# Patient Record
Sex: Female | Born: 1975 | Race: Black or African American | Hispanic: No | Smoking: Former smoker
Health system: Southern US, Community
[De-identification: ages and names within clinical notes are randomized; demographics above are authoritative.]

## PROBLEM LIST (undated history)

## (undated) DIAGNOSIS — H269 Unspecified cataract: Secondary | ICD-10-CM

## (undated) DIAGNOSIS — M25571 Pain in right ankle and joints of right foot: Secondary | ICD-10-CM

## (undated) DIAGNOSIS — E282 Polycystic ovarian syndrome: Secondary | ICD-10-CM

## (undated) DIAGNOSIS — K5900 Constipation, unspecified: Secondary | ICD-10-CM

## (undated) DIAGNOSIS — J45901 Unspecified asthma with (acute) exacerbation: Secondary | ICD-10-CM

## (undated) DIAGNOSIS — T4145XA Adverse effect of unspecified anesthetic, initial encounter: Secondary | ICD-10-CM

## (undated) DIAGNOSIS — Z9989 Dependence on other enabling machines and devices: Principal | ICD-10-CM

## (undated) DIAGNOSIS — T8859XA Other complications of anesthesia, initial encounter: Secondary | ICD-10-CM

## (undated) DIAGNOSIS — R600 Localized edema: Secondary | ICD-10-CM

## (undated) DIAGNOSIS — K219 Gastro-esophageal reflux disease without esophagitis: Secondary | ICD-10-CM

## (undated) DIAGNOSIS — M25511 Pain in right shoulder: Secondary | ICD-10-CM

## (undated) DIAGNOSIS — R739 Hyperglycemia, unspecified: Secondary | ICD-10-CM

## (undated) DIAGNOSIS — R51 Headache: Secondary | ICD-10-CM

## (undated) DIAGNOSIS — H35039 Hypertensive retinopathy, unspecified eye: Secondary | ICD-10-CM

## (undated) DIAGNOSIS — M25551 Pain in right hip: Secondary | ICD-10-CM

## (undated) DIAGNOSIS — J4551 Severe persistent asthma with (acute) exacerbation: Secondary | ICD-10-CM

## (undated) DIAGNOSIS — M1990 Unspecified osteoarthritis, unspecified site: Secondary | ICD-10-CM

## (undated) DIAGNOSIS — N920 Excessive and frequent menstruation with regular cycle: Secondary | ICD-10-CM

## (undated) DIAGNOSIS — Z6841 Body Mass Index (BMI) 40.0 and over, adult: Secondary | ICD-10-CM

## (undated) DIAGNOSIS — G4733 Obstructive sleep apnea (adult) (pediatric): Secondary | ICD-10-CM

## (undated) DIAGNOSIS — Z8489 Family history of other specified conditions: Secondary | ICD-10-CM

## (undated) DIAGNOSIS — M069 Rheumatoid arthritis, unspecified: Secondary | ICD-10-CM

## (undated) DIAGNOSIS — A6000 Herpesviral infection of urogenital system, unspecified: Secondary | ICD-10-CM

## (undated) DIAGNOSIS — E662 Morbid (severe) obesity with alveolar hypoventilation: Secondary | ICD-10-CM

## (undated) DIAGNOSIS — E11319 Type 2 diabetes mellitus with unspecified diabetic retinopathy without macular edema: Secondary | ICD-10-CM

## (undated) DIAGNOSIS — M545 Low back pain: Secondary | ICD-10-CM

## (undated) DIAGNOSIS — L853 Xerosis cutis: Secondary | ICD-10-CM

## (undated) DIAGNOSIS — J302 Other seasonal allergic rhinitis: Secondary | ICD-10-CM

## (undated) DIAGNOSIS — G5601 Carpal tunnel syndrome, right upper limb: Secondary | ICD-10-CM

## (undated) HISTORY — DX: Hypertensive retinopathy, unspecified eye: H35.039

## (undated) HISTORY — DX: Polycystic ovarian syndrome: E28.2

## (undated) HISTORY — DX: Type 2 diabetes mellitus with unspecified diabetic retinopathy without macular edema: E11.319

## (undated) HISTORY — DX: Xerosis cutis: L85.3

## (undated) HISTORY — DX: Pain in right shoulder: M25.511

## (undated) HISTORY — DX: Morbid (severe) obesity due to excess calories: E66.01

## (undated) HISTORY — DX: Excessive and frequent menstruation with regular cycle: N92.0

## (undated) HISTORY — DX: Obstructive sleep apnea (adult) (pediatric): G47.33

## (undated) HISTORY — DX: Headache: R51

## (undated) HISTORY — DX: Dependence on other enabling machines and devices: Z99.89

## (undated) HISTORY — DX: Herpesviral infection of urogenital system, unspecified: A60.00

## (undated) HISTORY — DX: Rheumatoid arthritis, unspecified: M06.9

## (undated) HISTORY — DX: Low back pain: M54.5

## (undated) HISTORY — DX: Unspecified asthma with (acute) exacerbation: J45.901

## (undated) HISTORY — DX: Gastro-esophageal reflux disease without esophagitis: K21.9

## (undated) HISTORY — DX: Body Mass Index (BMI) 40.0 and over, adult: Z684

## (undated) HISTORY — DX: Severe persistent asthma with (acute) exacerbation: J45.51

## (undated) HISTORY — DX: Pain in right hip: M25.551

## (undated) HISTORY — DX: Unspecified cataract: H26.9

## (undated) HISTORY — DX: Morbid (severe) obesity with alveolar hypoventilation: E66.2

## (undated) HISTORY — DX: Constipation, unspecified: K59.00

## (undated) HISTORY — DX: Hyperglycemia, unspecified: R73.9

## (undated) HISTORY — DX: Localized edema: R60.0

## (undated) HISTORY — DX: Pain in right ankle and joints of right foot: M25.571

## (undated) NOTE — *Deleted (*Deleted)
[facility] - Clinic Note  01/11/2020     CHIEF COMPLAINT Patient presents for No chief complaint on file.   HISTORY OF PRESENT ILLNESS: Ana Long is a 1 y.o. female who presents to the clinic today for:   pt is delayed to follow up from 4 weeks to 3.5 months, pt state she just forgot about her last appt, pt states she think she needs new glasses  Referring physician: Olivia Canter MD [address] [place], [place] [address]  HISTORICAL INFORMATION:   Selected notes from the MEDICAL RECORD NUMBER Re-referred by Dr. Fabian Sharp for DM exam    CURRENT MEDICATIONS: No current outpatient medications on file. (Ophthalmic Drugs)   No current facility-administered medications for this visit. (Ophthalmic Drugs)   Current Outpatient Medications (Other)  Medication Sig  . acyclovir (ZOVIRAX) 400 MG tablet TAKE 1 TABLET BY MOUTH TWICE A DAY  . albuterol (PROAIR HFA) 108 (90 Base) MCG/ACT inhaler Inhale 2 puffs into the lungs every 4 (four) hours as needed for wheezing or shortness of breath (cough). (Patient taking differently: Inhale 2 puffs into the lungs every 4 (four) hours as needed for wheezing or shortness of breath (or coughing). )  . atorvastatin (LIPITOR) 40 MG tablet Take 1 tablet (40 mg total) by mouth at bedtime.  . benzonatate (TESSALON) 200 MG capsule Take 1 capsule (200 mg total) by mouth 3 (three) times daily as needed for cough.  . Blood Glucose Monitoring Suppl (ACCU-CHEK AVIVA PLUS) w/Device KIT Please check you blood sugars 4 times a day  . dapagliflozin propanediol (FARXIGA) 5 MG TABS tablet Take 5 mg by mouth daily. (Patient taking differently: Take 5 mg by mouth 2 (two) times a day. )  . fluconazole (DIFLUCAN) 150 MG tablet TAKE 1 TABLET BY MOUTH NOW. THEN REPEAT IN 1 WEEK  . FLUoxetine (PROZAC) 20 MG capsule Take by mouth.  . furosemide (LASIX) 20 MG tablet Take 20 mg by mouth daily as needed for fluid or edema.   Marland Kitchen glucose blood  (ACCU-CHEK SMARTVIEW) test strip TEST FOUR TIMES DAILY  . hydrochlorothiazide (HYDRODIURIL) 12.5 MG tablet Take by mouth.  . insulin aspart (NOVOLOG) 100 UNIT/ML injection Inject 0-20 Units into the skin every 4 (four) hours. CBG 70 - 120: 0 units  CBG 121 - 150: 3 units  CBG 151 - 200: 4 units  CBG 201 - 250: 7 units  CBG 251 - 300: 11 units  CBG 301 - 350: 15 units  CBG 351 - 400: 20 units  CBG > 400 call MD (Patient taking differently: Inject 20 Units into the skin every 4 (four) hours. CBG 70 - 120: 0 units  CBG 121 - 150: 3 units  CBG 151 - 200: 4 units  CBG 201 - 250: 7 units  CBG 251 - 300: 11 units  CBG 301 - 350: 15 units  CBG 351 - 400: 20 units  CBG > 400 call MD)  . insulin aspart protamine - aspart (NOVOLOG MIX 70/30 FLEXPEN) (70-30) 100 UNIT/ML FlexPen Inject into the skin.  . Insulin Glargine (LANTUS) 100 UNIT/ML Solostar Pen Inject 80 Units into the skin 2 (two) times daily.  . Insulin Pen Needle (B-D UF III MINI PEN NEEDLES) 31G X 5 MM MISC USE AS DIRECTED TO INJECT INSULIN FIVE A DAY E11.65  . JANUVIA 100 MG tablet TAKE 1 TABLET BY MOUTH EVERY DAY  . lactulose (CEPHULAC) 10 g packet Take 1 packet (10 g  total) by mouth daily as needed. Reported on 07/29/2015 (Patient taking differently: Take 10 g by mouth daily as needed (for constipation). )  . Lancets (ACCU-CHEK SOFT TOUCH) lancets Use as instructed  . liraglutide (VICTOZA) 18 MG/3ML SOPN Take 0.6mg  daily for 1 week, then 1.2mg  daily for 1 week, then 1.8mg  daily.  Marland Kitchen loratadine (CLARITIN) 10 MG tablet Take 1 tablet (10 mg total) by mouth 2 (two) times daily.  . methocarbamol (ROBAXIN) 500 MG tablet Take 1 tablet (500 mg total) by mouth every 8 (eight) hours as needed. (Patient taking differently: Take 500 mg by mouth every 8 (eight) hours as needed for muscle spasms. )  . mometasone-formoterol (DULERA) 200-5 MCG/ACT AERO Inhale 2 puffs into the lungs 2 (two) times daily.  . montelukast (SINGULAIR) 10 MG tablet Take 1 tablet  (10 mg total) by mouth at bedtime.  . nitroGLYCERIN (NITRODUR - DOSED IN MG/24 HR) 0.2 mg/hr patch Apply 1/4th patch to affected achilles, change daily  . omeprazole (PRILOSEC) 20 MG capsule Take 1 capsule (20 mg total) by mouth daily before breakfast.  . topiramate (TOPAMAX) 25 MG tablet Take by mouth.   Current Facility-Administered Medications (Other)  Medication Route  . diphenhydrAMINE (BENADRYL) injection 12.5 mg Intramuscular      REVIEW OF SYSTEMS:    ALLERGIES Allergies  Allergen Reactions  . Bee Venom Anaphylaxis and Swelling  . Wasp Venom Anaphylaxis and Swelling  . Citrus Hives and Itching  . Latex Hives and Swelling  . Penicillins Hives and Itching    Has patient had a PCN reaction causing immediate rash, facial/tongue/throat swelling, SOB or lightheadedness with hypotension: Yes Has patient had a PCN reaction causing severe rash involving mucus membranes or skin necrosis: Unk Has patient had a PCN reaction that required hospitalization: Was already in hosp Has patient had a PCN reaction occurring within the last 10 years: No If all of the above answers are "NO", then may proceed with Cephalosporin use.   . Shellfish Allergy Other (See Comments) and Swelling    Patient is uncertain; stated that she tolerates this (??)  . Soap Hives, Itching and Swelling    PUREX LAUNDRY DETERGENT  . Tomato Hives and Itching  . Banana Itching and Nausea Only  . Chocolate Itching  . Fluticasone Other (See Comments)    Per patient, makes sneeze and gag  . Hydrocodone Itching and Nausea Only  . Other Itching    Acidic foods- Itching [place]- Itching in the roof of the mouth  . Adhesive [Tape] Itching and Rash    Allergic to  "leads"  . Doxycycline Other (See Comments)    AFFECTS JOINTS  . Metformin And Related Nausea Only, Rash, Other (See Comments) and Diarrhea    GI UPSET, also  . Red Dye Itching, Rash and Other (See Comments)    At eye doctor's office, the dilating drops-  Skin breaks out, chest turned red, itching, face became swollen    PAST MEDICAL HISTORY Past Medical History:  Diagnosis Date  . Acute nonintractable headache 12/26/2017  . Arthritis    major joints  . Asthma    prn inhaler  . Asthma exacerbation 07/25/2013  . Asthma in adult, severe persistent, with acute exacerbation 01/11/2018  . Carpal tunnel syndrome of right wrist 09/2012  . Cataract    NS OU  . Complication of anesthesia    states is hard to wake up post-op  . Constipation 12/30/2014  . Diabetes mellitus    intolerant to Metformin  .  Diabetic retinopathy (HCC)    NPDR OU  . Dry skin    hands   . Family history of anesthesia complication    mother went into coma during c-section  . Genital herpes 01/01/2006   on acyclovir BID   . GERD (gastroesophageal reflux disease)   . Herpes genital   . Hyperglycemia 12/26/2017  . Hypertensive retinopathy    OU  . Low back pain 11/22/2012  . Lower extremity edema 10/18/2012  . Menorrhagia 11/06/2015  . Morbid obesity with BMI of 50 to 60 07/23/2009  . Obesity hypoventilation syndrome (HCC) 10/30/2012  . Obesity, morbid (HCC) 01/10/2014  . OSA on CPAP     AHI was on  11-17-12 to 120/hr, titrated to 15 cm with 3 cm EPR/   . PCOS (polycystic ovarian syndrome)   . Rheumatoid arthritis (HCC)   . Right ankle pain 06/19/2015  . Right hip pain 09/04/2017  . Right shoulder pain 06/01/2017  . Seasonal allergies   . Sleep apnea, obstructive    uses CPAP nightly - had sleep study 08/22/2007   Past Surgical History:  Procedure Laterality Date  . CARPAL TUNNEL RELEASE Right 10/02/2012   Procedure: RIGHT CARPAL TUNNEL RELEASE ENDOSCOPIC;  Surgeon: Jodi Marble, MD;  Location: [facility];  Service: Orthopedics;  Laterality: Right;  . CHOLECYSTECTOMY N/A 12/02/2014   Procedure: LAPAROSCOPIC CHOLECYSTECTOMY;  Surgeon: Darnell Level, MD;  Location: [facility];  Service: General;  Laterality: N/A;  . HYSTEROSCOPY WITH D & C  01-08-2002     FAMILY HISTORY Family History  Problem Relation Age of Onset  . Other Mother   . Leukemia Cousin   . Other Other   . Obesity Other   . Other Other   . Diabetes Maternal Aunt   . Heart attack Maternal Aunt   . Alzheimer's disease Maternal Uncle   . Cancer Maternal Uncle        prostate  . Diabetes Paternal Aunt   . Cancer Paternal Aunt        brain,mouth  . Alzheimer's disease Paternal Aunt   . Diabetes Paternal Uncle   . Diabetes Maternal Grandmother   . Diabetes Maternal Grandfather     SOCIAL HISTORY Social History   Tobacco Use  . Smoking status: Former Smoker    Years: 10.00    Quit date: 05/24/2010    Years since quitting: 9.6  . Smokeless tobacco: Never Used  Vaping Use  . Vaping Use: Never used  Substance Use Topics  . Alcohol use: Yes    Alcohol/week: 0.0 standard drinks    Comment: socially  . Drug use: No         OPHTHALMIC EXAM:  Not recorded     IMAGING AND PROCEDURES  Imaging and Procedures for @TODAY @           ASSESSMENT/PLAN:    ICD-10-CM   1. Moderate nonproliferative diabetic retinopathy of both eyes with macular edema associated with type 2 diabetes mellitus (HCC)  Z61.0960   2. Retinal edema  H35.81 OCT, Retina - OU - Both Eyes  3. Essential hypertension  I10   4. Hypertensive retinopathy of both eyes  H35.033   5. Nuclear sclerosis of both eyes  H25.13     1,2. Moderate non-proliferative diabetic retinopathy w/ DME, OU  - delayed f/u from 4 wks to 3.5 mos (07.02.21 to 10.15.21) -- discussed importance of regular follow up and treatments  - pt lost to f/u from 10.23.2019 to 11.18.20  -  FA in 2019 showed leaking MA OU; no NV OU  **of note, pt had pruritic rxn to fluorescein dye -- resolved with 12.5 mg IV benadryl**  - FA 12.16.2020 shows late leaking MA's, non-perfusion defects; no NV OU  - s/p IVA OD #1 (12.16.20), #2 (01.25.21), #3 (05.26.21), #4 (07.02.21), #5 *10.15.21)  - s/p focal laser OD (03.19.21)  - s/p IVA OS #1  OS (12.23.20), #2 (01.27.21), #3 (03.02.21), #4 (05.26.21),#5 (07.02.21), #6 (10.15.21)  - exam shows scattered MA, IRH, exudates and mild macular edema OU  - OCT shows interval improvement in IRF/IRHM OD; OS: interval increase in IRF nasal fovea, but interval improvement in IRF IT macula   - BCVA OD stable at 20/25; OS decreased to 20/30 from 20/25             - Recommend IVA #6 OD and #7 OS today, 11.19.21             - Risks and benefits of tx discussed w/pt             - Pt wishes to proceed w/tx today             - informed consent obtained  - [place] informed consent form signed and scanned on 01.27.2021             - see procedure note  - f/u 4-6 weeks, DFE, OCT  3,4. Hypertensive retinopathy OU  - discussed importance of tight BP control  - monitor  5. Nuclear Sclerosis   - The symptoms of cataract, surgical options, and treatments and risks were discussed with patient.  - discussed diagnosis and progression  - under the expert management of [facility]   Ophthalmic Meds Ordered this visit:  No orders of the defined types were placed in this encounter.      No follow-ups on file.  There are no Patient Instructions on file for this visit.   Explained the diagnoses, plan, and follow up with the patient and they expressed understanding.  Patient expressed understanding of the importance of proper follow up care.  This document serves as a record of services personally performed by Karie Chimera, MD, PhD. It was created on their behalf by Glee Arvin. Manson Passey, OA an ophthalmic technician. The creation of this record is the provider's dictation and/or activities during the visit.    Electronically signed by: Glee Arvin. Manson Passey, [place] 11.17.2021 12:51 PM   Karie Chimera, M.D., Ph.D. Diseases & Surgery of the Retina and Vitreous [facility]    Abbreviations: M myopia (nearsighted); A astigmatism; H hyperopia (farsighted); P presbyopia; Mrx spectacle  prescription;  CTL contact lenses; OD right eye; OS left eye; OU both eyes  XT exotropia; ET esotropia; PEK punctate epithelial keratitis; PEE punctate epithelial erosions; DES dry eye syndrome; MGD meibomian gland dysfunction; ATs artificial tears; PFAT's preservative free artificial tears; NSC nuclear sclerotic cataract; PSC posterior subcapsular cataract; ERM epi-retinal membrane; PVD posterior vitreous detachment; RD retinal detachment; DM diabetes mellitus; DR diabetic retinopathy; NPDR non-proliferative diabetic retinopathy; PDR proliferative diabetic retinopathy; CSME clinically significant macular edema; DME diabetic macular edema; dbh dot blot hemorrhages; CWS cotton wool spot; POAG primary open angle glaucoma; C/D cup-to-disc ratio; HVF humphrey visual field; GVF goldmann visual field; OCT optical coherence tomography; IOP intraocular pressure; BRVO Branch retinal vein occlusion; CRVO central retinal vein occlusion; CRAO central retinal artery occlusion; BRAO branch retinal artery occlusion; RT retinal tear; SB scleral buckle; PPV pars  plana vitrectomy; VH Vitreous hemorrhage; PRP panretinal laser photocoagulation; IVK intravitreal kenalog; VMT vitreomacular traction; MH Macular hole;  NVD neovascularization of the disc; NVE neovascularization elsewhere; AREDS age related eye disease study; ARMD age related macular degeneration; POAG primary open angle glaucoma; EBMD epithelial/anterior basement membrane dystrophy; ACIOL anterior chamber intraocular lens; IOL intraocular lens; PCIOL posterior chamber intraocular lens; Phaco/IOL phacoemulsification with intraocular lens placement; [place] photorefractive keratectomy; LASIK laser assisted in situ keratomileusis; HTN hypertension; DM diabetes mellitus; COPD chronic obstructive pulmonary disease

---

## 1997-05-28 ENCOUNTER — Encounter: Admission: RE | Admit: 1997-05-28 | Discharge: 1997-05-28 | Payer: Self-pay | Admitting: Obstetrics & Gynecology

## 1997-07-02 ENCOUNTER — Encounter: Admission: RE | Admit: 1997-07-02 | Discharge: 1997-07-02 | Payer: Self-pay | Admitting: Obstetrics & Gynecology

## 1997-11-04 ENCOUNTER — Emergency Department (HOSPITAL_COMMUNITY): Admission: EM | Admit: 1997-11-04 | Discharge: 1997-11-05 | Payer: Self-pay | Admitting: Emergency Medicine

## 1998-03-04 ENCOUNTER — Encounter: Admission: RE | Admit: 1998-03-04 | Discharge: 1998-03-04 | Payer: Self-pay | Admitting: Obstetrics & Gynecology

## 1998-04-29 ENCOUNTER — Encounter: Admission: RE | Admit: 1998-04-29 | Discharge: 1998-04-29 | Payer: Self-pay | Admitting: Obstetrics & Gynecology

## 1998-05-27 ENCOUNTER — Encounter: Admission: RE | Admit: 1998-05-27 | Discharge: 1998-08-25 | Payer: Self-pay | Admitting: Obstetrics & Gynecology

## 1998-09-02 ENCOUNTER — Encounter: Admission: RE | Admit: 1998-09-02 | Discharge: 1998-09-02 | Payer: Self-pay | Admitting: Obstetrics & Gynecology

## 1998-11-04 ENCOUNTER — Encounter: Admission: RE | Admit: 1998-11-04 | Discharge: 1998-11-04 | Payer: Self-pay | Admitting: Obstetrics & Gynecology

## 1998-11-13 ENCOUNTER — Encounter: Admission: RE | Admit: 1998-11-13 | Discharge: 1999-02-11 | Payer: Self-pay | Admitting: Obstetrics & Gynecology

## 1998-11-18 ENCOUNTER — Encounter: Admission: RE | Admit: 1998-11-18 | Discharge: 1998-11-18 | Payer: Self-pay | Admitting: Obstetrics & Gynecology

## 1998-12-16 ENCOUNTER — Encounter: Admission: RE | Admit: 1998-12-16 | Discharge: 1998-12-16 | Payer: Self-pay | Admitting: Obstetrics & Gynecology

## 1998-12-26 ENCOUNTER — Encounter: Admission: RE | Admit: 1998-12-26 | Discharge: 1998-12-26 | Payer: Self-pay | Admitting: Obstetrics & Gynecology

## 1999-04-08 ENCOUNTER — Inpatient Hospital Stay (HOSPITAL_COMMUNITY): Admission: AD | Admit: 1999-04-08 | Discharge: 1999-04-08 | Payer: Self-pay | Admitting: Obstetrics

## 1999-11-04 ENCOUNTER — Emergency Department (HOSPITAL_COMMUNITY): Admission: EM | Admit: 1999-11-04 | Discharge: 1999-11-04 | Payer: Self-pay | Admitting: *Deleted

## 1999-11-06 ENCOUNTER — Emergency Department (HOSPITAL_COMMUNITY): Admission: EM | Admit: 1999-11-06 | Discharge: 1999-11-06 | Payer: Self-pay | Admitting: Emergency Medicine

## 1999-11-06 ENCOUNTER — Encounter: Payer: Self-pay | Admitting: Emergency Medicine

## 1999-12-09 ENCOUNTER — Encounter: Admission: RE | Admit: 1999-12-09 | Discharge: 2000-01-20 | Payer: Self-pay | Admitting: Family Medicine

## 2000-05-24 ENCOUNTER — Encounter: Admission: RE | Admit: 2000-05-24 | Discharge: 2000-05-24 | Payer: Self-pay | Admitting: Obstetrics & Gynecology

## 2000-05-30 ENCOUNTER — Encounter: Admission: RE | Admit: 2000-05-30 | Discharge: 2000-05-30 | Payer: Self-pay | Admitting: Internal Medicine

## 2000-06-30 ENCOUNTER — Ambulatory Visit (HOSPITAL_BASED_OUTPATIENT_CLINIC_OR_DEPARTMENT_OTHER): Admission: RE | Admit: 2000-06-30 | Discharge: 2000-06-30 | Payer: Self-pay

## 2000-07-18 ENCOUNTER — Encounter: Admission: RE | Admit: 2000-07-18 | Discharge: 2000-07-18 | Payer: Self-pay | Admitting: Internal Medicine

## 2000-08-12 ENCOUNTER — Encounter: Admission: RE | Admit: 2000-08-12 | Discharge: 2000-08-12 | Payer: Self-pay | Admitting: Internal Medicine

## 2000-08-31 ENCOUNTER — Ambulatory Visit (HOSPITAL_BASED_OUTPATIENT_CLINIC_OR_DEPARTMENT_OTHER): Admission: RE | Admit: 2000-08-31 | Discharge: 2000-08-31 | Payer: Self-pay | Admitting: *Deleted

## 2000-09-26 ENCOUNTER — Encounter: Admission: RE | Admit: 2000-09-26 | Discharge: 2000-09-26 | Payer: Self-pay | Admitting: Internal Medicine

## 2000-10-26 ENCOUNTER — Encounter: Admission: RE | Admit: 2000-10-26 | Discharge: 2000-10-26 | Payer: Self-pay | Admitting: Internal Medicine

## 2000-11-10 ENCOUNTER — Ambulatory Visit (HOSPITAL_COMMUNITY): Admission: RE | Admit: 2000-11-10 | Discharge: 2000-11-10 | Payer: Self-pay

## 2000-12-31 ENCOUNTER — Encounter: Payer: Self-pay | Admitting: Emergency Medicine

## 2000-12-31 ENCOUNTER — Emergency Department (HOSPITAL_COMMUNITY): Admission: EM | Admit: 2000-12-31 | Discharge: 2000-12-31 | Payer: Self-pay | Admitting: Emergency Medicine

## 2001-01-11 ENCOUNTER — Encounter: Admission: RE | Admit: 2001-01-11 | Discharge: 2001-01-11 | Payer: Self-pay

## 2001-02-28 ENCOUNTER — Encounter: Admission: RE | Admit: 2001-02-28 | Discharge: 2001-02-28 | Payer: Self-pay | Admitting: Obstetrics & Gynecology

## 2001-02-28 ENCOUNTER — Inpatient Hospital Stay (HOSPITAL_COMMUNITY): Admission: AD | Admit: 2001-02-28 | Discharge: 2001-02-28 | Payer: Self-pay | Admitting: *Deleted

## 2001-04-03 ENCOUNTER — Encounter: Admission: RE | Admit: 2001-04-03 | Discharge: 2001-04-03 | Payer: Self-pay | Admitting: Internal Medicine

## 2001-05-02 ENCOUNTER — Ambulatory Visit (HOSPITAL_COMMUNITY): Admission: RE | Admit: 2001-05-02 | Discharge: 2001-05-02 | Payer: Self-pay | Admitting: *Deleted

## 2001-05-02 ENCOUNTER — Encounter: Admission: RE | Admit: 2001-05-02 | Discharge: 2001-05-02 | Payer: Self-pay | Admitting: Internal Medicine

## 2001-05-02 ENCOUNTER — Encounter: Payer: Self-pay | Admitting: *Deleted

## 2001-08-14 ENCOUNTER — Encounter: Admission: RE | Admit: 2001-08-14 | Discharge: 2001-08-14 | Payer: Self-pay | Admitting: Internal Medicine

## 2001-08-22 ENCOUNTER — Encounter: Admission: RE | Admit: 2001-08-22 | Discharge: 2001-08-22 | Payer: Self-pay | Admitting: *Deleted

## 2001-09-13 ENCOUNTER — Encounter: Admission: RE | Admit: 2001-09-13 | Discharge: 2001-09-13 | Payer: Self-pay | Admitting: Internal Medicine

## 2001-10-19 ENCOUNTER — Encounter: Admission: RE | Admit: 2001-10-19 | Discharge: 2001-10-19 | Payer: Self-pay | Admitting: Internal Medicine

## 2001-11-16 ENCOUNTER — Encounter: Admission: RE | Admit: 2001-11-16 | Discharge: 2001-11-16 | Payer: Self-pay | Admitting: Obstetrics and Gynecology

## 2002-01-08 ENCOUNTER — Ambulatory Visit (HOSPITAL_COMMUNITY): Admission: RE | Admit: 2002-01-08 | Discharge: 2002-01-08 | Payer: Self-pay | Admitting: Obstetrics and Gynecology

## 2002-01-08 ENCOUNTER — Encounter (INDEPENDENT_AMBULATORY_CARE_PROVIDER_SITE_OTHER): Payer: Self-pay | Admitting: Specialist

## 2002-01-08 HISTORY — PX: HYSTEROSCOPY WITH D & C: SHX1775

## 2002-01-25 ENCOUNTER — Encounter: Admission: RE | Admit: 2002-01-25 | Discharge: 2002-01-25 | Payer: Self-pay | Admitting: Obstetrics and Gynecology

## 2002-02-27 ENCOUNTER — Encounter: Admission: RE | Admit: 2002-02-27 | Discharge: 2002-02-27 | Payer: Self-pay | Admitting: Internal Medicine

## 2002-03-07 ENCOUNTER — Ambulatory Visit (HOSPITAL_COMMUNITY): Admission: RE | Admit: 2002-03-07 | Discharge: 2002-03-07 | Payer: Self-pay | Admitting: Internal Medicine

## 2002-04-26 ENCOUNTER — Encounter: Admission: RE | Admit: 2002-04-26 | Discharge: 2002-04-26 | Payer: Self-pay | Admitting: Internal Medicine

## 2002-05-08 ENCOUNTER — Encounter: Admission: RE | Admit: 2002-05-08 | Discharge: 2002-05-08 | Payer: Self-pay | Admitting: Obstetrics and Gynecology

## 2002-07-10 ENCOUNTER — Inpatient Hospital Stay (HOSPITAL_COMMUNITY): Admission: AD | Admit: 2002-07-10 | Discharge: 2002-07-10 | Payer: Self-pay | Admitting: *Deleted

## 2002-07-11 ENCOUNTER — Encounter: Admission: RE | Admit: 2002-07-11 | Discharge: 2002-07-11 | Payer: Self-pay | Admitting: Internal Medicine

## 2002-08-23 ENCOUNTER — Ambulatory Visit (HOSPITAL_COMMUNITY): Admission: RE | Admit: 2002-08-23 | Discharge: 2002-08-23 | Payer: Self-pay | Admitting: Gastroenterology

## 2002-09-13 ENCOUNTER — Encounter: Admission: RE | Admit: 2002-09-13 | Discharge: 2002-09-13 | Payer: Self-pay | Admitting: Obstetrics and Gynecology

## 2002-10-18 ENCOUNTER — Encounter: Admission: RE | Admit: 2002-10-18 | Discharge: 2002-10-18 | Payer: Self-pay | Admitting: Family Medicine

## 2002-10-19 ENCOUNTER — Encounter: Admission: RE | Admit: 2002-10-19 | Discharge: 2002-10-19 | Payer: Self-pay | Admitting: Internal Medicine

## 2002-11-15 ENCOUNTER — Encounter: Admission: RE | Admit: 2002-11-15 | Discharge: 2002-11-15 | Payer: Self-pay | Admitting: Obstetrics and Gynecology

## 2002-11-18 ENCOUNTER — Ambulatory Visit (HOSPITAL_BASED_OUTPATIENT_CLINIC_OR_DEPARTMENT_OTHER): Admission: RE | Admit: 2002-11-18 | Discharge: 2002-11-18 | Payer: Self-pay | Admitting: Internal Medicine

## 2002-11-26 ENCOUNTER — Ambulatory Visit (HOSPITAL_COMMUNITY): Admission: RE | Admit: 2002-11-26 | Discharge: 2002-11-26 | Payer: Self-pay | Admitting: Internal Medicine

## 2002-11-26 ENCOUNTER — Encounter: Payer: Self-pay | Admitting: Internal Medicine

## 2002-12-18 ENCOUNTER — Encounter: Admission: RE | Admit: 2002-12-18 | Discharge: 2002-12-18 | Payer: Self-pay | Admitting: Internal Medicine

## 2003-03-08 ENCOUNTER — Encounter: Admission: RE | Admit: 2003-03-08 | Discharge: 2003-03-08 | Payer: Self-pay | Admitting: Internal Medicine

## 2003-03-08 ENCOUNTER — Ambulatory Visit (HOSPITAL_COMMUNITY): Admission: RE | Admit: 2003-03-08 | Discharge: 2003-03-08 | Payer: Self-pay | Admitting: Internal Medicine

## 2003-04-11 ENCOUNTER — Encounter: Admission: RE | Admit: 2003-04-11 | Discharge: 2003-04-11 | Payer: Self-pay | Admitting: Internal Medicine

## 2003-05-30 ENCOUNTER — Encounter: Admission: RE | Admit: 2003-05-30 | Discharge: 2003-05-30 | Payer: Self-pay | Admitting: Obstetrics and Gynecology

## 2003-06-03 ENCOUNTER — Ambulatory Visit (HOSPITAL_COMMUNITY): Admission: RE | Admit: 2003-06-03 | Discharge: 2003-06-03 | Payer: Self-pay | Admitting: Obstetrics and Gynecology

## 2003-06-07 ENCOUNTER — Encounter: Admission: RE | Admit: 2003-06-07 | Discharge: 2003-06-07 | Payer: Self-pay | Admitting: Internal Medicine

## 2003-06-19 ENCOUNTER — Encounter: Admission: RE | Admit: 2003-06-19 | Discharge: 2003-06-19 | Payer: Self-pay | Admitting: Internal Medicine

## 2003-08-09 ENCOUNTER — Encounter: Admission: RE | Admit: 2003-08-09 | Discharge: 2003-08-09 | Payer: Self-pay | Admitting: Internal Medicine

## 2004-02-12 ENCOUNTER — Ambulatory Visit: Payer: Self-pay | Admitting: Internal Medicine

## 2004-05-12 ENCOUNTER — Ambulatory Visit: Payer: Self-pay | Admitting: Internal Medicine

## 2004-05-14 ENCOUNTER — Ambulatory Visit: Payer: Self-pay | Admitting: Internal Medicine

## 2004-05-14 ENCOUNTER — Ambulatory Visit: Payer: Self-pay | Admitting: Obstetrics and Gynecology

## 2004-06-19 ENCOUNTER — Ambulatory Visit: Payer: Self-pay | Admitting: Internal Medicine

## 2004-06-19 ENCOUNTER — Ambulatory Visit (HOSPITAL_COMMUNITY): Admission: RE | Admit: 2004-06-19 | Discharge: 2004-06-19 | Payer: Self-pay | Admitting: Internal Medicine

## 2005-01-13 ENCOUNTER — Ambulatory Visit: Payer: Self-pay | Admitting: Internal Medicine

## 2005-01-17 ENCOUNTER — Emergency Department (HOSPITAL_COMMUNITY): Admission: EM | Admit: 2005-01-17 | Discharge: 2005-01-17 | Payer: Self-pay | Admitting: Emergency Medicine

## 2005-05-27 ENCOUNTER — Ambulatory Visit: Payer: Self-pay | Admitting: Obstetrics and Gynecology

## 2005-05-27 ENCOUNTER — Encounter: Payer: Self-pay | Admitting: Obstetrics and Gynecology

## 2005-05-31 ENCOUNTER — Ambulatory Visit: Payer: Self-pay | Admitting: Hospitalist

## 2005-06-09 ENCOUNTER — Ambulatory Visit: Payer: Self-pay | Admitting: Internal Medicine

## 2005-08-12 ENCOUNTER — Emergency Department (HOSPITAL_COMMUNITY): Admission: EM | Admit: 2005-08-12 | Discharge: 2005-08-12 | Payer: Self-pay | Admitting: Family Medicine

## 2005-09-10 ENCOUNTER — Ambulatory Visit: Payer: Self-pay | Admitting: Internal Medicine

## 2005-10-08 ENCOUNTER — Ambulatory Visit: Payer: Self-pay | Admitting: Internal Medicine

## 2005-12-03 ENCOUNTER — Ambulatory Visit: Payer: Self-pay | Admitting: Gynecology

## 2005-12-06 ENCOUNTER — Ambulatory Visit: Payer: Self-pay | Admitting: Internal Medicine

## 2006-01-01 DIAGNOSIS — K219 Gastro-esophageal reflux disease without esophagitis: Secondary | ICD-10-CM | POA: Insufficient documentation

## 2006-01-01 DIAGNOSIS — E785 Hyperlipidemia, unspecified: Secondary | ICD-10-CM

## 2006-01-01 DIAGNOSIS — G5600 Carpal tunnel syndrome, unspecified upper limb: Secondary | ICD-10-CM

## 2006-01-01 DIAGNOSIS — E1165 Type 2 diabetes mellitus with hyperglycemia: Secondary | ICD-10-CM

## 2006-01-01 DIAGNOSIS — A6000 Herpesviral infection of urogenital system, unspecified: Secondary | ICD-10-CM

## 2006-01-01 HISTORY — DX: Herpesviral infection of urogenital system, unspecified: A60.00

## 2006-01-27 ENCOUNTER — Ambulatory Visit (HOSPITAL_COMMUNITY): Admission: RE | Admit: 2006-01-27 | Discharge: 2006-01-27 | Payer: Self-pay | Admitting: Internal Medicine

## 2006-01-27 ENCOUNTER — Ambulatory Visit: Payer: Self-pay | Admitting: Internal Medicine

## 2006-01-27 ENCOUNTER — Encounter (INDEPENDENT_AMBULATORY_CARE_PROVIDER_SITE_OTHER): Payer: Self-pay | Admitting: *Deleted

## 2006-01-27 LAB — CONVERTED CEMR LAB
Basophils Absolute: 0 10*3/uL (ref 0.0–0.1)
Basophils Relative: 0 % (ref 0–1)
MCHC: 32.4 g/dL — ABNORMAL LOW (ref 33.1–35.4)
MCV: 80.2 fL (ref 78.8–100.0)
Neutrophils Relative %: 58 % (ref 47–77)
Platelets: 322 10*3/uL (ref 152–374)
Streptococcus, Group A Screen (Direct): NEGATIVE
WBC: 7.7 10*3/uL (ref 3.7–10.0)

## 2006-04-05 ENCOUNTER — Encounter (INDEPENDENT_AMBULATORY_CARE_PROVIDER_SITE_OTHER): Payer: Self-pay | Admitting: *Deleted

## 2006-04-07 ENCOUNTER — Inpatient Hospital Stay (HOSPITAL_COMMUNITY): Admission: AD | Admit: 2006-04-07 | Discharge: 2006-04-07 | Payer: Self-pay | Admitting: Obstetrics and Gynecology

## 2006-04-14 ENCOUNTER — Ambulatory Visit: Payer: Self-pay | Admitting: Family Medicine

## 2006-04-27 ENCOUNTER — Encounter: Admission: RE | Admit: 2006-04-27 | Discharge: 2006-04-27 | Payer: Self-pay | Admitting: Cardiology

## 2006-05-03 ENCOUNTER — Ambulatory Visit: Payer: Self-pay | Admitting: Internal Medicine

## 2006-05-03 LAB — CONVERTED CEMR LAB
Blood Glucose, Fingerstick: 320
Hgb A1c MFr Bld: 10.1 %

## 2006-07-07 ENCOUNTER — Ambulatory Visit: Payer: Self-pay | Admitting: Obstetrics and Gynecology

## 2006-07-07 ENCOUNTER — Encounter: Payer: Self-pay | Admitting: Obstetrics and Gynecology

## 2006-09-01 ENCOUNTER — Encounter: Admission: RE | Admit: 2006-09-01 | Discharge: 2006-09-01 | Payer: Self-pay | Admitting: Cardiology

## 2006-09-19 ENCOUNTER — Emergency Department (HOSPITAL_COMMUNITY): Admission: EM | Admit: 2006-09-19 | Discharge: 2006-09-19 | Payer: Self-pay | Admitting: Emergency Medicine

## 2006-09-19 ENCOUNTER — Encounter: Payer: Self-pay | Admitting: Obstetrics & Gynecology

## 2006-11-24 ENCOUNTER — Telehealth (INDEPENDENT_AMBULATORY_CARE_PROVIDER_SITE_OTHER): Payer: Self-pay | Admitting: Internal Medicine

## 2006-12-16 ENCOUNTER — Telehealth (INDEPENDENT_AMBULATORY_CARE_PROVIDER_SITE_OTHER): Payer: Self-pay | Admitting: Internal Medicine

## 2007-01-09 ENCOUNTER — Ambulatory Visit: Payer: Self-pay | Admitting: Internal Medicine

## 2007-01-29 ENCOUNTER — Emergency Department (HOSPITAL_COMMUNITY): Admission: EM | Admit: 2007-01-29 | Discharge: 2007-01-29 | Payer: Self-pay | Admitting: Emergency Medicine

## 2007-03-03 ENCOUNTER — Ambulatory Visit: Payer: Self-pay | Admitting: Internal Medicine

## 2007-03-09 ENCOUNTER — Telehealth (INDEPENDENT_AMBULATORY_CARE_PROVIDER_SITE_OTHER): Payer: Self-pay | Admitting: *Deleted

## 2007-03-21 ENCOUNTER — Telehealth: Payer: Self-pay | Admitting: *Deleted

## 2007-03-29 ENCOUNTER — Ambulatory Visit (HOSPITAL_COMMUNITY): Admission: RE | Admit: 2007-03-29 | Discharge: 2007-03-29 | Payer: Self-pay | Admitting: Internal Medicine

## 2007-03-29 ENCOUNTER — Encounter (INDEPENDENT_AMBULATORY_CARE_PROVIDER_SITE_OTHER): Payer: Self-pay | Admitting: Infectious Diseases

## 2007-03-29 ENCOUNTER — Ambulatory Visit: Payer: Self-pay | Admitting: Hospitalist

## 2007-03-29 ENCOUNTER — Encounter: Admission: RE | Admit: 2007-03-29 | Discharge: 2007-03-29 | Payer: Self-pay | Admitting: Internal Medicine

## 2007-03-29 LAB — CONVERTED CEMR LAB
CO2: 29 meq/L (ref 19–32)
Calcium: 8.7 mg/dL (ref 8.4–10.5)
Creatinine, Ser: 0.63 mg/dL (ref 0.40–1.20)
Glucose, Bld: 217 mg/dL — ABNORMAL HIGH (ref 70–99)

## 2007-04-25 ENCOUNTER — Ambulatory Visit: Payer: Self-pay | Admitting: *Deleted

## 2007-05-10 ENCOUNTER — Ambulatory Visit: Payer: Self-pay | Admitting: Obstetrics & Gynecology

## 2007-05-18 ENCOUNTER — Encounter (INDEPENDENT_AMBULATORY_CARE_PROVIDER_SITE_OTHER): Payer: Self-pay | Admitting: Internal Medicine

## 2007-05-18 ENCOUNTER — Telehealth: Payer: Self-pay | Admitting: *Deleted

## 2007-05-18 ENCOUNTER — Ambulatory Visit: Payer: Self-pay | Admitting: Internal Medicine

## 2007-05-22 LAB — CONVERTED CEMR LAB
AST: 12 units/L (ref 0–37)
Albumin: 3.9 g/dL (ref 3.5–5.2)
Alkaline Phosphatase: 39 units/L (ref 39–117)
BUN: 10 mg/dL (ref 6–23)
Basophils Relative: 0 % (ref 0–1)
Eosinophils Absolute: 0.1 10*3/uL (ref 0.0–0.7)
Eosinophils Relative: 1 % (ref 0–5)
HDL: 51 mg/dL (ref >39)
LDL Cholesterol: 100 mg/dL — ABNORMAL HIGH (ref 0–99)
MCHC: 31.4 g/dL (ref 30.0–36.0)
MCV: 80.8 fL (ref 78.0–100.0)
Monocytes Relative: 7 % (ref 3–12)
Neutrophils Relative %: 58 % (ref 43–77)
Platelets: 328 10*3/uL (ref 150–400)
Potassium: 4.2 meq/L (ref 3.5–5.3)
TSH: 1.204 microintl units/mL (ref 0.350–5.50)
Total Bilirubin: 0.3 mg/dL (ref 0.3–1.2)
Total CHOL/HDL Ratio: 3.5
VLDL: 27 mg/dL (ref 0–40)

## 2007-05-24 ENCOUNTER — Encounter: Admission: RE | Admit: 2007-05-24 | Discharge: 2007-08-17 | Payer: Self-pay | Admitting: Sports Medicine

## 2007-07-11 ENCOUNTER — Ambulatory Visit: Payer: Self-pay | Admitting: Internal Medicine

## 2007-08-01 ENCOUNTER — Ambulatory Visit: Payer: Self-pay | Admitting: *Deleted

## 2007-08-01 LAB — CONVERTED CEMR LAB: Blood Glucose, Fingerstick: 148

## 2007-08-02 ENCOUNTER — Ambulatory Visit (HOSPITAL_COMMUNITY): Admission: RE | Admit: 2007-08-02 | Discharge: 2007-08-02 | Payer: Self-pay | Admitting: *Deleted

## 2007-08-02 ENCOUNTER — Ambulatory Visit: Payer: Self-pay | Admitting: *Deleted

## 2007-08-02 ENCOUNTER — Encounter (INDEPENDENT_AMBULATORY_CARE_PROVIDER_SITE_OTHER): Payer: Self-pay | Admitting: Internal Medicine

## 2007-08-02 LAB — CONVERTED CEMR LAB: Microalb Creat Ratio: 7.9 mg/g (ref 0.0–30.0)

## 2007-08-10 ENCOUNTER — Observation Stay (HOSPITAL_COMMUNITY): Admission: AD | Admit: 2007-08-10 | Discharge: 2007-08-14 | Payer: Self-pay | Admitting: Hospitalist

## 2007-08-10 ENCOUNTER — Ambulatory Visit: Payer: Self-pay | Admitting: Internal Medicine

## 2007-08-14 ENCOUNTER — Ambulatory Visit: Payer: Self-pay | Admitting: Vascular Surgery

## 2007-08-14 ENCOUNTER — Encounter (INDEPENDENT_AMBULATORY_CARE_PROVIDER_SITE_OTHER): Payer: Self-pay | Admitting: Hospitalist

## 2007-08-14 ENCOUNTER — Encounter (INDEPENDENT_AMBULATORY_CARE_PROVIDER_SITE_OTHER): Payer: Self-pay | Admitting: Internal Medicine

## 2007-08-22 ENCOUNTER — Ambulatory Visit (HOSPITAL_BASED_OUTPATIENT_CLINIC_OR_DEPARTMENT_OTHER): Admission: RE | Admit: 2007-08-22 | Discharge: 2007-08-22 | Payer: Self-pay | Admitting: Internal Medicine

## 2007-08-22 ENCOUNTER — Encounter (INDEPENDENT_AMBULATORY_CARE_PROVIDER_SITE_OTHER): Payer: Self-pay | Admitting: Internal Medicine

## 2007-08-23 ENCOUNTER — Ambulatory Visit: Payer: Self-pay | Admitting: Internal Medicine

## 2007-08-23 LAB — CONVERTED CEMR LAB: Blood Glucose, Fingerstick: 154

## 2007-08-26 ENCOUNTER — Ambulatory Visit: Payer: Self-pay | Admitting: Internal Medicine

## 2007-09-08 ENCOUNTER — Ambulatory Visit: Payer: Self-pay | Admitting: Infectious Diseases

## 2007-09-08 LAB — CONVERTED CEMR LAB: Blood Glucose, Fingerstick: 179

## 2007-09-11 ENCOUNTER — Encounter: Admission: RE | Admit: 2007-09-11 | Discharge: 2007-09-28 | Payer: Self-pay | Admitting: Sports Medicine

## 2007-11-21 ENCOUNTER — Telehealth (INDEPENDENT_AMBULATORY_CARE_PROVIDER_SITE_OTHER): Payer: Self-pay | Admitting: Internal Medicine

## 2007-12-18 ENCOUNTER — Ambulatory Visit: Payer: Self-pay | Admitting: Internal Medicine

## 2008-02-07 ENCOUNTER — Emergency Department (HOSPITAL_COMMUNITY): Admission: EM | Admit: 2008-02-07 | Discharge: 2008-02-07 | Payer: Self-pay | Admitting: Emergency Medicine

## 2008-02-08 ENCOUNTER — Telehealth (INDEPENDENT_AMBULATORY_CARE_PROVIDER_SITE_OTHER): Payer: Self-pay | Admitting: Internal Medicine

## 2008-02-19 ENCOUNTER — Ambulatory Visit: Payer: Self-pay | Admitting: Infectious Diseases

## 2008-02-19 ENCOUNTER — Encounter (INDEPENDENT_AMBULATORY_CARE_PROVIDER_SITE_OTHER): Payer: Self-pay | Admitting: *Deleted

## 2008-02-19 LAB — CONVERTED CEMR LAB
Blood Glucose, Fingerstick: 166
Hemoglobin, Urine: NEGATIVE
Nitrite: NEGATIVE
Protein, ur: NEGATIVE mg/dL
Urobilinogen, UA: 1 (ref 0.0–1.0)

## 2008-02-20 ENCOUNTER — Encounter (INDEPENDENT_AMBULATORY_CARE_PROVIDER_SITE_OTHER): Payer: Self-pay | Admitting: *Deleted

## 2008-02-20 LAB — CONVERTED CEMR LAB
ALT: 13 units/L (ref 0–35)
CO2: 25 meq/L (ref 19–32)
Calcium: 8.9 mg/dL (ref 8.4–10.5)
Chloride: 102 meq/L (ref 96–112)
Creatinine, Ser: 0.49 mg/dL (ref 0.40–1.20)
Glucose, Bld: 159 mg/dL — ABNORMAL HIGH (ref 70–99)
TSH: 1.963 microintl units/mL (ref 0.350–4.50)
Total Bilirubin: 0.3 mg/dL (ref 0.3–1.2)
Vitamin B-12: 352 pg/mL (ref 211–911)

## 2008-03-07 ENCOUNTER — Encounter (INDEPENDENT_AMBULATORY_CARE_PROVIDER_SITE_OTHER): Payer: Self-pay | Admitting: Internal Medicine

## 2008-03-07 ENCOUNTER — Ambulatory Visit: Payer: Self-pay | Admitting: Internal Medicine

## 2008-03-07 LAB — CONVERTED CEMR LAB
Blood Glucose, Fingerstick: 190
Hgb A1c MFr Bld: 8.4 %

## 2008-03-13 ENCOUNTER — Encounter: Admission: RE | Admit: 2008-03-13 | Discharge: 2008-03-13 | Payer: Self-pay | Admitting: Sports Medicine

## 2008-03-28 ENCOUNTER — Ambulatory Visit: Payer: Self-pay | Admitting: Internal Medicine

## 2008-03-28 LAB — CONVERTED CEMR LAB
HCT: 37.5 % (ref 36.0–46.0)
HDL: 52 mg/dL (ref >39)
Hemoglobin: 11.3 g/dL — ABNORMAL LOW (ref 12.0–15.0)
LDL Cholesterol: 69 mg/dL (ref 0–99)
MCHC: 30.1 g/dL (ref 30.0–36.0)
Platelets: 333 10*3/uL (ref 150–400)
RDW: 16.8 % — ABNORMAL HIGH (ref 11.5–15.5)
Triglycerides: 103 mg/dL (ref <150)

## 2008-04-11 ENCOUNTER — Ambulatory Visit: Payer: Self-pay | Admitting: Obstetrics and Gynecology

## 2008-04-11 ENCOUNTER — Encounter: Payer: Self-pay | Admitting: Obstetrics and Gynecology

## 2008-04-11 LAB — HM PAP SMEAR

## 2008-04-12 ENCOUNTER — Encounter: Payer: Self-pay | Admitting: Obstetrics and Gynecology

## 2008-04-12 ENCOUNTER — Encounter: Payer: Self-pay | Admitting: Obstetrics & Gynecology

## 2008-04-12 LAB — CONVERTED CEMR LAB
Clue Cells Wet Prep HPF POC: NONE SEEN
HCV Ab: NEGATIVE
WBC, Wet Prep HPF POC: NONE SEEN
Yeast Wet Prep HPF POC: NONE SEEN

## 2008-04-17 ENCOUNTER — Telehealth: Payer: Self-pay | Admitting: *Deleted

## 2008-04-17 ENCOUNTER — Encounter: Payer: Self-pay | Admitting: Internal Medicine

## 2008-04-17 ENCOUNTER — Ambulatory Visit: Payer: Self-pay | Admitting: Internal Medicine

## 2008-04-17 DIAGNOSIS — J029 Acute pharyngitis, unspecified: Secondary | ICD-10-CM

## 2008-04-18 DIAGNOSIS — J020 Streptococcal pharyngitis: Secondary | ICD-10-CM | POA: Insufficient documentation

## 2008-04-25 ENCOUNTER — Encounter (INDEPENDENT_AMBULATORY_CARE_PROVIDER_SITE_OTHER): Payer: Self-pay | Admitting: Internal Medicine

## 2008-06-05 ENCOUNTER — Encounter (INDEPENDENT_AMBULATORY_CARE_PROVIDER_SITE_OTHER): Payer: Self-pay | Admitting: Internal Medicine

## 2008-06-24 ENCOUNTER — Telehealth (INDEPENDENT_AMBULATORY_CARE_PROVIDER_SITE_OTHER): Payer: Self-pay | Admitting: Internal Medicine

## 2008-07-08 ENCOUNTER — Encounter (INDEPENDENT_AMBULATORY_CARE_PROVIDER_SITE_OTHER): Payer: Self-pay | Admitting: Internal Medicine

## 2008-07-19 ENCOUNTER — Ambulatory Visit (HOSPITAL_COMMUNITY): Admission: RE | Admit: 2008-07-19 | Discharge: 2008-07-19 | Payer: Self-pay | Admitting: *Deleted

## 2008-07-19 ENCOUNTER — Encounter (INDEPENDENT_AMBULATORY_CARE_PROVIDER_SITE_OTHER): Payer: Self-pay | Admitting: Internal Medicine

## 2008-07-19 ENCOUNTER — Ambulatory Visit: Payer: Self-pay | Admitting: *Deleted

## 2008-07-19 LAB — CONVERTED CEMR LAB
Basophils Relative: 0 % (ref 0–1)
Eosinophils Absolute: 0.1 10*3/uL (ref 0.0–0.7)
Eosinophils Relative: 1 % (ref 0–5)
HCT: 35.6 % — ABNORMAL LOW (ref 36.0–46.0)
Hemoglobin: 11.4 g/dL — ABNORMAL LOW (ref 12.0–15.0)
Lymphs Abs: 2 10*3/uL (ref 0.7–4.0)
MCHC: 31.9 g/dL (ref 30.0–36.0)
MCV: 78.6 fL (ref 78.0–100.0)
Monocytes Absolute: 0.3 10*3/uL (ref 0.1–1.0)
Monocytes Relative: 6 % (ref 3–12)
RBC: 4.53 M/uL (ref 3.87–5.11)
WBC: 4.7 10*3/uL (ref 4.0–10.5)

## 2008-07-23 ENCOUNTER — Encounter (INDEPENDENT_AMBULATORY_CARE_PROVIDER_SITE_OTHER): Payer: Self-pay | Admitting: Internal Medicine

## 2008-09-19 ENCOUNTER — Encounter: Admission: RE | Admit: 2008-09-19 | Discharge: 2008-09-19 | Payer: Self-pay | Admitting: Obstetrics and Gynecology

## 2008-11-20 ENCOUNTER — Ambulatory Visit: Payer: Self-pay | Admitting: Obstetrics and Gynecology

## 2008-11-20 LAB — CONVERTED CEMR LAB

## 2008-11-22 ENCOUNTER — Telehealth: Payer: Self-pay | Admitting: Internal Medicine

## 2008-11-25 ENCOUNTER — Ambulatory Visit: Payer: Self-pay | Admitting: Internal Medicine

## 2008-11-25 LAB — CONVERTED CEMR LAB
Blood Glucose, Fingerstick: 214
Hgb A1c MFr Bld: 11.6 %

## 2008-12-16 ENCOUNTER — Ambulatory Visit: Payer: Self-pay | Admitting: Internal Medicine

## 2008-12-18 ENCOUNTER — Telehealth: Payer: Self-pay | Admitting: *Deleted

## 2009-02-05 ENCOUNTER — Telehealth: Payer: Self-pay | Admitting: Internal Medicine

## 2009-02-18 ENCOUNTER — Telehealth (INDEPENDENT_AMBULATORY_CARE_PROVIDER_SITE_OTHER): Payer: Self-pay | Admitting: *Deleted

## 2009-02-24 ENCOUNTER — Telehealth: Payer: Self-pay | Admitting: *Deleted

## 2009-03-22 ENCOUNTER — Inpatient Hospital Stay (HOSPITAL_COMMUNITY): Admission: AD | Admit: 2009-03-22 | Discharge: 2009-03-22 | Payer: Self-pay | Admitting: Obstetrics & Gynecology

## 2009-03-25 ENCOUNTER — Telehealth: Payer: Self-pay | Admitting: Internal Medicine

## 2009-04-01 ENCOUNTER — Ambulatory Visit: Payer: Self-pay | Admitting: Internal Medicine

## 2009-04-01 LAB — CONVERTED CEMR LAB
AST: 16 units/L (ref 0–37)
BUN: 11 mg/dL (ref 6–23)
Blood Glucose, Fingerstick: 251
Calcium: 8.9 mg/dL (ref 8.4–10.5)
Chloride: 98 meq/L (ref 96–112)
Creatinine, Ser: 0.62 mg/dL (ref 0.40–1.20)
HDL: 48 mg/dL (ref >39)
Hgb A1c MFr Bld: 10.6 %
Total Bilirubin: 0.4 mg/dL (ref 0.3–1.2)
Total CHOL/HDL Ratio: 3.3
VLDL: 32 mg/dL (ref 0–40)

## 2009-04-16 ENCOUNTER — Encounter: Payer: Self-pay | Admitting: Internal Medicine

## 2009-04-16 ENCOUNTER — Encounter: Admission: RE | Admit: 2009-04-16 | Discharge: 2009-06-17 | Payer: Self-pay | Admitting: Internal Medicine

## 2009-04-28 ENCOUNTER — Inpatient Hospital Stay (HOSPITAL_COMMUNITY): Admission: AD | Admit: 2009-04-28 | Discharge: 2009-04-28 | Payer: Self-pay | Admitting: Obstetrics & Gynecology

## 2009-05-12 ENCOUNTER — Telehealth: Payer: Self-pay | Admitting: Internal Medicine

## 2009-05-20 ENCOUNTER — Ambulatory Visit: Payer: Self-pay | Admitting: Infectious Diseases

## 2009-05-21 ENCOUNTER — Ambulatory Visit: Payer: Self-pay | Admitting: Obstetrics and Gynecology

## 2009-06-10 ENCOUNTER — Telehealth: Payer: Self-pay | Admitting: *Deleted

## 2009-06-16 ENCOUNTER — Telehealth: Payer: Self-pay | Admitting: Internal Medicine

## 2009-06-17 ENCOUNTER — Encounter: Payer: Self-pay | Admitting: Internal Medicine

## 2009-06-19 ENCOUNTER — Telehealth: Payer: Self-pay | Admitting: Internal Medicine

## 2009-07-01 ENCOUNTER — Encounter (INDEPENDENT_AMBULATORY_CARE_PROVIDER_SITE_OTHER): Payer: Self-pay | Admitting: Internal Medicine

## 2009-07-01 ENCOUNTER — Ambulatory Visit: Payer: Self-pay | Admitting: Internal Medicine

## 2009-07-01 ENCOUNTER — Observation Stay (HOSPITAL_COMMUNITY): Admission: EM | Admit: 2009-07-01 | Discharge: 2009-07-09 | Payer: Self-pay | Admitting: Emergency Medicine

## 2009-07-09 ENCOUNTER — Encounter: Payer: Self-pay | Admitting: Internal Medicine

## 2009-07-14 ENCOUNTER — Encounter: Payer: Self-pay | Admitting: Internal Medicine

## 2009-07-15 ENCOUNTER — Encounter: Payer: Self-pay | Admitting: Internal Medicine

## 2009-07-21 ENCOUNTER — Encounter: Payer: Self-pay | Admitting: Internal Medicine

## 2009-07-23 ENCOUNTER — Ambulatory Visit: Payer: Self-pay | Admitting: Internal Medicine

## 2009-07-23 DIAGNOSIS — Z6841 Body Mass Index (BMI) 40.0 and over, adult: Secondary | ICD-10-CM

## 2009-07-23 HISTORY — DX: Morbid (severe) obesity due to excess calories: E66.01

## 2009-07-23 LAB — CONVERTED CEMR LAB: Hgb A1c MFr Bld: 11.6 %

## 2009-07-28 ENCOUNTER — Telehealth: Payer: Self-pay | Admitting: Internal Medicine

## 2009-08-07 ENCOUNTER — Encounter: Payer: Self-pay | Admitting: Internal Medicine

## 2009-09-03 ENCOUNTER — Ambulatory Visit: Payer: Self-pay | Admitting: Internal Medicine

## 2009-09-03 ENCOUNTER — Encounter: Payer: Self-pay | Admitting: Internal Medicine

## 2009-09-19 ENCOUNTER — Telehealth: Payer: Self-pay | Admitting: *Deleted

## 2009-09-20 ENCOUNTER — Emergency Department (HOSPITAL_COMMUNITY): Admission: EM | Admit: 2009-09-20 | Discharge: 2009-09-20 | Payer: Self-pay | Admitting: Family Medicine

## 2009-09-20 ENCOUNTER — Emergency Department (HOSPITAL_COMMUNITY): Admission: EM | Admit: 2009-09-20 | Discharge: 2009-09-21 | Payer: Self-pay | Admitting: Emergency Medicine

## 2009-09-26 ENCOUNTER — Telehealth (INDEPENDENT_AMBULATORY_CARE_PROVIDER_SITE_OTHER): Payer: Self-pay | Admitting: *Deleted

## 2009-10-03 ENCOUNTER — Telehealth: Payer: Self-pay | Admitting: *Deleted

## 2009-10-07 ENCOUNTER — Telehealth: Payer: Self-pay | Admitting: Internal Medicine

## 2009-10-14 ENCOUNTER — Telehealth: Payer: Self-pay | Admitting: Licensed Clinical Social Worker

## 2009-10-24 ENCOUNTER — Encounter: Payer: Self-pay | Admitting: Internal Medicine

## 2009-10-29 ENCOUNTER — Telehealth: Payer: Self-pay | Admitting: Internal Medicine

## 2009-10-31 ENCOUNTER — Telehealth: Payer: Self-pay | Admitting: *Deleted

## 2009-10-31 ENCOUNTER — Emergency Department (HOSPITAL_COMMUNITY): Admission: EM | Admit: 2009-10-31 | Discharge: 2009-10-31 | Payer: Self-pay | Admitting: Emergency Medicine

## 2009-10-31 ENCOUNTER — Encounter: Payer: Self-pay | Admitting: Licensed Clinical Social Worker

## 2009-11-05 ENCOUNTER — Encounter: Payer: Self-pay | Admitting: Internal Medicine

## 2009-11-05 ENCOUNTER — Ambulatory Visit: Payer: Self-pay | Admitting: Internal Medicine

## 2009-11-05 DIAGNOSIS — R300 Dysuria: Secondary | ICD-10-CM

## 2009-11-05 LAB — CONVERTED CEMR LAB: Blood Glucose, Fingerstick: 266

## 2009-11-06 ENCOUNTER — Encounter: Payer: Self-pay | Admitting: Family Medicine

## 2009-11-10 LAB — CONVERTED CEMR LAB
Casts: NONE SEEN /lpf
Crystals: NONE SEEN
Ketones, ur: NEGATIVE mg/dL
Nitrite: NEGATIVE
Specific Gravity, Urine: 1.018 (ref 1.005–1.0)
Urine Glucose: 500 mg/dL — AB
pH: 6 (ref 5.0–8.0)

## 2009-11-11 ENCOUNTER — Ambulatory Visit: Payer: Self-pay | Admitting: Family Medicine

## 2009-11-19 ENCOUNTER — Ambulatory Visit: Payer: Self-pay | Admitting: Obstetrics and Gynecology

## 2010-01-06 ENCOUNTER — Ambulatory Visit: Payer: Self-pay | Admitting: Family Medicine

## 2010-01-07 ENCOUNTER — Telehealth: Payer: Self-pay | Admitting: Internal Medicine

## 2010-01-25 ENCOUNTER — Encounter: Payer: Self-pay | Admitting: Internal Medicine

## 2010-02-03 ENCOUNTER — Ambulatory Visit: Payer: Self-pay | Admitting: Family Medicine

## 2010-02-03 DIAGNOSIS — M1710 Unilateral primary osteoarthritis, unspecified knee: Secondary | ICD-10-CM

## 2010-02-10 ENCOUNTER — Telehealth: Payer: Self-pay | Admitting: Internal Medicine

## 2010-02-11 ENCOUNTER — Ambulatory Visit: Payer: Self-pay

## 2010-02-18 ENCOUNTER — Ambulatory Visit: Payer: Self-pay

## 2010-02-23 ENCOUNTER — Ambulatory Visit (HOSPITAL_COMMUNITY)
Admission: RE | Admit: 2010-02-23 | Discharge: 2010-02-23 | Payer: Self-pay | Source: Home / Self Care | Attending: Internal Medicine | Admitting: Internal Medicine

## 2010-02-23 ENCOUNTER — Ambulatory Visit: Admission: RE | Admit: 2010-02-23 | Discharge: 2010-02-23 | Payer: Self-pay | Source: Home / Self Care

## 2010-02-23 LAB — HM DIABETES FOOT EXAM

## 2010-02-23 LAB — CONVERTED CEMR LAB: Hgb A1c MFr Bld: 12.1 %

## 2010-02-24 ENCOUNTER — Ambulatory Visit: Admission: RE | Admit: 2010-02-24 | Discharge: 2010-02-24 | Payer: Self-pay | Source: Home / Self Care

## 2010-02-24 ENCOUNTER — Telehealth: Payer: Self-pay | Admitting: Internal Medicine

## 2010-02-24 LAB — CONVERTED CEMR LAB
Bilirubin Urine: NEGATIVE
Blood Glucose, Home Monitor: 2 mg/dL
Blood, UA: NEGATIVE
Casts: NONE SEEN /lpf
Ketones, ur: NEGATIVE mg/dL
Leukocytes, UA: NEGATIVE
Microalb, Ur: 1.63 mg/dL (ref 0.00–1.89)
Protein, ur: NEGATIVE mg/dL
Urine Glucose: >1000 mg/dL — AB
pH: 5.5 (ref 5.0–8.0)

## 2010-03-12 ENCOUNTER — Telehealth (INDEPENDENT_AMBULATORY_CARE_PROVIDER_SITE_OTHER): Payer: Self-pay | Admitting: *Deleted

## 2010-03-16 ENCOUNTER — Encounter: Payer: Self-pay | Admitting: Obstetrics and Gynecology

## 2010-03-17 ENCOUNTER — Emergency Department (HOSPITAL_COMMUNITY)
Admission: EM | Admit: 2010-03-17 | Discharge: 2010-03-17 | Payer: Self-pay | Source: Home / Self Care | Admitting: Emergency Medicine

## 2010-03-24 NOTE — Assessment & Plan Note (Signed)
Summary: NP,KNEE PAIN,MC   Vital Signs:  Patient profile:   35 year old female BP sitting:   130 / 83  Vitals Entered By: Lillia Pauls CMA (November 11, 2009 1:57 PM)  History of Present Illness: 35 yo F with morbid obestity, BMI = 60,  here for bilateral Knee pain.  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   Had a fall  5.10.11 where she was sleep walking, woke up in a split position, hurting her R hip and dropping to the ground on bilateral  knees.   Today, she is having bilateral knee pain. R knee is the worst pain today,  Left knee is painful but not as bad as R.   - R knee worsening ever since the injury   - taking Tramadol with no relief.  has tried ibuprofen in past as well   Underwent home PT after her hospital stay, 3 X a week.  Occupational therapy also came help use her cain, help her wash clothes etc.   Had Injection to her R knee approx 1.5 years ago with some relief.      Allergies: 1)  ! Pcn 2)  ! Doxycycline 3)  ! * Tomato  Past History:  Past medical, surgical, family and social histories (including risk factors) reviewed, and no changes noted (except as noted below).  Past Medical History: Reviewed history from 03/28/2008 and no changes required. Low back pain-Neg LS Xray 03/08/2003 GERD Hyperlipidemia Obstructive sleep apnea-CPAP at bedtime Carpal tunnel syndrome-B/L hand numbness; + nerve conduction study Type II diabetes mellitus-dx 12/2000, intolerant to metformin Genital herpes Shoulder pain-Right, neg xray 11/2002 Depression Asthma Medical Non-compliance Obesity  Family History: Reviewed history and no changes required.  Social History: Reviewed history from 08/01/2007 and no changes required. Single, but lives with a partner Has 2 children and cares for an infant during the day Current smoker  Review of Systems       REVIEW OF SYSTEMS  GEN: No systemic complaints, no fevers, chills, sweats, or other acute  illnesses MSK: Detailed in the HPI GI: tolerating PO intake without difficulty Neuro: No numbness, parasthesias, or tingling associated. Otherwise the pertinent positives of the ROS are noted above.    Physical Exam  General:  Well-developed,well-nourished,in no acute distress; alert,appropriate and cooperative throughout examination, morbidly obese Head:  Normocephalic and atraumatic without obvious abnormalities. No apparent alopecia or balding. Ears:  no external deformities.   Nose:  no external deformity.   Lungs:  normal respiratory effort.   Msk:  R knee  Mild effusion present.  ROM is limited due to pain.  Intact verus, valgus stress testing, ACL, PCL on ant / post drawer. firm end points.     Moderatly tender to palpation diffuesly. Lateral superior patellar very tender, medial joint line is most tender.  Mild popliteal fossa tenderness.  No masses palpated.   L knee - full rom in flexion and extenion.  no effusion  / echymosis.  crepitus is felt with flexion.  Non-tender to palpation of jointline, patella, tibial tuberosity, popliteal fossa.   Neurologic:  alert & oriented X3 and abnormal gait.   Cervical Nodes:  No lymphadenopathy noted Psych:  Cognition and judgment appear intact. Alert and cooperative with normal attention span and concentration. No apparent delusions, illusions, hallucinations   Impression & Recommendations:  Problem # 1:  KNEE PAIN (ZOX-096.04) Assessment New  R Knee pain > Left.  bilateral OA has been visualized on plain film.  Seemingly related to her fall back in May with acute worsening over the last few months with no new trauma.    Reviewed MRI and X-ray from may 2011, independently with no evidence of Fracture.  Notable tricompartmental OA B. Grade 1 MCL sprain on MRI. Effusion notable on L MRI. Minimal 2 signal in medial meniscus but not consistent with discrete tear.   R knee OA exacerbation, cannot rule out meniscal tear. Ultimate outcome  guarded due to continual stress on knee due to weight.  f/u 1 mo  Knee Injection Patient verbally consented to procedure. Risks, benefits, and alternatives explained. Sterilely prepped with betadine. Ethyl cholride used for anesthesia. 9 cc Lidocaine 1% mixed with 1 cc of Kenalog 40 mg injected using the anterolateral approach, then changed to anteromedial with good success. No complications with procedure and tolerated well. Patient had decreased pain post-injection.   Her updated medication list for this problem includes:    Skelaxin 800 Mg Tabs (Metaxalone) .Marland Kitchen... Take 1 tablet by mouth three times a day as needed    Tramadol Hcl 50 Mg Tabs (Tramadol hcl) .Marland Kitchen... Take 1-2 tablets every 8 hours as needed for pain    Ibuprofen 200 Mg Tabs (Ibuprofen) .Marland Kitchen... Takes 800mg  three times a day as needed.  Orders: Joint Aspirate / Injection, Large (20610) Kenalog 10mg  (4units) ([id number])  Problem # 2:  INTERNAL DERANGEMENT, RIGHT KNEE (ICD-717.9)  Orders: Joint Aspirate / Injection, Large (20610) Kenalog 10mg  (4units) ([id number])  Problem # 3:  MORBID OBESITY (ICD-278.01) unquestionably contributing. Advanced OA for age. discussed losing weight at all costs.  discussed joining aquatic center, Y, or any form of exercise in the pool.  Complete Medication List: 1)  Albuterol 90 Mcg/act Aers (Albuterol) .... Take 1-2 puffs every 4 hours as needed for shortness of breath, wheezing or cough 2)  Zovirax 200 Mg Caps (Acyclovir) .... Take 1 capsule by mouth once a day 3)  Pravachol 40 Mg Tabs (Pravastatin sodium) .... Take 1 tablet by mouth at bedtime 4)  Famotidine 40 Mg Tabs (Famotidine) .... Take 1 tablet by mouth once a day 5)  Skelaxin 800 Mg Tabs (Metaxalone) .... Take 1 tablet by mouth three times a day as needed 6)  Prodigy Lancets 28g Misc (Lancets) .... Use as directed to check sugar 7)  Advair Diskus 500-50 Mcg/dose Misc (Fluticasone-salmeterol) .Marland Kitchen.. 1 puff two times a day 8)  Lantus 100 Unit/ml  Soln (Insulin glargine) .... Inject 92 units into the skin of the abdomen at bedtime 9)  Bd Insulin Syringe Ultrafine 31g X 5/16" 1 Ml Misc (Insulin syringe-needle u-100) .... Use as directed for insulin injections 10)  Glipizide 5 Mg Tabs (Glipizide) .... Take 1 tablet by mouth two times a day 11)  Prodigy Blood Glucose Monitor Devi (Blood glucose monitoring suppl) .... Use to check blood sugar two times a day 12)  Benefiber Powd (Wheat dextrin) .... Add 1 scoop to beverage of choice per day 13)  Lactulose 10 Gm/29ml Soln (Lactulose) .... Take 15ml by mouth once or twice daily as needed for constipation 14)  Prodigy Blood Glucose Test Strp (Glucose blood) .... Use to check blood sugar two times a day 15)  Tramadol Hcl 50 Mg Tabs (Tramadol hcl) .... Take 1-2 tablets every 8 hours as needed for pain 16)  Flector 1.3 % Ptch (Diclofenac epolamine) .... Apply 1 patch twice a day in the affected joint. 17)  Ibuprofen 200 Mg Tabs (Ibuprofen) .... Takes 800mg  three times a day as needed.  18)  Fluconazole 150 Mg Tabs (Fluconazole) .... Take 1 tablet by mouth once a day 19)  Bactrim Ds 800-160 Mg Tabs (Sulfamethoxazole-trimethoprim) .... Take one (1) by mouth twice a day for 3 days

## 2010-03-24 NOTE — Assessment & Plan Note (Signed)
Summary: recheck DM/gg   Vital Signs:  Patient profile:   35 year old female Height:      62 inches (157.48 cm) Weight:      315.8 pounds (143.55 kg) BMI:     57.97 Temp:     97.1 degrees F (36.17 degrees C) oral Pulse rate:   75 / minute BP sitting:   140 / 93  (right arm)  Vitals Entered By: Stanton Kidney Ditzler RN (April 01, 2009 3:14 PM) Is Patient Diabetic? Yes Did you bring your meter with you today? Yes Pain Assessment Patient in pain? yes     Location: back Intensity: 7 Type: throbbing Onset of pain  past 2 days Nutritional Status BMI of > 30 = obese Nutritional Status Detail appetite fair CBG Result 251  Have you ever been in a relationship where you felt threatened, hurt or afraid?denies   Does patient need assistance? Functional Status Self care Ambulation Normal Comments FU - back pain past 2 days. Ankles and knees hurting - right side worse.   History of Present Illness: 35 y/o female with pmh as described on the EMR, who comes to the clinic for followup of her chronic conditions and also due to pain in her knees, ankles and back. Patient back pain started 2 days ago after falling sleep in the couch and her ankles and knees have been chronically tender for the last couple of months, she has OA.  Patient was constantly falling sleep during interview, patient has not use her CPAP every night as directed because if she does it then nobody will woke her kids on time for school.  Patient right ankle is mildly swollen, no warm to touch, tender to palpation and w/o redness. BP is essentially at [facility] (today mildly elevated, most likely due to pain) and her CBG was 251 at the office.   Patient glucometer demonstrated CBG's in the 260-300 range most of the times, and her A1C was 10.6 today.     Depression History:      The patient denies a depressed mood most of the day and a diminished interest in her usual daily activities.        The patient denies that she feels  like life is not worth living, denies that she wishes that she were dead, and denies that she has thought about ending her life.         Preventive Screening-Counseling & Management  Alcohol-Tobacco     Smoking Status: quit     Packs/Day: 2 cigs/month     Year Started: AT TIMES     Year Quit: 5 months     Passive Smoke Exposure: no  Caffeine-Diet-Exercise     Does Patient Exercise: yes     Type of exercise: STAIRS / WALK UP A HILL     Times/week: 3  Problems Prior to Update: 1)  Constipation  (ICD-564.00) 2)  Candidiasis, Vaginal  (ICD-112.1) 3)  Cough  (ICD-786.2) 4)  Pharyngitis, Streptococcal  (ICD-034.0) 5)  Tonsillitis, Recurrent  (ICD-463) 6)  Acute Pharyngitis  (ICD-462) 7)  Anemia  (ICD-285.9) 8)  Pruritus  (ICD-698.9) 9)  Numbness, Hand  (ICD-782.0) 10)  Hypersomnia, Associated With Sleep Apnea  (ICD-780.53) 11)  Dry Skin  (ICD-757.39) 12)  ? of Asthma  (ICD-493.90) 13)  Leg Edema, Bilateral  (ICD-782.3) 14)  Depression  (ICD-311) 15)  Genital Herpes  (ICD-054.10) 16)  Diabetes Mellitus, Type II  (ICD-250.00) 17)  Carpal Tunnel Syndrome  (ICD-354.0) 18)  Sleep Apnea  (ICD-780.57) 19)  Hyperlipidemia  (ICD-272.4) 20)  Gerd  (ICD-530.81) 21)  Low Back Pain, Chronic  (ICD-724.2)  Medications Prior to Update: 1)  Albuterol 90 Mcg/act  Aers (Albuterol) .... Take 1-2 Puffs Every 4 Hours As Needed For Shortness of Breath, Wheezing or Cough 2)  Zovirax 200 Mg Caps (Acyclovir) .... Take 1 Capsule By Mouth Once A Day 3)  Pravachol 40 Mg  Tabs (Pravastatin Sodium) .... Take 1 Tablet By Mouth At Bedtime 4)  Fluoxetine Hcl 20 Mg Tabs (Fluoxetine Hcl) .... Take 3 Tablets By Mouth Once A Day 5)  Famotidine 40 Mg Tabs (Famotidine) .... Take 1 Tablet By Mouth Once A Day 6)  Skelaxin 800 Mg  Tabs (Metaxalone) .... Take 1 Tablet By Mouth Three Times A Day As Needed 7)  Bl Lancets Super Thin   Misc (Lancets) .... Use As Directed. 8)  Ultram Er 100 Mg  Tb24 (Tramadol Hcl) ....  Take 1 Tablet By Mouth Once A Day As Needed For Pain 9)  Advair Diskus 500-50 Mcg/dose Misc (Fluticasone-Salmeterol) .Marland Kitchen.. 1 Puff Two Times A Day 10)  Lantus 100 Unit/ml  Soln (Insulin Glargine) .... Inject 50 Units Into The Skin of The Abdomen At Bedtime 11)  Bd Insulin Syringe Ultrafine 31g X 5/16" 1 Ml  Misc (Insulin Syringe-Needle U-100) .... Use As Directed For Insulin Injections 12)  Glipizide 5 Mg  Tabs (Glipizide) .... Take 1 Tablet By Mouth Two Times A Day 13)  Prodigy Blood Glucose Monitor  Devi (Blood Glucose Monitoring Suppl) .... Use To Check Blood Sugar Two Times A Day 14)  Benefiber  Powd (Wheat Dextrin) .... Add 1 Scoop To Beverage of Choice Per Day 15)  Fluconazole 150 Mg Tabs (Fluconazole) .... Take 1 Tablet By Mouth Once A Day 16)  Lactulose 10 Gm/8ml Soln (Lactulose) .... Take 15ml By Mouth Once or Twice Daily As Needed For Constipation 17)  Prodigy Blood Glucose Test  Strp (Glucose Blood) .... Use To Check Blood Sugar Two Times A Day  Current Medications (verified): 1)  Albuterol 90 Mcg/act  Aers (Albuterol) .... Take 1-2 Puffs Every 4 Hours As Needed For Shortness of Breath, Wheezing or Cough 2)  Zovirax 200 Mg Caps (Acyclovir) .... Take 1 Capsule By Mouth Once A Day 3)  Pravachol 40 Mg  Tabs (Pravastatin Sodium) .... Take 1 Tablet By Mouth At Bedtime 4)  Fluoxetine Hcl 20 Mg Tabs (Fluoxetine Hcl) .... Take 3 Tablets By Mouth Once A Day 5)  Famotidine 40 Mg Tabs (Famotidine) .... Take 1 Tablet By Mouth Once A Day 6)  Skelaxin 800 Mg  Tabs (Metaxalone) .... Take 1 Tablet By Mouth Three Times A Day As Needed 7)  Bl Lancets Super Thin   Misc (Lancets) .... Use As Directed. 8)  Ultram Er 100 Mg  Tb24 (Tramadol Hcl) .... Take 1 Tablet By Mouth Once A Day As Needed For Pain 9)  Advair Diskus 500-50 Mcg/dose Misc (Fluticasone-Salmeterol) .Marland Kitchen.. 1 Puff Two Times A Day 10)  Lantus 100 Unit/ml  Soln (Insulin Glargine) .... Inject 50 Units Into The Skin of The Abdomen At Bedtime 11)   Bd Insulin Syringe Ultrafine 31g X 5/16" 1 Ml  Misc (Insulin Syringe-Needle U-100) .... Use As Directed For Insulin Injections 12)  Glipizide 5 Mg  Tabs (Glipizide) .... Take 1 Tablet By Mouth Two Times A Day 13)  Prodigy Blood Glucose Monitor  Devi (Blood Glucose Monitoring Suppl) .... Use To Check Blood Sugar Two Times A Day  14)  Benefiber  Powd (Wheat Dextrin) .... Add 1 Scoop To Beverage of Choice Per Day 15)  Fluconazole 150 Mg Tabs (Fluconazole) .... Take 1 Tablet By Mouth Once A Day 16)  Lactulose 10 Gm/31ml Soln (Lactulose) .... Take 15ml By Mouth Once or Twice Daily As Needed For Constipation 17)  Prodigy Blood Glucose Test  Strp (Glucose Blood) .... Use To Check Blood Sugar Two Times A Day  Allergies: 1)  ! Pcn 2)  ! Doxycycline 3)  ! * Tomato  Past History:  Past Medical History: Last updated: 03/28/2008 Low back pain-Neg LS Xray 03/08/2003 GERD Hyperlipidemia Obstructive sleep apnea-CPAP at bedtime Carpal tunnel syndrome-B/L hand numbness; + nerve conduction study Type II diabetes mellitus-dx 12/2000, intolerant to metformin Genital herpes Shoulder pain-Right, neg xray 11/2002 Depression Asthma Medical Non-compliance Obesity  Social History: Last updated: 08/01/2007 Single, but lives with a partner Has 2 children and cares for an infant during the day Current smoker  Risk Factors: Exercise: yes (04/01/2009)  Risk Factors: Smoking Status: quit (04/01/2009) Packs/Day: 2 cigs/month (04/01/2009) Passive Smoke Exposure: no (04/01/2009)  Review of Systems  The patient denies anorexia, fever, chest pain, syncope, prolonged cough, headaches, abdominal pain, melena, and hematochezia.    Physical Exam  General:  obese, NAD, well-developed, well-nourished, and well-hydrated.   Lungs:  normal respiratory effort, no accessory muscle use, normal breath sounds, no crackles, and no wheezes.   Heart:  normal rate, regular rhythm, no gallop, no rub, and no JVD.  grade 1/6  systolic murmur Abdomen:  obese, soft, non-tender, and normal bowel sounds.  no distention and no rebound tenderness.   Msk:  right ankle joint was swollen, no redness over joint, no crepitation, and just mild decreased in range of motion.   Neurologic:  falling asleep during exam, easily arousable. oriented X3.   cranial nerves II-XII intact, strength normal in all extremities, sensation intact to light touch, and gait normal.     Impression & Recommendations:  Problem # 1:  HYPERSOMNIA, ASSOCIATED WITH SLEEP APNEA (ICD-780.53) Patient with severe sleep apnea and associated hypersomnia. Per patient she has been unable to use her CPAP every night as prescribed because her children would be unable to go to school, since she would not hear the alrm and would not woke up. She is wearing the CPAP only during weekends. Patient advised of importance with CPAP compliance and discussion regarding alternative, like going to bed earlier, using the machine in the afternoon, or even asking for partner to help with situation occurred during this visit. Patient is planing to wear her CPAP as much as possible. Patient was advised to lose weight, which can also help with the problem.  Problem # 2:  DIABETES MELLITUS, TYPE II (ICD-250.00) Patient A1C has improved from 10.3 to 8.8; Will continue current regimen and will advised her to follow a low carb diet (less than 65grams daily). Patient encourage to exerciseand, tolose weight and to follow a low calorie diet.  Her updated medication list for this problem includes:    Lantus 100 Unit/ml Soln (Insulin glargine) ..... Inject 50 units into the skin of the abdomen at bedtime    Glipizide 5 Mg Tabs (Glipizide) .Marland Kitchen... Take 1 tablet by mouth two times a day  Orders: T- Capillary Blood Glucose (82948) T-Hgb A1C (in-house) ([id number])  Problem # 3:  HYPERLIPIDEMIA (ICD-272.4) Lipid profile was checked today and her LDL is at goal (69) and she has normal LFT's. Will  continue current regimen of pravachol  40mg  daily and will advised her tofollow a low fat diet.  Her updated medication list for this problem includes:    Pravachol 40 Mg Tabs (Pravastatin sodium) .Marland Kitchen... Take 1 tablet by mouth at bedtime  Orders: T-Lipid Profile [phone])  Labs Reviewed: SGOT: 11 (02/19/2008)   SGPT: 13 (02/19/2008)   HDL:52 (03/28/2008), 51 (05/18/2007)  LDL:69 (03/28/2008), 100 (20/25/4270)  Chol:142 (03/28/2008), 178 (05/18/2007)  Trig:103 (03/28/2008), 137 (05/18/2007)  Problem # 4:  LOW BACK PAIN, CHRONIC (ICD-724.2) Patient with hx of chronic back pain secondary to OA. Will refer to physical therapy and will restart tramadol for pain control. Will also give prescription for flector pacth and will provide instructions for strecthing exercises.  The following medications were removed from the medication list:    Ultram Er 100 Mg Tb24 (Tramadol hcl) .Marland Kitchen... Take 1 tablet by mouth once a day as needed for pain Her updated medication list for this problem includes:    Skelaxin 800 Mg Tabs (Metaxalone) .Marland Kitchen... Take 1 tablet by mouth three times a day as needed    Tramadol Hcl 50 Mg Tabs (Tramadol hcl) .Marland Kitchen... Take 1 tablet by mouth every 8 hours as needed for pain.  Orders: Physical Therapy Referral (PT)  Problem # 5:  GERD (ICD-530.81) Well controlledwith the use of famotidine.Willencourage patient to continue good medication compliance and will refresh lifestyle modification changes for even better control.  Her updated medication list for this problem includes:    Famotidine 40 Mg Tabs (Famotidine) .Marland Kitchen... Take 1 tablet by mouth once a day  Problem # 6:  LEG EDEMA, BILATERAL (ICD-782.3) Patient with chronic edema of LE bilaterally, presenting today with just swelling of her ankle; there was no erythema, no warmth of the joint and just mild decreased range of motion and pain. Will recommend to keep legs elevated, to use tramadol and flect patch for pain control if needed.  This swelling is most likely due to venous inssuficiency.  Complete Medication List: 1)  Albuterol 90 Mcg/act Aers (Albuterol) .... Take 1-2 puffs every 4 hours as needed for shortness of breath, wheezing or cough 2)  Zovirax 200 Mg Caps (Acyclovir) .... Take 1 capsule by mouth once a day 3)  Pravachol 40 Mg Tabs (Pravastatin sodium) .... Take 1 tablet by mouth at bedtime 4)  Fluoxetine Hcl 20 Mg Tabs (Fluoxetine hcl) .... Take 3 tablets by mouth once a day 5)  Famotidine 40 Mg Tabs (Famotidine) .... Take 1 tablet by mouth once a day 6)  Skelaxin 800 Mg Tabs (Metaxalone) .... Take 1 tablet by mouth three times a day as needed 7)  Bl Lancets Super Thin Misc (Lancets) .... Use as directed. 8)  Advair Diskus 500-50 Mcg/dose Misc (Fluticasone-salmeterol) .Marland Kitchen.. 1 puff two times a day 9)  Lantus 100 Unit/ml Soln (Insulin glargine) .... Inject 50 units into the skin of the abdomen at bedtime 10)  Bd Insulin Syringe Ultrafine 31g X 5/16" 1 Ml Misc (Insulin syringe-needle u-100) .... Use as directed for insulin injections 11)  Glipizide 5 Mg Tabs (Glipizide) .... Take 1 tablet by mouth two times a day 12)  Prodigy Blood Glucose Monitor Devi (Blood glucose monitoring suppl) .... Use to check blood sugar two times a day 13)  Benefiber Powd (Wheat dextrin) .... Add 1 scoop to beverage of choice per day 14)  Fluconazole 150 Mg Tabs (Fluconazole) .... Take 1 tablet by mouth once a day 15)  Lactulose 10 Gm/36ml Soln (Lactulose) .... Take 15ml by mouth once or twice  daily as needed for constipation 16)  Prodigy Blood Glucose Test Strp (Glucose blood) .... Use to check blood sugar two times a day 17)  Tramadol Hcl 50 Mg Tabs (Tramadol hcl) .... Take 1 tablet by mouth every 8 hours as needed for pain. 18)  Flector 1.3 % Ptch (Diclofenac epolamine) .... Apply 1 patch twice a day in the affected joint.  Other Orders: T-Comprehensive Metabolic Panel ([id number])  Patient Instructions: 1)  Please schedule a  follow-up appointment in 1 month follow up. 2)  Make sure to use your insulin as prescribed and follow a low carb diet. 3)  Most patients (90%) with low back pain will improve with time (2-6 weeks). Keep active but avoid activities that are painful. Apply moist heat and/or ice to lower back several times a day. 4)  You will be called with any abnormalities in the tests scheduled or performed today.  If you don't hear from [place] within a week from when the test was performed, you can assume that your test was normal. Prescriptions: FLECTOR 1.3 % PTCH (DICLOFENAC EPOLAMINE) Apply 1 patch twice a day in the affected joint.  #60 x 2   Entered and Authorized by:   Vassie Loll MD   Signed by:   Vassie Loll MD on 04/01/2009   Method used:   Electronically to        [facility] (retail)       [address]       [place], [place]  [address]       Ph: [phone]       Fax: [phone]   RxID:   [id number] TRAMADOL HCL 50 MG TABS (TRAMADOL HCL) Take 1 tablet by mouth every 8 hours as needed for pain.  #45 x 1   Entered and Authorized by:   Vassie Loll MD   Signed by:   Vassie Loll MD on 04/01/2009   Method used:   Electronically to        [facility] (retail)       [address]       [place], [place]  [address]       Ph: [phone]       Fax: [phone]   RxID:   [id number]  Process Orders Check Orders Results:     [facility]: ABN not required for this insurance Tests Sent for requisitioning (April 11, 2009 9:09 AM):     04/01/2009: [facility] -- T-Lipid Profile [phone] (signed)     04/01/2009: [facility] -- T-Comprehensive Metabolic Panel [id number] (signed)    Prevention & Chronic Care Immunizations   Influenza vaccine: Fluvax Non-MCR  (12/18/2007)   Influenza vaccine deferral: Deferred  (04/01/2009)    Tetanus booster: Not  documented   Td booster deferral: Deferred  (04/01/2009)    Pneumococcal vaccine: Pneumovax  (04/17/2008)   Pneumococcal vaccine deferral: Deferred  (04/01/2009)  Other Screening   Pap smear: NEGATIVE FOR INTRAEPITHELIAL LESIONS OR MALIGNANCY.  (04/11/2008)   Smoking status: quit  (04/01/2009)  Diabetes Mellitus   HgbA1C: 10.6  (04/01/2009)    Eye exam: Not documented   Diabetic eye exam action/deferral: Ophthalmology referral  (11/25/2008)    Foot exam: yes  (11/25/2008)   Foot exam action/deferral: Do today   High risk foot: Not documented   Foot care education: Not documented    Urine microalbumin/creatinine ratio: 7.9  (  08/02/2007)    Diabetes flowsheet reviewed?: Yes   Progress toward A1C goal: Improved  Lipids   Total Cholesterol: 142  (03/28/2008)   Lipid panel action/deferral: Lipid Panel ordered   LDL: 69  (03/28/2008)   LDL Direct: Not documented   HDL: 52  (03/28/2008)   Triglycerides: 103  (03/28/2008)    SGOT (AST): 11  (02/19/2008)   BMP action: Ordered   SGPT (ALT): 13  (02/19/2008) CMP ordered    Alkaline phosphatase: 42  (02/19/2008)   Total bilirubin: 0.3  (02/19/2008)    Lipid flowsheet reviewed?: Yes   Progress toward LDL goal: At goal  Self-Management Support :   Personal Goals (by the next clinic visit) :     Personal A1C goal: 8  (04/01/2009)     Personal blood pressure goal: 130/80  (04/01/2009)     Personal LDL goal: 70  (04/01/2009)    Patient will work on the following items until the next clinic visit to reach self-care goals:     Medications and monitoring: check my blood sugar, weigh myself weekly, examine my feet every day  (04/01/2009)     Eating: drink diet soda or water instead of juice or soda, eat more vegetables, use fresh or frozen vegetables, eat foods that are low in salt, eat baked foods instead of fried foods, eat fruit for snacks and desserts  (04/01/2009)     Activity: take a 30 minute walk every day  (11/25/2008)     Diabetes self-management support: Written self-care plan  (04/01/2009)   Diabetes care plan printed    Lipid self-management support: Written self-care plan  (04/01/2009)   Lipid self-care plan printed.  Laboratory Results   Blood Tests   Date/Time Received: April 01, 2009 3:34 PM  Date/Time Reported: Burke Keels  April 01, 2009 3:34 PM   HGBA1C: 10.6%   (Normal Range: Non-Diabetic - 3-6%   Control Diabetic - 6-8%) CBG Random:: 251mg /dL

## 2010-03-24 NOTE — Progress Notes (Signed)
Summary: message/ hla  Phone Note Call from Patient   Summary of Call: pt called and left message about falling, attempted to call and speak w/ pt but a gentleman who answered said she was resting, i ask him to have her call at her convenience, he stated he would do so, left him the ph# [phone], follow prompts to reach triage Initial call taken by: Marin Roberts RN,  October 03, 2009 2:21 PM

## 2010-03-24 NOTE — Progress Notes (Signed)
Summary: refill/gg  Phone Note Refill Request  on October 29, 2009 11:53 AM  Refills Requested: Medication #1:  BD INSULIN SYRINGE ULTRAFINE 31G X 5/16" 1 ML  MISC Use as directed for insulin injections   Last Refilled: 10/24/2009  Method Requested: Electronic Initial call taken by: Merrie Roof RN,  October 29, 2009 11:54 AM  Follow-up for Phone Call       Follow-up by: Blanch Media MD,  October 29, 2009 11:58 AM    Prescriptions: BD INSULIN SYRINGE ULTRAFINE 31G X 5/16" 1 ML  MISC (INSULIN SYRINGE-NEEDLE U-100) Use as directed for insulin injections  #100 x 11   Entered and Authorized by:   Blanch Media MD   Signed by:   Blanch Media MD on 10/29/2009   Method used:   Electronically to        [facility] (retail)       [address]       [place], [place]  [address]       Ph: [phone]       Fax: [phone]   RxID:   [id number]

## 2010-03-24 NOTE — Miscellaneous (Signed)
[facility]   Imported By: Margie Billet 07/16/2009 15:26:50  _____________________________________________________________________  External Attachment:    Type:   Image     Comment:   External Document

## 2010-03-24 NOTE — Progress Notes (Signed)
Summary: refill/ hla  Phone Note Refill Request Message from:  Patient on October 07, 2009 3:03 PM  Refills Requested: Medication #1:  diflucan 150mg  she states you said she could have this whenever she needs it  Initial call taken by: Marin Roberts RN,  October 07, 2009 3:03 PM  Follow-up for Phone Call        I have sent this in earlier today. Follow-up by: Mariea Stable MD,  October 09, 2009 2:33 PM

## 2010-03-24 NOTE — Assessment & Plan Note (Signed)
Summary: F/U/EST/VS   Vital Signs:  Patient profile:   35 year old female Height:      62 inches (157.48 cm) Weight:      313.9 pounds (142.68 kg) BMI:     57.62 Temp:     98.1 degrees F (36.72 degrees C) oral Pulse rate:   95 / minute BP sitting:   144 / 80  (right arm)  Vitals Entered By: Cynda Familia Duncan Dull) (May 20, 2009 4:41 PM) CC: f/u Is Patient Diabetic? Yes Did you bring your meter with you today? No Pain Assessment Patient in pain? yes     Location: back Intensity: 10 Type: sharp Onset of pain  constant x3wks Nutritional Status BMI of > 30 = obese  Have you ever been in a relationship where you felt threatened, hurt or afraid?No   Does patient need assistance? Functional Status Self care Ambulation Normal   CC:  f/u.  History of Present Illness: Ana Long is a 35 yo woman with pMH as outlined below.  She is here because of lower back pain. This is the same pain as before but worse in intensity.  She was referred to PT and she stopped going b/c of pain.  She reports using ibuprofen 800mg  three times a day, tramadol 50mg  two times a day, and flector patch without relief.    Preventive Screening-Counseling & Management  Alcohol-Tobacco     Smoking Status: quit     Packs/Day: 2 cigs/month     Year Started: AT TIMES     Year Quit: 5 months     Passive Smoke Exposure: no  Allergies: 1)  ! Pcn 2)  ! Doxycycline 3)  ! * Tomato  Past History:  Past Medical History: Last updated: 03/28/2008 Low back pain-Neg LS Xray 03/08/2003 GERD Hyperlipidemia Obstructive sleep apnea-CPAP at bedtime Carpal tunnel syndrome-B/L hand numbness; + nerve conduction study Type II diabetes mellitus-dx 12/2000, intolerant to metformin Genital herpes Shoulder pain-Right, neg xray 11/2002 Depression Asthma Medical Non-compliance Obesity  Review of Systems      See HPI  Physical Exam  General:  obese, appears to be in mod distress 2/2  pain. Eyes:  anicteric Lungs:  normal respiratory effort and no accessory muscle use.   Msk:  tenderness over lumbar spine and paraspinals.    Extremities:  no edema Neurologic:  alert & oriented X3, cranial nerves II-XII intact, strength normal in all extremities, sensation intact to pinprick, and gait normal.   Psych:  Oriented X3.     Impression & Recommendations:  Problem # 1:  LOW BACK PAIN, CHRONIC (ICD-724.2) Pt with chronic back pain but reports recent worsening. No red flags. Recent MRI L spine without etiology for pain Reports prior injections with great relief by Dr. Farris Has, will refer back Increase tramadol until seen  Her updated medication list for this problem includes:    Skelaxin 800 Mg Tabs (Metaxalone) .Marland Kitchen... Take 1 tablet by mouth three times a day as needed    Tramadol Hcl 50 Mg Tabs (Tramadol hcl) .Marland Kitchen... Take 1-2 tablets every 8 hours as needed for pain  Orders: Sports Medicine (Sports Med)  Complete Medication List: 1)  Albuterol 90 Mcg/act Aers (Albuterol) .... Take 1-2 puffs every 4 hours as needed for shortness of breath, wheezing or cough 2)  Zovirax 200 Mg Caps (Acyclovir) .... Take 1 capsule by mouth once a day 3)  Pravachol 40 Mg Tabs (Pravastatin sodium) .... Take 1 tablet by mouth at bedtime  4)  Fluoxetine Hcl 20 Mg Tabs (Fluoxetine hcl) .... Take 3 tablets by mouth once a day 5)  Famotidine 40 Mg Tabs (Famotidine) .... Take 1 tablet by mouth once a day 6)  Skelaxin 800 Mg Tabs (Metaxalone) .... Take 1 tablet by mouth three times a day as needed 7)  Bl Lancets Super Thin Misc (Lancets) .... Use as directed. 8)  Advair Diskus 500-50 Mcg/dose Misc (Fluticasone-salmeterol) .Marland Kitchen.. 1 puff two times a day 9)  Lantus 100 Unit/ml Soln (Insulin glargine) .... Inject 50 units into the skin of the abdomen at bedtime 10)  Bd Insulin Syringe Ultrafine 31g X 5/16" 1 Ml Misc (Insulin syringe-needle u-100) .... Use as directed for insulin injections 11)  Glipizide 5 Mg  Tabs (Glipizide) .... Take 1 tablet by mouth two times a day 12)  Prodigy Blood Glucose Monitor Devi (Blood glucose monitoring suppl) .... Use to check blood sugar two times a day 13)  Benefiber Powd (Wheat dextrin) .... Add 1 scoop to beverage of choice per day 14)  Fluconazole 150 Mg Tabs (Fluconazole) .... Take 1 tablet by mouth once a day 15)  Lactulose 10 Gm/55ml Soln (Lactulose) .... Take 15ml by mouth once or twice daily as needed for constipation 16)  Prodigy Blood Glucose Test Strp (Glucose blood) .... Use to check blood sugar two times a day 17)  Tramadol Hcl 50 Mg Tabs (Tramadol hcl) .... Take 1-2 tablets every 8 hours as needed for pain 18)  Flector 1.3 % Ptch (Diclofenac epolamine) .... Apply 1 patch twice a day in the affected joint.  Patient Instructions: 1)  Please schedule a follow-up appointment in 1 month. 2)  put in referral for sports medicine.  3)  can increase tramadol to 2 tablets every 8 hours until seen by Dr. Farris Has (only short term). 4)  if you have any problems before you rnext appointmnet, call clinic.    Prevention & Chronic Care Immunizations   Influenza vaccine: Fluvax Non-MCR  (12/18/2007)   Influenza vaccine deferral: Deferred  (04/01/2009)    Tetanus booster: Not documented   Td booster deferral: Deferred  (04/01/2009)    Pneumococcal vaccine: Pneumovax  (04/17/2008)   Pneumococcal vaccine deferral: Deferred  (04/01/2009)  Other Screening   Pap smear: NEGATIVE FOR INTRAEPITHELIAL LESIONS OR MALIGNANCY.  (04/11/2008)   Smoking status: quit  (05/20/2009)  Diabetes Mellitus   HgbA1C: 10.6  (04/01/2009)    Eye exam: Not documented   Diabetic eye exam action/deferral: Ophthalmology referral  (11/25/2008)    Foot exam: yes  (11/25/2008)   Foot exam action/deferral: Do today   High risk foot: Not documented   Foot care education: Not documented    Urine microalbumin/creatinine ratio: 7.9  (08/02/2007)  Lipids   Total Cholesterol: 157   (04/01/2009)   Lipid panel action/deferral: Lipid Panel ordered   LDL: 77  (04/01/2009)   LDL Direct: Not documented   HDL: 48  (04/01/2009)   Triglycerides: 161  (04/01/2009)    SGOT (AST): 16  (04/01/2009)   BMP action: Ordered   SGPT (ALT): 23  (04/01/2009)   Alkaline phosphatase: 40  (04/01/2009)   Total bilirubin: 0.4  (04/01/2009)  Self-Management Support :   Personal Goals (by the next clinic visit) :     Personal A1C goal: 8  (04/01/2009)     Personal blood pressure goal: 130/80  (04/01/2009)     Personal LDL goal: 70  (04/01/2009)    Patient will work on the following items until the next  clinic visit to reach self-care goals:     Medications and monitoring: take my medicines every day  (05/20/2009)     Eating: drink diet soda or water instead of juice or soda, eat foods that are low in salt, eat baked foods instead of fried foods  (05/20/2009)     Activity: take a 30 minute walk every day  (11/25/2008)    Diabetes self-management support: Pre-printed educational material, Resources for patients handout, Written self-care plan  (05/20/2009)   Diabetes care plan printed    Lipid self-management support: Pre-printed educational material, Resources for patients handout, Written self-care plan, Education handout  (05/20/2009)   Lipid self-care plan printed.   Lipid education handout printed      Resource handout printed.

## 2010-03-24 NOTE — Progress Notes (Signed)
Summary: prior authorization/gp  Phone Note Outgoing Call   Summary of Call: Prior Authorization for Advair Diskus 500/50  has been approved from 02/24/2009 to 02/24/2011. [facility] made awared. Initial call taken by: Chinita Pester RN,  February 24, 2009 4:28 PM

## 2010-03-24 NOTE — Miscellaneous (Signed)
[facility]   Imported By: Gentry Fitz 08/19/2009 14:42:16  _____________________________________________________________________  External Attachment:    Type:   Image     Comment:   External Document

## 2010-03-24 NOTE — Progress Notes (Signed)
Summary: med refill/gp  Phone Note Refill Request Message from:  Fax from Pharmacy on July 28, 2009 10:16 AM  Refills Requested: Medication #1:  BL LANCETS SUPER THIN   MISC Use as directed. Pt. uses Prodigy Lancets.   Method Requested: Electronic Initial call taken by: Chinita Pester RN,  July 28, 2009 10:16 AM  Follow-up for Phone Call        Rx faxed to pharmacy Follow-up by: Mariea Stable MD,  July 28, 2009 10:22 AM    New/Updated Medications: PRODIGY LANCETS 28G  MISC (LANCETS) use as directed to check sugar Prescriptions: PRODIGY LANCETS 28G  MISC (LANCETS) use as directed to check sugar  #100 x prn   Entered and Authorized by:   Mariea Stable MD   Signed by:   Mariea Stable MD on 07/28/2009   Method used:   Electronically to        [facility] (retail)       [address]       [place], [place]  [address]       Ph: [phone]       Fax: [phone]   RxID:   [id number]

## 2010-03-24 NOTE — Progress Notes (Signed)
Summary: check on pt/ hla  Phone Note Call from Patient   Summary of Call: called pt to check on her and see if she had gone to ED or urg care, no one answered and got no voicemail,  Initial call taken by: Marin Roberts RN,  November 12, 2009 5:49 PM

## 2010-03-24 NOTE — Discharge Summary (Signed)
Summary: Hospital Discharge Update    Hospital Discharge Update:   Brief Summary:  s/p fall, no fractures, sent home with PT, and HHRN DM uncontrolled A1c 13. we increased lantus from 50 to 70 units.  Other follow-up issues:  assess for ability to ambulate. adjust dm regiment if cbg is high.  Medication list changes:  Changed medication from LANTUS 100 UNIT/ML  SOLN (INSULIN GLARGINE) Inject 50 units into the skin of the abdomen at bedtime to LANTUS 100 UNIT/ML  SOLN (INSULIN GLARGINE) Inject 70 units into the skin of the abdomen at bedtime  Discharge medications:  ALBUTEROL 90 MCG/ACT  AERS (ALBUTEROL) Take 1-2 puffs every 4 hours as needed for shortness of breath, wheezing or cough ZOVIRAX 200 MG CAPS (ACYCLOVIR) Take 1 capsule by mouth once a day PRAVACHOL 40 MG  TABS (PRAVASTATIN SODIUM) Take 1 tablet by mouth at bedtime FLUOXETINE HCL 20 MG TABS (FLUOXETINE HCL) Take 3 tablets by mouth once a day FAMOTIDINE 40 MG TABS (FAMOTIDINE) Take 1 tablet by mouth once a day SKELAXIN 800 MG  TABS (METAXALONE) Take 1 tablet by mouth three times a day as needed BL LANCETS SUPER THIN   MISC (LANCETS) Use as directed. ADVAIR DISKUS 500-50 MCG/DOSE MISC (FLUTICASONE-SALMETEROL) 1 puff two times a day LANTUS 100 UNIT/ML  SOLN (INSULIN GLARGINE) Inject 70 units into the skin of the abdomen at bedtime BD INSULIN SYRINGE ULTRAFINE 31G X 5/16" 1 ML  MISC (INSULIN SYRINGE-NEEDLE U-100) Use as directed for insulin injections GLIPIZIDE 5 MG  TABS (GLIPIZIDE) Take 1 tablet by mouth two times a day PRODIGY BLOOD GLUCOSE MONITOR  DEVI (BLOOD GLUCOSE MONITORING SUPPL) Use to check blood sugar two times a day BENEFIBER  POWD (WHEAT DEXTRIN) Add 1 scoop to beverage of choice per day FLUCONAZOLE 150 MG TABS (FLUCONAZOLE) Take 1 tablet by mouth once a day LACTULOSE 10 GM/15ML SOLN (LACTULOSE) take 15ml by mouth once or twice daily as needed for constipation PRODIGY BLOOD GLUCOSE TEST  STRP (GLUCOSE BLOOD) use  to check blood sugar two times a day TRAMADOL HCL 50 MG TABS (TRAMADOL HCL) take 1-2 tablets every 8 hours as needed for pain FLECTOR 1.3 % PTCH (DICLOFENAC EPOLAMINE) Apply 1 patch twice a day in the affected joint.  Other patient instructions:  Please come for an appointment at the outpatient clinic at [facility] on 6/1 at 3:30 pm with Dr. Onalee Hua for a followup visit.  Please take your medication as prescribed below.  If you have any problem, Please call the clinic.   In case of an emergency  dial 911 or go to the emergency department.    Note: Hospital Discharge Medications & Other Instructions handout was printed, one copy for patient and a second copy to be placed in hospital chart.

## 2010-03-24 NOTE — Assessment & Plan Note (Signed)
Summary: recheck, wants referral/ pcp-alvarez/hla   Vital Signs:  Patient profile:   35 year old female Height:      62 inches Weight:      328.1 pounds BMI:     60.23 Temp:     97.0 degrees F oral Pulse rate:   92 / minute BP sitting:   147 / 98  (right arm)  Vitals Entered By: Filomena Jungling NT II (November 05, 2009 3:58 PM) CC: BILATERAL KNEES AND LEG PAIN/ NEED REFILLS  Is Patient Diabetic? Yes Did you bring your meter with you today? No Pain Assessment Patient in pain? yes     Location: KNEES, AND LEGS Intensity: 10 Type: aching Onset of pain  MONTHS Nutritional Status BMI of > 30 = obese CBG Result 266  Does patient need assistance? Functional Status Self care Ambulation Normal   CC:  BILATERAL KNEES AND LEG PAIN/ NEED REFILLS .  History of Present Illness: [name] is a 35 year old woman with pmh significant for DM poorly controlled, morbid obesity, sleep apnea supposed to be on CPAP, HLD, and chronic low back pain who is here for DM follow up.  1) DM II - States she is taking 100 units of lantus daily. She states she accidentally forgets to take her dose from time to time. CBGs at home are running between 160-180 before her vacation. After her vacation which was aug 25-sept 1st her sugars have been running 185-220. She mentions that her meter is not working properly and states her sugars are often normal. Seeing that her sugars are normal, pt states she increases her meal intake and drinks energy drinks.   2) Knee pain - from arthritis. Pt has seen Dr. Farris Has in the past where she got steroid injections.   3) Dysuria, frequency, feels like she has a UTI.    Preventive Screening-Counseling & Management  Alcohol-Tobacco     Smoking Status: quit     Packs/Day: 2 cigs/month     Year Started: AT TIMES     Year Quit: 5 months     Passive Smoke Exposure: no  Caffeine-Diet-Exercise     Does Patient Exercise: yes     Type of exercise: physical therapy/OT  Times/week: 3  Current Medications (verified): 1)  Albuterol 90 Mcg/act  Aers (Albuterol) .... Take 1-2 Puffs Every 4 Hours As Needed For Shortness of Breath, Wheezing or Cough 2)  Zovirax 200 Mg Caps (Acyclovir) .... Take 1 Capsule By Mouth Once A Day 3)  Pravachol 40 Mg  Tabs (Pravastatin Sodium) .... Take 1 Tablet By Mouth At Bedtime 4)  Famotidine 40 Mg Tabs (Famotidine) .... Take 1 Tablet By Mouth Once A Day 5)  Skelaxin 800 Mg  Tabs (Metaxalone) .... Take 1 Tablet By Mouth Three Times A Day As Needed 6)  Prodigy Lancets 28g  Misc (Lancets) .... Use As Directed To Check Sugar 7)  Advair Diskus 500-50 Mcg/dose Misc (Fluticasone-Salmeterol) .Marland Kitchen.. 1 Puff Two Times A Day 8)  Lantus 100 Unit/ml  Soln (Insulin Glargine) .... Inject 92 Units Into The Skin of The Abdomen At Bedtime 9)  Bd Insulin Syringe Ultrafine 31g X 5/16" 1 Ml  Misc (Insulin Syringe-Needle U-100) .... Use As Directed For Insulin Injections 10)  Glipizide 5 Mg  Tabs (Glipizide) .... Take 1 Tablet By Mouth Two Times A Day 11)  Prodigy Blood Glucose Monitor  Devi (Blood Glucose Monitoring Suppl) .... Use To Check Blood Sugar Two Times A Day 12)  Benefiber  Powd (Wheat Dextrin) .... Add 1 Scoop To Beverage of Choice Per Day 13)  Lactulose 10 Gm/88ml Soln (Lactulose) .... Take 15ml By Mouth Once or Twice Daily As Needed For Constipation 14)  Prodigy Blood Glucose Test  Strp (Glucose Blood) .... Use To Check Blood Sugar Two Times A Day 15)  Tramadol Hcl 50 Mg Tabs (Tramadol Hcl) .... Take 1-2 Tablets Every 8 Hours As Needed For Pain 16)  Flector 1.3 % Ptch (Diclofenac Epolamine) .... Apply 1 Patch Twice A Day in The Affected Joint. 17)  Ibuprofen 200 Mg Tabs (Ibuprofen) .... Takes 800mg  Three Times A Day As Needed. 18)  Fluconazole 150 Mg Tabs (Fluconazole) .... Take 1 Tablet By Mouth Once A Day  Allergies (verified): 1)  ! Pcn 2)  ! Doxycycline 3)  ! * Tomato  Past History:  Past Medical History: Last updated:  03/28/2008 Low back pain-Neg LS Xray 03/08/2003 GERD Hyperlipidemia Obstructive sleep apnea-CPAP at bedtime Carpal tunnel syndrome-B/L hand numbness; + nerve conduction study Type II diabetes mellitus-dx 12/2000, intolerant to metformin Genital herpes Shoulder pain-Right, neg xray 11/2002 Depression Asthma Medical Non-compliance Obesity  Social History: Last updated: 08/01/2007 Single, but lives with a partner Has 2 children and cares for an infant during the day Current smoker  Risk Factors: Exercise: yes (11/05/2009)  Risk Factors: Smoking Status: quit (11/05/2009) Packs/Day: 2 cigs/month (11/05/2009) Passive Smoke Exposure: no (11/05/2009)  Review of Systems      See HPI  Physical Exam  General:  alert and well-developed.   Head:  normocephalic and atraumatic.   Neck:  supple.   Lungs:  normal respiratory effort and normal breath sounds.   Heart:  normal rate and regular rhythm.   Abdomen:  obese, NT Msk:  crepitation bilateral knee joints no swelling or warmth appreciated  Extremities:  no LE edema present    Impression & Recommendations:  Problem # 1:  DIABETES MELLITUS, TYPE II (ICD-250.00) Assessment Unchanged Pt is on a high dose of lantus and glipizide and despite being on a high dose, patient's A1C and CBGs are still running high. Upon further questioning, patient states she is not compliant with a diabetic diet, and often drinks energy drinks, and consumes foods high in carbs. She is NOT willing to start sliding scale coverage. She states there was about 1 week where she ran out of her insulin, and few days where she forgets to take her insulin. She believes this is contributing to her poor control of her diabetes. She states she is going to change her diet, and get a new meter to download her sugars more accurately. Main goal in this patient is to increase compliance.   Her updated medication list for this problem includes:    Lantus 100 Unit/ml Soln  (Insulin glargine) ..... Inject 92 units into the skin of the abdomen at bedtime    Glipizide 5 Mg Tabs (Glipizide) .Marland Kitchen... Take 1 tablet by mouth two times a day  Orders: T- Capillary Blood Glucose (24401) T-Hgb A1C (in-house) (02725DG)  Labs Reviewed: Creat: 0.62 (04/01/2009)    Reviewed HgBA1c results: 11.3 (11/05/2009)  11.6 (07/23/2009)  Problem # 2:  KNEE PAIN (ICD-719.46) Secondary to arthritis. Once again weight loss was stressed to patient, but she can barely move, therefore exercise is not much of an option. Will send pt to sports medicine again for strengthening, physical therapy and steroid injection. Toradol shot today for temp pain relief.   Her updated medication list for this problem includes:  Skelaxin 800 Mg Tabs (Metaxalone) .Marland Kitchen... Take 1 tablet by mouth three times a day as needed    Tramadol Hcl 50 Mg Tabs (Tramadol hcl) .Marland Kitchen... Take 1-2 tablets every 8 hours as needed for pain    Ibuprofen 200 Mg Tabs (Ibuprofen) .Marland Kitchen... Takes 800mg  three times a day as needed.  Orders: Sports Medicine (Sports Med)  Problem # 3:  DYSURIA (ICD-788.1) Sent urine specimen for UA, will get culture if UA is positive and treat accordingly.   Orders: T-Urinalysis ([id number])  Problem # 4:  CANDIDIASIS, VAGINAL (ICD-112.1)  Secondary to poorly controlled DM.  Will give diflucan.  Her updated medication list for this problem includes:    Fluconazole 150 Mg Tabs (Fluconazole) .Marland Kitchen... Take 1 tablet by mouth once a day  Her updated medication list for this problem includes:    Fluconazole 150 Mg Tabs (Fluconazole) .Marland Kitchen... Take 1 tablet by mouth once a day  Complete Medication List: 1)  Albuterol 90 Mcg/act Aers (Albuterol) .... Take 1-2 puffs every 4 hours as needed for shortness of breath, wheezing or cough 2)  Zovirax 200 Mg Caps (Acyclovir) .... Take 1 capsule by mouth once a day 3)  Pravachol 40 Mg Tabs (Pravastatin sodium) .... Take 1 tablet by mouth at bedtime 4)  Famotidine 40 Mg  Tabs (Famotidine) .... Take 1 tablet by mouth once a day 5)  Skelaxin 800 Mg Tabs (Metaxalone) .... Take 1 tablet by mouth three times a day as needed 6)  Prodigy Lancets 28g Misc (Lancets) .... Use as directed to check sugar 7)  Advair Diskus 500-50 Mcg/dose Misc (Fluticasone-salmeterol) .Marland Kitchen.. 1 puff two times a day 8)  Lantus 100 Unit/ml Soln (Insulin glargine) .... Inject 92 units into the skin of the abdomen at bedtime 9)  Bd Insulin Syringe Ultrafine 31g X 5/16" 1 Ml Misc (Insulin syringe-needle u-100) .... Use as directed for insulin injections 10)  Glipizide 5 Mg Tabs (Glipizide) .... Take 1 tablet by mouth two times a day 11)  Prodigy Blood Glucose Monitor Devi (Blood glucose monitoring suppl) .... Use to check blood sugar two times a day 12)  Benefiber Powd (Wheat dextrin) .... Add 1 scoop to beverage of choice per day 13)  Lactulose 10 Gm/43ml Soln (Lactulose) .... Take 15ml by mouth once or twice daily as needed for constipation 14)  Prodigy Blood Glucose Test Strp (Glucose blood) .... Use to check blood sugar two times a day 15)  Tramadol Hcl 50 Mg Tabs (Tramadol hcl) .... Take 1-2 tablets every 8 hours as needed for pain 16)  Flector 1.3 % Ptch (Diclofenac epolamine) .... Apply 1 patch twice a day in the affected joint. 17)  Ibuprofen 200 Mg Tabs (Ibuprofen) .... Takes 800mg  three times a day as needed. 18)  Fluconazole 150 Mg Tabs (Fluconazole) .... Take 1 tablet by mouth once a day  Patient Instructions: 1)  Please schedule a follow-up appointment in 3 months. 2)  Check your blood sugars regularly. If your readings are usually above 200 : or below 70 you should contact our office. Prescriptions: FLUCONAZOLE 150 MG TABS (FLUCONAZOLE) Take 1 tablet by mouth once a day  #1 x 1   Entered and Authorized by:   Melida Quitter MD   Signed by:   Melida Quitter MD on 11/05/2009   Method used:   Electronically to        [facility] (retail)       [address]  [place], [place]  [address]       Ph: [phone]       Fax: [phone]   RxID:   [id number] LACTULOSE 10 GM/15ML SOLN (LACTULOSE) take 15ml by mouth once or twice daily as needed for constipation  #150 ml x 0   Entered and Authorized by:   Melida Quitter MD   Signed by:   Melida Quitter MD on 11/05/2009   Method used:   Electronically to        [facility] (retail)       [address]       [place], [place]  [address]       Ph: [phone]       Fax: [phone]   RxID:   [id number] GLIPIZIDE 5 MG  TABS (GLIPIZIDE) Take 1 tablet by mouth two times a day  #60 Tablet x 3   Entered and Authorized by:   Melida Quitter MD   Signed by:   Melida Quitter MD on 11/05/2009   Method used:   Electronically to        [facility] (retail)       [address]       [place], [place]  [address]       Ph: [phone]       Fax: [phone]   RxID:   [id number] ADVAIR DISKUS 500-50 MCG/DOSE MISC (FLUTICASONE-SALMETEROL) 1 puff two times a day  #1 x 6   Entered and Authorized by:   Melida Quitter MD   Signed by:   Melida Quitter MD on 11/05/2009   Method used:   Electronically to        [facility] (retail)       [address]       [place], [place]  [address]       Ph: [phone]       Fax: [phone]   RxID:   [id number] SKELAXIN 800 MG  TABS (METAXALONE) Take 1 tablet by mouth three times a day as needed  #31 x 0   Entered and Authorized by:   Melida Quitter MD   Signed by:   Melida Quitter MD on 11/05/2009   Method used:   Electronically to        [facility] (retail)       [address]       [place], [place]  [address]       Ph: [phone]       Fax: [phone]   RxID:   [id number]   Prevention & Chronic Care Immunizations   Influenza vaccine: Fluvax Non-MCR  (12/18/2007)   Influenza  vaccine deferral: Deferred  (04/01/2009)    Tetanus booster: Not documented   Td booster deferral: Deferred  (04/01/2009)    Pneumococcal vaccine: Pneumovax  (04/17/2008)   Pneumococcal vaccine deferral: Deferred  (04/01/2009)  Other Screening   Pap smear: NEGATIVE FOR INTRAEPITHELIAL LESIONS OR MALIGNANCY.  (04/11/2008)   Smoking status: quit  (11/05/2009)  Diabetes Mellitus   HgbA1C: 11.3  (11/05/2009)    Eye exam: Not documented   Diabetic eye exam action/deferral: Ophthalmology referral  (11/25/2008)    Foot exam: yes  (11/25/2008)   Foot exam action/deferral: Do today   High risk foot:  Not documented   Foot care education: Not documented    Urine microalbumin/creatinine ratio: 7.9  (08/02/2007)    Diabetes flowsheet reviewed?: Yes   Progress toward A1C goal: Unchanged  Lipids   Total Cholesterol: 157  (04/01/2009)   Lipid panel action/deferral: Lipid Panel ordered   LDL: 77  (04/01/2009)   LDL Direct: Not documented   HDL: 48  (04/01/2009)   Triglycerides: 161  (04/01/2009)    SGOT (AST): 16  (04/01/2009)   BMP action: Ordered   SGPT (ALT): 23  (04/01/2009)   Alkaline phosphatase: 40  (04/01/2009)   Total bilirubin: 0.4  (04/01/2009)    Lipid flowsheet reviewed?: Yes   Progress toward LDL goal: At goal  Self-Management Support :   Personal Goals (by the next clinic visit) :     Personal A1C goal: 8  (04/01/2009)     Personal blood pressure goal: 130/80  (04/01/2009)     Personal LDL goal: 70  (04/01/2009)    Patient will work on the following items until the next clinic visit to reach self-care goals:     Medications and monitoring: take my medicines every day, examine my feet every day  (11/05/2009)     Eating: drink diet soda or water instead of juice or soda, eat more vegetables, use fresh or frozen vegetables, eat foods that are low in salt, eat baked foods instead of fried foods, eat fruit for snacks and desserts  (11/05/2009)     Activity: take a 30  minute walk every day  (09/03/2009)    Diabetes self-management support: Education handout, Resources for patients handout  (11/05/2009)   Diabetes education handout printed    Lipid self-management support: Education handout, Resources for patients handout  (11/05/2009)     Lipid education handout printed      Resource handout printed.   Nursing Instructions: Give Flu vaccine today   Laboratory Results   Blood Tests   Date/Time Received: November 05, 2009 4:33 PM Date/Time Reported: Alric Quan  November 05, 2009 4:33 PM   HGBA1C: 11.3%   (Normal Range: Non-Diabetic - 3-6%   Control Diabetic - 6-8%) CBG Random:: 266mg /dL     Appended Document: recheck, wants referral/ pcp-alvarez/hla   Immunizations Administered:  Influenza Vaccine # 1:    Vaccine Type: Fluvax Non-MCR    Site: left deltoid    Mfr: GlaxoSmithKline    Dose: 0.5 ml    Route: IM    Given by: Angelina Ok RN    Exp. Date: 08/22/2010    Lot #: [id number]    VIS given: 09/16/09 version given November 05, 2009.  Flu Vaccine Consent Questions:    Do you have a history of severe allergic reactions to this vaccine? no    Any prior history of allergic reactions to egg and/or gelatin? no    Do you have a sensitivity to the preservative Thimersol? no    Do you have a past history of Guillan-Barre Syndrome? no    Do you currently have an acute febrile illness? no    Have you ever had a severe reaction to latex? no    Vaccine information given and explained to patient? yes    Are you currently pregnant? no     Medication Administration  Injection # 1:    Medication: Ketorolac-Toradol 15mg     Diagnosis: KNEE PAIN (JYN-829.56)    Route: IM    Site: R deltoid    Exp Date: 05/2011    Lot #: [id number]  Mfr: [email or web address]    Comments: Given 30 mg IM    Patient tolerated injection without complications    Given by: Angelina Ok RN (November 05, 2009 5:41 PM)  Orders Added: 1)  Influenza Vaccine  NON MCR [00028] 2)  Ketorolac-Toradol 15mg  [J1885] 3)  Admin of Therapeutic Inj  intramuscular or subcutaneous [69629]

## 2010-03-24 NOTE — Progress Notes (Signed)
Summary: EA:VWUJ appt//kg  Phone Note Outgoing Call   Summary of Call: Had received call from pt that she was never received message on her recorder regarding orthopedic appt with Dr Farris Has.  Dr Farris Has has appt avail for tomorrow 4/20 at 10:30am.  Attempted to contact pt with appt and her mailbox is full, unable to lve message.

## 2010-03-24 NOTE — Consult Note (Signed)
[facility]   Imported By: Margie Billet 08/20/2009 15:21:22  _____________________________________________________________________  External Attachment:    Type:   Image     Comment:   External Document

## 2010-03-24 NOTE — Progress Notes (Signed)
Summary: broken insulin/ hla  Phone Note Call from Patient   Summary of Call: pt called and left message re: insulin, could not understand message, called pt back 3x before i was finally able to speak w/ her. she had broken the vial of ins in the fridge,. [facility] spoke w/ pharm, he states medicaid usually will pay for extra ins a few times a yr if there is an accident. i informed pt to go now to pharm and get new vial, she states she is on her way Initial call taken by: Marin Roberts RN,  September 19, 2009 5:28 PM

## 2010-03-24 NOTE — Progress Notes (Signed)
Summary: med refill/gp  Phone Note Refill Request Message from:  Fax from Pharmacy on January 07, 2010 9:29 AM  Refills Requested: Medication #1:  LANTUS 100 UNIT/ML  SOLN Inject 92 units into the skin of the abdomen at bedtime Pharmacy states Rx was transfer to [place] and they cannot transfer it back to [place] d/t their law. Last appt. 11/05/09.   Method Requested: Electronic Initial call taken by: Chinita Pester RN,  January 07, 2010 9:30 AM  Follow-up for Phone Call        Rx faxed to pharmacy Follow-up by: Mariea Stable MD,  January 07, 2010 3:01 PM    Prescriptions: LANTUS 100 UNIT/ML  SOLN (INSULIN GLARGINE) Inject 92 units into the skin of the abdomen at bedtime  #3 vials x 6   Entered and Authorized by:   Mariea Stable MD   Signed by:   Mariea Stable MD on 01/07/2010   Method used:   Electronically to        [facility] (retail)       [address]       [place], [place]  [address]       Ph: [phone]       Fax: [phone]   RxID:   [id number]

## 2010-03-24 NOTE — Miscellaneous (Signed)
[facility]: [facility]   Imported By: Margie Billet 08/05/2009 11:33:23  _____________________________________________________________________  External Attachment:    Type:   Image     Comment:   External Document

## 2010-03-24 NOTE — Assessment & Plan Note (Signed)
Summary: F/U [facility]   Vital Signs:  Patient profile:   35 year old female Weight:      316.4 pounds BMI:     58.08 Pulse rate:   99 / minute BP sitting:   108 / 71  (right arm)  Vitals Entered By: Rochele Pages RN (January 06, 2010 10:32 AM) CC: f/u rt knee pain, lt knee pain worse than rt and low back pain   History of Present Illness: Mostly better, about 85-90% better with the R knee. Still with some aching, occ joint line pain and minimal swelling. L side knee mildly bothering her as well after recent trip with a fair amount of walking in [place]  Lost 12 pounds  PRIOR OV: 35 yo F with morbid obestity, BMI = 60,  here for bilateral Knee pain.  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   Had a fall  5.10.11 where she was sleep walking, woke up in a split position, hurting her R hip and dropping to the ground on bilateral  knees.   Today, she is having bilateral knee pain. R knee is the worst pain today,  Left knee is painful but not as bad as R.   - R knee worsening ever since the injury   - taking Tramadol with no relief.  has tried ibuprofen in past as well   Underwent home PT after her hospital stay, 3 X a week.  Occupational therapy also came help use her cain, help her wash clothes etc.   Had Injection to her R knee approx 1.5 years ago with some relief.      Preventive Screening-Counseling & Management  Alcohol-Tobacco     Smoking Status: quit > 6 months  Allergies: 1)  ! Pcn 2)  ! Doxycycline 3)  ! * Tomato  Past History:  Past medical, surgical, family and social histories (including risk factors) reviewed, and no changes noted (except as noted below).  Past Medical History: Reviewed history from 03/28/2008 and no changes required. Low back pain-Neg LS Xray 03/08/2003 GERD Hyperlipidemia Obstructive sleep apnea-CPAP at bedtime Carpal tunnel syndrome-B/L hand numbness; + nerve conduction study Type II diabetes mellitus-dx 12/2000, intolerant  to metformin Genital herpes Shoulder pain-Right, neg xray 11/2002 Depression Asthma Medical Non-compliance Obesity  Family History: Reviewed history and no changes required.  Social History: Reviewed history from 08/01/2007 and no changes required. Single, but lives with a partner Has 2 children and cares for an infant during the day Current smoker  Smoking Status:  quit > 6 months  Review of Systems       REVIEW OF SYSTEMS  GEN: No systemic complaints, no fevers, chills, sweats, or other acute illnesses MSK: Detailed in the HPI GI: tolerating PO intake without difficulty Otherwise the pertinent positives of the ROS are noted above.    Physical Exam  General:  Well-developed,well-nourished,in no acute distress; alert,appropriate and cooperative throughout examination, morbidly obese Head:  Normocephalic and atraumatic without obvious abnormalities. No apparent alopecia or balding. Msk:  R knee  Mild effusion present. full ext, flexion to 115.  Intact verus, valgus stress testing, ACL, PCL on ant / post drawer. firm end points.     Much improved exam, nt mcmurrays  L knee - full rom in flexion and extenion.  no effusion  / echymosis.  crepitus is felt with flexion.  Non-tender to palpation of jointline, patella, tibial tuberosity, popliteal fossa.     Impression & Recommendations:  Problem #  1:  KNEE PAIN (ICD-719.46) much improved with significant baseline OA, R could have had meniscal pathology - regardless much better c/w NSAIDS, tramadol, ice, tylenol wt loss key   may follow up in a couple of months prior to cruise  Her updated medication list for this problem includes:    Skelaxin 800 Mg Tabs (Metaxalone) .Marland Kitchen... Take 1 tablet by mouth three times a day as needed    Tramadol Hcl 50 Mg Tabs (Tramadol hcl) .Marland Kitchen... Take 1-2 tablets every 8 hours as needed for pain    Ibuprofen 200 Mg Tabs (Ibuprofen) .Marland Kitchen... Takes 800mg  three times a day as needed.  Complete  Medication List: 1)  Albuterol 90 Mcg/act Aers (Albuterol) .... Take 1-2 puffs every 4 hours as needed for shortness of breath, wheezing or cough 2)  Zovirax 200 Mg Caps (Acyclovir) .... Take 1 capsule by mouth once a day 3)  Pravachol 40 Mg Tabs (Pravastatin sodium) .... Take 1 tablet by mouth at bedtime 4)  Famotidine 40 Mg Tabs (Famotidine) .... Take 1 tablet by mouth once a day 5)  Skelaxin 800 Mg Tabs (Metaxalone) .... Take 1 tablet by mouth three times a day as needed 6)  Prodigy Lancets 28g Misc (Lancets) .... Use as directed to check sugar 7)  Advair Diskus 500-50 Mcg/dose Misc (Fluticasone-salmeterol) .Marland Kitchen.. 1 puff two times a day 8)  Lantus 100 Unit/ml Soln (Insulin glargine) .... Inject 92 units into the skin of the abdomen at bedtime 9)  Bd Insulin Syringe Ultrafine 31g X 5/16" 1 Ml Misc (Insulin syringe-needle u-100) .... Use as directed for insulin injections 10)  Glipizide 5 Mg Tabs (Glipizide) .... Take 1 tablet by mouth two times a day 11)  Prodigy Blood Glucose Monitor Devi (Blood glucose monitoring suppl) .... Use to check blood sugar two times a day 12)  Benefiber Powd (Wheat dextrin) .... Add 1 scoop to beverage of choice per day 13)  Lactulose 10 Gm/59ml Soln (Lactulose) .... Take 15ml by mouth once or twice daily as needed for constipation 14)  Prodigy Blood Glucose Test Strp (Glucose blood) .... Use to check blood sugar two times a day 15)  Tramadol Hcl 50 Mg Tabs (Tramadol hcl) .... Take 1-2 tablets every 8 hours as needed for pain 16)  Flector 1.3 % Ptch (Diclofenac epolamine) .... Apply 1 patch twice a day in the affected joint. 17)  Ibuprofen 200 Mg Tabs (Ibuprofen) .... Takes 800mg  three times a day as needed. 18)  Fluconazole 150 Mg Tabs (Fluconazole) .... Take 1 tablet by mouth once a day 19)  Bactrim Ds 800-160 Mg Tabs (Sulfamethoxazole-trimethoprim) .... Take one (1) by mouth twice a day for 3 days   Orders Added: 1)  Est. Patient Level III [[address]]

## 2010-03-24 NOTE — Miscellaneous (Signed)
Summary: Assessment & Plan For Eval  Assessment & Plan For Eval   Imported By: Florinda Marker 04/18/2009 16:46:13  _____________________________________________________________________  External Attachment:    Type:   Image     Comment:   External Document

## 2010-03-24 NOTE — Assessment & Plan Note (Signed)
Summary: HFU-TOBBIA/CFB [phone]/CFB   Vital Signs:  Patient profile:   35 year old female Height:      62 inches (157.48 cm) Weight:      322.5 pounds (146.59 kg) BMI:     59.20 Temp:     97.4 degrees F (36.33 degrees C) oral Pulse rate:   89 / minute BP sitting:   130 / 82  (right arm) Cuff size:   small  Vitals Entered By: Theotis Barrio NT II (July 23, 2009 3:43 PM) CC: follow-up visit from hospital stay./ medication refills/ bilateral leg examination Is Patient Diabetic? Yes Pain Assessment Patient in pain? yes     Location: knees and lower back Intensity: 8/9 Type: numbness/burning Onset of pain  May 10, 201/patientstates she fell Nutritional Status BMI of > 30 = obese CBG Result 208  Have you ever been in a relationship where you felt threatened, hurt or afraid?No   Does patient need assistance? Functional Status Self care Ambulation Wheelchair Comments follow-up visit from hospital stay.   CC:  follow-up visit from hospital stay./ medication refills/ bilateral leg examination.  History of Present Illness: Ana Long is a 35 yo woman with PMH as outlined below.  She is here for HFU after fall with multiple strains apparently during sleep walking.  She is working with PT/OT and is doing pretty good.  Using walker, took 68 steps and able to transfer herself.  Still requires help around house.    Currently using lantus 70, glipizide 5mg  two times a day.  Sugars running 155-200s.  No hypoglycemia nor symptoms.      Depression History:      The patient denies a depressed mood most of the day and a diminished interest in her usual daily activities.         Preventive Screening-Counseling & Management  Alcohol-Tobacco     Smoking Status: quit     Packs/Day: 2 cigs/month     Year Started: AT TIMES     Year Quit: 5 months     Passive Smoke Exposure: no  Caffeine-Diet-Exercise     Does Patient Exercise: yes     Type of exercise: physical therapy/OT  Times/week: 3  Current Medications (verified): 1)  Albuterol 90 Mcg/act  Aers (Albuterol) .... Take 1-2 Puffs Every 4 Hours As Needed For Shortness of Breath, Wheezing or Cough 2)  Zovirax 200 Mg Caps (Acyclovir) .... Take 1 Capsule By Mouth Once A Day 3)  Pravachol 40 Mg  Tabs (Pravastatin Sodium) .... Take 1 Tablet By Mouth At Bedtime 4)  Famotidine 40 Mg Tabs (Famotidine) .... Take 1 Tablet By Mouth Once A Day 5)  Skelaxin 800 Mg  Tabs (Metaxalone) .... Take 1 Tablet By Mouth Three Times A Day As Needed 6)  Bl Lancets Super Thin   Misc (Lancets) .... Use As Directed. 7)  Advair Diskus 500-50 Mcg/dose Misc (Fluticasone-Salmeterol) .Marland Kitchen.. 1 Puff Two Times A Day 8)  Lantus 100 Unit/ml  Soln (Insulin Glargine) .... Inject 70 Units Into The Skin of The Abdomen At Bedtime 9)  Bd Insulin Syringe Ultrafine 31g X 5/16" 1 Ml  Misc (Insulin Syringe-Needle U-100) .... Use As Directed For Insulin Injections 10)  Glipizide 5 Mg  Tabs (Glipizide) .... Take 1 Tablet By Mouth Two Times A Day 11)  Prodigy Blood Glucose Monitor  Devi (Blood Glucose Monitoring Suppl) .... Use To Check Blood Sugar Two Times A Day 12)  Benefiber  Powd (Wheat Dextrin) .... Add 1 Scoop To  Beverage of Choice Per Day 13)  Lactulose 10 Gm/8ml Soln (Lactulose) .... Take 15ml By Mouth Once or Twice Daily As Needed For Constipation 14)  Prodigy Blood Glucose Test  Strp (Glucose Blood) .... Use To Check Blood Sugar Two Times A Day 15)  Tramadol Hcl 50 Mg Tabs (Tramadol Hcl) .... Take 1-2 Tablets Every 8 Hours As Needed For Pain 16)  Flector 1.3 % Ptch (Diclofenac Epolamine) .... Apply 1 Patch Twice A Day in The Affected Joint. 17)  Cleocin 300 Mg Caps (Clindamycin Hcl) .... Take 1 Tablet By Mouth Four Times A Day 18)  Ibuprofen 200 Mg Tabs (Ibuprofen) .... Takes 800mg  Three Times A Day As Needed.  Allergies (verified): 1)  ! Pcn 2)  ! Doxycycline 3)  ! * Tomato  Past History:  Past Medical History: Last updated: 03/28/2008 Low  back pain-Neg LS Xray 03/08/2003 GERD Hyperlipidemia Obstructive sleep apnea-CPAP at bedtime Carpal tunnel syndrome-B/L hand numbness; + nerve conduction study Type II diabetes mellitus-dx 12/2000, intolerant to metformin Genital herpes Shoulder pain-Right, neg xray 11/2002 Depression Asthma Medical Non-compliance Obesity  Social History: Last updated: 08/01/2007 Single, but lives with a partner Has 2 children and cares for an infant during the day Current smoker  Risk Factors: Smoking Status: quit (07/23/2009) Packs/Day: 2 cigs/month (07/23/2009) Passive Smoke Exposure: no (07/23/2009)  Review of Systems      See HPI  Physical Exam  General:  obese, appears to be in mod distress 2/2 pain. Eyes:  anicteric Neck:  supple Lungs:  normal respiratory effort, no accessory muscle use, no crackles, and no wheezes.   Heart:  normal rate, regular rhythm, no gallop, no rub, and no JVD.  grade 1/6 systolic murmur Abdomen:  obese, soft, non-tender, and normal bowel sounds.  no distention and no rebound tenderness.   Extremities:  no edema Neurologic:  alert & oriented X3, cranial nerves II-XII intact, strength normal in all extremities, sensation intact to pinprick, and gait normal.   Psych:  Oriented X3.     Impression & Recommendations:  Problem # 1:  LEG EDEMA, BILATERAL (ICD-782.3) ted hose  Problem # 2:  DIABETES MELLITUS, TYPE II (ICD-250.00)  self titrate as per pt instructions bring meter in next visit.  Her updated medication list for this problem includes:    Lantus 100 Unit/ml Soln (Insulin glargine) ..... Inject 70 units into the skin of the abdomen at bedtime    Glipizide 5 Mg Tabs (Glipizide) .Marland Kitchen... Take 1 tablet by mouth two times a day  Labs Reviewed: Creat: 0.62 (04/01/2009)    Reviewed HgBA1c results: 11.6 (07/23/2009)  10.6 (04/01/2009)  Problem # 3:  HYPERLIPIDEMIA (ICD-272.4) at goal  Her updated medication list for this problem includes:     Pravachol 40 Mg Tabs (Pravastatin sodium) .Marland Kitchen... Take 1 tablet by mouth at bedtime  Labs Reviewed: SGOT: 16 (04/01/2009)   SGPT: 23 (04/01/2009)   HDL:48 (04/01/2009), 52 (03/28/2008)  LDL:77 (04/01/2009), 69 (03/28/2008)  Chol:157 (04/01/2009), 142 (03/28/2008)  Trig:161 (04/01/2009), 103 (03/28/2008)  Problem # 4:  SLEEP APNEA (ICD-780.57) reports using CPAP  Problem # 5:  LOW BACK PAIN, CHRONIC (ICD-724.2) continue pain regimen below. PT/OT per sprain and hospital discharge instructions  Her updated medication list for this problem includes:    Skelaxin 800 Mg Tabs (Metaxalone) .Marland Kitchen... Take 1 tablet by mouth three times a day as needed    Tramadol Hcl 50 Mg Tabs (Tramadol hcl) .Marland Kitchen... Take 1-2 tablets every 8 hours as needed for pain  Ibuprofen 200 Mg Tabs (Ibuprofen) .Marland Kitchen... Takes 800mg  three times a day as needed.  Problem # 6:  MORBID OBESITY (ICD-278.01) lengthy discussion regarding weight loss. calorie log to be recorded. decrease caloric intake by 500 calories/day to bring log in to clinic.  greater than face to face  Complete Medication List: 1)  Albuterol 90 Mcg/act Aers (Albuterol) .... Take 1-2 puffs every 4 hours as needed for shortness of breath, wheezing or cough 2)  Zovirax 200 Mg Caps (Acyclovir) .... Take 1 capsule by mouth once a day 3)  Pravachol 40 Mg Tabs (Pravastatin sodium) .... Take 1 tablet by mouth at bedtime 4)  Famotidine 40 Mg Tabs (Famotidine) .... Take 1 tablet by mouth once a day 5)  Skelaxin 800 Mg Tabs (Metaxalone) .... Take 1 tablet by mouth three times a day as needed 6)  Bl Lancets Super Thin Misc (Lancets) .... Use as directed. 7)  Advair Diskus 500-50 Mcg/dose Misc (Fluticasone-salmeterol) .Marland Kitchen.. 1 puff two times a day 8)  Lantus 100 Unit/ml Soln (Insulin glargine) .... Inject 70 units into the skin of the abdomen at bedtime 9)  Bd Insulin Syringe Ultrafine 31g X 5/16" 1 Ml Misc (Insulin syringe-needle u-100) .... Use as directed for insulin  injections 10)  Glipizide 5 Mg Tabs (Glipizide) .... Take 1 tablet by mouth two times a day 11)  Prodigy Blood Glucose Monitor Devi (Blood glucose monitoring suppl) .... Use to check blood sugar two times a day 12)  Benefiber Powd (Wheat dextrin) .... Add 1 scoop to beverage of choice per day 13)  Lactulose 10 Gm/96ml Soln (Lactulose) .... Take 15ml by mouth once or twice daily as needed for constipation 14)  Prodigy Blood Glucose Test Strp (Glucose blood) .... Use to check blood sugar two times a day 15)  Tramadol Hcl 50 Mg Tabs (Tramadol hcl) .... Take 1-2 tablets every 8 hours as needed for pain 16)  Flector 1.3 % Ptch (Diclofenac epolamine) .... Apply 1 patch twice a day in the affected joint. 17)  Cleocin 300 Mg Caps (Clindamycin hcl) .... Take 1 tablet by mouth four times a day 18)  Ibuprofen 200 Mg Tabs (Ibuprofen) .... Takes 800mg  three times a day as needed.  Other Orders: T- Capillary Blood Glucose (82948) T-Hgb A1C (in-house) ([address]UE)  Patient Instructions: 1)  Please schedule a follow-up appointment in 1 month. 2)  Check fasting glucose daily. 3)  If fasting CBG > 180 for 3 days in a row, increase lanuts by 4 units. 4)  If fasting CBG is between 130 - 180 for 3 days in a row, then increase lantus by 2 units. 5)  If fasting CBG is < 130, do not increase any further.  6)  Bring meter in for next visit. 7)  If you have any problems before your next visit, call clinic.  Prescriptions: TRAMADOL HCL 50 MG TABS (TRAMADOL HCL) take 1-2 tablets every 8 hours as needed for pain  #120 x 2   Entered and Authorized by:   Mariea Stable MD   Signed by:   Mariea Stable MD on 07/23/2009   Method used:   Electronically to        [facility] (retail)       [address]       [place], [place]  [address]       Ph: [phone]       Fax: [phone]   RxID:  [id number] LANTUS 100 UNIT/ML  SOLN (INSULIN GLARGINE) Inject 70 units into the  skin of the abdomen at bedtime  #3 x 3   Entered and Authorized by:   Mariea Stable MD   Signed by:   Mariea Stable MD on 07/23/2009   Method used:   Electronically to        [facility] (retail)       [address]       [place], [place]  [address]       Ph: [phone]       Fax: [phone]   RxID:   [id number] ADVAIR DISKUS 500-50 MCG/DOSE MISC (FLUTICASONE-SALMETEROL) 1 puff two times a day  #1 x 6   Entered and Authorized by:   Mariea Stable MD   Signed by:   Mariea Stable MD on 07/23/2009   Method used:   Electronically to        [facility] (retail)       [address]       [place], [place]  [address]       Ph: [phone]       Fax: [phone]   RxID:   [id number]    Prevention & Chronic Care Immunizations   Influenza vaccine: Fluvax Non-MCR  (12/18/2007)   Influenza vaccine deferral: Deferred  (04/01/2009)    Tetanus booster: Not documented   Td booster deferral: Deferred  (04/01/2009)    Pneumococcal vaccine: Pneumovax  (04/17/2008)   Pneumococcal vaccine deferral: Deferred  (04/01/2009)  Other Screening   Pap smear: NEGATIVE FOR INTRAEPITHELIAL LESIONS OR MALIGNANCY.  (04/11/2008)   Smoking status: quit  (07/23/2009)  Diabetes Mellitus   HgbA1C: 11.6  (07/23/2009)    Eye exam: Not documented   Diabetic eye exam action/deferral: Ophthalmology referral  (11/25/2008)    Foot exam: yes  (11/25/2008)   Foot exam action/deferral: Do today   High risk foot: Not documented   Foot care education: Not documented    Urine microalbumin/creatinine ratio: 7.9  (08/02/2007)  Lipids   Total Cholesterol: 157  (04/01/2009)   Lipid panel action/deferral: Lipid Panel ordered   LDL: 77  (04/01/2009)   LDL Direct: Not documented   HDL: 48  (04/01/2009)   Triglycerides: 161  (04/01/2009)    SGOT (AST): 16  (04/01/2009)   BMP action: Ordered   SGPT (ALT): 23   (04/01/2009)   Alkaline phosphatase: 40  (04/01/2009)   Total bilirubin: 0.4  (04/01/2009)  Self-Management Support :   Personal Goals (by the next clinic visit) :     Personal A1C goal: 8  (04/01/2009)     Personal blood pressure goal: 130/80  (04/01/2009)     Personal LDL goal: 70  (04/01/2009)    Patient will work on the following items until the next clinic visit to reach self-care goals:     Medications and monitoring: take my medicines every day, check my blood sugar, examine my feet every day  (07/23/2009)     Eating: drink diet soda or water instead of juice or soda, eat more vegetables, eat foods that are low in salt, eat baked foods instead of fried foods, eat fruit for snacks and desserts, limit or avoid alcohol  (07/23/2009)     Activity: take a 30 minute walk every day  (11/25/2008)    Diabetes self-management support: Pre-printed educational material, Resources for patients handout, Written self-care plan  (  05/20/2009)    Lipid self-management support: Pre-printed educational material, Resources for patients handout, Written self-care plan, Education handout  (05/20/2009)    Laboratory Results   Blood Tests   Date/Time Received: July 23, 2009 4:11 PM  Date/Time Reported: Burke Keels  July 23, 2009 4:11 PM   HGBA1C: 11.6%   (Normal Range: Non-Diabetic - 3-6%   Control Diabetic - 6-8%) CBG Random:: 208mg /dL

## 2010-03-24 NOTE — Progress Notes (Signed)
Summary: med refill/gp  Phone Note Refill Request Message from:  Fax from Pharmacy on May 12, 2009 10:16 AM  Refills Requested: Medication #1:  GLIPIZIDE 5 MG  TABS Take 1 tablet by mouth two times a day   Last Refilled: 04/15/2009  Method Requested: Electronic Initial call taken by: Chinita Pester RN,  May 12, 2009 10:16 AM  Follow-up for Phone Call        Rx faxed to pharmacy Follow-up by: Mariea Stable MD,  May 13, 2009 9:22 AM    Prescriptions: GLIPIZIDE 5 MG  TABS (GLIPIZIDE) Take 1 tablet by mouth two times a day  #60 Tablet x 3   Entered and Authorized by:   Mariea Stable MD   Signed by:   Mariea Stable MD on 05/13/2009   Method used:   Electronically to        [facility] (retail)       [address]       [place], [place]  [address]       Ph: [phone]       Fax: [phone]   RxID:   [id number]

## 2010-03-24 NOTE — Progress Notes (Signed)
Summary: Refill/gh  Phone Note Refill Request Message from:  Fax from Pharmacy on June 16, 2009 9:22 AM  Refills Requested: Medication #1:  ZOVIRAX 200 MG CAPS Take 1 capsule by mouth once a day   Last Refilled: 04/21/2009  Method Requested: Electronic Initial call taken by: Angelina Ok RN,  June 16, 2009 9:23 AM  Follow-up for Phone Call        will refill for 1 month but pt needs to be re-evaluated to see how many outbreaks she was having before starting suppressive therapy.  Furthermore, acyclovir 200mg  daily is not the recommended dose for suppressive therapy. Follow-up by: Mariea Stable MD,  June 16, 2009 3:16 PM    Prescriptions: ZOVIRAX 200 MG CAPS (ACYCLOVIR) Take 1 capsule by mouth once a day  #30 Capsule x 0   Entered and Authorized by:   Mariea Stable MD   Signed by:   Mariea Stable MD on 06/16/2009   Method used:   Electronically to        [facility] [address] [phone]* (retail)       [address]       [place], [place]  [address]       Ph: [phone] or [phone]       Fax: [phone]   RxID:   [id number]   Appended Document: Refill/gh Spoke with this patient and she has agreed t follow up with Dr. Onalee Hua on 07/02/2009.

## 2010-03-24 NOTE — Miscellaneous (Signed)
[facility]   Imported By: Margie Billet 08/05/2009 15:09:47  _____________________________________________________________________  External Attachment:    Type:   Image     Comment:   External Document

## 2010-03-24 NOTE — Assessment & Plan Note (Signed)
Summary: Social Work  60 minutes.  Referred by physician and Jamison Neighbor. First visit with social work and established relationship.  This is a morbidly obese patient using a cane to walk.  She is slow and unsteady of gait.    The patient comes in today with a headache, report of blurred vision, and difficulties breathing.  She is 35 minutes late for her doctor appmt and comes to see SW in the meantime until clinic can fit her in today.  She used her inhalor during the visit with me today and found some relief to her labored breathing.   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  83 and 37 year old boys.  She owns her own home and it is fully paid for.  Income is $1,300 coming in for the two children.  The patient is not on disability but has [place] [employer] [place] [employer].  The patient does not complain of financial issues or food issues.  She gets foodstamps of around $163 per month.   She does report lots of maintenance issues with her home and repair projects going on.   The patient discloses alot of stress from taking care of the children and getting them ready to start school again.  She reports taking a recent trip to [place] which took alot out of her.  She would like to start taking care of herself but said she cannot find the time to do so or to even eat three meals and snack every day.   She does say that things have gone downhill since her May hospitalization for hip and knee problems.  She had HH and rehab after the hospitalization but that obviously has not been enough to improve her health situation.   She is not sure where she should begin.  She is not able to use the treadmill or do aerobics in her pool with the weather changing.   She has difficulty getting to appointments since some days she doesn't feel well enough to get up and go.   A/P  I gave Ms. Terrien the schedule for the extreme makeover classes which meet once a month.  I told her to begin  there if she wants to begin taking control of her health.  She seemed receptive to attending the October session but was unable to attend this Tuesday's session. I will let Jamison Neighbor know.  The patient has my card for any questions or concerns.  In my opinion this would be the easiest and simplest place to start with this patient and we can build from this point.     The remainder of the time spent was talking with Triage about getting her in this PM since the physician was unable to see her this AM due to her late arrival.  Myriam Jacobson at first did not have any appmts. The patient left with instructions to call Triage for possible cancellation appmt this PM.  I spoke with Myriam Jacobson later on who told me there is a 4:00 PM and Myriam Jacobson will try and call the patient to let her know.  Glenda did offer to take the patient to the ER but the patient declined and will try for appmt this PM.   SW as needed to assist patient in making. progress.

## 2010-03-24 NOTE — Miscellaneous (Signed)
Summary: [facility]   Imported By: Margie Billet 07/24/2009 14:36:36  _____________________________________________________________________  External Attachment:    Type:   Image     Comment:   External Document  Appended Document: [facility] goals not met, pt being discharged due to not returning

## 2010-03-24 NOTE — Progress Notes (Signed)
Summary: Soc. Work  Programme researcher, broadcasting/film/video of Call: Left message for patient to call me.  She has an appmt with Dr. Onalee Hua on Aug 31st at 11 AM.  That may be good time to meet.    8/30  she rescheduled her appmt for 8/31 at 11:30 with Dr. Claudette Laws.  The appmt was cancelled.  Sending letter to her home to call SW.  Next appmt is in October.

## 2010-03-24 NOTE — Progress Notes (Signed)
Summary: refill/ hla  Phone Note Refill Request Message from:  Fax from Pharmacy on June 19, 2009 11:35 AM  Refills Requested: Medication #1:  PRAVACHOL 40 MG  TABS Take 1 tablet by mouth at bedtime   Dosage confirmed as above?Dosage Confirmed   Last Refilled: 4/4 Initial call taken by: Marin Roberts RN,  June 19, 2009 11:35 AM  Follow-up for Phone Call        Rx faxed to pharmacy Follow-up by: Mariea Stable MD,  Jun 23, 2009 6:35 AM    Prescriptions: PRAVACHOL 40 MG  TABS (PRAVASTATIN SODIUM) Take 1 tablet by mouth at bedtime  #30 Tablet x 10   Entered and Authorized by:   Mariea Stable MD   Signed by:   Mariea Stable MD on 06/23/2009   Method used:   Electronically to        [facility] (retail)       [address]       [place], [place]  [address]       Ph: [phone]       Fax: [phone]   RxID:   [id number]

## 2010-03-24 NOTE — Miscellaneous (Signed)
Summary: Hospital Admission  INTERNAL MEDICINE ADMISSION HISTORY AND PHYSICAL  1st contact: Dr. Tobie Lords [phone] 2nd contact: Dr. Sherryll Burger [phone]  Nights/weekends: 1st contact: [phone] 2nd contact: [phone]  PCP: Dr. Onalee Hua  CC:  Hip/knee pain  HPI:  Pt is a 35 yo female w/ past med hx of mobid obesity, diabetes, and OSA who presented to the ER after she fell around 4:30 am.  She notes recently she has been getting up in her sleep and apparently she was asleep in the standing position leaning over her bed and ended up doing the splits, twisting to the right, and falling landing on her knees.  She also notes hitting her R hip and has had signifcant knee pain, low back pain, and R hip pain.  She is able to move her extremities and no bowel/bladder retention symptoms.  She was stuck on the floor and is no longer to ambulate b/c the pain is so bad.   ALLERGIES: PCN ! DOXYCYCLINE ! * TOMATO  PAST MEDICAL HISTORY: Low back pain-Neg LS Xray 03/08/2003, MRI l spine neg 01/10 GERD Hyperlipidemia Obstructive sleep apnea-CPAP at bedtime Carpal tunnel syndrome-B/L hand numbness; + nerve conduction study Type II diabetes mellitus-dx 12/2000, intolerant to metformin-last a1c 10.6 Genital herpes Shoulder pain-Right, neg xray 11/2002 Depression Asthma Medical Non-compliance Obesity   MEDICATIONS:     ** = not taking ALBUTEROL 90 MCG/ACT  AERS (ALBUTEROL) Take 1-2 puffs every 4 hours as needed for shortness of breath, wheezing or cough ZOVIRAX 200 MG CAPS (ACYCLOVIR) Take 1 capsule by mouth once a day **PRAVACHOL 40 MG  TABS (PRAVASTATIN SODIUM) Take 1 tablet by mouth at bedtime FLUOXETINE HCL 20 MG TABS (FLUOXETINE HCL) Take 3 tablets by mouth once a day **FAMOTIDINE 40 MG TABS (FAMOTIDINE) Take 1 tablet by mouth once a day **SKELAXIN 800 MG  TABS (METAXALONE) Take 1 tablet by mouth three times a day as needed BL LANCETS SUPER THIN   MISC (LANCETS) Use as directed. ADVAIR DISKUS 500-50  MCG/DOSE MISC (FLUTICASONE-SALMETEROL) 1 puff two times a day LANTUS 100 UNIT/ML  SOLN (INSULIN GLARGINE) Inject 50 units into the skin of the abdomen at bedtime BD INSULIN SYRINGE ULTRAFINE 31G X 5/16" 1 ML  MISC (INSULIN SYRINGE-NEEDLE U-100) Use as directed for insulin injections GLIPIZIDE 5 MG  TABS (GLIPIZIDE) Take 1 tablet by mouth two times a day PRODIGY BLOOD GLUCOSE MONITOR  DEVI (BLOOD GLUCOSE MONITORING SUPPL) Use to check blood sugar two times a day **BENEFIBER  POWD (WHEAT DEXTRIN) Add 1 scoop to beverage of choice per day **FLUCONAZOLE 150 MG TABS (FLUCONAZOLE) Take 1 tablet by mouth once a day **LACTULOSE 10 GM/15ML SOLN (LACTULOSE) take 15ml by mouth once or twice daily as needed for constipation PRODIGY BLOOD GLUCOSE TEST  STRP (GLUCOSE BLOOD) use to check blood sugar two times a day TRAMADOL HCL 50 MG TABS (TRAMADOL HCL) take 1-2 tablets every 8 hours as needed for pain **FLECTOR 1.3 % PTCH (DICLOFENAC EPOLAMINE) Apply 1 patch twice a day in the affected joint. erythromycin 250 mg qid for dental infection   SOCIAL HISTORY: Single, but lives with a partner Has 2 children and cares for an infant during the day nonsmoker, no alcohol, no illegal drugs, kids on disability.  FAMILY HISTORY:  Mom died after being trach'd.  Father died of lung ca in his 37's  ROS:  As per HPI.  VITALS: T: 97.5 P: 94  BP: 148/64 R:22  O2SAT:  99 %ON: 2L  PHYSICAL EXAM: General:  alert, oriented, somnolent throughout exam, mobidly obese. Head:  normocephalic and atraumatic.   Eyes:  vision grossly intact, pupils equal, pupils round, pupils reactive to light, no injection and anicteric.   Mouth:  pharynx pink and moist, no erythema, and no exudates.   Neck:  supple, full ROM, no thyromegaly, no JVD, and no carotid bruits.   Lungs:  normal respiratory effort, decreased air mvt globally, no wheezing, no crackles. Heart:  normal rate, regular rhythm, SEM II/VI , no gallop, and no rub.   Abdomen:   Obese, soft, non-tender, normal bowel sounds, no distention, no guarding, no rebound tenderness, no hepatomegaly, and no splenomegaly.   Msk:  Hip, L spine, bilateral knees markedly TTP.  Strength intact in her lower extremities, no echymosis.  Unable to lay supine due to pain.  Pulses:  2+ DP/PT pulses bilaterally Extremities:  No edema Neurologic:  alert & oriented X3, cranial nerves II-XII intact, no saddle anesthesia. Skin:  turgor normal and no rashes.    LABS: Pending  ASSESSMENT AND PLAN:  Pt is a 35 yo female w/: 1. 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  Will f/u w/ CT scan to confirm dx and hold on mobilization/PT until further details of the anatomy are available.  B/c of her morbid obesity, OSA, and high risk for resp compromise w/ narcotics, will limit narcotics as much as possible for pain.  Will start w/ NSAID's, tylenol, and ultram for now.  May need ortho consult if frx is present. 2.  Falls: Unclear why she fell in her sleep.  Will limit sedating meds.  F/u UDS. 3. Dental infection: Will cont outpt erythro prescription.  4. OSA: Will cont CPAP at bedtime. 5. DM-last a1c 10.6.  Cont lantus + SSI. 6. GERD: PPI. 7. HLD: Will hold statin in the setting of msk injury. 8. VTE PROPH: heparin  ATTENDING: I performed and/or observed a history and physical examination of the patient.  I discussed the case with the residents as noted and reviewed the residents' notes.  I agree with the findings and plan--please refer to the attending physician note for more details.  Signature________________________________  Printed Name_____________________________

## 2010-03-24 NOTE — Progress Notes (Signed)
Summary: referral to social work  ---- Converted from [employer] ---- ---- 09/26/2009 10:19 AM, Mariea Stable MD wrote: I would agree with the referral and would greatly appreciate it if you did put it in.  Thanks, Manny  ---- 09/25/2009 4:59 PM, Jamison Neighbor RD,CDE wrote: recommend referral to Social work. I would be happyto do that if you'd like.  thanks- Scherrie Gerlach ------------------------------  Phone Note Outgoing Call   Call placed by: Jamison Neighbor RD,CDE,  September 26, 2009 1:58 PM Summary of Call: social work referral placed for depression and diabetes self care.

## 2010-03-24 NOTE — Progress Notes (Signed)
Summary: med refill/gp  Phone Note Refill Request Message from:  Fax from Pharmacy on March 25, 2009 3:24 PM  Refills Requested: Medication #1:  FAMOTIDINE 40 MG TABS Take 1 tablet by mouth once a day   Last Refilled: 03/06/2009 last appt. 12/16/08.   Method Requested: Electronic Initial call taken by: Chinita Pester RN,  March 25, 2009 3:24 PM  Follow-up for Phone Call        Rx faxed to pharmacy Follow-up by: Mariea Stable MD,  March 26, 2009 6:39 AM    Prescriptions: FAMOTIDINE 40 MG TABS (FAMOTIDINE) Take 1 tablet by mouth once a day  #30 x 11   Entered and Authorized by:   Mariea Stable MD   Signed by:   Mariea Stable MD on 03/26/2009   Method used:   Electronically to        [facility] (retail)       [address]       [place], [place]  [address]       Ph: [phone]       Fax: [phone]   RxID:   [id number]

## 2010-03-24 NOTE — Assessment & Plan Note (Signed)
Summary: R hip pain, diab f/u/ pcp-Bodey Frizell/hla   Vital Signs:  Patient profile:   35 year old female Height:      62 inches Weight:      324.01 pounds BMI:     59.48 Temp:     97.8 degrees F oral Pulse rate:   101 / minute BP sitting:   125 / 68  (left arm)  Vitals Entered By: Angelina Ok RN (September 03, 2009 10:19 AM) CC: Depression Is Patient Diabetic? Yes Did you bring your meter with you today? No Pain Assessment Patient in pain? yes     Location: back, knees Intensity: 6/8 Type: aching Onset of pain  Constant CBG Result 266  Have you ever been in a relationship where you felt threatened, hurt or afraid?No   Does patient need assistance? Functional Status Self care Ambulation Normal Comments Check up.  Refills   CC:  Depression.  History of Present Illness: Ana Long is a 35 yo woman with PMH as outlined below.  She is here for follow up of her diabetes.  She has increased her lantus per instructions.  She is currently up to 92 units of lantus with 160-180 for her fasting CBG.  No hypoglycemic symptoms.  She reports having run out of insulin due to higher dose and just got it this morning.  She does not bring her meter in today either.  Depression History:      The patient is having a depressed mood most of the day and has a diminished interest in her usual daily activities.        The patient denies that she feels like life is not worth living, denies that she wishes that she were dead, and denies that she has thought about ending her life.         Current Medications (verified): 1)  Albuterol 90 Mcg/act  Aers (Albuterol) .... Take 1-2 Puffs Every 4 Hours As Needed For Shortness of Breath, Wheezing or Cough 2)  Zovirax 200 Mg Caps (Acyclovir) .... Take 1 Capsule By Mouth Once A Day 3)  Pravachol 40 Mg  Tabs (Pravastatin Sodium) .... Take 1 Tablet By Mouth At Bedtime 4)  Famotidine 40 Mg Tabs (Famotidine) .... Take 1 Tablet By Mouth Once A Day 5)  Skelaxin 800  Mg  Tabs (Metaxalone) .... Take 1 Tablet By Mouth Three Times A Day As Needed 6)  Prodigy Lancets 28g  Misc (Lancets) .... Use As Directed To Check Sugar 7)  Advair Diskus 500-50 Mcg/dose Misc (Fluticasone-Salmeterol) .Marland Kitchen.. 1 Puff Two Times A Day 8)  Lantus 100 Unit/ml  Soln (Insulin Glargine) .... Inject 92 Units Into The Skin of The Abdomen At Bedtime 9)  Bd Insulin Syringe Ultrafine 31g X 5/16" 1 Ml  Misc (Insulin Syringe-Needle U-100) .... Use As Directed For Insulin Injections 10)  Glipizide 5 Mg  Tabs (Glipizide) .... Take 1 Tablet By Mouth Two Times A Day 11)  Prodigy Blood Glucose Monitor  Devi (Blood Glucose Monitoring Suppl) .... Use To Check Blood Sugar Two Times A Day 12)  Benefiber  Powd (Wheat Dextrin) .... Add 1 Scoop To Beverage of Choice Per Day 13)  Lactulose 10 Gm/36ml Soln (Lactulose) .... Take 15ml By Mouth Once or Twice Daily As Needed For Constipation 14)  Prodigy Blood Glucose Test  Strp (Glucose Blood) .... Use To Check Blood Sugar Two Times A Day 15)  Tramadol Hcl 50 Mg Tabs (Tramadol Hcl) .... Take 1-2 Tablets Every 8 Hours As Needed  For Pain 16)  Flector 1.3 % Ptch (Diclofenac Epolamine) .... Apply 1 Patch Twice A Day in The Affected Joint. 17)  Ibuprofen 200 Mg Tabs (Ibuprofen) .... Takes 800mg  Three Times A Day As Needed.  Allergies: 1)  ! Pcn 2)  ! Doxycycline 3)  ! * Tomato   Impression & Recommendations:  Problem # 1:  DIABETES MELLITUS, TYPE II (ICD-250.00) Currently up to 92 units lantus with fasting CBGs 160-180 Did not bring meter today Will remain at current dose for right now. will refer to Jamison Neighbor Pt currently prefers to stay on once daily dosing, however, she will likely need splitting of lantus and meal coverage.  Her updated medication list for this problem includes:    Lantus 100 Unit/ml Soln (Insulin glargine) ..... Inject 92 units into the skin of the abdomen at bedtime    Glipizide 5 Mg Tabs (Glipizide) .Marland Kitchen... Take 1 tablet by mouth two  times a day  Orders: Capillary Blood Glucose/CBG (45409) Diabetic Clinic Referral (Diabetic)  Labs Reviewed: Creat: 0.62 (04/01/2009)    Reviewed HgBA1c results: 11.6 (07/23/2009)  10.6 (04/01/2009)  Complete Medication List: 1)  Albuterol 90 Mcg/act Aers (Albuterol) .... Take 1-2 puffs every 4 hours as needed for shortness of breath, wheezing or cough 2)  Zovirax 200 Mg Caps (Acyclovir) .... Take 1 capsule by mouth once a day 3)  Pravachol 40 Mg Tabs (Pravastatin sodium) .... Take 1 tablet by mouth at bedtime 4)  Famotidine 40 Mg Tabs (Famotidine) .... Take 1 tablet by mouth once a day 5)  Skelaxin 800 Mg Tabs (Metaxalone) .... Take 1 tablet by mouth three times a day as needed 6)  Prodigy Lancets 28g Misc (Lancets) .... Use as directed to check sugar 7)  Advair Diskus 500-50 Mcg/dose Misc (Fluticasone-salmeterol) .Marland Kitchen.. 1 puff two times a day 8)  Lantus 100 Unit/ml Soln (Insulin glargine) .... Inject 92 units into the skin of the abdomen at bedtime 9)  Bd Insulin Syringe Ultrafine 31g X 5/16" 1 Ml Misc (Insulin syringe-needle u-100) .... Use as directed for insulin injections 10)  Glipizide 5 Mg Tabs (Glipizide) .... Take 1 tablet by mouth two times a day 11)  Prodigy Blood Glucose Monitor Devi (Blood glucose monitoring suppl) .... Use to check blood sugar two times a day 12)  Benefiber Powd (Wheat dextrin) .... Add 1 scoop to beverage of choice per day 13)  Lactulose 10 Gm/64ml Soln (Lactulose) .... Take 15ml by mouth once or twice daily as needed for constipation 14)  Prodigy Blood Glucose Test Strp (Glucose blood) .... Use to check blood sugar two times a day 15)  Tramadol Hcl 50 Mg Tabs (Tramadol hcl) .... Take 1-2 tablets every 8 hours as needed for pain 16)  Flector 1.3 % Ptch (Diclofenac epolamine) .... Apply 1 patch twice a day in the affected joint. 17)  Ibuprofen 200 Mg Tabs (Ibuprofen) .... Takes 800mg  three times a day as needed.  Patient Instructions: 1)  Please schedule a  follow-up appointment in 1 month. 2)  Please schedule with Jamison Neighbor ASAP. 3)  Continue current regimen below. 4)  If you have any problems before then, call clinic.  Prescriptions: LANTUS 100 UNIT/ML  SOLN (INSULIN GLARGINE) Inject 92 units into the skin of the abdomen at bedtime  #3 vials x 6   Entered and Authorized by:   Mariea Stable MD   Signed by:   Mariea Stable MD on 09/03/2009   Method used:   Electronically to  [facility] (retail)       [address]       [place], [place]  [address]       Ph: [phone]       Fax: [phone]   RxID:   [id number]    Vital Signs:  Patient profile:   35 year old female Height:      62 inches Weight:      324.01 pounds BMI:     59.48 Temp:     97.8 degrees F oral Pulse rate:   101 / minute BP sitting:   125 / 68  (left arm)  Vitals Entered By: Angelina Ok RN (September 03, 2009 10:19 AM)    Prevention & Chronic Care Immunizations   Influenza vaccine: Fluvax Non-MCR  (12/18/2007)   Influenza vaccine deferral: Deferred  (04/01/2009)    Tetanus booster: Not documented   Td booster deferral: Deferred  (04/01/2009)    Pneumococcal vaccine: Pneumovax  (04/17/2008)   Pneumococcal vaccine deferral: Deferred  (04/01/2009)  Other Screening   Pap smear: NEGATIVE FOR INTRAEPITHELIAL LESIONS OR MALIGNANCY.  (04/11/2008)   Smoking status: quit  (07/23/2009)  Diabetes Mellitus   HgbA1C: 11.6  (07/23/2009)    Eye exam: Not documented   Diabetic eye exam action/deferral: Ophthalmology referral  (11/25/2008)    Foot exam: yes  (11/25/2008)   Foot exam action/deferral: Do today   High risk foot: Not documented   Foot care education: Not documented    Urine microalbumin/creatinine ratio: 7.9  (08/02/2007)  Lipids   Total Cholesterol: 157  (04/01/2009)   Lipid panel action/deferral: Lipid Panel ordered   LDL: 77  (04/01/2009)   LDL Direct: Not documented   HDL: 48   (04/01/2009)   Triglycerides: 161  (04/01/2009)    SGOT (AST): 16  (04/01/2009)   BMP action: Ordered   SGPT (ALT): 23  (04/01/2009)   Alkaline phosphatase: 40  (04/01/2009)   Total bilirubin: 0.4  (04/01/2009)  Self-Management Support :   Personal Goals (by the next clinic visit) :     Personal A1C goal: 8  (04/01/2009)     Personal blood pressure goal: 130/80  (04/01/2009)     Personal LDL goal: 70  (04/01/2009)    Patient will work on the following items until the next clinic visit to reach self-care goals:     Medications and monitoring: take my medicines every day, check my blood sugar, bring all of my medications to every visit, examine my feet every day  (09/03/2009)     Eating: drink diet soda or water instead of juice or soda, eat more vegetables, use fresh or frozen vegetables, eat foods that are low in salt, eat baked foods instead of fried foods, eat fruit for snacks and desserts, limit or avoid alcohol  (09/03/2009)     Activity: take a 30 minute walk every day  (09/03/2009)    Diabetes self-management support: Written self-care plan, Education handout, Pre-printed educational material, Resources for patients handout  (09/03/2009)   Diabetes care plan printed   Diabetes education handout printed   Referred for diabetes self-mgmt training.    Lipid self-management support: Written self-care plan, Education handout, Pre-printed educational material, Resources for patients handout  (09/03/2009)   Lipid self-care plan printed.   Lipid education handout printed      Resource handout printed.

## 2010-03-26 NOTE — Progress Notes (Signed)
Summary: diabetes testing supplies/dmr  Phone Note Outgoing Call   Call placed by: Jamison Neighbor RD,CDE,  February 24, 2010 4:29 PM Summary of Call: needs diabetes testing supply rxs sent to [facility]  Follow-up for Phone Call        Done.  thanks Follow-up by: Mariea Stable MD,  February 25, 2010 11:30 AM    Prescriptions: ACCU-CHEK MULTICLIX LANCETS  MISC (LANCETS) use to check blood sugar up to 2 times daily  #100 x 11   Entered by:   Jamison Neighbor RD,CDE   Authorized by:   Mariea Stable MD   Signed by:   Mariea Stable MD on 02/25/2010   Method used:   Electronically to        [facility] (retail)       [address]       [place], [place]  [address]       Ph: [phone]       Fax: [phone]   RxID:   [id number] ACCU-CHEK AVIVA PLUS  STRP (GLUCOSE BLOOD) use to check blood sugar up to 2 times daily  #100 x 11   Entered by:   Jamison Neighbor RD,CDE   Authorized by:   Mariea Stable MD   Signed by:   Mariea Stable MD on 02/25/2010   Method used:   Electronically to        [facility] (retail)       [address]       [place], [place]  [address]       Ph: [phone]       Fax: [phone]   RxID:   [id number]   Appended Document: diabetes testing supplies/dmr Pharmacy called and states pt insurance,  medicaid does not pay for aviva plus test strips.  I changed to  plain aviva strips.

## 2010-03-26 NOTE — Progress Notes (Signed)
Summary: Elevated sugars  Phone Note Call from Patient   Caller: Patient Call For: Ana Long Stable MD Summary of Call: Calll from pt said that she is having problems with elevated sugars.  Said that she having some problems with her vision.  Unable to check her sugars.  Pt refused the advise to go to the ER.  Pt was given an appointment to come into the Clinics on tomorrow.Angelina Ok RN  February 10, 2010 3:50 PM  Initial call taken by: Angelina Ok RN,  February 10, 2010 3:50 PM  Follow-up for Phone Call        agree with plan. Follow-up by: Julaine Fusi  DO,  February 10, 2010 4:26 PM     Appended Document: Elevated sugars pt calls today and states she got back in town too late for appt, continues to have problems, she is asked to please got to ED or urg care asap and f/u appt for next week is given

## 2010-03-26 NOTE — Progress Notes (Signed)
Summary: Mammo information  Phone Note Outgoing Call   Summary of Call: additional Mammogram information located in [place]. 09-19-08 MM Digital Diagnostic Bilateral / Dr Consuello Bossier [facility]  March 12, 2010 4:28 PM

## 2010-03-26 NOTE — Letter (Signed)
Summary: CMN-ADVANCED  CMN-ADVANCED   Imported By: Shon Hough 02/05/2010 15:15:09  _____________________________________________________________________  External Attachment:    Type:   Image     Comment:   External Document

## 2010-03-26 NOTE — Assessment & Plan Note (Signed)
Summary: DM TEACHING/CH   Allergies: 1)  ! Pcn 2)  ! Doxycycline 3)  ! * Tomato   Complete Medication List: 1)  Albuterol 90 Mcg/act Aers (Albuterol) .... Take 1-2 puffs every 4 hours as needed for shortness of breath, wheezing or cough 2)  Zovirax 200 Mg Caps (Acyclovir) .... Take 1 capsule by mouth once a day 3)  Pravachol 40 Mg Tabs (Pravastatin sodium) .... Take 1 tablet by mouth at bedtime 4)  Famotidine 40 Mg Tabs (Famotidine) .... Take 1 tablet by mouth once a day 5)  Skelaxin 800 Mg Tabs (Metaxalone) .... Take 1 tablet by mouth three times a day as needed 6)  Advair Diskus 500-50 Mcg/dose Misc (Fluticasone-salmeterol) .Marland Kitchen.. 1 puff two times a day 7)  Lantus 100 Unit/ml Soln (Insulin glargine) .... Inject 100 units into the skin of the abdomen at bedtime 8)  Bd Insulin Syringe Ultrafine 31g X 5/16" 1 Ml Misc (Insulin syringe-needle u-100) .... Use as directed for insulin injections 9)  Glipizide 5 Mg Tabs (Glipizide) .... Take 1 tablet by mouth two times a day 10)  Benefiber Powd (Wheat dextrin) .... Add 1 scoop to beverage of choice per day 11)  Lactulose 10 Gm/62ml Soln (Lactulose) .... Take 15ml by mouth once or twice daily as needed for constipation 12)  Tramadol Hcl 50 Mg Tabs (Tramadol hcl) .... Take 1-2 tablets every 8 hours as needed for pain 13)  Flector 1.3 % Ptch (Diclofenac epolamine) .... Apply 1 patch twice a day in the affected joint. 14)  Ibuprofen 200 Mg Tabs (Ibuprofen) .... Takes 800mg  three times a day as needed. 15)  Accu-chek Aviva Plus Strp (Glucose blood) .... Use to check blood sugar up to 2 times daily 16)  Accu-chek Multiclix Lancets Misc (Lancets) .... Use to check blood sugar up to 2 times daily  Other Orders: DSMT (Medicaid) 60 Minutes [phone])   Orders Added: 1)  DSMT (Medicaid) 60 Minutes [98119]    Diabetes Self Management Training  PCP: Mariea Stable MD Diabetes Type: Type 2 insulin Current smoking Status: quit  Assessment Daily  activities: busy with doctor, lawyer appointmenttments and children Sources of Support: sister calorie counts and eats healthy Knowledge Deficits: has attended diabetes classes in past Special needs or Barriers: sleep apnea prevents her from driving some days  Potential Barriers  Transportation  Economic/Supplies  Child care needs  Coping Skills  Diabetes Medications:  Comments: patient reports skipping glipizide at times, inject all 100 units lantus in abdomen- discussed injecting in smaller maounts- 50 units in each injection and trying arms to see if she gets better absorption/blood sugars  Long Acting  Insulin Type:Lantus  Bedtime Dose: 100 units    Monitoring Self monitoring blood glucose 2 times a day Name of Meter  Accucheck Aviva  Time of Testing  Before Breakfast  After Dinner     Estimated /Usual Carb Intake Breakfast # of Carbs/Grams 2 bowls cereral with skim or 1% milk when she eats breakfast- usually skips Lunch # of Carbs/Grams usually skips  Dinner # of Carbs/Grams doesn;t think about eating until after 630 Pm- fast food often  Nutrition assessment Weight change: rcently lost 12 pounds by eating more frequently throughout the day Do you read food labels?  we did';t have time to discuss today but seems like she does and can, but not sure how to use the information and apply to herself to acheive her goal of weight loss Biggest challenge to eating healthy: Skipping meals Diabetes Disease Process  Discussed today  Medications State name-action-dose-duration-side effects-and time to take medication: Needs review/assistance    Nutritional Management Verbalize importance of controlling food portions: Needs review/assistance   State importance of spacing and not omitting meals and snacks: Needs review/assistance   State changes planned for home meals/snacks: Needs review/assistance     Monitoring Perform glucose monitoring/ketone testing and record results correctly: Demonstrates competency     Lifestyle changes:Goal setting and Problem solving State benefits of making appropriate lifestyle changes: Needs review/assistanceIdentify lifestyle behaviors that need to change: Needs review/assistanceIdentify risk factors that interfere with health: Needs review/assistanceDevelop strategies to reduce risk factors: Needs review/assistanceVerbalize need for and frequency of health care follow-up: Needs review/assistance Psychosocial Adjustment Identify two methods to cope with these feelings: Coping SkillsDiabetes Management Education Done: 03/03/2010    BEHAVIORAL GOALS INITIAL Incorporating appropriate nutritional management: look at fast food meals in book you usally eat and how many calories they contain Monitoring blood glucose levels daily:  2-3 day blood sugar profile prior to next visit        today patient was in a hurry so kept visit to providing new medicaid meter- accuacheck, and discussing basics of weight loss nad blood sugar control.  Diabetes Self Management Support: CDE, office visits, family Follow-up:2-4 weeks- recommended nutrition classes and 1:1 for individual information re lable reading and her calorie needs

## 2010-03-26 NOTE — Assessment & Plan Note (Signed)
Summary: F/U,[facility]   Vital Signs:  Patient profile:   35 year old female Weight:      314 pounds BP sitting:   121 / 75  Vitals Entered By: Lillia Pauls CMA (February 03, 2010 3:59 PM)  History of Present Illness: morbidly obese F presents with bilateral knee arthritis flareups.  Continued pain, with notable and significant degenerative joint disease seen on x-rays on prior films.    Last OV: Mostly better, about 85-90% better with the R knee. Still with some aching, occ joint line pain and minimal swelling. L side knee mildly bothering her as well after recent trip with a fair amount of walking in [place]  Lost 12 pounds  PRIOR OV: 35 yo F with morbid obestity, BMI = 60,  here for bilateral Knee pain.  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   Had a fall  5.10.11 where she was sleep walking, woke up in a split position, hurting her R hip and dropping to the ground on bilateral  knees.   Today, she is having bilateral knee pain. R knee is the worst pain today,  Left knee is painful but not as bad as R.   - R knee worsening ever since the injury   - taking Tramadol with no relief.  has tried ibuprofen in past as well   Underwent home PT after her hospital stay, 3 X a week.  Occupational therapy also came help use her cain, help her wash clothes etc.   Had Injection to her R knee approx 1.5 years ago with some relief.      Allergies: 1)  ! Pcn 2)  ! Doxycycline 3)  ! * Tomato  Past History:  Past medical, surgical, family and social histories (including risk factors) reviewed, and no changes noted (except as noted below).  Past Medical History: Reviewed history from 03/28/2008 and no changes required. Low back pain-Neg LS Xray 03/08/2003 GERD Hyperlipidemia Obstructive sleep apnea-CPAP at bedtime Carpal tunnel syndrome-B/L hand numbness; + nerve conduction study Type II diabetes mellitus-dx 12/2000, intolerant to metformin Genital herpes Shoulder  pain-Right, neg xray 11/2002 Depression Asthma Medical Non-compliance Obesity  Family History: Reviewed history and no changes required.  Social History: Reviewed history from 08/01/2007 and no changes required. Single, but lives with a partner Has 2 children and cares for an infant during the day Current smoker  Review of Systems       REVIEW OF SYSTEMS  GEN: No systemic complaints, no fevers, chills, sweats, or other acute illnesses MSK: Detailed in the HPI GI: tolerating PO intake without difficulty Neuro: No numbness, parasthesias, or tingling associated. Otherwise the pertinent positives of the ROS are noted above.    Physical Exam  General:  Well-developed,well-nourished,in no acute distress; alert,appropriate and cooperative throughout examination, morbidly obese Msk:  R knee  Mild effusion present. full ext, flexion to 115.  Intact verus, valgus stress testing, ACL, PCL on ant / post drawer. firm end points.     Much improved exam, nt mcmurrays  L knee - full rom in flexion and extenion.  no effusion  / echymosis.  crepitus is felt with flexion.  Non-tender to palpation of jointline, patella, tibial tuberosity, popliteal fossa.     Impression & Recommendations:  Problem # 1:  OSTEOARTHRITIS, KNEE, RIGHT (ICD-715.96) Assessment Deteriorated  Knee Injection, RIGHT Patient verbally consented to procedure. Risks, benefits, and alternatives explained. Sterilely prepped with betadine. Ethyl cholride used for anesthesia. 9 cc Lidocaine  1% mixed with 1 cc of Kenalog 40 mg injected using the anterolateral approach without difficulty. No complications with procedure and tolerated well. Patient had decreased pain post-injection.   Her updated medication list for this problem includes:    Skelaxin 800 Mg Tabs (Metaxalone) .Marland Kitchen... Take 1 tablet by mouth three times a day as needed    Tramadol Hcl 50 Mg Tabs (Tramadol hcl) .Marland Kitchen... Take 1-2 tablets every 8 hours as needed for  pain    Ibuprofen 200 Mg Tabs (Ibuprofen) .Marland Kitchen... Takes 800mg  three times a day as needed.  Orders: Joint Aspirate / Injection, Large (20610) Kenalog 10mg  (4units) (J3301)  Problem # 2:  OSTEOARTHRITIS, KNEE, LEFT (ICD-715.96) Assessment: Deteriorated  Knee Injection, LEFT Patient verbally consented to procedure. Risks, benefits, and alternatives explained. Sterilely prepped with betadine. Ethyl cholride used for anesthesia. 9 cc Lidocaine 1% mixed with 1 cc of Kenalog 40 mg injected using the anterolateral approach without difficulty. No complications with procedure and tolerated well. Patient had decreased pain post-injection.   Her updated medication list for this problem includes:    Skelaxin 800 Mg Tabs (Metaxalone) .Marland Kitchen... Take 1 tablet by mouth three times a day as needed    Tramadol Hcl 50 Mg Tabs (Tramadol hcl) .Marland Kitchen... Take 1-2 tablets every 8 hours as needed for pain    Ibuprofen 200 Mg Tabs (Ibuprofen) .Marland Kitchen... Takes 800mg  three times a day as needed.  Orders: Joint Aspirate / Injection, Large (20610) Kenalog 10mg  (4units) (J3301)  Complete Medication List: 1)  Albuterol 90 Mcg/act Aers (Albuterol) .... Take 1-2 puffs every 4 hours as needed for shortness of breath, wheezing or cough 2)  Zovirax 200 Mg Caps (Acyclovir) .... Take 1 capsule by mouth once a day 3)  Pravachol 40 Mg Tabs (Pravastatin sodium) .... Take 1 tablet by mouth at bedtime 4)  Famotidine 40 Mg Tabs (Famotidine) .... Take 1 tablet by mouth once a day 5)  Skelaxin 800 Mg Tabs (Metaxalone) .... Take 1 tablet by mouth three times a day as needed 6)  Prodigy Lancets 28g Misc (Lancets) .... Use as directed to check sugar 7)  Advair Diskus 500-50 Mcg/dose Misc (Fluticasone-salmeterol) .Marland Kitchen.. 1 puff two times a day 8)  Lantus 100 Unit/ml Soln (Insulin glargine) .... Inject 92 units into the skin of the abdomen at bedtime 9)  Bd Insulin Syringe Ultrafine 31g X 5/16" 1 Ml Misc (Insulin syringe-needle u-100) .... Use as  directed for insulin injections 10)  Glipizide 5 Mg Tabs (Glipizide) .... Take 1 tablet by mouth two times a day 11)  Prodigy Blood Glucose Monitor Devi (Blood glucose monitoring suppl) .... Use to check blood sugar two times a day 12)  Benefiber Powd (Wheat dextrin) .... Add 1 scoop to beverage of choice per day 13)  Lactulose 10 Gm/60ml Soln (Lactulose) .... Take 15ml by mouth once or twice daily as needed for constipation 14)  Prodigy Blood Glucose Test Strp (Glucose blood) .... Use to check blood sugar two times a day 15)  Tramadol Hcl 50 Mg Tabs (Tramadol hcl) .... Take 1-2 tablets every 8 hours as needed for pain 16)  Flector 1.3 % Ptch (Diclofenac epolamine) .... Apply 1 patch twice a day in the affected joint. 17)  Ibuprofen 200 Mg Tabs (Ibuprofen) .... Takes 800mg  three times a day as needed. 18)  Fluconazole 150 Mg Tabs (Fluconazole) .... Take 1 tablet by mouth once a day 19)  Bactrim Ds 800-160 Mg Tabs (Sulfamethoxazole-trimethoprim) .... Take one (1) by mouth twice a  day for 3 days   Orders Added: 1)  Est. Patient Level III [60454] 2)  Joint Aspirate / Injection, Large [20610] 3)  Joint Aspirate / Injection, Large [20610] 4)  Kenalog 10mg  (4units) [J3301]

## 2010-03-26 NOTE — Consult Note (Signed)
Summary[facility]   Imported By: Margie Billet 03/03/2010 14:45:00  _____________________________________________________________________  External Attachment:    Type:   Image     Comment:   External Document
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
## 2010-03-26 NOTE — Op Note (Signed)
Summary: Consent  Consent   Imported By: Marily Memos 02/04/2010 14:11:54  _____________________________________________________________________  External Attachment:    Type:   Image     Comment:   External Document

## 2010-03-27 NOTE — Miscellaneous (Signed)
Summary: Certificate of Medical Necessity  Certificate of Medical Necessity   Imported By: Shon Hough 09/08/2009 15:25:58  _____________________________________________________________________  External Attachment:    Type:   Image     Comment:   External Document

## 2010-04-01 ENCOUNTER — Ambulatory Visit: Payer: Self-pay | Admitting: Internal Medicine

## 2010-04-30 ENCOUNTER — Encounter: Payer: Self-pay | Admitting: Internal Medicine

## 2010-05-04 LAB — GLUCOSE, CAPILLARY: Glucose-Capillary: 296 mg/dL — ABNORMAL HIGH (ref 70–99)

## 2010-05-05 ENCOUNTER — Ambulatory Visit: Payer: Self-pay | Admitting: Family Medicine

## 2010-05-07 LAB — GLUCOSE, CAPILLARY: Glucose-Capillary: 266 mg/dL — ABNORMAL HIGH (ref 70–99)

## 2010-05-07 LAB — POCT I-STAT, CHEM 8
BUN: 5 mg/dL — ABNORMAL LOW (ref 6–23)
Calcium, Ion: 1.19 mmol/L (ref 1.12–1.32)
Hemoglobin: 15.3 g/dL — ABNORMAL HIGH (ref 12.0–15.0)
Sodium: 137 mEq/L (ref 135–145)
TCO2: 29 mmol/L (ref 0–100)

## 2010-05-09 LAB — GLUCOSE, CAPILLARY: Glucose-Capillary: 415 mg/dL — ABNORMAL HIGH (ref 70–99)

## 2010-05-11 LAB — GLUCOSE, CAPILLARY
Glucose-Capillary: 208 mg/dL — ABNORMAL HIGH (ref 70–99)
Glucose-Capillary: 239 mg/dL — ABNORMAL HIGH (ref 70–99)
Glucose-Capillary: 245 mg/dL — ABNORMAL HIGH (ref 70–99)
Glucose-Capillary: 295 mg/dL — ABNORMAL HIGH (ref 70–99)
Glucose-Capillary: 305 mg/dL — ABNORMAL HIGH (ref 70–99)
Glucose-Capillary: 315 mg/dL — ABNORMAL HIGH (ref 70–99)
Glucose-Capillary: 323 mg/dL — ABNORMAL HIGH (ref 70–99)

## 2010-05-11 LAB — CBC
HCT: 39.3 % (ref 36.0–46.0)
Hemoglobin: 12.7 g/dL (ref 12.0–15.0)
Platelets: 311 10*3/uL (ref 150–400)
WBC: 9.5 10*3/uL (ref 4.0–10.5)

## 2010-05-11 LAB — BASIC METABOLIC PANEL
Calcium: 9.3 mg/dL (ref 8.4–10.5)
GFR calc non Af Amer: >60 mL/min (ref >60)
Potassium: 4 mEq/L (ref 3.5–5.1)
Sodium: 132 mEq/L — ABNORMAL LOW (ref 135–145)

## 2010-05-11 LAB — WET PREP, GENITAL: Clue Cells Wet Prep HPF POC: NONE SEEN

## 2010-05-11 LAB — URINALYSIS, ROUTINE W REFLEX MICROSCOPIC
Glucose, UA: 500 mg/dL — AB
Hgb urine dipstick: NEGATIVE
Ketones, ur: NEGATIVE mg/dL
pH: 6 (ref 5.0–8.0)

## 2010-05-11 LAB — URINE CULTURE
Colony Count: NO GROWTH
Culture: NO GROWTH

## 2010-05-11 LAB — POCT PREGNANCY, URINE: Preg Test, Ur: NEGATIVE

## 2010-05-11 LAB — GC/CHLAMYDIA PROBE AMP, GENITAL: GC Probe Amp, Genital: NEGATIVE

## 2010-05-12 LAB — BASIC METABOLIC PANEL
BUN: 8 mg/dL (ref 6–23)
Calcium: 8.2 mg/dL — ABNORMAL LOW (ref 8.4–10.5)
Creatinine, Ser: 0.62 mg/dL (ref 0.4–1.2)
GFR calc non Af Amer: >60 mL/min (ref >60)
Glucose, Bld: 360 mg/dL — ABNORMAL HIGH (ref 70–99)
Potassium: 4 mEq/L (ref 3.5–5.1)

## 2010-05-12 LAB — CBC
HCT: 36.6 % (ref 36.0–46.0)
MCHC: 31.9 g/dL (ref 30.0–36.0)
Platelets: 233 10*3/uL (ref 150–400)
Platelets: 255 10*3/uL (ref 150–400)
RBC: 4.34 MIL/uL (ref 3.87–5.11)
RDW: 15.7 % — ABNORMAL HIGH (ref 11.5–15.5)
WBC: 7.5 10*3/uL (ref 4.0–10.5)
WBC: 8.9 10*3/uL (ref 4.0–10.5)

## 2010-05-12 LAB — BLOOD GAS, ARTERIAL
Acid-Base Excess: 1.8 mmol/L (ref 0.0–2.0)
Drawn by: 321311
O2 Saturation: 93.4 %
TCO2: 28.4 mmol/L (ref 0–100)
pCO2 arterial: 50.2 mmHg — ABNORMAL HIGH (ref 35.0–45.0)

## 2010-05-12 LAB — GLUCOSE, CAPILLARY
Glucose-Capillary: 259 mg/dL — ABNORMAL HIGH (ref 70–99)
Glucose-Capillary: 276 mg/dL — ABNORMAL HIGH (ref 70–99)
Glucose-Capillary: 310 mg/dL — ABNORMAL HIGH (ref 70–99)
Glucose-Capillary: 313 mg/dL — ABNORMAL HIGH (ref 70–99)
Glucose-Capillary: 331 mg/dL — ABNORMAL HIGH (ref 70–99)
Glucose-Capillary: 364 mg/dL — ABNORMAL HIGH (ref 70–99)
Glucose-Capillary: 378 mg/dL — ABNORMAL HIGH (ref 70–99)
Glucose-Capillary: 379 mg/dL — ABNORMAL HIGH (ref 70–99)
Glucose-Capillary: 384 mg/dL — ABNORMAL HIGH (ref 70–99)
Glucose-Capillary: 430 mg/dL — ABNORMAL HIGH (ref 70–99)
Glucose-Capillary: 439 mg/dL — ABNORMAL HIGH (ref 70–99)

## 2010-05-12 LAB — PROTIME-INR
INR: 0.96 (ref 0.00–1.49)
Prothrombin Time: 12.7 seconds (ref 11.6–15.2)

## 2010-05-12 LAB — URINE DRUGS OF ABUSE SCREEN W ALC, ROUTINE (REF LAB)
Amphetamine Screen, Ur: NEGATIVE
Barbiturate Quant, Ur: NEGATIVE
Creatinine,U: 117.5 mg/dL
Marijuana Metabolite: NEGATIVE
Methadone: NEGATIVE
Propoxyphene: NEGATIVE

## 2010-05-12 LAB — HEMOGLOBIN A1C: Hgb A1c MFr Bld: 13 % — ABNORMAL HIGH (ref <5.7)

## 2010-05-12 LAB — APTT: aPTT: 30 seconds (ref 24–37)

## 2010-05-13 LAB — GLUCOSE, CAPILLARY: Glucose-Capillary: 251 mg/dL — ABNORMAL HIGH (ref 70–99)

## 2010-05-18 LAB — URINE MICROSCOPIC-ADD ON
RBC / HPF: NONE SEEN RBC/hpf (ref <3)
WBC, UA: NONE SEEN WBC/hpf (ref <3)

## 2010-05-18 LAB — URINALYSIS, ROUTINE W REFLEX MICROSCOPIC
Bilirubin Urine: NEGATIVE
Nitrite: NEGATIVE
Specific Gravity, Urine: 1.01 (ref 1.005–1.030)
Urobilinogen, UA: 0.2 mg/dL (ref 0.0–1.0)
pH: 5.5 (ref 5.0–8.0)

## 2010-05-19 ENCOUNTER — Ambulatory Visit: Payer: Self-pay | Admitting: Family Medicine

## 2010-05-22 ENCOUNTER — Other Ambulatory Visit: Payer: Self-pay | Admitting: *Deleted

## 2010-05-24 MED ORDER — ACYCLOVIR 200 MG PO CAPS: 200.0000 mg | ORAL_CAPSULE | Freq: Every day | ORAL | Status: DC

## 2010-05-24 MED ORDER — GLIPIZIDE 5 MG PO TABS: 5.0000 mg | ORAL_TABLET | Freq: Two times a day (BID) | ORAL | Status: DC

## 2010-05-24 MED ORDER — FAMOTIDINE 40 MG PO TABS: 40.0000 mg | ORAL_TABLET | Freq: Every day | ORAL | Status: DC

## 2010-06-02 LAB — GLUCOSE, CAPILLARY: Glucose-Capillary: 222 mg/dL — ABNORMAL HIGH (ref 70–99)

## 2010-06-03 ENCOUNTER — Ambulatory Visit: Payer: Self-pay | Admitting: Internal Medicine

## 2010-06-03 ENCOUNTER — Encounter: Payer: Self-pay | Admitting: Internal Medicine

## 2010-06-03 DIAGNOSIS — J309 Allergic rhinitis, unspecified: Secondary | ICD-10-CM

## 2010-06-03 DIAGNOSIS — G4710 Hypersomnia, unspecified: Secondary | ICD-10-CM

## 2010-06-03 DIAGNOSIS — R300 Dysuria: Secondary | ICD-10-CM

## 2010-06-03 DIAGNOSIS — K219 Gastro-esophageal reflux disease without esophagitis: Secondary | ICD-10-CM

## 2010-06-03 DIAGNOSIS — F329 Major depressive disorder, single episode, unspecified: Secondary | ICD-10-CM

## 2010-06-03 MED ORDER — INSULIN GLARGINE 100 UNIT/ML ~~LOC~~ SOLN: 100.0000 [IU] | Freq: Every day | SUBCUTANEOUS | Status: DC

## 2010-06-03 MED ORDER — FAMOTIDINE 40 MG PO TABS: 40.0000 mg | ORAL_TABLET | Freq: Every day | ORAL | Status: DC

## 2010-06-03 MED ORDER — FLUCONAZOLE 100 MG PO TABS: 150.0000 mg | ORAL_TABLET | Freq: Every day | ORAL | Status: AC

## 2010-06-03 MED ORDER — ACCU-CHEK MULTICLIX LANCETS MISC: Status: DC

## 2010-06-03 MED ORDER — "INSULIN SYRINGE-NEEDLE U-100 31G X 5/16"" 1 ML MISC": 1.0000 | Freq: Every day | Status: AC

## 2010-06-03 MED ORDER — GLUCOSE BLOOD VI STRP: ORAL_STRIP | Status: DC

## 2010-06-03 MED ORDER — FLUTICASONE PROPIONATE 50 MCG/ACT NA SUSP: 2.0000 | Freq: Every day | NASAL | Status: DC

## 2010-06-03 MED ORDER — ALBUTEROL 90 MCG/ACT IN AERS: 1.0000 | INHALATION_SPRAY | RESPIRATORY_TRACT | Status: DC | PRN

## 2010-06-03 MED ORDER — PRAVASTATIN SODIUM 40 MG PO TABS: 40.0000 mg | ORAL_TABLET | Freq: Every day | ORAL | Status: DC

## 2010-06-03 NOTE — Patient Instructions (Signed)
SEE IF THE CHILDREN CAN GO TO [facility] ON [address] FOR THEIR ISSUES

## 2010-06-03 NOTE — Progress Notes (Signed)
Subjective:    Patient ID: Ana Long, female    DOB: October 24, 1975, 35 y.o.   MRN: [id number]  HPI  Pleasant 35 year old with morbid obesity (62 in, 309 lb) and associated complications of Sleep Apnea, DM-2, and numerous arthralgias She has a long history of providing foster care, and currently has 2 boys and a girl at home-has an associated high stress level--traveling back and forth to [facility] for counseling at their behavioral center  Her diet is very poor by any standard-sodas, snacks, and irregularity in her meal pattern Her A1C is 10.6 today, and she has been out of several of her medications over the last few weeks. Is here today with focal concern of dysuria, and cough associated with nasal congestion, although we also spent time trying to get her focused back on her diabetes. Notes significant nasal congestion with cough, no wheezing. Is very poorly conditioned and gets out of breath with mild to moderate activity (walking at a slow pace) as would be expected since her BMI is approaching  3X normal.No polyuria.  Has had a three day history of urinary urgency-hesitancy-no hematuria, fevers, flank pain N/V   Review of Systems As above     Objective:   Physical Exam  Constitutional: She is oriented to person, place, and time.       Morbidly obese woman, walks with some difficulty as a result of body habitus, NAD, cheerful and alert  HENT:  Head: Normocephalic and atraumatic.       Nasal mucosa boggy and pale She is congested, coughing during clinical exam  Eyes: EOM are normal. Pupils are equal, round, and reactive to light. No scleral icterus.  Neck: Normal range of motion. Neck supple.       Unable to asssess for TM secondary to neck obesity  Cardiovascular: Normal rate, regular rhythm and normal heart sounds.   Pulmonary/Chest: Effort normal and breath sounds normal. No respiratory distress. She has no wheezes. She has no rales. She exhibits no tenderness.       Good  excursion of chest  Musculoskeletal: Normal range of motion. She exhibits no edema and no tenderness.  Lymphadenopathy:    She has no cervical adenopathy.  Neurological: She is alert and oriented to person, place, and time. She has normal reflexes. She displays normal reflexes. No cranial nerve deficit. She exhibits normal muscle tone. Coordination normal.  Skin: Skin is warm and dry. No rash noted.  Psychiatric: She has a normal mood and affect. Her behavior is normal. Judgment and thought content normal.          Assessment & Plan:   1. DM-2 in poor control     Given her diet, and recent gaps in medication would as a first step get her into DM counseling with [facility], get back on her meds, and at least try to eliminate sodas and any other sugared drinks for now     We will get that underway, see her back in 4 weeks with me as a next step. 2. Allergic Rhinitis:     Flonase 2 sprays qd each nares, discontinue pseudoephedrine tabs which she is taking, resume regular antihistamines     Advised that the "Flonase does not work like pseudofed-it takes time and you need to use it every day or it will not work" 3. OSA      Needs to optimize her allergic sx's as a first step 4. Morbid Obesity      See #  1      Not a candidate for surgery at this stage

## 2010-06-04 LAB — URINE CULTURE: Colony Count: 80000

## 2010-06-08 LAB — GLUCOSE, CAPILLARY: Glucose-Capillary: 190 mg/dL — ABNORMAL HIGH (ref 70–99)

## 2010-06-10 NOTE — Progress Notes (Signed)
Pt was called and informed of negative urine cx. Result per Dr. Reche Dixon.

## 2010-06-15 ENCOUNTER — Ambulatory Visit (INDEPENDENT_AMBULATORY_CARE_PROVIDER_SITE_OTHER): Payer: Self-pay | Admitting: Physician Assistant

## 2010-06-15 ENCOUNTER — Other Ambulatory Visit: Payer: Self-pay | Admitting: Physician Assistant

## 2010-06-15 DIAGNOSIS — N949 Unspecified condition associated with female genital organs and menstrual cycle: Secondary | ICD-10-CM

## 2010-06-15 DIAGNOSIS — R102 Pelvic and perineal pain: Secondary | ICD-10-CM

## 2010-06-16 NOTE — Group Therapy Note (Signed)
Ana Long, Ana Long            ACCOUNT NO.:  [id number]  MEDICAL RECORD NO.:  [id number]           PATIENT TYPE:  A  LOCATION:  [facility]                   FACILITY:  [facility]  PHYSICIAN:  Maylon Cos, CNM    DATE OF BIRTH:  03/03/1975  DATE OF SERVICE:  06/15/2010                                 CLINIC NOTE  Reason for today's visit is pain in the lower right quadrant of the abdomen x2 weeks and difficulty with urination.  HISTORY OF PRESENT ILLNESS:  The patient is a 35 year old nulligravida patient who states that this pain began approximately 2 weeks ago.  She is morbidly obese and has multiple comorbidities.  First day of her last menstrual period was 06/06/2010, states that pain became more pronounced approximately 2 days after her period ended.  She describes the pain as sharp and shooting.  It is more uncomfortable with position changes. She is concerned for having intercourse due to her fear of pain.  She was evaluated by her primary care provider at [facility] on Tuesday for concern for urinary tract infection.  The provider called her at the end of the week and told her that her urine culture was negative and that he did not believe that this was a primary care issue and encouraged her to follow up with her GYN.  She presents here today for further followup.  PHYSICAL EXAMINATION:  GENERAL:  On examination, Ms. Denham is a pleasant African American female who is morbidly obese. HEENT:  Grossly normal. VITAL SIGNS:  Stable.  Blood pressure is 141/84, her weight is 307.1, height is 62 inches. ABDOMEN:  Large and pendulous, obese, nontender to palpation.  No masses. GENITALIA:  She is a tanner V, external genitalia without lesions. Mucous membranes are pink without lesions.  Her cervix is nulligravida, smooth, and pink without lesions, nonfriable.  Bimanual exam is limited by patient habitus.  She does have a mild tenderness on the right side. No  cervical motion tenderness.  There is a scant amount of creamy discharge noted in the vault without odor.  She does have a grade 1 rectocele noted on examination. EXTREMITIES:  Without edema.  Pulses are equal.  ASSESSMENT: 1. Screening for sexually transmitted infection. 2. Right lower quadrant pain.  PLAN: 1. We will send specimens for infection including wet prep and     gonorrhea and Chlamydia today. 2. Pelvic ultrasound will be scheduled today.  FOLLOWUP:  The patient should follow up in 2 weeks after pelvic ultrasound in clinic to discuss results or sooner if problems persist or become worse.          ______________________________ Maylon Cos, CNM    SS/MEDQ  D:  06/15/2010  T:  06/16/2010  Job:  [id number]

## 2010-06-24 ENCOUNTER — Ambulatory Visit (HOSPITAL_COMMUNITY)
Admission: RE | Admit: 2010-06-24 | Discharge: 2010-06-24 | Disposition: A | Discharge status: Home / Self Care | Payer: Medicaid Other | Source: Ambulatory Visit | Attending: Physician Assistant | Admitting: Physician Assistant

## 2010-06-24 DIAGNOSIS — R102 Pelvic and perineal pain: Secondary | ICD-10-CM

## 2010-06-24 DIAGNOSIS — N949 Unspecified condition associated with female genital organs and menstrual cycle: Secondary | ICD-10-CM | POA: Insufficient documentation

## 2010-06-29 ENCOUNTER — Ambulatory Visit: Payer: Self-pay | Admitting: Dietician

## 2010-06-30 ENCOUNTER — Ambulatory Visit (INDEPENDENT_AMBULATORY_CARE_PROVIDER_SITE_OTHER): Payer: Medicaid Other | Admitting: Family Medicine

## 2010-06-30 DIAGNOSIS — M1710 Unilateral primary osteoarthritis, unspecified knee: Secondary | ICD-10-CM

## 2010-06-30 NOTE — Progress Notes (Signed)
35 yo female, morbidly obese with early onset knee OA with acute R knee flare.  Mild swelling, some symptomatic giving way. Pain, more medially. Taking Tylenol, Motrin, Heat prn  REVIEW OF SYSTEMS  GEN: No fevers, chills. Nontoxic. Primarily MSK c/o today. MSK: Detailed in the HPI GI: tolerating PO intake without difficulty Neuro: No numbness, parasthesias, or tingling associated. Otherwise the pertinent positives of the ROS are noted above.   GEN: Well-developed,well-nourished,in no acute distress; alert,appropriate and cooperative throughout examination HEENT: Normocephalic and atraumatic without obvious abnormalities. Ears, externally no deformities PULM: Breathing comfortably in no respiratory distress EXT: No clubbing, cyanosis, or edema PSYCH: Normally interactive. Cooperative during the interview. Pleasant. Friendly and conversant. Not anxious or depressed appearing. Normal, full affect.  Gait: antalgic ROM: WNL on L, R at 0-115 Effusion: mild on R Echymosis or edema: none Patellar tendon NT Painful PLICA: neg Patellar grind: negative Medial and lateral patellar facet loading: negative medial and lateral joint lines: M and L, R Mcmurray's neg Flexion-pinch pos Varus and valgus stress: stable Lachman: neg Ant and Post drawer: neg Hip abduction, IR, ER: WNL Hip flexion str: 4+/5 Hip abd: 5/5 Quad: 5/5 VMO atrophy: mild Hamstring concentric and eccentric: 5/5

## 2010-06-30 NOTE — Assessment & Plan Note (Signed)
R OA flare, cont NSAIDS, Tylenol, Heat prn  Knee Injection Patient verbally consented to procedure. Risks, benefits, and alternatives explained. Sterilely prepped with betadine. Ethyl cholride used for anesthesia. 9 cc Lidocaine 1% mixed with 1 cc of Kenalog 40 mg injected using the anteromedial approach without difficulty. No complications with procedure and tolerated well. Patient had decreased pain post-injection.

## 2010-07-02 ENCOUNTER — Ambulatory Visit: Payer: Medicaid Other | Admitting: Internal Medicine

## 2010-07-02 ENCOUNTER — Ambulatory Visit: Payer: Medicaid Other | Admitting: Physician Assistant

## 2010-07-07 NOTE — Group Therapy Note (Signed)
[name], Ana Long NO.:  [id number]   MEDICAL RECORD NO.:  [id number]          PATIENT TYPE:  WOC   LOCATION:  [facility]                   FACILITY:  [facility]   PHYSICIAN:  Argentina Donovan, MD        DATE OF BIRTH:  16-Feb-1976   DATE OF SERVICE:  07/07/2006                                  CLINIC NOTE   The patient is a 35 year old nulligravida African American female,  weighing 302 pounds with polycystic ovarian syndrome who was been on  Provera withdrawal for several years, in for her routine examination.   The breasts are pendulous, very large, no nipple discharge.  No dominant  masses noted.  ABDOMEN: Is soft, obese, nontender.  External genitalia is normal. Note, the intertrigo the patient had seems  to be well under control. The vagina is clean and well rugated.  Cervix  is clean.  Nulliparous.  Uterus and adnexa could not be palpated because  of the habitus of the patient.   We are placing her back on Zovirax, which she has been taking for 5  years prophylactically. She stopped for 10 days and had an outbreak.  Starting her back on that; it worked very well.  She also on other  medications include Glyburide and Tramal. We have talked to her about  her weight and discuss the possibility of a banding. She is looking into  it and is serious about it. Her family is somewhat resistant.   IMPRESSION:  Impression is normal gynecological examination,  polycystic  ovarian syndrome.           ______________________________  Argentina Donovan, MD     PR/MEDQ  D:  07/07/2006  T:  07/07/2006  Job:  [id number]

## 2010-07-07 NOTE — Group Therapy Note (Signed)
[name], Ana Long NO.:  [id number]   MEDICAL RECORD NO.:  [id number]          PATIENT TYPE:  WOC   [facility]ATION[facility]                   [facility]ACILITY:  [facility]   PHYSICIAN:  Argentina Donovan, MD        DATE OF BIRTH:  Jun 23, 1975   DATE OF SERVICE:  04/11/2008                                  CLINIC NOTE   The patient is a 35 year old nulligravida morbidly obese African  American female with polycystic ovarian syndrome who recently has had  fairly regular periods.  The problem with them is that they have been  causing nausea and severe cramping.  My feeling is th at this is  probably prostaglandin production and I talked to her about using, since  her periods have become more predictable, ibuprofen prior to the onset  of her period to see if that would control it.  She came in also for STD  testing.  Her husband she is divorced from.  She has a new boyfriend who  she is trying to get pregnant with.  She cannot tolerate metformin.  It  gives her terrible cramps.  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  She also takes Zocor, Glyburide and albuterol with  __________ and Prozac.  She seems well in control of any depression she  was having.  The significant thing about her is with her weight of 323  pounds and 5 feet 2 inches tall, she has about a second-degree uterine  prolapse which makes it difficult sometimes for her to have sex.  I have  told her to get one of the supporting pillows for her hips and see if  that would help, seems to appreciate that.  She is also in a bariatric  program to try and lose weight and the last time she was in, which was  in March 2009, she weighed 323.  She is down to 308, so that is a  significant change in her and shows that she I think is very serious  about this.  We will do the STD testing that she requests today.   PHYSICAL EXAMINATION:  Her blood pressure is 122/71, pulse 69.  Her  weight is 308 and she is 5 feet 2  inches tall.  Well-developed, grossly obese female in no acute distress.  Thyroid is symmetrical.  Neck is supple.  BREASTS:  Large pendulous with no dominant masses noted.  No nipple  discharge.  No supraclavicular or axillary nodes palpable.  ABDOMEN:  Is soft, rotund and no organomegaly can be palpated.  The  patient has no significant discomfort.  External genitalia is normal.  BUS within normal limits.  Vagina is  clean and well rugated.  The cervix is clean, nulliparous and comes down  to the middle portion of the vagina.  Pap smear was taken and also  checked for STDs as well as a wet prep being done and the patient will  get the appropriate blood work before discharge.   IMPRESSION:  1. Dysmenorrhea with nausea.  2. Polycystic ovarian syndrome (periods regular for the last year).  3. Morbid  obesity (she is in the bariatric program).  4. Sexually transmitted diseases testing (at the patient's request).           ______________________________  Argentina Donovan, MD    PR/MEDQ  D:  04/11/2008  T:  04/11/2008  Job:  [id number]

## 2010-07-07 NOTE — Discharge Summary (Signed)
NAMEPRICILLA, Ana Long            ACCOUNT NO.:  [id number]   MEDICAL RECORD NO.:  [id number]          PATIENT TYPE:  INP   LOCATION:  5530                         FACILITY:  [facility]   PHYSICIAN:  Eliseo Gum, M.D.   DATE OF BIRTH:  June 15, 1975   DATE OF ADMISSION:  08/10/2007  DATE OF DISCHARGE:  08/14/2007                               DISCHARGE SUMMARY   DISCHARGE DIAGNOSES:  1. Asthma exacerbation likely secondary to upper respiratory tract      infection plus running out of medications.  2. Diabetes mellitus type 2, uncontrolled.  3. Obstructive sleep apnea with hypersomnia.  4. Depression.  5. Genital herpes.  6. Hyperlipidemia.  7. Gastroesophageal reflux disease.  8. Mild microcytic anemia.  9. Chronic low back pain.   DISCHARGE MEDICATIONS:  1. Albuterol inhaler 1-2 puffs q.4 h. p.r.n.  2. Zovirax 200 mg p.o. daily.  3. Pravachol 40 mg p.o. daily.  4. Fluoxetine 40 mg p.o. daily.  5. Famotidine 40 mg p.o. daily.  6. Skelaxin 800 mg p.o. t.i.d. p.r.n.  7. Ultram ER 100 mg p.o. daily p.r.n.  8. Advair 100/50 1 puff b.i.d.  9. Ambien 5 mg p.o. nightly p.r.n.  10.Tussionex 5 mL p.o. q.12 h. p.r.n.  11.Lantus 40 units subcu nightly.  12.Metformin 1000 mg p.o. b.i.d.  13.Prednisone taper with 10 mg tablets, 5 tablets on June 23, 4 on      June 24, 3 on June 25, 2 on June 26, and then 1 on June 27, then      stop.  14.Avelox 400 mg p.o. daily x5 days.   Condition at discharge was stable and improved.  The patient is to see  Dr. Rufina Long in the Outpatient Clinic on Wednesday, August 23, 2007, at  1:50 p.m.  She is to bring her CBG log with her to have her Lantus  insulin adjusted.  The patient was instructed to call the clinic if she  ever had any sugars less than 80 or greater than 200.   PROCEDURE:  Chest x-ray on August 10, 2007 showed stable shallow lung  volumes with infrahilar atelectasis.   CONSULTANTS:  None.   ADMISSION HISTORY AND PHYSICAL:  Please see the  chart for full details.  In summary, [name] is a 35 year old female with a history of  asthma who has never been intubated, diabetes, and morbid obesity, who  ran out of her albuterol 4 months prior to admission.  She presented to  the Outpatient Clinic complaining of dyspnea and cough, productive of  white phlegm, small quantity, and no blood.  The cough was mostly at  night for 2 weeks, but it had been worse in the last 4 days prior to  admission.  The patient initially had dyspnea on exertion, but now she  even has dyspnea at rest.  It is associated with shortness of breath,  but that has now resolved.  She also has chills and chest tightness  after increased coughing bouts.  No history of fever, nausea, vomiting,  or abdominal pain.  The patient presumed it was her allergies stated at  home  using over-the-counter meds like Mucinex, no liquidy, with no  improvement noted.  She presented to the clinic.  Of note, she has had  no pet exposure.  She vacuums her house daily.  There are no sick  contacts.  In clinic, her O2 sats were 96% on room air.  She was  diffusely wheezing.  Bronchodilator peak flows were 230-250 and post  bronchodilator peak flows were 270-300.  After 30 minutes, her peak  flows decreased to 250; therefore, she was admitted for 24-hour  observation.   PHYSICAL EXAMINATION ON ADMISSION:  Her vital signs included a  temperature of 98.9, blood pressure 132/85, pulse 91, respiration rate  20, and she was sating 91% on room air.  Pertinent physical exam  findings include she was in mild distress with coughing.  Her peak flow  was 240.  She had a very thick short neck.  She had decreased air entry  bilaterally.  Bilateral diffuse wheezes and she was not using accessory  muscles of respiration.  She was slightly tachycardiac; otherwise, the  exam was nonfocal.   LABORATORY DATA ON ADMISSION:  Her white count was 8.  Her hemoglobin  was 11.8, platelet count was 300,  MCV was 80.  Sodium 131, potassium  4.0, chloride 97, bicarb 25, glucose 326, BUN 8, creatinine 0.59, total  bili 0.6, alk phos 39, AST 18, ALT 21, total protein 6.9, albumin 3.5,  calcium 0.7.  Cardiac markers were negative x3 sets.  Fasting lipid  profile revealed a total cholesterol of 181, triglycerides 94, HDL 46,  LDL 116.  Her urine pregnancy was negative.  UA was positive for 1000 of  glucose and 40 of ketones; otherwise, it was negative.  Her BNP was 102  and her urine culture was negative.   HOSPITAL COURSE:  1. Asthma exacerbation.  This is felt to be likely secondary to a      combination of the upper respiratory tract infection plus having      ran out of her albuterol and Advair.  The patient was diagnosed      with asthma at the age of 12 years.  She has never been intubated      and her presenting ABG is not available at this time.  Her chest x-      ray was negative; however, she did develop a more productive cough      during her hospital course and because she had some yellowish      sputum, she was started on Avelox for which she will complete a 5-      day course.  2. Diabetes mellitus type 2, uncontrolled.  The patient's hemoglobin      A1c was 8 at the beginning of July 2009.  While this is improved      from her previous A1c of about 9, it is not likely to get much      better just on the ACTOplus met that she was on as an outpatient.      She is also very hyperglycemic due to steroids.  Therefore, we will      with discharge her Lantus 40 units daily and metformin 1000 mg p.o.      b.i.d.  She is to check her sugars twice a day and call the clinic      if she has any values that are less than 80 or greater than 200.      She did receive minimal diabetes  education inpatient; therefore,      will need to see Ana Long as an outpatient and this should be      scheduled for her when she comes for her hospital followup.  She      did not have any hypoglycemic  events; therefore, 40 units of Lantus      and metformin should keep her within a safe range with her sugars.      I presume that she will need to have this adjusted; however, as she      is tapered off of the steroids.  3. Rest of her chronic medical issues were not active during this      hospitalization; therefore, will not be discussed during this      discharge summary.   DISCHARGE LABORATORIES AND VITAL SIGNS ON THE DAY OF DISCHARGE:  On the  day prior to discharge, her sodium was 136, potassium 4.0, chloride 101,  bicarb 28, glucose 440, BUN 15, creatinine 0.7, and calcium was 8.8.  Her white blood cell count was 19.2 (thought to be secondary to  steroids), hemoglobin 11.7, and  platelets 347.  Vital signs on discharge, temperature was 97.5, pulse  103, respiration rate 20, and blood pressure 135/81.  She was saturating  97% on room air and her last CBD was 329.   PENDING LABORATORIES:  None.      Chauncey Reading, D.O.      Eliseo Gum, M.D.  Electronically Signed    EA/MEDQ  D:  08/14/2007  T:  08/15/2007  Job:  [id number]

## 2010-07-07 NOTE — Group Therapy Note (Signed)
NAMETEAGEN, BUCIO NO.:  [id number]   MEDICAL RECORD NO.:  [id number]          PATIENT TYPE:  WOC   LOCATION:  [facility]                   FACILITY:  [facility]   PHYSICIAN:  Elsie Lincoln, MD      DATE OF BIRTH:  [birth year]   DATE OF SERVICE:                                  CLINIC NOTE   CHIEF COMPLAINT:  STD screening.   HISTORY OF PRESENT ILLNESS:  This is a 35 year old morbidly obese female  who presents for checkup and STD testing.  Her last Pap was in May of  2008, so she is not here for a Pap, but she is worried about sexually  transmitted diseases as she has a new partner at this time.  She denies  any discharge though she is concerned about a possible odor.  She does  use condoms intermittently.  She has PTLS and has a history in the past  of needing Provera along with a D&C to regulate her bleeding.  Currently  her periods remain regular, but she is worried that she might be  skipping cycles although she has not been paying close attention to it  at this time.  At her last exam, she had discussed the possibility of  getting lap banding, but she says that Medicaid will not pay for it  anymore.  She is actively seeking out a bariatric center that will  accept Medicaid.   PHYSICAL EXAMINATION:  VITAL SIGNS:  Temperature is 97.5, pulse 96,  blood pressure is 123/72, weight is 323.4, 22 pounds up from last year.  CARDIOVASCULAR:  Regular rate and rhythm.  RESPIRATORY:  Clear to auscultation bilaterally.  ABDOMEN:  Obese, soft, nontender.  GENITAL:  External genitalia is normal.  No lesions seen on the cervix.  Nulliparous.  I could not palpate her uterus or adnexa due to her size.   ASSESSMENT/PLAN:  This is a 35 year old morbidly obese female here for  STD testing.  1. Went ahead and tested her for GC and chlamydia, HIV and RPR.  2. She is concerned about being told in the past that her cervix is      falling.  It was discussed at length in the past by  both Dr. Okey Dupre      and Dr. Mia Creek and stated that she is too young for a pessary at      this time and she needed to consider weight reduction before      anything else could be considered.  She admits to understanding      that and denies any pain with intercourse that she was having in      the past secondary to this.  3. The patient needs to come back for her Pap in 2 months.     ______________________________  Elsie Lincoln, MD    ______________________________  Elsie Lincoln, MD    KL/MEDQ  D:  05/10/2007  T:  05/10/2007  Job:  [id number]

## 2010-07-07 NOTE — Procedures (Signed)
[name], Ana Long            ACCOUNT NO.:  [id number]   MEDICAL RECORD NO.:  [id number]          PATIENT TYPE:  OUT   LOCATION:  S[facility] CENTER                 FACILITY:  [facility]   PHYSICIAN:  Clinton D. Maple Hudson, MD, FCCP, FACPDATE OF BIRTH:  1975/11/30   DATE OF STUDY:  08/22/2007                            NOCTURNAL POLYSOMNOGRAM   REFERRING PHYSICIAN:  Chauncey Reading, D.O.   REFERRING PHYSICIAN:  Chauncey Reading, D.O.   INDICATION FOR STUDY:  Hypersomnia with sleep apnea.  Excessive daytime  somnolence.   EPWORTH SLEEPINESS SCORE:  19/24.  BMI 58.3.  Weight 319 pounds.  Height  62 inches.  Neck 19.5 inches.   HOME MEDICATION:  Charted and reviewed.   A previous study on November 18, 2002, gave an AHI of 135 per hour.  By  split protocol CPAP titrated to 9 CWP for an AHI of 1.3 per hour.  CPAP  titration is now requested.   SLEEP ARCHITECTURE:  Total sleep time 332 minutes with sleep efficiency  86.3%.  Stage 1 was 2.9%.  Stage 2 49.  Stage 3 9.5%.  REM 38.6% of  total sleep time.  Sleep latency 1 minute.  REM latency 49 minutes.  Awake after sleep onset 52 minutes.  Arousal index 8.1.  Bedtime  medication lantic, albuterol.   RESPIRATORY DATA:  CPAP titration protocol.  CPAP was titrated to 19  CWP, AHI 0.8 per hour.  She chose a medium Quattro full face mask with  heated humidifier.   OXYGEN DATA:  Snoring was prevented by CPAP with oxygen saturation held  with a mean of 9.19% on room air.   CARDIAC DATA:  Normal sinus rhythm.   MOVEMENT-PARASOMNIA:  No significant movement disturbance.  Bathroom x1.   IMPRESSIONS-RECOMMENDATIONS:  1. Successful CPAP titration to 19 CWP, AHI 0.8 per hour.  She chose a      medium Quattro full face mask with heated humidifier.  2. Original study on November 18, 2002, gave an AHI of 135 per hour      with successful correction on the CPAP of 9 CWP, AHI 1.3 per hour.      Clinton D. Maple Hudson, MD, [facility], FACP  Diplomate, Research scientist (medical) of Sleep Medicine  Electronically Signed     CDY/MEDQ  D:  08/26/2007 13:03:29  T:  08/26/2007 13:19:19  Job:  [id number]

## 2010-07-08 ENCOUNTER — Encounter: Payer: Self-pay | Admitting: Internal Medicine

## 2010-07-08 ENCOUNTER — Ambulatory Visit (INDEPENDENT_AMBULATORY_CARE_PROVIDER_SITE_OTHER): Payer: Medicaid Other | Admitting: Internal Medicine

## 2010-07-08 DIAGNOSIS — E119 Type 2 diabetes mellitus without complications: Secondary | ICD-10-CM

## 2010-07-08 DIAGNOSIS — G4730 Sleep apnea, unspecified: Secondary | ICD-10-CM

## 2010-07-08 DIAGNOSIS — E785 Hyperlipidemia, unspecified: Secondary | ICD-10-CM

## 2010-07-08 NOTE — Progress Notes (Signed)
  Subjective:    Patient ID: Ana Long, female    DOB: 09-Oct-1975, 35 y.o.   MRN: [id number]  HPI Here on what was to be a review of her progress after meeting with the diabetic counselor, in order to set specific goals. Since that visit she has: -Missed both appointments to [facility] -Missed a large number of doses of her insulin -Not checked any CBG reports (although she is aware of the risks of this while taking insulin) -Made no change in her diet (has a mocha-something-latte with cream etc etc on hand in clinic)  She is drowsy and having difficulty staying focused during her exam today-does not use her CPAP because if she does she sleeps so soundly she oversleeps and her foster children miss school. She states she was up almost all of last night doing house chores and cleaning. 2 of her 3 foster children have significant behavioral issues at school, requiring her to rush into school for one or another behavioral issue   Review of Systems     Objective:   Physical Exam  Somenolent but arouses easily       Assessment & Plan:   All her medical problems are in neglect as a result of her chaotic home situation, which she appears overwhelmed by. I suspect this is a chronic problem for her and may be resistant to change. She has plans to make "a lot of changes" but is not able to articulate the problems in a focused enough way to begin setting goals, and says she'll fix things when she gets away (has a week long trip planned soon)  I did not make any changes to her regimen, and simply encouraged her to consider both counseling for herself with the goal of priority setting, or following up with [facility] to begin simple diabetic goals.  As a interimn goal, my hope might be to get her A1C to a level of 9.5 or so.

## 2010-07-08 NOTE — Patient Instructions (Signed)
[facility] at [place]  You need a Dr Visit on June or July

## 2010-07-10 NOTE — Group Therapy Note (Signed)
   NAME:  Ana Long, Ana Long                      ACCOUNT NO.:  [id number]   MEDICAL RECORD NO.:  [id number]                   PATIENT TYPE:  OUT   LOCATION:  [facility]                           FACILITY:  [facility]   PHYSICIAN:  Argentina Donovan, M.D.                   DATE OF BIRTH:  Sep 03, 1975   DATE OF SERVICE:  09/13/2002                                    CLINIC NOTE   HISTORY OF PRESENT ILLNESS:  The patient is a 35 year old nulligravida with  type 2 diabetes weighing 294 pounds who has polycystic ovarian syndrome and  has been on Glucophage 500 t.i.d.  She was having periods fairly regularly  up until May.  In June she missed her period and has only spotted in July.  Her urine pregnancy test is negative and she is going to be started on  Clomid.  We will be giving her Provera 10 mg #10 b.i.d. and then to start on  five through nine with the Clomid.  We will also start her on a temperature  chart.  We discussed that with her in detail and told her that it was  necessary to come in after she took the Clomid 30 days if she has not had a  period and before the fifth day if period does start.  In addition to this  we sent her to a gastroenterologist because of esophageal reflux and also  chronic recurrent diarrhea after every meal.  We received a letter from him  that she was put on Pepcid for slight gastritis and we know that he did a  colonoscopy but she is still having the diarrhea and we do not have any  letter stating what his treatment was or what he thought of this and the  patient has not seen him in a follow-up so I will call the patient next week  and see if I can get any information for her.   IMPRESSION:  Anovulation secondary to polycystic ovarian syndrome being  treated with Glucophage and now to start her on Clomid.  Also, patient has  chronic diarrhea.                                               Argentina Donovan, M.D.    PR/MEDQ  D:  09/13/2002  T:  09/14/2002  Job:   [id number]

## 2010-07-10 NOTE — Group Therapy Note (Signed)
NAME:  Ana Long, Ana Long                      ACCOUNT NO.:  [id number]   MEDICAL RECORD NO.:  [id number]                   PATIENT TYPE:  OUT   LOCATION:  [facility]                           FACILITY:  [facility]   PHYSICIAN:  Argentina Donovan, MD                     DATE OF BIRTH:  Mar 30, 1975   DATE OF SERVICE:  05/30/2003                                    CLINIC NOTE   REASON FOR VISIT:  The patient is a 35 year old morbidly-obese 285-pound  black female who is in for a GYN visit.  She is a nulligravida patient who  had some vulvar itching not too long ago.  We placed her on Glucophage last  year for polycystic ovarian syndrome of 500 t.i.d.  Her internist has  reduced her to b.i.d. because of gastrointestinal symptoms and she is having  regular periods at this point.  He wants to change her to one of the newer  medications and I told her I did not have any objections.  We do not know  whether that will continue her having regular periods but it might be not  bad to try; it might be easier on her stomach. On examination she has large,  pendulous breasts with no dominant masses and no nipple discharge.  Her  abdomen is soft, flat, but no organomegaly able to be palpated because of  the obesity.  External genitalia:  The patient has an enormous intertrigo  that spreads over the vulva and gives the appearance of leukoplakia.  The  BUS within normal limits.  Introitus is marital.  The cervix is in a second  degree prolapse, clean, nulliparous, and Pap smear and wet prep are taken  from the vagina.  The uterus and the adnexa could not be well palpated  because of the habitus of the patient.  In addition to the above, the  patient has vague complaints of pelvic discomfort from time to time so we  are going to get a pelvic sonogram as an ovarian evaluation and uterine  evaluation cannot be done with a physical exam.  The patient will be placed  on Mycolog-II on a regular basis.  She has been  instructed with care for  intertrigo and I have told her if it does not clear up we may have to do a  biopsy of the area.   IMPRESSION:  1. Intertrigo, rule out leukoplakia.  2. Polycystic ovarian syndrome.  3. Morbid obesity.  4. Pelvic pain.                                               Argentina Donovan, MD    PR/MEDQ  D:  05/30/2003  T:  05/30/2003  Job:  [id number]

## 2010-07-10 NOTE — Group Therapy Note (Signed)
NAMEROSINE, SOLECKI NO.:  [id number]   MEDICAL RECORD NO.:  [id number]          PATIENT TYPE:  WOC   LOCATION:  [facility]                   FACILITY:  [facility]   PHYSICIAN:  Argentina Donovan, MD        DATE OF BIRTH:  [birth year]   DATE OF SERVICE:                                    CLINIC NOTE   HISTORY OF PRESENT ILLNESS:  The patient is a 36 year old morbidly obese,  289-pound black female in for her annual GYN visit who has had problems with  intertrigo.  It seems to be well controlled by using aluminum type of  deodorant.  Also, she has had some recent vulvar itching as well as vaginal  itching and a heavy, cottage-cheese-type discharge.  She is due for her Pap  smear, and is on acyclovir for suppression of herpes simplex.  She was  diabetic on Glucotrol.  She seems to be a good candidate for bariatric  surgery.  She is at a fairly stable weight now for over 2 years.   PHYSICAL EXAMINATION:  GENITOURINARY:  External genitalia is normal.  BUS  within normal limits.  The vagina is clean with heavy, aforementioned  discharge.  The cervix is nulliparous, and the uterus and adnexa could not  be outlined because of the habitus of the patient.   MEDICAL DECISION MAKING:  The patient is begun on a prescription for  Diflucan that can be renewed as well as acyclovir, and will be referred to  Dr. __________ for consideration for bariatric surgery.  Pap smear was done.      PR/MEDQ  D:  05/14/2004  T:  05/14/2004  Job:  [id number]

## 2010-07-10 NOTE — Op Note (Signed)
   NAME:  Ana Long, Ana Long                      ACCOUNT NO.:  [id number]   MEDICAL RECORD NO.:  [id number]                   PATIENT TYPE:  AMB   LOCATION:  [facility]                                 FACILITY:  [facility]   PHYSICIAN:  Graylin Shiver, M.D.                DATE OF BIRTH:  [birth year]   DATE OF PROCEDURE:  08/23/2002  DATE OF DISCHARGE:                                 OPERATIVE REPORT   PROCEDURE:  Esophagogastroduodenoscopy with biopsy for CLOtest.   INDICATION FOR PROCEDURE:  Heartburn.   PREMEDICATION:  Fentanyl 50 mcg IV, Versed 4 mg IV.   PROCEDURE:  After informed consent was obtained after explanation of the  risks of bleeding, infection, and perforation, and after conscious sedation  was achieved, the Olympus gastroscope was inserted into the oropharynx and  passed into the esophagus.  It was advanced into the esophagus, then into  the stomach and into the duodenum.  The second portion and bulb of the  duodenum along with the stomach revealed Long mild diffuse erythema compatible  with Long mild gastritis, and Long biopsy for CLOtest was obtained.  No lesions  were seen in the fundus or cardia when the scope was retroflexed.  The scope  was then straightened and brought into the esophagus.  The esophagogastric  junction was at 37 cm.  It was normal in appearance, and the esophageal  mucosa looked normal.  She tolerated the procedure well without  complications.   IMPRESSION:  Mild gastritis.   PLAN:  The CLOtest will be checked.  The patient will remain on Pepcid for  symptomatic control.                                               Graylin Shiver, M.D.    SFG/MEDQ  D:  08/23/2002  T:  08/23/2002  Job:  [id number]   cc:   [employer].

## 2010-07-10 NOTE — Consult Note (Signed)
[facility]  Patient:    Ana Long Visit Number: [id number] MRN: [id number]          Service Type: Attending:  Argentina Donovan, M.D. Dictated by:   Argentina Donovan, M.D. Proc. Date: 08/22/01                            Consultation Report  HISTORY OF PRESENT ILLNESS:  The patient is a 35 year old morbidly obese diabetic black female who has been followed long-term in the medical clinic for severe obstructive sleep apnea.  She is also on cyclic Provera and Meridia.  Her chief complaint was periods lasting three weeks over the last couple months.  She has a long history of menstrual irregularity and besides her morbid obesity she shows some signs of hirsutism, especially with fascial hair.  The patient has been evaluated in the past and has been diagnosed as polycystic ovarian syndrome and this is probably correct.  She had been given Provera in the past that worked, but has not taken it in some time.  The patients pelvic examination with the exception of the speculum examination was completely useless because of the habitus of the patient who weighs 300 pounds and the speculum examination showed a uterus within normal limits, vagina clean and well rugated, and nulliparous cervix that was clean.  The patient had a Pap smear normal last September.  PLAN:  Cycle her the first 10 days of each month on Provera 10 mg for 10 days, to renew her Meridia prescription given to her by internal medicine and if this does not control the bleeding problem we will do a D&C.  I spent time explaining to the patient the problem.  We discussed the physiology of ovulation and anovulation and also I talked to the patient about Meridia. Apparently she has not been told how it works and therefore in patients who have not been told how it works it will not work.  IMPRESSION:  Morbidly obese patient with dysfunctional uterine bleeding secondary to polycystic ovarian  syndrome. Dictated by:   Argentina Donovan, M.D. Attending:  Argentina Donovan, M.D. DD:  08/22/01 TD:  08/24/01 Job: [id number]

## 2010-07-10 NOTE — Group Therapy Note (Signed)
Ana, Long NO.:  [id number]   MEDICAL RECORD NO.:  [id number]          PATIENT TYPE:  WOC   LOCATION:  [facility]                   FACILITY:  [facility]   PHYSICIAN:  Ginger Carne, MD DATE OF BIRTH:  25-Jul-1975   DATE OF SERVICE:  12/03/2005                                    CLINIC NOTE   REASON FOR VISIT:  Follow-up from April 2007 consultation and dyspareunia.  Note this is a 35 year old nulligravida, African American, obese female  (morbidly obese), 5 feet 2 inches tall, who has complaints of dyspareunia.  She was seen by Dr. Okey Dupre in April 2007.  At that time a second-degree  prolapse was noted; however, the uterus and adnexa could not be well  palpated because of her habitus.  I discussed with the patient the findings  and did not feel it warranted another pelvic examination.  I explained to  the patient that due to her size she needs to engage in a dietary program  and/or consider lifestyle modifications to have significant weight loss  before this issue can be addressed.  She is too young to use a pessary, and  I explained to her that any issues related to surgery would take a back seat  to her weight loss due to the risks and complications of any surgical  procedure in the pelvic area.  The patient will follow through on lifestyle  changes.           ______________________________  Ginger Carne, MD     SHB/MEDQ  D:  12/03/2005  T:  12/05/2005  Job:  [id number]

## 2010-07-10 NOTE — Group Therapy Note (Signed)
Ana Long, FRIEL NO.:  [id number]   MEDICAL RECORD NO.:  [id number]          PATIENT TYPE:  WOC   LOCATION:  [facility]                   FACILITY:  [facility]   PHYSICIAN:  Tinnie Gens, MD        DATE OF BIRTH:  [birth year]   DATE OF SERVICE:  04/14/2006                                  CLINIC NOTE   CHIEF COMPLAINT:  Yearly exam and follow-up MAU.   HISTORY OF PRESENT ILLNESS:  The patient is a 35 year old G0 who has  previously been here and treated for infertility.  Has a history of PCOS  who had previously been on Provera to regulate her cycles; however, she  reports her cycles have been fairly regular over the last couple of  years and she has not needed Provera but in January she had a very heavy  cycle and she is unclear when her last period was before that.  She  states she was bleeding through the tampon and a pad at the same time  and was having to change approximately every hour.  She came to the MAU.  She was given breathing treatment.  She had a hemoglobin of 12 and  normal wet prep and a normal-appearing pelvic sonogram.  She was given  here to follow-up.  She also would like her Pap smear today if possible.   On exam, she is an obese female in no acute distress.  Abdomen is soft,  nontender, and nondistended.  The rest of the GU exam is deferred today  because the patient is not in need of a Pap smear.  Her last Pap done in  this clinic was April of 2007.   IMPRESSION:  Abnormal bleeding probably related to obesity, polycystic  ovary syndrome and ovulation.   PLAN:  1. I have refilled her Provera.  She will use this sparingly as she      notes that she has not had a cycle recently.  2. Follow up in May for a yearly exam with clinical breast exam as      well as Pap smear.  3. Have refilled her glyburide today.  She will follow up with the      Outpatient Clinic as needed.           ______________________________  Tinnie Gens, MD     TP/MEDQ  D:  04/14/2006  T:  04/14/2006  Job:  [id number]

## 2010-07-10 NOTE — Op Note (Signed)
   NAME:  Ana Long, Ana Long                      ACCOUNT NO.:  [id number]   MEDICAL RECORD NO.:  [id number]                   PATIENT TYPE:  AMB   LOCATION:  [facility]                                 FACILITY:  [facility]   PHYSICIAN:  Graylin Shiver, M.D.                DATE OF BIRTH:  09/08/1975   DATE OF PROCEDURE:  08/23/2002  DATE OF DISCHARGE:                                 OPERATIVE REPORT   PROCEDURE:  Colonoscopy.   INDICATIONS FOR PROCEDURE:  Rectal bleeding, rule out colon.   CONSENT:  Informed consent was obtained after explanation of the risks of  bleeding, infection, perforation.   PREMEDICATION:  Total dose of fentanyl 75 mcg IV, Versed 6 mg IV.   DESCRIPTION OF PROCEDURE:  With the patient in the left lateral decubitus  position, the rectal exam was performed.  No masses were felt.  The Olympus  colonoscope was inserted into the rectum and advanced around the colon to  the cecum.  Cecal landmarks were identified.  The cecum and ascending colon  were normal.  The transverse colon was normal.  The descending colon,  sigmoid and rectum were normal.  The scope was retroflexed in the rectum.  No abnormalities were seen.  She tolerated the procedure well without  complications.   IMPRESSION:  Normal colonoscopy to the cecum.                                               Graylin Shiver, M.D.    Germain Osgood  D:  08/23/2002  T:  08/23/2002  Job:  [id number]

## 2010-07-10 NOTE — Op Note (Signed)
   NAME:  Ana Long, Ana Long                      ACCOUNT NO.:  [id number]   MEDICAL RECORD NO.:  [id number]                   PATIENT TYPE:  AMB   LOCATION:  [facility]                                  [facility]   PHYSICIAN:  Phil D. Okey Dupre, M.D.                  DATE OF BIRTH:  1975-12-31   DATE OF PROCEDURE:  01/08/2002  DATE OF DISCHARGE:                                 OPERATIVE REPORT   PROCEDURE:  Hysteroscopic examination, dilatation, curettage.   PREOPERATIVE DIAGNOSES:  1. Oligomenorrhea with menorrhagia.  2. Morbid obesity.   POSTOPERATIVE DIAGNOSES:  1. Oligomenorrhea with menorrhagia.  2. Morbid obesity.   DESCRIPTION OF PROCEDURE:  Under satisfactory IV sedation with the patient  in the dorsal lithotomy position, bimanual pelvic examination was done.  The  cervix was prolapsed almost to the introitus.  The uterus could not be well  outlined because of the obesity of the patient, and the adnexa could not be  palpated.  Long weighted speculum was placed in the posterior fourchette of the  vagina through Long marital introitus.  BUS was within normal limits, the  vagina was clean and well rugated, and the anterior lip of the cervix was  grasped with Long single tooth tenaculum which brought it down to the  introitus.  The uterine cavity was then sounded to Long depth of 10 cm, the  cervical os dilated to Long #7 Hegar dilator.  Long 30-degree hysteroscope was  inserted using normal saline as the dilating medium and the entire uterine  cavity was easily visualized.  What we saw was Long hyperplastic, pale  endometrium with no active bleeding, no polyps were noted, and no  leiomyomata.  The scope was removed and the uterine cavity was curetted  vigorously with Long small sharp followed by Long serrated curette and this was  sent for pathological diagnosis.  The tenaculum was removed from the cervix  and the speculum from the vagina.  The patient was transferred to the  recovery room in satisfactory  condition after having tolerated the procedure  well.                                               Phil D. Okey Dupre, M.D.    PDR/MEDQ  D:  01/08/2002  T:  01/08/2002  Job:  [id number]

## 2010-07-10 NOTE — Group Therapy Note (Signed)
[name], Long NO.:  [id number]   MEDICAL RECORD NO.:  [id number]          PATIENT TYPE:  WOC   LOCATION:  [facility]                   FACILITY:  [facility]   PHYSICIAN:  Argentina Donovan, MD        DATE OF BIRTH:  16-Aug-1975   DATE OF SERVICE:  05/27/2005                                    CLINIC NOTE   The patient is a 35 year old nulligravida African-American female who is  morbidly obese at 295 pounds and 5 feet 2 inches tall.  She is a diabetic  and on oral medications at this point.  She is in for her routine annual Pap  smear and since her last visit her father and mother have passed away and  she is obligated to take two children from her younger sister that she has  living with her, a 45 and a 41-year-old but she seems to be very happy about  this.  She never did go for her bariatric surgery because of the family  problems that have ensued.  now I think she may have time.  We are going to  refer her to Dr. Luan Pulling.  On examination the thing __________ was the  intertrigo under the breast which we have prescribed Mycolog 2 cream  followed by aluminum deodorant for prophylaxis.  The groin intertrigo has  resolved nicely.  On examination external genitalia is normal.  BUS within  normal limits.  The vagina is clean and well rugated.  The cervix is a  second-degree prolapse almost close to the introitus, nulliparous.  The  uterus and adnexa obviously could not be well palpated because of habitus of  the patient.   IMPRESSION:  Uterine prolapse with intertrigo.           ______________________________  Argentina Donovan, MD     PR/MEDQ  D:  05/27/2005  T:  05/28/2005  Job:  [id number]

## 2010-07-23 ENCOUNTER — Encounter: Payer: Self-pay | Admitting: Internal Medicine

## 2010-09-03 ENCOUNTER — Encounter: Payer: Self-pay | Admitting: Internal Medicine

## 2010-09-03 ENCOUNTER — Telehealth: Payer: Self-pay | Admitting: *Deleted

## 2010-09-03 ENCOUNTER — Ambulatory Visit (INDEPENDENT_AMBULATORY_CARE_PROVIDER_SITE_OTHER): Payer: Medicaid Other | Admitting: Internal Medicine

## 2010-09-03 VITALS — BP 128/81 | HR 91 | Temp 98.2°F | Ht 62.0 in | Wt 310.1 lb

## 2010-09-03 DIAGNOSIS — J45901 Unspecified asthma with (acute) exacerbation: Secondary | ICD-10-CM

## 2010-09-03 DIAGNOSIS — E119 Type 2 diabetes mellitus without complications: Secondary | ICD-10-CM

## 2010-09-03 MED ORDER — ALBUTEROL SULFATE (2.5 MG/3ML) 0.083% IN NEBU
2.5000 mg | INHALATION_SOLUTION | Freq: Once | RESPIRATORY_TRACT | Status: AC
  Administered 2010-09-03: 2.5 mg via RESPIRATORY_TRACT

## 2010-09-03 MED ORDER — FLUTICASONE-SALMETEROL 250-50 MCG/DOSE IN AEPB: 1.0000 | INHALATION_SPRAY | Freq: Two times a day (BID) | RESPIRATORY_TRACT | Status: DC

## 2010-09-03 MED ORDER — AZITHROMYCIN 250 MG PO TABS: ORAL_TABLET | ORAL | Status: DC

## 2010-09-03 MED ORDER — PREDNISONE 10 MG PO TABS: ORAL_TABLET | ORAL | Status: DC

## 2010-09-03 MED ORDER — IPRATROPIUM BROMIDE 0.02 % IN SOLN
0.5000 mg | Freq: Once | RESPIRATORY_TRACT | Status: AC
  Administered 2010-09-03: 0.5 mg via RESPIRATORY_TRACT

## 2010-09-03 MED ORDER — ALBUTEROL 90 MCG/ACT IN AERS: 1.0000 | INHALATION_SPRAY | RESPIRATORY_TRACT | Status: DC | PRN

## 2010-09-03 NOTE — Patient Instructions (Signed)
1. Please get the over-the-counter  Robitussion for cough. 2. Please take the medications as prescribed. Please come back if not get better.

## 2010-09-03 NOTE — Telephone Encounter (Signed)
Pt called asking for a breathing treatment for asthma.  Her inhalers have not helped. SOB last 2-3 days.  Productive cough.  Will see today

## 2010-09-03 NOTE — Assessment & Plan Note (Addendum)
Patient symptom is mostly likely caused by mild asthma exacerbation. Differentials include atypical PNA and bronchitis. Patient was treated at clinic with Albuterol and Atrovent nebulizer. She is to start with five days of Azithromycin (two 250 mg tablets on first day, one tablet for another 4 days)  and Prednisone (40, 30, 20, 10  And 10 mg for day 1, 2, 3, 4 and 5, respectively), as well as Robitussion. After treated with Albuterol and Atrovent nebulizer at clinic, patient's peak flow rate was 250 for 3 times. Patient was sent home and instructed to come back if not gets better.

## 2010-09-03 NOTE — Progress Notes (Signed)
  Subjective:    Patient ID: Ana Long, female    DOB: 1975/11/08, 35 y.o.   MRN: [id number]  HPI Patient is 35 YO female with past medical history of asthma, DM-II, hyperlipidemia and morbid obesity presents with cough, SOB and tightness in her chest. Patient reports that she has been using Albuterol and Advair to control her breathing problem. Recently she has not used Advair and only used Albuterol inhaler proximately once a month. She reports that her symptoms started after she accompanied her sick friend to the hospital a week ago. Per patient, her friend had cough and sore throat, and was diagnosed with bronchitis. She coughs up yellow colored sputum without blood in it. Her symptoms are aggravated by activity and not alleviated by her usual dose of  Albuterol. It is getting worse since it started. She denies chest pain, fever, chills and palpitation.    Review of Systems  Constitutional: Positive for fatigue. Negative for fever, chills, diaphoresis, activity change, appetite change and unexpected weight change.  HENT: Positive for sore throat. Negative for hearing loss, ear pain, nosebleeds, congestion, facial swelling, rhinorrhea, sneezing, drooling, mouth sores, trouble swallowing, neck pain, neck stiffness, dental problem, voice change, postnasal drip, sinus pressure, tinnitus and ear discharge.   Eyes: Negative for photophobia, pain, discharge, redness, itching and visual disturbance.  Respiratory: Positive for cough, chest tightness, shortness of breath and wheezing. Negative for apnea, choking and stridor.   Cardiovascular: Negative for chest pain, palpitations and leg swelling.  Gastrointestinal: Negative for abdominal pain, diarrhea, constipation, blood in stool, abdominal distention and anal bleeding.  Genitourinary: Negative.   Musculoskeletal: Negative for myalgias, back pain, joint swelling, arthralgias and gait problem.  Skin: Negative for color change, pallor, rash  and wound.  Neurological: Negative for dizziness, tremors, seizures, syncope, facial asymmetry, speech difficulty, weakness, light-headedness, numbness and headaches.  Hematological: Negative for adenopathy. Does not bruise/bleed easily.  Psychiatric/Behavioral: Negative.        Objective:   Physical Exam    General: tired, alert, well-developed, and cooperative to examination.  Mouth: pharynx pink and moist, no erythema, and no exudates.  Neck: supple, full ROM, no thyromegaly, no JVD, and no carotid bruits.  Lungs: Decreased air movement bilaterally, no rhonchi, rubs or rales, wheezing bilaterally.  Heart: normal rate, regular rhythm, no murmur, no gallop, and no rub.  Abdomen: soft, non-tender, normal bowel sounds, no distention, no guarding, no rebound tenderness, no hepatomegaly, and no splenomegaly.  Msk: no joint swelling, no joint warmth, and no redness over joints.  Pulses: 2+ DP/PT pulses bilaterally Extremities: No cyanosis, clubbing, edema Neurologic: alert, strength normal in all extremities, sensation intact to light touch, and gait normal.  Skin: turgor normal and no rashes.        Assessment & Plan:

## 2010-09-07 NOTE — Progress Notes (Signed)
I saw, examined, and discussed the patient with Dr Clyde Lundborg and agree with the note contained here. Pt's MD took her off Advair a long time ago. Only requires Alb once a month. Sometimes uses her daughter's neb. Now increase in sxs -like her typical flare. Flares respond to ABX and prednisone in the past. Has required hospitalization but never vent support. Very mild wheezing bc very poor air movement but it did improve after neb. Outpt tx and pt knows to come to ER if no better or gets worse. I think her obesity is contributing greatly to her sxs - she is trying to loose weight. Also has OSA.

## 2010-09-18 ENCOUNTER — Other Ambulatory Visit: Payer: Self-pay | Admitting: Internal Medicine

## 2010-09-18 NOTE — Telephone Encounter (Signed)
It is not clear why the patient is on Acyclovir. Will first discuss with the patient and then refill.  Thanks, Almyra Deforest

## 2010-09-21 ENCOUNTER — Other Ambulatory Visit: Payer: Self-pay | Admitting: Internal Medicine

## 2010-09-21 DIAGNOSIS — B373 Candidiasis of vulva and vagina: Secondary | ICD-10-CM

## 2010-09-21 MED ORDER — FLUCONAZOLE 150 MG PO TABS: 150.0000 mg | ORAL_TABLET | Freq: Once | ORAL | Status: DC

## 2010-10-12 ENCOUNTER — Encounter: Payer: Medicaid Other | Admitting: Internal Medicine

## 2010-10-27 ENCOUNTER — Other Ambulatory Visit: Payer: Self-pay | Admitting: Internal Medicine

## 2010-11-03 ENCOUNTER — Other Ambulatory Visit: Payer: Self-pay | Admitting: Internal Medicine

## 2010-11-03 DIAGNOSIS — B373 Candidiasis of vulva and vagina: Secondary | ICD-10-CM

## 2010-11-09 ENCOUNTER — Encounter: Payer: Medicaid Other | Admitting: Internal Medicine

## 2010-11-13 LAB — BLOOD GAS, ARTERIAL
Acid-Base Excess: 5 — ABNORMAL HIGH
Bicarbonate: 29.3 — ABNORMAL HIGH
O2 Saturation: 96.6
pO2, Arterial: 84.5

## 2010-11-19 LAB — CARDIAC PANEL(CRET KIN+CKTOT+MB+TROPI)
CK, MB: 1.5
Total CK: 117
Troponin I: 0.01
Troponin I: 0.01

## 2010-11-19 LAB — BASIC METABOLIC PANEL
BUN: 15
BUN: 8
CO2: 28
Calcium: 8.8
Calcium: 9.1
Creatinine, Ser: 0.51
Creatinine, Ser: 0.7
GFR calc Af Amer: >60
GFR calc non Af Amer: >60
Glucose, Bld: 360 — ABNORMAL HIGH
Glucose, Bld: 440 — ABNORMAL HIGH
Sodium: 136

## 2010-11-19 LAB — LIPID PANEL
HDL: 46
Total CHOL/HDL Ratio: 3.9
VLDL: 19

## 2010-11-19 LAB — URINE CULTURE
Colony Count: NO GROWTH
Colony Count: NO GROWTH
Special Requests: NEGATIVE

## 2010-11-19 LAB — CBC
Hemoglobin: 11.8 — ABNORMAL LOW
Hemoglobin: 11.8 — ABNORMAL LOW
MCHC: 32
MCHC: 32.2
MCV: 79.9
MCV: 80.2
Platelets: 347
RBC: 4.59
RBC: 4.6
RDW: 16.2 — ABNORMAL HIGH

## 2010-11-19 LAB — URINALYSIS, ROUTINE W REFLEX MICROSCOPIC
Bilirubin Urine: NEGATIVE
Glucose, UA: >1000 — AB
Ketones, ur: 40 — AB
Ketones, ur: NEGATIVE
Leukocytes, UA: NEGATIVE
Leukocytes, UA: NEGATIVE
Nitrite: NEGATIVE
Specific Gravity, Urine: 1.042 — ABNORMAL HIGH
pH: 6
pH: 6

## 2010-11-19 LAB — URINE MICROSCOPIC-ADD ON

## 2010-11-19 LAB — DIFFERENTIAL
Basophils Absolute: 0
Basophils Relative: 0
Eosinophils Absolute: 0
Lymphs Abs: 1.2
Neutrophils Relative %: 82 — ABNORMAL HIGH

## 2010-11-19 LAB — B-NATRIURETIC PEPTIDE (CONVERTED LAB): Pro B Natriuretic peptide (BNP): 102 — ABNORMAL HIGH

## 2010-11-19 LAB — COMPREHENSIVE METABOLIC PANEL
ALT: 21
CO2: 25
Calcium: 8.7
GFR calc non Af Amer: >60
Glucose, Bld: 326 — ABNORMAL HIGH
Sodium: 131 — ABNORMAL LOW
Total Bilirubin: 0.6

## 2010-11-19 LAB — GLUCOSE, RANDOM: Glucose, Bld: 496 — ABNORMAL HIGH

## 2010-11-26 ENCOUNTER — Ambulatory Visit (INDEPENDENT_AMBULATORY_CARE_PROVIDER_SITE_OTHER): Payer: Medicaid Other | Admitting: Family Medicine

## 2010-11-26 DIAGNOSIS — M67919 Unspecified disorder of synovium and tendon, unspecified shoulder: Secondary | ICD-10-CM

## 2010-11-26 DIAGNOSIS — IMO0002 Reserved for concepts with insufficient information to code with codable children: Secondary | ICD-10-CM

## 2010-11-26 DIAGNOSIS — M7580 Other shoulder lesions, unspecified shoulder: Secondary | ICD-10-CM

## 2010-11-26 DIAGNOSIS — M1710 Unilateral primary osteoarthritis, unspecified knee: Secondary | ICD-10-CM

## 2010-11-26 DIAGNOSIS — M719 Bursopathy, unspecified: Secondary | ICD-10-CM

## 2010-11-27 LAB — BASIC METABOLIC PANEL
Chloride: 102 mEq/L (ref 96–112)
Creatinine, Ser: 0.66 mg/dL (ref 0.4–1.2)
GFR calc Af Amer: >60 mL/min (ref >60)
GFR calc non Af Amer: >60 mL/min (ref >60)
Potassium: 3.6 mEq/L (ref 3.5–5.1)

## 2010-11-27 LAB — D-DIMER, QUANTITATIVE: D-Dimer, Quant: <0.22 ug/mL-FEU (ref 0.00–0.48)

## 2010-11-27 LAB — GLUCOSE, CAPILLARY: Glucose-Capillary: 166 mg/dL — ABNORMAL HIGH (ref 70–99)

## 2010-11-27 LAB — POCT CARDIAC MARKERS: Troponin i, poc: <0.05 ng/mL (ref 0.00–0.09)

## 2010-11-29 NOTE — Progress Notes (Signed)
Subjective:    Patient ID: Ana Long, female    DOB: 09-Feb-1976, 35 y.o.   MRN: [id number]  HPI  Patient returns will follow for RIGHT knee pain with known moderate to severe arthritic changes in a morbidly obese patient, far advanced for age. She had an intra-articular knee injection about 5-6 months ago and had her relief of symptoms and a good outcome after this. She also is morbidly obese and on insulin. Other medications are detailed below.  Patient will come for also complains of a new sided LEFT shoulder pain, without any noted particular injury. She has pain with abduction and reaching across her body as well as terminal internal range of motion. There is a dull ache anteriorly and laterally. She has effectively no loss of motion. No numbness or tingling down her arm or into her shoulder blade.  Patient Active Problem List  Diagnoses  . PHARYNGITIS, STREPTOCOCCAL  . GENITAL HERPES  . CANDIDIASIS, VAGINAL  . DIABETES MELLITUS, TYPE II  . HYPERLIPIDEMIA  . MORBID OBESITY  . ANEMIA  . DEPRESSION  . CARPAL TUNNEL SYNDROME  . ACUTE PHARYNGITIS  . TONSILLITIS, RECURRENT  . GERD  . CONSTIPATION  . PRURITUS  . LOW BACK PAIN, CHRONIC  . DRY SKIN  . HYPERSOMNIA, ASSOCIATED WITH SLEEP APNEA  . SLEEP APNEA  . NUMBNESS, HAND  . LEG EDEMA, BILATERAL  . COUGH  . DYSURIA  . Osteoarthrosis, unspecified whether generalized or localized, lower leg  . Asthma exacerbation, mild   Past Medical History  Diagnosis Date  . GERD (gastroesophageal reflux disease)   . Asthma   . Diabetes mellitus   . Lower back pain   . Hyperlipidemia   . Sleep apnea, obstructive     CPAP at  night  . Bilateral carpal tunnel syndrome   . Depression     intolerant to Metformin  . Herpes genital   . Allergy   . Allergic rhinitis 02/22/1998   No past surgical history on file. History  Substance Use Topics  . Smoking status: Former Smoker -- 0.2 packs/day for 10 years    Types: Cigarettes     Quit date: 05/24/2010  . Smokeless tobacco: Not on file  . Alcohol Use: No   No family history on file. Allergies  Allergen Reactions  . Doxycycline   . Penicillins    Current Outpatient Prescriptions on File Prior to Visit  Medication Sig Dispense Refill  . acyclovir (ZOVIRAX) 200 MG capsule TAKE 1 CAPSULE (200 MG TOTAL) BY MOUTH DAILY.  30 capsule  0  . albuterol (PROVENTIL,VENTOLIN) 90 MCG/ACT inhaler Inhale 1-2 puffs into the lungs every 4 (four) hours as needed. For SOB,wheezing or coughing.  17 g  11  . azithromycin (ZITHROMAX) 250 MG tablet Take two pills today. Then take one pill daily until gone  6 each  0  . Diclofenac Epolamine (FLECTOR) 1.3 % PTCH Place 1 patch onto the skin 2 (two) times daily. On the affected joint       . famotidine (PEPCID) 40 MG tablet Take 1 tablet (40 mg total) by mouth daily.  30 tablet  3  . fluconazole (DIFLUCAN) 150 MG tablet TAKE ONE TABLET BY MOUTH AS ONE DOSE  1 tablet  3  . fluticasone (FLONASE) 50 MCG/ACT nasal spray 2 sprays by Nasal route daily.  60 Act  11  . Fluticasone-Salmeterol (ADVAIR DISKUS) 250-50 MCG/DOSE AEPB Inhale 1 puff into the lungs 2 (two) times daily.  60 each  3  . Fluticasone-Salmeterol (ADVAIR DISKUS) 500-50 MCG/DOSE AEPB Inhale 1 puff into the lungs 2 (two) times daily.        Marland Kitchen glipiZIDE (GLUCOTROL) 5 MG tablet TAKE ONE TABLET BY MOUTH TWICE DAILY  60 tablet  2  . glucose blood (ACCU-CHEK AVIVA) test strip Use to check blood sugar up to 2 times daily  100 each  11  . ibuprofen (ADVIL,MOTRIN) 200 MG tablet Take 800 mg by mouth 3 (three) times daily as needed.        . insulin glargine (LANTUS) 100 UNIT/ML injection Inject 100 Units into the skin at bedtime.  10 mL  11  . Insulin Syringe-Needle U-100 (BD INSULIN SYRINGE ULTRAFINE) 31G X 5/16" 1 ML MISC Inject 1 Syringe into the skin daily. Use as directed for insulin injections  100 each  11  . lactulose (CHRONULAC) 10 GM/15ML solution Take 15ml by mouth once or twice  daily as needed for constipation       . Lancets (ACCU-CHEK MULTICLIX) lancets Use to check blood sugar up to 2 times daily  100 each  11  . metaxalone (SKELAXIN) 800 MG tablet Take 800 mg by mouth 3 (three) times daily as needed.        . pravastatin (PRAVACHOL) 40 MG tablet Take 1 tablet (40 mg total) by mouth at bedtime.  30 tablet  11  . predniSONE (DELTASONE) 10 MG tablet Take 4 pills today, then 3 pills on Friday, then 2 pills on Sat, then 1 pill on Sun and Monday  11 tablet  0  . TraMADol HCl 50 MG TBSO Take by mouth. 1-2 tablets every 8 hours as needed for pain       . Wheat Dextrin (BENEFIBER) POWD Add 1 scoop to beverage of choice per day          Review of Systems REVIEW OF SYSTEMS  GEN: No fevers, chills. Nontoxic. Primarily MSK c/o today. MSK: Detailed in the HPI GI: tolerating PO intake without difficulty Neuro: No numbness, parasthesias, or tingling associated. Otherwise the pertinent positives of the ROS are noted above.      Objective:   Physical Exam   Physical Exam  There were no vitals taken for this visit.  GEN: WDWN, NAD, Non-toxic, A & O x 3. Obese. HEENT: Atraumatic, Normocephalic. Neck supple. No masses, No LAD. Ears and Nose: No external deformity. EXTR: No c/c/e NEURO Normal gait.  PSYCH: Normally interactive. Conversant. Not depressed or anxious appearing.  Calm demeanor.   Knee: full extension. Flexion to 115. Nontender to McMurray's test. Mild tenderness on the medial joint line. Mild tenderness on the lateral joint line. Nontender with patellar manipulation and facet loading. Stable anterior and posterior door testing. Stable MCL and LCL.  Shoulder: LEFT Inspection: No muscle wasting or winging Ecchymosis/edema: neg  AC joint, scapula, clavicle: NT Cervical spine: NT, full ROM Spurling's: neg Abduction: full, 4+/5 Flexion: full, 5/5 IR, full, lift-off: 5/5 ER at neutral: full, 5/5 AC crossover: pos Neer: pos Hawkins: pos Drop Test:  neg Empty Can: pos Supraspinatus insertion: mild-mod T Bicipital groove: NT Speed's: neg Yergason's: neg Sulcus sign: neg Scapular dyskinesis: none C5-T1 intact  Neuro: Sensation intact Grip 5/5      Assessment & Plan:   1. Osteoarthrosis, unspecified whether generalized or localized, lower leg    2. Rotator cuff tendonitis  Ambulatory referral to Physical Therapy    Continue with Tylenol, ice, and NSAIDs as needed for arthritis as well as p.r.n.  Topical medications.  Knee Injection, RIGHT Patient verbally consented to procedure. Risks, benefits, and alternatives explained. Sterilely prepped with betadine. Ethyl cholride used for anesthesia. 9 cc Lidocaine 1% mixed with 1 cc of Depo-Medrol 40 mg injected using the anterolateral approach without difficulty. No complications with procedure and tolerated well. Patient had decreased pain post-injection.   PT for RTC and HEP initiated, recheck 1 mo

## 2010-11-30 LAB — POCT PREGNANCY, URINE: Operator id: 26188

## 2010-12-01 ENCOUNTER — Other Ambulatory Visit: Payer: Self-pay | Admitting: Internal Medicine

## 2010-12-01 DIAGNOSIS — A6000 Herpesviral infection of urogenital system, unspecified: Secondary | ICD-10-CM

## 2010-12-07 LAB — COMPREHENSIVE METABOLIC PANEL
Albumin: 3.5
Alkaline Phosphatase: 38 — ABNORMAL LOW
BUN: 8
Calcium: 8.9
Creatinine, Ser: 0.76
Glucose, Bld: 272 — ABNORMAL HIGH
Total Protein: 7.1

## 2010-12-07 LAB — GC/CHLAMYDIA PROBE AMP, GENITAL
Chlamydia, DNA Probe: NEGATIVE
GC Probe Amp, Genital: NEGATIVE

## 2010-12-07 LAB — CBC
HCT: 35.7 — ABNORMAL LOW
Hemoglobin: 11.6 — ABNORMAL LOW
MCHC: 32.4
MCV: 79.2
MCV: 80.3
Platelets: 303
Platelets: 296
RDW: 16 — ABNORMAL HIGH
RDW: 15.9 — ABNORMAL HIGH
WBC: 7.8

## 2010-12-07 LAB — WET PREP, GENITAL
Clue Cells Wet Prep HPF POC: NONE SEEN
Yeast Wet Prep HPF POC: NONE SEEN

## 2010-12-07 LAB — URINALYSIS, ROUTINE W REFLEX MICROSCOPIC
Bilirubin Urine: NEGATIVE
Hgb urine dipstick: NEGATIVE
Ketones, ur: NEGATIVE
Nitrite: NEGATIVE
Protein, ur: NEGATIVE
Specific Gravity, Urine: 1.025
Urobilinogen, UA: 0.2

## 2010-12-07 LAB — DIFFERENTIAL
Basophils Absolute: 0.1
Eosinophils Absolute: 0.1
Lymphs Abs: 3
Neutrophils Relative %: 53

## 2010-12-07 LAB — HCG, SERUM, QUALITATIVE: Preg, Serum: NEGATIVE

## 2010-12-07 LAB — POCT PREGNANCY, URINE: Operator id: 171901

## 2010-12-07 LAB — LIPASE, BLOOD: Lipase: 19

## 2010-12-08 ENCOUNTER — Other Ambulatory Visit: Payer: Medicaid Other

## 2010-12-09 ENCOUNTER — Other Ambulatory Visit (INDEPENDENT_AMBULATORY_CARE_PROVIDER_SITE_OTHER): Payer: Medicaid Other

## 2010-12-09 ENCOUNTER — Other Ambulatory Visit: Payer: Self-pay

## 2010-12-09 VITALS — Temp 98.9°F

## 2010-12-09 DIAGNOSIS — E119 Type 2 diabetes mellitus without complications: Secondary | ICD-10-CM

## 2010-12-09 DIAGNOSIS — N390 Urinary tract infection, site not specified: Secondary | ICD-10-CM

## 2010-12-09 DIAGNOSIS — O24919 Unspecified diabetes mellitus in pregnancy, unspecified trimester: Secondary | ICD-10-CM

## 2010-12-09 LAB — POCT URINALYSIS DIP (DEVICE)
Ketones, ur: NEGATIVE mg/dL
Leukocytes, UA: NEGATIVE
Protein, ur: NEGATIVE mg/dL
Urobilinogen, UA: 0.2 mg/dL (ref 0.0–1.0)
pH: 6 (ref 5.0–8.0)

## 2010-12-09 LAB — GLUCOSE, CAPILLARY: Glucose-Capillary: 322 mg/dL — ABNORMAL HIGH (ref 70–99)

## 2010-12-09 MED ORDER — CONDOMS NON-LATEX NON-LUBRICAT DEVI: 1.0000 [IU] | Status: DC | PRN

## 2010-12-09 MED ORDER — FLUCONAZOLE 150 MG PO TABS: 150.0000 mg | ORAL_TABLET | Freq: Once | ORAL | Status: AC

## 2010-12-09 NOTE — Patient Instructions (Signed)
Follow up as scheduled for your annual exam. If your symptoms worsen, follow up sooner.   You should check your blood sugar and take your insulin as prescribed. Follow up as soon as possible with your regular doctor regarding your diabetes.

## 2010-12-09 NOTE — Progress Notes (Signed)
Subjective: Ana Long is a 35 y.o. female presenting to the clinic today complaining of 3-4 days of bladder pressure, urgency, nocturia, and difficulty starting urination. She has not experienced any burning with urination or incontinence. Her primary concern is urinary tract infection. She denies fever. No vaginal discharge, bleeding, cramping, or pain. Does report some mild vaginal itching and a history of yeast infections. She has never been pregnant and has no reported history of abdominal surgeries. She also reports a sensitivity to latex and is requesting a prescription for latex-free condoms stating "that may be the cause of my itching".  Objective: Filed Vitals:   12/09/10 1136  Temp: 98.9 F (37.2 C)   Patient appears lethargic with difficulty staying awake and answering questions, but she is oriented x 3. Pelvic exam deferred as patient got dressed and fell asleep in chair before a chaperone could be found.   Results for orders placed in visit on 12/09/10 (from the past 24 hour(s))  GLUCOSE, CAPILLARY     Status: Abnormal   Collection Time   12/09/10 12:03 PM      Component Value Range   Glucose-Capillary 322 (*) 70 - 99 (mg/dL)   No evidence of UTI on urine dipstick w/ glucosuria.  Assessment/Plan: 1. 35 y.o. Female with urgency, pelvic pressure, and difficulty urinating. 2. Advised patient to follow up with primary care physician regarding diabetic med compliance, insulin dosing, and blood sugar management 3. Recommend follow up at scheduled annual exam in November 4. Rx Diflucan x 2 days (eprescribed) 5. Rx latex-free condoms (eprescribed)   I evaluated pt with PA-S and agree with above note

## 2010-12-10 LAB — URINE CULTURE: Colony Count: NO GROWTH

## 2010-12-11 ENCOUNTER — Telehealth: Payer: Self-pay | Admitting: Dietician

## 2010-12-11 NOTE — Telephone Encounter (Signed)
Patient says she thinks she saw Dr. Dione Booze this year. Gave me permission to call to find out and asked that I call her back if she has not so she can schedule an appointment.  Faxed report request to Dr.Groat's office.

## 2010-12-13 ENCOUNTER — Emergency Department (INDEPENDENT_AMBULATORY_CARE_PROVIDER_SITE_OTHER): Payer: Medicaid Other

## 2010-12-13 ENCOUNTER — Emergency Department (HOSPITAL_BASED_OUTPATIENT_CLINIC_OR_DEPARTMENT_OTHER)
Admission: EM | Admit: 2010-12-13 | Discharge: 2010-12-13 | Disposition: A | Discharge status: Home / Self Care | Payer: Medicaid Other | Attending: Emergency Medicine | Admitting: Emergency Medicine

## 2010-12-13 ENCOUNTER — Encounter (HOSPITAL_BASED_OUTPATIENT_CLINIC_OR_DEPARTMENT_OTHER): Payer: Self-pay | Admitting: Emergency Medicine

## 2010-12-13 DIAGNOSIS — E785 Hyperlipidemia, unspecified: Secondary | ICD-10-CM | POA: Insufficient documentation

## 2010-12-13 DIAGNOSIS — K219 Gastro-esophageal reflux disease without esophagitis: Secondary | ICD-10-CM | POA: Insufficient documentation

## 2010-12-13 DIAGNOSIS — J45909 Unspecified asthma, uncomplicated: Secondary | ICD-10-CM | POA: Insufficient documentation

## 2010-12-13 DIAGNOSIS — R0789 Other chest pain: Secondary | ICD-10-CM

## 2010-12-13 DIAGNOSIS — M25559 Pain in unspecified hip: Secondary | ICD-10-CM | POA: Insufficient documentation

## 2010-12-13 DIAGNOSIS — R079 Chest pain, unspecified: Secondary | ICD-10-CM

## 2010-12-13 DIAGNOSIS — M549 Dorsalgia, unspecified: Secondary | ICD-10-CM

## 2010-12-13 DIAGNOSIS — R071 Chest pain on breathing: Secondary | ICD-10-CM | POA: Insufficient documentation

## 2010-12-13 DIAGNOSIS — Z79899 Other long term (current) drug therapy: Secondary | ICD-10-CM | POA: Insufficient documentation

## 2010-12-13 DIAGNOSIS — Y92410 Unspecified street and highway as the place of occurrence of the external cause: Secondary | ICD-10-CM | POA: Insufficient documentation

## 2010-12-13 MED ORDER — OXYCODONE-ACETAMINOPHEN 5-325 MG PO TABS: 2.0000 | ORAL_TABLET | ORAL | Status: AC | PRN
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
## 2010-12-13 NOTE — ED Notes (Signed)
Upon entering room to discharge pt she stated that she needed a breathing treatment.  Pt with clear lung sounds bilaterally and she does have upper airway congestion and cough d/t postural drainage.  NP notified.
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
## 2010-12-15 ENCOUNTER — Ambulatory Visit: Payer: Medicaid Other

## 2010-12-15 ENCOUNTER — Ambulatory Visit: Payer: Medicaid Other | Admitting: Internal Medicine

## 2010-12-21 ENCOUNTER — Other Ambulatory Visit: Payer: Self-pay | Admitting: Internal Medicine

## 2010-12-21 DIAGNOSIS — E119 Type 2 diabetes mellitus without complications: Secondary | ICD-10-CM

## 2010-12-22 ENCOUNTER — Ambulatory Visit: Payer: Medicaid Other | Attending: Family Medicine | Admitting: Rehabilitation

## 2010-12-22 DIAGNOSIS — M25519 Pain in unspecified shoulder: Secondary | ICD-10-CM | POA: Insufficient documentation

## 2010-12-22 DIAGNOSIS — IMO0001 Reserved for inherently not codable concepts without codable children: Secondary | ICD-10-CM | POA: Insufficient documentation

## 2010-12-23 ENCOUNTER — Ambulatory Visit: Payer: Medicaid Other | Admitting: Internal Medicine

## 2010-12-24 ENCOUNTER — Ambulatory Visit: Payer: Medicaid Other | Admitting: Advanced Practice Midwife

## 2010-12-29 ENCOUNTER — Ambulatory Visit: Payer: Medicaid Other | Attending: Family Medicine | Admitting: Physical Therapy

## 2010-12-29 DIAGNOSIS — M25519 Pain in unspecified shoulder: Secondary | ICD-10-CM | POA: Insufficient documentation

## 2010-12-29 DIAGNOSIS — IMO0001 Reserved for inherently not codable concepts without codable children: Secondary | ICD-10-CM | POA: Insufficient documentation

## 2010-12-31 ENCOUNTER — Ambulatory Visit (INDEPENDENT_AMBULATORY_CARE_PROVIDER_SITE_OTHER): Payer: Medicaid Other | Admitting: Family Medicine

## 2010-12-31 VITALS — BP 136/76

## 2010-12-31 DIAGNOSIS — M67919 Unspecified disorder of synovium and tendon, unspecified shoulder: Secondary | ICD-10-CM

## 2010-12-31 DIAGNOSIS — M7580 Other shoulder lesions, unspecified shoulder: Secondary | ICD-10-CM

## 2011-01-04 ENCOUNTER — Encounter: Payer: Medicaid Other | Admitting: Internal Medicine

## 2011-01-04 NOTE — Progress Notes (Signed)
  Subjective:    Patient ID: Ana Long, female    DOB: [birth year], 35 y.o.   MRN: [id number]  HPI  Very pleasant patient follows up about her LEFT shoulder pain. Felt to be rotator cuff tendinopathy and some impingement. She's been going to physical therapy, she is mostly better at this point. Approximately 75% better. Still has some pain with abduction with internal range of motion, but it is  Significantly improved. She has been quite compliant with her home exercise program.  Review of Systems REVIEW OF SYSTEMS  GEN: No fevers, chills. Nontoxic. Primarily MSK c/o today. MSK: Detailed in the HPI GI: tolerating PO intake without difficulty Neuro: No numbness, parasthesias, or tingling associated. Otherwise the pertinent positives of the ROS are noted above.      Objective:   Physical Exam   Physical Exam  Blood pressure 136/76.  GEN: Well-developed,well-nourished,in no acute distress; alert,appropriate and cooperative throughout examination HEENT: Normocephalic and atraumatic without obvious abnormalities. Ears, externally no deformities PULM: Breathing comfortably in no respiratory distress EXT: No clubbing, cyanosis, or edema PSYCH: Normally interactive. Cooperative during the interview. Pleasant. Friendly and conversant. Not anxious or depressed appearing. Normal, full affect.  Shoulder: L Inspection: No muscle wasting or winging Ecchymosis/edema: neg  AC joint, scapula, clavicle: NT Cervical spine: NT, full ROM Spurling's: neg Abduction: full, 5/5 Flexion: full, 5/5 IR, full, lift-off: 5/5 ER at neutral: full, 5/5 AC crossover: neg Neer: neg Hawkins: minimal pos Drop Test: neg Empty Can: neg Supraspinatus insertion: mild-mod T Bicipital groove: NT Speed's: neg Yergason's: neg Sulcus sign: neg Scapular dyskinesis: none C5-T1 intact  Neuro: Sensation intact Grip 5/5       Assessment & Plan:   1. Rotator cuff tendinitis     Much improved on  the LEFT. Continue with physical therapy and home exercise program. I'll recheck her in 2 months to make sure that she is improved fully.

## 2011-01-05 ENCOUNTER — Other Ambulatory Visit: Payer: Self-pay | Admitting: Internal Medicine

## 2011-01-05 ENCOUNTER — Ambulatory Visit: Payer: Medicaid Other | Admitting: Physical Therapy

## 2011-01-06 ENCOUNTER — Ambulatory Visit: Payer: Medicaid Other | Admitting: Physical Therapy

## 2011-01-11 ENCOUNTER — Encounter: Payer: Medicaid Other | Admitting: Physical Therapy

## 2011-01-13 ENCOUNTER — Ambulatory Visit: Payer: Medicaid Other | Admitting: Physician Assistant

## 2011-01-18 ENCOUNTER — Encounter: Payer: Medicaid Other | Admitting: Internal Medicine

## 2011-01-23 ENCOUNTER — Other Ambulatory Visit: Payer: Self-pay | Admitting: Internal Medicine

## 2011-02-05 ENCOUNTER — Other Ambulatory Visit: Payer: Self-pay | Admitting: Internal Medicine

## 2011-02-17 ENCOUNTER — Ambulatory Visit: Payer: Medicaid Other | Admitting: Obstetrics and Gynecology

## 2011-02-18 ENCOUNTER — Telehealth: Payer: Self-pay | Admitting: *Deleted

## 2011-02-18 NOTE — Telephone Encounter (Signed)
Pt called stating she is having rt leg pain from her knee down to toes, feels like muscle spasms.  Asking for refill on skelaxin or pain med.  Advised we have no doctor's in clinic until 02/24/11.  She is going out of town 02/24/11 to [place].  Suggested that she go to urgent care to be seen as she needs evaluation before rx's can be given.  Pt agreeable.  Asked her to call back to schedule f/u with Dr. Patsy Lager.

## 2011-02-22 NOTE — Telephone Encounter (Signed)
Ana Long, i am ok refilling her skelaxin - i know her well. Ok to refill with #60, 1 refill

## 2011-02-24 ENCOUNTER — Other Ambulatory Visit: Payer: Self-pay | Admitting: *Deleted

## 2011-02-24 MED ORDER — METAXALONE 800 MG PO TABS: 800.0000 mg | ORAL_TABLET | Freq: Three times a day (TID) | ORAL | Status: DC | PRN

## 2011-02-24 NOTE — Telephone Encounter (Signed)
Refill sent. Pt notified.

## 2011-02-24 NOTE — Progress Notes (Signed)
Refilled per Dr. Patsy Lager.  Pt notified that refill was sent to pharmacy.

## 2011-03-01 ENCOUNTER — Encounter: Payer: Medicaid Other | Admitting: Internal Medicine

## 2011-03-10 ENCOUNTER — Telehealth: Payer: Self-pay | Admitting: *Deleted

## 2011-03-10 NOTE — Telephone Encounter (Signed)
Recvd prior auth request for metaxalone that Dr. Patsy Lager rx'd for patient.  This is not on medicaid formulary.  Pt must have tried and failed 2 of the following meds or have contraindications to them to get the metaxalone appvd: tizandine (tablets not caps) Baclofen Cyclobenzaprine Methocarbamol  Robaxin  Dr. Patsy Lager, would you switch her to one of the above meds, and if not provide prior auth info. Thanks!

## 2011-03-10 NOTE — Telephone Encounter (Signed)
i would use tizanidine tab 4 mg po tid prn muscle spasm, #60, 5 refills  Can make sleepy - take mostly night Let her know will be way cheaper and just as good  thanks

## 2011-03-11 ENCOUNTER — Other Ambulatory Visit: Payer: Self-pay | Admitting: *Deleted

## 2011-03-11 MED ORDER — TIZANIDINE HCL 4 MG PO TABS: 4.0000 mg | ORAL_TABLET | Freq: Three times a day (TID) | ORAL | Status: AC | PRN

## 2011-03-11 NOTE — Telephone Encounter (Signed)
Tizanidine rx sent to [facility] on [address].  Pt notified that rx was sent.

## 2011-03-12 ENCOUNTER — Encounter: Payer: Medicaid Other | Admitting: Internal Medicine

## 2011-04-01 ENCOUNTER — Encounter: Payer: Medicaid Other | Admitting: Internal Medicine

## 2011-04-02 ENCOUNTER — Telehealth: Payer: Self-pay | Admitting: *Deleted

## 2011-04-02 MED ORDER — ACYCLOVIR 400 MG PO TABS: ORAL_TABLET | ORAL | Status: DC

## 2011-04-02 MED ORDER — FLUCONAZOLE 150 MG PO TABS: 150.0000 mg | ORAL_TABLET | Freq: Once | ORAL | Status: AC

## 2011-04-02 NOTE — Telephone Encounter (Signed)
Pt left message stating that she would like a refill on her diflucan and acyclovir.  Rx authorized by Dr. Marice Potter.  Spoke with pt and verified correct pharmacy. Pt also was informed that she is due for her annual Gyn exam and will need Pap as well- last Pap was 03/2008.  Pt voiced understanding and call was transferred for appt scheduling.

## 2011-04-03 ENCOUNTER — Other Ambulatory Visit: Payer: Self-pay | Admitting: Internal Medicine

## 2011-04-05 NOTE — Telephone Encounter (Signed)
Patient need an appointment as soon as possible.

## 2011-04-09 ENCOUNTER — Encounter: Payer: Medicaid Other | Admitting: Internal Medicine

## 2011-04-20 ENCOUNTER — Ambulatory Visit: Payer: Medicaid Other | Admitting: Family Medicine

## 2011-04-26 ENCOUNTER — Ambulatory Visit (INDEPENDENT_AMBULATORY_CARE_PROVIDER_SITE_OTHER): Payer: Medicaid Other | Admitting: Advanced Practice Midwife

## 2011-04-26 ENCOUNTER — Other Ambulatory Visit (HOSPITAL_COMMUNITY)
Admission: RE | Admit: 2011-04-26 | Discharge: 2011-04-26 | Disposition: A | Discharge status: Home / Self Care | Payer: Medicaid Other | Source: Ambulatory Visit | Attending: Advanced Practice Midwife | Admitting: Advanced Practice Midwife

## 2011-04-26 ENCOUNTER — Encounter: Payer: Self-pay | Admitting: Advanced Practice Midwife

## 2011-04-26 VITALS — BP 153/96 | HR 93 | Temp 97.2°F | Ht 62.0 in | Wt 307.1 lb

## 2011-04-26 DIAGNOSIS — B49 Unspecified mycosis: Secondary | ICD-10-CM

## 2011-04-26 DIAGNOSIS — Z01419 Encounter for gynecological examination (general) (routine) without abnormal findings: Secondary | ICD-10-CM

## 2011-04-26 DIAGNOSIS — B379 Candidiasis, unspecified: Secondary | ICD-10-CM

## 2011-04-26 DIAGNOSIS — I10 Essential (primary) hypertension: Secondary | ICD-10-CM

## 2011-04-26 DIAGNOSIS — Z113 Encounter for screening for infections with a predominantly sexual mode of transmission: Secondary | ICD-10-CM | POA: Insufficient documentation

## 2011-04-26 MED ORDER — FLUCONAZOLE 150 MG PO TABS: 150.0000 mg | ORAL_TABLET | Freq: Once | ORAL | Status: AC

## 2011-04-26 NOTE — Patient Instructions (Signed)

## 2011-04-26 NOTE — Progress Notes (Signed)
  Subjective:     Ana Long is a 36 y.o. female here for a routine exam.  Current complaints: occasional yeast infections.  Personal health questionnaire reviewed: no.   Gynecologic History Patient's last menstrual period was 03/12/2011. Had been regular monthly for the past year  Contraception: none Last Pap: 2010. Results were: normal Last mammogram:  . Results were:   Obstetric History OB History    Grav Para Term Preterm Abortions TAB SAB Ect Mult Living                   The following portions of the patient's history were reviewed and updated as appropriate: allergies, current medications, past family history, past medical history, past social history, past surgical history and problem list.  Review of Systems Pertinent items are noted in HPI.    Objective:    General appearance: alert, cooperative, no distress and morbidly obese Back: symmetric, no curvature. ROM normal. No CVA tenderness. Lungs: clear to auscultation bilaterally Breasts: normal appearance, no masses or tenderness Heart: regular rate and rhythm, S1, S2 normal, no murmur, click, rub or gallop Abdomen: soft, non-tender; bowel sounds normal; no masses,  no organomegaly Pelvic: cervix normal in appearance, exam obscured by obesity, external genitalia normal, no adnexal masses or tenderness, no cervical motion tenderness, rectovaginal septum normal, uterus normal size, shape, and consistency and vagina normal without discharge    Assessment:    Healthy female exam.  PCOS Morbid Obesity Multiple medical issues   Plan:  Wants HIV, GC/Chl, and preg test..all done Diflucan PRN Rx given  Follow up in: 1 year.

## 2011-04-27 ENCOUNTER — Ambulatory Visit (INDEPENDENT_AMBULATORY_CARE_PROVIDER_SITE_OTHER): Payer: Medicaid Other | Admitting: Family Medicine

## 2011-04-27 ENCOUNTER — Encounter: Payer: Self-pay | Admitting: Family Medicine

## 2011-04-27 DIAGNOSIS — M7502 Adhesive capsulitis of left shoulder: Secondary | ICD-10-CM

## 2011-04-27 DIAGNOSIS — G5600 Carpal tunnel syndrome, unspecified upper limb: Secondary | ICD-10-CM

## 2011-04-27 DIAGNOSIS — M7500 Adhesive capsulitis of unspecified shoulder: Secondary | ICD-10-CM

## 2011-04-27 LAB — HIV ANTIBODY (ROUTINE TESTING W REFLEX): HIV: NONREACTIVE

## 2011-04-27 NOTE — Progress Notes (Signed)
  Patient Name: Ana Long Date of Birth: 11-Sep-1975 Age: 36 y.o. Medical Record Number: [id number] Gender: female Date of Encounter: 04/27/2011  History of Present Illness:  Ana Long is a 36 y.o. very pleasant female patient who presents with the following:  L shoulder: some pain, restricted ROM compared to last OV. Some dull ache.  B knee pain cont, R > L  B CTS. Median nerve distribution. R > L  Past Medical History, Surgical History, Social History, Family History, Problem List, Medications, and Allergies have been reviewed and updated if relevant.  Review of Systems:  GEN: No fevers, chills. Nontoxic. Primarily MSK c/o today. MSK: Detailed in the HPI GI: tolerating PO intake without difficulty Neuro: detailed above Otherwise the pertinent positives of the ROS are noted above.   Physical Examination: Filed Vitals:   04/27/11 1451  BP: 106/75  Pulse: 105    There is no height or weight on file to calculate BMI.   GEN: WDWN, NAD, Non-toxic, Alert & Oriented x 3 HEENT: Atraumatic, Normocephalic.  Ears and Nose: No external deformity. EXTR: No clubbing/cyanosis/edema NEURO: Normal gait.  PSYCH: Normally interactive. Conversant. Not depressed or anxious appearing.  Calm demeanor.   Shoulder: L Inspection: No muscle wasting or winging Ecchymosis/edema: neg  AC joint, scapula, clavicle: NT Cervical spine: NT, full ROM Spurling's: neg Abduction: lack of 10 deg, 4+/5 Flexion: full, 5/5 IR, at 90, loss of 50 deg. 5/5 ER at neutral: at 90 deg abd, loss of 25 deg er. 5/5 Speed's: neg Yergason's: neg Sulcus sign: neg Scapular dyskinesis: none C5-T1 intact Sensation intact Grip 5/5   Assessment and Plan: 1. Carpal tunnel syndrome   2. Adhesive capsulitis of left shoulder     [facility] frozen shoulder protocol  CTS Injection, US guided, R Verbal consent was obtained. Risks (including rare risk of infection), benefits, and alternatives  were discussed. Prepped with Chloraprep and Ethyl Chloride used for anesthesia. Under sterile conditions,  the patient was injected in Long axis with the median nerve in excellent view and flattened. Aspiration showed no blood. Medication flowed freely without resistance. Injected adjacent to median nerve and hydrodissected around it. Needle size: 22 gauge 1 1/2 inch Injection: 1/2 cc of Lidocaine 1% and 1/2 cc of Depo-Medrol 40 mg

## 2011-05-04 ENCOUNTER — Encounter: Payer: Self-pay | Admitting: Advanced Practice Midwife

## 2011-06-15 ENCOUNTER — Other Ambulatory Visit: Payer: Self-pay | Admitting: Internal Medicine

## 2011-06-18 ENCOUNTER — Other Ambulatory Visit: Payer: Self-pay | Admitting: *Deleted

## 2011-06-18 DIAGNOSIS — E119 Type 2 diabetes mellitus without complications: Secondary | ICD-10-CM

## 2011-06-18 MED ORDER — "INSULIN SYRINGE/NEEDLE 28G X 1/2"" 1 ML MISC": Status: DC

## 2011-06-22 ENCOUNTER — Ambulatory Visit (INDEPENDENT_AMBULATORY_CARE_PROVIDER_SITE_OTHER): Payer: Medicaid Other | Admitting: Family Medicine

## 2011-06-22 VITALS — BP 134/81

## 2011-06-22 DIAGNOSIS — M25519 Pain in unspecified shoulder: Secondary | ICD-10-CM

## 2011-06-22 DIAGNOSIS — M25569 Pain in unspecified knee: Secondary | ICD-10-CM

## 2011-06-22 DIAGNOSIS — M1710 Unilateral primary osteoarthritis, unspecified knee: Secondary | ICD-10-CM

## 2011-06-22 NOTE — Progress Notes (Signed)
  Patient Name: Ana Long Date of Birth: Jun 07, 1975 Age: 36 y.o. Medical Record Number: [id number] Gender: female Date of Encounter: 06/22/2011  History of Present Illness:  Ana Long is a 36 y.o. very pleasant female patient who presents with the following:  Pt known well, f/u knee pain, morbid obesity.  R knee, continues to have significant knee pain. Not using cane, no locking up of the joint, had some significant osteophyte formation on last radiographic evaluation. Knee pain has returned - has been worst in the past, but currently is flared up.  L > R shoulder are hurting, has been compliant with HEP for frozen shoulder, ROM has improved compared to the last office visit. Dull ache continues.  Past Medical History, Surgical History, Social History, Family History, Problem List, Medications, and Allergies have been reviewed and updated if relevant.  Review of Systems:  GEN: No fevers, chills. Nontoxic. Primarily MSK c/o today. MSK: Detailed in the HPI GI: tolerating PO intake without difficulty Neuro: No numbness, parasthesias, or tingling associated. Otherwise the pertinent positives of the ROS are noted above.    Physical Examination: Filed Vitals:   06/22/11 1424  BP: 134/81    There is no height or weight on file to calculate BMI.   GEN: WDWN, NAD, Non-toxic, Alert & Oriented x 3 HEENT: Atraumatic, Normocephalic.  Ears and Nose: No external deformity. EXTR: No clubbing/cyanosis/edema NEURO: Normal gait.  PSYCH: Normally interactive. Conversant. Not depressed or anxious appearing.  Calm demeanor.   Shoulder: B Inspection: No muscle wasting or winging Ecchymosis/edema: neg  AC joint, scapula, clavicle: mild TTP ac joint Cervical spine: NT, full ROM Spurling's: neg Abduction: full, 5/5, mildly tender arc of motion Flexion: full, 5/5 IR, full, lift-off: 5/5 ER at neutral: full, 5/5 AC crossover and compression: neg Neer: neg Hawkins:  neg Drop Test: neg Empty Can: neg Supraspinatus insertion: NT Bicipital groove: NT Speed's: neg Yergason's: neg Sulcus sign: neg Scapular dyskinesis: none C5-T1 intact Sensation intact Grip 5/5  B knees. Full ext, flexion to 115. TTP medial joint line, R > L. mcmurrays causes pain. Ant and post drawer intact. MCL and LCL intact. No effusion  Assessment and Plan:  1. Knee pain   2. Osteoarthrosis, unspecified whether generalized or localized, lower leg   3. Shoulder pain    R OA knee flare, inject with steroids today Discussed getting up follow-up studies, may check MRI at f/u to eval for meniscal pathology  Cont ROM program for shoulders, frozen shoulder improving a great deal  Knee Injection, R Patient verbally consented to procedure. Risks (including potential rare risk of infection), benefits, and alternatives explained. Sterilely prepped with Chloraprep. Ethyl cholride used for anesthesia. 9 cc Lidocaine 1% mixed with 1 cc of Depo-Medrol 40 mg injected using the anterolateral approach without difficulty. No complications with procedure and tolerated well. Patient had decreased pain post-injection.   Orders Today: No orders of the defined types were placed in this encounter.    Medications Today: No orders of the defined types were placed in this encounter.

## 2011-07-06 ENCOUNTER — Ambulatory Visit (INDEPENDENT_AMBULATORY_CARE_PROVIDER_SITE_OTHER): Payer: Self-pay | Admitting: Ophthalmology

## 2011-07-06 VITALS — BP 130/86 | HR 90 | Temp 97.8°F | Resp 20 | Ht 62.25 in | Wt 307.5 lb

## 2011-07-06 DIAGNOSIS — I10 Essential (primary) hypertension: Secondary | ICD-10-CM

## 2011-07-06 DIAGNOSIS — K219 Gastro-esophageal reflux disease without esophagitis: Secondary | ICD-10-CM

## 2011-07-06 DIAGNOSIS — Z79899 Other long term (current) drug therapy: Secondary | ICD-10-CM

## 2011-07-06 DIAGNOSIS — E119 Type 2 diabetes mellitus without complications: Secondary | ICD-10-CM

## 2011-07-06 DIAGNOSIS — J302 Other seasonal allergic rhinitis: Secondary | ICD-10-CM

## 2011-07-06 DIAGNOSIS — G4710 Hypersomnia, unspecified: Secondary | ICD-10-CM

## 2011-07-06 DIAGNOSIS — J45909 Unspecified asthma, uncomplicated: Secondary | ICD-10-CM

## 2011-07-06 LAB — LIPID PANEL
Cholesterol: 185 mg/dL (ref 0–200)
HDL: 44 mg/dL (ref >39)
LDL Cholesterol: 104 mg/dL — ABNORMAL HIGH (ref 0–99)
Triglycerides: 183 mg/dL — ABNORMAL HIGH (ref <150)
VLDL: 37 mg/dL (ref 0–40)

## 2011-07-06 LAB — POCT GLYCOSYLATED HEMOGLOBIN (HGB A1C): Hemoglobin A1C: 11.7

## 2011-07-06 LAB — GLUCOSE, CAPILLARY: Glucose-Capillary: 320 mg/dL — ABNORMAL HIGH (ref 70–99)

## 2011-07-06 MED ORDER — CETIRIZINE HCL 10 MG PO CAPS: 10.0000 mg | ORAL_CAPSULE | Freq: Every day | ORAL | Status: DC

## 2011-07-06 NOTE — Progress Notes (Signed)
Subjective:   Patient ID: Ana Long female   DOB: 06/13/1975 36 y.o.   MRN: [id number]  HPI: Ana Long is a 36 y.o. woman with DM and asthma  DM- hasn't been checking sugars- had a few episodes (about 3) since March when she started feeling weird, like she was in trouble, hadn't eaten for 4-5 hours. Was out of the house so she didn't check sugars. Is trying to make an effort to eat 3 meals a day and eat more healthily avoiding fries, and soda. Started SENSA in March. HgA1c was 10.9 last 1 year ago. Lost 8 lbs since October. Not doing physical activity due to pain in knee and getting injections from sports medicine.   Borderline HTN- not on any BP pills, doesn't want to take pills  OSA- during the week doesn't use it, uses it on the weekends or if she takes a nap. Her son has to catch the bus and she doesn't wake up to her alarm. Is taking 3 hour naps with CPAP regularly. Has several different alarms with different noises.  Allergy- sounds stuffy. Nose was very itchy. Crusty eyes.  Asthma- was only using albuterol inhaler 3 times a week, is trying not to use it. Gets congested and cough at night.   GERD- is improved after losing some weight  Past Medical History  Diagnosis Date  . GERD (gastroesophageal reflux disease)   . Asthma   . Diabetes mellitus   . Lower back pain   . Hyperlipidemia   . Sleep apnea, obstructive     CPAP at  night  . Bilateral carpal tunnel syndrome   . Depression     intolerant to Metformin  . Herpes genital   . Allergy   . Allergic rhinitis 02/22/1998  . PCOS (polycystic ovarian syndrome)    Current Outpatient Prescriptions  Medication Sig Dispense Refill  . glipiZIDE (GLUCOTROL) 5 MG tablet TAKE ONE TABLET BY MOUTH TWICE DAILY  60 tablet  0  . LANTUS 100 UNIT/ML injection INJECT 92 UNITS INTO THE SKIN OF THE ABDOMEN AT BEDTIME  30 mL  3  . acyclovir (ZOVIRAX) 400 MG tablet Take 1 tablet by mouth twice daily  60 tablet  2  .  albuterol (PROVENTIL,VENTOLIN) 90 MCG/ACT inhaler Inhale 1-2 puffs into the lungs every 4 (four) hours as needed. For shortness of breath,wheezing or coughing.       . Condoms Non-Latex Non-Lubricat DEVI 1 Units by Does not apply route as needed.  12 each  0  . Diclofenac Epolamine (FLECTOR) 1.3 % PTCH Place 1 patch onto the skin 2 (two) times daily. On the affected joint       . famotidine (PEPCID) 40 MG tablet Take 1 tablet (40 mg total) by mouth daily.  30 tablet  3  . fluticasone (FLONASE) 50 MCG/ACT nasal spray 2 sprays by Nasal route daily.  60 Act  11  . Fluticasone-Salmeterol (ADVAIR DISKUS) 250-50 MCG/DOSE AEPB Inhale 1 puff into the lungs 2 (two) times daily.  60 each  3  . Fluticasone-Salmeterol (ADVAIR DISKUS) 500-50 MCG/DOSE AEPB Inhale 1 puff into the lungs 2 (two) times daily.        Marland Kitchen glucose blood (ACCU-CHEK AVIVA) test strip Use to check blood sugar up to 2 times daily  100 each  11  . ibuprofen (ADVIL,MOTRIN) 200 MG tablet Take 800 mg by mouth 3 (three) times daily as needed. For pain       . INS SYRINGE/NEEDLE 1CC/28G (  PRODIGY INSULIN SYRINGE) 28G X 1/2" 1 ML MISC Use as precribed.  100 each  3  . lactulose (CHRONULAC) 10 GM/15ML solution Take 15ml by mouth once or twice daily as needed for constipation       . Lancets (ACCU-CHEK MULTICLIX) lancets Use to check blood sugar up to 2 times daily  100 each  11  . metaxalone (SKELAXIN) 800 MG tablet Take 1 tablet (800 mg total) by mouth 3 (three) times daily as needed.  60 tablet  1  . pravastatin (PRAVACHOL) 40 MG tablet Take 1 tablet (40 mg total) by mouth at bedtime.  30 tablet  11  . TraMADol HCl 50 MG TBSO Take 50-100 mg by mouth every 8 (eight) hours as needed. for pain      . Wheat Dextrin (BENEFIBER) POWD Add 1 scoop to beverage of choice per day       . DISCONTD: albuterol (PROVENTIL,VENTOLIN) 90 MCG/ACT inhaler Inhale 1-2 puffs into the lungs every 4 (four) hours as needed. For SOB,wheezing or coughing.  17 g  11   No  family history on file. History   Social History  . Marital Status: Divorced    Spouse Name: N/A    Number of Children: 2  . Years of Education: N/A   Occupational History  . UNEMPLOYED    Social History Main Topics  . Smoking status: Former Smoker -- 0.2 packs/day for 10 years    Types: Cigarettes    Quit date: 05/24/2010  . Smokeless tobacco: Not on file  . Alcohol Use: No  . Drug Use: No  . Sexually Active: Yes    Birth Control/ Protection: None     Lives w/a partner   Other Topics Concern  . Not on file   Social History Narrative   Single, but lives with a partnerHas 2 children and cares for an infant during the day   Objective:  Physical Exam: Filed Vitals:   07/06/11 1503  BP: 139/94  Pulse: 92  Temp: 97.8 F (36.6 C)  TempSrc: Oral  Resp: 20  Height: 5' 2.25" (1.581 m)  Weight: 307 lb 8 oz (139.481 kg)   General: congested sounding obese woman sitting in chair in no acute distress HEENT: PERRL, EOMI, no scleral icterus, sinus tenderness Pulm: clear to auscultation bilaterally, moving normal volumes of air, no wheeze or crackles Ext: warm and well perfused, no pedal edema Neuro: alert and oriented X3, cranial nerves II-XII grossly intact  Assessment & Plan:

## 2011-07-06 NOTE — Assessment & Plan Note (Signed)
Patient is obviously sleep deprived and she drifts off several times today during her visit. She will need to figure out a way to use CPAP at night and still be able to get up in the morning. I wonder if once she gets over her initial sleep deprivation she wont have such a hard time waking up.

## 2011-07-06 NOTE — Assessment & Plan Note (Signed)
Patient seems relatively stable, advised her that controlling her acid reflux and allergy symptoms will cause her to have less coughing and wheezing. Asked her to start Zyrtec daily which she has been on before. Ranitidine as needed.

## 2011-07-06 NOTE — Assessment & Plan Note (Signed)
Patient is not on any treatment for hypertension and on recheck her pressure cam down just into normal range. Will continue to monitor. Patient may need blood pressure medication although her blood pressure was low as recently as March.

## 2011-07-06 NOTE — Assessment & Plan Note (Signed)
Patient had gotten off of famotidine since she ran out of it. I advised her that she could get ranitidine quite inexpensively at [facility], 200 pills for less than $10. I told her that she does not need to take it everyday, only when she is having symptoms of GERD.

## 2011-07-06 NOTE — Patient Instructions (Addendum)
-  Can get RANITIDINE over the counter at [facility] for acid prevention -Take ZYRTEC daily 10mg  - Please check blood sugar 30 minutes before and after breakfast, lunch and dinner for 2 days prior to next visit so we can change your insulin dose appropriately -can STOP glipizide

## 2011-07-06 NOTE — Assessment & Plan Note (Signed)
Patient's A1c is 11.7 today which is worse than 10.9 one year ago. She is trying to eat healthier and use weight loss product with mild weight loss. Asked her to check sugars before and after each meal for 2 days prior to next appt with PCP, Dr. Loistine Chance. Since patient is having hypoglycemic episodes and is on such a high dose of lantus, I doubt that the glipizide is helping her much. Told her she could stop this until she sees you next. She may benefit from switching to 70/30 BID. Asked her to see [facility] as well to help with her weight loss goals.

## 2011-07-07 LAB — MICROALBUMIN / CREATININE URINE RATIO
Microalb Creat Ratio: 17.2 mg/g (ref 0.0–30.0)
Microalb, Ur: 2.11 mg/dL — ABNORMAL HIGH (ref 0.00–1.89)

## 2011-08-03 ENCOUNTER — Ambulatory Visit: Payer: Self-pay | Admitting: Family Medicine

## 2011-09-27 ENCOUNTER — Encounter: Payer: Self-pay | Admitting: *Deleted

## 2011-09-29 ENCOUNTER — Ambulatory Visit: Payer: Self-pay | Admitting: Sports Medicine

## 2011-10-05 ENCOUNTER — Encounter: Payer: Self-pay | Admitting: Internal Medicine

## 2011-11-21 ENCOUNTER — Other Ambulatory Visit: Payer: Self-pay | Admitting: Obstetrics & Gynecology

## 2011-12-15 ENCOUNTER — Inpatient Hospital Stay (HOSPITAL_COMMUNITY)
Admission: AD | Admit: 2011-12-15 | Discharge: 2011-12-16 | DRG: 759 | Disposition: A | Discharge status: Home / Self Care | Payer: Medicaid Other | Source: Ambulatory Visit | Attending: Obstetrics and Gynecology | Admitting: Obstetrics and Gynecology

## 2011-12-15 ENCOUNTER — Encounter (HOSPITAL_COMMUNITY): Payer: Self-pay | Admitting: *Deleted

## 2011-12-15 DIAGNOSIS — B373 Candidiasis of vulva and vagina: Principal | ICD-10-CM | POA: Diagnosis present

## 2011-12-15 DIAGNOSIS — B3731 Acute candidiasis of vulva and vagina: Principal | ICD-10-CM | POA: Diagnosis present

## 2011-12-15 DIAGNOSIS — E119 Type 2 diabetes mellitus without complications: Secondary | ICD-10-CM | POA: Diagnosis present

## 2011-12-15 LAB — WET PREP, GENITAL
Clue Cells Wet Prep HPF POC: NONE SEEN
Trich, Wet Prep: NONE SEEN
Yeast Wet Prep HPF POC: NONE SEEN

## 2011-12-15 LAB — GLUCOSE, CAPILLARY: Glucose-Capillary: 278 mg/dL — ABNORMAL HIGH (ref 70–99)

## 2011-12-15 LAB — URINALYSIS, ROUTINE W REFLEX MICROSCOPIC
Hgb urine dipstick: NEGATIVE
Nitrite: NEGATIVE
Specific Gravity, Urine: 1.02 (ref 1.005–1.030)
Urobilinogen, UA: 0.2 mg/dL (ref 0.0–1.0)
pH: 6 (ref 5.0–8.0)

## 2011-12-15 LAB — URINE MICROSCOPIC-ADD ON

## 2011-12-15 MED ORDER — FLUCONAZOLE 150 MG PO TABS: 150.0000 mg | ORAL_TABLET | Freq: Once | ORAL | Status: DC

## 2011-12-15 NOTE — MAU Note (Signed)
Pt states she just got off vacation and used the hotel soap and is having an allergic reaction in the form of a yeast infection-took monistat on Tues-it was worse and is still pretty bad

## 2011-12-15 NOTE — MAU Provider Note (Signed)
CC: Vaginitis    First Provider Initiated Contact with Patient 12/15/11 2253      HPI Ana A McDougaldWard ia a 36 y.o. G0P0 who presents with 5 day history of vulvovaginal irritation and itching. She was on vacation using a new soap and had burning, redness and swelling of her labia starting a few days ago. Gets frequent yeast infections and thought she had yeast so tried Monistat which was ineffective; last used yesterday. Has not been checking sugars since she went out of town and was drinking a lot, not following diet. States she continued to take insulin as directed though.  LMP 12/08/11. Denies abnormal bleeding.   Past Medical History  Diagnosis Date  . GERD (gastroesophageal reflux disease)   . Asthma   . Diabetes mellitus   . Lower back pain   . Hyperlipidemia   . Sleep apnea, obstructive     CPAP at  night  . Bilateral carpal tunnel syndrome   . Depression     intolerant to Metformin  . Herpes genital   . Allergy   . Allergic rhinitis 02/22/1998  . PCOS (polycystic ovarian syndrome)     OB History    Grav Para Term Preterm Abortions TAB SAB Ect Mult Living   0               Past Surgical History  Procedure Date  . Dilation and curettage of uterus     History   Social History  . Marital Status: Divorced    Spouse Name: N/A    Number of Children: 2  . Years of Education: N/A   Occupational History  . UNEMPLOYED    Social History Main Topics  . Smoking status: Former Smoker -- 0.2 packs/day for 10 years    Types: Cigarettes    Quit date: 05/24/2010  . Smokeless tobacco: Not on file  . Alcohol Use: No  . Drug Use: No  . Sexually Active: Yes    Birth Control/ Protection: None     Lives w/a partner   Other Topics Concern  . Not on file   Social History Narrative   Single, but lives with a partnerHas 2 children and cares for an infant during the day    No current facility-administered medications on file prior to encounter.   Current Outpatient  Prescriptions on File Prior to Encounter  Medication Sig Dispense Refill  . acyclovir (ZOVIRAX) 400 MG tablet TAKE ONE TABLET BY MOUTH TWICE DAILY  60 tablet  1  . Cetirizine HCl 10 MG CAPS Take 1 capsule (10 mg total) by mouth daily.  60 capsule  9  . Diclofenac Epolamine (FLECTOR) 1.3 % PTCH Place 1 patch onto the skin 2 (two) times daily. On the affected joint       . Fluticasone-Salmeterol (ADVAIR DISKUS) 500-50 MCG/DOSE AEPB Inhale 1 puff into the lungs 2 (two) times daily.        Marland Kitchen ibuprofen (ADVIL,MOTRIN) 200 MG tablet Take 800 mg by mouth 3 (three) times daily as needed. For pain       . lactulose (CHRONULAC) 10 GM/15ML solution Take 15ml by mouth once or twice daily as needed for constipation       . LANTUS 100 UNIT/ML injection INJECT 92 UNITS INTO THE SKIN OF THE ABDOMEN AT BEDTIME  30 mL  3  . TraMADol HCl 50 MG TBSO Take 50-100 mg by mouth every 8 (eight) hours as needed. for pain      .  Wheat Dextrin (BENEFIBER) POWD Add 1 scoop to beverage of choice per day       . albuterol (PROVENTIL,VENTOLIN) 90 MCG/ACT inhaler Inhale 1-2 puffs into the lungs every 4 (four) hours as needed. For shortness of breath,wheezing or coughing.       . Condoms Non-Latex Non-Lubricat DEVI 1 Units by Does not apply route as needed.  12 each  0  . famotidine (PEPCID) 40 MG tablet Take 1 tablet (40 mg total) by mouth daily.  30 tablet  3  . fluticasone (FLONASE) 50 MCG/ACT nasal spray 2 sprays by Nasal route daily.  60 Act  11  . Fluticasone-Salmeterol (ADVAIR DISKUS) 250-50 MCG/DOSE AEPB Inhale 1 puff into the lungs 2 (two) times daily.  60 each  3  . glipiZIDE (GLUCOTROL) 5 MG tablet TAKE ONE TABLET BY MOUTH TWICE DAILY  60 tablet  0  . glucose blood (ACCU-CHEK AVIVA) test strip Use to check blood sugar up to 2 times daily  100 each  11  . INS SYRINGE/NEEDLE 1CC/28G (PRODIGY INSULIN SYRINGE) 28G X 1/2" 1 ML MISC Use as precribed.  100 each  3  . Lancets (ACCU-CHEK MULTICLIX) lancets Use to check blood  sugar up to 2 times daily  100 each  11  . metaxalone (SKELAXIN) 800 MG tablet Take 1 tablet (800 mg total) by mouth 3 (three) times daily as needed.  60 tablet  1  . pravastatin (PRAVACHOL) 40 MG tablet Take 1 tablet (40 mg total) by mouth at bedtime.  30 tablet  11    Allergies  Allergen Reactions  . Doxycycline   . Metformin And Related   . Penicillins     ROS Pertinent items in HPI  PHYSICAL EXAM General: Morbidly obese female in no acute distress Cardiovascular: Normal rate Respiratory: Normal effort Abdomen: Soft, nontender Back: No CVAT Extremities: No edema Neurologic: Alert and oriented  Speculum exam: Vulva pale, not swollen or excoriated, vagina with large amount adherent white discharge. vagina with physiologic discharge, no blood; cervix clean Bimanual exam: cervix closed, no CMT; uterus and adnexae unable to outline due to body habitus  LAB RESULTS Results for orders placed during the hospital encounter of 12/15/11 (from the past 24 hour(s))  URINALYSIS, ROUTINE W REFLEX MICROSCOPIC     Status: Abnormal   Collection Time   12/15/11  9:50 PM      Component Value Range   Color, Urine YELLOW  YELLOW   APPearance CLEAR  CLEAR   Specific Gravity, Urine 1.020  1.005 - 1.030   pH 6.0  5.0 - 8.0   Glucose, UA >1000 (*) NEGATIVE mg/dL   Hgb urine dipstick NEGATIVE  NEGATIVE   Bilirubin Urine NEGATIVE  NEGATIVE   Ketones, ur 15 (*) NEGATIVE mg/dL   Protein, ur NEGATIVE  NEGATIVE mg/dL   Urobilinogen, UA 0.2  0.0 - 1.0 mg/dL   Nitrite NEGATIVE  NEGATIVE   Leukocytes, UA NEGATIVE  NEGATIVE  URINE MICROSCOPIC-ADD ON     Status: Abnormal   Collection Time   12/15/11  9:50 PM      Component Value Range   Squamous Epithelial / LPF MANY (*) RARE   WBC, UA 0-2  <3 WBC/hpf   Urine-Other MUCOUS PRESENT    WET PREP, GENITAL     Status: Abnormal   Collection Time   12/15/11 11:02 PM      Component Value Range   Yeast Wet Prep HPF POC NONE SEEN  NONE SEEN   Trich, Wet  Prep NONE SEEN  NONE SEEN   Clue Cells Wet Prep HPF POC NONE SEEN  NONE SEEN   WBC, Wet Prep HPF POC MODERATE (*) NONE SEEN  GLUCOSE, CAPILLARY     Status: Abnormal   Collection Time   12/15/11 11:58 PM      Component Value Range   Glucose-Capillary 278 (*) 70 - 99 mg/dL   Comment 1 Notify RN       ASSESSMENT 1. Yeast infection involving the vagina and surrounding area   2. Diabetes mellitus   Yeast by clinical exam (use of Monistat may preclude getting adequate specimen for [facility]). May be allergic component also.  Diabetes not controlled; noncompliant  PLAN GC/CT sent Discharge home. See AVS for patient education. Sitz and avoid irritants. Follow up [facility] for Diabetes managaement   Medication List     As of 12/15/2011 11:46 PM    TAKE these medications         accu-chek multiclix lancets   Use to check blood sugar up to 2 times daily      acyclovir 400 MG tablet   Commonly known as: ZOVIRAX   TAKE ONE TABLET BY MOUTH TWICE DAILY      ADVAIR DISKUS 500-50 MCG/DOSE Aepb   Generic drug: Fluticasone-Salmeterol   Inhale 1 puff into the lungs 2 (two) times daily.      Fluticasone-Salmeterol 250-50 MCG/DOSE Aepb   Commonly known as: ADVAIR   Inhale 1 puff into the lungs 2 (two) times daily.      albuterol 90 MCG/ACT inhaler   Commonly known as: PROVENTIL,VENTOLIN   Inhale 1-2 puffs into the lungs every 4 (four) hours as needed. For shortness of breath,wheezing or coughing.      Benefiber Powd   Add 1 scoop to beverage of choice per day      Cetirizine HCl 10 MG Caps   Take 1 capsule (10 mg total) by mouth daily.      Condoms Non-Latex Non-Lubricat Devi   1 Units by Does not apply route as needed.      famotidine 20 MG tablet   Commonly known as: PEPCID   Take 20 mg by mouth 2 (two) times daily.      famotidine 40 MG tablet   Commonly known as: PEPCID   Take 1 tablet (40 mg total) by mouth daily.      FLECTOR 1.3 % Ptch   Generic drug:  diclofenac   Place 1 patch onto the skin 2 (two) times daily. On the affected joint      fluconazole 150 MG tablet   Commonly known as: DIFLUCAN   Take 1 tablet (150 mg total) by mouth once.      fluticasone 50 MCG/ACT nasal spray   Commonly known as: FLONASE   2 sprays by Nasal route daily.      glipiZIDE 5 MG tablet   Commonly known as: GLUCOTROL   TAKE ONE TABLET BY MOUTH TWICE DAILY      glucose blood test strip   Use to check blood sugar up to 2 times daily      ibuprofen 200 MG tablet   Commonly known as: ADVIL,MOTRIN   Take 800 mg by mouth 3 (three) times daily as needed. For pain        INS SYRINGE/NEEDLE 1CC/28G 28G X 1/2" 1 ML Misc   Use as precribed.      lactulose 10 GM/15ML solution   Commonly known as: CHRONULAC  Take 15ml by mouth once or twice daily as needed for constipation      LANTUS 100 UNIT/ML injection   Generic drug: insulin glargine   INJECT 92 UNITS INTO THE SKIN OF THE ABDOMEN AT BEDTIME      metaxalone 800 MG tablet   Commonly known as: SKELAXIN   Take 1 tablet (800 mg total) by mouth 3 (three) times daily as needed.      pravastatin 40 MG tablet   Commonly known as: PRAVACHOL   Take 1 tablet (40 mg total) by mouth at bedtime.      TraMADol HCl 50 MG Tbso   Take 50-100 mg by mouth every 8 (eight) hours as needed. for pain          Danae Orleans, CNM 12/15/2011 10:53 PM

## 2011-12-16 LAB — GC/CHLAMYDIA PROBE AMP, GENITAL: GC Probe Amp, Genital: NEGATIVE

## 2011-12-16 MED ORDER — FLUCONAZOLE 150 MG PO TABS
150.0000 mg | ORAL_TABLET | Freq: Once | ORAL | Status: AC
  Administered 2011-12-16: 150 mg via ORAL
  Filled 2011-12-16: qty 1

## 2011-12-20 ENCOUNTER — Other Ambulatory Visit: Payer: Self-pay | Admitting: Internal Medicine

## 2011-12-20 NOTE — MAU Provider Note (Signed)
Attestation of Attending Supervision of Advanced Practitioner: Evaluation and management procedures were performed by the PA/NP/CNM/OB Fellow under my supervision/collaboration. Chart reviewed and agree with management and plan.  Keirstin Musil V 12/20/2011 9:09 AM

## 2011-12-27 ENCOUNTER — Encounter: Payer: Self-pay | Admitting: Internal Medicine

## 2011-12-27 NOTE — Progress Notes (Signed)
Patient ID: Ana Long, female   DOB: December 05, 1975, 36 y.o.   MRN: [id number]  This patient is a CHRONIC NO-SHOW PATIENT that has a history of HYPERTENSION.  Please make sure to address hypertension during her next clinic appointment, and intervene as appropriate.    Within the AVS, please incorporate the following smartphrase: .htntips   Pertinent Data: BP Readings from Last 3 Encounters:  12/16/11 135/94  07/06/11 130/86  06/22/11 134/81    BMI: Estimated Body mass index is 54.18 kg/(m^2) as calculated from the following:   Height as of 12/15/11: 5\' 2" (1.575 m).   Weight as of 12/15/11: 296 lb 4 oz(134.378 kg).  Smoking History: History  Smoking status  . Former Smoker -- 0.2 packs/day for 10 years  . Types: Cigarettes  . Quit date: 05/24/2010  Smokeless tobacco  . Not on file    Last Basic Metabolic Panel:    Component Value Date/Time   NA 137 10/31/2009 2014   K 3.8 10/31/2009 2014   CL 100 10/31/2009 2014   CO2 29 07/08/2009 0550   BUN 5* 10/31/2009 2014   CREATININE 0.5 10/31/2009 2014   GLUCOSE 235* 10/31/2009 2014   CALCIUM 9.3 07/08/2009 0550    Allergies: Allergies  Allergen Reactions  . Doxycycline   . Metformin And Related   . Penicillins

## 2012-01-04 ENCOUNTER — Encounter (HOSPITAL_COMMUNITY): Payer: Self-pay | Admitting: Emergency Medicine

## 2012-01-04 ENCOUNTER — Emergency Department (HOSPITAL_COMMUNITY)
Admission: EM | Admit: 2012-01-04 | Discharge: 2012-01-04 | Disposition: A | Discharge status: Home / Self Care | Payer: Medicaid Other | Source: Home / Self Care

## 2012-01-04 DIAGNOSIS — J40 Bronchitis, not specified as acute or chronic: Secondary | ICD-10-CM

## 2012-01-04 MED ORDER — AZITHROMYCIN 250 MG PO TABS: ORAL_TABLET | ORAL | Status: DC

## 2012-01-04 NOTE — ED Provider Notes (Signed)
History     CSN: [id number]  Arrival date & time 01/04/12  1933   None     Chief Complaint  Patient presents with  . Cough    (Consider location/radiation/quality/duration/timing/severity/associated sxs/prior treatment) Patient is a 36 y.o. female presenting with cough. The history is provided by the patient. No language interpreter was used.  Cough This is a new problem. Episode onset: 2 weeks. The problem occurs constantly. The cough is productive of sputum. There has been no fever. Associated symptoms include shortness of breath. She has tried nothing for the symptoms. Her past medical history is significant for asthma.    Past Medical History  Diagnosis Date  . GERD (gastroesophageal reflux disease)   . Asthma   . Diabetes mellitus   . Lower back pain   . Hyperlipidemia   . Sleep apnea, obstructive     CPAP at  night  . Bilateral carpal tunnel syndrome   . Depression     intolerant to Metformin  . Herpes genital   . Allergy   . Allergic rhinitis 02/22/1998  . PCOS (polycystic ovarian syndrome)     Past Surgical History  Procedure Date  . Dilation and curettage of uterus     Family History  Problem Relation Age of Onset  . Other Mother     History  Substance Use Topics  . Smoking status: Former Smoker -- 0.2 packs/day for 10 years    Types: Cigarettes    Quit date: 05/24/2010  . Smokeless tobacco: Not on file  . Alcohol Use: No    OB History    Grav Para Term Preterm Abortions TAB SAB Ect Mult Living   0               Review of Systems  Respiratory: Positive for cough and shortness of breath.   All other systems reviewed and are negative.    Allergies  Doxycycline; Metformin and related; and Penicillins  Home Medications   Current Outpatient Rx  Name  Route  Sig  Dispense  Refill  . ACYCLOVIR 400 MG PO TABS      TAKE ONE TABLET BY MOUTH TWICE DAILY   60 tablet   1   . ALBUTEROL 90 MCG/ACT IN AERS   Inhalation   Inhale 1-2 puffs  into the lungs every 4 (four) hours as needed. For shortness of breath,wheezing or coughing.          . AZITHROMYCIN 250 MG PO TABS      Two tablets 1st day then one a day days2-5   6 each   0   . CETIRIZINE HCL 10 MG PO CAPS   Oral   Take 1 capsule (10 mg total) by mouth daily.   60 capsule   9   . CONDOMS NON-LATEX NON-LUBRICAT DEVI   Does not apply   1 Units by Does not apply route as needed.   12 each   0   . DICLOFENAC EPOLAMINE 1.3 % TD PTCH   Transdermal   Place 1 patch onto the skin 2 (two) times daily. On the affected joint          . FAMOTIDINE 20 MG PO TABS   Oral   Take 20 mg by mouth 2 (two) times daily.         Marland Kitchen FAMOTIDINE 40 MG PO TABS   Oral   Take 1 tablet (40 mg total) by mouth daily.   30 tablet   3   .  FLUCONAZOLE 150 MG PO TABS   Oral   Take 1 tablet (150 mg total) by mouth once.   2 tablet   0   . FLUTICASONE PROPIONATE 50 MCG/ACT NA SUSP   Nasal   2 sprays by Nasal route daily.   60 Act   11   . FLUTICASONE-SALMETEROL 250-50 MCG/DOSE IN AEPB   Inhalation   Inhale 1 puff into the lungs 2 (two) times daily.   60 each   3   . FLUTICASONE-SALMETEROL 500-50 MCG/DOSE IN AEPB   Inhalation   Inhale 1 puff into the lungs 2 (two) times daily.           Marland Kitchen GLIPIZIDE 5 MG PO TABS      TAKE ONE TABLET BY MOUTH TWICE DAILY   60 tablet   0   . GLUCOSE BLOOD VI STRP      Use to check blood sugar up to 2 times daily   100 each   11   . IBUPROFEN 200 MG PO TABS   Oral   Take 800 mg by mouth 3 (three) times daily as needed. For pain          . INSULIN SYRINGE/NEEDLE 28G X 1/2" 1 ML MISC      Use as precribed.   100 each   3   . LACTULOSE 10 GM/15ML PO SOLN      Take 15ml by mouth once or twice daily as needed for constipation          . ACCU-CHEK MULTICLIX LANCETS MISC      Use to check blood sugar up to 2 times daily   100 each   11   . LANTUS 100 UNIT/ML [place] SOLN      INJECT 92 UNITS INTO THE ABDOMEN AT  BEDTIME   10 mL   10   . METAXALONE 800 MG PO TABS   Oral   Take 1 tablet (800 mg total) by mouth 3 (three) times daily as needed.   60 tablet   1   . PRAVASTATIN SODIUM 40 MG PO TABS   Oral   Take 1 tablet (40 mg total) by mouth at bedtime.   30 tablet   11   . TRAMADOL HCL 50 MG PO TBSO   Oral   Take 50-100 mg by mouth every 8 (eight) hours as needed. for pain         . BENEFIBER PO POWD      Add 1 scoop to beverage of choice per day            BP 133/72  Pulse 90  Temp 98.9 F (37.2 C) (Oral)  Resp 16  SpO2 99%  LMP 12/08/2011  Physical Exam  Nursing note and vitals reviewed. Constitutional: She is oriented to person, place, and time. She appears well-developed and well-nourished.  HENT:  Head: Normocephalic.  Right Ear: External ear normal.  Left Ear: External ear normal.  Mouth/Throat: Oropharynx is clear and moist.  Eyes: Pupils are equal, round, and reactive to light.  Neck: Normal range of motion.  Cardiovascular: Normal rate and normal heart sounds.   Pulmonary/Chest: Effort normal and breath sounds normal.  Abdominal: Soft. Bowel sounds are normal.  Musculoskeletal: Normal range of motion.  Neurological: She is alert and oriented to person, place, and time. She has normal reflexes.  Skin: Skin is warm.  Psychiatric: She has a normal mood and affect.    ED Course  Procedures (including  critical care time)  Labs Reviewed - No data to display No results found.   1. Bronchitis       MDM  zithromax        Lonia Skinner [place], [place] 01/04/12 2025

## 2012-01-04 NOTE — ED Notes (Signed)
Pt having a cough and increased congestion for about 2 weeks. Pt states she has a history of asthma and pneumonia and has been getting SOB

## 2012-01-05 NOTE — ED Provider Notes (Signed)
Medical screening examination/treatment/procedure(s) were performed by non-physician practitioner and as supervising physician I was immediately available for consultation/collaboration.  Raynald Blend, MD 01/05/12 [phone]

## 2012-01-07 ENCOUNTER — Encounter: Payer: Self-pay | Admitting: Internal Medicine

## 2012-01-07 DIAGNOSIS — G4730 Sleep apnea, unspecified: Secondary | ICD-10-CM

## 2012-01-07 DIAGNOSIS — G4710 Hypersomnia, unspecified: Secondary | ICD-10-CM | POA: Insufficient documentation

## 2012-01-10 ENCOUNTER — Ambulatory Visit: Payer: Medicaid Other | Admitting: Family Medicine

## 2012-01-11 ENCOUNTER — Other Ambulatory Visit: Payer: Self-pay | Admitting: Internal Medicine

## 2012-01-11 ENCOUNTER — Encounter: Payer: Self-pay | Admitting: Internal Medicine

## 2012-01-11 DIAGNOSIS — A6000 Herpesviral infection of urogenital system, unspecified: Secondary | ICD-10-CM

## 2012-01-11 MED ORDER — ACYCLOVIR 400 MG PO TABS: 400.0000 mg | ORAL_TABLET | Freq: Two times a day (BID) | ORAL | Status: DC

## 2012-01-14 ENCOUNTER — Other Ambulatory Visit: Payer: Self-pay | Admitting: Internal Medicine

## 2012-01-14 ENCOUNTER — Encounter: Payer: Self-pay | Admitting: Internal Medicine

## 2012-01-14 DIAGNOSIS — E1165 Type 2 diabetes mellitus with hyperglycemia: Secondary | ICD-10-CM

## 2012-01-14 DIAGNOSIS — E119 Type 2 diabetes mellitus without complications: Secondary | ICD-10-CM

## 2012-01-14 DIAGNOSIS — A6000 Herpesviral infection of urogenital system, unspecified: Secondary | ICD-10-CM

## 2012-01-14 DIAGNOSIS — J45909 Unspecified asthma, uncomplicated: Secondary | ICD-10-CM

## 2012-01-14 MED ORDER — ALBUTEROL SULFATE HFA 108 (90 BASE) MCG/ACT IN AERS: 2.0000 | INHALATION_SPRAY | Freq: Four times a day (QID) | RESPIRATORY_TRACT | Status: DC | PRN

## 2012-01-14 MED ORDER — ACYCLOVIR 400 MG PO TABS: 400.0000 mg | ORAL_TABLET | Freq: Two times a day (BID) | ORAL | Status: DC

## 2012-01-14 MED ORDER — ACCU-CHEK MULTICLIX LANCETS MISC: Status: DC

## 2012-01-14 MED ORDER — "INSULIN SYRINGE/NEEDLE 28G X 1/2"" 1 ML MISC": Status: DC

## 2012-01-14 MED ORDER — GLUCOSE BLOOD VI STRP: ORAL_STRIP | Status: DC

## 2012-01-24 ENCOUNTER — Other Ambulatory Visit (HOSPITAL_COMMUNITY)
Admission: RE | Admit: 2012-01-24 | Discharge: 2012-01-24 | Disposition: A | Discharge status: Home / Self Care | Payer: Medicaid Other | Source: Ambulatory Visit | Attending: Obstetrics & Gynecology | Admitting: Obstetrics & Gynecology

## 2012-01-24 ENCOUNTER — Encounter: Payer: Self-pay | Admitting: Family Medicine

## 2012-01-24 ENCOUNTER — Ambulatory Visit (INDEPENDENT_AMBULATORY_CARE_PROVIDER_SITE_OTHER): Payer: Medicaid Other | Admitting: Family Medicine

## 2012-01-24 VITALS — BP 130/82 | HR 95 | Temp 98.8°F | Ht 62.0 in | Wt 298.9 lb

## 2012-01-24 DIAGNOSIS — N920 Excessive and frequent menstruation with regular cycle: Secondary | ICD-10-CM

## 2012-01-24 DIAGNOSIS — K5900 Constipation, unspecified: Secondary | ICD-10-CM

## 2012-01-24 DIAGNOSIS — N921 Excessive and frequent menstruation with irregular cycle: Secondary | ICD-10-CM

## 2012-01-24 DIAGNOSIS — N946 Dysmenorrhea, unspecified: Secondary | ICD-10-CM

## 2012-01-24 DIAGNOSIS — K5909 Other constipation: Secondary | ICD-10-CM

## 2012-01-24 LAB — POCT PREGNANCY, URINE: Preg Test, Ur: NEGATIVE

## 2012-01-24 MED ORDER — LACTULOSE 10 GM/15ML PO SOLN: 20.0000 g | Freq: Two times a day (BID) | ORAL | Status: DC | PRN

## 2012-01-24 NOTE — Patient Instructions (Addendum)
Endometrial Biopsy This is a test in which a tissue sample (a biopsy) is taken from inside the uterus (womb). It is then looked at by a specialist under a microscope to see if the tissue is normal or abnormal. The endometrium is the lining of the uterus. This test helps determine where you are in your menstrual cycle and how hormone levels are affecting the lining of the uterus. Another use for this test is to diagnose endometrial cancer, tuberculosis, polyps, or inflammatory conditions and to evaluate uterine bleeding. PREPARATION FOR TEST No preparation or fasting is necessary. NORMAL FINDINGS No pathologic conditions. Presence of "secretory-type" endometrium 3 to 5 days before to normal menstruation. Ranges for normal findings may vary among different laboratories and hospitals. You should always check with your doctor after having lab work or other tests done to discuss the meaning of your test results and whether your values are considered within normal limits. MEANING OF TEST  Your caregiver will go over the test results with you and discuss the importance and meaning of your results, as well as treatment options and the need for additional tests if necessary. OBTAINING THE TEST RESULTS It is your responsibility to obtain your test results. Ask the lab or department performing the test when and how you will get your results. Document Released: 06/11/2004 Document Revised: 05/03/2011 Document Reviewed: 01/19/2008 [facility] Patient Information 2013 [place], [place].   Dysfunctional Uterine Bleeding Normally, menstrual periods begin between ages 45 to 43 in young women. A normal menstrual cycle/period may begin every 23 days up to 35 days and lasts from 1 to 7 days. Around 12 to 14 days before your menstrual period starts, ovulation (ovary produces an egg) occurs. When counting the time between menstrual periods, count from the first day of bleeding of the previous period to the first day of  bleeding of the next period. Dysfunctional (abnormal) uterine bleeding is bleeding that is different from a normal menstrual period. Your periods may come earlier or later than usual. They may be lighter, have blood clots or be heavier. You may have bleeding between periods, or you may skip one period or more. You may have bleeding after sexual intercourse, bleeding after menopause, or no menstrual period. CAUSES   Pregnancy (normal, miscarriage, tubal).  IUDs (intrauterine device, birth control).  Birth control pills.  Hormone treatment.  Menopause.  Infection of the cervix.  Blood clotting problems.  Infection of the inside lining of the uterus.  Endometriosis, inside lining of the uterus growing in the pelvis and other female organs.  Adhesions (scar tissue) inside the uterus.  Obesity or severe weight loss.  Uterine polyps inside the uterus.  Cancer of the vagina, cervix, or uterus.  Ovarian cysts or polycystic ovary syndrome.  Medical problems (diabetes, thyroid disease).  Uterine fibroids (noncancerous tumor).  Problems with your female hormones.  Endometrial hyperplasia, very thick lining and enlarged cells inside of the uterus.  Medicines that interfere with ovulation.  Radiation to the pelvis or abdomen.  Chemotherapy. DIAGNOSIS   Your doctor will discuss the history of your menstrual periods, medicines you are taking, changes in your weight, stress in your life, and any medical problems you may have.  Your doctor will do a physical and pelvic examination.  Your doctor may want to perform certain tests to make a diagnosis, such as:  Pap test.  Blood tests.  Cultures for infection.  CT scan.  Ultrasound.  Hysteroscopy.  Laparoscopy.  MRI.  Hysterosalpingography.  D and C.  Endometrial biopsy. TREATMENT  Treatment will depend on the cause of the dysfunctional uterine bleeding (DUB). Treatment may include:  Observing your menstrual  periods for a couple of months.  Prescribing medicines for medical problems, including:  Antibiotics.  Hormones.  Birth control pills.  Removing an IUD (intrauterine device, birth control).  Surgery:  D and C (scrape and remove tissue from inside the uterus).  Laparoscopy (examine inside the abdomen with a lighted tube).  Uterine ablation (destroy lining of the uterus with electrical current, laser, heat, or freezing).  Hysteroscopy (examine cervix and uterus with a lighted tube).  Hysterectomy (remove the uterus). HOME CARE INSTRUCTIONS   If medicines were prescribed, take exactly as directed. Do not change or switch medicines without consulting your caregiver.  Long term heavy bleeding may result in iron deficiency. Your caregiver may have prescribed iron pills. They help replace the iron that your body lost from heavy bleeding. Take exactly as directed.  Do not take aspirin or medicines that contain aspirin one week before or during your menstrual period. Aspirin may make the bleeding worse.  If you need to change your sanitary pad or tampon more than once every 2 hours, stay in bed with your feet elevated and a cold pack on your lower abdomen. Rest as much as possible, until the bleeding stops or slows down.  Eat well-balanced meals. Eat foods high in iron. Examples are:  Leafy green vegetables.  Whole-grain breads and cereals.  Eggs.  Meat.  Liver.  Do not try to lose weight until the abnormal bleeding has stopped and your blood iron level is back to normal. Do not lift more than ten pounds or do strenuous activities when you are bleeding.  For a couple of months, make note on your calendar, marking the start and ending of your period, and the type of bleeding (light, medium, heavy, spotting, clots or missed periods). This is for your caregiver to better evaluate your problem. SEEK MEDICAL CARE IF:   You develop nausea (feeling sick to your stomach) and  vomiting, dizziness, or diarrhea while you are taking your medicine.  You are getting lightheaded or weak.  You have any problems that may be related to the medicine you are taking.  You develop pain with your DUB.  You want to remove your IUD.  You want to stop or change your birth control pills or hormones.  You have any type of abnormal bleeding mentioned above.  You are over 50 years old and have not had a menstrual period yet.  You are 36 years old and you are still having menstrual periods.  You have any of the symptoms mentioned above.  You develop a rash. SEEK IMMEDIATE MEDICAL CARE IF:   An oral temperature above 102 F (38.9 C) develops.  You develop chills.  You are changing your sanitary pad or tampon more than once an hour.  You develop abdominal pain.  You pass out or faint. Document Released: 02/06/2000 Document Revised: 05/03/2011 Document Reviewed: 01/07/2009 [facility] Patient Information 2013 [place], [place].

## 2012-01-24 NOTE — Progress Notes (Signed)
Subjective:    Patient ID: Ana Long, female    DOB: Dec 23, 1975, 36 y.o.   MRN: [id number]  HPI Patient seen in [facility] 10/23 for vaginal irritation. Treated for yeast infection but also likely allergic/contact vaginitis component. That has resolved. She has poorly controlled, insulin-dependent diabetes. A1C in May 11.2.  CBG on 10/23 was 278. Per chart, she has poor compliance with follow-up at her PCP.  She originally made appointment for vaginal discharge but since her [facility] visit, she has had several episodes of heavy vaginal bleeding and cramping:  Oct 9-16, Oct 25-Nov 2, and 11/27 till today. She has been using "king-sized" pads, soaking 4 a day with clots. Prior to October her periods were regular with moderate cramping. Cramping has been much worse over past few months. The period before October 9 was "just spotting." She also c/o breast soreness and some nausea as well as constipation. She normally takes lactulose for chronic constipation but ran out.   Menstrual history:  Menarche around age 31, irregular during adolescence, but then monthly, 7 days, moderate flow with mild/moderate cramps.   She has never been pregnant. She uses condoms for contraception. No abnormal pap smears. Last pap March 2013 was negative. Hx genital HSV, on acyclovir for suppression for many years.    PMH: Patient Active Problem List  Diagnosis  . Genital herpes  . Diabetes mellitus type II, uncontrolled  . Hyperlipidemia  . Morbid obesity with BMI of 50 to 60  . Anemia  . Depression  . Carpal tunnel syndrome  . GERD  . Lumbago  . Dry skin  . Bilateral leg edema  . Osteoarthritis, knee  . Adhesive capsulitis of left shoulder  . Asthma  . Hypersomnia with sleep apnea   PSH:  D&C, wisdom teeth,  Aller:  PCN, doxy, metformin  FamHX: DM, HTN, cancers - dad lung, aunt mouth, GM ??   Review of Systems  Constitutional: Negative for fever, chills and unexpected weight change.  Eyes: Negative  for visual disturbance.  Respiratory: Negative for chest tightness and shortness of breath.   Cardiovascular: Negative for chest pain.  Gastrointestinal: Positive for nausea and constipation. Negative for vomiting, abdominal pain and diarrhea.  Genitourinary: Positive for pelvic pain. Negative for dysuria, urgency, frequency, vaginal discharge, difficulty urinating, genital sores and vaginal pain.  Neurological: Negative for light-headedness and headaches.       Objective:   Physical Exam  Constitutional: She is oriented to person, place, and time. No distress.       Morbidly obese  HENT:  Head: Normocephalic and atraumatic.  Eyes: Conjunctivae normal and EOM are normal.  Neck: Normal range of motion. Neck supple.  Cardiovascular: Normal rate, regular rhythm and normal heart sounds.   Pulmonary/Chest: Effort normal and breath sounds normal. No respiratory distress.  Abdominal: Soft. There is no rebound and no guarding.       Obese, + BS, non-tender  Genitourinary:       Normal external genitalia. Normal vagina. Cervix nulliparous, very low in vagina, no CMT. No bleeding present. Small amount of mucus discharge present - clear/white. Unable to palpate uterus or adnexa due to body habitus. Seen procedure note.  Musculoskeletal: Normal range of motion. She exhibits no tenderness.  Neurological: She is alert and oriented to person, place, and time.  Skin: Skin is warm and dry.  Psychiatric: She has a normal mood and affect.    Filed Vitals:   01/24/12 1458  BP: 130/82  Pulse: 95  Temp: 98.8 F (37.1 C)   Filed Weights   01/24/12 1458  Weight: 298 lb 14.4 oz (135.58 kg)  Body mass index is 54.67 kg/(m^2).     PROCEDURE NOTE ENDOMETRIAL BIOPSY     The indications for endometrial biopsy were reviewed.   Risks of the biopsy including cramping, bleeding, infection, uterine perforation, inadequate specimen and need for additional procedures  were discussed. The patient states she  understands and agrees to undergo procedure today. Consent was signed. Time out was performed. Urine HCG was negative. 800 mg ibuprofen given PO prior to procedure.  A sterile speculum was placed in the patient's vagina and the cervix was prepped with Betadine. A single-toothed tenaculum was placed on the anterior lip of the cervix to stabilize it. The 3 mm pipelle was introduced into the endometrial cavity without difficulty to a depth of 11 cm, and a moderate amount of tissue was obtained and sent to pathology. The instruments were removed from the patient's vagina. Minimal bleeding from the cervix was noted. The patient tolerated the procedure well. Routine post-procedure instructions were given to the patient. The patient will be called with the results and given recommendations for further management.    Assessment & Plan:  36 y.o. female with 1.  Menometrorrhagia/DUB of recent onset.   - hemodynamically stable, bleeding resolved -  F/U EMB results in 4 weeks. Pelvic sono ordered.  2.  Vaginitis - resolved 3.  DM, morbid obesity - patient strongly encouraged to f/u with PCP 4.  Constipation:  Lactulose reordered  Napoleon Form, MD

## 2012-01-26 ENCOUNTER — Encounter: Payer: Self-pay | Admitting: Family Medicine

## 2012-02-07 ENCOUNTER — Other Ambulatory Visit: Payer: Self-pay

## 2012-02-07 DIAGNOSIS — N920 Excessive and frequent menstruation with regular cycle: Secondary | ICD-10-CM

## 2012-02-09 ENCOUNTER — Ambulatory Visit (HOSPITAL_COMMUNITY)
Admission: RE | Admit: 2012-02-09 | Discharge: 2012-02-09 | Disposition: A | Discharge status: Home / Self Care | Payer: Medicaid Other | Source: Ambulatory Visit | Attending: Family Medicine | Admitting: Family Medicine

## 2012-02-09 DIAGNOSIS — N946 Dysmenorrhea, unspecified: Secondary | ICD-10-CM | POA: Insufficient documentation

## 2012-02-09 DIAGNOSIS — D252 Subserosal leiomyoma of uterus: Secondary | ICD-10-CM | POA: Insufficient documentation

## 2012-02-09 DIAGNOSIS — N949 Unspecified condition associated with female genital organs and menstrual cycle: Secondary | ICD-10-CM | POA: Insufficient documentation

## 2012-02-09 DIAGNOSIS — N921 Excessive and frequent menstruation with irregular cycle: Secondary | ICD-10-CM | POA: Insufficient documentation

## 2012-02-09 DIAGNOSIS — N938 Other specified abnormal uterine and vaginal bleeding: Secondary | ICD-10-CM | POA: Insufficient documentation

## 2012-02-09 DIAGNOSIS — N920 Excessive and frequent menstruation with regular cycle: Secondary | ICD-10-CM

## 2012-02-14 ENCOUNTER — Ambulatory Visit: Payer: Medicaid Other | Admitting: Obstetrics & Gynecology

## 2012-03-01 ENCOUNTER — Ambulatory Visit (INDEPENDENT_AMBULATORY_CARE_PROVIDER_SITE_OTHER): Payer: Medicaid Other | Admitting: Obstetrics & Gynecology

## 2012-03-01 ENCOUNTER — Encounter: Payer: Self-pay | Admitting: Obstetrics & Gynecology

## 2012-03-01 VITALS — BP 138/92 | HR 83 | Temp 97.1°F | Ht 62.0 in | Wt 297.7 lb

## 2012-03-01 DIAGNOSIS — Z6841 Body Mass Index (BMI) 40.0 and over, adult: Secondary | ICD-10-CM

## 2012-03-01 DIAGNOSIS — N938 Other specified abnormal uterine and vaginal bleeding: Secondary | ICD-10-CM | POA: Insufficient documentation

## 2012-03-01 DIAGNOSIS — N949 Unspecified condition associated with female genital organs and menstrual cycle: Secondary | ICD-10-CM

## 2012-03-01 MED ORDER — MEDROXYPROGESTERONE ACETATE 10 MG PO TABS: 20.0000 mg | ORAL_TABLET | Freq: Every day | ORAL | Status: DC

## 2012-03-01 NOTE — Progress Notes (Signed)
History:  37 y.o. G0P0 here today for followup after endometrial biopsy and pelvic ultrasound done for evaluation of AUB. No concerns.  The following portions of the patient's history were reviewed and updated as appropriate: allergies, current medications, past family history, past medical history, past social history, past surgical history and problem list.  Review of Systems:  Pertinent items are noted in HPI.  Objective:  Physical Exam BP 138/92  Pulse 83  Temp 97.1 F (36.2 C) (Oral)  Ht 5\' 2"  (1.575 m)  Wt 297 lb 11.2 oz (135.036 kg)  BMI 54.45 kg/m2  LMP 02/19/2012 Deferred  Labs and Imaging 01/25/2012 Endometrium biopsy:  PROLIFERATIVE ENDOMETRIUM AND FRAGMENTS OF BENIGN ENDOMETRIAL POLYP. NO HYPERPLASIA OR CARCINOMA.  02/09/2012  TRANSABDOMINAL AND TRANSVAGINAL ULTRASOUND OF PELVIS  Clinical Data: Menometrorrhagia.  Dysfunctional uterine bleeding. LMP 01/24/2012. Comparison:  06/24/2010  Findings: Uterus:  9.4 x 4.5 x 5.7 cm.  A tiny subserosal fibroid is seen projecting from the left posterior lower uterine segment which measures 1.4 cm.  Endometrium: Double layer thickness measures 9 mm transvaginally. No focal lesion visualized.  Right ovary: 4.2 x 2.4 x 2.6 cm.  Normal appearance.  Left ovary: 3.8 x 2.7 x 3.8 cm.  Normal appearance.  Other Findings:  No free fluid  IMPRESSION:  1.  1.4 cm subserosal fibroid arising from the left posterior lower uterine segment.  No central/submucosal fibroids identified. 2.  Endometrial thickness measures 9 mm. If bleeding remains unresponsive to hormonal or medical therapy, sonohysterogram should be considered for focal lesion work-up. (Ref:  Radiological Reasoning: Algorithmic Workup of Abnormal Vaginal Bleeding with Endovaginal Sonography and Sonohysterography. AJR 2008; 161:W96-04) 3.  Normal ovaries.  No adnexal mass or free fluid identified.   Original Report Authenticated By: Myles Rosenthal, M.D.    Assessment & Plan:  Discussed management  options for abnormal uterine bleeding including oral Provera, Depo Provera, Mirena IUD, endometrial ablation (Novasure/Hydrothermal Ablation/Thermachoice) or hysterectomy as definitive surgical management.  Discussed risks and benefits of each method.  Patient desires oral Provera for now, will monitor response.  Bleeding precautions reviewed.

## 2012-03-01 NOTE — Patient Instructions (Signed)
Return to clinic for any scheduled appointments or for any gynecologic concerns as needed.   

## 2012-03-21 ENCOUNTER — Other Ambulatory Visit: Payer: Self-pay | Admitting: Family Medicine

## 2012-03-21 NOTE — Telephone Encounter (Signed)
Sports med patient?

## 2012-03-22 NOTE — Telephone Encounter (Signed)
I am going to forward this to the patient's PCP. Since I am no longer on faculty at [facility], will defer to PCP. If they need [facility] input, recommending discussion with Drs. Izora Ribas, or [employer].

## 2012-03-23 ENCOUNTER — Ambulatory Visit (HOSPITAL_COMMUNITY)
Admission: RE | Admit: 2012-03-23 | Discharge: 2012-03-23 | Disposition: A | Discharge status: Home / Self Care | Payer: Medicaid Other | Source: Ambulatory Visit | Attending: Sports Medicine | Admitting: Sports Medicine

## 2012-03-23 ENCOUNTER — Ambulatory Visit (INDEPENDENT_AMBULATORY_CARE_PROVIDER_SITE_OTHER): Payer: Medicaid Other | Admitting: Sports Medicine

## 2012-03-23 ENCOUNTER — Ambulatory Visit: Payer: Medicaid Other | Admitting: Sports Medicine

## 2012-03-23 VITALS — BP 124/76 | Ht 62.0 in | Wt 290.0 lb

## 2012-03-23 DIAGNOSIS — M2140 Flat foot [pes planus] (acquired), unspecified foot: Secondary | ICD-10-CM

## 2012-03-23 DIAGNOSIS — M25539 Pain in unspecified wrist: Secondary | ICD-10-CM | POA: Insufficient documentation

## 2012-03-23 DIAGNOSIS — G5600 Carpal tunnel syndrome, unspecified upper limb: Secondary | ICD-10-CM

## 2012-03-23 NOTE — Patient Instructions (Addendum)
Appt with Dr Fredia Beets on Tuesday February 4th at 11am [address]. Inside [place] and [facility] [phone] phone. No jewelry, lotion, and wear short sleeve shirt

## 2012-03-24 NOTE — Progress Notes (Signed)
  Subjective:    Patient ID: Ana Long, female    DOB: 01-23-1976, 37 y.o.   MRN: [id number]  HPI chief complaint: Right wrist and bilateral feet pain  37 year old female comes in today complaining of right wrist and bilateral feet pain. In regards to the right wrist, she has been diagnosed previously with carpal tunnel syndrome. A cortisone injection done back in the spring of 2013 provided her with minimum symptom relief. She describes a diffuse discomfort that involves the entire wrist with radiating pain into the hand. She has numbness and tingling in all the fingers. She notices weakness as well. Intermittent swelling. She does note some weakness intermittently. Pain will occasionally radiate up the forearm to the elbow. No recent trauma. No fevers or chills. No history of gout.  In regards to her feet, she is having anterior foot and ankle pain worse with activity. Symptoms here have all been present for a couple of weeks. Again no trauma. She has not noticed any swelling. Denies similar problems in the past.  Past medical history as well as current medications are reviewed. Medical history is significant for insulin-dependent diabetes and reflux disease.  Socially, she is a former smoker. She denies alcohol use.   Review of Systems     Objective:   Physical Exam Well-developed, well-nourished. Obese. No acute distress. Awake alert and oriented x3.  Right wrist: Limited range of motion secondary to pain. Diffuse tenderness to palpation. No joint effusion. No obvious soft tissue swelling. Skin is not erythematous or warm to touch. Positive Tinel's over the cubital tunnel. No noticeable muscle atrophy. Good radial and ulnar pulses.  Examination of her feet in the standing position shows pes planus. She is tender to palpation along the anterior tibialis tendon bilaterally. No obvious swelling. There is no ankle effusion. Ankles are stable to ligamentous exam. She walks without  a significant limp.       Assessment & Plan:  1. Right wrist pain likely secondary to carpal tunnel syndrome 2. 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 These films are unremarkable. I would like to pursue an EMG/nerve conduction study to evaluate the degree of median nerve irritation. I've given her a cockup wrist brace to wear in the meantime. She will followup 2-3 days after her EMG to go over those results and delineate further treatment in regards to her carpal tunnel syndrome. I've asked her to bring her walking shoes or tennis shoes to that followup visit so that we can fit him with a scaphoid pad for her pes planus. Call with questions or concerns in the interim.

## 2012-04-04 ENCOUNTER — Ambulatory Visit (INDEPENDENT_AMBULATORY_CARE_PROVIDER_SITE_OTHER): Payer: Medicaid Other | Admitting: Sports Medicine

## 2012-04-04 VITALS — BP 136/88 | Ht 62.0 in | Wt 293.0 lb

## 2012-04-04 DIAGNOSIS — M2140 Flat foot [pes planus] (acquired), unspecified foot: Secondary | ICD-10-CM

## 2012-04-04 DIAGNOSIS — G5600 Carpal tunnel syndrome, unspecified upper limb: Secondary | ICD-10-CM

## 2012-04-04 NOTE — Progress Notes (Signed)
  Subjective:    Patient ID: Ana Long, female    DOB: November 04, 1975, 37 y.o.   MRN: [id number]  HPI  Patient comes in today for followup. Recent EMG/nerve conduction study shows evidence of moderate to severe carpal tunnel syndrome. There is evidence of denervation and reinnervation. Her symptoms are chronic and have failed conservative treatment to date. Physical exam was not repeated. I have recommended referral to hand surgery for definitive treatment.  She did bring in her shoes so that we could fit those with green sports insoles and scaphoid pads.  She will followup with me when necessary.     Review of Systems     Objective:   Physical Exam        Assessment & Plan:

## 2012-04-08 ENCOUNTER — Other Ambulatory Visit: Payer: Self-pay | Admitting: Internal Medicine

## 2012-04-08 DIAGNOSIS — G5600 Carpal tunnel syndrome, unspecified upper limb: Secondary | ICD-10-CM

## 2012-04-19 ENCOUNTER — Ambulatory Visit (INDEPENDENT_AMBULATORY_CARE_PROVIDER_SITE_OTHER): Payer: Medicaid Other | Admitting: Sports Medicine

## 2012-04-19 VITALS — BP 128/80 | Ht 62.0 in | Wt 293.0 lb

## 2012-04-19 DIAGNOSIS — M67919 Unspecified disorder of synovium and tendon, unspecified shoulder: Secondary | ICD-10-CM

## 2012-04-19 DIAGNOSIS — M25519 Pain in unspecified shoulder: Secondary | ICD-10-CM

## 2012-04-19 DIAGNOSIS — M7580 Other shoulder lesions, unspecified shoulder: Secondary | ICD-10-CM

## 2012-04-19 NOTE — Progress Notes (Signed)
  Subjective:    Patient ID: Ana Long, female    DOB: 07/11/1975, 37 y.o.   MRN: [id number]  HPI  chief complaint: Bilateral shoulder pain  Patient comes in today complaining of 4 days of bilateral shoulder pain. She has a history of rotator cuff tendinopathy in the left shoulder treated with physical therapy several months ago with good results. She was doing well until she awoke a few days ago with bilateral shoulder pain and stiffness. Her symptoms have gradually improved especially her range of motion but she is still somewhat uncomfortable. Heat does seem to be helpful. She takes occasional ibuprofen but is careful because of her stomach. She also takes occasional tramadol but it makes her sleepy. No numbness or tingling other than the numbness and tingling that she is experiencing with her carpal tunnel syndrome. This is well documented in previous notes and she has an appointment with a hand specialist next week to discuss merits of surgical release.  Medical history is unchanged. She has uncontrolled diabetes. Reflux disease. History of depression as well. Medications are reviewed.     Review of Systems     Objective:   Physical Exam   Well-developed, well-nourished. No acute distress. Negative Spurling's bilaterally. Patient has full range of motion in each shoulder but does have pain with movement in all planes. There is no soft tissue swelling. Diffuse tenderness to palpation but nothing focal. Global weakness in both upper extremity secondary to pain but again nothing focal. Good radial and ulnar pulses bilaterally       Assessment & Plan:  1 bilateral shoulder pain likely secondary to rotator cuff tendinitis 2. Uncontrolled diabetes mellitus 3. Morbid obesity  AP and axillary x-rays of the shoulder. I recommended that she continue with moist heat and range of motion exercises. I do not want to do cortisone injections given her uncontrolled diabetes. I'm  hopeful that her symptoms will resolve if given enough time. I will call her next week after I reviewed her x-rays. If symptoms persist we could discussed the merits of a return physical therapy. She has Ultram to take when necessary if pain is severe.

## 2012-04-20 ENCOUNTER — Ambulatory Visit (HOSPITAL_COMMUNITY)
Admission: RE | Admit: 2012-04-20 | Discharge: 2012-04-20 | Disposition: A | Discharge status: Home / Self Care | Payer: Medicaid Other | Source: Ambulatory Visit | Attending: Sports Medicine | Admitting: Sports Medicine

## 2012-04-20 DIAGNOSIS — M25519 Pain in unspecified shoulder: Secondary | ICD-10-CM

## 2012-04-24 ENCOUNTER — Encounter: Payer: Medicaid Other | Admitting: Internal Medicine

## 2012-04-25 ENCOUNTER — Other Ambulatory Visit: Payer: Self-pay | Admitting: *Deleted

## 2012-04-25 ENCOUNTER — Telehealth: Payer: Self-pay | Admitting: Sports Medicine

## 2012-04-25 MED ORDER — TRAMADOL HCL 50 MG PO TABS: 50.0000 mg | ORAL_TABLET | Freq: Every day | ORAL | Status: DC

## 2012-04-25 NOTE — Telephone Encounter (Signed)
I spoke with the patient on the phone regarding x-rays of both shoulders. Right shoulder has a slightly high riding humeral head which may suggest some rotator cuff disease. The left shoulder x-ray is unremarkable. Patient continues to have pain primarily at night. She's taking intermittent doses of ibuprofen. I would normally inject her shoulders with cortisone but her uncontrolled diabetes makes this a risky procedure. Instead, I would like to try some Ultram each bedtime. I will call in 50 mg tablets and I've instructed her to break them in half if she experiences too much sedation. If symptoms persist or worsen she will followup for reevaluation. 

## 2012-04-27 ENCOUNTER — Ambulatory Visit (INDEPENDENT_AMBULATORY_CARE_PROVIDER_SITE_OTHER): Payer: Medicaid Other | Admitting: Internal Medicine

## 2012-04-27 ENCOUNTER — Encounter: Payer: Medicaid Other | Admitting: Internal Medicine

## 2012-04-27 VITALS — BP 114/74 | HR 97 | Temp 97.3°F | Ht 62.0 in | Wt 297.1 lb

## 2012-04-27 DIAGNOSIS — Z79899 Other long term (current) drug therapy: Secondary | ICD-10-CM

## 2012-04-27 DIAGNOSIS — IMO0001 Reserved for inherently not codable concepts without codable children: Secondary | ICD-10-CM

## 2012-04-27 DIAGNOSIS — E1165 Type 2 diabetes mellitus with hyperglycemia: Secondary | ICD-10-CM

## 2012-04-27 DIAGNOSIS — N644 Mastodynia: Secondary | ICD-10-CM

## 2012-04-27 DIAGNOSIS — M546 Pain in thoracic spine: Secondary | ICD-10-CM

## 2012-04-27 DIAGNOSIS — N181 Chronic kidney disease, stage 1: Secondary | ICD-10-CM

## 2012-04-27 LAB — POCT GLYCOSYLATED HEMOGLOBIN (HGB A1C): Hemoglobin A1C: 13.5

## 2012-04-27 MED ORDER — INSULIN ASPART 100 UNIT/ML ~~LOC~~ SOLN: 10.0000 [IU] | Freq: Every day | SUBCUTANEOUS | Status: DC

## 2012-04-27 NOTE — Patient Instructions (Signed)
General Instructions:  Start injecting 10 units of insulin aspart (NovoLog) short acting insulin before dinner every day.  Check your blood sugar each morning and after dinner.  Come back in one month to see Norm Parcel, our diabetes nurse, and me.   Treatment Goals:  Goals (1 Years of Data) as of 04/27/12         As of Today 04/19/12 04/04/12 03/23/12 03/01/12     Blood Pressure    . Blood Pressure < 130/80  114/74 128/80 136/88 124/76 138/92     Result Component    . HEMOGLOBIN A1C < 7.0  13.5        . LDL CALC < 100            Progress Toward Treatment Goals:  Treatment Goal 04/27/2012  Hemoglobin A1C deteriorated    Self Care Goals & Plans:  Self Care Goal 04/27/2012  Manage my medications take my medicines as prescribed; refill my medications on time  Eat healthy foods drink diet soda or water instead of juice or soda; eat baked foods instead of fried foods; eat smaller portions  Be physically active park at the far end of the parking lot    Home Blood Glucose Monitoring 04/27/2012  Check my blood sugar 2 times a day  When to check my blood sugar before breakfast; after dinner     Care Management & Community Referrals:  Referral 04/27/2012  Referrals made for care management support diabetes educator

## 2012-04-27 NOTE — Progress Notes (Signed)
Subjective:    Patient ID: Ana Long, female    DOB: Aug 17, 1975, 37 y.o.   MRN: [id number]  HPI:  This is a 37 year old woman with uncontrolled type II diabetes mellitus and a history of missed appointments who comes to the clinic for chronic disease management and with a number of acute complaints. Diabetes She currently takes 92 units of insulin glargine at bedtime and she reports good compliance.  She does not check her sugars at all.  She blames this on forgetfullness and her children playing with and breaking her glucometer. She reports polydipsia, polyuria, and neuropathic type pain in her toes.  She denies dizziness and visual disturbances. Breast pain Onset months ago. Provoked by long periods of standing or walking. Quality is a warm sensation and an ache in both breasts. Pain does not radiate outside of the breasts. Severity rated as moderate. Timing is for hours after provocation. Pain alleviated by ibuprofen. No aggravating factors. Not associated with fevers, chills, nipple discharge, breast skin changes, or palpable masses. Back pain Onset this morning after waking. No provoking incident. Quality described as an ache "like I pulled a muscle." Pain originates on the right side of the spine around T10 and radiates along the right, most inferior rib to her right posterior axillary line. Severity rated as mild to moderate. Timing is constant since onset. No alieviating factors identified. Pain aggravated by right arm movement, walking, and breathing. Not associated with skin changes, dyspnea, palpations, fevers, chills, or chest pain.    Review of Systems  Constitutional: Negative.  Negative for fever and chills.  HENT: Negative.  Negative for hearing loss, ear pain, congestion, sore throat and ear discharge.   Eyes: Negative.  Negative for visual disturbance.  Respiratory: Negative.  Negative for cough and shortness of breath.   Cardiovascular: Negative.  Negative for  chest pain, palpitations and leg swelling.  Gastrointestinal: Negative.  Negative for nausea, vomiting, abdominal pain, diarrhea, constipation and blood in stool.  Endocrine: Positive for polydipsia and polyuria.  Genitourinary: Positive for menstrual problem (DUB, managed by GYN). Negative for dysuria, hematuria, vaginal bleeding, vaginal discharge, difficulty urinating, genital sores, vaginal pain and pelvic pain.  Musculoskeletal: Positive for back pain.  Skin: Negative.  Negative for rash.  Neurological: Negative.  Negative for dizziness and headaches.  Psychiatric/Behavioral: Negative.  Negative for dysphoric mood.     Current Medications: 1. Acyclovir 400mg  BID 2. Albuterol MDI 2 puffs every 6 hours as needed 3. Ibuprofen, patient takes OTC ibuprofen up to 1,000mg  at a time for dysmenorrhea and various myalgias 4. Lactulose 20g BID PRN 5. Insulin glargine 92 units qHS 6. Daily probiotic 7. Tramadol 50mg  qHS     Objective:   Physical Exam: GENERAL: obese; no acute distress HEAD: atraumatic, normocephalic EYES: pupils equal, round and reactive; sclera anicteric; normal conjunctiva EARS: canals patent and TMs normal bilaterally NOSE/THROAT: oropharynx clear, moist mucous membranes, pink gingiva NECK: supple, thyroid normal in size and without palpable nodules LYMPH: no cervical or supraclavicular lymphadenopathy LUNGS: clear to auscultation bilaterally, normal work of breathing HEART: normal rate and regular rhythm; normal S1 and S2 without S3 or S4; no murmurs, rubs, or clicks PULSES: radial, dorsalis pedis, and posterior tibial pulses are 2+ and symmetric ABDOMEN: soft, non-tender, normal bowel sounds BREAST: no erythema, warmth, dimpling, or retractions of the skin; nipples and areola normal in appearance and without discharge; palpation of the breast tissue reveals normal glandular breast tissue without masses MSK: tenderness with palpation of  the right, posterior thorax; no  spine tenderness; no decreased range of motion; no erythema or skin changes SENSATION: intact, see diabetic foot exam documentation for details SKIN: warm, dry, intact, normal turgor, no rashes EXTREMITIES: no peripheral edema, clubbing, or cyanosis PSYCH: patient is alert and oriented, mood and affect are normal and congruent, thought content is normal without delusions, thought process is linear, speech is normal and non-pressured, behavior is normal, judgement and insight are normal   Filed Vitals:   04/27/12 1337  BP: 114/74  Pulse: 97  Temp: 97.3 F (36.3 C)    BP Readings from Last 3 Encounters:  04/27/12 114/74  04/19/12 128/80  04/04/12 136/88    Diabetes Lab Tests  04/27/12 1410  Random Glucose 350*  Hemoglobin A1c 13.5*  Urine Microalb-Creat Ratio 62.5*    BMET Component Value Date/Time   NA 133* 04/27/2012 1500   K 4.2 04/27/2012 1500   CL 95* 04/27/2012 1500   CO2 27 04/27/2012 1500   GLUCOSE 317* 04/27/2012 1500   BUN 6 04/27/2012 1500   CREATININE 0.58 04/27/2012 1500   CALCIUM 9.5 04/27/2012 1500   GFRAA > 89 07/08/2009 0550         Assessment & Plan:

## 2012-04-28 ENCOUNTER — Encounter: Payer: Self-pay | Admitting: Internal Medicine

## 2012-04-28 DIAGNOSIS — N644 Mastodynia: Secondary | ICD-10-CM | POA: Insufficient documentation

## 2012-04-28 DIAGNOSIS — N181 Chronic kidney disease, stage 1: Secondary | ICD-10-CM | POA: Insufficient documentation

## 2012-04-28 DIAGNOSIS — M546 Pain in thoracic spine: Secondary | ICD-10-CM | POA: Insufficient documentation

## 2012-04-28 LAB — BASIC METABOLIC PANEL WITH GFR
BUN: 6 mg/dL (ref 6–23)
Creat: 0.58 mg/dL (ref 0.50–1.10)
GFR, Est African American: >89 mL/min
GFR, Est Non African American: >89 mL/min
Potassium: 4.2 mEq/L (ref 3.5–5.3)
Sodium: 133 mEq/L — ABNORMAL LOW (ref 135–145)

## 2012-04-28 LAB — MICROALBUMIN / CREATININE URINE RATIO: Microalb Creat Ratio: 62.5 mg/g — ABNORMAL HIGH (ref 0.0–30.0)

## 2012-04-28 MED ORDER — IBUPROFEN 200 MG PO TABS: 200.0000 mg | ORAL_TABLET | Freq: Three times a day (TID) | ORAL | Status: DC | PRN

## 2012-04-28 NOTE — Assessment & Plan Note (Addendum)
Kidney function remains normal, but microalbuminuria is increasing.  Urine microalbumin-creatinine ratio was 17.2 in May 2013 and now it is 62.5.  Consider adding lisinopril at next visit.

## 2012-04-28 NOTE — Assessment & Plan Note (Signed)
No concerning exam findings or history details.  History is consistent with obesity and large breasts without a correctly fitting bra, causing strain of the suspensory ligaments.  Recommended she go to a store that can fit her for an appropriately supportive bra.

## 2012-04-28 NOTE — Assessment & Plan Note (Addendum)
Exam and history consistent with a muscle strain.  I recommended ibuprofen, icing, and continued activity.  I recommended she cease taking more than 800mg  of ibuprofen at one time.  I set a limit of 800mg  every eight hours.

## 2012-04-28 NOTE — Assessment & Plan Note (Addendum)
Lab Results  Component Value Date   HGBA1C 13.5 04/27/2012   HGBA1C 11.7 07/06/2011   HGBA1C 10.9 06/03/2010     Assessment:  Diabetes control: poor control (HgbA1C >9%)  Progress toward A1C goal:  deteriorated  Plan:  Medications:  Continue insulin glargine 92 units at bedtime and add 10 units of insulin aspart before dinner  Home glucose monitoring: New glucometer and test strips called in to pharmacy   Frequency: 2 times a day   Timing: before breakfast;after dinner  Instruction/counseling given: reminded to get eye exam and discussed the need for weight loss  Other plans: Referred to [facility]

## 2012-05-01 ENCOUNTER — Ambulatory Visit: Payer: Medicaid Other | Admitting: Internal Medicine

## 2012-05-04 ENCOUNTER — Encounter: Payer: Self-pay | Admitting: Internal Medicine

## 2012-06-02 ENCOUNTER — Other Ambulatory Visit: Payer: Self-pay | Admitting: Obstetrics & Gynecology

## 2012-06-05 ENCOUNTER — Other Ambulatory Visit: Payer: Self-pay | Admitting: Internal Medicine

## 2012-06-16 ENCOUNTER — Ambulatory Visit (INDEPENDENT_AMBULATORY_CARE_PROVIDER_SITE_OTHER): Payer: Medicaid Other | Admitting: Internal Medicine

## 2012-06-16 ENCOUNTER — Encounter: Payer: Self-pay | Admitting: Internal Medicine

## 2012-06-16 VITALS — BP 99/70 | HR 87 | Temp 98.4°F | Ht 62.0 in | Wt 306.1 lb

## 2012-06-16 DIAGNOSIS — N949 Unspecified condition associated with female genital organs and menstrual cycle: Secondary | ICD-10-CM

## 2012-06-16 DIAGNOSIS — G4710 Hypersomnia, unspecified: Secondary | ICD-10-CM

## 2012-06-16 DIAGNOSIS — Z23 Encounter for immunization: Secondary | ICD-10-CM

## 2012-06-16 DIAGNOSIS — N181 Chronic kidney disease, stage 1: Secondary | ICD-10-CM

## 2012-06-16 DIAGNOSIS — N938 Other specified abnormal uterine and vaginal bleeding: Secondary | ICD-10-CM

## 2012-06-16 DIAGNOSIS — IMO0001 Reserved for inherently not codable concepts without codable children: Secondary | ICD-10-CM

## 2012-06-16 DIAGNOSIS — E1165 Type 2 diabetes mellitus with hyperglycemia: Secondary | ICD-10-CM

## 2012-06-16 MED ORDER — UREA 40 % EX LOTN: TOPICAL_LOTION | CUTANEOUS | Status: DC

## 2012-06-16 NOTE — Assessment & Plan Note (Signed)
Irregular menstrual bleeding.  Seen by gynecology on 03/01/2012. See their note in Epic.

## 2012-06-16 NOTE — Patient Instructions (Signed)
General Instructions:  Keep up the good work!  Continue taking 92 units of Lantus every evening.  Stop taking short acting insulin for now.  Follow up with Norm Parcel.    Treatment Goals:  Goals (1 Years of Data) as of 06/16/12         04/27/12 04/19/12 04/04/12 03/23/12 03/01/12     Blood Pressure    . Blood Pressure < 130/80  114/74 128/80 136/88 124/76 138/92     Result Component    . HEMOGLOBIN A1C < 7.0  13.5        . LDL CALC < 100            Progress Toward Treatment Goals:  Treatment Goal 06/16/2012  Hemoglobin A1C unable to assess    Self Care Goals & Plans:  Self Care Goal 04/27/2012  Manage my medications take my medicines as prescribed; refill my medications on time  Eat healthy foods drink diet soda or water instead of juice or soda; eat baked foods instead of fried foods; eat smaller portions  Be physically active park at the far end of the parking lot    Home Blood Glucose Monitoring 04/27/2012  Check my blood sugar 2 times a day  When to check my blood sugar before breakfast; after dinner     Care Management & Community Referrals:  Referral 06/16/2012  Referrals made for care management support diabetes educator

## 2012-06-16 NOTE — Assessment & Plan Note (Addendum)
Lab Results  Component Value Date   HGBA1C 13.5 04/27/2012   HGBA1C 11.7 07/06/2011   HGBA1C 10.9 06/03/2010     Assessment: Diabetes control: poor control (HgbA1C >9%) Progress toward A1C goal:  unable to assess Comments: Patient is finally buying into treatment. She reports much better compliance with insulin and has gotten a new meter and is using it. I'm hopefull we will start seeing results in the A1c soon.  Plan: Medications:  Stop insulin aspart, continue insulin glargine 92 units every evening Instruction/counseling given: reminded to get eye exam, reminded to bring blood glucose meter & log to each visit, reminded to bring medications to each visit and discussed foot care Educational resources provided: brochure Other plans: referral to [facility] for DM education.  She has used urea-based cream in the past for her feet. I have refilled this today.

## 2012-06-16 NOTE — Assessment & Plan Note (Signed)
Discussed proteinuria with patient. Stressed the importance of tight glycemic control. With her blood pressures, we will not be able to start an ACE inhibitor or an ARB.

## 2012-06-16 NOTE — Assessment & Plan Note (Signed)
Patient has documented sleep apnea on a sleep study from the pre-Epic era. [facility] assist her in obtaining needed supplies. I have just recently signed a request for a new mask. She uses CPAP every night.

## 2012-06-16 NOTE — Progress Notes (Signed)
  Subjective:    Patient ID: Jay Schlichter McDougaldWard, female    DOB: Aug 23, 1975, 37 y.o.   MRN: [id number]  HPI:  This is a 37 year old woman with uncontrolled type 2 diabetes mellitus and a history of missed appointments who comes to clinic for chronic disease management. Diabetes She now admits that she was not taking her insulin regularly at her last visit (maybe once a week).  At that visit, I had started insulin aspart once a day with a meal, thinking she was being under treated with Lantus alone. She tried this once along with taking insulin glargine regularly, and this dropped her blood sugar too low. She is now taking insulin glargine consistently 92 units every bedtime. She is no longer taking short-acting insulin. She says that she has not measured a single blood glucose at home that was higher than 200. She does need new lancets for her meter and she needs help learning how to use her meter affectively. Breast Pain Patient obtained a more appropriately sized broad and breast pain has resolved.    Review of Systems  All other systems reviewed and are negative.       Objective:   Physical Exam: GENERAL: obses; no acute distress HEAD: atraumatic, normocephalic NECK: supple, thyroid normal in size and without palpable nodules LYMPH: no cervical or supraclavicular lymphadenopathy LUNGS: clear to auscultation bilaterally, normal work of breathing HEART: normal rate and regular rhythm; normal S1 and S2 without S3 or S4; no murmurs, rubs, or clicks PULSES: radial 2+ and symmetric ABDOMEN: soft, non-tender, normal bowel sounds SKIN: warm, dry, intact, normal turgor, no rashes EXTREMITIES: no peripheral edema, clubbing, or cyanosis PSYCH: patient is alert and oriented, mood and affect are normal and congruent  Filed Vitals:   06/16/12 1408  BP: 99/70  Pulse: 87  Temp: 98.4 F (36.9 C)    BP Readings from Last 3 Encounters:  06/16/12 99/70  04/27/12 114/74  04/19/12 128/80     Lab Results  Component Value Date   HGBA1C 13.5 04/27/2012   HGBA1C 11.7 07/06/2011   HGBA1C 10.9 06/03/2010   Lab Results  Component Value Date   MICROALBUR 5.04* 04/27/2012   LDLCALC 104* 07/06/2011   CREATININE 0.58 04/27/2012    Recent Labs  06/16/12 1407  GLUCAP 200*             Assessment & Plan:

## 2012-06-18 NOTE — Progress Notes (Signed)
Case discussed with Dr. Wallace at the time of the visit.  We reviewed the resident's history and exam and pertinent patient test results.  I agree with the assessment, diagnosis and plan of care documented in the resident's note. 

## 2012-06-28 ENCOUNTER — Ambulatory Visit: Payer: Medicaid Other | Admitting: Dietician

## 2012-06-29 ENCOUNTER — Other Ambulatory Visit: Payer: Self-pay | Admitting: Internal Medicine

## 2012-07-05 ENCOUNTER — Ambulatory Visit (INDEPENDENT_AMBULATORY_CARE_PROVIDER_SITE_OTHER): Payer: Medicaid Other | Admitting: Dietician

## 2012-07-05 VITALS — Wt 306.8 lb

## 2012-07-05 DIAGNOSIS — E1165 Type 2 diabetes mellitus with hyperglycemia: Secondary | ICD-10-CM

## 2012-07-05 NOTE — Progress Notes (Signed)
Meter downloaded: average for 30 days was 138, > 75% in target range.

## 2012-07-05 NOTE — Patient Instructions (Addendum)
Please try to eat about 3 carb choices at each meal and 1 for snacks    Examples:  2 starch + 1 fruit = 3 carb choices    1 starch + 1 milk + 1 fruit = 3 carb choices    2 starch + 1 milk = 3 carb choices    3 starches = 3 carb choices  Veggies and meats and healthy fats do not count- add them as needed.   Please make a follow up appointment for 3-4 weeks

## 2012-07-07 ENCOUNTER — Other Ambulatory Visit: Payer: Self-pay | Admitting: Internal Medicine

## 2012-07-07 DIAGNOSIS — E1165 Type 2 diabetes mellitus with hyperglycemia: Secondary | ICD-10-CM

## 2012-07-07 NOTE — Assessment & Plan Note (Signed)
Lab Results  Component Value Date   HGBA1C 13.5 04/27/2012   HGBA1C 11.7 07/06/2011   HGBA1C 10.9 06/03/2010     Assessment: Diabetes control: Improved, CBGs are within target range Progress toward A1C goal:  Unable to assess, next A1c due in June  Plan: Medications:  Decrease Lantus from 92 units to 87 units

## 2012-07-18 ENCOUNTER — Encounter (HOSPITAL_BASED_OUTPATIENT_CLINIC_OR_DEPARTMENT_OTHER): Payer: Self-pay | Admitting: *Deleted

## 2012-07-19 ENCOUNTER — Other Ambulatory Visit: Payer: Self-pay | Admitting: Orthopedic Surgery

## 2012-07-20 ENCOUNTER — Encounter (HOSPITAL_BASED_OUTPATIENT_CLINIC_OR_DEPARTMENT_OTHER): Payer: Self-pay | Admitting: *Deleted

## 2012-07-20 NOTE — Pre-Procedure Instructions (Signed)
To come for BMET and EKG 

## 2012-07-26 ENCOUNTER — Ambulatory Visit (HOSPITAL_BASED_OUTPATIENT_CLINIC_OR_DEPARTMENT_OTHER): Admission: RE | Admit: 2012-07-26 | Payer: Medicaid Other | Source: Ambulatory Visit | Admitting: Orthopedic Surgery

## 2012-07-26 ENCOUNTER — Encounter (HOSPITAL_BASED_OUTPATIENT_CLINIC_OR_DEPARTMENT_OTHER): Admission: RE | Payer: Self-pay | Source: Ambulatory Visit

## 2012-07-26 SURGERY — RELEASE, CARPAL TUNNEL, ENDOSCOPIC
Anesthesia: Monitor Anesthesia Care | Laterality: Right

## 2012-07-27 ENCOUNTER — Encounter (HOSPITAL_BASED_OUTPATIENT_CLINIC_OR_DEPARTMENT_OTHER): Payer: Self-pay | Admitting: *Deleted

## 2012-07-27 ENCOUNTER — Emergency Department (HOSPITAL_COMMUNITY)
Admission: EM | Admit: 2012-07-27 | Discharge: 2012-07-28 | Disposition: A | Discharge status: Home / Self Care | Payer: Medicaid Other | Attending: Emergency Medicine | Admitting: Emergency Medicine

## 2012-07-27 ENCOUNTER — Emergency Department (HOSPITAL_COMMUNITY): Payer: Medicaid Other

## 2012-07-27 ENCOUNTER — Encounter (HOSPITAL_COMMUNITY): Payer: Self-pay

## 2012-07-27 DIAGNOSIS — J029 Acute pharyngitis, unspecified: Secondary | ICD-10-CM | POA: Insufficient documentation

## 2012-07-27 DIAGNOSIS — Z91040 Latex allergy status: Secondary | ICD-10-CM | POA: Insufficient documentation

## 2012-07-27 DIAGNOSIS — Z8669 Personal history of other diseases of the nervous system and sense organs: Secondary | ICD-10-CM | POA: Insufficient documentation

## 2012-07-27 DIAGNOSIS — E119 Type 2 diabetes mellitus without complications: Secondary | ICD-10-CM | POA: Insufficient documentation

## 2012-07-27 DIAGNOSIS — Z8639 Personal history of other endocrine, nutritional and metabolic disease: Secondary | ICD-10-CM | POA: Insufficient documentation

## 2012-07-27 DIAGNOSIS — Z79899 Other long term (current) drug therapy: Secondary | ICD-10-CM | POA: Insufficient documentation

## 2012-07-27 DIAGNOSIS — Z8739 Personal history of other diseases of the musculoskeletal system and connective tissue: Secondary | ICD-10-CM | POA: Insufficient documentation

## 2012-07-27 DIAGNOSIS — R0789 Other chest pain: Secondary | ICD-10-CM | POA: Insufficient documentation

## 2012-07-27 DIAGNOSIS — R059 Cough, unspecified: Secondary | ICD-10-CM | POA: Insufficient documentation

## 2012-07-27 DIAGNOSIS — Z880 Allergy status to penicillin: Secondary | ICD-10-CM | POA: Insufficient documentation

## 2012-07-27 DIAGNOSIS — K219 Gastro-esophageal reflux disease without esophagitis: Secondary | ICD-10-CM | POA: Insufficient documentation

## 2012-07-27 DIAGNOSIS — Z872 Personal history of diseases of the skin and subcutaneous tissue: Secondary | ICD-10-CM | POA: Insufficient documentation

## 2012-07-27 DIAGNOSIS — Z87891 Personal history of nicotine dependence: Secondary | ICD-10-CM | POA: Insufficient documentation

## 2012-07-27 DIAGNOSIS — J45901 Unspecified asthma with (acute) exacerbation: Secondary | ICD-10-CM | POA: Insufficient documentation

## 2012-07-27 DIAGNOSIS — G4733 Obstructive sleep apnea (adult) (pediatric): Secondary | ICD-10-CM | POA: Insufficient documentation

## 2012-07-27 DIAGNOSIS — Z8619 Personal history of other infectious and parasitic diseases: Secondary | ICD-10-CM | POA: Insufficient documentation

## 2012-07-27 DIAGNOSIS — R05 Cough: Secondary | ICD-10-CM

## 2012-07-27 DIAGNOSIS — J3489 Other specified disorders of nose and nasal sinuses: Secondary | ICD-10-CM | POA: Insufficient documentation

## 2012-07-27 DIAGNOSIS — Z794 Long term (current) use of insulin: Secondary | ICD-10-CM | POA: Insufficient documentation

## 2012-07-27 DIAGNOSIS — Z6841 Body Mass Index (BMI) 40.0 and over, adult: Secondary | ICD-10-CM

## 2012-07-27 DIAGNOSIS — Z862 Personal history of diseases of the blood and blood-forming organs and certain disorders involving the immune mechanism: Secondary | ICD-10-CM | POA: Insufficient documentation

## 2012-07-27 NOTE — ED Notes (Signed)
Pt states shortness of breath for 2 weeks.  Now with cough, stuffy nose, sore throat.

## 2012-07-27 NOTE — H&P (Signed)
Ana Long is an 37 y.o. female.   CC / Reason for Visit: Right hand dysfunction HPI: This patient presents for reevaluation.  She is taking gabapentin which has not seemed to alleviate any symptoms.  She reports a right wrist somewhere popped yesterday and she continues to have pain and swelling in the wrist, particularly on the dorsal ulnar aspect.  Tramadol apparently is not helping.  She also started ibuprofen thousand milligrams 3 times a day 3 days ago.  She has a family history of gout, but denies any known personal history of it.  She continues to have numbness and tingling in the median distribution in addition to the ulnar-sided wrist pain. Presenting history follows: This patient is a 37 year old female who presents for evaluation of right hand problems.  She reports that she is dating back as far as 1998 on her intake form today.  She reports that several years ago she had numbness in the right hand and that she receive a recommendation for carpal tunnel release surgery at that time.  She underwent nerve conduction studies on 03-23-12, performed by Dr. 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  She reports that she drops objects on the right side and cannot hold a phone for long without her fingers falling asleep.  In addition however she has sensitivity to light touch over the dorsal and ulnar aspect of the distal forearm, wrist, and hand.  She is an insulin-requiring diabetic.  Past Medical History  Diagnosis Date  . GERD (gastroesophageal reflux disease)   . Diabetes mellitus     intolerant to Metformin  . Herpes genital   . PCOS (polycystic ovarian syndrome)   . Morbid obesity with BMI of 50 to 60 07/23/2009  . Dry skin     hands and feet  . Asthma     prn inhaler  . Carpal tunnel syndrome of right wrist 06/2012  . Stuffy and runny nose 07/20/2012    yellow-green drainage from nose  .  Cough with sputum 07/27/2012    thick green   . Sore throat 07/20/2012  . Arthritis     major joints  . Complication of anesthesia     states is hard to wake up post-op  . Family history of anesthesia complication     mother went into coma during c-section  . Sleep apnea, obstructive     uses CPAP nightly - had sleep study 08/22/2007  . Shortness of breath 07/27/2012    with exertion; has URI, is going to Urgent Care today  . Wheezing 07/27/2012    states inhaler is not helping    Past Surgical History  Procedure Laterality Date  . Hysteroscopy w/d&c  01-08-2002    Family History  Problem Relation Age of Onset  . Other Mother    Social History:  reports that she quit smoking about 2 years ago. She has never used smokeless tobacco. She reports that  drinks alcohol. She reports that she does not use illicit drugs.  Allergies:  Allergies  Allergen Reactions  . Citrus Hives and Itching  . Latex Swelling  . Penicillins Hives and Itching  . Soap Hives, Itching and Swelling    PUREX LAUNDRY DETERGENT  . Tomato Hives and Itching  . Doxycycline Other (See Comments)    AFFECTS JOINTS  . Metformin And Related Rash    GI UPSET    No prescriptions prior to admission  No results found for this or any previous visit (from the past 48 hour(s)). No results found.  Review of Systems  All other systems reviewed and are negative.    Height 5\' 2"  (1.575 m), weight 135.172 kg (298 lb), last menstrual period 06/13/2012. Physical Exam  Constitutional:  WD, WN, NAD HEENT:  NCAT, EOMI Neuro/Psych:  Alert & oriented to person, place, and time; appropriate mood & affect Lymphatic: No generalized UE edema or lymphadenopathy Extremities / MSK:  Both UE are normal with respect to appearance, ranges of motion, joint stability, muscle strength/tone, sensation, & perfusion except as otherwise noted:  There is a lot of sensitivity to light touch on the dorsal ulnar aspect of the distal forearm,  wrist, and hand.  It really borders on allodynia.  There is no hyperhidrosis and no trophic skin changes.  Wrist motion is reasonably good.  No pain with [facility] stress/grind testing.  It is difficult to palpate the ulnar aspect of the wrist for pain due to the sensitivity even to touch.  She can detect a 3.6 1 monofilament across all digits on the right side and has altered sensibility without provocation.  Small finger is spared subjectively.  Labs / X-rays: 3 views of the right wrist ordered and obtained today reveals no fractures, dislocations, or bony destructive lesions.  Assessment:  Right carpal tunnel syndrome, but also with dorsal ulnar skin hypersensitivity and underlying swelling  Plan:  We discussed how to proceed.  She was fitted with a wrist splint and we will arrange to perform an endoscopic carpal tunnel release next week.  She understands that some of her symptoms may not be related to the median nerve compression and we can reassess this postoperatively.  The details of the operative procedure were discussed with the patient.  Questions were invited and answered.  In addition to the goal of the procedure, the risks of the procedure to include but not limited to bleeding; infection; damage to the nerves or blood vessels that could result in bleeding, numbness, weakness, chronic pain, and the need for additional procedures; stiffness; the need for revision surgery; and anesthetic risks, the worst of which is death, were reviewed.  No specific outcome was guaranteed or implied.  Informed consent was obtained.  Prescriptions for postoperative analgesia were also written.  A return appointment 10-15 days postop was made.  Persephone Schriever A. 07/27/2012, 3:25 PM

## 2012-07-28 ENCOUNTER — Other Ambulatory Visit: Payer: Self-pay

## 2012-07-28 LAB — POCT I-STAT, CHEM 8
Glucose, Bld: 194 mg/dL — ABNORMAL HIGH (ref 70–99)
HCT: 37 % (ref 36.0–46.0)
Hemoglobin: 12.6 g/dL (ref 12.0–15.0)
Potassium: 4.5 mEq/L (ref 3.5–5.1)
Sodium: 138 mEq/L (ref 135–145)
TCO2: 29 mmol/L (ref 0–100)

## 2012-07-28 MED ORDER — BENZONATATE 100 MG PO CAPS: 200.0000 mg | ORAL_CAPSULE | Freq: Three times a day (TID) | ORAL | Status: DC

## 2012-07-28 MED ORDER — GUAIFENESIN ER 600 MG PO TB12: 600.0000 mg | ORAL_TABLET | Freq: Two times a day (BID) | ORAL | Status: DC

## 2012-07-28 MED ORDER — HYDROCOD POLST-CHLORPHEN POLST 10-8 MG/5ML PO LQCR: 5.0000 mL | Freq: Two times a day (BID) | ORAL | Status: DC | PRN

## 2012-07-28 NOTE — ED Provider Notes (Signed)
History     CSN: [id number]  Arrival date & time 07/27/12  2223   First MD Initiated Contact with Patient 07/28/12 0112      Chief Complaint  Patient presents with  . Shortness of Breath   HPI Ana Long is a 37 y.o. female history of diabetes, asthma, and obstructive sleep apnea who presents to the emergency department with shortness of breath, cough x2 weeks. She says it's gotten worse over the past couple days because been associated with stuffy nose and a sore throat. She says it's been difficult to use her CPAP. She says her cough is occasionally productive of green sputum, she has had some associated sharp, non-radiating chest pains, they're moderate in intensity, no dizziness, diaphoresis, nausea vomiting.  Past Medical History  Diagnosis Date  . GERD (gastroesophageal reflux disease)   . Diabetes mellitus     intolerant to Metformin  . Herpes genital   . PCOS (polycystic ovarian syndrome)   . Morbid obesity with BMI of 50 to 60 07/23/2009  . Dry skin     hands and feet  . Asthma     prn inhaler  . Carpal tunnel syndrome of right wrist 06/2012  . Stuffy and runny nose 07/20/2012    yellow-green drainage from nose  . Cough with sputum 07/27/2012    thick green   . Sore throat 07/20/2012  . Arthritis     major joints  . Complication of anesthesia     states is hard to wake up post-op  . Family history of anesthesia complication     mother went into coma during c-section  . Sleep apnea, obstructive     uses CPAP nightly - had sleep study 08/22/2007  . Shortness of breath 07/27/2012    with exertion; has URI, is going to Urgent Care today  . Wheezing 07/27/2012    states inhaler is not helping    Past Surgical History  Procedure Laterality Date  . Hysteroscopy w/d&c  01-08-2002    Family History  Problem Relation Age of Onset  . Other Mother     History  Substance Use Topics  . Smoking status: Former Smoker -- 10 years    Quit date: 05/24/2010  .  Smokeless tobacco: Never Used  . Alcohol Use: Yes     Comment: occasionally    OB History   Grav Para Term Preterm Abortions TAB SAB Ect Mult Living   0               Review of Systems At least 10pt or greater review of systems completed and are negative except where specified in the HPI.  Allergies  Citrus; Latex; Penicillins; Soap; Tomato; Doxycycline; and Metformin and related  Home Medications   Current Outpatient Rx  Name  Route  Sig  Dispense  Refill  . acyclovir (ZOVIRAX) 400 MG tablet   Oral   Take 1 tablet (400 mg total) by mouth 2 (two) times daily.   60 tablet   3   . albuterol (VENTOLIN HFA) 108 (90 BASE) MCG/ACT inhaler   Inhalation   Inhale 1-2 puffs into the lungs every 4 (four) hours as needed for wheezing or shortness of breath.   1 Inhaler   11   . famotidine (PEPCID) 40 MG tablet   Oral   Take 40 mg by mouth daily.         Marland Kitchen glipiZIDE (GLUCOTROL) 5 MG tablet      TAKE ONE TABLET  BY MOUTH TWICE DAILY   60 tablet   11   . ibuprofen (ADVIL,MOTRIN) 200 MG tablet   Oral   Take 1-4 tablets (200-800 mg total) by mouth every 8 (eight) hours as needed for pain.         Marland Kitchen lactulose (CHRONULAC) 10 GM/15ML solution   Oral   Take 20 g by mouth 2 (two) times daily as needed. For constipation         . LANTUS 100 UNIT/ML injection      INJECT 92 UNITS INTO THE ABDOMEN AT BEDTIME   10 mL   10   . Probiotic Product (PROBIOTIC DAILY PO)   Oral   Take by mouth.         . traMADol (ULTRAM) 50 MG tablet   Oral   Take 50 mg by mouth at bedtime as needed for pain.           BP 127/73  Pulse 94  Temp(Src) 98.2 F (36.8 C) (Oral)  Resp 22  SpO2 100%  LMP 07/25/2012  Physical Exam  Nursing notes reviewed.  Electronic medical record reviewed. VITAL SIGNS:   Filed Vitals:   07/27/12 2305 07/28/12 0351  BP: 127/73 137/80  Pulse: 94 96  Temp: 98.2 F (36.8 C)   TempSrc: Oral   Resp: 22 20  SpO2: 100% 97%   CONSTITUTIONAL: Awake,  oriented, appears non-toxic HENT: Atraumatic, normocephalic, oral mucosa pink and moist, airway patent. Nares patent without drainage. External ears normal. EYES: Conjunctiva clear, EOMI, PERRLA NECK: Trachea midline, non-tender, supple CARDIOVASCULAR: Normal heart rate, Normal rhythm, No murmurs, rubs, gallops PULMONARY/CHEST: Clear to auscultation, no rhonchi, wheezes, or rales. Symmetrical breath sounds. Non-tender. ABDOMINAL: Non-distended, soft, non-tender - no rebound or guarding.  BS normal. NEUROLOGIC: Non-focal, moving all four extremities, no gross sensory or motor deficits. EXTREMITIES: No clubbing, cyanosis, or edema SKIN: Warm, Dry, No erythema, No rash  ED Course  Procedures (including critical care time)  Date: 07/28/2012  Rate: 91  Rhythm: normal sinus rhythm  QRS Axis: normal  Intervals: normal  ST/T Wave abnormalities: T-wave inversion in lead 3, nonspecific T-wave flattening in lateral  Conduction Disutrbances: none  Narrative Interpretation: T-wave inversion seen previously on prior EKG 01/2008 - no acute ST or T wave abnormality suggestive of acute ischemia or infarction   Labs Reviewed  POCT I-STAT, CHEM 8 - Abnormal; Notable for the following:    Creatinine, Ser 0.40 (*)    Glucose, Bld 194 (*)    All other components within normal limits  D-DIMER, QUANTITATIVE  POCT I-STAT TROPONIN I   Dg Chest 2 View  07/27/2012   *RADIOLOGY REPORT*  Clinical Data: Shortness of breath  CHEST - 2 VIEW  Comparison: 12/13/2010  Findings: Cardiomediastinal silhouette is within normal limits. The lungs are clear. No pleural effusion.  No pneumothorax.  No acute osseous abnormality.  IMPRESSION: Normal chest.   Original Report Authenticated By: Christiana Pellant, M.D.     1. Persistent dry cough   2. Morbid obesity with BMI of 50 to 60     MDM  Ana Long is a 37 y.o. female presenting with likely viral upper respiratory infection, persistent dry cough, and she is  having difficulty I think secondary in part to morbid obesity. Patient was chiefly complaining because she has a surgery for carpal tunnel on Monday, she says "I don't want to fall sleep and not wake up." Patient family members had troubles with anesthesia and she is concerned  about that. Patient is nontoxic, afebrile, workup including a d-dimer is negative. A low suspicion for PE, acute coronary syndrome. Do not think the patient has pneumonia-chest x-ray is clear. Do not think this patient requires antibiotics. Patient can continue her bronchodilators, we'll give her medicine to help with cough-I instructed her to continue using her CPAP, she has no evidence for nasal congestion continue to use her CPAP while sleeping.  I explained the diagnosis and have given explicit precautions to return to the ER including any other new or worsening symptoms. The patient understands and accepts the medical plan as it's been dictated and I have answered their questions. Discharge instructions concerning home care and prescriptions have been given.  The patient is STABLE and is discharged to home in good condition.        Jones Skene, MD 07/28/12 [phone]

## 2012-07-28 NOTE — ED Notes (Signed)
Patient recently traveled to [place]. States she started coughing and feeling congested when she got home about 1 week ago. Patient states she has been coughing up yellow thick mucous. Dr Rulon Abide at bedside.

## 2012-07-31 ENCOUNTER — Ambulatory Visit (HOSPITAL_BASED_OUTPATIENT_CLINIC_OR_DEPARTMENT_OTHER): Admission: RE | Admit: 2012-07-31 | Payer: Medicaid Other | Source: Ambulatory Visit | Admitting: Orthopedic Surgery

## 2012-07-31 HISTORY — DX: Unspecified osteoarthritis, unspecified site: M19.90

## 2012-07-31 HISTORY — DX: Other complications of anesthesia, initial encounter: T88.59XA

## 2012-07-31 HISTORY — DX: Family history of other specified conditions: Z84.89

## 2012-07-31 HISTORY — DX: Carpal tunnel syndrome, right upper limb: G56.01

## 2012-07-31 HISTORY — DX: Adverse effect of unspecified anesthetic, initial encounter: T41.45XA

## 2012-07-31 SURGERY — RELEASE, CARPAL TUNNEL, ENDOSCOPIC
Anesthesia: Monitor Anesthesia Care | Laterality: Right

## 2012-08-08 ENCOUNTER — Other Ambulatory Visit: Payer: Self-pay | Admitting: Internal Medicine

## 2012-08-08 NOTE — Telephone Encounter (Signed)
Needs end of July or Aug appt with PCP

## 2012-08-17 ENCOUNTER — Other Ambulatory Visit: Payer: Self-pay | Admitting: Internal Medicine

## 2012-08-22 ENCOUNTER — Ambulatory Visit: Payer: Medicaid Other | Admitting: Internal Medicine

## 2012-08-22 ENCOUNTER — Encounter: Payer: Self-pay | Admitting: Internal Medicine

## 2012-08-22 ENCOUNTER — Emergency Department (HOSPITAL_COMMUNITY)
Admission: EM | Admit: 2012-08-22 | Discharge: 2012-08-22 | Disposition: A | Discharge status: Home / Self Care | Payer: Medicaid Other | Source: Home / Self Care

## 2012-08-22 ENCOUNTER — Ambulatory Visit (INDEPENDENT_AMBULATORY_CARE_PROVIDER_SITE_OTHER): Payer: Medicaid Other | Admitting: Internal Medicine

## 2012-08-22 VITALS — BP 127/93 | HR 87 | Temp 97.8°F | Ht 62.0 in | Wt 301.6 lb

## 2012-08-22 DIAGNOSIS — M1710 Unilateral primary osteoarthritis, unspecified knee: Secondary | ICD-10-CM

## 2012-08-22 DIAGNOSIS — E1165 Type 2 diabetes mellitus with hyperglycemia: Secondary | ICD-10-CM

## 2012-08-22 LAB — POCT GLYCOSYLATED HEMOGLOBIN (HGB A1C): Hemoglobin A1C: 9.2

## 2012-08-22 MED ORDER — INSULIN GLARGINE 100 UNIT/ML ~~LOC~~ SOLN: SUBCUTANEOUS | Status: DC

## 2012-08-22 NOTE — Assessment & Plan Note (Signed)
Lab Results  Component Value Date   HGBA1C 9.2 08/22/2012   HGBA1C 13.5 04/27/2012   HGBA1C 11.7 07/06/2011     Assessment: Diabetes control:   Progress toward A1C goal:  improved Comments:  Plan: Medications:  continue current medications Home glucose monitoring: Frequency:   Timing:   Instruction/counseling given: reminded to bring blood glucose meter & log to each visit Educational resources provided: brochure Self management tools provided:   Other plans: A1c improved significantly from a 13.5 on 04/27/12 to 9.2. Patient denies any symptoms of hypo-or hyperglycemia. Since he did not bring in her meter today, it is difficult to make an adjustment of her current regimen. Will continue Lantus 92 unit at bedtime and glipizide 5 mg daily. Patient was seen by diabetic educator today.

## 2012-08-22 NOTE — Patient Instructions (Signed)
1. Please continue to take your Lautus at the same dose. Please measure your blood sugar level  3 times per day. Please bring in your meter with you in next visit.    2. Please take all medications as prescribed.  3. If you have worsening of your symptoms or new symptoms arise, please call the clinic ([phone]), or go to the ER immediately if symptoms are severe.  You have done great job in taking all your medications. I appreciate it very much. Please continue doing that.

## 2012-08-22 NOTE — Assessment & Plan Note (Signed)
Patient's right knee pain due to osteoarthritis this chronic, which is at her baseline pain. She had a local injection at 8 month ago by sports medicine, which caused good relief. Patient would like to go back to sports medicine for possible local injection. Will follow up.

## 2012-08-22 NOTE — Progress Notes (Signed)
Patient ID: Ana Long, female   DOB: [birth year], 37 y.o.   MRN: [id number] Subjective:   Patient ID: Ana Long female   DOB: Jul 10, 1975 37 y.o.   MRN: [id number]  CC:   Follow up visit for DM-II. HPI:  Ana Long is a 37 y.o. lady with past medical history as outlined below, who presents for a followup visit today  1. DM-II:  Patient's diabetes was poorly controlled before March 2014 due to medication noncompliance. Her A1c was a 13.5 on 04/27/12. In her previous visit on 06/16/12, her insulin regimen was adjusted. Her short-term insulin was discontinued. Patient was instructed to take Lantus 92 units at bedtime. She has been taking his medication regularly until 3 days ago. Patient reports that she run out of her medications and stopped taking Lantus in the past 3 days. Today her CBG is 240 per patient. He denies any symptoms for hypo-or hyperglycemia. She said she measures her blood sugar 2 times a day, but did not bring in her meter. She does not remember number of her blood sugar levels.  2. R knee pain: Patient has chronic right knee pain for 7 years. She had local injection at 8 months ago by a sports medicine, which caused a good relief. Today she reports having right knee pain which is at her baseline chronic pain, but is very uncomfortable.  She does not have knee joint swelling or warmth. No fever or chills.  Denies fever, chills, fatigue, headaches,  cough, chest pain, SOB,  abdominal pain,diarrhea, constipation, dysuria, urgency, frequency, hematuria.   Past Medical History  Diagnosis Date  . GERD (gastroesophageal reflux disease)   . Diabetes mellitus     intolerant to Metformin  . Herpes genital   . PCOS (polycystic ovarian syndrome)   . Morbid obesity with BMI of 50 to 60 07/23/2009  . Dry skin     hands and feet  . Asthma     prn inhaler  . Carpal tunnel syndrome of right wrist 06/2012  . Stuffy and runny nose 07/20/2012    yellow-green  drainage from nose  . Cough with sputum 07/27/2012    thick green   . Sore throat 07/20/2012  . Arthritis     major joints  . Complication of anesthesia     states is hard to wake up post-op  . Family history of anesthesia complication     mother went into coma during c-section  . Sleep apnea, obstructive     uses CPAP nightly - had sleep study 08/22/2007  . Shortness of breath 07/27/2012    with exertion; has URI, is going to Urgent Care today  . Wheezing 07/27/2012    states inhaler is not helping   Current Outpatient Prescriptions  Medication Sig Dispense Refill  . ACCU-CHEK FASTCLIX LANCETS MISC 1 each by Other route 2 (two) times daily.  102 each  5  . acyclovir (ZOVIRAX) 400 MG tablet Take 1 tablet (400 mg total) by mouth 2 (two) times daily.  60 tablet  3  . albuterol (VENTOLIN HFA) 108 (90 BASE) MCG/ACT inhaler Inhale 1-2 puffs into the lungs every 4 (four) hours as needed for wheezing or shortness of breath.  1 Inhaler  11  . benzonatate (TESSALON) 100 MG capsule Take 2 capsules (200 mg total) by mouth every 8 (eight) hours.  21 capsule  0  . chlorpheniramine-HYDROcodone (TUSSIONEX PENNKINETIC ER) 10-8 MG/5ML LQCR Take 5 mLs by mouth every 12 (twelve) hours as  needed (Cough).  115 mL  0  . famotidine (PEPCID) 40 MG tablet Take 40 mg by mouth daily.      Marland Kitchen glipiZIDE (GLUCOTROL) 5 MG tablet TAKE ONE TABLET BY MOUTH TWICE DAILY  60 tablet  11  . glucose blood (ACCU-CHEK SMARTVIEW) test strip 1 each by Other route 2 (two) times daily.  100 each  5  . guaiFENesin (MUCINEX) 600 MG 12 hr tablet Take 1 tablet (600 mg total) by mouth 2 (two) times daily.  30 tablet  0  . ibuprofen (ADVIL,MOTRIN) 200 MG tablet Take 1-4 tablets (200-800 mg total) by mouth every 8 (eight) hours as needed for pain.      Marland Kitchen insulin glargine (LANTUS) 100 UNIT/ML injection INJECT 92 UNITS INTO THE ABDOMEN AT BEDTIME  10 mL  10  . lactulose (CHRONULAC) 10 GM/15ML solution Take 20 g by mouth 2 (two) times daily as  needed. For constipation      . Probiotic Product (PROBIOTIC DAILY PO) Take by mouth.      . traMADol (ULTRAM) 50 MG tablet Take 50 mg by mouth at bedtime as needed for pain.       No current facility-administered medications for this visit.   Family History  Problem Relation Age of Onset  . Other Mother    History   Social History  . Marital Status: Divorced    Spouse Name: N/A    Number of Children: 2  . Years of Education: N/A   Occupational History  . UNEMPLOYED    Social History Main Topics  . Smoking status: Former Smoker -- 10 years    Quit date: 05/24/2010  . Smokeless tobacco: Never Used  . Alcohol Use: Yes     Comment: occasionally  . Drug Use: No  . Sexually Active: Yes    Birth Control/ Protection: None     Comment: Lives w/a partner   Other Topics Concern  . None   Social History Narrative   Single, but lives with a partner   Has 2 children and cares for an infant during the day          Review of Systems: Full 14-point review of systems otherwise negative. See HPI.   Objective:  Physical Exam: Filed Vitals:   08/22/12 1436  BP: 127/93  Pulse: 87  Temp: 97.8 F (36.6 C)  TempSrc: Oral  Height: 5\' 2"  (1.575 m)  Weight: 301 lb 9.6 oz (136.805 kg)  SpO2: 97%   Constitutional: Vital signs reviewed.  Patient is a well-developed and well-nourished, in no acute distress and cooperative with exam.   HEENT:  Head: Normocephalic and atraumatic Ear: TM normal bilaterally Mouth: no erythema or exudates, MMM Eyes: PERRL, EOMI, conjunctivae normal, No scleral icterus.  Neck: Supple, Trachea midline normal ROM, No JVD, mass, thyromegaly, or carotid bruit present. No lymph node enlargement. Cardiovascular: RRR, S1 normal, S2 normal, no MRG, pulses symmetric and intact bilaterally Pulmonary/Chest: CTAB, no wheezes, rales, or rhonchi Abdominal: Soft. Non-tender, non-distended, bowel sounds are normal, no masses, organomegaly, or guarding present.  GU: no  CVA tenderness Musculoskeletal: No joint deformities, erythema, or stiffness, ROM full and non-tender. Right knee joint is tender, but no swelling or warmth. Hematology: no cervical, inginal, or axillary adenopathy.  Neurological: A&O x3, Strength is normal and symmetric bilaterally, cranial nerve II-XII are grossly intact. Skin: Warm, dry and intact. No rash, cyanosis, or clubbing.  Psychiatric: Normal mood and affect. speech and behavior is normal. Judgment and thought content  normal. Cognition and memory are normal.   Assessment & Plan:

## 2012-08-23 NOTE — Progress Notes (Signed)
Case discussed with Dr. Clyde Lundborg at the time of the visit.  We reviewed the resident's history and exam and pertinent patient test results.  I agree with the assessment, diagnosis, and plan of care documented in the resident's note.  I agree with Dr. Clyde Lundborg that given her medication regimen, it is difficult to change therapy without her glucometer.  However, she will likely need a change or addition of another agent to get her A1C to goal.  She will need close follow up and reminding to bring her meter to each visit.

## 2012-09-04 ENCOUNTER — Telehealth: Payer: Self-pay | Admitting: Dietician

## 2012-09-04 NOTE — Telephone Encounter (Signed)
Called to follow up on recent diabetes visit. Patient verbalized understanding to diabetes meal planning. Also mentions difficulty applying it.  Asked for assistance for sharps disposal. Confirmed laws for sharps disposal in [employer].

## 2012-09-04 NOTE — Progress Notes (Signed)
Patient seen for diabetes self-management training.

## 2012-09-13 ENCOUNTER — Encounter: Payer: Self-pay | Admitting: Family Medicine

## 2012-09-13 ENCOUNTER — Ambulatory Visit (INDEPENDENT_AMBULATORY_CARE_PROVIDER_SITE_OTHER): Payer: Medicaid Other | Admitting: Family Medicine

## 2012-09-13 VITALS — BP 104/73 | HR 98 | Ht 62.0 in | Wt 306.0 lb

## 2012-09-13 DIAGNOSIS — M25569 Pain in unspecified knee: Secondary | ICD-10-CM

## 2012-09-13 DIAGNOSIS — M25561 Pain in right knee: Secondary | ICD-10-CM

## 2012-09-13 NOTE — Patient Instructions (Addendum)
Take tylenol 500mg  1-2 tabs three times a day for pain. Aleve 1-2 tabs twice a day with food (Or the voltaren gel you have four times a day). Glucosamine sulfate 750mg  twice a day is a supplement that has been shown to help moderate to severe arthritis. Capsaicin topically up to four times a day may also help with pain. Cortisone injections are an option - you were given one of these today. If cortisone injections do not help, there are different types of shots that may help but they take longer to take effect. It's important that you continue to stay active. If you are overweight, try to lose weight through diet and exercise. Consider physical therapy to strengthen muscles around the joint that hurts to take pressure off of the joint itself. Shoe inserts with good arch support may be helpful. Walker or cane if needed. Heat or ice 15 minutes at a time 3-4 times a day as needed to help with pain. Water aerobics and cycling with low resistance are the best two types of exercise for arthritis. A knee brace may be helpful if your knee feels unstable due to pain. Follow up with me as needed.

## 2012-09-14 ENCOUNTER — Encounter: Payer: Self-pay | Admitting: Family Medicine

## 2012-09-14 DIAGNOSIS — M25561 Pain in right knee: Secondary | ICD-10-CM | POA: Insufficient documentation

## 2012-09-14 NOTE — Progress Notes (Signed)
Patient ID: Ana Long, female   DOB: May 27, 1975, 37 y.o.   MRN: [id number]  PCP: Ana Peat, DO  Subjective:   HPI: Patient is a 37 y.o. female here for right knee pain.  Patient reports 3-5 years of right knee pain. Had an injection in October 2013 that helped a lot until past 2 months when pain worsened again. Has been doing more cleaning and remodeling of her home. Has difficulty sleeping due to pain. Feels like it's giving out on her. Uses cane. Difficulty getting comfortable. Worse with twisting.  Past Medical History  Diagnosis Date  . GERD (gastroesophageal reflux disease)   . Diabetes mellitus     intolerant to Metformin  . Herpes genital   . PCOS (polycystic ovarian syndrome)   . Morbid obesity with BMI of 50 to 60 07/23/2009  . Dry skin     hands and feet  . Asthma     prn inhaler  . Carpal tunnel syndrome of right wrist 06/2012  . Stuffy and runny nose 07/20/2012    yellow-green drainage from nose  . Cough with sputum 07/27/2012    thick green   . Sore throat 07/20/2012  . Arthritis     major joints  . Complication of anesthesia     states is hard to wake up post-op  . Family history of anesthesia complication     mother went into coma during c-section  . Sleep apnea, obstructive     uses CPAP nightly - had sleep study 08/22/2007  . Shortness of breath 07/27/2012    with exertion; has URI, is going to Urgent Care today  . Wheezing 07/27/2012    states inhaler is not helping    Current Outpatient Prescriptions on File Prior to Visit  Medication Sig Dispense Refill  . ACCU-CHEK FASTCLIX LANCETS MISC 1 each by Other route 2 (two) times daily.  102 each  5  . acyclovir (ZOVIRAX) 400 MG tablet Take 1 tablet (400 mg total) by mouth 2 (two) times daily.  60 tablet  3  . albuterol (VENTOLIN HFA) 108 (90 BASE) MCG/ACT inhaler Inhale 1-2 puffs into the lungs every 4 (four) hours as needed for wheezing or shortness of breath.  1 Inhaler  11  . famotidine  (PEPCID) 40 MG tablet Take 40 mg by mouth daily.      Marland Kitchen glipiZIDE (GLUCOTROL) 5 MG tablet TAKE ONE TABLET BY MOUTH TWICE DAILY  60 tablet  11  . glucose blood (ACCU-CHEK SMARTVIEW) test strip 1 each by Other route 2 (two) times daily.  100 each  5  . ibuprofen (ADVIL,MOTRIN) 200 MG tablet Take 1-4 tablets (200-800 mg total) by mouth every 8 (eight) hours as needed for pain.      Marland Kitchen insulin glargine (LANTUS) 100 UNIT/ML injection INJECT 92 UNITS INTO THE ABDOMEN AT BEDTIME  10 mL  10  . lactulose (CHRONULAC) 10 GM/15ML solution Take 20 g by mouth 2 (two) times daily as needed. For constipation      . Probiotic Product (PROBIOTIC DAILY PO) Take by mouth.      . traMADol (ULTRAM) 50 MG tablet Take 50 mg by mouth at bedtime as needed for pain.       No current facility-administered medications on file prior to visit.    Past Surgical History  Procedure Laterality Date  . Hysteroscopy w/d&c  01-08-2002    Allergies  Allergen Reactions  . Citrus Hives and Itching  . Latex Swelling  . Penicillins  Hives and Itching  . Soap Hives, Itching and Swelling    PUREX LAUNDRY DETERGENT  . Tomato Hives and Itching  . Doxycycline Other (See Comments)    AFFECTS JOINTS  . Metformin And Related Rash    GI UPSET    History   Social History  . Marital Status: Divorced    Spouse Name: N/A    Number of Children: 2  . Years of Education: N/A   Occupational History  . UNEMPLOYED    Social History Main Topics  . Smoking status: Former Smoker -- 10 years    Quit date: 05/24/2010  . Smokeless tobacco: Never Used  . Alcohol Use: Yes     Comment: occasionally  . Drug Use: No  . Sexually Active: Yes    Birth Control/ Protection: None     Comment: Lives w/a partner   Other Topics Concern  . Not on file   Social History Narrative   Single, but lives with a partner   Has 2 children and cares for an infant during the day          Family History  Problem Relation Age of Onset  . Other  Mother     BP 104/73  Pulse 98  Ht 5\' 2"  (1.575 m)  Wt 306 lb (138.801 kg)  BMI 55.95 kg/m2  Review of Systems: See HPI above.    Objective:  Physical Exam:  Gen: NAD  R knee: No gross deformity, ecchymoses.  Minimal effusion. TTP medial > lateral joint lines.  TTP post patellar facets as well. FROM. Negative ant/post drawers. Negative valgus/varus testing. Negative lachmanns. Pain medially with mcmurrays, apleys. Negative apprehension. NV intact distally.    Assessment & Plan:  1. Right knee pain - known advanced DJD of left knee despite fairly normal radiographs.  Suspect similar appearance of right knee.  Repeated intraarticular injection today with ultrasound guidance into small effusion she has by u/s.  Tylenol, aleve, glucosamine, capsaicin.  Consider viscosupplementation, physical therapy.  F/u prn.  After informed written consent, patient was lying supine on exam table. Right knee was prepped with alcohol swab and utilizing superolateral approach under ultrasound guidance, patient's right knee was injected intraarticularly with 3:1 marcaine: depomedrol. Patient tolerated the procedure well without immediate complications.

## 2012-09-14 NOTE — Assessment & Plan Note (Signed)
known advanced DJD of left knee despite fairly normal radiographs.  Suspect similar appearance of right knee.  Repeated intraarticular injection today with ultrasound guidance into small effusion she has by u/s.  Tylenol, aleve, glucosamine, capsaicin.  Consider viscosupplementation, physical therapy.  F/u prn.  After informed written consent, patient was lying supine on exam table. Right knee was prepped with alcohol swab and utilizing superolateral approach under ultrasound guidance, patient's right knee was injected intraarticularly with 3:1 marcaine: depomedrol. Patient tolerated the procedure well without immediate complications.

## 2012-09-20 ENCOUNTER — Encounter: Payer: Self-pay | Admitting: Internal Medicine

## 2012-09-20 ENCOUNTER — Ambulatory Visit (INDEPENDENT_AMBULATORY_CARE_PROVIDER_SITE_OTHER): Payer: Medicaid Other | Admitting: Internal Medicine

## 2012-09-20 ENCOUNTER — Other Ambulatory Visit: Payer: Self-pay | Admitting: Orthopedic Surgery

## 2012-09-20 VITALS — BP 115/78 | HR 96 | Temp 98.6°F | Ht 62.0 in | Wt 299.6 lb

## 2012-09-20 DIAGNOSIS — E1165 Type 2 diabetes mellitus with hyperglycemia: Secondary | ICD-10-CM

## 2012-09-20 DIAGNOSIS — IMO0001 Reserved for inherently not codable concepts without codable children: Secondary | ICD-10-CM

## 2012-09-20 DIAGNOSIS — R209 Unspecified disturbances of skin sensation: Secondary | ICD-10-CM

## 2012-09-20 MED ORDER — AMITRIPTYLINE HCL 25 MG PO TABS: ORAL_TABLET | ORAL | Status: DC

## 2012-09-20 NOTE — Progress Notes (Signed)
Patient ID: Ana Long, female   DOB: 03/03/1975, 37 y.o.   MRN: [id number]   Subjective:   Patient ID: Ana Long female   DOB: 30-Jun-1975 37 y.o.   MRN: [id number]  HPI: Ana Long is a 37 y.o. female with a PMH of DM type 2, OSA, HLD, asthma and GERD presenting with complaint of a R breast lump.  She noticed a dime sized lump during routine self breast exam prior to her last period.  She checked again after the period and felt that the lump was now three smaller lumps.  She denies tenderness and is unable to feel the lumps today.  She reports that a breast US performed four years ago for similar complaint revealed scar tissue.  Her paternal aunt had breast cancer but no one on her mother's side of the family has had the disease.  Her last OBGyn visit was in January 2014 at [facility].  She also reports new onset numbness and tingling of both lower extremities that began 1-2 months ago.  Her last Hgb A1c was 9.2 and her BG in office today is 251.  She admits to recent dietary indiscretions and says her postprandial BG at home runs in the 270s.  She also sometimes falls asleep before taking her insulin and says she misses it about twice per week.      Past Medical History  Diagnosis Date  . GERD (gastroesophageal reflux disease)   . Diabetes mellitus     intolerant to Metformin  . Herpes genital   . PCOS (polycystic ovarian syndrome)   . Morbid obesity with BMI of 50 to 60 07/23/2009  . Dry skin     hands and feet  . Asthma     prn inhaler  . Carpal tunnel syndrome of right wrist 06/2012  . Stuffy and runny nose 07/20/2012    yellow-green drainage from nose  . Cough with sputum 07/27/2012    thick green   . Sore throat 07/20/2012  . Arthritis     major joints  . Complication of anesthesia     states is hard to wake up post-op  . Family history of anesthesia complication     mother went into coma during c-section  . Sleep apnea,  obstructive     uses CPAP nightly - had sleep study 08/22/2007  . Shortness of breath 07/27/2012    with exertion; has URI, is going to Urgent Care today  . Wheezing 07/27/2012    states inhaler is not helping   Current Outpatient Prescriptions  Medication Sig Dispense Refill  . ACCU-CHEK FASTCLIX LANCETS MISC 1 each by Other route 2 (two) times daily.  102 each  5  . acyclovir (ZOVIRAX) 400 MG tablet Take 1 tablet (400 mg total) by mouth 2 (two) times daily.  60 tablet  3  . albuterol (VENTOLIN HFA) 108 (90 BASE) MCG/ACT inhaler Inhale 1-2 puffs into the lungs every 4 (four) hours as needed for wheezing or shortness of breath.  1 Inhaler  11  . famotidine (PEPCID) 40 MG tablet Take 40 mg by mouth daily.      Marland Kitchen glipiZIDE (GLUCOTROL) 5 MG tablet TAKE ONE TABLET BY MOUTH TWICE DAILY  60 tablet  11  . glucose blood (ACCU-CHEK SMARTVIEW) test strip 1 each by Other route 2 (two) times daily.  100 each  5  . ibuprofen (ADVIL,MOTRIN) 200 MG tablet Take 1-4 tablets (200-800 mg total) by mouth every 8 (eight)  hours as needed for pain.      Marland Kitchen insulin glargine (LANTUS) 100 UNIT/ML injection INJECT 92 UNITS INTO THE ABDOMEN AT BEDTIME  10 mL  10  . lactulose (CHRONULAC) 10 GM/15ML solution Take 20 g by mouth 2 (two) times daily as needed. For constipation      . Probiotic Product (PROBIOTIC DAILY PO) Take by mouth.      . traMADol (ULTRAM) 50 MG tablet Take 50 mg by mouth at bedtime as needed for pain.       No current facility-administered medications for this visit.   Family History  Problem Relation Age of Onset  . Other Mother    History   Social History  . Marital Status: Divorced    Spouse Name: N/A    Number of Children: 2  . Years of Education: N/A   Occupational History  . UNEMPLOYED    Social History Main Topics  . Smoking status: Former Smoker -- 10 years    Quit date: 05/24/2010  . Smokeless tobacco: Never Used  . Alcohol Use: Yes     Comment: occasionally  . Drug Use: No  .  Sexually Active: Yes    Birth Control/ Protection: None     Comment: Lives w/a partner   Other Topics Concern  . None   Social History Narrative   Single, but lives with a partner   Has 2 children and cares for an infant during the day         Review of Systems: denies fever, fatigue, headache, SOB, dysuria; + constipation +parasthesias Objective:  Physical Exam: Filed Vitals:   09/20/12 1535  BP: 115/78  Pulse: 96  Temp: 98.6 F (37 C)  TempSrc: Oral  Height: 5\' 2"  (1.575 m)  Weight: 299 lb 9.6 oz (135.898 kg)  SpO2: 96%   General: Pleasant, cooperative, in NAD Breast:  No lumps or masses, no skin dimpling, no tenderness Cardiac: RRR, no rubs, murmurs or gallops Pulm: clear to auscultation bilaterally, moving normal volumes of air Abd: obese, soft, nontender, nondistended, BS present Ext: warm and well perfused, no pedal edema, +2 DPs, sensation intact Neuro: alert and oriented X3  Assessment & Plan:  37 yo female PMH of DM type 2, OSA, HLD, asthma and GERD here with complaint of R breast lump.  1.  Breast lump - no lumps or masses felt on exam.  Reassured patient that her risk of Breast CA is not increased given young age and lack of 1st degree female relative with breast/ovarian CA.   Recommended continued follow-up with [facility] where she goes for her annual pelvic and clinical breast exam.  Advised her to return to office if she notices any new lumps.  2. DM type 2 - Hgb A1c 9.2, not at goal.  Patient reports not taking her insulin about twice per week.  Discussed the importance of taking the insulin every night and monitoring her BG so we know wether or not her insulin needs to be increased.  She is also on Glipizide 5mg  BID. Discussed importance of healthy diet with patient and she plans to increase her vegetable intake and limit foods high in sugar.  She follows with the diabetes educator.  Will see patient back in 1 month to check how BG has been with increased  med compliance.  Will consider the need to continue Glipizide at that time.   3. Diabetic neuropathy - will prescribe 25mg  Amitriptyline 30 minutes before bedtime.  Advised patient to  take close to bedtime as it may make her drowsy.  Also advised to keep glass of water near the bed as med can cause dry mouth.

## 2012-09-20 NOTE — Patient Instructions (Addendum)
General Instructions:   Please take 92 units of insulin every night.  Continue to monitor your blood sugar daily.  Amitriptyline has been prescribed for you for the numbness and tingling in your legs and feet.  Please take this medication at bedtime.  This medication may make you drowsy and/or cause dry mouth.  It may help to keep a glass of water at your bedside.    Treatment Goals:  Goals (1 Years of Data) as of 09/20/12         As of Today 09/13/12 08/22/12 07/28/12 07/27/12     Blood Pressure    . Blood Pressure < 130/80  115/78 104/73 127/93 137/80 127/73     Result Component    . HEMOGLOBIN A1C < 7.0    9.2      . LDL CALC < 100            Progress Toward Treatment Goals:  Treatment Goal 09/20/2012  Hemoglobin A1C unchanged    Self Care Goals & Plans:  Self Care Goal 09/20/2012  Manage my medications take my medicines as prescribed; bring my medications to every visit; refill my medications on time  Monitor my health keep track of my blood glucose; bring my glucose meter and log to each visit  Eat healthy foods drink diet soda or water instead of juice or soda; eat more vegetables; eat foods that are low in salt; eat baked foods instead of fried foods  Be physically active find an activity I enjoy  Meeting treatment goals maintain the current self-care plan    Home Blood Glucose Monitoring 09/20/2012  Check my blood sugar (No Data)  When to check my blood sugar after breakfast; after lunch; after dinner; before breakfast     Care Management & Community Referrals:  Referral 09/20/2012  Referrals made for care management support (No Data)  Referrals made to community resources none       Diabetes Meal Planning Guide The diabetes meal planning guide is a tool to help you plan your meals and snacks. It is important for people with diabetes to manage their blood glucose (sugar) levels. Choosing the right foods and the right amounts throughout your day will help control your  blood glucose. Eating right can even help you improve your blood pressure and reach or maintain a healthy weight. CARBOHYDRATE COUNTING MADE EASY When you eat carbohydrates, they turn to sugar. This raises your blood glucose level. Counting carbohydrates can help you control this level so you feel better. When you plan your meals by counting carbohydrates, you can have more flexibility in what you eat and balance your medicine with your food intake. Carbohydrate counting simply means adding up the total amount of carbohydrate grams in your meals and snacks. Try to eat about the same amount at each meal. Foods with carbohydrates are listed below. Each portion below is 1 carbohydrate serving or 15 grams of carbohydrates. Ask your dietician how many grams of carbohydrates you should eat at each meal or snack. Grains and Starches  1 slice bread.   English muffin or hotdog/hamburger bun.   cup cold cereal (unsweetened).   cup cooked pasta or rice.   cup starchy vegetables (corn, potatoes, peas, beans, winter squash).  1 tortilla (6 inches).   bagel.  1 waffle or pancake (size of a CD).   cup cooked cereal.  4 to 6 small crackers. *Whole grain is recommended. Fruit  1 cup fresh unsweetened berries, melon, papaya, pineapple.  1  small fresh fruit.   banana or mango.   cup fruit juice (4 oz unsweetened).   cup canned fruit in natural juice or water.  2 tbs dried fruit.  12 to 15 grapes or cherries. Milk and Yogurt  1 cup fat-free or 1% milk.  1 cup soy milk.  6 oz light yogurt with sugar-free sweetener.  6 oz low-fat soy yogurt.  6 oz plain yogurt. Vegetables  1 cup raw or  cup cooked is counted as 0 carbohydrates or a "free" food.  If you eat 3 or more servings at 1 meal, count them as 1 carbohydrate serving. Other Carbohydrates   oz chips or pretzels.   cup ice cream or frozen yogurt.   cup sherbet or sorbet.  2 inch square cake, no frosting.  1  tbs honey, sugar, jam, jelly, or syrup.  2 small cookies.  3 squares of graham crackers.  3 cups popcorn.  6 crackers.  1 cup broth-based soup.  Count 1 cup casserole or other mixed foods as 2 carbohydrate servings.  Foods with less than 20 calories in a serving may be counted as 0 carbohydrates or a "free" food. You may want to purchase a book or computer software that lists the carbohydrate gram counts of different foods. In addition, the nutrition facts panel on the labels of the foods you eat are a good source of this information. The label will tell you how big the serving size is and the total number of carbohydrate grams you will be eating per serving. Divide this number by 15 to obtain the number of carbohydrate servings in a portion. Remember, 1 carbohydrate serving equals 15 grams of carbohydrate. SERVING SIZES Measuring foods and serving sizes helps you make sure you are getting the right amount of food. The list below tells how big or small some common serving sizes are.  1 oz.........4 stacked dice.  3 oz........Marland KitchenDeck of cards.  1 tsp.......Marland KitchenTip of little finger.  1 tbs......Marland KitchenMarland KitchenThumb.  2 tbs.......Marland KitchenGolf ball.   cup......Marland KitchenHalf of a fist.  1 cup.......Marland KitchenA fist. SAMPLE DIABETES MEAL PLAN Below is a sample meal plan that includes foods from the grain and starches, dairy, vegetable, fruit, and meat groups. A dietician can individualize a meal plan to fit your calorie needs and tell you the number of servings needed from each food group. However, controlling the total amount of carbohydrates in your meal or snack is more important than making sure you include all of the food groups at every meal. You may interchange carbohydrate containing foods (dairy, starches, and fruits). The meal plan below is an example of a 2000 calorie diet using carbohydrate counting. This meal plan has 17 carbohydrate servings. Breakfast  1 cup oatmeal (2 carb servings).   cup light yogurt (1  carb serving).  1 cup blueberries (1 carb serving).   cup almonds. Snack  1 large apple (2 carb servings).  1 low-fat string cheese stick. Lunch  Chicken breast salad.  1 cup spinach.   cup chopped tomatoes.  2 oz chicken breast, sliced.  2 tbs low-fat [place] dressing.  12 whole-wheat crackers (2 carb servings).  12 to 15 grapes (1 carb serving).  1 cup low-fat milk (1 carb serving). Snack  1 cup carrots.   cup hummus (1 carb serving). Dinner  3 oz broiled salmon.  1 cup brown rice (3 carb servings). Snack  1  cups steamed broccoli (1 carb serving) drizzled with 1 tsp olive oil and lemon juice.  1  cup light pudding (2 carb servings). DIABETES MEAL PLANNING WORKSHEET Your dietician can use this worksheet to help you decide how many servings of foods and what types of foods are right for you.  BREAKFAST Food Group and Servings / Carb Servings Grain/Starches __________________________________ Dairy __________________________________________ Vegetable ______________________________________ Fruit ___________________________________________ Meat __________________________________________ Fat ____________________________________________ LUNCH Food Group and Servings / Carb Servings Grain/Starches ___________________________________ Dairy ___________________________________________ Fruit ____________________________________________ Meat ___________________________________________ Fat _____________________________________________ Laural Golden Food Group and Servings / Carb Servings Grain/Starches ___________________________________ Dairy ___________________________________________ Fruit ____________________________________________ Meat ___________________________________________ Fat _____________________________________________ SNACKS Food Group and Servings / Carb Servings Grain/Starches ___________________________________ Dairy  ___________________________________________ Vegetable _______________________________________ Fruit ____________________________________________ Meat ___________________________________________ Fat _____________________________________________ DAILY TOTALS Starches _________________________ Vegetable ________________________ Fruit ____________________________ Dairy ____________________________ Meat ____________________________ Fat ______________________________ Document Released: 11/05/2004 Document Revised: 05/03/2011 Document Reviewed: 09/16/2008 [employer] Patient Information 2014 [employer]

## 2012-09-21 NOTE — Progress Notes (Signed)
I saw and evaluated the patient.  I personally confirmed the key portions of the history and exam documented by Dr. Wilson and I reviewed pertinent patient test results.  The assessment, diagnosis, and plan were formulated together and I agree with the documentation in the resident's note. 

## 2012-09-22 DIAGNOSIS — G5601 Carpal tunnel syndrome, right upper limb: Secondary | ICD-10-CM

## 2012-09-22 HISTORY — DX: Carpal tunnel syndrome, right upper limb: G56.01

## 2012-09-25 ENCOUNTER — Encounter (HOSPITAL_BASED_OUTPATIENT_CLINIC_OR_DEPARTMENT_OTHER): Payer: Self-pay | Admitting: *Deleted

## 2012-09-28 ENCOUNTER — Encounter (HOSPITAL_BASED_OUTPATIENT_CLINIC_OR_DEPARTMENT_OTHER): Admission: RE | Admit: 2012-09-28 | Payer: Medicaid Other | Source: Ambulatory Visit

## 2012-09-28 LAB — BASIC METABOLIC PANEL
BUN: 8 mg/dL (ref 6–23)
CO2: 26 mEq/L (ref 19–32)
GFR calc non Af Amer: >90 mL/min (ref >90)
Glucose, Bld: 190 mg/dL — ABNORMAL HIGH (ref 70–99)
Potassium: 3.9 mEq/L (ref 3.5–5.1)
Sodium: 135 mEq/L (ref 135–145)

## 2012-10-02 ENCOUNTER — Ambulatory Visit (HOSPITAL_BASED_OUTPATIENT_CLINIC_OR_DEPARTMENT_OTHER): Payer: Medicaid Other | Admitting: Certified Registered Nurse Anesthetist

## 2012-10-02 ENCOUNTER — Ambulatory Visit (HOSPITAL_BASED_OUTPATIENT_CLINIC_OR_DEPARTMENT_OTHER)
Admission: RE | Admit: 2012-10-02 | Discharge: 2012-10-02 | Disposition: A | Discharge status: Home / Self Care | Payer: Medicaid Other | Source: Ambulatory Visit | Attending: Orthopedic Surgery | Admitting: Orthopedic Surgery

## 2012-10-02 ENCOUNTER — Encounter (HOSPITAL_BASED_OUTPATIENT_CLINIC_OR_DEPARTMENT_OTHER): Payer: Self-pay | Admitting: Certified Registered Nurse Anesthetist

## 2012-10-02 ENCOUNTER — Encounter (HOSPITAL_BASED_OUTPATIENT_CLINIC_OR_DEPARTMENT_OTHER)
Admission: RE | Disposition: A | Discharge status: Home / Self Care | Payer: Self-pay | Source: Ambulatory Visit | Attending: Orthopedic Surgery

## 2012-10-02 DIAGNOSIS — Z91018 Allergy to other foods: Secondary | ICD-10-CM | POA: Insufficient documentation

## 2012-10-02 DIAGNOSIS — M129 Arthropathy, unspecified: Secondary | ICD-10-CM | POA: Insufficient documentation

## 2012-10-02 DIAGNOSIS — E282 Polycystic ovarian syndrome: Secondary | ICD-10-CM | POA: Insufficient documentation

## 2012-10-02 DIAGNOSIS — J45909 Unspecified asthma, uncomplicated: Secondary | ICD-10-CM | POA: Insufficient documentation

## 2012-10-02 DIAGNOSIS — E119 Type 2 diabetes mellitus without complications: Secondary | ICD-10-CM | POA: Insufficient documentation

## 2012-10-02 DIAGNOSIS — Z87891 Personal history of nicotine dependence: Secondary | ICD-10-CM | POA: Insufficient documentation

## 2012-10-02 DIAGNOSIS — G5600 Carpal tunnel syndrome, unspecified upper limb: Secondary | ICD-10-CM | POA: Insufficient documentation

## 2012-10-02 DIAGNOSIS — Z882 Allergy status to sulfonamides status: Secondary | ICD-10-CM | POA: Insufficient documentation

## 2012-10-02 DIAGNOSIS — Z91040 Latex allergy status: Secondary | ICD-10-CM | POA: Insufficient documentation

## 2012-10-02 DIAGNOSIS — G4733 Obstructive sleep apnea (adult) (pediatric): Secondary | ICD-10-CM | POA: Insufficient documentation

## 2012-10-02 DIAGNOSIS — Z794 Long term (current) use of insulin: Secondary | ICD-10-CM | POA: Insufficient documentation

## 2012-10-02 DIAGNOSIS — Z888 Allergy status to other drugs, medicaments and biological substances status: Secondary | ICD-10-CM | POA: Insufficient documentation

## 2012-10-02 DIAGNOSIS — Z79899 Other long term (current) drug therapy: Secondary | ICD-10-CM | POA: Insufficient documentation

## 2012-10-02 DIAGNOSIS — Z880 Allergy status to penicillin: Secondary | ICD-10-CM | POA: Insufficient documentation

## 2012-10-02 DIAGNOSIS — K219 Gastro-esophageal reflux disease without esophagitis: Secondary | ICD-10-CM | POA: Insufficient documentation

## 2012-10-02 HISTORY — PX: CARPAL TUNNEL RELEASE: SHX101

## 2012-10-02 LAB — GLUCOSE, CAPILLARY
Glucose-Capillary: 197 mg/dL — ABNORMAL HIGH (ref 70–99)
Glucose-Capillary: 212 mg/dL — ABNORMAL HIGH (ref 70–99)

## 2012-10-02 LAB — POCT HEMOGLOBIN-HEMACUE: Hemoglobin: 12.8 g/dL (ref 12.0–15.0)

## 2012-10-02 SURGERY — RELEASE, CARPAL TUNNEL, ENDOSCOPIC
Anesthesia: Monitor Anesthesia Care | Site: Hand | Laterality: Right | Wound class: Clean

## 2012-10-02 MED ORDER — LACTATED RINGERS IV SOLN
INTRAVENOUS | Status: DC
  Administered 2012-10-02: 08:00:00 via INTRAVENOUS

## 2012-10-02 MED ORDER — METOCLOPRAMIDE HCL 5 MG/ML IJ SOLN: 10.0000 mg | Freq: Once | INTRAMUSCULAR | Status: DC | PRN

## 2012-10-02 MED ORDER — MIDAZOLAM HCL 2 MG/2ML IJ SOLN: 1.0000 mg | INTRAMUSCULAR | Status: DC | PRN

## 2012-10-02 MED ORDER — OXYCODONE HCL 5 MG PO TABS: 5.0000 mg | ORAL_TABLET | Freq: Once | ORAL | Status: DC | PRN

## 2012-10-02 MED ORDER — VANCOMYCIN HCL 10 G IV SOLR
1500.0000 mg | INTRAVENOUS | Status: AC
  Administered 2012-10-02: 1500 mg via INTRAVENOUS

## 2012-10-02 MED ORDER — FENTANYL CITRATE 0.05 MG/ML IJ SOLN: 50.0000 ug | INTRAMUSCULAR | Status: DC | PRN

## 2012-10-02 MED ORDER — MIDAZOLAM HCL 2 MG/ML PO SYRP: 12.0000 mg | ORAL_SOLUTION | Freq: Once | ORAL | Status: DC | PRN

## 2012-10-02 MED ORDER — HYDROCODONE-ACETAMINOPHEN 5-325 MG PO TABS: 1.0000 | ORAL_TABLET | ORAL | Status: DC | PRN

## 2012-10-02 MED ORDER — LIDOCAINE HCL (CARDIAC) 20 MG/ML IV SOLN
INTRAVENOUS | Status: DC | PRN
  Administered 2012-10-02: 60 mg via INTRAVENOUS

## 2012-10-02 MED ORDER — ONDANSETRON HCL 4 MG/2ML IJ SOLN
INTRAMUSCULAR | Status: DC | PRN
  Administered 2012-10-02: 4 mg via INTRAVENOUS

## 2012-10-02 MED ORDER — OXYCODONE-ACETAMINOPHEN 5-325 MG PO TABS: 1.0000 | ORAL_TABLET | ORAL | Status: DC | PRN

## 2012-10-02 MED ORDER — VANCOMYCIN HCL 10 G IV SOLR: 1500.0000 mg | INTRAVENOUS | Status: DC

## 2012-10-02 MED ORDER — LIDOCAINE HCL 2 % IJ SOLN
INTRAMUSCULAR | Status: DC | PRN
  Administered 2012-10-02: 5 mL

## 2012-10-02 MED ORDER — BUPIVACAINE-EPINEPHRINE PF 0.5-1:200000 % IJ SOLN
INTRAMUSCULAR | Status: DC | PRN
  Administered 2012-10-02: 5 mL

## 2012-10-02 MED ORDER — FENTANYL CITRATE 0.05 MG/ML IJ SOLN
25.0000 ug | INTRAMUSCULAR | Status: DC | PRN
  Administered 2012-10-02 (×2): 50 ug via INTRAVENOUS

## 2012-10-02 MED ORDER — OXYCODONE HCL 5 MG/5ML PO SOLN: 5.0000 mg | Freq: Once | ORAL | Status: DC | PRN

## 2012-10-02 MED ORDER — HYDROMORPHONE HCL PF 1 MG/ML IJ SOLN: 0.5000 mg | INTRAMUSCULAR | Status: DC | PRN

## 2012-10-02 MED ORDER — LACTATED RINGERS IV SOLN: INTRAVENOUS | Status: DC

## 2012-10-02 MED ORDER — PROPOFOL INFUSION 10 MG/ML OPTIME
INTRAVENOUS | Status: DC | PRN
  Administered 2012-10-02: 50 ug/kg/min via INTRAVENOUS

## 2012-10-02 SURGICAL SUPPLY — 35 items
APPLICATOR COTTON TIP 6IN STRL (MISCELLANEOUS) ×2 IMPLANT
BANDAGE GAUZE ELAST BULKY 4 IN (GAUZE/BANDAGES/DRESSINGS) ×4 IMPLANT
BLADE HOOK ENDO STRL (BLADE) ×2 IMPLANT
BLADE SURG 15 STRL LF DISP TIS (BLADE) ×1 IMPLANT
BLADE SURG 15 STRL SS (BLADE) ×2
BLADE TRIANGLE EPF/EGR ENDO (BLADE) ×2 IMPLANT
BNDG CMPR 9X4 STRL LF SNTH (GAUZE/BANDAGES/DRESSINGS) ×1
BNDG COHESIVE 4X5 TAN STRL (GAUZE/BANDAGES/DRESSINGS) ×2 IMPLANT
BNDG ESMARK 4X9 LF (GAUZE/BANDAGES/DRESSINGS) ×2 IMPLANT
CHLORAPREP W/TINT 26ML (MISCELLANEOUS) ×2 IMPLANT
COVER MAYO STAND STRL (DRAPES) ×2 IMPLANT
COVER TABLE BACK 60X90 (DRAPES) ×2 IMPLANT
CUFF TOURNIQUET SINGLE 18IN (TOURNIQUET CUFF) IMPLANT
DRAIN TLS ROUND 10FR (DRAIN) ×2 IMPLANT
DRAPE EXTREMITY T 121X128X90 (DRAPE) ×2 IMPLANT
DRAPE SURG 17X23 STRL (DRAPES) ×2 IMPLANT
DRSG EMULSION OIL 3X3 NADH (GAUZE/BANDAGES/DRESSINGS) ×2 IMPLANT
GLOVE BIO SURGEON STRL SZ7.5 (GLOVE) ×2 IMPLANT
GLOVE BIOGEL PI IND STRL 8 (GLOVE) ×1 IMPLANT
GLOVE BIOGEL PI INDICATOR 8 (GLOVE) ×1
GOWN PREVENTION PLUS XLARGE (GOWN DISPOSABLE) ×4 IMPLANT
NDL HYPO 25X1 1.5 SAFETY (NEEDLE) IMPLANT
NEEDLE HYPO 25X1 1.5 SAFETY (NEEDLE) IMPLANT
NS IRRIG 1000ML POUR BTL (IV SOLUTION) ×2 IMPLANT
PACK BASIN DAY SURGERY FS (CUSTOM PROCEDURE TRAY) ×2 IMPLANT
PADDING CAST ABS 4INX4YD NS (CAST SUPPLIES)
PADDING CAST ABS COTTON 4X4 ST (CAST SUPPLIES) IMPLANT
SPONGE GAUZE 4X4 12PLY (GAUZE/BANDAGES/DRESSINGS) ×2 IMPLANT
STOCKINETTE 4X48 STRL (DRAPES) ×2 IMPLANT
SUT VICRYL RAPIDE 4/0 PS 2 (SUTURE) ×2 IMPLANT
SYR BULB 3OZ (MISCELLANEOUS) ×2 IMPLANT
SYRINGE 10CC LL (SYRINGE) IMPLANT
TOWEL OR 17X24 6PK STRL BLUE (TOWEL DISPOSABLE) ×4 IMPLANT
TOWEL OR NON WOVEN STRL DISP B (DISPOSABLE) ×2 IMPLANT
UNDERPAD 30X30 INCONTINENT (UNDERPADS AND DIAPERS) ×2 IMPLANT

## 2012-10-02 NOTE — Transfer of Care (Signed)
Immediate Anesthesia Transfer of Care Note  Patient: Ana Long  Procedure(s) Performed: Procedure(s): RIGHT CARPAL TUNNEL RELEASE ENDOSCOPIC (Right)  Patient Location: PACU  Anesthesia Type:MAC  Level of Consciousness: awake, alert , oriented and patient cooperative  Airway & Oxygen Therapy: Patient Spontanous Breathing and Patient connected to face mask oxygen  Post-op Assessment: Report given to PACU RN and Post -op Vital signs reviewed and stable  Post vital signs: Reviewed and stable  Complications: No apparent anesthesia complications

## 2012-10-02 NOTE — Op Note (Signed)
10/02/2012  8:19 AM  PATIENT:  Ana Long  37 y.o. female  PRE-OPERATIVE DIAGNOSIS:  Right carpal tunnel syndrome  POST-OPERATIVE DIAGNOSIS:  Same  PROCEDURE:  Right endoscopic carpal tunnel release the 2 incision technique  SURGEON: Cliffton Asters. Janee Morn, MD  PHYSICIAN ASSISTANT: None  ANESTHESIA:  local and MAC  SPECIMENS:  None  DRAINS:   TLS x1  PREOPERATIVE INDICATIONS:  Ana Long is a  37 y.o. female with a diagnosis of right carpal tunnel syndrome who failed conservative measures and elected for surgical management.    The risks benefits and alternatives were discussed with the patient preoperatively including but not limited to the risks of infection, bleeding, nerve injury, cardiopulmonary complications, the need for revision surgery, among others, and the patient verbalized understanding and consented to proceed.  OPERATIVE IMPLANTS: None  OPERATIVE FINDINGS: Very tight canal, copious space following release  OPERATIVE PROCEDURE:  After receiving prophylactic antibiotics, the patient was escorted to the operative theatre and placed in a supine position.  A surgical "time-out" was performed during which the planned procedure, proposed operative site, and the correct patient identity were compared to the operative consent and agreement confirmed by the circulating nurse according to current facility policy. The planned surgical sites were marked and anesthetized with a mixture of lidocaine and Marcaine bearing epinephrine.  Following application of a tourniquet to the operative extremity, the exposed skin was prepped with Chloraprep and draped in the usual sterile fashion.  The limb was exsanguinated with an Esmarch bandage and the tourniquet inflated to approximately higher than systolic BP.  The incisions were made sharply with a scalpel. Subcutaneous tissues were dissected with during dissection. The deep forearm fascia was split in line with  the skin incision proximally and the palmar fascia split distally in line with the skin incision revealing the underlying superficial palmar arch. Synovial reflector was introduced proximally to reflect synovium from the deep surface the transverse carpal ligament and then the slotted cannula with Dr. was passed from proximal to distal exiting the distal and superficial to the superficial palmar arch. This case was performed with 4 power loupe magnification. The scope was inserted proximally and visualization of the ligament was excellent. The triangle blade was introduced distally to the midportion of the ligament and used to create a perforation in the midportion. This was removed and followed by placement of the instrument which was placed into the perforation and withdrawn distally this completed the distal hemi-transection of the ligament. This instrument and the scope were reversed in orientation initially was placed into the apex of the which informed to the distal hemi-transection. It was then withdrawn proximally completely transected ligament. Subcutaneous fat drop down into the cannula throughout the extent of the release indicating good release. Endoscopic this was removed. The adequacy of the release was judged the proximal perspective with direct visualization. The form fashion was split for 2 inches proximal to the proximal incision using a sliding scissor technique. Tourniquet was released the wound is copiously irrigated and additional hemostasis wasn't necessary. The TLS drain was placed to lie alongside the nerve exiting from skin hole distally and then the incisions were closed with 4-0 Vicryl Rapide sutures, proximal buried and the distal were simple and superficial.    A bulky hand dressing was applied and the balance of 10 mL anesthetic mixture was placed down the drain the drain was clamped implants in her room.  DISPOSITION: Patient will be discharged home today returning in 10-15  days  for reassessment.

## 2012-10-02 NOTE — Anesthesia Preprocedure Evaluation (Addendum)
Anesthesia Evaluation  Patient identified by MRN, date of birth, ID band Patient awake    Reviewed: Allergy & Precautions, H&P , NPO status , Patient's Chart, lab work & pertinent test results, reviewed documented beta blocker date and time   Airway Mallampati: II TM Distance: >3 FB Neck ROM: full    Dental   Pulmonary asthma , sleep apnea ,  breath sounds clear to auscultation        Cardiovascular negative cardio ROS  Rhythm:regular     Neuro/Psych  Neuromuscular disease negative neurological ROS  negative psych ROS   GI/Hepatic negative GI ROS, Neg liver ROS, GERD-  Medicated,  Endo/Other  diabetes, Type 2, Insulin DependentMorbid obesity  Renal/GU Renal InsufficiencyRenal disease  negative genitourinary   Musculoskeletal   Abdominal   Peds  Hematology negative hematology ROS (+)   Anesthesia Other Findings See surgeon's H&P   Reproductive/Obstetrics negative OB ROS                           Anesthesia Physical Anesthesia Plan  ASA: III  Anesthesia Plan: MAC   Post-op Pain Management:    Induction: Intravenous  Airway Management Planned: Simple Face Mask  Additional Equipment:   Intra-op Plan:   Post-operative Plan:   Informed Consent: I have reviewed the patients History and Physical, chart, labs and discussed the procedure including the risks, benefits and alternatives for the proposed anesthesia with the patient or authorized representative who has indicated his/her understanding and acceptance.   Dental Advisory Given  Plan Discussed with: CRNA and Surgeon  Anesthesia Plan Comments: (Patient wishes to proceed without pregnancy testing which was offered. Risks discussed. CF)      Anesthesia Quick Evaluation

## 2012-10-02 NOTE — H&P (Signed)
Ana Long is an 37 y.o. female.   CC / Reason for Visit: Right hand dysfunction HPI: This patient presents for reevaluation.  She is taking gabapentin which has not seemed to alleviate any symptoms.  She reports a right wrist somewhere popped yesterday and she continues to have pain and swelling in the wrist, particularly on the dorsal ulnar aspect.  Tramadol apparently is not helping.  She also started ibuprofen thousand milligrams 3 times a day 3 days ago.  She has a family history of gout, but denies any known personal history of it.  She continues to have numbness and tingling in the median distribution in addition to the ulnar-sided wrist pain. Presenting history follows: This patient is a 37 year old female who presents for evaluation of right hand problems.  She reports that she is dating back as far as 1998 on her intake form today.  She reports that several years ago she had numbness in the right hand and that she receive a recommendation for carpal tunnel release surgery at that time.  She underwent nerve conduction studies on 03-23-12, performed by Dr. 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  She reports that she drops objects on the right side and cannot hold a phone for long without her fingers falling asleep.  In addition however she has sensitivity to light touch over the dorsal and ulnar aspect of the distal forearm, wrist, and hand.  She is an insulin-requiring diabetic.  Past Medical History  Diagnosis Date  . GERD (gastroesophageal reflux disease)   . Diabetes mellitus     intolerant to Metformin  . Herpes genital   . PCOS (polycystic ovarian syndrome)   . Morbid obesity with BMI of 50 to 60 07/23/2009  . Dry skin     hands   . Asthma     prn inhaler  . Arthritis     major joints  . Complication of anesthesia     states is hard to wake up post-op  . Family history of anesthesia  complication     mother went into coma during c-section  . Sleep apnea, obstructive     uses CPAP nightly - had sleep study 08/22/2007  . Carpal tunnel syndrome of right wrist 09/2012    Past Surgical History  Procedure Laterality Date  . Hysteroscopy w/d&c  01-08-2002    Family History  Problem Relation Age of Onset  . Other Mother    Social History:  reports that she quit smoking about 2 years ago. She has never used smokeless tobacco. She reports that  drinks alcohol. She reports that she does not use illicit drugs.  Allergies:  Allergies  Allergen Reactions  . Citrus Hives and Itching  . Latex Swelling  . Penicillins Hives and Itching  . Soap Hives, Itching and Swelling    PUREX LAUNDRY DETERGENT  . Tomato Hives and Itching  . Doxycycline Other (See Comments)    AFFECTS JOINTS  . Metformin And Related Rash    GI UPSET    Medications Prior to Admission  Medication Sig Dispense Refill  . acyclovir (ZOVIRAX) 400 MG tablet Take 1 tablet (400 mg total) by mouth 2 (two) times daily.  60 tablet  3  . albuterol (VENTOLIN HFA) 108 (90 BASE) MCG/ACT inhaler Inhale 1-2 puffs into the lungs every 4 (four) hours as needed for wheezing or shortness of breath.  1 Inhaler  11  . famotidine (  PEPCID) 40 MG tablet Take 40 mg by mouth daily.      Marland Kitchen glipiZIDE (GLUCOTROL) 5 MG tablet TAKE ONE TABLET BY MOUTH TWICE DAILY  60 tablet  11  . glucosamine-chondroitin 500-400 MG tablet Take 1 tablet by mouth 3 (three) times daily.      Marland Kitchen ibuprofen (ADVIL,MOTRIN) 200 MG tablet Take 1-4 tablets (200-800 mg total) by mouth every 8 (eight) hours as needed for pain.      Marland Kitchen oxyCODONE-acetaminophen (PERCOCET/ROXICET) 5-325 MG per tablet Take 1 tablet by mouth every 4 (four) hours as needed for pain.      . Probiotic Product (PROBIOTIC DAILY PO) Take by mouth.      . traMADol (ULTRAM) 50 MG tablet Take 50 mg by mouth at bedtime as needed for pain.      Marland Kitchen ACCU-CHEK FASTCLIX LANCETS MISC 1 each by Other route  2 (two) times daily.  102 each  5  . glucose blood (ACCU-CHEK SMARTVIEW) test strip 1 each by Other route 2 (two) times daily.  100 each  5  . insulin glargine (LANTUS) 100 UNIT/ML injection INJECT 92 UNITS INTO THE ABDOMEN AT BEDTIME  10 mL  10    No results found for this or any previous visit (from the past 48 hour(s)). No results found.  Review of Systems  All other systems reviewed and are negative.    Height 5\' 2"  (1.575 m), weight 135.898 kg (299 lb 9.6 oz), last menstrual period 08/25/2012. Physical Exam  Constitutional:  WD, WN, NAD HEENT:  NCAT, EOMI Neuro/Psych:  Alert & oriented to person, place, and time; appropriate mood & affect Lymphatic: No generalized UE edema or lymphadenopathy Extremities / MSK:  Both UE are normal with respect to appearance, ranges of motion, joint stability, muscle strength/tone, sensation, & perfusion except as otherwise noted:  There is a lot of sensitivity to light touch on the dorsal ulnar aspect of the distal forearm, wrist, and hand.  It really borders on allodynia.  There is no hyperhidrosis and no trophic skin changes.  Wrist motion is reasonably good.  No pain with TMC stress/grind testing.  It is difficult to palpate the ulnar aspect of the wrist for pain due to the sensitivity even to touch.  She can detect a 3.6 1 monofilament across all digits on the right side and has altered sensibility without provocation.  Small finger is spared subjectively.  Labs / X-rays: 3 views of the right wrist ordered and obtained today reveals no fractures, dislocations, or bony destructive lesions.  Assessment:  Right carpal tunnel syndrome, but also with dorsal ulnar skin hypersensitivity and underlying swelling  Plan:  We discussed how to proceed.  She was fitted with a wrist splint and we will arrange to perform an endoscopic carpal tunnel release next week.  She understands that some of her symptoms may not be related to the median nerve compression  and we can reassess this postoperatively.  The details of the operative procedure were discussed with the patient.  Questions were invited and answered.  In addition to the goal of the procedure, the risks of the procedure to include but not limited to bleeding; infection; damage to the nerves or blood vessels that could result in bleeding, numbness, weakness, chronic pain, and the need for additional procedures; stiffness; the need for revision surgery; and anesthetic risks, the worst of which is death, were reviewed.  No specific outcome was guaranteed or implied.  Informed consent was obtained.  Prescriptions for  postoperative analgesia were also written.  A return appointment 10-15 days postop was made.  Tishara Pizano A. 10/02/2012, 6:17 AM

## 2012-10-02 NOTE — Interval H&P Note (Signed)
History and Physical Interval Note:  10/02/2012 7:46 AM  Ana Long  has presented today for surgery, with the diagnosis of Right carpal tunnel syndrome  The various methods of treatment have been discussed with the patient and family. After consideration of risks, benefits and other options for treatment, the patient has consented to  Procedure(s): RIGHT CARPAL TUNNEL RELEASE ENDOSCOPIC (Right) as a surgical intervention .  The patient's history has been reviewed, patient examined, no change in status, stable for surgery.  I have reviewed the patient's chart and labs.  Questions were answered to the patient's satisfaction.     [name] A.

## 2012-10-02 NOTE — Anesthesia Postprocedure Evaluation (Signed)
Anesthesia Post Note  Patient: Ana Long  Procedure(s) Performed: Procedure(s) (LRB): RIGHT CARPAL TUNNEL RELEASE ENDOSCOPIC (Right)  Anesthesia type: General  Patient location: PACU  Post pain: Pain level controlled  Post assessment: Patient's Cardiovascular Status Stable  Last Vitals:  Filed Vitals:   10/02/12 0845  BP: 128/61  Pulse: 95  Temp:   Resp: 24    Post vital signs: Reviewed and stable  Level of consciousness: alert  Complications: No apparent anesthesia complications

## 2012-10-03 ENCOUNTER — Encounter (HOSPITAL_BASED_OUTPATIENT_CLINIC_OR_DEPARTMENT_OTHER): Payer: Self-pay | Admitting: Orthopedic Surgery

## 2012-10-18 ENCOUNTER — Encounter: Payer: Self-pay | Admitting: Internal Medicine

## 2012-10-18 ENCOUNTER — Ambulatory Visit (INDEPENDENT_AMBULATORY_CARE_PROVIDER_SITE_OTHER): Payer: Medicaid Other | Admitting: Internal Medicine

## 2012-10-18 VITALS — BP 104/67 | HR 94 | Temp 98.9°F | Ht 62.0 in | Wt 305.5 lb

## 2012-10-18 DIAGNOSIS — R609 Edema, unspecified: Secondary | ICD-10-CM

## 2012-10-18 DIAGNOSIS — R600 Localized edema: Secondary | ICD-10-CM

## 2012-10-18 DIAGNOSIS — E1165 Type 2 diabetes mellitus with hyperglycemia: Secondary | ICD-10-CM

## 2012-10-18 DIAGNOSIS — G4710 Hypersomnia, unspecified: Secondary | ICD-10-CM

## 2012-10-18 DIAGNOSIS — IMO0002 Reserved for concepts with insufficient information to code with codable children: Secondary | ICD-10-CM

## 2012-10-18 DIAGNOSIS — J45901 Unspecified asthma with (acute) exacerbation: Secondary | ICD-10-CM

## 2012-10-18 DIAGNOSIS — J45909 Unspecified asthma, uncomplicated: Secondary | ICD-10-CM

## 2012-10-18 HISTORY — DX: Localized edema: R60.0

## 2012-10-18 LAB — GLUCOSE, CAPILLARY: Glucose-Capillary: 144 mg/dL — ABNORMAL HIGH (ref 70–99)

## 2012-10-18 MED ORDER — FLUTICASONE-SALMETEROL 100-50 MCG/DOSE IN AEPB: 1.0000 | INHALATION_SPRAY | Freq: Two times a day (BID) | RESPIRATORY_TRACT | Status: DC

## 2012-10-18 MED ORDER — LORATADINE 10 MG PO TABS: 10.0000 mg | ORAL_TABLET | Freq: Every day | ORAL | Status: DC

## 2012-10-18 MED ORDER — INSULIN GLARGINE 100 UNIT/ML ~~LOC~~ SOLN: SUBCUTANEOUS | Status: DC

## 2012-10-18 NOTE — Progress Notes (Signed)
  Subjective:    Patient ID: Ana Long, female    DOB: [birth year], 37 y.o.   MRN: [id number]  HPI Comments: Ana Long is a 37 year old female with a PMH of asthma, OSA, DM type 2 and GERD presenting with a 1 week history of dyspnea, particularly at night.  She has been experiencing coughing, itchy/watery eyes, rhinorrhea, R sided nasal congestion for the past week as well.  She denies fever, sore throat, sick contacts or tobacco use.  She has tried loratidine with some relief.  Antihistamine has not worked at all.  She does not usually need to use her albuterol inhaler but she has increased the frequency to three times in the past week.  She reports 3 pillow orthopnea which she attributes to asthma and a poorly-functioning CPAP machine.  Asthma She complains of cough and shortness of breath. Associated symptoms include rhinorrhea and sneezing. Pertinent negatives include no appetite change, chest pain or fever. Her past medical history is significant for asthma.     Review of Systems  Constitutional: Positive for fatigue. Negative for fever and appetite change.  HENT: Positive for congestion, rhinorrhea and sneezing.   Eyes: Positive for itching.  Respiratory: Positive for cough, choking and shortness of breath.   Cardiovascular: Negative for chest pain.  Gastrointestinal: Negative for diarrhea and constipation.       Objective:   Physical Exam  Constitutional: She is oriented to person, place, and time. She appears well-nourished. No distress.  HENT:  Head: Normocephalic and atraumatic.  Mouth/Throat: Oropharynx is clear and moist. No oropharyngeal exudate.  Eyes: EOM are normal. Pupils are equal, round, and reactive to light. Right eye exhibits no discharge. Left eye exhibits no discharge. No scleral icterus.  Neck: Neck supple.  Cardiovascular: Normal rate and regular rhythm.  Exam reveals no gallop.   No murmur heard. Pulmonary/Chest: Effort normal and breath  sounds normal. No respiratory distress. She has no wheezes. She has no rales.  Abdominal: Soft. Bowel sounds are normal. She exhibits no distension. There is no tenderness.  Musculoskeletal: Normal range of motion. She exhibits no tenderness.  2+ pretibial pitting edema  Lymphadenopathy:    She has no cervical adenopathy.  Neurological: She is alert and oriented to person, place, and time.  Skin: Skin is dry. She is not diaphoretic.  Psychiatric: She has a normal mood and affect. Her behavior is normal.          Assessment & Plan:

## 2012-10-18 NOTE — Assessment & Plan Note (Addendum)
He dyspnea and increased inhaler use are consistent with acute asthma exacerbation.  This was likely triggered by an environmental allergen (i.e. Pollen) as she has been experiencing allergy symptoms.  URI less likely as she does not have cold symptoms.  She exhibits no accessory respiratory muscle use or cyanosis, her lungs are clear and her SpO2 is 95% on room air.  She is able to speak in full sentences without becoming dyspneic.  Since she has had to increase use of albuterol to 3 times in the past week I will prescribe Advair diskus for use q12h use.  I will prescribe Claritin 10mg  daily for allergy symptoms.  Patient will return to clinic in 1-2 weeks for follow-up.

## 2012-10-18 NOTE — Patient Instructions (Signed)
General Instructions: 1. Please return to clinic in 1-2 weeks.  2. Please take all medications as prescribed.   3. If you have worsening of your symptoms or new symptoms arise, please call the clinic ([phone]), or go to the ER immediately if symptoms are severe.   Treatment Goals:  Goals (1 Years of Data) as of 10/18/12         As of Today 10/02/12 10/02/12 10/02/12 10/02/12     Blood Pressure    . Blood Pressure < 130/80  104/67 142/79 128/61 116/67 128/91     Result Component    . HEMOGLOBIN A1C < 7.0          . LDL CALC < 100            Progress Toward Treatment Goals:  Treatment Goal 10/18/2012  Hemoglobin A1C improved    Self Care Goals & Plans:  Self Care Goal 10/18/2012  Manage my medications take my medicines as prescribed; bring my medications to every visit; refill my medications on time  Monitor my health -  Eat healthy foods drink diet soda or water instead of juice or soda; eat more vegetables; eat foods that are low in salt; eat baked foods instead of fried foods  Be physically active -  Meeting treatment goals -    Home Blood Glucose Monitoring 09/20/2012  Check my blood sugar (No Data)  When to check my blood sugar after breakfast; after lunch; after dinner; before breakfast     Care Management & Community Referrals:  Referral 10/18/2012  Referrals made for care management support none needed  Referrals made to community resources none    Asthma, Adult Asthma is a condition that affects your lungs. It is characterized by swelling and narrowing of your airways as well as increased mucus production. The narrowing comes from swelling and muscle spasms inside the airways. When this happens, breathing can be difficult and you can have coughing, wheezing, and shortness of breath. Knowing more about asthma can help you manage it better. Asthma cannot be cured, but medicines and lifestyle changes can help control it. Asthma can be a minor problem for some people but  if it is not controlled it can lead to a life-threatening asthma attack. Asthma can change over time. It is important to work with your caregiver to manage your asthma symptoms. CAUSES The exact cause of asthma is unknown. Asthma is believed to be caused by inherited (genetic) and environmental exposures. Swelling and redness (inflammation) of the airways occurs in asthma. This can be triggered by allergies, viral lung infections, or irritants in the air. Allergic reactions can cause you to wheeze immediately or several hours after an exposure. Asthma triggers are different for each person. It is important to pay attention and know what triggers your asthma.  Common triggers for asthma attacks include:  Animal dander from the skin, hair, or feathers of animals.  Dust mites contained in house dust.  Cockroaches.  Pollen from trees or grass.  Mold.  Cigarette or tobacco smoke. Smoking cannot be allowed in homes of people with asthma. People with asthma should not smoke and should not be around smokers.  Air pollutants such as dust, household cleaners, hair sprays, aerosol sprays, paint fumes, strong chemicals, or strong odors.  Cold air or weather changes. Cold air may cause inflammation. Winds increase molds and pollens in the air. There is not one best climate for people with asthma.  Strong emotions such as crying or laughing  hard.  Stress.  Certain medicines such as aspirin or beta-blockers.  Sulfites in such foods and drinks as dried fruits and wine.  Infections or inflammatory conditions such as the flu, a cold, or an inflammation of the nasal membranes (rhinitis).  Gastroesophageal reflux disease (GERD). GERD is a condition where stomach acid backs up into your throat (esophagus).  Exercise or strenous activity. Proper pre-exercise medicines allow most people to participate in sports. SYMPTOMS  Feeling short of breath.  Chest tightness or pain.  Difficulty sleeping due to  coughing, wheezing, or feeling short of breath.  A whistling or wheezing sound with exhalation.  Coughing or wheezing that is worse when you:  Have a virus (such as a cold or the flu).  Are suffering from allergies.  Are exposed to certain fumes or chemicals.  Exercise. Signs that your asthma is probably getting worse include:   More frequent and bothersome asthma signs and symptoms.  Increasing difficulty breathing. This can be measured by a peak flow meter, which is a simple device used to check how well your lungs are working.  An increasingly frequent need to use a quick-relief inhaler. DIAGNOSIS  The diagnosis of asthma is made by review of your medical history, a physical exam, and possibly from other tests. Lung function studies may help with the diagnosis. TREATMENT  Asthma cannot be cured. However, for the majority of adults, asthma can be controlled with treatment. Besides avoidance of triggers of your asthma, medicines are often required. There are 2 classes of medicine used for asthma treatment: controller medicines (reduce inflammation and symptoms) andreliever or rescue medicines (relieve asthma symptoms during acute attacks). You may require daily medicines to control your asthma. The most effective long-term controller medicines for asthma are inhaled corticosteroids (blocks inflammation). Other long-term control medicines include:  Leukotriene receptor antagonists (blocks a pathway of inflammation).  Long-acting beta2-agonists (relaxes the muscles of the airways for at least 12 hours) with an inhaled corticosteroid.  Cromolyn sodium or nedocromil (alters certain inflammatory cells' ability to release chemicals that cause inflammation).  Immunomodulators (alters the immune system to prevent asthma symptoms).  Theophylline (relaxes muscles in the airways). You may also require a short-acting beta2-agonist to relieve asthma symptoms during an acute attack. You should  understand what to do during an acute attack. Inhaled medicines are effective when used properly. Read the instructions on how to use your medicines correctly and speak to your caregiver if you have questions. Follow up with your caregiver on a regular basis to make sure your asthma is well-controlled. If your asthma is not well-controlled, if you have been hospitalized for asthma, or if multiple medicines or medium to high doses of inhaled corticosteroids are needed to control your asthma, request a referral to an asthma specialist. HOME CARE INSTRUCTIONS   Take medicines as directed by your caregiver.  Control your home environment in the following ways to help prevent asthma attacks:  Change your heating and air conditioning filter at least once a month.  Place a filter or cheesecloth over your heating and air conditioning vents.  Limit the use of fireplaces and wood stoves.  Do not smoke. Do not stay in places where others are smoking.  Get rid of pests (such as roaches and mice) and their droppings.  If you see mold on a plant, throw it away.  Clean your floors and dust every week. Use unscented cleaning products. Use a vacuum cleaner with a HEPA filter if possible. If vacuuming or cleaning  triggers your asthma, try to find someone else to do these chores.  Floors in your house should be wood, tile, or vinyl. Carpet can trap dander and dust.  Use allergy-proof pillows, mattress covers, and box spring covers.  Wash bedsheets and blankets every week in hot water and dry in a dryer.  Use a blanket that is made of polyester or cotton with a tight nap.  Do not use a dust ruffle on your bed.  Clean bathrooms and kitchens with bleach and repaint with mold-resistant paint.  Wash hands frequently.  Talk to your caregiver about an action plan for managing asthma attacks. This includes the use of a peak flow meter which measures the severity of the attack and medicines that can help stop  the attack. An action plan can help minimize or stop the attack without having to seek medical care.  Remain calm during an asthma attack.  Always have a plan prepared for seeking medical attention. This should include contacting your caregiver and in the case of a severe attack, calling your local emergency services (911 in [place] SEEK MEDICAL CARE IF:   You have wheezing, shortness of breath, or a cough even if taking medicine to prevent attacks.  You have thickening of sputum.  Your sputum changes from clear or white to yellow, green, gray, or bloody.  You have any problems that may be related to the medicines you are taking (such as a rash, itching, swelling, or trouble breathing).  You are using a reliever medicine more than 2 3 times per week.  Your peak flow is still at 50 79% of personal best after following your action plan for 1 hour. SEEK IMMEDIATE MEDICAL CARE IF:   You are short of breath even at rest.  You get short of breath when doing very little physical activity.  You have difficulty eating, drinking, or talking due to asthma symptoms.  You have chest pain or you feel that your heart is beating fast.  You have a bluish color to your lips or fingernails.  You are lightheaded, dizzy, or faint.  You have a fever or persistent symptoms for more than 2 3 days.  You have a fever and symptoms suddenly get worse.  You seem to be getting worse and are unresponsive to treatment during an asthma attack.  Your peak flow is less than 50% of personal best. MAKE SURE YOU:   Understand these instructions.  Will watch your condition.  Will get help right away if you are not doing well or get worse. Document Released: 02/08/2005 Document Revised: 01/26/2012 Document Reviewed: 09/27/2007 [facility] Patient Information 2014 [place], [place].

## 2012-10-18 NOTE — Assessment & Plan Note (Addendum)
The patient has 2+ pitting edema that was not present on exam 3 weeks ago.  She reports nocturnal dyspnea and 3 pillow orthopnea which may be attributable to her asthma exacerbation and OSA.  She has no cardiac history/hypertension or additional physical findings (-JVD, -extra heart sounds, -crackles) concerning for heart failure and she had a normal two view CXR in 07/2012.  However, given her other risk factors (obesity, diabetes) will follow-up in 1-2 weeks and if she continues to exhibit edema I will plan to obtain pro-BNP and ECHO.  Will have patient follow-up in clinic in 1-2 weeks to assess her dyspnea and edema.

## 2012-10-18 NOTE — Assessment & Plan Note (Signed)
The patient reports that her CPAP is no longer functioning.  However, she will need to have a new sleep study in order to obtain the needed replacement supplies.  Her nocturnal dyspnea is likely due to her asthma, OSA and poorly fitting CPAP.  Will refer to [facility] for sleep study so the patient can be fit for a new mask and obtain appropriate equipment.

## 2012-10-19 NOTE — Progress Notes (Signed)
I saw and evaluated the patient.  I personally confirmed the key portions of the history and exam documented by Dr. Wilson and I reviewed pertinent patient test results.  The assessment, diagnosis, and plan were formulated together and I agree with the documentation in the resident's note. 

## 2012-10-30 ENCOUNTER — Encounter: Payer: Self-pay | Admitting: Neurology

## 2012-10-30 ENCOUNTER — Ambulatory Visit (INDEPENDENT_AMBULATORY_CARE_PROVIDER_SITE_OTHER): Payer: Medicaid Other | Admitting: Neurology

## 2012-10-30 VITALS — BP 142/91 | HR 96 | Resp 18 | Ht 62.0 in | Wt 311.0 lb

## 2012-10-30 DIAGNOSIS — J441 Chronic obstructive pulmonary disease with (acute) exacerbation: Secondary | ICD-10-CM

## 2012-10-30 DIAGNOSIS — G4710 Hypersomnia, unspecified: Secondary | ICD-10-CM

## 2012-10-30 DIAGNOSIS — E662 Morbid (severe) obesity with alveolar hypoventilation: Secondary | ICD-10-CM

## 2012-10-30 HISTORY — DX: Morbid (severe) obesity with alveolar hypoventilation: E66.2

## 2012-10-30 NOTE — Progress Notes (Signed)
[facility]  Provider:  Melvyn Novas, M D  Referring Provider: Evelena Peat, DO Primary Care Physician:  Evelena Peat, DO  Chief Complaint  Patient presents with  . New Evaluation    hypersomnia, Andrey Campanile, rm 10    HPI:  Ana Long is a 37 y.o. female  Is seen here as a referral/ revisit  from Dr. Andrey Campanile  and Marilynn Rail  RRT at [facility].    [name] is a 37 year old African American female patient of Dr. Evelena Peat. She reports that she was diagnosed with obstructive sleep apnea in the year 2002. The diagnosis was made at [facility] and followed with the CPAP prescription she has not had any adjustments to her CPAP since 2009 but she had technical difficulties using the machine and by now it seems to have broken. She reports that the water reservoir has at times stopped functioning or became to hot,  and that now she feels that the pressure provided night by night is very variable.  She presented to [facility] and was sent here for a reevaluation.  The patient's bedtime is between 10:30 and 11 PM, it'll take her up to an hour and a half to fall asleep.  This is related to her shortness of breath. The patient may have one maximal to bathroom breaks at night she has to arise early at about 4:30 in the morning to get her children ready for school bus. She sleeps only 5 hours at night if at all.  Naps in daytime , gets back to bed at 7. 30 AM  and wakes again at 11 AM. Sleeps propped up on several pillows , other wise wakes SOB and coughing.      The patient reports that she has dyspnea since mid July 2014, she has experienced trouble coughing shortness of breath of breath, rhinorrhea.  She often has nasal congestion which could be allergic in origin. Orotidine have given her some relief. She has needed her albuterol inhaler 3 times in the past week.  She also reports that she sleeps on 3  pillows with orthopnea, which  was attributed by the patient and her primary care physician to her ongoing asthma and a poorly functioning CPAP machine. She often has vivid dreams.   She is a nonsmoker and is not exposed to passive smoke. The patient does not have an out of the home workplace. 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 She has no history of shift work.  Past medical history is positive for significant asthma and steroid treatments have led to further weight gain. In the current asthma visit he and pre-existing obstructive sleep apnea may have exacerbated one another. The patient also was referred for echocardiogram by her primary care physician or ankle edema has resolved since her primary care physician first noted this in August 2014.  The patient has several risk factors for sleep apnea and it is unlikely that apnea has resolved in the meantime. She has a history of nocturnal hypoxemia which was documented in 2002 study he also has asthma with at times acute exacerbations, lower extremity edema, diabetes mellitus poorly controlled according to her primary care physician. She remains hypersonic and has recently been more sleepy than upper.  She is also considered super rather  than morbidly obese,  with a BMI of 55.8 and has not been evaluated for obesity hypoventilation in the past.      Review of Systems: Out of a complete 14 system review, the patient complains of only the following symptoms, and all other reviewed systems are negative. Positive for weight gain, fatigue, shortness of breath, coughing, wheezing and snoring. There is also allergies and urination problems described the patient has diabetes and often troubled with insomnia especially to initiate sleep.  History   Social History  . Marital Status: Divorced    Spouse Name: N/A     Number of Children: 2  . Years of Education: 14   Occupational History  . UNEMPLOYED    Social History Main Topics  . Smoking status: Former Smoker -- 10 years    Quit date: 05/24/2010  . Smokeless tobacco: Never Used  . Alcohol Use: Yes     Comment: occasionally  . Drug Use: No  . Sexual Activity: Yes    Birth Control/ Protection: None     Comment: Lives w/a partner   Other Topics Concern  . Not on file   Social History Narrative   Single, but lives with a partner   Has 2 children and cares for an infant during the day          Family History  Problem Relation Age of Onset  . Other Mother   . Diabetes Maternal Aunt   . Heart attack Maternal Aunt   . Alzheimer's disease Maternal Uncle   . Cancer Maternal Uncle     prostate  . Diabetes Paternal Aunt   . Cancer Paternal Aunt     brain,mouth  . Alzheimer's disease Paternal Aunt   . Diabetes Paternal Uncle   . Diabetes Maternal Grandmother   . Diabetes Maternal Grandfather   . Leukemia Cousin   . Other Other   . Obesity Other   . Other Other     Past Medical History  Diagnosis Date  . GERD (gastroesophageal reflux disease)   . Diabetes mellitus     intolerant to Metformin  . Herpes genital   . PCOS (polycystic ovarian syndrome)   . Morbid obesity with BMI of 50 to 60 07/23/2009  . Dry skin     hands   . Asthma     prn inhaler  . Arthritis     major joints  . Complication of anesthesia     states is hard to wake up post-op  . Family history of anesthesia complication     mother went into coma during c-section  . Sleep apnea, obstructive     uses CPAP nightly - had sleep study 08/22/2007  . Carpal tunnel syndrome of right wrist 09/2012    Past Surgical History  Procedure Laterality Date  . Hysteroscopy w/d&c  01-08-2002  . Carpal tunnel release Right 10/02/2012    Procedure: RIGHT CARPAL TUNNEL RELEASE ENDOSCOPIC;  Surgeon: Jodi Marble, MD;  Location: [facility];  Service:  Orthopedics;  Laterality: Right;    Current Outpatient Prescriptions  Medication Sig Dispense Refill  . ACCU-CHEK FASTCLIX LANCETS MISC 1 each by Other route 2 (two) times daily.  102 each  5  . acyclovir (ZOVIRAX) 400 MG tablet Take 1 tablet (400 mg total) by mouth 2 (two) times daily.  60 tablet  3  . albuterol (VENTOLIN HFA) 108 (90 BASE) MCG/ACT inhaler Inhale 1-2 puffs into the lungs every 4 (four) hours as needed for  wheezing or shortness of breath.  1 Inhaler  11  . famotidine (PEPCID) 40 MG tablet Take 40 mg by mouth daily.      . Fluticasone-Salmeterol (ADVAIR DISKUS) 100-50 MCG/DOSE AEPB Inhale 1 puff into the lungs 2 (two) times daily.  1 each  1  . glipiZIDE (GLUCOTROL) 5 MG tablet TAKE ONE TABLET BY MOUTH TWICE DAILY  60 tablet  11  . glucosamine-chondroitin 500-400 MG tablet Take 1 tablet by mouth 3 (three) times daily.      Marland Kitchen glucose blood (ACCU-CHEK SMARTVIEW) test strip 1 each by Other route 2 (two) times daily.  100 each  5  . ibuprofen (ADVIL,MOTRIN) 200 MG tablet Take 1-4 tablets (200-800 mg total) by mouth every 8 (eight) hours as needed for pain.      Marland Kitchen insulin glargine (LANTUS) 100 UNIT/ML injection INJECT 92 UNITS INTO THE ABDOMEN AT BEDTIME  3 vial  3  . loratadine (CLARITIN) 10 MG tablet Take 1 tablet (10 mg total) by mouth daily.  30 tablet  1  . oxyCODONE-acetaminophen (PERCOCET/ROXICET) 5-325 MG per tablet Take 1 tablet by mouth every 4 (four) hours as needed for pain.      . Probiotic Product (PROBIOTIC DAILY PO) Take by mouth.      . traMADol (ULTRAM) 50 MG tablet Take 50 mg by mouth at bedtime as needed for pain.       No current facility-administered medications for this visit.    Allergies as of 10/30/2012 - Review Complete 10/30/2012  Allergen Reaction Noted  . Citrus Hives and Itching 07/20/2012  . Latex Swelling 07/20/2012  . Penicillins Hives and Itching 01/01/2006  . Soap Hives, Itching, and Swelling 07/20/2012  . Tomato Hives and Itching 07/20/2012   . Doxycycline Other (See Comments) 01/01/2006  . Metformin and related Rash 12/13/2010    Vitals: BP 142/91  Pulse 96  Resp 18  Ht 5\' 2"  (1.575 m)  Wt 311 lb (141.069 kg)  BMI 56.87 kg/m2 Last Weight:  Wt Readings from Last 1 Encounters:  10/30/12 311 lb (141.069 kg)   Last Height:   Ht Readings from Last 1 Encounters:  10/30/12 5\' 2"  (1.575 m)    Physical exam:  General: The patient is awake, alert and appears not in acute distress. The patient is well groomed. Head: Normocephalic, atraumatic. Neck is supple. Mallampati 3 - but large tonsils for an adult, lateral pillows and large tongue- all related to upper airway restriction by soft tissue.   , neck circumference: 22 inches, wheezing .  No TMJ, no retrognathia. Can breath through either nasion. Cardiovascular:  Regular rate and rhythm, without  murmurs or carotid bruit,-neck veins. Respiratory: Lungs wheezing to auscultation. Skin:  Without evidence of  Rash, today not edematous at the ankle.  Trunk: BMI is over 55 , morbidly elevated .  Neurologic exam : The patient is awake and alert, oriented to place and time.  Memory subjective  described as intact. There is a normal attention span & concentration ability. Speech is fluent without  dysarthria, dysphonia or aphasia. Speech fluency is fragmented by shortness of breath Mood and affect are appropriate- she attributes her lack of energy to the untreated apnea since CPAP is broken. .  Cranial nerves: Pupils are equal and briskly reactive to light. Funduscopic exam without  evidence of pallor or edema. Extraocular movements  in vertical and horizontal planes intact and without nystagmus. Visual fields by finger perimetry are intact. Hearing to finger rub intact.  Facial  sensation intact to fine touch. Facial motor strength is symmetric and tongue  midline.  Motor exam:   Normal tone and normal muscle bulk and symmetric normal strength in all extremities.  Sensory:  Fine  touch, pinprick and vibration were tested in all extremities. Proprioception is  normal.  Coordination: Rapid alternating movements in the fingers/hands is tested and normal.  Gait and station: Patient walks without assistive device-. Strength within normal limits, with restricted exercise tolerance by shortness of breath. Stance is stable and normal.Romberg testing is normal.  Deep tendon reflexes: in the  upper and lower extremities are symmetric and intact. Babinski maneuver  Equivocal/  downgoing.   Assessment:  After physical and neurologic examination, review of laboratory studies, imaging, neurophysiology testing and pre-existing records, assessment is that of untreated OSA , suspected obesity hypoventilation and possible CHF . Documented apnea in the past , asthma with exacerbation, wheezing is present.   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 She is a nonsmoker and is not exposed to passive smoke. The patient does not have an out of the home workplace. 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 She has no history of shift work.    This patient will need a split night polysomnography with CO2 measures. Begun either offer her a wedge or multiple pillows, as she is orthopneic at baseline. She is also tachycardic at baseline. And she is tachypneic at baseline. multimorbidity allows for SPLIT at AHI 10 and score at 4%.

## 2012-10-30 NOTE — Patient Instructions (Signed)
CPAP and BIPAP CPAP and BIPAP are methods of helping you breathe. CPAP stands for "continuous positive airway pressure." BIPAP stands for "bi-level positive airway pressure." Both CPAP and BIPAP are provided by a small machine with a flexible plastic tube that attaches to a plastic mask that goes over your nose or mouth. Air is blown into your air passages through your nose or mouth. This helps to keep your airways open and helps to keep you breathing well. The amount of pressure that is used to blow the air into your air passages can be set on the machine. The pressure setting is based on your needs. With CPAP, the amount of pressure stays the same while you breathe in and out. With BIPAP, the amount of pressure changes when you inhale and exhale. Your caregiver will recommend whether CPAP or BIPAP would be more helpful for you.  CPAP and BIPAP can be helpful for both adults and children with:  Sleep apnea.  Chronic Obstructive Pulmonary Disease (COPD), a condition like emphysema.  Diseases which weaken the muscles of the chest such as muscular dystrophy or neurological diseases.  Other problems that cause breathing to be weak or difficult. USE OF CPAP OR BIPAP The respiratory therapist or technician will help you get used to wearing the mask. Some people feel claustrophobic (a trapped or closed in feeling) at first, because the mask needs to be fairly snug on your face.   It may help you to get used to the mask gradually, by first holding the mask loosely over your nose or mouth using a low pressure setting on the machine. Gradually the mask can be applied more snugly with increased pressure. You can also gradually increase the amount of time the mask is used.  People with sleep apnea will use the mask and machine at night when they are sleeping. Others, like those with ALS or other breathing difficulties, may need the CPAP or BIPAP all the time.  If the first mask you try does not fit well, or  is uncomfortable, there are other types and sizes that can be tried.  If you tend to breathe through your mouth, a chin strap may be applied to help keep your mouth closed (if you are using a nasal mask).  The CPAP and BIPAP machines have alarms that may sound if the mask comes off or develops a leak.  You should not eat or drink while the CPAP or BIPAP is on. Food or fluids could get pushed into your lungs by the pressure of the CPAP or BIPAP. Sometimes CPAP or BIPAP machines are ordered for home use. If you are going to use the CPAP or BIPAP machine at home, follow these instructions  CPAP or BIPAP machines can be rented or purchased through home health care companies. There are many different brands of machines available. If you rent a machine before purchasing you may find which particular machine works well for you.  Ask questions if there is something you do not understand when picking out your machine.  Place your CPAP or BIPAP machine on a secure table or stand near an electrical outlet.  Know where the On/Off switch is.  Follow your doctor's instructions for how to set the pressure on your machine and when you should use it.  Do not smoke! Tobacco smoke residue can damage the machine. SEEK IMMEDIATE MEDICAL CARE IF:   You have redness or open areas around your nose or mouth.  You have trouble operating  the CPAP or BIPAP machine.  You cannot tolerate wearing the CPAP or BIPAP mask.  You have any questions or concerns. Document Released: 11/07/2003 Document Revised: 05/03/2011 Document Reviewed: 02/06/2008 [facility] Patient Information 2014 [place], [place]. Polysomnography (Sleep Studies) Polysomnography (PSG) is a series of tests used for detecting (diagnosing) obstructive sleep apnea and other sleep disorders. The tests measure how some parts of your body are working while you are sleeping. The tests are extensive and expensive. They are done in a sleep lab or hospital, and  vary from center to center. Your caregiver may perform other more simple sleep studies and questionnaires before doing more complete and involved testing. Testing may not be covered by insurance. Some of these tests are:  An EEG (Electroencephalogram). This tests your brain waves and stages of sleep.  An EOG (Electrooculogram). This measures the movements of your eyes. It detects periods of REM (rapid eye movement) sleep, which is your dream sleep.  An EKG (Electrocardiogram). This measures your heart rhythm.  EMG (Electromyography). This is a measurement of how the muscles are working in your upper airway and your legs while sleeping.  An oximetry measurement. It measures how much oxygen (air) you are getting while sleeping.  Breathing efforts may be measured. The same test can be interpreted (understood) differently by different caregivers and centers that study sleep.  Studies may be given an apnea/hypopnea index (AHI). This is a number which is found by counting the times of no breathing or under breathing during the night, and relating those numbers to the amount of time spent in bed. When the AHI is greater than 15, the patient is likely to complain of daytime sleepiness. When the AHI is greater than 30, the patient is at increased risk for heart problems and must be followed more closely. Following the AHI also allows you to know how treatment is working. Simple oximetry (tracking the amount of oxygen that is taken in) can be used for screening patients who:  Do not have symptoms (problems) of OSA.  Have a normal Epworth Sleepiness Scale Score.  Have a low pre-test probability of having OSA.  Have none of the upper airway problems likely to cause apnea.  Oximetry is also used to determine if treatment is effective in patients who showed significant desaturations (not getting enough oxygen) on their home sleep study. One extra measure of safety is to perform additional studies for  the person who only snores. This is because no one can predict with absolute certainty who will have OSA. Those who show significant desaturations (not getting enough oxygen) are recommended to have a more detailed sleep study. Document Released: 08/15/2002 Document Revised: 05/03/2011 Document Reviewed: 02/08/2005 [facility] Patient Information 2014 [place], [place]. Exercise to Lose Weight Exercise and a healthy diet may help you lose weight. Your doctor may suggest specific exercises. EXERCISE IDEAS AND TIPS  Choose low-cost things you enjoy doing, such as walking, bicycling, or exercising to workout videos.  Take stairs instead of the elevator.  Walk during your lunch break.  Park your car further away from work or school.  Go to a gym or an exercise class.  Start with 5 to 10 minutes of exercise each day. Build up to 30 minutes of exercise 4 to 6 days a week.  Wear shoes with good support and comfortable clothes.  Stretch before and after working out.  Work out until you breathe harder and your heart beats faster.  Drink extra water when you exercise.  Do  not do so much that you hurt yourself, feel dizzy, or get very short of breath. Exercises that burn about 150 calories:  Running 1  miles in 15 minutes.  Playing volleyball for 45 to 60 minutes.  Washing and waxing a car for 45 to 60 minutes.  Playing touch football for 45 minutes.  Walking 1  miles in 35 minutes.  Pushing a stroller 1  miles in 30 minutes.  Playing basketball for 30 minutes.  Raking leaves for 30 minutes.  Bicycling 5 miles in 30 minutes.  Walking 2 miles in 30 minutes.  Dancing for 30 minutes.  Shoveling snow for 15 minutes.  Swimming laps for 20 minutes.  Walking up stairs for 15 minutes.  Bicycling 4 miles in 15 minutes.  Gardening for 30 to 45 minutes.  Jumping rope for 15 minutes.  Washing windows or floors for 45 to 60 minutes. Document Released: 03/13/2010 Document  Revised: 05/03/2011 Document Reviewed: 03/13/2010 [facility] Patient Information 2014 [place], [place].

## 2012-11-17 ENCOUNTER — Ambulatory Visit (INDEPENDENT_AMBULATORY_CARE_PROVIDER_SITE_OTHER): Payer: Medicaid Other | Admitting: Neurology

## 2012-11-17 DIAGNOSIS — G4733 Obstructive sleep apnea (adult) (pediatric): Secondary | ICD-10-CM

## 2012-11-17 DIAGNOSIS — J441 Chronic obstructive pulmonary disease with (acute) exacerbation: Secondary | ICD-10-CM

## 2012-11-17 DIAGNOSIS — G4710 Hypersomnia, unspecified: Secondary | ICD-10-CM

## 2012-11-17 DIAGNOSIS — E662 Morbid (severe) obesity with alveolar hypoventilation: Secondary | ICD-10-CM

## 2012-11-22 ENCOUNTER — Ambulatory Visit (INDEPENDENT_AMBULATORY_CARE_PROVIDER_SITE_OTHER): Payer: Medicaid Other | Admitting: Internal Medicine

## 2012-11-22 ENCOUNTER — Encounter: Payer: Self-pay | Admitting: Internal Medicine

## 2012-11-22 VITALS — BP 140/79 | HR 86 | Temp 97.5°F | Ht 62.0 in | Wt 306.2 lb

## 2012-11-22 DIAGNOSIS — M545 Low back pain, unspecified: Secondary | ICD-10-CM

## 2012-11-22 DIAGNOSIS — IMO0002 Reserved for concepts with insufficient information to code with codable children: Secondary | ICD-10-CM

## 2012-11-22 DIAGNOSIS — N644 Mastodynia: Secondary | ICD-10-CM

## 2012-11-22 DIAGNOSIS — A088 Other specified intestinal infections: Secondary | ICD-10-CM

## 2012-11-22 DIAGNOSIS — A084 Viral intestinal infection, unspecified: Secondary | ICD-10-CM

## 2012-11-22 DIAGNOSIS — M546 Pain in thoracic spine: Secondary | ICD-10-CM

## 2012-11-22 DIAGNOSIS — E1165 Type 2 diabetes mellitus with hyperglycemia: Secondary | ICD-10-CM

## 2012-11-22 HISTORY — DX: Low back pain, unspecified: M54.50

## 2012-11-22 LAB — POCT GLYCOSYLATED HEMOGLOBIN (HGB A1C): Hemoglobin A1C: 7.8

## 2012-11-22 LAB — GLUCOSE, CAPILLARY: Glucose-Capillary: 124 mg/dL — ABNORMAL HIGH (ref 70–99)

## 2012-11-22 MED ORDER — IBUPROFEN 200 MG PO TABS: 800.0000 mg | ORAL_TABLET | Freq: Three times a day (TID) | ORAL | Status: DC | PRN

## 2012-11-22 NOTE — Progress Notes (Signed)
Patient ID: Ana Long, female   DOB: November 16, 1975, 37 y.o.   MRN: [id number]   Subjective:   HPI: [name] is a 37 y.o. woman with past medical history of morbid obesity, diabetes, asthma, obstructive sleep apnea among other medical problems, presents to the clinic for an acute visit with multiple complaints.  Diarrhea. Patient reports history of diarrhea over the last 4 days. In the beginning she was experiencing 10 episodes of watery stools per day for this has gradually improved to 3 episodes. She denies vomiting, but she's been having nausea. She also reports subjective chills without fever, dizziness, or change in her appetite. She reports some lower abdominal pain, which has also improved. She reports that the stool was watery, green, without blood. Was not foul smelling. No other family members had similar symptoms. She does not suspect any food poisoning as her meals have been unchanged. She reports that she gets her water from a well which is 6 feet deep.   Low back pain. Patient reports, that she's experienced increasing low back pain over the last 2-3 weeks. Denies history of trauma or falls prior to onset of pain. Pain is mainly in the lower section of her back, without neurological symptoms. She rates her pain as 7-8/10 and is increased by lying on the back and relieved by staying still. She's been using 1000 mg of ibuprofen every other night without significant relief. She does not take any other pain medications. Lumbar spine MRI in 2011 did not reveal gross lumbar spinal abnormalities but the study was limited due to large body habitus. She is worried about cancer in her back.   Increased urinary frequency. Patient reports, that she's been expressing increased urinary frequency with cessation of failure to empty her bladder completely. She denies dysuria. She denies hematuria. The symptoms have been present over the last several days.    Past Medical  History  Diagnosis Date  . GERD (gastroesophageal reflux disease)   . Diabetes mellitus     intolerant to Metformin  . Herpes genital   . PCOS (polycystic ovarian syndrome)   . Morbid obesity with BMI of 50 to 60 07/23/2009  . Dry skin     hands   . Asthma     prn inhaler  . Arthritis     major joints  . Complication of anesthesia     states is hard to wake up post-op  . Family history of anesthesia complication     mother went into coma during c-section  . Sleep apnea, obstructive     uses CPAP nightly - had sleep study 08/22/2007  . Carpal tunnel syndrome of right wrist 09/2012  . Obesity hypoventilation syndrome 10/30/2012   Current Outpatient Prescriptions  Medication Sig Dispense Refill  . ACCU-CHEK FASTCLIX LANCETS MISC 1 each by Other route 2 (two) times daily.  102 each  5  . acyclovir (ZOVIRAX) 400 MG tablet Take 1 tablet (400 mg total) by mouth 2 (two) times daily.  60 tablet  3  . albuterol (VENTOLIN HFA) 108 (90 BASE) MCG/ACT inhaler Inhale 1-2 puffs into the lungs every 4 (four) hours as needed for wheezing or shortness of breath.  1 Inhaler  11  . famotidine (PEPCID) 40 MG tablet Take 40 mg by mouth daily.      . Fluticasone-Salmeterol (ADVAIR DISKUS) 100-50 MCG/DOSE AEPB Inhale 1 puff into the lungs 2 (two) times daily.  1 each  1  . glipiZIDE (GLUCOTROL) 5 MG tablet  TAKE ONE TABLET BY MOUTH TWICE DAILY  60 tablet  11  . glucosamine-chondroitin 500-400 MG tablet Take 1 tablet by mouth 3 (three) times daily.      Marland Kitchen glucose blood (ACCU-CHEK SMARTVIEW) test strip 1 each by Other route 2 (two) times daily.  100 each  5  . ibuprofen (ADVIL,MOTRIN) 200 MG tablet Take 1-4 tablets (200-800 mg total) by mouth every 8 (eight) hours as needed for pain.      Marland Kitchen insulin glargine (LANTUS) 100 UNIT/ML injection INJECT 92 UNITS INTO THE ABDOMEN AT BEDTIME  3 vial  3  . loratadine (CLARITIN) 10 MG tablet Take 1 tablet (10 mg total) by mouth daily.  30 tablet  1  . Probiotic Product  (PROBIOTIC DAILY PO) Take by mouth.       No current facility-administered medications for this visit.   Family History  Problem Relation Age of Onset  . Other Mother   . Diabetes Maternal Aunt   . Heart attack Maternal Aunt   . Alzheimer's disease Maternal Uncle   . Cancer Maternal Uncle     prostate  . Diabetes Paternal Aunt   . Cancer Paternal Aunt     brain,mouth  . Alzheimer's disease Paternal Aunt   . Diabetes Paternal Uncle   . Diabetes Maternal Grandmother   . Diabetes Maternal Grandfather   . Leukemia Cousin   . Other Other   . Obesity Other   . Other Other    History   Social History  . Marital Status: Divorced    Spouse Name: N/A    Number of Children: 2  . Years of Education: 14   Occupational History  . UNEMPLOYED    Social History Main Topics  . Smoking status: Former Smoker -- 10 years    Quit date: 05/24/2010  . Smokeless tobacco: Never Used  . Alcohol Use: Yes     Comment: occasionally  . Drug Use: No  . Sexual Activity: Yes    Birth Control/ Protection: None     Comment: Lives w/a partner   Other Topics Concern  . None   Social History Narrative   Single, but lives with a partner   Has 2 children and cares for an infant during the day         Review of Systems: Constitutional: Denies fever, chills, diaphoresis, appetite change and fatigue.  Respiratory: Denies SOB, DOE, cough, chest tightness, and wheezing.  Cardiovascular: No chest pain, palpitations and leg swelling.  Musculoskeletal: No myalgias, back pain, joint swelling, arthralgias    Objective:  Physical Exam: Filed Vitals:   11/22/12 1335  BP: 140/79  Pulse: 86  Temp: 97.5 F (36.4 C)  TempSrc: Oral  Height: 5\' 2"  (1.575 m)  Weight: 306 lb 3.2 oz (138.891 kg)  SpO2: 95%   General: Morbidly obese woman. No acute distress. Son in exam room. Lungs: CTA bilaterally. Heart: RRR; no extra sounds or murmurs  Abdomen: Distended from obesity, normal BS, soft, nontender; no  hepatosplenomegaly. No CVA tenderness. Extremities: No pedal edema. No joint swelling or tenderness. Back: mid-line lower back tenderness in the lumber region. No paraspinal muscle tension. No lesions appreciated but exam is limited due to obesity. Neurologic: Alert and oriented x3. No obvious neurologic deficits.  Assessment & Plan:  I have discussed my assessment and plan  with Dr. Josem Kaufmann as detailed under problem based charting.

## 2012-11-22 NOTE — Assessment & Plan Note (Signed)
Clinical history and physical examination findings are more consistent with lumbar sacral strain/musculoskeletal pain. Her most predisposing factors morbid obesity with a BMI of more than 50. I do not appreciate any neurological deficits. Her reported urinary symptoms raises a possibility of urinary tract infection with pyelonephritis, but the urine dipstick was normal. She is afebrile, without costovertebral angle tenderness. Previous lumbar spinal MRI in 2011-no gross abnormality.  Plan. - Trial of an increased dose of ibuprofen-800 mg every 8 hours for the next 10-12 days. Take it with meals. - I also advised the patient to use home remedies, including warm back pads - Reassured the patient that this is unlikely to be cancer given the acute nature of her symptoms. - If the pain, persists, she'll call the clinic for further reevaluation.

## 2012-11-22 NOTE — Patient Instructions (Signed)
Take Ibuprofen 800 mg every 8 hrs for not more than 12 days. You can take an extra dose if you are in severe pain  Please take plenty of water  Make sure you boil your drinking water  You don't have an infection  Please continue with other medications as before Please come back to clinic if symptoms persist.     Lumbosacral Strain Lumbosacral strain is one of the most common causes of back pain. There are many causes of back pain. Most are not serious conditions. CAUSES  Your backbone (spinal column) is made up of 24 main vertebral bodies, the sacrum, and the coccyx. These are held together by muscles and tough, fibrous tissue (ligaments). Nerve roots pass through the openings between the vertebrae. A sudden move or injury to the back may cause injury to, or pressure on, these nerves. This may result in localized back pain or pain movement (radiation) into the buttocks, down the leg, and into the foot. Sharp, shooting pain from the buttock down the back of the leg (sciatica) is frequently associated with a ruptured (herniated) disk. Pain may be caused by muscle spasm alone. Your caregiver can often find the cause of your pain by the details of your symptoms and an exam. In some cases, you may need tests (such as X-rays). Your caregiver will work with you to decide if any tests are needed based on your specific exam. HOME CARE INSTRUCTIONS   Avoid an underactive lifestyle. Active exercise, as directed by your caregiver, is your greatest weapon against back pain.  Avoid hard physical activities (tennis, racquetball, waterskiing) if you are not in proper physical condition for it. This may aggravate or create problems.  If you have a back problem, avoid sports requiring sudden body movements. Swimming and walking are generally safer activities.  Maintain good posture.  Avoid becoming overweight (obese).  Use bed rest for only the most extreme, sudden (acute) episode. Your caregiver will help  you determine how much bed rest is necessary.  For acute conditions, you may put ice on the injured area.  Put ice in a plastic bag.  Place a towel between your skin and the bag.  Leave the ice on for 15-20 minutes at a time, every 2 hours, or as needed.  After you are improved and more active, it may help to apply heat for 30 minutes before activities. See your caregiver if you are having pain that lasts longer than expected. Your caregiver can advise appropriate exercises or therapy if needed. With conditioning, most back problems can be avoided. SEEK IMMEDIATE MEDICAL CARE IF:   You have numbness, tingling, weakness, or problems with the use of your arms or legs.  You experience severe back pain not relieved with medicines.  There is a change in bowel or bladder control.  You have increasing pain in any area of the body, including your belly (abdomen).  You notice shortness of breath, dizziness, or feel faint.  You feel sick to your stomach (nauseous), are throwing up (vomiting), or become sweaty.  You notice discoloration of your toes or legs, or your feet get very cold.  Your back pain is getting worse.  You have a fever. MAKE SURE YOU:   Understand these instructions.  Will watch your condition.  Will get help right away if you are not doing well or get worse. Document Released: 11/18/2004 Document Revised: 05/03/2011 Document Reviewed: 05/10/2008 [facility] Patient Information 2014 [place], [place].

## 2012-11-22 NOTE — Assessment & Plan Note (Signed)
Clinical history points towards a viral enteritis. Since symptoms are improving, reassured the patient to continue taking plenty of fluids, and if she notices blood or increased diarrhea to call clinic for reevaluation.

## 2012-11-22 NOTE — Progress Notes (Signed)
Case discussed with Dr. Kazibwe at time of visit. We reviewed the resident's history and exam and pertinent patient test results. I agree with the assessment, diagnosis, and plan of care documented in the resident's note. 

## 2012-11-28 ENCOUNTER — Encounter: Payer: Self-pay | Admitting: *Deleted

## 2012-11-28 ENCOUNTER — Telehealth: Payer: Self-pay | Admitting: Neurology

## 2012-11-28 DIAGNOSIS — G4733 Obstructive sleep apnea (adult) (pediatric): Secondary | ICD-10-CM

## 2012-11-28 DIAGNOSIS — E662 Morbid (severe) obesity with alveolar hypoventilation: Secondary | ICD-10-CM

## 2012-11-28 NOTE — Telephone Encounter (Signed)
I called and spoke with the patient concerning the results of her sleep study test. I informed the patient that her recent sleep study show severe obstructive sleep apnea and that Dr. Vickey Huger wants her to start CPAP therapy. Patient understood and would like the order to be placed with [facility].A copy of the sleep study interpretive report as well as a letter with info regarding contact info for the DME company, the importance of CPAP compliance, and the date of the follow up appointment info will be mailed to the patient's home. Patient is scheduled for 01-12-13 post CPAP with Dr. Vickey Huger.

## 2012-11-29 ENCOUNTER — Encounter: Payer: Self-pay | Admitting: Dietician

## 2012-11-29 ENCOUNTER — Ambulatory Visit (INDEPENDENT_AMBULATORY_CARE_PROVIDER_SITE_OTHER): Payer: Medicaid Other | Admitting: Dietician

## 2012-11-29 VITALS — Wt 302.6 lb

## 2012-11-29 DIAGNOSIS — E1165 Type 2 diabetes mellitus with hyperglycemia: Secondary | ICD-10-CM

## 2012-11-29 DIAGNOSIS — IMO0002 Reserved for concepts with insufficient information to code with codable children: Secondary | ICD-10-CM

## 2012-11-29 NOTE — Progress Notes (Signed)
Medical Nutrition Therapy:  Appt start time: 1340 end time:  1440.  Assessment:  Primary concerns today: Weight management and Blood sugar control Patient wants diet/ meal plan to help her son and herself detox. Asks about engine 2 diet, but when we printed it out,. She felt it was too complex.  Usual eating pattern includes 2 meals and 2 snacks per day. Avoids fast food.  Usual physical activity includes walking as much as tolerated. Frequent foods include fruits, nuts, vegetables, lean meats, dried fruit.  Avoided foods include citrus, tomato.   Labs and meds noted- A1C 7.8% meter download shows 9 blood sugars over past 30 days, average of 166 with range of 106 to 211    Progress Towards Goal(s):  In progress.   Nutritional Diagnosis:  NB-1.1 Food and nutrition-related knowledge deficit As related to lack of priro exposure to meal planning for weight loss/ diabetes.  As evidenced by her report and questions.    Intervention:  Nutrition education about balanced meal planning.  Nutrition education about hypoglycemia, prevention, treatment and how to self adjust insulin ( decrease dose by 2 units for a blood sugar < 70 mg/dl) Glucose tablets and educational sheet provided and patient encouraged to keep meter with her and record lows.   Monitoring/Evaluation:  Dietary intake, exercise, blood sugars, and body weight in 4 week(s).

## 2012-11-29 NOTE — Patient Instructions (Addendum)
Eat Each day:   2 cups of fruit  3 cups of vegetables   2 cups beans- chickpeas are a great snack or in salads  1 cup of nuts and seeds   Yogurt and grains for breakfast- shredded wheat, oatmeal, etc...  Breakfast- yogurt and cereal, can add fruit and nuts Lunch- 1.5 cups veggies              1/2 cup fruit               1 cup beans Dinner- 1.5 cups veggies              1/2 cup fruit               1 cup beans  Snacks- can use from any of the food groups above Beverages- water, tea- green tea is great, diet sugar drinks, coffee, milk,  or if regular sugar limit to 5 tsps a day  Remember what we talked about checking sugar is you feel low, decreasing your insulin by 2 units if your blood sugar is less than 70 mg/dl .

## 2012-12-05 NOTE — Progress Notes (Signed)
Pt was notified by Jasmine December of results and copies were sent to patient's home. -sh

## 2012-12-25 ENCOUNTER — Encounter: Payer: Medicaid Other | Admitting: Dietician

## 2012-12-25 ENCOUNTER — Ambulatory Visit: Payer: Medicaid Other | Admitting: Internal Medicine

## 2013-01-11 ENCOUNTER — Other Ambulatory Visit: Payer: Self-pay | Admitting: *Deleted

## 2013-01-11 ENCOUNTER — Ambulatory Visit: Payer: Medicaid Other | Admitting: Internal Medicine

## 2013-01-12 ENCOUNTER — Other Ambulatory Visit: Payer: Self-pay | Admitting: *Deleted

## 2013-01-12 ENCOUNTER — Encounter: Payer: Self-pay | Admitting: Neurology

## 2013-01-12 ENCOUNTER — Encounter (INDEPENDENT_AMBULATORY_CARE_PROVIDER_SITE_OTHER): Payer: Self-pay

## 2013-01-12 ENCOUNTER — Ambulatory Visit (INDEPENDENT_AMBULATORY_CARE_PROVIDER_SITE_OTHER): Payer: Medicaid Other | Admitting: Neurology

## 2013-01-12 VITALS — BP 123/74 | HR 88 | Resp 18 | Ht 62.0 in | Wt 311.0 lb

## 2013-01-12 DIAGNOSIS — G4733 Obstructive sleep apnea (adult) (pediatric): Secondary | ICD-10-CM

## 2013-01-12 MED ORDER — GLIPIZIDE 5 MG PO TABS: ORAL_TABLET | ORAL | Status: DC

## 2013-01-12 MED ORDER — "INSULIN SYRINGE 28G X 1/2"" 1 ML MISC": Status: DC

## 2013-01-12 NOTE — Progress Notes (Signed)
[facility]  Provider:  Melvyn Novas, M D  Referring Provider: Evelena Peat, DO Primary Care Physician:  Evelena Peat, DO  Chief Complaint  Patient presents with  . POST CPAP    CPAP CARE WAS DOWNLOADED IN CLINIC TODAY    HPI:  Ana Long is a 37 y.o. female  Is seen here as a referral/ revisit  from Dr. Andrey Campanile  and Marilynn Rail , CRT at [facility].    Ana Long is a 37 year old African American female patient of Dr. Evelena Peat. She reports that she was diagnosed with obstructive sleep apnea in the year 2002. The diagnosis was made at [facility] and followed with the CPAP prescription she has not had any adjustments to her CPAP since 2009 , but she had technical difficulties using the machine and by now it seems to have broken.  She reports that the water reservoir has at times stopped functioning or became to hot,  and that now she feels that the pressure provided night by night is very variable.  She presented to [facility] and was sent here for a reevaluation.  Interval history. Ana Long was evaluated in a split-night polysomnography dated 11-17-12 at [facility] sleep. She endorsed the Epworth Sleepiness Scale at 18 points. The patient helps Christinia at 10 points. Her BMI was 56.9 with a neck circumference of 22 inches. The patient was diagnosed with an AHI of 120- the same as her RDI.  An  average of 30 seconds separated apnea -hypopnea spells. She had 80 obstructive apneas at night but nor centrals or mixed events.  CO2 level rose to a level of 51.44 torr -  indicating that she had obesity hypoventilation,  a term used to for patient but cannot exchange oxygen and CO2.  The lowest oxygen desaturation was 73% but the saturation duration total time was only 16.8 minutes.  There were no periodic limb movements noted and her heart rate remained stable. The patient was titrated to 15 cm CPAP  with an AHI of 0.0 the lowest oxygen saturation at this pressure was 89%. She slept for 35.4 minutes of the spinal setting and 22 of these sleep minutes were in REM sleep. She eventually gained normal sleep architecture back once on CPAP.  Today his in office CPAP download shows a residual AHI of 4.1 which is a exquisitely good results. Stat pressure is 15 cm water with an EPR of 3 cm water. The average daily times using CPAP this 5 hours and 38 minutes. There are no central apneas noted in the residual AHI. The patient is 100% compliant using the machine on 35 out of 35 days. She does have air leaks , loses mask seal but is not aware of it. She uses a nasal pillow.   We are reports today that she is actually much more alert and able to personal social and work-related activities now that her sleepiness is reduced. Over the last 83 days her Epworth sleepiness score has decreased to 12 points from a previous level of 18 points and may still get him reduced further. Her fatigue severity score was endorsed at 31 points which is also decreased in comparison to previous visit. She does not have any bathroom breaks at night, she does not wake up with a headache,  still sometimes wakes up with a dry mouth, but not to the same degree as prior to CPAP use.  She adjusted humidity  as needed. She is now able to drive longer distances without getting sleepy she reports. She also is able to stay awake as a passenger. She rarely will take a nap now and she doesn't schedule a plan for any naps in her daytime. She feels that she is better able to concentrate and focus not fighting dry limits. It has given her a level of independence to be not as sleepy now but she appreciates greatly. 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  In her review of systems today she still endorsed weight gain, shortness of breath, cough, wheezing, joint pain and allergies with  runny nose. She notice an improvement in her nasal congestion after using CPAP but the wheezing itself has not been addressed by CPAP use,  of course. She has 6 little ones in the home, walking is her only exercise. She has to climb stairs to her home, is not getting SOB.    Last visit note: SLEEP CONSULTATION :  The patient's bedtime is between 10:30 and 11 PM, it'll take her up to an hour and a half to fall asleep. This is related to her shortness of breath. The patient may have one maximal to bathroom breaks at night she has to arise early at about 4:30 in the morning to get her children ready for school bus. She sleeps only 5 hours at night if at all.  Naps in daytime , gets back to bed at 7. 30 AM  and wakes again at 11 AM. Sleeps propped up on several pillows , other wise wakes SOB and coughing.     The patient reports that she has dyspnea since mid July 2014, she has experienced trouble coughing shortness of breath of breath, rhinorrhea.  She often has nasal congestion which could be allergic in origin. Orotidine have given her some relief. She has needed her albuterol inhaler 3 times in the past week.  She also reports that she sleeps on 3 pillows with orthopnea, which  was attributed by the patient and her primary care physician to her ongoing asthma and a poorly functioning CPAP machine. She often has vivid dreams. She is a nonsmoker and is not exposed to passive smoke. The patient does not have an out of the home workplace. 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 She has no history of shift work.  Past medical history is positive for significant asthma and steroid treatments have led to further weight gain. In the current asthma visit he and pre-existing obstructive sleep apnea may have exacerbated one another. The patient also was referred for  echocardiogram by her primary care physician or ankle edema has resolved since her primary care physician first noted this in August 2014.  The patient has several risk factors for sleep apnea and it is unlikely that apnea has resolved in the meantime. She has a history of nocturnal hypoxemia which was documented in 2002 study he also has asthma with at times acute exacerbations, lower extremity edema, diabetes mellitus poorly controlled according to her primary care physician. She remains hypersonic and has recently been more sleepy than upper.  She is also considered super rather than morbidly obese,  with a BMI of 55.8 and has not been evaluated for obesity hypoventilation in the past.   Review of Systems: Out  of a complete 14 system review, the patient complains of only the following symptoms, and all other reviewed systems are negative. Positive for weight gain, fatigue, shortness of breath, coughing, wheezing and snoring. There is also allergies and urination problems described the patient has diabetes and often troubled with insomnia especially to initiate sleep.  History   Social History  . Marital Status: Divorced    Spouse Name: N/A    Number of Children: 2  . Years of Education: 14   Occupational History  . UNEMPLOYED    Social History Main Topics  . Smoking status: Former Smoker -- 10 years    Quit date: 05/24/2010  . Smokeless tobacco: Never Used  . Alcohol Use: Yes     Comment: occasionally  . Drug Use: No  . Sexual Activity: Yes    Birth Control/ Protection: None     Comment: Lives w/a partner   Other Topics Concern  . Not on file   Social History Narrative   Single, but lives with a partner   Has 2 children and cares for an infant during the day          Family History  Problem Relation Age of Onset  . Other Mother   . Diabetes Maternal Aunt   . Heart attack Maternal Aunt   . Alzheimer's disease Maternal Uncle   . Cancer Maternal Uncle     prostate  .  Diabetes Paternal Aunt   . Cancer Paternal Aunt     brain,mouth  . Alzheimer's disease Paternal Aunt   . Diabetes Paternal Uncle   . Diabetes Maternal Grandmother   . Diabetes Maternal Grandfather   . Leukemia Cousin   . Other Other   . Obesity Other   . Other Other     Past Medical History  Diagnosis Date  . GERD (gastroesophageal reflux disease)   . Diabetes mellitus     intolerant to Metformin  . Herpes genital   . PCOS (polycystic ovarian syndrome)   . Morbid obesity with BMI of 50 to 60 07/23/2009  . Dry skin     hands   . Asthma     prn inhaler  . Arthritis     major joints  . Complication of anesthesia     states is hard to wake up post-op  . Family history of anesthesia complication     mother went into coma during c-section  . Sleep apnea, obstructive     uses CPAP nightly - had sleep study 08/22/2007  . Carpal tunnel syndrome of right wrist 09/2012  . Obesity hypoventilation syndrome 10/30/2012  . OSA on CPAP      AHI was on  11-17-12 to 120/hr, titrated to 15 cm with 3 cm EPR/     Past Surgical History  Procedure Laterality Date  . Hysteroscopy w/d&c  01-08-2002  . Carpal tunnel release Right 10/02/2012    Procedure: RIGHT CARPAL TUNNEL RELEASE ENDOSCOPIC;  Surgeon: Jodi Marble, MD;  Location: [facility];  Service: Orthopedics;  Laterality: Right;    Current Outpatient Prescriptions  Medication Sig Dispense Refill  . ACCU-CHEK FASTCLIX LANCETS MISC 1 each by Other route 2 (two) times daily.  102 each  5  . acyclovir (ZOVIRAX) 400 MG tablet Take 1 tablet (400 mg total) by mouth 2 (two) times daily.  60 tablet  3  . albuterol (VENTOLIN HFA) 108 (90 BASE) MCG/ACT inhaler Inhale 1-2 puffs into the lungs every 4 (four) hours as  needed for wheezing or shortness of breath.  1 Inhaler  11  . famotidine (PEPCID) 40 MG tablet Take 40 mg by mouth daily.      . Fluticasone-Salmeterol (ADVAIR DISKUS) 100-50 MCG/DOSE AEPB Inhale 1 puff into the lungs 2  (two) times daily.  1 each  1  . glipiZIDE (GLUCOTROL) 5 MG tablet TAKE ONE TABLET BY MOUTH TWICE DAILY  60 tablet  11  . glucosamine-chondroitin 500-400 MG tablet Take 1 tablet by mouth 3 (three) times daily.      Marland Kitchen glucose blood (ACCU-CHEK SMARTVIEW) test strip 1 each by Other route 2 (two) times daily.  100 each  5  . insulin glargine (LANTUS) 100 UNIT/ML injection INJECT 92 UNITS INTO THE ABDOMEN AT BEDTIME  3 vial  3  . loratadine (CLARITIN) 10 MG tablet Take 1 tablet (10 mg total) by mouth daily.  30 tablet  1  . Probiotic Product (PROBIOTIC DAILY PO) Take by mouth.      Marland Kitchen ibuprofen (ADVIL,MOTRIN) 200 MG tablet Take 4 tablets (800 mg total) by mouth every 8 (eight) hours as needed for pain.  144 tablet  0   No current facility-administered medications for this visit.    Allergies as of 01/12/2013 - Review Complete 01/12/2013  Allergen Reaction Noted  . Citrus Hives and Itching 07/20/2012  . Latex Swelling 07/20/2012  . Penicillins Hives and Itching 01/01/2006  . Soap Hives, Itching, and Swelling 07/20/2012  . Tomato Hives and Itching 07/20/2012  . Doxycycline Other (See Comments) 01/01/2006  . Metformin and related Rash 12/13/2010    Vitals: BP 123/74  Pulse 88  Resp 18  Ht 5\' 2"  (1.575 m)  Wt 311 lb (141.069 kg)  BMI 56.87 kg/m2 Last Weight:  Wt Readings from Last 1 Encounters:  01/12/13 311 lb (141.069 kg)   Last Height:   Ht Readings from Last 1 Encounters:  01/12/13 5\' 2"  (1.575 m)    Physical exam:  General: The patient is awake, alert and appears not in acute distress. The patient is well groomed. Head: Normocephalic, atraumatic. Neck is supple. Mallampati 3 - but large tonsils for an adult, lateral pillows and large tongue- all related to upper airway restriction by soft tissue.   , neck circumference: 22 inches, wheezing .  No TMJ, no retrognathia. Can breath through either nasion. Cardiovascular:  Regular rate and rhythm, without  murmurs or carotid  bruit,-neck veins. Respiratory: Lungs wheezing to auscultation. Skin:  Without evidence of  Rash, today not edematous at the ankle.  Trunk: BMI is over 55 , morbidly elevated .  Neurologic exam : The patient is awake and alert, oriented to place and time.  Memory subjective  described as intact. There is a normal attention span & concentration ability. Speech is fluent without  dysarthria, dysphonia or aphasia. Speech fluency is fragmented by shortness of breath Mood and affect are appropriate- she attributes her lack of energy to the untreated apnea since CPAP is broken. .  Cranial nerves: Pupils are equal and briskly reactive to light. extraocular movements  in vertical and horizontal planes intact and without nystagmus. Visual fields by finger perimetry are intact. Hearing to finger rub intact.  Facial sensation intact to fine touch. Facial motor strength is symmetric and tongue  midline.  Motor exam:   Normal tone , muscle bulk and symmetric strength in all extremities. Crepitation over both knees, painful restriction of ROM.  Sensory:  Fine touch, pinprick and vibration were tested in all extremities. Proprioception  is preserved . Coordination: Rapid alternating movements in the fingers/hands is tested and normal. Gait and station: Patient walks without assistive device-. Wide based . Deep tendon reflexes: in the  upper and lower extremities are symmetric and intact. Babinski maneuver  Equivocal/  downgoing.   Assessment:  After physical and neurologic examination, review of laboratory studies, imaging, neurophysiology testing and pre-existing records,   Severest OSA ,   obesity hypoventilation , see SPLIT night Polysomnography report.  Plan:  Treatment plan and additional workup :  Continue OSA treatment with CPAP at 15 cm and 3 cm EPR, new machine . [facility] follows as DME. Risk factor education: She is in nutritional counseling and in the process of developing a diet for this morbidly  obese patient with DM, BMI reduction addressed by PCP, surgical candidate for weight loss surgery? She will address this with her PCP. Asthma ; inhaler user.  GERD- may trigger asthma and OSA further- information and education provided, 30 minutes. Marland Kitchen

## 2013-01-12 NOTE — Telephone Encounter (Signed)
Call from [facility] for insulin syringe. Thanks

## 2013-01-12 NOTE — Patient Instructions (Signed)
Exercise to Lose Weight Exercise and a healthy diet may help you lose weight. Your doctor may suggest specific exercises. EXERCISE IDEAS AND TIPS  Choose low-cost things you enjoy doing, such as walking, bicycling, or exercising to workout videos.  Take stairs instead of the elevator.  Walk during your lunch break.  Park your car further away from work or school.  Go to a gym or an exercise class.  Start with 5 to 10 minutes of exercise each day. Build up to 30 minutes of exercise 4 to 6 days a week.  Wear shoes with good support and comfortable clothes.  Stretch before and after working out.  Work out until you breathe harder and your heart beats faster.  Drink extra water when you exercise.  Do not do so much that you hurt yourself, feel dizzy, or get very short of breath. Exercises that burn about 150 calories:  Running 1  miles in 15 minutes.  Playing volleyball for 45 to 60 minutes.  Washing and waxing a car for 45 to 60 minutes.  Playing touch football for 45 minutes.  Walking 1  miles in 35 minutes.  Pushing a stroller 1  miles in 30 minutes.  Playing basketball for 30 minutes.  Raking leaves for 30 minutes.  Bicycling 5 miles in 30 minutes.  Walking 2 miles in 30 minutes.  Dancing for 30 minutes.  Shoveling snow for 15 minutes.  Swimming laps for 20 minutes.  Walking up stairs for 15 minutes.  Bicycling 4 miles in 15 minutes.  Gardening for 30 to 45 minutes.  Jumping rope for 15 minutes.  Washing windows or floors for 45 to 60 minutes. Document Released: 03/13/2010 Document Revised: 05/03/2011 Document Reviewed: 03/13/2010 [employer] Patient Information 2014 [employer]. Calorie Counting Diet A calorie counting diet requires you to eat the number of calories that are right for you in a day. Calories are the measurement of how much energy you get from the food you eat. Eating the right amount of calories is important for staying at a  healthy weight. If you eat too many calories, your body will store them as fat and you may gain weight. If you eat too few calories, you may lose weight. Counting the number of calories you eat during a day will help you know if you are eating the right amount. A Registered Dietitian can determine how many calories you need in a day. The amount of calories needed varies from person to person. If your goal is to lose weight, you will need to eat fewer calories. Losing weight can benefit you if you are overweight or have health problems such as heart disease, high blood pressure, or diabetes. If your goal is to gain weight, you will need to eat more calories. Gaining weight may be necessary if you have a certain health problem that causes your body to need more energy. TIPS Whether you are increasing or decreasing the number of calories you eat during a day, it may be hard to get used to changes in what you eat and drink. The following are tips to help you keep track of the number of calories you eat.  Measure foods at home with measuring cups. This helps you know the amount of food and number of calories you are eating.  Restaurants often serve food in amounts that are larger than 1 serving. While eating out, estimate how many servings of a food you are given. For example, a serving of cooked rice   is  cup or about the size of half of a fist. Knowing serving sizes will help you be aware of how much food you are eating at restaurants.  Ask for smaller portion sizes or child-size portions at restaurants.  Plan to eat half of a meal at a restaurant. Take the rest home or share the other half with a friend.  Read the Nutrition Facts panel on food labels for calorie content and serving size. You can find out how many servings are in a package, the size of a serving, and the number of calories each serving has.  For example, a package might contain 3 cookies. The Nutrition Facts panel on that package says  that 1 serving is 1 cookie. Below that, it will say there are 3 servings in the container. The calories section of the Nutrition Facts label says there are 90 calories. This means there are 90 calories in 1 cookie (1 serving). If you eat 1 cookie you have eaten 90 calories. If you eat all 3 cookies, you have eaten 270 calories (3 servings x 90 calories = 270 calories). The list below tells you how big or small some common portion sizes are.  1 oz.........4 stacked dice.  3 oz........Marland KitchenDeck of cards.  1 tsp.......Marland KitchenTip of little finger.  1 tbs......Marland KitchenMarland KitchenThumb.  2 tbs.......Marland KitchenGolf ball.   cup......Marland KitchenHalf of a fist.  1 cup.......Marland KitchenA fist. KEEP A FOOD LOG Write down every food item you eat, the amount you eat, and the number of calories in each food you eat during the day. At the end of the day, you can add up the total number of calories you have eaten. It may help to keep a list like the one below. Find out the calorie information by reading the Nutrition Facts panel on food labels. Breakfast  Bran cereal (1 cup, 110 calories).  Fat-free milk ( cup, 45 calories). Snack  Apple (1 medium, 80 calories). Lunch  Spinach (1 cup, 20 calories).  Tomato ( medium, 20 calories).  Chicken breast strips (3 oz, 165 calories).  Shredded cheddar cheese ( cup, 110 calories).  Light [place] dressing (2 tbs, 60 calories).  Whole-wheat bread (1 slice, 80 calories).  Tub margarine (1 tsp, 35 calories).  Vegetable soup (1 cup, 160 calories). Dinner  Pork chop (3 oz, 190 calories).  Brown rice (1 cup, 215 calories).  Steamed broccoli ( cup, 20 calories).  Strawberries (1  cup, 65 calories).  Whipped cream (1 tbs, 50 calories). Daily Calorie Total: 1425 Document Released: 02/08/2005 Document Revised: 05/03/2011 Document Reviewed: 08/05/2006 [facility] Patient Information 2014 [place], [place]. Sleep Apnea  Sleep apnea is a sleep disorder characterized by abnormal pauses in breathing while  you sleep. When your breathing pauses, the level of oxygen in your blood decreases. This causes you to move out of deep sleep and into light sleep. As a result, your quality of sleep is poor, and the system that carries your blood throughout your body (cardiovascular system) experiences stress. If sleep apnea remains untreated, the following conditions can develop:  High blood pressure (hypertension).  Coronary artery disease.  Inability to achieve or maintain an erection (impotence).  Impairment of your thought process (cognitive dysfunction). There are three types of sleep apnea: 1. Obstructive sleep apnea Pauses in breathing during sleep because of a blocked airway. 2. Central sleep apnea Pauses in breathing during sleep because the area of the brain that controls your breathing does not send the correct signals to the muscles that control breathing. 3.  Mixed sleep apnea A combination of both obstructive and central sleep apnea. RISK FACTORS The following risk factors can increase your risk of developing sleep apnea:  Being overweight.  Smoking.  Having narrow passages in your nose and throat.  Being of older age.  Being female.  Alcohol use.  Sedative and tranquilizer use.  Ethnicity. Among individuals younger than 35 years, African Americans are at increased risk of sleep apnea. SYMPTOMS   Difficulty staying asleep.  Daytime sleepiness and fatigue.  Loss of energy.  Irritability.  Loud, heavy snoring.  Morning headaches.  Trouble concentrating.  Forgetfulness.  Decreased interest in sex. DIAGNOSIS  In order to diagnose sleep apnea, your caregiver will perform a physical examination. Your caregiver may suggest that you take a home sleep test. Your caregiver may also recommend that you spend the night in a sleep lab. In the sleep lab, several monitors record information about your heart, lungs, and brain while you sleep. Your leg and arm movements and blood oxygen  level are also recorded. TREATMENT The following actions may help to resolve mild sleep apnea:  Sleeping on your side.   Using a decongestant if you have nasal congestion.   Avoiding the use of depressants, including alcohol, sedatives, and narcotics.   Losing weight and modifying your diet if you are overweight. There also are devices and treatments to help open your airway:  Oral appliances. These are custom-made mouthpieces that shift your lower jaw forward and slightly open your bite. This opens your airway.  Devices that create positive airway pressure. This positive pressure "splints" your airway open to help you breathe better during sleep. The following devices create positive airway pressure:  Continuous positive airway pressure (CPAP) device. The CPAP device creates a continuous level of air pressure with an air pump. The air is delivered to your airway through a mask while you sleep. This continuous pressure keeps your airway open.  Nasal expiratory positive airway pressure (EPAP) device. The EPAP device creates positive air pressure as you exhale. The device consists of single-use valves, which are inserted into each nostril and held in place by adhesive. The valves create very little resistance when you inhale but create much more resistance when you exhale. That increased resistance creates the positive airway pressure. This positive pressure while you exhale keeps your airway open, making it easier to breath when you inhale again.  Bilevel positive airway pressure (BPAP) device. The BPAP device is used mainly in patients with central sleep apnea. This device is similar to the CPAP device because it also uses an air pump to deliver continuous air pressure through a mask. However, with the BPAP machine, the pressure is set at two different levels. The pressure when you exhale is lower than the pressure when you inhale.  Surgery. Typically, surgery is only done if you cannot  comply with less invasive treatments or if the less invasive treatments do not improve your condition. Surgery involves removing excess tissue in your airway to create a wider passage way. Document Released: 01/29/2002 Document Revised: 06/05/2012 Document Reviewed: 06/17/2011 [facility] Patient Information 2014 [place], [place].

## 2013-01-16 ENCOUNTER — Encounter: Payer: Self-pay | Admitting: Neurology

## 2013-01-17 ENCOUNTER — Encounter: Payer: Self-pay | Admitting: Internal Medicine

## 2013-01-17 ENCOUNTER — Ambulatory Visit (INDEPENDENT_AMBULATORY_CARE_PROVIDER_SITE_OTHER): Payer: Medicaid Other | Admitting: Internal Medicine

## 2013-01-17 VITALS — BP 124/72 | HR 81 | Temp 97.6°F | Ht 62.0 in | Wt 308.4 lb

## 2013-01-17 DIAGNOSIS — A084 Viral intestinal infection, unspecified: Secondary | ICD-10-CM

## 2013-01-17 DIAGNOSIS — M25519 Pain in unspecified shoulder: Secondary | ICD-10-CM | POA: Insufficient documentation

## 2013-01-17 DIAGNOSIS — Z0000 Encounter for general adult medical examination without abnormal findings: Secondary | ICD-10-CM

## 2013-01-17 DIAGNOSIS — J302 Other seasonal allergic rhinitis: Secondary | ICD-10-CM

## 2013-01-17 DIAGNOSIS — J309 Allergic rhinitis, unspecified: Secondary | ICD-10-CM

## 2013-01-17 DIAGNOSIS — A088 Other specified intestinal infections: Secondary | ICD-10-CM

## 2013-01-17 DIAGNOSIS — M25512 Pain in left shoulder: Secondary | ICD-10-CM

## 2013-01-17 DIAGNOSIS — J45909 Unspecified asthma, uncomplicated: Secondary | ICD-10-CM

## 2013-01-17 MED ORDER — TRAMADOL HCL 50 MG PO TABS: 50.0000 mg | ORAL_TABLET | Freq: Four times a day (QID) | ORAL | Status: DC | PRN

## 2013-01-17 NOTE — Patient Instructions (Addendum)
Thank you for your visit.  I think there is a good chance that your ear symptoms are due to uncontrolled allergies. Please re-start taking your Claritin.  I think you may have a mild viral infection which is causing your diarrhea. Please drink a lot of fluids to keep well hydrated.  I prescribed tramadol for your shoulder pain. Please be aware that this medication can make you drowsy. You can take this medication up to every 6 hours as needed for pain.  Please return to clinic as needed if your symptoms do not improve. Otherwise you may follow up with your PCP in 3 months.

## 2013-01-17 NOTE — Assessment & Plan Note (Signed)
Patient with two-week history of left shoulder pain with no prior injury. This is affecting her sleep. She is taking tramadol in the past for her joint pain, which has helped. She's been taking ibuprofen with no relief of her symptoms. I believe this is some kind of musculoskeletal pain, most likely secondary to muscle strain. 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 There is no point tenderness nor does she endorse prior injury, therefore I do not favor bone fracture or other bony abnormality. -Tramadol 50mg  q6h, #45 ( I do not intend to refill this medication for this purpose) -if symptoms not improved within a few weeks, patient may benefit from shoulder xray to evaluate for OA

## 2013-01-17 NOTE — Assessment & Plan Note (Signed)
Patient deferred flu shot, but states she would like to return to clinic to receive it at a later time when she is not feeling ill.

## 2013-01-17 NOTE — Assessment & Plan Note (Signed)
Patient notes worsening shortness of breath and a mildly productive cough for the past week. I believe this represents a mild exacerbation of her asthma in the setting of likely seasonal allergies. She's been taking her inhaler 4 times in the last week, which is much more than her usual (usually does twice per month). Patient endorses being off of Advair over the past month because she has been forgetting to take it. I encouraged patient to continue taking this medication as I believe it will help her asthma control and decrease use of her albuterol inhaler.

## 2013-01-17 NOTE — Progress Notes (Signed)
Patient ID: Ana Long, female   DOB: Nov 25, 1975, 37 y.o.   MRN: [id number] HPI The patient is a 37 y.o. female with a history of morbid obesity, diabetes type 2 (last A1c 7.8% 11/22/12), asthma, OSA, GERD, CKD stage 1, HLD who presents to the clinic for an acute visit.  Patient reports having intermittent "dull" ear pain that is associated w/ "whooshing or vibration" noise in her b/l ears x 3-4 weeks. Patient notes that the pain and noise come on for 3-4 minutes at a time and happens multiple times throughout the day, sound is muffled during these episodes There is no associated dizziness or tinnitus. She is unsure what brings it on or makes it worse. She also endorses having increased sensitivity to sound, especially in enclosed spaces. She has h/o seasonal allergies as well as blepharitis and is also c/o bilateral itchy eyes. She has had a cough productive of clear/yellow sputum for 1 week. She believes this is due to her asthma since she has been having to take her albuterol inhaler 4 times in the last week (she usually takes this only twice per month). She also has not been taking her Advair in the last month because she forgets to take it. She also has had some morning nausea and looser stools than usual over the past 2 days, denies watery or profuse diarrhea, no blood in her stool. Denies HA, ST, F/C, vomiting. Two children are sick at home w/ cough (they also recently had diarrhea).   She also notes after our visit had ended that she had L shoulder pain x 2 weeks. This is making it difficult for her to sleep. No injury. She has been taking ibuprofen without relief of symptoms. It is worse with shoulder movement especially internal rotation.   ROS: General: no fevers, chills, changes in weight, changes in appetite Skin: no rash HEENT: see HPI Pulm: see HPI CV: no chest pain, palpitations Abd: see HPI GU: no dysuria, hematuria Ext: see HPI Neuro: no weakness, numbness, or  tingling  Filed Vitals:   01/17/13 0932  BP: 124/72  Pulse: 81  Temp: 97.6 F (36.4 C)   PEX General: alert, cooperative, and in no apparent distress HEENT: pupils equal round and reactive to light, vision grossly intact, oropharynx clear and non-erythematous, though large tonsils bilaterally; TMs are pearly white with good light reflex bilaterally; there is some bright red erythema to bilaterallyy external auditory canal near the TMs, no pain with manipulation of pinnae bilaterally; no ear or eye drainage present Neck: supple, no lymphadenopathy Lungs: clear to ascultation bilaterally, normal work of respiration, no wheezes, rales, ronchi Heart: regular rate and rhythm, no murmurs, gallops, or rubs Abdomen: morbidly obese, soft, non-tender, non-distended, normal bowel sounds Extremities: warm extremities bilaterally, no BLE edema; TTP over L trapezius and pectoralis major muscles; no point tenderness over clavicle, humerus, or scapula; ROM slightly limited 2/2 pain w/ pt able to raise arm 140 degrees and behind body toward left posterior flank Neurologic: alert & oriented X3, cranial nerves II-XII grossly intact, strength grossly intact, sensation intact to light touch  Current Outpatient Prescriptions on File Prior to Visit  Medication Sig Dispense Refill  . ACCU-CHEK FASTCLIX LANCETS MISC 1 each by Other route 2 (two) times daily.  102 each  5  . acyclovir (ZOVIRAX) 400 MG tablet Take 1 tablet (400 mg total) by mouth 2 (two) times daily.  60 tablet  3  . albuterol (VENTOLIN HFA) 108 (90 BASE) MCG/ACT inhaler Inhale  1-2 puffs into the lungs every 4 (four) hours as needed for wheezing or shortness of breath.  1 Inhaler  11  . famotidine (PEPCID) 40 MG tablet Take 40 mg by mouth daily.      . Fluticasone-Salmeterol (ADVAIR DISKUS) 100-50 MCG/DOSE AEPB Inhale 1 puff into the lungs 2 (two) times daily.  1 each  1  . glipiZIDE (GLUCOTROL) 5 MG tablet TAKE ONE TABLET BY MOUTH TWICE DAILY  60  tablet  11  . glucosamine-chondroitin 500-400 MG tablet Take 1 tablet by mouth 3 (three) times daily.      Marland Kitchen glucose blood (ACCU-CHEK SMARTVIEW) test strip 1 each by Other route 2 (two) times daily.  100 each  5  . ibuprofen (ADVIL,MOTRIN) 200 MG tablet Take 4 tablets (800 mg total) by mouth every 8 (eight) hours as needed for pain.  144 tablet  0  . insulin glargine (LANTUS) 100 UNIT/ML injection INJECT 92 UNITS INTO THE ABDOMEN AT BEDTIME  3 vial  3  . Insulin Syringe-Needle U-100 (INSULIN SYRINGE 1CC/28G) 28G X 1/2" 1 ML MISC Use to administer insulin daily. Dx code: 88.02. Insulin dependent.  100 each  3  . loratadine (CLARITIN) 10 MG tablet Take 1 tablet (10 mg total) by mouth daily.  30 tablet  1  . Probiotic Product (PROBIOTIC DAILY PO) Take by mouth.       No current facility-administered medications on file prior to visit.    Assessment/Plan

## 2013-01-17 NOTE — Assessment & Plan Note (Signed)
Patient complains of ear pain, muffled hearing and a "whooshing" sound in her ears for the past couple of weeks. Patient has seasonal allergies usually occur in the spring, the patient does think that her current symptoms may be explained by allergies. On exam, patient has irritation to her bilateral external ear canals without signs of acute otitis media or otitis externa. Patient does not describe having any dizziness or tinnitus therefore do not favor Mnire's disease or BPPV. I believe this episode may represent exacerbation of her allergies. She has not been taking Claritin since the spring time and just recently the season changed dramatically. I encouraged her to start taking claritin daily again. I suspect that her symptoms will improve with the Claritin, however if her symptoms do not improve over the course of the next 2 weeks I encouraged patient to return to clinic at which point a corticosteroid nasal spray may be considered.

## 2013-01-17 NOTE — Progress Notes (Signed)
I saw and evaluated the patient.  I personally confirmed the key portions of the history and exam documented by Dr. Chikowski and I reviewed pertinent patient test results.  The assessment, diagnosis, and plan were formulated together and I agree with the documentation in the resident's note. 

## 2013-01-17 NOTE — Assessment & Plan Note (Signed)
Patient c/o 2 day history of loose stools and nausea, no vomiting. She has had multiple sick contacts at home w/ similar symptoms. I believe this may represent acute viral gastroenteritis and I believe this will resolve on its own in a few days. I encouraged patient to keep well hydrated drinking lots of water but still avoiding sugary drinks given her diabetes.

## 2013-01-22 ENCOUNTER — Ambulatory Visit (INDEPENDENT_AMBULATORY_CARE_PROVIDER_SITE_OTHER): Payer: Medicaid Other | Admitting: Family Medicine

## 2013-01-22 ENCOUNTER — Ambulatory Visit: Payer: Medicaid Other | Admitting: Family Medicine

## 2013-01-22 VITALS — BP 103/71 | HR 90 | Ht 62.0 in | Wt 308.2 lb

## 2013-01-22 DIAGNOSIS — M25561 Pain in right knee: Secondary | ICD-10-CM

## 2013-01-22 DIAGNOSIS — M25569 Pain in unspecified knee: Secondary | ICD-10-CM

## 2013-01-22 NOTE — Patient Instructions (Signed)
Take tylenol 500mg  1-2 tabs three times a day for pain. Aleve 1-2 tabs twice a day with food Glucosamine sulfate 750mg  twice a day is a supplement that may help. Capsaicin topically up to four times a day may also help with pain. Cortisone injections are an option - you were given this today. If cortisone injections do not help, there are different types of shots that may help but they take longer to take effect. It's important that you continue to stay active. If you are overweight, try to lose weight through diet and exercise. Straight leg raises, knee extensions 3 sets of 10 once a day (add ankle weight if these become too easy). Consider physical therapy to strengthen muscles around the joint that hurts to take pressure off of the joint itself. Shoe inserts with good arch support may be helpful. Walker or cane if needed. Heat or ice 15 minutes at a time 3-4 times a day as needed to help with pain. Water aerobics and cycling with low resistance are the best two types of exercise for arthritis. Follow up as needed.

## 2013-01-23 ENCOUNTER — Encounter: Payer: Self-pay | Admitting: Family Medicine

## 2013-01-23 NOTE — Progress Notes (Signed)
Patient ID: Ana Long Remick-Ward, female   DOB: 04-Dec-1975, 37 y.o.   MRN: [id number]  PCP: Evelena Peat, DO  Subjective:   HPI: Patient is a 37 y.o. female here for right knee pain.  7/23: Patient reports 3-5 years of right knee pain. Had an injection in October 2013 that helped a lot until past 2 months when pain worsened again. Has been doing more cleaning and remodeling of her home. Has difficulty sleeping due to pain. Feels like it's giving out on her. Uses cane. Difficulty getting comfortable. Worse with twisting.  12/1: Patient reports injection helped up until about 2 weeks ago. Swelling. Taking ibuprofen and tramadol for pain. Using cane still. No new injuries, changes. No catching, locking. Giving out sensation at times.  Past Medical History  Diagnosis Date  . GERD (gastroesophageal reflux disease)   . Diabetes mellitus     intolerant to Metformin  . Herpes genital   . PCOS (polycystic ovarian syndrome)   . Morbid obesity with BMI of 50 to 60 07/23/2009  . Dry skin     hands   . Asthma     prn inhaler  . Arthritis     major joints  . Complication of anesthesia     states is hard to wake up post-op  . Family history of anesthesia complication     mother went into coma during c-section  . Sleep apnea, obstructive     uses CPAP nightly - had sleep study 08/22/2007  . Carpal tunnel syndrome of right wrist 09/2012  . Obesity hypoventilation syndrome 10/30/2012  . OSA on CPAP      AHI was on  11-17-12 to 120/hr, titrated to 15 cm with 3 cm EPR/     Current Outpatient Prescriptions on File Prior to Visit  Medication Sig Dispense Refill  . ACCU-CHEK FASTCLIX LANCETS MISC 1 each by Other route 2 (two) times daily.  102 each  5  . acyclovir (ZOVIRAX) 400 MG tablet Take 1 tablet (400 mg total) by mouth 2 (two) times daily.  60 tablet  3  . albuterol (VENTOLIN HFA) 108 (90 BASE) MCG/ACT inhaler Inhale 1-2 puffs into the lungs every 4 (four) hours as needed for  wheezing or shortness of breath.  1 Inhaler  11  . famotidine (PEPCID) 40 MG tablet Take 40 mg by mouth daily.      . Fluticasone-Salmeterol (ADVAIR DISKUS) 100-50 MCG/DOSE AEPB Inhale 1 puff into the lungs 2 (two) times daily.  1 each  1  . glipiZIDE (GLUCOTROL) 5 MG tablet TAKE ONE TABLET BY MOUTH TWICE DAILY  60 tablet  11  . glucosamine-chondroitin 500-400 MG tablet Take 1 tablet by mouth 3 (three) times daily.      Marland Kitchen glucose blood (ACCU-CHEK SMARTVIEW) test strip 1 each by Other route 2 (two) times daily.  100 each  5  . ibuprofen (ADVIL,MOTRIN) 200 MG tablet Take 4 tablets (800 mg total) by mouth every 8 (eight) hours as needed for pain.  144 tablet  0  . insulin glargine (LANTUS) 100 UNIT/ML injection INJECT 92 UNITS INTO THE ABDOMEN AT BEDTIME  3 vial  3  . Insulin Syringe-Needle U-100 (INSULIN SYRINGE 1CC/28G) 28G X 1/2" 1 ML MISC Use to administer insulin daily. Dx code: 35.02. Insulin dependent.  100 each  3  . loratadine (CLARITIN) 10 MG tablet Take 1 tablet (10 mg total) by mouth daily.  30 tablet  1  . Probiotic Product (PROBIOTIC DAILY PO) Take by mouth.      Marland Kitchen  traMADol (ULTRAM) 50 MG tablet Take 1 tablet (50 mg total) by mouth every 6 (six) hours as needed.  45 tablet  0   No current facility-administered medications on file prior to visit.    Past Surgical History  Procedure Laterality Date  . Hysteroscopy w/d&c  01-08-2002  . Carpal tunnel release Right 10/02/2012    Procedure: RIGHT CARPAL TUNNEL RELEASE ENDOSCOPIC;  Surgeon: Jodi Marble, MD;  Location: [facility];  Service: Orthopedics;  Laterality: Right;    Allergies  Allergen Reactions  . Citrus Hives and Itching  . Latex Swelling  . Penicillins Hives and Itching  . Soap Hives, Itching and Swelling    PUREX LAUNDRY DETERGENT  . Tomato Hives and Itching  . Doxycycline Other (See Comments)    AFFECTS JOINTS  . Metformin And Related Rash    GI UPSET    History   Social History  . Marital  Status: Divorced    Spouse Name: N/A    Number of Children: 2  . Years of Education: 14   Occupational History  . UNEMPLOYED    Social History Main Topics  . Smoking status: Former Smoker -- 10 years    Quit date: 05/24/2010  . Smokeless tobacco: Never Used  . Alcohol Use: Yes     Comment: rarely  . Drug Use: No  . Sexual Activity: Not on file     Comment: Lives w/a partner   Other Topics Concern  . Not on file   Social History Narrative   Single, but lives with a partner   Has 2 children and cares for an infant during the day          Family History  Problem Relation Age of Onset  . Other Mother   . Diabetes Maternal Aunt   . Heart attack Maternal Aunt   . Alzheimer's disease Maternal Uncle   . Cancer Maternal Uncle     prostate  . Diabetes Paternal Aunt   . Cancer Paternal Aunt     brain,mouth  . Alzheimer's disease Paternal Aunt   . Diabetes Paternal Uncle   . Diabetes Maternal Grandmother   . Diabetes Maternal Grandfather   . Leukemia Cousin   . Other Other   . Obesity Other   . Other Other     BP 103/71  Pulse 90  Ht 5\' 2"  (1.575 m)  Wt 308 lb 3.2 oz (139.799 kg)  BMI 56.36 kg/m2  Review of Systems: See HPI above.    Objective:  Physical Exam:  Gen: NAD  R knee: No gross deformity, ecchymoses.  Mild effusion. TTP medial > lateral joint lines.  TTP post patellar facets as well. FROM. Negative ant/post drawers. Negative valgus/varus testing. Negative lachmanns. Pain medially with mcmurrays, apleys. Negative apprehension. NV intact distally.    Assessment & Plan:  1. Right knee pain - known advanced DJD of left knee despite fairly normal radiographs.  Suspect similar appearance of right knee. Repeated intraarticular injection today with ultrasound guidance into small effusion she has by u/s.  Again reviewed tylenol, aleve, glucosamine, capsaicin.  Consider viscosupplementation, physical therapy in future.  After informed written consent,  patient was lying supine on exam table. Right knee was prepped with alcohol swab and utilizing superolateral approach under ultrasound guidance, patient's right knee was injected intraarticularly with 3:1 marcaine: depomedrol. Patient tolerated the procedure well without immediate complications.

## 2013-02-07 ENCOUNTER — Encounter: Payer: Self-pay | Admitting: Family Medicine

## 2013-02-07 ENCOUNTER — Ambulatory Visit (INDEPENDENT_AMBULATORY_CARE_PROVIDER_SITE_OTHER): Payer: Medicaid Other | Admitting: Family Medicine

## 2013-02-07 VITALS — BP 108/69 | HR 99 | Ht 62.0 in | Wt 310.0 lb

## 2013-02-07 DIAGNOSIS — M25519 Pain in unspecified shoulder: Secondary | ICD-10-CM

## 2013-02-07 DIAGNOSIS — M67919 Unspecified disorder of synovium and tendon, unspecified shoulder: Secondary | ICD-10-CM

## 2013-02-07 DIAGNOSIS — M7582 Other shoulder lesions, left shoulder: Secondary | ICD-10-CM

## 2013-02-07 DIAGNOSIS — M25512 Pain in left shoulder: Secondary | ICD-10-CM

## 2013-02-07 NOTE — Patient Instructions (Signed)
You have rotator cuff tendinitis/impingement. Try to avoid painful activities (overhead activities, lifting with extended arm) as much as possible. Aleve 2 tabs twice a day with food for pain and inflammation. Try biofreeze, capsaicin. Can continue your tramadol as needed. Subacromial injection may be beneficial to help with pain and to decrease inflammation. Start physical therapy with transition to home exercise program. Do home exercise program with theraband and scapular stabilization exercises daily - these are very important for long term relief even if an injection was given. If not improving at follow-up we will consider further imaging, injection and/or nitro patches. Follow up with me in 6 weeks.  Can come back sooner if you would like an injection.

## 2013-02-08 ENCOUNTER — Ambulatory Visit: Payer: Medicaid Other

## 2013-02-09 ENCOUNTER — Encounter: Payer: Self-pay | Admitting: Family Medicine

## 2013-02-09 NOTE — Assessment & Plan Note (Signed)
Left shoulder rotator cuff impingement - NSAIDs, biofreeze, capsaicin, physical therapy and home exercise program.  Consider further imaging, injection, nitro patches if not improving as expected.  F/u in 6 weeks.

## 2013-02-09 NOTE — Progress Notes (Signed)
Patient ID: Ana Long, female   DOB: May 18, 1975, 37 y.o.   MRN: [id number]  PCP: Evelena Peat, DO  Subjective:   HPI: Patient is a 37 y.o. female here for left shoulder pain.  Patient has had years of issues with bilateral shoulder pain, diagnosed with rotator cuff syndrome in the past. Responded well to PT before. States for past month believes she overused her left shoulder. Resulting in constant deep shoulder pain with severe sharp pains that come on. + night pain. Tried glucosamine, probiotics, tramadol. Difficulty with overhead motions, picking items up.  Past Medical History  Diagnosis Date  . GERD (gastroesophageal reflux disease)   . Diabetes mellitus     intolerant to Metformin  . Herpes genital   . PCOS (polycystic ovarian syndrome)   . Morbid obesity with BMI of 50 to 60 07/23/2009  . Dry skin     hands   . Asthma     prn inhaler  . Arthritis     major joints  . Complication of anesthesia     states is hard to wake up post-op  . Family history of anesthesia complication     mother went into coma during c-section  . Sleep apnea, obstructive     uses CPAP nightly - had sleep study 08/22/2007  . Carpal tunnel syndrome of right wrist 09/2012  . Obesity hypoventilation syndrome 10/30/2012  . OSA on CPAP      AHI was on  11-17-12 to 120/hr, titrated to 15 cm with 3 cm EPR/     Current Outpatient Prescriptions on File Prior to Visit  Medication Sig Dispense Refill  . ACCU-CHEK FASTCLIX LANCETS MISC 1 each by Other route 2 (two) times daily.  102 each  5  . acyclovir (ZOVIRAX) 400 MG tablet Take 1 tablet (400 mg total) by mouth 2 (two) times daily.  60 tablet  3  . albuterol (VENTOLIN HFA) 108 (90 BASE) MCG/ACT inhaler Inhale 1-2 puffs into the lungs every 4 (four) hours as needed for wheezing or shortness of breath.  1 Inhaler  11  . famotidine (PEPCID) 40 MG tablet Take 40 mg by mouth daily.      . Fluticasone-Salmeterol (ADVAIR DISKUS) 100-50 MCG/DOSE AEPB  Inhale 1 puff into the lungs 2 (two) times daily.  1 each  1  . glipiZIDE (GLUCOTROL) 5 MG tablet TAKE ONE TABLET BY MOUTH TWICE DAILY  60 tablet  11  . glucosamine-chondroitin 500-400 MG tablet Take 1 tablet by mouth 3 (three) times daily.      Marland Kitchen glucose blood (ACCU-CHEK SMARTVIEW) test strip 1 each by Other route 2 (two) times daily.  100 each  5  . ibuprofen (ADVIL,MOTRIN) 200 MG tablet Take 4 tablets (800 mg total) by mouth every 8 (eight) hours as needed for pain.  144 tablet  0  . insulin glargine (LANTUS) 100 UNIT/ML injection INJECT 92 UNITS INTO THE ABDOMEN AT BEDTIME  3 vial  3  . Insulin Syringe-Needle U-100 (INSULIN SYRINGE 1CC/28G) 28G X 1/2" 1 ML MISC Use to administer insulin daily. Dx code: 78.02. Insulin dependent.  100 each  3  . loratadine (CLARITIN) 10 MG tablet Take 1 tablet (10 mg total) by mouth daily.  30 tablet  1  . Probiotic Product (PROBIOTIC DAILY PO) Take by mouth.      . traMADol (ULTRAM) 50 MG tablet Take 1 tablet (50 mg total) by mouth every 6 (six) hours as needed.  45 tablet  0  No current facility-administered medications on file prior to visit.    Past Surgical History  Procedure Laterality Date  . Hysteroscopy w/d&c  01-08-2002  . Carpal tunnel release Right 10/02/2012    Procedure: RIGHT CARPAL TUNNEL RELEASE ENDOSCOPIC;  Surgeon: Jodi Marble, MD;  Location: [facility];  Service: Orthopedics;  Laterality: Right;    Allergies  Allergen Reactions  . Citrus Hives and Itching  . Latex Swelling  . Penicillins Hives and Itching  . Soap Hives, Itching and Swelling    PUREX LAUNDRY DETERGENT  . Tomato Hives and Itching  . Doxycycline Other (See Comments)    AFFECTS JOINTS  . Metformin And Related Rash    GI UPSET    History   Social History  . Marital Status: Divorced    Spouse Name: N/A    Number of Children: 2  . Years of Education: 14   Occupational History  . UNEMPLOYED    Social History Main Topics  . Smoking  status: Former Smoker -- 10 years    Quit date: 05/24/2010  . Smokeless tobacco: Never Used  . Alcohol Use: Yes     Comment: rarely  . Drug Use: No  . Sexual Activity: Not on file     Comment: Lives w/a partner   Other Topics Concern  . Not on file   Social History Narrative   Single, but lives with a partner   Has 2 children and cares for an infant during the day          Family History  Problem Relation Age of Onset  . Other Mother   . Diabetes Maternal Aunt   . Heart attack Maternal Aunt   . Alzheimer's disease Maternal Uncle   . Cancer Maternal Uncle     prostate  . Diabetes Paternal Aunt   . Cancer Paternal Aunt     brain,mouth  . Alzheimer's disease Paternal Aunt   . Diabetes Paternal Uncle   . Diabetes Maternal Grandmother   . Diabetes Maternal Grandfather   . Leukemia Cousin   . Other Other   . Obesity Other   . Other Other     BP 108/69  Pulse 99  Ht 5\' 2"  (1.575 m)  Wt 310 lb (140.615 kg)  BMI 56.69 kg/m2  Review of Systems: See HPI above.    Objective:  Physical Exam:  Gen: NAD, obese.  Left shoulder: No swelling, ecchymoses.  No gross deformity. Anterior tenderness.  No other TTP about shoulder. Full ER.  Abduction and flexion to 110 degrees. Positive Hawkins, Neers. Negative Yergasons. Strength 5/5 with empty can and resisted internal/external rotation.  Pain empty can. NV intact distally.    Assessment & Plan:  1. Left shoulder rotator cuff impingement - NSAIDs, biofreeze, capsaicin, physical therapy and home exercise program.  Consider further imaging, injection, nitro patches if not improving as expected.  F/u in 6 weeks.

## 2013-02-13 ENCOUNTER — Ambulatory Visit: Payer: Medicaid Other | Admitting: Physical Therapy

## 2013-02-20 ENCOUNTER — Ambulatory Visit: Payer: Medicaid Other | Attending: Family Medicine | Admitting: Physical Therapy

## 2013-02-20 DIAGNOSIS — IMO0001 Reserved for inherently not codable concepts without codable children: Secondary | ICD-10-CM | POA: Insufficient documentation

## 2013-02-20 DIAGNOSIS — M25619 Stiffness of unspecified shoulder, not elsewhere classified: Secondary | ICD-10-CM | POA: Insufficient documentation

## 2013-02-20 DIAGNOSIS — M25519 Pain in unspecified shoulder: Secondary | ICD-10-CM | POA: Insufficient documentation

## 2013-03-01 ENCOUNTER — Other Ambulatory Visit (HOSPITAL_COMMUNITY)
Admission: RE | Admit: 2013-03-01 | Discharge: 2013-03-01 | Disposition: A | Discharge status: Home / Self Care | Payer: Medicaid Other | Source: Ambulatory Visit | Attending: Advanced Practice Midwife | Admitting: Advanced Practice Midwife

## 2013-03-01 ENCOUNTER — Encounter: Payer: Self-pay | Admitting: Advanced Practice Midwife

## 2013-03-01 ENCOUNTER — Ambulatory Visit (INDEPENDENT_AMBULATORY_CARE_PROVIDER_SITE_OTHER): Payer: Medicaid Other | Admitting: Advanced Practice Midwife

## 2013-03-01 VITALS — BP 127/86 | HR 73 | Temp 97.9°F | Ht 62.0 in | Wt 300.0 lb

## 2013-03-01 DIAGNOSIS — Z113 Encounter for screening for infections with a predominantly sexual mode of transmission: Secondary | ICD-10-CM | POA: Insufficient documentation

## 2013-03-01 DIAGNOSIS — N898 Other specified noninflammatory disorders of vagina: Secondary | ICD-10-CM

## 2013-03-01 DIAGNOSIS — Z0000 Encounter for general adult medical examination without abnormal findings: Secondary | ICD-10-CM

## 2013-03-01 DIAGNOSIS — Z1151 Encounter for screening for human papillomavirus (HPV): Secondary | ICD-10-CM | POA: Insufficient documentation

## 2013-03-01 DIAGNOSIS — Z01419 Encounter for gynecological examination (general) (routine) without abnormal findings: Secondary | ICD-10-CM | POA: Insufficient documentation

## 2013-03-01 DIAGNOSIS — R87810 Cervical high risk human papillomavirus (HPV) DNA test positive: Secondary | ICD-10-CM | POA: Insufficient documentation

## 2013-03-01 DIAGNOSIS — M25569 Pain in unspecified knee: Secondary | ICD-10-CM

## 2013-03-01 LAB — POCT PREGNANCY, URINE: Preg Test, Ur: NEGATIVE

## 2013-03-01 NOTE — Patient Instructions (Signed)
Safe Sex Safe sex is about reducing the risk of giving or getting a sexually transmitted disease (STD). STDs are spread through sexual contact involving the genitals, mouth, or rectum. Some STDS can be cured and others cannot. Safe sex can also prevent unintended pregnancies.  SAFE SEX PRACTICES  Limit your sexual activity to only one partner who is only having sex with you.  Talk to your partner about their past partners, past STDs, and drug use.  Use a condom every time you have sexual intercourse. This includes vaginal, oral, and anal sexual activity. Both females and males should wear condoms during oral sex. Only use latex or polyurethane condoms and water-based lubricants. Petroleum-based lubricants or oils used to lubricate a condom will weaken the condom and increase the chance that it will break. The condom should be in place from the beginning to the end of sexual activity. Wearing a condom reduces, but does not completely eliminate, your risk of getting or giving a STD. STDs can be spread by contact with skin of surrounding areas.  Get vaccinated for hepatitis B and HPV.  Avoid alcohol and recreational drugs which can affect your judgement. You may forget to use a condom or participate in high-risk sex.  For females, avoid douching after sexual intercourse. Douching can spread an infection farther into the reproductive tract.  Check your body for signs of sores, blisters, rashes, or unusual discharge. See your caregiver if you notice any of these signs.  Avoid sexual contact if you have symptoms of an infection or are being treated for an STD. If you or your partner has herpes, avoid sexual contact when blisters are present. Use condoms at all other times.  See your caregiver for regular screenings, examinations, and tests for STDs. Before having sex with a new partner, each of you should be screened for STDs and talk about the results with your partner. BENEFITS OF SAFE SEX   There  is less of a chance of getting or giving an STD.  You can prevent unwanted or unintended pregnancies.  By discussing safer sex concerns with your partner, you may increase feelings of intimacy, comfort, trust, and honesty between the both of you. Document Released: 03/18/2004 Document Revised: 11/03/2011 Document Reviewed: 08/02/2011 [facility] Patient Information 2014 [place].

## 2013-03-01 NOTE — Progress Notes (Signed)
  Subjective:     Ana Long is a 38 y.o. female here for a routine exam.  Current complaints: Vaginal discharge, requests STD testing, has discharge with odor.  Has lost 10 lbs, hopes to lose 80 more Hemoglobin A1C was 13, now 7  .  Personal health questionnaire reviewed: Wants STD testing, routine..   Gynecologic History Patient's last menstrual period was 01/17/2013. Contraception: none Last Pap: 3/13. Results were: normal Last mammogram: never.  Obstetric History OB History  Gravida Para Term Preterm AB SAB TAB Ectopic Multiple Living  0                  The following portions of the patient's history were reviewed and updated as appropriate: allergies, current medications, past family history, past medical history, past social history, past surgical history and problem list.  Review of Systems Pertinent items are noted in HPI.    Objective:    BP 127/86  Pulse 73  Temp(Src) 97.9 F (36.6 C) (Oral)  Ht 5' 2"  (1.575 m)  Wt 136.079 kg (300 lb)  BMI 54.86 kg/m2  LMP 01/17/2013 General appearance: alert, no distress and morbidly obese Lungs: clear to auscultation bilaterally Breasts: normal appearance, no masses or tenderness, Inspection negative, No nipple retraction or dimpling, No nipple discharge or bleeding, No axillary or supraclavicular adenopathy, Normal to palpation without dominant masses Heart: regular rate and rhythm, S1, S2 normal, no murmur, click, rub or gallop Abdomen: soft, non-tender; bowel sounds normal; no masses,  no organomegaly Pelvic: cervix normal in appearance, external genitalia normal, no adnexal masses or tenderness, no cervical motion tenderness, rectovaginal septum normal, uterus normal size, shape, and consistency and vagina normal without discharge    Assessment:    Healthy female exam.   Desires STD testing  Would love to get pregnant, but does not think she can  Plan:    Contraception: none.    Pap sent  STD testing  done  Diet and exercise discussed and tips given  Followup in one year or prn

## 2013-03-02 LAB — WET PREP, GENITAL
Trich, Wet Prep: NONE SEEN
WBC, Wet Prep HPF POC: NONE SEEN
YEAST WET PREP: NONE SEEN

## 2013-03-02 LAB — HEPATITIS B SURFACE ANTIGEN: Hepatitis B Surface Ag: NEGATIVE

## 2013-03-02 LAB — HIV ANTIBODY (ROUTINE TESTING W REFLEX): HIV: NONREACTIVE

## 2013-03-02 LAB — RPR

## 2013-03-06 ENCOUNTER — Telehealth: Payer: Self-pay | Admitting: *Deleted

## 2013-03-06 NOTE — Telephone Encounter (Signed)
Pt called nurse line requesting refill on prescription.

## 2013-03-07 ENCOUNTER — Encounter: Payer: Self-pay | Admitting: Advanced Practice Midwife

## 2013-03-07 DIAGNOSIS — B977 Papillomavirus as the cause of diseases classified elsewhere: Secondary | ICD-10-CM | POA: Insufficient documentation

## 2013-03-08 ENCOUNTER — Encounter: Payer: Self-pay | Admitting: Internal Medicine

## 2013-03-08 ENCOUNTER — Ambulatory Visit (INDEPENDENT_AMBULATORY_CARE_PROVIDER_SITE_OTHER): Payer: Medicaid Other | Admitting: Internal Medicine

## 2013-03-08 VITALS — BP 121/69 | HR 98 | Temp 97.8°F | Ht 62.0 in | Wt 302.9 lb

## 2013-03-08 DIAGNOSIS — H1010 Acute atopic conjunctivitis, unspecified eye: Secondary | ICD-10-CM | POA: Insufficient documentation

## 2013-03-08 DIAGNOSIS — A499 Bacterial infection, unspecified: Secondary | ICD-10-CM

## 2013-03-08 DIAGNOSIS — A6000 Herpesviral infection of urogenital system, unspecified: Secondary | ICD-10-CM

## 2013-03-08 DIAGNOSIS — IMO0002 Reserved for concepts with insufficient information to code with codable children: Secondary | ICD-10-CM

## 2013-03-08 DIAGNOSIS — B9689 Other specified bacterial agents as the cause of diseases classified elsewhere: Secondary | ICD-10-CM | POA: Insufficient documentation

## 2013-03-08 DIAGNOSIS — J45901 Unspecified asthma with (acute) exacerbation: Secondary | ICD-10-CM

## 2013-03-08 DIAGNOSIS — H1045 Other chronic allergic conjunctivitis: Secondary | ICD-10-CM

## 2013-03-08 DIAGNOSIS — E1165 Type 2 diabetes mellitus with hyperglycemia: Secondary | ICD-10-CM

## 2013-03-08 DIAGNOSIS — N760 Acute vaginitis: Secondary | ICD-10-CM

## 2013-03-08 DIAGNOSIS — IMO0001 Reserved for inherently not codable concepts without codable children: Secondary | ICD-10-CM

## 2013-03-08 LAB — GLUCOSE, CAPILLARY: GLUCOSE-CAPILLARY: 180 mg/dL — AB (ref 70–99)

## 2013-03-08 MED ORDER — METRONIDAZOLE 500 MG PO TABS: 500.0000 mg | ORAL_TABLET | Freq: Two times a day (BID) | ORAL | Status: AC

## 2013-03-08 MED ORDER — ACCU-CHEK FASTCLIX LANCETS MISC: 1.0000 | Freq: Two times a day (BID) | Status: DC

## 2013-03-08 MED ORDER — "INSULIN SYRINGE 28G X 1/2"" 1 ML MISC": Status: DC

## 2013-03-08 MED ORDER — ACYCLOVIR 400 MG PO TABS: 400.0000 mg | ORAL_TABLET | Freq: Two times a day (BID) | ORAL | Status: DC

## 2013-03-08 MED ORDER — GLUCOSE BLOOD VI STRP: 1.0000 | ORAL_STRIP | Freq: Two times a day (BID) | Status: DC

## 2013-03-08 MED ORDER — LORATADINE 10 MG PO TABS: 10.0000 mg | ORAL_TABLET | Freq: Every day | ORAL | Status: DC

## 2013-03-08 MED ORDER — INSULIN GLARGINE 100 UNIT/ML ~~LOC~~ SOLN: SUBCUTANEOUS | Status: DC

## 2013-03-08 MED ORDER — FLUTICASONE-SALMETEROL 100-50 MCG/DOSE IN AEPB: 1.0000 | INHALATION_SPRAY | Freq: Two times a day (BID) | RESPIRATORY_TRACT | Status: DC

## 2013-03-08 NOTE — Assessment & Plan Note (Addendum)
Assessment:  Itchy, watery eyes for the past six weeks that is improving.  No FB sensation, loss of visual acuity or pain.  No evidence of purulent discharge to suggest bacterial conjunctivitis.  She is not a contact lens wearer, has no FB sensation is able to open and close the eye without discomfort making keratitis unlikely.  She has bilateral, though right > left involvement, which favors viral or allergic conjunctivits.  Given her history of seasonal allergy and the fact that itching is her main complaint allergic conjunctivitis is most likely.    Plan:  1) OTC antihistamine eye drops             2) Follow-up with ophthalmology if symptoms worsen            3) Return to clinic if unable to see ophthalmologist

## 2013-03-08 NOTE — Assessment & Plan Note (Signed)
Patient with BV symptoms and clue cells on wet prep at OBGyn visit last week.  She says her OBGyn said she would prescribe antibiotic but her pharmacy never received the rx.  She has tried calling the OBGyn office but has not heard back from them.  I have sent rx for Flagyl 561m BID x 7 days.  Patient was advised not to drink alcohol while taking Flagyl.

## 2013-03-08 NOTE — Assessment & Plan Note (Signed)
Lab Results  Component Value Date   HGBA1C 7.8 11/22/2012   HGBA1C 9.2 08/22/2012   HGBA1C 13.5 04/27/2012     Assessment: Diabetes control:  uncontrolled Progress toward A1C goal:   deteriorating Comments: HgbA1c 9.7 today.  She admits to dietary indiscretions and continues to miss insulin 1-2 nights per week.  She did not bring her meter today.  Plan: Medications:  Continue Glipizide 4m BID and Lantus 92 units qHS Home glucose monitoring: Frequency:  3-4 times per day Timing:  including AM and HS Other plans: She agrees to check her blood sugars regularly for the next two weeks and return to clinic with the logs.  Will plan to adjust medications accordingly at that visit.

## 2013-03-08 NOTE — Progress Notes (Signed)
   Subjective:    Patient ID: Ana Long, female    DOB: 1975/10/23, 38 y.o.   MRN: [id number]  HPI Comments: Ana Long is a 38 year old with a PMH of DM type 2, asthma, seasonal allergy, OSA on CPAP, GERD and osteoarthritis who presents for routine follow-up and medication refill.  She also has complaint of B/L intensely pruritic, watery eye (right > left) for past six days.  She is not a contact lens wearer.  She denies pain with EOM or loss of vision.  She notes crusting upon waking in the morning.  Overall she feels her symptoms are resolving.    Regarding her DM:  Her HgbA1c was 7.8 three months ago.  Today it is 9.7.  She admits to dietary indiscretions last month while traveling and celebrating her birthday.  She says she skips her insulin 1-2 times per week because she does not want to take it if she does not eat dinner.  She reports compliance with Glipizide.         Review of Systems  Constitutional: Negative for fever, chills and appetite change.  Eyes: Positive for discharge, redness and itching. Negative for photophobia, pain and visual disturbance.  Respiratory: Negative for shortness of breath.   Cardiovascular: Negative for chest pain.  Gastrointestinal: Negative for nausea, vomiting and diarrhea.  Endocrine: Negative for polydipsia and polyuria.  Genitourinary: Negative for dysuria.       Objective:   Physical Exam  Constitutional: She is oriented to person, place, and time. No distress.  HENT:  Mouth/Throat: Oropharynx is clear and moist. No oropharyngeal exudate.  Eyes: EOM are normal. Pupils are equal, round, and reactive to light. Right eye exhibits no discharge. Left eye exhibits no discharge.  Conjunctiva minimally erythematous B/L (R > L) No purulent discharge evident.  Neck: Neck supple.  Cardiovascular: Normal rate, regular rhythm and normal heart sounds.   Pulmonary/Chest: Effort normal and breath sounds normal. No respiratory distress.  She has no wheezes. She has no rales.  Abdominal: Soft. Bowel sounds are normal. She exhibits no distension. There is no tenderness.  Neurological: She is alert and oriented to person, place, and time.  Skin: Skin is warm. She is not diaphoretic.  Psychiatric: She has a normal mood and affect. Her behavior is normal.          Assessment & Plan:  Please see problem based assessment and plan.

## 2013-03-08 NOTE — Patient Instructions (Signed)
1. Please continue allergy drops for your eye and see your eye doctor as soon as possible.  If your symptoms worsen or do not improve call the clinic.    2. Do not drink alcohol while taking Flagyl.  3.  Please take your insulin and glipizide as prescribed and eat regular meals.  Check your blood sugar 3 times per day (including morning and evening).  Write down what time you take you insulin and glipizide.  4. Please take all medications as prescribed.    5. If you have worsening of your symptoms or new symptoms arise, please call the clinic ([phone]), or go to the ER immediately if symptoms are severe.  6. Return to clinic in 2 weeks and please bring your meter.

## 2013-03-09 NOTE — Progress Notes (Signed)
I saw and evaluated the patient.  I personally confirmed the key portions of the history and exam documented by Dr. Redmond Pulling and I reviewed pertinent patient test results.  The assessment, diagnosis, and plan were formulated together and I agree with the documentation in the resident's note.

## 2013-03-21 ENCOUNTER — Ambulatory Visit: Payer: Medicaid Other | Admitting: Family Medicine

## 2013-03-22 ENCOUNTER — Encounter: Payer: Self-pay | Admitting: Internal Medicine

## 2013-03-22 ENCOUNTER — Ambulatory Visit (INDEPENDENT_AMBULATORY_CARE_PROVIDER_SITE_OTHER): Payer: Medicaid Other | Admitting: Internal Medicine

## 2013-03-22 VITALS — BP 121/85 | HR 84 | Temp 98.4°F | Ht 62.0 in | Wt 304.6 lb

## 2013-03-22 DIAGNOSIS — E785 Hyperlipidemia, unspecified: Secondary | ICD-10-CM

## 2013-03-22 DIAGNOSIS — IMO0002 Reserved for concepts with insufficient information to code with codable children: Secondary | ICD-10-CM

## 2013-03-22 DIAGNOSIS — Z23 Encounter for immunization: Secondary | ICD-10-CM

## 2013-03-22 DIAGNOSIS — E1165 Type 2 diabetes mellitus with hyperglycemia: Secondary | ICD-10-CM

## 2013-03-22 DIAGNOSIS — IMO0001 Reserved for inherently not codable concepts without codable children: Secondary | ICD-10-CM

## 2013-03-22 DIAGNOSIS — Z0000 Encounter for general adult medical examination without abnormal findings: Secondary | ICD-10-CM

## 2013-03-22 LAB — POCT GLYCOSYLATED HEMOGLOBIN (HGB A1C): Hemoglobin A1C: 9.7

## 2013-03-22 LAB — LIPID PANEL
CHOL/HDL RATIO: 3.4 ratio
CHOLESTEROL: 158 mg/dL (ref 0–200)
HDL: 47 mg/dL (ref >39)
LDL Cholesterol: 91 mg/dL (ref 0–99)
Triglycerides: 101 mg/dL (ref <150)
VLDL: 20 mg/dL (ref 0–40)

## 2013-03-22 LAB — GLUCOSE, CAPILLARY: Glucose-Capillary: 163 mg/dL — ABNORMAL HIGH (ref 70–99)

## 2013-03-22 NOTE — Patient Instructions (Addendum)
1. Please take your Lantus 92 units earlier at night so your do not forget it.  Take it at the same time every evening.  Please record your blood sugars (first thing in the morning, two times during the day and at night).  Keep a food diary and bring it and your meter to the next visit.    2. Please take all medications as prescribed.   3. If you have worsening of your symptoms or new symptoms arise, please call the clinic ([phone]), or go to the ER immediately if symptoms are severe.   4. Return in 4 weeks for follow-up.

## 2013-03-22 NOTE — Progress Notes (Signed)
   Subjective:    Patient ID: Oswaldo Conroy Sawin-Ward, female    DOB: Oct 06, 1975, 38 y.o.   MRN: [id number]  HPI Comments: [name] is a 38 year old with a PMH of DM type 2 (HgbA1c 9.7 Jan. 2015), asthma, seasonal allergy, OSA on CPAP, GERD and osteoarthritis who presents for routine follow-up of her DM.   She had been instructed to increase the frequency of BG checks and bring in her meter which she has done.  No documented low blood sugars but she reports one episode of hypoglycemia in the two weeks since she saw me.  This occurred after not eating for the entire day and she was away from home so was unable to check her BG at the time to confirm it.  She did immediately eat something sweet and felt better.   Per meter download, her fasting BGs are 161-195.  Nighttime highs average in the upper 200s.  Average BG is 229.  She take Lantus 92 units around 11PM and has missed Lantus once since last visit (this is an improvement for her). She takes glipizide two 12m tablets at night instead of the prescribed 520mBID (she prefers to take all her medications at one time).  She is still not eating breakfast often and her first meal of the day may not occur until after 12PM even though she has been awake since 5:45AM.  She has met with the diabetes educator and continues to work on dietary changes.       Review of Systems  Constitutional: Negative for fever, chills and appetite change.  Eyes: Negative for visual disturbance.  Respiratory: Negative for shortness of breath.   Cardiovascular: Negative for chest pain.  Gastrointestinal: Positive for constipation. Negative for diarrhea.       Occasional constipation only with Tramadol use.  Genitourinary: Negative for dysuria.       Objective:   Physical Exam  Vitals reviewed. Constitutional: She is oriented to person, place, and time. No distress.  Cardiovascular: Normal rate, regular rhythm and normal heart sounds.   Pulmonary/Chest: Effort  normal and breath sounds normal. No respiratory distress. She has no wheezes. She has no rales.  Abdominal: Soft. Bowel sounds are normal. She exhibits no distension. There is no tenderness.  Musculoskeletal: She exhibits edema.  1+ B/L pretibial edema  Neurological: She is alert and oriented to person, place, and time.  Skin: Skin is warm. She is not diaphoretic.  Psychiatric: She has a normal mood and affect. Her behavior is normal.          Assessment & Plan:  Please see problem based assessment and plan.

## 2013-03-23 NOTE — Assessment & Plan Note (Signed)
Flu vaccine given at this visit.

## 2013-03-23 NOTE — Assessment & Plan Note (Addendum)
Lipid Panel     Component Value Date/Time   CHOL 158 03/22/2013 1621   TRIG 101 03/22/2013 1621   HDL 47 03/22/2013 1621   CHOLHDL 3.4 03/22/2013 1621   VLDL 20 03/22/2013 1621   LDLCALC 91 03/22/2013 1621    Lipid panel completed at visit.  Since she is not yet 38 no statin indicated.  Her 10 year ASCVD risk is 1.2% (at age 38) with her current lipid panel.  Once she turns 38 (in 10 months) she will require moderate intensity statin since she has DM.

## 2013-03-23 NOTE — Assessment & Plan Note (Addendum)
Lab Results  Component Value Date   HGBA1C 9.7 03/08/2013   HGBA1C 7.8 11/22/2012   HGBA1C 9.2 08/22/2012     Assessment: Diabetes control: slightly improved based on meter downloads (avg BG 229) Progress toward A1C goal:   slightly improved Comments: She has brought in her meter download and improved Lantus compliance which is a big step for her.  I advised her to move her Lantus to earlier in the evening so she does not fall asleep before taking it.  She does not want to move it to the AM because she takes all of her other medications at night.  Plan: Medications:  Continue Lantus 92 units qPM (earlier in the PM ~ 9PM).  Continue Glipizide 37m nightly since she is less likely to take it BID as prescribed.  Will need to focus on improving diet (small frequent meals) and weight loss.  She has Metformin allergy.  GLP1 receptor agonist could be an option in the future as it may help with weight loss and less likely to cause hypoglycemia.  Will hold off on switching from Glipizide to GLP1 receptor agonist, i.e. Byetta since it is twice daily and she will be unlikely to comply with BID.  Cost would also likely be an issue with this drug class. Home glucose monitoring: Frequency:  4 times per day Timing:  include fasting AM and qHS Instruction/counseling given: Advised to eat small, frequent meals and check BG more frequently.  She continues to follow with DM educator.  Advised to keep a food log and bring it with her meter to next visit. Educational resources provided: handout Other plans: Return in 1 month for DM follow-up.  Will need urine microalb:Cr in March 2015.

## 2013-03-24 NOTE — Progress Notes (Signed)
Case discussed with Dr. Wilson at the time of the visit.  We reviewed the resident's history and exam and pertinent patient test results.  I agree with the assessment, diagnosis, and plan of care documented in the resident's note. 

## 2013-04-11 ENCOUNTER — Encounter: Payer: Medicaid Other | Admitting: Internal Medicine

## 2013-04-11 ENCOUNTER — Encounter: Payer: Self-pay | Admitting: Internal Medicine

## 2013-04-11 ENCOUNTER — Ambulatory Visit (INDEPENDENT_AMBULATORY_CARE_PROVIDER_SITE_OTHER): Payer: Medicaid Other | Admitting: Internal Medicine

## 2013-04-11 VITALS — BP 109/56 | HR 87 | Temp 97.8°F | Ht 60.1 in | Wt 307.7 lb

## 2013-04-11 DIAGNOSIS — J309 Allergic rhinitis, unspecified: Secondary | ICD-10-CM

## 2013-04-11 DIAGNOSIS — IMO0001 Reserved for inherently not codable concepts without codable children: Secondary | ICD-10-CM

## 2013-04-11 DIAGNOSIS — M179 Osteoarthritis of knee, unspecified: Secondary | ICD-10-CM

## 2013-04-11 DIAGNOSIS — E1165 Type 2 diabetes mellitus with hyperglycemia: Secondary | ICD-10-CM

## 2013-04-11 DIAGNOSIS — IMO0002 Reserved for concepts with insufficient information to code with codable children: Secondary | ICD-10-CM

## 2013-04-11 DIAGNOSIS — J302 Other seasonal allergic rhinitis: Secondary | ICD-10-CM

## 2013-04-11 DIAGNOSIS — M1710 Unilateral primary osteoarthritis, unspecified knee: Secondary | ICD-10-CM

## 2013-04-11 LAB — GLUCOSE, CAPILLARY: GLUCOSE-CAPILLARY: 128 mg/dL — AB (ref 70–99)

## 2013-04-11 MED ORDER — FLUTICASONE PROPIONATE 50 MCG/ACT NA SUSP: 1.0000 | Freq: Every day | NASAL | Status: DC

## 2013-04-11 NOTE — Assessment & Plan Note (Signed)
She reports nasal congestion related to seasonal allergies.  No fevers/chills, cough or dyspnea.  Rx for flonase provided.

## 2013-04-11 NOTE — Assessment & Plan Note (Signed)
Lab Results  Component Value Date   HGBA1C 9.7 03/08/2013   HGBA1C 7.8 11/22/2012   HGBA1C 9.2 08/22/2012     Assessment: Diabetes control:  improving Progress toward A1C goal:   improving based on BG readings Comments: Her average BG is 207 based on meter download.  No lows.  She has made significant dietary changes - now eating three meals daily and reduced sugar sweetened beverages (says she has had three since visit last month).  She has increased frequency of BG checks.    Plan: Medications:  Will continue Lantus 92 units qPM and Glipizide 21m BID.  Will re-assess at next visit and if she continues to improve can likely d/c glipizide. Home glucose monitoring: Frequency:  4 times per day Timing:  include fasting AM and qHS Instruction/counseling given: Continue three meals daily, avoid sugar-sweetened beverages and work on weight loss. Educational resources provided: brochure Self management tools provided: copy of home glucose meter download Other plans: Return for A1c check in May.  Return earlier if BG running high or low at home.

## 2013-04-11 NOTE — Progress Notes (Signed)
   Subjective:    Patient ID: Oswaldo Conroy McDougaldWard, female    DOB: Dec 20, 1975, 38 y.o.   MRN: [id number]  HPI Comments:  [name] is a 38 year old with a PMH of DM type 2 (HgbA1c 9.7 Jan. 2015), asthma, seasonal allergy, OSA on CPAP, GERD and osteoarthritis who presents for routine follow-up of her DM.  She has increased frequency of BG checks, now checking at least twice daily.  Has missed Lantus twice in the past 1 month, an improvement for her.  She is taking glipizide BID now (originally prescribed BID but she was taking both pills at night).  Has felt hypoglycemic twice in the past in month, did not check her BG but immediately ate something sweet.  No BG below 132 per meter.  She has cut back on sugar sweetened beverages.  She has improved her diet - now eating three meals per day.     Review of Systems  Constitutional: Negative for fever, chills and appetite change.  HENT: Positive for congestion.   Respiratory: Negative for shortness of breath.   Cardiovascular: Negative for chest pain and palpitations.  Gastrointestinal: Positive for constipation. Negative for nausea, vomiting, diarrhea and blood in stool.  Genitourinary: Negative for dysuria.  Musculoskeletal: Positive for arthralgias.       Knee pain 2/2 to OA        Objective:   Physical Exam  Vitals reviewed. Constitutional: She is oriented to person, place, and time. No distress.  Eyes: Pupils are equal, round, and reactive to light.  Cardiovascular: Normal rate, regular rhythm and normal heart sounds.   Pulmonary/Chest: Effort normal and breath sounds normal. No respiratory distress. She has no wheezes. She has no rales.  Abdominal: Soft. Bowel sounds are normal. She exhibits no distension. There is no tenderness.  Musculoskeletal:  B/L knee: FROM, TTP, +joint crepitus; Trace B/L pretibial edema  Neurological: She is alert and oriented to person, place, and time.  Skin: Skin is warm and dry. She is not  diaphoretic.  Psychiatric: She has a normal mood and affect. Her behavior is normal.          Assessment & Plan:  Please see problem based assessment and plan.

## 2013-04-11 NOTE — Assessment & Plan Note (Addendum)
Chronic pain and crepitus in B/L knee joints.  Painful ROM.  She ambulates with help of a cane.  Currently taking lots of ibuprofen to help with the pain along with Tramadol occasionally.  Tylenol has helped in the past. Has benefited from steroid injection in the past.   Plan:  1) She was advised to take a break from NSAID and try Tylenol.            2) Refer to Sports Medicine for possible injection

## 2013-04-11 NOTE — Patient Instructions (Signed)
1. Continue Lantus 92 units every evening.  Take glipizide 18m BID.  Continue dietary changes - 3 meals per day and drinking more water - Keep up the good work!   2. Please take all medications as prescribed. I have sent a prescription for Flonase to your pharmacy to help with your allergy symptoms.   3. You will be contacted with the date for your Sports Medicine appointment.   4. If you have worsening of your symptoms or new symptoms arise, please call the clinic [phone], or go to the ER immediately if symptoms are severe.   Return to clinic in April for HgbA1c check.  Please bring your meter with you.

## 2013-04-12 NOTE — Progress Notes (Signed)
Case discussed with Dr. Wilson at the time of the visit.  We reviewed the resident's history and exam and pertinent patient test results.  I agree with the assessment, diagnosis, and plan of care documented in the resident's note. 

## 2013-04-16 ENCOUNTER — Ambulatory Visit: Payer: Medicaid Other | Admitting: Family Medicine

## 2013-04-17 ENCOUNTER — Ambulatory Visit: Payer: Medicaid Other | Admitting: Family Medicine

## 2013-04-23 ENCOUNTER — Encounter: Payer: Self-pay | Admitting: Family Medicine

## 2013-04-23 ENCOUNTER — Ambulatory Visit: Payer: Medicaid Other | Admitting: Family Medicine

## 2013-04-23 ENCOUNTER — Ambulatory Visit (INDEPENDENT_AMBULATORY_CARE_PROVIDER_SITE_OTHER): Payer: Medicaid Other | Admitting: Family Medicine

## 2013-04-23 VITALS — BP 124/79 | HR 93 | Ht 62.0 in | Wt 309.0 lb

## 2013-04-23 DIAGNOSIS — M1710 Unilateral primary osteoarthritis, unspecified knee: Secondary | ICD-10-CM

## 2013-04-23 DIAGNOSIS — M179 Osteoarthritis of knee, unspecified: Secondary | ICD-10-CM

## 2013-04-23 DIAGNOSIS — IMO0002 Reserved for concepts with insufficient information to code with codable children: Secondary | ICD-10-CM

## 2013-04-24 ENCOUNTER — Encounter: Payer: Self-pay | Admitting: Family Medicine

## 2013-04-24 NOTE — Progress Notes (Signed)
Patient ID: Ana Long, female   DOB: 08/19/1975, 38 y.o.   MRN: [id number]  PCP: Duwaine Maxin, DO  Subjective:   HPI: Patient is a 38 y.o. female here for bilateral knee pain.  7/23: Patient reports 3-5 years of right knee pain. Had an injection in October 2013 that helped a lot until past 2 months when pain worsened again. Has been doing more cleaning and remodeling of her home. Has difficulty sleeping due to pain. Feels like it's giving out on her. Uses cane. Difficulty getting comfortable. Worse with twisting.  12/1: Patient reports injection helped up until about 2 weeks ago. Swelling. Taking ibuprofen and tramadol for pain. Using cane still. No new injuries, changes. No catching, locking. Giving out sensation at times.  04/23/13: Patient returns today stating she would like to try knee braces. Last injection right knee 12/1 helped some but pain bad again. Taking naproxen and tramadol. Did PT in past. Right knee worse than left. Worse with cold weather.  Past Medical History  Diagnosis Date  . GERD (gastroesophageal reflux disease)   . Diabetes mellitus     intolerant to Metformin  . Herpes genital   . PCOS (polycystic ovarian syndrome)   . Morbid obesity with BMI of 50 to 60 07/23/2009  . Dry skin     hands   . Asthma     prn inhaler  . Arthritis     major joints  . Complication of anesthesia     states is hard to wake up post-op  . Family history of anesthesia complication     mother went into coma during c-section  . Sleep apnea, obstructive     uses CPAP nightly - had sleep study 08/22/2007  . Carpal tunnel syndrome of right wrist 09/2012  . Obesity hypoventilation syndrome 10/30/2012  . OSA on CPAP      AHI was on  11-17-12 to 120/hr, titrated to 15 cm with 3 cm EPR/     Current Outpatient Prescriptions on File Prior to Visit  Medication Sig Dispense Refill  . ACCU-CHEK FASTCLIX LANCETS MISC 1 each by Other route 2 (two) times daily.  102  each  5  . acyclovir (ZOVIRAX) 400 MG tablet Take 1 tablet (400 mg total) by mouth 2 (two) times daily.  60 tablet  3  . albuterol (VENTOLIN HFA) 108 (90 BASE) MCG/ACT inhaler Inhale 1-2 puffs into the lungs every 4 (four) hours as needed for wheezing or shortness of breath.  1 Inhaler  11  . famotidine (PEPCID) 40 MG tablet Take 40 mg by mouth daily.      . fluticasone (FLONASE) 50 MCG/ACT nasal spray Place 1 spray into both nostrils daily.  16 g  2  . Fluticasone-Salmeterol (ADVAIR DISKUS) 100-50 MCG/DOSE AEPB Inhale 1 puff into the lungs 2 (two) times daily.  1 each  3  . glipiZIDE (GLUCOTROL) 5 MG tablet TAKE ONE TABLET BY MOUTH TWICE DAILY  60 tablet  11  . glucosamine-chondroitin 500-400 MG tablet Take 2 tablets by mouth daily.       Marland Kitchen glucose blood (ACCU-CHEK SMARTVIEW) test strip 1 each by Other route 2 (two) times daily.  100 each  5  . insulin glargine (LANTUS) 100 UNIT/ML injection INJECT 92 UNITS INTO THE ABDOMEN AT BEDTIME  3 vial  3  . Insulin Syringe-Needle U-100 (INSULIN SYRINGE 1CC/28G) 28G X 1/2" 1 ML MISC Use to administer insulin daily. Dx code: 39.02. Insulin dependent.  100 each  3  .  loratadine (CLARITIN) 10 MG tablet Take 1 tablet (10 mg total) by mouth daily.  30 tablet  3  . naproxen sodium (ANAPROX) 220 MG tablet Take 220 mg by mouth 2 (two) times daily with a meal.      . Probiotic Product (PROBIOTIC DAILY PO) Take by mouth.      . traMADol (ULTRAM) 50 MG tablet Take 1 tablet (50 mg total) by mouth every 6 (six) hours as needed.  45 tablet  0   No current facility-administered medications on file prior to visit.    Past Surgical History  Procedure Laterality Date  . Hysteroscopy w/d&c  01-08-2002  . Carpal tunnel release Right 10/02/2012    Procedure: RIGHT CARPAL TUNNEL RELEASE ENDOSCOPIC;  Surgeon: Jolyn Nap, MD;  Location: [facility];  Service: Orthopedics;  Laterality: Right;    Allergies  Allergen Reactions  . Citrus Hives and Itching   . Latex Swelling  . Penicillins Hives and Itching  . Soap Hives, Itching and Swelling    PUREX LAUNDRY DETERGENT  . Tomato Hives and Itching  . Doxycycline Other (See Comments)    AFFECTS JOINTS  . Metformin And Related Rash    GI UPSET    History   Social History  . Marital Status: Divorced    Spouse Name: N/A    Number of Children: 2  . Years of Education: 14   Occupational History  . UNEMPLOYED    Social History Main Topics  . Smoking status: Former Smoker -- 10 years    Quit date: 05/24/2010  . Smokeless tobacco: Never Used  . Alcohol Use: Yes     Comment: rarely  . Drug Use: No  . Sexual Activity: Not on file     Comment: Lives w/a partner   Other Topics Concern  . Not on file   Social History Narrative   Single, but lives with a partner   Has 2 children and cares for an infant during the day          Family History  Problem Relation Age of Onset  . Other Mother   . Diabetes Maternal Aunt   . Heart attack Maternal Aunt   . Alzheimer's disease Maternal Uncle   . Cancer Maternal Uncle     prostate  . Diabetes Paternal Aunt   . Cancer Paternal Aunt     brain,mouth  . Alzheimer's disease Paternal Aunt   . Diabetes Paternal Uncle   . Diabetes Maternal Grandmother   . Diabetes Maternal Grandfather   . Leukemia Cousin   . Other Other   . Obesity Other   . Other Other     BP 124/79  Pulse 93  Ht 5' 2"  (1.575 m)  Wt 309 lb (140.161 kg)  BMI 56.50 kg/m2  Review of Systems: See HPI above.    Objective:  Physical Exam:  Gen: NAD  L knee: No gross deformity, ecchymoses.  Mild effusion. TTP medial and lateral joint lines.  TTP post patellar facets as well. FROM. Negative ant/post drawers. Negative valgus/varus testing. Negative lachmanns. Pain medially with mcmurrays, apleys.  Negative apprehension. NV intact distally.  R knee: No gross deformity, ecchymoses.  Mild effusion. TTP medial > lateral joint lines.  TTP post patellar facets as  well. FROM. Negative ant/post drawers. Negative valgus/varus testing. Negative lachmanns. Pain medially with mcmurrays, apleys. Negative apprehension. NV intact distally.    Assessment & Plan:  1. Right knee pain - known advanced DJD of left  knee (and likely the same with right knee) despite fairly normal radiographs. Declined repeat injection today or left knee injection today.  Again reviewed tylenol, aleve, glucosamine, capsaicin.  Done PT in past.  Had discussion about total knee replacement.  Viscosupplementation not covered by insurance.  Knee braces for support.

## 2013-04-25 NOTE — Assessment & Plan Note (Signed)
known advanced DJD of left knee (and likely the same with right knee) despite fairly normal radiographs. Declined repeat injection today or left knee injection today.  Again reviewed tylenol, aleve, glucosamine, capsaicin.  Done PT in past.  Had discussion about total knee replacement.  Viscosupplementation not covered by insurance.  Knee braces for support.

## 2013-05-16 ENCOUNTER — Encounter: Payer: Self-pay | Admitting: Neurology

## 2013-06-14 ENCOUNTER — Other Ambulatory Visit: Payer: Self-pay | Admitting: Advanced Practice Midwife

## 2013-06-14 ENCOUNTER — Other Ambulatory Visit: Payer: Self-pay | Admitting: Obstetrics and Gynecology

## 2013-06-18 ENCOUNTER — Ambulatory Visit (INDEPENDENT_AMBULATORY_CARE_PROVIDER_SITE_OTHER): Payer: Medicaid Other | Admitting: Family Medicine

## 2013-06-18 VITALS — BP 109/73 | HR 88 | Ht 61.0 in | Wt 309.0 lb

## 2013-06-18 DIAGNOSIS — M79644 Pain in right finger(s): Secondary | ICD-10-CM

## 2013-06-18 DIAGNOSIS — M79609 Pain in unspecified limb: Secondary | ICD-10-CM

## 2013-06-18 NOTE — Patient Instructions (Signed)
You have elements of both trigger finger (primary issue) and dequervains tenosynovitis. Wear thumb spica brace and wrap of the joint of the thumb at bedtime and as often as possible during the day. Consider injection, prednisone dose pack. Continue your naproxen. Follow up with me in 6 weeks or sooner if you want to try injection, prednisone (call for prednisone).

## 2013-06-19 ENCOUNTER — Encounter: Payer: Self-pay | Admitting: Family Medicine

## 2013-06-19 DIAGNOSIS — M79644 Pain in right finger(s): Secondary | ICD-10-CM | POA: Insufficient documentation

## 2013-06-19 NOTE — Assessment & Plan Note (Signed)
most consistent with trigger finger and mild dequervains tenosynovitis.  Start with thumb spica brace and splinting of IP joint of thumb.  Offered repeat trigger finger injection, prednisone dose pack which she declined.  F/u in 6 weeks or earlier if she wants to go ahead with injection

## 2013-06-19 NOTE — Progress Notes (Signed)
Patient ID: Ana Long, female   DOB: 12-02-1975, 38 y.o.   MRN: [id number]  PCP: Duwaine Maxin, DO  Subjective:   HPI: Patient is a 38 y.o. female here for right thumb/wrist pain.  Patient denies known injury. She had carpal tunnel release done on 10/02/12 by [facility] but current issue feels different. Describes a locking of her thumb with pain that started near A1 pulley. Has since progressed to entire thumb and goes up forearm on radial side. Had what sounds like a trigger finger injection at [employer] - this helped for a month. Taking naproxen, glucosamine.  Past Medical History  Diagnosis Date  . GERD (gastroesophageal reflux disease)   . Diabetes mellitus     intolerant to Metformin  . Herpes genital   . PCOS (polycystic ovarian syndrome)   . Morbid obesity with BMI of 50 to 60 07/23/2009  . Dry skin     hands   . Asthma     prn inhaler  . Arthritis     major joints  . Complication of anesthesia     states is hard to wake up post-op  . Family history of anesthesia complication     mother went into coma during c-section  . Sleep apnea, obstructive     uses CPAP nightly - had sleep study 08/22/2007  . Carpal tunnel syndrome of right wrist 09/2012  . Obesity hypoventilation syndrome 10/30/2012  . OSA on CPAP      AHI was on  11-17-12 to 120/hr, titrated to 15 cm with 3 cm EPR/     Current Outpatient Prescriptions on File Prior to Visit  Medication Sig Dispense Refill  . ACCU-CHEK FASTCLIX LANCETS MISC 1 each by Other route 2 (two) times daily.  102 each  5  . acyclovir (ZOVIRAX) 400 MG tablet Take 1 tablet (400 mg total) by mouth 2 (two) times daily.  60 tablet  3  . albuterol (VENTOLIN HFA) 108 (90 BASE) MCG/ACT inhaler Inhale 1-2 puffs into the lungs every 4 (four) hours as needed for wheezing or shortness of breath.  1 Inhaler  11  . famotidine (PEPCID) 40 MG tablet Take 40 mg by mouth daily.      . fluconazole (DIFLUCAN) 150 MG tablet  Take one tablet by mouth once  as needed for yeast infections  2 tablet  5  . fluticasone (FLONASE) 50 MCG/ACT nasal spray Place 1 spray into both nostrils daily.  16 g  2  . Fluticasone-Salmeterol (ADVAIR DISKUS) 100-50 MCG/DOSE AEPB Inhale 1 puff into the lungs 2 (two) times daily.  1 each  3  . glipiZIDE (GLUCOTROL) 5 MG tablet TAKE ONE TABLET BY MOUTH TWICE DAILY  60 tablet  11  . glucosamine-chondroitin 500-400 MG tablet Take 2 tablets by mouth daily.       Marland Kitchen glucose blood (ACCU-CHEK SMARTVIEW) test strip 1 each by Other route 2 (two) times daily.  100 each  5  . insulin glargine (LANTUS) 100 UNIT/ML injection INJECT 92 UNITS INTO THE ABDOMEN AT BEDTIME  3 vial  3  . Insulin Syringe-Needle U-100 (INSULIN SYRINGE 1CC/28G) 28G X 1/2" 1 ML MISC Use to administer insulin daily. Dx code: 61.02. Insulin dependent.  100 each  3  . loratadine (CLARITIN) 10 MG tablet Take 1 tablet (10 mg total) by mouth daily.  30 tablet  3  . naproxen sodium (ANAPROX) 220 MG tablet Take 220 mg by mouth 2 (two) times daily with a meal.      .  Probiotic Product (PROBIOTIC DAILY PO) Take by mouth.      . traMADol (ULTRAM) 50 MG tablet Take 1 tablet (50 mg total) by mouth every 6 (six) hours as needed.  45 tablet  0   No current facility-administered medications on file prior to visit.    Past Surgical History  Procedure Laterality Date  . Hysteroscopy w/d&c  01-08-2002  . Carpal tunnel release Right 10/02/2012    Procedure: RIGHT CARPAL TUNNEL RELEASE ENDOSCOPIC;  Surgeon: Jolyn Nap, MD;  Location: [place];  Service: Orthopedics;  Laterality: Right;    Allergies  Allergen Reactions  . Citrus Hives and Itching  . Latex Swelling  . Penicillins Hives and Itching  . Soap Hives, Itching and Swelling    PUREX LAUNDRY DETERGENT  . Tomato Hives and Itching  . Doxycycline Other (See Comments)    AFFECTS JOINTS  . Metformin And Related Rash    GI UPSET    History   Social History  .  Marital Status: Divorced    Spouse Name: N/A    Number of Children: 2  . Years of Education: 14   Occupational History  . UNEMPLOYED    Social History Main Topics  . Smoking status: Former Smoker -- 10 years    Quit date: 05/24/2010  . Smokeless tobacco: Never Used  . Alcohol Use: Yes     Comment: rarely  . Drug Use: No  . Sexual Activity: Not on file     Comment: Lives w/a partner   Other Topics Concern  . Not on file   Social History Narrative   Single, but lives with a partner   Has 2 children and cares for an infant during the day          Family History  Problem Relation Age of Onset  . Other Mother   . Diabetes Maternal Aunt   . Heart attack Maternal Aunt   . Alzheimer's disease Maternal Uncle   . Cancer Maternal Uncle     prostate  . Diabetes Paternal Aunt   . Cancer Paternal Aunt     brain,mouth  . Alzheimer's disease Paternal Aunt   . Diabetes Paternal Uncle   . Diabetes Maternal Grandmother   . Diabetes Maternal Grandfather   . Leukemia Cousin   . Other Other   . Obesity Other   . Other Other     BP 109/73  Pulse 88  Ht 5' 1"  (1.549 m)  Wt 309 lb (140.161 kg)  BMI 58.41 kg/m2  Review of Systems: See HPI above.    Objective:  Physical Exam:  Gen: NAD  Right hand/wrist: No gross deformity, swelling, bruising.  Locking with 1st IP joint flexion. TTP greatest at A1 pulley, less 1st dorsal compartment.  No other thumb, hand, wrist tenderness. Able to flex and extend at 1st MCP and IP joint Collateral ligaments intact of IP, MCP joints of 1st digit. NVI distally. Negative tinels. Negative finkelsteins Negative phalens.    Assessment & Plan:  1. Right thumb pain - most consistent with trigger finger and mild dequervains tenosynovitis.  Start with thumb spica brace and splinting of IP joint of thumb.  Offered repeat trigger finger injection, prednisone dose pack which she declined.  F/u in 6 weeks or earlier if she wants to go ahead with  injection

## 2013-07-11 ENCOUNTER — Encounter: Payer: Medicaid Other | Admitting: Internal Medicine

## 2013-07-11 ENCOUNTER — Encounter: Payer: Self-pay | Admitting: Internal Medicine

## 2013-07-24 ENCOUNTER — Other Ambulatory Visit: Payer: Self-pay | Admitting: Internal Medicine

## 2013-07-25 ENCOUNTER — Ambulatory Visit (INDEPENDENT_AMBULATORY_CARE_PROVIDER_SITE_OTHER): Payer: Medicaid Other | Admitting: Internal Medicine

## 2013-07-25 ENCOUNTER — Observation Stay (HOSPITAL_COMMUNITY): Payer: Medicaid Other

## 2013-07-25 ENCOUNTER — Encounter: Payer: Self-pay | Admitting: Internal Medicine

## 2013-07-25 ENCOUNTER — Other Ambulatory Visit: Payer: Self-pay | Admitting: *Deleted

## 2013-07-25 ENCOUNTER — Observation Stay (HOSPITAL_COMMUNITY)
Admission: AD | Admit: 2013-07-25 | Discharge: 2013-07-27 | Disposition: A | Discharge status: Home / Self Care | Payer: Medicaid Other | Source: Ambulatory Visit | Attending: Internal Medicine | Admitting: Internal Medicine

## 2013-07-25 VITALS — BP 146/97 | HR 98 | Temp 97.5°F | Wt 309.2 lb

## 2013-07-25 DIAGNOSIS — E282 Polycystic ovarian syndrome: Secondary | ICD-10-CM | POA: Insufficient documentation

## 2013-07-25 DIAGNOSIS — K219 Gastro-esophageal reflux disease without esophagitis: Secondary | ICD-10-CM | POA: Insufficient documentation

## 2013-07-25 DIAGNOSIS — J45901 Unspecified asthma with (acute) exacerbation: Principal | ICD-10-CM | POA: Insufficient documentation

## 2013-07-25 DIAGNOSIS — M129 Arthropathy, unspecified: Secondary | ICD-10-CM | POA: Insufficient documentation

## 2013-07-25 DIAGNOSIS — K5900 Constipation, unspecified: Secondary | ICD-10-CM | POA: Insufficient documentation

## 2013-07-25 DIAGNOSIS — E1165 Type 2 diabetes mellitus with hyperglycemia: Secondary | ICD-10-CM | POA: Diagnosis present

## 2013-07-25 DIAGNOSIS — IMO0002 Reserved for concepts with insufficient information to code with codable children: Secondary | ICD-10-CM

## 2013-07-25 DIAGNOSIS — Z6841 Body Mass Index (BMI) 40.0 and over, adult: Secondary | ICD-10-CM | POA: Insufficient documentation

## 2013-07-25 DIAGNOSIS — E662 Morbid (severe) obesity with alveolar hypoventilation: Secondary | ICD-10-CM | POA: Insufficient documentation

## 2013-07-25 DIAGNOSIS — N181 Chronic kidney disease, stage 1: Secondary | ICD-10-CM

## 2013-07-25 DIAGNOSIS — Z87891 Personal history of nicotine dependence: Secondary | ICD-10-CM | POA: Insufficient documentation

## 2013-07-25 DIAGNOSIS — G4733 Obstructive sleep apnea (adult) (pediatric): Secondary | ICD-10-CM | POA: Insufficient documentation

## 2013-07-25 DIAGNOSIS — E785 Hyperlipidemia, unspecified: Secondary | ICD-10-CM

## 2013-07-25 DIAGNOSIS — Z9989 Dependence on other enabling machines and devices: Secondary | ICD-10-CM

## 2013-07-25 DIAGNOSIS — J302 Other seasonal allergic rhinitis: Secondary | ICD-10-CM | POA: Diagnosis present

## 2013-07-25 DIAGNOSIS — IMO0001 Reserved for inherently not codable concepts without codable children: Secondary | ICD-10-CM | POA: Insufficient documentation

## 2013-07-25 DIAGNOSIS — Z794 Long term (current) use of insulin: Secondary | ICD-10-CM | POA: Insufficient documentation

## 2013-07-25 HISTORY — DX: Other seasonal allergic rhinitis: J30.2

## 2013-07-25 HISTORY — DX: Unspecified asthma with (acute) exacerbation: J45.901

## 2013-07-25 LAB — BASIC METABOLIC PANEL WITH GFR
BUN: 10 mg/dL (ref 6–23)
CO2: 24 meq/L (ref 19–32)
Calcium: 9.3 mg/dL (ref 8.4–10.5)
Chloride: 99 mEq/L (ref 96–112)
Creat: 0.55 mg/dL (ref 0.50–1.10)
GFR, Est Non African American: >89 mL/min
GLUCOSE: 240 mg/dL — AB (ref 70–99)
POTASSIUM: 3.9 meq/L (ref 3.5–5.3)
Sodium: 137 mEq/L (ref 135–145)

## 2013-07-25 LAB — CBC
HEMATOCRIT: 35.5 % — AB (ref 36.0–46.0)
Hemoglobin: 11.8 g/dL — ABNORMAL LOW (ref 12.0–15.0)
MCH: 26.3 pg (ref 26.0–34.0)
MCHC: 33.2 g/dL (ref 30.0–36.0)
MCV: 79.2 fL (ref 78.0–100.0)
PLATELETS: 299 10*3/uL (ref 150–400)
RBC: 4.48 MIL/uL (ref 3.87–5.11)
RDW: 15 % (ref 11.5–15.5)
WBC: 6.6 10*3/uL (ref 4.0–10.5)

## 2013-07-25 LAB — CBC WITH DIFFERENTIAL/PLATELET
Basophils Absolute: 0 10*3/uL (ref 0.0–0.1)
Basophils Relative: 1 % (ref 0–1)
Eosinophils Absolute: 0.2 10*3/uL (ref 0.0–0.7)
Eosinophils Relative: 4 % (ref 0–5)
HCT: 35.7 % — ABNORMAL LOW (ref 36.0–46.0)
HEMOGLOBIN: 11.9 g/dL — AB (ref 12.0–15.0)
Lymphocytes Relative: 39 % (ref 12–46)
Lymphs Abs: 2.2 10*3/uL (ref 0.7–4.0)
MCH: 26.2 pg (ref 26.0–34.0)
MCHC: 33.3 g/dL (ref 30.0–36.0)
MCV: 78.6 fL (ref 78.0–100.0)
MONOS PCT: 8 % (ref 3–12)
Monocytes Absolute: 0.5 10*3/uL (ref 0.1–1.0)
NEUTROS ABS: 2.8 10*3/uL (ref 1.7–7.7)
Neutrophils Relative %: 48 % (ref 43–77)
Platelets: 289 10*3/uL (ref 150–400)
RBC: 4.54 MIL/uL (ref 3.87–5.11)
RDW: 14.9 % (ref 11.5–15.5)
WBC: 5.8 10*3/uL (ref 4.0–10.5)

## 2013-07-25 LAB — POCT GLYCOSYLATED HEMOGLOBIN (HGB A1C): HEMOGLOBIN A1C: 8.8

## 2013-07-25 LAB — CREATININE, SERUM: CREATININE: 0.55 mg/dL (ref 0.50–1.10)

## 2013-07-25 LAB — GLUCOSE, CAPILLARY
GLUCOSE-CAPILLARY: 186 mg/dL — AB (ref 70–99)
GLUCOSE-CAPILLARY: 338 mg/dL — AB (ref 70–99)
Glucose-Capillary: 245 mg/dL — ABNORMAL HIGH (ref 70–99)

## 2013-07-25 MED ORDER — INSULIN ASPART 100 UNIT/ML ~~LOC~~ SOLN
0.0000 [IU] | Freq: Three times a day (TID) | SUBCUTANEOUS | Status: DC
  Administered 2013-07-26 (×3): 20 [IU] via SUBCUTANEOUS
  Administered 2013-07-27: 7 [IU] via SUBCUTANEOUS
  Administered 2013-07-27: 20 [IU] via SUBCUTANEOUS

## 2013-07-25 MED ORDER — ACETAMINOPHEN 650 MG RE SUPP
650.0000 mg | Freq: Four times a day (QID) | RECTAL | Status: DC | PRN
Start: 2013-07-25 — End: 2013-07-27

## 2013-07-25 MED ORDER — ACETAMINOPHEN 325 MG PO TABS
650.0000 mg | ORAL_TABLET | Freq: Four times a day (QID) | ORAL | Status: DC | PRN
Start: 2013-07-25 — End: 2013-07-27

## 2013-07-25 MED ORDER — ONDANSETRON HCL 4 MG/2ML IJ SOLN: 4.0000 mg | Freq: Four times a day (QID) | INTRAMUSCULAR | Status: DC | PRN

## 2013-07-25 MED ORDER — INSULIN ASPART 100 UNIT/ML ~~LOC~~ SOLN: 0.0000 [IU] | Freq: Every day | SUBCUTANEOUS | Status: DC

## 2013-07-25 MED ORDER — ONDANSETRON HCL 4 MG PO TABS
4.0000 mg | ORAL_TABLET | Freq: Four times a day (QID) | ORAL | Status: DC | PRN
Start: 2013-07-25 — End: 2013-07-27

## 2013-07-25 MED ORDER — TRAMADOL HCL 50 MG PO TABS
50.0000 mg | ORAL_TABLET | Freq: Four times a day (QID) | ORAL | Status: DC | PRN
  Administered 2013-07-25 – 2013-07-27 (×2): 50 mg via ORAL
  Filled 2013-07-25 (×2): qty 1

## 2013-07-25 MED ORDER — ALBUTEROL SULFATE (2.5 MG/3ML) 0.083% IN NEBU
2.5000 mg | INHALATION_SOLUTION | Freq: Four times a day (QID) | RESPIRATORY_TRACT | Status: DC
Start: 2013-07-25 — End: 2013-07-25
  Administered 2013-07-25: 2.5 mg via RESPIRATORY_TRACT
  Filled 2013-07-25: qty 3

## 2013-07-25 MED ORDER — ALBUTEROL SULFATE HFA 108 (90 BASE) MCG/ACT IN AERS: 1.0000 | INHALATION_SPRAY | RESPIRATORY_TRACT | Status: DC | PRN

## 2013-07-25 MED ORDER — IPRATROPIUM BROMIDE 0.02 % IN SOLN
0.5000 mg | Freq: Four times a day (QID) | RESPIRATORY_TRACT | Status: DC
  Administered 2013-07-25: 0.5 mg via RESPIRATORY_TRACT
  Filled 2013-07-25: qty 2.5

## 2013-07-25 MED ORDER — FLUTICASONE PROPIONATE 50 MCG/ACT NA SUSP
1.0000 | Freq: Every day | NASAL | Status: DC
  Administered 2013-07-26: 1 via NASAL
  Filled 2013-07-25: qty 16

## 2013-07-25 MED ORDER — LORATADINE 10 MG PO TABS
10.0000 mg | ORAL_TABLET | Freq: Every day | ORAL | Status: DC
  Administered 2013-07-26 – 2013-07-27 (×2): 10 mg via ORAL
  Filled 2013-07-25 (×2): qty 1

## 2013-07-25 MED ORDER — SODIUM CHLORIDE 0.9 % IJ SOLN
3.0000 mL | Freq: Two times a day (BID) | INTRAMUSCULAR | Status: DC
  Administered 2013-07-25 – 2013-07-26 (×3): 3 mL via INTRAVENOUS

## 2013-07-25 MED ORDER — DOCUSATE SODIUM 100 MG PO CAPS
100.0000 mg | ORAL_CAPSULE | Freq: Two times a day (BID) | ORAL | Status: DC
  Administered 2013-07-25 – 2013-07-27 (×4): 100 mg via ORAL
  Filled 2013-07-25 (×6): qty 1

## 2013-07-25 MED ORDER — ALBUTEROL SULFATE (2.5 MG/3ML) 0.083% IN NEBU
2.5000 mg | INHALATION_SOLUTION | Freq: Once | RESPIRATORY_TRACT | Status: AC
  Administered 2013-07-25: 2.5 mg via RESPIRATORY_TRACT

## 2013-07-25 MED ORDER — SODIUM CHLORIDE 0.9 % IV SOLN: 250.0000 mL | INTRAVENOUS | Status: DC | PRN

## 2013-07-25 MED ORDER — INSULIN GLARGINE 100 UNIT/ML ~~LOC~~ SOLN
92.0000 [IU] | Freq: Every day | SUBCUTANEOUS | Status: DC
  Administered 2013-07-25 – 2013-07-26 (×2): 92 [IU] via SUBCUTANEOUS
  Filled 2013-07-25 (×3): qty 0.92

## 2013-07-25 MED ORDER — SALINE SPRAY 0.65 % NA SOLN
1.0000 | Freq: Four times a day (QID) | NASAL | Status: DC
  Administered 2013-07-25 – 2013-07-27 (×8): 1 via NASAL
  Filled 2013-07-25: qty 44

## 2013-07-25 MED ORDER — ENOXAPARIN SODIUM 40 MG/0.4ML ~~LOC~~ SOLN
40.0000 mg | SUBCUTANEOUS | Status: DC
  Administered 2013-07-25 – 2013-07-26 (×2): 40 mg via SUBCUTANEOUS
  Filled 2013-07-25 (×3): qty 0.4

## 2013-07-25 MED ORDER — IPRATROPIUM BROMIDE 0.02 % IN SOLN
0.5000 mg | Freq: Once | RESPIRATORY_TRACT | Status: AC
  Administered 2013-07-25: 0.5 mg via RESPIRATORY_TRACT

## 2013-07-25 MED ORDER — SODIUM CHLORIDE 0.9 % IJ SOLN: 3.0000 mL | INTRAMUSCULAR | Status: DC | PRN

## 2013-07-25 MED ORDER — SODIUM CHLORIDE 0.9 % IJ SOLN: 3.0000 mL | Freq: Two times a day (BID) | INTRAMUSCULAR | Status: DC

## 2013-07-25 MED ORDER — PREDNISONE 20 MG PO TABS
40.0000 mg | ORAL_TABLET | Freq: Every day | ORAL | Status: DC
  Administered 2013-07-26 – 2013-07-27 (×2): 40 mg via ORAL
  Filled 2013-07-25 (×3): qty 2

## 2013-07-25 MED ORDER — FAMOTIDINE 40 MG PO TABS
40.0000 mg | ORAL_TABLET | Freq: Every day | ORAL | Status: DC
  Administered 2013-07-26 – 2013-07-27 (×2): 40 mg via ORAL
  Filled 2013-07-25 (×2): qty 1

## 2013-07-25 MED ORDER — GUAIFENESIN-DM 100-10 MG/5ML PO SYRP
5.0000 mL | ORAL_SOLUTION | ORAL | Status: DC | PRN
  Administered 2013-07-25: 5 mL via ORAL
  Filled 2013-07-25: qty 5

## 2013-07-25 MED ORDER — IPRATROPIUM-ALBUTEROL 0.5-2.5 (3) MG/3ML IN SOLN
3.0000 mL | Freq: Four times a day (QID) | RESPIRATORY_TRACT | Status: DC
  Administered 2013-07-26 (×2): 3 mL via RESPIRATORY_TRACT
  Filled 2013-07-25 (×2): qty 3

## 2013-07-25 MED ORDER — ALBUTEROL SULFATE (2.5 MG/3ML) 0.083% IN NEBU
2.5000 mg | INHALATION_SOLUTION | RESPIRATORY_TRACT | Status: DC | PRN
  Administered 2013-07-25 – 2013-07-26 (×2): 2.5 mg via RESPIRATORY_TRACT
  Filled 2013-07-25 (×2): qty 3

## 2013-07-25 MED ORDER — METHYLPREDNISOLONE SODIUM SUCC 125 MG IJ SOLR
125.0000 mg | Freq: Once | INTRAMUSCULAR | Status: AC
  Administered 2013-07-25: 125 mg via INTRAMUSCULAR

## 2013-07-25 NOTE — Progress Notes (Signed)
Subjective:    Patient ID: Ana Long, female    DOB: 09-20-1975, 38 y.o.   MRN: [id number]  HPI Ana Long is a 38 yo woman pmh as listed below presents for cough.   Pt states that cough started 4 days ago, productive of clear to some yellow colored sputum, nothing seems to relieve the pain and pt has tried tussin DM, nyquil, 10 pills of claritin, patanase, and flonase with little to no relief. She has been exposed to some sick children that have similar symptoms. Her other biggest concern is nasal congestion that has even limited her ability to wear her nasal cpap at night. Her coughing spells have caused some dizziness and HA. She also having some otalgia but no tinnitus or hearing loss. She also states being out of her albuterol rescue inhaler for about 2 weeks and therefore has been using her daughter's albuterol inhaler anywhere between 2-4 times per day. She is finding little to minimal relief with this. The patient does not have nebulizer treatments at home.  Past Medical History  Diagnosis Date  . GERD (gastroesophageal reflux disease)   . Diabetes mellitus     intolerant to Metformin  . Herpes genital   . PCOS (polycystic ovarian syndrome)   . Morbid obesity with BMI of 50 to 60 07/23/2009  . Dry skin     hands   . Asthma     prn inhaler  . Arthritis     major joints  . Complication of anesthesia     states is hard to wake up post-op  . Family history of anesthesia complication     mother went into coma during c-section  . Sleep apnea, obstructive     uses CPAP nightly - had sleep study 08/22/2007  . Carpal tunnel syndrome of right wrist 09/2012  . Obesity hypoventilation syndrome 10/30/2012  . OSA on CPAP      AHI was on  11-17-12 to 120/hr, titrated to 15 cm with 3 cm EPR/    Current Outpatient Prescriptions on File Prior to Visit  Medication Sig Dispense Refill  . ACCU-CHEK FASTCLIX LANCETS MISC 1 each by Other route 2 (two) times daily.  102 each  5  .  acyclovir (ZOVIRAX) 400 MG tablet Take 1 tablet (400 mg total) by mouth 2 (two) times daily.  60 tablet  3  . famotidine (PEPCID) 40 MG tablet Take 40 mg by mouth daily.      . fluconazole (DIFLUCAN) 150 MG tablet Take one tablet by mouth once  as needed for yeast infections  2 tablet  5  . fluticasone (FLONASE) 50 MCG/ACT nasal spray Place 1 spray into both nostrils daily.  16 g  2  . Fluticasone-Salmeterol (ADVAIR DISKUS) 100-50 MCG/DOSE AEPB Inhale 1 puff into the lungs 2 (two) times daily.  1 each  3  . glipiZIDE (GLUCOTROL) 5 MG tablet TAKE ONE TABLET BY MOUTH TWICE DAILY  60 tablet  11  . glucosamine-chondroitin 500-400 MG tablet Take 2 tablets by mouth daily.       Marland Kitchen glucose blood (ACCU-CHEK SMARTVIEW) test strip 1 each by Other route 2 (two) times daily.  100 each  5  . insulin glargine (LANTUS) 100 UNIT/ML injection INJECT 92 UNITS INTO THE ABDOMEN AT BEDTIME  3 vial  3  . Insulin Syringe-Needle U-100 (INSULIN SYRINGE 1CC/28G) 28G X 1/2" 1 ML MISC Use to administer insulin daily. Dx code: 33.02. Insulin dependent.  100 each  3  .  loratadine (CLARITIN) 10 MG tablet TAKE ONE TABLET BY MOUTH ONE TIME DAILY   30 tablet  3  . naproxen sodium (ANAPROX) 220 MG tablet Take 220 mg by mouth 2 (two) times daily with a meal.      . Probiotic Product (PROBIOTIC DAILY PO) Take by mouth.      . traMADol (ULTRAM) 50 MG tablet Take 1 tablet (50 mg total) by mouth every 6 (six) hours as needed.  45 tablet  0   No current facility-administered medications on file prior to visit.   Social, surgical, family history reviewed with patient and updated in appropriate chart locations.  Review of Systems  History obtained from the patient ENT ROS: positive for - headaches, nasal congestion, nasal discharge and sore throat negative for - epistaxis, hearing change, oral lesions, sneezing, tinnitus, vertigo or visual changes Allergy and Immunology ROS: positive for - nasal congestion, postnasal drip and seasonal  allergies negative for - hives Respiratory ROS: positive for - cough, shortness of breath, sputum changes, tachypnea and wheezing negative for - hemoptysis, pleuritic pain or stridor Cardiovascular ROS: positive for - dyspnea on exertion and shortness of breath negative for - chest pain, edema or irregular heartbeat Gastrointestinal ROS: no abdominal pain, change in bowel habits, or black or bloody stools Neurological ROS: negative for - bowel and bladder control changes or numbness/tingling     Objective:   Physical Exam Filed Vitals:   07/25/13 1326  BP: 146/97  Pulse: 98  Temp: 97.5 F (36.4 C)   General: sitting in chair, NAD HEENT: PERRL, EOMI, no scleral icterus, TM pearly gray clear fluid behind no pus or erythema, no cervical, maxillary, or periauricular LAD, MMM, oropharynx no lesions, pus or tonsillar exudates Cardiac: RRR, no rubs, murmurs or gallops Pulm: peak flow before treatment: 250s, moving decreased volumes of air, no frank crackles or wheezes, pt cough extensively during exam, after treatment: peak flow: 300 and still little air movement  Abd: soft, nontender, nondistended, BS present Ext: warm and well perfused, no pedal edema Neuro: alert and oriented X3, cranial nerves II-XII grossly intact    Assessment & Plan:  Please see problem oriented charting. Secondary to poor improvement of pt peak flows and respiratory status decision was made for admission for asthma exacerbation.   Pt discussed with Dr. Ellwood Dense

## 2013-07-25 NOTE — H&P (Signed)
  I have seen and examined the patient myself, and I have reviewed the note by Arrie Aran, MS 3 and was present during the interview and physical exam.  Please see my separate H&P for additional findings, assessment, and plan.   Signed: Joni Reining, DO 07/25/2013, 7:06 PM

## 2013-07-25 NOTE — Progress Notes (Signed)
Pt has home cpap and will place herself on when ready for bed. Water added for comfort per pt request. Will continue to monitor.

## 2013-07-25 NOTE — H&P (Signed)
Date: 07/25/2013               Patient Name:  Ana Long MRN: [id number]  DOB: 09-15-1975 Age / Sex: 38 y.o., female   PCP: Duwaine Maxin, DO              Medical Service: Internal Medicine Teaching Service              Attending Physician: Dr. Bartholomew Crews, MD    First Contact: Arrie Aran, MS 3 Pager: [phone]  Second Contact: Dr. Joni Reining Pager: [phone]  Third Contact Dr. Randell Loop   Pager: [phone]       After Hours (After 5p/  First Contact Pager: [phone]  weekends / holidays): Second Contact Pager: [phone]   Chief Complaint: cough, difficulty breathing  History of Present Illness:  Ana Long presented to the IM clinic today with 4 days of difficulty breathing and cough, which was mildly productive. She has had recent exposure to sick children. It gets worse with minimal physical activity, and leaning forward.  She has tried tussin, nyquil, loratidine, patanase, singulair, and flonase with no relief.  She had a coughing spell while driving, which forced her to pull over, and convinced her to see the doctor.  She has associated HA, otalgia (R>L), postnasal pruritis, and difficulty sleeping.  She has also had some chest pain on the right sternal border, which is worse with coughing and not associated with exertion.  She denies changes in vision, palpitation, tachycardia, fever, or increased LE edema.  She has been using albuterol a couple times per day.  She has been hospitalized for asthma exacerbation in the past, the last time being in 2010, and states this episode seems similar to past.  She has never been intubated for asthma exacerbation.   Meds: No current facility-administered medications for this encounter.   Current Outpatient Prescriptions  Medication Sig Dispense Refill  . ACCU-CHEK FASTCLIX LANCETS MISC 1 each by Other route 2 (two) times daily.  102 each  5  . acyclovir (ZOVIRAX) 400 MG tablet Take 1 tablet (400 mg total) by mouth  2 (two) times daily.  60 tablet  3  . albuterol (VENTOLIN HFA) 108 (90 BASE) MCG/ACT inhaler Inhale 1-2 puffs into the lungs every 4 (four) hours as needed for wheezing or shortness of breath.  1 Inhaler  5  . famotidine (PEPCID) 40 MG tablet Take 40 mg by mouth daily.      . fluconazole (DIFLUCAN) 150 MG tablet Take one tablet by mouth once  as needed for yeast infections  2 tablet  5  . fluticasone (FLONASE) 50 MCG/ACT nasal spray Place 1 spray into both nostrils daily.  16 g  2  . Fluticasone-Salmeterol (ADVAIR DISKUS) 100-50 MCG/DOSE AEPB Inhale 1 puff into the lungs 2 (two) times daily.  1 each  3  . glipiZIDE (GLUCOTROL) 5 MG tablet TAKE ONE TABLET BY MOUTH TWICE DAILY  60 tablet  11  . glucosamine-chondroitin 500-400 MG tablet Take 2 tablets by mouth daily.       Marland Kitchen glucose blood (ACCU-CHEK SMARTVIEW) test strip 1 each by Other route 2 (two) times daily.  100 each  5  . insulin glargine (LANTUS) 100 UNIT/ML injection INJECT 92 UNITS INTO THE ABDOMEN AT BEDTIME  3 vial  3  . Insulin Syringe-Needle U-100 (INSULIN SYRINGE 1CC/28G) 28G X 1/2" 1 ML MISC Use to administer insulin daily. Dx code: 57.02. Insulin dependent.  100 each  3  .  loratadine (CLARITIN) 10 MG tablet TAKE ONE TABLET BY MOUTH ONE TIME DAILY   30 tablet  3  . naproxen sodium (ANAPROX) 220 MG tablet Take 220 mg by mouth 2 (two) times daily with a meal.      . Probiotic Product (PROBIOTIC DAILY PO) Take by mouth.      . traMADol (ULTRAM) 50 MG tablet Take 1 tablet (50 mg total) by mouth every 6 (six) hours as needed.  45 tablet  0    Allergies: Allergies as of 07/25/2013 - Review Complete 06/19/2013  Allergen Reaction Noted  . Citrus Hives and Itching 07/20/2012  . Latex Swelling 07/20/2012  . Penicillins Hives and Itching 01/01/2006  . Soap Hives, Itching, and Swelling 07/20/2012  . Tomato Hives and Itching 07/20/2012  . Doxycycline Other (See Comments) 01/01/2006  . Metformin and related Rash 12/13/2010   Past Medical  History  Diagnosis Date  . GERD (gastroesophageal reflux disease)   . Diabetes mellitus     intolerant to Metformin  . Herpes genital   . PCOS (polycystic ovarian syndrome)   . Morbid obesity with BMI of 50 to 60 07/23/2009  . Dry skin     hands   . Asthma     prn inhaler  . Arthritis     major joints  . Complication of anesthesia     states is hard to wake up post-op  . Family history of anesthesia complication     mother went into coma during c-section  . Sleep apnea, obstructive     uses CPAP nightly - had sleep study 08/22/2007  . Carpal tunnel syndrome of right wrist 09/2012  . Obesity hypoventilation syndrome 10/30/2012  . OSA on CPAP      AHI was on  11-17-12 to 120/hr, titrated to 15 cm with 3 cm EPR/    Past Surgical History  Procedure Laterality Date  . Hysteroscopy w/d&c  01-08-2002  . Carpal tunnel release Right 10/02/2012    Procedure: RIGHT CARPAL TUNNEL RELEASE ENDOSCOPIC;  Surgeon: Jolyn Nap, MD;  Location: [facility];  Service: Orthopedics;  Laterality: Right;   Family History  Problem Relation Age of Onset  . Other Mother   . Diabetes Maternal Aunt   . Heart attack Maternal Aunt   . Alzheimer's disease Maternal Uncle   . Cancer Maternal Uncle     prostate  . Diabetes Paternal Aunt   . Cancer Paternal Aunt     brain,mouth  . Alzheimer's disease Paternal Aunt   . Diabetes Paternal Uncle   . Diabetes Maternal Grandmother   . Diabetes Maternal Grandfather   . Leukemia Cousin   . Other Other   . Obesity Other   . Other Other    History   Social History  . Marital Status: Divorced    Spouse Name: N/A    Number of Children: 2  . Years of Education: 14   Occupational History  . UNEMPLOYED    Social History Main Topics  . Smoking status: Former Smoker -- 10 years    Quit date: 05/24/2010  . Smokeless tobacco: Never Used  . Alcohol Use: Yes     Comment: rarely  . Drug Use: No  . Sexual Activity: Not on file     Comment: Lives  w/a partner   Other Topics Concern  . Not on file   Social History Narrative   Single, but lives with a partner   Has 2 children and cares for  an infant during the day          Review of Systems: A comprehensive review of systems was negative except for: Ears, nose, mouth, throat, and face: positive for earaches, hearing loss, nasal congestion and itchy throat Respiratory: positive for asthma, cough, pleurisy/chest pain and sputum Gastrointestinal: positive for constipation Musculoskeletal: positive for bone pain and myalgias Allergic/Immunologic: positive for hay fever  Physical Exam: BP: 146/97  Pulse: 98  Temp: 97.5 F (36.4 C)   General appearance: alert, cooperative, no distress and morbidly obese Head: Normocephalic, without obvious abnormality, atraumatic Ears: abnormal TM right ear - erythematous Nose: right turbinate red, edematous Throat: lips, mucosa, and tongue normal; teeth and gums normal and some postnasal drainage Neck: no adenopathy and supple, symmetrical, trachea midline Lungs: distant breath sounds, normal work of breathing, no wheezes or crackles appreciated, though body habitus complicated exam  peak flow before treatment: 250s, after treatment: 290's Heart: regular rate and rhythm and distant heart sounds Extremities: edema 1+ Pulses: 2+ and symmetric Neurologic: Grossly normal     Lab results: Results for PENDA, VENTURI (MRN [id number]) as of 07/25/2013 17:08  Ref. Range 07/25/2013 15:31  Sodium Latest Range: 135-145 mEq/L 137  Potassium Latest Range: 3.5-5.3 mEq/L 3.9  Chloride Latest Range: 96-112 mEq/L 99  CO2 Latest Range: 19-32 mEq/L 24  BUN Latest Range: 6-23 mg/dL 10  Creatinine Latest Range: 0.50-1.10 mg/dL 0.55  Calcium Latest Range: 8.4-10.5 mg/dL 9.3  Glucose Latest Range: 70-99 mg/dL 240 (H)  GFR, Est African American No range found >89  GFR, Est Non African American No range found >89   Results for TERRINA, DOCTER  (MRN [id number]) as of 07/25/2013 17:08  Ref. Range 07/25/2013 15:31  WBC Latest Range: 4.0-10.5 K/uL 5.8  RBC Latest Range: 3.87-5.11 MIL/uL 4.54  Hemoglobin Latest Range: 12.0-15.0 g/dL 11.9 (L)  HCT Latest Range: 36.0-46.0 % 35.7 (L)  MCV Latest Range: 78.0-100.0 fL 78.6  MCH Latest Range: 26.0-34.0 pg 26.2  MCHC Latest Range: 30.0-36.0 g/dL 33.3  RDW Latest Range: 11.5-15.5 % 14.9  Platelets Latest Range: 150-400 K/uL 289     Assessment & Plan by Problem: Ana Long is a 38yo morbidly obese woman with PMH of asthma, DM, OSA, and GERD, who presents with a 4-day history of cough and shortness of breath, admitted for suspected asthma exacerbation.  Dyspnea, with cough - Differential includes asthma exacerbation, pneumonia, and OHS.  Her history of asthma, with report that this episode is similar to past, and improvement of PEF with duoneb treatment make asthma exacerbation most likely.  Pneumonia is possible given productive cough and recent sick contacts, but she has been afebrile, so this is less likely.  She has never had hypercarbemia to support OHS as a diagnosis, though her body habitus puts her at high risk. - admit to telemetry with continuous pulse ox - obtain CXR to r/o pneumonia - obtain respiratory virus panel - consult respiratory care team for asthma exacerbation, appreciate rec's  - droplet precautions until r/o infectious process - Duoneb q6h - albuterol q2h PRN - flonase daily - robitussin for cough - continue home loratidine 26m daily - Saline nasal spray q6h -zofran 415mPRN  - daily BMP  Chest Pain - Likely costochondritis triggered by frequent coughing spells.  Her DM puts her at increased risk of CAD, so angina is also a possibility, but it is not associated with exertion, and does not have the character that is typical for angina.  - tylenol 65026m  q6h PRN  DM2 - Current steroid therapy is expected complicate control of her BG.   - continue home lantus  92U qhs - resistant SSI with qhs and meal coverage  OSA  - Continue home CPAP  Constipation - Colace 148m BID  GERD - pepcid 428mdaily  Arthritis - continue home tramadol 5066m6h PRN for severe pain  FEN: Medlock  DVT PPx: Lovenox  - obtain baseline CBC, Creatinine  Diet: Carb Modified   Code Status: Full   Dispo: Disposition is deferred at this time, awaiting improvement of current medical problems. Anticipated discharge in approximately 1 day(s).    This is a [employer] information officerte.  The care of the patient was discussed with Drs. KenHayes Ludwigd HofKentond the assessment and plan was formulated with their assistance.  Please see their note for official documentation of the patient encounter.   Signed: MarBarbie Haggised Student 07/25/2013, 4:30 PM

## 2013-07-25 NOTE — H&P (Signed)
Date: 07/25/2013               Patient Name:  Ana Long MRN: [id number]  DOB: 29-Apr-1975 Age / Sex: 38 y.o., female   PCP: Duwaine Maxin, DO         Medical Service: Internal Medicine Teaching Service         Attending Physician: Dr. Bartholomew Crews, MD    First Contact: Dr. Joni Reining Pager: [phone]  Second Contact: Dr. Randell Loop Pager: [phone]       After Hours (After 5p/  First Contact Pager: [phone]  weekends / holidays): Second Contact Pager: [phone]   Chief Complaint: SOB  History of Present Illness: Ana Long is a 37 yo AA female with PMH of Asthma, OSA, Obesity, GERD, Allergic rhinitis. She presented to the [facility] today for an acute visit with CC of cough.  She reports that she has had a 4 day history of productive cough with clear sputum.  She notes minimal improvement with tussin DM, nyquill, claritin, patanase, or flonase.  She reports some associated dizziness when she has episdoes of excessive coughing.  She also has some right ear pain, headache, chest pain with deep inspiration, nasal congestion.  She has run out of her albuterol inhaler but has been using her daughter's inhaler 2-4 times a day without much relief.  She reports she had not been using Advair until the last 4 days.  She notes visiting a day care prior to onset of symptoms where there were sick children.  In the clinic she was noted to have decreased air movement without frank wheezing.  A peak flow was measured at 250 (age expected 29) prior to treatment. Post treatment her peak flow was measured at 300.  She was directly admitted from the clinic for asthma exacerbation.     Meds: Current Facility-Administered Medications  Medication Dose Route Frequency Provider Last Rate Last Dose  . 0.9 %  sodium chloride infusion  250 mL Intravenous PRN Joni Reining, DO      . acetaminophen (TYLENOL) tablet 650 mg  650 mg Oral Q6H PRN Joni Reining, DO       Or  . acetaminophen  (TYLENOL) suppository 650 mg  650 mg Rectal Q6H PRN Joni Reining, DO      . albuterol (PROVENTIL) (2.5 MG/3ML) 0.083% nebulizer solution 2.5 mg  2.5 mg Nebulization Q6H Joni Reining, DO      . albuterol (PROVENTIL) (2.5 MG/3ML) 0.083% nebulizer solution 2.5 mg  2.5 mg Nebulization Q2H PRN Joni Reining, DO      . docusate sodium (COLACE) capsule 100 mg  100 mg Oral BID Joni Reining, DO      . enoxaparin (LOVENOX) injection 40 mg  40 mg Subcutaneous Q24H Joni Reining, DO      . famotidine (PEPCID) tablet 40 mg  40 mg Oral Daily Joni Reining, DO      . fluticasone (FLONASE) 50 MCG/ACT nasal spray 1 spray  1 spray Each Nare Daily Joni Reining, DO      . guaiFENesin-dextromethorphan (ROBITUSSIN DM) 100-10 MG/5ML syrup 5 mL  5 mL Oral Q4H PRN Joni Reining, DO      . [START ON 07/26/2013] insulin aspart (novoLOG) injection 0-20 Units  0-20 Units Subcutaneous TID WC Joni Reining, DO      . insulin aspart (novoLOG) injection 0-5 Units  0-5 Units Subcutaneous QHS Joni Reining, DO      . insulin glargine (LANTUS) injection 92 Units  92 Units Subcutaneous QHS Joni Reining, DO      . ipratropium (ATROVENT) nebulizer solution 0.5 mg  0.5 mg Nebulization Q6H Joni Reining, DO      . loratadine (CLARITIN) tablet 10 mg  10 mg Oral Daily Joni Reining, DO      . ondansetron [facility]) tablet 4 mg  4 mg Oral Q6H PRN Joni Reining, DO       Or  . ondansetron (ZOFRAN) injection 4 mg  4 mg Intravenous Q6H PRN Joni Reining, DO      . Derrill Memo ON 07/26/2013] predniSONE (DELTASONE) tablet 40 mg  40 mg Oral Q breakfast Joni Reining, DO      . sodium chloride (OCEAN) 0.65 % nasal spray 1 spray  1 spray Each Nare QID Joni Reining, DO      . sodium chloride 0.9 % injection 3 mL  3 mL Intravenous Q12H Joni Reining, DO      . sodium chloride 0.9 % injection 3 mL  3 mL Intravenous Q12H Joni Reining, DO      . sodium chloride 0.9 % injection 3 mL  3 mL Intravenous PRN Joni Reining, DO      . traMADol Veatrice Bourbon) tablet 50 mg  50 mg Oral Q6H  PRN Joni Reining, DO        Allergies: Allergies as of 07/25/2013 - Review Complete 07/25/2013  Allergen Reaction Noted  . Citrus Hives and Itching 07/20/2012  . Latex Swelling 07/20/2012  . Penicillins Hives and Itching 01/01/2006  . Soap Hives, Itching, and Swelling 07/20/2012  . Tomato Hives and Itching 07/20/2012  . Doxycycline Other (See Comments) 01/01/2006  . Metformin and related Rash 12/13/2010   Past Medical History  Diagnosis Date  . GERD (gastroesophageal reflux disease)   . Diabetes mellitus     intolerant to Metformin  . Herpes genital   . PCOS (polycystic ovarian syndrome)   . Morbid obesity with BMI of 50 to 60 07/23/2009  . Dry skin     hands   . Asthma     prn inhaler  . Arthritis     major joints  . Complication of anesthesia     states is hard to wake up post-op  . Family history of anesthesia complication     mother went into coma during c-section  . Sleep apnea, obstructive     uses CPAP nightly - had sleep study 08/22/2007  . Carpal tunnel syndrome of right wrist 09/2012  . Obesity hypoventilation syndrome 10/30/2012  . OSA on CPAP      AHI was on  11-17-12 to 120/hr, titrated to 15 cm with 3 cm EPR/    Past Surgical History  Procedure Laterality Date  . Hysteroscopy w/d&c  01-08-2002  . Carpal tunnel release Right 10/02/2012    Procedure: RIGHT CARPAL TUNNEL RELEASE ENDOSCOPIC;  Surgeon: Jolyn Nap, MD;  Location: [place];  Service: Orthopedics;  Laterality: Right;   Family History  Problem Relation Age of Onset  . Other Mother   . Diabetes Maternal Aunt   . Heart attack Maternal Aunt   . Alzheimer's disease Maternal Uncle   . Cancer Maternal Uncle     prostate  . Diabetes Paternal Aunt   . Cancer Paternal Aunt     brain,mouth  . Alzheimer's disease Paternal Aunt   . Diabetes Paternal Uncle   . Diabetes Maternal Grandmother   . Diabetes Maternal Grandfather   . Leukemia Cousin   . Other Other   .  Obesity Other   .  Other Other    History   Social History  . Marital Status: Divorced    Spouse Name: N/A    Number of Children: 2  . Years of Education: 14   Occupational History  . UNEMPLOYED    Social History Main Topics  . Smoking status: Former Smoker -- 10 years    Quit date: 05/24/2010  . Smokeless tobacco: Never Used  . Alcohol Use: Yes     Comment: rarely  . Drug Use: No  . Sexual Activity: Not on file     Comment: Lives w/a partner   Other Topics Concern  . Not on file   Social History Narrative   Single, but lives with a partner   Has 2 children and cares for an infant during the day          Review of Systems: Review of Systems  Constitutional: Positive for malaise/fatigue. Negative for fever and chills.  HENT: Positive for congestion, ear pain and hearing loss (possibly some decreased hearing). Negative for ear discharge, nosebleeds, sore throat and tinnitus.   Eyes: Negative for blurred vision, double vision and pain.  Respiratory: Positive for cough, sputum production and shortness of breath. Negative for hemoptysis, wheezing and stridor.   Cardiovascular: Positive for chest pain (with deep inspiration improved with duoneb treatment). Negative for palpitations and leg swelling.  Gastrointestinal: Positive for constipation. Negative for heartburn, nausea, vomiting, abdominal pain and diarrhea.  Genitourinary: Negative for dysuria, urgency and frequency.  Musculoskeletal: Positive for back pain. Negative for myalgias.  Neurological: Positive for dizziness and headaches. Negative for focal weakness.  Psychiatric/Behavioral: Negative for substance abuse.     Physical Exam: Blood pressure 112/70, pulse 93, temperature 98.7 F (37.1 C), temperature source Oral, resp. rate 16, height 5' 1.02" (1.55 m), weight 309 lb 4.9 oz (140.3 kg), SpO2 100.00%. Physical Exam  Nursing note and vitals reviewed. Constitutional: She is oriented to person, place, and time and well-developed,  well-nourished, and in no distress.  HENT:  Head: Normocephalic and atraumatic.  Right Ear: There is tenderness. Tympanic membrane is not injected, not perforated, not erythematous, not retracted and not bulging.  Left Ear: Tympanic membrane and ear canal normal.  Nose: Mucosal edema and rhinorrhea present. No septal deviation. No epistaxis.  Mouth/Throat: Uvula is midline and mucous membranes are normal. Posterior oropharyngeal edema present. No oropharyngeal exudate or posterior oropharyngeal erythema.  Mild irritation or right external auditory canal. Inferior turbinate hypertrophy + post nasal drip  Cardiovascular: Normal rate, regular rhythm, normal heart sounds and intact distal pulses.   No murmur heard. Pulmonary/Chest: Effort normal. No respiratory distress. She has no wheezes.  Speaking in full sentences, good air movement bilaterally No accessory muscle use. Peak flow 300  Abdominal: Soft. Bowel sounds are normal. She exhibits no distension. There is no tenderness. There is no rebound.  Musculoskeletal: She exhibits edema (trace pedal).  Lymphadenopathy:    She has no cervical adenopathy.  Neurological: She is alert and oriented to person, place, and time.  Skin: Skin is warm and dry.  Psychiatric: Affect and judgment normal.     Lab results: Basic Metabolic Panel:  Recent Labs  07/25/13 1531  NA 137  K 3.9  CL 99  CO2 24  GLUCOSE 240*  BUN 10  CREATININE 0.55  CALCIUM 9.3   CBC:  Recent Labs  07/25/13 1531  WBC 5.8  NEUTROABS 2.8  HGB 11.9*  HCT 35.7*  MCV 78.6  PLT 289   CBG:  Recent Labs  07/25/13 1448 07/25/13 1735  GLUCAP 245* 186*   Hemoglobin A1C:  Recent Labs  07/25/13 1500  HGBA1C 8.8   Urine Drug Screen: Drugs of Abuse     Component Value Date/Time   LABOPIA NEGATIVE 07/01/2009 2049   COCAINSCRNUR NEGATIVE 07/01/2009 2049   LABBENZ  Value: POSITIVE (NOTE) Result repeated and verified. Sent for confirmatory testing* 07/01/2009  2049   AMPHETMU NEGATIVE 07/01/2009 2049    Imaging results:  No results found.  Assessment & Plan by Problem:   Asthma with acute exacerbation - Patient presented with cough and SOB, PEF was ~60% of expected. No wheezing heard (but poor air flow) on presentation to clinic but patient had O2 sat of ~90-91%.  She improved some with Duoneb treatment and IV solumedrol. But was still symptomatic after treatment. - Admit to telemetry - Supplemental O2 to maintain ox sat >92 % (continuous pulse ox monitoring) - Start PO prednisone 87m daily in AM (will likely need 2 week taper) - Duoneb treatment scheduled q6 - Albuterol q2 prn wheezing - Loratadine 169m- Pepcid 4050m Check 2 view CXR to ro PNA as cause - Check Respiratory virus panel. Droplet precautions - Nasal saline qid, Flonase - No indication for IV magnesium (severity) - Monitor PEF q6 with breathing treatments.    Diabetes mellitus type II, uncontrolled - Patient on Lantus 92 units at home. A1c uncontrolled at 8.8.  Patient received IV steroids - Diet Carb mod - Continue Lantus 92 units - SSI resistant scale.    GERD - Continue Pepcid    OSA on CPAP - Continue CPAP    Seasonal allergies - Flonase - Loratadine  DVT PPx: Lovenox Diet: Carb Mod Code Status: Full Dispo: Disposition is deferred at this time, awaiting improvement of current medical problems. Anticipated discharge in approximately 1 day(s).   The patient does have a current PCP (AlDuwaine MaxinO) and [facility].  The patient does not have transportation limitations that hinder transportation to clinic appointments.  Signed: EriJoni ReiningO 07/25/2013, 5:37 PM

## 2013-07-25 NOTE — Progress Notes (Signed)
[facility] called to come and check home cpap unit.

## 2013-07-26 ENCOUNTER — Encounter (HOSPITAL_COMMUNITY): Payer: Self-pay | Admitting: General Practice

## 2013-07-26 DIAGNOSIS — G4733 Obstructive sleep apnea (adult) (pediatric): Secondary | ICD-10-CM

## 2013-07-26 DIAGNOSIS — R0989 Other specified symptoms and signs involving the circulatory and respiratory systems: Secondary | ICD-10-CM

## 2013-07-26 DIAGNOSIS — K5900 Constipation, unspecified: Secondary | ICD-10-CM

## 2013-07-26 DIAGNOSIS — R05 Cough: Secondary | ICD-10-CM

## 2013-07-26 DIAGNOSIS — R079 Chest pain, unspecified: Secondary | ICD-10-CM

## 2013-07-26 DIAGNOSIS — E119 Type 2 diabetes mellitus without complications: Secondary | ICD-10-CM

## 2013-07-26 DIAGNOSIS — K219 Gastro-esophageal reflux disease without esophagitis: Secondary | ICD-10-CM

## 2013-07-26 DIAGNOSIS — M129 Arthropathy, unspecified: Secondary | ICD-10-CM

## 2013-07-26 DIAGNOSIS — R0609 Other forms of dyspnea: Secondary | ICD-10-CM

## 2013-07-26 DIAGNOSIS — R059 Cough, unspecified: Secondary | ICD-10-CM

## 2013-07-26 DIAGNOSIS — J45901 Unspecified asthma with (acute) exacerbation: Secondary | ICD-10-CM

## 2013-07-26 DIAGNOSIS — J309 Allergic rhinitis, unspecified: Secondary | ICD-10-CM

## 2013-07-26 DIAGNOSIS — IMO0001 Reserved for inherently not codable concepts without codable children: Secondary | ICD-10-CM

## 2013-07-26 DIAGNOSIS — E1165 Type 2 diabetes mellitus with hyperglycemia: Secondary | ICD-10-CM

## 2013-07-26 LAB — BASIC METABOLIC PANEL
BUN: 14 mg/dL (ref 6–23)
CHLORIDE: 95 meq/L — AB (ref 96–112)
CO2: 21 meq/L (ref 19–32)
Calcium: 9.6 mg/dL (ref 8.4–10.5)
Creatinine, Ser: 0.52 mg/dL (ref 0.50–1.10)
GFR calc non Af Amer: >90 mL/min (ref >90)
Glucose, Bld: 402 mg/dL — ABNORMAL HIGH (ref 70–99)
Potassium: 4.4 mEq/L (ref 3.7–5.3)
SODIUM: 135 meq/L — AB (ref 137–147)

## 2013-07-26 LAB — GLUCOSE, CAPILLARY
GLUCOSE-CAPILLARY: 364 mg/dL — AB (ref 70–99)
GLUCOSE-CAPILLARY: 384 mg/dL — AB (ref 70–99)
Glucose-Capillary: 341 mg/dL — ABNORMAL HIGH (ref 70–99)
Glucose-Capillary: 396 mg/dL — ABNORMAL HIGH (ref 70–99)

## 2013-07-26 MED ORDER — FLUTICASONE PROPIONATE 50 MCG/ACT NA SUSP
2.0000 | Freq: Every day | NASAL | Status: DC
  Administered 2013-07-27: 2 via NASAL
  Filled 2013-07-26: qty 16

## 2013-07-26 MED ORDER — IPRATROPIUM-ALBUTEROL 0.5-2.5 (3) MG/3ML IN SOLN
3.0000 mL | RESPIRATORY_TRACT | Status: DC
  Administered 2013-07-26 – 2013-07-27 (×5): 3 mL via RESPIRATORY_TRACT
  Filled 2013-07-26 (×5): qty 3

## 2013-07-26 MED ORDER — HYDROCOD POLST-CHLORPHEN POLST 10-8 MG/5ML PO LQCR: 5.0000 mL | Freq: Two times a day (BID) | ORAL | Status: DC | PRN

## 2013-07-26 NOTE — Progress Notes (Signed)
UR completed 

## 2013-07-26 NOTE — Assessment & Plan Note (Signed)
Patient had little improvement after albuterol followed by ipratropium nebs and 125 mg of IM Solu-Medrol. The patient's peak flows before treatments were in the 200s to 250s at best and after nebulizing treatments were only between the 270s and 300s. Patient had minimal air movement and only slight wheezes. Patient did not have any hypoxia or fever measured at this visit her at any point during or after nebulizing treatments. Patient has a profound cough and sick contacts which may have contributed to this acute asthma exacerbation. The decision was made for admission for management of acute asthma exacerbation.

## 2013-07-26 NOTE — Progress Notes (Signed)
Pt has home CPAP and will self administer when ready.  RT to monitor and assess as needed.

## 2013-07-26 NOTE — Progress Notes (Signed)
Subjective: Patient reports cough is much improved today. Nasal congestion also improved as well as breathing.  She does however note still getting SOB walking to bathroom and overall still not feeling back to her usual self. Objective: Vital signs in last 24 hours: Filed Vitals:   07/25/13 2115 07/26/13 0241 07/26/13 0533 07/26/13 0915  BP: 121/77  106/65   Pulse: 113  108   Temp: 98 F (36.7 C)  98 F (36.7 C)   TempSrc: Oral  Oral   Resp: 18  18   Height:      Weight:      SpO2: 98% 97% 93% 99%   Weight change:   Intake/Output Summary (Last 24 hours) at 07/26/13 1141 Last data filed at 07/26/13 1100  Gross per 24 hour  Intake    240 ml  Output      0 ml  Net    240 ml   General: resting in bed HEENT: clear oropharynx  Cardiac: RRR, no rubs, murmurs or gallops Pulm: clear to auscultation bilaterally, moving normal volumes of air, no wheezing Abd: soft, nontender, nondistended, BS present Ext: warm and well perfused, trace pedal edema  Lab Results: Basic Metabolic Panel:  Recent Labs Lab 07/25/13 1531 07/25/13 2030 07/26/13 0510  NA 137  --  135*  K 3.9  --  4.4  CL 99  --  95*  CO2 24  --  21  GLUCOSE 240*  --  402*  BUN 10  --  14  CREATININE 0.55 0.55 0.52  CALCIUM 9.3  --  9.6   CBC:  Recent Labs Lab 07/25/13 1531 07/25/13 2030  WBC 5.8 6.6  NEUTROABS 2.8  --   HGB 11.9* 11.8*  HCT 35.7* 35.5*  MCV 78.6 79.2  PLT 289 299   CBG:  Recent Labs Lab 07/25/13 1448 07/25/13 1735 07/25/13 2127 07/26/13 0809  GLUCAP 245* 186* 338* 341*   Hemoglobin A1C:  Recent Labs Lab 07/25/13 1500  HGBA1C 8.8   Urine Drug Screen: Drugs of Abuse     Component Value Date/Time   LABOPIA NEGATIVE 07/01/2009 2049   COCAINSCRNUR NEGATIVE 07/01/2009 2049   LABBENZ  Value: POSITIVE (NOTE) Result repeated and verified. Sent for confirmatory testing* 07/01/2009 2049   AMPHETMU NEGATIVE 07/01/2009 2049    Studies/Results: X-ray Chest Pa And Lateral    07/25/2013   CLINICAL DATA:  Asthma exacerbation with cough  EXAM: CHEST  2 VIEW  COMPARISON:  07/27/2012  FINDINGS: Borderline cardiomegaly, stable from prior. Normal upper mediastinal contour. No acute infiltrate or edema. No effusion or pneumothorax. No acute osseous findings.  IMPRESSION: No active cardiopulmonary disease.   Electronically Signed   By: Jorje Guild M.D.   On: 07/25/2013 22:24   Medications: I have reviewed the patient's current medications. Scheduled Meds: . docusate sodium  100 mg Oral BID  . enoxaparin (LOVENOX) injection  40 mg Subcutaneous Q24H  . famotidine  40 mg Oral Daily  . fluticasone  1 spray Each Nare Daily  . insulin aspart  0-20 Units Subcutaneous TID WC  . insulin aspart  0-5 Units Subcutaneous QHS  . insulin glargine  92 Units Subcutaneous QHS  . ipratropium-albuterol  3 mL Nebulization Q6H  . loratadine  10 mg Oral Daily  . predniSONE  40 mg Oral Q breakfast  . sodium chloride  1 spray Each Nare QID  . sodium chloride  3 mL Intravenous Q12H  . sodium chloride  3 mL Intravenous Q12H  Continuous Infusions:  PRN Meds:.sodium chloride, acetaminophen, acetaminophen, albuterol, guaiFENesin-dextromethorphan, ondansetron (ZOFRAN) IV, ondansetron, sodium chloride, traMADol Assessment/Plan:   Asthma with acute exacerbation - Patient clinically improving, reports PEF still in the ~300 range but her respiratory function is baseline likely poor due to patient's obesity. - Continue Prednisone 2m daily - Continue Scheduled Duoneb treatment. - Albuterol PRN - NEEDS outpatient PFTs in 4-6 weeks once over acute illness. - Apparently uses Albuterol inhaler 1-2 a month, may not even need Advair therapy. - Ambulate patient and check pulse ox, if able to maintain saturating and feeling better later today can likely go home.    Diabetes mellitus type II, uncontrolled  - Currently hyperglycemic secondary to steroids. - Continue Lantus 92 units. Monitor sugars  with resistant sliding scale and adjust as necessary     GERD -Pepcid    OSA on CPAP -CPAP    Seasonal allergies -Loratadine   DVT PPX: Lovenox Diet: Carb mod Code: Full Dispo: Disposition is deferred at this time, awaiting improvement of current medical problems.  Anticipated discharge in approximately 1 day(s).   The patient does have a current PCP (Duwaine Maxin DO) and [facility].  The patient does not have transportation limitations that hinder transportation to clinic appointments.  .Services Needed at time of discharge: Y = Yes, Blank = No PT:   OT:   RN:   Equipment:   Other:     LOS: 1 day   EJoni Reining DO 07/26/2013, 11:41 AM

## 2013-07-26 NOTE — H&P (Signed)
  Date: 07/26/2013  Patient name: Ana Long  Medical record number: [id number]  Date of birth: 1975-04-29   I have seen and evaluated Ana Long and discussed their care with the Residency Team. Ana Long was admitted for asthma exac, low peak flows, mild hypoxia, and unrelenting sxs at home despite tx. Her nl state is not limited by her breathing but back pain might limit her ability to ambulate. Normally, she can go to store and walk without stopping. 4 days ago, she had sudden onset of DOE, cough, dizziness, CP, ear pain, and congestion.   Her O2 sat on RA at rest is 99%. Her HR is 108. She is able to speak in full sentences. She has great air flow and no wheezing. She is morbidly obese with BMI of 58.5. Her CXR is nl. Bi carb is 21. WBC 6.6   Assessment and Plan: I have seen and evaluated the patient as outlined above. I agree with the formulated Assessment and Plan as detailed in the residents' admission note, with the following changes:   1. Acte Resp failure, mild, 2/2 acute asthma exac 2/2 likely viral URI - asthma exac seems unlikely since no wheezing and good air flow. However, there ise no more likely dx to explain her acute onset of dyspnea and other sxs. PE unlikely as she has other assoc sxs. OHS likely but doubt would cause acute onset of sxs. PNA not likely as CXR nl, no WBC, and no fever. So agree with PO steroids but would taper quickly. Monitor peak flows. Monitor O2 sat with ambulation. Likely D/C 1 -2 days. Will need outpt PFT's.   2. Asthma - on good months, only uses alb 1-2 times monthly. Doubt she needs inhaled steroid as meets criteria for intermittent asthma.  3. Morbid obesity with complications - has OSA, DM, GERD. Needs outpt mgmt.   Bartholomew Crews, MD 6/4/201511:28 AM

## 2013-07-26 NOTE — Progress Notes (Signed)
Inpatient Diabetes Program Recommendations  AACE/ADA: New Consensus Statement on Inpatient Glycemic Control (2013)  Target Ranges:  Prepandial:   less than 140 mg/dL      Peak postprandial:   less than 180 mg/dL (1-2 hours)      Critically ill patients:  140 - 180 mg/dL     Results for Ana Long, Ana Long (MRN [id number]) as of 07/26/2013 10:11  Ref. Range 07/25/2013 14:48 07/25/2013 17:35 07/25/2013 21:27  Glucose-Capillary Latest Range: 70-99 mg/dL 245 (H) 186 (H) 338 (H)    Results for Ana Long, Ana Long (MRN [id number]) as of 07/26/2013 10:11  Ref. Range 07/26/2013 08:09  Glucose-Capillary Latest Range: 70-99 mg/dL 341 (H)    Admitted with SOB.  Has History of DM2, OSA.  Home DM Meds: Lantus 92 units QHS + Glipizide 5 mg bid  Sees Physician with the Internal Medicine clinic as an outpatient.   **Note that home dose of Lantus started last night.  Patient also started on Novolog Resistant SSI tid ac + HS today at 8am.  **Patient did receive one dose IV Solumedrol yesterday afternoon around 3pm.  Currently getting Prednisone 40 mg daily.   Will follow while inpatient  Wyn Quaker RN, MSN, CDE Diabetes Coordinator Inpatient Diabetes Program Team Pager: [phone] (8a-10p)

## 2013-07-27 LAB — RESPIRATORY VIRUS PANEL
Adenovirus: NOT DETECTED
INFLUENZA A H1: NOT DETECTED
INFLUENZA A H3: NOT DETECTED
Influenza A: NOT DETECTED
Influenza B: NOT DETECTED
Metapneumovirus: NOT DETECTED
Parainfluenza 1: NOT DETECTED
Parainfluenza 2: NOT DETECTED
Parainfluenza 3: NOT DETECTED
RESPIRATORY SYNCYTIAL VIRUS A: NOT DETECTED
RHINOVIRUS: NOT DETECTED
Respiratory Syncytial Virus B: NOT DETECTED

## 2013-07-27 LAB — GLUCOSE, CAPILLARY
GLUCOSE-CAPILLARY: 209 mg/dL — AB (ref 70–99)
Glucose-Capillary: 362 mg/dL — ABNORMAL HIGH (ref 70–99)

## 2013-07-27 MED ORDER — BIOTENE DRY MOUTH MT LIQD
15.0000 mL | Freq: Two times a day (BID) | OROMUCOSAL | Status: DC
  Administered 2013-07-27: 15 mL via OROMUCOSAL

## 2013-07-27 MED ORDER — PREDNISONE 20 MG PO TABS: 20.0000 mg | ORAL_TABLET | Freq: Every day | ORAL | Status: AC

## 2013-07-27 MED ORDER — INSULIN ASPART 100 UNIT/ML ~~LOC~~ SOLN: 0.0000 [IU] | Freq: Three times a day (TID) | SUBCUTANEOUS | Status: DC

## 2013-07-27 MED ORDER — "INSULIN SYRINGE 31G X 5/16"" 1 ML MISC": 1.0000 [IU] | Freq: Three times a day (TID) | Status: DC

## 2013-07-27 MED ORDER — HYDROCOD POLST-CHLORPHEN POLST 10-8 MG/5ML PO LQCR: 5.0000 mL | Freq: Two times a day (BID) | ORAL | Status: DC | PRN

## 2013-07-27 MED ORDER — CALCIUM CARBONATE ANTACID 500 MG PO CHEW
1.0000 | CHEWABLE_TABLET | Freq: Three times a day (TID) | ORAL | Status: DC | PRN
  Administered 2013-07-27: 200 mg via ORAL
  Filled 2013-07-27: qty 1

## 2013-07-27 MED ORDER — IPRATROPIUM-ALBUTEROL 0.5-2.5 (3) MG/3ML IN SOLN: 3.0000 mL | RESPIRATORY_TRACT | Status: DC | PRN

## 2013-07-27 MED ORDER — TRIAMCINOLONE ACETONIDE 55 MCG/ACT NA AERO: 2.0000 | INHALATION_SPRAY | Freq: Every day | NASAL | Status: DC

## 2013-07-27 NOTE — Progress Notes (Addendum)
Inpatient Diabetes Program Recommendations  AACE/ADA: New Consensus Statement on Inpatient Glycemic Control (2013)  Target Ranges:  Prepandial:   less than 140 mg/dL      Peak postprandial:   less than 180 mg/dL (1-2 hours)      Critically ill patients:  140 - 180 mg/dL     Results for Ana Long, Ana Long (MRN [id number]) as of 07/27/2013 10:12  Ref. Range 07/26/2013 08:09 07/26/2013 12:11 07/26/2013 18:23 07/26/2013 21:24  Glucose-Capillary Latest Range: 70-99 mg/dL 341 (H) 364 (H) 384 (H) 396 (H)    Results for Ana Long, Ana Long (MRN [id number]) as of 07/27/2013 10:12  Ref. Range 07/27/2013 07:51  Glucose-Capillary Latest Range: 70-99 mg/dL 209 (H)     Patient now getting Prednisone 40 mg daily.  Having issues with both elevated fasting glucose levels and elevated postprandial levels.    Not eating very well per RN documentation.   MD- Please consider increasing Lantus insulin to 100 units QHS  If PO intake improves and patient continues to have elevated postprandial glucoses, please consider adding scheduled Novolog meal coverage   Will follow Ana Quaker RN, MSN, CDE Diabetes Coordinator Inpatient Diabetes Program Team Pager: [phone] (8a-10p)

## 2013-07-27 NOTE — Discharge Instructions (Signed)
Please take two 43m tablets of Prednisone for two days, then one 276mtablet of Prednisone for 4 days.    While you are taking Prednisone, please increase your daily Lantus dose to 100 units at bedtime.  After each meal, test your blood sugar level, and take Novolog according to the following scale:  CBG   70 - 120: 0 units  CBG 121 - 150: 3 units  CBG 151 - 200: 4 units  CBG 201 - 250: 7 units  CBG 251 - 300: 11 units  CBG 301 - 350: 15 units  CBG 351 - 400: 20 units Please discontinue your glipizide during this time.

## 2013-07-27 NOTE — Progress Notes (Signed)
07/27/13 Patient to go home today. IV site removed, Discharge instructions reviewed with patient.

## 2013-07-27 NOTE — Progress Notes (Signed)
I have seen the patient and reviewed the daily progress note by Arrie Aran MS 3 and discussed the care of the patient with them.  See below for documentation of my findings, assessment, and plans.  Subjective: Patient reports she is doing much better than when she presented. Does not feel back to normal especially when coughing.  She still notes getting SOB with ambulation however she was ambulated by nursing staff with pulse ox monitoring and maintained 96% oxygenation throughout. Objective: Vital signs in last 24 hours: Filed Vitals:   07/26/13 2105 07/27/13 0030 07/27/13 0512 07/27/13 1321  BP: 114/71  136/82 156/88  Pulse: 113  96 96  Temp: 98 F (36.7 C)  97.7 F (36.5 C) 97.9 F (36.6 C)  TempSrc: Oral  Oral Oral  Resp: 18  20 20   Height:      Weight:      SpO2: 94% 97% 96% 95%   Weight change:   Intake/Output Summary (Last 24 hours) at 07/27/13 1848 Last data filed at 07/26/13 2225  Gross per 24 hour  Intake    360 ml  Output      0 ml  Net    360 ml   General: resting in bed, obese HEENT: nasal hypertrophy improved, no oropharynx errythema Cardiac: RRR, no rubs, murmurs or gallops Pulm: clear to auscultation bilaterally no wheezing appreciated, no respiratory distress Abd: soft, nontender, nondistended, BS present Ext: warm and well perfused, no pedal edema Neuro: alert and oriented X3 Lab Results: Reviewed and documented in Electronic Record Micro Results: Reviewed and documented in Electronic Record Studies/Results: Reviewed and documented in Electronic Record Medications: I have reviewed the patient's current medications. Scheduled Meds: . antiseptic oral rinse  15 mL Mouth Rinse BID  . docusate sodium  100 mg Oral BID  . enoxaparin (LOVENOX) injection  40 mg Subcutaneous Q24H  . famotidine  40 mg Oral Daily  . fluticasone  2 spray Each Nare Daily  . insulin aspart  0-20 Units Subcutaneous TID WC  . insulin glargine  92 Units Subcutaneous QHS  .  loratadine  10 mg Oral Daily  . predniSONE  40 mg Oral Q breakfast  . sodium chloride  1 spray Each Nare QID  . sodium chloride  3 mL Intravenous Q12H  . sodium chloride  3 mL Intravenous Q12H   Continuous Infusions:  PRN Meds:.sodium chloride, acetaminophen, acetaminophen, calcium carbonate, chlorpheniramine-HYDROcodone, guaiFENesin-dextromethorphan, ipratropium-albuterol, ondansetron (ZOFRAN) IV, ondansetron, sodium chloride, traMADol Assessment/Plan: Asthma with acute exacerbation with OSA and likely OHS - Patient clinically improving, has maintained O2 sats >96% even while ambulating.   - Continue Prednisone 59m daily will discharge with short taper. - NEEDS outpatient PFTs in 4-6 weeks once over acute illness.  - Apparently uses Albuterol inhaler 1-2 a month, may not even need Advair therapy.  - Patient can be safely discharged home to continue treatment.  Diabetes mellitus type II, uncontrolled  - Currently hyperglycemic secondary to steroids. AM glucose improved. - Will increase Lantus to 100u/day while on steroids. Will Rx sliding scale on discharge and instruct to stop glipizide.  May need prandial glucose therpy adjustment at hospital follow up but resistant if she can continue to use glipizide.  Patient will follow up with diabetes educator at hospital follow up.  GERD  -Pepcid   OSA on CPAP  -CPAP   Seasonal allergies  -Loratadine   Allergic rhinitis - Not able to tolerate flonase, has also had rxn to patanase.  WIll RX Nasacort  to pharmacy on discharge. Prior auth filled out and faxed.  DVT PPX: Lovenox  Diet: Carb mod  Code: Full  Dispo: Disposition is deferred at this time, awaiting improvement of current medical problems. Anticipated discharge in approximately 1 day(s).  The patient does have a current PCP Duwaine Maxin, DO) and does need an [facility] hospital follow-up appointment after discharge.  The patient does not have transportation limitations that hinder  transportation to clinic appointments.   .Services Needed at time of discharge: Y = Yes, Blank = No PT:   OT:   RN:   Equipment:   Other:     LOS: 2 days   Joni Reining, DO 07/27/2013, 6:48 PM

## 2013-07-27 NOTE — Discharge Summary (Signed)
Name: Ana Long MRN: [id number] DOB: 04-08-1975 38 y.o. PCP: Duwaine Maxin, DO  Date of Admission: 07/25/2013  5:12 PM Date of Discharge: 07/27/2013 Attending Physician: Bartholomew Crews, MD  Discharge Diagnosis: Principal Problem:   Asthma with acute exacerbation Active Problems:   Diabetes mellitus type II, uncontrolled   Morbid obesity with BMI of 50 to 60   GERD   OSA on CPAP   Seasonal allergies   Asthma exacerbation  Discharge Medications:   Medication List    STOP taking these medications       Fluticasone-Salmeterol 100-50 MCG/DOSE Aepb  Commonly known as:  ADVAIR DISKUS      TAKE these medications       ACCU-CHEK FASTCLIX LANCETS Misc  1 each by Other route 2 (two) times daily.     acyclovir 400 MG tablet  Commonly known as:  ZOVIRAX  Take 1 tablet (400 mg total) by mouth 2 (two) times daily.     albuterol 108 (90 BASE) MCG/ACT inhaler  Commonly known as:  VENTOLIN HFA  Inhale 1-2 puffs into the lungs every 4 (four) hours as needed for wheezing or shortness of breath.     amitriptyline 10 MG tablet  Commonly known as:  ELAVIL  Take 10 mg by mouth daily as needed (nerve pain).     chlorpheniramine-HYDROcodone 10-8 MG/5ML Lqcr  Commonly known as:  TUSSIONEX  Take 5 mLs by mouth every 12 (twelve) hours as needed for cough.     famotidine 40 MG tablet  Commonly known as:  PEPCID  Take 40 mg by mouth daily.     fluconazole 150 MG tablet  Commonly known as:  DIFLUCAN  Take one tablet by mouth once  as needed for yeast infections     glucosamine-chondroitin 500-400 MG tablet  Take 2 tablets by mouth daily.     glucose blood test strip  Commonly known as:  ACCU-CHEK SMARTVIEW  1 each by Other route 2 (two) times daily.     insulin glargine 100 UNIT/ML injection  Commonly known as:  LANTUS  INJECT 92 UNITS INTO THE ABDOMEN AT BEDTIME     INSULIN SYRINGE 1CC/28G 28G X 1/2" 1 ML Misc  Use to administer insulin daily. Dx code: 8.02.  Insulin dependent.     loratadine 10 MG tablet  Commonly known as:  CLARITIN  Take 20 mg by mouth every evening.     naproxen sodium 220 MG tablet  Commonly known as:  ANAPROX  Take 220 mg by mouth at bedtime as needed (for pain).     predniSONE 20 MG tablet  Commonly known as:  DELTASONE  Take 1-2 tablets (20-40 mg total) by mouth daily with breakfast. Take two 67m tablets for two days, then one 263mtablet for 4 days.     PROBIOTIC DAILY PO  Take 2 capsules by mouth every evening.     senna 8.6 MG Tabs tablet  Commonly known as:  SENOKOT  Take 2 tablets by mouth at bedtime.     traMADol 50 MG tablet  Commonly known as:  ULTRAM  Take 1 tablet (50 mg total) by mouth every 6 (six) hours as needed.      ASK your doctor about these medications       glipiZIDE 5 MG tablet  Commonly known as:  GLUCOTROL  Take 5 mg by mouth 2 (two) times daily.     glipiZIDE 5 MG tablet  Commonly known as:  GLUCOTROL  TAKE  ONE TABLET BY MOUTH TWICE DAILY        Disposition and follow-up:   [name] was discharged from [facility] in Stable condition.  At the hospital follow up visit please address:  1.  Improvement of respiratory symptoms.  2. Blood glucose control.  3. Needs PFTs in 4-6 weeks  2.  Labs / imaging needed at time of follow-up: none  3.  Pending labs/ test needing follow-up: None  Follow-up Appointments:     Follow-up Information   Follow up with Clinton Gallant, MD On 08/09/2013. (at 2pm)    Specialty:  Internal Medicine   Contact information:   [place] [id number] [phone]       Discharge Instructions: Discharge Instructions   Call MD for:    Complete by:  As directed   worsening difficulty breathing, headache or visual disturbances     Diet - low sodium heart healthy    Complete by:  As directed      Discharge instructions    Complete by:  As directed   Take two tablets of Prednisone daily for two  days, then one tablet daily for 4 days.  Use Albuterol inhaler as needed for shortness of breath.  Increase your Lantus dose to 100U daily for the next 6 days (while taking Prednisone).     Increase activity slowly    Complete by:  As directed            Consultations:    Procedures Performed:  X-ray Chest Pa And Lateral   07/25/2013   CLINICAL DATA:  Asthma exacerbation with cough  EXAM: CHEST  2 VIEW  COMPARISON:  07/27/2012  FINDINGS: Borderline cardiomegaly, stable from prior. Normal upper mediastinal contour. No acute infiltrate or edema. No effusion or pneumothorax. No acute osseous findings.  IMPRESSION: No active cardiopulmonary disease.   Electronically Signed   By: Jorje Guild M.D.   On: 07/25/2013 22:24   Admission HPI: Ana Long is a 38 yo AA female with PMH of Asthma, OSA, Obesity, GERD, Allergic rhinitis. She presented to the [facility] today for an acute visit with CC of cough. She reports that she has had a 4 day history of productive cough with clear sputum. She notes minimal improvement with tussin DM, nyquill, claritin, patanase, or flonase. She reports some associated dizziness when she has episdoes of excessive coughing. She also has some right ear pain, headache, chest pain with deep inspiration, nasal congestion. She has run out of her albuterol inhaler but has been using her daughter's inhaler 2-4 times a day without much relief. She reports she had not been using Advair until the last 4 days. She notes visiting a day care prior to onset of symptoms where there were sick children. In the clinic she was noted to have decreased air movement without frank wheezing. A peak flow was measured at 250 (age expected 1) prior to treatment. Post treatment her peak flow was measured at 300. She was directly admitted from the clinic for asthma exacerbation.    Hospital Course by problem list: Asthma with acute exacerbation with OSA and likely OHS  Patient presented with ARF  thought likely due to Asthma Exacerbation.  Her PEF was ~60% of expected on admission.  She was initially treated with IV Solumedrol and DuoNeb treatments.  This was changed the following day to oral prednisone.  A CXR showed no clear infiltrate and a respiratory virus panel was negative.  She was able to maintain her oxygen saturation  >96% even while ambulating. And was discharged on Prednisone 50m daily with short taper. She was recommended to continue albuterol PRN.  From her description she has intermittent asthma and [place] She does  need outpatient PFTs in 4-6 weeks once over acute illness.   Diabetes mellitus type II, uncontrolled  - Patient with poorly controlled DM.  She was started on steroid for her asthma exacerbation.  We increased her Lantus dose to 100u/day (from 92u), held her Glipizide and started her on a SSI (Resistant) scale.  She was additionally given information about a low carb diet.  She expressed interest to meet with our diabetes coordinator as outpatient.  She will continue the SSI on discharge but reports if possible she would like to go back to taking glipizide after her steroid course is complete.  OSA on CPAP  -CPAP   Seasonal allergies with Allergic rhinitis  - She was continued with Loratadine.  Not able to tolerate flonase, has also had rxn to patanase. RX Nasacort to pharmacy on discharge. Prior auth filled out and faxed.   Discharge Vitals:   BP 156/88  Pulse 96  Temp(Src) 97.9 F (36.6 C) (Oral)  Resp 20  Ht 5' 1.02" (1.55 m)  Wt 309 lb 4.9 oz (140.3 kg)  BMI 58.40 kg/m2  SpO2 95%  Discharge Labs:  Results for orders placed during the hospital encounter of 07/25/13 (from the past 24 hour(s))  GLUCOSE, CAPILLARY     Status: Abnormal   Collection Time    07/26/13  6:23 PM      Result Value Ref Range   Glucose-Capillary 384 (*) 70 - 99 mg/dL  GLUCOSE, CAPILLARY     Status: Abnormal   Collection Time    07/26/13  9:24 PM        Result Value Ref Range   Glucose-Capillary 396 (*) 70 - 99 mg/dL  GLUCOSE, CAPILLARY     Status: Abnormal   Collection Time    07/27/13  7:51 AM      Result Value Ref Range   Glucose-Capillary 209 (*) 70 - 99 mg/dL  GLUCOSE, CAPILLARY     Status: Abnormal   Collection Time    07/27/13 11:56 AM      Result Value Ref Range   Glucose-Capillary 362 (*) 70 - 99 mg/dL    Signed: EJoni Reining DO 07/27/2013, 2:27 PM   Time Spent on Discharge: 40 minutes Services Ordered on Discharge: none Equipment Ordered on Discharge: none

## 2013-07-27 NOTE — Progress Notes (Signed)
Subjective: Ana Long has increasing cough, with associated SOB, and DOE.  She states her symptoms have improved since admission, though she did require increased frequency of Duonebs overnight (q6 to q4).  She expressed mixed feelings about discharge home, stating that she missed her 6 children, but knew they would demand more of her than she was able to do with her SOB.  Currently, she is unable to ambulate to her restroom without stopping to catch her breath.    Objective: Vital signs in last 24 hours: Temp:  [97.7 F (36.5 C)-98.2 F (36.8 C)] 97.7 F (36.5 C) (06/05 0512) Pulse Rate:  [96-113] 96 (06/05 0512) Resp:  [18-20] 20 (06/05 0512) BP: (114-136)/(71-82) 136/82 mmHg (06/05 0512) SpO2:  [94 %-99 %] 96 % (06/05 0512)  Intake/Output from previous day: 06/04 0701 - 06/05 0700 In: 600 [P.O.:600] Out: -  Intake/Output this shift:   General Appearance:    Alert, cooperative, no distress, appears stated age  Head:    Normocephalic, without obvious abnormality, atraumatic  Eyes:    conjunctiva/corneas clear, EOM's grossly intact  Nose: Nares normal, septum midline, mucosa normal, no drainage    or sinus tenderness  Neck: Supple, symmetrical, trachea midline  Lungs: Mild expiratory wheezes appreciated at both lung bases, respirations labored after conversing at some length   Heart:  Regular rate and rhythm, S1 and S2 normal, no murmur, rub   or gallop   Extremities:   Extremities normal, atraumatic, no cyanosis, mild edema  Pulses:   2+ and symmetric all extremities  Skin:   Skin color, texture, turgor normal, no rashes or lesions.  Depigmentation present on R forearm.       Results for orders placed during the hospital encounter of 07/25/13 (from the past 24 hour(s))  GLUCOSE, CAPILLARY     Status: Abnormal   Collection Time    07/26/13  6:23 PM      Result Value Ref Range   Glucose-Capillary 384 (*) 70 - 99 mg/dL  GLUCOSE, CAPILLARY     Status: Abnormal   Collection Time    07/26/13  9:24 PM      Result Value Ref Range   Glucose-Capillary 396 (*) 70 - 99 mg/dL  GLUCOSE, CAPILLARY     Status: Abnormal   Collection Time    07/27/13  7:51 AM      Result Value Ref Range   Glucose-Capillary 209 (*) 70 - 99 mg/dL  GLUCOSE, CAPILLARY     Status: Abnormal   Collection Time    07/27/13 11:56 AM      Result Value Ref Range   Glucose-Capillary 362 (*) 70 - 99 mg/dL    Studies/Results: X-ray Chest Pa And Lateral   07/25/2013   CLINICAL DATA:  Asthma exacerbation with cough  EXAM: CHEST  2 VIEW  COMPARISON:  07/27/2012  FINDINGS: Borderline cardiomegaly, stable from prior. Normal upper mediastinal contour. No acute infiltrate or edema. No effusion or pneumothorax. No acute osseous findings.  IMPRESSION: No active cardiopulmonary disease.   Electronically Signed   By: Jorje Guild M.D.   On: 07/25/2013 22:24    Scheduled Meds: . antiseptic oral rinse  15 mL Mouth Rinse BID  . docusate sodium  100 mg Oral BID  . enoxaparin (LOVENOX) injection  40 mg Subcutaneous Q24H  . famotidine  40 mg Oral Daily  . fluticasone  2 spray Each Nare Daily  . insulin aspart  0-20 Units Subcutaneous TID WC  . insulin glargine  92  Units Subcutaneous QHS  . loratadine  10 mg Oral Daily  . predniSONE  40 mg Oral Q breakfast  . sodium chloride  1 spray Each Nare QID  . sodium chloride  3 mL Intravenous Q12H  . sodium chloride  3 mL Intravenous Q12H   Continuous Infusions:  PRN Meds:sodium chloride, acetaminophen, acetaminophen, calcium carbonate, chlorpheniramine-HYDROcodone, guaiFENesin-dextromethorphan, ipratropium-albuterol, ondansetron (ZOFRAN) IV, ondansetron, sodium chloride, traMADol  Assessment/Plan: Asthma with acute exacerbation  - Patient clinically improving, reported PEF yesterday still in the ~300 range but her respiratory function is baseline likely poor due to patient's obesity. Does not have her meter available today. - Continue Prednisone 71m  daily, taper on OTPT basis  - Continue Scheduled Duoneb treatment until DC - Albuterol PRN  - NEEDS outpatient PFTs in 4-6 weeks once over acute illness, will schedule - Apparently uses Albuterol inhaler 1-2 a month, will not prescribe Advair as she has had minimal rescue needs despite Advair noncompliance at home. - Ambulate patient and check pulse ox, if able to maintain saturation and feeling better later today can likely go home.   Diabetes mellitus type II, uncontrolled  - Currently hyperglycemic secondary to steroids.  - Will increase Lantus to 100 units while on steroid regimen. Monitor sugars with resistant sliding scale and adjust as necessary, continue home glipizide 5U BID upon discharge.  GERD  -Pepcid   OSA on CPAP  -CPAP   Seasonal allergies  -Loratadine   DVT PPX: Lovenox   Diet: Carb mod   Code: Full   Dispo: Disposition is deferred at this time, awaiting improvement of current medical problems. Anticipated discharge in approximately 1 day(s).     LOS: 2 days   MBarbie Haggis

## 2013-07-27 NOTE — Care Management Note (Signed)
    Page 1 of 1   07/27/2013     2:41:27 PM CARE MANAGEMENT NOTE 07/27/2013  Patient:  Ana Long,Ana Long   Account Number:  [id number]  Date Initiated:  07/27/2013  Documentation initiated by:  Tomi Bamberger  Subjective/Objective Assessment:   dx asthem ex  admit- from home.     Action/Plan:   Anticipated DC Date:  07/27/2013   Anticipated DC Plan:  [place]  CM consult      Choice offered to / List presented to:             Status of service:  Completed, signed off Medicare Important Message given?   (If response is "NO", the following Medicare IM given date fields will be blank) Date Medicare IM given:   Date Additional Medicare IM given:    Discharge Disposition:  HOME/SELF CARE  Per UR Regulation:  Reviewed for med. necessity/level of care/duration of stay  If discussed at [place] of Stay Meetings, dates discussed:    Comments:

## 2013-07-27 NOTE — Progress Notes (Signed)
Visit to patient while in hospital per her request for information on a low carb diet. Provided her with a meal plan for 165-200 grams carb/day and a book to help her identify carbs. She requests appointment with CDE same day as hospital follow up. Will arrange visit and call after patient is discharged to assist with transition of care.

## 2013-07-30 ENCOUNTER — Ambulatory Visit: Payer: Medicaid Other | Admitting: Family Medicine

## 2013-07-31 ENCOUNTER — Telehealth: Payer: Self-pay | Admitting: Dietician

## 2013-07-31 NOTE — Telephone Encounter (Signed)
Calling to assist with transition of care from hospital to home. Discharge date:07-27-13 Call date: 07-31-13 Hospital follow up appointment date: 08-09-13 with Dr. Algis Liming At 2 Pm  Discharge medications reviewed: no Able to fill all prescriptions? Waiting for Flonase, waiting on approval from [facility], [facility] assisting with this  Patient aware of hospital follow up appointments.  yes She reports no problems with transportation. Other problems/concerns:doing okay with diet and insulin , all under the 300s, has only take Novolog a few times

## 2013-08-06 NOTE — Progress Notes (Signed)
Case discussed with Dr. Sadek at the time of the visit.  We reviewed the resident's history and exam and pertinent patient test results.  I agree with the assessment, diagnosis, and plan of care documented in the resident's note. 

## 2013-08-09 ENCOUNTER — Ambulatory Visit (INDEPENDENT_AMBULATORY_CARE_PROVIDER_SITE_OTHER): Payer: Medicaid Other | Admitting: Dietician

## 2013-08-09 ENCOUNTER — Ambulatory Visit (INDEPENDENT_AMBULATORY_CARE_PROVIDER_SITE_OTHER): Payer: Medicaid Other | Admitting: Internal Medicine

## 2013-08-09 VITALS — BP 109/66 | HR 100 | Temp 98.0°F | Ht 61.0 in | Wt 300.1 lb

## 2013-08-09 DIAGNOSIS — H65191 Other acute nonsuppurative otitis media, right ear: Secondary | ICD-10-CM

## 2013-08-09 DIAGNOSIS — E1165 Type 2 diabetes mellitus with hyperglycemia: Secondary | ICD-10-CM

## 2013-08-09 DIAGNOSIS — J45909 Unspecified asthma, uncomplicated: Secondary | ICD-10-CM

## 2013-08-09 DIAGNOSIS — IMO0001 Reserved for inherently not codable concepts without codable children: Secondary | ICD-10-CM

## 2013-08-09 DIAGNOSIS — H6691 Otitis media, unspecified, right ear: Secondary | ICD-10-CM | POA: Insufficient documentation

## 2013-08-09 DIAGNOSIS — IMO0002 Reserved for concepts with insufficient information to code with codable children: Secondary | ICD-10-CM

## 2013-08-09 DIAGNOSIS — H1089 Other conjunctivitis: Secondary | ICD-10-CM

## 2013-08-09 DIAGNOSIS — H6690 Otitis media, unspecified, unspecified ear: Secondary | ICD-10-CM

## 2013-08-09 LAB — GLUCOSE, CAPILLARY: GLUCOSE-CAPILLARY: 344 mg/dL — AB (ref 70–99)

## 2013-08-09 MED ORDER — AZITHROMYCIN 500 MG PO TABS: 500.0000 mg | ORAL_TABLET | Freq: Every day | ORAL | Status: AC

## 2013-08-09 MED ORDER — HYDROCORTISONE-ACETIC ACID 1-2 % OT SOLN: 3.0000 [drp] | Freq: Two times a day (BID) | OTIC | Status: DC

## 2013-08-09 MED ORDER — LORATADINE 10 MG PO TABS: 10.0000 mg | ORAL_TABLET | Freq: Two times a day (BID) | ORAL | Status: DC

## 2013-08-09 NOTE — Progress Notes (Signed)
Subjective:    Patient ID: Ana Long, female    DOB: 1975/06/06, 38 y.o.   MRN: [id number]  HPI Ana Long is a 38 yo woman pmh as listed below presents for HFU.   Pt was admitted on 07/27/13 for an asthma exacerbation. The patient was just treated with IV Solu-Medrol duo nebs that was then changed to oral prednisone and no antibiotics she does not have any clear etiology except to being without her medications. Since that time the patient has completed her steroid taper and is feeling much better.  Her diabetes is very poorly controlled and she had an increase in her Lantus to 100 units per day and sliding scale insulin she then also resumed some glipizide after her steroid taper was complete. Since that time the patient brings in her meter with readings 150s on average, 328 highest, 100 lowest. She has not had any hypoglycemic symptoms or episodes. The patient stated that after she discontinued her steroid taper she decreased her Lantus back to the 92 units and was intermittently continuing the sliding scale insulin and had not restarted her glipizide.  In terms of her other complaints she has some ongoing ear pain or some the right than the left but with no associated hearing loss, tinnitus, loss of balance, vertigo or dizziness. The patient has a had some subjective chills but no frank fevers at home. She also has had some ongoing drainage from her right eye that started this a.m. 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 She also denied any chest pain, shortness of breath, dyspnea on exertion, headaches, TMJ pain or dental pain.  Past Medical History  Diagnosis Date  . GERD (gastroesophageal reflux disease)   . Diabetes mellitus     intolerant to Metformin  . Herpes genital   . PCOS (polycystic ovarian syndrome)   . Morbid obesity with BMI of 50 to 60 07/23/2009  . Dry skin     hands   . Asthma     prn inhaler  . Arthritis     major joints  . Complication  of anesthesia     states is hard to wake up post-op  . Family history of anesthesia complication     mother went into coma during c-section  . Sleep apnea, obstructive     uses CPAP nightly - had sleep study 08/22/2007  . Carpal tunnel syndrome of right wrist 09/2012  . Obesity hypoventilation syndrome 10/30/2012  . OSA on CPAP      AHI was on  11-17-12 to 120/hr, titrated to 15 cm with 3 cm EPR/   . Seasonal allergies    Current Outpatient Prescriptions on File Prior to Visit  Medication Sig Dispense Refill  . ACCU-CHEK FASTCLIX LANCETS MISC 1 each by Other route 2 (two) times daily.  102 each  5  . acyclovir (ZOVIRAX) 400 MG tablet Take 1 tablet (400 mg total) by mouth 2 (two) times daily.  60 tablet  3  . albuterol (VENTOLIN HFA) 108 (90 BASE) MCG/ACT inhaler Inhale 1-2 puffs into the lungs every 4 (four) hours as needed for wheezing or shortness of breath.  1 Inhaler  5  . amitriptyline (ELAVIL) 10 MG tablet Take 10 mg by mouth daily as needed (nerve pain).      . chlorpheniramine-HYDROcodone (TUSSIONEX) 10-8 MG/5ML LQCR Take 5 mLs by mouth every 12 (twelve) hours as needed for cough.  115 mL  0  . famotidine (PEPCID) 40 MG tablet Take  40 mg by mouth daily.      . fluconazole (DIFLUCAN) 150 MG tablet Take one tablet by mouth once  as needed for yeast infections  2 tablet  5  . glucosamine-chondroitin 500-400 MG tablet Take 2 tablets by mouth daily.       Marland Kitchen glucose blood (ACCU-CHEK SMARTVIEW) test strip 1 each by Other route 2 (two) times daily.  100 each  5  . insulin aspart (NOVOLOG) 100 UNIT/ML injection Inject 0-20 Units into the skin 3 (three) times daily with meals.  10 mL  1  . insulin glargine (LANTUS) 100 UNIT/ML injection INJECT 92 UNITS INTO THE ABDOMEN AT BEDTIME  3 vial  3  . Insulin Syringe-Needle U-100 (INSULIN SYRINGE 1CC/28G) 28G X 1/2" 1 ML MISC Use to administer insulin daily. Dx code: 46.02. Insulin dependent.  100 each  3  . Insulin Syringe-Needle U-100 (INSULIN SYRINGE  1CC/31GX5/16") 31G X 5/16" 1 ML MISC 1 Units by Does not apply route 3 (three) times daily before meals.  100 each  11  . naproxen sodium (ANAPROX) 220 MG tablet Take 220 mg by mouth at bedtime as needed (for pain).       . Probiotic Product (PROBIOTIC DAILY PO) Take 2 capsules by mouth every evening.       . senna (SENOKOT) 8.6 MG TABS tablet Take 2 tablets by mouth at bedtime.      . traMADol (ULTRAM) 50 MG tablet Take 1 tablet (50 mg total) by mouth every 6 (six) hours as needed.  45 tablet  0  . triamcinolone (NASACORT AQ) 55 MCG/ACT AERO nasal inhaler Place 2 sprays into the nose daily.  1 Inhaler  12   No current facility-administered medications on file prior to visit.   Social, surgical, family history reviewed with patient and updated in appropriate chart locations.   Review of Systems  Negative except Listed in history of present illness     Objective:   Physical Exam Filed Vitals:   08/09/13 1413  BP: 109/66  Pulse: 100  Temp: 98 F (36.7 C)   General: sitting in chair, morbidly obese HEENT: PERRL, EOMI, no scleral icterus, right ear TM with marked erythema and tenderness with insertion of otoscope, some tinnea pain on right side, slight bulging of TM on right no pus or perforation noted , left TM pearly grey no erythema Cardiac: RRR, no rubs, murmurs or gallops Pulm: clear to auscultation bilaterally, no cracles or rhonchi, only intermittent wheezes may represent upper airway noise in upper lung fields, moving normal volumes of air Abd: soft, nontender, nondistended, BS present Ext: warm and well perfused, no pedal edema Neuro: alert and oriented X3, cranial nerves II-XII grossly intact    Assessment & Plan:  Please see problem oriented charting  Pt discussed with Dr. Beryle Beams

## 2013-08-09 NOTE — Progress Notes (Signed)
Attending physician note: Presenting problem, physical findings, medications, medical history, reviewed with resident physician Dr.Nora Algis Liming   and I concur with her management. Murriel Hopper, M.D., [place]

## 2013-08-09 NOTE — Progress Notes (Signed)
Medical Nutrition Therapy:  Appt start time: 7591 end time:  6384 Last visit: 11/2012 Assessment:  Primary concerns today: Weight management and Blood sugar control Patient is accompanied by 2 sons today during visit one of which is also trying to lose weight and very supportive. She lost 9# since making dietary changes and very optimistic to continue changes.  Incorporating more fruits and vegetables in her diet, eating 3 meals/day, 3 snacks/day and watching carb intake.  Patient has increased water intake and decreased regular soda drinks, however since finishing prednisone she has started drinking more soda and eating meals more sporadically. She is trying to eat more during the day and less after 9pm.  She has difficulty breathing in hot weather so exercise is very limited.  Continues to work towards lifestyle changes and family is supportive of changes.    Medications: restarting glipizide. lantus 92units and correction insulin Blood sugars: average for past month was 218, checking blood sugars 1.5 x day, trend shows blood sugars are increasing throughout the day indicating inadequate prandial coverage.   Progress Towards Goal(s):  In progress.   Nutritional Diagnosis:  NB-1.1 Food and nutrition-related knowledge deficit As related to lack of prior exposure to meal planning for weight loss/ diabetes.  As evidenced by her questions and portion control.    Intervention:  Nutrition education about healthy snacks and meal planning Encouraged to continue to eat 3 meals earlier in day by setting alarm(s) in smartphone  Talked about healthy snacks to have before bed.  Monitoring/Evaluation:  Dietary intake, exercise, blood sugars, and body weight in 4 week(s).

## 2013-08-09 NOTE — Assessment & Plan Note (Signed)
Lab Results  Component Value Date   HGBA1C 8.8 07/25/2013   HGBA1C 9.7 03/08/2013   HGBA1C 7.8 11/22/2012    Wt Readings from Last 3 Encounters:  08/09/13 300 lb 1.6 oz (136.124 kg)  07/25/13 309 lb 4.9 oz (140.3 kg)  07/25/13 309 lb 3.2 oz (140.252 kg)    Assessment: Diabetes control:  improving Progress toward A1C goal:    Comments: patient recently on a steroid taper for asthma exacerbation which is now discontinued and the patient decided to decrease her Lantus back to 92 units of the prescribed 100 and was continuing on a resistant sliding scale insulin with NovoLog but had not restarted her glipizide  Plan: Medications:  Will continue Lantus 92 units each bedtime, return back glipizide, continue sliding scale insulin with NovoLog Home glucose monitoring: Frequency:   Timing:   Instruction/counseling given: reminded to get eye exam, reminded to bring blood glucose meter & log to each visit, reminded to bring medications to each visit, discussed the need for weight loss and discussed diet Educational resources provided: brochure Self management tools provided:   Other plans: the patient also saw the diabetes educator after this visit

## 2013-08-09 NOTE — Assessment & Plan Note (Signed)
Continues to have itchy watery eyes with some drainage now. No eye pain or blurry vision noted, no conjunctival injection to suggest bacterial or viral etiology.   -recommended symptomatic management with warm compresses -Followup with ophthalmology if symptoms worsen as the patient does have blepharitis at baseline

## 2013-08-09 NOTE — Patient Instructions (Signed)
Ideas to help continue your great weight loss:  Eat most of calories before 9 Pm at night. ? Alarms to help you eat breakfast by a certain time.  Foods to eat after 9 PM if needed:   Any kind of vegetables or a large salad at night with light ranch dressing is great!!!   Diet geletan with cool whip   Yogurt with Fiber One Cereal as a topping

## 2013-08-09 NOTE — Patient Instructions (Addendum)
High schedule (Insulin-resistant) BG 150-199: 2 unit bolus Insulin (regular or rapid-acting) BG 200-249: 4 units bolus Insulin BG 250-299: 7 units bolus Insulin BG 300-349: 10 units bolus Insulin BG Over 350: 12 units bolus Insulin  For your ear: give you acetasol for drops to help and antibiotic Azithromycin for 3 days For your eye: warm compress in the morning

## 2013-08-09 NOTE — Assessment & Plan Note (Signed)
The patient does have some tinea tenderness and visible erythema around the tympanic membrane and no frank perforation or exudate was seen. Given that the pain has been going on for quite some time will empirically treat for otitis media.patient does not have any red flag symptoms such as tinnitus acute hearing loss or visual changes. Since this is only primary event would not necessarily consider imaging. -Symptomatic relief with acetasol drops -Since the patient has a penicillin allergy Will do azithromycin 500 mg daily for 3 days -Patient followup if symptoms do not improve

## 2013-08-09 NOTE — Assessment & Plan Note (Signed)
Patient was recently discharged on 07/27/13 for acute exacerbation in the setting of not having her medications. She was discharged on a steroid taper with which she has completed and had full resolution of her symptoms. It was recommended that time for the patient to have followup PFTs done. -Outpatient PFTs -Continue albuterol inhaler

## 2013-09-11 ENCOUNTER — Telehealth: Payer: Self-pay | Admitting: *Deleted

## 2013-09-11 ENCOUNTER — Ambulatory Visit (INDEPENDENT_AMBULATORY_CARE_PROVIDER_SITE_OTHER): Payer: Medicaid Other | Admitting: Dietician

## 2013-09-11 ENCOUNTER — Encounter: Payer: Self-pay | Admitting: Internal Medicine

## 2013-09-11 ENCOUNTER — Ambulatory Visit (INDEPENDENT_AMBULATORY_CARE_PROVIDER_SITE_OTHER): Payer: Medicaid Other | Admitting: Internal Medicine

## 2013-09-11 VITALS — BP 142/90 | HR 101 | Temp 98.6°F | Wt 304.6 lb

## 2013-09-11 VITALS — BP 142/90 | HR 99 | Temp 98.6°F | Wt 304.6 lb

## 2013-09-11 DIAGNOSIS — Z0000 Encounter for general adult medical examination without abnormal findings: Secondary | ICD-10-CM

## 2013-09-11 DIAGNOSIS — Z9989 Dependence on other enabling machines and devices: Secondary | ICD-10-CM

## 2013-09-11 DIAGNOSIS — G4733 Obstructive sleep apnea (adult) (pediatric): Secondary | ICD-10-CM

## 2013-09-11 DIAGNOSIS — M546 Pain in thoracic spine: Secondary | ICD-10-CM

## 2013-09-11 DIAGNOSIS — G629 Polyneuropathy, unspecified: Secondary | ICD-10-CM

## 2013-09-11 DIAGNOSIS — IMO0001 Reserved for inherently not codable concepts without codable children: Secondary | ICD-10-CM

## 2013-09-11 DIAGNOSIS — IMO0002 Reserved for concepts with insufficient information to code with codable children: Secondary | ICD-10-CM

## 2013-09-11 DIAGNOSIS — E1165 Type 2 diabetes mellitus with hyperglycemia: Secondary | ICD-10-CM

## 2013-09-11 DIAGNOSIS — G609 Hereditary and idiopathic neuropathy, unspecified: Secondary | ICD-10-CM

## 2013-09-11 DIAGNOSIS — J45901 Unspecified asthma with (acute) exacerbation: Secondary | ICD-10-CM

## 2013-09-11 MED ORDER — GABAPENTIN 100 MG PO CAPS: 100.0000 mg | ORAL_CAPSULE | Freq: Three times a day (TID) | ORAL | Status: DC

## 2013-09-11 MED ORDER — INSULIN PEN NEEDLE 31G X 5 MM MISC: Status: DC

## 2013-09-11 MED ORDER — GABAPENTIN 300 MG PO CAPS: 300.0000 mg | ORAL_CAPSULE | Freq: Three times a day (TID) | ORAL | Status: DC

## 2013-09-11 MED ORDER — EXENATIDE 5 MCG/0.02ML ~~LOC~~ SOPN: 5.0000 ug | PEN_INJECTOR | Freq: Two times a day (BID) | SUBCUTANEOUS | Status: DC

## 2013-09-11 MED ORDER — INSULIN GLARGINE 100 UNIT/ML SOLOSTAR PEN: 46.0000 [IU] | PEN_INJECTOR | Freq: Two times a day (BID) | SUBCUTANEOUS | Status: DC

## 2013-09-11 NOTE — Patient Instructions (Addendum)
1. Please CONTINUE taking Lantus but take 46 units in the morning and 46 units in the evening.  STOP taking Novolog and Glipizide.  START taking Byetta injections two times per day before meals.  Please continue checking blood sugar three times per day and come back to see me in 1 month.  If you have new symptoms after starting Byetta or if blood sugars are consistently low please call the clinic.  2. STOP amitryptiline and START taking gabapentin three times daily for neuropathy in hands and feet.  3. Your headaches and sleep problem seem to be due to not breathing well at night because your CPAP mask is coming off.  Call [place] and see when you can come in to have CPAP mask changed/refit.  4. Your back pain is likely musculoskeletal.  Try ibuprofen or naproxen if needed.  Also try ice or massage to the area.    5. I would suggest talking with your family about specific types of cancer that family member have had.  There seems to be a lot of people affected by ovarian cancer.  Are there any family with breast cancer?  Colon cancer?  I will send you for screening mammogram when you turn 38 in December.  6. Please take all medications as prescribed.   7. If you have worsening of your symptoms or new symptoms arise, please call the clinic ([phone]), or go to the ER immediately if symptoms are severe.

## 2013-09-11 NOTE — Progress Notes (Signed)
   Subjective:    Patient ID: Ana Long, female    DOB: 28-May-1975, 38 y.o.   MRN: [id number]  HPI Comments: [name] is a 38 year old with a PMH of DM type 2 (HgbA1c 8.30 July 2013), asthma, seasonal allergy, OSA on CPAP, GERD and osteoarthritis who presents for routine follow-up.  DM - she is taking 92 units at night everynight, glipizide 16m BID, Novolog with breakfast and lunch (usually 2 and 5 units), she is checking CBGs and has brought meter for download.  Avg CBG is 215, highest 315 and lowest 116.  She feels hypoglycemic below 120, has happened about 3 times in the past month.  Eating breakfast 4/7 days of the week. Trying to increase water.  Cutting back on eating out.  If she goes out she just gets the burger, does not eat the fries or get soda.  Has c/o of numbness/burning/itching in hands and feet x several months.  Amitriptyline helps but it makes her sleepy.   OSA - wearing CPAP nightly; has not been sleeping well lately; she has been waking up with CPAP off about 3-4 times per week.  Increased daytime sleepiness and intermittent headache x 2 weeks.  She has had current CPAP for about 7 months.  Waking up with nasal congestion, coughing.  She feels okay, breathing fine when she goes to bed.    Asthma - breathing at baseline, has not had to increase rescue inhaler use.  Health maintenance - 2 first cousins have ovarian Ca who are in the 348sand 476s  A maternal aunt with ovarian cancer who died years later from another cause.  No breast cancer hx.  Periods are normal.        Review of Systems  Constitutional: Negative for chills.  HENT: Positive for congestion and facial swelling. Negative for sneezing.   Respiratory: Negative for shortness of breath and wheezing.   Cardiovascular: Positive for leg swelling. Negative for palpitations.  Gastrointestinal: Positive for constipation. Negative for diarrhea and blood in stool.  Genitourinary: Positive for  frequency. Negative for dysuria and hematuria.  Neurological: Negative for syncope.       Objective:   Physical Exam  Vitals reviewed. Constitutional: She is oriented to person, place, and time. No distress.  HENT:  Head: Normocephalic and atraumatic.  Right Ear: External ear normal.  Left Ear: External ear normal.  Mouth/Throat: Oropharynx is clear and moist. No oropharyngeal exudate.  TMs intact, no exudate or effusion  Eyes: EOM are normal. Pupils are equal, round, and reactive to light.  Cardiovascular: Normal rate, regular rhythm and normal heart sounds.  Exam reveals no gallop and no friction rub.   No murmur heard. Intact dorsalis pedis B/L  Pulmonary/Chest: Effort normal and breath sounds normal. No respiratory distress. She has no wheezes. She has no rales.  Abdominal: Soft. Bowel sounds are normal. She exhibits no distension. There is no tenderness.  Musculoskeletal: She exhibits tenderness. She exhibits no edema.  Lower thoracic TTP  Neurological: She is alert and oriented to person, place, and time. No cranial nerve deficit.  Intact achilles reflex, toe proprioception and foot sensation  Skin: Skin is warm. She is not diaphoretic.  Psychiatric: She has a normal mood and affect. Her behavior is normal.          Assessment & Plan:  Please see problem based assessment and plan.

## 2013-09-11 NOTE — Telephone Encounter (Addendum)
Submitted request online-request sent for review.  Medication is preferred after trial and failure of metformin, which is currently listed on pt's chart as an allergy.  Request sent for review.Despina Hidden Cassady7/21/20154:59 PM    Confirmation #[id number] W APPROVED 09/11/2013 - 09/11/2014 DMA

## 2013-09-11 NOTE — Patient Instructions (Addendum)
Can take/do all 3 BEFORE breakfast and Before Dinner.  Check blood sugar at lunch or bedtime to do your 3rd check per Dr. Redmond Pulling. Only prime the Byetta open one time each pen. Remember to  Carry saltines with you incase you feel nauseated from the Byetta.   Schedule for Byetta, lantus and blood sugar checks;  Check blood sugar Breakfast- inject 46 units lantus and 5 mcg Byetta 5-30 minutes before meal  Eat lunch   Check blood sugar Dinner- inject 46 units lantus and 5 mcg Byetta 5-30 minutes before meal

## 2013-09-12 ENCOUNTER — Telehealth: Payer: Self-pay | Admitting: Internal Medicine

## 2013-09-12 DIAGNOSIS — G629 Polyneuropathy, unspecified: Secondary | ICD-10-CM | POA: Insufficient documentation

## 2013-09-12 NOTE — Assessment & Plan Note (Signed)
Asthma is stable.  No increased inhaler use.  Will discuss referral for PFTs at visit next mont.

## 2013-09-12 NOTE — Assessment & Plan Note (Addendum)
I started her on amitriptyline 1 year ago for numbness and tingling of lower extremities.  She says it works but it makes her too sleepy.  Will try switching to gabapentin.  She was advised that this could make her drowsy as well.  She will let me know if she cannot tolerate it.  She will follow-up in 1 month.

## 2013-09-12 NOTE — Assessment & Plan Note (Addendum)
She has two cousins with ovarian cancer (28s and 7s) and an aunt who had ovarian cancer.  She denies known family hx of breast cancer but is not sure.  She says her family does not discuss this.  I advised her to have these discussions with her family so she can know her risk.  Will continue to address at future visits.  If she finds out there is breast cancer hx in her family she would likely benefit from referral to genetics counselor for possible screening.  Will obtain urine microalbumin/Cr at next visit.    Will send for mammogram after her 38th birthday (in 5 months).

## 2013-09-12 NOTE — Assessment & Plan Note (Signed)
Headaches likely 2/2 to CPAP coming off at night.  Apparently, she has not gotten new supplies in the 9 months or so that she has had the machine.  [facility] rep was contacted and they are willing to visit patient and provide education regarding when to order supplies (every 3-6 months).

## 2013-09-12 NOTE — Assessment & Plan Note (Signed)
She has c/o of occasional lower thoracic back pain.  No urinary symptoms, no loss of control of bowel or bladder.  It is not waking her up at night.  Likely MSK etiology with weight contributing. - conservative treatment - ice, NSAIDs - follow-up in 1 month

## 2013-09-12 NOTE — Telephone Encounter (Signed)
Called [facility] and spoke with  Representative Danielle. She stated that the patient has not ordered any new supplies from [facility] since she obtained the CPAP Machine in October on 2014.  She stated if the Machine is not staying on her face and coming off it is because she has to reorder her supplies for the mask every 3 to 6 months in order for the machine to properly function.  Andee Poles has asked that an order be supplied for this patient and she will call the patient and Educate her on how to use the machine and when to update supplies in a timely matter.  If that does not work then [place] will ask that the patient bring in the machine to the store for a hands on education demo.

## 2013-09-12 NOTE — Assessment & Plan Note (Addendum)
Lab Results  Component Value Date   HGBA1C 8.8 07/25/2013   HGBA1C 9.7 03/08/2013   HGBA1C 7.8 11/22/2012     Assessment: Diabetes control:  improving based on CBG log Progress toward A1C goal:    improving based on most recent A1c Comments: has improved compliance with Lantus (not missing any nights).  Also checking her CBGs regularly.  Avg CBG 215.  Still with highs, no lows (lowest is 116).  Looking at her readings, SSI Novolog does not seem to be helping.  She is still running higher than I would like despite being on 92 units of Lantus, SSI Novolog and glipizide.  No significant weight change but she feels she eats more with Novolog.  Plan: Medications:  Discontinue Novolog SSI and glipizide.  Split Lantus 92 units into Lantus 46 units BID in order to achieve better absorption.  Add Byetta 0.72mg BID (medicaid will not cover once daily Vyctoza which would be preferable because it requires only 1 injection).  Byetta may help with weight loss in addition to lowering CBG.  She denies known family or personal hx of thyroid cancers or MEN2 that would preclude use of Byetta.  I have stopped glipizide because I do not feel it is helping and there is increased chance of hypoglycemia when used with Byetta.   I have switched her Lantus from vial to Solostar pen for ease of administration and she can use the same pen needles for her Byetta pen.  Hopefully, she can achieve better glucose control with this regimen.   Home glucose monitoring: Frequency:  3 times per day Timing:   Instruction/counseling given: reminded to bring blood glucose meter & log to each visit, reminded to bring medications to each visit, discussed foot care, discussed diet and provided printed educational material   Other plans: RTC in 1 month for follow-up of your DM.

## 2013-09-13 ENCOUNTER — Encounter: Payer: Self-pay | Admitting: Dietician

## 2013-09-13 NOTE — Progress Notes (Signed)
Case discussed with Dr. Wilson at the time of the visit.  We reviewed the resident's history and exam and pertinent patient test results.  I agree with the assessment, diagnosis, and plan of care documented in the resident's note. 

## 2013-09-13 NOTE — Progress Notes (Signed)
Medical Nutrition Therapy:  Appt start time: 1625 end time:  1710 Last visit: 07/2013 Assessment:  Primary concerns today: Weight management and Blood sugar control Patient requests assistance with planning her new diabetes medicine regimen. She is now consistently eating 3 meals/day, +/- 3 snacks/day and watching carb intake.  Continues to work towards lifestyle changes and family is supportive of changes.   Weight increased 4.5# in last month Medications: stopping glipizide, starting lantus pens 92units/day and Byetta 5 mcg twice a day and stopping correction insulin Blood sugars: average for past month was 215, checking blood sugars 1.8 x day, trend shows fairly stable blood sugar throughout day between 217 and 227 except for a dip ~ 5-6 Pm into the 100s.  Preferred Learning Style: No preference indicated  Learning Readiness: Ready, Change in progress Estimated energy needs for weight loss: 1900 calories 150-170 g carbohydrates 95-105 g protein 75-80 g fat  Progress Towards Goal(s):  In progress.   Nutritional Diagnosis:  NB-1.1 Food and nutrition-related knowledge deficit As related to lack of prior exposure to meal planning for weight loss/diabetes improving per patient reports As evidenced by her questions and reported knowledge of portion control.  Lamar 3.3 overweight/obesity as related to extended period of time with excess caloric intake as evidenced by her BMI of 57.6.     Intervention:  Nutrition education about her diabetes medicines, how to administer and their side effects. Support for continued portion control and small snacks if needed.  Coordination of care-discussed Blood sugars and medicine with physician.   Teaching Method Utilized: Visual,Auditory Handouts given during visit include: AVS Barriers to learning/adherence to lifestyle change: sleeping difficulty, obesity, diabetes, medicine that can cause weight gain, hunger Demonstrated degree of understanding via:  Teach  Back   Monitoring/Evaluation:  Dietary intake, exercise, blood sugars, and body weight in 4 week(s).

## 2013-09-14 ENCOUNTER — Other Ambulatory Visit: Payer: Self-pay | Admitting: Internal Medicine

## 2013-09-14 DIAGNOSIS — Z9989 Dependence on other enabling machines and devices: Principal | ICD-10-CM

## 2013-09-14 DIAGNOSIS — G4733 Obstructive sleep apnea (adult) (pediatric): Secondary | ICD-10-CM

## 2013-10-04 ENCOUNTER — Encounter: Payer: Self-pay | Admitting: Family Medicine

## 2013-10-04 ENCOUNTER — Ambulatory Visit (INDEPENDENT_AMBULATORY_CARE_PROVIDER_SITE_OTHER): Payer: Medicaid Other | Admitting: Family Medicine

## 2013-10-04 VITALS — BP 119/73 | HR 92 | Ht 62.0 in | Wt 300.0 lb

## 2013-10-04 DIAGNOSIS — M79609 Pain in unspecified limb: Secondary | ICD-10-CM

## 2013-10-04 DIAGNOSIS — M79644 Pain in right finger(s): Secondary | ICD-10-CM

## 2013-10-04 NOTE — Patient Instructions (Signed)
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 Wear thumb spica brace and wrap of the joint of the thumb at bedtime and as often as possible during the day. Let me know if you want to do both or one of the injections. If injections aren't helping usually the next step is surgery. Your last knee injection was in December in the right knee.

## 2013-10-08 ENCOUNTER — Encounter: Payer: Self-pay | Admitting: Family Medicine

## 2013-10-08 NOTE — Assessment & Plan Note (Signed)
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  Discussed her options and she's not yet ready to try injections.  She will continue with thumb spica brace and splinting of IP joint of thumb.

## 2013-10-08 NOTE — Progress Notes (Signed)
Patient ID: Ana Long, female   DOB: 1975/07/21, 38 y.o.   MRN: [id number]  PCP: Duwaine Maxin, DO  Subjective:   HPI: Patient is a 38 y.o. female here for right thumb/wrist pain.  4/27: Patient denies known injury. She had carpal tunnel release done on 10/02/12 by [facility] but current issue feels different. Describes a locking of her thumb with pain that started near A1 pulley. Has since progressed to entire thumb and goes up forearm on radial side. Had what sounds like a trigger finger injection at [employer] - this helped for a month. Taking naproxen, glucosamine.  8/13: Patient reports she feels about the same as last visit. Initially better with bracing but pain worsened again. Currently 4/10 level of pain. Does have locking of thumb still but most pain up forearm on rdaial side. Taking naproxen.  Past Medical History  Diagnosis Date  . GERD (gastroesophageal reflux disease)   . Diabetes mellitus     intolerant to Metformin  . Herpes genital   . PCOS (polycystic ovarian syndrome)   . Morbid obesity with BMI of 50 to 60 07/23/2009  . Dry skin     hands   . Asthma     prn inhaler  . Arthritis     major joints  . Complication of anesthesia     states is hard to wake up post-op  . Family history of anesthesia complication     mother went into coma during c-section  . Sleep apnea, obstructive     uses CPAP nightly - had sleep study 08/22/2007  . Carpal tunnel syndrome of right wrist 09/2012  . Obesity hypoventilation syndrome 10/30/2012  . OSA on CPAP      AHI was on  11-17-12 to 120/hr, titrated to 15 cm with 3 cm EPR/   . Seasonal allergies     Current Outpatient Prescriptions on File Prior to Visit  Medication Sig Dispense Refill  . ACCU-CHEK FASTCLIX LANCETS MISC 1 each by Other route 2 (two) times daily.  102 each  5  . acyclovir (ZOVIRAX) 400 MG tablet Take 1 tablet (400 mg total) by mouth 2 (two) times daily.  60 tablet  3  .  albuterol (VENTOLIN HFA) 108 (90 BASE) MCG/ACT inhaler Inhale 1-2 puffs into the lungs every 4 (four) hours as needed for wheezing or shortness of breath.  1 Inhaler  5  . chlorpheniramine-HYDROcodone (TUSSIONEX) 10-8 MG/5ML LQCR Take 5 mLs by mouth every 12 (twelve) hours as needed for cough.  115 mL  0  . exenatide (BYETTA) 5 MCG/0.02ML SOPN injection Inject 0.02 mLs (5 mcg total) into the skin 2 (two) times daily with a meal.  1.2 mL  0  . fluconazole (DIFLUCAN) 150 MG tablet Take one tablet by mouth once  as needed for yeast infections  2 tablet  5  . gabapentin (NEURONTIN) 300 MG capsule Take 1 capsule (300 mg total) by mouth 3 (three) times daily.  90 capsule  1  . glucosamine-chondroitin 500-400 MG tablet Take 2 tablets by mouth daily.       Marland Kitchen glucose blood (ACCU-CHEK SMARTVIEW) test strip 1 each by Other route 2 (two) times daily.  100 each  5  . Insulin Glargine (LANTUS SOLOSTAR) 100 UNIT/ML Solostar Pen Inject 46 Units into the skin 2 (two) times daily.  15 mL  11  . Insulin Pen Needle 31G X 5 MM MISC For use with Lantus Solostar and Byetta pens.  200 each  2  . Insulin Syringe-Needle U-100 (INSULIN SYRINGE 1CC/28G) 28G X 1/2" 1 ML MISC Use to administer insulin daily. Dx code: 62.02. Insulin dependent.  100 each  3  . Insulin Syringe-Needle U-100 (INSULIN SYRINGE 1CC/31GX5/16") 31G X 5/16" 1 ML MISC 1 Units by Does not apply route 3 (three) times daily before meals.  100 each  11  . loratadine (CLARITIN) 10 MG tablet Take 1 tablet (10 mg total) by mouth 2 (two) times daily.  60 tablet  0  . naproxen sodium (ANAPROX) 220 MG tablet Take 220 mg by mouth at bedtime as needed (for pain).       . Probiotic Product (PROBIOTIC DAILY PO) Take 2 capsules by mouth every evening.       . ranitidine (ZANTAC) 150 MG tablet Take 150 mg by mouth 2 (two) times daily.      Marland Kitchen senna (SENOKOT) 8.6 MG TABS tablet Take 2 tablets by mouth at bedtime.      . traMADol (ULTRAM) 50 MG tablet Take 1 tablet (50 mg  total) by mouth every 6 (six) hours as needed.  45 tablet  0  . triamcinolone (NASACORT AQ) 55 MCG/ACT AERO nasal inhaler Place 2 sprays into the nose daily.  1 Inhaler  12   No current facility-administered medications on file prior to visit.    Past Surgical History  Procedure Laterality Date  . Hysteroscopy w/d&c  01-08-2002  . Carpal tunnel release Right 10/02/2012    Procedure: RIGHT CARPAL TUNNEL RELEASE ENDOSCOPIC;  Surgeon: Jolyn Nap, MD;  Location: [facility];  Service: Orthopedics;  Laterality: Right;    Allergies  Allergen Reactions  . Citrus Hives and Itching  . Latex Swelling  . Penicillins Hives and Itching  . Soap Hives, Itching and Swelling    PUREX LAUNDRY DETERGENT  . Tomato Hives and Itching  . Doxycycline Other (See Comments)    AFFECTS JOINTS  . Metformin And Related Rash    GI UPSET    History   Social History  . Marital Status: Divorced    Spouse Name: N/A    Number of Children: 2  . Years of Education: 14   Occupational History  . UNEMPLOYED    Social History Main Topics  . Smoking status: Former Smoker -- 10 years    Quit date: 05/24/2010  . Smokeless tobacco: Never Used  . Alcohol Use: Yes     Comment: rarely  . Drug Use: No  . Sexual Activity: Not on file     Comment: Lives w/a partner   Other Topics Concern  . Not on file   Social History Narrative   Single, but lives with a partner   Has 2 children and cares for an infant during the day          Family History  Problem Relation Age of Onset  . Other Mother   . Diabetes Maternal Aunt   . Heart attack Maternal Aunt   . Alzheimer's disease Maternal Uncle   . Cancer Maternal Uncle     prostate  . Diabetes Paternal Aunt   . Cancer Paternal Aunt     brain,mouth  . Alzheimer's disease Paternal Aunt   . Diabetes Paternal Uncle   . Diabetes Maternal Grandmother   . Diabetes Maternal Grandfather   . Leukemia Cousin   . Other Other   . Obesity Other   .  Other Other     BP 119/73  Pulse 92  Ht  5' 2"  (1.575 m)  Wt 300 lb (136.079 kg)  BMI 54.86 kg/m2  Review of Systems: See HPI above.    Objective:  Physical Exam:  Gen: NAD  Right hand/wrist: No gross deformity, swelling, bruising.  Locking with 1st IP joint flexion. TTP greatest at 1st dorsal compartment, less 1st A1 pulley.  No other thumb, hand, wrist tenderness. Able to flex and extend at 1st MCP and IP joint Collateral ligaments intact of IP, MCP joints of 1st digit. NVI distally. Negative tinels. Negative finkelsteins Negative phalens.    Assessment & Plan:  1. 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  Discussed her options and she's not yet ready to try injections.  She will continue with thumb spica brace and splinting of IP joint of thumb.

## 2013-10-10 ENCOUNTER — Encounter: Payer: Self-pay | Admitting: Internal Medicine

## 2013-10-10 ENCOUNTER — Ambulatory Visit (INDEPENDENT_AMBULATORY_CARE_PROVIDER_SITE_OTHER): Payer: Medicaid Other | Admitting: Internal Medicine

## 2013-10-10 VITALS — BP 116/77 | HR 96 | Temp 97.5°F | Wt 309.9 lb

## 2013-10-10 DIAGNOSIS — IMO0001 Reserved for inherently not codable concepts without codable children: Secondary | ICD-10-CM

## 2013-10-10 DIAGNOSIS — IMO0002 Reserved for concepts with insufficient information to code with codable children: Secondary | ICD-10-CM

## 2013-10-10 DIAGNOSIS — M545 Low back pain, unspecified: Secondary | ICD-10-CM

## 2013-10-10 DIAGNOSIS — R21 Rash and other nonspecific skin eruption: Secondary | ICD-10-CM

## 2013-10-10 DIAGNOSIS — E1165 Type 2 diabetes mellitus with hyperglycemia: Secondary | ICD-10-CM

## 2013-10-10 LAB — GLUCOSE, CAPILLARY: Glucose-Capillary: 194 mg/dL — ABNORMAL HIGH (ref 70–99)

## 2013-10-10 NOTE — Progress Notes (Signed)
Subjective:    Patient ID: Ana Long, female    DOB: [birth year], 38 y.o.   MRN: [id number]  HPI Comments: Ana Long is a 38 year old with a PMH of DM type 2 (HgbA1c 8.30 July 2013), asthma, seasonal allergy, OSA on CPAP, GERD and osteoarthritis here for f/u of DM on Byetta.  Ana Long was started on Byetta 18mg BID 4 weeks ago.  Ana Long feels Byetta is not working.  Ana Long feels CBGs are higher. However, looking at her meter download the avg CBG is unchanged.  Ana Long has more stable CBGs with less ups and downs.  No lows.  Ana Long did experience a few episodes of nausea which resolved with crackers.  Otherwise, not too bothered by ADRs.  Ana Long has not been eating breakfast regularly.  Not as hungry until the end of the day and snacking late at night.  Says Ana Long is staying up until 3AM doing housework.  Ana Long feels Ana Long has more energy.    Low back pain x 1 month.  Ana Long has reported this in the past but feels this pain is in a different location.  No loss of bowel or bladder control, no saddle anesthesia or difficulty ambulating.  Pain not waking her at night.  No weight loss (has gained 5 pounds).  Ana Long actually walked to clinic from the main entrance of the hospital which Ana Long has not done in years.  Back pain responds to warm showers, rest.  Ana Long rarely takes NSAIDs or Tramadol for knee pain and they don't seem to help the back pain much.  Rash on chest , legs - itchy.  No new environmental exposure, except Ana Long was looking at a new property in [place] area and thinks Ana Long was bit by an insect (but did not see what bit her) a few weeks ago.  No one else at home with similar.      Review of Systems  Constitutional: Positive for appetite change. Negative for fever, chills and fatigue.       Increased snacking at night.  Eyes: Negative for visual disturbance.  Respiratory: Negative for cough and shortness of breath.   Cardiovascular: Negative for chest pain, palpitations and leg swelling.    Gastrointestinal: Positive for nausea and constipation. Negative for vomiting, abdominal pain, diarrhea and blood in stool.       Occasional constipation responds to colace.  Endocrine: Positive for polydipsia and polyuria.  Genitourinary: Negative for dysuria.  Musculoskeletal: Positive for back pain.  Neurological: Negative for syncope, weakness and light-headedness.       Objective:   Physical Exam  Vitals reviewed. Constitutional: Ana Long is oriented to person, place, and time. Ana Long appears well-developed. No distress.  HENT:  Head: Normocephalic and atraumatic.  Mouth/Throat: Oropharynx is clear and moist. No oropharyngeal exudate.  Eyes: EOM are normal. Pupils are equal, round, and reactive to light.  Cardiovascular: Normal rate, regular rhythm and normal heart sounds.  Exam reveals no gallop and no friction rub.   No murmur heard. Pulmonary/Chest: Effort normal and breath sounds normal. No respiratory distress. Ana Long has no wheezes. Ana Long has no rales.  Abdominal: Soft. Bowel sounds are normal. Ana Long exhibits no distension. There is no tenderness.  Musculoskeletal: Ana Long exhibits tenderness. Ana Long exhibits no edema.  Right lumbar region TTP  Neurological: Ana Long is alert and oriented to person, place, and time.  Skin: Skin is warm. Ana Long is not diaphoretic.  Few small red papules neck/upper chest  Psychiatric: Ana Long has a normal mood and  affect. Her behavior is normal.          Assessment & Plan:  Please see problem based assessment and plan.

## 2013-10-11 ENCOUNTER — Other Ambulatory Visit: Payer: Self-pay | Admitting: Internal Medicine

## 2013-10-11 DIAGNOSIS — R21 Rash and other nonspecific skin eruption: Secondary | ICD-10-CM | POA: Insufficient documentation

## 2013-10-11 LAB — MICROALBUMIN / CREATININE URINE RATIO
CREATININE, URINE: 197.9 mg/dL
MICROALB UR: 2.02 mg/dL — AB (ref 0.00–1.89)
MICROALB/CREAT RATIO: 10.2 mg/g (ref 0.0–30.0)

## 2013-10-11 MED ORDER — EXENATIDE 10 MCG/0.04ML ~~LOC~~ SOPN: 10.0000 ug | PEN_INJECTOR | Freq: Two times a day (BID) | SUBCUTANEOUS | Status: DC

## 2013-10-11 NOTE — Assessment & Plan Note (Signed)
Lab Results  Component Value Date   HGBA1C 8.8 07/25/2013   HGBA1C 9.7 03/08/2013   HGBA1C 7.8 11/22/2012     Assessment: Diabetes control:  uncontrolled Progress toward A1C goal:   will check A1c at next visit in 1 month Comments: CBG average is still around 215 but more stable (less up and downs).  Lowest reading is 100.  She says she has more energy and not particularly bothered by GI ADRs (occasional nausea that responds to crackers).  Because her CBGs seem more stable and she reports more energy and is tolerating Byetta I plan to go up on the dose to 58mg BI.  Her weight has not decreased (has instead increased by 5 pounds) but I think this is related to the late night snacking.  She had started to eat breakfast but now finds she is not hungry in the morning after taking Byetta so she waits to eat until later in the day and appetite is greatest at night (despite evening Byetta dose).    Urine microalbumin/Cr ratio 10.2 so no indication to start ACEI at this time.  Will repeat in 1 year.  Plan: Medications:  Continue Lantus 46 units BID.  INCREASE Byetta to 159m BID.   Home glucose monitoring: Frequency:  3 times per day Timing:   Instruction/counseling given: discussed diet; she agrees to work on eating breakfast (I advised her to try eating breakfast with her kids as it is now back-to-school time) and stop late night snacking.  Educational resources provided: brochure Self management tools provided: copy of home glucose meter download Other plans: I am hoping combo of insulin, increased Byetta and regular meals with help her CBGs and perhaps lower her weight.  RTC in 1 month for follow-up and HgbA1c check.

## 2013-10-11 NOTE — Patient Instructions (Signed)
1. Please increase Byetta to 42mg twice per day.  I have sent the new prescription with increased dose to your pharmacy.  If you have worsening gastrointestinal symptoms or any new symptoms on the higher dose please call me.  Please come back to see me in 1 month so we can check your HgbA1c and see how you are doing on increased Byetta dose.   2. Try heat pad and continue warm showers if that helps your back pain.  Please keep your appointment with OBGyn in case back pain is related to Gyn problem.  If you start to have worse back pain, it does not get better with heat or it wakes you up at night come back to see me.    3. Loratadine should help your rash.  Try to keep an eye out for any new clothing, detergents etc that may be causing the rash.  4. Please take all medications as prescribed.   5. If you have worsening of your symptoms or new symptoms arise, please call the clinic (([phone], or go to the ER immediately if symptoms are severe.  Exenatide injection solution What is this medicine? EXENATIDE (ex EN a tide) is used to improve blood sugar control in adults with type 2 diabetes. This medicine may be used with other oral diabetes medicines. This medicine may be used for other purposes; ask your health care provider or pharmacist if you have questions. COMMON BRAND NAME(S): Byetta What should I tell my health care provider before I take this medicine? They need to know if you have any of these conditions: -history of pancreatitis -kidney disease or if you are on dialysis -stomach problems -an unusual or allergic reaction to exenatide, medicines, foods, dyes, or preservatives -pregnant or trying to get pregnant -breast-feeding How should I use this medicine? This medicine is for injection under the skin of your upper leg, stomach area, or upper arm. You will be taught how to prepare and give this medicine. Use exactly as directed. Take your medicine at regular intervals. Do not take  it more often than directed. It is important that you put your used needles and syringes in a special sharps container. Do not put them in a trash can. If you do not have a sharps container, call your pharmacist or healthcare provider to get one. A special MedGuide will be given to you by the pharmacist with each prescription and refill. Be sure to read this information carefully each time.Talk to your pediatrician regarding the use of this medicine in children. Special care may be needed. Overdosage: If you think you have taken too much of this medicine contact a poison control center or emergency room at once. NOTE: This medicine is only for you. Do not share this medicine with others. What if I miss a dose? If you miss a dose, take it as soon as you can. If it is almost time for your next dose, take only that dose. Do not take double or extra doses. What may interact with this medicine? Do not take this medicine with any of the following medications: -gatifloxacin This medicine may also interact with the following medications: -acetaminophen -birth control pills -digoxin -lisinopril -lovastatin -sulfonylureas -warfarin Many medications may cause changes in blood sugar, these include: -alcohol containing beverages -aspirin and aspirin-like drugs -chloramphenicol -chromium -diuretics -female hormones, such as estrogens or progestins, birth control pills -heart medicines -isoniazid -female hormones or anabolic steroids -medications for weight loss -medicines for allergies, asthma, cold, or cough -  medicines for mental problems -medicines called MAO inhibitors - Nardil, Parnate, Marplan, Eldepryl -niacin -NSAIDS, such as ibuprofen -pentamidine -phenytoin -probenecid -quinolone antibiotics such as ciprofloxacin, levofloxacin, ofloxacin -some herbal dietary supplements -steroid medicines such as prednisone or cortisone -thyroid hormones Some medications can hide the warning  symptoms of low blood sugar (hypoglycemia). You may need to monitor your blood sugar more closely if you are taking one of these medications. These include: -beta-blockers, often used for high blood pressure or heart problems (examples include atenolol, metoprolol, propranolol) -clonidine -guanethidine -reserpine This list may not describe all possible interactions. Give your health care provider a list of all the medicines, herbs, non-prescription drugs, or dietary supplements you use. Also tell them if you smoke, drink alcohol, or use illegal drugs. Some items may interact with your medicine. What should I watch for while using this medicine? Visit your doctor or health care professional for regular checks on your progress. A test called the HbA1C (A1C) will be monitored. This is a simple blood test. It measures your blood sugar control over the last 2 to 3 months. You will receive this test every 3 to 6 months. Learn how to check your blood sugar. Learn the symptoms of low and high blood sugar and how to manage them. Always carry a quick-source of sugar with you in case you have symptoms of low blood sugar. Examples include hard sugar candy or glucose tablets. Make sure others know that you can choke if you eat or drink when you develop serious symptoms of low blood sugar, such as seizures or unconsciousness. They must get medical help at once. Tell your doctor or health care professional if you have high blood sugar. You might need to change the dose of your medicine. If you are sick or exercising more than usual, you might need to change the dose of your medicine. Do not skip meals. Ask your doctor or health care professional if you should avoid alcohol. Many nonprescription cough and cold products contain sugar or alcohol. These can affect blood sugar. Exenatide pens and cartridges should never be shared. Even if the needle is changed, sharing may result in passing of viruses like hepatitis or  HIV. Wear a medical ID bracelet or chain, and carry a card that describes your disease and details of your medicine and dosage times. What side effects may I notice from receiving this medicine? Side effects that you should report to your doctor or health care professional as soon as possible: -allergic reactions like skin rash, itching or hives, swelling of the face, lips, or tongue -breathing problems -signs and symptoms of low blood sugar such as feeling anxious, confusion, dizziness, increased hunger, unusually weak or tired, sweating, shakiness, cold, irritable, headache, blurred vision, fast heartbeat, loss of consciousness -swelling of the ankles, feet, hands -trouble passing urine or change in the amount of urine -unusual stomach pain or upset -unusually weak or tired -vomiting Side effects that usually do not require medical attention (report to your doctor or health care professional if they continue or are bothersome): -constipation -diarrhea -dizziness -headache -heartburn -nausea This list may not describe all possible side effects. Call your doctor for medical advice about side effects. You may report side effects to FDA at 1-800-FDA-1088. Where should I keep my medicine? Keep out of the reach of children. Store unopened pen in a refrigerator between 2 and 8 degrees C (36 and 46 degrees F). Do not freeze or use if the medicine has been frozen. Protect from  light and excessive heat. After you first use the pen, it should be kept at a temperature not to exceed 25 degrees C (77 degrees F). Throw away your used pen after 30 days or after the expiration date, whichever comes first. Do not store your pen with the needle attached. If the needle is left on, medicine may leak from the pen or air bubbles may form in the cartridge. NOTE: This sheet is a summary. It may not cover all possible information. If you have questions about this medicine, talk to your doctor, pharmacist, or health  care provider.  2015, Elsevier/Gold Standard. (2013-04-19 10:22:05)

## 2013-10-11 NOTE — Assessment & Plan Note (Signed)
She has had current episode of low back for a little over 1 month.  It comes and goes, does not wake her from sleep, responds to warm showers and rest.  She has no CNS deficits or S&S of cauda equina.  She does not have infectious symptoms.  Her young age, lack of weight loss or cancer hx make malignancy less likely.  Has family hx of ovarian Ca.  She does report recent changes in menstrual cycle with heavier period followed by lighter period, also has occasional constipation.  She has previously reported upper and lower back pain.  MRI in 2010 was normal.  This may be MSK pain exacerbated by weight.  She has been moving around more as she has increased energy in the past month.  She already has OBGyn appointment scheduled in 3 weeks.   - conservative measures for back pain - continue warm showers since this works, can also try heating pad; seek ER care if new symptoms, difficulty walking, loss of bowel or bladder control, severe pain - see OBGyn in 3 weeks for eval of menstrual irregularities; could back pain be related to GYN etiology; I will route my note to OBGyn - RTC in 1 month for follow-up

## 2013-10-11 NOTE — Assessment & Plan Note (Signed)
Small red papules across neckline.  She says she has them on abdomen and legs but I am unable to see them in those areas.  Reports some itchiness.  She notices this after going to see a new property a few weeks ago.  Denies any other new environmental exposures.  I do not think this is related to Byetta as timing seems to have occurred after visiting new property and she thinks she was bitten by something at that time.  Could also be heat related rash. - she takes loratadine for seasonal allergies, this should help with symptoms of itchiness - RTC in 1 month or sooner if needed for follow-up

## 2013-10-15 ENCOUNTER — Telehealth: Payer: Self-pay | Admitting: *Deleted

## 2013-10-15 NOTE — Telephone Encounter (Signed)
Received PA request from pt's pharmacy for her Byetta Pen.  Medication is preferred if pt has tried and failed metformin.  Metformin is listed on pt's chart as an allergy, PA request was submitted online via [place] [employer]. Request sent for review.Despina Hidden Cassady8/24/201511:55 AM    Confirmation #:[id number] Viona Gilmore

## 2013-10-15 NOTE — Progress Notes (Signed)
INTERNAL MEDICINE TEACHING ATTENDING ADDENDUM - Aldine Contes, MD: I reviewed and discussed at the time of visit with the resident Dr. Redmond Pulling, the patient's medical history, physical examination, diagnosis and results of pertinent tests and treatment and I agree with the patient's care as documented.

## 2013-10-17 ENCOUNTER — Other Ambulatory Visit: Payer: Self-pay | Admitting: *Deleted

## 2013-10-17 NOTE — Telephone Encounter (Signed)
Pt called in stating she takes this med BID and your sig states once a day but you gave her # 60 Can you change to BID and resend for her.

## 2013-10-17 NOTE — Telephone Encounter (Signed)
Request approved 10/15/2013 - 10/15/2014, pharmacy aware.Despina Hidden Cassady8/26/20153:25 PM

## 2013-10-18 MED ORDER — LORATADINE 10 MG PO TABS: 10.0000 mg | ORAL_TABLET | Freq: Two times a day (BID) | ORAL | Status: DC

## 2013-10-25 ENCOUNTER — Telehealth: Payer: Self-pay | Admitting: Internal Medicine

## 2013-10-25 NOTE — Telephone Encounter (Signed)
I returned Ana Long's telephone call.  She wanted to inform that although she felt her blood sugars were slightly better controlled with increased Byetta dose (20mg BID), she feels she has more ADRs (headache).  She stopped Byetta a few days ago and feels symptoms are improving.  I asked if she would be interested in resuming small dose of Byetta but she says she does not want to resume it at all.  I told her we can discuss alternatives to Byetta at her next appt (09/16) and she is agreeable.

## 2013-10-31 ENCOUNTER — Other Ambulatory Visit (HOSPITAL_COMMUNITY)
Admission: RE | Admit: 2013-10-31 | Discharge: 2013-10-31 | Disposition: A | Discharge status: Home / Self Care | Payer: Medicaid Other | Source: Ambulatory Visit | Attending: Obstetrics & Gynecology | Admitting: Obstetrics & Gynecology

## 2013-10-31 ENCOUNTER — Encounter: Payer: Self-pay | Admitting: Obstetrics & Gynecology

## 2013-10-31 ENCOUNTER — Ambulatory Visit (INDEPENDENT_AMBULATORY_CARE_PROVIDER_SITE_OTHER): Payer: Medicaid Other | Admitting: Obstetrics & Gynecology

## 2013-10-31 VITALS — BP 108/67 | HR 89 | Temp 98.8°F

## 2013-10-31 DIAGNOSIS — N898 Other specified noninflammatory disorders of vagina: Secondary | ICD-10-CM

## 2013-10-31 DIAGNOSIS — N760 Acute vaginitis: Secondary | ICD-10-CM | POA: Diagnosis present

## 2013-10-31 DIAGNOSIS — R350 Frequency of micturition: Secondary | ICD-10-CM

## 2013-10-31 LAB — POCT URINALYSIS DIP (DEVICE)
BILIRUBIN URINE: NEGATIVE
Glucose, UA: NEGATIVE mg/dL
Hgb urine dipstick: NEGATIVE
Ketones, ur: NEGATIVE mg/dL
NITRITE: NEGATIVE
PROTEIN: 30 mg/dL — AB
Specific Gravity, Urine: 1.015 (ref 1.005–1.030)
Urobilinogen, UA: 1 mg/dL (ref 0.0–1.0)
pH: 7.5 (ref 5.0–8.0)

## 2013-10-31 NOTE — Progress Notes (Signed)
Pt declined to be weighed.

## 2013-10-31 NOTE — Addendum Note (Signed)
Addended by: Novella Olive on: 10/31/2013 03:05 PM   Modules accepted: Orders

## 2013-10-31 NOTE — Progress Notes (Signed)
Subjective:     Patient ID: Ana Long, female   DOB: 1975/06/21, 38 y.o.   MRN: [id number]  HPI  Pt reports that she was had 'vaginal issues' when she was referred but, sx have resolved.  She also wants her urine evaluated she reports low back pain.  She reports urinary freq occ.  Her glc is not controlled.  Pt reports that she has stopped some of her meds.    Review of Systems     Objective:   Physical Exam BP 108/67  Pulse 89  Temp(Src) 98.8 F (37.1 C)  LMP 10/25/2013  Pt in NAD GU: EGBUS: no lesions Vagina: no blood in vault; no abnormal discharge Cervix: no lesion; no CMT  UA: small leuk nitrates: neg      Assessment:     Low back pain probably not related to UTI- f/u urine cx  Vaginal 'issues' resolved    Plan:     Urine culture F/u prn

## 2013-10-31 NOTE — Patient Instructions (Signed)
Bacterial Vaginosis Bacterial vaginosis is a vaginal infection that occurs when the normal balance of bacteria in the vagina is disrupted. It results from an overgrowth of certain bacteria. This is the most common vaginal infection in women of childbearing age. Treatment is important to prevent complications, especially in pregnant women, as it can cause a premature delivery. CAUSES  Bacterial vaginosis is caused by an increase in harmful bacteria that are normally present in smaller amounts in the vagina. Several different kinds of bacteria can cause bacterial vaginosis. However, the reason that the condition develops is not fully understood. RISK FACTORS Certain activities or behaviors can put you at an increased risk of developing bacterial vaginosis, including:  Having a new sex partner or multiple sex partners.  Douching.  Using an intrauterine device (IUD) for contraception. Women do not get bacterial vaginosis from toilet seats, bedding, swimming pools, or contact with objects around them. SIGNS AND SYMPTOMS  Some women with bacterial vaginosis have no signs or symptoms. Common symptoms include:  Grey vaginal discharge.  A fishlike odor with discharge, especially after sexual intercourse.  Itching or burning of the vagina and vulva.  Burning or pain with urination. DIAGNOSIS  Your health care provider will take a medical history and examine the vagina for signs of bacterial vaginosis. A sample of vaginal fluid may be taken. Your health care provider will look at this sample under a microscope to check for bacteria and abnormal cells. A vaginal pH test may also be done.  TREATMENT  Bacterial vaginosis may be treated with antibiotic medicines. These may be given in the form of a pill or a vaginal cream. A second round of antibiotics may be prescribed if the condition comes back after treatment.  HOME CARE INSTRUCTIONS   Only take over-the-counter or prescription medicines as  directed by your health care provider.  If antibiotic medicine was prescribed, take it as directed. Make sure you finish it even if you start to feel better.  Do not have sex until treatment is completed.  Tell all sexual partners that you have a vaginal infection. They should see their health care provider and be treated if they have problems, such as a mild rash or itching.  Practice safe sex by using condoms and only having one sex partner. SEEK MEDICAL CARE IF:   Your symptoms are not improving after 3 days of treatment.  You have increased discharge or pain.  You have a fever. MAKE SURE YOU:   Understand these instructions.  Will watch your condition.  Will get help right away if you are not doing well or get worse. FOR MORE INFORMATION  Centers for Disease Control and Prevention, Division of STD Prevention: [employer]raud[employer] American Sexual Health Association (ASHA): www.ashastd.org  Document Released: 02/08/2005 Document Revised: 11/29/2012 Document Reviewed: 09/20/2012 [facility] Patient Information 2015 [place], [place]. This information is not intended to replace advice given to you by your health care provider. Make sure you discuss any questions you have with your health care provider.

## 2013-10-31 NOTE — Progress Notes (Signed)
Pt states she having lower back pain and pain over left kidney.  She informed Dr. Redmond Pulling and she told the patient to let this provider know at this visit. Pt provided a urine today if needed.

## 2013-10-31 NOTE — Addendum Note (Signed)
Addended by: Novella Olive on: 10/31/2013 03:13 PM   Modules accepted: Orders

## 2013-10-31 NOTE — Addendum Note (Signed)
Addended by: Michel Harrow on: 10/31/2013 03:24 PM   Modules accepted: Orders

## 2013-11-01 ENCOUNTER — Other Ambulatory Visit: Payer: Self-pay | Admitting: Obstetrics & Gynecology

## 2013-11-01 DIAGNOSIS — B9689 Other specified bacterial agents as the cause of diseases classified elsewhere: Secondary | ICD-10-CM

## 2013-11-01 DIAGNOSIS — N760 Acute vaginitis: Principal | ICD-10-CM

## 2013-11-01 LAB — URINE CULTURE
Colony Count: NO GROWTH
Organism ID, Bacteria: NO GROWTH

## 2013-11-01 MED ORDER — METRONIDAZOLE 500 MG PO TABS: 500.0000 mg | ORAL_TABLET | Freq: Two times a day (BID) | ORAL | Status: AC

## 2013-11-01 NOTE — Progress Notes (Signed)
Called patient and informed of +BV and RX at pharmacy. Patient verbalized understanding and gratitude. NO questions or concerns.

## 2013-11-02 ENCOUNTER — Telehealth: Payer: Self-pay | Admitting: *Deleted

## 2013-11-02 NOTE — Telephone Encounter (Addendum)
Message copied by Langston Reusing on Fri Nov 02, 2013 10:36 AM ------      Message from: Lavonia Drafts      Created: Fri Nov 02, 2013  8:06 AM       Please call pt.  She does not have a UTI.  She should f/u with her primary care physician and work on getting her glucose under control.  That could certainly be there cause of her frequent urination.            Thx,      clh-S  ------ Called pt and line was busy.

## 2013-11-05 MED ORDER — FLUCONAZOLE 150 MG PO TABS: 150.0000 mg | ORAL_TABLET | Freq: Once | ORAL | Status: DC

## 2013-11-05 NOTE — Telephone Encounter (Signed)
Called pt and informed pt that she does not have a UTI and to follow up with her PCP concerning of getting her glucose under control.  Pt stated understanding.

## 2013-11-07 ENCOUNTER — Ambulatory Visit (INDEPENDENT_AMBULATORY_CARE_PROVIDER_SITE_OTHER): Payer: Medicaid Other | Admitting: Internal Medicine

## 2013-11-07 ENCOUNTER — Encounter: Payer: Self-pay | Admitting: Internal Medicine

## 2013-11-07 VITALS — BP 122/60 | HR 81 | Temp 98.1°F | Ht 62.0 in | Wt 304.1 lb

## 2013-11-07 DIAGNOSIS — E1165 Type 2 diabetes mellitus with hyperglycemia: Secondary | ICD-10-CM

## 2013-11-07 DIAGNOSIS — IMO0001 Reserved for inherently not codable concepts without codable children: Secondary | ICD-10-CM

## 2013-11-07 DIAGNOSIS — IMO0002 Reserved for concepts with insufficient information to code with codable children: Secondary | ICD-10-CM

## 2013-11-07 LAB — GLUCOSE, CAPILLARY: GLUCOSE-CAPILLARY: 272 mg/dL — AB (ref 70–99)

## 2013-11-07 LAB — POCT GLYCOSYLATED HEMOGLOBIN (HGB A1C): Hemoglobin A1C: 9.4

## 2013-11-07 MED ORDER — GLUCOSE BLOOD VI STRP: 1.0000 | ORAL_STRIP | Freq: Every day | Status: DC

## 2013-11-07 MED ORDER — INSULIN ASPART 100 UNIT/ML ~~LOC~~ SOLN: 4.0000 [IU] | Freq: Three times a day (TID) | SUBCUTANEOUS | Status: DC

## 2013-11-07 NOTE — Assessment & Plan Note (Addendum)
Lab Results  Component Value Date   HGBA1C 9.4 11/07/2013   HGBA1C 8.8 07/25/2013   HGBA1C 9.7 03/08/2013     Assessment: Diabetes control:  uncontrolled Progress toward A1C goal:   worsening Comments: She stopped Byetta.  Meter download reviewed - average value is 217, highest is 368, lowest is 137.  Her A1c and CBGs indicate she needs something in addition to Lantus.  Will attempt prandial insulin again, this time with a fixed dose instead of SSI.    Plan: Medications:  continue current medications:  Lantus 46 units BID; add Novolog 4 units prior to meals, she usually only eats twice per day. Home glucose monitoring:  Frequency:  five times per day Timing:  AM fasting, before Novolog/meals, before bedtime Instruction/counseling given: reminded to bring blood glucose meter & log to each visit and discussed the need for weight loss Educational resources provided: brochure Self management tools provided: copy of home glucose meter download Other plans: She is likely going to require further insulin increases.  I have advised her to increase frequency of CBG checks to five times daily and to call me if she is running low or continues to be high.  I have encouraged her to work on diet and exercise, especially now that we are adding more insulin.  She says she is planning on joining a gym so she can use the indoor pool for exercise (low impact given her knee OA).  She continues to drink water and is doing better with eating regularly, though still only twice per day.  I have asked her to meet with Butch Penny (DM educator) again for additional education.  I will have return to clinic in 1 month.

## 2013-11-07 NOTE — Patient Instructions (Addendum)
1. Please start taking 4 units of Novolog right before meals.  If you eat three times per day take it three times, if you eat twice daily take it twice.  Do not take it if you must miss a meal.  Please check blood sugar first thing in the morning before eating.  Check it again before your meals/Novolog.  Call me if you are experiencing low blood sugars (below 100 in your case because you start to feel poorly well before you get to 70 or 80).  Respond to low blood sugars as you normally do by drinking 4 ounces of juice or regular soda or eating a small carb rich snack or candy and then checking your blood sugar again.  Also, call me if you are running very high (above 200) and we may be able to increase.  Keep taking Lantus 46 units in the morning and 46 units in the evening.  2. Continue to work on eating regular meals, drinking water and avoiding refined carbs/high sugar.  Continue to work on finding ways to incorporate exercise.  Your idea of using the indoor pool at the gym is a great one.     3. Please take all medications as prescribed.    4. If you have worsening of your symptoms or new symptoms arise, please call the clinic ([phone]), or go to the ER immediately if symptoms are severe.  Return to see me in 1 month.

## 2013-11-07 NOTE — Progress Notes (Signed)
   Subjective:    Patient ID: Ana Long, female    DOB: February 03, 1976, 38 y.o.   MRN: [id number]  HPI Comments: [name] is a 38 year old with a PMH of DM type 2 (HgbA1c 8.30 July 2013), asthma, seasonal allergy, OSA on CPAP, GERD and osteoarthritis here for f/u of DM.  She stopped Byetta due to ADR of headache and concerns over cancer risk.  Since she stopped Byetta she increased her insulin slightly for the past two weeks; she has increased Lantus to 46 in AM and 50 in PM.  She feels better off of Byetta.     Review of Systems  Constitutional: Negative for fever and fatigue.  Eyes: Positive for visual disturbance.       Blurry vision when CBG in 300s  Cardiovascular: Negative for chest pain and leg swelling.  Gastrointestinal: Positive for nausea. Negative for vomiting.  Endocrine: Positive for polydipsia. Negative for polyuria.  Neurological: Negative for light-headedness.       Objective:   Physical Exam  Vitals reviewed. Constitutional: She is oriented to person, place, and time. No distress.  HENT:  Head: Normocephalic and atraumatic.  Mouth/Throat: Oropharynx is clear and moist. No oropharyngeal exudate.  Eyes: EOM are normal. Pupils are equal, round, and reactive to light.  Cardiovascular: Normal rate, regular rhythm and normal heart sounds.  Exam reveals no gallop and no friction rub.   No murmur heard. Pulmonary/Chest: Effort normal and breath sounds normal. No respiratory distress. She has no wheezes. She has no rales.  Abdominal: Soft. Bowel sounds are normal. She exhibits no distension. There is no tenderness.  Musculoskeletal: Normal range of motion. She exhibits no edema and no tenderness.  Neurological: She is alert and oriented to person, place, and time. No cranial nerve deficit.  Skin: Skin is warm. She is not diaphoretic.  Psychiatric: She has a normal mood and affect. Her behavior is normal.          Assessment & Plan:  Please see  problem based assessment and plan.

## 2013-11-08 NOTE — Progress Notes (Signed)
INTERNAL MEDICINE TEACHING ATTENDING ADDENDUM - Aldine Contes, MD: I reviewed and discussed at the time of visit with the resident Dr. Redmond Pulling, the patient's medical history, physical examination, diagnosis and results of pertinent tests and treatment and I agree with the patient's care as documented.

## 2013-11-19 ENCOUNTER — Encounter (INDEPENDENT_AMBULATORY_CARE_PROVIDER_SITE_OTHER): Payer: Self-pay

## 2013-11-19 ENCOUNTER — Ambulatory Visit (INDEPENDENT_AMBULATORY_CARE_PROVIDER_SITE_OTHER): Payer: Medicaid Other | Admitting: Family Medicine

## 2013-11-19 ENCOUNTER — Encounter: Payer: Self-pay | Admitting: Family Medicine

## 2013-11-19 VITALS — BP 138/88 | HR 97 | Ht 61.0 in | Wt 309.0 lb

## 2013-11-19 DIAGNOSIS — M79609 Pain in unspecified limb: Secondary | ICD-10-CM

## 2013-11-19 DIAGNOSIS — M722 Plantar fascial fibromatosis: Secondary | ICD-10-CM

## 2013-11-19 DIAGNOSIS — M79645 Pain in left finger(s): Secondary | ICD-10-CM

## 2013-11-19 NOTE — Patient Instructions (Signed)
You have plantar fasciitis Take tylenol or aleve as needed for pain  Plantar fascia stretch for 20-30 seconds (do 3 of these) in morning Lowering/raise on a step exercises 3 x 10 once or twice a day - this is very important for long term recovery. Can add heel walks, toe walks forward and backward as well Ice heel for 15 minutes as needed. Avoid flat shoes/barefoot walking as much as possible. Arch straps have been shown to help with pain. Heel lifts may help with pain by avoiding fully stretching the plantar fascia except when doing home exercises. Orthotics with heel lift may be helpful (green insoles or dr. Zoe Lan active series insoles). Steroid injection is a consideration for short term pain relief if you are struggling. Physical therapy is also an option.  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 Wear thumb spica brace at bedtime and as often as possible during the day. Let me know if you want to do both or one of the injections. If injections aren't helping usually the next step is surgery.

## 2013-11-20 ENCOUNTER — Telehealth: Payer: Self-pay

## 2013-11-20 DIAGNOSIS — N760 Acute vaginitis: Principal | ICD-10-CM

## 2013-11-20 DIAGNOSIS — B9689 Other specified bacterial agents as the cause of diseases classified elsewhere: Secondary | ICD-10-CM

## 2013-11-20 MED ORDER — METRONIDAZOLE 500 MG PO TABS: 500.0000 mg | ORAL_TABLET | Freq: Two times a day (BID) | ORAL | Status: DC

## 2013-11-20 NOTE — Telephone Encounter (Addendum)
BV on affirm test. Flagy 532m BID X 7 days eprescribed per protocol. Called patient to inform her-- she states she had her PCP write her a RX for it and her discharge and discomfort has since cleared. No need for RX. RX discontinued. No concerns to address at this time.

## 2013-11-26 ENCOUNTER — Encounter: Payer: Self-pay | Admitting: Family Medicine

## 2013-11-26 DIAGNOSIS — M722 Plantar fascial fibromatosis: Secondary | ICD-10-CM | POA: Insufficient documentation

## 2013-11-26 DIAGNOSIS — M79645 Pain in left finger(s): Secondary | ICD-10-CM | POA: Insufficient documentation

## 2013-11-26 NOTE — Assessment & Plan Note (Signed)
most consistent with trigger finger and dequervains tenosynovitis now of the opposite side.  Will try thumb spica on this side.  Consider injections if not improving.

## 2013-11-26 NOTE — Progress Notes (Signed)
Patient ID: Ana Long, female   DOB: [birth year], 38 y.o.   MRN: [id number]  PCP: Duwaine Maxin, DO  Subjective:   HPI: Patient is a 38 y.o. female here for right thumb/wrist pain, bilateral foot pain  4/27: Patient denies known injury. She had carpal tunnel release done on 10/02/12 by [facility] but current issue feels different. Describes a locking of her thumb with pain that started near A1 pulley. Has since progressed to entire thumb and goes up forearm on radial side. Had what sounds like a trigger finger injection at [employer] - this helped for a month. Taking naproxen, glucosamine.  8/13: Patient reports she feels about the same as last visit. Initially better with bracing but pain worsened again. Currently 4/10 level of pain. Does have locking of thumb still but most pain up forearm on radial side. Taking naproxen.  9/28: Patient reports having problems primarily with left moreso than right thumb now.  No numbness. Pain still a 4/10. Not tried bracing for this side.  Also having 7/10 bilateral plantar heel pain. No known injury or swelling. Not tried anything for this.  Past Medical History  Diagnosis Date  . GERD (gastroesophageal reflux disease)   . Diabetes mellitus     intolerant to Metformin  . Herpes genital   . PCOS (polycystic ovarian syndrome)   . Morbid obesity with BMI of 50 to 60 07/23/2009  . Dry skin     hands   . Asthma     prn inhaler  . Arthritis     major joints  . Complication of anesthesia     states is hard to wake up post-op  . Family history of anesthesia complication     mother went into coma during c-section  . Sleep apnea, obstructive     uses CPAP nightly - had sleep study 08/22/2007  . Carpal tunnel syndrome of right wrist 09/2012  . Obesity hypoventilation syndrome 10/30/2012  . OSA on CPAP      AHI was on  11-17-12 to 120/hr, titrated to 15 cm with 3 cm EPR/   . Seasonal allergies     Current  Outpatient Prescriptions on File Prior to Visit  Medication Sig Dispense Refill  . ACCU-CHEK FASTCLIX LANCETS MISC 1 each by Other route 2 (two) times daily.  102 each  5  . acyclovir (ZOVIRAX) 400 MG tablet Take 1 tablet (400 mg total) by mouth 2 (two) times daily.  60 tablet  3  . albuterol (VENTOLIN HFA) 108 (90 BASE) MCG/ACT inhaler Inhale 1-2 puffs into the lungs every 4 (four) hours as needed for wheezing or shortness of breath.  1 Inhaler  5  . chlorpheniramine-HYDROcodone (TUSSIONEX) 10-8 MG/5ML LQCR Take 5 mLs by mouth every 12 (twelve) hours as needed for cough.  115 mL  0  . fluconazole (DIFLUCAN) 150 MG tablet Take one tablet by mouth once  as needed for yeast infections  2 tablet  5  . glucosamine-chondroitin 500-400 MG tablet Take 2 tablets by mouth daily.       Marland Kitchen glucose blood (ACCU-CHEK SMARTVIEW) test strip 1 each by Other route 5 (five) times daily.  100 each  12  . insulin aspart (NOVOLOG) 100 UNIT/ML injection Inject 4 Units into the skin 3 (three) times daily with meals.  10 mL  6  . Insulin Glargine (LANTUS SOLOSTAR) 100 UNIT/ML Solostar Pen Inject 46 Units into the skin 2 (two) times daily.  15 mL  11  .  Insulin Pen Needle 31G X 5 MM MISC For use with Lantus Solostar and Byetta pens.  200 each  2  . Insulin Syringe-Needle U-100 (INSULIN SYRINGE 1CC/28G) 28G X 1/2" 1 ML MISC Use to administer insulin daily. Dx code: 55.02. Insulin dependent.  100 each  3  . Insulin Syringe-Needle U-100 (INSULIN SYRINGE 1CC/31GX5/16") 31G X 5/16" 1 ML MISC 1 Units by Does not apply route 3 (three) times daily before meals.  100 each  11  . loratadine (CLARITIN) 10 MG tablet Take 1 tablet (10 mg total) by mouth 2 (two) times daily.  60 tablet  0  . naproxen sodium (ANAPROX) 220 MG tablet Take 220 mg by mouth at bedtime as needed (for pain).       . Probiotic Product (PROBIOTIC DAILY PO) Take 2 capsules by mouth every evening.       . ranitidine (ZANTAC) 150 MG tablet Take 150 mg by mouth 2 (two)  times daily.      Marland Kitchen senna (SENOKOT) 8.6 MG TABS tablet Take 2 tablets by mouth at bedtime.      . triamcinolone (NASACORT AQ) 55 MCG/ACT AERO nasal inhaler Place 2 sprays into the nose daily.  1 Inhaler  12   No current facility-administered medications on file prior to visit.    Past Surgical History  Procedure Laterality Date  . Hysteroscopy w/d&c  01-08-2002  . Carpal tunnel release Right 10/02/2012    Procedure: RIGHT CARPAL TUNNEL RELEASE ENDOSCOPIC;  Surgeon: Jolyn Nap, MD;  Location: [facility];  Service: Orthopedics;  Laterality: Right;    Allergies  Allergen Reactions  . Citrus Hives and Itching  . Latex Swelling  . Penicillins Hives and Itching  . Soap Hives, Itching and Swelling    PUREX LAUNDRY DETERGENT  . Tomato Hives and Itching  . Doxycycline Other (See Comments)    AFFECTS JOINTS  . Metformin And Related Rash    GI UPSET    History   Social History  . Marital Status: Divorced    Spouse Name: N/A    Number of Children: 2  . Years of Education: 14   Occupational History  . UNEMPLOYED    Social History Main Topics  . Smoking status: Former Smoker -- 10 years    Quit date: 05/24/2010  . Smokeless tobacco: Never Used  . Alcohol Use: Yes     Comment: rarely  . Drug Use: No  . Sexual Activity: Not on file     Comment: Lives w/a partner   Other Topics Concern  . Not on file   Social History Narrative   Single, but lives with a partner   Has 2 children and cares for an infant during the day          Family History  Problem Relation Age of Onset  . Other Mother   . Diabetes Maternal Aunt   . Heart attack Maternal Aunt   . Alzheimer's disease Maternal Uncle   . Cancer Maternal Uncle     prostate  . Diabetes Paternal Aunt   . Cancer Paternal Aunt     brain,mouth  . Alzheimer's disease Paternal Aunt   . Diabetes Paternal Uncle   . Diabetes Maternal Grandmother   . Diabetes Maternal Grandfather   . Leukemia Cousin   .  Other Other   . Obesity Other   . Other Other     BP 138/88  Pulse 97  Ht 5' 1"  (1.549 m)  Wt  309 lb (140.161 kg)  BMI 58.41 kg/m2  LMP 10/25/2013  Review of Systems: See HPI above.    Objective:  Physical Exam:  Gen: NAD  Left hand/wrist: No gross deformity, swelling, bruising.  No locking. TTP at 1st dorsal compartment and 1st A1 pulley.  No other thumb, hand, wrist tenderness. Able to flex and extend at 1st MCP and IP joint Collateral ligaments intact of IP, MCP joints of 1st digit. NVI distally. Negative tinels. Negative finkelsteins Negative phalens.  Bilateral feet/ankles: No gross deformity, swelling, ecchymoses.  Pes planus. FROM TTP bilateral plantar calcaneii at plantar fascia insertion. Negative ant drawer and talar tilt.   Negative syndesmotic compression. Thompsons test negative. NV intact distally.    Assessment & Plan:  1. Left thumb pain - most consistent with trigger finger and dequervains tenosynovitis now of the opposite side.  Will try thumb spica on this side.  Consider injections if not improving.  2. Bilateral plantar fasciitis - Reviewed home exercises and stretches to do daily.  Orthotics, icing, nsaids/tylenol as needed, arch binders.  Avoid flat shoes/barefoot walking as much as possible.  Consider injection, physical therapy, custom orthotics if not improving.

## 2013-11-26 NOTE — Assessment & Plan Note (Signed)
Reviewed home exercises and stretches to do daily.  Orthotics, icing, nsaids/tylenol as needed, arch binders.  Avoid flat shoes/barefoot walking as much as possible.  Consider injection, physical therapy, custom orthotics if not improving.

## 2013-12-05 ENCOUNTER — Encounter: Payer: Medicaid Other | Admitting: Internal Medicine

## 2013-12-12 ENCOUNTER — Ambulatory Visit (INDEPENDENT_AMBULATORY_CARE_PROVIDER_SITE_OTHER): Payer: Medicaid Other | Admitting: Internal Medicine

## 2013-12-12 ENCOUNTER — Encounter: Payer: Self-pay | Admitting: Internal Medicine

## 2013-12-12 VITALS — BP 111/70 | HR 89 | Temp 98.9°F | Ht 61.0 in | Wt 302.6 lb

## 2013-12-12 DIAGNOSIS — E1165 Type 2 diabetes mellitus with hyperglycemia: Secondary | ICD-10-CM

## 2013-12-12 DIAGNOSIS — IMO0002 Reserved for concepts with insufficient information to code with codable children: Secondary | ICD-10-CM

## 2013-12-12 DIAGNOSIS — Z23 Encounter for immunization: Secondary | ICD-10-CM

## 2013-12-12 MED ORDER — INSULIN ASPART 100 UNIT/ML ~~LOC~~ SOLN: 8.0000 [IU] | Freq: Three times a day (TID) | SUBCUTANEOUS | Status: DC

## 2013-12-12 MED ORDER — INSULIN GLARGINE 100 UNIT/ML SOLOSTAR PEN: 50.0000 [IU] | PEN_INJECTOR | Freq: Two times a day (BID) | SUBCUTANEOUS | Status: DC

## 2013-12-12 NOTE — Patient Instructions (Addendum)
1. Please increase your Lantus to 50 units twice per day.  Please take 8 units of Novolog with your two largest meals of the day.  Do not take it with your meal replacement shake.  Continue to check your CBG at least four times per day (first thing in the morning, with meals and in the evening) and bring your meter to the next visit.   2. Please take all medications as prescribed.    3. If you have worsening of your symptoms or new symptoms arise, please call the clinic ([phone]), or go to the ER immediately if symptoms are severe.  Please try to bring all your medicines next time. This helps [place] take good care of you and stops mistakes from medicines that could hurt you.   Diabetes and Exercise Exercising regularly is important. It is not just about losing weight. It has many health benefits, such as:  Improving your overall fitness, flexibility, and endurance.  Increasing your bone density.  Helping with weight control.  Decreasing your body fat.  Increasing your muscle strength.  Reducing stress and tension.  Improving your overall health. People with diabetes who exercise gain additional benefits because exercise:  Reduces appetite.  Improves the body's use of blood sugar (glucose).  Helps lower or control blood glucose.  Decreases blood pressure.  Helps control blood lipids (such as cholesterol and triglycerides).  Improves the body's use of the hormone insulin by:  Increasing the body's insulin sensitivity.  Reducing the body's insulin needs.  Decreases the risk for heart disease because exercising:  Lowers cholesterol and triglycerides levels.  Increases the levels of good cholesterol (such as high-density lipoproteins [HDL]) in the body.  Lowers blood glucose levels. YOUR ACTIVITY PLAN  Choose an activity that you enjoy and set realistic goals. Your health care provider or diabetes educator can help you make an activity plan that works for you. Exercise  regularly as directed by your health care provider. This includes:  Performing resistance training twice a week such as push-ups, sit-ups, lifting weights, or using resistance bands.  Performing 150 minutes of cardio exercises each week such as walking, running, or playing sports.  Staying active and spending no more than 90 minutes at one time being inactive. Even short bursts of exercise are good for you. Three 10-minute sessions spread throughout the day are just as beneficial as a single 30-minute session. Some exercise ideas include:  Taking the dog for a walk.  Taking the stairs instead of the elevator.  Dancing to your favorite song.  Doing an exercise video.  Doing your favorite exercise with a friend. RECOMMENDATIONS FOR EXERCISING WITH TYPE 1 OR TYPE 2 DIABETES   Check your blood glucose before exercising. If blood glucose levels are greater than 240 mg/dL, check for urine ketones. Do not exercise if ketones are present.  Avoid injecting insulin into areas of the body that are going to be exercised. For example, avoid injecting insulin into:  The arms when playing tennis.  The legs when jogging.  Keep a record of:  Food intake before and after you exercise.  Expected peak times of insulin action.  Blood glucose levels before and after you exercise.  The type and amount of exercise you have done.  Review your records with your health care provider. Your health care provider will help you to develop guidelines for adjusting food intake and insulin amounts before and after exercising.  If you take insulin or oral hypoglycemic agents, watch for signs  and symptoms of hypoglycemia. They include:  Dizziness.  Shaking.  Sweating.  Chills.  Confusion.  Drink plenty of water while you exercise to prevent dehydration or heat stroke. Body water is lost during exercise and must be replaced.  Talk to your health care provider before starting an exercise program to  make sure it is safe for you. Remember, almost any type of activity is better than none. Document Released: 05/01/2003 Document Revised: 06/25/2013 Document Reviewed: 07/18/2012 [facility] Patient Information 2015 [place], [place]. This information is not intended to replace advice given to you by your health care provider. Make sure you discuss any questions you have with your health care provider.

## 2013-12-12 NOTE — Assessment & Plan Note (Addendum)
Lab Results  Component Value Date   HGBA1C 9.4 11/07/2013   HGBA1C 8.8 07/25/2013   HGBA1C 9.7 03/08/2013     Assessment: Diabetes control:   Progress toward A1C goal:    Comments: She has started "Herbal-life" diet.  She is also using My Fitness Pal phone app.  She is drinking one shake per day, usually for dinner.  Says she "bottomed out" yesterday evening when she had a shake for dinner. She felt sweaty and shaky at CBG 116 last night.  After she ate, it improved to 140s.  It was 189 prior to bed.  Meter download for the past month reveals an average CBG of 212. AM fasting CBGs are 166 -325.  The 116 last night is her lowest read; her highest read is 325.  She is taking 46 units in the AM and 50 units in the PM (she increased her PM dose from 46 to 50 because of elevated CBGs).  She increased her Novolog from 4 units to 8 units because of high CBGs.  She reports AM fasting CBGs have been around 160s for the past few days since she has started "Herbal-life."  She has not taken Novolog in 3 days because her meal-time sugars have been <130.  She feels that she has had better blood glucose control since starting the "Herbal-life" diet and using the My Fitness Pal app.  She is eating sensible meals (ie chicken breast kale for dinner last night) and drinking one meal replacement shake (90 calories, ? Carbs) per day.  Her weight is down 7 pounds since last visit.  She feels she will continue to make significant strides with continued lifestyle changes.    Plan: Medications:  continue current medications:  INCREASE Lantus to 50 units BID.  Continue Novolog to 8 units BID with two largest meals of the day. Home glucose monitoring:  Four times per day. Frequency:  AM fasting, before Novolog/meals, before bedtime Timing:   Instruction/counseling given: reminded to bring blood glucose meter & log to each visit Educational resources provided: brochure Self management tools provided: copy of home glucose meter  download;home glucose logbook Other plans: Continue healthy meals and uing My Fitness Pal.  Work on incorporating exercise.  RTC in 1 month for follow-up.

## 2013-12-12 NOTE — Progress Notes (Signed)
   Subjective:    Patient ID: Ana Long, female    DOB: November 18, 1975, 38 y.o.   MRN: [id number]  HPI Comments: Ana Long is a 38 year old with a PMH of DM type 2 (HgbA1c 8.30 July 2013), asthma, seasonal allergy, OSA on CPAP, GERD and osteoarthritis here for f/u of DM.  Please see problem based assessment and plan for an update of her DM.      Review of Systems  Constitutional: Negative for appetite change.  Eyes: Negative for visual disturbance.  Respiratory: Negative for shortness of breath.   Cardiovascular: Negative for chest pain.  Genitourinary: Negative for dysuria.       Objective:   Physical Exam  Vitals reviewed. Constitutional: She is oriented to person, place, and time. She appears well-developed. No distress.  HENT:  Head: Normocephalic and atraumatic.  Eyes: EOM are normal. Pupils are equal, round, and reactive to light.  Cardiovascular: Normal rate, regular rhythm and normal heart sounds.  Exam reveals no gallop and no friction rub.   No murmur heard. Pulmonary/Chest: Effort normal and breath sounds normal. No respiratory distress. She has no wheezes. She has no rales.  Abdominal: Soft. Bowel sounds are normal. She exhibits no distension. There is no tenderness.  Musculoskeletal: Normal range of motion. She exhibits edema.  1+ B/L lower extremity edema  Neurological: She is alert and oriented to person, place, and time. No cranial nerve deficit.  Skin: Skin is warm. She is not diaphoretic.  Psychiatric: She has a normal mood and affect. Her behavior is normal.          Assessment & Plan:  Please see problem based assessment and plan.

## 2013-12-13 LAB — GLUCOSE, CAPILLARY: GLUCOSE-CAPILLARY: 123 mg/dL — AB (ref 70–99)

## 2013-12-17 NOTE — Progress Notes (Signed)
Case discussed with Dr. Redmond Pulling soon after the resident saw the patient. We reviewed the resident's history and exam and pertinent patient test results. I agree with the assessment, diagnosis, and plan of care documented in the resident's note.

## 2014-01-08 ENCOUNTER — Ambulatory Visit (INDEPENDENT_AMBULATORY_CARE_PROVIDER_SITE_OTHER): Payer: Medicaid Other | Admitting: Internal Medicine

## 2014-01-08 ENCOUNTER — Encounter: Payer: Self-pay | Admitting: Internal Medicine

## 2014-01-08 VITALS — BP 114/65 | HR 86 | Temp 97.8°F | Ht 61.0 in | Wt 295.6 lb

## 2014-01-08 DIAGNOSIS — IMO0002 Reserved for concepts with insufficient information to code with codable children: Secondary | ICD-10-CM

## 2014-01-08 DIAGNOSIS — E1165 Type 2 diabetes mellitus with hyperglycemia: Secondary | ICD-10-CM

## 2014-01-08 DIAGNOSIS — J302 Other seasonal allergic rhinitis: Secondary | ICD-10-CM

## 2014-01-08 LAB — GLUCOSE, CAPILLARY: GLUCOSE-CAPILLARY: 110 mg/dL — AB (ref 70–99)

## 2014-01-08 MED ORDER — GLIPIZIDE 5 MG PO TABS: 5.0000 mg | ORAL_TABLET | Freq: Two times a day (BID) | ORAL | Status: DC

## 2014-01-08 MED ORDER — LORATADINE 10 MG PO TABS: 10.0000 mg | ORAL_TABLET | Freq: Two times a day (BID) | ORAL | Status: DC

## 2014-01-08 NOTE — Progress Notes (Signed)
   Subjective:    Patient ID: Ana Long, female    DOB: Sep 15, 1975, 38 y.o.   MRN: [id number]  HPI Comments: Ana Long is a 38 year old with a PMH of DM type 2 (HgbA1c 9.4 Sept 2015), asthma, seasonal allergy, OSA on CPAP, GERD and osteoarthritis here for f/u of DM. Please see problem based assessment and plan for an update of her DM.      Review of Systems  Constitutional: Negative for fever, chills and appetite change.  Respiratory: Negative for shortness of breath.   Cardiovascular: Negative for chest pain.  Gastrointestinal: Negative for nausea, vomiting, diarrhea and constipation.  Genitourinary: Negative for dysuria.       Objective:   Physical Exam  Constitutional: She is oriented to person, place, and time. No distress.  HENT:  Head: Normocephalic and atraumatic.  Mouth/Throat: Oropharynx is clear and moist. No oropharyngeal exudate.  Eyes: EOM are normal. Pupils are equal, round, and reactive to light.  Cardiovascular: Normal rate, regular rhythm and normal heart sounds.  Exam reveals no gallop and no friction rub.   No murmur heard. Pulmonary/Chest: Effort normal and breath sounds normal. No respiratory distress. She has no wheezes. She has no rales.  Abdominal: Soft. Bowel sounds are normal. She exhibits no distension. There is no tenderness. There is no rebound.  Musculoskeletal: Normal range of motion. She exhibits no edema or tenderness.  Neurological: She is alert and oriented to person, place, and time. No cranial nerve deficit.  Skin: Skin is warm. She is not diaphoretic.  Psychiatric: She has a normal mood and affect. Her behavior is normal.  Vitals reviewed.         Assessment & Plan:  Please see problem based assessment and plan.

## 2014-01-08 NOTE — Patient Instructions (Signed)
General Instructions: 1. Keep up the good work!  Continue Lantus 50 units twice per day.  You can continue glipizide 80m twice per day.  STOP Novolog for now.  Please keep track of blood sugars and try to test 3-4 times per day.  If blood sugars are dropping too low or you feel hypoglycemic symptoms please call me so we can make adjustments.     2. Please take all medications as prescribed.    3. If you have worsening of your symptoms or new symptoms arise, please call the clinic [phone], or go to the ER immediately if symptoms are severe.   Return to clinic in 1 months (on or after 02/06/14) to check your hgb A1c.    Treatment Goals:  Goals (1 Years of Data) as of 01/08/14          As of Today 12/12/13 11/19/13 11/07/13 10/31/13     Blood Pressure   . Blood Pressure < 130/80  114/65 111/70 138/88 122/60 108/67     Result Component   . HEMOGLOBIN A1C < 7.0     9.4    . LDL CALC < 100            Progress Toward Treatment Goals:  Treatment Goal 01/08/2014  Hemoglobin A1C unable to assess    Self Care Goals & Plans:  Self Care Goal 01/08/2014  Manage my medications take my medicines as prescribed; bring my medications to every visit; refill my medications on time; follow the sick day instructions if I am sick  Monitor my health keep track of my blood glucose; bring my glucose meter and log to each visit; keep track of my blood pressure; keep track of my weight; check my feet daily  Eat healthy foods eat more vegetables; eat fruit for snacks and desserts; eat baked foods instead of fried foods; eat foods that are low in salt; eat smaller portions; drink diet soda or water instead of juice or soda  Be physically active find an activity I enjoy  Prevent falls -  Meeting treatment goals -    Home Blood Glucose Monitoring 01/08/2014  Check my blood sugar 4 times a day  When to check my blood sugar -     Care Management & Community Referrals:  Referral 10/18/2012  Referrals  made for care management support none needed  Referrals made to community resources none

## 2014-01-09 MED ORDER — GLUCOSE BLOOD VI STRP: ORAL_STRIP | Status: DC

## 2014-01-09 NOTE — Progress Notes (Signed)
Case discussed with Dr. Redmond Pulling soon after the resident saw the patient.  We reviewed the resident's history and exam and pertinent patient test results.  I agree with the assessment, diagnosis and plan of care documented in the resident's note.

## 2014-01-09 NOTE — Assessment & Plan Note (Addendum)
Lab Results  Component Value Date   HGBA1C 9.4 11/07/2013   HGBA1C 8.8 07/25/2013   HGBA1C 9.7 03/08/2013     Assessment: Diabetes control: improving Progress toward A1C goal:  unable to assess; due for re-check next month Comments: She has made great progress.  Meter download reviewed.  She is testing 2-3x per day.  Average CBG is 154 with 77% of her reading within target.  The two lowest values were in the 80s.  She has started exercising regularly (about 3 times per week) and activities include water aerobics.  She is eating healthy balanced meals (she has several healthy snacks in her bag today).  Her weight is down 7 pounds since last month's visit (she is now below 300).  Her lifestyle changes have likely contributed the most to her improved glycemic control.  She said she stopped taking the Novolog because CBGs were near 100.  She has resumed Glipizide 34m BID on her own.  She is compliant with Lantus 50 units BID.  Plan: Medications:  continue current medications:  Lantus 50 units BID, RESUME glipizide 528mBID (since she started taking this again on her own a few weeks ago and CBGs are improved), STOP Novolog since she has not been taking it and to reduce risk of hypoglycemia (back on glipizide).   Home glucose monitoring: Frequency: 4 times a day Timing:   Instruction/counseling given: reminded to bring blood glucose meter & log to each visit Educational resources provided: brochure, handout Self management tools provided: copy of home glucose meter download Other plans: Continue current course as she has greatly improved.  I expect to see this reflected in her Hgb A1c at next visit. I have advised her to continue to monitor her CBGs closely because she will likely require less medication as she loses weight.  She was advised to call me if she has CBGs < 80 or starts to have frequent episodes of feeling hypoglycemic.  RTC next month for follow-up and A1c check.

## 2014-01-09 NOTE — Assessment & Plan Note (Addendum)
She continues to request Claritin 49m BID (instead of once daily as originally prescribed).  She received BID rx in clinic during an asthma exacerbation 5 months ago.  She feels this works better for her so she has continued to take it twice daily.  I have advised her the recommended dose is 123mdaily (or 36m98mID) but she insist on taking it BID.  I spoke with two different pharmacist who confirm that besides increased anti-histaminic effects this higher dose should not cause her harm.  I suggested the patient switch to Zyrtec to see if it worked better for her but she would prefer to stick with Claritin. - Claritin 57m32mD - will d/c should she develop ADRs - will plan to reduce dose or switch agents at next visit

## 2014-01-10 ENCOUNTER — Ambulatory Visit: Payer: Medicaid Other | Admitting: Neurology

## 2014-01-10 ENCOUNTER — Ambulatory Visit (INDEPENDENT_AMBULATORY_CARE_PROVIDER_SITE_OTHER): Payer: Medicaid Other | Admitting: Neurology

## 2014-01-10 ENCOUNTER — Encounter: Payer: Self-pay | Admitting: Neurology

## 2014-01-10 DIAGNOSIS — E662 Morbid (severe) obesity with alveolar hypoventilation: Secondary | ICD-10-CM

## 2014-01-10 DIAGNOSIS — J4540 Moderate persistent asthma, uncomplicated: Secondary | ICD-10-CM

## 2014-01-10 HISTORY — DX: Morbid (severe) obesity due to excess calories: E66.01

## 2014-01-10 NOTE — Patient Instructions (Signed)
Medical weight loss program is urgently recommended.  Consider surgical options.  Your residual AHI is higher than desired, will increase pressure by 1 cm water. DME  [facility].

## 2014-01-10 NOTE — Progress Notes (Signed)
[facility]  Provider:  Larey Seat, M D  Referring Provider: Francesca Oman, DO Primary Care Physician:  Duwaine Maxin, DO  Chief Complaint  Patient presents with  . RV Sleep cpap    Rm 10, alone    HPI:  Ana Long is a 38 y.o. female  Is seen here as a referral/ revisit  from Dr. Redmond Pulling  and Cyndy Freeze , CRT at [place].    Ana Long is a 38 year old African American female patient of Dr. Duwaine Maxin. She reports that she was diagnosed with obstructive sleep apnea in the year 2002. The diagnosis was made at [facility] and followed with the CPAP prescription she has not had any adjustments to her CPAP since 2009 , but she had technical difficulties using the machine and by now it seems to have broken. She reports that the water reservoir has at times stopped functioning or became to hot,  and that now she feels that the pressure provided night by night is very variable.  She presented to her D[facility] and was sent here for a reevaluation.  Interval history. Ana Long was evaluated in a split-night Polysomnography dated 11-17-12 at [place]. She endorsed the Epworth Sleepiness Scale at 18 points. The patient helps Christinia at 10 points. Her BMI was 56.9 with a neck circumference of 22 inches. The patient was diagnosed with an AHI of 120- the same as her RDI.  An  average of 30 seconds separated apnea -hypopnea spells. She had 80 obstructive apneas at night but nor centrals or mixed events.  CO2 level rose to a level of 51.44 torr -  indicating that she had obesity hypoventilation,  a term used to for patient but cannot exchange oxygen and CO2. The lowest oxygen desaturation was 73% but the saturation duration total time was only 16.8 minutes.  There were no periodic limb movements noted and her heart rate remained stable. The patient was titrated to 15 cm CPAP with an AHI of 0.0 the  lowest oxygen saturation at this pressure was 89%. She slept for 35.4 minutes of the spinal setting and 22 of these sleep minutes were in REM sleep. She eventually gained normal sleep architecture back once on CPAP.    01-10-14 ; Ana Long endorsed today a sleepiness scale of 13 points down from 18 prior to CPAP use and a fatigue  score of 31 points.   She does not have any bathroom breaks at night, she does not wake up with a headache, still sometimes wakes up with a dry mouth, but not to the same degree as prior to CPAP use.  She adjusted humidity as needed. She is now able to drive longer distances without getting sleepy she reports. She also is able to stay awake as a passenger. She rarely will take a nap now and she doesn't schedule a plan for any naps in her daytime.  She feels that she is better able to concentrate and focus not fighting drowsiness- she is on the second day off a detox treatment but will result in a special diet. She has increased her water intake thus she has occasionally a bathroom break. Download dated 01-10-14 shows 97% compliance for the use of 29 out of 30 days of CPAP CPAP with 15 cm water pressure with 3 cm EPR average daily time of use is 4 hours 25 minutes. She still has a lot of air leaks, the  compliance for over 4 hours of nightly use is only 53%. It seems that the highest residual AHI days are those  with  highest air leaks. I   Review of Systems: Out of a complete 14 system review, the patient complains of only the following symptoms, and all other reviewed systems are negative.  Patient has no interest in changing pressure, feels good, and uses humidifier on highest setting. She had a new nasal pillow issued 12-24-13.     History   Social History  . Marital Status: Divorced    Spouse Name: N/A    Number of Children: 2  . Years of Education: 14   Occupational History  . UNEMPLOYED    Social History Main Topics  . Smoking status: Former Smoker -- 10  years    Quit date: 05/24/2010  . Smokeless tobacco: Never Used  . Alcohol Use: Yes     Comment: rarely  . Drug Use: No  . Sexual Activity: Not on file     Comment: Lives w/a partner   Other Topics Concern  . Not on file   Social History Narrative   Single, but lives with a partner   Has 2 children and cares for an infant during the day          Family History  Problem Relation Age of Onset  . Other Mother   . Diabetes Maternal Aunt   . Heart attack Maternal Aunt   . Alzheimer's disease Maternal Uncle   . Cancer Maternal Uncle     prostate  . Diabetes Paternal Aunt   . Cancer Paternal Aunt     brain,mouth  . Alzheimer's disease Paternal Aunt   . Diabetes Paternal Uncle   . Diabetes Maternal Grandmother   . Diabetes Maternal Grandfather   . Leukemia Cousin   . Other Other   . Obesity Other   . Other Other     Past Medical History  Diagnosis Date  . GERD (gastroesophageal reflux disease)   . Diabetes mellitus     intolerant to Metformin  . Herpes genital   . PCOS (polycystic ovarian syndrome)   . Morbid obesity with BMI of 50 to 60 07/23/2009  . Dry skin     hands   . Asthma     prn inhaler  . Arthritis     major joints  . Complication of anesthesia     states is hard to wake up post-op  . Family history of anesthesia complication     mother went into coma during c-section  . Sleep apnea, obstructive     uses CPAP nightly - had sleep study 08/22/2007  . Carpal tunnel syndrome of right wrist 09/2012  . Obesity hypoventilation syndrome 10/30/2012  . OSA on CPAP      AHI was on  11-17-12 to 120/hr, titrated to 15 cm with 3 cm EPR/   . Seasonal allergies     Past Surgical History  Procedure Laterality Date  . Hysteroscopy w/d&c  01-08-2002  . Carpal tunnel release Right 10/02/2012    Procedure: RIGHT CARPAL TUNNEL RELEASE ENDOSCOPIC;  Surgeon: Jolyn Nap, MD;  Location: [place];  Service: Orthopedics;  Laterality: Right;    Current  Outpatient Prescriptions  Medication Sig Dispense Refill  . ACCU-CHEK FASTCLIX LANCETS MISC 1 each by Other route 2 (two) times daily. 102 each 5  . acyclovir (ZOVIRAX) 400 MG tablet Take 1 tablet (400 mg total) by mouth 2 (two) times  daily. 60 tablet 3  . albuterol (VENTOLIN HFA) 108 (90 BASE) MCG/ACT inhaler Inhale 1-2 puffs into the lungs every 4 (four) hours as needed for wheezing or shortness of breath. 1 Inhaler 5  . chlorpheniramine-HYDROcodone (TUSSIONEX) 10-8 MG/5ML LQCR Take 5 mLs by mouth every 12 (twelve) hours as needed for cough. 115 mL 0  . fluconazole (DIFLUCAN) 150 MG tablet Take one tablet by mouth once  as needed for yeast infections 2 tablet 5  . glipiZIDE (GLUCOTROL) 5 MG tablet Take 1 tablet (5 mg total) by mouth 2 (two) times daily before a meal. 60 tablet 1  . glucosamine-chondroitin 500-400 MG tablet Take 2 tablets by mouth daily.     Marland Kitchen glucose blood (ACCU-CHEK SMARTVIEW) test strip Use to test blood glucose up to 3 times daily.   Dx:E11.65 100 each 12  . Insulin Glargine (LANTUS SOLOSTAR) 100 UNIT/ML Solostar Pen Inject 50 Units into the skin 2 (two) times daily. 15 mL 11  . Insulin Pen Needle 31G X 5 MM MISC For use with Lantus Solostar and Byetta pens. 200 each 2  . Insulin Syringe-Needle U-100 (INSULIN SYRINGE 1CC/28G) 28G X 1/2" 1 ML MISC Use to administer insulin daily. Dx code: 64.02. Insulin dependent. 100 each 3  . Insulin Syringe-Needle U-100 (INSULIN SYRINGE 1CC/31GX5/16") 31G X 5/16" 1 ML MISC 1 Units by Does not apply route 3 (three) times daily before meals. 100 each 11  . loratadine (CLARITIN) 10 MG tablet Take 1 tablet (10 mg total) by mouth 2 (two) times daily. 60 tablet 1  . naproxen sodium (ANAPROX) 220 MG tablet Take 220 mg by mouth at bedtime as needed (for pain).     . Probiotic Product (PROBIOTIC DAILY PO) Take 2 capsules by mouth every evening.     . ranitidine (ZANTAC) 150 MG tablet Take 150 mg by mouth 2 (two) times daily.    Marland Kitchen senna (SENOKOT) 8.6  MG TABS tablet Take 2 tablets by mouth at bedtime.    . triamcinolone (NASACORT AQ) 55 MCG/ACT AERO nasal inhaler Place 2 sprays into the nose daily. 1 Inhaler 12   No current facility-administered medications for this visit.    Allergies as of 01/10/2014 - Review Complete 01/10/2014  Allergen Reaction Noted  . Citrus Hives and Itching 07/20/2012  . Latex Swelling 07/20/2012  . Penicillins Hives and Itching 01/01/2006  . Soap Hives, Itching, and Swelling 07/20/2012  . Tomato Hives and Itching 07/20/2012  . Doxycycline Other (See Comments) 01/01/2006  . Metformin and related Rash 12/13/2010    Vitals: BP 117/76 mmHg  Pulse 90  Resp 14  Ht 5' 2"  (1.575 m)  Wt 293 lb (132.904 kg)  BMI 53.58 kg/m2  LMP 12/23/2013 Last Weight:  Wt Readings from Last 1 Encounters:  01/10/14 293 lb (132.904 kg)   Last Height:   Ht Readings from Last 1 Encounters:  01/10/14 5' 2"  (1.575 m)    Physical exam:  General: The patient is awake, alert and appears not in acute distress. The patient is well groomed. Head: Normocephalic, atraumatic. Neck is supple. Mallampati 4 - but large tonsils for an adult, lateral pillows and a large tongue- all related to upper airway restriction by soft tissue.   Neck circumference: 21 inches, wheezing .  No TMJ, no retrognathia. Can breath through either nasion. Cardiovascular:  Regular rate and rhythm, without  murmurs or carotid bruit,-neck veins. Respiratory: Lungs wheezing to auscultation. Skin:  Without evidence of  Rash, today not edematous at the  ankle.  Trunk: BMI is over 54, morbidly elevated .  Neurologic exam : The patient is awake and alert, oriented to place and time.  Memory subjective  described as intact. There is a normal attention span & concentration ability. Speech is fluent without  dysarthria, dysphonia or aphasia. Speech fluency is fragmented by shortness of breath Mood and affect are appropriate- she attributes her lack of energy to the  untreated apnea since CPAP is broken. .  Cranial nerves: Pupils are equal and briskly reactive to light. Visual fields by finger perimetry are intact. Hearing to finger rub intact.  Facial motor strength is symmetric and tongue  midline.  Motor exam: Normal tone, muscle bulk and symmetric strength in all extremities. Crepitation over both knees, painful restriction of ROM.  Sensory:  Fine touch, pinprick and vibration were tested in all extremities. Proprioception is preserved . Coordination: Rapid alternating movements in the fingers/hands is tested and normal. Gait and station: Patient walks without assistive device-. Wide based. Deep tendon reflexes: in the upper and lower extremities are symmetric and intact. Babinski maneuver  Equivocal/  downgoing.   Assessment:  After physical and neurologic examination, review of laboratory studies, imaging, neurophysiology testing and pre-existing records,   Severest OSA ,   obesity hypoventilation , see SPLIT night Polysomnography report.  Plan:  Treatment plan and additional workup :  Continue OSA treatment with CPAP at 15 cm and 3 cm EPR, new machine continues to work well.  She has lower EDS and fatigue scores. .[facility] follows as DME.    Risk factor education: She is in nutritional counseling and in the process of developing a diet for this morbidly obese patient with DM, has truncal obesity, almost cushingoid  BMI reduction addressed by PCP.

## 2014-02-06 ENCOUNTER — Other Ambulatory Visit: Payer: Self-pay | Admitting: Internal Medicine

## 2014-02-06 ENCOUNTER — Ambulatory Visit (INDEPENDENT_AMBULATORY_CARE_PROVIDER_SITE_OTHER): Payer: Medicaid Other | Admitting: Internal Medicine

## 2014-02-06 ENCOUNTER — Encounter: Payer: Self-pay | Admitting: Internal Medicine

## 2014-02-06 VITALS — BP 105/57 | HR 84 | Temp 98.0°F | Ht 62.0 in | Wt 289.9 lb

## 2014-02-06 DIAGNOSIS — Z1231 Encounter for screening mammogram for malignant neoplasm of breast: Secondary | ICD-10-CM

## 2014-02-06 DIAGNOSIS — B977 Papillomavirus as the cause of diseases classified elsewhere: Secondary | ICD-10-CM

## 2014-02-06 DIAGNOSIS — IMO0002 Reserved for concepts with insufficient information to code with codable children: Secondary | ICD-10-CM

## 2014-02-06 DIAGNOSIS — E1165 Type 2 diabetes mellitus with hyperglycemia: Secondary | ICD-10-CM

## 2014-02-06 DIAGNOSIS — Z0000 Encounter for general adult medical examination without abnormal findings: Secondary | ICD-10-CM

## 2014-02-06 DIAGNOSIS — Z1239 Encounter for other screening for malignant neoplasm of breast: Secondary | ICD-10-CM

## 2014-02-06 LAB — POCT GLYCOSYLATED HEMOGLOBIN (HGB A1C): HEMOGLOBIN A1C: 7.7

## 2014-02-06 LAB — GLUCOSE, CAPILLARY: Glucose-Capillary: 116 mg/dL — ABNORMAL HIGH (ref 70–99)

## 2014-02-06 NOTE — Progress Notes (Signed)
   Subjective:    Patient ID: Ana Long, female    DOB: [birth year], 38 y.o.   MRN: [id number]  HPI Comments: Ana Long is a 38 year old with a PMH of DM type 2 (HgbA1c 9.4 Sept 2015), asthma, seasonal allergy, OSA on CPAP, GERD and osteoarthritis here for f/u of DM. Please see problem based assessment and plan for an update of her DM.      Review of Systems  Constitutional: Negative for appetite change and unexpected weight change.  Respiratory: Negative for shortness of breath.   Cardiovascular: Negative for chest pain, palpitations and leg swelling.  Gastrointestinal: Negative for nausea, vomiting, abdominal pain, diarrhea and constipation.  Genitourinary: Negative for dysuria and hematuria.  Musculoskeletal: Positive for arthralgias.       Occasional knee pain due to OA        Objective:   Physical Exam  Constitutional: She is oriented to person, place, and time. No distress.  HENT:  Head: Normocephalic and atraumatic.  Right Ear: External ear normal.  Left Ear: External ear normal.  Neck: Neck supple.  Cardiovascular: Normal rate, regular rhythm and normal heart sounds.  Exam reveals no gallop and no friction rub.   No murmur heard. Pulmonary/Chest: Effort normal and breath sounds normal. No respiratory distress. She has no wheezes. She has no rales.  Abdominal: Soft. Bowel sounds are normal. She exhibits no distension. There is no tenderness. There is no rebound.  Musculoskeletal: Normal range of motion. She exhibits edema. She exhibits no tenderness.  Trace B/L lower extremity edema.  Neurological: She is alert and oriented to person, place, and time.  Skin: Skin is warm. She is not diaphoretic.  Psychiatric: She has a normal mood and affect. Her behavior is normal.  Vitals reviewed.         Assessment & Plan:  Please see problem based assessment and plan.

## 2014-02-06 NOTE — Assessment & Plan Note (Addendum)
Lab Results  Component Value Date   HGBA1C 7.7 02/06/2014   HGBA1C 9.4 11/07/2013   HGBA1C 8.8 07/25/2013     Assessment: Diabetes control:  improving Progress toward A1C goal:   improving Comments: She has been taking Lantus 50 units DAILY (not BID) for past month (since our last visit).  Compliant with Glipizide 84m BID.  Meter download reviewed.  Average CBG 155, lowest 91, highest 393.  83.3% reading within range.  Continues water aerobics twice per week, personal trainer once per week, walking 40 minutes per day twice.  Drinking water, well balanced meals.  Going to [employer] others who are trying to loose weight.  Says she has felt low twice (related to not eating) so she now carries snack bags to avoid this.  I congratulated her on her success with lifestyle changes, weight loss and improved DM control.   Plan: Medications:  continue current medications: Lantus 50 units DAILY (not BID) and Glipizide 538mBID.  Home glucose monitoring:  3 times Frequency:   Timing:   Instruction/counseling given: reminded to bring blood glucose meter & log to each visit.  Reminded to check CBGs at least 3x daily and call me if she feels low or has lows (<90).   Educational resources provided: brochure (has information) Self management tools provided: copy of home glucose meter download Other plans: Return to clinic in 1 month.  Check lipids at that time.  Now that she is age [age]and diabetic), she will benefit from addition of at least moderate dose statin for CVD prevention.

## 2014-02-06 NOTE — Patient Instructions (Signed)
General Instructions:  1. Please keep up the great work with healthy diet, exercise and taking your medications.  We will refer you for mammogram and PAP smear.  Please return to clinic in 1 month for follow-up.   2. Please take all medications as prescribed.     3. If you have worsening of your symptoms or new symptoms arise, please call the clinic ([phone]), or go to the ER immediately if symptoms are severe.    Treatment Goals:  Goals (1 Years of Data) as of 02/06/14          As of Today 01/10/14 01/08/14 12/12/13 11/19/13     Blood Pressure   . Blood Pressure < 130/80  105/57 117/76 114/65 111/70 138/88     Result Component   . HEMOGLOBIN A1C < 7.0  7.7       . LDL CALC < 100            Progress Toward Treatment Goals:  Treatment Goal 02/06/2014  Hemoglobin A1C improved    Self Care Goals & Plans:  Self Care Goal 02/06/2014  Manage my medications take my medicines as prescribed; bring my medications to every visit; refill my medications on time  Monitor my health -  Eat healthy foods drink diet soda or water instead of juice or soda; eat more vegetables; eat foods that are low in salt; eat baked foods instead of fried foods; eat fruit for snacks and desserts  Be physically active -  Prevent falls -  Meeting treatment goals -    Home Blood Glucose Monitoring 02/06/2014  Check my blood sugar 3 times a day  When to check my blood sugar -     Care Management & Community Referrals:  Referral 02/06/2014  Referrals made for care management support diabetes educator  Referrals made to community resources -

## 2014-02-06 NOTE — Assessment & Plan Note (Signed)
She just turned 38 so I will refer for mammogram today - scheduled for 02/18/14.  She will be due for PAP with HPV co-testing next month.  She would prefer to have this done by her ObGyn.  Will refer to Obgyn.

## 2014-02-07 NOTE — Progress Notes (Signed)
Internal Medicine Clinic Attending  Case discussed with Dr. Wilson soon after the resident saw the patient.  We reviewed the resident's history and exam and pertinent patient test results.  I agree with the assessment, diagnosis, and plan of care documented in the resident's note.  

## 2014-02-18 ENCOUNTER — Ambulatory Visit
Admission: RE | Admit: 2014-02-18 | Discharge: 2014-02-18 | Disposition: A | Discharge status: Home / Self Care | Payer: Medicaid Other | Source: Ambulatory Visit | Attending: Internal Medicine | Admitting: Internal Medicine

## 2014-02-18 DIAGNOSIS — Z1231 Encounter for screening mammogram for malignant neoplasm of breast: Secondary | ICD-10-CM

## 2014-02-25 ENCOUNTER — Ambulatory Visit: Payer: Medicaid Other | Admitting: Family Medicine

## 2014-03-01 ENCOUNTER — Ambulatory Visit: Payer: Medicaid Other | Admitting: Family Medicine

## 2014-03-04 ENCOUNTER — Ambulatory Visit: Payer: Medicaid Other | Admitting: Family Medicine

## 2014-03-07 ENCOUNTER — Ambulatory Visit (INDEPENDENT_AMBULATORY_CARE_PROVIDER_SITE_OTHER): Payer: Medicaid Other | Admitting: Family Medicine

## 2014-03-07 ENCOUNTER — Encounter: Payer: Self-pay | Admitting: Family Medicine

## 2014-03-07 VITALS — BP 129/78 | HR 83 | Ht 61.0 in | Wt 293.0 lb

## 2014-03-07 DIAGNOSIS — M25552 Pain in left hip: Secondary | ICD-10-CM

## 2014-03-07 DIAGNOSIS — M79645 Pain in left finger(s): Secondary | ICD-10-CM

## 2014-03-07 NOTE — Patient Instructions (Signed)
Call me if you want to try a shot for your left thumb trigger finger. Otherwise continue with bracing, naproxen, icing. Your hip pain is due to arthritis. Take tylenol 515m 1-2 tabs three times a day for pain. Glucosamine sulfate 7542mtwice a day is a supplement that may help if you're not allergic to shellfish. Cortisone injections are an option - call me if you'd like usKoreao set you up with this. It's important that you continue to stay active. I'd recommend avoiding squats, lunges. Consider physical therapy to strengthen muscles around the joint that hurts to take pressure off of the joint itself. Shoe inserts with good arch support may be helpful. Heat or ice 15 minutes at a time 3-4 times a day as needed to help with pain. Water aerobics and cycling with low resistance are the best two types of exercise for arthritis.

## 2014-03-12 DIAGNOSIS — M25552 Pain in left hip: Secondary | ICD-10-CM | POA: Insufficient documentation

## 2014-03-12 NOTE — Assessment & Plan Note (Signed)
most consistent with trigger finger.  Encouraged bracing and offered injection which she declined again today - call us if she decides to go ahead with this.

## 2014-03-12 NOTE — Assessment & Plan Note (Signed)
2/2 DJD.  Discussed tylenol, glucosamine, considering injection for this if pain worsens.  General strengthening exercises of hip, avoid squats and lunges though.  Consider physical therapy.  Ice/heat as needed.  Water aerobics and cycling recommended.  F/u prn.

## 2014-03-12 NOTE — Progress Notes (Signed)
Patient ID: Ana Long, female   DOB: [birth year], 39 y.o.   MRN: [id number]  PCP: Duwaine Maxin, DO  Subjective:   HPI: Patient is a 38 y.o. female here for left thumb, left hip pain.  4/27: Patient denies known injury. She had carpal tunnel release done on 10/02/12 by [facility] but current issue feels different. Describes a locking of her thumb with pain that started near A1 pulley. Has since progressed to entire thumb and goes up forearm on radial side. Had what sounds like a trigger finger injection at [employer] - this helped for a month. Taking naproxen, glucosamine.  8/13: Patient reports she feels about the same as last visit. Initially better with bracing but pain worsened again. Currently 4/10 level of pain. Does have locking of thumb still but most pain up forearm on radial side. Taking naproxen.  9/28: Patient reports having problems primarily with left moreso than right thumb now.  No numbness. Pain still a 4/10. Not tried bracing for this side.  Also having 7/10 bilateral plantar heel pain. No known injury or swelling. Not tried anything for this.  03/07/14: Patient reports her left thumb still causing problems. Tried some bracing - still does not want an injection. No numbness/tingling. Is catching and locking on her - not as much wrist pain now. Left hip now hurting as well anteriorly. Worse with a lot of ambulation, after sitting for a prolonged period. Would like to do some ankle strengthening exercises as well. Past Medical History  Diagnosis Date  . GERD (gastroesophageal reflux disease)   . Diabetes mellitus     intolerant to Metformin  . Herpes genital   . PCOS (polycystic ovarian syndrome)   . Morbid obesity with BMI of 50 to 60 07/23/2009  . Dry skin     hands   . Asthma     prn inhaler  . Arthritis     major joints  . Complication of anesthesia     states is hard to wake up post-op  . Family history of  anesthesia complication     mother went into coma during c-section  . Sleep apnea, obstructive     uses CPAP nightly - had sleep study 08/22/2007  . Carpal tunnel syndrome of right wrist 09/2012  . Obesity hypoventilation syndrome 10/30/2012  . OSA on CPAP      AHI was on  11-17-12 to 120/hr, titrated to 15 cm with 3 cm EPR/   . Seasonal allergies   . Obesity, morbid 01/10/2014    Current Outpatient Prescriptions on File Prior to Visit  Medication Sig Dispense Refill  . ACCU-CHEK FASTCLIX LANCETS MISC 1 each by Other route 2 (two) times daily. 102 each 5  . acyclovir (ZOVIRAX) 400 MG tablet Take 1 tablet (400 mg total) by mouth 2 (two) times daily. 60 tablet 3  . albuterol (VENTOLIN HFA) 108 (90 BASE) MCG/ACT inhaler Inhale 1-2 puffs into the lungs every 4 (four) hours as needed for wheezing or shortness of breath. 1 Inhaler 5  . chlorpheniramine-HYDROcodone (TUSSIONEX) 10-8 MG/5ML LQCR Take 5 mLs by mouth every 12 (twelve) hours as needed for cough. 115 mL 0  . fluconazole (DIFLUCAN) 150 MG tablet Take one tablet by mouth once  as needed for yeast infections 2 tablet 5  . glipiZIDE (GLUCOTROL) 5 MG tablet Take 1 tablet (5 mg total) by mouth 2 (two) times daily before a meal. 60 tablet 1  . glucosamine-chondroitin 500-400 MG tablet Take 2 tablets  by mouth daily.     Marland Kitchen glucose blood (ACCU-CHEK SMARTVIEW) test strip Use to test blood glucose up to 3 times daily.   Dx:E11.65 100 each 12  . Insulin Glargine (LANTUS SOLOSTAR) 100 UNIT/ML Solostar Pen Inject 50 Units into the skin 2 (two) times daily. 15 mL 11  . Insulin Pen Needle 31G X 5 MM MISC For use with Lantus Solostar and Byetta pens. 200 each 2  . Insulin Syringe-Needle U-100 (INSULIN SYRINGE 1CC/28G) 28G X 1/2" 1 ML MISC Use to administer insulin daily. Dx code: 21.02. Insulin dependent. 100 each 3  . Insulin Syringe-Needle U-100 (INSULIN SYRINGE 1CC/31GX5/16") 31G X 5/16" 1 ML MISC 1 Units by Does not apply route 3 (three) times daily  before meals. 100 each 11  . loratadine (CLARITIN) 10 MG tablet Take 1 tablet (10 mg total) by mouth 2 (two) times daily. 60 tablet 1  . naproxen sodium (ANAPROX) 220 MG tablet Take 220 mg by mouth at bedtime as needed (for pain).     . Probiotic Product (PROBIOTIC DAILY PO) Take 2 capsules by mouth every evening.     . ranitidine (ZANTAC) 150 MG tablet Take 150 mg by mouth 2 (two) times daily.    Marland Kitchen senna (SENOKOT) 8.6 MG TABS tablet Take 2 tablets by mouth at bedtime.    . triamcinolone (NASACORT AQ) 55 MCG/ACT AERO nasal inhaler Place 2 sprays into the nose daily. 1 Inhaler 12   No current facility-administered medications on file prior to visit.    Past Surgical History  Procedure Laterality Date  . Hysteroscopy w/d&c  01-08-2002  . Carpal tunnel release Right 10/02/2012    Procedure: RIGHT CARPAL TUNNEL RELEASE ENDOSCOPIC;  Surgeon: Jolyn Nap, MD;  Location: [facility];  Service: Orthopedics;  Laterality: Right;    Allergies  Allergen Reactions  . Citrus Hives and Itching  . Latex Swelling  . Penicillins Hives and Itching  . Soap Hives, Itching and Swelling    PUREX LAUNDRY DETERGENT  . Tomato Hives and Itching  . Doxycycline Other (See Comments)    AFFECTS JOINTS  . Metformin And Related Rash    GI UPSET    History   Social History  . Marital Status: Divorced    Spouse Name: N/A    Number of Children: 2  . Years of Education: 14   Occupational History  . UNEMPLOYED    Social History Main Topics  . Smoking status: Former Smoker -- 10 years    Quit date: 05/24/2010  . Smokeless tobacco: Never Used  . Alcohol Use: 0.0 oz/week    0 Not specified per week     Comment: rarely  . Drug Use: No  . Sexual Activity: Not on file     Comment: Lives w/a partner   Other Topics Concern  . Not on file   Social History Narrative   Single, but lives with a partner   Has 2 children and cares for an infant during the day          Family History   Problem Relation Age of Onset  . Other Mother   . Diabetes Maternal Aunt   . Heart attack Maternal Aunt   . Alzheimer's disease Maternal Uncle   . Cancer Maternal Uncle     prostate  . Diabetes Paternal Aunt   . Cancer Paternal Aunt     brain,mouth  . Alzheimer's disease Paternal Aunt   . Diabetes Paternal Uncle   .  Diabetes Maternal Grandmother   . Diabetes Maternal Grandfather   . Leukemia Cousin   . Other Other   . Obesity Other   . Other Other     BP 129/78 mmHg  Pulse 83  Ht 5' 1"  (1.549 m)  Wt 293 lb (132.904 kg)  BMI 55.39 kg/m2  LMP 01/26/2014  Review of Systems: See HPI above.    Objective:  Physical Exam:  Gen: NAD  Left hand/wrist: No gross deformity, swelling, bruising.  No locking. TTP at A1 pulley.  No 1st dorsal compartment, other thumb, hand, wrist tenderness. Able to flex and extend at 1st MCP and IP joint Collateral ligaments intact of IP, MCP joints of 1st digit. NVI distally. Negative tinels. Negative finkelsteins Negative phalens.  Left hip: No gross deformity, swelling, bruising. No TTP of greater trochanter, elsewhere about hip or low back. ROM only 10 degrees IR and painful.   Strength 5/5 with hip flexion, knee flexion/extension. Positive logroll.    Assessment & Plan:  1. Left thumb pain - most consistent with trigger finger.  Encouraged bracing and offered injection which she declined again today - call us if she decides to go ahead with this.  2. Left hip pain - 2/2 DJD.  Discussed tylenol, glucosamine, considering injection for this if pain worsens.  General strengthening exercises of hip, avoid squats and lunges though.  Consider physical therapy.  Ice/heat as needed.  Water aerobics and cycling recommended.  F/u prn.

## 2014-03-20 ENCOUNTER — Other Ambulatory Visit: Payer: Self-pay | Admitting: Internal Medicine

## 2014-03-20 MED ORDER — INSULIN PEN NEEDLE 31G X 5 MM MISC: Status: DC

## 2014-03-21 ENCOUNTER — Encounter: Payer: Medicaid Other | Admitting: Family Medicine

## 2014-03-22 ENCOUNTER — Encounter: Payer: Medicaid Other | Admitting: Family Medicine

## 2014-03-27 LAB — HM DIABETES EYE EXAM

## 2014-03-29 ENCOUNTER — Encounter: Payer: Self-pay | Admitting: *Deleted

## 2014-04-02 ENCOUNTER — Other Ambulatory Visit: Payer: Self-pay | Admitting: Internal Medicine

## 2014-04-04 ENCOUNTER — Ambulatory Visit (INDEPENDENT_AMBULATORY_CARE_PROVIDER_SITE_OTHER): Payer: Medicaid Other | Admitting: Family Medicine

## 2014-04-04 ENCOUNTER — Encounter: Payer: Self-pay | Admitting: Family Medicine

## 2014-04-04 ENCOUNTER — Encounter (INDEPENDENT_AMBULATORY_CARE_PROVIDER_SITE_OTHER): Payer: Self-pay

## 2014-04-04 VITALS — BP 117/74 | HR 85 | Ht 61.0 in

## 2014-04-04 DIAGNOSIS — M25571 Pain in right ankle and joints of right foot: Secondary | ICD-10-CM

## 2014-04-04 NOTE — Patient Instructions (Signed)
You have a severe posterior tibial tendinitis.   Icing 15 minutes at a time 3-4 times a day. Naproxen twice a day with food regularly for 7-10 days then as needed. Compression as you have been. Avoid barefoot walking, flat shoes. Wear inserts with good arch support at all times when up and walking around. I'd recommend avoiding running, speed walking, elliptical, squats, lunges, stairs as much as possible while you rehab this for the next 7-10 days. Ideally you would work out in the pool or on an exercise bike in the meantime. Do home exercises 3 sets of 10 once a day. Follow up with me in 1 month.

## 2014-04-08 DIAGNOSIS — M25571 Pain in right ankle and joints of right foot: Secondary | ICD-10-CM | POA: Insufficient documentation

## 2014-04-08 NOTE — Assessment & Plan Note (Signed)
2/2 severe posterior tibialis tendinitis.  Icing, naproxen.  Compression sleeve and elevation as needed for swelling.  Stressed importance of arch support for this condition and not barefoot walking.  Start home exercises daily as well.  F/u in 1 month.

## 2014-04-08 NOTE — Progress Notes (Signed)
PCP: Duwaine Maxin, DO  Subjective:   HPI: Patient is a 39 y.o. female here for right ankle pain.  Patient reports she's had 2 days of worsening right ankle pain. Feels this medially. No known injury or trauma. Wearing an elastic compression sleeve. Recalls doing an hour of walking this past Tuesday that may have flared this up. More swelling than usual. Has been elevating and using heat.  Past Medical History  Diagnosis Date  . GERD (gastroesophageal reflux disease)   . Diabetes mellitus     intolerant to Metformin  . Herpes genital   . PCOS (polycystic ovarian syndrome)   . Morbid obesity with BMI of 50 to 60 07/23/2009  . Dry skin     hands   . Asthma     prn inhaler  . Arthritis     major joints  . Complication of anesthesia     states is hard to wake up post-op  . Family history of anesthesia complication     mother went into coma during c-section  . Sleep apnea, obstructive     uses CPAP nightly - had sleep study 08/22/2007  . Carpal tunnel syndrome of right wrist 09/2012  . Obesity hypoventilation syndrome 10/30/2012  . OSA on CPAP      AHI was on  11-17-12 to 120/hr, titrated to 15 cm with 3 cm EPR/   . Seasonal allergies   . Obesity, morbid 01/10/2014    Current Outpatient Prescriptions on File Prior to Visit  Medication Sig Dispense Refill  . ACCU-CHEK FASTCLIX LANCETS MISC Inject 1 each into the skin QID. use for testing blood sugar 2 to 4 times daily. 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 Insulin dependent 102 each 4  . acyclovir (ZOVIRAX) 400 MG tablet TAKE ONE TABLET BY MOUTH TWICE DAILY  60 tablet 2  . albuterol (VENTOLIN HFA) 108 (90 BASE) MCG/ACT inhaler Inhale 1-2 puffs into the lungs every 4 (four) hours as needed for wheezing or shortness of breath. 1 Inhaler 5  . chlorpheniramine-HYDROcodone (TUSSIONEX) 10-8 MG/5ML LQCR Take 5 mLs by mouth every 12 (twelve) hours as needed for cough. 115 mL 0  . fluconazole (DIFLUCAN) 150 MG tablet Take one tablet by mouth once  as needed  for yeast infections 2 tablet 5  . glipiZIDE (GLUCOTROL) 5 MG tablet Take 1 tablet (5 mg total) by mouth 2 (two) times daily before a meal. 60 tablet 1  . glucosamine-chondroitin 500-400 MG tablet Take 2 tablets by mouth daily.     Marland Kitchen glucose blood (ACCU-CHEK SMARTVIEW) test strip Use to test blood glucose up to 3 times daily.   Dx:E11.65 100 each 12  . Insulin Glargine (LANTUS SOLOSTAR) 100 UNIT/ML Solostar Pen Inject 50 Units into the skin 2 (two) times daily. 15 mL 11  . Insulin Pen Needle 31G X 5 MM MISC Use to inject insulin into the skin 2 times daily. 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 Insulin dependent 100 each 6  . Insulin Syringe-Needle U-100 (INSULIN SYRINGE 1CC/28G) 28G X 1/2" 1 ML MISC Use to administer insulin daily. Dx code: 47.02. Insulin dependent. 100 each 3  . Insulin Syringe-Needle U-100 (INSULIN SYRINGE 1CC/31GX5/16") 31G X 5/16" 1 ML MISC 1 Units by Does not apply route 3 (three) times daily before meals. 100 each 11  . loratadine (CLARITIN) 10 MG tablet Take 1 tablet (10 mg total) by mouth 2 (two) times daily. 60 tablet 1  . naproxen sodium (ANAPROX) 220 MG tablet Take 220 mg by mouth at bedtime as needed (  for pain).     . Probiotic Product (PROBIOTIC DAILY PO) Take 2 capsules by mouth every evening.     . ranitidine (ZANTAC) 150 MG tablet Take 150 mg by mouth 2 (two) times daily.    Marland Kitchen senna (SENOKOT) 8.6 MG TABS tablet Take 2 tablets by mouth at bedtime.    . triamcinolone (NASACORT AQ) 55 MCG/ACT AERO nasal inhaler Place 2 sprays into the nose daily. 1 Inhaler 12   No current facility-administered medications on file prior to visit.    Past Surgical History  Procedure Laterality Date  . Hysteroscopy w/d&c  01-08-2002  . Carpal tunnel release Right 10/02/2012    Procedure: RIGHT CARPAL TUNNEL RELEASE ENDOSCOPIC;  Surgeon: Jolyn Nap, MD;  Location: [facility];  Service: Orthopedics;  Laterality: Right;    Allergies  Allergen Reactions  . Citrus Hives and Itching   . Latex Swelling  . Penicillins Hives and Itching  . Soap Hives, Itching and Swelling    PUREX LAUNDRY DETERGENT  . Tomato Hives and Itching  . Doxycycline Other (See Comments)    AFFECTS JOINTS  . Metformin And Related Rash    GI UPSET    History   Social History  . Marital Status: Divorced    Spouse Name: N/A  . Number of Children: 2  . Years of Education: 14   Occupational History  . UNEMPLOYED    Social History Main Topics  . Smoking status: Former Smoker -- 10 years    Quit date: 05/24/2010  . Smokeless tobacco: Never Used  . Alcohol Use: 0.0 oz/week    0 Standard drinks or equivalent per week     Comment: rarely  . Drug Use: No  . Sexual Activity: Not on file     Comment: Lives w/a partner   Other Topics Concern  . Not on file   Social History Narrative   Single, but lives with a partner   Has 2 children and cares for an infant during the day          Family History  Problem Relation Age of Onset  . Other Mother   . Diabetes Maternal Aunt   . Heart attack Maternal Aunt   . Alzheimer's disease Maternal Uncle   . Cancer Maternal Uncle     prostate  . Diabetes Paternal Aunt   . Cancer Paternal Aunt     brain,mouth  . Alzheimer's disease Paternal Aunt   . Diabetes Paternal Uncle   . Diabetes Maternal Grandmother   . Diabetes Maternal Grandfather   . Leukemia Cousin   . Other Other   . Obesity Other   . Other Other     BP 117/74 mmHg  Pulse 85  Ht 5' 1"  (1.549 m)  Review of Systems: See HPI above.    Objective:  Physical Exam:  Gen: NAD  Right ankle/foot: Pes planus. No other gross deformity, swelling, ecchymoses FROM with 5/5 strength all directions - pain on internal rotation. TTP along post tibialis tendon course.  No other tenderness of foot or ankle. Negative ant drawer and talar tilt.   Negative syndesmotic compression. Thompsons test negative. NV intact distally.    Assessment & Plan:  1. Right ankle pain - 2/2 severe  posterior tibialis tendinitis.  Icing, naproxen.  Compression sleeve and elevation as needed for swelling.  Stressed importance of arch support for this condition and not barefoot walking.  Start home exercises daily as well.  F/u in 1 month.

## 2014-04-15 ENCOUNTER — Other Ambulatory Visit: Payer: Self-pay | Admitting: Internal Medicine

## 2014-04-16 ENCOUNTER — Other Ambulatory Visit (HOSPITAL_COMMUNITY)
Admission: RE | Admit: 2014-04-16 | Discharge: 2014-04-16 | Disposition: A | Discharge status: Home / Self Care | Payer: Medicaid Other | Source: Ambulatory Visit | Attending: Obstetrics & Gynecology | Admitting: Obstetrics & Gynecology

## 2014-04-16 ENCOUNTER — Encounter: Payer: Self-pay | Admitting: Obstetrics & Gynecology

## 2014-04-16 ENCOUNTER — Ambulatory Visit (INDEPENDENT_AMBULATORY_CARE_PROVIDER_SITE_OTHER): Payer: Medicaid Other | Admitting: Obstetrics & Gynecology

## 2014-04-16 VITALS — BP 97/60 | HR 88 | Wt 288.0 lb

## 2014-04-16 DIAGNOSIS — R87810 Cervical high risk human papillomavirus (HPV) DNA test positive: Secondary | ICD-10-CM | POA: Diagnosis present

## 2014-04-16 DIAGNOSIS — Z01419 Encounter for gynecological examination (general) (routine) without abnormal findings: Secondary | ICD-10-CM | POA: Diagnosis not present

## 2014-04-16 DIAGNOSIS — Z113 Encounter for screening for infections with a predominantly sexual mode of transmission: Secondary | ICD-10-CM | POA: Insufficient documentation

## 2014-04-16 DIAGNOSIS — Z124 Encounter for screening for malignant neoplasm of cervix: Secondary | ICD-10-CM

## 2014-04-16 DIAGNOSIS — Z0000 Encounter for general adult medical examination without abnormal findings: Secondary | ICD-10-CM

## 2014-04-16 DIAGNOSIS — Z1151 Encounter for screening for human papillomavirus (HPV): Secondary | ICD-10-CM | POA: Diagnosis present

## 2014-04-16 NOTE — Progress Notes (Signed)
   Subjective:    Patient ID: Ana Long, female    DOB: [birth year], 39 y.o.   MRN: [id number]  HPI This S AAP0 was referred here for a pap smear. Her mammogram is UTD. She has no gyn complaints today. She gave a h/o heavy periods, but she says that issue has resolved now.   Review of Systems     Objective:   Physical Exam Mobidly obese WHBFNAD Breathing and ambulating normally EG- no lesions Vagina, cervix normal No masses palpable       Assessment & Plan:  Preventative care- pap smear with cotesting Cervical cultures as requested by the patient

## 2014-04-17 LAB — CYTOLOGY - PAP

## 2014-04-22 ENCOUNTER — Encounter: Payer: Self-pay | Admitting: Internal Medicine

## 2014-06-19 ENCOUNTER — Telehealth: Payer: Self-pay | Admitting: *Deleted

## 2014-06-19 ENCOUNTER — Other Ambulatory Visit: Payer: Self-pay | Admitting: Internal Medicine

## 2014-06-19 NOTE — Telephone Encounter (Signed)
RETURNED CALL TO PATIENT, PHONE NOT ABLE TO TAKE MESSAGES. SENT MESSAGE TO DR Redmond Pulling PATIENT IS REQUESTING REFERRAL FOR WEIGHT MANGEMENT. Ana Long NTII  4-27-016  11:38AM

## 2014-06-27 NOTE — Addendum Note (Signed)
Addended by: Orson Gear on: 06/27/2014 04:53 PM   Modules accepted: Orders

## 2014-07-02 ENCOUNTER — Ambulatory Visit: Payer: Medicaid Other | Admitting: Internal Medicine

## 2014-07-03 ENCOUNTER — Telehealth: Payer: Self-pay | Admitting: Internal Medicine

## 2014-07-03 NOTE — Telephone Encounter (Signed)
Call to patient to confirm appointment for 07/05/14 at 10:15 no answer

## 2014-07-04 ENCOUNTER — Other Ambulatory Visit: Payer: Self-pay | Admitting: Internal Medicine

## 2014-07-04 DIAGNOSIS — E1165 Type 2 diabetes mellitus with hyperglycemia: Secondary | ICD-10-CM

## 2014-07-04 DIAGNOSIS — IMO0002 Reserved for concepts with insufficient information to code with codable children: Secondary | ICD-10-CM

## 2014-07-05 ENCOUNTER — Ambulatory Visit: Payer: Medicaid Other | Admitting: Internal Medicine

## 2014-07-08 ENCOUNTER — Ambulatory Visit: Payer: Medicaid Other | Admitting: Internal Medicine

## 2014-07-09 ENCOUNTER — Ambulatory Visit (INDEPENDENT_AMBULATORY_CARE_PROVIDER_SITE_OTHER): Payer: Medicaid Other | Admitting: Internal Medicine

## 2014-07-09 ENCOUNTER — Encounter: Payer: Self-pay | Admitting: Internal Medicine

## 2014-07-09 VITALS — BP 122/69 | HR 96 | Temp 98.6°F | Wt 279.1 lb

## 2014-07-09 DIAGNOSIS — J302 Other seasonal allergic rhinitis: Secondary | ICD-10-CM | POA: Diagnosis not present

## 2014-07-09 DIAGNOSIS — IMO0002 Reserved for concepts with insufficient information to code with codable children: Secondary | ICD-10-CM

## 2014-07-09 DIAGNOSIS — E1165 Type 2 diabetes mellitus with hyperglycemia: Secondary | ICD-10-CM

## 2014-07-09 MED ORDER — HYPROMELLOSE 0.5 % OP SOLN
1.0000 [drp] | Freq: Three times a day (TID) | OPHTHALMIC | Status: DC | PRN
Start: 2014-07-09 — End: 2014-07-09

## 2014-07-09 MED ORDER — HYPROMELLOSE 0.3 % OP SOLN: 1.0000 [drp] | Freq: Three times a day (TID) | OPHTHALMIC | Status: DC | PRN

## 2014-07-09 MED ORDER — LORATADINE 10 MG PO TABS: 10.0000 mg | ORAL_TABLET | Freq: Two times a day (BID) | ORAL | Status: DC

## 2014-07-09 NOTE — Progress Notes (Signed)
   Subjective:    Patient ID: Ana Long, female    DOB: 1975/12/05, 39 y.o.   MRN: [id number]  HPI Ana Long is a 39 yo woman with a history of asthma, seasonal allergies, DM2, PCOS, OSA and OHS who presents for evaluation of a sore throat. She called for the appointment the yesterday, and by the time of this appointment, her sore throat has resolved completely. When it was bothering her, she experienced some pain sharp on swallowing. She denies cough, congestion or shortness of breath. However, she also has been experiencing swelling around her eyes, itching in her eyes, sensitivity of her tongue and some tenderness of her inner lips. Her loratidine prescriptions was recently changed from 10 mg BID to 10 mg daily and she believes that some of her symptoms might be controlled on her previous, higher dose.   Review of Systems  Constitutional: Negative for fever, chills, activity change and appetite change.  HENT: Positive for facial swelling and sore throat. Negative for congestion, ear discharge, ear pain and sneezing.   Eyes: Positive for itching. Negative for pain, discharge, redness and visual disturbance.  Respiratory: Negative for shortness of breath and wheezing.   Cardiovascular: Negative.   Gastrointestinal: Negative.   Genitourinary: Negative.   Musculoskeletal: Negative.   Skin: Negative.   Neurological: Negative.   Psychiatric/Behavioral: Negative.        Objective:   Physical Exam  Constitutional: She is oriented to person, place, and time. She appears well-developed and well-nourished. No distress.  HENT:  Head: Normocephalic and atraumatic.  Right Ear: External ear normal.  Left Ear: External ear normal.  Nose: Nose normal.  Mouth/Throat: Oropharynx is clear and moist. No oropharyngeal exudate.  Eyes: Conjunctivae and EOM are normal. Pupils are equal, round, and reactive to light.  There is periorbital edema, equal bilaterally  Neck: Normal range  of motion. Neck supple.  Cardiovascular: Normal rate, regular rhythm and normal heart sounds.   No murmur heard. Pulmonary/Chest: Effort normal and breath sounds normal. No respiratory distress. She has no wheezes.  Abdominal: Soft. Bowel sounds are normal. There is no tenderness.  Musculoskeletal: Normal range of motion. She exhibits no edema.  Neurological: She is alert and oriented to person, place, and time.  Skin: Skin is warm and dry. No rash noted. She is not diaphoretic.  Psychiatric: She has a normal mood and affect.  Vitals reviewed.         Assessment & Plan:   Please see problem oriented charting for assessment & plan  Venita Lick, MD

## 2014-07-09 NOTE — Patient Instructions (Signed)
Resume the full dose of loratidine (10 mg twice per day); this should be corrected by your pharmacy for the 60 pills per month.  Try one drop of the eye solution in each eye when you are experiencing dryness (itching, pain or tearing). This may provide relief when your eyes are affected by your seasonal allergies.  Keep up the great work on your diet and lifestyle changes - your A1c will be rechecked in June!  General Instructions:   Please bring your medicines with you each time you come to clinic.  Medicines may include prescription medications, over-the-counter medications, herbal remedies, eye drops, vitamins, or other pills.   Progress Toward Treatment Goals:  Treatment Goal 02/06/2014  Hemoglobin A1C improved    Self Care Goals & Plans:  Self Care Goal 07/09/2014  Manage my medications take my medicines as prescribed; bring my medications to every visit; refill my medications on time  Monitor my health keep track of my blood glucose; bring my glucose meter and log to each visit; keep track of my blood pressure  Eat healthy foods eat more vegetables; eat foods that are low in salt; eat baked foods instead of fried foods  Be physically active find an activity I enjoy  Prevent falls wear appropriate shoes; have my vision checked  Meeting treatment goals -    Home Blood Glucose Monitoring 02/06/2014  Check my blood sugar 3 times a day  When to check my blood sugar -     Care Management & Community Referrals:  Referral 02/06/2014  Referrals made for care management support diabetes educator  Referrals made to community resources -

## 2014-07-11 NOTE — Progress Notes (Signed)
Internal Medicine Clinic Attending  Case discussed with Dr. Mallory at the time of the visit.  We reviewed the resident's history and exam and pertinent patient test results.  I agree with the assessment, diagnosis, and plan of care documented in the resident's note. 

## 2014-07-11 NOTE — Assessment & Plan Note (Addendum)
Patient presented with symptoms consistent with seasonal allergies in her periorbital swelling, ocular pruritis and tongue sensitivity. Per Dr. Dois Davenport notes, she has requested Claritin 10 mg BID in the past, and has been given this prescription. Per the patient, the prescription changed to 10 mg daily. She does not report any adverse drug reactions to this medication. Zyrtec was again suggested, but patient would like to continue with Claritin. - Claritin 10 mg BID - Given prescription for normal saline eye drops for pruritis and taught how to properly administer into both eyes - Will allow PCP to resume discussions about switching allergy medication; patient has appointment next month

## 2014-07-13 ENCOUNTER — Encounter (HOSPITAL_COMMUNITY): Payer: Self-pay | Admitting: *Deleted

## 2014-07-13 ENCOUNTER — Inpatient Hospital Stay (HOSPITAL_COMMUNITY)
Admission: AD | Admit: 2014-07-13 | Discharge: 2014-07-13 | Disposition: A | Discharge status: Home / Self Care | Payer: Medicaid Other | Source: Ambulatory Visit | Attending: Family Medicine | Admitting: Family Medicine

## 2014-07-13 DIAGNOSIS — B373 Candidiasis of vulva and vagina: Secondary | ICD-10-CM | POA: Insufficient documentation

## 2014-07-13 DIAGNOSIS — Z79899 Other long term (current) drug therapy: Secondary | ICD-10-CM | POA: Insufficient documentation

## 2014-07-13 DIAGNOSIS — Z87891 Personal history of nicotine dependence: Secondary | ICD-10-CM | POA: Insufficient documentation

## 2014-07-13 DIAGNOSIS — Z113 Encounter for screening for infections with a predominantly sexual mode of transmission: Secondary | ICD-10-CM

## 2014-07-13 DIAGNOSIS — N898 Other specified noninflammatory disorders of vagina: Secondary | ICD-10-CM | POA: Diagnosis present

## 2014-07-13 DIAGNOSIS — B3731 Acute candidiasis of vulva and vagina: Secondary | ICD-10-CM

## 2014-07-13 LAB — URINALYSIS, ROUTINE W REFLEX MICROSCOPIC
Bilirubin Urine: NEGATIVE
Glucose, UA: NEGATIVE mg/dL
Hgb urine dipstick: NEGATIVE
Ketones, ur: NEGATIVE mg/dL
Leukocytes, UA: NEGATIVE
Nitrite: NEGATIVE
Protein, ur: NEGATIVE mg/dL
Specific Gravity, Urine: 1.02 (ref 1.005–1.030)
Urobilinogen, UA: 1 mg/dL (ref 0.0–1.0)
pH: 6.5 (ref 5.0–8.0)

## 2014-07-13 LAB — POCT PREGNANCY, URINE: Preg Test, Ur: NEGATIVE

## 2014-07-13 LAB — WET PREP, GENITAL: TRICH WET PREP: NONE SEEN

## 2014-07-13 LAB — RPR: RPR: NONREACTIVE

## 2014-07-13 LAB — HIV ANTIBODY (ROUTINE TESTING W REFLEX): HIV SCREEN 4TH GENERATION: NONREACTIVE

## 2014-07-13 MED ORDER — TERCONAZOLE 0.4 % VA CREA: 1.0000 | TOPICAL_CREAM | Freq: Every day | VAGINAL | Status: DC

## 2014-07-13 NOTE — Discharge Instructions (Signed)
Candida Infection A Candida infection (also called yeast, fungus, and Monilia infection) is an overgrowth of yeast that can occur anywhere on the body. A yeast infection commonly occurs in warm, moist body areas. Usually, the infection remains localized but can spread to become a systemic infection. A yeast infection may be a sign of a more severe disease such as diabetes, leukemia, or AIDS. A yeast infection can occur in both men and women. In women, Candida vaginitis is a vaginal infection. It is one of the most common causes of vaginitis. Men usually do not have symptoms or know they have an infection until other problems develop. Men may find out they have a yeast infection because their sex partner has a yeast infection. Uncircumcised men are more likely to get a yeast infection than circumcised men. This is because the uncircumcised glans is not exposed to air and does not remain as dry as that of a circumcised glans. Older adults may develop yeast infections around dentures. CAUSES  Women  Antibiotics.  Steroid medication taken for a long time.  Being overweight (obese).  Diabetes.  Poor immune condition.  Certain serious medical conditions.  Immune suppressive medications for organ transplant patients.  Chemotherapy.  Pregnancy.  Menstruation.  Stress and fatigue.  Intravenous drug use.  Oral contraceptives.  Wearing tight-fitting clothes in the crotch area.  Catching it from a sex partner who has a yeast infection.  Spermicide.  Intravenous, urinary, or other catheters. Men  Catching it from a sex partner who has a yeast infection.  Having oral or anal sex with a person who has the infection.  Spermicide.  Diabetes.  Antibiotics.  Poor immune system.  Medications that suppress the immune system.  Intravenous drug use.  Intravenous, urinary, or other catheters. SYMPTOMS  Women  Thick, white vaginal discharge.  Vaginal itching.  Redness and  swelling in and around the vagina.  Irritation of the lips of the vagina and perineum.  Blisters on the vaginal lips and perineum.  Painful sexual intercourse.  Low blood sugar (hypoglycemia).  Painful urination.  Bladder infections.  Intestinal problems such as constipation, indigestion, bad breath, bloating, increase in gas, diarrhea, or loose stools. Men  Men may develop intestinal problems such as constipation, indigestion, bad breath, bloating, increase in gas, diarrhea, or loose stools.  Dry, cracked skin on the penis with itching or discomfort.  Jock itch.  Dry, flaky skin.  Athlete's foot.  Hypoglycemia. DIAGNOSIS  Women  A history and an exam are performed.  The discharge may be examined under a microscope.  A culture may be taken of the discharge. Men  A history and an exam are performed.  Any discharge from the penis or areas of cracked skin will be looked at under the microscope and cultured.  Stool samples may be cultured. TREATMENT  Women  Vaginal antifungal suppositories and creams.  Medicated creams to decrease irritation and itching on the outside of the vagina.  Warm compresses to the perineal area to decrease swelling and discomfort.  Oral antifungal medications.  Medicated vaginal suppositories or cream for repeated or recurrent infections.  Wash and dry the irritation areas before applying the cream.  Eating yogurt with Lactobacillus may help with prevention and treatment.  Sometimes painting the vagina with gentian violet solution may help if creams and suppositories do not work. Men  Antifungal creams and oral antifungal medications.  Sometimes treatment must continue for 30 days after the symptoms go away to prevent recurrence. HOME CARE INSTRUCTIONS  Women  Use cotton underwear and avoid tight-fitting clothing.  Avoid colored, scented toilet paper and deodorant tampons or pads.  Do not douche.  Keep your diabetes  under control.  Finish all the prescribed medications.  Keep your skin clean and dry.  Consume milk or yogurt with Lactobacillus-active culture regularly. If you get frequent yeast infections and think that is what the infection is, there are over-the-counter medications that you can get. If the infection does not show healing in 3 days, talk to your caregiver.  Tell your sex partner you have a yeast infection. Your partner may need treatment also, especially if your infection does not clear up or recurs. Men  Keep your skin clean and dry.  Keep your diabetes under control.  Finish all prescribed medications.  Tell your sex partner that you have a yeast infection so he or she can be treated if necessary. SEEK MEDICAL CARE IF:   Your symptoms do not clear up or worsen in one week after treatment.  You have an oral temperature above 102 F (38.9 C).  You have trouble swallowing or eating for a prolonged time.  You develop blisters on and around your vagina.  You develop vaginal bleeding and it is not your menstrual period.  You develop abdominal pain.  You develop intestinal problems as mentioned above.  You get weak or light-headed.  You have painful or increased urination.  You have pain during sexual intercourse. MAKE SURE YOU:   Understand these instructions.  Will watch your condition.  Will get help right away if you are not doing well or get worse. Document Released: 03/18/2004 Document Revised: 06/25/2013 Document Reviewed: 06/30/2009 [facility] Patient Information 2015 [place], [place]. This information is not intended to replace advice given to you by your health care provider. Make sure you discuss any questions you have with your health care provider.  Candidal Vulvovaginitis Candidal vulvovaginitis is an infection of the vagina and vulva. The vulva is the skin around the opening of the vagina. This may cause itching and discomfort in and around the vagina.    HOME CARE  Only take medicine as told by your doctor.  Do not have sex (intercourse) until the infection is healed or as told by your doctor.  Practice safe sex.  Tell your sex partner about your infection.  Do not douche or use tampons.  Wear cotton underwear. Do not wear tight pants or panty hose.  Eat yogurt. This may help treat and prevent yeast infections. GET HELP RIGHT AWAY IF:   You have a fever.  Your problems get worse during treatment or do not get better in 3 days.  You have discomfort, irritation, or itching in your vagina or vulva area.  You have pain after sex.  You start to get belly (abdominal) pain. MAKE SURE YOU:  Understand these instructions.  Will watch your condition.  Will get help right away if you are not doing well or get worse. Document Released: 05/07/2008 Document Revised: 02/13/2013 Document Reviewed: 05/07/2008 [facility] Patient Information 2015 [place], [place]. This information is not intended to replace advice given to you by your health care provider. Make sure you discuss any questions you have with your health care provider.

## 2014-07-13 NOTE — MAU Note (Signed)
Patient states that she has been having some vaginal itching and redness as well as a new sore in her mouth and states she is concerned because she has a new sexual partner and wants "to be checked out."

## 2014-07-13 NOTE — MAU Provider Note (Signed)
History     CSN: [id number]  Arrival date and time: 07/13/14 7672   First Provider Initiated Contact with Patient 07/13/14 0155      Chief Complaint  Patient presents with  . Vaginal Itching   HPI Ana Long 39 y.o. G0P0 nonpregnant female presents to MAU complaining of vaginal irritation.  It has been on and off x 3-4 days.  Just irritation - no discharge noted until obtaining urine sample here.  Sore in mouth - checked out by doctor a couple days ago after having a couple weeks of mouth issues.  She used Diflucan yesterday.     OB History    Gravida Para Term Preterm AB TAB SAB Ectopic Multiple Living   0               Past Medical History  Diagnosis Date  . GERD (gastroesophageal reflux disease)   . Diabetes mellitus     intolerant to Metformin  . Herpes genital   . PCOS (polycystic ovarian syndrome)   . Morbid obesity with BMI of 50 to 60 07/23/2009  . Dry skin     hands   . Asthma     prn inhaler  . Arthritis     major joints  . Complication of anesthesia     states is hard to wake up post-op  . Family history of anesthesia complication     mother went into coma during c-section  . Sleep apnea, obstructive     uses CPAP nightly - had sleep study 08/22/2007  . Carpal tunnel syndrome of right wrist 09/2012  . Obesity hypoventilation syndrome 10/30/2012  . OSA on CPAP      AHI was on  11-17-12 to 120/hr, titrated to 15 cm with 3 cm EPR/   . Seasonal allergies   . Obesity, morbid 01/10/2014    Past Surgical History  Procedure Laterality Date  . Hysteroscopy w/d&c  01-08-2002  . Carpal tunnel release Right 10/02/2012    Procedure: RIGHT CARPAL TUNNEL RELEASE ENDOSCOPIC;  Surgeon: Jolyn Nap, MD;  Location: [place];  Service: Orthopedics;  Laterality: Right;    Family History  Problem Relation Age of Onset  . Other Mother   . Diabetes Maternal Aunt   . Heart attack Maternal Aunt   . Alzheimer's disease Maternal Uncle   .  Cancer Maternal Uncle     prostate  . Diabetes Paternal Aunt   . Cancer Paternal Aunt     brain,mouth  . Alzheimer's disease Paternal Aunt   . Diabetes Paternal Uncle   . Diabetes Maternal Grandmother   . Diabetes Maternal Grandfather   . Leukemia Cousin   . Other Other   . Obesity Other   . Other Other     History  Substance Use Topics  . Smoking status: Former Smoker -- 10 years    Quit date: 05/24/2010  . Smokeless tobacco: Never Used  . Alcohol Use: 0.0 oz/week    0 Standard drinks or equivalent per week     Comment: rarely    Allergies:  Allergies  Allergen Reactions  . Citrus Hives and Itching  . Latex Swelling  . Penicillins Hives and Itching  . Soap Hives, Itching and Swelling    PUREX LAUNDRY DETERGENT  . Tomato Hives and Itching  . Doxycycline Other (See Comments)    AFFECTS JOINTS  . Metformin And Related Rash    GI UPSET    Prescriptions prior to admission  Medication Sig Dispense Refill Last Dose  . ACCU-CHEK FASTCLIX LANCETS MISC Inject 1 each into the skin QID. use for testing blood sugar 2 to 4 times daily. 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 Insulin dependent 102 each 4 07/12/2014 at Unknown time  . acyclovir (ZOVIRAX) 400 MG tablet TAKE ONE TABLET BY MOUTH TWICE DAILY  60 tablet 2 07/12/2014 at Unknown time  . albuterol (VENTOLIN HFA) 108 (90 BASE) MCG/ACT inhaler Inhale 1-2 puffs into the lungs every 4 (four) hours as needed for wheezing or shortness of breath. 1 Inhaler 5 Past Month at Unknown time  . glipiZIDE (GLUCOTROL) 5 MG tablet TAKE ONE TABLET BY MOUTH TWICE DAILY BEFORE MEALS  60 tablet 1 07/12/2014 at Unknown time  . glucose blood (ACCU-CHEK SMARTVIEW) test strip Use to test blood glucose up to 3 times daily.   Dx:E11.65 100 each 12 07/12/2014 at Unknown time  . Hypromellose 0.3 % SOLN Apply 1 drop to eye 3 (three) times daily as needed. 15 mL 0 Past Month at Unknown time  . Insulin Glargine (LANTUS SOLOSTAR) 100 UNIT/ML Solostar Pen Inject 50 Units into the  skin 2 (two) times daily. 15 mL 11 Past Week at Unknown time  . Insulin Pen Needle 31G X 5 MM MISC Use to inject insulin into the skin 2 times daily. 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 Insulin dependent 100 each 6 07/12/2014 at Unknown time  . Insulin Syringe-Needle U-100 (INSULIN SYRINGE 1CC/31GX5/16") 31G X 5/16" 1 ML MISC 1 Units by Does not apply route 3 (three) times daily before meals. 100 each 11 07/12/2014 at Unknown time  . loratadine (CLARITIN) 10 MG tablet Take 1 tablet (10 mg total) by mouth 2 (two) times daily. 60 tablet 1 07/12/2014 at Unknown time  . naproxen sodium (ANAPROX) 220 MG tablet Take 220 mg by mouth at bedtime as needed (for pain).    Past Month at Unknown time  . Probiotic Product (PROBIOTIC DAILY PO) Take 2 capsules by mouth every evening.    07/12/2014 at Unknown time  . ranitidine (ZANTAC) 150 MG tablet Take 150 mg by mouth 2 (two) times daily.   07/12/2014 at Unknown time  . senna (SENOKOT) 8.6 MG TABS tablet Take 2 tablets by mouth at bedtime.   07/12/2014 at Unknown time  . triamcinolone (NASACORT AQ) 55 MCG/ACT AERO nasal inhaler Place 2 sprays into the nose daily. 1 Inhaler 12 07/12/2014 at Unknown time  . chlorpheniramine-HYDROcodone (TUSSIONEX) 10-8 MG/5ML LQCR Take 5 mLs by mouth every 12 (twelve) hours as needed for cough. 115 mL 0 Taking  . fluconazole (DIFLUCAN) 150 MG tablet Take one tablet by mouth once  as needed for yeast infections 2 tablet 5 Taking  . glucosamine-chondroitin 500-400 MG tablet Take 2 tablets by mouth daily.    Taking  . Insulin Syringe-Needle U-100 (INSULIN SYRINGE 1CC/28G) 28G X 1/2" 1 ML MISC Use to administer insulin daily. Dx code: 38.02. Insulin dependent. 100 each 3 Taking    ROS Pertinent ROS in HPI.  All other systems are negative.   Physical Exam   Blood pressure 98/58, pulse 94, temperature 98.1 F (36.7 C), temperature source Oral, resp. rate 16, weight 278 lb 12.8 oz (126.463 kg), last menstrual period 06/16/2014.  Physical Exam   Constitutional: She is oriented to person, place, and time. She appears well-developed and well-nourished. No distress.  HENT:  Head: Normocephalic and atraumatic.  Eyes: EOM are normal.  Neck: Normal range of motion.  Cardiovascular: Normal rate.   Respiratory: Effort normal and breath  sounds normal. No respiratory distress.  GI: Soft. Bowel sounds are normal. She exhibits no distension. There is no tenderness. There is no rebound and no guarding.  Morbidly obese  Genitourinary:  Mod amt of thick white discharge on erythematous vaginal mucosa No CMT No adnexal tenderness appreciated Difficult exam secondary to large abdomen/pannus  Musculoskeletal: Normal range of motion.  Neurological: She is alert and oriented to person, place, and time.  Skin: Skin is warm and dry.  Psychiatric: She has a normal mood and affect.   Results for orders placed or performed during the hospital encounter of 07/13/14 (from the past 24 hour(s))  Urinalysis, Routine w reflex microscopic     Status: None   Collection Time: 07/13/14  1:15 AM  Result Value Ref Range   Color, Urine YELLOW YELLOW   APPearance CLEAR CLEAR   Specific Gravity, Urine 1.020 1.005 - 1.030   pH 6.5 5.0 - 8.0   Glucose, UA NEGATIVE NEGATIVE mg/dL   Hgb urine dipstick NEGATIVE NEGATIVE   Bilirubin Urine NEGATIVE NEGATIVE   Ketones, ur NEGATIVE NEGATIVE mg/dL   Protein, ur NEGATIVE NEGATIVE mg/dL   Urobilinogen, UA 1.0 0.0 - 1.0 mg/dL   Nitrite NEGATIVE NEGATIVE   Leukocytes, UA NEGATIVE NEGATIVE  Pregnancy, urine POC     Status: None   Collection Time: 07/13/14  1:37 AM  Result Value Ref Range   Preg Test, Ur NEGATIVE NEGATIVE  Wet prep, genital     Status: Abnormal   Collection Time: 07/13/14  2:40 AM  Result Value Ref Range   Yeast Wet Prep HPF POC FEW (A) NONE SEEN   Trich, Wet Prep NONE SEEN NONE SEEN   Clue Cells Wet Prep HPF POC FEW (A) NONE SEEN   WBC, Wet Prep HPF POC RARE (A) NONE SEEN    MAU Course   Procedures  MDM Yeast on wet prep   Assessment and Plan  A: vaginal candida STD screening  P: Discharge to home Terazol vag cream F/u with pcp STD tests pending Patient may return to MAU as needed or if her condition were to change or worsen   Paticia Stack 07/13/2014, 1:55 AM

## 2014-07-15 LAB — GC/CHLAMYDIA PROBE AMP (~~LOC~~) NOT AT ARMC
CHLAMYDIA, DNA PROBE: NEGATIVE
Neisseria Gonorrhea: NEGATIVE

## 2014-07-24 ENCOUNTER — Encounter: Payer: Self-pay | Admitting: Internal Medicine

## 2014-07-24 ENCOUNTER — Ambulatory Visit (INDEPENDENT_AMBULATORY_CARE_PROVIDER_SITE_OTHER): Payer: Medicaid Other | Admitting: Internal Medicine

## 2014-07-24 ENCOUNTER — Encounter: Payer: Self-pay | Admitting: Dietician

## 2014-07-24 ENCOUNTER — Ambulatory Visit (INDEPENDENT_AMBULATORY_CARE_PROVIDER_SITE_OTHER): Payer: Medicaid Other | Admitting: Dietician

## 2014-07-24 VITALS — BP 108/62 | HR 73 | Temp 98.9°F | Ht 61.0 in | Wt 278.3 lb

## 2014-07-24 VITALS — Wt 277.3 lb

## 2014-07-24 DIAGNOSIS — E785 Hyperlipidemia, unspecified: Secondary | ICD-10-CM | POA: Diagnosis not present

## 2014-07-24 DIAGNOSIS — K219 Gastro-esophageal reflux disease without esophagitis: Secondary | ICD-10-CM

## 2014-07-24 DIAGNOSIS — Z794 Long term (current) use of insulin: Secondary | ICD-10-CM

## 2014-07-24 DIAGNOSIS — J45909 Unspecified asthma, uncomplicated: Secondary | ICD-10-CM

## 2014-07-24 DIAGNOSIS — Z713 Dietary counseling and surveillance: Secondary | ICD-10-CM | POA: Diagnosis not present

## 2014-07-24 DIAGNOSIS — J302 Other seasonal allergic rhinitis: Secondary | ICD-10-CM

## 2014-07-24 DIAGNOSIS — E119 Type 2 diabetes mellitus without complications: Secondary | ICD-10-CM | POA: Diagnosis not present

## 2014-07-24 DIAGNOSIS — E11329 Type 2 diabetes mellitus with mild nonproliferative diabetic retinopathy without macular edema: Secondary | ICD-10-CM | POA: Diagnosis not present

## 2014-07-24 DIAGNOSIS — E113299 Type 2 diabetes mellitus with mild nonproliferative diabetic retinopathy without macular edema, unspecified eye: Secondary | ICD-10-CM

## 2014-07-24 DIAGNOSIS — E1165 Type 2 diabetes mellitus with hyperglycemia: Secondary | ICD-10-CM

## 2014-07-24 DIAGNOSIS — IMO0002 Reserved for concepts with insufficient information to code with codable children: Secondary | ICD-10-CM

## 2014-07-24 LAB — COMPLETE METABOLIC PANEL WITH GFR
ALT: 11 U/L (ref 0–35)
AST: 12 U/L (ref 0–37)
Albumin: 3.8 g/dL (ref 3.5–5.2)
Alkaline Phosphatase: 29 U/L — ABNORMAL LOW (ref 39–117)
BUN: 11 mg/dL (ref 6–23)
CALCIUM: 8.8 mg/dL (ref 8.4–10.5)
CO2: 25 mEq/L (ref 19–32)
CREATININE: 0.6 mg/dL (ref 0.50–1.10)
Chloride: 106 mEq/L (ref 96–112)
GFR, Est African American: >89 mL/min
GLUCOSE: 116 mg/dL — AB (ref 70–99)
Potassium: 4.1 mEq/L (ref 3.5–5.3)
SODIUM: 139 meq/L (ref 135–145)
TOTAL PROTEIN: 6.5 g/dL (ref 6.0–8.3)
Total Bilirubin: 0.4 mg/dL (ref 0.2–1.2)

## 2014-07-24 LAB — LIPID PANEL
CHOLESTEROL: 148 mg/dL (ref 0–200)
HDL: 41 mg/dL — AB (ref >=46)
LDL Cholesterol: 85 mg/dL (ref 0–99)
Total CHOL/HDL Ratio: 3.6 Ratio
Triglycerides: 112 mg/dL (ref <150)
VLDL: 22 mg/dL (ref 0–40)

## 2014-07-24 LAB — GLUCOSE, CAPILLARY: GLUCOSE-CAPILLARY: 108 mg/dL — AB (ref 65–99)

## 2014-07-24 LAB — POCT GLYCOSYLATED HEMOGLOBIN (HGB A1C): HEMOGLOBIN A1C: 6.6

## 2014-07-24 MED ORDER — ATORVASTATIN CALCIUM 10 MG PO TABS: 10.0000 mg | ORAL_TABLET | Freq: Every day | ORAL | Status: DC

## 2014-07-24 MED ORDER — INSULIN GLARGINE 100 UNIT/ML SOLOSTAR PEN: 30.0000 [IU] | PEN_INJECTOR | Freq: Every day | SUBCUTANEOUS | Status: DC

## 2014-07-24 MED ORDER — GLIPIZIDE 5 MG PO TABS: 5.0000 mg | ORAL_TABLET | Freq: Every day | ORAL | Status: DC

## 2014-07-24 NOTE — Progress Notes (Signed)
Diabetes Self-Management Education  Visit Type:  Follow-up  Appt. Start Time: 1115 Appt. End Time: 1152  07/25/2014  Ana Long, identified by name and date of birth, is a 39 y.o. female with a diagnosis of Diabetes:2   .      ASSESSMENT  Weight 277 lb 4.8 oz (125.782 kg), last menstrual period 07/17/2014. Body mass index is 52.42 kg/(m^2).   Ana Long has been following several different diet plans to help her attain 27# she has lost in the past 8 months averaging ~ 3#/month loss. Her diabetes medicine have been cut in half and her a1C has dropped more than 2%. She did not like the [place] and stopped it about 1 month after starting it. She has increase hunger and feelings of hypoglycemia periodically. BLOOD SUGARS:  Does not have her meter today, will have her a1C checked.  MEDICATIONS: has been on topiramate and phentermine for two months  EXERCISE:  More active most days of the week with personal trainer once weekly, water Zumba, personal trainer, tracking steps on her phone- usually 4000steps a day, has not increased her goal as yet.   Subsequent Visit Information:  Since your last visit, have you continued or began the use of a meal plan?: Yes How many days a week are you following a meal plan?: 7 Since your last visit, have you continued or began to exercise on a consistent basis?: Yes How many days per week are you exercising or participating in a physicial activity for more than 20 minutes?: 5 Since your last visit have you continued or begun to take your medications as prescribed?: Yes Since your last visit have you had your blood pressure checked?: Yes Is your most recent blood pressure lower, unchanged, or higher since your last visit?: Lower Since your last visit are you checking your feet?: Yes How many days per week are you checking your feet?: 7 Since your last visit have you experienced any weight changes?: Loss Weight Loss (lbs): 25 Since  your last visit, are you checking your blood glucose at least once a day?: Yes  Psychosocial:     Patient Belief/Attitude about Diabetes: Motivated to manage diabetes Self-care barriers: None Self-management support: Doctor's office, Friends, Family, CDE visits Patient Concerns: Healthy Lifestyle, Glycemic Control, Weight Control Special Needs: None Preferred Learning Style: No preference indicated Learning Readiness: Ready  Complications:   How often do you check your blood sugar?: 1-2 times/day Fasting Blood glucose range (mg/dL): 70-129 Postprandial Blood glucose range (mg/dL): 130-179 Number of hypoglycemic episodes per month: 0 Number of hyperglycemic episodes per week: 0 Have you had a dilated eye exam in the past 12 months?: Yes Have you had a dental exam in the past 12 months?: Yes  Diet Intake:    24-hr recall:  B (830-9 AM): weight loss meds and glipizide, tea, sometimes a cleansing smoothie  Snk (~10 AM) :raw, unsalted nuts or fruits that she has to chew  L (12 PM): salad with chicken  Snk :raw, unsalted nuts or fruits and veggie that she has to chew  D (630-7 PM): baked chicken or fish and vegtables  Snk (varies PM): yogurt and 1/4 cup granola Beverages: propel, water, tea without sugar   Exercise:  Exercise: Moderate (swimming / aerobic walking) Moderate Exercise amount of time (min / week): 150   Individualized Plan for Diabetes Self-Management Training:   Learning Objective:  Patient will have a greater understanding of diabetes self-management.  Patient education plan per assessed  needs and concerns is to attend individual sessions for annual visit, nutrition, medications and exercise    Education Topics Reviewed with Patient Today:    Discussed what to do if she reaches a weight plateau, also planning for weight maintenance.  Identified with patient nutritional and/or medication changes necessary with exercise.    PATIENTS GOALS/Plan (Developed  by the patient):  Health Coping: ask for help with weight loss, blood sugars as needed  Patient Self Evaluation of Goals - Patient rates self as meeting previously set goals:     not applicable  Plan:   Follow up in 6 months  Coordination of care- suggest d/cing glipizide, could have her use prandin as needed for CBGs > 200 or food excursions Expected Outcomes:  Demonstrated interest in learning. Expect positive outcomes Education material provided: Support group flyer If problems or questions, patient to contact team via:  Phone Future DSME appointment: - 6 months

## 2014-07-24 NOTE — Assessment & Plan Note (Signed)
Stable.  She uses OTC H2 blocker.

## 2014-07-24 NOTE — Assessment & Plan Note (Signed)
Stable.  No cough or dyspnea.  She has not had to use her rescue inhaler in several weeks.

## 2014-07-24 NOTE — Assessment & Plan Note (Addendum)
She reports increased rhinorrhea, nasal itching, roof of mouth itching.  She thinks it may be related to new diet (she has increased fruit and vegetable consumption). She would like re-referral to allergist (she was seen years ago). - continue claritin, nasonex - referral for allergy testing

## 2014-07-24 NOTE — Assessment & Plan Note (Addendum)
Now that she is age 39 and has DM will start moderate intensity statin, atorvastatin 108m daily.  Given her young age, she was advised that this medication is contraindicated in pregnancy and to inform me if there are plans for pregnancy so the med can be d/c.

## 2014-07-24 NOTE — Assessment & Plan Note (Addendum)
Lab Results  Component Value Date   HGBA1C 6.6 07/24/2014   HGBA1C 7.7 02/06/2014   HGBA1C 9.4 11/07/2013     Assessment: Diabetes control:  well controlled Progress toward A1C goal:   at goal Comments: She lost her meter in her home sometime between last night and today so we do not have a meter to download.  She has not been taking insulin on "detox" weeks (during which she eats fruit/veg smoothie, fruits, veg, nuts, egg, baked fishh/chicken veg).  "detox" occurs for about 10 days at a time twice per month.  When she is not detoxing she watches portion sizes.  She reports taking an appetite suppressant for the past two months prescribed by doctor at behavioral health.  She is not taking insulin on detox days because she was getting low 40-60s during detox weeks. She initially tried to decreased from 50 units to 40 units but was still low.  She is still taking 50 units on non-detox days and reports AM CBGs around 70s-80s.      Plan: Medications:  continue current medications:  DECREASE glipizide from 28m BID to 553mdaily (in the AM); DECREASE insulin to 30 units qHS; ok to skip insulin on "detox" weeks. Home glucose monitoring:  2-3x daily Frequency:   Timing:   Instruction/counseling given: reminded to bring blood glucose meter & log to each visit Educational resources provided: brochure (denies) Self management tools provided:  (lost meter) Other plans: She will decrease insulin by 2 units at a time if she is running low.  RTC in 1 month for follow-up.

## 2014-07-24 NOTE — Assessment & Plan Note (Signed)
She says she was seen by retinal specialist, Dr. Posey Pronto.  She reports a reassuring visit and says he will see her back in 6 months.  I have asked her to have him send me records.

## 2014-07-24 NOTE — Patient Instructions (Signed)
1. Please decrease your insulin to 30 units every night.  If your morning blood sugar is below 80 or you feel like your blood sugar is low, you can decrease your nightly insulin by 2 units at a time.  Decrease your glipizide to 69m once every morning.  I will refer you for allergy testing.   2. Please take all medications as prescribed.    3. If you have worsening of your symptoms or new symptoms arise, please call the clinic (([phone] or go to the ER immediately if symptoms are severe.  Please come back to see me in 1 month.

## 2014-07-24 NOTE — Progress Notes (Signed)
Subjective:    Patient ID: Ana Long, female    DOB: 1975-09-03, 39 y.o.   MRN: [id number]  HPI Comments: Ana Long Born is a 39 year old woman with PMH as below here for follow-up of her chronic conditions.  Please see problem based charting for A&P.    Past Medical History  Diagnosis Date  . GERD (gastroesophageal reflux disease)   . Diabetes mellitus     intolerant to Metformin  . Herpes genital   . PCOS (polycystic ovarian syndrome)   . Morbid obesity with BMI of 50 to 60 07/23/2009  . Dry skin     hands   . Asthma     prn inhaler  . Arthritis     major joints  . Complication of anesthesia     states is hard to wake up post-op  . Family history of anesthesia complication     mother went into coma during c-section  . Sleep apnea, obstructive     uses CPAP nightly - had sleep study 08/22/2007  . Carpal tunnel syndrome of right wrist 09/2012  . Obesity hypoventilation syndrome 10/30/2012  . OSA on CPAP      AHI was on  11-17-12 to 120/hr, titrated to 15 cm with 3 cm EPR/   . Seasonal allergies   . Obesity, morbid 01/10/2014   Current Outpatient Prescriptions on File Prior to Visit  Medication Sig Dispense Refill  . ACCU-CHEK FASTCLIX LANCETS MISC Inject 1 each into the skin QID. use for testing blood sugar 2 to 4 times daily. 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 Insulin dependent 102 each 4  . acyclovir (ZOVIRAX) 400 MG tablet TAKE ONE TABLET BY MOUTH TWICE DAILY  60 tablet 2  . albuterol (VENTOLIN HFA) 108 (90 BASE) MCG/ACT inhaler Inhale 1-2 puffs into the lungs every 4 (four) hours as needed for wheezing or shortness of breath. 1 Inhaler 5  . chlorpheniramine-HYDROcodone (TUSSIONEX) 10-8 MG/5ML LQCR Take 5 mLs by mouth every 12 (twelve) hours as needed for cough. 115 mL 0  . glipiZIDE (GLUCOTROL) 5 MG tablet TAKE ONE TABLET BY MOUTH TWICE DAILY BEFORE MEALS  60 tablet 1  . glucosamine-chondroitin 500-400 MG tablet Take 2 tablets by mouth daily.     Marland Kitchen glucose blood (ACCU-CHEK  SMARTVIEW) test strip Use to test blood glucose up to 3 times daily.   Dx:E11.65 100 each 12  . Hypromellose 0.3 % SOLN Apply 1 drop to eye 3 (three) times daily as needed. 15 mL 0  . Insulin Glargine (LANTUS SOLOSTAR) 100 UNIT/ML Solostar Pen Inject 50 Units into the skin 2 (two) times daily. 15 mL 11  . Insulin Pen Needle 31G X 5 MM MISC Use to inject insulin into the skin 2 times daily. 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 Insulin dependent 100 each 6  . Insulin Syringe-Needle U-100 (INSULIN SYRINGE 1CC/28G) 28G X 1/2" 1 ML MISC Use to administer insulin daily. Dx code: 53.02. Insulin dependent. 100 each 3  . Insulin Syringe-Needle U-100 (INSULIN SYRINGE 1CC/31GX5/16") 31G X 5/16" 1 ML MISC 1 Units by Does not apply route 3 (three) times daily before meals. 100 each 11  . loratadine (CLARITIN) 10 MG tablet Take 1 tablet (10 mg total) by mouth 2 (two) times daily. 60 tablet 1  . naproxen sodium (ANAPROX) 220 MG tablet Take 220 mg by mouth at bedtime as needed (for pain).     . Probiotic Product (PROBIOTIC DAILY PO) Take 2 capsules by mouth every evening.     Marland Kitchen  ranitidine (ZANTAC) 150 MG tablet Take 150 mg by mouth 2 (two) times daily.    Marland Kitchen senna (SENOKOT) 8.6 MG TABS tablet Take 2 tablets by mouth at bedtime.    Marland Kitchen terconazole (TERAZOL 7) 0.4 % vaginal cream Place 1 applicator vaginally at bedtime. Apply to external surface as well. 45 g 0  . triamcinolone (NASACORT AQ) 55 MCG/ACT AERO nasal inhaler Place 2 sprays into the nose daily. 1 Inhaler 12   No current facility-administered medications on file prior to visit.   Review of Systems  Constitutional: Negative for fever, chills and appetite change.  Respiratory: Negative for cough and shortness of breath.   Cardiovascular: Negative for chest pain, palpitations and leg swelling.  Endocrine: Negative for polydipsia and polyuria.  Genitourinary: Negative for dysuria and hematuria.       Filed Vitals:   07/24/14 1435  BP: 108/62  Pulse: 73  Temp: 98.9 F  (37.2 C)  TempSrc: Oral  Height: 5' 1"  (1.549 m)  Weight: 278 lb 4.8 oz (126.236 kg)  SpO2: 99%   Objective:   Physical Exam  Constitutional: She is oriented to person, place, and time. She appears well-developed. No distress.  HENT:  Head: Normocephalic and atraumatic.  Mouth/Throat: Oropharynx is clear and moist. No oropharyngeal exudate.  Eyes: EOM are normal. Pupils are equal, round, and reactive to light.  Neck: Neck supple.  Cardiovascular: Normal rate, regular rhythm and normal heart sounds.  Exam reveals no gallop and no friction rub.   No murmur heard. Pulmonary/Chest: Effort normal and breath sounds normal. No respiratory distress. She has no wheezes. She has no rales.  Abdominal: Soft. Bowel sounds are normal. She exhibits no distension and no mass. There is no tenderness. There is no rebound and no guarding.  Musculoskeletal: Normal range of motion. She exhibits edema. She exhibits no tenderness.  1+ B/L LE edema  Neurological: She is alert and oriented to person, place, and time. No cranial nerve deficit.  Skin: Skin is warm. She is not diaphoretic.  Psychiatric: She has a normal mood and affect. Her behavior is normal. Judgment and thought content normal.  Vitals reviewed.         Assessment & Plan:  Please see problem based charting for A&P.

## 2014-07-26 NOTE — Progress Notes (Signed)
Internal Medicine Clinic Attending  Case discussed with Dr. Wilson soon after the resident saw the patient.  We reviewed the resident's history and exam and pertinent patient test results.  I agree with the assessment, diagnosis, and plan of care documented in the resident's note.  

## 2014-08-02 ENCOUNTER — Ambulatory Visit (INDEPENDENT_AMBULATORY_CARE_PROVIDER_SITE_OTHER): Payer: Medicaid Other | Admitting: Obstetrics & Gynecology

## 2014-08-02 ENCOUNTER — Encounter: Payer: Self-pay | Admitting: Obstetrics & Gynecology

## 2014-08-02 VITALS — BP 123/64 | HR 69 | Temp 98.6°F | Ht 62.0 in | Wt 276.4 lb

## 2014-08-02 DIAGNOSIS — B009 Herpesviral infection, unspecified: Secondary | ICD-10-CM | POA: Diagnosis not present

## 2014-08-02 DIAGNOSIS — N760 Acute vaginitis: Secondary | ICD-10-CM | POA: Diagnosis not present

## 2014-08-02 DIAGNOSIS — R87810 Cervical high risk human papillomavirus (HPV) DNA test positive: Secondary | ICD-10-CM | POA: Diagnosis present

## 2014-08-02 MED ORDER — ACYCLOVIR 400 MG PO TABS: 400.0000 mg | ORAL_TABLET | Freq: Two times a day (BID) | ORAL | Status: DC

## 2014-08-02 NOTE — Patient Instructions (Addendum)
Return to clinic for any scheduled appointments or for any gynecologic concerns as needed.   If any outbreak, increase Acyclovir to 400 mg three times a day; or 800 mg twice a day for five days, then resume normal 400 mg twice a day for suppression   Human Papillomavirus Human papillomavirus (HPV) is the most common sexually transmitted infection (STI) and is highly contagious. HPV infections cause genital warts and cancers to the outlet of the womb (cervix), birth canal (vagina), opening of the birth canal (vulva), and anus. There are over 100 types of HPV. Four types of HPV are responsible for causing 70% of all cervical cancers. Ninety percent of anal cancers and genital warts are caused by HPV. Unless you have wart-like lesions in the throat or genital warts that you can see or feel, HPV usually does not cause symptoms. Therefore, people can be infected for long periods and pass it on to others without knowing it. HPV in pregnancy usually does not cause a problem for the mother or baby. If the mother has genital warts, the baby rarely gets infected. When the HPV infection is found to be pre-cancerous on the cervix, vagina, or vulva, the mother will be followed closely during the pregnancy. Any needed treatment will be done after the baby is born. CAUSES   Having unprotected sex. HPV can be spread by oral, vaginal, or anal sexual activity.  Having several sex partners.  Having a sex partner who has other sex partners.  Having or having had another sexually transmitted infection. SYMPTOMS   More than 90% of people carrying HPV cannot tell anything is wrong.  Wart-like lesions in the throat (from having oral sex).  Warts in the infected skin or mucous membranes.  Genital warts may itch, burn, or bleed.  Genital warts may be painful or bleed during sexual intercourse. DIAGNOSIS   Genital warts are easily seen with the naked eye.  Currently, there is no FDA-approved test to detect HPV  in males.  In females, a Pap test can show cells which are infected with HPV.  In females, a scope can be used to view the cervix (colposcopy). A colposcopy can be performed if the pelvic exam or Pap test is abnormal.  In females, a sample of tissue may be removed (biopsy) during the colposcopy. TREATMENT   Treatment of genital warts can include:  Podophyllin lotion or gel.  Bichloroacetic acid (BCA) or trichloroacetic acid (TCA).  Podofilox solution or gel.  Imiquimod cream.  Interferon injections.  Use of a probe to apply extreme cold (cryotherapy).  Application of an intense beam of light (laser treatment).  Use of a probe to apply extreme heat (electrocautery).  Surgery.  HPV of the cervix, vagina, or vulva can be treated with:  Cryotherapy.  Laser treatment.  Electrocautery.  Surgery. Your caregiver will follow you closely after you are treated. This is because the HPV can come back and may need treatment again. HOME CARE INSTRUCTIONS   Follow your caregiver's instructions regarding medications, Pap tests, and follow-up exams.  Do not touch or scratch the warts.  Do not treat genital warts with medication used for treating hand warts.  Tell your sex partner about your infection because he or she may also need treatment.  Do not have sex while you are being treated.  After treatment, use condoms during sex to prevent future infections.  Have only 1 sex partner.  Have a sex partner who does not have other sex partners.  Use  over-the-counter creams for itching or irritation as directed by your caregiver.  Use over-the-counter or prescription medicines for pain, discomfort, or fever as directed by your caregiver.  Do not douche or use tampons during treatment of HPV. PREVENTION   Talk to your caregiver about getting the HPV vaccines. These vaccines prevent some HPV infections and cancers. It is recommended that the vaccine be given to males and females  between the age of 78 and 52 years old. It will not work if you already have HPV and it is not recommended for pregnant women. The vaccines are not recommended for pregnant women.  Call your caregiver if you think you are pregnant and have the HPV.  A PAP test is done to screen for cervical cancer.  The first PAP test should be done at age 64.  Between ages 50 and 43, PAP tests are repeated every 2 years.  Beginning at age 37, you are advised to have a PAP test every 3 years as long as your past 3 PAP tests have been normal.  Some women have medical problems that increase the chance of getting cervical cancer. Talk to your caregiver about these problems. It is especially important to talk to your caregiver if a new problem develops soon after your last PAP test. In these cases, your caregiver may recommend more frequent screening and Pap tests.  The above recommendations are the same for women who have or have not gotten the vaccine for HPV (Human Papillomavirus).  If you had a hysterectomy for a problem that was not a cancer or a condition that could lead to cancer, then you no longer need Pap tests. However, even if you no longer need a PAP test, a regular exam is a good idea to make sure no other problems are starting.   If you are between ages 29 and 39, and you have had normal Pap tests going back 10 years, you no longer need Pap tests. However, even if you no longer need a PAP test, a regular exam is a good idea to make sure no other problems are starting.  If you have had past treatment for cervical cancer or a condition that could lead to cancer, you need Pap tests and screening for cancer for at least 20 years after your treatment.  If Pap tests have been discontinued, risk factors (such as a new sexual partner)need to be re-assessed to determine if screening should be resumed.  Some women may need screenings more often if they are at high risk for cervical cancer. SEEK MEDICAL  CARE IF:   The treated skin becomes red, swollen or painful.  You have an oral temperature above 102 F (38.9 C).  You feel generally ill.  You feel lumps or pimple-like projections in and around your genital area.  You develop bleeding of the vagina or the treatment area.  You develop painful sexual intercourse. Document Released: 05/01/2003 Document Revised: 05/03/2011 Document Reviewed: 05/16/2013 [facility] Patient Information 2015 [place], [place]. This information is not intended to replace advice given to you by your health care provider. Make sure you discuss any questions you have with your health care provider.

## 2014-08-02 NOTE — Progress Notes (Signed)
CLINIC ENCOUNTER NOTE  History:  39 y.o. G0P0 here today for dyspareunia x 1 month. Occasionally feels she is not adequately lubricated during intercourse; also discomfort varies depending on position. Pain occurs on the inside, also when partner comes in contact with her cervix.   She denies any abnormal vaginal discharge, bleeding, pelvic pain outside intercourse or other concerns.   Past Medical History  Diagnosis Date  . GERD (gastroesophageal reflux disease)   . Diabetes mellitus     intolerant to Metformin  . Herpes genital   . PCOS (polycystic ovarian syndrome)   . Morbid obesity with BMI of 50 to 60 07/23/2009  . Dry skin     hands   . Asthma     prn inhaler  . Arthritis     major joints  . Complication of anesthesia     states is hard to wake up post-op  . Family history of anesthesia complication     mother went into coma during c-section  . Sleep apnea, obstructive     uses CPAP nightly - had sleep study 08/22/2007  . Carpal tunnel syndrome of right wrist 09/2012  . Obesity hypoventilation syndrome 10/30/2012  . OSA on CPAP      AHI was on  11-17-12 to 120/hr, titrated to 15 cm with 3 cm EPR/   . Seasonal allergies   . Obesity, morbid 01/10/2014    Past Surgical History  Procedure Laterality Date  . Hysteroscopy w/d&c  01-08-2002  . Carpal tunnel release Right 10/02/2012    Procedure: RIGHT CARPAL TUNNEL RELEASE ENDOSCOPIC;  Surgeon: Jolyn Nap, MD;  Location: [place];  Service: Orthopedics;  Laterality: Right;    The following portions of the patient's history were reviewed and updated as appropriate: allergies, current medications, past family history, past medical history, past social history, past surgical history and problem list.   Health Maintenance:  Normal pap and posittive HRHPV on 04/16/14.  Normal mammogram on 02/18/14.   Review of Systems:  Pertinent items are noted in HPI. Comprehensive review of systems was otherwise  negative.  Objective:  Physical Exam BP 123/64 mmHg  Pulse 69  Temp(Src) 98.6 F (37 C) (Oral)  Ht 5' 2"  (1.575 m)  Wt 276 lb 6.4 oz (125.374 kg)  BMI 50.54 kg/m2  LMP 07/17/2014 CONSTITUTIONAL: Well-developed, well-nourished female in no acute distress.  HENT:  Normocephalic, atraumatic, External right and left ear normal. Oropharynx is clear and moist EYES: Conjunctivae and EOM are normal. Pupils are equal, round, and reactive to light. No scleral icterus.  NECK: Normal range of motion, supple, no masses SKIN: Skin is warm and dry. No rash noted. Not diaphoretic. No erythema. No pallor. [place]: Alert and oriented to person, place, and time. Normal reflexes, muscle tone coordination. No cranial nerve deficit noted. PSYCHIATRIC: Normal mood and affect. Normal behavior. Normal judgment and thought content. CARDIOVASCULAR: Normal heart rate noted RESPIRATORY: Effort and breath sounds normal, no problems with respiration noted ABDOMEN: Soft,obese, no distention noted.   PELVIC: Normal appearing external genitalia; normal appearing vaginal mucosa and cervix.  No lesions noted.  There was very scant discharge overall.  Wet prep obtained  MUSCULOSKELETAL: Normal range of motion. No edema noted.  Assessment & Plan:  Will follow up wet prep and manage accordingly Recommended OTC water-based lubrication Proper vulvar hygiene emphasized: discussed avoidance of perfumed soaps, detergents, lotions and any type of douches; in addition to wearing cotton underwear and no underwear at night.  Also  recommended cleaning front to back, voiding and cleaning up after intercourse.  Advised to try other positions that may not be as uncomfortable Patient advised to call with any worsening symptoms Routine preventative health maintenance measures emphasized; needs to repeat pap smear in after 04/16/2014 given positive HRHPV. Please refer to After Visit Summary for other counseling recommendations     Verita Schneiders, MD, [place] Attending [place] for [place]

## 2014-08-02 NOTE — Addendum Note (Signed)
Addended by: Valarie Merino on: 08/02/2014 11:57 AM   Modules accepted: Orders

## 2014-08-03 LAB — WET PREP, GENITAL
Trich, Wet Prep: NONE SEEN
WBC WET PREP: NONE SEEN

## 2014-08-05 MED ORDER — FLUCONAZOLE 150 MG PO TABS: 150.0000 mg | ORAL_TABLET | Freq: Once | ORAL | Status: DC

## 2014-08-05 MED ORDER — METRONIDAZOLE 500 MG PO TABS: 500.0000 mg | ORAL_TABLET | Freq: Two times a day (BID) | ORAL | Status: DC

## 2014-08-05 NOTE — Addendum Note (Signed)
Addended by: Verita Schneiders A on: 08/05/2014 08:54 AM   Modules accepted: Orders

## 2014-08-20 NOTE — Addendum Note (Signed)
Addended by: Hulan Fray on: 08/20/2014 07:21 PM   Modules accepted: Orders

## 2014-08-27 ENCOUNTER — Ambulatory Visit: Payer: Medicaid Other | Admitting: Internal Medicine

## 2014-09-17 ENCOUNTER — Telehealth: Payer: Self-pay | Admitting: Internal Medicine

## 2014-09-17 NOTE — Telephone Encounter (Signed)
Call to patient to confirm appointment for 09/18/14 at 1:15 no answer

## 2014-09-18 ENCOUNTER — Encounter: Payer: Self-pay | Admitting: Internal Medicine

## 2014-09-18 ENCOUNTER — Ambulatory Visit (INDEPENDENT_AMBULATORY_CARE_PROVIDER_SITE_OTHER): Payer: Medicaid Other | Admitting: Internal Medicine

## 2014-09-18 VITALS — BP 108/64 | HR 89 | Temp 98.3°F | Ht 62.0 in | Wt 273.5 lb

## 2014-09-18 DIAGNOSIS — J302 Other seasonal allergic rhinitis: Secondary | ICD-10-CM

## 2014-09-18 DIAGNOSIS — Z794 Long term (current) use of insulin: Secondary | ICD-10-CM

## 2014-09-18 DIAGNOSIS — E119 Type 2 diabetes mellitus without complications: Secondary | ICD-10-CM | POA: Diagnosis not present

## 2014-09-18 MED ORDER — INSULIN GLARGINE 100 UNIT/ML SOLOSTAR PEN: 40.0000 [IU] | PEN_INJECTOR | Freq: Every day | SUBCUTANEOUS | Status: DC

## 2014-09-18 NOTE — Progress Notes (Addendum)
Subjective:    Patient ID: Ana Long, female    DOB: Feb 02, 1976, 39 y.o.   MRN: [id number]  HPI Comments: Ana Long Born is a 39 year old woman with PMH as below here for follow-up of her DM. Please see problem based charting for A&P.    Past Medical History  Diagnosis Date  . GERD (gastroesophageal reflux disease)   . Diabetes mellitus     intolerant to Metformin  . Herpes genital   . PCOS (polycystic ovarian syndrome)   . Morbid obesity with BMI of 50 to 60 07/23/2009  . Dry skin     hands   . Asthma     prn inhaler  . Arthritis     major joints  . Complication of anesthesia     states is hard to wake up post-op  . Family history of anesthesia complication     mother went into coma during c-section  . Sleep apnea, obstructive     uses CPAP nightly - had sleep study 08/22/2007  . Carpal tunnel syndrome of right wrist 09/2012  . Obesity hypoventilation syndrome 10/30/2012  . OSA on CPAP      AHI was on  11-17-12 to 120/hr, titrated to 15 cm with 3 cm EPR/   . Seasonal allergies   . Obesity, morbid 01/10/2014   Current Outpatient Prescriptions on File Prior to Visit  Medication Sig Dispense Refill  . ACCU-CHEK FASTCLIX LANCETS MISC Inject 1 each into the skin QID. use for testing blood sugar 2 to 4 times daily. 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 Insulin dependent 102 each 4  . acyclovir (ZOVIRAX) 400 MG tablet Take 1 tablet (400 mg total) by mouth 2 (two) times daily. 60 tablet 12  . albuterol (VENTOLIN HFA) 108 (90 BASE) MCG/ACT inhaler Inhale 1-2 puffs into the lungs every 4 (four) hours as needed for wheezing or shortness of breath. 1 Inhaler 5  . atorvastatin (LIPITOR) 10 MG tablet Take 1 tablet (10 mg total) by mouth daily. 30 tablet 3  . chlorpheniramine-HYDROcodone (TUSSIONEX) 10-8 MG/5ML LQCR Take 5 mLs by mouth every 12 (twelve) hours as needed for cough. 115 mL 0  . fluconazole (DIFLUCAN) 150 MG tablet Take 1 tablet (150 mg total) by mouth once. Can take additional  dose three days later if symptoms persist 1 tablet 3  . glipiZIDE (GLUCOTROL) 5 MG tablet Take 1 tablet (5 mg total) by mouth daily before breakfast. 30 tablet 1  . glucose blood (ACCU-CHEK SMARTVIEW) test strip Use to test blood glucose up to 3 times daily.   Dx:E11.65 100 each 12  . Hypromellose 0.3 % SOLN Apply 1 drop to eye 3 (three) times daily as needed. (Patient not taking: Reported on 07/24/2014) 15 mL 0  . Insulin Glargine (LANTUS SOLOSTAR) 100 UNIT/ML Solostar Pen Inject 30 Units into the skin daily at 10 pm. 15 mL 3  . Insulin Pen Needle 31G X 5 MM MISC Use to inject insulin into the skin 2 times daily. 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 Insulin dependent 100 each 6  . loratadine (CLARITIN) 10 MG tablet Take 1 tablet (10 mg total) by mouth 2 (two) times daily. 60 tablet 1  . metroNIDAZOLE (FLAGYL) 500 MG tablet Take 1 tablet (500 mg total) by mouth 2 (two) times daily. 14 tablet 0  . phentermine (ADIPEX-P) 37.5 MG tablet Take 37.5 mg by mouth 2 times daily at 12 noon and 4 pm. Takes half tablet twice a day    . Probiotic Product (PROBIOTIC  DAILY PO) Take 2 capsules by mouth every evening.     . ranitidine (ZANTAC) 150 MG tablet Take 150 mg by mouth 2 (two) times daily.    Marland Kitchen senna (SENOKOT) 8.6 MG TABS tablet Take 2 tablets by mouth at bedtime.    . topiramate (TOPAMAX) 25 MG tablet Take 25 mg by mouth 2 (two) times daily.    Marland Kitchen triamcinolone (NASACORT AQ) 55 MCG/ACT AERO nasal inhaler Place 2 sprays into the nose daily. 1 Inhaler 12   No current facility-administered medications on file prior to visit.    Review of Systems  Constitutional: Negative for fever, appetite change and unexpected weight change.  Respiratory: Negative for shortness of breath.   Cardiovascular: Negative for chest pain.  Gastrointestinal: Negative for nausea, vomiting and abdominal pain.  Genitourinary: Negative for difficulty urinating.       Filed Vitals:   09/18/14 1327  BP: 108/64  Pulse: 89  Temp: 98.3 F (36.8 C)    TempSrc: Oral  Height: 5' 2"  (1.575 m)  Weight: 273 lb 8 oz (124.059 kg)  SpO2: 99%     Objective:   Physical Exam  Constitutional: She is oriented to person, place, and time. She appears well-developed. No distress.  HENT:  Head: Normocephalic and atraumatic.  Mouth/Throat: Oropharynx is clear and moist. No oropharyngeal exudate.  Eyes: EOM are normal. Pupils are equal, round, and reactive to light.  Neck: Neck supple.  Cardiovascular: Normal rate and regular rhythm.  Exam reveals no gallop and no friction rub.   No murmur heard. Pulmonary/Chest: Effort normal and breath sounds normal. No respiratory distress. She has no wheezes. She has no rales.  Abdominal: Soft. Bowel sounds are normal. She exhibits no distension. There is no tenderness. There is no rebound and no guarding.  Musculoskeletal: Normal range of motion. She exhibits no edema or tenderness.  Neurological: She is alert and oriented to person, place, and time. No cranial nerve deficit.  Skin: Skin is warm. She is not diaphoretic.  Psychiatric: She has a normal mood and affect. Her behavior is normal. Judgment and thought content normal.  Vitals reviewed.         Assessment & Plan:  Please see problem based charting for A&P.

## 2014-09-18 NOTE — Assessment & Plan Note (Addendum)
Lab Results  Component Value Date   HGBA1C 6.6 07/24/2014   HGBA1C 7.7 02/06/2014   HGBA1C 9.4 11/07/2013     Assessment: Diabetes control:  well controlled Progress toward A1C goal:   at goal Comments:  She says CBGs were high (~150) with 30 units of Lantus so she increased back up to 40 units daily for the past month.  On Glipizide 27m daily but she says she wants to try going back up to BID and decreasing insulin back to 30 units.  Logs reviewed for past month; there are 23 values and 100% of values in range; lowest is 80 and highest is 165.  She feels low around 90.  She continues with healthy, balanced diet and exercise.  Weight down another 4 pounds since last visit.  I commended her on excellent DM control.  We discussed option of increasing Glipizide but since she is well controlled on her current regimen and not many lows, avg 126 and feeling good we agreed to leave regimen as is.  I hope to try and avoid increasing her anti-DM meds since this will make it harder for her to lose weight.  I feel that as she continues to lose weight she will require less insulin and less Glipizide.  Her goal fastin CBG will be 100-130 to avoid feeling low.  Plan: Medications:  continue current medications:  CONTINUE Lantus 40 units daily with instruction to increase by 2 units q3 days if fasting CBG < 100 and increase by 2 units q3 days if fasting CBG > 130.  She will call if low < 80 or highs > 200.  CONTINUE glipizide [id number].  Home glucose monitoring:  Frequency:   2-3 x daily Timing:  AM and PM Instruction/counseling given: reminded to bring blood glucose meter & log to each visit Educational resources provided: brochure (denied) Self management tools provided: copy of home glucose meter download Other plans: Continue diet and exercise.  I suspect she will continue to need less insulin but she is perfectly controlled right now.  RTC in 2 months.

## 2014-09-18 NOTE — Patient Instructions (Signed)
1. You are doing a great job managing your diabetes.  Keep up the healthy diet (increase water, avoid sugar-sweetened beverages, lean meat, vegetables) and exercise.  Continue to take 40 units of Lantus daily and 46m glipizide every morning with breakfast.  Please check your blood sugar first thing in the morning before you eat.  Your goal first thing in the morning is a blood sugar of 100 - 130 (because you do not feel well when it is in the 80s and 90s).  Your goal after meals is a blood sugar less than 180.  As you lose weight you may not need as much insulin.  You can decrease your insulin by 2 units every 3 days if you start to notice morning blood sugars less than 100.  You can increase your insulin by 2 units every 3 days you notice morning blood sugars more than 130.  Please call me if your blood sugars are below 80 or above 200 so we can bring you back to clinic sooner.  Otherwise, come back in 2 months.   2. Please take all medications as prescribed.    3. If you have worsening of your symptoms or new symptoms arise, please call the clinic [phone] or go to the ER immediately if symptoms are severe.

## 2014-09-19 LAB — GLUCOSE, CAPILLARY: GLUCOSE-CAPILLARY: 120 mg/dL — AB (ref 65–99)

## 2014-09-19 MED ORDER — GLIPIZIDE 5 MG PO TABS: 5.0000 mg | ORAL_TABLET | Freq: Every day | ORAL | Status: DC

## 2014-09-20 NOTE — Progress Notes (Signed)
Medicine attending: Medical history, presenting problems, physical findings, and medications, reviewed with Dr Duwaine Maxin on the day of the patient visit   and I concur with her evaluation and management plan.

## 2014-09-25 LAB — HM DIABETES EYE EXAM

## 2014-10-01 ENCOUNTER — Encounter: Payer: Self-pay | Admitting: *Deleted

## 2014-11-20 ENCOUNTER — Ambulatory Visit (INDEPENDENT_AMBULATORY_CARE_PROVIDER_SITE_OTHER): Payer: Medicaid Other | Admitting: Internal Medicine

## 2014-11-20 ENCOUNTER — Other Ambulatory Visit: Payer: Self-pay | Admitting: Internal Medicine

## 2014-11-20 ENCOUNTER — Encounter: Payer: Self-pay | Admitting: Internal Medicine

## 2014-11-20 VITALS — BP 120/66 | HR 90 | Temp 98.0°F | Ht 62.0 in | Wt 276.5 lb

## 2014-11-20 DIAGNOSIS — Z9989 Dependence on other enabling machines and devices: Secondary | ICD-10-CM | POA: Diagnosis not present

## 2014-11-20 DIAGNOSIS — E119 Type 2 diabetes mellitus without complications: Secondary | ICD-10-CM

## 2014-11-20 DIAGNOSIS — Z794 Long term (current) use of insulin: Secondary | ICD-10-CM | POA: Diagnosis not present

## 2014-11-20 DIAGNOSIS — G4733 Obstructive sleep apnea (adult) (pediatric): Secondary | ICD-10-CM

## 2014-11-20 DIAGNOSIS — J302 Other seasonal allergic rhinitis: Secondary | ICD-10-CM

## 2014-11-20 DIAGNOSIS — J45909 Unspecified asthma, uncomplicated: Secondary | ICD-10-CM | POA: Diagnosis not present

## 2014-11-20 DIAGNOSIS — K219 Gastro-esophageal reflux disease without esophagitis: Secondary | ICD-10-CM

## 2014-11-20 LAB — POCT GLYCOSYLATED HEMOGLOBIN (HGB A1C): Hemoglobin A1C: 7

## 2014-11-20 LAB — GLUCOSE, CAPILLARY: Glucose-Capillary: 267 mg/dL — ABNORMAL HIGH (ref 65–99)

## 2014-11-20 MED ORDER — INSULIN GLARGINE 100 UNIT/ML SOLOSTAR PEN: 20.0000 [IU] | PEN_INJECTOR | Freq: Every morning | SUBCUTANEOUS | Status: DC

## 2014-11-20 NOTE — Patient Instructions (Addendum)
1. Please DECREASE your Lantus from 40 units to 20 units daily and take it in the morning instead of the evening so you don't fall asleep before taking it.  Keep taking your glipizide every morning as well.     2. Please take all medications as prescribed.   3. If you have worsening of your symptoms or new symptoms arise, please call the clinic ([phone]), or go to the ER immediately if symptoms are severe.   Come back to see me in 1 month with your meter so we can check on your diabetes control.

## 2014-11-20 NOTE — Assessment & Plan Note (Signed)
She tried famotidine because it was on sale but feels the ranitidine works better.  She will resume Zantac.

## 2014-11-20 NOTE — Assessment & Plan Note (Signed)
Stable.  No cough, dyspnea or wheezing.   - will refill prn inhaler

## 2014-11-20 NOTE — Assessment & Plan Note (Addendum)
Lab Results  Component Value Date   HGBA1C 7.0 11/20/2014   HGBA1C 6.6 07/24/2014   HGBA1C 7.7 02/06/2014     Assessment: Diabetes control:  well controlled Progress toward A1C goal:   at goal Comments: She says CBGs have been up and down.  CBG meter download reviewed:  22 test for previous 30 day period; highest 229, lowest 83, average 136.  She feels low around 90 but ok at 100.  She is not taking Lantus on about 4 days out of the week because she says she often falls asleep before remembering to take it.  She is sticking to diet and exercise plan and is eating an apple in clinic.    Plan: Medications:  continue current medications:  DECREASE Lantus from 40 units to 20 units daily and move from qPM to qAM so she does not have to worry about falling asleep before she takes it and will be more likely to take it every day instead of just 3x per week.  Continue glipizide 77m qAM. Home glucose monitoring:   Frequency:  2x daily Timing:   Instruction/counseling given: reminded to bring blood glucose meter & log to each visit Educational resources provided:   Self management tools provided:   Other plans: She will continue to stick with diet/exercise plan.  I am hoping decreased insulin will also help her in attaining continued weight loss which will help with DM control.  RTC in 1 month for follow-up.

## 2014-11-20 NOTE — Assessment & Plan Note (Signed)
Compliant with CPAP but it is time for new supplies.  She will check with [employer] rep for new supplies.

## 2014-11-20 NOTE — Progress Notes (Signed)
Subjective:    Patient ID: Ana Long, female    DOB: Jul 09, 1975, 39 y.o.   MRN: [id number]  HPI Comments: Ana Long is a 39 year old woman with PMH as below here for follow-up of chronic conditions.  Please see problem based charting for status of the chronic medical issues.     Past Medical History  Diagnosis Date  . GERD (gastroesophageal reflux disease)   . Diabetes mellitus     intolerant to Metformin  . Herpes genital   . PCOS (polycystic ovarian syndrome)   . Morbid obesity with BMI of 50 to 60 07/23/2009  . Dry skin     hands   . Asthma     prn inhaler  . Arthritis     major joints  . Complication of anesthesia     states is hard to wake up post-op  . Family history of anesthesia complication     mother went into coma during c-section  . Sleep apnea, obstructive     uses CPAP nightly - had sleep study 08/22/2007  . Carpal tunnel syndrome of right wrist 09/2012  . Obesity hypoventilation syndrome 10/30/2012  . OSA on CPAP      AHI was on  11-17-12 to 120/hr, titrated to 15 cm with 3 cm EPR/   . Seasonal allergies   . Obesity, morbid 01/10/2014   Current Outpatient Prescriptions on File Prior to Visit  Medication Sig Dispense Refill  . ACCU-CHEK FASTCLIX LANCETS MISC Inject 1 each into the skin QID. use for testing blood sugar 2 to 4 times daily. 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 Insulin dependent 102 each 4  . acyclovir (ZOVIRAX) 400 MG tablet Take 1 tablet (400 mg total) by mouth 2 (two) times daily. 60 tablet 12  . albuterol (VENTOLIN HFA) 108 (90 BASE) MCG/ACT inhaler Inhale 1-2 puffs into the lungs every 4 (four) hours as needed for wheezing or shortness of breath. 1 Inhaler 5  . atorvastatin (LIPITOR) 10 MG tablet Take 1 tablet (10 mg total) by mouth daily. 30 tablet 3  . glipiZIDE (GLUCOTROL) 5 MG tablet Take 1 tablet (5 mg total) by mouth daily before breakfast. 90 tablet 1  . Insulin Glargine (LANTUS SOLOSTAR) 100 UNIT/ML Solostar Pen Inject 40 Units into  the skin daily at 10 pm. 15 mL 3  . Insulin Pen Needle 31G X 5 MM MISC Use to inject insulin into the skin 2 times daily. 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 Insulin dependent 100 each 6  . loratadine (CLARITIN) 10 MG tablet Take 1 tablet (10 mg total) by mouth 2 (two) times daily. 60 tablet 1  . phentermine (ADIPEX-P) 37.5 MG tablet Take 37.5 mg by mouth 2 times daily at 12 noon and 4 pm. Takes half tablet twice a day    . Probiotic Product (PROBIOTIC DAILY PO) Take 2 capsules by mouth every evening.     . topiramate (TOPAMAX) 25 MG tablet Take 25 mg by mouth 2 (two) times daily.    . chlorpheniramine-HYDROcodone (TUSSIONEX) 10-8 MG/5ML LQCR Take 5 mLs by mouth every 12 (twelve) hours as needed for cough. 115 mL 0  . glucose blood (ACCU-CHEK SMARTVIEW) test strip Use to test blood glucose up to 3 times daily.   Dx:E11.65 100 each 12  . Hypromellose 0.3 % SOLN Apply 1 drop to eye 3 (three) times daily as needed. (Patient not taking: Reported on 07/24/2014) 15 mL 0  . ranitidine (ZANTAC) 150 MG tablet Take 150 mg by mouth 2 (two)  times daily.    Marland Kitchen senna (SENOKOT) 8.6 MG TABS tablet Take 2 tablets by mouth at bedtime.    . triamcinolone (NASACORT AQ) 55 MCG/ACT AERO nasal inhaler Place 2 sprays into the nose daily. 1 Inhaler 12   No current facility-administered medications on file prior to visit.    Review of Systems  Constitutional: Negative for appetite change and unexpected weight change.  HENT: Negative for rhinorrhea.   Respiratory: Negative for cough, shortness of breath and wheezing.   Cardiovascular: Positive for leg swelling. Negative for chest pain and palpitations.  Gastrointestinal: Negative for vomiting, abdominal pain, diarrhea and constipation.       Heartburn  Genitourinary: Negative for dysuria.  Musculoskeletal:       No knee pain recently  Neurological: Negative for syncope, light-headedness and headaches.       Filed Vitals:   11/20/14 1345  BP: 120/66  Pulse: 90  Temp: 98 F (36.7  C)  TempSrc: Oral  Height: 5' 2"  (1.575 m)  Weight: 276 lb 8 oz (125.42 kg)  SpO2: 100%    Objective:   Physical Exam  Constitutional: She is oriented to person, place, and time. She appears well-developed. No distress.  HENT:  Head: Normocephalic and atraumatic.  Mouth/Throat: Oropharynx is clear and moist. No oropharyngeal exudate.  Eyes: EOM are normal. Pupils are equal, round, and reactive to light. Right eye exhibits no discharge. Left eye exhibits no discharge. No scleral icterus.  Neck: Neck supple.  Cardiovascular: Normal rate, regular rhythm and normal heart sounds.  Exam reveals no gallop and no friction rub.   No murmur heard. Pulmonary/Chest: Effort normal and breath sounds normal. No respiratory distress. She has no wheezes. She has no rales.  Abdominal: Soft. Bowel sounds are normal. She exhibits no distension. There is tenderness. There is no rebound and no guarding.  Mild abd/pelvic TTP (she is currently experiencing menstrual cramping)  Musculoskeletal: Normal range of motion. She exhibits edema. She exhibits no tenderness.  1+ B/L LE edema  Neurological: She is alert and oriented to person, place, and time. No cranial nerve deficit.  Skin: Skin is warm. She is not diaphoretic.  Psychiatric: She has a normal mood and affect. Her behavior is normal. Judgment and thought content normal.  Vitals reviewed.         Assessment & Plan:  Please see problem based charting for assessment and plan.

## 2014-11-20 NOTE — Assessment & Plan Note (Signed)
She has been seen by allergy doctor and symptoms controlled with claritin 87m daily.  She will plan to start using the nasonex again.

## 2014-11-25 ENCOUNTER — Ambulatory Visit: Payer: Medicaid Other | Admitting: Pediatrics

## 2014-12-01 ENCOUNTER — Inpatient Hospital Stay (HOSPITAL_COMMUNITY)
Admission: EM | Admit: 2014-12-01 | Discharge: 2014-12-10 | DRG: 418 | Disposition: A | Discharge status: Home / Self Care | Payer: Medicaid Other | Attending: Surgery | Admitting: Surgery

## 2014-12-01 ENCOUNTER — Encounter (HOSPITAL_COMMUNITY): Payer: Self-pay | Admitting: Nurse Practitioner

## 2014-12-01 DIAGNOSIS — Z833 Family history of diabetes mellitus: Secondary | ICD-10-CM

## 2014-12-01 DIAGNOSIS — Z79899 Other long term (current) drug therapy: Secondary | ICD-10-CM

## 2014-12-01 DIAGNOSIS — Z87891 Personal history of nicotine dependence: Secondary | ICD-10-CM

## 2014-12-01 DIAGNOSIS — Z8249 Family history of ischemic heart disease and other diseases of the circulatory system: Secondary | ICD-10-CM

## 2014-12-01 DIAGNOSIS — E1165 Type 2 diabetes mellitus with hyperglycemia: Secondary | ICD-10-CM

## 2014-12-01 DIAGNOSIS — Z6841 Body Mass Index (BMI) 40.0 and over, adult: Secondary | ICD-10-CM

## 2014-12-01 DIAGNOSIS — J45909 Unspecified asthma, uncomplicated: Secondary | ICD-10-CM | POA: Diagnosis present

## 2014-12-01 DIAGNOSIS — A6000 Herpesviral infection of urogenital system, unspecified: Secondary | ICD-10-CM | POA: Diagnosis present

## 2014-12-01 DIAGNOSIS — R52 Pain, unspecified: Secondary | ICD-10-CM

## 2014-12-01 DIAGNOSIS — M1710 Unilateral primary osteoarthritis, unspecified knee: Secondary | ICD-10-CM | POA: Diagnosis present

## 2014-12-01 DIAGNOSIS — N938 Other specified abnormal uterine and vaginal bleeding: Secondary | ICD-10-CM | POA: Diagnosis present

## 2014-12-01 DIAGNOSIS — E119 Type 2 diabetes mellitus without complications: Secondary | ICD-10-CM | POA: Diagnosis present

## 2014-12-01 DIAGNOSIS — Z880 Allergy status to penicillin: Secondary | ICD-10-CM

## 2014-12-01 DIAGNOSIS — M179 Osteoarthritis of knee, unspecified: Secondary | ICD-10-CM | POA: Diagnosis present

## 2014-12-01 DIAGNOSIS — N179 Acute kidney failure, unspecified: Secondary | ICD-10-CM

## 2014-12-01 DIAGNOSIS — R339 Retention of urine, unspecified: Secondary | ICD-10-CM | POA: Diagnosis not present

## 2014-12-01 DIAGNOSIS — Z820 Family history of epilepsy and other diseases of the nervous system: Secondary | ICD-10-CM

## 2014-12-01 DIAGNOSIS — K219 Gastro-esophageal reflux disease without esophagitis: Secondary | ICD-10-CM | POA: Diagnosis present

## 2014-12-01 DIAGNOSIS — K810 Acute cholecystitis: Secondary | ICD-10-CM

## 2014-12-01 DIAGNOSIS — G4733 Obstructive sleep apnea (adult) (pediatric): Secondary | ICD-10-CM

## 2014-12-01 DIAGNOSIS — T508X5A Adverse effect of diagnostic agents, initial encounter: Secondary | ICD-10-CM

## 2014-12-01 DIAGNOSIS — E785 Hyperlipidemia, unspecified: Secondary | ICD-10-CM | POA: Diagnosis present

## 2014-12-01 DIAGNOSIS — N141 Nephropathy induced by other drugs, medicaments and biological substances: Secondary | ICD-10-CM

## 2014-12-01 DIAGNOSIS — K8000 Calculus of gallbladder with acute cholecystitis without obstruction: Principal | ICD-10-CM | POA: Diagnosis present

## 2014-12-01 DIAGNOSIS — J4520 Mild intermittent asthma, uncomplicated: Secondary | ICD-10-CM

## 2014-12-01 DIAGNOSIS — Z9989 Dependence on other enabling machines and devices: Secondary | ICD-10-CM

## 2014-12-01 DIAGNOSIS — E662 Morbid (severe) obesity with alveolar hypoventilation: Secondary | ICD-10-CM | POA: Diagnosis present

## 2014-12-01 MED ORDER — ONDANSETRON 4 MG PO TBDP
4.0000 mg | ORAL_TABLET | Freq: Once | ORAL | Status: AC | PRN
  Administered 2014-12-01: 4 mg via ORAL
  Filled 2014-12-01: qty 1

## 2014-12-01 NOTE — ED Notes (Signed)
Pt has multiple complaints but somehow narrows it down to abdominal pain which she describes feeling "like a big ball of gas," states last BM was 4 days ago, she has been having n/v and aches everywhere.

## 2014-12-02 ENCOUNTER — Encounter (HOSPITAL_COMMUNITY): Payer: Self-pay | Admitting: Emergency Medicine

## 2014-12-02 ENCOUNTER — Encounter (HOSPITAL_COMMUNITY): Admission: EM | Disposition: A | Discharge status: Home / Self Care | Payer: Self-pay | Source: Home / Self Care

## 2014-12-02 ENCOUNTER — Other Ambulatory Visit: Payer: Self-pay

## 2014-12-02 ENCOUNTER — Emergency Department (HOSPITAL_COMMUNITY): Payer: Medicaid Other

## 2014-12-02 ENCOUNTER — Inpatient Hospital Stay (HOSPITAL_COMMUNITY): Payer: Medicaid Other

## 2014-12-02 ENCOUNTER — Encounter (HOSPITAL_COMMUNITY): Payer: Self-pay | Admitting: Surgery

## 2014-12-02 ENCOUNTER — Inpatient Hospital Stay (HOSPITAL_COMMUNITY): Payer: Medicaid Other | Admitting: Anesthesiology

## 2014-12-02 ENCOUNTER — Ambulatory Visit: Payer: Medicaid Other | Admitting: Pediatrics

## 2014-12-02 DIAGNOSIS — R0600 Dyspnea, unspecified: Secondary | ICD-10-CM | POA: Diagnosis not present

## 2014-12-02 DIAGNOSIS — J45909 Unspecified asthma, uncomplicated: Secondary | ICD-10-CM | POA: Diagnosis present

## 2014-12-02 DIAGNOSIS — Z79899 Other long term (current) drug therapy: Secondary | ICD-10-CM | POA: Diagnosis not present

## 2014-12-02 DIAGNOSIS — Z8249 Family history of ischemic heart disease and other diseases of the circulatory system: Secondary | ICD-10-CM | POA: Diagnosis not present

## 2014-12-02 DIAGNOSIS — K810 Acute cholecystitis: Secondary | ICD-10-CM | POA: Diagnosis not present

## 2014-12-02 DIAGNOSIS — Z880 Allergy status to penicillin: Secondary | ICD-10-CM | POA: Diagnosis not present

## 2014-12-02 DIAGNOSIS — K8000 Calculus of gallbladder with acute cholecystitis without obstruction: Secondary | ICD-10-CM | POA: Diagnosis present

## 2014-12-02 DIAGNOSIS — E662 Morbid (severe) obesity with alveolar hypoventilation: Secondary | ICD-10-CM

## 2014-12-02 DIAGNOSIS — J4520 Mild intermittent asthma, uncomplicated: Secondary | ICD-10-CM

## 2014-12-02 DIAGNOSIS — K219 Gastro-esophageal reflux disease without esophagitis: Secondary | ICD-10-CM | POA: Diagnosis present

## 2014-12-02 DIAGNOSIS — Z833 Family history of diabetes mellitus: Secondary | ICD-10-CM | POA: Diagnosis not present

## 2014-12-02 DIAGNOSIS — G4733 Obstructive sleep apnea (adult) (pediatric): Secondary | ICD-10-CM

## 2014-12-02 DIAGNOSIS — E119 Type 2 diabetes mellitus without complications: Secondary | ICD-10-CM | POA: Diagnosis present

## 2014-12-02 DIAGNOSIS — R339 Retention of urine, unspecified: Secondary | ICD-10-CM | POA: Diagnosis not present

## 2014-12-02 DIAGNOSIS — N179 Acute kidney failure, unspecified: Secondary | ICD-10-CM | POA: Diagnosis not present

## 2014-12-02 DIAGNOSIS — Z87891 Personal history of nicotine dependence: Secondary | ICD-10-CM | POA: Diagnosis not present

## 2014-12-02 DIAGNOSIS — M069 Rheumatoid arthritis, unspecified: Secondary | ICD-10-CM | POA: Insufficient documentation

## 2014-12-02 DIAGNOSIS — Z820 Family history of epilepsy and other diseases of the nervous system: Secondary | ICD-10-CM | POA: Diagnosis not present

## 2014-12-02 DIAGNOSIS — R1011 Right upper quadrant pain: Secondary | ICD-10-CM | POA: Diagnosis present

## 2014-12-02 DIAGNOSIS — Z6841 Body Mass Index (BMI) 40.0 and over, adult: Secondary | ICD-10-CM | POA: Diagnosis not present

## 2014-12-02 HISTORY — PX: CHOLECYSTECTOMY: SHX55

## 2014-12-02 LAB — COMPREHENSIVE METABOLIC PANEL
ALK PHOS: 49 U/L (ref 38–126)
ALT: 14 U/L (ref 14–54)
AST: 12 U/L — AB (ref 15–41)
Albumin: 3.8 g/dL (ref 3.5–5.0)
Anion gap: 10 (ref 5–15)
BILIRUBIN TOTAL: 1.2 mg/dL (ref 0.3–1.2)
BUN: 6 mg/dL (ref 6–20)
CALCIUM: 9.1 mg/dL (ref 8.9–10.3)
CO2: 24 mmol/L (ref 22–32)
Chloride: 101 mmol/L (ref 101–111)
Creatinine, Ser: 0.56 mg/dL (ref 0.44–1.00)
GFR calc Af Amer: >60 mL/min (ref >60)
Glucose, Bld: 209 mg/dL — ABNORMAL HIGH (ref 65–99)
POTASSIUM: 3.8 mmol/L (ref 3.5–5.1)
Sodium: 135 mmol/L (ref 135–145)
TOTAL PROTEIN: 8.3 g/dL — AB (ref 6.5–8.1)

## 2014-12-02 LAB — SURGICAL PCR SCREEN
MRSA, PCR: POSITIVE — AB
STAPHYLOCOCCUS AUREUS: POSITIVE — AB

## 2014-12-02 LAB — PREGNANCY, URINE: Preg Test, Ur: NEGATIVE

## 2014-12-02 LAB — URINALYSIS, ROUTINE W REFLEX MICROSCOPIC
Glucose, UA: NEGATIVE mg/dL
HGB URINE DIPSTICK: NEGATIVE
NITRITE: NEGATIVE
PROTEIN: 30 mg/dL — AB
SPECIFIC GRAVITY, URINE: 1.026 (ref 1.005–1.030)
UROBILINOGEN UA: 1 mg/dL (ref 0.0–1.0)
pH: 6 (ref 5.0–8.0)

## 2014-12-02 LAB — CBC
HCT: 35.9 % — ABNORMAL LOW (ref 36.0–46.0)
HEMATOCRIT: 37.7 % (ref 36.0–46.0)
HEMOGLOBIN: 11.8 g/dL — AB (ref 12.0–15.0)
Hemoglobin: 12.4 g/dL (ref 12.0–15.0)
MCH: 26.4 pg (ref 26.0–34.0)
MCH: 26.6 pg (ref 26.0–34.0)
MCHC: 32.9 g/dL (ref 30.0–36.0)
MCHC: 32.9 g/dL (ref 30.0–36.0)
MCV: 80.2 fL (ref 78.0–100.0)
MCV: 81 fL (ref 78.0–100.0)
PLATELETS: 286 10*3/uL (ref 150–400)
PLATELETS: 345 10*3/uL (ref 150–400)
RBC: 4.43 MIL/uL (ref 3.87–5.11)
RBC: 4.7 MIL/uL (ref 3.87–5.11)
RDW: 14.2 % (ref 11.5–15.5)
RDW: 14 % (ref 11.5–15.5)
WBC: 14.6 10*3/uL — ABNORMAL HIGH (ref 4.0–10.5)
WBC: 15 10*3/uL — AB (ref 4.0–10.5)

## 2014-12-02 LAB — GLUCOSE, CAPILLARY
GLUCOSE-CAPILLARY: 192 mg/dL — AB (ref 65–99)
GLUCOSE-CAPILLARY: 213 mg/dL — AB (ref 65–99)
Glucose-Capillary: 190 mg/dL — ABNORMAL HIGH (ref 65–99)

## 2014-12-02 LAB — CREATININE, SERUM
CREATININE: 0.58 mg/dL (ref 0.44–1.00)
GFR calc Af Amer: >60 mL/min (ref >60)
GFR calc non Af Amer: >60 mL/min (ref >60)

## 2014-12-02 LAB — LIPASE, BLOOD: Lipase: 16 U/L — ABNORMAL LOW (ref 22–51)

## 2014-12-02 LAB — URINE MICROSCOPIC-ADD ON

## 2014-12-02 SURGERY — LAPAROSCOPIC CHOLECYSTECTOMY WITH INTRAOPERATIVE CHOLANGIOGRAM
Anesthesia: General | Site: Abdomen

## 2014-12-02 MED ORDER — SUCCINYLCHOLINE CHLORIDE 20 MG/ML IJ SOLN
INTRAMUSCULAR | Status: DC | PRN
  Administered 2014-12-02: 140 mg via INTRAVENOUS

## 2014-12-02 MED ORDER — VANCOMYCIN HCL 10 G IV SOLR
1500.0000 mg | Freq: Once | INTRAVENOUS | Status: AC
  Administered 2014-12-02: 1500 mg via INTRAVENOUS
  Filled 2014-12-02: qty 1500

## 2014-12-02 MED ORDER — SIMETHICONE 80 MG PO CHEW
40.0000 mg | CHEWABLE_TABLET | Freq: Four times a day (QID) | ORAL | Status: DC | PRN
  Filled 2014-12-02: qty 1

## 2014-12-02 MED ORDER — SUGAMMADEX SODIUM 200 MG/2ML IV SOLN
INTRAVENOUS | Status: DC | PRN
  Administered 2014-12-02: 350 mg via INTRAVENOUS

## 2014-12-02 MED ORDER — MUPIROCIN 2 % EX OINT
1.0000 "application " | TOPICAL_OINTMENT | Freq: Two times a day (BID) | CUTANEOUS | Status: DC
  Administered 2014-12-02 – 2014-12-06 (×9): 1 via NASAL
  Filled 2014-12-02 (×2): qty 22

## 2014-12-02 MED ORDER — DICYCLOMINE HCL 10 MG/ML IM SOLN
20.0000 mg | Freq: Once | INTRAMUSCULAR | Status: AC
  Administered 2014-12-02: 20 mg via INTRAMUSCULAR
  Filled 2014-12-02: qty 2

## 2014-12-02 MED ORDER — HYDROMORPHONE HCL 1 MG/ML IJ SOLN
INTRAMUSCULAR | Status: AC
  Filled 2014-12-02: qty 1

## 2014-12-02 MED ORDER — PROPOFOL 10 MG/ML IV BOLUS
INTRAVENOUS | Status: AC
  Filled 2014-12-02: qty 20

## 2014-12-02 MED ORDER — FENTANYL CITRATE (PF) 100 MCG/2ML IJ SOLN
INTRAMUSCULAR | Status: DC | PRN
  Administered 2014-12-02 (×2): 50 ug via INTRAVENOUS
  Administered 2014-12-02 (×4): 100 ug via INTRAVENOUS

## 2014-12-02 MED ORDER — ONDANSETRON HCL 4 MG/2ML IJ SOLN
INTRAMUSCULAR | Status: DC | PRN
  Administered 2014-12-02: 4 mg via INTRAVENOUS

## 2014-12-02 MED ORDER — METRONIDAZOLE IN NACL 5-0.79 MG/ML-% IV SOLN
500.0000 mg | Freq: Once | INTRAVENOUS | Status: AC
  Administered 2014-12-02: 500 mg via INTRAVENOUS
  Filled 2014-12-02: qty 100

## 2014-12-02 MED ORDER — LACTATED RINGERS IR SOLN
Status: DC | PRN
  Administered 2014-12-02 (×2): 1000 mL

## 2014-12-02 MED ORDER — HYDROMORPHONE HCL 1 MG/ML IJ SOLN
1.0000 mg | Freq: Once | INTRAMUSCULAR | Status: AC
  Administered 2014-12-02: 1 mg via INTRAVENOUS
  Filled 2014-12-02: qty 1

## 2014-12-02 MED ORDER — HYDROMORPHONE HCL 1 MG/ML IJ SOLN
0.2500 mg | INTRAMUSCULAR | Status: DC | PRN
  Administered 2014-12-02 (×2): 0.5 mg via INTRAVENOUS

## 2014-12-02 MED ORDER — LABETALOL HCL 5 MG/ML IV SOLN
INTRAVENOUS | Status: DC | PRN
  Administered 2014-12-02: 7.5 mg via INTRAVENOUS
  Administered 2014-12-02: 2.5 mg via INTRAVENOUS
  Administered 2014-12-02 (×2): 5 mg via INTRAVENOUS

## 2014-12-02 MED ORDER — ESMOLOL HCL 10 MG/ML IV SOLN
INTRAVENOUS | Status: AC
  Filled 2014-12-02: qty 10

## 2014-12-02 MED ORDER — FENTANYL CITRATE (PF) 250 MCG/5ML IJ SOLN
INTRAMUSCULAR | Status: AC
  Filled 2014-12-02: qty 25

## 2014-12-02 MED ORDER — ROCURONIUM BROMIDE 100 MG/10ML IV SOLN
INTRAVENOUS | Status: DC | PRN
  Administered 2014-12-02: 20 mg via INTRAVENOUS
  Administered 2014-12-02: 50 mg via INTRAVENOUS

## 2014-12-02 MED ORDER — PHENTERMINE HCL 37.5 MG PO TABS: 18.7500 mg | ORAL_TABLET | Freq: Two times a day (BID) | ORAL | Status: DC

## 2014-12-02 MED ORDER — SODIUM CHLORIDE 0.9 % IV BOLUS (SEPSIS)
1000.0000 mL | Freq: Once | INTRAVENOUS | Status: AC
  Administered 2014-12-02: 1000 mL via INTRAVENOUS

## 2014-12-02 MED ORDER — ESMOLOL HCL 10 MG/ML IV SOLN
INTRAVENOUS | Status: DC | PRN
  Administered 2014-12-02: 30 mg via INTRAVENOUS
  Administered 2014-12-02: 20 mg via INTRAVENOUS

## 2014-12-02 MED ORDER — ROCURONIUM BROMIDE 100 MG/10ML IV SOLN
INTRAVENOUS | Status: AC
  Filled 2014-12-02: qty 1

## 2014-12-02 MED ORDER — DIPHENHYDRAMINE HCL 25 MG PO CAPS: 25.0000 mg | ORAL_CAPSULE | Freq: Four times a day (QID) | ORAL | Status: DC | PRN

## 2014-12-02 MED ORDER — GI COCKTAIL ~~LOC~~
30.0000 mL | Freq: Once | ORAL | Status: AC
  Administered 2014-12-02: 30 mL via ORAL
  Filled 2014-12-02: qty 30

## 2014-12-02 MED ORDER — INSULIN ASPART 100 UNIT/ML ~~LOC~~ SOLN
0.0000 [IU] | Freq: Three times a day (TID) | SUBCUTANEOUS | Status: DC
  Administered 2014-12-03: 8 [IU] via SUBCUTANEOUS
  Administered 2014-12-03 – 2014-12-04 (×5): 3 [IU] via SUBCUTANEOUS
  Administered 2014-12-05: 2 [IU] via SUBCUTANEOUS
  Administered 2014-12-05: 5 [IU] via SUBCUTANEOUS
  Administered 2014-12-05 – 2014-12-06 (×2): 3 [IU] via SUBCUTANEOUS
  Administered 2014-12-06: 2 [IU] via SUBCUTANEOUS
  Administered 2014-12-06: 3 [IU] via SUBCUTANEOUS
  Administered 2014-12-07: 2 [IU] via SUBCUTANEOUS
  Administered 2014-12-07: 3 [IU] via SUBCUTANEOUS
  Administered 2014-12-07: 5 [IU] via SUBCUTANEOUS
  Administered 2014-12-08 – 2014-12-09 (×2): 3 [IU] via SUBCUTANEOUS
  Administered 2014-12-09: 2 [IU] via SUBCUTANEOUS
  Administered 2014-12-10 (×2): 3 [IU] via SUBCUTANEOUS

## 2014-12-02 MED ORDER — CIPROFLOXACIN IN D5W 400 MG/200ML IV SOLN
400.0000 mg | Freq: Once | INTRAVENOUS | Status: AC
  Administered 2014-12-02: 400 mg via INTRAVENOUS
  Filled 2014-12-02: qty 200

## 2014-12-02 MED ORDER — PROPOFOL 10 MG/ML IV BOLUS
INTRAVENOUS | Status: AC
Start: 2014-12-02 — End: 2014-12-02
  Filled 2014-12-02: qty 20

## 2014-12-02 MED ORDER — ONDANSETRON 4 MG PO TBDP
4.0000 mg | ORAL_TABLET | Freq: Four times a day (QID) | ORAL | Status: DC | PRN
  Filled 2014-12-02: qty 1

## 2014-12-02 MED ORDER — ACETAMINOPHEN 325 MG PO TABS: 650.0000 mg | ORAL_TABLET | Freq: Four times a day (QID) | ORAL | Status: DC | PRN

## 2014-12-02 MED ORDER — TRIAMCINOLONE ACETONIDE 55 MCG/ACT NA AERO
2.0000 | INHALATION_SPRAY | Freq: Every day | NASAL | Status: DC
  Administered 2014-12-02 – 2014-12-09 (×8): 2 via NASAL
  Filled 2014-12-02 (×2): qty 21.6
  Filled 2014-12-02: qty 10.8

## 2014-12-02 MED ORDER — TOPIRAMATE 25 MG PO TABS
25.0000 mg | ORAL_TABLET | Freq: Two times a day (BID) | ORAL | Status: DC
  Administered 2014-12-03 – 2014-12-10 (×7): 25 mg via ORAL
  Filled 2014-12-02 (×19): qty 1

## 2014-12-02 MED ORDER — GLYCOPYRROLATE 0.2 MG/ML IJ SOLN
INTRAMUSCULAR | Status: DC | PRN
  Administered 2014-12-02: 0.1 mg via INTRAVENOUS

## 2014-12-02 MED ORDER — ACYCLOVIR 400 MG PO TABS
400.0000 mg | ORAL_TABLET | Freq: Two times a day (BID) | ORAL | Status: DC
  Filled 2014-12-02 (×2): qty 1

## 2014-12-02 MED ORDER — BUPIVACAINE-EPINEPHRINE 0.5% -1:200000 IJ SOLN
INTRAMUSCULAR | Status: DC | PRN
  Administered 2014-12-02: 20 mL

## 2014-12-02 MED ORDER — ONDANSETRON HCL 4 MG/2ML IJ SOLN: 4.0000 mg | Freq: Four times a day (QID) | INTRAMUSCULAR | Status: DC | PRN

## 2014-12-02 MED ORDER — ALBUTEROL SULFATE (2.5 MG/3ML) 0.083% IN NEBU
2.5000 mg | INHALATION_SOLUTION | RESPIRATORY_TRACT | Status: DC | PRN
  Administered 2014-12-03 – 2014-12-08 (×7): 2.5 mg via RESPIRATORY_TRACT
  Filled 2014-12-02 (×6): qty 3

## 2014-12-02 MED ORDER — ALBUTEROL SULFATE HFA 108 (90 BASE) MCG/ACT IN AERS: 1.0000 | INHALATION_SPRAY | RESPIRATORY_TRACT | Status: DC | PRN

## 2014-12-02 MED ORDER — ACETAMINOPHEN 650 MG RE SUPP: 650.0000 mg | Freq: Four times a day (QID) | RECTAL | Status: DC | PRN

## 2014-12-02 MED ORDER — CIPROFLOXACIN IN D5W 400 MG/200ML IV SOLN
400.0000 mg | Freq: Two times a day (BID) | INTRAVENOUS | Status: DC
  Administered 2014-12-02 – 2014-12-04 (×4): 400 mg via INTRAVENOUS
  Filled 2014-12-02 (×5): qty 200

## 2014-12-02 MED ORDER — BUPIVACAINE-EPINEPHRINE (PF) 0.5% -1:200000 IJ SOLN
INTRAMUSCULAR | Status: AC
  Filled 2014-12-02: qty 30

## 2014-12-02 MED ORDER — HALOPERIDOL LACTATE 5 MG/ML IJ SOLN
2.0000 mg | Freq: Once | INTRAMUSCULAR | Status: AC
  Administered 2014-12-02: 2 mg via INTRAVENOUS
  Filled 2014-12-02: qty 1

## 2014-12-02 MED ORDER — ONDANSETRON HCL 4 MG/2ML IJ SOLN
4.0000 mg | Freq: Once | INTRAMUSCULAR | Status: AC
  Administered 2014-12-02: 4 mg via INTRAVENOUS
  Filled 2014-12-02: qty 2

## 2014-12-02 MED ORDER — LIDOCAINE HCL (CARDIAC) 20 MG/ML IV SOLN
INTRAVENOUS | Status: AC
  Filled 2014-12-02: qty 5

## 2014-12-02 MED ORDER — ENOXAPARIN SODIUM 40 MG/0.4ML ~~LOC~~ SOLN: 40.0000 mg | SUBCUTANEOUS | Status: DC

## 2014-12-02 MED ORDER — CHLORHEXIDINE GLUCONATE CLOTH 2 % EX PADS: 6.0000 | MEDICATED_PAD | Freq: Every day | CUTANEOUS | Status: DC

## 2014-12-02 MED ORDER — KETOROLAC TROMETHAMINE 30 MG/ML IJ SOLN
30.0000 mg | Freq: Once | INTRAMUSCULAR | Status: AC
  Administered 2014-12-02: 30 mg via INTRAVENOUS
  Filled 2014-12-02: qty 1

## 2014-12-02 MED ORDER — LABETALOL HCL 5 MG/ML IV SOLN
INTRAVENOUS | Status: AC
  Filled 2014-12-02: qty 4

## 2014-12-02 MED ORDER — FENTANYL CITRATE (PF) 100 MCG/2ML IJ SOLN
INTRAMUSCULAR | Status: AC
  Filled 2014-12-02: qty 4

## 2014-12-02 MED ORDER — MIDAZOLAM HCL 2 MG/2ML IJ SOLN
INTRAMUSCULAR | Status: AC
  Filled 2014-12-02: qty 4

## 2014-12-02 MED ORDER — LIDOCAINE HCL (PF) 2 % IJ SOLN
INTRAMUSCULAR | Status: DC | PRN
  Administered 2014-12-02: 75 mg via INTRADERMAL

## 2014-12-02 MED ORDER — SUGAMMADEX SODIUM 500 MG/5ML IV SOLN
INTRAVENOUS | Status: AC
  Filled 2014-12-02: qty 5

## 2014-12-02 MED ORDER — DIPHENHYDRAMINE HCL 50 MG/ML IJ SOLN: 25.0000 mg | Freq: Four times a day (QID) | INTRAMUSCULAR | Status: DC | PRN

## 2014-12-02 MED ORDER — MORPHINE SULFATE (PF) 2 MG/ML IV SOLN
1.0000 mg | INTRAVENOUS | Status: DC | PRN
  Administered 2014-12-02: 1 mg via INTRAVENOUS
  Filled 2014-12-02: qty 1

## 2014-12-02 MED ORDER — LACTATED RINGERS IV SOLN
INTRAVENOUS | Status: DC | PRN
  Administered 2014-12-02 (×3): via INTRAVENOUS

## 2014-12-02 MED ORDER — POTASSIUM CHLORIDE IN NACL 20-0.9 MEQ/L-% IV SOLN
INTRAVENOUS | Status: DC
  Administered 2014-12-02: 125 mL/h via INTRAVENOUS
  Filled 2014-12-02 (×2): qty 1000

## 2014-12-02 MED ORDER — PROPOFOL 10 MG/ML IV BOLUS
INTRAVENOUS | Status: DC | PRN
  Administered 2014-12-02 (×3): 50 mg via INTRAVENOUS
  Administered 2014-12-02: 200 mg via INTRAVENOUS

## 2014-12-02 SURGICAL SUPPLY — 42 items
APL SKNCLS STERI-STRIP NONHPOA (GAUZE/BANDAGES/DRESSINGS) ×1
APPLIER CLIP ROT 10 11.4 M/L (STAPLE) ×3
APR CLP MED LRG 11.4X10 (STAPLE) ×1
BAG SPEC RTRVL LRG 6X4 10 (ENDOMECHANICALS) ×1
BENZOIN TINCTURE PRP APPL 2/3 (GAUZE/BANDAGES/DRESSINGS) ×3 IMPLANT
CABLE HIGH FREQUENCY MONO STRZ (ELECTRODE) ×3 IMPLANT
CATH REDDICK CHOLANGI 4FR 50CM (CATHETERS) ×2 IMPLANT
CHLORAPREP W/TINT 26ML (MISCELLANEOUS) ×3 IMPLANT
CLIP APPLIE ROT 10 11.4 M/L (STAPLE) ×1 IMPLANT
CLOSURE WOUND 1/2 X4 (GAUZE/BANDAGES/DRESSINGS) ×1
COVER MAYO STAND STRL (DRAPES) ×3 IMPLANT
COVER SURGICAL LIGHT HANDLE (MISCELLANEOUS) ×3 IMPLANT
DECANTER SPIKE VIAL GLASS SM (MISCELLANEOUS) ×3 IMPLANT
DRAIN CHANNEL 19F RND (DRAIN) ×2 IMPLANT
DRAPE C-ARM 42X120 X-RAY (DRAPES) ×3 IMPLANT
DRAPE LAPAROSCOPIC ABDOMINAL (DRAPES) ×3 IMPLANT
ELECT REM PT RETURN 9FT ADLT (ELECTROSURGICAL) ×3
ELECTRODE REM PT RTRN 9FT ADLT (ELECTROSURGICAL) ×1 IMPLANT
EVACUATOR SILICONE 100CC (DRAIN) ×2 IMPLANT
GAUZE SPONGE 2X2 8PLY STRL LF (GAUZE/BANDAGES/DRESSINGS) ×1 IMPLANT
GAUZE SPONGE 4X4 12PLY STRL (GAUZE/BANDAGES/DRESSINGS) ×2 IMPLANT
GLOVE SURG ORTHO 8.0 STRL STRW (GLOVE) ×1 IMPLANT
GLOVE SURG SS PI 8.0 STRL IVOR (GLOVE) ×2 IMPLANT
GOWN STRL REUS W/TWL XL LVL3 (GOWN DISPOSABLE) ×6 IMPLANT
HEMOSTAT SURGICEL 4X8 (HEMOSTASIS) IMPLANT
IV CATH 14GX2 1/4 (CATHETERS) ×2 IMPLANT
KIT BASIN OR (CUSTOM PROCEDURE TRAY) ×3 IMPLANT
POUCH SPECIMEN RETRIEVAL 10MM (ENDOMECHANICALS) ×3 IMPLANT
SCISSORS LAP 5X35 DISP (ENDOMECHANICALS) ×3 IMPLANT
SET CHOLANGIOGRAPH MIX (MISCELLANEOUS) ×3 IMPLANT
SET IRRIG TUBING LAPAROSCOPIC (IRRIGATION / IRRIGATOR) ×3 IMPLANT
SLEEVE XCEL OPT CAN 5 100 (ENDOMECHANICALS) ×3 IMPLANT
SPONGE GAUZE 2X2 STER 10/PKG (GAUZE/BANDAGES/DRESSINGS) ×2
STRIP CLOSURE SKIN 1/2X4 (GAUZE/BANDAGES/DRESSINGS) ×2 IMPLANT
SUT ETHILON 3 0 PS 1 (SUTURE) ×2 IMPLANT
SUT MNCRL AB 4-0 PS2 18 (SUTURE) ×3 IMPLANT
TAPE CLOTH SURG 4X10 WHT LF (GAUZE/BANDAGES/DRESSINGS) ×2 IMPLANT
TOWEL OR 17X26 10 PK STRL BLUE (TOWEL DISPOSABLE) ×3 IMPLANT
TRAY LAPAROSCOPIC (CUSTOM PROCEDURE TRAY) ×3 IMPLANT
TROCAR BLADELESS OPT 5 100 (ENDOMECHANICALS) ×3 IMPLANT
TROCAR XCEL BLUNT TIP 100MML (ENDOMECHANICALS) ×3 IMPLANT
TROCAR XCEL NON-BLD 11X100MML (ENDOMECHANICALS) ×3 IMPLANT

## 2014-12-02 NOTE — ED Notes (Signed)
rn obtained consent. Pt asking questions like "are ya'll going to let me die".

## 2014-12-02 NOTE — ED Notes (Signed)
PT CAN GO AT 09:30

## 2014-12-02 NOTE — Progress Notes (Signed)
Pt has her home CPAP setup at bedside. RT added water to humidity chamber. No other request at this time. RT will monitor.

## 2014-12-02 NOTE — Op Note (Signed)
Procedure Note  Pre-operative Diagnosis:  Acute cholecystitis  Post-operative Diagnosis:  Acute cholecystitis, cholelithiasis  Surgeon:  [name], MD, FACS  Assistant:  Gurney Maxin, MD   Procedure:  Laparoscopic cholecystectomy  Anesthesia:  General  Estimated Blood Loss:  minimal  Drains: 19Fr Blake to suction bulb         Specimen: Gallbladder to pathology  Indications:  This is a 39 yo morbidly obese black female who has a BMI of 50 who woke up on Friday with some abdominal bloating and some pain in her epigastrium. She then developed significant nausea and vomiting. She thought she had gastroenteritis and was trying to control it at home. The nausea and vomiting improved some on Saturday and she just had some hot and cold chills. On Sunday the nausea and vomiting returned with significant epigastric pain. She decided to come in to the [facility] for further evaluation. She has been found to have a WBC of 15K, normal LFTs, but a CT scan indicative of acute cholecystitis.  Procedure Details:  The patient was seen in the pre-op holding area. The risks, benefits, complications, treatment options, and expected outcomes were previously discussed with the patient. The patient agreed with the proposed plan and has signed the informed consent form.  The patient was brought to the Operating Room, identified as Ana Long and the procedure verified as laparoscopic cholecystectomy with intraoperative cholangiography. A "time out" was completed and the above information confirmed.  The patient was placed in the supine position. Following induction of general anesthesia, the abdomen was prepped and draped in the usual aseptic fashion.  An incision was made in the skin near the umbilicus. The midline fascia was incised and the peritoneal cavity was entered and a Hasson canula was introduced under direct vision.  The Hasson canula was secured with a 0-Vicryl pursestring suture.  Pneumoperitoneum was established with carbon dioxide. Additional trocars were introduced under direct vision along the right costal margin in the midline, mid-clavicular line, and anterior axillary line.   The gallbladder was identified.  It was acutely inflamed and very thickened.  It was aspirated with a trocar. The fundus grasped and retracted cephalad. Adhesions were taken down bluntly and the electrocautery was utilized as needed, taking care not to injure any adjacent structures. The infundibulum was grasped and retracted laterally, exposing the peritoneum overlying the triangle of Calot. The peritoneum was incised and structures exposed with blunt dissection. The cystic duct was clearly identified, bluntly dissected circumferentially, and clipped at the neck of the gallbladder.  An incision was made in the cystic duct and the cholangiogram catheter introduced. The catheter was secured using an ligaclip.  However, despite multiple attempts with the Reddick catheter, a seal was never attained and cholagiography was not performed.  The cystic duct was then ligated with surgical clips and divided. The cystic artery was identified, dissected circumferentially, ligated with ligaclips, and divided.  The gallbladder was dissected away from the liver bed using the electrocautery for hemostasis. The gallbladder was completely removed from the liver and placed into an endocatch bag. The gallbladder was removed in the endocatch bag through the umbilical port site and submitted to pathology for review.  The right upper quadrant was irrigated and the gallbladder bed was inspected. Hemostasis was achieved with the electrocautery.  Pneumoperitoneum was released after viewing removal of the trocars with good hemostasis noted. The umbilical wound was irrigated and the fascia was then closed with the pursestring suture.  Local anesthetic was  infiltrated at all port sites. The skin incisions were closed with 4-0  Monocril subcuticular sutures and steri-strips and dressings were applied.  Instrument, sponge, and needle counts were correct at the conclusion of the case.  The patient was awakened from anesthesia and brought to the recovery room in stable condition.  The patient tolerated the procedure well.   [name], MD, [facility] Office: [phone]

## 2014-12-02 NOTE — H&P (Signed)
Ana Long 11-26-1975  [id number].   Primary Care MD: [facility] Chief Complaint/Reason for Consult: abdominal pain HPI: This is a 39 yo morbidly obese black female who has a BMI of 50 who woke up on Friday with some abdominal bloating and some pain in her epigastrium.  She then developed significant nausea and vomiting.  She thought she had gastroenteritis and was trying to control it at home.  The nausea and vomiting improved some on Saturday and she just had some hot and cold chills.  On Sunday the nausea and vomiting returned with significant epigastric pain.  She decided to come in to the [facility] for further evaluation.  She has been found to have a WBC of 15K, normal LFTs, but a CT scan indicative of acute cholecystitis.  We have been asked to evaluate the patient for surgical admission.  ROS: Please see HPI, otherwise, she wears a CPAP at night and has had some SOB secondary to pain this weekend.  Decrease urine output since yesterday.  No BM since Friday.  Otherwise all other systems are currently negative.  Family History  Problem Relation Age of Onset  . Other Mother   . Diabetes Maternal Aunt   . Heart attack Maternal Aunt   . Alzheimer's disease Maternal Uncle   . Cancer Maternal Uncle     prostate  . Diabetes Paternal Aunt   . Cancer Paternal Aunt     brain,mouth  . Alzheimer's disease Paternal Aunt   . Diabetes Paternal Uncle   . Diabetes Maternal Grandmother   . Diabetes Maternal Grandfather   . Leukemia Cousin   . Other Other   . Obesity Other   . Other Other     Past Medical History  Diagnosis Date  . GERD (gastroesophageal reflux disease)   . Diabetes mellitus     intolerant to Metformin  . Herpes genital   . PCOS (polycystic ovarian syndrome)   . Morbid obesity with BMI of 50 to 60 07/23/2009  . Dry skin     hands   . Asthma     prn inhaler  . Arthritis     major joints  . Complication of anesthesia     states is hard to wake  up post-op  . Family history of anesthesia complication     mother went into coma during c-section  . Sleep apnea, obstructive     uses CPAP nightly - had sleep study 08/22/2007  . Carpal tunnel syndrome of right wrist 09/2012  . Obesity hypoventilation syndrome ([place]) 10/30/2012  . OSA on CPAP      AHI was on  11-17-12 to 120/hr, titrated to 15 cm with 3 cm EPR/   . Seasonal allergies   . Obesity, morbid ([place]) 01/10/2014    Past Surgical History  Procedure Laterality Date  . Hysteroscopy w/d&c  01-08-2002  . Carpal tunnel release Right 10/02/2012    Procedure: RIGHT CARPAL TUNNEL RELEASE ENDOSCOPIC;  Surgeon: Jolyn Nap, MD;  Location: [place];  Service: Orthopedics;  Laterality: Right;    Social History:  reports that she quit smoking about 4 years ago. She has never used smokeless tobacco. She reports that she drinks alcohol. She reports that she does not use illicit drugs.  Allergies:  Allergies  Allergen Reactions  . Citrus Hives and Itching  . Latex Swelling  . Penicillins Hives and Itching  . Soap Hives, Itching and Swelling    PUREX LAUNDRY DETERGENT  .  Tomato Hives and Itching  . Doxycycline Other (See Comments)    AFFECTS JOINTS  . Metformin And Related Rash    GI UPSET     (Not in a hospital admission)  Blood pressure 118/63, pulse 114, temperature 98.4 F (36.9 C), temperature source Oral, resp. rate 22, height 5' 2"  (1.575 m), weight 124.286 kg (274 lb), last menstrual period 11/19/2014, SpO2 100 %. Physical Exam: General: pleasant, morbidly obese black female who is laying in bed in NAD and lethargic secondary to pain meds HEENT: head is normocephalic, atraumatic.  Sclera are noninjected.  PERRL.  Ears and nose without any masses or lesions.  Mouth is pink and moist Heart: regular  Rhythm, but tachy.  Normal s1,s2. No obvious murmurs, gallops, or rubs noted.  Palpable radial and pedal pulses bilaterally Lungs: CTAB, no wheezes, rhonchi, or  rales noted.  Respiratory effort nonlabored Abd: soft, tender in LUQ, RUQ, but greatest in epigastrium, ND, morbidly obese, +BS, no masses, hernias, or organomegaly MS: all 4 extremities are symmetrical with no cyanosis, clubbing, or edema. Skin: warm and dry with no masses, lesions, or rashes Psych: Ox3, lethargic at times though secondary to pain meds.    Results for orders placed or performed during the hospital encounter of 12/01/14 (from the past 48 hour(s))  Lipase, blood     Status: Abnormal   Collection Time: 12/01/14 11:44 PM  Result Value Ref Range   Lipase 16 (L) 22 - 51 U/L  Comprehensive metabolic panel     Status: Abnormal   Collection Time: 12/01/14 11:44 PM  Result Value Ref Range   Sodium 135 135 - 145 mmol/L   Potassium 3.8 3.5 - 5.1 mmol/L   Chloride 101 101 - 111 mmol/L   CO2 24 22 - 32 mmol/L   Glucose, Bld 209 (H) 65 - 99 mg/dL   BUN 6 6 - 20 mg/dL   Creatinine, Ser 0.56 0.44 - 1.00 mg/dL   Calcium 9.1 8.9 - 10.3 mg/dL   Total Protein 8.3 (H) 6.5 - 8.1 g/dL   Albumin 3.8 3.5 - 5.0 g/dL   AST 12 (L) 15 - 41 U/L   ALT 14 14 - 54 U/L   Alkaline Phosphatase 49 38 - 126 U/L   Total Bilirubin 1.2 0.3 - 1.2 mg/dL   GFR calc non Af Amer >60 >60 mL/min   GFR calc Af Amer >60 >60 mL/min    Comment: (NOTE) The eGFR has been calculated using the CKD EPI equation. This calculation has not been validated in all clinical situations. eGFR's persistently <60 mL/min signify possible Chronic Kidney Disease.    Anion gap 10 5 - 15  CBC     Status: Abnormal   Collection Time: 12/01/14 11:44 PM  Result Value Ref Range   WBC 15.0 (H) 4.0 - 10.5 K/uL   RBC 4.70 3.87 - 5.11 MIL/uL   Hemoglobin 12.4 12.0 - 15.0 g/dL   HCT 37.7 36.0 - 46.0 %   MCV 80.2 78.0 - 100.0 fL   MCH 26.4 26.0 - 34.0 pg   MCHC 32.9 30.0 - 36.0 g/dL   RDW 14.0 11.5 - 15.5 %   Platelets 345 150 - 400 K/uL  Urinalysis, Routine w reflex microscopic (not at [facility])     Status: Abnormal   Collection  Time: 12/02/14  1:28 AM  Result Value Ref Range   Color, Urine AMBER (A) YELLOW    Comment: BIOCHEMICALS MAY BE AFFECTED BY COLOR   APPearance CLOUDY (  A) CLEAR   Specific Gravity, Urine 1.026 1.005 - 1.030   pH 6.0 5.0 - 8.0   Glucose, UA NEGATIVE NEGATIVE mg/dL   Hgb urine dipstick NEGATIVE NEGATIVE   Bilirubin Urine SMALL (A) NEGATIVE   Ketones, ur >80 (A) NEGATIVE mg/dL   Protein, ur 30 (A) NEGATIVE mg/dL   Urobilinogen, UA 1.0 0.0 - 1.0 mg/dL   Nitrite NEGATIVE NEGATIVE   Leukocytes, UA TRACE (A) NEGATIVE  Urine microscopic-add on     Status: Abnormal   Collection Time: 12/02/14  1:28 AM  Result Value Ref Range   Squamous Epithelial / LPF MANY (A) RARE   WBC, UA 7-10 <3 WBC/hpf   RBC / HPF 0-2 <3 RBC/hpf   Bacteria, UA MANY (A) RARE  Pregnancy, urine     Status: None   Collection Time: 12/02/14  1:35 AM  Result Value Ref Range   Preg Test, Ur NEGATIVE NEGATIVE    Comment:        THE SENSITIVITY OF THIS METHODOLOGY IS >20 mIU/mL.    Dg Abd Acute W/chest  12/02/2014   CLINICAL DATA:  Upper bilateral abdominal pain radiating to the back for 3 days. Nausea and vomiting.  EXAM: DG ABDOMEN ACUTE W/ 1V CHEST  COMPARISON:  Chest 07/25/2013  FINDINGS: Normal heart size and pulmonary vascularity. No focal airspace disease or consolidation in the lungs. No blunting of costophrenic angles. No pneumothorax. Mediastinal contours appear intact.  Scattered gas and stool in the colon. No small or large bowel distention. No free intra-abdominal air. No abnormal air-fluid levels. No radiopaque stones. Visualized bones appear intact.  IMPRESSION: Negative abdominal radiographs.  No acute cardiopulmonary disease.   Electronically Signed   By: Lucienne Capers M.D.   On: 12/02/2014 03:25   Ct Renal Stone Study  12/02/2014   CLINICAL DATA:  Mid abdominal pain radiating to the left flank for 3 days. Nausea and vomiting. Constipation. Difficulty urinating. Dark urine.  EXAM: CT ABDOMEN AND PELVIS  WITHOUT CONTRAST  TECHNIQUE: Multidetector CT imaging of the abdomen and pelvis was performed following the standard protocol without IV contrast.  COMPARISON:  CT pelvis 07/01/2009  FINDINGS: Lung bases are clear.  Kidneys are symmetrical in size and shape. No hydronephrosis or hydroureter. No renal, ureteral, or bladder stones. Bladder wall is not thickened.  The gallbladder is distended with slightly increased density suggesting sludge. There is evidence of gallbladder wall thickening with infiltration in the fat around the gallbladder. Appearance is consistent with acute cholecystitis. Mild reactive inflammation in the splenic flexure of the colon.  The unenhanced appearance of the liver, spleen, pancreas, adrenal glands, abdominal aorta, inferior vena cava, and retroperitoneal lymph nodes is unremarkable. Stomach, small bowel, and colon are decompressed. Scattered stool in the colon. Small umbilical/ periumbilical hernia containing fat. No free air or free fluid in the abdomen.  Pelvis: The appendix is normal. Uterus and ovaries are not enlarged. Small amount of free fluid in the pelvis is likely physiologic. No pelvic mass or lymphadenopathy. Degenerative changes in the lumbar spine. No destructive bone lesions.  IMPRESSION: Inflammatory changes in the gallbladder as described consistent with acute cholecystitis. No renal or ureteral stone or obstruction.   Electronically Signed   By: Lucienne Capers M.D.   On: 12/02/2014 06:08       Assessment/Plan 1. Acute cholecystitis -admit, IVFs, pain meds, nausea meds -plan for OR for lap chole.  Timing to be determined after d/w Dr. Harlow Asa -the patient has OSA as well as  hypoventilation syndrome.  I did discuss concerns about coming off the ventilator post op with her and her aunt who were present in the room. -cont Cipro as she has a PCN allergy. 2. OSA -CPAP ordered qhs 3. DM -moderated SSI while NPO 4. DVT Prophylaxis SCDs/Lovenox tomorrow post  Marveen Reeks 12/02/2014, 8:46 AM Pager: [phone]

## 2014-12-02 NOTE — Consult Note (Signed)
Name: Ana Long MRN: [id number] DOB: 12-05-1975    ADMISSION DATE:  12/01/2014 CONSULTATION DATE:  10/10  REFERRING MD :  Harlow Asa   CHIEF COMPLAINT:  Preop clearance   BRIEF PATIENT DESCRIPTION:  39 year old [name] presents to the ER 10/10 w/ cc abd pain, nausea and vomiting. In ER found to have WBC ct 15K, CT c/w acute cholecystitis. She was admitted by surgical services. Has h/o asthma, obesity and OSA, therefore PCCM was asked to eval for pre-op clearance.   SIGNIFICANT EVENTS    STUDIES:  CT abd 10/10: Inflammatory changes in the gallbladder as described consistent with acute cholecystitis. No renal or ureteral stone or obstruction.   HISTORY OF PRESENT ILLNESS:   See above   PAST MEDICAL HISTORY :   has a past medical history of GERD (gastroesophageal reflux disease); Diabetes mellitus; Herpes genital; PCOS (polycystic ovarian syndrome); Morbid obesity with BMI of 50 to 60 (07/23/2009); Dry skin; Asthma; Arthritis; Complication of anesthesia; Family history of anesthesia complication; Sleep apnea, obstructive; Carpal tunnel syndrome of right wrist (09/2012); Obesity hypoventilation syndrome ([place]) (10/30/2012); OSA on CPAP; Seasonal allergies; Obesity, morbid ([place]) (01/10/2014); and Rheumatoid arthritis ([place]).  has past surgical history that includes Hysteroscopy w/D&C (01-08-2002) and Carpal tunnel release (Right, 10/02/2012). Prior to Admission medications   Medication Sig Start Date End Date Taking? Authorizing Provider  acyclovir (ZOVIRAX) 400 MG tablet TAKE ONE TABLET BY MOUTH TWICE DAILY 11/22/14  Yes Francesca Oman, DO  albuterol (VENTOLIN HFA) 108 (90 BASE) MCG/ACT inhaler Inhale 1-2 puffs into the lungs every 4 (four) hours as needed for wheezing or shortness of breath. 07/25/13  Yes Francesca Oman, DO  atorvastatin (LIPITOR) 10 MG tablet Take 1 tablet (10 mg total) by mouth daily. 07/24/14  Yes Alex Ronnie Derby, DO  glipiZIDE (GLUCOTROL) 5 MG tablet Take 1 tablet (5 mg  total) by mouth daily before breakfast. 09/19/14  Yes Francesca Oman, DO  Insulin Glargine (LANTUS SOLOSTAR) 100 UNIT/ML Solostar Pen Inject 20 Units into the skin every morning. 11/20/14  Yes Alex Ronnie Derby, DO  loratadine (CLARITIN) 10 MG tablet Take 1 tablet (10 mg total) by mouth 2 (two) times daily. 07/09/14  Yes Karlene Einstein, MD  phentermine (ADIPEX-P) 37.5 MG tablet Take 37.5 mg by mouth 2 times daily at 12 noon and 4 pm. Takes half tablet twice a day   Yes Historical Provider, MD  Probiotic Product (PROBIOTIC DAILY PO) Take 2 capsules by mouth every evening.    Yes Historical Provider, MD  ranitidine (ZANTAC) 150 MG tablet Take 150 mg by mouth 2 (two) times daily.   Yes Historical Provider, MD  senna (SENOKOT) 8.6 MG TABS tablet Take 2 tablets by mouth at bedtime.   Yes Historical Provider, MD  topiramate (TOPAMAX) 25 MG tablet Take 25 mg by mouth 2 (two) times daily.   Yes Historical Provider, MD  triamcinolone (NASACORT AQ) 55 MCG/ACT AERO nasal inhaler Place 2 sprays into the nose daily. 07/27/13  Yes Lucious Groves, DO  albuterol (PROAIR HFA) 108 (90 BASE) MCG/ACT inhaler Inhale 2 puffs into the lungs every 4 (four) hours as needed for wheezing or shortness of breath (cough).    Historical Provider, MD  famotidine (PEPCID) 20 MG tablet Take 20 mg by mouth at bedtime.    Historical Provider, MD  insulin glargine (LANTUS) 100 UNIT/ML injection Inject 40 Units into the skin at bedtime.    Historical Provider, MD  TURMERIC PO Take 2  tablets by mouth at bedtime. Takes generic turmeric    Historical Provider, MD   Allergies  Allergen Reactions  . Citrus Hives and Itching  . Latex Swelling  . Penicillins Hives and Itching  . Soap Hives, Itching and Swelling    PUREX LAUNDRY DETERGENT  . Tomato Hives and Itching  . Fluticasone     Per patient, makes sneeze and gag  . Other Itching    Chocolate, Bananas, Acidic foods  . Shellfish Allergy   . Doxycycline Other (See Comments)    AFFECTS  JOINTS  . Metformin And Related Rash    GI UPSET    FAMILY HISTORY:  family history includes Alzheimer's disease in her maternal uncle and paternal aunt; Cancer in her maternal uncle and paternal aunt; Diabetes in her maternal aunt, maternal grandfather, maternal grandmother, paternal aunt, and paternal uncle; Heart attack in her maternal aunt; Leukemia in her cousin; Obesity in her other; Other in her mother, other, and other. SOCIAL HISTORY:  reports that she quit smoking about 4 years ago. She has never used smokeless tobacco. She reports that she drinks alcohol. She reports that she does not use illicit drugs.  REVIEW OF SYSTEMS:   Constitutional: + fever, chills, weight loss, malaise/fatigue and diaphoresis.  HENT: Negative for hearing loss, ear pain, nosebleeds, congestion, sore throat, neck pain, tinnitus and ear discharge.   Eyes: Negative for blurred vision, double vision, photophobia, pain, discharge and redness.  Respiratory ROS: Negative for cough, hemoptysis, sputum production, shortness of breath, wheezing and stridor.  Has been compliant w/ her CPAP and has not needed rescue SABA recently.  Cardiovascular: Negative for chest pain, palpitations, orthopnea, claudication, leg swelling and PND.  Gastrointestinal: + heartburn, + nausea, + vomiting, + abdominal pain, no diarrhea, constipation, blood in stool and melena.  Genitourinary: Negative for dysuria, urgency, frequency, hematuria and flank pain.  Musculoskeletal: Negative for myalgias, back pain, joint pain and falls.  Skin: Negative for itching and rash.  Neurological: Negative for dizziness, tingling, tremors, sensory change, speech change, focal weakness, seizures, loss of consciousness, weakness and headaches.  Endo/Heme/Allergies: Negative for environmental allergies and polydipsia. Does not bruise/bleed easily.  SUBJECTIVE:  C/o 7/10 abd pain  VITAL SIGNS: Temp:  [98.2 F (36.8 C)-98.5 F (36.9 C)] 98.5 F (36.9 C)  (10/10 1419) Pulse Rate:  [107-129] 117 (10/10 1419) Resp:  [18-29] 22 (10/10 1419) BP: (118-158)/(63-114) 142/78 mmHg (10/10 1419) SpO2:  [89 %-100 %] 95 % (10/10 1419) Weight:  [124.286 kg (274 lb)] 124.286 kg (274 lb) (10/09 2235) Room air  PHYSICAL EXAMINATION: General:  Obese aaf resting comfortably in bed Neuro:  Awake, alert, no focal def  HEENT:  NCAT, no JVD  Cardiovascular:  rrr Lungs:  Clear  Abdomen:  Soft, tender to palp  Musculoskeletal:  Intact  Skin:  Intact    Recent Labs Lab 12/01/14 2344 12/02/14 1105  NA 135  --   K 3.8  --   CL 101  --   CO2 24  --   BUN 6  --   CREATININE 0.56 0.58  GLUCOSE 209*  --     Recent Labs Lab 12/01/14 2344 12/02/14 1105  HGB 12.4 11.8*  HCT 37.7 35.9*  WBC 15.0* 14.6*  PLT 345 286   Dg Abd Acute W/chest  12/02/2014   CLINICAL DATA:  Upper bilateral abdominal pain radiating to the back for 3 days. Nausea and vomiting.  EXAM: DG ABDOMEN ACUTE W/ 1V CHEST  COMPARISON:  Chest 07/25/2013  FINDINGS: Normal heart size and pulmonary vascularity. No focal airspace disease or consolidation in the lungs. No blunting of costophrenic angles. No pneumothorax. Mediastinal contours appear intact.  Scattered gas and stool in the colon. No small or large bowel distention. No free intra-abdominal air. No abnormal air-fluid levels. No radiopaque stones. Visualized bones appear intact.  IMPRESSION: Negative abdominal radiographs.  No acute cardiopulmonary disease.   Electronically Signed   By: Lucienne Capers M.D.   On: 12/02/2014 03:25   Ct Renal Stone Study  12/02/2014   CLINICAL DATA:  Mid abdominal pain radiating to the left flank for 3 days. Nausea and vomiting. Constipation. Difficulty urinating. Dark urine.  EXAM: CT ABDOMEN AND PELVIS WITHOUT CONTRAST  TECHNIQUE: Multidetector CT imaging of the abdomen and pelvis was performed following the standard protocol without IV contrast.  COMPARISON:  CT pelvis 07/01/2009  FINDINGS: Lung bases  are clear.  Kidneys are symmetrical in size and shape. No hydronephrosis or hydroureter. No renal, ureteral, or bladder stones. Bladder wall is not thickened.  The gallbladder is distended with slightly increased density suggesting sludge. There is evidence of gallbladder wall thickening with infiltration in the fat around the gallbladder. Appearance is consistent with acute cholecystitis. Mild reactive inflammation in the splenic flexure of the colon.  The unenhanced appearance of the liver, spleen, pancreas, adrenal glands, abdominal aorta, inferior vena cava, and retroperitoneal lymph nodes is unremarkable. Stomach, small bowel, and colon are decompressed. Scattered stool in the colon. Small umbilical/ periumbilical hernia containing fat. No free air or free fluid in the abdomen.  Pelvis: The appendix is normal. Uterus and ovaries are not enlarged. Small amount of free fluid in the pelvis is likely physiologic. No pelvic mass or lymphadenopathy. Degenerative changes in the lumbar spine. No destructive bone lesions.  IMPRESSION: Inflammatory changes in the gallbladder as described consistent with acute cholecystitis. No renal or ureteral stone or obstruction.   Electronically Signed   By: Lucienne Capers M.D.   On: 12/02/2014 06:08    ASSESSMENT / PLAN:  OSA (CPAP compliant) Asthma (currently controlled) Would be moderate risk from pulmonary stand-point for post-op respiratory failure as a consequence of hypoventilation, airway obstruction and or bronchospasm. However there is no absolute contra-indication for surgery and in this case seems benefits of surgery outweigh risk of no surgery.  Plan Proceed w/ surgery as planned Recover in the SDU  Would use BIPAP at HS and post-op initially given risk of residual narcotic/anesthesia effect and hypoventilation PRN SABA  Acute cholecystitis  Plan Per surgical services  DM Plan ssi   Erick Colace ACNP-BC [place] Pager #  [phone] OR # [phone] if no answer  12/02/2014, 3:02 PM  Attending Note:  I have examined patient, reviewed labs, studies and notes. I have discussed the case with Jerrye Bushy, and I agree with the data and plans as amended above. She carries a hx of asthma, uses prn albuterol. No PFT on my review. Also with documented OSA/OHS, compliant with CPAP at home. She is admitted with cholecystitis, will need cholecystectomy. She does not have any evidence of bronchoscpam on my eval. She will need CPAP qhs and when sleeping post-op. Will order a titration device and follow her in the SDU post-op.   Baltazar Apo, MD, PhD 12/02/2014, 3:45 PM [facility] [phone] or if no answer [phone]

## 2014-12-02 NOTE — ED Notes (Signed)
rn called OR, OR still had not heard about when surgery time is, rn told to take pt upstairs.

## 2014-12-02 NOTE — Progress Notes (Signed)
Internal Medicine Clinic Attending  Case discussed with Dr. Wilson soon after the resident saw the patient.  We reviewed the resident's history and exam and pertinent patient test results.  I agree with the assessment, diagnosis, and plan of care documented in the resident's note.  

## 2014-12-02 NOTE — Anesthesia Procedure Notes (Signed)
Procedure Name: Intubation Date/Time: 12/02/2014 3:05 PM Performed by: Lollie Sails Pre-anesthesia Checklist: Patient identified, Emergency Drugs available, Suction available, Patient being monitored and Timeout performed Patient Re-evaluated:Patient Re-evaluated prior to inductionOxygen Delivery Method: Circle system utilized Preoxygenation: Pre-oxygenation with 100% oxygen Intubation Type: IV induction Ventilation: Mask ventilation without difficulty Laryngoscope Size: Miller and 3 Grade View: Grade I Tube type: Oral Tube size: 7.5 mm Number of attempts: 1 Airway Equipment and Method: Stylet and Patient positioned with wedge pillow Placement Confirmation: ETT inserted through vocal cords under direct vision,  positive ETCO2 and breath sounds checked- equal and bilateral Secured at: 21 cm Tube secured with: Tape Dental Injury: Teeth and Oropharynx as per pre-operative assessment  Comments: Wedge pillow removed post intubation.

## 2014-12-02 NOTE — Anesthesia Preprocedure Evaluation (Addendum)
Anesthesia Evaluation  Patient identified by MRN, date of birth, ID band Patient awake    Reviewed: Allergy & Precautions, H&P , NPO status , Patient's Chart, lab work & pertinent test results  Airway Mallampati: III  TM Distance: >3 FB Neck ROM: Full    Dental no notable dental hx. (+) Teeth Intact, Dental Advisory Given   Pulmonary asthma , sleep apnea and Continuous Positive Airway Pressure Ventilation , former smoker,    Pulmonary exam normal breath sounds clear to auscultation       Cardiovascular negative cardio ROS   Rhythm:Regular Rate:Normal     Neuro/Psych negative neurological ROS  negative psych ROS   GI/Hepatic Neg liver ROS, GERD  Medicated,  Endo/Other  diabetes, Type 1, Insulin DependentMorbid obesity  Renal/GU negative Renal ROS  negative genitourinary   Musculoskeletal  (+) Arthritis , Osteoarthritis,    Abdominal   Peds  Hematology negative hematology ROS (+)   Anesthesia Other Findings   Reproductive/Obstetrics negative OB ROS                            Anesthesia Physical Anesthesia Plan  ASA: III  Anesthesia Plan: General   Post-op Pain Management:    Induction: Intravenous  Airway Management Planned: Oral ETT and Video Laryngoscope Planned  Additional Equipment:   Intra-op Plan:   Post-operative Plan: Extubation in OR  Informed Consent: I have reviewed the patients History and Physical, chart, labs and discussed the procedure including the risks, benefits and alternatives for the proposed anesthesia with the patient or authorized representative who has indicated his/her understanding and acceptance.   Dental advisory given  Plan Discussed with: CRNA  Anesthesia Plan Comments:         Anesthesia Quick Evaluation

## 2014-12-02 NOTE — ED Notes (Signed)
Unable to obtain consent because patient has questions for surgeon about surgery.

## 2014-12-02 NOTE — Anesthesia Postprocedure Evaluation (Signed)
  Anesthesia Post-op Note  Patient: Ana Long  Procedure(s) Performed: Procedure(s) (LRB): LAPAROSCOPIC CHOLECYSTECTOMY (N/A)  Patient Location: PACU  Anesthesia Type: General  Level of Consciousness: awake and alert   Airway and Oxygen Therapy: Patient Spontanous Breathing  Post-op Pain: mild  Post-op Assessment: Post-op Vital signs reviewed, Patient's Cardiovascular Status Stable, Respiratory Function Stable, Patent Airway and No signs of Nausea or vomiting. To stepdown unit per plan. Patient not on hormonal birth control.  Last Vitals:  Filed Vitals:   12/02/14 1830  BP: 142/81  Pulse: 105  Temp:   Resp: 25    Post-op Vital Signs: stable   Complications: No apparent anesthesia complications

## 2014-12-02 NOTE — Progress Notes (Signed)
CRITICAL VALUE ALERT  Critical value received pcr mrsa positive  Date of notification:  12-02-2014  Time of notification:  1406  Critical value read back:Yes.    Nurse who received alert:  p Aslan Himes rn  MD notified (1st page):   Time of first page:  1  MD notified (2nd page):  Time of second page:  Responding MD:  Dr Harlow Asa (on Unit)  Time MD responded:  1407

## 2014-12-02 NOTE — ED Provider Notes (Signed)
CSN: [id number]     Arrival date & time 12/01/14  2132 History  By signing my name below, I, Stephania Fragmin, attest that this documentation has been prepared under the direction and in the presence of Johni Narine, MD. Electronically Signed: Stephania Fragmin, ED Scribe. 12/02/2014. 2:13 AM.   Chief Complaint  Patient presents with  . Abdominal Pain  . Multiple Complaints    Patient is a 39 y.o. female presenting with abdominal pain. The history is provided by the patient. No language interpreter was used.  Abdominal Pain Pain location:  Generalized Pain quality: aching   Pain quality: not bloating   Pain radiates to:  Does not radiate Pain severity:  Severe Onset quality:  Gradual Duration:  3 days Timing:  Constant Progression:  Unchanged Chronicity:  New Context: not alcohol use   Relieved by:  Nothing Worsened by:  Nothing tried Ineffective treatments:  OTC medications and liquids (tea, ginger ale) Associated symptoms: chills, nausea and vomiting   Associated symptoms: no diarrhea, no dysuria and no fever  Cough: slight.   Risk factors: no alcohol abuse and no recent hospitalization    HPI Comments: Renada A Dolinsky-Ward is a 39 y.o. female with a history of DM, GERD, and sleep apnea, who presents to the Emergency Department complaining of constant, severe, aching abdominal pain radiating to her back for 3 days. Associated symptoms include nausea, 6 episodes of vomiting, loss of appetite, and hot and cold chills. She also endorses a slight cough,  difficulty urinating, and dark urine. Patient denies any sick contact, although she reports she has children in the house in pre-school. Patient has tried Maalox, tea, and ginger ale for her pain with no relief. She denies a history of any abdominal surgeries. She also denies diarrhea, fever, dysuria, or rhinorrhea.   Past Medical History  Diagnosis Date  . GERD (gastroesophageal reflux disease)   . Diabetes mellitus     intolerant to  Metformin  . Herpes genital   . PCOS (polycystic ovarian syndrome)   . Morbid obesity with BMI of 50 to 60 07/23/2009  . Dry skin     hands   . Asthma     prn inhaler  . Arthritis     major joints  . Complication of anesthesia     states is hard to wake up post-op  . Family history of anesthesia complication     mother went into coma during c-section  . Sleep apnea, obstructive     uses CPAP nightly - had sleep study 08/22/2007  . Carpal tunnel syndrome of right wrist 09/2012  . Obesity hypoventilation syndrome ([place]) 10/30/2012  . OSA on CPAP      AHI was on  11-17-12 to 120/hr, titrated to 15 cm with 3 cm EPR/   . Seasonal allergies   . Obesity, morbid ([place]) 01/10/2014   Past Surgical History  Procedure Laterality Date  . Hysteroscopy w/d&c  01-08-2002  . Carpal tunnel release Right 10/02/2012    Procedure: RIGHT CARPAL TUNNEL RELEASE ENDOSCOPIC;  Surgeon: Jolyn Nap, MD;  Location: [facility];  Service: Orthopedics;  Laterality: Right;   Family History  Problem Relation Age of Onset  . Other Mother   . Diabetes Maternal Aunt   . Heart attack Maternal Aunt   . Alzheimer's disease Maternal Uncle   . Cancer Maternal Uncle     prostate  . Diabetes Paternal Aunt   . Cancer Paternal Aunt     brain,mouth  .  Alzheimer's disease Paternal Aunt   . Diabetes Paternal Uncle   . Diabetes Maternal Grandmother   . Diabetes Maternal Grandfather   . Leukemia Cousin   . Other Other   . Obesity Other   . Other Other    Social History  Substance Use Topics  . Smoking status: Former Smoker -- 10 years    Quit date: 05/24/2010  . Smokeless tobacco: Never Used  . Alcohol Use: 0.0 oz/week    0 Standard drinks or equivalent per week     Comment: rarely   OB History    Gravida Para Term Preterm AB TAB SAB Ectopic Multiple Living   0              Review of Systems  Constitutional: Positive for chills and appetite change. Negative for fever.  HENT: Negative for  rhinorrhea.   Respiratory: Cough: slight.   Gastrointestinal: Positive for nausea, vomiting and abdominal pain. Negative for diarrhea.  Genitourinary: Positive for difficulty urinating. Negative for dysuria.       Positive for dark urine  All other systems reviewed and are negative.  Allergies  Citrus; Latex; Penicillins; Soap; Tomato; Doxycycline; and Metformin and related  Home Medications   Prior to Admission medications   Medication Sig Start Date End Date Taking? Authorizing Provider  acyclovir (ZOVIRAX) 400 MG tablet TAKE ONE TABLET BY MOUTH TWICE DAILY 11/22/14  Yes Francesca Oman, DO  albuterol (VENTOLIN HFA) 108 (90 BASE) MCG/ACT inhaler Inhale 1-2 puffs into the lungs every 4 (four) hours as needed for wheezing or shortness of breath. 07/25/13  Yes Francesca Oman, DO  atorvastatin (LIPITOR) 10 MG tablet Take 1 tablet (10 mg total) by mouth daily. 07/24/14  Yes Alex Ronnie Derby, DO  glipiZIDE (GLUCOTROL) 5 MG tablet Take 1 tablet (5 mg total) by mouth daily before breakfast. 09/19/14  Yes Francesca Oman, DO  Insulin Glargine (LANTUS SOLOSTAR) 100 UNIT/ML Solostar Pen Inject 20 Units into the skin every morning. 11/20/14  Yes Alex Ronnie Derby, DO  loratadine (CLARITIN) 10 MG tablet Take 1 tablet (10 mg total) by mouth 2 (two) times daily. 07/09/14  Yes Karlene Einstein, MD  phentermine (ADIPEX-P) 37.5 MG tablet Take 37.5 mg by mouth 2 times daily at 12 noon and 4 pm. Takes half tablet twice a day   Yes Historical Provider, MD  Probiotic Product (PROBIOTIC DAILY PO) Take 2 capsules by mouth every evening.    Yes Historical Provider, MD  ranitidine (ZANTAC) 150 MG tablet Take 150 mg by mouth 2 (two) times daily.   Yes Historical Provider, MD  senna (SENOKOT) 8.6 MG TABS tablet Take 2 tablets by mouth at bedtime.   Yes Historical Provider, MD  topiramate (TOPAMAX) 25 MG tablet Take 25 mg by mouth 2 (two) times daily.   Yes Historical Provider, MD  triamcinolone (NASACORT AQ) 55 MCG/ACT AERO nasal inhaler  Place 2 sprays into the nose daily. 07/27/13  Yes Lucious Groves, DO  ACCU-CHEK FASTCLIX LANCETS MISC Inject 1 each into the skin QID. use for testing blood sugar 2 to 4 times daily. 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 Insulin dependent Patient not taking: Reported on 12/02/2014 03/20/14   Francesca Oman, DO  chlorpheniramine-HYDROcodone (TUSSIONEX) 10-8 MG/5ML LQCR Take 5 mLs by mouth every 12 (twelve) hours as needed for cough. Patient not taking: Reported on 12/02/2014 07/27/13   Lucious Groves, DO  glucose blood (ACCU-CHEK SMARTVIEW) test strip Use to test blood glucose up to 3 times  daily.   Dx:E11.65 Patient not taking: Reported on 12/02/2014 01/09/14   Francesca Oman, DO  Hypromellose 0.3 % SOLN Apply 1 drop to eye 3 (three) times daily as needed. Patient not taking: Reported on 07/24/2014 07/09/14   Karlene Einstein, MD  Insulin Pen Needle 31G X 5 MM MISC Use to inject insulin into the skin 2 times daily. 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 Insulin dependent Patient not taking: Reported on 12/02/2014 03/20/14   Francesca Oman, DO   BP 148/82 mmHg  Pulse 114  Temp(Src) 98.4 F (36.9 C) (Oral)  Resp 20  Ht 5' 2"  (1.575 m)  Wt 274 lb (124.286 kg)  BMI 50.10 kg/m2  SpO2 100%  LMP 11/19/2014 Physical Exam  Constitutional: She is oriented to person, place, and time. She appears well-developed and well-nourished. No distress.  HENT:  Head: Normocephalic and atraumatic.  Mouth/Throat: Oropharynx is clear and moist. No oropharyngeal exudate.  Moist mm.  Eyes: EOM are normal. Pupils are equal, round, and reactive to light.  Neck: Normal range of motion. Neck supple.  Cardiovascular: Normal rate, regular rhythm and normal heart sounds.   Pulmonary/Chest: Effort normal and breath sounds normal. No respiratory distress. She has no wheezes. She has no rales.  Lungs are clear to auscultation.   Abdominal: Soft. Bowel sounds are normal. She exhibits no distension and no mass. There is tenderness. There is no rebound and no guarding.  Mild  general tenderness  Musculoskeletal: Normal range of motion.  Neurological: She is alert and oriented to person, place, and time. She has normal reflexes. No cranial nerve deficit.  Skin: Skin is warm and dry.  Psychiatric: She has a normal mood and affect.  Nursing note and vitals reviewed.   ED Course  Procedures (including critical care time)  DIAGNOSTIC STUDIES: Oxygen Saturation is 100% on RA, normal by my interpretation.    COORDINATION OF CARE: 1:37 AM - Discussed treatment plan with pt at bedside. Pt verbalized understanding and agreed to plan.    Labs Review Labs Reviewed  LIPASE, BLOOD - Abnormal; Notable for the following:    Lipase 16 (*)    All other components within normal limits  COMPREHENSIVE METABOLIC PANEL - Abnormal; Notable for the following:    Glucose, Bld 209 (*)    Total Protein 8.3 (*)    AST 12 (*)    All other components within normal limits  CBC - Abnormal; Notable for the following:    WBC 15.0 (*)    All other components within normal limits  URINALYSIS, ROUTINE W REFLEX MICROSCOPIC (NOT AT [facility]) - Abnormal; Notable for the following:    Color, Urine AMBER (*)    APPearance CLOUDY (*)    Bilirubin Urine SMALL (*)    Ketones, ur >80 (*)    Protein, ur 30 (*)    Leukocytes, UA TRACE (*)    All other components within normal limits  URINE MICROSCOPIC-ADD ON   MDM   Final diagnoses:  None   Results for orders placed or performed during the hospital encounter of 12/01/14  Lipase, blood  Result Value Ref Range   Lipase 16 (L) 22 - 51 U/L  Comprehensive metabolic panel  Result Value Ref Range   Sodium 135 135 - 145 mmol/L   Potassium 3.8 3.5 - 5.1 mmol/L   Chloride 101 101 - 111 mmol/L   CO2 24 22 - 32 mmol/L   Glucose, Bld 209 (H) 65 - 99 mg/dL   BUN 6  6 - 20 mg/dL   Creatinine, Ser 0.56 0.44 - 1.00 mg/dL   Calcium 9.1 8.9 - 10.3 mg/dL   Total Protein 8.3 (H) 6.5 - 8.1 g/dL   Albumin 3.8 3.5 - 5.0 g/dL   AST 12 (L) 15 - 41 U/L    ALT 14 14 - 54 U/L   Alkaline Phosphatase 49 38 - 126 U/L   Total Bilirubin 1.2 0.3 - 1.2 mg/dL   GFR calc non Af Amer >60 >60 mL/min   GFR calc Af Amer >60 >60 mL/min   Anion gap 10 5 - 15  CBC  Result Value Ref Range   WBC 15.0 (H) 4.0 - 10.5 K/uL   RBC 4.70 3.87 - 5.11 MIL/uL   Hemoglobin 12.4 12.0 - 15.0 g/dL   HCT 37.7 36.0 - 46.0 %   MCV 80.2 78.0 - 100.0 fL   MCH 26.4 26.0 - 34.0 pg   MCHC 32.9 30.0 - 36.0 g/dL   RDW 14.0 11.5 - 15.5 %   Platelets 345 150 - 400 K/uL  Urinalysis, Routine w reflex microscopic (not at [facility])  Result Value Ref Range   Color, Urine AMBER (A) YELLOW   APPearance CLOUDY (A) CLEAR   Specific Gravity, Urine 1.026 1.005 - 1.030   pH 6.0 5.0 - 8.0   Glucose, UA NEGATIVE NEGATIVE mg/dL   Hgb urine dipstick NEGATIVE NEGATIVE   Bilirubin Urine SMALL (A) NEGATIVE   Ketones, ur >80 (A) NEGATIVE mg/dL   Protein, ur 30 (A) NEGATIVE mg/dL   Urobilinogen, UA 1.0 0.0 - 1.0 mg/dL   Nitrite NEGATIVE NEGATIVE   Leukocytes, UA TRACE (A) NEGATIVE  Urine microscopic-add on  Result Value Ref Range   Squamous Epithelial / LPF MANY (A) RARE   WBC, UA 7-10 <3 WBC/hpf   RBC / HPF 0-2 <3 RBC/hpf   Bacteria, UA MANY (A) RARE  Pregnancy, urine  Result Value Ref Range   Preg Test, Ur NEGATIVE NEGATIVE   Dg Abd Acute W/chest  12/02/2014   CLINICAL DATA:  Upper bilateral abdominal pain radiating to the back for 3 days. Nausea and vomiting.  EXAM: DG ABDOMEN ACUTE W/ 1V CHEST  COMPARISON:  Chest 07/25/2013  FINDINGS: Normal heart size and pulmonary vascularity. No focal airspace disease or consolidation in the lungs. No blunting of costophrenic angles. No pneumothorax. Mediastinal contours appear intact.  Scattered gas and stool in the colon. No small or large bowel distention. No free intra-abdominal air. No abnormal air-fluid levels. No radiopaque stones. Visualized bones appear intact.  IMPRESSION: Negative abdominal radiographs.  No acute cardiopulmonary disease.    Electronically Signed   By: Lucienne Capers M.D.   On: 12/02/2014 03:25   Ct Renal Stone Study  12/02/2014   CLINICAL DATA:  Mid abdominal pain radiating to the left flank for 3 days. Nausea and vomiting. Constipation. Difficulty urinating. Dark urine.  EXAM: CT ABDOMEN AND PELVIS WITHOUT CONTRAST  TECHNIQUE: Multidetector CT imaging of the abdomen and pelvis was performed following the standard protocol without IV contrast.  COMPARISON:  CT pelvis 07/01/2009  FINDINGS: Lung bases are clear.  Kidneys are symmetrical in size and shape. No hydronephrosis or hydroureter. No renal, ureteral, or bladder stones. Bladder wall is not thickened.  The gallbladder is distended with slightly increased density suggesting sludge. There is evidence of gallbladder wall thickening with infiltration in the fat around the gallbladder. Appearance is consistent with acute cholecystitis. Mild reactive inflammation in the splenic flexure of the colon.  The  unenhanced appearance of the liver, spleen, pancreas, adrenal glands, abdominal aorta, inferior vena cava, and retroperitoneal lymph nodes is unremarkable. Stomach, small bowel, and colon are decompressed. Scattered stool in the colon. Small umbilical/ periumbilical hernia containing fat. No free air or free fluid in the abdomen.  Pelvis: The appendix is normal. Uterus and ovaries are not enlarged. Small amount of free fluid in the pelvis is likely physiologic. No pelvic mass or lymphadenopathy. Degenerative changes in the lumbar spine. No destructive bone lesions.  IMPRESSION: Inflammatory changes in the gallbladder as described consistent with acute cholecystitis. No renal or ureteral stone or obstruction.   Electronically Signed   By: Lucienne Capers M.D.   On: 12/02/2014 06:08    NPO  Case d/w Dr. Lucia Gaskins, surgery to see patient  I, Gwynneth Fabio-RASCH,Maryfer Tauzin K, personally performed the services described in this documentation. All medical record entries made by the scribe were  at my direction and in my presence.  I have reviewed the chart and discharge instructions and agree that the record reflects my personal performance and is accurate and complete. Delcia Spitzley-RASCH,Krisandra Bueno K.  12/02/2014. 2:33 AM.      Mikeila Burgen, MD 12/02/14 2395

## 2014-12-02 NOTE — ED Notes (Signed)
Pt alert and oriented x4. Respirations even and unlabored, bilateral symmetrical rise and fall of chest. Skin warm and dry. In no acute distress. Denies needs.   

## 2014-12-02 NOTE — Interval H&P Note (Signed)
History and Physical Interval Note:  12/02/2014 2:46 PM  Ana Long  has presented today for surgery, with the diagnosis of cholelithiasis, acute cholecystitis.  The various methods of treatment have been discussed with the patient and family. After consideration of risks, benefits and other options for treatment, the patient has consented to    Procedure(s): LAPAROSCOPIC CHOLECYSTECTOMY WITH INTRAOPERATIVE CHOLANGIOGRAM (N/A) as a surgical intervention .    The patient's history has been reviewed, patient examined, no change in status, stable for surgery.  I have reviewed the patient's chart and labs.  Questions were answered to the patient's satisfaction.    [name], MD, [facility] Office: [place]

## 2014-12-02 NOTE — Progress Notes (Signed)
Patient has her own Bipap Nasal pillows. RT placed h20 in the humidifier and patient placed herself on her unit because she was feeling very sleepy

## 2014-12-02 NOTE — Transfer of Care (Signed)
Immediate Anesthesia Transfer of Care Note  Patient: Ana Long  Procedure(s) Performed: Procedure(s): LAPAROSCOPIC CHOLECYSTECTOMY (N/A)  Patient Location: PACU  Anesthesia Type:General  Level of Consciousness: awake, alert , oriented and patient cooperative  Airway & Oxygen Therapy: Patient Spontanous Breathing and Patient connected to face mask oxygen  Post-op Assessment: Report given to RN and Post -op Vital signs reviewed and unstable, Anesthesiologist notified  Post vital signs: stable  Last Vitals:  Filed Vitals:   12/02/14 1734  BP: 144/77  Pulse: 108  Temp: 36.9 C  Resp: 28    Complications: No apparent anesthesia complications

## 2014-12-03 ENCOUNTER — Encounter (HOSPITAL_COMMUNITY): Payer: Self-pay | Admitting: Surgery

## 2014-12-03 LAB — COMPREHENSIVE METABOLIC PANEL
ALT: 33 U/L (ref 14–54)
ANION GAP: 8 (ref 5–15)
AST: 38 U/L (ref 15–41)
Albumin: 3 g/dL — ABNORMAL LOW (ref 3.5–5.0)
Alkaline Phosphatase: 47 U/L (ref 38–126)
BUN: 11 mg/dL (ref 6–20)
CHLORIDE: 106 mmol/L (ref 101–111)
CO2: 24 mmol/L (ref 22–32)
Calcium: 8 mg/dL — ABNORMAL LOW (ref 8.9–10.3)
Creatinine, Ser: 1.64 mg/dL — ABNORMAL HIGH (ref 0.44–1.00)
GFR calc Af Amer: 44 mL/min — ABNORMAL LOW (ref >60)
GFR, EST NON AFRICAN AMERICAN: 38 mL/min — AB (ref >60)
Glucose, Bld: 190 mg/dL — ABNORMAL HIGH (ref 65–99)
POTASSIUM: 4 mmol/L (ref 3.5–5.1)
Sodium: 138 mmol/L (ref 135–145)
Total Bilirubin: 1.1 mg/dL (ref 0.3–1.2)
Total Protein: 6.6 g/dL (ref 6.5–8.1)

## 2014-12-03 LAB — GLUCOSE, CAPILLARY
GLUCOSE-CAPILLARY: 163 mg/dL — AB (ref 65–99)
Glucose-Capillary: 195 mg/dL — ABNORMAL HIGH (ref 65–99)
Glucose-Capillary: 169 mg/dL — ABNORMAL HIGH (ref 65–99)
Glucose-Capillary: 275 mg/dL — ABNORMAL HIGH (ref 65–99)
Glucose-Capillary: 167 mg/dL — ABNORMAL HIGH (ref 65–99)

## 2014-12-03 LAB — CBC
HEMATOCRIT: 32.6 % — AB (ref 36.0–46.0)
HEMOGLOBIN: 10.6 g/dL — AB (ref 12.0–15.0)
MCH: 26.3 pg (ref 26.0–34.0)
MCHC: 32.5 g/dL (ref 30.0–36.0)
MCV: 80.9 fL (ref 78.0–100.0)
Platelets: 275 10*3/uL (ref 150–400)
RBC: 4.03 MIL/uL (ref 3.87–5.11)
RDW: 14.4 % (ref 11.5–15.5)
WBC: 12.3 10*3/uL — ABNORMAL HIGH (ref 4.0–10.5)

## 2014-12-03 MED ORDER — SODIUM CHLORIDE 0.9 % IV SOLN: INTRAVENOUS | Status: DC

## 2014-12-03 MED ORDER — HYDROMORPHONE HCL 1 MG/ML IJ SOLN
0.5000 mg | INTRAMUSCULAR | Status: DC | PRN
  Administered 2014-12-03 (×2): 1 mg via INTRAVENOUS
  Filled 2014-12-03: qty 2
  Filled 2014-12-03 (×2): qty 1

## 2014-12-03 MED ORDER — CHLORHEXIDINE GLUCONATE CLOTH 2 % EX PADS
6.0000 | MEDICATED_PAD | Freq: Every day | CUTANEOUS | Status: DC
  Administered 2014-12-03 – 2014-12-06 (×4): 6 via TOPICAL

## 2014-12-03 MED ORDER — ENOXAPARIN SODIUM 40 MG/0.4ML ~~LOC~~ SOLN
40.0000 mg | SUBCUTANEOUS | Status: DC
  Administered 2014-12-03 – 2014-12-08 (×6): 40 mg via SUBCUTANEOUS
  Filled 2014-12-03 (×8): qty 0.4

## 2014-12-03 MED ORDER — SODIUM CHLORIDE 0.9 % IV SOLN
INTRAVENOUS | Status: DC
  Administered 2014-12-03: 18:00:00 via INTRAVENOUS
  Administered 2014-12-03: 125 mL/h via INTRAVENOUS
  Administered 2014-12-04: 08:00:00 via INTRAVENOUS

## 2014-12-03 MED ORDER — SODIUM CHLORIDE 0.9 % IV BOLUS (SEPSIS)
500.0000 mL | Freq: Once | INTRAVENOUS | Status: AC
  Administered 2014-12-03: 500 mL via INTRAVENOUS

## 2014-12-03 MED ORDER — VITAMINS A & D EX OINT
TOPICAL_OINTMENT | CUTANEOUS | Status: AC
  Administered 2014-12-03: 1
  Filled 2014-12-03: qty 5

## 2014-12-03 MED ORDER — CHLORHEXIDINE GLUCONATE CLOTH 2 % EX PADS: 6.0000 | MEDICATED_PAD | Freq: Every day | CUTANEOUS | Status: DC

## 2014-12-03 MED ORDER — ACETAMINOPHEN 650 MG RE SUPP: 650.0000 mg | Freq: Four times a day (QID) | RECTAL | Status: DC | PRN

## 2014-12-03 MED ORDER — OXYCODONE HCL 5 MG PO TABS
5.0000 mg | ORAL_TABLET | ORAL | Status: DC | PRN
  Administered 2014-12-03 (×2): 10 mg via ORAL
  Administered 2014-12-03: 5 mg via ORAL
  Administered 2014-12-04 – 2014-12-06 (×5): 10 mg via ORAL
  Administered 2014-12-06 – 2014-12-07 (×2): 5 mg via ORAL
  Administered 2014-12-07 (×2): 10 mg via ORAL
  Administered 2014-12-08 (×3): 5 mg via ORAL
  Administered 2014-12-09: 10 mg via ORAL
  Administered 2014-12-09 (×2): 5 mg via ORAL
  Administered 2014-12-10: 10 mg via ORAL
  Filled 2014-12-03 (×3): qty 1
  Filled 2014-12-03 (×2): qty 2
  Filled 2014-12-03: qty 1
  Filled 2014-12-03 (×2): qty 2
  Filled 2014-12-03: qty 1
  Filled 2014-12-03 (×5): qty 2
  Filled 2014-12-03: qty 1
  Filled 2014-12-03: qty 2
  Filled 2014-12-03: qty 1
  Filled 2014-12-03: qty 2
  Filled 2014-12-03: qty 1
  Filled 2014-12-03 (×2): qty 2

## 2014-12-03 MED ORDER — ACETAMINOPHEN 325 MG PO TABS
325.0000 mg | ORAL_TABLET | Freq: Four times a day (QID) | ORAL | Status: DC | PRN
  Administered 2014-12-05: 650 mg via ORAL
  Administered 2014-12-06: 325 mg via ORAL
  Administered 2014-12-07: 650 mg via ORAL
  Administered 2014-12-07: 325 mg via ORAL
  Administered 2014-12-07: 650 mg via ORAL
  Administered 2014-12-08 (×3): 325 mg via ORAL
  Administered 2014-12-09 – 2014-12-10 (×2): 650 mg via ORAL
  Filled 2014-12-03 (×5): qty 2
  Filled 2014-12-03 (×2): qty 1
  Filled 2014-12-03: qty 2
  Filled 2014-12-03: qty 1
  Filled 2014-12-03: qty 2
  Filled 2014-12-03: qty 1

## 2014-12-03 NOTE — Progress Notes (Signed)
Patient ID: Ana Long, female   DOB: December 10, 1975, 39 y.o.   MRN: [id number] 1 Day Post-Op  Subjective: Pt feels fairly well.  Wanting/needing to keep her cpap on because she feels SOB if she takes it off.  No nausea.  Pain well controlled.  Objective: Vital signs in last 24 hours: Temp:  [98.2 F (36.8 C)-100.7 F (38.2 C)] 98.9 F (37.2 C) (10/11 0400) Pulse Rate:  [58-129] 112 (10/11 0400) Resp:  [17-32] 26 (10/11 0400) BP: (118-162)/(63-85) 129/69 mmHg (10/11 0400) SpO2:  [69 %-100 %] 96 % (10/11 0400) Last BM Date: 11/29/14  Intake/Output from previous day: 10/10 0701 - 10/11 0700 In: 3300 [I.V.:3100; IV Piggyback:200] Out: 800 [Urine:300; Blood:100] Intake/Output this shift:    PE: Abd: soft, appropriately tender in RUQ, +BS, obese, incisions c/d/i, JP drain with minimal serosang output Heart: regular, but mildly tachy Lungs: CTAB, on CPAP  Lab Results:   Recent Labs  12/02/14 1105 12/03/14 0353  WBC 14.6* 12.3*  HGB 11.8* 10.6*  HCT 35.9* 32.6*  PLT 286 275   BMET  Recent Labs  12/01/14 2344 12/02/14 1105 12/03/14 0353  NA 135  --  138  K 3.8  --  4.0  CL 101  --  106  CO2 24  --  24  GLUCOSE 209*  --  190*  BUN 6  --  11  CREATININE 0.56 0.58 1.64*  CALCIUM 9.1  --  8.0*   PT/INR No results for input(s): LABPROT, INR in the last 72 hours. CMP     Component Value Date/Time   NA 138 12/03/2014 0353   K 4.0 12/03/2014 0353   CL 106 12/03/2014 0353   CO2 24 12/03/2014 0353   GLUCOSE 190* 12/03/2014 0353   BUN 11 12/03/2014 0353   CREATININE 1.64* 12/03/2014 0353   CREATININE 0.60 07/24/2014 1550   CALCIUM 8.0* 12/03/2014 0353   PROT 6.6 12/03/2014 0353   ALBUMIN 3.0* 12/03/2014 0353   AST 38 12/03/2014 0353   ALT 33 12/03/2014 0353   ALKPHOS 47 12/03/2014 0353   BILITOT 1.1 12/03/2014 0353   GFRNONAA 38* 12/03/2014 0353   GFRNONAA >89 07/24/2014 1550   GFRAA 44* 12/03/2014 0353   GFRAA >89 07/24/2014 1550   Lipase      Component Value Date/Time   LIPASE 16* 12/01/2014 2344       Studies/Results: Dg Abd Acute W/chest  12/02/2014   CLINICAL DATA:  Upper bilateral abdominal pain radiating to the back for 3 days. Nausea and vomiting.  EXAM: DG ABDOMEN ACUTE W/ 1V CHEST  COMPARISON:  Chest 07/25/2013  FINDINGS: Normal heart size and pulmonary vascularity. No focal airspace disease or consolidation in the lungs. No blunting of costophrenic angles. No pneumothorax. Mediastinal contours appear intact.  Scattered gas and stool in the colon. No small or large bowel distention. No free intra-abdominal air. No abnormal air-fluid levels. No radiopaque stones. Visualized bones appear intact.  IMPRESSION: Negative abdominal radiographs.  No acute cardiopulmonary disease.   Electronically Signed   By: Lucienne Capers M.D.   On: 12/02/2014 03:25   Ct Renal Stone Study  12/02/2014   CLINICAL DATA:  Mid abdominal pain radiating to the left flank for 3 days. Nausea and vomiting. Constipation. Difficulty urinating. Dark urine.  EXAM: CT ABDOMEN AND PELVIS WITHOUT CONTRAST  TECHNIQUE: Multidetector CT imaging of the abdomen and pelvis was performed following the standard protocol without IV contrast.  COMPARISON:  CT pelvis 07/01/2009  FINDINGS: Lung bases are  clear.  Kidneys are symmetrical in size and shape. No hydronephrosis or hydroureter. No renal, ureteral, or bladder stones. Bladder wall is not thickened.  The gallbladder is distended with slightly increased density suggesting sludge. There is evidence of gallbladder wall thickening with infiltration in the fat around the gallbladder. Appearance is consistent with acute cholecystitis. Mild reactive inflammation in the splenic flexure of the colon.  The unenhanced appearance of the liver, spleen, pancreas, adrenal glands, abdominal aorta, inferior vena cava, and retroperitoneal lymph nodes is unremarkable. Stomach, small bowel, and colon are decompressed. Scattered stool in the  colon. Small umbilical/ periumbilical hernia containing fat. No free air or free fluid in the abdomen.  Pelvis: The appendix is normal. Uterus and ovaries are not enlarged. Small amount of free fluid in the pelvis is likely physiologic. No pelvic mass or lymphadenopathy. Degenerative changes in the lumbar spine. No destructive bone lesions.  IMPRESSION: Inflammatory changes in the gallbladder as described consistent with acute cholecystitis. No renal or ureteral stone or obstruction.   Electronically Signed   By: Lucienne Capers M.D.   On: 12/02/2014 06:08    Anti-infectives: Anti-infectives    Start     Dose/Rate Route Frequency Ordered Stop   12/02/14 2000  ciprofloxacin (CIPRO) IVPB 400 mg     400 mg 200 mL/hr over 60 Minutes Intravenous Every 12 hours 12/02/14 1034     12/02/14 1500  vancomycin (VANCOCIN) 1,500 mg in sodium chloride 0.9 % 500 mL IVPB     1,500 mg 250 mL/hr over 120 Minutes Intravenous  Once 12/02/14 1447 12/02/14 1556   12/02/14 1200  acyclovir (ZOVIRAX) tablet 400 mg  Status:  Discontinued     400 mg Oral 2 times daily 12/02/14 1034 12/02/14 1830   12/02/14 0645  ciprofloxacin (CIPRO) IVPB 400 mg     400 mg 200 mL/hr over 60 Minutes Intravenous  Once 12/02/14 0641 12/02/14 1001   12/02/14 0645  metroNIDAZOLE (FLAGYL) IVPB 500 mg     500 mg 100 mL/hr over 60 Minutes Intravenous  Once 12/02/14 0641 12/02/14 0901       Assessment/Plan  POD 1, s/p lap chole for acute cholecystitis -will advance to carb mod diet -cont JP drain -mobilize and pulm toilet -Cipro D2 DM -cont SSI, when eating solid diet can transition to home meds OSA/hypoventilation syndrome -on CPAP right now.  Patient feels like she is SOB when moving around and is wanting to wear her CPAP at all times.  Would like to transition to  today if able.  Appreciate CCM assistance and will defer further pulmonary care to them -IS q 1 hr AKI -Creatinine bumped to 1.64.  600cc of UOP overnight.  Cont IVFs  for hydration DVT Prophylaxis SCDs/Lovenox Dispo -to the floor PT to help mobilize  LOS: 1 day    Ephrata Verville E 12/03/2014, 7:37 AM Pager: [phone]

## 2014-12-03 NOTE — Progress Notes (Signed)
Pt has home CPAP machine and has stated that she will self administer when she is ready for bed.  RT to monitor and assess as needed.

## 2014-12-03 NOTE — Progress Notes (Signed)
Handoff report called to [place], Therapist, sports. Patient transferring to room 1525 via wheelchair.

## 2014-12-03 NOTE — Progress Notes (Signed)
Name: Ana Long MRN: [id number] DOB: [birth year]    ADMISSION DATE:  12/01/2014 CONSULTATION DATE:  10/10  REFERRING MD :  Harlow Asa   CHIEF COMPLAINT:  Preop clearance   BRIEF PATIENT DESCRIPTION:  39 year old [name] presents to the ER 10/10 w/ cc abd pain, nausea and vomiting. In ER found to have WBC ct 15K, CT c/w acute cholecystitis. She was admitted by surgical services. Has h/o asthma, obesity and OSA, therefore PCCM was asked to eval for pre-op clearance.   SIGNIFICANT EVENTS    STUDIES:  CT abd 10/10: Inflammatory changes in the gallbladder as described consistent with acute cholecystitis. No renal or ureteral stone or obstruction.   HISTORY OF PRESENT ILLNESS:   See above    SUBJECTIVE:  Pt reports SOB when taking CPAP off and continues to be short of breath on 4L [place]. C/o 5/10 abdominal pain. Able to eat breakfast and sit up in bed with no difficulties   VITAL SIGNS: Temp:  [98.2 F (36.8 C)-100.7 F (38.2 C)] 98.9 F (37.2 C) (10/11 0400) Pulse Rate:  [58-117] 107 (10/11 0800) Resp:  [17-32] 26 (10/11 0900) BP: (115-162)/(68-85) 129/71 mmHg (10/11 0800) SpO2:  [69 %-100 %] 97 % (10/11 0900) Room air  PHYSICAL EXAMINATION: General:  Obese aaf resting comfortably in bed on CPAP Neuro:  Awake, alert, no focal def  HEENT:  NCAT, no JVD  Cardiovascular:  RRR, no murmurs Lungs:  CTA bilaterally  Abdomen:  Soft, tender to palpation, post-op dressings in place. JP drain draining small amount of serosanguineous fluid   Musculoskeletal:  Intact  Skin:  Intact    Recent Labs Lab 12/01/14 2344 12/02/14 1105 12/03/14 0353  NA 135  --  138  K 3.8  --  4.0  CL 101  --  106  CO2 24  --  24  BUN 6  --  11  CREATININE 0.56 0.58 1.64*  GLUCOSE 209*  --  190*    Recent Labs Lab 12/01/14 2344 12/02/14 1105 12/03/14 0353  HGB 12.4 11.8* 10.6*  HCT 37.7 35.9* 32.6*  WBC 15.0* 14.6* 12.3*  PLT 345 286 275   Dg Abd Acute W/chest  12/02/2014    CLINICAL DATA:  Upper bilateral abdominal pain radiating to the back for 3 days. Nausea and vomiting.  EXAM: DG ABDOMEN ACUTE W/ 1V CHEST  COMPARISON:  Chest 07/25/2013  FINDINGS: Normal heart size and pulmonary vascularity. No focal airspace disease or consolidation in the lungs. No blunting of costophrenic angles. No pneumothorax. Mediastinal contours appear intact.  Scattered gas and stool in the colon. No small or large bowel distention. No free intra-abdominal air. No abnormal air-fluid levels. No radiopaque stones. Visualized bones appear intact.  IMPRESSION: Negative abdominal radiographs.  No acute cardiopulmonary disease.   Electronically Signed   By: Lucienne Capers M.D.   On: 12/02/2014 03:25   Ct Renal Stone Study  12/02/2014   CLINICAL DATA:  Mid abdominal pain radiating to the left flank for 3 days. Nausea and vomiting. Constipation. Difficulty urinating. Dark urine.  EXAM: CT ABDOMEN AND PELVIS WITHOUT CONTRAST  TECHNIQUE: Multidetector CT imaging of the abdomen and pelvis was performed following the standard protocol without IV contrast.  COMPARISON:  CT pelvis 07/01/2009  FINDINGS: Lung bases are clear.  Kidneys are symmetrical in size and shape. No hydronephrosis or hydroureter. No renal, ureteral, or bladder stones. Bladder wall is not thickened.  The gallbladder is distended with slightly increased density suggesting sludge. There  is evidence of gallbladder wall thickening with infiltration in the fat around the gallbladder. Appearance is consistent with acute cholecystitis. Mild reactive inflammation in the splenic flexure of the colon.  The unenhanced appearance of the liver, spleen, pancreas, adrenal glands, abdominal aorta, inferior vena cava, and retroperitoneal lymph nodes is unremarkable. Stomach, small bowel, and colon are decompressed. Scattered stool in the colon. Small umbilical/ periumbilical hernia containing fat. No free air or free fluid in the abdomen.  Pelvis: The appendix  is normal. Uterus and ovaries are not enlarged. Small amount of free fluid in the pelvis is likely physiologic. No pelvic mass or lymphadenopathy. Degenerative changes in the lumbar spine. No destructive bone lesions.  IMPRESSION: Inflammatory changes in the gallbladder as described consistent with acute cholecystitis. No renal or ureteral stone or obstruction.   Electronically Signed   By: Lucienne Capers M.D.   On: 12/02/2014 06:08    ASSESSMENT / PLAN:  OSA (CPAP compliant) Asthma (currently controlled) Would be moderate risk from pulmonary stand-point for post-op respiratory failure as a consequence of hypoventilation, airway obstruction or bronchospasm. Plan Continue home CPAP throughout day today and mandatory at night  Wean to [place] as tolerated  PRN SABA IS  Mobilize   S/p cholecystectomy for Acute cholecystitis  Plan Per surgical services  DM Plan ssi   Erick Colace ACNP-BC [place] Pager # [phone] OR # [phone] if no answer  12/03/2014, 9:45 AM   Attending Note:  I have examined patient, reviewed labs, studies and notes. I have discussed the case with Jerrye Bushy, and I agree with the data and plans as amended above. Will plan for CPAP 15 (home device) qhs and prn during the day especially when she is sleeping. She will need repeat PFT at some point in the future after this acute hospital;ization. Agree with tx to the floor.    Baltazar Apo, MD, PhD 12/03/2014, 11:17 AM [facility] [phone] or if no answer [phone]

## 2014-12-03 NOTE — Evaluation (Signed)
Physical Therapy Evaluation Patient Details Name: Ana Long MRN: [id number] DOB: Apr 13, 1975 Today's Date: 12/03/2014   History of Present Illness  39 year old [name] presents to the ER 10/10 w/ cc abd pain, nausea and vomiting. In ER found to have WBC ct 15K, CT c/w acute cholecystitis. She was admitted by surgical services. Has h/o asthma, obesity and OSA. S/P Lap chole 12/02/14.  Clinical Impression  Pt admitted with above diagnosis. Pt currently with functional limitations due to the deficits listed below (see PT Problem List). Pt required max assist for bed mobility, min/guard assist to walk 150' with RW. SaO2 95% on RA walking, HR 125. Good progress expected.  Pt will benefit from skilled PT to increase their independence and safety with mobility to allow discharge to the venue listed below.       Follow Up Recommendations No PT follow up    Equipment Recommendations  None recommended by PT    Recommendations for Other Services       Precautions / Restrictions Precautions Precaution Comments: abdominal surgery Restrictions Weight Bearing Restrictions: No      Mobility  Bed Mobility Overal bed mobility: +2 for physical assistance;Needs Assistance Bed Mobility: Rolling;Sidelying to Sit Rolling: Max assist;+2 for physical assistance Sidelying to sit: Max assist;+2 for physical assistance       General bed mobility comments: verbal cues for technique, bed flat, assist to initiate roll and to raise up trunk  Transfers Overall transfer level: Needs assistance Equipment used: None Transfers: Sit to/from Stand;Stand Pivot Transfers Sit to Stand: Min guard Stand pivot transfers: Min guard       General transfer comment: min/guard for safety  Ambulation/Gait Ambulation/Gait assistance: Min guard Ambulation Distance (Feet): 160 Feet Assistive device: Rolling walker (2 wheeled) Gait Pattern/deviations: WFL(Within Functional Limits) Gait velocity: decr Gait  velocity interpretation: Below normal speed for age/gender General Gait Details: steady with RW, HR 125 with walking, SaO2 96% on RA walking, distance limited by fatigue  Stairs            Wheelchair Mobility    Modified Rankin (Stroke Patients Only)       Balance Overall balance assessment: Modified Independent                                           Pertinent Vitals/Pain Pain Assessment: 0-10 Pain Score: 6  Pain Location: abdominal incision Pain Descriptors / Indicators: Sore Pain Intervention(s): Repositioned;Relaxation;Utilized relaxation techniques (instructed in abdominal bracing with pillow, declined pain meds)    Home Living Family/patient expects to be discharged to:: Private residence Living Arrangements: Children Available Help at Discharge: Family;Available PRN/intermittently   Home Access: Stairs to enter   Entrance Stairs-Number of Steps: 3 Home Layout: Able to live on main level with bedroom/bathroom;Multi-level Home Equipment: Walker - 2 wheels;Cane - single point      Prior Function Level of Independence: Independent         Comments: works as a Engineer, drilling, 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, newspaper        Extremity/Trunk Assessment   Upper Extremity Assessment: Overall WFL for tasks assessed           Lower Extremity Assessment: Overall WFL for tasks assessed      Cervical / Trunk Assessment: Normal  Communication   Communication: No difficulties  Cognition  Arousal/Alertness: Awake/alert Behavior During Therapy: WFL for tasks assessed/performed Overall Cognitive Status: Within Functional Limits for tasks assessed                      General Comments      Exercises        Assessment/Plan    PT Assessment Patient needs continued PT services  PT Diagnosis Acute pain;Difficulty walking   PT Problem List Decreased activity tolerance;Decreased mobility;Pain  PT  Treatment Interventions Gait training;Stair training;Therapeutic activities;Therapeutic exercise;Patient/family education   PT Goals (Current goals can be found in the Care Plan section) Acute Rehab PT Goals Patient Stated Goal: return to working out at gym, return to work PT Goal Formulation: With patient Time For Goal Achievement: 12/17/14 Potential to Achieve Goals: Good    Frequency Min 3X/week   Barriers to discharge        Co-evaluation               End of Session   Activity Tolerance: Patient tolerated treatment well;No increased pain Patient left: in chair;with call bell/phone within reach;with nursing/sitter in room           Time: 1130-1201 PT Time Calculation (min) (ACUTE ONLY): 31 min   Charges:   PT Evaluation $Initial PT Evaluation Tier I: 1 Procedure PT Treatments $Gait Training: 8-22 mins   PT G Codes:        Philomena Doheny 12/03/2014, 12:35 PM [phone]

## 2014-12-03 NOTE — Progress Notes (Signed)
Urine output 150 for eight hours.  Notified Dr. Harlow Asa. 500 ml normal saline bolus administered.

## 2014-12-04 DIAGNOSIS — N179 Acute kidney failure, unspecified: Secondary | ICD-10-CM

## 2014-12-04 DIAGNOSIS — T508X5A Adverse effect of diagnostic agents, initial encounter: Secondary | ICD-10-CM

## 2014-12-04 DIAGNOSIS — N141 Nephropathy induced by other drugs, medicaments and biological substances: Secondary | ICD-10-CM

## 2014-12-04 LAB — URINALYSIS, ROUTINE W REFLEX MICROSCOPIC
Bilirubin Urine: NEGATIVE
Glucose, UA: NEGATIVE mg/dL
Ketones, ur: NEGATIVE mg/dL
LEUKOCYTES UA: NEGATIVE
NITRITE: NEGATIVE
PROTEIN: NEGATIVE mg/dL
Specific Gravity, Urine: 1.007 (ref 1.005–1.030)
UROBILINOGEN UA: 0.2 mg/dL (ref 0.0–1.0)
pH: 5.5 (ref 5.0–8.0)

## 2014-12-04 LAB — URINE MICROSCOPIC-ADD ON

## 2014-12-04 LAB — CBC
HCT: 30.6 % — ABNORMAL LOW (ref 36.0–46.0)
Hemoglobin: 10 g/dL — ABNORMAL LOW (ref 12.0–15.0)
MCH: 26.3 pg (ref 26.0–34.0)
MCHC: 32.7 g/dL (ref 30.0–36.0)
MCV: 80.5 fL (ref 78.0–100.0)
PLATELETS: 271 10*3/uL (ref 150–400)
RBC: 3.8 MIL/uL — AB (ref 3.87–5.11)
RDW: 14.6 % (ref 11.5–15.5)
WBC: 9.4 10*3/uL (ref 4.0–10.5)

## 2014-12-04 LAB — BASIC METABOLIC PANEL
Anion gap: 10 (ref 5–15)
BUN: 17 mg/dL (ref 6–20)
CALCIUM: 7.9 mg/dL — AB (ref 8.9–10.3)
CO2: 22 mmol/L (ref 22–32)
CREATININE: 3.31 mg/dL — AB (ref 0.44–1.00)
Chloride: 102 mmol/L (ref 101–111)
GFR calc non Af Amer: 16 mL/min — ABNORMAL LOW (ref >60)
GFR, EST AFRICAN AMERICAN: 19 mL/min — AB (ref >60)
Glucose, Bld: 187 mg/dL — ABNORMAL HIGH (ref 65–99)
Potassium: 4.3 mmol/L (ref 3.5–5.1)
SODIUM: 134 mmol/L — AB (ref 135–145)

## 2014-12-04 LAB — GLUCOSE, CAPILLARY
GLUCOSE-CAPILLARY: 180 mg/dL — AB (ref 65–99)
GLUCOSE-CAPILLARY: 191 mg/dL — AB (ref 65–99)
GLUCOSE-CAPILLARY: 145 mg/dL — AB (ref 65–99)
Glucose-Capillary: 160 mg/dL — ABNORMAL HIGH (ref 65–99)

## 2014-12-04 LAB — CREATININE, URINE, RANDOM: CREATININE, URINE: 60.82 mg/dL

## 2014-12-04 LAB — OSMOLALITY, URINE: OSMOLALITY UR: 125 mosm/kg — AB (ref 390–1090)

## 2014-12-04 LAB — SODIUM, URINE, RANDOM: SODIUM UR: 21 mmol/L

## 2014-12-04 MED ORDER — ALBUTEROL SULFATE (2.5 MG/3ML) 0.083% IN NEBU
2.5000 mg | INHALATION_SOLUTION | Freq: Four times a day (QID) | RESPIRATORY_TRACT | Status: DC
  Administered 2014-12-04 – 2014-12-06 (×8): 2.5 mg via RESPIRATORY_TRACT
  Filled 2014-12-04 (×10): qty 3

## 2014-12-04 MED ORDER — CIPROFLOXACIN IN D5W 400 MG/200ML IV SOLN
400.0000 mg | INTRAVENOUS | Status: DC
  Filled 2014-12-04: qty 200

## 2014-12-04 MED ORDER — DIPHENHYDRAMINE HCL 12.5 MG/5ML PO ELIX
12.5000 mg | ORAL_SOLUTION | Freq: Four times a day (QID) | ORAL | Status: DC | PRN
  Administered 2014-12-04 – 2014-12-10 (×7): 12.5 mg via ORAL
  Filled 2014-12-04 (×7): qty 5

## 2014-12-04 MED ORDER — ONDANSETRON HCL 4 MG/2ML IJ SOLN
4.0000 mg | Freq: Four times a day (QID) | INTRAMUSCULAR | Status: DC | PRN
  Administered 2014-12-04: 4 mg via INTRAVENOUS
  Filled 2014-12-04 (×2): qty 2

## 2014-12-04 MED ORDER — SODIUM CHLORIDE 0.9 % IV BOLUS (SEPSIS)
1000.0000 mL | Freq: Once | INTRAVENOUS | Status: AC
  Administered 2014-12-04: 1000 mL via INTRAVENOUS

## 2014-12-04 MED ORDER — PROMETHAZINE HCL 25 MG/ML IJ SOLN: 6.2500 mg | Freq: Four times a day (QID) | INTRAMUSCULAR | Status: DC | PRN

## 2014-12-04 MED ORDER — ALUM & MAG HYDROXIDE-SIMETH 200-200-20 MG/5ML PO SUSP
30.0000 mL | Freq: Once | ORAL | Status: AC
  Administered 2014-12-04: 30 mL via ORAL
  Filled 2014-12-04: qty 30

## 2014-12-04 NOTE — Progress Notes (Signed)
2 Days Post-Op  Subjective: Having nausea and trying to vomit this AM, Creatinine is up, she feels bad.  Drain is clear serous fluid, port sites look fine.  Objective: Vital signs in last 24 hours: Temp:  [98.7 F (37.1 C)-99.8 F (37.7 C)] 98.7 F (37.1 C) (10/12 0511) Pulse Rate:  [102-125] 102 (10/12 0511) Resp:  [5-28] 18 (10/12 0511) BP: (111-144)/(56-81) 122/57 mmHg (10/12 0511) SpO2:  [91 %-100 %] 96 % (10/12 0904) Last BM Date: 11/29/14 600 PO  Carb modified diet Urine 950 recorded yesterday 35 from the drain Afebrile, VSS, she remains tachycardic Creatinine is up to 3.31, Her Glucose is up CBC is stable/WBC improving, H/H is stable  Intake/Output from previous day: 10/11 0701 - 10/12 0700 In: 3900.8 [P.O.:600; I.V.:2595.8; IV Piggyback:700] Out: 985 [Urine:950; Drains:35] Intake/Output this shift:    General appearance: alert, cooperative, no distress and nauseated and just vomited a small amount Resp: clear to auscultation bilaterally GI: soft very sore, drain is clear, sites all look fine + BS  Lab Results:   Recent Labs  12/03/14 0353 12/04/14 0525  WBC 12.3* 9.4  HGB 10.6* 10.0*  HCT 32.6* 30.6*  PLT 275 271    BMET  Recent Labs  12/03/14 0353 12/04/14 0525  NA 138 134*  K 4.0 4.3  CL 106 102  CO2 24 22  GLUCOSE 190* 187*  BUN 11 17  CREATININE 1.64* 3.31*  CALCIUM 8.0* 7.9*   PT/INR No results for input(s): LABPROT, INR in the last 72 hours.   Recent Labs Lab 12/01/14 2344 12/03/14 0353  AST 12* 38  ALT 14 33  ALKPHOS 49 47  BILITOT 1.2 1.1  PROT 8.3* 6.6  ALBUMIN 3.8 3.0*     Lipase     Component Value Date/Time   LIPASE 16* 12/01/2014 2344     Studies/Results: No results found.  Medications: . Chlorhexidine Gluconate Cloth  6 each Topical Q0600  . ciprofloxacin  400 mg Intravenous Q12H  . enoxaparin (LOVENOX) injection  40 mg Subcutaneous Q24H  . insulin aspart  0-15 Units Subcutaneous TID WC  . mupirocin  ointment  1 application Nasal BID  . topiramate  25 mg Oral BID  . triamcinolone  2 spray Nasal Daily   . sodium chloride 125 mL/hr at 12/04/14 0801   Prior to Admission medications   Medication Sig Start Date End Date Taking? Authorizing Provider  acyclovir (ZOVIRAX) 400 MG tablet TAKE ONE TABLET BY MOUTH TWICE DAILY 11/22/14  Yes Francesca Oman, DO  albuterol (VENTOLIN HFA) 108 (90 BASE) MCG/ACT inhaler Inhale 1-2 puffs into the lungs every 4 (four) hours as needed for wheezing or shortness of breath. 07/25/13  Yes Francesca Oman, DO  atorvastatin (LIPITOR) 10 MG tablet Take 1 tablet (10 mg total) by mouth daily. 07/24/14  Yes Alex Ronnie Derby, DO  glipiZIDE (GLUCOTROL) 5 MG tablet Take 1 tablet (5 mg total) by mouth daily before breakfast. 09/19/14  Yes Francesca Oman, DO  Insulin Glargine (LANTUS SOLOSTAR) 100 UNIT/ML Solostar Pen Inject 20 Units into the skin every morning. 11/20/14  Yes Alex Ronnie Derby, DO  loratadine (CLARITIN) 10 MG tablet Take 1 tablet (10 mg total) by mouth 2 (two) times daily. 07/09/14  Yes Karlene Einstein, MD  phentermine (ADIPEX-P) 37.5 MG tablet Take 37.5 mg by mouth 2 times daily at 12 noon and 4 pm. Takes half tablet twice a day   Yes Historical Provider, MD  Probiotic Product (PROBIOTIC DAILY  PO) Take 2 capsules by mouth every evening.    Yes Historical Provider, MD  ranitidine (ZANTAC) 150 MG tablet Take 150 mg by mouth 2 (two) times daily.   Yes Historical Provider, MD  senna (SENOKOT) 8.6 MG TABS tablet Take 2 tablets by mouth at bedtime.   Yes Historical Provider, MD  topiramate (TOPAMAX) 25 MG tablet Take 25 mg by mouth 2 (two) times daily.   Yes Historical Provider, MD  triamcinolone (NASACORT AQ) 55 MCG/ACT AERO nasal inhaler Place 2 sprays into the nose daily. 07/27/13  Yes Lucious Groves, DO  albuterol (PROAIR HFA) 108 (90 BASE) MCG/ACT inhaler Inhale 2 puffs into the lungs every 4 (four) hours as needed for wheezing or shortness of breath (cough).    Historical Provider,  MD  famotidine (PEPCID) 20 MG tablet Take 20 mg by mouth at bedtime.    Historical Provider, MD  insulin glargine (LANTUS) 100 UNIT/ML injection Inject 40 Units into the skin at bedtime.    Historical Provider, MD  TURMERIC PO Take 2 tablets by mouth at bedtime. Takes generic turmeric    Historical Provider, MD    Assessment/Plan Acute cholecystitis, cholelithiasis S/p laparoscopic cholecystectomy, drain placement, 12/02/14 Dr. Harlow Asa Morbid obesity with Body mass index is 50.1 OSA/Hypoventilation syndrome/CPAP AODM Acute  renal failure Antibiotics:  Day 3 Cipro, pharmacy adjusting dose secondary to renal function. DVT: Lovenox/SCD  Plan:  I have called Medicine to see, I will give her a fluid bolus, she did have a decreased urine output yesterday that responded to increased fluids.  I will leave the drain in, she says she is voiding well now.  She is 5 liters positive if you believe I/O.  I will check weight, I have ordered some Zofran, and will back her diet down some.    LOS: 2 days    Yadir Zentner 12/04/2014

## 2014-12-04 NOTE — Consult Note (Signed)
[facility] Medical Consultation  Ana Long WJX:[id number] DOB: 17-Aug-1975 DOA: 12/01/2014 PCP: Duwaine Maxin, DO   Requesting physician: Dr. Tera Helper Date of consultation: 10.12.2016 Reason for consultation: AKI  Impression/Recommendations Acute cholecystitis Status post laparoscopic cholecystectomy on 12/02/2014 Further management per surgery.  OSA (obstructive sleep apnea) Continue BiPAP overnight further management per pulmonary  AKI (acute kidney injury) ([place]): - She has not had any recent contrast, she relates she'll use ibuprofen at home but spending more than 10 days since her last dose of ibuprofen, she has not been hypotensive throughout her hospital stay, she is on no nephrotoxic agents like ACE inhibitor or ARB's. - He did receive 1 dose of IV vancomycin 12/02/2014. - She has had good urine output. - We'll go ahead and check a UA, urinary sodium and urinary creatinine, also check a urine osmolarity. She denies any joint pains or rashes and no leukocytosis, which makes acute interstitial nephritis unlikely. Avoid NSAID's, PIs and sulfas. Continue IV hydration and strict I's and O's and recheck a basic metabolic panel in the morning. - Cipro has less than 1% chance of causing acute renal failure seems unlikely as her BUN is not increasing.  I will followup again tomorrow. Please contact me if I can be of assistance in the meanwhile. Thank you for this consultation.  Chief Complaint: Nauseated  HPI:  39 year old female who presented to the ED on 12/02/2014 of abdominal pain and was diagnosed with acute cholecystitis surgery performed at day. She had a CT scan of the kidneys without contrast on 12/02/2014 that showed no obstructions but compatible with acute cholecystitis. She has not been hypotensive throughout her hospital stay, she is on no nephrotoxic drugs or new drugs. She relates she consumes ibuprofen during her menstrual period her last menstrual  period was 10 days prior to admission. He denies any history of vasculitis.  Review of Systems:  Constitutional:  No weight loss, night sweats, Fevers, chills, fatigue.  HEENT:  No headaches, Difficulty swallowing,Tooth/dental problems,Sore throat,  No sneezing, itching, ear ache, nasal congestion, post nasal drip,  Cardio-vascular:  No chest pain, Orthopnea, PND, swelling in lower extremities, anasarca, dizziness, palpitations  GI:  No heartburn, indigestion, abdominal pain, nausea, vomiting, diarrhea, change in bowel habits, loss of appetite  Resp:  No shortness of breath with exertion or at rest. No excess mucus, no productive cough, No non-productive cough, No coughing up of blood.No change in color of mucus.No wheezing.No chest wall deformity  Skin:  no rash or lesions.  GU:  no dysuria, change in color of urine, no urgency or frequency. No flank pain.  Musculoskeletal:  No joint pain or swelling. No decreased range of motion. No back pain.  Psych:  No change in mood or affect. No depression or anxiety. No memory loss.   Past Medical History  Diagnosis Date  . GERD (gastroesophageal reflux disease)   . Diabetes mellitus     intolerant to Metformin  . Herpes genital   . PCOS (polycystic ovarian syndrome)   . Morbid obesity with BMI of 50 to 60 07/23/2009  . Dry skin     hands   . Asthma     prn inhaler  . Arthritis     major joints  . Complication of anesthesia     states is hard to wake up post-op  . Family history of anesthesia complication     mother went into coma during c-section  . Sleep apnea, obstructive     uses CPAP  nightly - had sleep study 08/22/2007  . Carpal tunnel syndrome of right wrist 09/2012  . Obesity hypoventilation syndrome ([place]) 10/30/2012  . OSA on CPAP      AHI was on  11-17-12 to 120/hr, titrated to 15 cm with 3 cm EPR/   . Seasonal allergies   . Obesity, morbid ([place]) 01/10/2014  . Rheumatoid arthritis [facility])    Past Surgical History   Procedure Laterality Date  . Hysteroscopy w/d&c  01-08-2002  . Carpal tunnel release Right 10/02/2012    Procedure: RIGHT CARPAL TUNNEL RELEASE ENDOSCOPIC;  Surgeon: Jolyn Nap, MD;  Location: [facility];  Service: Orthopedics;  Laterality: Right;  . Cholecystectomy N/A 12/02/2014    Procedure: LAPAROSCOPIC CHOLECYSTECTOMY;  Surgeon: Armandina Gemma, MD;  Location: [facility];  Service: General;  Laterality: N/A;   Social History:  reports that she quit smoking about 4 years ago. She has never used smokeless tobacco. She reports that she drinks alcohol. She reports that she does not use illicit drugs.  Allergies  Allergen Reactions  . Citrus Hives and Itching  . Latex Swelling  . Penicillins Hives and Itching  . Soap Hives, Itching and Swelling    PUREX LAUNDRY DETERGENT  . Tomato Hives and Itching  . Fluticasone     Per patient, makes sneeze and gag  . Other Itching    Chocolate, Bananas, Acidic foods  . Shellfish Allergy   . Doxycycline Other (See Comments)    AFFECTS JOINTS  . Metformin And Related Rash    GI UPSET   Family History  Problem Relation Age of Onset  . Other Mother   . Diabetes Maternal Aunt   . Heart attack Maternal Aunt   . Alzheimer's disease Maternal Uncle   . Cancer Maternal Uncle     prostate  . Diabetes Paternal Aunt   . Cancer Paternal Aunt     brain,mouth  . Alzheimer's disease Paternal Aunt   . Diabetes Paternal Uncle   . Diabetes Maternal Grandmother   . Diabetes Maternal Grandfather   . Leukemia Cousin   . Other Other   . Obesity Other   . Other Other     Prior to Admission medications   Medication Sig Start Date End Date Taking? Authorizing Provider  acyclovir (ZOVIRAX) 400 MG tablet TAKE ONE TABLET BY MOUTH TWICE DAILY 11/22/14  Yes Francesca Oman, DO  albuterol (VENTOLIN HFA) 108 (90 BASE) MCG/ACT inhaler Inhale 1-2 puffs into the lungs every 4 (four) hours as needed for wheezing or shortness of breath. 07/25/13  Yes Francesca Oman, DO  atorvastatin (LIPITOR) 10 MG tablet Take 1 tablet (10 mg total) by mouth daily. 07/24/14  Yes Alex Ronnie Derby, DO  glipiZIDE (GLUCOTROL) 5 MG tablet Take 1 tablet (5 mg total) by mouth daily before breakfast. 09/19/14  Yes Francesca Oman, DO  Insulin Glargine (LANTUS SOLOSTAR) 100 UNIT/ML Solostar Pen Inject 20 Units into the skin every morning. 11/20/14  Yes Alex Ronnie Derby, DO  loratadine (CLARITIN) 10 MG tablet Take 1 tablet (10 mg total) by mouth 2 (two) times daily. 07/09/14  Yes Karlene Einstein, MD  phentermine (ADIPEX-P) 37.5 MG tablet Take 37.5 mg by mouth 2 times daily at 12 noon and 4 pm. Takes half tablet twice a day   Yes Historical Provider, MD  Probiotic Product (PROBIOTIC DAILY PO) Take 2 capsules by mouth every evening.    Yes Historical Provider, MD  ranitidine (ZANTAC) 150 MG tablet Take  150 mg by mouth 2 (two) times daily.   Yes Historical Provider, MD  senna (SENOKOT) 8.6 MG TABS tablet Take 2 tablets by mouth at bedtime.   Yes Historical Provider, MD  topiramate (TOPAMAX) 25 MG tablet Take 25 mg by mouth 2 (two) times daily.   Yes Historical Provider, MD  triamcinolone (NASACORT AQ) 55 MCG/ACT AERO nasal inhaler Place 2 sprays into the nose daily. 07/27/13  Yes Lucious Groves, DO  albuterol (PROAIR HFA) 108 (90 BASE) MCG/ACT inhaler Inhale 2 puffs into the lungs every 4 (four) hours as needed for wheezing or shortness of breath (cough).    Historical Provider, MD  famotidine (PEPCID) 20 MG tablet Take 20 mg by mouth at bedtime.    Historical Provider, MD  insulin glargine (LANTUS) 100 UNIT/ML injection Inject 40 Units into the skin at bedtime.    Historical Provider, MD  TURMERIC PO Take 2 tablets by mouth at bedtime. Takes generic turmeric    Historical Provider, MD   Physical Exam: Blood pressure 122/57, pulse 102, temperature 98.7 F (37.1 C), temperature source Oral, resp. rate 18, height 5' 2"  (1.575 m), weight 133.3 kg (293 lb 14 oz), last menstrual period 11/19/2014,  SpO2 96 %. Filed Vitals:   12/04/14 0511  BP: 122/57  Pulse: 102  Temp: 98.7 F (37.1 C)  Resp: 18     General:  She is awake alert and oriented 3  Eyes: Anicteric no pallor  ENT: Moist mucous membranes  Neck: No JVD  Cardiovascular: Regular rate and rhythm with positive S1-S2  Respiratory: Good air movement is auscultation  Abdomen: Soft nontender with a bandage on her right upper quadrant  Skin: No rashes or ulceration  Musculoskeletal: Intact  Psychiatric: Appropriate  Neurologic: Nonfocal  Labs on Admission:  Basic Metabolic Panel:  Recent Labs Lab 12/01/14 2344 12/02/14 1105 12/03/14 0353 12/04/14 0525  NA 135  --  138 134*  K 3.8  --  4.0 4.3  CL 101  --  106 102  CO2 24  --  24 22  GLUCOSE 209*  --  190* 187*  BUN 6  --  11 17  CREATININE 0.56 0.58 1.64* 3.31*  CALCIUM 9.1  --  8.0* 7.9*   Liver Function Tests:  Recent Labs Lab 12/01/14 2344 12/03/14 0353  AST 12* 38  ALT 14 33  ALKPHOS 49 47  BILITOT 1.2 1.1  PROT 8.3* 6.6  ALBUMIN 3.8 3.0*    Recent Labs Lab 12/01/14 2344  LIPASE 16*   No results for input(s): AMMONIA in the last 168 hours. CBC:  Recent Labs Lab 12/01/14 2344 12/02/14 1105 12/03/14 0353 12/04/14 0525  WBC 15.0* 14.6* 12.3* 9.4  HGB 12.4 11.8* 10.6* 10.0*  HCT 37.7 35.9* 32.6* 30.6*  MCV 80.2 81.0 80.9 80.5  PLT 345 286 275 271   Cardiac Enzymes: No results for input(s): CKTOTAL, CKMB, CKMBINDEX, TROPONINI in the last 168 hours. BNP: Invalid input(s): POCBNP CBG:  Recent Labs Lab 12/03/14 1207 12/03/14 1352 12/03/14 1634 12/03/14 2306 12/04/14 0815  GLUCAP 169* 163* 275* 167* 180*    Radiological Exams on Admission: No results found.  EKG: Independently reviewed. Sinus tachycardia with a normal axis with T-wave inversions in leads 2 and 3  Time spent: 70 min  Charlynne Cousins [facility] Pager [phone]  If 7PM-7AM, please contact night-coverage [email or web address] Password  [email or web address] 12/04/2014, 12:08 PM

## 2014-12-04 NOTE — Progress Notes (Signed)
Physical Therapy Treatment Patient Details Name: Ana Long MRN: [id number] DOB: 30-Dec-1975 Today's Date: 12/04/2014    History of Present Illness 39 year old [name] presents to the ER 10/10 w/ cc abd pain, nausea and vomiting. In ER found to have WBC ct 15K, CT c/w acute cholecystitis. She was admitted by surgical services. Has h/o asthma, obesity and OSA. S/P Lap chole 12/02/14.    PT Comments    Pt had a bad morning, feeling better now.  Tolerated increased amb distance using walker for slight stability.    Follow Up Recommendations  No PT follow up     Equipment Recommendations  None recommended by PT    Recommendations for Other Services       Precautions / Restrictions Precautions Precaution Comments: abdominal surgery Restrictions Weight Bearing Restrictions: No    Mobility  Bed Mobility Overal bed mobility: Modified Independent             General bed mobility comments: increased time  Transfers Overall transfer level: Needs assistance Equipment used: None   Sit to Stand: Supervision;Min guard         General transfer comment: min/guard for safety.,...good use of hands to steady self  Ambulation/Gait Ambulation/Gait assistance: Supervision;Min guard Ambulation Distance (Feet): 350 Feet Assistive device: Rolling walker (2 wheeled) Gait Pattern/deviations: Step-through pattern;Decreased stride length;Trunk flexed Gait velocity: WFL   General Gait Details: mild use of walker to steady self due to recent ABD surgery   Stairs            Wheelchair Mobility    Modified Rankin (Stroke Patients Only)       Balance                                    Cognition Arousal/Alertness: Awake/alert Behavior During Therapy: WFL for tasks assessed/performed Overall Cognitive Status: Within Functional Limits for tasks assessed                      Exercises      General Comments        Pertinent  Vitals/Pain Pain Assessment: Faces Faces Pain Scale: Hurts little more Pain Location: ABD incision Pain Descriptors / Indicators: Sore;Tender Pain Intervention(s): Monitored during session;Repositioned    Home Living                      Prior Function            PT Goals (current goals can now be found in the care plan section) Progress towards PT goals: Progressing toward goals    Frequency  Min 3X/week    PT Plan      Co-evaluation             End of Session Equipment Utilized During Treatment: Gait belt Activity Tolerance: Patient tolerated treatment well Patient left: in bed;with call bell/phone within reach     Time: 1452-1518 PT Time Calculation (min) (ACUTE ONLY): 26 min  Charges:  $Gait Training: 8-22 mins $Therapeutic Activity: 8-22 mins                    G Codes:      Ana Long  PTA [facility]  Acute  Rehab Pager      [phone]

## 2014-12-04 NOTE — Progress Notes (Signed)
   Name: Ana Long MRN: [id number] DOB: [birth year]    ADMISSION DATE:  12/01/2014 CONSULTATION DATE:  10/10  REFERRING MD :  Harlow Asa   CHIEF COMPLAINT:  Preop clearance   BRIEF PATIENT DESCRIPTION:  39 year old [name] presents to the ER 10/10 w/ cc abd pain, nausea and vomiting. In ER found to have WBC ct 15K, CT c/w acute cholecystitis. She was admitted by surgical services. Has h/o asthma, obesity and OSA, therefore PCCM was asked to eval for pre-op clearance.   SIGNIFICANT EVENTS    STUDIES:  CT abd 10/10: Inflammatory changes in the gallbladder as described consistent with acute cholecystitis. No renal or ureteral stone or obstruction.  HISTORY OF PRESENT ILLNESS:   See above   SUBJECTIVE:  Pt now on RA with minimal SOB. Pt states she has needed her PRN albuterol this AM due to feeling "tight" in chest, but otherwise does not feel like she needs her CPAP. C/o nausea and abdominal pain.   VITAL SIGNS: Temp:  [98.7 F (37.1 C)-99.8 F (37.7 C)] 98.7 F (37.1 C) (10/12 0511) Pulse Rate:  [102-125] 102 (10/12 0511) Resp:  [5-23] 18 (10/12 0511) BP: (113-144)/(56-81) 122/57 mmHg (10/12 0511) SpO2:  [91 %-100 %] 96 % (10/12 0909) Room air  PHYSICAL EXAMINATION: General:  Obese aaf sitting in chair on room air  Neuro:  Awake, alert, no focal def   HEENT:  NCAT, no JVD  Cardiovascular:  RRR Lungs:  CTA bilaterally  Abdomen:  Soft, tender to palpation, post-op dressings in place. JP drain with minimal amt of serous fluid   Musculoskeletal:  Intact  Skin:  Intact    Recent Labs Lab 12/01/14 2344 12/02/14 1105 12/03/14 0353 12/04/14 0525  NA 135  --  138 134*  K 3.8  --  4.0 4.3  CL 101  --  106 102  CO2 24  --  24 22  BUN 6  --  11 17  CREATININE 0.56 0.58 1.64* 3.31*  GLUCOSE 209*  --  190* 187*    Recent Labs Lab 12/02/14 1105 12/03/14 0353 12/04/14 0525  HGB 11.8* 10.6* 10.0*  HCT 35.9* 32.6* 30.6*  WBC 14.6* 12.3* 9.4  PLT 286 275 271    No results found.  ASSESSMENT / PLAN:  OSA (CPAP compliant) Asthma Plan CPAP QHS (home settings)   PRN SABA IS  Mobilize    S/p cholecystectomy for Acute cholecystitis  Plan Per surgical services  DM Plan ssi   Acute renal failure Plan:  Agree with IM consult and management   PCCM to sign off, call if we can help.   Erick Colace ACNP-BC [place] Pager # [phone] OR # [phone] if no answer 12/04/2014, 10:10 AM   Attending Note:  I have examined patient, reviewed labs, studies and notes. I have discussed the case with Jerrye Bushy, and I agree with the data and plans as amended above.   Baltazar Apo, MD, PhD 12/04/2014, 12:43 PM [facility] [phone] or if no answer [phone]

## 2014-12-05 ENCOUNTER — Inpatient Hospital Stay (HOSPITAL_COMMUNITY): Payer: Medicaid Other

## 2014-12-05 DIAGNOSIS — R0600 Dyspnea, unspecified: Secondary | ICD-10-CM

## 2014-12-05 LAB — CBC
HCT: 27.6 % — ABNORMAL LOW (ref 36.0–46.0)
Hemoglobin: 9.1 g/dL — ABNORMAL LOW (ref 12.0–15.0)
MCH: 26.3 pg (ref 26.0–34.0)
MCHC: 33 g/dL (ref 30.0–36.0)
MCV: 79.8 fL (ref 78.0–100.0)
PLATELETS: 274 10*3/uL (ref 150–400)
RBC: 3.46 MIL/uL — ABNORMAL LOW (ref 3.87–5.11)
RDW: 14.6 % (ref 11.5–15.5)
WBC: 7.1 10*3/uL (ref 4.0–10.5)

## 2014-12-05 LAB — COMPREHENSIVE METABOLIC PANEL
ALT: 24 U/L (ref 14–54)
ANION GAP: 9 (ref 5–15)
AST: 22 U/L (ref 15–41)
Albumin: 2.9 g/dL — ABNORMAL LOW (ref 3.5–5.0)
Alkaline Phosphatase: 48 U/L (ref 38–126)
BUN: 18 mg/dL (ref 6–20)
CHLORIDE: 105 mmol/L (ref 101–111)
CO2: 21 mmol/L — ABNORMAL LOW (ref 22–32)
Calcium: 7.9 mg/dL — ABNORMAL LOW (ref 8.9–10.3)
Creatinine, Ser: 3.43 mg/dL — ABNORMAL HIGH (ref 0.44–1.00)
GFR, EST AFRICAN AMERICAN: 18 mL/min — AB (ref >60)
GFR, EST NON AFRICAN AMERICAN: 16 mL/min — AB (ref >60)
Glucose, Bld: 187 mg/dL — ABNORMAL HIGH (ref 65–99)
POTASSIUM: 3.8 mmol/L (ref 3.5–5.1)
Sodium: 135 mmol/L (ref 135–145)
Total Bilirubin: 0.6 mg/dL (ref 0.3–1.2)
Total Protein: 6.5 g/dL (ref 6.5–8.1)

## 2014-12-05 LAB — GLUCOSE, CAPILLARY
GLUCOSE-CAPILLARY: 160 mg/dL — AB (ref 65–99)
GLUCOSE-CAPILLARY: 202 mg/dL — AB (ref 65–99)
GLUCOSE-CAPILLARY: 151 mg/dL — AB (ref 65–99)

## 2014-12-05 LAB — URINE CULTURE
CULTURE: NO GROWTH
SPECIAL REQUESTS: NORMAL

## 2014-12-05 LAB — HEMOGLOBIN A1C
HEMOGLOBIN A1C: 7.9 % — AB (ref 4.8–5.6)
Mean Plasma Glucose: 180 mg/dL

## 2014-12-05 MED ORDER — SODIUM CHLORIDE 0.9 % IV SOLN
INTRAVENOUS | Status: DC
  Administered 2014-12-05 (×2): via INTRAVENOUS

## 2014-12-05 MED ORDER — SENNOSIDES-DOCUSATE SODIUM 8.6-50 MG PO TABS: 2.0000 | ORAL_TABLET | Freq: Every evening | ORAL | Status: DC | PRN

## 2014-12-05 MED ORDER — FAMOTIDINE 20 MG PO TABS
20.0000 mg | ORAL_TABLET | Freq: Two times a day (BID) | ORAL | Status: DC
  Administered 2014-12-05 – 2014-12-10 (×11): 20 mg via ORAL
  Filled 2014-12-05 (×12): qty 1

## 2014-12-05 MED ORDER — ACYCLOVIR 200 MG PO CAPS
400.0000 mg | ORAL_CAPSULE | Freq: Two times a day (BID) | ORAL | Status: DC
  Administered 2014-12-05 – 2014-12-10 (×11): 400 mg via ORAL
  Filled 2014-12-05 (×13): qty 2

## 2014-12-05 MED ORDER — CIPROFLOXACIN HCL 500 MG PO TABS
500.0000 mg | ORAL_TABLET | Freq: Two times a day (BID) | ORAL | Status: DC
  Administered 2014-12-05 – 2014-12-06 (×3): 500 mg via ORAL
  Filled 2014-12-05 (×5): qty 1

## 2014-12-05 NOTE — Progress Notes (Signed)
[facility] PROGRESS NOTE    Progress Note   [name] ENI:[id number] DOB: 1975/07/03 DOA: 12/01/2014 PCP: Duwaine Maxin, DO   Brief Narrative:   Ana Long is an 39 y.o. female status post laparoscopic cholecystectomy were consulted for acute kidney injury.  Assessment/Plan:   Acute cholecystitis - Further management per surgery.  OSA (obstructive sleep apnea) - Continue BiPAP at night.  AKI (acute kidney injury) ([place]) Was checked and are IV fluids was less than 1%.  Compatible with prerenal azotemia. I will continue IV fluid she is having good urine output, her creatinine has plateaued, I have encouraged her to continue to consume fluids. Continue to monitor strict I's and O's recheck a basic metabolic panel in the morning.    DVT Prophylaxis - Lovenox ordered.  Family Communication: none Disposition Plan: Home when stable. Code Status:     Code Status Orders        Start     Ordered   12/02/14 1831  Full code   Continuous     12/02/14 1830    Advance Directive Documentation        Most Recent Value   Type of Advance Directive  Healthcare Power of Attorney   Pre-existing out of facility DNR order (yellow form or pink MOST form)     "MOST" Form in Place?          IV Access:    Peripheral IV   Procedures and diagnostic studies:   No results found.   Medical Consultants:    None.  Anti-Infectives:   Anti-infectives    Start     Dose/Rate Route Frequency Ordered Stop   12/05/14 1200  ciprofloxacin (CIPRO) tablet 500 mg     500 mg Oral 2 times daily 12/05/14 1114     12/05/14 1200  acyclovir (ZOVIRAX) 200 MG capsule 400 mg     400 mg Oral 2 times daily 12/05/14 1116     12/05/14 0800  ciprofloxacin (CIPRO) IVPB 400 mg  Status:  Discontinued     400 mg 200 mL/hr over 60 Minutes Intravenous Every 24 hours 12/04/14 0946 12/05/14 1114   12/02/14 2000  ciprofloxacin (CIPRO) IVPB 400 mg  Status:  Discontinued     400 mg 200 mL/hr over 60 Minutes Intravenous Every 12 hours 12/02/14 1034 12/04/14 0946   12/02/14 1500  vancomycin (VANCOCIN) 1,500 mg in sodium chloride 0.9 % 500 mL IVPB     1,500 mg 250 mL/hr over 120 Minutes Intravenous  Once 12/02/14 1447 12/02/14 1556   12/02/14 1200  acyclovir (ZOVIRAX) tablet 400 mg  Status:  Discontinued     400 mg Oral 2 times daily 12/02/14 1034 12/02/14 1830   12/02/14 0645  ciprofloxacin (CIPRO) IVPB 400 mg     400 mg 200 mL/hr over 60 Minutes Intravenous  Once 12/02/14 0641 12/02/14 1001   12/02/14 0645  metroNIDAZOLE (FLAGYL) IVPB 500 mg     500 mg 100 mL/hr over 60 Minutes Intravenous  Once 12/02/14 0641 12/02/14 0901      Subjective:    Ana Long she relates she is having right upper quadrant discomfort. Coarse with breathing.  Objective:    Filed Vitals:   12/05/14 0159 12/05/14 0500 12/05/14 1000 12/05/14 1155  BP:  119/62 131/70   Pulse:  100 94   Temp:  99 F (37.2 C) 98.9 F (37.2 C)   TempSrc:  Oral Oral   Resp:  20 20   Height:  Weight:  132.3 kg (291 lb 10.7 oz)    SpO2: 97% 98% 95% 94%    Intake/Output Summary (Last 24 hours) at 12/05/14 1201 Last data filed at 12/05/14 1000  Gross per 24 hour  Intake 2544.58 ml  Output   3770 ml  Net -1225.42 ml   Filed Weights   12/01/14 2235 12/04/14 1042 12/05/14 0500  Weight: 124.286 kg (274 lb) 133.3 kg (293 lb 14 oz) 132.3 kg (291 lb 10.7 oz)    Exam: Gen:  NAD Cardiovascular:  RRR, No M/R/G Chest and lungs:   CTAB Abdomen:  Abdomen soft, NT/ND, + BS Extremities:  No C/E/C   Data Reviewed:    Labs: Basic Metabolic Panel:  Recent Labs Lab 12/01/14 2344 12/02/14 1105 12/03/14 0353 12/04/14 0525 12/05/14 0520  NA 135  --  138 134* 135  K 3.8  --  4.0 4.3 3.8  CL 101  --  106 102 105  CO2 24  --  24 22 21*  GLUCOSE 209*  --  190* 187* 187*  BUN 6  --  11 17 18   CREATININE 0.56 0.58 1.64* 3.31* 3.43*  CALCIUM 9.1  --  8.0* 7.9* 7.9*    GFR Estimated Creatinine Clearance: 28.6 mL/min (by C-G formula based on Cr of 3.43). Liver Function Tests:  Recent Labs Lab 12/01/14 2344 12/03/14 0353 12/05/14 0520  AST 12* 38 22  ALT 14 33 24  ALKPHOS 49 47 48  BILITOT 1.2 1.1 0.6  PROT 8.3* 6.6 6.5  ALBUMIN 3.8 3.0* 2.9*    Recent Labs Lab 12/01/14 2344  LIPASE 16*   No results for input(s): AMMONIA in the last 168 hours. Coagulation profile No results for input(s): INR, PROTIME in the last 168 hours.  CBC:  Recent Labs Lab 12/01/14 2344 12/02/14 1105 12/03/14 0353 12/04/14 0525 12/05/14 0520  WBC 15.0* 14.6* 12.3* 9.4 7.1  HGB 12.4 11.8* 10.6* 10.0* 9.1*  HCT 37.7 35.9* 32.6* 30.6* 27.6*  MCV 80.2 81.0 80.9 80.5 79.8  PLT 345 286 275 271 274   Cardiac Enzymes: No results for input(s): CKTOTAL, CKMB, CKMBINDEX, TROPONINI in the last 168 hours. BNP (last 3 results) No results for input(s): PROBNP in the last 8760 hours. CBG:  Recent Labs Lab 12/03/14 2306 12/04/14 0815 12/04/14 1221 12/04/14 1708 12/04/14 2236  GLUCAP 167* 180* 191* 160* 145*   D-Dimer: No results for input(s): DDIMER in the last 72 hours. Hgb A1c:  Recent Labs  12/04/14 0525  HGBA1C 7.9*   Lipid Profile: No results for input(s): CHOL, HDL, LDLCALC, TRIG, CHOLHDL, LDLDIRECT in the last 72 hours. Thyroid function studies: No results for input(s): TSH, T4TOTAL, T3FREE, THYROIDAB in the last 72 hours.  Invalid input(s): FREET3 Anemia work up: No results for input(s): VITAMINB12, FOLATE, FERRITIN, TIBC, IRON, RETICCTPCT in the last 72 hours. Sepsis Labs:  Recent Labs Lab 12/02/14 1105 12/03/14 0353 12/04/14 0525 12/05/14 0520  WBC 14.6* 12.3* 9.4 7.1   Microbiology Recent Results (from the past 240 hour(s))  Surgical pcr screen     Status: Abnormal   Collection Time: 12/02/14 11:08 AM  Result Value Ref Range Status   MRSA, PCR POSITIVE (A) NEGATIVE Final    Comment: RESULT CALLED TO, READ BACK BY AND  VERIFIED WITH: P.[email or web address] AT 3244 ON 12/02/14 BY S.VANHOORNE    Staphylococcus aureus POSITIVE (A) NEGATIVE Final    Comment:        The Xpert SA Assay (FDA approved for NASAL specimens in patients  over 68 years of age), is one component of a comprehensive surveillance program.  Test performance has been validated by [facility] for patients greater than or equal to 67 year old. It is not intended to diagnose infection nor to guide or monitor treatment.   Culture, Urine     Status: None (Preliminary result)   Collection Time: 12/04/14 12:28 PM  Result Value Ref Range Status   Specimen Description URINE, RANDOM  Final   Special Requests Normal  Final   Culture   Final    NO GROWTH < 24 HOURS Performed at [facility]    Report Status PENDING  Incomplete     Medications:   . acyclovir  400 mg Oral BID  . albuterol  2.5 mg Nebulization Q6H  . Chlorhexidine Gluconate Cloth  6 each Topical Q0600  . ciprofloxacin  500 mg Oral BID  . enoxaparin (LOVENOX) injection  40 mg Subcutaneous Q24H  . famotidine  20 mg Oral BID  . insulin aspart  0-15 Units Subcutaneous TID WC  . mupirocin ointment  1 application Nasal BID  . topiramate  25 mg Oral BID  . triamcinolone  2 spray Nasal Daily   Continuous Infusions: . sodium chloride 75 mL/hr at 12/04/14 1514    Time spent: 25 min   LOS: 3 days   Charlynne Cousins  [facility] Pager [phone]  *Please refer to [email or web address], password [email or web address] to get updated schedule on who will round on this patient, as hospitalists switch teams weekly. If 7PM-7AM, please contact night-coverage at [email or web address], password [email or web address] for any overnight needs.  12/05/2014, 12:01 PM

## 2014-12-05 NOTE — Progress Notes (Signed)
Echocardiogram 2D Echocardiogram has been performed.  Joelene Millin 12/05/2014, 2:41 PM

## 2014-12-05 NOTE — Progress Notes (Signed)
3 Days Post-Op  Subjective: She is doing better, it sounds like she has some nausea in the AM and is on H2 at home for this.  Tolerating fulls, so I will take her back to carb mod diet.  Drain is clear and not much in in right now.  She says she has some trouble emptying bladder completely.  No BM since Friday, last week.    Objective: Vital signs in last 24 hours: Temp:  [97.9 F (36.6 C)-99 F (37.2 C)] 98.9 F (37.2 C) (10/13 1000) Pulse Rate:  [94-100] 94 (10/13 1000) Resp:  [18-20] 20 (10/13 1000) BP: (119-142)/(62-80) 131/70 mmHg (10/13 1000) SpO2:  [94 %-98 %] 95 % (10/13 1000) Weight:  [132.3 kg (291 lb 10.7 oz)] 132.3 kg (291 lb 10.7 oz) (10/13 0500) Last BM Date: 11/29/14 1080 PO 2 liter IV yesterday Urine 3 liter Drain 15 ml Afebrile, VSS Creatinine is up more today, 3.43,glucose 187 UA is OK, urine culture is pending Urine osmolality 125  (low) Urine Na 21 Urine creatinine 68.82  Weight up some 132, she was 124 kg on admit  Intake/Output from previous day: 10/12 0701 - 10/13 0700 In: 3134.2 [P.O.:1080; I.V.:2054.2] Out: 3065 [Urine:3050; Drains:15] Intake/Output this shift: Total I/O In: 360 [P.O.:360] Out: 950 [Urine:950]  General appearance: alert, cooperative, no distress and she is on her CPAP Resp: clear to auscultation bilaterally GI: tender, she doesn't like you to touch her.  drain is clear, few BS, + flatus no BM.  Sites look fine.  Lab Results:   Recent Labs  12/04/14 0525 12/05/14 0520  WBC 9.4 7.1  HGB 10.0* 9.1*  HCT 30.6* 27.6*  PLT 271 274    BMET  Recent Labs  12/04/14 0525 12/05/14 0520  NA 134* 135  K 4.3 3.8  CL 102 105  CO2 22 21*  GLUCOSE 187* 187*  BUN 17 18  CREATININE 3.31* 3.43*  CALCIUM 7.9* 7.9*   PT/INR No results for input(s): LABPROT, INR in the last 72 hours.   Recent Labs Lab 12/01/14 2344 12/03/14 0353 12/05/14 0520  AST 12* 38 22  ALT 14 33 24  ALKPHOS 49 47 48  BILITOT 1.2 1.1 0.6  PROT  8.3* 6.6 6.5  ALBUMIN 3.8 3.0* 2.9*     Lipase     Component Value Date/Time   LIPASE 16* 12/01/2014 2344     Studies/Results: No results found.  Medications: . albuterol  2.5 mg Nebulization Q6H  . Chlorhexidine Gluconate Cloth  6 each Topical Q0600  . ciprofloxacin  400 mg Intravenous Q24H  . enoxaparin (LOVENOX) injection  40 mg Subcutaneous Q24H  . insulin aspart  0-15 Units Subcutaneous TID WC  . mupirocin ointment  1 application Nasal BID  . topiramate  25 mg Oral BID  . triamcinolone  2 spray Nasal Daily   . sodium chloride 75 mL/hr at 12/04/14 1514   Prior to Admission medications   Medication Sig Start Date End Date Taking? Authorizing Provider  acyclovir (ZOVIRAX) 400 MG tablet TAKE ONE TABLET BY MOUTH TWICE DAILY 11/22/14  Yes Francesca Oman, DO  albuterol (VENTOLIN HFA) 108 (90 BASE) MCG/ACT inhaler Inhale 1-2 puffs into the lungs every 4 (four) hours as needed for wheezing or shortness of breath. 07/25/13  Yes Francesca Oman, DO  atorvastatin (LIPITOR) 10 MG tablet Take 1 tablet (10 mg total) by mouth daily. 07/24/14  Yes Alex Ronnie Derby, DO  glipiZIDE (GLUCOTROL) 5 MG tablet Take 1 tablet (5  mg total) by mouth daily before breakfast. 09/19/14  Yes Francesca Oman, DO  Insulin Glargine (LANTUS SOLOSTAR) 100 UNIT/ML Solostar Pen Inject 20 Units into the skin every morning. 11/20/14  Yes Alex Ronnie Derby, DO  loratadine (CLARITIN) 10 MG tablet Take 1 tablet (10 mg total) by mouth 2 (two) times daily. 07/09/14  Yes Karlene Einstein, MD  phentermine (ADIPEX-P) 37.5 MG tablet Take 37.5 mg by mouth 2 times daily at 12 noon and 4 pm. Takes half tablet twice a day   Yes Historical Provider, MD  Probiotic Product (PROBIOTIC DAILY PO) Take 2 capsules by mouth every evening.    Yes Historical Provider, MD  ranitidine (ZANTAC) 150 MG tablet Take 150 mg by mouth 2 (two) times daily.   Yes Historical Provider, MD  senna (SENOKOT) 8.6 MG TABS tablet Take 2 tablets by mouth at bedtime.   Yes  Historical Provider, MD  topiramate (TOPAMAX) 25 MG tablet Take 25 mg by mouth 2 (two) times daily.   Yes Historical Provider, MD  triamcinolone (NASACORT AQ) 55 MCG/ACT AERO nasal inhaler Place 2 sprays into the nose daily. 07/27/13  Yes Lucious Groves, DO  albuterol (PROAIR HFA) 108 (90 BASE) MCG/ACT inhaler Inhale 2 puffs into the lungs every 4 (four) hours as needed for wheezing or shortness of breath (cough).    Historical Provider, MD  famotidine (PEPCID) 20 MG tablet Take 20 mg by mouth at bedtime.    Historical Provider, MD  insulin glargine (LANTUS) 100 UNIT/ML injection Inject 40 Units into the skin at bedtime.    Historical Provider, MD  TURMERIC PO Take 2 tablets by mouth at bedtime. Takes generic turmeric    Historical Provider, MD    Assessment/Plan Acute cholecystitis, cholelithiasis S/p laparoscopic cholecystectomy, drain placement, 12/02/14 Dr. Harlow Asa Morbid obesity with Body mass index is 50.1 OSA/Hypoventilation syndrome/CPAP AODM Acute renal failure  Antibiotics: Day 3 Cipro, pharmacy adjusting dose secondary to renal function. DVT: Lovenox/SCD  Plan:  Carb modified diet, continue IV fluids i have increased them to 125 ml/hour, continue to mobilize, follow labs/renal function.      LOS: 3 days    Franko Hilliker 12/05/2014

## 2014-12-06 LAB — CBC
HEMATOCRIT: 29.3 % — AB (ref 36.0–46.0)
Hemoglobin: 9.4 g/dL — ABNORMAL LOW (ref 12.0–15.0)
MCH: 25.9 pg — ABNORMAL LOW (ref 26.0–34.0)
MCHC: 32.1 g/dL (ref 30.0–36.0)
MCV: 80.7 fL (ref 78.0–100.0)
PLATELETS: 306 10*3/uL (ref 150–400)
RBC: 3.63 MIL/uL — ABNORMAL LOW (ref 3.87–5.11)
RDW: 14.7 % (ref 11.5–15.5)
WBC: 7.3 10*3/uL (ref 4.0–10.5)

## 2014-12-06 LAB — BASIC METABOLIC PANEL
Anion gap: 6 (ref 5–15)
BUN: 17 mg/dL (ref 6–20)
CALCIUM: 8.5 mg/dL — AB (ref 8.9–10.3)
CO2: 24 mmol/L (ref 22–32)
CREATININE: 2.75 mg/dL — AB (ref 0.44–1.00)
Chloride: 108 mmol/L (ref 101–111)
GFR calc Af Amer: 24 mL/min — ABNORMAL LOW (ref >60)
GFR, EST NON AFRICAN AMERICAN: 20 mL/min — AB (ref >60)
GLUCOSE: 204 mg/dL — AB (ref 65–99)
Potassium: 4.4 mmol/L (ref 3.5–5.1)
Sodium: 138 mmol/L (ref 135–145)

## 2014-12-06 LAB — GLUCOSE, CAPILLARY
Glucose-Capillary: 184 mg/dL — ABNORMAL HIGH (ref 65–99)
Glucose-Capillary: 134 mg/dL — ABNORMAL HIGH (ref 65–99)
Glucose-Capillary: 165 mg/dL — ABNORMAL HIGH (ref 65–99)
Glucose-Capillary: 135 mg/dL — ABNORMAL HIGH (ref 65–99)

## 2014-12-06 MED ORDER — TAMSULOSIN HCL 0.4 MG PO CAPS
0.4000 mg | ORAL_CAPSULE | Freq: Every day | ORAL | Status: DC
  Administered 2014-12-06 – 2014-12-10 (×5): 0.4 mg via ORAL
  Filled 2014-12-06 (×5): qty 1

## 2014-12-06 MED ORDER — SODIUM CHLORIDE 0.9 % IV BOLUS (SEPSIS)
500.0000 mL | Freq: Once | INTRAVENOUS | Status: AC
  Administered 2014-12-06: 500 mL via INTRAVENOUS

## 2014-12-06 MED ORDER — SODIUM CHLORIDE 0.9 % IV SOLN
INTRAVENOUS | Status: AC
  Administered 2014-12-07: 09:00:00 via INTRAVENOUS

## 2014-12-06 NOTE — Progress Notes (Signed)
Inpatient Diabetes Program Recommendations  AACE/ADA: New Consensus Statement on Inpatient Glycemic Control (2015)  Target Ranges:  Prepandial:   less than 140 mg/dL      Peak postprandial:   less than 180 mg/dL (1-2 hours)      Critically ill patients:  140 - 180 mg/dL   Review of Glycemic Control  Diabetes history: DM2 Outpatient Diabetes medications: Lantus 20 units in am and 40 units QHS, glipizide 5 mg QAM Current orders for Inpatient glycemic control: Novolog moderate tidwc  Results for Ana Long, Ana Long (MRN [id number]) as of 12/06/2014 11:21  Ref. Range 12/04/2014 22:36 12/05/2014 12:03 12/05/2014 16:15 12/05/2014 22:38 12/06/2014 08:10  Glucose-Capillary Latest Ref Range: 65-99 mg/dL 145 (H) 160 (H) 202 (H) 151 (H) 184 (H)  Results for Ana Long, Ana Long (MRN [id number]) as of 12/06/2014 11:21  Ref. Range 12/03/2014 03:53 12/04/2014 05:25 12/05/2014 05:20 12/06/2014 05:23  Glucose Latest Ref Range: 65-99 mg/dL 190 (H) 187 (H) 187 (H) 204 (H)  Results for Ana Long, Ana Long (MRN [id number]) as of 12/06/2014 11:21  Ref. Range 12/04/2014 05:25  Hemoglobin A1C Latest Ref Range: 4.8-5.6 % 7.9 (H)   Poor po intake. FBS > 180 mg/dL. Needs portion of basal insulin  Inpatient Diabetes Program Recommendations:     Add Lantus 20 units QHS Add Novolog HS correction  Will continue to follow. Doubtful HgbA1C accurate with low H/H.  Thank you. Ana Long, Ana Long, Ana Long, Ana Long Inpatient Diabetes Coordinator [phone]

## 2014-12-06 NOTE — Discharge Instructions (Signed)
Laparoscopic Cholecystectomy, Care After Refer to this sheet in the next few weeks. These instructions provide you with information about caring for yourself after your procedure. Your health care provider may also give you more specific instructions. Your treatment has been planned according to current medical practices, but problems sometimes occur. Call your health care provider if you have any problems or questions after your procedure. WHAT TO EXPECT AFTER THE PROCEDURE After your procedure, it is common to have:  Pain at your incision sites. You will be given pain medicines to control your pain.  Mild nausea or vomiting. This should improve after the first 24 hours.  Bloating and possible shoulder pain from the gas that was used during the procedure. This will improve after the first 24 hours. HOME CARE INSTRUCTIONS Incision Care  Follow instructions from your health care provider about how to take care of your incisions. Make sure you:  Wash your hands with soap and water before you change your bandage (dressing). If soap and water are not available, use hand sanitizer.  Change your dressing as told by your health care provider.  Leave stitches (sutures), skin glue, or adhesive strips in place. These skin closures may need to be in place for 2 weeks or longer. If adhesive strip edges start to loosen and curl up, you may trim the loose edges. Do not remove adhesive strips completely unless your health care provider tells you to do that.  Do not take baths, swim, or use a hot tub until your health care provider approves. Ask your health care provider if you can take showers. You may only be allowed to take sponge baths for bathing. General Instructions  Take over-the-counter and prescription medicines only as told by your health care provider.  Do not drive or operate heavy machinery while taking prescription pain medicine.  Return to your normal diet as told by your health care  provider.  Do not lift anything that is heavier than 10 lb (4.5 kg).  Do not play contact sports for one week or until your health care provider approves. SEEK MEDICAL CARE IF:   You have redness, swelling, or pain at the site of your incision.  You have fluid, blood, or pus coming from your incision.  You notice a bad smell coming from your incision area.  Your surgical incisions break open.  You have a fever. SEEK IMMEDIATE MEDICAL CARE IF:  You develop a rash.  You have difficulty breathing.  You have chest pain.  You have increasing pain in your shoulders (shoulder strap areas).  You faint or have dizzy episodes while you are standing.  You have severe pain in your abdomen.  You have nausea or vomiting that lasts for more than one day.   This information is not intended to replace advice given to you by your health care provider. Make sure you discuss any questions you have with your health care provider.   Document Released: 02/08/2005 Document Revised: 10/30/2014 Document Reviewed: 09/20/2012 Elsevier Interactive Patient Education 2016 [place] ______[employer] LAPAROSCOPIC SURGERY: POST OP INSTRUCTIONS Always review your discharge instruction sheet given to you by the facility where your surgery was performed. IF YOU HAVE DISABILITY OR FAMILY LEAVE FORMS, YOU MUST BRING THEM TO THE OFFICE FOR PROCESSING.   DO NOT GIVE THEM TO YOUR DOCTOR.  1. A prescription for pain medication may be given to you upon discharge.  Take your pain medication as prescribed, if needed.  If narcotic  pain medicine is not needed, then you may take acetaminophen (Tylenol) or ibuprofen (Advil) as needed. 2. Take your usually prescribed medications unless otherwise directed. 3. If you need a refill on your pain medication, please contact your pharmacy.  They will contact our office to request authorization. Prescriptions will not be filled after 5pm or on  week-ends. 4. You should follow a light diet the first few days after arrival home, such as soup and crackers, etc.  Be sure to include lots of fluids daily. 5. Most patients will experience some swelling and bruising in the area of the incisions.  Ice packs will help.  Swelling and bruising can take several days to resolve.  6. It is common to experience some constipation if taking pain medication after surgery.  Increasing fluid intake and taking a stool softener (such as Colace) will usually help or prevent this problem from occurring.  A mild laxative (Milk of Magnesia or Miralax) should be taken according to package instructions if there are no bowel movements after 48 hours. 7. Unless discharge instructions indicate otherwise, you may remove your bandages 24-48 hours after surgery, and you may shower at that time.  You may have steri-strips (small skin tapes) in place directly over the incision.  These strips should be left on the skin for 7-10 days.  If your surgeon used skin glue on the incision, you may shower in 24 hours.  The glue will flake off over the next 2-3 weeks.  Any sutures or staples will be removed at the office during your follow-up visit. 8. ACTIVITIES:  You may resume regular (light) daily activities beginning the next day--such as daily self-care, walking, climbing stairs--gradually increasing activities as tolerated.  You may have sexual intercourse when it is comfortable.  Refrain from any heavy lifting or straining until approved by your doctor. a. You may drive when you are no longer taking prescription pain medication, you can comfortably wear a seatbelt, and you can safely maneuver your car and apply brakes. b. RETURN TO WORK:  __________________________________________________________ 9. You should see your doctor in the office for a follow-up appointment approximately 2-3 weeks after your surgery.  Make sure that you call for this appointment within a day or two after you  arrive home to insure a convenient appointment time. 10. OTHER INSTRUCTIONS: __________________________________________________________________________________________________________________________ __________________________________________________________________________________________________________________________ WHEN TO CALL YOUR DOCTOR: 1. Fever over 101.0 2. Inability to urinate 3. Continued bleeding from incision. 4. Increased pain, redness, or drainage from the incision. 5. Increasing abdominal pain  The clinic staff is available to answer your questions during regular business hours.  Please dont hesitate to call and ask to speak to one of the nurses for clinical concerns.  If you have a medical emergency, go to the nearest emergency room or call 911.  A surgeon from [facility] is always on call at the hospital. [address], [place], [place], [place]  [address] ? [address], [place], [place]   [address] [phone] ? [phone] ? FAX [phone] Web site: [email or web address]

## 2014-12-06 NOTE — Progress Notes (Signed)
Distilled H20 added to home CPAP unit.  Patient is able to placed herself on/off as needed. Encouraged to call for assistance if needed.

## 2014-12-06 NOTE — Progress Notes (Signed)
Physical Therapy Treatment Patient Details Name: Ana Long MRN: [id number] DOB: [birth year] Today's Date: 12/06/2014    History of Present Illness      PT Comments    Assisted out of recliner to amb to bathroom then in hallway.  Follow Up Recommendations  No PT follow up     Equipment Recommendations       Recommendations for Other Services       Precautions / Restrictions      Mobility  Bed Mobility               General bed mobility comments: Pt OOB in recliner  Transfers Overall transfer level: Needs assistance Equipment used: None Transfers: Sit to/from Stand Sit to Stand: Supervision         General transfer comment: increased time  Ambulation/Gait Ambulation/Gait assistance: Supervision;Min guard Ambulation Distance (Feet): 385 Feet Assistive device: Rolling walker (2 wheeled) Gait Pattern/deviations: Step-through pattern     General Gait Details: mild use of walker to steady self due to recent ABD surgery   Stairs            Wheelchair Mobility    Modified Rankin (Stroke Patients Only)       Balance                                    Cognition Arousal/Alertness: Awake/alert Behavior During Therapy: WFL for tasks assessed/performed Overall Cognitive Status: Within Functional Limits for tasks assessed                      Exercises      General Comments        Pertinent Vitals/Pain Pain Assessment: Faces Faces Pain Scale: Hurts little more Pain Location: R ABD near drain site Pain Descriptors / Indicators: Sore;Tender Pain Intervention(s): Monitored during session;Repositioned    Home Living                      Prior Function            PT Goals (current goals can now be found in the care plan section) Progress towards PT goals: Progressing toward goals    Frequency  Min 3X/week    PT Plan      Co-evaluation             End of Session Equipment  Utilized During Treatment: Gait belt Activity Tolerance: Patient tolerated treatment well Patient left: in chair;with call bell/phone within reach     Time: 1400-1425 PT Time Calculation (min) (ACUTE ONLY): 25 min  Charges:  $Gait Training: 8-22 mins $Therapeutic Activity: 8-22 mins                    G Codes:      Rica Koyanagi  PTA [facility]  Acute  Rehab Pager      [phone]

## 2014-12-06 NOTE — Progress Notes (Signed)
4 Days Post-Op  Subjective: She is doing fine from surgery, her biggest complaint is trouble voiding and emptying bladder.  Dr. Olevia Bowens suggested urology consult so I will give them a call.  She seems to be tolerating PO's well  Objective: Vital signs in last 24 hours: Temp:  [98 F (36.7 C)-98.1 F (36.7 C)] 98 F (36.7 C) (10/14 0514) Pulse Rate:  [80-98] 80 (10/14 0906) Resp:  [18] 18 (10/14 0906) BP: (120-137)/(66-87) 120/66 mmHg (10/14 0514) SpO2:  [94 %-100 %] 99 % (10/14 0906) Weight:  [124.376 kg (274 lb 3.2 oz)] 124.376 kg (274 lb 3.2 oz) (10/14 0600) Last BM Date: 12/06/14 6 liters urine yesterday + BM Low carb diet Creatinine is down to 2.75, glucose 204. CBC is OK Intake/Output from previous day: 10/13 0701 - 10/14 0700 In: 2759.6 [P.O.:720; I.V.:2039.6] Out: 6210 [Urine:6200; Drains:10] Intake/Output this shift: Total I/O In: 460 [P.O.:460] Out: 1300 [Urine:1300]  General appearance: alert, cooperative and no distress GI: soft sore, but much better than before.    Lab Results:   Recent Labs  12/05/14 0520 12/06/14 0523  WBC 7.1 7.3  HGB 9.1* 9.4*  HCT 27.6* 29.3*  PLT 274 306    BMET  Recent Labs  12/05/14 0520 12/06/14 0523  NA 135 138  K 3.8 4.4  CL 105 108  CO2 21* 24  GLUCOSE 187* 204*  BUN 18 17  CREATININE 3.43* 2.75*  CALCIUM 7.9* 8.5*   PT/INR No results for input(s): LABPROT, INR in the last 72 hours.   Recent Labs Lab 12/01/14 2344 12/03/14 0353 12/05/14 0520  AST 12* 38 22  ALT 14 33 24  ALKPHOS 49 47 48  BILITOT 1.2 1.1 0.6  PROT 8.3* 6.6 6.5  ALBUMIN 3.8 3.0* 2.9*     Lipase     Component Value Date/Time   LIPASE 16* 12/01/2014 2344     Studies/Results: No results found.  Medications: . acyclovir  400 mg Oral BID  . albuterol  2.5 mg Nebulization Q6H  . Chlorhexidine Gluconate Cloth  6 each Topical Q0600  . ciprofloxacin  500 mg Oral BID  . enoxaparin (LOVENOX) injection  40 mg Subcutaneous Q24H  .  famotidine  20 mg Oral BID  . insulin aspart  0-15 Units Subcutaneous TID WC  . mupirocin ointment  1 application Nasal BID  . topiramate  25 mg Oral BID  . triamcinolone  2 spray Nasal Daily    Assessment/Plan Acute cholecystitis, cholelithiasis S/p laparoscopic cholecystectomy, drain placement, 12/02/14 Dr. Harlow Asa Morbid obesity with Body mass index is 50.1 OSA/Hypoventilation syndrome/CPAP AODM Acute renal failure  Antibiotics: Day 3 Cipro, pharmacy adjusting dose secondary to renal function. DVT: Lovenox/SCD   Plan:  Continue hydration, I have ask Urology to see.  Recheck BMP tomorrow.  No growth from urine so I will stop Cipro.  D/c drain today.   Discussed with dr. Risa Grill, he recommends we put her on Flomax, and see if that helps.  If she has trouble we need to get a post void residual and then refer her back to Urology.  I have ordered the flomax.    LOS: 4 days    Ana Long 12/06/2014

## 2014-12-06 NOTE — Progress Notes (Addendum)
[facility] PROGRESS NOTE    Progress Note   Ana Long QIW:[id number] DOB: 15-Aug-1975 DOA: 12/01/2014 PCP: Duwaine Maxin, DO   Brief Narrative:   Ana Long is an 39 y.o. female status post laparoscopic cholecystectomy were consulted for acute kidney injury.  Assessment/Plan:   Acute cholecystitis - Further management per surgery. Having difficulty urinating will deferred to surgery.  OSA (obstructive sleep apnea) - Continue BiPAP at night.  AKI (acute kidney injury) ([place]) FENA was less than 1%. Compatible with prerenal azotemia. She was continuing IV fluids and now her creatinine is improving, we'll continue IV fluids for an additional 24 hours check a basic metabolic panel in the morning. I will sign off.    DVT Prophylaxis - Lovenox ordered.  Family Communication: none Disposition Plan: Home when stable. Code Status:     Code Status Orders        Start     Ordered   12/02/14 1831  Full code   Continuous     12/02/14 1830    Advance Directive Documentation        Most Recent Value   Type of Advance Directive  Healthcare Power of Attorney   Pre-existing out of facility DNR order (yellow form or pink MOST form)     "MOST" Form in Place?          IV Access:    Peripheral IV   Procedures and diagnostic studies:   No results found.   Medical Consultants:    None.  Anti-Infectives:   Anti-infectives    Start     Dose/Rate Route Frequency Ordered Stop   12/05/14 1200  ciprofloxacin (CIPRO) tablet 500 mg     500 mg Oral 2 times daily 12/05/14 1114     12/05/14 1200  acyclovir (ZOVIRAX) 200 MG capsule 400 mg     400 mg Oral 2 times daily 12/05/14 1116     12/05/14 0800  ciprofloxacin (CIPRO) IVPB 400 mg  Status:  Discontinued     400 mg 200 mL/hr over 60 Minutes Intravenous Every 24 hours 12/04/14 0946 12/05/14 1114   12/02/14 2000  ciprofloxacin (CIPRO) IVPB 400 mg  Status:  Discontinued     400 mg 200 mL/hr  over 60 Minutes Intravenous Every 12 hours 12/02/14 1034 12/04/14 0946   12/02/14 1500  vancomycin (VANCOCIN) 1,500 mg in sodium chloride 0.9 % 500 mL IVPB     1,500 mg 250 mL/hr over 120 Minutes Intravenous  Once 12/02/14 1447 12/02/14 1556   12/02/14 1200  acyclovir (ZOVIRAX) tablet 400 mg  Status:  Discontinued     400 mg Oral 2 times daily 12/02/14 1034 12/02/14 1830   12/02/14 0645  ciprofloxacin (CIPRO) IVPB 400 mg     400 mg 200 mL/hr over 60 Minutes Intravenous  Once 12/02/14 0641 12/02/14 1001   12/02/14 0645  metroNIDAZOLE (FLAGYL) IVPB 500 mg     500 mg 100 mL/hr over 60 Minutes Intravenous  Once 12/02/14 0641 12/02/14 0901      Subjective:    Ana Long she's having difficulty initiating micturition.  Objective:    Filed Vitals:   12/05/14 2224 12/06/14 0514 12/06/14 0600 12/06/14 0906  BP: 137/82 120/66    Pulse: 88 93  80  Temp: 98.1 F (36.7 C) 98 F (36.7 C)    TempSrc: Oral Oral    Resp:    18  Height:      Weight:   124.376 kg (274 lb  3.2 oz)   SpO2: 99% 99%  99%    Intake/Output Summary (Last 24 hours) at 12/06/14 1000 Last data filed at 12/06/14 0846  Gross per 24 hour  Intake 2399.58 ml  Output   5705 ml  Net -3305.42 ml   Filed Weights   12/04/14 1042 12/05/14 0500 12/06/14 0600  Weight: 133.3 kg (293 lb 14 oz) 132.3 kg (291 lb 10.7 oz) 124.376 kg (274 lb 3.2 oz)    Exam: Gen:  NAD Cardiovascular:  RRR, No M/R/G Chest and lungs:   CTAB Abdomen:  Abdomen soft, NT/ND, + BS Extremities:  No C/E/C   Data Reviewed:    Labs: Basic Metabolic Panel:  Recent Labs Lab 12/01/14 2344 12/02/14 1105 12/03/14 0353 12/04/14 0525 12/05/14 0520 12/06/14 0523  NA 135  --  138 134* 135 138  K 3.8  --  4.0 4.3 3.8 4.4  CL 101  --  106 102 105 108  CO2 24  --  24 22 21* 24  GLUCOSE 209*  --  190* 187* 187* 204*  BUN 6  --  11 17 18 17   CREATININE 0.56 0.58 1.64* 3.31* 3.43* 2.75*  CALCIUM 9.1  --  8.0* 7.9* 7.9* 8.5*    GFR Estimated Creatinine Clearance: 34.3 mL/min (by C-G formula based on Cr of 2.75). Liver Function Tests:  Recent Labs Lab 12/01/14 2344 12/03/14 0353 12/05/14 0520  AST 12* 38 22  ALT 14 33 24  ALKPHOS 49 47 48  BILITOT 1.2 1.1 0.6  PROT 8.3* 6.6 6.5  ALBUMIN 3.8 3.0* 2.9*    Recent Labs Lab 12/01/14 2344  LIPASE 16*   No results for input(s): AMMONIA in the last 168 hours. Coagulation profile No results for input(s): INR, PROTIME in the last 168 hours.  CBC:  Recent Labs Lab 12/02/14 1105 12/03/14 0353 12/04/14 0525 12/05/14 0520 12/06/14 0523  WBC 14.6* 12.3* 9.4 7.1 7.3  HGB 11.8* 10.6* 10.0* 9.1* 9.4*  HCT 35.9* 32.6* 30.6* 27.6* 29.3*  MCV 81.0 80.9 80.5 79.8 80.7  PLT 286 275 271 274 306   Cardiac Enzymes: No results for input(s): CKTOTAL, CKMB, CKMBINDEX, TROPONINI in the last 168 hours. BNP (last 3 results) No results for input(s): PROBNP in the last 8760 hours. CBG:  Recent Labs Lab 12/04/14 2236 12/05/14 1203 12/05/14 1615 12/05/14 2238 12/06/14 0810  GLUCAP 145* 160* 202* 151* 184*   D-Dimer: No results for input(s): DDIMER in the last 72 hours. Hgb A1c:  Recent Labs  12/04/14 0525  HGBA1C 7.9*   Lipid Profile: No results for input(s): CHOL, HDL, LDLCALC, TRIG, CHOLHDL, LDLDIRECT in the last 72 hours. Thyroid function studies: No results for input(s): TSH, T4TOTAL, T3FREE, THYROIDAB in the last 72 hours.  Invalid input(s): FREET3 Anemia work up: No results for input(s): VITAMINB12, FOLATE, FERRITIN, TIBC, IRON, RETICCTPCT in the last 72 hours. Sepsis Labs:  Recent Labs Lab 12/03/14 0353 12/04/14 0525 12/05/14 0520 12/06/14 0523  WBC 12.3* 9.4 7.1 7.3   Microbiology Recent Results (from the past 240 hour(s))  Surgical pcr screen     Status: Abnormal   Collection Time: 12/02/14 11:08 AM  Result Value Ref Range Status   MRSA, PCR POSITIVE (A) NEGATIVE Final    Comment: RESULT CALLED TO, READ BACK BY AND VERIFIED  WITH: P.[email or web address] AT 1405 ON 12/02/14 BY S.[email or web address]    Staphylococcus aureus POSITIVE (A) NEGATIVE Final    Comment:        The Xpert SA Assay (FDA approved  for NASAL specimens in patients over 49 years of age), is one component of a comprehensive surveillance program.  Test performance has been validated by [facility] for patients greater than or equal to 54 year old. It is not intended to diagnose infection nor to guide or monitor treatment.   Culture, Urine     Status: None   Collection Time: 12/04/14 12:28 PM  Result Value Ref Range Status   Specimen Description URINE, RANDOM  Final   Special Requests Normal  Final   Culture   Final    NO GROWTH 1 DAY Performed at [facility]    Report Status 12/05/2014 FINAL  Final     Medications:   . acyclovir  400 mg Oral BID  . albuterol  2.5 mg Nebulization Q6H  . Chlorhexidine Gluconate Cloth  6 each Topical Q0600  . ciprofloxacin  500 mg Oral BID  . enoxaparin (LOVENOX) injection  40 mg Subcutaneous Q24H  . famotidine  20 mg Oral BID  . insulin aspart  0-15 Units Subcutaneous TID WC  . mupirocin ointment  1 application Nasal BID  . topiramate  25 mg Oral BID  . triamcinolone  2 spray Nasal Daily   Continuous Infusions: . sodium chloride 125 mL/hr at 12/05/14 2125    Time spent: 25 min   LOS: 4 days   Charlynne Cousins  [facility] Pager [phone]  *Please refer to [email or web address], password [email or web address] to get updated schedule on who will round on this patient, as hospitalists switch teams weekly. If 7PM-7AM, please contact night-coverage at [email or web address], password [email or web address] for any overnight needs.  12/06/2014, 10:00 AM

## 2014-12-07 LAB — GLUCOSE, CAPILLARY
GLUCOSE-CAPILLARY: 162 mg/dL — AB (ref 65–99)
GLUCOSE-CAPILLARY: 127 mg/dL — AB (ref 65–99)
Glucose-Capillary: 207 mg/dL — ABNORMAL HIGH (ref 65–99)
Glucose-Capillary: 142 mg/dL — ABNORMAL HIGH (ref 65–99)

## 2014-12-07 LAB — BASIC METABOLIC PANEL
Anion gap: 10 (ref 5–15)
BUN: 18 mg/dL (ref 6–20)
CALCIUM: 8.5 mg/dL — AB (ref 8.9–10.3)
CO2: 22 mmol/L (ref 22–32)
CREATININE: 2.13 mg/dL — AB (ref 0.44–1.00)
Chloride: 107 mmol/L (ref 101–111)
GFR, EST AFRICAN AMERICAN: 32 mL/min — AB (ref >60)
GFR, EST NON AFRICAN AMERICAN: 28 mL/min — AB (ref >60)
Glucose, Bld: 205 mg/dL — ABNORMAL HIGH (ref 65–99)
Potassium: 4.6 mmol/L (ref 3.5–5.1)
SODIUM: 139 mmol/L (ref 135–145)

## 2014-12-07 MED ORDER — ALBUTEROL SULFATE (2.5 MG/3ML) 0.083% IN NEBU
2.5000 mg | INHALATION_SOLUTION | Freq: Three times a day (TID) | RESPIRATORY_TRACT | Status: DC
  Administered 2014-12-07 – 2014-12-10 (×10): 2.5 mg via RESPIRATORY_TRACT
  Filled 2014-12-07 (×11): qty 3

## 2014-12-07 NOTE — Progress Notes (Signed)
Patient ID: Ana Long, female   DOB: [birth year], 39 y.o.   MRN: [id number]  [facility]  POD#: 5  Subjective: Patient in bed, mild pain.  Voiding better.  Objective: Vital signs in last 24 hours: Temp:  [97.9 F (36.6 C)-98.6 F (37 C)] 98.5 F (36.9 C) (10/15 0500) Pulse Rate:  [91-100] 100 (10/15 0500) Resp:  [18-20] 18 (10/15 0500) BP: (140-156)/(77-89) 140/77 mmHg (10/15 0500) SpO2:  [96 %-100 %] 100 % (10/15 0855) Last BM Date: 12/06/14  Intake/Output from previous day: 10/14 0701 - 10/15 0700 In: 2120 [P.O.:1420; I.V.:700] Out: 5900 [Urine:5750; Drains:150] Intake/Output this shift:    Physical Exam: HEENT - sclerae clear, mucous membranes moist Neck - soft Chest - clear bilaterally Cor - RRR Abdomen - soft, obese; dressings with serous drainage RUQ - changed  Lab Results:   Recent Labs  12/05/14 0520 12/06/14 0523  WBC 7.1 7.3  HGB 9.1* 9.4*  HCT 27.6* 29.3*  PLT 274 306   BMET  Recent Labs  12/06/14 0523 12/07/14 0510  NA 138 139  K 4.4 4.6  CL 108 107  CO2 24 22  GLUCOSE 204* 205*  BUN 17 18  CREATININE 2.75* 2.13*  CALCIUM 8.5* 8.5*   PT/INR No results for input(s): LABPROT, INR in the last 72 hours. Comprehensive Metabolic Panel:    Component Value Date/Time   NA 139 12/07/2014 0510   NA 138 12/06/2014 0523   K 4.6 12/07/2014 0510   K 4.4 12/06/2014 0523   CL 107 12/07/2014 0510   CL 108 12/06/2014 0523   CO2 22 12/07/2014 0510   CO2 24 12/06/2014 0523   BUN 18 12/07/2014 0510   BUN 17 12/06/2014 0523   CREATININE 2.13* 12/07/2014 0510   CREATININE 2.75* 12/06/2014 0523   CREATININE 0.60 07/24/2014 1550   CREATININE 0.55 07/25/2013 1531   GLUCOSE 205* 12/07/2014 0510   GLUCOSE 204* 12/06/2014 0523   CALCIUM 8.5* 12/07/2014 0510   CALCIUM 8.5* 12/06/2014 0523   AST 22 12/05/2014 0520   AST 38 12/03/2014 0353   ALT 24 12/05/2014 0520   ALT 33 12/03/2014 0353   ALKPHOS 48  12/05/2014 0520   ALKPHOS 47 12/03/2014 0353   BILITOT 0.6 12/05/2014 0520   BILITOT 1.1 12/03/2014 0353   PROT 6.5 12/05/2014 0520   PROT 6.6 12/03/2014 0353   ALBUMIN 2.9* 12/05/2014 0520   ALBUMIN 3.0* 12/03/2014 0353    Studies/Results: No results found.  Anti-infectives: Anti-infectives    Start     Dose/Rate Route Frequency Ordered Stop   12/05/14 1200  ciprofloxacin (CIPRO) tablet 500 mg  Status:  Discontinued     500 mg Oral 2 times daily 12/05/14 1114 12/06/14 1201   12/05/14 1200  acyclovir (ZOVIRAX) 200 MG capsule 400 mg     400 mg Oral 2 times daily 12/05/14 1116     12/05/14 0800  ciprofloxacin (CIPRO) IVPB 400 mg  Status:  Discontinued     400 mg 200 mL/hr over 60 Minutes Intravenous Every 24 hours 12/04/14 0946 12/05/14 1114   12/02/14 2000  ciprofloxacin (CIPRO) IVPB 400 mg  Status:  Discontinued     400 mg 200 mL/hr over 60 Minutes Intravenous Every 12 hours 12/02/14 1034 12/04/14 0946   12/02/14 1500  vancomycin (VANCOCIN) 1,500 mg in sodium chloride 0.9 % 500 mL IVPB     1,500 mg 250 mL/hr over 120 Minutes Intravenous  Once 12/02/14 1447 12/02/14 1556  12/02/14 1200  acyclovir (ZOVIRAX) tablet 400 mg  Status:  Discontinued     400 mg Oral 2 times daily 12/02/14 1034 12/02/14 1830   12/02/14 0645  ciprofloxacin (CIPRO) IVPB 400 mg     400 mg 200 mL/hr over 60 Minutes Intravenous  Once 12/02/14 0641 12/02/14 1001   12/02/14 0645  metroNIDAZOLE (FLAGYL) IVPB 500 mg     500 mg 100 mL/hr over 60 Minutes Intravenous  Once 12/02/14 0641 12/02/14 0901      Assessment & Plans: Status post lap chole for acute cholecystitis  Regular diet  Ambulate  Monitor drainage from RUQ wounds - JP drain out Acute renal insufficiency  Improving - creatinine down to 2.1 today Urinary symptoms  Improved on Flomax per urology  Hopefully home next 1-2 days.  [name], MD, [facility] Office: [place] 12/07/2014

## 2014-12-07 NOTE — Progress Notes (Signed)
Patient placed herself on home CPAP unit for the night

## 2014-12-08 LAB — GLUCOSE, CAPILLARY
GLUCOSE-CAPILLARY: 169 mg/dL — AB (ref 65–99)
Glucose-Capillary: 120 mg/dL — ABNORMAL HIGH (ref 65–99)
Glucose-Capillary: 135 mg/dL — ABNORMAL HIGH (ref 65–99)

## 2014-12-08 NOTE — Progress Notes (Signed)
Patient ID: Ana Long, female   DOB: [birth year], 39 y.o.   MRN: [id number]  [facility]  POD#: 6  Subjective: Patient up at bedside.  Ambulating with walker.  Multiple complaints about urinary retention / frequency.  Tolerating regular diet.  Objective: Vital signs in last 24 hours: Temp:  [98 F (36.7 C)-98.2 F (36.8 C)] 98.1 F (36.7 C) (10/16 0600) Pulse Rate:  [80-96] 92 (10/16 0600) Resp:  [18] 18 (10/16 0600) BP: (138-153)/(74-85) 138/74 mmHg (10/16 0600) SpO2:  [96 %-100 %] 98 % (10/16 0846) Weight:  [133.176 kg (293 lb 9.6 oz)] 133.176 kg (293 lb 9.6 oz) (10/16 0100) Last BM Date: 12/06/14  Intake/Output from previous day: 10/15 0701 - 10/16 0700 In: 1640 [P.O.:840; I.V.:800] Out: 3850 [Urine:3850] Intake/Output this shift:    Physical Exam: HEENT - sclerae clear, mucous membranes moist Neck - soft Abdomen - soft, obese; wounds RUQ dry, no further drainage - dressing removed Neuro - alert & oriented, no focal deficits  Lab Results:   Recent Labs  12/06/14 0523  WBC 7.3  HGB 9.4*  HCT 29.3*  PLT 306   BMET  Recent Labs  12/06/14 0523 12/07/14 0510  NA 138 139  K 4.4 4.6  CL 108 107  CO2 24 22  GLUCOSE 204* 205*  BUN 17 18  CREATININE 2.75* 2.13*  CALCIUM 8.5* 8.5*   PT/INR No results for input(s): LABPROT, INR in the last 72 hours. Comprehensive Metabolic Panel:    Component Value Date/Time   NA 139 12/07/2014 0510   NA 138 12/06/2014 0523   K 4.6 12/07/2014 0510   K 4.4 12/06/2014 0523   CL 107 12/07/2014 0510   CL 108 12/06/2014 0523   CO2 22 12/07/2014 0510   CO2 24 12/06/2014 0523   BUN 18 12/07/2014 0510   BUN 17 12/06/2014 0523   CREATININE 2.13* 12/07/2014 0510   CREATININE 2.75* 12/06/2014 0523   CREATININE 0.60 07/24/2014 1550   CREATININE 0.55 07/25/2013 1531   GLUCOSE 205* 12/07/2014 0510   GLUCOSE 204* 12/06/2014 0523   CALCIUM 8.5* 12/07/2014 0510   CALCIUM 8.5*  12/06/2014 0523   AST 22 12/05/2014 0520   AST 38 12/03/2014 0353   ALT 24 12/05/2014 0520   ALT 33 12/03/2014 0353   ALKPHOS 48 12/05/2014 0520   ALKPHOS 47 12/03/2014 0353   BILITOT 0.6 12/05/2014 0520   BILITOT 1.1 12/03/2014 0353   PROT 6.5 12/05/2014 0520   PROT 6.6 12/03/2014 0353   ALBUMIN 2.9* 12/05/2014 0520   ALBUMIN 3.0* 12/03/2014 0353    Studies/Results: No results found.  Anti-infectives: Anti-infectives    Start     Dose/Rate Route Frequency Ordered Stop   12/05/14 1200  ciprofloxacin (CIPRO) tablet 500 mg  Status:  Discontinued     500 mg Oral 2 times daily 12/05/14 1114 12/06/14 1201   12/05/14 1200  acyclovir (ZOVIRAX) 200 MG capsule 400 mg     400 mg Oral 2 times daily 12/05/14 1116     12/05/14 0800  ciprofloxacin (CIPRO) IVPB 400 mg  Status:  Discontinued     400 mg 200 mL/hr over 60 Minutes Intravenous Every 24 hours 12/04/14 0946 12/05/14 1114   12/02/14 2000  ciprofloxacin (CIPRO) IVPB 400 mg  Status:  Discontinued     400 mg 200 mL/hr over 60 Minutes Intravenous Every 12 hours 12/02/14 1034 12/04/14 0946   12/02/14 1500  vancomycin (VANCOCIN) 1,500 mg in sodium chloride  0.9 % 500 mL IVPB     1,500 mg 250 mL/hr over 120 Minutes Intravenous  Once 12/02/14 1447 12/02/14 1556   12/02/14 1200  acyclovir (ZOVIRAX) tablet 400 mg  Status:  Discontinued     400 mg Oral 2 times daily 12/02/14 1034 12/02/14 1830   12/02/14 0645  ciprofloxacin (CIPRO) IVPB 400 mg     400 mg 200 mL/hr over 60 Minutes Intravenous  Once 12/02/14 0641 12/02/14 1001   12/02/14 0645  metroNIDAZOLE (FLAGYL) IVPB 500 mg     500 mg 100 mL/hr over 60 Minutes Intravenous  Once 12/02/14 2481 12/02/14 0901      Assessment & Plans: Status post lap chole for acute cholecystitis Regular diet Ambulate Acute renal insufficiency Improving - check labs in AM 10/17 Urinary symptoms Improved on Flomax per urology  May need urology to actually  see the patient in AM 10/17  Hopefully home next 1-2 days.  Ana Regal, MD, [facility] Office: [place] 12/08/2014

## 2014-12-08 NOTE — Progress Notes (Signed)
Pt stated that she would self administer home CPAP when ready for bed.  Pt to notify RT if any assistance is required, RT to monitor and assess as needed.

## 2014-12-09 DIAGNOSIS — R339 Retention of urine, unspecified: Secondary | ICD-10-CM | POA: Diagnosis not present

## 2014-12-09 LAB — BASIC METABOLIC PANEL
Anion gap: 9 (ref 5–15)
BUN: 21 mg/dL — AB (ref 6–20)
CALCIUM: 8.9 mg/dL (ref 8.9–10.3)
CO2: 25 mmol/L (ref 22–32)
CREATININE: 1.45 mg/dL — AB (ref 0.44–1.00)
Chloride: 105 mmol/L (ref 101–111)
GFR calc Af Amer: 51 mL/min — ABNORMAL LOW (ref >60)
GFR, EST NON AFRICAN AMERICAN: 44 mL/min — AB (ref >60)
GLUCOSE: 173 mg/dL — AB (ref 65–99)
Potassium: 4.4 mmol/L (ref 3.5–5.1)
Sodium: 139 mmol/L (ref 135–145)

## 2014-12-09 LAB — GLUCOSE, CAPILLARY
Glucose-Capillary: 187 mg/dL — ABNORMAL HIGH (ref 65–99)
Glucose-Capillary: 158 mg/dL — ABNORMAL HIGH (ref 65–99)
Glucose-Capillary: 145 mg/dL — ABNORMAL HIGH (ref 65–99)
Glucose-Capillary: 129 mg/dL — ABNORMAL HIGH (ref 65–99)
Glucose-Capillary: 152 mg/dL — ABNORMAL HIGH (ref 65–99)

## 2014-12-09 MED ORDER — TAMSULOSIN HCL 0.4 MG PO CAPS: 0.4000 mg | ORAL_CAPSULE | Freq: Every day | ORAL | Status: DC

## 2014-12-09 MED ORDER — OXYCODONE HCL 5 MG PO TABS: 5.0000 mg | ORAL_TABLET | ORAL | Status: DC | PRN

## 2014-12-09 MED ORDER — ACETAMINOPHEN 325 MG PO TABS: 325.0000 mg | ORAL_TABLET | Freq: Four times a day (QID) | ORAL | Status: DC | PRN

## 2014-12-09 NOTE — Progress Notes (Signed)
7 Days Post-Op  Subjective: I think she is doing fine, still complaining of trouble voiding she has been on Flomax for 3 days now.  Also note early menstruation.    Objective: Vital signs in last 24 hours: Temp:  [98 F (36.7 C)-98.8 F (37.1 C)] 98.7 F (37.1 C) (10/17 0615) Pulse Rate:  [85-102] 86 (10/17 0615) Resp:  [17-18] 17 (10/17 0615) BP: (125-140)/(60-83) 135/60 mmHg (10/17 0615) SpO2:  [95 %-100 %] 95 % (10/17 0834) Weight:  [130.953 kg (288 lb 11.2 oz)] 130.953 kg (288 lb 11.2 oz) (10/17 0615) Last BM Date: 12/08/14 Good urine output Carb Mod diet +BM Afebrile, VSS Creatinine is almost back to normal  Intake/Output from previous day: 10/16 0701 - 10/17 0700 In: -  Out: 1950 [Urine:1950] Intake/Output this shift:    General appearance: alert, cooperative and no distress GI: soft sore, sites all look fine.  Lab Results:  No results for input(s): WBC, HGB, HCT, PLT in the last 72 hours.  BMET  Recent Labs  12/07/14 0510 12/09/14 0544  NA 139 139  K 4.6 4.4  CL 107 105  CO2 22 25  GLUCOSE 205* 173*  BUN 18 21*  CREATININE 2.13* 1.45*  CALCIUM 8.5* 8.9   PT/INR No results for input(s): LABPROT, INR in the last 72 hours.   Recent Labs Lab 12/03/14 0353 12/05/14 0520  AST 38 22  ALT 33 24  ALKPHOS 47 48  BILITOT 1.1 0.6  PROT 6.6 6.5  ALBUMIN 3.0* 2.9*     Lipase     Component Value Date/Time   LIPASE 16* 12/01/2014 2344     Studies/Results: No results found.  Medications: . acyclovir  400 mg Oral BID  . albuterol  2.5 mg Nebulization TID  . enoxaparin (LOVENOX) injection  40 mg Subcutaneous Q24H  . famotidine  20 mg Oral BID  . insulin aspart  0-15 Units Subcutaneous TID WC  . tamsulosin  0.4 mg Oral Daily  . topiramate  25 mg Oral BID  . triamcinolone  2 spray Nasal Daily    Assessment/Plan Acute cholecystitis, cholelithiasis S/p laparoscopic cholecystectomy, drain placement, 12/02/14 Dr. Harlow Asa  Morbid obesity with Body  mass index is 50.1 OSA/Hypoventilation syndrome/CPAP AODM  Glucose - 145 - 12/09/2014 Acute renal failure   Now having "bladder trouble".  She says that she is "double voiding"  I cannot resolve her symptoms with her physical findings. Antibiotics: Day 3 Cipro, pharmacy adjusting dose secondary to renal function. DVT: Lovenox/SCD  On isolation for MRSA   PCP:  Duwaine Maxin, DO   Plan:  I think she can go once she is cleared by Urology.  I will let her shower.   I will let her follow up with her PCP. And follow up with Dr. Harlow Asa.    LOS: 7 days    JENNINGS,WILLARD 12/09/2014  Agree with above. He bladder symptoms seem odd and, I think, are unrelated to her gall bladder surgery.  Alphonsa Overall, MD, [facility] Pager: [phone] Office phone:  [phone]

## 2014-12-09 NOTE — Progress Notes (Signed)
Patient has home CPAP unit and states that she places it on at night when she is ready. RT filled humidification chamber with sterile water and will continue to monitor.

## 2014-12-09 NOTE — Consult Note (Signed)
Urology Consult  Referring physician: Armandina Gemma, MD Reason for referral: "Difficulty voiding"  History of Present Illness: Ana Long is approximately 1 week status post laparoscopic cholecystectomy. Postoperatively she has complaint of increased difficulty voiding. She tells me she has no significant prior urologic history. She has experienced some degree of hesitancy prior to surgery but her situation is worse status post her cholecystectomy. She denies any ongoing constipation issues. She does have morbid obesity as well as diabetes mellitus. She is complaining of increased hesitancy with intermittent voiding and need to double void sense her surgery. She was started on alpha-blocker therapy. This is helped marginally. She had an in and out cath for a post void residual today which was approximately 250 mL. Her body habitus did not allow for accurate ultrasound bladder scan. Routine urinalysis a few days ago was unremarkable.  Past Medical History  Diagnosis Date  . GERD (gastroesophageal reflux disease)   . Diabetes mellitus     intolerant to Metformin  . Herpes genital   . PCOS (polycystic ovarian syndrome)   . Morbid obesity with BMI of 50 to 60 07/23/2009  . Dry skin     hands   . Asthma     prn inhaler  . Arthritis     major joints  . Complication of anesthesia     states is hard to wake up post-op  . Family history of anesthesia complication     mother went into coma during c-section  . Sleep apnea, obstructive     uses CPAP nightly - had sleep study 08/22/2007  . Carpal tunnel syndrome of right wrist 09/2012  . Obesity hypoventilation syndrome ([place]) 10/30/2012  . OSA on CPAP      AHI was on  11-17-12 to 120/hr, titrated to 15 cm with 3 cm EPR/   . Seasonal allergies   . Obesity, morbid ([place]) 01/10/2014  . Rheumatoid arthritis [facility])    Past Surgical History  Procedure Laterality Date  . Hysteroscopy w/d&c  01-08-2002  . Carpal tunnel release Right 10/02/2012    Procedure: RIGHT  CARPAL TUNNEL RELEASE ENDOSCOPIC;  Surgeon: Jolyn Nap, MD;  Location: [facility];  Service: Orthopedics;  Laterality: Right;  . Cholecystectomy N/A 12/02/2014    Procedure: LAPAROSCOPIC CHOLECYSTECTOMY;  Surgeon: Armandina Gemma, MD;  Location: [facility];  Service: General;  Laterality: N/A;    Medications:  Scheduled: . acyclovir  400 mg Oral BID  . albuterol  2.5 mg Nebulization TID  . enoxaparin (LOVENOX) injection  40 mg Subcutaneous Q24H  . famotidine  20 mg Oral BID  . insulin aspart  0-15 Units Subcutaneous TID WC  . tamsulosin  0.4 mg Oral Daily  . topiramate  25 mg Oral BID  . triamcinolone  2 spray Nasal Daily    Allergies:  Allergies  Allergen Reactions  . Citrus Hives and Itching  . Latex Swelling  . Penicillins Hives and Itching  . Soap Hives, Itching and Swelling    PUREX LAUNDRY DETERGENT  . Tomato Hives and Itching  . Fluticasone     Per patient, makes sneeze and gag  . Other Itching    Chocolate, Bananas, Acidic foods  . Shellfish Allergy   . Doxycycline Other (See Comments)    AFFECTS JOINTS  . Metformin And Related Rash    GI UPSET    Family History  Problem Relation Age of Onset  . Other Mother   . Diabetes Maternal Aunt   . Heart attack Maternal  Aunt   . Alzheimer's disease Maternal Uncle   . Cancer Maternal Uncle     prostate  . Diabetes Paternal Aunt   . Cancer Paternal Aunt     brain,mouth  . Alzheimer's disease Paternal Aunt   . Diabetes Paternal Uncle   . Diabetes Maternal Grandmother   . Diabetes Maternal Grandfather   . Leukemia Cousin   . Other Other   . Obesity Other   . Other Other     Social History:  reports that she quit smoking about 4 years ago. She has never used smokeless tobacco. She reports that she drinks alcohol. She reports that she does not use illicit drugs.  ROS negative except for issues outlined above  Physical Exam:  Vital signs in last 24 hours: Temp:  [98 F (36.7 C)-98.7 F (37.1 C)]  98.7 F (37.1 C) (10/17 0615) Pulse Rate:  [85-86] 86 (10/17 0615) Resp:  [17-18] 17 (10/17 0615) BP: (135-140)/(60-69) 135/60 mmHg (10/17 0615) SpO2:  [95 %-100 %] 95 % (10/17 1424) Weight:  [130.953 kg (288 lb 11.2 oz)] 130.953 kg (288 lb 11.2 oz) (10/17 0615)  Constitutional: Vital signs reviewed. WD WN in NAD Head: Normocephalic and atraumatic   Eyes: PERRL, No scleral icterus.  Neck: Supple No  Gross JVD Cardiovascular: RRR Pulmonary/Chest: Normal effort Abdominal:   Genitourinary: Not examined Extremities: No cyanosis or edema  Neurological: Grossly non-focal.  Skin: Warm,very dry and intact. No rash, cyanosis   Laboratory Data:  Results for orders placed or performed during the hospital encounter of 12/01/14 (from the past 72 hour(s))  Glucose, capillary     Status: Abnormal   Collection Time: 12/06/14 10:22 PM  Result Value Ref Range   Glucose-Capillary 135 (H) 65 - 99 mg/dL  Basic metabolic panel     Status: Abnormal   Collection Time: 12/07/14  5:10 AM  Result Value Ref Range   Sodium 139 135 - 145 mmol/L   Potassium 4.6 3.5 - 5.1 mmol/L   Chloride 107 101 - 111 mmol/L   CO2 22 22 - 32 mmol/L   Glucose, Bld 205 (H) 65 - 99 mg/dL   BUN 18 6 - 20 mg/dL   Creatinine, Ser 2.13 (H) 0.44 - 1.00 mg/dL   Calcium 8.5 (L) 8.9 - 10.3 mg/dL   GFR calc non Af Amer 28 (L) >60 mL/min   GFR calc Af Amer 32 (L) >60 mL/min    Comment: (NOTE) The eGFR has been calculated using the CKD EPI equation. This calculation has not been validated in all clinical situations. eGFR's persistently <60 mL/min signify possible Chronic Kidney Disease.    Anion gap 10 5 - 15  Glucose, capillary     Status: Abnormal   Collection Time: 12/07/14  8:16 AM  Result Value Ref Range   Glucose-Capillary 207 (H) 65 - 99 mg/dL  Glucose, capillary     Status: Abnormal   Collection Time: 12/07/14 12:33 PM  Result Value Ref Range   Glucose-Capillary 162 (H) 65 - 99 mg/dL  Glucose, capillary      Status: Abnormal   Collection Time: 12/07/14  4:30 PM  Result Value Ref Range   Glucose-Capillary 127 (H) 65 - 99 mg/dL  Glucose, capillary     Status: Abnormal   Collection Time: 12/07/14  9:27 PM  Result Value Ref Range   Glucose-Capillary 142 (H) 65 - 99 mg/dL  Glucose, capillary     Status: Abnormal   Collection Time: 12/08/14  7:26 AM  Result Value Ref Range   Glucose-Capillary 187 (H) 65 - 99 mg/dL  Glucose, capillary     Status: Abnormal   Collection Time: 12/08/14  1:12 PM  Result Value Ref Range   Glucose-Capillary 120 (H) 65 - 99 mg/dL  Glucose, capillary     Status: Abnormal   Collection Time: 12/08/14  5:59 PM  Result Value Ref Range   Glucose-Capillary 135 (H) 65 - 99 mg/dL  Glucose, capillary     Status: Abnormal   Collection Time: 12/08/14  9:45 PM  Result Value Ref Range   Glucose-Capillary 169 (H) 65 - 99 mg/dL  Basic metabolic panel     Status: Abnormal   Collection Time: 12/09/14  5:44 AM  Result Value Ref Range   Sodium 139 135 - 145 mmol/L   Potassium 4.4 3.5 - 5.1 mmol/L   Chloride 105 101 - 111 mmol/L   CO2 25 22 - 32 mmol/L   Glucose, Bld 173 (H) 65 - 99 mg/dL   BUN 21 (H) 6 - 20 mg/dL   Creatinine, Ser 1.45 (H) 0.44 - 1.00 mg/dL   Calcium 8.9 8.9 - 10.3 mg/dL   GFR calc non Af Amer 44 (L) >60 mL/min   GFR calc Af Amer 51 (L) >60 mL/min    Comment: (NOTE) The eGFR has been calculated using the CKD EPI equation. This calculation has not been validated in all clinical situations. eGFR's persistently <60 mL/min signify possible Chronic Kidney Disease.    Anion gap 9 5 - 15  Glucose, capillary     Status: Abnormal   Collection Time: 12/09/14  7:14 AM  Result Value Ref Range   Glucose-Capillary 158 (H) 65 - 99 mg/dL  Glucose, capillary     Status: Abnormal   Collection Time: 12/09/14 11:40 AM  Result Value Ref Range   Glucose-Capillary 145 (H) 65 - 99 mg/dL   Recent Results (from the past 240 hour(s))  Surgical pcr screen     Status: Abnormal    Collection Time: 12/02/14 11:08 AM  Result Value Ref Range Status   MRSA, PCR POSITIVE (A) NEGATIVE Final    Comment: RESULT CALLED TO, READ BACK BY AND VERIFIED WITH: P.CORBETT AT 1405 ON 12/02/14 BY S.VANHOORNE    Staphylococcus aureus POSITIVE (A) NEGATIVE Final    Comment:        The Xpert SA Assay (FDA approved for NASAL specimens in patients over 39 years of age), is one component of a comprehensive surveillance program.  Test performance has been validated by [facility] for patients greater than or equal to 57 year old. It is not intended to diagnose infection nor to guide or monitor treatment.   Culture, Urine     Status: None   Collection Time: 12/04/14 12:28 PM  Result Value Ref Range Status   Specimen Description URINE, RANDOM  Final   Special Requests Normal  Final   Culture   Final    NO GROWTH 1 DAY Performed at [facility]    Report Status 12/05/2014 FINAL  Final   Creatinine:  Recent Labs  12/03/14 0353 12/04/14 0525 12/05/14 0520 12/06/14 0523 12/07/14 0510 12/09/14 0544  CREATININE 1.64* 3.31* 3.43* 2.75* 2.13* 1.45*   Baseline Creatinine:   Impression/Assessment:  Voiding dysfunction with incomplete bladder emptying. Postvoid residual is elevated but not a point where she requires a indwelling Foley catheter. I suspect her voiding dysfunction is secondary to some decrease in detrusor contractility. It is conceivable that she could  have some dysfunctional voiding with failure to relax her bladder neck/external sphincter. If her voiding complaints persist then she would require video urodynamics to sort out her situation. There is really no other empiric medication would start at this time. She should go home on alpha blocker therapy. She should avoid narcotic pain medication as much as possible and treat any constipation that develops. Were happy to assess her in our office in the in next few weeks with the first available provider. We  will reassess post void residual and then arrange for video urodynamics if she shows ongoing signs of incomplete bladder emptying as well as subjective complaints of voiding dysfunction.  Plan:  Outpatient evaluation and treatment  Sharri Loya S 12/09/2014, 5:16 PM

## 2014-12-09 NOTE — Progress Notes (Signed)
Pt voided and immediately notified this RN, no collection/measuring device in toilet.   In and out cath performed to check post residural urine per MD order - total of 250cc of clear/yellow urine.  Pt tolerated procedure well.

## 2014-12-09 NOTE — Discharge Summary (Signed)
Physician Discharge Summary  Patient ID: Ana Long MRN: [id number] DOB/AGE: 41-25-1977 41 y.o.  Admit date: 12/01/2014 Discharge date: 12/10/2014  Admission Diagnoses:  Acute cholecystitis OSA AODM Morbid obesity Body mass index is 52.8  Discharge Diagnoses:  Acute cholecystitis, cholelithiasis S/p laparoscopic cholecystectomy, drain placement, 12/02/14 Dr. Harlow Asa Morbid obesity with Body mass index is 50.1 OSA/Hypoventilation syndrome/CPAP AODM Acute renal failure of uncertain etiology now resolving Trouble voiding  Principal Problem:   Gallbladder calculus with acute cholecystitis Active Problems:   AKI (acute kidney injury) ([place])   Genital herpes   Diabetes mellitus type 2, controlled ([place])   OSA on CPAP   Urinary retention   Hyperlipidemia   GERD   Osteoarthritis, knee   DUB (dysfunctional uterine bleeding)   Asthma, mild intermittent   PROCEDURES: Laparoscopic cholecystectomy  Hospital Course:  This is a 39 yo morbidly obese black female who has a BMI of 50 who woke up on Friday with some abdominal bloating and some pain in her epigastrium. She then developed significant nausea and vomiting. She thought she had gastroenteritis and was trying to control it at home. The nausea and vomiting improved some on Saturday and she just had some hot and cold chills. On Sunday the nausea and vomiting returned with significant epigastric pain. She decided to come in to the [facility] for further evaluation. She has been found to have a WBC of 15K, normal LFTs, but a CT scan indicative of acute cholecystitis. We have been asked to evaluate the patient for surgical admission. ROS: Please see HPI, otherwise, she wears a CPAP at night and has had some SOB secondary to pain this weekend. Decrease urine output since yesterday. No BM since Friday. Otherwise all other systems are currently negative.  Pt was admitted and see by Pulmonary because of her sleep apnea, she was  On CPAP and this was continued.  She was taken to the OR on 10/10 and cholecystectomy.  She did well from the surgery.  She had some post op SOB, but this was treated with CPAP. Her creatinine also started to rise.  The IV fluids were increased.  The second post op day her creatinine jumped up to 3.31 from an admit creatinine of 0.56.   UA was unremarkable, and she was on no nephrotoxic drugs at that time. She had some Vancomycin just after admit.  Medicine was ask to see and found no reason for her creatinine jump.  She was hydrated and creatinine peaked on 10/12 at 3.43.  It continue to improve.  She started complaining of trouble voiding 10/13 & 12/06/14.  Urology was called on 10/14, and recommended Flomax.  Her creatinine continues to improve, her labs all look good and by AM 10/17 her major complaint is her trouble voiding.  She has been on Flomax for 3 day, she is voiding good volume, but says it is still very difficult to void.  Urology has been called and we will ask them to see pt before we allow her to go home.  From a surgical standpoint I would let her go home.  She was seen by Dr. Grayce Sessions and recommended she stay on the Alpha blocker and follow up in his office if symptoms persist.  We planned to send her home AM of discharge, but she had some nausea and vomited.  We check her labs below and they were normal.  We changed her pain med to hydrocodone.  I instructed her not to take any NSAID, to follow  up with PCP about her creatinine and other medical issues.  She is to see Dr. Risa Grill and DR Harlow Asa in 2 weeks.   Condition on d/c:  Improved   Disposition: 01-Home or Self Care     Medication List    STOP taking these medications        ranitidine 150 MG tablet  Commonly known as:  ZANTAC      TAKE these medications        acetaminophen 325 MG tablet  Commonly known as:  TYLENOL  Take 1-2 tablets (325-650 mg total) by mouth every 6 (six) hours as needed for  fever, headache, mild pain or moderate pain.     acyclovir 400 MG tablet  Commonly known as:  ZOVIRAX  TAKE ONE TABLET BY MOUTH TWICE DAILY     PROAIR HFA 108 (90 BASE) MCG/ACT inhaler  Generic drug:  albuterol  Inhale 2 puffs into the lungs every 4 (four) hours as needed for wheezing or shortness of breath (cough).     albuterol 108 (90 BASE) MCG/ACT inhaler  Commonly known as:  VENTOLIN HFA  Inhale 1-2 puffs into the lungs every 4 (four) hours as needed for wheezing or shortness of breath.     atorvastatin 10 MG tablet  Commonly known as:  LIPITOR  Take 1 tablet (10 mg total) by mouth daily.     famotidine 20 MG tablet  Commonly known as:  PEPCID  Take 20 mg by mouth at bedtime.     glipiZIDE 5 MG tablet  Commonly known as:  GLUCOTROL  Take 1 tablet (5 mg total) by mouth daily before breakfast.     HYDROcodone-acetaminophen 5-325 MG tablet  Commonly known as:  NORCO/VICODIN  Take 1-2 tablets by mouth every 4 (four) hours as needed for moderate pain.     Insulin Glargine 100 UNIT/ML Solostar Pen  Commonly known as:  LANTUS SOLOSTAR  Inject 20 Units into the skin every morning.     loratadine 10 MG tablet  Commonly known as:  CLARITIN  Take 1 tablet (10 mg total) by mouth 2 (two) times daily.     ondansetron 4 MG disintegrating tablet  Commonly known as:  ZOFRAN-ODT  Take 1 tablet (4 mg total) by mouth every 8 (eight) hours as needed for nausea or vomiting.     phentermine 37.5 MG tablet  Commonly known as:  ADIPEX-P  Take 37.5 mg by mouth 2 times daily at 12 noon and 4 pm. Takes half tablet twice a day     PROBIOTIC DAILY PO  Take 2 capsules by mouth every evening.     senna 8.6 MG Tabs tablet  Commonly known as:  SENOKOT  Take 2 tablets by mouth at bedtime.     tamsulosin 0.4 MG Caps capsule  Commonly known as:  FLOMAX  Take 1 capsule (0.4 mg total) by mouth daily.     topiramate 25 MG tablet  Commonly known as:  TOPAMAX  Take 25 mg by mouth 2 (two) times  daily.     triamcinolone 55 MCG/ACT Aero nasal inhaler  Commonly known as:  NASACORT AQ  Place 2 sprays into the nose daily.     TURMERIC PO  Take 2 tablets by mouth at bedtime. Takes generic turmeric       Follow-up Information    Follow up with [name], MD. Schedule an appointment as soon as possible for a visit in 3 weeks.   Specialty:  General Surgery  Contact information:   [address] [place] [address] [phone]       Follow up with Duwaine Maxin, DO.   Specialty:  Internal Medicine   Why:  Call and let Dr. Redmond Pulling see you in next 2-3 weeks to go over medical issues. Especially your diabetes and to recheck your kidney function.   Contact information:   [place] [address] [phone]       Schedule an appointment as soon as possible for a visit with Bernestine Amass, MD.   Specialty:  Urology   Why:  Call and make an appointment in 1-2 week as you discussed for ongoing issues with your voiding   Contact information:   [place]  [address] [phone]       Signed: [name] 12/10/2014, 2:01 PM  Agree with above.  Alphonsa Overall, MD, [facility] Surgery Pager: [phone] Office phone:  [phone]

## 2014-12-09 NOTE — Assessment & Plan Note (Signed)
Post op

## 2014-12-09 NOTE — Progress Notes (Signed)
PT Cancellation Note  Patient Details Name: Ana Long MRN: [id number] DOB: 1975/07/26   Cancelled Treatment:    Reason Eval/Treat Not Completed:Attempted PT tx session-pt declined to participate due to having visitor present. Will check back if schedule allows. Thanks.    Weston Anna, MPT Pager: [phone]

## 2014-12-10 LAB — COMPREHENSIVE METABOLIC PANEL
ALT: 23 U/L (ref 14–54)
ANION GAP: 10 (ref 5–15)
AST: 22 U/L (ref 15–41)
Albumin: 3.3 g/dL — ABNORMAL LOW (ref 3.5–5.0)
Alkaline Phosphatase: 47 U/L (ref 38–126)
BILIRUBIN TOTAL: 0.3 mg/dL (ref 0.3–1.2)
BUN: 21 mg/dL — ABNORMAL HIGH (ref 6–20)
CHLORIDE: 104 mmol/L (ref 101–111)
CO2: 23 mmol/L (ref 22–32)
Calcium: 8.8 mg/dL — ABNORMAL LOW (ref 8.9–10.3)
Creatinine, Ser: 1.23 mg/dL — ABNORMAL HIGH (ref 0.44–1.00)
GFR calc Af Amer: >60 mL/min (ref >60)
GFR, EST NON AFRICAN AMERICAN: 54 mL/min — AB (ref >60)
Glucose, Bld: 199 mg/dL — ABNORMAL HIGH (ref 65–99)
POTASSIUM: 3.8 mmol/L (ref 3.5–5.1)
Sodium: 137 mmol/L (ref 135–145)
TOTAL PROTEIN: 7.4 g/dL (ref 6.5–8.1)

## 2014-12-10 LAB — CBC
HEMATOCRIT: 28 % — AB (ref 36.0–46.0)
Hemoglobin: 8.9 g/dL — ABNORMAL LOW (ref 12.0–15.0)
MCH: 25.5 pg — ABNORMAL LOW (ref 26.0–34.0)
MCHC: 31.8 g/dL (ref 30.0–36.0)
MCV: 80.2 fL (ref 78.0–100.0)
PLATELETS: 374 10*3/uL (ref 150–400)
RBC: 3.49 MIL/uL — ABNORMAL LOW (ref 3.87–5.11)
RDW: 14.5 % (ref 11.5–15.5)
WBC: 8.5 10*3/uL (ref 4.0–10.5)

## 2014-12-10 LAB — GLUCOSE, CAPILLARY
GLUCOSE-CAPILLARY: 151 mg/dL — AB (ref 65–99)
GLUCOSE-CAPILLARY: 181 mg/dL — AB (ref 65–99)

## 2014-12-10 MED ORDER — TAMSULOSIN HCL 0.4 MG PO CAPS: 0.4000 mg | ORAL_CAPSULE | Freq: Every day | ORAL | Status: DC

## 2014-12-10 MED ORDER — HYDROCODONE-ACETAMINOPHEN 5-325 MG PO TABS: 1.0000 | ORAL_TABLET | ORAL | Status: DC | PRN

## 2014-12-10 MED ORDER — ONDANSETRON 4 MG PO TBDP: 4.0000 mg | ORAL_TABLET | Freq: Three times a day (TID) | ORAL | Status: DC | PRN

## 2014-12-10 MED ORDER — ONDANSETRON 4 MG PO TBDP
4.0000 mg | ORAL_TABLET | Freq: Three times a day (TID) | ORAL | Status: DC | PRN
  Filled 2014-12-10: qty 1

## 2014-12-10 NOTE — Progress Notes (Signed)
Patient tolerating regular diet, pain controlled with oral medications, ambulating in hall, surgical incisions unremarkable.  Discharge instructions reviewed with patient, prescriptions given.  Patient discharge to home.

## 2014-12-10 NOTE — Progress Notes (Signed)
8 Days Post-Op  Subjective: Vomited x 2 this AM, still has nausea, glucose is in good  Control.  She says she is not constipated, she is very upset at the thought of stopping oxycodone.  She looks fine now  Objective: Vital signs in last 24 hours: Temp:  [97.5 F (36.4 C)-98.5 F (36.9 C)] 98.5 F (36.9 C) (10/18 0701) Pulse Rate:  [88-97] 88 (10/18 0701) Resp:  [19-20] 19 (10/18 0701) BP: (149)/(80-87) 149/80 mmHg (10/18 0701) SpO2:  [93 %-100 %] 93 % (10/18 0741) FiO2 (%):  [21 %] 21 % (10/18 0741) Last BM Date: 12/10/14 3000 urine yesterday Afebrile, VSS No labs 250 ml post void residual via cath Intake/Output from previous day: 10/17 0701 - 10/18 0700 In: 360 [P.O.:360] Out: 3000 [Urine:3000] Intake/Output this shift:    General appearance: alert, cooperative and no distress Resp: clear to auscultation bilaterally GI: soft, + BS, sites look fine she is tender as usual, +BM and flatus  Lab Results:  No results for input(s): WBC, HGB, HCT, PLT in the last 72 hours.  BMET  Recent Labs  12/09/14 0544  NA 139  K 4.4  CL 105  CO2 25  GLUCOSE 173*  BUN 21*  CREATININE 1.45*  CALCIUM 8.9   PT/INR No results for input(s): LABPROT, INR in the last 72 hours.   Recent Labs Lab 12/05/14 0520  AST 22  ALT 24  ALKPHOS 48  BILITOT 0.6  PROT 6.5  ALBUMIN 2.9*     Lipase     Component Value Date/Time   LIPASE 16* 12/01/2014 2344     Studies/Results: No results found.  Medications: . acyclovir  400 mg Oral BID  . albuterol  2.5 mg Nebulization TID  . enoxaparin (LOVENOX) injection  40 mg Subcutaneous Q24H  . famotidine  20 mg Oral BID  . insulin aspart  0-15 Units Subcutaneous TID WC  . tamsulosin  0.4 mg Oral Daily  . topiramate  25 mg Oral BID  . triamcinolone  2 spray Nasal Daily    Assessment/Plan Acute cholecystitis, cholelithiasis  S/p laparoscopic cholecystectomy, drain placement, 12/02/14 Dr. Harlow Asa Morbid obesity with Body mass index is  50.1 OSA/Hypoventilation syndrome/CPAP AODM Glucose - 145 - 12/09/2014  Acute renal failure  Antibiotics: Day 3 Cipro, pharmacy adjusting dose secondary to renal function. DVT: Lovenox/SCD PCP: Duwaine Maxin, DO  Dr, Risa Grill has seen and made recommendations to continue Flomax, avoid constipation and narcotics   Plan:  I will recheck her labs look to see if she has some pancreatitis, I told her to eat lunch if she felt better and we would see howl she is after that. Received a call and she is anxious to go home now, will discharge.  Labs OK.    LOS: 8 days    JENNINGS,WILLARD 12/10/2014   Agree with above.  Alphonsa Overall, MD, [facility] Surgery Pager: [phone] Office phone:  [phone]

## 2014-12-23 NOTE — Addendum Note (Signed)
Addendum  created 12/23/14 1223 by Franne Grip, MD   Modules edited: Anesthesia Responsible Staff

## 2014-12-25 ENCOUNTER — Other Ambulatory Visit: Payer: Self-pay | Admitting: Internal Medicine

## 2014-12-25 DIAGNOSIS — R339 Retention of urine, unspecified: Secondary | ICD-10-CM

## 2014-12-26 ENCOUNTER — Other Ambulatory Visit: Payer: Self-pay | Admitting: Internal Medicine

## 2014-12-30 ENCOUNTER — Encounter: Payer: Self-pay | Admitting: Internal Medicine

## 2014-12-30 ENCOUNTER — Ambulatory Visit (INDEPENDENT_AMBULATORY_CARE_PROVIDER_SITE_OTHER): Payer: Medicaid Other | Admitting: Internal Medicine

## 2014-12-30 VITALS — BP 124/64 | HR 91 | Temp 98.4°F | Ht 62.0 in | Wt 278.0 lb

## 2014-12-30 DIAGNOSIS — R339 Retention of urine, unspecified: Secondary | ICD-10-CM | POA: Diagnosis not present

## 2014-12-30 DIAGNOSIS — N181 Chronic kidney disease, stage 1: Secondary | ICD-10-CM | POA: Diagnosis not present

## 2014-12-30 DIAGNOSIS — K5900 Constipation, unspecified: Secondary | ICD-10-CM | POA: Diagnosis not present

## 2014-12-30 DIAGNOSIS — K5901 Slow transit constipation: Secondary | ICD-10-CM

## 2014-12-30 DIAGNOSIS — K8000 Calculus of gallbladder with acute cholecystitis without obstruction: Secondary | ICD-10-CM

## 2014-12-30 DIAGNOSIS — E1129 Type 2 diabetes mellitus with other diabetic kidney complication: Secondary | ICD-10-CM

## 2014-12-30 HISTORY — DX: Constipation, unspecified: K59.00

## 2014-12-30 LAB — GLUCOSE, CAPILLARY: Glucose-Capillary: 134 mg/dL — ABNORMAL HIGH (ref 65–99)

## 2014-12-30 MED ORDER — SENNOSIDES-DOCUSATE SODIUM 8.6-50 MG PO TABS: 2.0000 | ORAL_TABLET | Freq: Every day | ORAL | Status: DC

## 2014-12-30 NOTE — Assessment & Plan Note (Signed)
Patient had an AKI during admission which improved from 3 to 1.2. Her baseline appears to be 0.6-0.8.   Plan: -Check CMET

## 2014-12-30 NOTE — Assessment & Plan Note (Addendum)
Patient presents with constipation since her CCY in October. Patient states she has not had a well formed bowel movement in 2 weeks, but has been passing small "squirts." She is passing flatus. She has tried Miralax for 5 days without relief. She tries to ambulate to move her bowels without much relief. She has nausea, sour stomach, indigestions, eructation and decreased appetite, but she is able to tolerate meals and liquid without vomiting. She has vague abdominal pain worst in the RUQ, but states she feels full and bloated. She admits to darker stool, but she has been using Pepto-bismol. She is also using famotidine for GERD and Zofran for nausea. She was seen by her surgeon who felt she was recovering slowly. He recommended GI follow up. History and physical are not concerning for ileus or SBO. Patient has few narcotics post surgery that could be contributing. Doubt other medications are causing constipations. Also would expect diarrhea after CCY, not constipation. Iron-deficiency anemia has been associated with IBD, consider checking ferritin at follow up visit.   Plan: -Senokot-S 2 tabs QHS -Can try something stronger such as dulcolax suppositories or mag citrate if no relief -Consider checking ferritin at follow up visit -One week follow up -patient given warning symptoms of vomiting, severe abdominal pain, not passing stool or flatus to go to the ED -GI referral made, can likely discontinue if patient improves

## 2014-12-30 NOTE — Assessment & Plan Note (Signed)
Patient has complained of decreased urination since surgery. She was evaluated by urology 5 days ago who felt her urinary retention was secondary to constipation and prescribed her Miralax daily. She has not had relief with this.   Plan: -Check CMET -Senokot-S 2 tabs QHS

## 2014-12-30 NOTE — Patient Instructions (Signed)
TAKE SENOKOT-S 2 TABS AT NIGHT FOR CONSTIPATION.  FOLLOW UP IN ONE WEEK.

## 2014-12-30 NOTE — Progress Notes (Signed)
Subjective:    Patient ID: Ana Long, female    DOB: 1975/08/25, 39 y.o.   MRN: [id number]  HPI Ana Long is a 39 y.o. female with PMHx of GERD and asthma who presents to the clinic for hospital follow up after CCY from acute cholecystitis in October. Please see A&P for the status of the patient's chronic medical problems.   Past Medical History  Diagnosis Date  . GERD (gastroesophageal reflux disease)   . Diabetes mellitus     intolerant to Metformin  . Herpes genital   . PCOS (polycystic ovarian syndrome)   . Morbid obesity with BMI of 50 to 60 07/23/2009  . Dry skin     hands   . Asthma     prn inhaler  . Arthritis     major joints  . Complication of anesthesia     states is hard to wake up post-op  . Family history of anesthesia complication     mother went into coma during c-section  . Sleep apnea, obstructive     uses CPAP nightly - had sleep study 08/22/2007  . Carpal tunnel syndrome of right wrist 09/2012  . Obesity hypoventilation syndrome ([place]) 10/30/2012  . OSA on CPAP      AHI was on  11-17-12 to 120/hr, titrated to 15 cm with 3 cm EPR/   . Seasonal allergies   . Obesity, morbid ([place]) 01/10/2014  . Rheumatoid arthritis [facility])     Outpatient Encounter Prescriptions as of 12/30/2014  Medication Sig  . acetaminophen (TYLENOL) 325 MG tablet Take 1-2 tablets (325-650 mg total) by mouth every 6 (six) hours as needed for fever, headache, mild pain or moderate pain.  Marland Kitchen acyclovir (ZOVIRAX) 400 MG tablet TAKE ONE TABLET BY MOUTH TWICE DAILY  . albuterol (PROAIR HFA) 108 (90 BASE) MCG/ACT inhaler Inhale 2 puffs into the lungs every 4 (four) hours as needed for wheezing or shortness of breath (cough).  Marland Kitchen albuterol (VENTOLIN HFA) 108 (90 BASE) MCG/ACT inhaler Inhale 1-2 puffs into the lungs every 4 (four) hours as needed for wheezing or shortness of breath.  Marland Kitchen atorvastatin (LIPITOR) 10 MG tablet TAKE 1 TABLET (10 MG TOTAL) BY MOUTH DAILY.  . famotidine  (PEPCID) 20 MG tablet Take 20 mg by mouth at bedtime.  Marland Kitchen glipiZIDE (GLUCOTROL) 5 MG tablet Take 1 tablet (5 mg total) by mouth daily before breakfast.  . HYDROcodone-acetaminophen (NORCO/VICODIN) 5-325 MG tablet Take 1-2 tablets by mouth every 4 (four) hours as needed for moderate pain.  . Insulin Glargine (LANTUS SOLOSTAR) 100 UNIT/ML Solostar Pen Inject 20 Units into the skin every morning.  . loratadine (CLARITIN) 10 MG tablet Take 1 tablet (10 mg total) by mouth 2 (two) times daily.  . ondansetron (ZOFRAN-ODT) 4 MG disintegrating tablet Take 1 tablet (4 mg total) by mouth every 8 (eight) hours as needed for nausea or vomiting.  . phentermine (ADIPEX-P) 37.5 MG tablet Take 37.5 mg by mouth 2 times daily at 12 noon and 4 pm. Takes half tablet twice a day  . Probiotic Product (PROBIOTIC DAILY PO) Take 2 capsules by mouth every evening.   . senna (SENOKOT) 8.6 MG TABS tablet Take 2 tablets by mouth at bedtime.  . senna-docusate (SENOKOT S) 8.6-50 MG tablet Take 2 tablets by mouth at bedtime.  . topiramate (TOPAMAX) 25 MG tablet Take 25 mg by mouth 2 (two) times daily.  Marland Kitchen triamcinolone (NASACORT AQ) 55 MCG/ACT AERO nasal inhaler Place 2 sprays into the  nose daily.  . TURMERIC PO Take 2 tablets by mouth at bedtime. Takes generic turmeric  . [DISCONTINUED] tamsulosin (FLOMAX) 0.4 MG CAPS capsule Take 1 capsule (0.4 mg total) by mouth daily.   No facility-administered encounter medications on file as of 12/30/2014.    Family History  Problem Relation Age of Onset  . Other Mother   . Diabetes Maternal Aunt   . Heart attack Maternal Aunt   . Alzheimer's disease Maternal Uncle   . Cancer Maternal Uncle     prostate  . Diabetes Paternal Aunt   . Cancer Paternal Aunt     brain,mouth  . Alzheimer's disease Paternal Aunt   . Diabetes Paternal Uncle   . Diabetes Maternal Grandmother   . Diabetes Maternal Grandfather   . Leukemia Cousin   . Other Other   . Obesity Other   . Other Other      Social History   Social History  . Marital Status: Divorced    Spouse Name: N/A  . Number of Children: 2  . Years of Education: 14   Occupational History  . UNEMPLOYED    Social History Main Topics  . Smoking status: Former Smoker -- 10 years    Quit date: 05/24/2010  . Smokeless tobacco: Never Used  . Alcohol Use: 0.0 oz/week    0 Standard drinks or equivalent per week     Comment: rarely  . Drug Use: No  . Sexual Activity: Not on file     Comment: Lives w/a partner   Other Topics Concern  . Not on file   Social History Narrative   Single, but lives with a partner   Has 2 children and cares for an infant during the day         Review of Systems General: Admits to decreased appetite. Denies fever, chills, fatigue Respiratory: Denies SOB, cough   Cardiovascular: Denies chest pain and palpitations.  Gastrointestinal: Admits to nausea, RUQ abdominal pain, constipation, indigestion, eructation, flatus and abdominal distension. Denies vomiting, diarrhea, blood in stool  Genitourinary: Admits to decreased urination. Denies dysuria, urgency, frequency, hematuria Neurological: Denies dizziness, headaches, weakness, lightheadedness     Objective:   Physical Exam Filed Vitals:   12/30/14 1601  BP: 124/64  Pulse: 91  Temp: 98.4 F (36.9 C)  TempSrc: Oral  Height: 5' 2"  (1.575 m)  Weight: 278 lb (126.1 kg)  SpO2: 98%   General: Vital signs reviewed.  Patient is obese, in no acute distress and cooperative with exam.  Cardiovascular: RRR, S1 normal, S2 normal, no murmurs, gallops, or rubs. Pulmonary/Chest: Clear to auscultation bilaterally, no wheezes, rales, or rhonchi. Abdominal: Soft, central obesity, mildly tender to palpation in RUQ, non-distended, BS hypoactive, no masses, organomegaly, or guarding present. Surgical scars well healed without signs of infection. Extremities: No lower extremity edema bilaterally, pulses symmetric and intact bilaterally.  Skin:  Warm, dry and intact. No rashes or erythema.     Assessment & Plan:   Please see problem based assessment and plan.

## 2014-12-31 ENCOUNTER — Telehealth: Payer: Self-pay | Admitting: *Deleted

## 2014-12-31 LAB — CMP14 + ANION GAP
A/G RATIO: 1.3 (ref 1.1–2.5)
ALT: 21 IU/L (ref 0–32)
AST: 9 IU/L (ref 0–40)
Albumin: 3.9 g/dL (ref 3.5–5.5)
Alkaline Phosphatase: 54 IU/L (ref 39–117)
Anion Gap: 16 mmol/L (ref 10.0–18.0)
BUN/Creatinine Ratio: 15 (ref 9–23)
BUN: 11 mg/dL (ref 6–24)
Bilirubin Total: 0.3 mg/dL (ref 0.0–1.2)
CO2: 22 mmol/L (ref 18–29)
CREATININE: 0.73 mg/dL (ref 0.57–1.00)
Calcium: 9.3 mg/dL (ref 8.7–10.2)
Chloride: 100 mmol/L (ref 97–106)
GFR calc Af Amer: 119 mL/min/{1.73_m2} (ref >59)
GFR, EST NON AFRICAN AMERICAN: 103 mL/min/{1.73_m2} (ref >59)
GLOBULIN, TOTAL: 3 g/dL (ref 1.5–4.5)
Glucose: 168 mg/dL — ABNORMAL HIGH (ref 65–99)
POTASSIUM: 4 mmol/L (ref 3.5–5.2)
SODIUM: 138 mmol/L (ref 136–144)
TOTAL PROTEIN: 6.9 g/dL (ref 6.0–8.5)

## 2014-12-31 NOTE — Telephone Encounter (Signed)
Call to patient given appointment with Dr.  Redmond Pulling on 01/08/2015 at 2:45 PM.  Patient voiced understanding of the appointment .  Sander Nephew, RN 12/31/2014 10:14 AM

## 2014-12-31 NOTE — Progress Notes (Signed)
Internal Medicine Clinic Attending  Case discussed with Dr. Marvel Plan soon after the resident saw the patient.  We reviewed the resident's history and exam and pertinent patient test results.  I agree with the assessment, diagnosis, and plan of care documented in the resident's note.

## 2015-01-05 ENCOUNTER — Other Ambulatory Visit: Payer: Self-pay | Admitting: Internal Medicine

## 2015-01-06 ENCOUNTER — Other Ambulatory Visit: Payer: Self-pay | Admitting: Internal Medicine

## 2015-01-06 DIAGNOSIS — E119 Type 2 diabetes mellitus without complications: Secondary | ICD-10-CM

## 2015-01-06 DIAGNOSIS — Z794 Long term (current) use of insulin: Principal | ICD-10-CM

## 2015-01-06 MED ORDER — INSULIN GLARGINE 100 UNIT/ML SOLOSTAR PEN: 20.0000 [IU] | PEN_INJECTOR | Freq: Every morning | SUBCUTANEOUS | Status: DC

## 2015-01-07 ENCOUNTER — Encounter: Payer: Self-pay | Admitting: *Deleted

## 2015-01-08 ENCOUNTER — Encounter: Payer: Medicaid Other | Admitting: Internal Medicine

## 2015-01-08 ENCOUNTER — Ambulatory Visit (INDEPENDENT_AMBULATORY_CARE_PROVIDER_SITE_OTHER): Payer: Medicaid Other | Admitting: Dietician

## 2015-01-08 DIAGNOSIS — E1122 Type 2 diabetes mellitus with diabetic chronic kidney disease: Secondary | ICD-10-CM

## 2015-01-08 DIAGNOSIS — Z794 Long term (current) use of insulin: Secondary | ICD-10-CM

## 2015-01-08 DIAGNOSIS — N181 Chronic kidney disease, stage 1: Secondary | ICD-10-CM

## 2015-01-08 NOTE — Progress Notes (Signed)
Patient is tearful at her visit today. Her concern is her abdomen pain and the labs that were done at her last visit. She says it hurts more when she urinates. She has had a few small bowel movements since last visit. CDE downloaded her meter. She did not want to discuss her diet except to say she is not eating much due to pain, gas, bloating. We did not review her diabetes medicine. She did not want to weigh    CDE showed her the labs from her last visit and her meter download  Meter download shows average of 136 and range of 86 to 207 mg/dl. 95.6% in at target, no lows and 4.4% above target range.

## 2015-01-13 ENCOUNTER — Ambulatory Visit (INDEPENDENT_AMBULATORY_CARE_PROVIDER_SITE_OTHER): Payer: Medicaid Other | Admitting: Adult Health

## 2015-01-13 ENCOUNTER — Encounter: Payer: Self-pay | Admitting: Adult Health

## 2015-01-13 VITALS — BP 147/90 | HR 95 | Ht 62.0 in | Wt 279.0 lb

## 2015-01-13 DIAGNOSIS — Z9989 Dependence on other enabling machines and devices: Principal | ICD-10-CM

## 2015-01-13 DIAGNOSIS — G4733 Obstructive sleep apnea (adult) (pediatric): Secondary | ICD-10-CM

## 2015-01-13 NOTE — Progress Notes (Signed)
I agree with the assessment and plan as directed by NP .The patient is known to me .   Liliane Mallis, MD  

## 2015-01-13 NOTE — Progress Notes (Signed)
PATIENT: Ana Long DOB: 26-Nov-1975  REASON FOR VISIT: follow up- obstructive sleep apnea, obesity HISTORY FROM: patient  HISTORY OF PRESENT ILLNESS: Ana Long is a 39 year old female with a history of obstructive sleep apnea on CPAP. She returns today for a compliance download. Unfortunately we were unable to obtain a download from her memory card. Patient states that she has been using her machine nightly. She states that she used it less frequently in the last week but otherwise she uses it regularly. She states that she usually goes to bed between 2-8 AM. She arises between 11 and 12 PM. She states that she usually gets up 2 or 3 times a night to urinate. Her Epworth sleepiness score is 15 and fatigue severity score is 40. Patient does state that her machine has got water in the twos. She states that one night she "almost drowned" due to the water in her tube. She states that she has called her DME but they have not returned her call. She denies any new neurological symptoms. She reports that she did have gallbladder surgery since the last visit. She reports that her diabetes is under better control. Her last hemoglobin A1c was 7.6.  HISTORY 01/10/14: Ana Long is a 39 y.o. female Is seen here as a referral/ revisit from Dr. Redmond Pulling and Cyndy Freeze , CRT at [place].  Ana Long is a 39 year old African American female patient of Dr. Duwaine Maxin. She reports that she was diagnosed with obstructive sleep apnea in the year 2002. The diagnosis was made at [facility] and followed with the CPAP prescription she has not had any adjustments to her CPAP since 2009 , but she had technical difficulties using the machine and by now it seems to have broken. She reports that the water reservoir has at times stopped functioning or became to hot, and that now she feels that the pressure provided night by night is very variable.   She presented to her D[facility] Company[facility] and was sent here for a reevaluation. Interval history. Ana Long was evaluated in a split-night Polysomnography dated 11-17-12 at [place]. She endorsed the Epworth Sleepiness Scale at 18 points. The patient helps Christinia at 10 points. Her BMI was 56.9 with a neck circumference of 22 inches. The patient was diagnosed with an AHI of 120- the same as her RDI.  An average of 30 seconds separated apnea -hypopnea spells. She had 80 obstructive apneas at night but nor centrals or mixed events.  CO2 level rose to a level of 51.44 torr - indicating that she had obesity hypoventilation, a term used to for patient but cannot exchange oxygen and CO2. The lowest oxygen desaturation was 73% but the saturation duration total time was only 16.8 minutes.  There were no periodic limb movements noted and her heart rate remained stable. The patient was titrated to 15 cm CPAP with an AHI of 0.0 the lowest oxygen saturation at this pressure was 89%. She slept for 35.4 minutes of the spinal setting and 22 of these sleep minutes were in REM sleep. She eventually gained normal sleep architecture back once on CPAP.   01-10-14 ; Ana Long endorsed today a sleepiness scale of 13 points down from 18 prior to CPAP use and a fatigue score of 31 points. She does not have any bathroom breaks at night, she does not wake up with a headache, still sometimes wakes up with a dry mouth, but not  to the same degree as prior to CPAP use. She adjusted humidity as needed. She is now able to drive longer distances without getting sleepy she reports. She also is able to stay awake as a passenger. She rarely will take a nap now and she doesn't schedule a plan for any naps in her daytime.  She feels that she is better able to concentrate and focus not fighting drowsiness- she is on the second day off a detox treatment but will result in a special diet. She has increased her water  intake thus she has occasionally a bathroom break. Download dated 01-10-14 shows 97% compliance for the use of 29 out of 30 days of CPAP CPAP with 15 cm water pressure with 3 cm EPR average daily time of use is 4 hours 25 minutes. She still has a lot of air leaks, the compliance for over 4 hours of nightly use is only 53%. It seems that the highest residual AHI days are those with highest air leaks. I    REVIEW OF SYSTEMS: Out of a complete 14 system review of symptoms, the patient complains only of the following symptoms, and all other reviewed systems are negative.  Chills, eye itching, light sensitivity, blurred vision, shortness of breath, drooling, apnea, frequent waking, walking difficulty, back pain, constipation, abdominal pain, environmental allergies, food allergies, difficulty urinating, painful urination  ALLERGIES: Allergies  Allergen Reactions  . Citrus Hives and Itching  . Latex Swelling  . Penicillins Hives and Itching  . Soap Hives, Itching and Swelling    PUREX LAUNDRY DETERGENT  . Tomato Hives and Itching  . Fluticasone     Per patient, makes sneeze and gag  . Other Itching    Chocolate, Bananas, Acidic foods  . Shellfish Allergy   . Doxycycline Other (See Comments)    AFFECTS JOINTS  . Metformin And Related Rash    GI UPSET    HOME MEDICATIONS: Outpatient Prescriptions Prior to Visit  Medication Sig Dispense Refill  . acetaminophen (TYLENOL) 325 MG tablet Take 1-2 tablets (325-650 mg total) by mouth every 6 (six) hours as needed for fever, headache, mild pain or moderate pain.    Marland Kitchen acyclovir (ZOVIRAX) 400 MG tablet TAKE ONE TABLET BY MOUTH TWICE DAILY 60 tablet 0  . albuterol (PROAIR HFA) 108 (90 BASE) MCG/ACT inhaler Inhale 2 puffs into the lungs every 4 (four) hours as needed for wheezing or shortness of breath (cough).    Marland Kitchen albuterol (VENTOLIN HFA) 108 (90 BASE) MCG/ACT inhaler Inhale 1-2 puffs into the lungs every 4 (four) hours as needed for wheezing or  shortness of breath. 1 Inhaler 5  . atorvastatin (LIPITOR) 10 MG tablet TAKE 1 TABLET (10 MG TOTAL) BY MOUTH DAILY. 90 tablet 1  . famotidine (PEPCID) 20 MG tablet Take 20 mg by mouth at bedtime.    Marland Kitchen glipiZIDE (GLUCOTROL) 5 MG tablet Take 1 tablet (5 mg total) by mouth daily before breakfast. 90 tablet 1  . Insulin Glargine (LANTUS SOLOSTAR) 100 UNIT/ML Solostar Pen Inject 20 Units into the skin every morning. 15 mL 3  . loratadine (CLARITIN) 10 MG tablet Take 1 tablet (10 mg total) by mouth 2 (two) times daily. 60 tablet 1  . ondansetron (ZOFRAN-ODT) 4 MG disintegrating tablet Take 1 tablet (4 mg total) by mouth every 8 (eight) hours as needed for nausea or vomiting. 15 tablet 0  . phentermine (ADIPEX-P) 37.5 MG tablet Take 37.5 mg by mouth 2 times daily at 12 noon and 4  pm. Takes half tablet twice a day    . Probiotic Product (PROBIOTIC DAILY PO) Take 2 capsules by mouth every evening.     . senna-docusate (SENOKOT S) 8.6-50 MG tablet Take 2 tablets by mouth at bedtime. 30 tablet 0  . topiramate (TOPAMAX) 25 MG tablet Take 25 mg by mouth 2 (two) times daily.    Marland Kitchen triamcinolone (NASACORT AQ) 55 MCG/ACT AERO nasal inhaler Place 2 sprays into the nose daily. 1 Inhaler 12  . TURMERIC PO Take 2 tablets by mouth at bedtime. Takes generic turmeric    . HYDROcodone-acetaminophen (NORCO/VICODIN) 5-325 MG tablet Take 1-2 tablets by mouth every 4 (four) hours as needed for moderate pain. 40 tablet 0  . senna (SENOKOT) 8.6 MG TABS tablet Take 2 tablets by mouth at bedtime.     No facility-administered medications prior to visit.    PAST MEDICAL HISTORY: Past Medical History  Diagnosis Date  . GERD (gastroesophageal reflux disease)   . Diabetes mellitus     intolerant to Metformin  . Herpes genital   . PCOS (polycystic ovarian syndrome)   . Morbid obesity with BMI of 50 to 60 07/23/2009  . Dry skin     hands   . Asthma     prn inhaler  . Arthritis     major joints  . Complication of anesthesia      states is hard to wake up post-op  . Family history of anesthesia complication     mother went into coma during c-section  . Sleep apnea, obstructive     uses CPAP nightly - had sleep study 08/22/2007  . Carpal tunnel syndrome of right wrist 09/2012  . Obesity hypoventilation syndrome ([place]) 10/30/2012  . OSA on CPAP      AHI was on  11-17-12 to 120/hr, titrated to 15 cm with 3 cm EPR/   . Seasonal allergies   . Obesity, morbid ([place]) 01/10/2014  . Rheumatoid arthritis ([place])     PAST SURGICAL HISTORY: Past Surgical History  Procedure Laterality Date  . Hysteroscopy w/d&c  01-08-2002  . Carpal tunnel release Right 10/02/2012    Procedure: RIGHT CARPAL TUNNEL RELEASE ENDOSCOPIC;  Surgeon: Jolyn Nap, MD;  Location: [facility];  Service: Orthopedics;  Laterality: Right;  . Cholecystectomy N/A 12/02/2014    Procedure: LAPAROSCOPIC CHOLECYSTECTOMY;  Surgeon: Armandina Gemma, MD;  Location: [facility];  Service: General;  Laterality: N/A;    FAMILY HISTORY: Family History  Problem Relation Age of Onset  . Other Mother   . Diabetes Maternal Aunt   . Heart attack Maternal Aunt   . Alzheimer's disease Maternal Uncle   . Cancer Maternal Uncle     prostate  . Diabetes Paternal Aunt   . Cancer Paternal Aunt     brain,mouth  . Alzheimer's disease Paternal Aunt   . Diabetes Paternal Uncle   . Diabetes Maternal Grandmother   . Diabetes Maternal Grandfather   . Leukemia Cousin   . Other Other   . Obesity Other   . Other Other     SOCIAL HISTORY: Social History   Social History  . Marital Status: Divorced    Spouse Name: N/A  . Number of Children: 2  . Years of Education: 14   Occupational History  . UNEMPLOYED    Social History Main Topics  . Smoking status: Former Smoker -- 10 years    Quit date: 05/24/2010  . Smokeless tobacco: Never Used  . Alcohol Use: 0.0  oz/week    0 Standard drinks or equivalent per week     Comment: rarely  . Drug Use: No  . Sexual  Activity: Not on file     Comment: Lives w/a partner   Other Topics Concern  . Not on file   Social History Narrative   Single, but lives with a partner   Has 2 children and cares for an infant during the day            PHYSICAL EXAM  Filed Vitals:   01/13/15 1339  BP: 147/90  Pulse: 95  Height: 5' 2"  (1.575 m)  Weight: 279 lb (126.554 kg)   Body mass index is 51.02 kg/(m^2).  Generalized: Well developed, in no acute distress  Neck: Circumference 18 inches, Mallampati 4+  Neurological examination  Mentation: Alert oriented to time, place, history taking. Follows all commands speech and language fluent Cranial nerve II-XII: Pupils were equal round reactive to light. Extraocular movements were full, visual field were full on confrontational test. Facial sensation and strength were normal. Uvula tongue midline. Head turning and shoulder shrug  were normal and symmetric. Motor: The motor testing reveals 5 over 5 strength of all 4 extremities. Good symmetric motor tone is noted throughout.  Sensory: Sensory testing is intact to soft touch on all 4 extremities. No evidence of extinction is noted.  Coordination: Cerebellar testing reveals good finger-nose-finger and heel-to-shin bilaterally.  Gait and station: Gait is normal. Reflexes: Deep tendon reflexes are symmetric and normal bilaterally.   DIAGNOSTIC DATA (LABS, IMAGING, TESTING) - I reviewed patient records, labs, notes, testing and imaging myself where available.  Lab Results  Component Value Date   WBC 8.5 12/10/2014   HGB 8.9* 12/10/2014   HCT 28.0* 12/10/2014   MCV 80.2 12/10/2014   PLT 374 12/10/2014      Component Value Date/Time   NA 138 12/30/2014 1641   NA 137 12/10/2014 1020   K 4.0 12/30/2014 1641   CL 100 12/30/2014 1641   CO2 22 12/30/2014 1641   GLUCOSE 168* 12/30/2014 1641   GLUCOSE 199* 12/10/2014 1020   BUN 11 12/30/2014 1641   BUN 21* 12/10/2014 1020   CREATININE 0.73 12/30/2014 1641    CREATININE 0.60 07/24/2014 1550   CALCIUM 9.3 12/30/2014 1641   PROT 6.9 12/30/2014 1641   PROT 7.4 12/10/2014 1020   ALBUMIN 3.9 12/30/2014 1641   ALBUMIN 3.3* 12/10/2014 1020   AST 9 12/30/2014 1641   ALT 21 12/30/2014 1641   ALKPHOS 54 12/30/2014 1641   BILITOT 0.3 12/30/2014 1641   BILITOT 0.3 12/10/2014 1020   GFRNONAA 103 12/30/2014 1641   GFRNONAA >89 07/24/2014 1550   GFRAA 119 12/30/2014 1641   GFRAA >89 07/24/2014 1550   Lab Results  Component Value Date   CHOL 148 07/24/2014   HDL 41* 07/24/2014   LDLCALC 85 07/24/2014   TRIG 112 07/24/2014   CHOLHDL 3.6 07/24/2014   Lab Results  Component Value Date   HGBA1C 7.9* 12/04/2014       ASSESSMENT AND PLAN 39 y.o. year old female  has a past medical history of GERD (gastroesophageal reflux disease); Diabetes mellitus; Herpes genital; PCOS (polycystic ovarian syndrome); Morbid obesity with BMI of 50 to 60 (07/23/2009); Dry skin; Asthma; Arthritis; Complication of anesthesia; Family history of anesthesia complication; Sleep apnea, obstructive; Carpal tunnel syndrome of right wrist (09/2012); Obesity hypoventilation syndrome ([place]) (10/30/2012); OSA on CPAP; Seasonal allergies; Obesity, morbid ([place]) (01/10/2014); and Rheumatoid arthritis ([place]). here with;  1. Obstructive sleep apnea on CPAP 2. Obesity  We will contact the patient's DME company to request a download. We will also requested they follow-up with the patient in regards to her machine malfunctioning -- water in the tube and her memory card not working. If we are able to obtain a download from her DME I will with the patient over the results. Patient verbalized understanding. She will follow-up in 6 months with Dr. Mechele Claude, MSN, NP-C 01/13/2015, 2:01 PM [facility] [address], [place],  [address] [phone]

## 2015-01-13 NOTE — Patient Instructions (Signed)
We will contact DME in regards to humidification  Continue using the CPAP nightly. If your symptoms worsen or you develop new symptoms please let us know.

## 2015-01-22 ENCOUNTER — Encounter: Payer: Self-pay | Admitting: Internal Medicine

## 2015-01-22 ENCOUNTER — Ambulatory Visit (INDEPENDENT_AMBULATORY_CARE_PROVIDER_SITE_OTHER): Payer: Medicaid Other | Admitting: Internal Medicine

## 2015-01-22 VITALS — BP 125/67 | HR 79 | Temp 98.1°F | Wt 281.7 lb

## 2015-01-22 DIAGNOSIS — R339 Retention of urine, unspecified: Secondary | ICD-10-CM | POA: Diagnosis not present

## 2015-01-22 DIAGNOSIS — K5900 Constipation, unspecified: Secondary | ICD-10-CM | POA: Diagnosis not present

## 2015-01-22 DIAGNOSIS — K5901 Slow transit constipation: Secondary | ICD-10-CM

## 2015-01-22 DIAGNOSIS — E119 Type 2 diabetes mellitus without complications: Secondary | ICD-10-CM

## 2015-01-22 DIAGNOSIS — N941 Unspecified dyspareunia: Secondary | ICD-10-CM | POA: Diagnosis not present

## 2015-01-22 DIAGNOSIS — N181 Chronic kidney disease, stage 1: Principal | ICD-10-CM

## 2015-01-22 DIAGNOSIS — E1122 Type 2 diabetes mellitus with diabetic chronic kidney disease: Secondary | ICD-10-CM

## 2015-01-22 DIAGNOSIS — Z794 Long term (current) use of insulin: Secondary | ICD-10-CM | POA: Diagnosis not present

## 2015-01-22 DIAGNOSIS — Z7984 Long term (current) use of oral hypoglycemic drugs: Secondary | ICD-10-CM | POA: Diagnosis not present

## 2015-01-22 MED ORDER — DOCUSATE SODIUM 100 MG PO CAPS: 100.0000 mg | ORAL_CAPSULE | Freq: Every day | ORAL | Status: DC

## 2015-01-22 MED ORDER — LACTULOSE 10 G PO PACK: 10.0000 g | PACK | Freq: Every day | ORAL | Status: DC | PRN

## 2015-01-22 NOTE — Patient Instructions (Addendum)
1. Please make appointment to see your gynecologist and gastroenterology.  Please stop Senokot.  Since you had success with lactulose in the past we can try it daily as needed for the next week.  It may affect your blood sugar so try to limit its use.  Also, try adding daily colace to soften the stool.   2. Please take all medications as prescribed.   3. If you have worsening of your symptoms or new symptoms arise, please call the clinic ([phone]), or go to the ER immediately if symptoms are severe.  Come back to see me in 2 months or sooner if you are unable to get an appointment with your gynecologist.

## 2015-01-22 NOTE — Progress Notes (Signed)
Subjective:    Patient ID: Ana Long, female    DOB: 08-Apr-1975, 39 y.o.   MRN: [id number]  HPI Comments: Ana Long is a 40 year old woman with PMH as below here for follow-up of DM.  Please see problem based charting for status of this condition.     Past Medical History  Diagnosis Date  . GERD (gastroesophageal reflux disease)   . Diabetes mellitus     intolerant to Metformin  . Herpes genital   . PCOS (polycystic ovarian syndrome)   . Morbid obesity with BMI of 50 to 60 07/23/2009  . Dry skin     hands   . Asthma     prn inhaler  . Arthritis     major joints  . Complication of anesthesia     states is hard to wake up post-op  . Family history of anesthesia complication     mother went into coma during c-section  . Sleep apnea, obstructive     uses CPAP nightly - had sleep study 08/22/2007  . Carpal tunnel syndrome of right wrist 09/2012  . Obesity hypoventilation syndrome ([place]) 10/30/2012  . OSA on CPAP      AHI was on  11-17-12 to 120/hr, titrated to 15 cm with 3 cm EPR/   . Seasonal allergies   . Obesity, morbid ([place]) 01/10/2014  . Rheumatoid arthritis [facility])    Current Outpatient Prescriptions on File Prior to Visit  Medication Sig Dispense Refill  . acetaminophen (TYLENOL) 325 MG tablet Take 1-2 tablets (325-650 mg total) by mouth every 6 (six) hours as needed for fever, headache, mild pain or moderate pain.    Marland Kitchen acyclovir (ZOVIRAX) 400 MG tablet TAKE ONE TABLET BY MOUTH TWICE DAILY 60 tablet 0  . albuterol (PROAIR HFA) 108 (90 BASE) MCG/ACT inhaler Inhale 2 puffs into the lungs every 4 (four) hours as needed for wheezing or shortness of breath (cough).    Marland Kitchen albuterol (VENTOLIN HFA) 108 (90 BASE) MCG/ACT inhaler Inhale 1-2 puffs into the lungs every 4 (four) hours as needed for wheezing or shortness of breath. 1 Inhaler 5  . atorvastatin (LIPITOR) 10 MG tablet TAKE 1 TABLET (10 MG TOTAL) BY MOUTH DAILY. 90 tablet 1  . famotidine (PEPCID) 20 MG  tablet Take 20 mg by mouth at bedtime.    Marland Kitchen glipiZIDE (GLUCOTROL) 5 MG tablet Take 1 tablet (5 mg total) by mouth daily before breakfast. 90 tablet 1  . Insulin Glargine (LANTUS SOLOSTAR) 100 UNIT/ML Solostar Pen Inject 20 Units into the skin every morning. 15 mL 3  . loratadine (CLARITIN) 10 MG tablet Take 1 tablet (10 mg total) by mouth 2 (two) times daily. 60 tablet 1  . ondansetron (ZOFRAN-ODT) 4 MG disintegrating tablet Take 1 tablet (4 mg total) by mouth every 8 (eight) hours as needed for nausea or vomiting. 15 tablet 0  . phentermine (ADIPEX-P) 37.5 MG tablet Take 37.5 mg by mouth 2 times daily at 12 noon and 4 pm. Takes half tablet twice a day    . Probiotic Product (PROBIOTIC DAILY PO) Take 2 capsules by mouth every evening.     . senna-docusate (SENOKOT S) 8.6-50 MG tablet Take 2 tablets by mouth at bedtime. 30 tablet 0  . topiramate (TOPAMAX) 25 MG tablet Take 25 mg by mouth 2 (two) times daily.    Marland Kitchen triamcinolone (NASACORT AQ) 55 MCG/ACT AERO nasal inhaler Place 2 sprays into the nose daily. 1 Inhaler 12  . TURMERIC  PO Take 2 tablets by mouth at bedtime. Takes generic turmeric     No current facility-administered medications on file prior to visit.    Review of Systems  Constitutional: Negative for fever, chills and appetite change.  Gastrointestinal: Positive for nausea, abdominal pain and constipation. Negative for vomiting and blood in stool.       Last BM this AM.  Abdominal pain is improving.  Genitourinary: Positive for difficulty urinating, menstrual problem, pelvic pain and dyspareunia. Negative for hematuria and vaginal discharge.       Filed Vitals:   01/22/15 1603  BP: 125/67  Pulse: 79  Temp: 98.1 F (36.7 C)  TempSrc: Oral  Weight: 281 lb 11.2 oz (127.778 kg)  SpO2: 97%    Objective:   Physical Exam  Constitutional: She is oriented to person, place, and time. She appears well-developed. No distress.  HENT:  Head: Normocephalic and atraumatic.    Mouth/Throat: Oropharynx is clear and moist. No oropharyngeal exudate.  Eyes: EOM are normal. Pupils are equal, round, and reactive to light.  Neck: Neck supple.  Cardiovascular: Normal rate, regular rhythm and normal heart sounds.  Exam reveals no gallop and no friction rub.   No murmur heard. Pulmonary/Chest: Effort normal and breath sounds normal. No respiratory distress. She has no wheezes. She has no rales.  Abdominal: Soft. Bowel sounds are normal. She exhibits no distension. There is tenderness. There is no rebound and no guarding.  Mild TTP diffusely.  Trocar sites well healed, no erythema.  Musculoskeletal: Normal range of motion. She exhibits no edema or tenderness.  Neurological: She is alert and oriented to person, place, and time. No cranial nerve deficit.  Skin: Skin is warm. She is not diaphoretic.  Psychiatric: She has a normal mood and affect. Her behavior is normal. Judgment and thought content normal.  Vitals reviewed.         Assessment & Plan:  Please see problem based charting for A&P.

## 2015-01-24 ENCOUNTER — Other Ambulatory Visit: Payer: Self-pay | Admitting: Internal Medicine

## 2015-01-24 DIAGNOSIS — N941 Unspecified dyspareunia: Secondary | ICD-10-CM | POA: Insufficient documentation

## 2015-01-24 DIAGNOSIS — J302 Other seasonal allergic rhinitis: Secondary | ICD-10-CM

## 2015-01-24 MED ORDER — LORATADINE 10 MG PO TABS: 10.0000 mg | ORAL_TABLET | Freq: Two times a day (BID) | ORAL | Status: DC

## 2015-01-24 NOTE — Assessment & Plan Note (Signed)
Assessment:  She says the Senokot-S was initially working but she is constipated again.  She is nauseous but has good bowel sounds, not vomiting, passing gas, has resolving abdominal pain so no suspicion for SBO.  She is requesting lactulose because she had it before and it worked well. Plan: STOP Senokot-S.  RX sent for lactulose 31m daily prn (she knows to limit use especially given her DM).  She also has OTC docusate she can use daily while this is a problem.

## 2015-01-24 NOTE — Assessment & Plan Note (Addendum)
Assessment:  Toward the end of the visit she mentioned recently experiencing dyspareunia and feeling as if she is "always on her period."  She describes pelvic cramping and some intermittent spotting.  She denies fever, vomiting, vaginal discharge.  She recently had GB surgery so it is difficult to tell if nausea and abdominal pain are related to the surgery vs gyn pathology.  [facility]  Previous GC/CT testing in [facility] has been neg (most recent 06/2014) so I am less concerned for PID.  She is non-toxic appearing, afebrile and I don't think she needs emergent evaluation but I would like her to be seen as soon as possible for pelvic exam.   Plan:  She agrees to [facility] for first available appointment.  Will ask her to return to clinic next week if no appt with [facility] available.

## 2015-01-24 NOTE — Assessment & Plan Note (Signed)
Lab Results  Component Value Date   HGBA1C 7.9* 12/04/2014   HGBA1C 7.0 11/20/2014   HGBA1C 6.6 07/24/2014     Assessment: Diabetes control:   good control based on CBGs Progress toward A1C goal:   due for A1c next month Comments: She reports compliance with Lantus 20 units daily but says she took 50 units on Thanksgiving because of dietary indiscrection.  She has brought her meter.  Logs reviewed and she is averaging in the 130s with lows in the 80s and highest 180s.  She is doing very well and there is no need to change regimen.  Plan: Medications:  continue current medications:  Lantus 20 units qAM and glipizide 43m daily Home glucose monitoring:  yes Frequency:  BID Timing:   Instruction/counseling given: reminded to bring blood glucose meter & log to each visit Other plans: Continue diet and exercise.  RTC in 2 months

## 2015-01-24 NOTE — Assessment & Plan Note (Signed)
Assessment:  She is urinating but still often feels like she is not emptying completely.  She is due for follow-up with urology soon. Plan:  We are working on constipation issue with lactulose/colace.  She will follow-up with urology as scheduled.

## 2015-01-29 ENCOUNTER — Telehealth: Payer: Self-pay | Admitting: *Deleted

## 2015-01-29 NOTE — Telephone Encounter (Signed)
-----   Message from Francesca Oman, DO sent at 01/24/2015  1:14 PM EST ----- Joanne Gavel,  Can you please ask this patient if she was able to get appt with gynecology?  If not, please ask her to come see whoever is available in clinic first thing next week (I am booked until Dec 21).  Thank you, Cristie Hem

## 2015-02-04 ENCOUNTER — Other Ambulatory Visit: Payer: Self-pay | Admitting: Internal Medicine

## 2015-02-05 NOTE — Addendum Note (Signed)
Addended by: Hulan Fray on: 02/05/2015 06:58 PM   Modules accepted: Orders

## 2015-02-10 NOTE — Progress Notes (Signed)
Case discussed with Dr. Redmond Pulling soon after the resident saw the patient.  We reviewed the resident's history and exam and pertinent patient test results.  I agree with the assessment, diagnosis and plan of care documented in the resident's note.

## 2015-02-12 ENCOUNTER — Ambulatory Visit (INDEPENDENT_AMBULATORY_CARE_PROVIDER_SITE_OTHER): Payer: Medicaid Other | Admitting: Obstetrics & Gynecology

## 2015-02-12 ENCOUNTER — Encounter: Payer: Self-pay | Admitting: Obstetrics & Gynecology

## 2015-02-12 ENCOUNTER — Other Ambulatory Visit (HOSPITAL_COMMUNITY)
Admission: RE | Admit: 2015-02-12 | Discharge: 2015-02-12 | Disposition: A | Discharge status: Home / Self Care | Payer: Medicaid Other | Source: Ambulatory Visit | Attending: Obstetrics & Gynecology | Admitting: Obstetrics & Gynecology

## 2015-02-12 VITALS — BP 119/67 | HR 79 | Temp 98.6°F | Ht 62.0 in | Wt 278.0 lb

## 2015-02-12 DIAGNOSIS — N760 Acute vaginitis: Secondary | ICD-10-CM | POA: Diagnosis present

## 2015-02-12 DIAGNOSIS — Z6841 Body Mass Index (BMI) 40.0 and over, adult: Secondary | ICD-10-CM

## 2015-02-12 DIAGNOSIS — R87810 Cervical high risk human papillomavirus (HPV) DNA test positive: Secondary | ICD-10-CM

## 2015-02-12 DIAGNOSIS — Z113 Encounter for screening for infections with a predominantly sexual mode of transmission: Secondary | ICD-10-CM | POA: Insufficient documentation

## 2015-02-12 DIAGNOSIS — R32 Unspecified urinary incontinence: Secondary | ICD-10-CM | POA: Diagnosis not present

## 2015-02-12 DIAGNOSIS — N812 Incomplete uterovaginal prolapse: Secondary | ICD-10-CM | POA: Insufficient documentation

## 2015-02-12 LAB — POCT URINALYSIS DIP (DEVICE)
BILIRUBIN URINE: NEGATIVE
GLUCOSE, UA: NEGATIVE mg/dL
Hgb urine dipstick: NEGATIVE
KETONES UR: NEGATIVE mg/dL
LEUKOCYTES UA: NEGATIVE
Nitrite: NEGATIVE
Protein, ur: NEGATIVE mg/dL
Specific Gravity, Urine: 1.025 (ref 1.005–1.030)
Urobilinogen, UA: 1 mg/dL (ref 0.0–1.0)
pH: 7 (ref 5.0–8.0)

## 2015-02-12 NOTE — Progress Notes (Signed)
Scheduled appt with Dr. Maryland Pink @ [phone] on February 1st @ 1115 pending that the pt has her PCP to make referral to the office.  Pt notified and agreed to contact her PCP for the referral.

## 2015-02-12 NOTE — Patient Instructions (Signed)
Return to clinic for any scheduled appointments or for any gynecologic concerns as needed.  Pelvic Organ Prolapse Pelvic organ prolapse is the stretching, bulging, or dropping of pelvic organs into an abnormal position. It happens when the muscles and tissues that surround and support pelvic structures are stretched or weak. Pelvic organ prolapse can involve:  Vagina (vaginal prolapse).  Uterus (uterine prolapse).  Bladder (cystocele).  Rectum (rectocele).  Intestines (enterocele). When organs other than the vagina are involved, they often bulge into the vagina or protrude from the vagina, depending on how severe the prolapse is. CAUSES Causes of this condition include:  Pregnancy, labor, and childbirth.  Long-lasting (chronic) cough.  Chronic constipation.  Obesity.  Past pelvic surgery.  Aging. During and after menopause, a decreased production of the hormone estrogen can weaken pelvic ligaments and muscles.  Consistently lifting more than 50 lb (23 kg).  Buildup of fluid in the abdomen due to certain diseases and other conditions. SYMPTOMS Symptoms of this condition include:  Loss of bladder control when you cough, sneeze, strain, and exercise (stress incontinence). This may be worse immediately following childbirth, and it may gradually improve over time.  Feeling pressure in your pelvis or vagina. This pressure may increase when you cough or when you are having a bowel movement.  A bulge that protrudes from the opening of your vagina or against your vaginal wall. If your uterus protrudes through the opening of your vagina and rubs against your clothing, you may also experience soreness, ulcers, infection, pain, and bleeding.  Increased effort to have a bowel movement or urinate.  Pain in your low back.  Pain, discomfort, or disinterest in sexual intercourse.  Repeated bladder infections (urinary tract infections).  Difficulty inserting or inability to insert a  tampon or applicator. In some people, this condition does not cause any symptoms. DIAGNOSIS Your health care provider may perform an internal and external vaginal and rectal exam. During the exam, you may be asked to cough and strain while you are lying down, sitting, and standing up. Your health care provider will determine if other tests are required, such as bladder function tests. TREATMENT In most cases, this condition needs to be treated only if it produces symptoms. No treatment is guaranteed to correct the prolapse or relieve the symptoms completely. Treatment may include:  Lifestyle changes, such as:  Avoiding drinking beverages that contain caffeine.  Increasing your intake of high-fiber foods. This can help to decrease constipation and straining during bowel movements.  Emptying your bladder at scheduled times (bladder training therapy). This can help to reduce or avoid urinary incontinence.  Losing weight if you are overweight or obese.  Estrogen. Estrogen may help mild prolapse by increasing the strength and tone of pelvic floor muscles.  Kegel exercises. These may help mild cases of prolapse by strengthening and tightening the muscles of the pelvic floor.  Pessary insertion. A pessary is a soft, flexible device that is placed into your vagina by your health care provider to help support the vaginal walls and keep pelvic organs in place.  Surgery. This is often the only form of treatment for severe prolapse. Different types of surgeries are available. HOME CARE INSTRUCTIONS  Wear a sanitary pad or absorbent product if you have urinary incontinence.  Avoid heavy lifting and straining with exercise and work. Do not hold your breath when you perform mild to moderate lifting and exercise activities. Limit your activities as directed by your health care provider.  Take medicines only  as directed by your health care provider.  Perform Kegel exercises as directed by your health  care provider.  If you have a pessary, take care of it as directed by your health care provider. SEEK MEDICAL CARE IF:  Your symptoms interfere with your daily activities or sex life.  You need medicine to help with the discomfort.  You notice bleeding from the vagina that is not related to your period.  You have a fever.  You have pain or bleeding when you urinate.  You have bleeding when you have a bowel movement.  You lose urine when you have sex.  You have chronic constipation.  You have a pessary that falls out.  You have vaginal discharge that has a bad smell.  You have low abdominal pain or cramping that is unusual for you.   This information is not intended to replace advice given to you by your health care provider. Make sure you discuss any questions you have with your health care provider.   Document Released: 09/05/2013 Document Reviewed: 09/05/2013 Elsevier Interactive Patient Education [employer].

## 2015-02-13 ENCOUNTER — Encounter: Payer: Self-pay | Admitting: Obstetrics & Gynecology

## 2015-02-13 LAB — URINE CYTOLOGY ANCILLARY ONLY
Chlamydia: NEGATIVE
Neisseria Gonorrhea: NEGATIVE
TRICH (WINDOWPATH): NEGATIVE

## 2015-02-13 LAB — RPR

## 2015-02-13 LAB — HEPATITIS B SURFACE ANTIGEN: HEP B S AG: NEGATIVE

## 2015-02-13 LAB — HEPATITIS C ANTIBODY: HCV AB: NEGATIVE

## 2015-02-13 LAB — HIV ANTIBODY (ROUTINE TESTING W REFLEX): HIV: NONREACTIVE

## 2015-02-13 NOTE — Progress Notes (Signed)
CLINIC ENCOUNTER NOTE  History:  39 y.o. G0P0 here today for evaluation of worsening dyspareunia.  She was seen for this same complaint on 08/02/2014 and had an unremarkable pelvic exam with scant discharge noted and sent for wet prep.  Wet prep showed few clue cells and yeast; but given her dyspareunia Metronidazole and Diflucan were prescribed.  She was also advised to use water-based lubrication, try other positions.  She reported that her symptoms were helped a little bit until she underwent a cholecystectomy on 12/02/2014.  After this surgery, her symptoms got worse and and she is concerned something is wrong.  She also reports increase in dysmenorrhea and some increase in urinary frequency, occasional leaking, and feeling she cannot empty her bladder completely  She denies any abnormal vaginal discharge, pruritus, pelvic pain or other concerns.  She desires STI screen to make sure everything is okay.   Past Medical History  Diagnosis Date  . GERD (gastroesophageal reflux disease)   . Diabetes mellitus     intolerant to Metformin  . Herpes genital   . PCOS (polycystic ovarian syndrome)   . Morbid obesity with BMI of 50 to 60 07/23/2009  . Dry skin     hands   . Asthma     prn inhaler  . Arthritis     major joints  . Complication of anesthesia     states is hard to wake up post-op  . Family history of anesthesia complication     mother went into coma during c-section  . Sleep apnea, obstructive     uses CPAP nightly - had sleep study 08/22/2007  . Carpal tunnel syndrome of right wrist 09/2012  . Obesity hypoventilation syndrome ([place]) 10/30/2012  . OSA on CPAP      AHI was on  11-17-12 to 120/hr, titrated to 15 cm with 3 cm EPR/   . Seasonal allergies   . Obesity, morbid ([place]) 01/10/2014  . Rheumatoid arthritis [facility])     Past Surgical History  Procedure Laterality Date  . Hysteroscopy w/d&c  01-08-2002  . Carpal tunnel release Right 10/02/2012    Procedure: RIGHT CARPAL TUNNEL  RELEASE ENDOSCOPIC;  Surgeon: Jolyn Nap, MD;  Location: [facility];  Service: Orthopedics;  Laterality: Right;  . Cholecystectomy N/A 12/02/2014    Procedure: LAPAROSCOPIC CHOLECYSTECTOMY;  Surgeon: Armandina Gemma, MD;  Location: [facility];  Service: General;  Laterality: N/A;    The following portions of the patient's history were reviewed and updated as appropriate: allergies, current medications, past family history, past medical history, past social history, past surgical history and problem list.   Health Maintenance:  Normal pap and positive HRHPV on 04/16/14.  Normal mammogram on 02/18/14.   Review of Systems:  Pertinent items noted in HPI and remainder of comprehensive ROS otherwise negative.  Objective:  Physical Exam BP 119/67 mmHg  Pulse 79  Temp(Src) 98.6 F (37 C) (Oral)  Ht 5' 2"  (1.575 m)  Wt 278 lb (126.1 kg)  BMI 50.83 kg/m2  LMP 02/08/2015 CONSTITUTIONAL: Well-developed, well-nourished female in no acute distress.  HENT:  Normocephalic, atraumatic. External right and left ear normal. Oropharynx is clear and moist EYES: Conjunctivae and EOM are normal. Pupils are equal, round, and reactive to light. No scleral icterus.  NECK: Normal range of motion, supple, no masses SKIN: Skin is warm and dry. No rash noted. Not diaphoretic. No erythema. No pallor. [place]: Alert and oriented to person, place, and time. Normal reflexes, muscle tone  coordination. No cranial nerve deficit noted. PSYCHIATRIC: Normal mood and affect. Normal behavior. Normal judgment and thought content. CARDIOVASCULAR: Normal heart rate noted RESPIRATORY: Effort and breath sounds normal, no problems with respiration noted ABDOMEN: Soft, obese, no distention noted.  Well-healed laparoscopic sites. MUSCULOSKELETAL: Normal range of motion. No edema noted. PELVIC: On examination of external genitalia, the cervix was noted to be almost at the level of the introitus (second degree uterine  prolapse) which is able to be reduced but causes moderate amount of pain for patient with reduction of the prolapse.  There is a significant anterior compartment prolapse as well; cystocele noted.  Otherwise, there is normal appearing external genitalia; normal appearing vaginal mucosa and cervix.  No abnormal discharge noted.  Normal uterine size, no other palpable masses, no uterine or adnexal tenderness.  This is a significant change from my previous pelvic exam on 08/02/2014. After the exam findings were relayed to patient, she reported feeling as though "something had dropped on the inside" but she did not understand what it could be.  Labs and Imaging POCT urinalysis dip (device)     Status: None   Collection Time: 02/12/15  2:19 PM  Result Value Ref Range   Glucose, UA NEGATIVE NEGATIVE mg/dL   Bilirubin Urine NEGATIVE NEGATIVE   Ketones, ur NEGATIVE NEGATIVE mg/dL   Specific Gravity, Urine 1.025 1.005 - 1.030   Hgb urine dipstick NEGATIVE NEGATIVE   pH 7.0 5.0 - 8.0   Protein, ur NEGATIVE NEGATIVE mg/dL   Urobilinogen, UA 1.0 0.0 - 1.0 mg/dL   Nitrite NEGATIVE NEGATIVE   Leukocytes, UA NEGATIVE NEGATIVE    Assessment & Plan:  1. Second degree uterine prolapse 2. Urinary incontinence in female Patient has significant pelvic organ prolapse, this could be causing her dyspareunia especially given her reaction to reduction of the prolapse.  Patient is morbidly obese, nulliparous, no plans for future fertility.  Morbid obesity and increased intraabdominal pressure are her risk factors for the prolapse.  She is not a good candidate for a pessary given her level of sexual activity, and she wants surgery.  Patient has DM, OSA and obesity which do not make her a favorable surgical candidate, but she insists that she wants this done. Of note, she reports she also had an appointment with Urology for her GU symptoms. Recommended referral to Urogynecology for further evaluation, will follow up  recommendations. - Ambulatory referral to Urogynecology (Dr. Maryland Pink)  3. Morbid obesity with BMI of 50 to 60 Encouraged weight loss. Patient is already dieting and exercising, congratulated her on her efforts and encouraged her to continue  4. Routine screening for STI (sexually transmitted infection) - HIV antibody - Hepatitis B surface antigen - Hepatitis C antibody - RPR - Urine cytology ancillary only (GC/Chlam/Trich) Will follow up results and manage accordingly.  5. Normal pap smear but positive HRHPV on 04/16/14 Return after 04/17/15 for repeat pap smear. Routine preventative health maintenance measures emphasized. Please refer to After Visit Summary for other counseling recommendations.   Return for Pap smear on 04/18/15 or later.   Total face-to-face time with patient: 25 minutes. Over 50% of encounter was spent on counseling and coordination of care.   Verita Schneiders, MD, [place] Attending Obstetrician & Gynecologist, [place] for [facility]

## 2015-02-21 ENCOUNTER — Other Ambulatory Visit: Payer: Self-pay

## 2015-02-21 ENCOUNTER — Encounter: Payer: Self-pay | Admitting: Gastroenterology

## 2015-02-21 DIAGNOSIS — Z1231 Encounter for screening mammogram for malignant neoplasm of breast: Secondary | ICD-10-CM

## 2015-03-10 ENCOUNTER — Other Ambulatory Visit: Payer: Self-pay | Admitting: Allergy

## 2015-03-10 NOTE — Telephone Encounter (Signed)
OPENED IN ERROR

## 2015-03-20 ENCOUNTER — Ambulatory Visit: Payer: Medicaid Other

## 2015-03-24 ENCOUNTER — Other Ambulatory Visit: Payer: Self-pay

## 2015-03-24 ENCOUNTER — Ambulatory Visit
Admission: RE | Admit: 2015-03-24 | Discharge: 2015-03-24 | Disposition: A | Discharge status: Home / Self Care | Payer: Medicaid Other | Source: Ambulatory Visit

## 2015-03-24 DIAGNOSIS — Z1231 Encounter for screening mammogram for malignant neoplasm of breast: Secondary | ICD-10-CM

## 2015-03-26 ENCOUNTER — Other Ambulatory Visit: Payer: Self-pay | Admitting: Internal Medicine

## 2015-03-26 NOTE — Telephone Encounter (Signed)
Pt requesting needle for the insulin to be filled @ [facility] on [address].

## 2015-03-27 MED ORDER — INSULIN PEN NEEDLE 31G X 5 MM MISC: Status: DC

## 2015-04-09 LAB — HM DIABETES EYE EXAM

## 2015-04-13 ENCOUNTER — Other Ambulatory Visit: Payer: Self-pay | Admitting: Internal Medicine

## 2015-04-18 ENCOUNTER — Ambulatory Visit (INDEPENDENT_AMBULATORY_CARE_PROVIDER_SITE_OTHER): Payer: Medicaid Other | Admitting: Gastroenterology

## 2015-04-18 ENCOUNTER — Other Ambulatory Visit: Payer: Self-pay | Admitting: Gastroenterology

## 2015-04-18 ENCOUNTER — Encounter: Payer: Self-pay | Admitting: *Deleted

## 2015-04-18 ENCOUNTER — Encounter: Payer: Self-pay | Admitting: Gastroenterology

## 2015-04-18 VITALS — BP 104/74 | HR 93 | Ht 62.0 in | Wt 280.0 lb

## 2015-04-18 DIAGNOSIS — R142 Eructation: Secondary | ICD-10-CM

## 2015-04-18 DIAGNOSIS — Z794 Long term (current) use of insulin: Secondary | ICD-10-CM

## 2015-04-18 DIAGNOSIS — E1110 Type 2 diabetes mellitus with ketoacidosis without coma: Secondary | ICD-10-CM

## 2015-04-18 DIAGNOSIS — E1310 Other specified diabetes mellitus with ketoacidosis without coma: Secondary | ICD-10-CM

## 2015-04-18 DIAGNOSIS — K219 Gastro-esophageal reflux disease without esophagitis: Secondary | ICD-10-CM

## 2015-04-18 DIAGNOSIS — K5900 Constipation, unspecified: Secondary | ICD-10-CM

## 2015-04-18 MED ORDER — OMEPRAZOLE 20 MG PO CPDR: 20.0000 mg | DELAYED_RELEASE_CAPSULE | Freq: Every day | ORAL | Status: DC

## 2015-04-18 NOTE — Progress Notes (Addendum)
Gastroenterology and Hepatology Consult Note:  History: November Sypher [place]ard 04/18/2015  Referring physician: Duwaine Maxin, DO  Reason for consult/chief complaint: Constipation and Gastroesophageal Reflux   Subjective HPI:  Patient was referred to see Ana Long for a constellation of somewhat vague symptoms. It seems that she had years of intermittent pyrosis and constipation. Over, she feels that ever since her cholecystectomy for acute cholecystitis in October 2016, she has had more frequent dyspepsia-like symptoms characterized by bloating and belching regurgitation and worsened constipation. There've been no other medications that she thought were related. She has been off phentermine for at least a month she was [place] of [place] and noticed no change in symptoms. There is no rectal bleeding hemoglobin A1c was 7.9 near the time of her surgery, does not appear to have been checked since then. Lactulose has been helping a little with the constipation. GB 11/2014, then more dyspspia, GERD, constipation worse (4x/wk) no blood   ROS:  Review of Systems  Constitutional: Negative for appetite change and unexpected weight change.  HENT: Negative for mouth sores and voice change.   Eyes: Negative for pain and redness.  Respiratory: Negative for cough and shortness of breath.   Cardiovascular: Negative for chest pain and palpitations.  Genitourinary: Negative for dysuria and hematuria.  Musculoskeletal: Negative for myalgias and arthralgias.  Skin: Negative for pallor and rash.  Neurological: Negative for weakness and headaches.  Hematological: Negative for adenopathy.     Past Medical History: Past Medical History  Diagnosis Date   GERD (gastroesophageal reflux disease)    Diabetes mellitus     intolerant to Metformin   Herpes genital    PCOS (polycystic ovarian syndrome)    Morbid obesity with BMI of 50 to 60 07/23/2009   Dry skin     hands    Asthma     prn inhaler   Arthritis      major joints   Complication of anesthesia     states is hard to wake up post-op   Family history of anesthesia complication     mother went into coma during c-section   Sleep apnea, obstructive     uses CPAP nightly - had sleep study 08/22/2007   Carpal tunnel syndrome of right wrist 09/2012   Obesity hypoventilation syndrome ([place]) 10/30/2012   OSA on CPAP      AHI was on  11-17-12 to 120/hr, titrated to 15 cm with 3 cm EPR/    Seasonal allergies    Obesity, morbid ([place]) 01/10/2014   Rheumatoid arthritis ([place])      Past Surgical History: Past Surgical History  Procedure Laterality Date   Hysteroscopy w/d&c  01-08-2002   Carpal tunnel release Right 10/02/2012    Procedure: RIGHT CARPAL TUNNEL RELEASE ENDOSCOPIC;  Surgeon: Jolyn Nap, MD;  Location: [facility];  Service: Orthopedics;  Laterality: Right;   Cholecystectomy N/A 12/02/2014    Procedure: LAPAROSCOPIC CHOLECYSTECTOMY;  Surgeon: Armandina Gemma, MD;  Location: [facility];  Service: General;  Laterality: N/A;     Family History: Family History  Problem Relation Age of Onset   Other Mother    Diabetes Maternal Aunt    Heart attack Maternal Aunt    Alzheimer's disease Maternal Uncle    Cancer Maternal Uncle     prostate   Diabetes Paternal Aunt    Cancer Paternal Aunt     brain,mouth   Alzheimer's disease Paternal Aunt    Diabetes Paternal Uncle    Diabetes Maternal  Grandmother    Diabetes Maternal Grandfather    Leukemia Cousin    Other Other    Obesity Other    Other Other    No CRC  Social History: Social History   Social History   Marital Status: Divorced    Spouse Name: N/A   Number of Children: 2   Years of Education: 57   Occupational History   UNEMPLOYED    Social History Main Topics   Smoking status: Former Smoker -- 10 years    Quit date: 05/24/2010   Smokeless tobacco: Never Used   Alcohol Use: 0.0 oz/week    0 Standard drinks or equivalent per week     Comment: rarely    Drug Use: No   Sexual Activity: Not Asked     Comment: Lives w/a partner   Other Topics Concern   None   Social History Narrative   Single, but lives with a partner   Has 2 children and cares for an infant during the day          Allergies: Allergies  Allergen Reactions   Citrus Hives and Itching   Latex Swelling   Penicillins Hives and Itching   Soap Hives, Itching and Swelling    PUREX LAUNDRY DETERGENT   Tomato Hives and Itching   Fluticasone     Per patient, makes sneeze and gag   Hydrocodone Itching and Nausea Only   Other Itching    Chocolate, Bananas, Acidic foods   Shellfish Allergy    Doxycycline Other (See Comments)    AFFECTS JOINTS   Metformin And Related Rash    GI UPSET    Outpatient Meds: Current Outpatient Prescriptions  Medication Sig Dispense Refill   acetaminophen (TYLENOL) 325 MG tablet Take 1-2 tablets (325-650 mg total) by mouth every 6 (six) hours as needed for fever, headache, mild pain or moderate pain.     acyclovir (ZOVIRAX) 400 MG tablet TAKE ONE TABLET BY MOUTH TWICE DAILY 60 tablet 1   albuterol (PROAIR HFA) 108 (90 BASE) MCG/ACT inhaler Inhale 2 puffs into the lungs every 4 (four) hours as needed for wheezing or shortness of breath (cough).     albuterol (VENTOLIN HFA) 108 (90 BASE) MCG/ACT inhaler Inhale 1-2 puffs into the lungs every 4 (four) hours as needed for wheezing or shortness of breath. 1 Inhaler 5   atorvastatin (LIPITOR) 10 MG tablet TAKE 1 TABLET (10 MG TOTAL) BY MOUTH DAILY. 90 tablet 1   famotidine (PEPCID) 20 MG tablet Take 20 mg by mouth at bedtime.     glipiZIDE (GLUCOTROL) 5 MG tablet TAKE 1 TABLET (5 MG TOTAL) BY MOUTH DAILY BEFORE BREAKFAST. 90 tablet 1   Insulin Glargine (LANTUS SOLOSTAR) 100 UNIT/ML Solostar Pen Inject 20 Units into the skin every morning. 15 mL 3   lactulose (CEPHULAC) 10 g packet Take 10 g by mouth as needed.     loratadine (CLARITIN) 10 MG tablet Take 1 tablet (10 mg total) by mouth 2 (two) times  daily. 60 tablet 1   ondansetron (ZOFRAN-ODT) 4 MG disintegrating tablet Take 1 tablet (4 mg total) by mouth every 8 (eight) hours as needed for nausea or vomiting. 15 tablet 0   phentermine (ADIPEX-P) 37.5 MG tablet Take 37.5 mg by mouth 2 times daily at 12 noon and 4 pm. Takes half tablet twice a day     Probiotic Product (PROBIOTIC DAILY PO) Take 2 capsules by mouth every evening.      topiramate (TOPAMAX)  25 MG tablet Take 25 mg by mouth 2 (two) times daily.     triamcinolone (NASACORT AQ) 55 MCG/ACT AERO nasal inhaler Place 2 sprays into the nose daily. 1 Inhaler 12   TURMERIC PO Take 2 tablets by mouth at bedtime. Takes generic turmeric     No current facility-administered medications for this visit.      ___________________________________________________________________ Objective  Exam:  BP 104/74 mmHg   Pulse 93   Ht 5' 2"  (1.575 m)   Wt 280 lb (127.007 kg)   BMI 51.20 kg/m2   LMP 04/15/2015 General: this is a  morbidly obese African-American female, not acutely ill  Eyes: sclera anicteric, no redness ENT: oral mucosa moist without lesions, no cervical or supraclavicular lymphadenopathy, good dentition CV: RRR without murmur, S1/S2, no JVD, no peripheral edema Resp: clear to auscultation bilaterally, normal RR and effort noted GI: soft, no tenderness, with active bowel sounds. cannot assess hepatosplenomegaly due to abdominal girth.  Skin; warm and dry, no rash or jaundice noted Neuro: awake, alert and oriented x 3. Normal gross motor function and fluent speech  Labs:  GB  pathology- acute cholecystitis and gallstones  Radiologic: CT urogram October 2016 acute cholecystitis, no bowel obstruction.  Assessment: Encounter Diagnoses  Name Primary?   Gastroesophageal reflux disease, esophagitis presence not specified Yes   Belching    Constipation, unspecified constipation type    Uncontrolled type 2 diabetes mellitus with ketoacidosis without coma, with long-term current  use of insulin ([place])    Morbid obesity due to excess calories ([place])     This variety of nonspecific symptoms are difficult to understand. I thing it is a combination of suboptimally controlled diabetes, poor diet or perhaps side effect of medicines.  Plan:  She does not want to pursue EGD/colon right now b/c worried about sedation risks ( high risk due to morbid obesity and obstructive sleep apnea ) Change H2RA to PPI Get better glucose control - see primary care soon for this Continued use of lactulose, since she reports MiraLAX just made her gassy Written Dietary advice was given   Thank you for the courtesy of this consult.  Please call me with any questions or concerns.  Nelida Meuse III

## 2015-04-18 NOTE — Patient Instructions (Addendum)
Change the Pepcid to Omeprazole (generic Prilosec) 20 mg once daily.  Low gas diet pamphlet given to read and review.  Thank you for choosing [facility]  Dr Wilfrid Lund III

## 2015-04-19 ENCOUNTER — Other Ambulatory Visit: Payer: Self-pay | Admitting: Internal Medicine

## 2015-05-03 ENCOUNTER — Other Ambulatory Visit: Payer: Self-pay | Admitting: Internal Medicine

## 2015-05-06 ENCOUNTER — Other Ambulatory Visit: Payer: Self-pay | Admitting: Internal Medicine

## 2015-06-18 ENCOUNTER — Encounter: Payer: Self-pay | Admitting: Family Medicine

## 2015-06-18 ENCOUNTER — Encounter (INDEPENDENT_AMBULATORY_CARE_PROVIDER_SITE_OTHER): Payer: Self-pay

## 2015-06-18 ENCOUNTER — Ambulatory Visit (INDEPENDENT_AMBULATORY_CARE_PROVIDER_SITE_OTHER): Payer: Medicaid Other | Admitting: Family Medicine

## 2015-06-18 VITALS — BP 139/87 | HR 90 | Ht 62.0 in | Wt 284.4 lb

## 2015-06-18 DIAGNOSIS — M25571 Pain in right ankle and joints of right foot: Secondary | ICD-10-CM

## 2015-06-18 NOTE — Patient Instructions (Signed)
You have an extensor tendinitis of your ankle and foot. Ice the area 15 minutes at a time 3-4 times a day. Wear the boot for 1-2 weeks then as needed to rest this - wear when up and walking around. Pick the bodyhelix sleeve OR the arch binder - don't wear both at the same time. Aleve 2 tabs twice a day with food OR ibuprofen 66m three times a day with food for pain and inflammation. Follow up with me in 1 month for reevaluation.

## 2015-06-19 DIAGNOSIS — M25571 Pain in right ankle and joints of right foot: Secondary | ICD-10-CM | POA: Insufficient documentation

## 2015-06-19 HISTORY — DX: Pain in right ankle and joints of right foot: M25.571

## 2015-06-19 NOTE — Assessment & Plan Note (Signed)
2/2 extensor tendinitis.  Cam walker, icing, nsaids.  Arch binder or sleeve.  F/u in 1 month for reevaluation.

## 2015-06-19 NOTE — Progress Notes (Signed)
PCP: Duwaine Maxin, DO  Subjective:   HPI: Patient is a 40 y.o. female here for right ankle pain.  2/11: Patient reports she's had 2 days of worsening right ankle pain. Feels this medially. No known injury or trauma. Wearing an elastic compression sleeve. Recalls doing an hour of walking this past Tuesday that may have flared this up. More swelling than usual. Has been elevating and using heat.  4/26: Patient reports she's had worsening right ankle pain over past week and a half. Pain is now mostly on dorsal aspect of the right foot/ankle. Some swelling. Using tumeric. Pain level 4/10 but can get up to 10/10, sharp at times. No new injuries. Doing home exercises. No numbness, skin changes.  Past Medical History  Diagnosis Date  . GERD (gastroesophageal reflux disease)   . Diabetes mellitus     intolerant to Metformin  . Herpes genital   . PCOS (polycystic ovarian syndrome)   . Morbid obesity with BMI of 50 to 60 07/23/2009  . Dry skin     hands   . Asthma     prn inhaler  . Arthritis     major joints  . Complication of anesthesia     states is hard to wake up post-op  . Family history of anesthesia complication     mother went into coma during c-section  . Sleep apnea, obstructive     uses CPAP nightly - had sleep study 08/22/2007  . Carpal tunnel syndrome of right wrist 09/2012  . Obesity hypoventilation syndrome ([place]) 10/30/2012  . OSA on CPAP      AHI was on  11-17-12 to 120/hr, titrated to 15 cm with 3 cm EPR/   . Seasonal allergies   . Obesity, morbid ([place]) 01/10/2014  . Rheumatoid arthritis [facility])     Current Outpatient Prescriptions on File Prior to Visit  Medication Sig Dispense Refill  . ACCU-CHEK SMARTVIEW test strip TEST THREE TIMES DAILY 150 each 5  . acetaminophen (TYLENOL) 325 MG tablet Take 1-2 tablets (325-650 mg total) by mouth every 6 (six) hours as needed for fever, headache, mild pain or moderate pain.    Marland Kitchen acyclovir (ZOVIRAX) 400 MG tablet TAKE  ONE TABLET BY MOUTH TWICE DAILY 60 tablet 1  . albuterol (PROAIR HFA) 108 (90 BASE) MCG/ACT inhaler Inhale 2 puffs into the lungs every 4 (four) hours as needed for wheezing or shortness of breath (cough).    Marland Kitchen albuterol (VENTOLIN HFA) 108 (90 BASE) MCG/ACT inhaler Inhale 1-2 puffs into the lungs every 4 (four) hours as needed for wheezing or shortness of breath. 1 Inhaler 5  . atorvastatin (LIPITOR) 10 MG tablet TAKE 1 TABLET (10 MG TOTAL) BY MOUTH DAILY. 90 tablet 1  . famotidine (PEPCID) 20 MG tablet Take 20 mg by mouth at bedtime.    Marland Kitchen glipiZIDE (GLUCOTROL) 5 MG tablet TAKE 1 TABLET (5 MG TOTAL) BY MOUTH DAILY BEFORE BREAKFAST. 90 tablet 1  . Insulin Glargine (LANTUS SOLOSTAR) 100 UNIT/ML Solostar Pen Inject 20 Units into the skin every morning. 15 mL 3  . lactulose (CEPHULAC) 10 g packet Take 10 g by mouth as needed.    . loratadine (CLARITIN) 10 MG tablet Take 1 tablet (10 mg total) by mouth 2 (two) times daily. 60 tablet 1  . omeprazole (PRILOSEC) 20 MG capsule Take 1 capsule (20 mg total) by mouth daily before breakfast. 30 capsule 3  . ondansetron (ZOFRAN-ODT) 4 MG disintegrating tablet Take 1 tablet (4 mg total) by  mouth every 8 (eight) hours as needed for nausea or vomiting. 15 tablet 0  . phentermine (ADIPEX-P) 37.5 MG tablet Take 37.5 mg by mouth 2 times daily at 12 noon and 4 pm. Takes half tablet twice a day    . Probiotic Product (PROBIOTIC DAILY PO) Take 2 capsules by mouth every evening.     . topiramate (TOPAMAX) 25 MG tablet Take 25 mg by mouth 2 (two) times daily.    Marland Kitchen triamcinolone (NASACORT AQ) 55 MCG/ACT AERO nasal inhaler Place 2 sprays into the nose daily. 1 Inhaler 12  . TURMERIC PO Take 2 tablets by mouth at bedtime. Takes generic turmeric     No current facility-administered medications on file prior to visit.    Past Surgical History  Procedure Laterality Date  . Hysteroscopy w/d&c  01-08-2002  . Carpal tunnel release Right 10/02/2012    Procedure: RIGHT CARPAL  TUNNEL RELEASE ENDOSCOPIC;  Surgeon: Jolyn Nap, MD;  Location: [facility];  Service: Orthopedics;  Laterality: Right;  . Cholecystectomy N/A 12/02/2014    Procedure: LAPAROSCOPIC CHOLECYSTECTOMY;  Surgeon: Armandina Gemma, MD;  Location: [facility];  Service: General;  Laterality: N/A;    Allergies  Allergen Reactions  . Citrus Hives and Itching  . Latex Swelling  . Penicillins Hives and Itching  . Soap Hives, Itching and Swelling    PUREX LAUNDRY DETERGENT  . Tomato Hives and Itching  . Fluticasone     Per patient, makes sneeze and gag  . Hydrocodone Itching and Nausea Only  . Other Itching    Chocolate, Bananas, Acidic foods  . Shellfish Allergy   . Doxycycline Other (See Comments)    AFFECTS JOINTS  . Metformin And Related Rash    GI UPSET    Social History   Social History  . Marital Status: Divorced    Spouse Name: N/A  . Number of Children: 2  . Years of Education: 14   Occupational History  . UNEMPLOYED    Social History Main Topics  . Smoking status: Former Smoker -- 10 years    Quit date: 05/24/2010  . Smokeless tobacco: Never Used  . Alcohol Use: 0.0 oz/week    0 Standard drinks or equivalent per week     Comment: rarely  . Drug Use: No  . Sexual Activity: Not on file     Comment: Lives w/a partner   Other Topics Concern  . Not on file   Social History Narrative   Single, but lives with a partner   Has 2 children and cares for an infant during the day          Family History  Problem Relation Age of Onset  . Other Mother   . Diabetes Maternal Aunt   . Heart attack Maternal Aunt   . Alzheimer's disease Maternal Uncle   . Cancer Maternal Uncle     prostate  . Diabetes Paternal Aunt   . Cancer Paternal Aunt     brain,mouth  . Alzheimer's disease Paternal Aunt   . Diabetes Paternal Uncle   . Diabetes Maternal Grandmother   . Diabetes Maternal Grandfather   . Leukemia Cousin   . Other Other   . Obesity Other   . Other Other      BP 139/87 mmHg  Pulse 90  Ht 5' 2"  (1.575 m)  Wt 284 lb 6.4 oz (129.003 kg)  BMI 52.00 kg/m2  Review of Systems: See HPI above.    Objective:  Physical Exam:  Gen: NAD  Right ankle/foot: Pes planus. No other gross deformity, swelling, ecchymoses FROM with 5/5 strength all directions. TTP over extensor tendons proximal foot.  No other tenderness of foot or ankle. Negative ant drawer and talar tilt.   Negative syndesmotic compression. Thompsons test negative. NV intact distally.  MSK u/s right ankle: Target sign of extensor digitorum and tibialis anterior tendons.  No other abnormalities.    Assessment & Plan:  1. Right ankle pain - 2/2 extensor tendinitis.  Cam walker, icing, nsaids.  Arch binder or sleeve.  F/u in 1 month for reevaluation.

## 2015-06-28 ENCOUNTER — Other Ambulatory Visit: Payer: Self-pay | Admitting: Internal Medicine

## 2015-06-30 NOTE — Telephone Encounter (Signed)
Last appointment 01/22/2015.

## 2015-07-15 ENCOUNTER — Ambulatory Visit: Payer: Medicaid Other | Admitting: Neurology

## 2015-07-15 ENCOUNTER — Telehealth: Payer: Self-pay

## 2015-07-15 NOTE — Telephone Encounter (Signed)
Pt did not show for their appt with Dr. Brett Fairy today.

## 2015-07-16 ENCOUNTER — Encounter: Payer: Self-pay | Admitting: Neurology

## 2015-07-29 ENCOUNTER — Encounter: Payer: Self-pay | Admitting: Internal Medicine

## 2015-07-29 ENCOUNTER — Ambulatory Visit (INDEPENDENT_AMBULATORY_CARE_PROVIDER_SITE_OTHER): Payer: Medicaid Other | Admitting: Internal Medicine

## 2015-07-29 VITALS — BP 108/72 | HR 94 | Temp 98.7°F | Ht 62.0 in | Wt 291.0 lb

## 2015-07-29 DIAGNOSIS — R339 Retention of urine, unspecified: Secondary | ICD-10-CM

## 2015-07-29 DIAGNOSIS — M1711 Unilateral primary osteoarthritis, right knee: Secondary | ICD-10-CM

## 2015-07-29 DIAGNOSIS — J302 Other seasonal allergic rhinitis: Secondary | ICD-10-CM | POA: Diagnosis not present

## 2015-07-29 DIAGNOSIS — Z794 Long term (current) use of insulin: Secondary | ICD-10-CM

## 2015-07-29 DIAGNOSIS — E1165 Type 2 diabetes mellitus with hyperglycemia: Secondary | ICD-10-CM

## 2015-07-29 DIAGNOSIS — IMO0001 Reserved for inherently not codable concepts without codable children: Secondary | ICD-10-CM

## 2015-07-29 DIAGNOSIS — J45909 Unspecified asthma, uncomplicated: Secondary | ICD-10-CM | POA: Diagnosis not present

## 2015-07-29 DIAGNOSIS — K5900 Constipation, unspecified: Secondary | ICD-10-CM

## 2015-07-29 DIAGNOSIS — Z79899 Other long term (current) drug therapy: Secondary | ICD-10-CM

## 2015-07-29 DIAGNOSIS — G4733 Obstructive sleep apnea (adult) (pediatric): Secondary | ICD-10-CM

## 2015-07-29 DIAGNOSIS — N812 Incomplete uterovaginal prolapse: Secondary | ICD-10-CM

## 2015-07-29 DIAGNOSIS — Z9989 Dependence on other enabling machines and devices: Secondary | ICD-10-CM

## 2015-07-29 DIAGNOSIS — M170 Bilateral primary osteoarthritis of knee: Secondary | ICD-10-CM

## 2015-07-29 LAB — POCT GLYCOSYLATED HEMOGLOBIN (HGB A1C): Hemoglobin A1C: 8.7

## 2015-07-29 LAB — GLUCOSE, CAPILLARY: Glucose-Capillary: 213 mg/dL — ABNORMAL HIGH (ref 65–99)

## 2015-07-29 MED ORDER — LACTULOSE 10 G PO PACK: 10.0000 g | PACK | Freq: Every day | ORAL | Status: DC | PRN

## 2015-07-29 MED ORDER — DICLOFENAC SODIUM 1 % TD GEL: 2.0000 g | Freq: Four times a day (QID) | TRANSDERMAL | Status: DC | PRN

## 2015-07-29 MED ORDER — INSULIN GLARGINE 100 UNIT/ML SOLOSTAR PEN: 40.0000 [IU] | PEN_INJECTOR | Freq: Every morning | SUBCUTANEOUS | Status: DC

## 2015-07-29 MED ORDER — GLIPIZIDE 5 MG PO TABS: 5.0000 mg | ORAL_TABLET | Freq: Two times a day (BID) | ORAL | Status: DC

## 2015-07-29 NOTE — Patient Instructions (Signed)
1. I will refer you to the gynecologist that was recommended.  You can use Aleve or ibuprofen for menstrual cramping but do not use more than recommended on packaging.  You can also try Tylenol for knee pain but no more than 3014m in one day.  I will send in some Voltaren gel for your knee.  Also, keep in mind we can do steroid injections here in clinic or you can see Dr. HBarbaraann Barthelif you prefer.   2. Please take all medications as prescribed.  I increased your glipizide to 535mtwice per day.  Keep the insulin the same, 40 units qAM.  Please come back to see me with your meter in two weeks.   3. If you have worsening of your symptoms or new symptoms arise, please call the clinic [phone] or go to the ER immediately if symptoms are severe.

## 2015-07-29 NOTE — Progress Notes (Signed)
Subjective:    Patient ID: Ana Long, female    DOB: 10-Jun-1975, 40 y.o.   MRN: [id number]  HPI Comments: [name] is a 40 year old woman with PMH as below here for follow-up of diabetes.  Please see problem based charting for the status of this and her other chronic medical conditions.     Past Medical History  Diagnosis Date  . GERD (gastroesophageal reflux disease)   . Diabetes mellitus     intolerant to Metformin  . Herpes genital   . PCOS (polycystic ovarian syndrome)   . Morbid obesity with BMI of 50 to 60 07/23/2009  . Dry skin     hands   . Asthma     prn inhaler  . Arthritis     major joints  . Complication of anesthesia     states is hard to wake up post-op  . Family history of anesthesia complication     mother went into coma during c-section  . Sleep apnea, obstructive     uses CPAP nightly - had sleep study 08/22/2007  . Carpal tunnel syndrome of right wrist 09/2012  . Obesity hypoventilation syndrome ([place]) 10/30/2012  . OSA on CPAP      AHI was on  11-17-12 to 120/hr, titrated to 15 cm with 3 cm EPR/   . Seasonal allergies   . Obesity, morbid ([place]) 01/10/2014  . Rheumatoid arthritis [facility])    Current Outpatient Prescriptions on File Prior to Visit  Medication Sig Dispense Refill  . ACCU-CHEK SMARTVIEW test strip TEST THREE TIMES DAILY 150 each 5  . acetaminophen (TYLENOL) 325 MG tablet Take 1-2 tablets (325-650 mg total) by mouth every 6 (six) hours as needed for fever, headache, mild pain or moderate pain.    Marland Kitchen acyclovir (ZOVIRAX) 400 MG tablet TAKE ONE TABLET BY MOUTH TWICE DAILY 60 tablet 1  . albuterol (PROAIR HFA) 108 (90 BASE) MCG/ACT inhaler Inhale 2 puffs into the lungs every 4 (four) hours as needed for wheezing or shortness of breath (cough).    Marland Kitchen albuterol (VENTOLIN HFA) 108 (90 BASE) MCG/ACT inhaler Inhale 1-2 puffs into the lungs every 4 (four) hours as needed for wheezing or shortness of breath. 1 Inhaler 5  . atorvastatin  (LIPITOR) 10 MG tablet TAKE 1 TABLET (10 MG TOTAL) BY MOUTH DAILY. 90 tablet 1  . famotidine (PEPCID) 20 MG tablet Take 20 mg by mouth at bedtime.    Marland Kitchen glipiZIDE (GLUCOTROL) 5 MG tablet TAKE 1 TABLET (5 MG TOTAL) BY MOUTH DAILY BEFORE BREAKFAST. 90 tablet 1  . Insulin Glargine (LANTUS SOLOSTAR) 100 UNIT/ML Solostar Pen Inject 20 Units into the skin every morning. 15 mL 3  . lactulose (CEPHULAC) 10 g packet Take 10 g by mouth as needed.    . loratadine (CLARITIN) 10 MG tablet Take 1 tablet (10 mg total) by mouth 2 (two) times daily. 60 tablet 1  . NASONEX 50 MCG/ACT nasal spray USE 2 SPRAYS IN EACH NOSTRIL ONCE DAILY FOR NASAL CONGESTION OR DRAINAGE  5  . omeprazole (PRILOSEC) 20 MG capsule Take 1 capsule (20 mg total) by mouth daily before breakfast. 30 capsule 3  . ondansetron (ZOFRAN-ODT) 4 MG disintegrating tablet Take 1 tablet (4 mg total) by mouth every 8 (eight) hours as needed for nausea or vomiting. 15 tablet 0  . phentermine (ADIPEX-P) 37.5 MG tablet Take 37.5 mg by mouth 2 times daily at 12 noon and 4 pm. Takes half tablet twice a day    .  Probiotic Product (PROBIOTIC DAILY PO) Take 2 capsules by mouth every evening.     . topiramate (TOPAMAX) 25 MG tablet Take 25 mg by mouth 2 (two) times daily.    Marland Kitchen triamcinolone (NASACORT AQ) 55 MCG/ACT AERO nasal inhaler Place 2 sprays into the nose daily. 1 Inhaler 12  . TURMERIC PO Take 2 tablets by mouth at bedtime. Takes generic turmeric     No current facility-administered medications on file prior to visit.    Review of Systems  Constitutional: Negative for fever, chills and appetite change.  Respiratory: Positive for cough. Negative for shortness of breath and wheezing.   Gastrointestinal: Positive for nausea and constipation. Negative for vomiting, abdominal pain, diarrhea and blood in stool.  Genitourinary: Positive for difficulty urinating, menstrual problem and dyspareunia. Negative for dysuria.       Chronic GU problem for several  months in the setting of uterine prolapse       Filed Vitals:   07/29/15 1523  BP: 108/72  Pulse: 94  Temp: 98.7 F (37.1 C)  TempSrc: Oral  Height: 5' 2"  (1.575 m)  Weight: 291 lb (131.997 kg)  SpO2: 97%    Objective:   Physical Exam  Constitutional: She is oriented to person, place, and time. No distress.  HENT:  Head: Normocephalic and atraumatic.  Mouth/Throat: Oropharynx is clear and moist. No oropharyngeal exudate.  Eyes: Conjunctivae and EOM are normal. Pupils are equal, round, and reactive to light. No scleral icterus.  Neck: Neck supple.  Cardiovascular: Normal rate, regular rhythm and normal heart sounds.  Exam reveals no gallop and no friction rub.   No murmur heard. Pulmonary/Chest: Effort normal and breath sounds normal. No respiratory distress. She has no wheezes. She has no rales.  Abdominal: Soft. Bowel sounds are normal. She exhibits no distension. There is no tenderness. There is no rebound and no guarding.  Musculoskeletal: Normal range of motion. She exhibits no edema or tenderness.  Neurological: She is alert and oriented to person, place, and time. No cranial nerve deficit.  Skin: Skin is warm. She is not diaphoretic.  Psychiatric: She has a normal mood and affect. Her behavior is normal. Judgment and thought content normal.  Vitals reviewed.         Assessment & Plan:  Please see problem based charting for A&P.

## 2015-07-30 NOTE — Assessment & Plan Note (Signed)
Assessment:  Still having difficulties with urination.  Recent eval by Urology w/o evidence of urinary problem.  She was told she needs to follow-up with gyn for prolapse. Plan:  Referral to GYN

## 2015-07-30 NOTE — Assessment & Plan Note (Signed)
Assessment:  She says lactulose improved her symptoms but she has been out of this med.  She has BS and no obstipation or vomiting to suggest SBO.  I suspect this is due to her uterine prolapse. Plan:  rx sent for Lactulose daily prn for short term use.  Referral to Gyn that was recommended by her Urologist for further management (possibly surgery) for prolapse.

## 2015-07-30 NOTE — Assessment & Plan Note (Addendum)
Assessment: uterine prolapse likely causing urinary difficulties and constipation.  Patient has been evaluated by Concha Norway and recs for referral to Urogyn (Dr. Maryland Pink).  However, she has not yet been to Dr. Zigmund Daniel and Urology is recommending referral to another gyn, Dr. Garwin Brothers for this problem. Plan:  Referral placed to Gyn - Dr. Garwin Brothers for further management (she is interested in surgery); If Dr. Garwin Brothers is unable to provide service, will plan to refer to Dr. Zigmund Daniel.  Patient in agreement.

## 2015-07-30 NOTE — Assessment & Plan Note (Signed)
Assessment:  Stable Plan: continue prn albuterol

## 2015-07-30 NOTE — Assessment & Plan Note (Addendum)
Assessment:  Compliant with CPAP but she was unable to get her SD card back from [facility].  She says she has tried calling [facility] and [facility] for new SD card but no one has gotten back to her or sent new card. Plan:  Continue CPAP, she will call [facility] again to try and obtain SD

## 2015-07-30 NOTE — Assessment & Plan Note (Addendum)
Assessment:  Compliant with Claritin.  She still insists on taking BID and says the allergist told her this was ok.  Likely not helping her current urinary retention. Plan:  Continue Claritin BID
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
## 2015-07-30 NOTE — Assessment & Plan Note (Addendum)
Assessment:  Intermittent right knee pain due to OA. Plan:  Work on increased activity, weight loss, rx for Voltaren gel. RTC or Dr. Barbaraann Barthel for steroid injection if needed.

## 2015-07-31 ENCOUNTER — Emergency Department (HOSPITAL_COMMUNITY)
Admission: EM | Admit: 2015-07-31 | Discharge: 2015-07-31 | Disposition: A | Discharge status: Home / Self Care | Payer: No Typology Code available for payment source | Attending: Emergency Medicine | Admitting: Emergency Medicine

## 2015-07-31 ENCOUNTER — Emergency Department (HOSPITAL_COMMUNITY): Payer: No Typology Code available for payment source

## 2015-07-31 ENCOUNTER — Encounter (HOSPITAL_COMMUNITY): Payer: Self-pay | Admitting: *Deleted

## 2015-07-31 DIAGNOSIS — M25551 Pain in right hip: Secondary | ICD-10-CM | POA: Diagnosis not present

## 2015-07-31 DIAGNOSIS — Y999 Unspecified external cause status: Secondary | ICD-10-CM | POA: Diagnosis not present

## 2015-07-31 DIAGNOSIS — Y92410 Unspecified street and highway as the place of occurrence of the external cause: Secondary | ICD-10-CM | POA: Insufficient documentation

## 2015-07-31 DIAGNOSIS — Z794 Long term (current) use of insulin: Secondary | ICD-10-CM | POA: Insufficient documentation

## 2015-07-31 DIAGNOSIS — R072 Precordial pain: Secondary | ICD-10-CM | POA: Insufficient documentation

## 2015-07-31 DIAGNOSIS — Z91040 Latex allergy status: Secondary | ICD-10-CM | POA: Insufficient documentation

## 2015-07-31 DIAGNOSIS — Z79899 Other long term (current) drug therapy: Secondary | ICD-10-CM | POA: Insufficient documentation

## 2015-07-31 DIAGNOSIS — Z87891 Personal history of nicotine dependence: Secondary | ICD-10-CM | POA: Diagnosis not present

## 2015-07-31 DIAGNOSIS — J45909 Unspecified asthma, uncomplicated: Secondary | ICD-10-CM | POA: Insufficient documentation

## 2015-07-31 DIAGNOSIS — Z7984 Long term (current) use of oral hypoglycemic drugs: Secondary | ICD-10-CM | POA: Insufficient documentation

## 2015-07-31 DIAGNOSIS — R52 Pain, unspecified: Secondary | ICD-10-CM

## 2015-07-31 DIAGNOSIS — E119 Type 2 diabetes mellitus without complications: Secondary | ICD-10-CM | POA: Insufficient documentation

## 2015-07-31 DIAGNOSIS — R079 Chest pain, unspecified: Secondary | ICD-10-CM

## 2015-07-31 DIAGNOSIS — Y939 Activity, unspecified: Secondary | ICD-10-CM | POA: Diagnosis not present

## 2015-07-31 LAB — I-STAT BETA HCG BLOOD, ED (MC, WL, AP ONLY)

## 2015-07-31 MED ORDER — IPRATROPIUM-ALBUTEROL 0.5-2.5 (3) MG/3ML IN SOLN
RESPIRATORY_TRACT | Status: AC
  Filled 2015-07-31: qty 3

## 2015-07-31 MED ORDER — NAPROXEN 250 MG PO TABS
500.0000 mg | ORAL_TABLET | Freq: Once | ORAL | Status: AC
  Administered 2015-07-31: 500 mg via ORAL
  Filled 2015-07-31: qty 2

## 2015-07-31 MED ORDER — METHOCARBAMOL 500 MG PO TABS
1000.0000 mg | ORAL_TABLET | Freq: Once | ORAL | Status: AC
  Administered 2015-07-31: 1000 mg via ORAL
  Filled 2015-07-31: qty 2

## 2015-07-31 MED ORDER — IPRATROPIUM-ALBUTEROL 0.5-2.5 (3) MG/3ML IN SOLN
3.0000 mL | Freq: Once | RESPIRATORY_TRACT | Status: AC
  Administered 2015-07-31: 3 mL via RESPIRATORY_TRACT
  Filled 2015-07-31: qty 3

## 2015-07-31 MED ORDER — PREDNISONE 20 MG PO TABS: 60.0000 mg | ORAL_TABLET | Freq: Every day | ORAL | Status: DC

## 2015-07-31 MED ORDER — METHOCARBAMOL 750 MG PO TABS: 750.0000 mg | ORAL_TABLET | Freq: Four times a day (QID) | ORAL | Status: DC

## 2015-07-31 MED ORDER — PREDNISONE 20 MG PO TABS
60.0000 mg | ORAL_TABLET | Freq: Once | ORAL | Status: AC
  Administered 2015-07-31: 60 mg via ORAL
  Filled 2015-07-31: qty 3
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
## 2015-07-31 NOTE — Discharge Instructions (Signed)
Ms. Olivya Sobol Vanengen-Ward,  Nice meeting you! Please follow-up with your primary care provider. Return to the emergency department if you develop fevers, chills, loss of bladder/bowel control, abdominal pain, chest pain, shortness of breath. Feel better soon!  S. Wendie Simmer, PA-C
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
## 2015-07-31 NOTE — Progress Notes (Signed)
Internal Medicine Clinic Attending  Case discussed with Dr. Wilson soon after the resident saw the patient.  We reviewed the resident's history and exam and pertinent patient test results.  I agree with the assessment, diagnosis, and plan of care documented in the resident's note.  

## 2015-08-06 ENCOUNTER — Encounter: Payer: Self-pay | Admitting: *Deleted

## 2015-08-09 ENCOUNTER — Other Ambulatory Visit: Payer: Self-pay | Admitting: Internal Medicine

## 2015-09-19 ENCOUNTER — Other Ambulatory Visit: Payer: Self-pay | Admitting: *Deleted

## 2015-09-19 DIAGNOSIS — Z794 Long term (current) use of insulin: Principal | ICD-10-CM

## 2015-09-19 DIAGNOSIS — IMO0001 Reserved for inherently not codable concepts without codable children: Secondary | ICD-10-CM

## 2015-09-19 DIAGNOSIS — E1165 Type 2 diabetes mellitus with hyperglycemia: Principal | ICD-10-CM

## 2015-09-19 MED ORDER — INSULIN GLARGINE 100 UNIT/ML SOLOSTAR PEN
40.0000 [IU] | PEN_INJECTOR | Freq: Every morning | SUBCUTANEOUS | 0 refills | Status: DC
Start: 2015-09-19 — End: 2015-10-28

## 2015-10-08 ENCOUNTER — Other Ambulatory Visit: Payer: Self-pay

## 2015-10-08 NOTE — Telephone Encounter (Signed)
Incoming fax request for Omeprazole 20 mg once a day. Please advise on refills.

## 2015-10-09 LAB — HM DIABETES EYE EXAM

## 2015-10-09 MED ORDER — OMEPRAZOLE 20 MG PO CPDR: 20.0000 mg | DELAYED_RELEASE_CAPSULE | Freq: Every day | ORAL | 0 refills | Status: DC

## 2015-10-09 NOTE — Telephone Encounter (Signed)
Please have your primary doctor to continue all additional refills.

## 2015-10-09 NOTE — Telephone Encounter (Signed)
One month supply and one refill.  Can obtain from PCP thereafter.

## 2015-10-15 ENCOUNTER — Other Ambulatory Visit: Payer: Self-pay | Admitting: Internal Medicine

## 2015-10-15 DIAGNOSIS — Z794 Long term (current) use of insulin: Principal | ICD-10-CM

## 2015-10-15 DIAGNOSIS — E1165 Type 2 diabetes mellitus with hyperglycemia: Principal | ICD-10-CM

## 2015-10-15 DIAGNOSIS — IMO0001 Reserved for inherently not codable concepts without codable children: Secondary | ICD-10-CM

## 2015-10-23 ENCOUNTER — Encounter: Payer: Self-pay | Admitting: Internal Medicine

## 2015-10-23 ENCOUNTER — Telehealth: Payer: Self-pay | Admitting: *Deleted

## 2015-10-23 ENCOUNTER — Telehealth: Payer: Self-pay | Admitting: Dietician

## 2015-10-23 ENCOUNTER — Ambulatory Visit (INDEPENDENT_AMBULATORY_CARE_PROVIDER_SITE_OTHER): Payer: Medicaid Other | Admitting: Internal Medicine

## 2015-10-23 VITALS — BP 79/54 | HR 89 | Temp 98.1°F | Ht 62.0 in | Wt 300.9 lb

## 2015-10-23 DIAGNOSIS — J4531 Mild persistent asthma with (acute) exacerbation: Secondary | ICD-10-CM | POA: Diagnosis present

## 2015-10-23 DIAGNOSIS — Z87891 Personal history of nicotine dependence: Secondary | ICD-10-CM

## 2015-10-23 DIAGNOSIS — J45909 Unspecified asthma, uncomplicated: Secondary | ICD-10-CM

## 2015-10-23 MED ORDER — BECLOMETHASONE DIPROPIONATE 80 MCG/ACT IN AERS: 2.0000 | INHALATION_SPRAY | Freq: Two times a day (BID) | RESPIRATORY_TRACT | 11 refills | Status: DC

## 2015-10-23 MED ORDER — ALBUTEROL SULFATE (2.5 MG/3ML) 0.083% IN NEBU
2.5000 mg | INHALATION_SOLUTION | Freq: Once | RESPIRATORY_TRACT | Status: AC
  Administered 2015-10-23: 2.5 mg via RESPIRATORY_TRACT

## 2015-10-23 MED ORDER — PREDNISONE 10 MG PO TABS: ORAL_TABLET | ORAL | 0 refills | Status: DC

## 2015-10-23 MED ORDER — BENZONATATE 100 MG PO CAPS: 100.0000 mg | ORAL_CAPSULE | Freq: Four times a day (QID) | ORAL | 0 refills | Status: DC | PRN

## 2015-10-23 NOTE — Assessment & Plan Note (Addendum)
Patient is here because she feels thatr she is having exacerbation of her asthma. She had a sinus infection last week, and also had some seasonal allergies which spurred the dyspnea and exacerbation of asthma for last 3 days. She has been using her albuterol every 2-4 hours. Prior to the exacerbation, she was only using it few times a month. She is also apparently 'allergic' to fluticasone. She used to be on advair, but currently is only on PRN albuterol. She also has concurrent OSA and she uses CPAP. On exam, minimal air movt and no wheezing heard.  Patient was markedly dyspneic and was stopping in the middle of sentences feeling SOB. She was also a little hypotensive with 79/54 . She maintained normal O2 sats at 100% on room air. We were unable to do orthostatics due to her dizziness upon standing.  Assessment: exacerbation of Mild-moderate persistent asthma   Plan -given prednisone taker 40 mg x 2 days, 30 mg x 2 days, 20 mg x2 days, and 10 mg x 2 days -given her to start inhaled corticosteroid -continue PRN albuterol -we gave her 2 rounds of albuterol neb treatments, after which she felt better. -would recommend ordering PFTs at next visit - did not address it today due to her acute exac. -follow up with pcp to assess adherence to asthma regimen

## 2015-10-23 NOTE — Telephone Encounter (Signed)
Call from pt - states insurance will not cover Tessalon Perles. I call [facility] - the cost is $13.79. Call pt back - said she will be able to pay. Told her to call back for any other problems.

## 2015-10-23 NOTE — Telephone Encounter (Signed)
Call from pt - asthma flare-up after sinus problem; using inhaler which is not helping with breathing/SOB.  Appt given @1315  PM in [facility] but knows to go ED or UC if condition worsens.

## 2015-10-23 NOTE — Progress Notes (Signed)
   CC: asthma flare up HPI: [name] is a 40 y.o. woman with PMH noted below here for exacerbation of asthma   Please see Problem List/A&P for the status of the patient's chronic medical problems   Past Medical History:  Diagnosis Date  . Arthritis    major joints  . Asthma    prn inhaler  . Carpal tunnel syndrome of right wrist 09/2012  . Complication of anesthesia    states is hard to wake up post-op  . Diabetes mellitus    intolerant to Metformin  . Dry skin    hands   . Family history of anesthesia complication    mother went into coma during c-section  . GERD (gastroesophageal reflux disease)   . Herpes genital   . Morbid obesity with BMI of 50 to 60 07/23/2009  . Obesity hypoventilation syndrome ([place]) 10/30/2012  . Obesity, morbid ([place]) 01/10/2014  . OSA on CPAP     AHI was on  11-17-12 to 120/hr, titrated to 15 cm with 3 cm EPR/   . PCOS (polycystic ovarian syndrome)   . Rheumatoid arthritis ([place])   . Seasonal allergies   . Sleep apnea, obstructive    uses CPAP nightly - had sleep study 08/22/2007    Review of Systems: Denies fevers, chills, fatigue Has had sinus infx, runny nose, some ear pain, and sore throat last week Has some dyspnea, cough, and wheezing Denies vomiting or diarrhea. Feels dizzy  Physical Exam: Vitals:   10/23/15 1350  BP: (!) 79/54  Pulse: 89  Temp: 98.1 F (36.7 C)  TempSrc: Oral  SpO2: 100%  Weight: (!) 300 lb 14.4 oz (136.5 kg)  Height: 5' 2"  (1.575 m)    General: A&O, in NAD Neck: obese,  no cervical lymphadenopathy  HEENT: MMM, some erythema in oropharynx but no exudates CV: RRR, normal s1, s2, no m/r/g Resp: minimal air movt, can not appreciate significant wheezing Abdomen: soft, nontender, nondistended, +BS    Assessment & Plan:   See encounters tab for problem based medical decision making. Patient seen with Dr. Eppie Gibson

## 2015-10-23 NOTE — Telephone Encounter (Signed)
Steroid-Induced Hyperglycemia Prevention and Management Ana Long is a 40 y.o. female who meets criteria for [facility] quality improvement program (diabetes patient prescribed short course of steroids).  A/P Current Regimen  Patient prescribed prednisone  daily x 8 days, currently on day 0-1 of therapy. Prednisone indication asthma  Current DM regimen lantus 40 units qam and glipizide  DM regimen prior to steroid course same  Home BG Monitoring  Patient does have a meter at home and does check BG at home. Meter was not supplied.  CBGs at home unknown  CBGs prior to steroid course unknown, A1C prior to steroid course 8.7  Patient Education- by phone message today  Advised patient to monitor BG while on steroid therapy (at least twice daily prior to first 2 meals of the day).   PCP chose level 2 on this protocol so will be consulted prior to any changes.   Follow-up 10/24/2015 by phone   2 weeks  Brittie Whisnant, Butch Penny 4:45 PM 10/23/2015

## 2015-10-23 NOTE — Patient Instructions (Signed)
Thank you for your visit today  Please use the albuterol as needed  Please use the inhaler daily for your asthma- it will reduce the number of flare ups  Please take the prednisone as instructed for 8 days taper

## 2015-10-24 NOTE — Progress Notes (Signed)
I saw and evaluated the patient.  I personally confirmed the key portions of Dr. Sherlynn Carbon history and exam and reviewed pertinent patient test results.  The assessment, diagnosis, and plan were formulated together and I agree with the documentation in the resident's note.

## 2015-10-24 NOTE — Telephone Encounter (Signed)
Spoke with patient today: she started prednisone last night (took 4 pills) and last took 51 units lantus ( she states that her usual dose is 50-51 units lantus) last night also.  Last food was 630 am today.   No insulin today or glipizide, usually takes insulin at bedtime,   Blood sugars: 227 right now, yesterday- no blood sugars readings,  8/30 one reading of 184 (? Fasting)  8/29 one readings of 148  8/28 one reading of 164 7day average is 177,14 day 181  A/P: Advised patient to take glipizide as instructed/ordered. She stated she plans to limit her carbs and food intake to also help control blood sugar, These two action may control blood sugar adequately.   Patient provided with after hours number for resident on call. She agrees to check her blood sugar tonight and in the morning and to call if 2 are >200 mg/dl. Explained that protocol objective is to keep blood sugar < 200 mg/dl and if blood sugar rise >200, the resident on call or Dr. Gay Filler may ask her to temporarily increase her Lantus 5-10 units (10-20% per protocol) and that This increase will be decreased as her blood sugar and prednisone doses decrease. She verbalized understanding.   Follow up: will call her Monday and also have front office/patient schedule with doctor in 2 weeks.

## 2015-10-28 ENCOUNTER — Other Ambulatory Visit: Payer: Self-pay | Admitting: *Deleted

## 2015-10-28 ENCOUNTER — Telehealth: Payer: Self-pay | Admitting: Student-PharmD

## 2015-10-28 DIAGNOSIS — IMO0001 Reserved for inherently not codable concepts without codable children: Secondary | ICD-10-CM

## 2015-10-28 DIAGNOSIS — E1165 Type 2 diabetes mellitus with hyperglycemia: Principal | ICD-10-CM

## 2015-10-28 DIAGNOSIS — Z794 Long term (current) use of insulin: Principal | ICD-10-CM

## 2015-10-28 MED ORDER — INSULIN GLARGINE 100 UNIT/ML SOLOSTAR PEN: 40.0000 [IU] | PEN_INJECTOR | Freq: Every morning | SUBCUTANEOUS | 0 refills | Status: DC

## 2015-10-28 NOTE — Telephone Encounter (Signed)
Steroid-Induced Hyperglycemia Prevention and Management Ana Long is a 40 y.o. female who meets criteria for [facility] quality improvement program (diabetes patient prescribed short course of steroids).  A/P Current Regimen  Patient prescribed prednisone 10 mg daily x 8 days, currently on day 6 of therapy. Patient taking prednisone in the PM  Prednisone indication: Asthma flare-up  Current DM regimen Take 4 tabs po x 2 days, 3 tabs x 2 days, 2 tabs x 2 days, then 1 tab x 2 days  DM regimen prior to steroid course Lantus 40 units every morning  Pt reports trying to reduce carbs to control BG  Home BG Monitoring  Patient does have a meter at home and does check BG at home. Meter was not supplied.  CBGs at home: 296 this morning (10/27/15), 129 last night (10/27/15). On 10/26/15, BG 378 in AM and 136 in PM. On 10/25/15, BG 245 in AM and 220 in PM.  No s/sx of hypoglycemia  Medication Management  Continue prednisone q night, 5 mg glipizide BID before meals, and insulin 51 units  Additional treatment for BG control is not indicated at this time.  Glipizide 5 mg BID before meals    Patient Education  Advised patient to monitor BG while on steroid therapy (at least twice daily prior to first 2 meals of the day). Pt is currently taking BG in the morning upon rising and at bedtime.  Patient educated about signs/symptoms and advised to contact clinic if hyper- or hypoglycemic.  Patient did  verbalize understanding of information and regimen by repeating back topics discussed.   [name] 2:19 PM 10/28/2015

## 2015-10-28 NOTE — Telephone Encounter (Signed)
Refill request from pt's pharamacy-lantus last filled 09/19/2015.Despina Hidden Cassady9/5/201712:34 PM

## 2015-10-28 NOTE — Telephone Encounter (Signed)
DM + steroid patient

## 2015-10-31 NOTE — Telephone Encounter (Signed)
Called patient this am to let her know her appt time for next week  per Donna's request.  No answer on mobile number and unable to leave msg.  Tried home number next and she did answer. Appt confirmed for 11-06-15 @ 1:15 pm.

## 2015-11-05 NOTE — Telephone Encounter (Signed)
Hi Dr. Posey Pronto, not sure if I routed you the note, but switched her over to [place].  Thank you!  Patient was contacted with Ana Long, PharmD candidate. I agree with the assessment and plan of care documented.

## 2015-11-06 ENCOUNTER — Ambulatory Visit (INDEPENDENT_AMBULATORY_CARE_PROVIDER_SITE_OTHER): Payer: Medicaid Other | Admitting: Internal Medicine

## 2015-11-06 VITALS — BP 116/62 | HR 90 | Temp 98.7°F | Ht 62.0 in | Wt 294.6 lb

## 2015-11-06 DIAGNOSIS — J45909 Unspecified asthma, uncomplicated: Secondary | ICD-10-CM | POA: Diagnosis not present

## 2015-11-06 DIAGNOSIS — K5900 Constipation, unspecified: Secondary | ICD-10-CM | POA: Diagnosis not present

## 2015-11-06 DIAGNOSIS — Z87891 Personal history of nicotine dependence: Secondary | ICD-10-CM

## 2015-11-06 DIAGNOSIS — Z794 Long term (current) use of insulin: Secondary | ICD-10-CM | POA: Diagnosis not present

## 2015-11-06 DIAGNOSIS — E1165 Type 2 diabetes mellitus with hyperglycemia: Secondary | ICD-10-CM | POA: Diagnosis not present

## 2015-11-06 DIAGNOSIS — R339 Retention of urine, unspecified: Secondary | ICD-10-CM

## 2015-11-06 DIAGNOSIS — IMO0001 Reserved for inherently not codable concepts without codable children: Secondary | ICD-10-CM

## 2015-11-06 DIAGNOSIS — N920 Excessive and frequent menstruation with regular cycle: Secondary | ICD-10-CM | POA: Insufficient documentation

## 2015-11-06 DIAGNOSIS — N812 Incomplete uterovaginal prolapse: Secondary | ICD-10-CM | POA: Diagnosis not present

## 2015-11-06 DIAGNOSIS — N921 Excessive and frequent menstruation with irregular cycle: Secondary | ICD-10-CM

## 2015-11-06 DIAGNOSIS — J4531 Mild persistent asthma with (acute) exacerbation: Secondary | ICD-10-CM

## 2015-11-06 HISTORY — DX: Excessive and frequent menstruation with regular cycle: N92.0

## 2015-11-06 LAB — POCT GLYCOSYLATED HEMOGLOBIN (HGB A1C): Hemoglobin A1C: 10.8

## 2015-11-06 LAB — GLUCOSE, CAPILLARY: GLUCOSE-CAPILLARY: 268 mg/dL — AB (ref 65–99)

## 2015-11-06 MED ORDER — OMEPRAZOLE 20 MG PO CPDR: 20.0000 mg | DELAYED_RELEASE_CAPSULE | Freq: Every day | ORAL | 0 refills | Status: DC

## 2015-11-06 MED ORDER — EXENATIDE ER 2 MG ~~LOC~~ PEN: 2.0000 mg | PEN_INJECTOR | SUBCUTANEOUS | 2 refills | Status: DC

## 2015-11-06 NOTE — Assessment & Plan Note (Signed)
Pertinent Labs: Lab Results  Component Value Date   HGBA1C 10.8 11/06/2015   HGBA1C 7.9 (H) 12/04/2014   CREATININE 0.73 12/30/2014   CREATININE 0.60 07/24/2014   MICROALBUR 2.02 (H) 10/10/2013   MICRALBCREAT 10.2 10/10/2013   CHOL 148 07/24/2014   HDL 41 (L) 07/24/2014   TRIG 112 07/24/2014    Assessment: Patient reports that she is compliant with her Lantus and glipizide therapy. However she reports that she has not been taking her glipizide twice a day as previously recommended by Dr. Redmond Pulling. She reports that she only takes a second dose of her glipizide 1-2 times a week but is regularly taking at least 1 dose every day. She has also been titrating her Lantus according to her blood glucose readings. She reports currently taking 51 units of Lantus every night in light of her elevated blood sugars in the setting of redness and treatment for asthma exacerbation. Her blood glucose has been significant elevated over the last several weeks as evidence by her glucometer readings presented today. Her A1c is also elevated at 10.8 which has been slowly trending up over the last year. Suspect that part of her poor control in diabetes management is related to her frequent asthma exacerbations and steroid administration. However she has also reported that she has had poor control of diet and decreased activity lately. Recently she has started to lose weight and has been encouraged by this. Plan: Glucometer log was reviewed today, as pt did have glucometer available for review.   continue current medications  In addition to her glipizide and Lantus insulin we will add GLP1 agonist, exenatide extended release once weekly 5 mg. If this we helpful in reducing her weight and controlling her blood glucose. The ultimate goal will be to reduce her insulin requirement. We'll continue to follow her blood glucoses in one month in order to assess for tolerance of this new medication.

## 2015-11-06 NOTE — Assessment & Plan Note (Signed)
Patient continues to report constipation. This is neither worsened nor improved since her last visit. She continues to take lactulose as needed and has reported good success with this treatment. I feel that this is likely related to her uterine prolapse. We'll defer further management to await evaluation by urogynecology. Continue lactulose.

## 2015-11-06 NOTE — Assessment & Plan Note (Signed)
Patient reports that her symptoms of urinary retention improved. She reports that there was a problem with the referral to urogynecology for uterine prolapse that was admitted previously by Dr. Redmond Pulling. She denies any symptoms of dysuria, frequency, or changes to urine color or quality. Given that her symptoms have remained stable or not recommend any acute intervention at this time. She does report that she is able to void as needed over the last several weeks. We will continue follow-up on the referral to urogynecology for definitive management of her uterine prolapse which is likely the underlying cause of her symptoms.

## 2015-11-06 NOTE — Assessment & Plan Note (Signed)
Patient appears a documented second degree uterine prolapse which is likely contributing to her symptoms of urinary retention, constipation, and menorrhagia. However she's not been able to follow-up with the recommended uro-gynecologist for which referral was made by Dr. Redmond Pulling at her last visit. We will follow-up on this referral today. It will be important that she have this evaluated as it may be contributing to multiple complaints.

## 2015-11-06 NOTE — Patient Instructions (Signed)
It was great to see you in the clinic today and to meet you.  Your A1c is higher today and I would like to start a new medication called exenatide. This medication is an injection that you will take once every week and will help to lower your blood sugar levels in addition to help you lose weight. Please continue to eat well as you have been doing, avoiding excessive carbohydrates and staying active as much as possible throughout the day. Your weight loss over the last several weeks is very encouraging. Congratulations on this, keep up the good work!  I will also make a referral again to the urogynecologist in order to evaluate her uterine prolapse. Given your symptoms of abnormal menstrual bleeding, I think it's important to have the lining of her uterine evaluated with a biopsy. If urogynecologist is unable to do this during his evaluation, then we will make a referral to your regular gynecologist to have this procedure done. This will help [place] rule out any serious causes of abnormal bleeding such as cancer, and allow [place] to focus on treating the underlying cause.  In addition, we will schedule pulmonary function tests in order to assess your lung function. The nurse will call you with these appointment dates. Until then please continue to take your Qvar as you're able, in spite of the fact that the side effects are limiting her activity. After we have this data from the pulmonary function tests back we will make adjustments to medications.  I will call you with the results of your testing and we will plan to follow-up in 3 months to assess how your blood sugars are controlled.

## 2015-11-06 NOTE — Assessment & Plan Note (Signed)
Patient reports that over the last 6 month she has had increasingly irregular periods. She reports significantly increased bleeding during her periods and has noted that there also significantly cramping. Patient has no history of dysmenorrhea, and she denies any symptoms associated with PCOS. She does have significant obesity. Differential for this is broad including pre-menopause in a 40 year old woman. However,I recommend that she have a uterine biopsy in order to rule out malignancy. We will continue with the present referral to urogynecology and if they're able to conduct a biopsy at that time no further intervention is necessary. If they're unable to perform a biopsy we will make the necessary referral to her primary gynecologist for biopsy.

## 2015-11-06 NOTE — Progress Notes (Signed)
CC: Asthma, diabetes mellitus, uterine prolapse, dysmenorrhea HPI: [name] is a 40 y.o. female with a h/o of asthma, diabetes mellitus, obesity, who presents for follow-up of her asthma, diabetes, constipation, urinary retention, uterine prolapse, and other chronic medical conditions.  Please see Problem-based charting for HPI and the status of patient's chronic medical conditions.  Past Medical History:  Diagnosis Date  . Arthritis    major joints  . Asthma    prn inhaler  . Carpal tunnel syndrome of right wrist 09/2012  . Complication of anesthesia    states is hard to wake up post-op  . Diabetes mellitus    intolerant to Metformin  . Dry skin    hands   . Family history of anesthesia complication    mother went into coma during c-section  . GERD (gastroesophageal reflux disease)   . Herpes genital   . Morbid obesity with BMI of 50 to 60 07/23/2009  . Obesity hypoventilation syndrome ([place]) 10/30/2012  . Obesity, morbid ([place]) 01/10/2014  . OSA on CPAP     AHI was on  11-17-12 to 120/hr, titrated to 15 cm with 3 cm EPR/   . PCOS (polycystic ovarian syndrome)   . Rheumatoid arthritis ([place])   . Seasonal allergies   . Sleep apnea, obstructive    uses CPAP nightly - had sleep study 08/22/2007    Social Hx: Patient reports that she is currently trying to lose weight and has been eating a high protein low carbohydrate diet.  Review of Systems: ROS in HPI. Otherwise: Review of Systems  Constitutional: Negative for chills, fever and weight loss.  Respiratory: Negative for cough and shortness of breath.   Cardiovascular: Negative for chest pain and leg swelling.  Gastrointestinal: Positive for constipation. Negative for abdominal pain, diarrhea, nausea and vomiting.  Genitourinary: Negative for dysuria, frequency and urgency.       Abnormal vaginal bleeding    Physical Exam: Vitals:   11/06/15 1340  BP: 116/62  Pulse: 90  Temp: 98.7 F (37.1 C)    TempSrc: Oral  SpO2: 100%  Weight: 294 lb 9.6 oz (133.6 kg)  Height: 5' 2"  (1.575 m)   Physical Exam  Constitutional: She appears well-developed. She is cooperative. No distress.  Cardiovascular: Normal rate, regular rhythm, normal heart sounds and normal pulses.  Exam reveals no gallop.   No murmur heard. Pulmonary/Chest: Effort normal and breath sounds normal. No respiratory distress. She has no wheezes. She has no rhonchi. She has no rales. Breasts are symmetrical.  Abdominal: Soft. Bowel sounds are normal. There is no tenderness.  Musculoskeletal: She exhibits no edema.    Assessment & Plan:  See encounters tab for problem based medical decision making. Patient seen with Dr. Eppie Gibson  Urinary retention Patient reports that her symptoms of urinary retention improved. She reports that there was a problem with the referral to urogynecology for uterine prolapse that was admitted previously by Dr. Redmond Pulling. She denies any symptoms of dysuria, frequency, or changes to urine color or quality. Given that her symptoms have remained stable or not recommend any acute intervention at this time. She does report that she is able to void as needed over the last several weeks. We will continue follow-up on the referral to urogynecology for definitive management of her uterine prolapse which is likely the underlying cause of her symptoms.  Diabetes mellitus type 2, uncontrolled ([place]) Pertinent Labs: Lab Results  Component Value Date   HGBA1C 10.8 11/06/2015  HGBA1C 7.9 (H) 12/04/2014   CREATININE 0.73 12/30/2014   CREATININE 0.60 07/24/2014   MICROALBUR 2.02 (H) 10/10/2013   MICRALBCREAT 10.2 10/10/2013   CHOL 148 07/24/2014   HDL 41 (L) 07/24/2014   TRIG 112 07/24/2014    Assessment: Patient reports that she is compliant with her Lantus and glipizide therapy. However she reports that she has not been taking her glipizide twice a day as previously recommended by Dr. Redmond Pulling. She reports that she  only takes a second dose of her glipizide 1-2 times a week but is regularly taking at least 1 dose every day. She has also been titrating her Lantus according to her blood glucose readings. She reports currently taking 51 units of Lantus every night in light of her elevated blood sugars in the setting of redness and treatment for asthma exacerbation. Her blood glucose has been significant elevated over the last several weeks as evidence by her glucometer readings presented today. Her A1c is also elevated at 10.8 which has been slowly trending up over the last year. Suspect that part of her poor control in diabetes management is related to her frequent asthma exacerbations and steroid administration. However she has also reported that she has had poor control of diet and decreased activity lately. Recently she has started to lose weight and has been encouraged by this. Plan: Glucometer log was reviewed today, as pt did have glucometer available for review.   continue current medications  In addition to her glipizide and Lantus insulin we will add GLP1 agonist, exenatide extended release once weekly 5 mg. If this we helpful in reducing her weight and controlling her blood glucose. The ultimate goal will be to reduce her insulin requirement. We'll continue to follow her blood glucoses in one month in order to assess for tolerance of this new medication.   Second degree uterine prolapse Patient appears a documented second degree uterine prolapse which is likely contributing to her symptoms of urinary retention, constipation, and menorrhagia. However she's not been able to follow-up with the recommended uro-gynecologist for which referral was made by Dr. Redmond Pulling at her last visit. We will follow-up on this referral today. It will be important that she have this evaluated as it may be contributing to multiple complaints.  Constipation Patient continues to report constipation. This is neither worsened nor  improved since her last visit. She continues to take lactulose as needed and has reported good success with this treatment. I feel that this is likely related to her uterine prolapse. We'll defer further management to await evaluation by urogynecology. Continue lactulose.  Asthma Patient's asthma has been poorly controlled recently. Her most recent exacerbation was 2 weeks ago for which she was treated with short course of steroids tapered. She tolerated this well and has been breathing well over the last several days.  Her blood glucoses have not been tolerating frequent steroid administration. I'm concerned that her asthma is poorly controlled probably due to her noncompliance with Qvar medication. She is reporting that Qvar has intolerable side effects that caused her to experience significant fatigue and sleepiness. She is only taking Qvar occasionally due to the side effects. She does not have any documented PFTs.  I recommend that we reevaluate her lung function with full PFTs. Following the results of this test, we will reevaluate her chronic asthma therapy and will likely switch her ICS to another medication. PFTs been ordered today will call the patient with the results when available.  Menorrhagia Patient reports that  over the last 6 month she has had increasingly irregular periods. She reports significantly increased bleeding during her periods and has noted that there also significantly cramping. Patient has no history of dysmenorrhea, and she denies any symptoms associated with PCOS. She does have significant obesity. Differential for this is broad including pre-menopause in a 40 year old woman. However,I recommend that she have a uterine biopsy in order to rule out malignancy. We will continue with the present referral to urogynecology and if they're able to conduct a biopsy at that time no further intervention is necessary. If they're unable to perform a biopsy we will make the necessary  referral to her primary gynecologist for biopsy.   Signed: Holley Raring, MD 11/06/2015, 5:52 PM  Pager: [phone]

## 2015-11-06 NOTE — Assessment & Plan Note (Signed)
Patient's asthma has been poorly controlled recently. Her most recent exacerbation was 2 weeks ago for which she was treated with short course of steroids tapered. She tolerated this well and has been breathing well over the last several days.  Her blood glucoses have not been tolerating frequent steroid administration. I'm concerned that her asthma is poorly controlled probably due to her noncompliance with Qvar medication. She is reporting that Qvar has intolerable side effects that caused her to experience significant fatigue and sleepiness. She is only taking Qvar occasionally due to the side effects. She does not have any documented PFTs.  I recommend that we reevaluate her lung function with full PFTs. Following the results of this test, we will reevaluate her chronic asthma therapy and will likely switch her ICS to another medication. PFTs been ordered today will call the patient with the results when available.

## 2015-11-11 NOTE — Progress Notes (Signed)
I saw and evaluated the patient.  I personally confirmed the key portions of Dr. Jamse Arn history and exam and reviewed pertinent patient test results.  The assessment, diagnosis, and plan were formulated together and I agree with the documentation in the resident's note.

## 2015-11-20 ENCOUNTER — Other Ambulatory Visit: Payer: Self-pay | Admitting: Gastroenterology

## 2015-11-20 ENCOUNTER — Ambulatory Visit (HOSPITAL_COMMUNITY)
Admission: RE | Admit: 2015-11-20 | Discharge: 2015-11-20 | Disposition: A | Discharge status: Home / Self Care | Payer: Medicaid Other | Source: Ambulatory Visit | Attending: Internal Medicine | Admitting: Internal Medicine

## 2015-11-20 ENCOUNTER — Encounter (HOSPITAL_COMMUNITY): Payer: Medicaid Other

## 2015-11-20 DIAGNOSIS — J988 Other specified respiratory disorders: Secondary | ICD-10-CM | POA: Insufficient documentation

## 2015-11-20 DIAGNOSIS — J4531 Mild persistent asthma with (acute) exacerbation: Secondary | ICD-10-CM | POA: Diagnosis present

## 2015-11-20 LAB — PULMONARY FUNCTION TEST
DL/VA % PRED: 103 %
DL/VA: 4.75 ml/min/mmHg/L
DLCO UNC: 15.83 ml/min/mmHg
DLCO unc % pred: 71 %
FEF 25-75 POST: 2.05 L/s
FEF 25-75 Pre: 3.17 L/sec
FEF2575-%Change-Post: -35 %
FEF2575-%PRED-POST: 76 %
FEF2575-%PRED-PRE: 119 %
FEV1-%Change-Post: -5 %
FEV1-%Pred-Post: 84 %
FEV1-%Pred-Pre: 89 %
FEV1-Post: 2 L
FEV1-Pre: 2.13 L
FEV1FVC-%Change-Post: -4 %
FEV1FVC-%PRED-PRE: 108 %
FEV6-%Change-Post: -1 %
FEV6-%PRED-POST: 82 %
FEV6-%PRED-PRE: 83 %
FEV6-POST: 2.33 L
FEV6-Pre: 2.36 L
FEV6FVC-%PRED-POST: 102 %
FEV6FVC-%Pred-Pre: 102 %
FVC-%Change-Post: -1 %
FVC-%PRED-POST: 80 %
FVC-%Pred-Pre: 81 %
FVC-POST: 2.33 L
FVC-Pre: 2.36 L
POST FEV6/FVC RATIO: 100 %
Post FEV1/FVC ratio: 86 %
Pre FEV1/FVC ratio: 90 %
Pre FEV6/FVC Ratio: 100 %
RV % pred: 99 %
RV: 1.55 L
TLC % PRED: 81 %
TLC: 3.95 L

## 2015-11-20 MED ORDER — ALBUTEROL SULFATE (2.5 MG/3ML) 0.083% IN NEBU
2.5000 mg | INHALATION_SOLUTION | Freq: Once | RESPIRATORY_TRACT | Status: AC
  Administered 2015-11-20: 2.5 mg via RESPIRATORY_TRACT

## 2015-11-21 ENCOUNTER — Telehealth: Payer: Self-pay | Admitting: *Deleted

## 2015-11-21 ENCOUNTER — Other Ambulatory Visit: Payer: Self-pay | Admitting: Obstetrics & Gynecology

## 2015-11-21 DIAGNOSIS — N760 Acute vaginitis: Secondary | ICD-10-CM

## 2015-11-21 NOTE — Telephone Encounter (Addendum)
Call to [employer] [facility] for Prior Authorization for [employer].  Information was started and given to operator.  Decision to be made in 24 hours.  Office to call [place] [facility] on Monday for decision.  [place] [phone]. Sander Nephew, RN 11/21/2015 4:24 PM.  Call to [place] to check on Prior Authorization request. Denied patient will need to try Metformin.  Informed intake person that patient is allergic to Metformin.  Request to be sent to Pharmacist for review.  Answer within 24 hours.  Sander Nephew, RN 12/02/2015 3:53 PM  Call to [facility]. Tracks to check on status of PA for [employer].  Spoke with agent there who did a telephone request for patient.  Approved 12/02/2015 thru 11/27/2016.  Patient was called and informed of as well as [facility] in [place].  Sander Nephew, RN 12/03/2015 4:52 PM

## 2015-11-28 ENCOUNTER — Other Ambulatory Visit: Payer: Self-pay | Admitting: Internal Medicine

## 2015-11-29 ENCOUNTER — Other Ambulatory Visit: Payer: Self-pay | Admitting: Internal Medicine

## 2015-11-29 DIAGNOSIS — J302 Other seasonal allergic rhinitis: Secondary | ICD-10-CM

## 2015-12-01 ENCOUNTER — Other Ambulatory Visit: Payer: Self-pay | Admitting: *Deleted

## 2015-12-01 DIAGNOSIS — E1165 Type 2 diabetes mellitus with hyperglycemia: Principal | ICD-10-CM

## 2015-12-01 DIAGNOSIS — IMO0001 Reserved for inherently not codable concepts without codable children: Secondary | ICD-10-CM

## 2015-12-01 DIAGNOSIS — Z794 Long term (current) use of insulin: Principal | ICD-10-CM

## 2015-12-01 MED ORDER — INSULIN GLARGINE 100 UNIT/ML SOLOSTAR PEN: 40.0000 [IU] | PEN_INJECTOR | Freq: Every morning | SUBCUTANEOUS | 0 refills | Status: DC

## 2015-12-12 NOTE — Telephone Encounter (Signed)
Seen by gyn on 02/12/2015

## 2015-12-25 ENCOUNTER — Other Ambulatory Visit: Payer: Self-pay | Admitting: Internal Medicine

## 2015-12-25 ENCOUNTER — Encounter: Payer: Self-pay | Admitting: *Deleted

## 2015-12-30 ENCOUNTER — Other Ambulatory Visit: Payer: Self-pay | Admitting: *Deleted

## 2015-12-30 MED ORDER — OMEPRAZOLE 20 MG PO CPDR: 20.0000 mg | DELAYED_RELEASE_CAPSULE | Freq: Every day | ORAL | 3 refills | Status: DC

## 2015-12-31 ENCOUNTER — Encounter: Payer: Self-pay | Admitting: Obstetrics & Gynecology

## 2015-12-31 ENCOUNTER — Telehealth: Payer: Self-pay | Admitting: Internal Medicine

## 2015-12-31 ENCOUNTER — Ambulatory Visit (INDEPENDENT_AMBULATORY_CARE_PROVIDER_SITE_OTHER): Payer: Medicaid Other | Admitting: Obstetrics & Gynecology

## 2015-12-31 ENCOUNTER — Telehealth: Payer: Self-pay | Admitting: *Deleted

## 2015-12-31 ENCOUNTER — Other Ambulatory Visit (HOSPITAL_COMMUNITY)
Admission: RE | Admit: 2015-12-31 | Discharge: 2015-12-31 | Disposition: A | Discharge status: Home / Self Care | Payer: Medicaid Other | Source: Ambulatory Visit | Attending: Obstetrics & Gynecology | Admitting: Obstetrics & Gynecology

## 2015-12-31 VITALS — BP 112/58 | HR 89 | Ht 62.0 in | Wt 308.0 lb

## 2015-12-31 DIAGNOSIS — N812 Incomplete uterovaginal prolapse: Secondary | ICD-10-CM

## 2015-12-31 DIAGNOSIS — Z1151 Encounter for screening for human papillomavirus (HPV): Secondary | ICD-10-CM | POA: Insufficient documentation

## 2015-12-31 DIAGNOSIS — Z01419 Encounter for gynecological examination (general) (routine) without abnormal findings: Secondary | ICD-10-CM | POA: Insufficient documentation

## 2015-12-31 DIAGNOSIS — R32 Unspecified urinary incontinence: Secondary | ICD-10-CM | POA: Diagnosis not present

## 2015-12-31 DIAGNOSIS — Z113 Encounter for screening for infections with a predominantly sexual mode of transmission: Secondary | ICD-10-CM | POA: Insufficient documentation

## 2015-12-31 DIAGNOSIS — Z124 Encounter for screening for malignant neoplasm of cervix: Secondary | ICD-10-CM | POA: Diagnosis not present

## 2015-12-31 NOTE — Telephone Encounter (Signed)
Pt referred to Dr. Maryland Pink on 01/21/16 at 10 am. She will be seen in the [place] office. Pt is aware and had no further questions.

## 2015-12-31 NOTE — Progress Notes (Signed)
GYNECOLOGY ANNUAL PREVENTATIVE CARE ENCOUNTER NOTE  Subjective:   Ana Long is a 40 y.o. G0P0 female here for a routine annual gynecologic exam.  Current complaints: patient has known second degree uterine prolapse and urinary incontinence.  I referred her to Dr. Maryland Pink (Urogynecology) last years, but patient somehow ended up going to Urology and was then referred to Dr. Garwin [facility]). She has seen her once, and is still undergoing some studies.  She does not understand why she was not told to follow up with me, her primary OB/GYN.  Still desires Urogynecology referral. Also desires annual STI screen.  Denies abnormal vaginal bleeding, discharge, pelvic pain, problems with intercourse or other gynecologic concerns.    Gynecologic History No LMP recorded. Contraception: none Last Pap: 2014. Results were: normal Last mammogram: 03/24/2015. Results were: normal  Obstetric History OB History  Gravida Para Term Preterm AB Living  0            SAB TAB Ectopic Multiple Live Births                   Past Medical History:  Diagnosis Date  . Arthritis    major joints  . Asthma    prn inhaler  . Carpal tunnel syndrome of right wrist 09/2012  . Complication of anesthesia    states is hard to wake up post-op  . Diabetes mellitus    intolerant to Metformin  . Dry skin    hands   . Family history of anesthesia complication    mother went into coma during c-section  . GERD (gastroesophageal reflux disease)   . Herpes genital   . Morbid obesity with BMI of 50 to 60 07/23/2009  . Obesity hypoventilation syndrome ([place]) 10/30/2012  . Obesity, morbid ([place]) 01/10/2014  . OSA on CPAP     AHI was on  11-17-12 to 120/hr, titrated to 15 cm with 3 cm EPR/   . PCOS (polycystic ovarian syndrome)   . Rheumatoid arthritis ([place])   . Seasonal allergies   . Sleep apnea, obstructive    uses CPAP nightly - had sleep study 08/22/2007    Past Surgical History:    Procedure Laterality Date  . CARPAL TUNNEL RELEASE Right 10/02/2012   Procedure: RIGHT CARPAL TUNNEL RELEASE ENDOSCOPIC;  Surgeon: Jolyn Nap, MD;  Location: [facility];  Service: Orthopedics;  Laterality: Right;  . CHOLECYSTECTOMY N/A 12/02/2014   Procedure: LAPAROSCOPIC CHOLECYSTECTOMY;  Surgeon: Armandina Gemma, MD;  Location: [facility];  Service: General;  Laterality: N/A;  . HYSTEROSCOPY W/D&C  01-08-2002    Current Outpatient Prescriptions on File Prior to Visit  Medication Sig Dispense Refill  . ACCU-CHEK SMARTVIEW test strip TEST THREE TIMES DAILY 150 each 5  . acetaminophen (TYLENOL) 325 MG tablet Take 1-2 tablets (325-650 mg total) by mouth every 6 (six) hours as needed for fever, headache, mild pain or moderate pain.    Marland Kitchen acyclovir (ZOVIRAX) 400 MG tablet TAKE 1 TABLET BY MOUTH TWICE A DAY 60 tablet 1  . albuterol (PROAIR HFA) 108 (90 BASE) MCG/ACT inhaler Inhale 2 puffs into the lungs every 4 (four) hours as needed for wheezing or shortness of breath (cough).    Marland Kitchen atorvastatin (LIPITOR) 10 MG tablet TAKE 1 TABLET (10 MG TOTAL) BY MOUTH DAILY. 90 tablet 1  . benzonatate (TESSALON PERLES) 100 MG capsule Take 1 capsule (100 mg total) by mouth every 6 (six) hours as needed for cough. 30 capsule  0  . diclofenac sodium (VOLTAREN) 1 % GEL Apply 2 g topically 4 (four) times daily as needed. 100 g 0  . Exenatide ER 2 MG PEN Inject 2 mg into the skin every 7 (seven) days. 1 each 2  . glipiZIDE (GLUCOTROL) 5 MG tablet TAKE 1 TABLET BY MOUTH TWICE A DAY BEFORE A MEAL 60 tablet 1  . Insulin Glargine (LANTUS SOLOSTAR) 100 UNIT/ML Solostar Pen Inject 40 Units into the skin every morning. (Patient taking differently: Inject 62 Units into the skin every morning. ) 15 mL 0  . lactulose (CEPHULAC) 10 g packet Take 1 packet (10 g total) by mouth daily as needed. Reported on 07/29/2015 30 each 0  . loratadine (CLARITIN) 10 MG tablet TAKE 1 TABLET (10 MG TOTAL) BY MOUTH 2 (TWO) TIMES DAILY.  60 tablet 1  . NASONEX 50 MCG/ACT nasal spray Place 2 sprays into each nostril once daily  5  . omeprazole (PRILOSEC) 20 MG capsule Take 1 capsule (20 mg total) by mouth daily before breakfast. 90 capsule 3  . phentermine (ADIPEX-P) 37.5 MG tablet TK 1 T PO QD  0  . Probiotic Product (PROBIOTIC DAILY PO) Take 2 capsules by mouth every evening.     . TURMERIC PO Take 2 tablets by mouth at bedtime. Takes generic turmeric    . beclomethasone (QVAR) 80 MCG/ACT inhaler Inhale 2 puffs into the lungs 2 (two) times daily. (Patient not taking: Reported on 12/31/2015) 1 Inhaler 11   No current facility-administered medications on file prior to visit.     Allergies  Allergen Reactions  . Citrus Hives and Itching  . Latex Swelling  . Penicillins Hives and Itching  . Soap Hives, Itching and Swelling    PUREX LAUNDRY DETERGENT  . Tomato Hives and Itching  . Fluticasone     Per patient, makes sneeze and gag  . Hydrocodone Itching and Nausea Only  . Other Itching    Chocolate, Bananas, Acidic foods  . Shellfish Allergy   . Doxycycline Other (See Comments)    AFFECTS JOINTS  . Metformin And Related Rash    GI UPSET    Social History   Social History  . Marital status: Divorced    Spouse name: N/A  . Number of children: 2  . Years of education: 14   Occupational History  . UNEMPLOYED Unemployed   Social History Main Topics  . Smoking status: Former Smoker    Years: 10.00    Quit date: 05/24/2010  . Smokeless tobacco: Never Used  . Alcohol use 0.0 oz/week     Comment: rarely  . Drug use: No  . Sexual activity: Not on file     Comment: Lives w/a partner   Other Topics Concern  . Not on file   Social History Narrative   Single, but lives with a partner   Has 2 children and cares for an infant during the day          Family History  Problem Relation Age of Onset  . Other Mother   . Diabetes Maternal Aunt   . Heart attack Maternal Aunt   . Alzheimer's disease Maternal Uncle    . Cancer Maternal Uncle     prostate  . Diabetes Paternal Aunt   . Cancer Paternal Aunt     brain,mouth  . Alzheimer's disease Paternal Aunt   . Diabetes Paternal Uncle   . Diabetes Maternal Grandmother   . Diabetes Maternal Grandfather   . Leukemia  Cousin   . Other Other   . Obesity Other   . Other Other     The following portions of the patient's history were reviewed and updated as appropriate: allergies, current medications, past family history, past medical history, past social history, past surgical history and problem list.  Review of Systems Pertinent items noted in HPI and remainder of comprehensive ROS otherwise negative.   Objective:  BP (!) 112/58   Pulse 89   Ht 5' 2"  (1.575 m)   Wt (!) 308 lb (139.7 kg)   BMI 56.33 kg/m  CONSTITUTIONAL: Well-developed, well-nourished female in no acute distress.  HENT:  Normocephalic, atraumatic, External right and left ear normal. Oropharynx is clear and moist EYES: Conjunctivae and EOM are normal. Pupils are equal, round, and reactive to light. No scleral icterus.  NECK: Normal range of motion, supple, no masses.  Normal thyroid.  SKIN: Skin is warm and dry. No rash noted. Not diaphoretic. No erythema. No pallor. NEUROLOGIC: Alert and oriented to person, place, and time. Normal reflexes, muscle tone coordination. No cranial nerve deficit noted. PSYCHIATRIC: Normal mood and affect. Normal behavior. Normal judgment and thought content. CARDIOVASCULAR: Normal heart rate noted, regular rhythm RESPIRATORY: Clear to auscultation bilaterally. Effort and breath sounds normal, no problems with respiration noted. BREASTS: Symmetric in size. No masses, skin changes, nipple drainage, or lymphadenopathy. ABDOMEN: Soft, obese, normal bowel sounds, no distention appreciated.  No tenderness, rebound or guarding.  PELVIC: On examination of external genitalia, the cervix was noted to be almost at the level of the introitus (second degree uterine  prolapse) which is able to be reduced.  There is some anterior compartment prolapse as well; cystocele noted.  Otherwise, there is normal appearing external genitalia; normal appearing vaginal mucosa and cervix.  No abnormal discharge noted.   Pap smear obtained.  Unable to palpate uterus or adnexa secondary to habitus.  MUSCULOSKELETAL: Normal range of motion. No tenderness.  No cyanosis, clubbing, or edema.  2+ distal pulses.   Assessment and Plan:  1. Encounter for routine gynecological examination with Papanicolaou smear of cervix - Cytology - PAP  2. Screen for STD (sexually transmitted disease) - Cytology - PAP - HIV antibody (with reflex) - RPR - Hepatitis B Surface AntiGEN  3. Second degree uterine prolapse 4. Urinary incontinence in female Patient has pelvic organ prolapse; she is morbidly obese, nulliparous, no plans for future fertility.  Morbid obesity and increased intraabdominal pressure are her risk factors for the prolapse.  She is not a good candidate for a pessary given her level of sexual activity, and she wants surgery.  Patient has DM, OSA and obesity which do not make her a favorable surgical candidate, but she insists that she wants this done. Recommended referral to Urogynecology for further evaluation, will follow up recommendations. - Ambulatory referral to Urogynecology (Dr. Maryland Pink)   Verita Schneiders, MD, [place] Attending Obstetrician & Gynecologist, [place] for [facility]

## 2015-12-31 NOTE — Telephone Encounter (Signed)
APT. REMINDER CALL, NO ANSWER, NO VOICEMAIL °

## 2015-12-31 NOTE — Patient Instructions (Addendum)
Thank you for enrolling in [facility]. Please follow the instructions below to securely access your online medical record. MyChart allows you to send messages to your doctor, view your test results, manage appointments, and more.   How Do I Sign Up? 1. In your Internet browser, go to [employer] and enter [email or web address]. 2. Click on the Sign Up Now link in the Sign In box. You will see the New Member Sign Up page. 3. Enter your MyChart Access Code exactly as it appears below. You will not need to use this code after you've completed the sign-up process. If you do not sign up before the expiration date, you must request a new code.  MyChart Access Code: [email or web address] Expires: 02/29/2016 11:14 AM  4. Enter your Social Security Number ([id number]) and Date of Birth (mm/dd/yyyy) as indicated and click Submit. You will be taken to the next sign-up page. 5. Create a MyChart ID. This will be your MyChart login ID and cannot be changed, so think of one that is secure and easy to remember. 6. Create a MyChart password. You can change your password at any time. 7. Enter your Password Reset Question and Answer. This can be used at a later time if you forget your password.  8. Enter your e-mail address. You will receive e-mail notification when new information is available in [facility]. 9. Click Sign Up. You can now view your medical record.   Additional Information Remember, MyChart is NOT to be used for urgent needs. For medical emergencies, dial 911.  Preventive Care for Adults, Female A healthy lifestyle and preventive care can promote health and wellness. Preventive health guidelines for women include the following key practices.  A routine yearly physical is a good way to check with your health care provider about your health and preventive screening. It is a chance to share any concerns and updates on your health and to receive a thorough exam.  Visit your dentist for a routine  exam and preventive care every 6 months. Brush your teeth twice a day and floss once a day. Good oral hygiene prevents tooth decay and gum disease.  The frequency of eye exams is based on your age, health, family medical history, use of contact lenses, and other factors. Follow your health care provider's recommendations for frequency of eye exams.  Eat a healthy diet. Foods like vegetables, fruits, whole grains, low-fat dairy products, and lean protein foods contain the nutrients you need without too many calories. Decrease your intake of foods high in solid fats, added sugars, and salt. Eat the right amount of calories for you.Get information about a proper diet from your health care provider, if necessary.  Regular physical exercise is one of the most important things you can do for your health. Most adults should get at least 150 minutes of moderate-intensity exercise (any activity that increases your heart rate and causes you to sweat) each week. In addition, most adults need muscle-strengthening exercises on 2 or more days a week.  Maintain a healthy weight. The body mass index (BMI) is a screening tool to identify possible weight problems. It provides an estimate of body fat based on height and weight. Your health care provider can find your BMI and can help you achieve or maintain a healthy weight.For adults 20 years and older:  A BMI below 18.5 is considered underweight.  A BMI of 18.5 to 24.9 is normal.  A BMI of 25 to 29.9 is considered overweight.  A BMI of  30 and above is considered obese.  Maintain normal blood lipids and cholesterol levels by exercising and minimizing your intake of saturated fat. Eat a balanced diet with plenty of fruit and vegetables. Blood tests for lipids and cholesterol should begin at age 68 and be repeated every 5 years. If your lipid or cholesterol levels are high, you are over 50, or you are at high risk for heart disease, you may need your cholesterol  levels checked more frequently.Ongoing high lipid and cholesterol levels should be treated with medicines if diet and exercise are not working.  If you smoke, find out from your health care provider how to quit. If you do not use tobacco, do not start.  Lung cancer screening is recommended for adults aged 66-80 years who are at high risk for developing lung cancer because of a history of smoking. A yearly low-dose CT scan of the lungs is recommended for people who have at least a 30-pack-year history of smoking and are a current smoker or have quit within the past 15 years. A pack year of smoking is smoking an average of 1 pack of cigarettes a day for 1 year (for example: 1 pack a day for 30 years or 2 packs a day for 15 years). Yearly screening should continue until the smoker has stopped smoking for at least 15 years. Yearly screening should be stopped for people who develop a health problem that would prevent them from having lung cancer treatment.  If you are pregnant, do not drink alcohol. If you are breastfeeding, be very cautious about drinking alcohol. If you are not pregnant and choose to drink alcohol, do not have more than 1 drink per day. One drink is considered to be 12 ounces (355 mL) of beer, 5 ounces (148 mL) of wine, or 1.5 ounces (44 mL) of liquor.  Avoid use of street drugs. Do not share needles with anyone. Ask for help if you need support or instructions about stopping the use of drugs.  High blood pressure causes heart disease and increases the risk of stroke. Your blood pressure should be checked at least every 1 to 2 years. Ongoing high blood pressure should be treated with medicines if weight loss and exercise do not work.  If you are 86-71 years old, ask your health care provider if you should take aspirin to prevent strokes.  Diabetes screening is done by taking a blood sample to check your blood glucose level after you have not eaten for a certain period of time (fasting).  If you are not overweight and you do not have risk factors for diabetes, you should be screened once every 3 years starting at age 49. If you are overweight or obese and you are 39-40 years of age, you should be screened for diabetes every year as part of your cardiovascular risk assessment.  Breast cancer screening is essential preventive care for women. You should practice "breast self-awareness." This means understanding the normal appearance and feel of your breasts and may include breast self-examination. Any changes detected, no matter how small, should be reported to a health care provider. Women in their 3s and 30s should have a clinical breast exam (CBE) by a health care provider as part of a regular health exam every 1 to 3 years. After age 47, women should have a CBE every year. Starting at age 65, women should consider having a mammogram (breast X-ray test) every year. Women who have a family history of breast cancer should  talk to their health care provider about genetic screening. Women at a high risk of breast cancer should talk to their health care providers about having an MRI and a mammogram every year.  Breast cancer gene (BRCA)-related cancer risk assessment is recommended for women who have family members with BRCA-related cancers. BRCA-related cancers include breast, ovarian, tubal, and peritoneal cancers. Having family members with these cancers may be associated with an increased risk for harmful changes (mutations) in the breast cancer genes BRCA1 and BRCA2. Results of the assessment will determine the need for genetic counseling and BRCA1 and BRCA2 testing.  Your health care provider may recommend that you be screened regularly for cancer of the pelvic organs (ovaries, uterus, and vagina). This screening involves a pelvic examination, including checking for microscopic changes to the surface of your cervix (Pap test). You may be encouraged to have this screening done every 3 years,  beginning at age 50.  For women ages 62-65, health care providers may recommend pelvic exams and Pap testing every 3 years, or they may recommend the Pap and pelvic exam, combined with testing for human papilloma virus (HPV), every 5 years. Some types of HPV increase your risk of cervical cancer. Testing for HPV may also be done on women of any age with unclear Pap test results.  Other health care providers may not recommend any screening for nonpregnant women who are considered low risk for pelvic cancer and who do not have symptoms. Ask your health care provider if a screening pelvic exam is right for you.  If you have had past treatment for cervical cancer or a condition that could lead to cancer, you need Pap tests and screening for cancer for at least 20 years after your treatment. If Pap tests have been discontinued, your risk factors (such as having a new sexual partner) need to be reassessed to determine if screening should resume. Some women have medical problems that increase the chance of getting cervical cancer. In these cases, your health care provider may recommend more frequent screening and Pap tests.  Colorectal cancer can be detected and often prevented. Most routine colorectal cancer screening begins at the age of 12 years and continues through age 70 years. However, your health care provider may recommend screening at an earlier age if you have risk factors for colon cancer. On a yearly basis, your health care provider may provide home test kits to check for hidden blood in the stool. Use of a small camera at the end of a tube, to directly examine the colon (sigmoidoscopy or colonoscopy), can detect the earliest forms of colorectal cancer. Talk to your health care provider about this at age 45, when routine screening begins. Direct exam of the colon should be repeated every 5-10 years through age 6 years, unless early forms of precancerous polyps or small growths are found.  People  who are at an increased risk for hepatitis B should be screened for this virus. You are considered at high risk for hepatitis B if:  You were born in a country where hepatitis B occurs often. Talk with your health care provider about which countries are considered high risk.  Your parents were born in a high-risk country and you have not received a shot to protect against hepatitis B (hepatitis B vaccine).  You have HIV or AIDS.  You use needles to inject street drugs.  You live with, or have sex with, someone who has hepatitis B.  You get hemodialysis treatment.  You take certain medicines for conditions like cancer, organ transplantation, and autoimmune conditions.  Hepatitis C blood testing is recommended for all people born from 1945 through 1965 and any individual with known risks for hepatitis C.  Practice safe sex. Use condoms and avoid high-risk sexual practices to reduce the spread of sexually transmitted infections (STIs). STIs include gonorrhea, chlamydia, syphilis, trichomonas, herpes, HPV, and human immunodeficiency virus (HIV). Herpes, HIV, and HPV are viral illnesses that have no cure. They can result in disability, cancer, and death.  You should be screened for sexually transmitted illnesses (STIs) including gonorrhea and chlamydia if:  You are sexually active and are younger than 24 years.  You are older than 24 years and your health care provider tells you that you are at risk for this type of infection.  Your sexual activity has changed since you were last screened and you are at an increased risk for chlamydia or gonorrhea. Ask your health care provider if you are at risk.  If you are at risk of being infected with HIV, it is recommended that you take a prescription medicine daily to prevent HIV infection. This is called preexposure prophylaxis (PrEP). You are considered at risk if:  You are sexually active and do not regularly use condoms or know the HIV status of  your partner(s).  You take drugs by injection.  You are sexually active with a partner who has HIV.  Talk with your health care provider about whether you are at high risk of being infected with HIV. If you choose to begin PrEP, you should first be tested for HIV. You should then be tested every 3 months for as long as you are taking PrEP.  Osteoporosis is a disease in which the bones lose minerals and strength with aging. This can result in serious bone fractures or breaks. The risk of osteoporosis can be identified using a bone density scan. Women ages 65 years and over and women at risk for fractures or osteoporosis should discuss screening with their health care providers. Ask your health care provider whether you should take a calcium supplement or vitamin D to reduce the rate of osteoporosis.  Menopause can be associated with physical symptoms and risks. Hormone replacement therapy is available to decrease symptoms and risks. You should talk to your health care provider about whether hormone replacement therapy is right for you.  Use sunscreen. Apply sunscreen liberally and repeatedly throughout the day. You should seek shade when your shadow is shorter than you. Protect yourself by wearing long sleeves, pants, a wide-brimmed hat, and sunglasses year round, whenever you are outdoors.  Once a month, do a whole body skin exam, using a mirror to look at the skin on your back. Tell your health care provider of new moles, moles that have irregular borders, moles that are larger than a pencil eraser, or moles that have changed in shape or color.  Stay current with required vaccines (immunizations).  Influenza vaccine. All adults should be immunized every year.  Tetanus, diphtheria, and acellular pertussis (Td, Tdap) vaccine. Pregnant women should receive 1 dose of Tdap vaccine during each pregnancy. The dose should be obtained regardless of the length of time since the last dose. Immunization is  preferred during the 27th-36th week of gestation. An adult who has not previously received Tdap or who does not know her vaccine status should receive 1 dose of Tdap. This initial dose should be followed by tetanus and diphtheria toxoids (Td) booster doses every   10 years. Adults with an unknown or incomplete history of completing a 3-dose immunization series with Td-containing vaccines should begin or complete a primary immunization series including a Tdap dose. Adults should receive a Td booster every 10 years.  Varicella vaccine. An adult without evidence of immunity to varicella should receive 2 doses or a second dose if she has previously received 1 dose. Pregnant females who do not have evidence of immunity should receive the first dose after pregnancy. This first dose should be obtained before leaving the health care facility. The second dose should be obtained 4-8 weeks after the first dose.  Human papillomavirus (HPV) vaccine. Females aged 13-26 years who have not received the vaccine previously should obtain the 3-dose series. The vaccine is not recommended for use in pregnant females. However, pregnancy testing is not needed before receiving a dose. If a female is found to be pregnant after receiving a dose, no treatment is needed. In that case, the remaining doses should be delayed until after the pregnancy. Immunization is recommended for any person with an immunocompromised condition through the age of 26 years if she did not get any or all doses earlier. During the 3-dose series, the second dose should be obtained 4-8 weeks after the first dose. The third dose should be obtained 24 weeks after the first dose and 16 weeks after the second dose.  Zoster vaccine. One dose is recommended for adults aged 60 years or older unless certain conditions are present.  Measles, mumps, and rubella (MMR) vaccine. Adults born before 1957 generally are considered immune to measles and mumps. Adults born in 1957  or later should have 1 or more doses of MMR vaccine unless there is a contraindication to the vaccine or there is laboratory evidence of immunity to each of the three diseases. A routine second dose of MMR vaccine should be obtained at least 28 days after the first dose for students attending postsecondary schools, health care workers, or international travelers. People who received inactivated measles vaccine or an unknown type of measles vaccine during 1963-1967 should receive 2 doses of MMR vaccine. People who received inactivated mumps vaccine or an unknown type of mumps vaccine before 1979 and are at high risk for mumps infection should consider immunization with 2 doses of MMR vaccine. For females of childbearing age, rubella immunity should be determined. If there is no evidence of immunity, females who are not pregnant should be vaccinated. If there is no evidence of immunity, females who are pregnant should delay immunization until after pregnancy. Unvaccinated health care workers born before 1957 who lack laboratory evidence of measles, mumps, or rubella immunity or laboratory confirmation of disease should consider measles and mumps immunization with 2 doses of MMR vaccine or rubella immunization with 1 dose of MMR vaccine.  Pneumococcal 13-valent conjugate (PCV13) vaccine. When indicated, a person who is uncertain of his immunization history and has no record of immunization should receive the PCV13 vaccine. All adults 65 years of age and older should receive this vaccine. An adult aged 19 years or older who has certain medical conditions and has not been previously immunized should receive 1 dose of PCV13 vaccine. This PCV13 should be followed with a dose of pneumococcal polysaccharide (PPSV23) vaccine. Adults who are at high risk for pneumococcal disease should obtain the PPSV23 vaccine at least 8 weeks after the dose of PCV13 vaccine. Adults older than 42 years of age who have normal immune system  function should obtain the PPSV23 vaccine   dose at least 1 year after the dose of PCV13 vaccine.  Pneumococcal polysaccharide (PPSV23) vaccine. When PCV13 is also indicated, PCV13 should be obtained first. All adults aged 65 years and older should be immunized. An adult younger than age 65 years who has certain medical conditions should be immunized. Any person who resides in a nursing home or long-term care facility should be immunized. An adult smoker should be immunized. People with an immunocompromised condition and certain other conditions should receive both PCV13 and PPSV23 vaccines. People with human immunodeficiency virus (HIV) infection should be immunized as soon as possible after diagnosis. Immunization during chemotherapy or radiation therapy should be avoided. Routine use of PPSV23 vaccine is not recommended for American Indians, [place] Natives, or people younger than 65 years unless there are medical conditions that require PPSV23 vaccine. When indicated, people who have unknown immunization and have no record of immunization should receive PPSV23 vaccine. One-time revaccination 5 years after the first dose of PPSV23 is recommended for people aged 19-64 years who have chronic kidney failure, nephrotic syndrome, asplenia, or immunocompromised conditions. People who received 1-2 doses of PPSV23 before age 65 years should receive another dose of PPSV23 vaccine at age 65 years or later if at least 5 years have passed since the previous dose. Doses of PPSV23 are not needed for people immunized with PPSV23 at or after age 65 years.  Meningococcal vaccine. Adults with asplenia or persistent complement component deficiencies should receive 2 doses of quadrivalent meningococcal conjugate (MenACWY-D) vaccine. The doses should be obtained at least 2 months apart. Microbiologists working with certain meningococcal bacteria, military recruits, people at risk during an outbreak, and people who travel to or live  in countries with a high rate of meningitis should be immunized. A first-year college student up through age 21 years who is living in a residence hall should receive a dose if she did not receive a dose on or after her 16th birthday. Adults who have certain high-risk conditions should receive one or more doses of vaccine.  Hepatitis A vaccine. Adults who wish to be protected from this disease, have certain high-risk conditions, work with hepatitis A-infected animals, work in hepatitis A research labs, or travel to or work in countries with a high rate of hepatitis A should be immunized. Adults who were previously unvaccinated and who anticipate close contact with an international adoptee during the first 60 days after arrival in the [place] from a country with a high rate of hepatitis A should be immunized.  Hepatitis B vaccine. Adults who wish to be protected from this disease, have certain high-risk conditions, may be exposed to blood or other infectious body fluids, are household contacts or sex partners of hepatitis B positive people, are clients or workers in certain care facilities, or travel to or work in countries with a high rate of hepatitis B should be immunized.  Haemophilus influenzae type b (Hib) vaccine. A previously unvaccinated person with asplenia or sickle cell disease or having a scheduled splenectomy should receive 1 dose of Hib vaccine. Regardless of previous immunization, a recipient of a hematopoietic stem cell transplant should receive a 3-dose series 6-12 months after her successful transplant. Hib vaccine is not recommended for adults with HIV infection. Preventive Services / Frequency Ages 19 to 39 years  Blood pressure check.** / Every 3-5 years.  Lipid and cholesterol check.** / Every 5 years beginning at age 20.  Clinical breast exam.** / Every 3 years for women in their 20s   and 30s.  BRCA-related cancer risk assessment.** / For women who have family members with  a BRCA-related cancer (breast, ovarian, tubal, or peritoneal cancers).  Pap test.** / Every 2 years from ages 21 through 29. Every 3 years starting at age 30 through age 65 or 70 with a history of 3 consecutive normal Pap tests.  HPV screening.** / Every 3 years from ages 30 through ages 65 to 70 with a history of 3 consecutive normal Pap tests.  Hepatitis C blood test.** / For any individual with known risks for hepatitis C.  Skin self-exam. / Monthly.  Influenza vaccine. / Every year.  Tetanus, diphtheria, and acellular pertussis (Tdap, Td) vaccine.** / Consult your health care provider. Pregnant women should receive 1 dose of Tdap vaccine during each pregnancy. 1 dose of Td every 10 years.  Varicella vaccine.** / Consult your health care provider. Pregnant females who do not have evidence of immunity should receive the first dose after pregnancy.  HPV vaccine. / 3 doses over 6 months, if 26 and younger. The vaccine is not recommended for use in pregnant females. However, pregnancy testing is not needed before receiving a dose.  Measles, mumps, rubella (MMR) vaccine.** / You need at least 1 dose of MMR if you were born in 1957 or later. You may also need a 2nd dose. For females of childbearing age, rubella immunity should be determined. If there is no evidence of immunity, females who are not pregnant should be vaccinated. If there is no evidence of immunity, females who are pregnant should delay immunization until after pregnancy.  Pneumococcal 13-valent conjugate (PCV13) vaccine.** / Consult your health care provider.  Pneumococcal polysaccharide (PPSV23) vaccine.** / 1 to 2 doses if you smoke cigarettes or if you have certain conditions.  Meningococcal vaccine.** / 1 dose if you are age 19 to 21 years and a first-year college student living in a residence hall, or have one of several medical conditions, you need to get vaccinated against meningococcal disease. You may also need  additional booster doses.  Hepatitis A vaccine.** / Consult your health care provider.  Hepatitis B vaccine.** / Consult your health care provider.  Haemophilus influenzae type b (Hib) vaccine.** / Consult your health care provider. Ages 40 to 64 years  Blood pressure check.** / Every year.  Lipid and cholesterol check.** / Every 5 years beginning at age 20 years.  Lung cancer screening. / Every year if you are aged 55-80 years and have a 30-pack-year history of smoking and currently smoke or have quit within the past 15 years. Yearly screening is stopped once you have quit smoking for at least 15 years or develop a health problem that would prevent you from having lung cancer treatment.  Clinical breast exam.** / Every year after age 40 years.  BRCA-related cancer risk assessment.** / For women who have family members with a BRCA-related cancer (breast, ovarian, tubal, or peritoneal cancers).  Mammogram.** / Every year beginning at age 40 years and continuing for as long as you are in good health. Consult with your health care provider.  Pap test.** / Every 3 years starting at age 30 years through age 65 or 70 years with a history of 3 consecutive normal Pap tests.  HPV screening.** / Every 3 years from ages 30 years through ages 65 to 70 years with a history of 3 consecutive normal Pap tests.  Fecal occult blood test (FOBT) of stool. / Every year beginning at age 50 years and continuing   until age 75 years. You may not need to do this test if you get a colonoscopy every 10 years.  Flexible sigmoidoscopy or colonoscopy.** / Every 5 years for a flexible sigmoidoscopy or every 10 years for a colonoscopy beginning at age 50 years and continuing until age 75 years.  Hepatitis C blood test.** / For all people born from 1945 through 1965 and any individual with known risks for hepatitis C.  Skin self-exam. / Monthly.  Influenza vaccine. / Every year.  Tetanus, diphtheria, and acellular  pertussis (Tdap/Td) vaccine.** / Consult your health care provider. Pregnant women should receive 1 dose of Tdap vaccine during each pregnancy. 1 dose of Td every 10 years.  Varicella vaccine.** / Consult your health care provider. Pregnant females who do not have evidence of immunity should receive the first dose after pregnancy.  Zoster vaccine.** / 1 dose for adults aged 60 years or older.  Measles, mumps, rubella (MMR) vaccine.** / You need at least 1 dose of MMR if you were born in 1957 or later. You may also need a second dose. For females of childbearing age, rubella immunity should be determined. If there is no evidence of immunity, females who are not pregnant should be vaccinated. If there is no evidence of immunity, females who are pregnant should delay immunization until after pregnancy.  Pneumococcal 13-valent conjugate (PCV13) vaccine.** / Consult your health care provider.  Pneumococcal polysaccharide (PPSV23) vaccine.** / 1 to 2 doses if you smoke cigarettes or if you have certain conditions.  Meningococcal vaccine.** / Consult your health care provider.  Hepatitis A vaccine.** / Consult your health care provider.  Hepatitis B vaccine.** / Consult your health care provider.  Haemophilus influenzae type b (Hib) vaccine.** / Consult your health care provider. Ages 65 years and over  Blood pressure check.** / Every year.  Lipid and cholesterol check.** / Every 5 years beginning at age 20 years.  Lung cancer screening. / Every year if you are aged 55-80 years and have a 30-pack-year history of smoking and currently smoke or have quit within the past 15 years. Yearly screening is stopped once you have quit smoking for at least 15 years or develop a health problem that would prevent you from having lung cancer treatment.  Clinical breast exam.** / Every year after age 40 years.  BRCA-related cancer risk assessment.** / For women who have family members with a BRCA-related  cancer (breast, ovarian, tubal, or peritoneal cancers).  Mammogram.** / Every year beginning at age 40 years and continuing for as long as you are in good health. Consult with your health care provider.  Pap test.** / Every 3 years starting at age 30 years through age 65 or 70 years with 3 consecutive normal Pap tests. Testing can be stopped between 65 and 70 years with 3 consecutive normal Pap tests and no abnormal Pap or HPV tests in the past 10 years.  HPV screening.** / Every 3 years from ages 30 years through ages 65 or 70 years with a history of 3 consecutive normal Pap tests. Testing can be stopped between 65 and 70 years with 3 consecutive normal Pap tests and no abnormal Pap or HPV tests in the past 10 years.  Fecal occult blood test (FOBT) of stool. / Every year beginning at age 50 years and continuing until age 75 years. You may not need to do this test if you get a colonoscopy every 10 years.  Flexible sigmoidoscopy or colonoscopy.** / Every 5 years   for a flexible sigmoidoscopy or every 10 years for a colonoscopy beginning at age 54 years and continuing until age 32 years.  Hepatitis C blood test.** / For all people born from [birth year] through 1967 and any individual with known risks for hepatitis C.  Osteoporosis screening.** / A one-time screening for women ages 27 years and over and women at risk for fractures or osteoporosis.  Skin self-exam. / Monthly.  Influenza vaccine. / Every year.  Tetanus, diphtheria, and acellular pertussis (Tdap/Td) vaccine.** / 1 dose of Td every 10 years.  Varicella vaccine.** / Consult your health care provider.  Zoster vaccine.** / 1 dose for adults aged 16 years or older.  Pneumococcal 13-valent conjugate (PCV13) vaccine.** / Consult your health care provider.  Pneumococcal polysaccharide (PPSV23) vaccine.** / 1 dose for all adults aged 74 years and older.  Meningococcal vaccine.** / Consult your health care provider.  Hepatitis A vaccine.** /  Consult your health care provider.  Hepatitis B vaccine.** / Consult your health care provider.  Haemophilus influenzae type b (Hib) vaccine.** / Consult your health care provider. ** Family history and personal history of risk and conditions may change your health care provider's recommendations.   This information is not intended to replace advice given to you by your health care provider. Make sure you discuss any questions you have with your health care provider.   Document Released: 04/06/2001 Document Revised: 03/01/2014 Document Reviewed: 07/06/2010 Elsevier Interactive Patient Education [employer].

## 2016-01-01 ENCOUNTER — Ambulatory Visit: Payer: Medicaid Other

## 2016-01-01 ENCOUNTER — Encounter: Payer: Medicaid Other | Admitting: Internal Medicine

## 2016-01-01 ENCOUNTER — Telehealth: Payer: Self-pay | Admitting: Internal Medicine

## 2016-01-01 LAB — HIV ANTIBODY (ROUTINE TESTING W REFLEX): HIV 1&2 Ab, 4th Generation: NONREACTIVE

## 2016-01-01 LAB — HEPATITIS B SURFACE ANTIGEN: Hepatitis B Surface Ag: NEGATIVE

## 2016-01-01 LAB — RPR

## 2016-01-01 NOTE — Telephone Encounter (Signed)
APT. REMINDER CALL, NO ANSWER, NO VOICEMAIL °

## 2016-01-02 ENCOUNTER — Encounter: Payer: Self-pay | Admitting: Internal Medicine

## 2016-01-02 ENCOUNTER — Ambulatory Visit (INDEPENDENT_AMBULATORY_CARE_PROVIDER_SITE_OTHER): Payer: Medicaid Other | Admitting: Internal Medicine

## 2016-01-02 DIAGNOSIS — E1165 Type 2 diabetes mellitus with hyperglycemia: Secondary | ICD-10-CM | POA: Diagnosis not present

## 2016-01-02 DIAGNOSIS — IMO0001 Reserved for inherently not codable concepts without codable children: Secondary | ICD-10-CM

## 2016-01-02 DIAGNOSIS — Z794 Long term (current) use of insulin: Secondary | ICD-10-CM | POA: Diagnosis not present

## 2016-01-02 LAB — CYTOLOGY - PAP
CHLAMYDIA, DNA PROBE: NEGATIVE
Diagnosis: NEGATIVE
HPV: NOT DETECTED
NEISSERIA GONORRHEA: NEGATIVE
Trichomonas: NEGATIVE

## 2016-01-02 LAB — GLUCOSE, CAPILLARY: GLUCOSE-CAPILLARY: 170 mg/dL — AB (ref 65–99)

## 2016-01-02 MED ORDER — INSULIN GLARGINE 100 UNIT/ML SOLOSTAR PEN
62.0000 [IU] | PEN_INJECTOR | Freq: Every morning | SUBCUTANEOUS | 12 refills | Status: DC
Start: 2016-01-02 — End: 2016-05-24

## 2016-01-02 MED ORDER — GLIPIZIDE 10 MG PO TABS: 10.0000 mg | ORAL_TABLET | Freq: Two times a day (BID) | ORAL | 5 refills | Status: DC

## 2016-01-02 NOTE — Assessment & Plan Note (Signed)
Her diabetes remains uncontrolled and she is having difficulty with the Byetta injections. I'm not sure why her blood sugars seem to be doing worse since being on the Byetta and not decreasing with her uptitration of Lantus. Will have her stop Byetta. Will increase Glipizide to 10 mg BID. Continue Lantus 62 units QHS. Advised her to check blood sugars twice daily and decrease Lantus if she experiences hypoglycemia. Will have her follow up in 1 month to reassess her blood glucose control.

## 2016-01-02 NOTE — Patient Instructions (Signed)
General Instructions: - Stop Exenatide injections - Increase Glipizide to 10 mg twice daily - Continue Lantus 62 units at bedtime - Follow up in 1 month to reassess blood sugars  Please bring your medicines with you each time you come to clinic.  Medicines may include prescription medications, over-the-counter medications, herbal remedies, eye drops, vitamins, or other pills.   Progress Toward Treatment Goals:  Treatment Goal 09/18/2014  Hemoglobin A1C -  Prevent falls at goal    Self Care Goals & Plans:  Self Care Goal 11/06/2015  Manage my medications take my medicines as prescribed; bring my medications to every visit; refill my medications on time  Monitor my health keep track of my blood glucose; bring my glucose meter and log to each visit  Eat healthy foods drink diet soda or water instead of juice or soda; eat more vegetables; eat foods that are low in salt; eat baked foods instead of fried foods; eat fruit for snacks and desserts  Be physically active find an activity I enjoy  Prevent falls -  Meeting treatment goals maintain the current self-care plan    Home Blood Glucose Monitoring 02/06/2014  Check my blood sugar 3 times a day  When to check my blood sugar -     Care Management & Community Referrals:  Referral 02/06/2014  Referrals made for care management support diabetes educator  Referrals made to community resources -

## 2016-01-02 NOTE — Progress Notes (Signed)
   CC: Diabetes   HPI:  [name] is a 40 y.o. woman with PMHx as noted below who presents today for follow up of her diabetes.   She was started on Byetta at her last visit in September. She notes side effects including weight gain, water retention, muscle aches, and increased hunger. She reports she had difficulty with the injections at first and felt she was not injecting the medication correctly for the first 2 weeks. She states for the last 5 weeks she feels the medication has been injected correctly. She reports increases in her blood sugar, with majority in the 200-300 range. She feels her blood sugars were better controlled while on Lantus and Glipizide alone. She notes she had to self-increase her Lantus to 62 units from 40 units due to the increased blood sugars.   Past Medical History:  Diagnosis Date  . Arthritis    major joints  . Asthma    prn inhaler  . Carpal tunnel syndrome of right wrist 09/2012  . Complication of anesthesia    states is hard to wake up post-op  . Diabetes mellitus    intolerant to Metformin  . Dry skin    hands   . Family history of anesthesia complication    mother went into coma during c-section  . GERD (gastroesophageal reflux disease)   . Herpes genital   . Morbid obesity with BMI of 50 to 60 07/23/2009  . Obesity hypoventilation syndrome ([place]) 10/30/2012  . Obesity, morbid ([place]) 01/10/2014  . OSA on CPAP     AHI was on  11-17-12 to 120/hr, titrated to 15 cm with 3 cm EPR/   . PCOS (polycystic ovarian syndrome)   . Rheumatoid arthritis ([place])   . Seasonal allergies   . Sleep apnea, obstructive    uses CPAP nightly - had sleep study 08/22/2007    Review of Systems:  All negative except per HPI  Physical Exam:  Vitals:   01/02/16 1450  BP: 112/73  Pulse: 84  Temp: 98.5 F (36.9 C)  TempSrc: Oral  SpO2: 95%  Weight: (!) 304 lb 9.6 oz (138.2 kg)  Height: 5' 2"  (1.575 m)   General: obese woman sitting up in chair,  NAD HEENT: [place]/AT, EOMI, sclera anicteric, mucus membranes moist CV: RRR, no m/g/r Pulm: CTA bilaterally, breaths non-labored Ext: warm, 1+ bilateral peripheral edema Neuro: alert and oriented x 3  Assessment & Plan:   See Encounters Tab for problem based charting.  Patient discussed with Dr. Daryll Drown

## 2016-01-06 NOTE — Progress Notes (Signed)
Internal Medicine Clinic Attending  Case discussed with Dr. Rivet soon after the resident saw the patient.  We reviewed the resident's history and exam and pertinent patient test results.  I agree with the assessment, diagnosis, and plan of care documented in the resident's note.  

## 2016-01-09 ENCOUNTER — Telehealth: Payer: Self-pay | Admitting: Adult Health

## 2016-01-09 NOTE — Telephone Encounter (Signed)
LMVM for pt / caregiver that would contact her medical supply company (where she got her cpap from) and relay to them that you need a sim card.  If you have questions to call me back.

## 2016-01-09 NOTE — Telephone Encounter (Signed)
Janett Billow Patterson/Assistant [phone] called on behalf of patient, states patient never received SIM card for CPAP Janett Billow advised she is not listed on patient's DPR.

## 2016-01-21 DIAGNOSIS — N884 Hypertrophic elongation of cervix uteri: Secondary | ICD-10-CM | POA: Insufficient documentation

## 2016-02-02 ENCOUNTER — Other Ambulatory Visit: Payer: Self-pay | Admitting: Internal Medicine

## 2016-02-02 DIAGNOSIS — E1165 Type 2 diabetes mellitus with hyperglycemia: Principal | ICD-10-CM

## 2016-02-02 DIAGNOSIS — Z794 Long term (current) use of insulin: Principal | ICD-10-CM

## 2016-02-02 DIAGNOSIS — IMO0001 Reserved for inherently not codable concepts without codable children: Secondary | ICD-10-CM

## 2016-02-04 ENCOUNTER — Other Ambulatory Visit: Payer: Self-pay | Admitting: Internal Medicine

## 2016-02-04 ENCOUNTER — Other Ambulatory Visit: Payer: Self-pay | Admitting: Pediatrics

## 2016-02-04 DIAGNOSIS — J302 Other seasonal allergic rhinitis: Secondary | ICD-10-CM

## 2016-03-04 ENCOUNTER — Ambulatory Visit (INDEPENDENT_AMBULATORY_CARE_PROVIDER_SITE_OTHER): Payer: Medicaid Other | Admitting: Internal Medicine

## 2016-03-04 VITALS — BP 146/86 | HR 96 | Temp 98.2°F | Wt 306.0 lb

## 2016-03-04 DIAGNOSIS — IMO0001 Reserved for inherently not codable concepts without codable children: Secondary | ICD-10-CM

## 2016-03-04 DIAGNOSIS — Z87891 Personal history of nicotine dependence: Secondary | ICD-10-CM | POA: Diagnosis not present

## 2016-03-04 DIAGNOSIS — M545 Low back pain: Secondary | ICD-10-CM | POA: Diagnosis not present

## 2016-03-04 DIAGNOSIS — Z7951 Long term (current) use of inhaled steroids: Secondary | ICD-10-CM | POA: Diagnosis not present

## 2016-03-04 DIAGNOSIS — Z8051 Family history of malignant neoplasm of kidney: Secondary | ICD-10-CM

## 2016-03-04 DIAGNOSIS — Z794 Long term (current) use of insulin: Secondary | ICD-10-CM

## 2016-03-04 DIAGNOSIS — E1165 Type 2 diabetes mellitus with hyperglycemia: Secondary | ICD-10-CM | POA: Diagnosis present

## 2016-03-04 DIAGNOSIS — J45909 Unspecified asthma, uncomplicated: Secondary | ICD-10-CM

## 2016-03-04 DIAGNOSIS — J069 Acute upper respiratory infection, unspecified: Secondary | ICD-10-CM | POA: Insufficient documentation

## 2016-03-04 DIAGNOSIS — J45901 Unspecified asthma with (acute) exacerbation: Secondary | ICD-10-CM

## 2016-03-04 LAB — POCT URINALYSIS DIPSTICK
Bilirubin, UA: NEGATIVE
Blood, UA: NEGATIVE
Glucose, UA: 100
KETONES UA: NEGATIVE
LEUKOCYTES UA: NEGATIVE
Nitrite, UA: NEGATIVE
Protein, UA: NEGATIVE
SPEC GRAV UA: 1.02
UROBILINOGEN UA: 1
pH, UA: 5.5

## 2016-03-04 LAB — GLUCOSE, CAPILLARY: Glucose-Capillary: 257 mg/dL — ABNORMAL HIGH (ref 65–99)

## 2016-03-04 LAB — POCT GLYCOSYLATED HEMOGLOBIN (HGB A1C): Hemoglobin A1C: 9.3

## 2016-03-04 MED ORDER — METFORMIN HCL ER 500 MG PO TB24: 500.0000 mg | ORAL_TABLET | Freq: Every day | ORAL | 0 refills | Status: DC

## 2016-03-04 MED ORDER — METHOCARBAMOL 500 MG PO TABS: 500.0000 mg | ORAL_TABLET | Freq: Four times a day (QID) | ORAL | 0 refills | Status: DC

## 2016-03-04 MED ORDER — FLUTICASONE PROPIONATE HFA 110 MCG/ACT IN AERO: 2.0000 | INHALATION_SPRAY | Freq: Two times a day (BID) | RESPIRATORY_TRACT | 12 refills | Status: DC

## 2016-03-04 NOTE — Assessment & Plan Note (Signed)
Patient had PFTs done in September 2017. These PFTs were completed on inhaled corticosteroid but did not show significant pulmonary response or structural airway disease. Did show some mild diffusion impairment which was likely incontinence. She also likely has some mild component of restriction with obesity hypoventilation syndrome. We will continue with" procedure therapy but switched to Flovent from Qvar given her adverse reactions with complaints of"blacking out".

## 2016-03-04 NOTE — Progress Notes (Signed)
CC: left low back pain HPI: [name] is a 41 y.o. female with a h/o of t2DM, GERD, OSA, asthma who presents with acute complaint of back pain.  Please see Problem-based charting for HPI and the status of patient's chronic medical conditions.  Past Medical History:  Diagnosis Date  . Arthritis    major joints  . Asthma    prn inhaler  . Carpal tunnel syndrome of right wrist 09/2012  . Complication of anesthesia    states is hard to wake up post-op  . Diabetes mellitus    intolerant to Metformin  . Dry skin    hands   . Family history of anesthesia complication    mother went into coma during c-section  . GERD (gastroesophageal reflux disease)   . Herpes genital   . Morbid obesity with BMI of 50 to 60 07/23/2009  . Obesity hypoventilation syndrome ([place]) 10/30/2012  . Obesity, morbid ([place]) 01/10/2014  . OSA on CPAP     AHI was on  11-17-12 to 120/hr, titrated to 15 cm with 3 cm EPR/   . PCOS (polycystic ovarian syndrome)   . Rheumatoid arthritis ([place])   . Seasonal allergies   . Sleep apnea, obstructive    uses CPAP nightly - had sleep study 08/22/2007   Social Hx: Social History  Substance Use Topics  . Smoking status: Former Smoker    Years: 10.00    Quit date: 05/24/2010  . Smokeless tobacco: Never Used  . Alcohol use 0.0 oz/week     Comment: rarely   Review of Systems: ROS in HPI. Otherwise: Review of Systems  Constitutional: Negative for chills, fever and weight loss.  HENT: Positive for congestion. Negative for ear pain, sinus pain and sore throat.   Respiratory: Positive for cough and sputum production. Negative for shortness of breath.   Cardiovascular: Negative for chest pain and leg swelling.  Gastrointestinal: Negative for abdominal pain, constipation, diarrhea, nausea and vomiting.  Genitourinary: Positive for flank pain. Negative for dysuria, frequency, hematuria and urgency.  Musculoskeletal: Positive for back pain.  Skin: Negative for  rash.    Physical Exam: Vitals:   03/04/16 1554  BP: (!) 146/86  Pulse: 96  Temp: 98.2 F (36.8 C)  TempSrc: Oral  SpO2: 97%  Weight: (!) 306 lb (138.8 kg)   Physical Exam  Constitutional: She appears well-developed. She is cooperative. No distress.  Cardiovascular: Normal rate, regular rhythm, normal heart sounds and normal pulses.  Exam reveals no gallop.   No murmur heard. Pulmonary/Chest: Effort normal and breath sounds normal. No respiratory distress. She has no wheezes. She has no rhonchi. She has no rales. Breasts are symmetrical.  Abdominal: Soft. Bowel sounds are normal. There is no tenderness.  Musculoskeletal: She exhibits no edema or tenderness.  TTP over left flank w/ palpable tight muscular knot.    Assessment & Plan:  See encounters tab for problem based medical decision making. Patient discussed with Dr. Dareen Piano  Diabetes mellitus type 2, uncontrolled ([place]) Pt was recently seen on 01/02/16 for uncontrolled hyperglycemia. Her Byetta was stopped d/t intolerance w/ wt gain and worsening BG. Glipizide was increased to 34m BID and Lantus was continued at 62U qAM. She presents today w/ acute complaint of low back pain, but is agreeable to assess DM. POC HgA1c checked today 9.3 from 10.8 in September 2017..Marland KitchenPt denies polydipsia and polyuria.  Plan: Continue 62 units of Lantus every morning,presented 10 mg twice a day. Add extendedrelease metformin  500 mg 4 insulin sensitizing. Patient has had GI side effects from this in the past so we will start this as a trial.  Low back pain Pt presents w/ left low back pain.he is concerned for problems of the kidney and she has malignancy on both sides of her family. The pain started several days ago. It's sharp pain which tenderness to palpation as well as some movement. She denies any urinary symptoms. No blood in her urine. Patient has a significant not in the left flank subcutaneously consistent with muscle spasm. This palpable  muscle but is significantly tender to palpation. Urine dipstick was negative.  Plan: We'll trial Robaxin 500 mg as needed in addition to NSAIDs.  Acute URI Patient was of some rest for symptoms of congestion, postnasal drip, cough. She denies any fever. She denies any sinus pain or ear pressure. Have given precautions about viral upper respiratory infections also recommended over-the-counter medication such as Robitussin, Mucinex and DayQuil which have guaifenesin injection for him. Also recommendedbuffered isotonic nasal saline irrigation.  Asthma Patient had PFTs done in September 2017. These PFTs were completed on inhaled corticosteroid but did not show significant pulmonary response or structural airway disease. Did show some mild diffusion impairment which was likely incontinence. She also likely has some mild component of restriction with obesity hypoventilation syndrome. We will continue with" procedure therapy but switched to Flovent from Qvar given her adverse reactions with complaints of"blacking out".   Signed: Holley Raring, MD 03/04/2016, 4:44 PM  Pager: [phone]

## 2016-03-04 NOTE — Assessment & Plan Note (Addendum)
Pt was recently seen on 01/02/16 for uncontrolled hyperglycemia. Her Byetta was stopped d/t intolerance w/ wt gain and worsening BG. Glipizide was increased to 77m BID and Lantus was continued at 62U qAM. She presents today w/ acute complaint of low back pain, but is agreeable to assess DM. POC HgA1c checked today 9.3 from 10.8 in September 2017..Marland KitchenPt denies polydipsia and polyuria.  Plan: Continue 62 units of Lantus every morning,presented 10 mg twice a day. Add extendedrelease metformin 500 mg 4 insulin sensitizing. Patient has had GI side effects from this in the past so we will start this as a trial.

## 2016-03-04 NOTE — Patient Instructions (Addendum)
I think that your back pain is related to muscle spasm. I will prescribe your muscle relaxer which she can take at night. This medicine will probably make too sleepy so make sure that you're not operating machinery or driving a car after using it.  For your diabetes we will add an extended release form of metformin. Take 1 pill, 500 mg every dayin addition to your other medications.  For your asthma we will change you from your Qvar inhaler to an inhaler called Flovent. This is a similar medication will hopefully have fewer side effects. Take 2 puffs of this medicine twice daily.  You have a viral infection of your sinuses and upper respiratory tract. Unfortunately, we do not have any good antibiotics to treat this infection. However, it should resolve on its own in 10-14 days.  In the meantime, there are several good remedies that will treat your symptoms and hopefully help to make you more comfortable. Please look for one of the over-the-counter medications below that will help with your congestion, cough, and post-nasal drip. Additionally, it will be very important to use nasal irrigation and nasal steroid spray to clear you sinus congestion. I have provided a recipe and instructions below.     BUFFERED ISOTONIC SALINE NASAL IRRIGATION  The Benefits:  1. When you irrigate, the isotonic saline (salt water) acts as a solvent and washes the mucus crusts and other debris from your nose.  2. This decongests and improves the airflow into your nose. The sinus passages begin to open.  3. Studies have also shown that a salt water and an alkaline (baking soda) irrigation solution improves nasal membrane cell function (mucociliary flow of mucus debris).  The Recipe:  1. Choose a 1-quart glass jar that is thoroughly cleansed.  2. Fill with sterile or distilled water, or you can boil water from the tap.  3. Add 1 to 2 heaping teaspoons of "pickling/canning/sea" salt (NOT table salt as it contains  a large number of additives). This salt is available at the grocery store in the food canning section.  4. Add 1 teaspoon of [employer] Baking Soda (pure bicarbonate).  5. Mix ingredients together and store at room temperature. Discard after one week. If you find this solution too strong, you may decrease the amount of salt added to 1 to 1  teaspoons. With children it is often best to start with a milder solution and advance slowly. Irrigate with 240 ml (8 oz) twice daily.  The Instructions:  You should plan to irrigate your nose with buffered isotonic saline 2 times per day. Many people prefer to warm the solution slightly in the microwave - but be sure that the solution is NOT HOT. Stand over the sink (some do this in the shower) and squirt the solution into each side of your nose, keeping your mouth open. This allows you to spit the saltwater out of your mouth. It will not harm you if you swallow a little.  If you have been told to use a nasal steroid such as Flonase, Nasonex, or Nasacort, you should always use isortonic saline solution first, then use your nasal steroid product. The nasal steroid is much more effective when sprayed onto clean nasal membranes and the steroid medicine will reach deeper into the nose.  Most people experience a little burning sensation the first few times they use a isotonic saline solution, but this usually goes away within a few days.

## 2016-03-04 NOTE — Assessment & Plan Note (Signed)
Patient was of some rest for symptoms of congestion, postnasal drip, cough. She denies any fever. She denies any sinus pain or ear pressure. Have given precautions about viral upper respiratory infections also recommended over-the-counter medication such as Robitussin, Mucinex and DayQuil which have guaifenesin injection for him. Also recommendedbuffered isotonic nasal saline irrigation.

## 2016-03-04 NOTE — Assessment & Plan Note (Addendum)
Pt presents w/ left low back pain.he is concerned for problems of the kidney and she has malignancy on both sides of her family. The pain started several days ago. It's sharp pain which tenderness to palpation as well as some movement. She denies any urinary symptoms. No blood in her urine. Patient has a significant not in the left flank subcutaneously consistent with muscle spasm. This palpable muscle but is significantly tender to palpation. Urine dipstick was negative.  Plan: We'll trial Robaxin 500 mg as needed in addition to NSAIDs.

## 2016-03-05 NOTE — Progress Notes (Signed)
Internal Medicine Clinic Attending  Case discussed with Dr. Gay Filler at the time of the visit.  We reviewed the resident's history and exam and pertinent patient test results.  I agree with the assessment, diagnosis, and plan of care documented in the resident's note.

## 2016-03-11 ENCOUNTER — Encounter: Payer: Medicaid Other | Admitting: Internal Medicine

## 2016-03-16 ENCOUNTER — Telehealth: Payer: Self-pay | Admitting: Internal Medicine

## 2016-03-16 NOTE — Telephone Encounter (Signed)
APT. REMINDER CALL, LMTCB °

## 2016-03-17 ENCOUNTER — Ambulatory Visit: Payer: Medicaid Other

## 2016-03-18 ENCOUNTER — Telehealth: Payer: Self-pay | Admitting: Internal Medicine

## 2016-03-18 NOTE — Telephone Encounter (Signed)
APT. REMINDER CALL, LMTCB °

## 2016-03-19 ENCOUNTER — Encounter: Payer: Self-pay | Admitting: Internal Medicine

## 2016-03-19 ENCOUNTER — Ambulatory Visit (INDEPENDENT_AMBULATORY_CARE_PROVIDER_SITE_OTHER): Payer: Medicaid Other | Admitting: Internal Medicine

## 2016-03-19 VITALS — BP 130/61 | HR 88 | Temp 98.2°F | Ht 62.0 in | Wt 305.0 lb

## 2016-03-19 DIAGNOSIS — Z807 Family history of other malignant neoplasms of lymphoid, hematopoietic and related tissues: Secondary | ICD-10-CM

## 2016-03-19 DIAGNOSIS — Z833 Family history of diabetes mellitus: Secondary | ICD-10-CM

## 2016-03-19 DIAGNOSIS — IMO0001 Reserved for inherently not codable concepts without codable children: Secondary | ICD-10-CM

## 2016-03-19 DIAGNOSIS — Z794 Long term (current) use of insulin: Secondary | ICD-10-CM

## 2016-03-19 DIAGNOSIS — E1165 Type 2 diabetes mellitus with hyperglycemia: Secondary | ICD-10-CM | POA: Diagnosis not present

## 2016-03-19 DIAGNOSIS — Z8489 Family history of other specified conditions: Secondary | ICD-10-CM

## 2016-03-19 DIAGNOSIS — Z820 Family history of epilepsy and other diseases of the nervous system: Secondary | ICD-10-CM | POA: Diagnosis not present

## 2016-03-19 DIAGNOSIS — Z8249 Family history of ischemic heart disease and other diseases of the circulatory system: Secondary | ICD-10-CM | POA: Diagnosis not present

## 2016-03-19 DIAGNOSIS — Z8042 Family history of malignant neoplasm of prostate: Secondary | ICD-10-CM

## 2016-03-19 DIAGNOSIS — Z87891 Personal history of nicotine dependence: Secondary | ICD-10-CM | POA: Diagnosis not present

## 2016-03-19 MED ORDER — METFORMIN HCL ER (OSM) 1000 MG PO TB24: 1000.0000 mg | ORAL_TABLET | Freq: Every day | ORAL | 2 refills | Status: DC

## 2016-03-19 NOTE — Assessment & Plan Note (Addendum)
Patient was seen several days ago for uncontrolled hyperglycemia. At that time she was on glipizide 10 mg twice a day and 62 units of Lantus every morning. Extended release metformin 500 mg daily was added in order to facilitate insulin sensitivity. She presents today for follow-up of her blood glucose. Patient has tolerated the addition of metformin well.  She brings her blood glucose meter and examination of this data demonstrates: 3 readings in the morning with an average blood glucose 215 6 readings in the afternoon with an average blood glucose of 122 3 readings in the evening with an average blood glucose of 211  Assessment: Uncontrolled diabetes  Plan: Continue Lantus 62 units every morning, glipizide 10 mg twice a day. Increase metformin extended-release to 1000 mg daily.

## 2016-03-19 NOTE — Progress Notes (Addendum)
CC: DM f/u HPI: [name] is a 41 y.o. female with a h/o of asthma, t2DM, GERD, HLD, chronic back pain who presents for t2DM f/u.  Please see Problem-based charting for HPI and the status of patient's chronic medical conditions.  Past Medical History:  Diagnosis Date  . Arthritis    major joints  . Asthma    prn inhaler  . Carpal tunnel syndrome of right wrist 09/2012  . Complication of anesthesia    states is hard to wake up post-op  . Diabetes mellitus    intolerant to Metformin  . Dry skin    hands   . Family history of anesthesia complication    mother went into coma during c-section  . GERD (gastroesophageal reflux disease)   . Herpes genital   . Morbid obesity with BMI of 50 to 60 07/23/2009  . Obesity hypoventilation syndrome ([place]) 10/30/2012  . Obesity, morbid ([place]) 01/10/2014  . OSA on CPAP     AHI was on  11-17-12 to 120/hr, titrated to 15 cm with 3 cm EPR/   . PCOS (polycystic ovarian syndrome)   . Rheumatoid arthritis ([place])   . Seasonal allergies   . Sleep apnea, obstructive    uses CPAP nightly - had sleep study 08/22/2007   Social History  Substance Use Topics  . Smoking status: Former Smoker    Years: 10.00    Quit date: 05/24/2010  . Smokeless tobacco: Never Used  . Alcohol use 0.0 oz/week     Comment: rarely   Family History  Problem Relation Age of Onset  . Other Mother   . Diabetes Maternal Aunt   . Heart attack Maternal Aunt   . Alzheimer's disease Maternal Uncle   . Cancer Maternal Uncle     prostate  . Diabetes Paternal Aunt   . Cancer Paternal Aunt     brain,mouth  . Alzheimer's disease Paternal Aunt   . Diabetes Paternal Uncle   . Diabetes Maternal Grandmother   . Diabetes Maternal Grandfather   . Leukemia Cousin   . Other Other   . Obesity Other   . Other Other    Review of Systems: ROS in HPI. Otherwise: Review of Systems  Constitutional: Negative for chills, fever and weight loss.  Respiratory: Negative for  cough and shortness of breath.   Cardiovascular: Negative for chest pain and leg swelling.  Gastrointestinal: Negative for abdominal pain, constipation, diarrhea, nausea and vomiting.  Genitourinary: Negative for dysuria, frequency and urgency.   Physical Exam: Vitals:   03/19/16 1352  BP: 130/61  Pulse: 88  Temp: 98.2 F (36.8 C)  TempSrc: Oral  SpO2: 97%  Weight: (!) 305 lb (138.3 kg)  Height: 5' 2"  (1.575 m)   Physical Exam  Constitutional: She appears well-developed. She is cooperative. No distress.  Cardiovascular: Normal rate, regular rhythm, normal heart sounds and normal pulses.  Exam reveals no gallop.   No murmur heard. Pulmonary/Chest: Effort normal and breath sounds normal. No respiratory distress. She has no wheezes. She has no rhonchi. She has no rales. Breasts are symmetrical.  Abdominal: Soft. Bowel sounds are normal. There is no tenderness.  Musculoskeletal: She exhibits no edema.    Assessment & Plan:  See encounters tab for problem based medical decision making. Patient discussed with Dr. Dareen Piano  Diabetes mellitus type 2, uncontrolled ([place]) Patient was seen several days ago for uncontrolled hyperglycemia. At that time she was on glipizide 10 mg twice a day and 62  units of Lantus every morning. Extended release metformin 500 mg daily was added in order to facilitate insulin sensitivity. She presents today for follow-up of her blood glucose. Patient has tolerated the addition of metformin well.  She brings her blood glucose meter and examination of this data demonstrates: 3 readings in the morning with an average blood glucose 215 6 readings in the afternoon with an average blood glucose of 122 3 readings in the evening with an average blood glucose of 211  Assessment: Uncontrolled diabetes  Plan: Continue Lantus 62 units every morning, glipizide 10 mg twice a day. Increase metformin extended-release to 1000 mg daily.   Signed: Holley Raring,  MD 03/19/2016, 4:48 PM  Pager: [phone]

## 2016-03-19 NOTE — Patient Instructions (Addendum)
Thank you for referring your blood glucose meter today. It appears that your diabetes is under better control, but I would still like to treat her blood glucose lower than 200. I would like to increase her extended release metformin to 1000 mg every day, you can take 2 of her current pills in the morning. Continue all your other diabetes medications.

## 2016-03-19 NOTE — Progress Notes (Signed)
Internal Medicine Clinic Attending  Case discussed with Dr. Gay Filler at the time of the visit.  We reviewed the resident's history and exam and pertinent patient test results.  I agree with the assessment, diagnosis, and plan of care documented in the resident's note.

## 2016-04-01 ENCOUNTER — Other Ambulatory Visit: Payer: Self-pay | Admitting: Pediatrics

## 2016-04-01 DIAGNOSIS — J302 Other seasonal allergic rhinitis: Secondary | ICD-10-CM

## 2016-04-09 ENCOUNTER — Other Ambulatory Visit: Payer: Self-pay | Admitting: Pediatrics

## 2016-04-09 ENCOUNTER — Other Ambulatory Visit: Payer: Self-pay | Admitting: Internal Medicine

## 2016-04-09 DIAGNOSIS — J302 Other seasonal allergic rhinitis: Secondary | ICD-10-CM

## 2016-04-12 ENCOUNTER — Encounter: Payer: Self-pay | Admitting: *Deleted

## 2016-04-14 ENCOUNTER — Other Ambulatory Visit: Payer: Self-pay | Admitting: Internal Medicine

## 2016-05-04 ENCOUNTER — Telehealth: Payer: Self-pay

## 2016-05-04 NOTE — Telephone Encounter (Signed)
Ana Long with [facility] received order.

## 2016-05-04 NOTE — Telephone Encounter (Signed)
Pt presented to the lobby today to speak to a nurse regarding her cpap. Pt claims that at her visit 1.5 years ago, her cpap chip was removed by [facility] and never put back. She was told that [facility] will send her a chip but this has never happened. Because of this she has not been able to provide data from her cpap chip. Pt is asking that I reach out to [facility] to get this resolved, and then call her and let her know what [facility] says when I reach out to them.  [place], I have reached out to [facility]. I have included you in this communication. Will you please follow up with this? Thanks!

## 2016-05-05 NOTE — Telephone Encounter (Signed)
See other phone message  

## 2016-05-07 NOTE — Telephone Encounter (Signed)
I called pt.  She had not heard from them.  I called [facility] they LM for her to return call.  I called pt back and told her to call [phone].  She will do this now.

## 2016-05-07 NOTE — Telephone Encounter (Signed)
I called pt and she did speak with Valetta Fuller about her order and about getting chip for her machine.   They will put in the mail for her.  She will call back if needed.

## 2016-05-20 ENCOUNTER — Telehealth: Payer: Self-pay | Admitting: Internal Medicine

## 2016-05-20 NOTE — Telephone Encounter (Signed)
APT. REMINDER CALL, LMTCB °

## 2016-05-24 ENCOUNTER — Ambulatory Visit (INDEPENDENT_AMBULATORY_CARE_PROVIDER_SITE_OTHER): Payer: Medicaid Other | Admitting: Internal Medicine

## 2016-05-24 ENCOUNTER — Encounter (INDEPENDENT_AMBULATORY_CARE_PROVIDER_SITE_OTHER): Payer: Self-pay

## 2016-05-24 ENCOUNTER — Encounter: Payer: Self-pay | Admitting: Internal Medicine

## 2016-05-24 VITALS — BP 128/63 | HR 94 | Temp 98.3°F | Wt 306.0 lb

## 2016-05-24 DIAGNOSIS — Z6841 Body Mass Index (BMI) 40.0 and over, adult: Secondary | ICD-10-CM | POA: Diagnosis not present

## 2016-05-24 DIAGNOSIS — Z794 Long term (current) use of insulin: Secondary | ICD-10-CM

## 2016-05-24 DIAGNOSIS — E113392 Type 2 diabetes mellitus with moderate nonproliferative diabetic retinopathy without macular edema, left eye: Secondary | ICD-10-CM

## 2016-05-24 DIAGNOSIS — K5900 Constipation, unspecified: Secondary | ICD-10-CM

## 2016-05-24 DIAGNOSIS — E1165 Type 2 diabetes mellitus with hyperglycemia: Secondary | ICD-10-CM

## 2016-05-24 DIAGNOSIS — IMO0001 Reserved for inherently not codable concepts without codable children: Secondary | ICD-10-CM

## 2016-05-24 DIAGNOSIS — J4530 Mild persistent asthma, uncomplicated: Secondary | ICD-10-CM | POA: Diagnosis not present

## 2016-05-24 DIAGNOSIS — J302 Other seasonal allergic rhinitis: Secondary | ICD-10-CM

## 2016-05-24 DIAGNOSIS — Z833 Family history of diabetes mellitus: Secondary | ICD-10-CM

## 2016-05-24 DIAGNOSIS — Z87891 Personal history of nicotine dependence: Secondary | ICD-10-CM

## 2016-05-24 DIAGNOSIS — E113291 Type 2 diabetes mellitus with mild nonproliferative diabetic retinopathy without macular edema, right eye: Secondary | ICD-10-CM | POA: Diagnosis not present

## 2016-05-24 DIAGNOSIS — Z8249 Family history of ischemic heart disease and other diseases of the circulatory system: Secondary | ICD-10-CM | POA: Diagnosis not present

## 2016-05-24 DIAGNOSIS — E113299 Type 2 diabetes mellitus with mild nonproliferative diabetic retinopathy without macular edema, unspecified eye: Secondary | ICD-10-CM

## 2016-05-24 LAB — POCT GLYCOSYLATED HEMOGLOBIN (HGB A1C): Hemoglobin A1C: 8.4

## 2016-05-24 LAB — GLUCOSE, CAPILLARY: Glucose-Capillary: 159 mg/dL — ABNORMAL HIGH (ref 65–99)

## 2016-05-24 MED ORDER — NASONEX 50 MCG/ACT NA SUSP: 2.0000 | Freq: Every day | NASAL | 5 refills | Status: DC

## 2016-05-24 MED ORDER — METFORMIN HCL ER 500 MG PO TB24: 1000.0000 mg | ORAL_TABLET | Freq: Every day | ORAL | 2 refills | Status: DC

## 2016-05-24 MED ORDER — LACTULOSE 10 G PO PACK: 10.0000 g | PACK | Freq: Every day | ORAL | 0 refills | Status: DC | PRN

## 2016-05-24 MED ORDER — INSULIN GLARGINE 100 UNIT/ML SOLOSTAR PEN: 70.0000 [IU] | PEN_INJECTOR | Freq: Every morning | SUBCUTANEOUS | 12 refills | Status: DC

## 2016-05-24 MED ORDER — ALBUTEROL SULFATE HFA 108 (90 BASE) MCG/ACT IN AERS: 2.0000 | INHALATION_SPRAY | RESPIRATORY_TRACT | 2 refills | Status: AC | PRN

## 2016-05-24 NOTE — Progress Notes (Signed)
CC: DM f/u HPI: Ana Long is a 41 y.o. female with a h/o of OSA/OHS, IDDM w/ DR, asthma, POP, morbid obesity who presents for diabetes f/u.  Please see Problem-based charting for HPI and the status of patient's chronic medical conditions.  Past Medical History:  Diagnosis Date  . Arthritis    major joints  . Asthma    prn inhaler  . Carpal tunnel syndrome of right wrist 09/2012  . Complication of anesthesia    states is hard to wake up post-op  . Diabetes mellitus    intolerant to Metformin  . Dry skin    hands   . Family history of anesthesia complication    mother went into coma during c-section  . GERD (gastroesophageal reflux disease)   . Herpes genital   . Morbid obesity with BMI of 50 to 60 07/23/2009  . Obesity hypoventilation syndrome ([place]) 10/30/2012  . Obesity, morbid ([place]) 01/10/2014  . OSA on CPAP     AHI was on  11-17-12 to 120/hr, titrated to 15 cm with 3 cm EPR/   . PCOS (polycystic ovarian syndrome)   . Rheumatoid arthritis ([place])   . Seasonal allergies   . Sleep apnea, obstructive    uses CPAP nightly - had sleep study 08/22/2007   Social History  Substance Use Topics  . Smoking status: Former Smoker    Years: 10.00    Quit date: 05/24/2010  . Smokeless tobacco: Never Used  . Alcohol use 0.0 oz/week     Comment: rarely   Family History  Problem Relation Age of Onset  . Other Mother   . Diabetes Maternal Aunt   . Heart attack Maternal Aunt   . Alzheimer's disease Maternal Uncle   . Cancer Maternal Uncle     prostate  . Diabetes Paternal Aunt   . Cancer Paternal Aunt     brain,mouth  . Alzheimer's disease Paternal Aunt   . Diabetes Paternal Uncle   . Diabetes Maternal Grandmother   . Diabetes Maternal Grandfather   . Leukemia Cousin   . Other Other   . Obesity Other   . Other Other    Review of Systems: ROS in HPI. Otherwise: Review of Systems  Constitutional: Negative for chills, fever and weight loss.  Respiratory:  Positive for wheezing. Negative for cough and shortness of breath.   Cardiovascular: Positive for leg swelling. Negative for chest pain.  Gastrointestinal: Positive for diarrhea. Negative for abdominal pain, constipation, nausea and vomiting.  Genitourinary: Negative for dysuria, frequency and urgency.  Endo/Heme/Allergies: Negative for polydipsia.   Physical Exam: Vitals:   05/24/16 1117  BP: 128/63  Pulse: 94  Temp: 98.3 F (36.8 C)  TempSrc: Oral  SpO2: 97%  Weight: (!) 306 lb (138.8 kg)   Physical Exam  Constitutional: She appears well-developed. She is cooperative.  Morbidly obese lady in NAD.  Cardiovascular: Normal rate, regular rhythm, normal heart sounds and normal pulses.  Exam reveals no gallop.   No murmur heard. Pulmonary/Chest: Effort normal and breath sounds normal. No respiratory distress. She has no wheezes. She has no rhonchi. She has no rales. Breasts are symmetrical.  Abdominal: Soft. Bowel sounds are normal. There is no tenderness.  Musculoskeletal: She exhibits no edema.  Skin: Skin is warm and dry.   Assessment & Plan:  See encounters tab for problem based medical decision making. Patient discussed with Dr. Angelia Mould  Diabetes mellitus type 2, uncontrolled ([place]) Patient presents for follow-up of her  diabetes mellitus today. She was recently increased from 500 mg extemded release metformin to 1000 mg extended release order to control her hyperglycemia, but the pt reports that she was unable to get this covered under her insurance and has only been taking 53m ER. She is also taking 70 units of Lantus daily as well as 10 mg glipizide twice a day. She brings her glucometer with her today she has 18 readings which are above goal of 180, 12 readings within range from 80-180, and no episodes of hypoglycemia recorded. Her average blood glucose reading was 196. A1c is down to 8.4 from 9.3. Wt is stable.  Patient denies any symptoms of hyperglycemia such as  polydipsia or polyuria.  Assessment: Poorly controlled diabetes mellitus with improvement in A1c.  Plan: Prescribed 2 tabs of 500 mg extended release metformin Continue current Lantus dosing is 70 units Continue glipizide 10 mg twice a day  Would consider adding GLP-1 agonist to current regimen at future visit given its associations with weight loss.  Non-proliferative diabetic retinopathy ([place] Patient has Mild background diabetic retinopathy the right eye and moderate nonproliferative diabetic retinopathy in the left eye. She should be followed by ophthalmology annually for this. Patient is important this upcoming month with Dr. YEarl Galaand optometry.  Plan: Continue annual screening can follow up with Ophthalmology.  Morbid obesity with BMI of 50 to 60 Patient has morbid obesity and multiple comorbid is related to this. She is currently stable and her weight and is not currently on the phentermine weight loss supplements that she has tried in the past. She is attempting to use dietary modification to help with this. She is motivated but reports that she often has limitations of time in preparing healthier foods. She is also trying to encourage increased activity by parking of the back parking lots when she is out to increase walking we also discussed possibility of starting additional diabetes medications that may have positive impact on her weight loss such as GLP-1 agonist.  Assessment: Morbid obesity, motivated for change but with significant barriers such as time and large insulin requirement  Plan: Continue dietary modifications and increased activity Attempt to provide medical support with GLP-1 agonist at next visit  Constipation Patient reports a constant patient is somewhat improved. She reports that she does have some mild GI symptoms from her metformin but this is tolerable and the benefits of glucose control are significantly better than the current side effects. Patient  does reports she's been able to get lactulose recently due to no refills and requests a refill of this. Refills provided today.  Asthma Patient reports that she has been doing well on her Flovent medication and has had limited side effects. She is tolerating this much better than the Qvar. She does however experience some wheezing and shortness of breath with activity and these episodes have increased with the changing incisions and increase in pollen reports she was unaware that she was able to take albuterol as needed for these symptoms, patient thought she was not able to take Flovent and albuterol together. I corrected this misunderstanding and provided the patient with a refill of her albuterol inhaler.  Assessment: Well controlled mild persistent asthma exacerbated by seasonal allergies  Plan: Continue Flovent twice a day Refilled albuterol provided Continue Nasonex and Claritin for allergy control   Signed: BHolley Raring MD 05/24/2016, 12:05 PM  Pager: [phone]

## 2016-05-24 NOTE — Patient Instructions (Signed)
Please take 2 tablets of the 500 mg extended release metformin. Continue to take your insulin and glipizide medications.  I have refilled several of your other medications including your albuterol inhaler. He should take your albuterol we do have symptoms of asthma such as wheezing or shortness of breath. Please continue to take her Flovent every day as this will help control and prevent future exacerbations. We will also be important to take her allergy medications during the spring season with increased pollen in the air.  Congratulations on your improvement in A1c we will continue his progress and I plan to see back in 3 months.

## 2016-05-24 NOTE — Assessment & Plan Note (Signed)
Patient reports a constant patient is somewhat improved. She reports that she does have some mild GI symptoms from her metformin but this is tolerable and the benefits of glucose control are significantly better than the current side effects. Patient does reports she's been able to get lactulose recently due to no refills and requests a refill of this. Refills provided today.

## 2016-05-24 NOTE — Assessment & Plan Note (Signed)
Patient has morbid obesity and multiple comorbid is related to this. She is currently stable and her weight and is not currently on the phentermine weight loss supplements that she has tried in the past. She is attempting to use dietary modification to help with this. She is motivated but reports that she often has limitations of time in preparing healthier foods. She is also trying to encourage increased activity by parking of the back parking lots when she is out to increase walking we also discussed possibility of starting additional diabetes medications that may have positive impact on her weight loss such as GLP-1 agonist.  Assessment: Morbid obesity, motivated for change but with significant barriers such as time and large insulin requirement  Plan: Continue dietary modifications and increased activity Attempt to provide medical support with GLP-1 agonist at next visit

## 2016-05-24 NOTE — Assessment & Plan Note (Signed)
Patient has Mild background diabetic retinopathy the right eye and moderate nonproliferative diabetic retinopathy in the left eye. She should be followed by ophthalmology annually for this. Patient is important this upcoming month with Dr. Earl Gala and optometry.  Plan: Continue annual screening can follow up with Ophthalmology.

## 2016-05-24 NOTE — Assessment & Plan Note (Addendum)
Patient presents for follow-up of her diabetes mellitus today. She was recently increased from 500 mg extemded release metformin to 1000 mg extended release order to control her hyperglycemia, but the pt reports that she was unable to get this covered under her insurance and has only been taking 510m ER. She is also taking 70 units of Lantus daily as well as 10 mg glipizide twice a day. She brings her glucometer with her today she has 18 readings which are above goal of 180, 12 readings within range from 80-180, and no episodes of hypoglycemia recorded. Her average blood glucose reading was 196. A1c is down to 8.4 from 9.3. Wt is stable.  Patient denies any symptoms of hyperglycemia such as polydipsia or polyuria.  Assessment: Poorly controlled diabetes mellitus with improvement in A1c.  Plan: Prescribed 2 tabs of 500 mg extended release metformin Continue current Lantus dosing is 70 units Continue glipizide 10 mg twice a day  Would consider adding GLP-1 agonist to current regimen at future visit given its associations with weight loss.

## 2016-05-24 NOTE — Assessment & Plan Note (Signed)
Patient reports that she has been doing well on her Flovent medication and has had limited side effects. She is tolerating this much better than the Qvar. She does however experience some wheezing and shortness of breath with activity and these episodes have increased with the changing incisions and increase in pollen reports she was unaware that she was able to take albuterol as needed for these symptoms, patient thought she was not able to take Flovent and albuterol together. I corrected this misunderstanding and provided the patient with a refill of her albuterol inhaler.  Assessment: Well controlled mild persistent asthma exacerbated by seasonal allergies  Plan: Continue Flovent twice a day Refilled albuterol provided Continue Nasonex and Claritin for allergy control

## 2016-05-27 ENCOUNTER — Encounter: Payer: Medicaid Other | Admitting: Internal Medicine

## 2016-05-27 ENCOUNTER — Telehealth: Payer: Self-pay | Admitting: *Deleted

## 2016-05-27 NOTE — Telephone Encounter (Signed)
Message from CoverMyMeds that patient's Nasonex is not on the formulary for Medicaid.  Request was sent to Dr. Mitzie Na for change to a covered medication such as Astepro Nasal Spray ,  Azelastine Spray, Fluticasone Spray, Ipratropium Spray or Pantanase Nasal Spray.  Sander Nephew, RN 05/27/2016 9:10 AM.

## 2016-05-28 NOTE — Progress Notes (Signed)
Internal Medicine Clinic Attending  Case discussed with Dr. Gay Filler at the time of the visit.  We reviewed the resident's history and exam and pertinent patient test results.  I agree with the assessment, diagnosis, and plan of care documented in the resident's note.

## 2016-05-29 ENCOUNTER — Other Ambulatory Visit: Payer: Self-pay | Admitting: Internal Medicine

## 2016-05-29 MED ORDER — FLUTICASONE PROPIONATE 50 MCG/ACT NA SUSP: 1.0000 | Freq: Every day | NASAL | 2 refills | Status: DC

## 2016-06-06 ENCOUNTER — Other Ambulatory Visit: Payer: Self-pay | Admitting: Internal Medicine

## 2016-07-30 ENCOUNTER — Encounter: Payer: Self-pay | Admitting: *Deleted

## 2016-08-16 ENCOUNTER — Other Ambulatory Visit: Payer: Self-pay | Admitting: Internal Medicine

## 2016-08-20 ENCOUNTER — Other Ambulatory Visit: Payer: Self-pay | Admitting: Internal Medicine

## 2016-08-31 ENCOUNTER — Other Ambulatory Visit: Payer: Self-pay | Admitting: Internal Medicine

## 2016-08-31 DIAGNOSIS — Z794 Long term (current) use of insulin: Principal | ICD-10-CM

## 2016-08-31 DIAGNOSIS — E1165 Type 2 diabetes mellitus with hyperglycemia: Principal | ICD-10-CM

## 2016-08-31 DIAGNOSIS — IMO0001 Reserved for inherently not codable concepts without codable children: Secondary | ICD-10-CM

## 2016-10-12 ENCOUNTER — Ambulatory Visit (INDEPENDENT_AMBULATORY_CARE_PROVIDER_SITE_OTHER): Payer: Medicaid Other | Admitting: Internal Medicine

## 2016-10-12 ENCOUNTER — Encounter: Payer: Self-pay | Admitting: Internal Medicine

## 2016-10-12 VITALS — BP 107/62 | HR 64 | Temp 97.9°F | Ht 62.0 in | Wt 318.3 lb

## 2016-10-12 DIAGNOSIS — J4530 Mild persistent asthma, uncomplicated: Secondary | ICD-10-CM

## 2016-10-12 DIAGNOSIS — E1165 Type 2 diabetes mellitus with hyperglycemia: Secondary | ICD-10-CM

## 2016-10-12 DIAGNOSIS — Z794 Long term (current) use of insulin: Secondary | ICD-10-CM

## 2016-10-12 DIAGNOSIS — E119 Type 2 diabetes mellitus without complications: Secondary | ICD-10-CM | POA: Diagnosis not present

## 2016-10-12 DIAGNOSIS — Z6841 Body Mass Index (BMI) 40.0 and over, adult: Secondary | ICD-10-CM | POA: Diagnosis not present

## 2016-10-12 MED ORDER — ALBUTEROL SULFATE (2.5 MG/3ML) 0.083% IN NEBU
2.5000 mg | INHALATION_SOLUTION | Freq: Once | RESPIRATORY_TRACT | Status: AC
  Administered 2016-10-12: 2.5 mg via RESPIRATORY_TRACT

## 2016-10-12 MED ORDER — ACCU-CHEK SOFT TOUCH LANCETS MISC: 12 refills | Status: DC

## 2016-10-12 MED ORDER — IPRATROPIUM BROMIDE 0.02 % IN SOLN
0.5000 mg | Freq: Once | RESPIRATORY_TRACT | Status: AC
  Administered 2016-10-12: 0.5 mg via RESPIRATORY_TRACT

## 2016-10-12 MED ORDER — IPRATROPIUM-ALBUTEROL 0.5-2.5 (3) MG/3ML IN SOLN: 3.0000 mL | Freq: Once | RESPIRATORY_TRACT | Status: DC

## 2016-10-12 NOTE — Progress Notes (Signed)
   CC: Chest tightness  HPI:  Ms.Harneet A Tesfaye-Ward is a 41 y.o. female with a PMHx significant for mild persistent asthma and uncontrolled Type 2 DM on insulin therapy presenting with chest tightness of 2 days duration. She recently had family in town that subsequently was diagnosed with pneumonia and an ear infection. She states the chest tightness feels very similar to asthma exacerbations she has had in the past. She acknowledges an increase in cough, the is productive of thin mucus. The chest tightness has been significantly impairing her sleep. Endorses N/V, loose stools, dizziness, and sinus pressure. Denies fevers, chills, HA, muscle aches, and tobacco use.   Past Medical History:  Diagnosis Date  . Arthritis    major joints  . Asthma    prn inhaler  . Carpal tunnel syndrome of right wrist 09/2012  . Complication of anesthesia    states is hard to wake up post-op  . Diabetes mellitus    intolerant to Metformin  . Dry skin    hands   . Family history of anesthesia complication    mother went into coma during c-section  . GERD (gastroesophageal reflux disease)   . Herpes genital   . Morbid obesity with BMI of 50 to 60 07/23/2009  . Obesity hypoventilation syndrome ([place]) 10/30/2012  . Obesity, morbid ([place]) 01/10/2014  . OSA on CPAP     AHI was on  11-17-12 to 120/hr, titrated to 15 cm with 3 cm EPR/   . PCOS (polycystic ovarian syndrome)   . Rheumatoid arthritis ([place])   . Seasonal allergies   . Sleep apnea, obstructive    uses CPAP nightly - had sleep study 08/22/2007   Review of Systems:   Denies chest pain, palpitations.  Endorses loose stools and N/V  Physical Exam: Vitals:   10/12/16 1611  BP: 107/62  Pulse: 64  Temp: 97.9 F (36.6 C)  TempSrc: Oral  SpO2: 100%  Weight: (!) 318 lb 4.8 oz (144.4 kg)  Height: 5' 2"  (1.575 m)   Physical Exam  Constitutional: She is oriented to person, place, and time. No distress.  Obese  HENT:  Head: Normocephalic and  atraumatic.  Eyes: Pupils are equal, round, and reactive to light. Conjunctivae are normal.  Cardiovascular: Normal rate, regular rhythm, normal heart sounds and intact distal pulses.   Pulmonary/Chest: Effort normal and breath sounds normal. No stridor. No respiratory distress. She has no wheezes. She has no rales.  Abdominal: Soft. Bowel sounds are normal. She exhibits no distension. There is no tenderness.  Musculoskeletal: Normal range of motion. She exhibits edema (mild pitting edema bilaterally).  Lymphadenopathy:    She has no cervical adenopathy.  Neurological: She is alert and oriented to person, place, and time.  Skin: Skin is warm and dry. Capillary refill takes less than 2 seconds.   Assessment & Plan:   See Encounters Tab for problem based charting.  Patient seen with Dr. Lynnae January

## 2016-10-12 NOTE — Patient Instructions (Addendum)
It was a pleasure to meet you today:  - Please double your Flovent for the next 7 days   - Please take your Albuterol for the next 7 days at least twice a day   - Please call or return to the clinic if your breathing does not improve  - Please bring your blood sugar monitor and recordings to next weeks appointment  Asthma, Acute Bronchospasm Acute bronchospasm caused by asthma is also referred to as an asthma attack. Bronchospasm means your air passages become narrowed. The narrowing is caused by inflammation and tightening of the muscles in the air tubes (bronchi) in your lungs. This can make it hard to breathe or cause you to wheeze and cough. What are the causes? Possible triggers are:  Animal dander from the skin, hair, or feathers of animals.  Dust mites contained in house dust.  Cockroaches.  Pollen from trees or grass.  Mold.  Cigarette or tobacco smoke.  Air pollutants such as dust, household cleaners, hair sprays, aerosol sprays, paint fumes, strong chemicals, or strong odors.  Cold air or weather changes. Cold air may trigger inflammation. Winds increase molds and pollens in the air.  Strong emotions such as crying or laughing hard.  Stress.  Certain medicines such as aspirin or beta-blockers.  Sulfites in foods and drinks, such as dried fruits and wine.  Infections or inflammatory conditions, such as a flu, cold, or inflammation of the nasal membranes (rhinitis).  Gastroesophageal reflux disease (GERD). GERD is a condition where stomach acid backs up into your esophagus.  Exercise or strenuous activity.  What are the signs or symptoms?  Wheezing.  Excessive coughing, particularly at night.  Chest tightness.  Shortness of breath. How is this diagnosed? Your health care provider will ask you about your medical history and perform a physical exam. A chest X-ray or blood testing may be performed to look for other causes of your symptoms or other  conditions that may have triggered your asthma attack. How is this treated? Treatment is aimed at reducing inflammation and opening up the airways in your lungs. Most asthma attacks are treated with inhaled medicines. These include quick relief or rescue medicines (such as bronchodilators) and controller medicines (such as inhaled corticosteroids). These medicines are sometimes given through an inhaler or a nebulizer. Systemic steroid medicine taken by mouth or given through an IV tube also can be used to reduce the inflammation when an attack is moderate or severe. Antibiotic medicines are only used if a bacterial infection is present. Follow these instructions at home:  Rest.  Drink plenty of liquids. This helps the mucus to remain thin and be easily coughed up. Only use caffeine in moderation and do not use alcohol until you have recovered from your illness.  Do not smoke. Avoid being exposed to secondhand smoke.  You play a critical role in keeping yourself in good health. Avoid exposure to things that cause you to wheeze or to have breathing problems.  Keep your medicines up-to-date and available. Carefully follow your health care provider's treatment plan.  Take your medicine exactly as prescribed.  When pollen or pollution is bad, keep windows closed and use an air conditioner or go to places with air conditioning.  Asthma requires careful medical care. See your health care provider for a follow-up as advised. If you are more than [redacted] weeks pregnant and you were prescribed any new medicines, let your obstetrician know about the visit and how you are doing. Follow up with  your health care provider as directed.  After you have recovered from your asthma attack, make an appointment with your outpatient doctor to talk about ways to reduce the likelihood of future attacks. If you do not have a doctor who manages your asthma, make an appointment with a primary care doctor to discuss your  asthma. Get help right away if:  You are getting worse.  You have trouble breathing. If severe, call your local emergency services (911 in the [place]  You develop chest pain or discomfort.  You are vomiting.  You are not able to keep fluids down.  You are coughing up yellow, green, brown, or bloody sputum.  You have a fever and your symptoms suddenly get worse.  You have trouble swallowing. This information is not intended to replace advice given to you by your health care provider. Make sure you discuss any questions you have with your health care provider. Document Released: 05/26/2006 Document Revised: 07/23/2015 Document Reviewed: 08/16/2012 Elsevier Interactive Patient Education  2017 [employer].

## 2016-10-12 NOTE — Assessment & Plan Note (Signed)
Chest tightness consistent with similar asthma exacerbations. No systemic signs of infection or sypmtoms/signs that would make me think she has pneumonia at this point. She was likely exposed to a virus. Breathing improved in clinic with Duo-neb.  Cardiac risk factors include uncontrolled DM. Pain is atypical and constant making cardiac etiology less likely.   Plan: - No indication for antibiotics  - Double Flovent for next 7 days  - Albuterol twice daily for next 7 days  - Given information about calling or presenting to ED if symptoms progress

## 2016-10-13 ENCOUNTER — Encounter: Payer: Self-pay | Admitting: Internal Medicine

## 2016-10-15 NOTE — Progress Notes (Signed)
Internal Medicine Clinic Attending  I saw and evaluated the patient.  I personally confirmed the key portions of the history and exam documented by Dr. Tarri Abernethy and I reviewed pertinent patient test results.  The assessment, diagnosis, and plan were formulated together and I agree with the documentation in the resident's note.

## 2016-10-21 ENCOUNTER — Encounter: Payer: Self-pay | Admitting: Internal Medicine

## 2016-10-21 ENCOUNTER — Ambulatory Visit (INDEPENDENT_AMBULATORY_CARE_PROVIDER_SITE_OTHER): Payer: Medicaid Other | Admitting: Internal Medicine

## 2016-10-21 VITALS — BP 116/41 | HR 64 | Temp 98.1°F | Ht 62.0 in | Wt 308.9 lb

## 2016-10-21 DIAGNOSIS — E1165 Type 2 diabetes mellitus with hyperglycemia: Secondary | ICD-10-CM | POA: Diagnosis not present

## 2016-10-21 DIAGNOSIS — J4530 Mild persistent asthma, uncomplicated: Secondary | ICD-10-CM

## 2016-10-21 DIAGNOSIS — J45909 Unspecified asthma, uncomplicated: Secondary | ICD-10-CM

## 2016-10-21 DIAGNOSIS — Z794 Long term (current) use of insulin: Secondary | ICD-10-CM

## 2016-10-21 DIAGNOSIS — Z8489 Family history of other specified conditions: Secondary | ICD-10-CM

## 2016-10-21 DIAGNOSIS — Z87891 Personal history of nicotine dependence: Secondary | ICD-10-CM | POA: Diagnosis not present

## 2016-10-21 DIAGNOSIS — Z6841 Body Mass Index (BMI) 40.0 and over, adult: Secondary | ICD-10-CM

## 2016-10-21 DIAGNOSIS — Z79899 Other long term (current) drug therapy: Secondary | ICD-10-CM | POA: Diagnosis not present

## 2016-10-21 DIAGNOSIS — Z833 Family history of diabetes mellitus: Secondary | ICD-10-CM

## 2016-10-21 LAB — POCT GLYCOSYLATED HEMOGLOBIN (HGB A1C): Hemoglobin A1C: 9.9

## 2016-10-21 LAB — GLUCOSE, CAPILLARY: Glucose-Capillary: 122 mg/dL — ABNORMAL HIGH (ref 65–99)

## 2016-10-21 MED ORDER — METFORMIN HCL ER 500 MG PO TB24: 500.0000 mg | ORAL_TABLET | Freq: Every day | ORAL | 11 refills | Status: DC

## 2016-10-21 MED ORDER — FLUTICASONE-SALMETEROL 250-50 MCG/DOSE IN AEPB: 1.0000 | INHALATION_SPRAY | Freq: Two times a day (BID) | RESPIRATORY_TRACT | 5 refills | Status: DC

## 2016-10-21 NOTE — Patient Instructions (Addendum)
It was a pleasure to see you again today.  - Continue to work on weight loss   - We switched your inhaler. Please let us know if this does not help.   - Please come back to see me in 3 months

## 2016-10-21 NOTE — Assessment & Plan Note (Signed)
Discussed that her weight is related to her multiple medical problems. She does not want help loosing weight at this point. She will try dietary changes. She is also going to try to start exercising. Discussed surgery, medications, and lifestyle modifications for ~25 minutes.

## 2016-10-21 NOTE — Assessment & Plan Note (Signed)
Continues to experience weekly symptoms, night time cough >2x per week, and wheezing. On Albuterol and Flovent however she does not use her albuterol. Will escalate therapy to corticosteroid and LABA.  Plan: - Stop Flovent  - Start Advair

## 2016-10-21 NOTE — Progress Notes (Signed)
CC: DM management   HPI:  [name] is a 41 y.o. female with uncontrolled type 2 diabetes mellitus with an A1c of 9.9. Per chart review she is supposed to be on Metformin XR 1000 mg, 70 units of Lantus, and Glipizide 10 mg BID. She does not take the Metformin but states she will start it. She continue to struggle with her eating habits. States she is a stress eater and has been under a lot of stress lately. She does not exercise regularly. She does not check her blood glucose on a regular basis. She want to start loosing weight and exercising but is not interested in any further medical management. We discussed adding a GLP-1, SLGT-2 inhibitor, and meal time insulin.   She also voices concerns about her Asthma. She was seen on 8/21 for chest tightness. A steroid shot was discussed but not done due to her uncontrolled diabetes mellitus. Instead her Flovent was doubled for 7 days. She continues to have chest tightness that limits her daily activities. She does have albuterol but refuses to use it due to how it makes her feel. She endorses asthma symptoms >2x per week, cough at night, and wheezing.   Past Medical History:  Diagnosis Date  . Arthritis    major joints  . Asthma    prn inhaler  . Carpal tunnel syndrome of right wrist 09/2012  . Complication of anesthesia    states is hard to wake up post-op  . Diabetes mellitus    intolerant to Metformin  . Dry skin    hands   . Family history of anesthesia complication    mother went into coma during c-section  . GERD (gastroesophageal reflux disease)   . Herpes genital   . Morbid obesity with BMI of 50 to 60 07/23/2009  . Obesity hypoventilation syndrome ([place]) 10/30/2012  . Obesity, morbid ([place]) 01/10/2014  . OSA on CPAP     AHI was on  11-17-12 to 120/hr, titrated to 15 cm with 3 cm EPR/   . PCOS (polycystic ovarian syndrome)   . Rheumatoid arthritis ([place])   . Seasonal allergies   . Sleep apnea, obstructive    uses CPAP  nightly - had sleep study 08/22/2007   Review of Systems  Constitutional: Negative.   HENT: Negative.   Eyes: Negative.   Respiratory: Positive for cough, shortness of breath and wheezing.   Cardiovascular: Negative.   Gastrointestinal: Negative.   Genitourinary: Negative.   Musculoskeletal: Negative.   Skin: Negative.   Neurological: Negative.    Physical Exam: Vitals:   10/21/16 1407  BP: (!) 116/41  Pulse: 64  Temp: 98.1 F (36.7 C)  TempSrc: Oral  SpO2: 98%  Weight: (!) 308 lb 14.4 oz (140.1 kg)  Height: 5' 2"  (1.575 m)   Physical Exam  Constitutional: She is oriented to person, place, and time.  Morbidly obese, no acute distress  HENT:  Head: Normocephalic and atraumatic.  Eyes: Pupils are equal, round, and reactive to light. Conjunctivae are normal.  Cardiovascular: Normal rate, regular rhythm, normal heart sounds and intact distal pulses.   Pulmonary/Chest: Effort normal and breath sounds normal. No respiratory distress. She has no wheezes.  Abdominal: Soft. Bowel sounds are normal.  Musculoskeletal: Normal range of motion. She exhibits no edema.  Neurological: She is alert and oriented to person, place, and time.   Assessment & Plan:   See Encounters Tab for problem based charting.  Patient seen with Dr. Angelia Mould

## 2016-10-21 NOTE — Assessment & Plan Note (Addendum)
A1c 9.9 today. She has not been taking her medications as prescribed. Discussed the need to start back on Metformin XR 1000 mg daily. She refused, stating she would only take 500 mg daily. She is not interested in starting any other medications at this point. We discussed starting a GLP-1, SLGT-2 inhibitor, and meal time insulin. She refused all therapies. Wants to work on lifestyle changes at this point. She is going to start Nutrisystem next week and work towards weight loss.   Plan: - Metformin XR 500 mg QHS - Lantus 70 units QHS - Wants to stay on the Glipizide 10 mg BID - Work on weight loss. Not interested in starting any other medications. Continually refuses.

## 2016-10-22 LAB — BMP8+ANION GAP
Anion Gap: 18 mmol/L (ref 10.0–18.0)
BUN / CREAT RATIO: 10 (ref 9–23)
BUN: 6 mg/dL (ref 6–24)
CHLORIDE: 100 mmol/L (ref 96–106)
CO2: 21 mmol/L (ref 20–29)
Calcium: 9.4 mg/dL (ref 8.7–10.2)
Creatinine, Ser: 0.61 mg/dL (ref 0.57–1.00)
GFR calc Af Amer: 129 mL/min/{1.73_m2} (ref >59)
GFR calc non Af Amer: 112 mL/min/{1.73_m2} (ref >59)
GLUCOSE: 106 mg/dL — AB (ref 65–99)
Potassium: 4 mmol/L (ref 3.5–5.2)
SODIUM: 139 mmol/L (ref 134–144)

## 2016-10-25 NOTE — Progress Notes (Signed)
Internal Medicine Clinic Attending  I saw and evaluated the patient.  I personally confirmed the key portions of the history and exam documented by Dr. Tarri Abernethy and I reviewed pertinent patient test results.  The assessment, diagnosis, and plan were formulated together and I agree with the documentation in the resident's note.

## 2016-11-01 IMAGING — CT CT RENAL STONE PROTOCOL
2 of 3 series · 17 of 41 positions shown, 19 images · non-contrast
Comparison: CT pelvis 07/01/2009

CLINICAL DATA: Mid abdominal pain radiating to the left flank for 3
days. Nausea and vomiting. Constipation. Difficulty urinating. Dark
urine.

EXAM:
CT ABDOMEN AND PELVIS WITHOUT CONTRAST
TECHNIQUE: Multidetector CT imaging of the abdomen and pelvis was performed
following the standard protocol without IV contrast.

[Series 3: coronal · coronal · 0.92mm/px · 3 of 96 slices shown]
[im 32/96  soft-tissue]
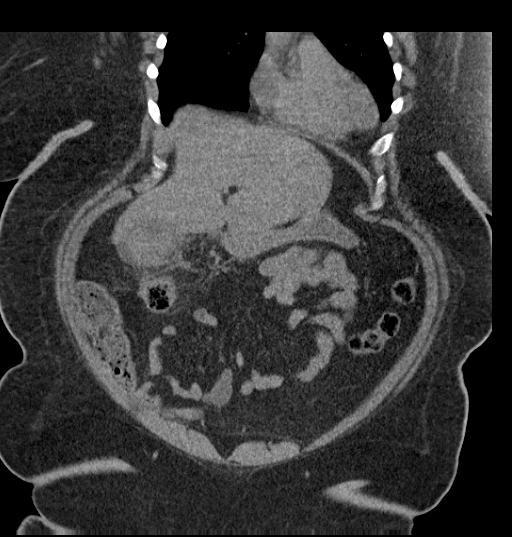
[im 43/96  soft-tissue]
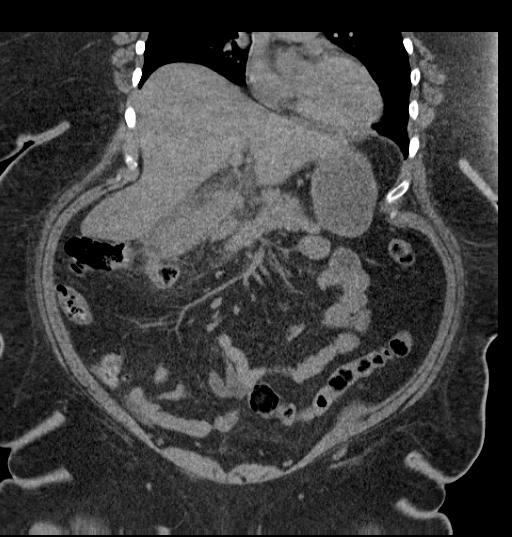
[im 53/96  soft-tissue]
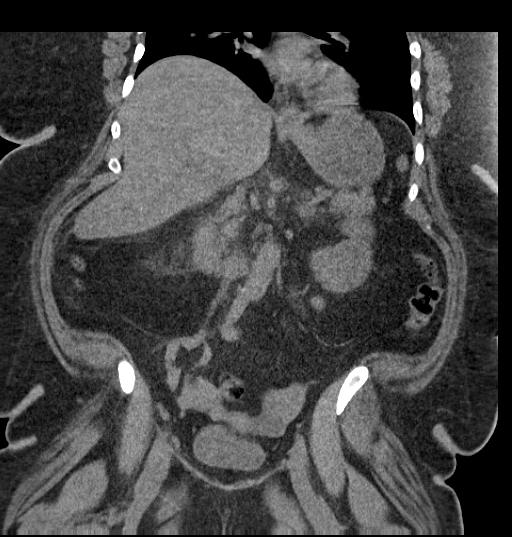

[Series 6: lung · axial · 0.89mm/px · z∈[-103,+17]mm · 14 of 27 slices shown, 16 images]
[im 2/27  soft-tissue]
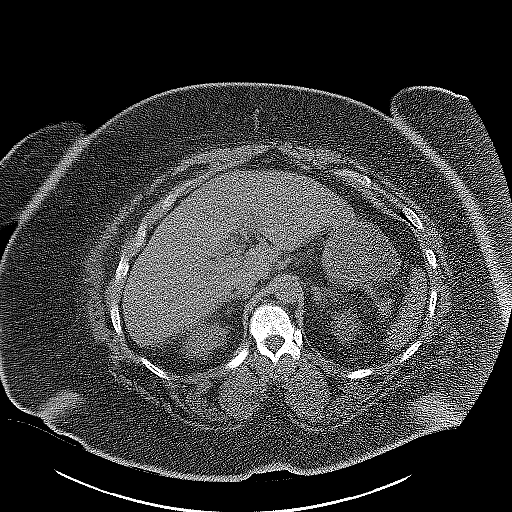
[im 2/27  bone]
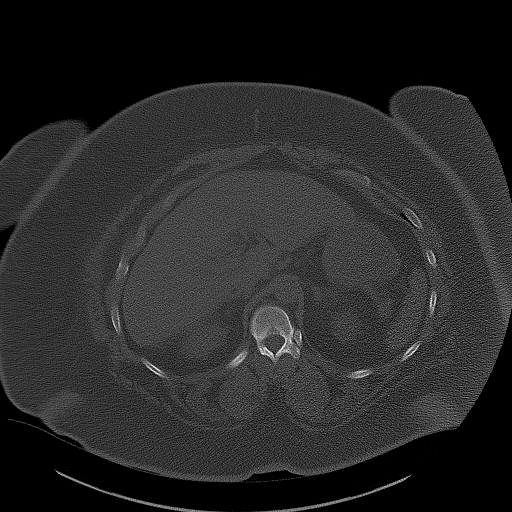
[im 4/27  soft-tissue]
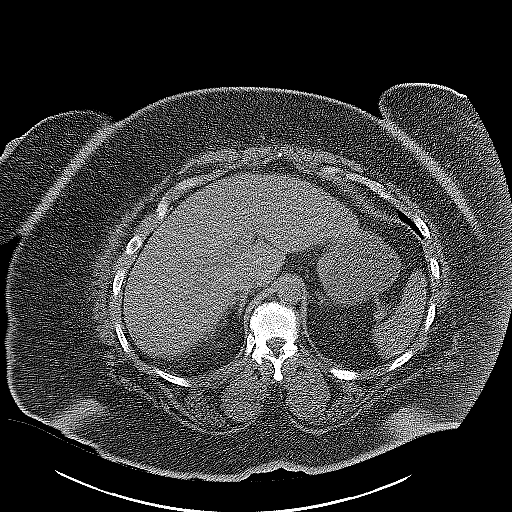
[im 6/27  soft-tissue]
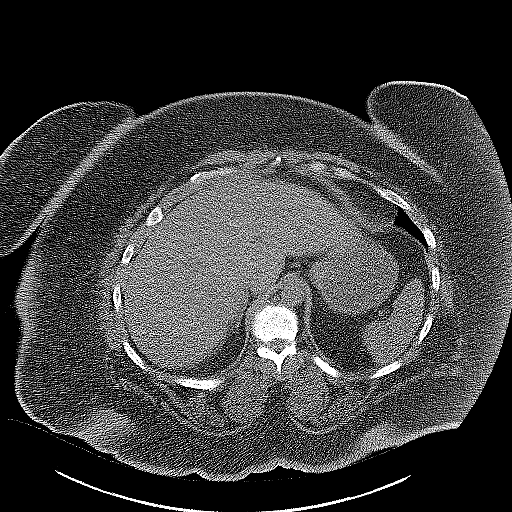
[im 8/27  soft-tissue]
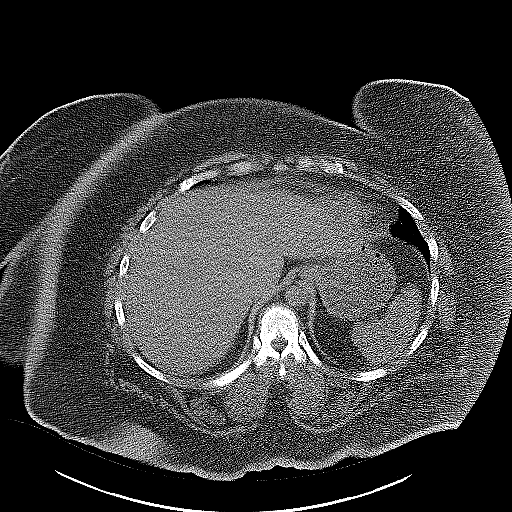
[im 10/27  soft-tissue]
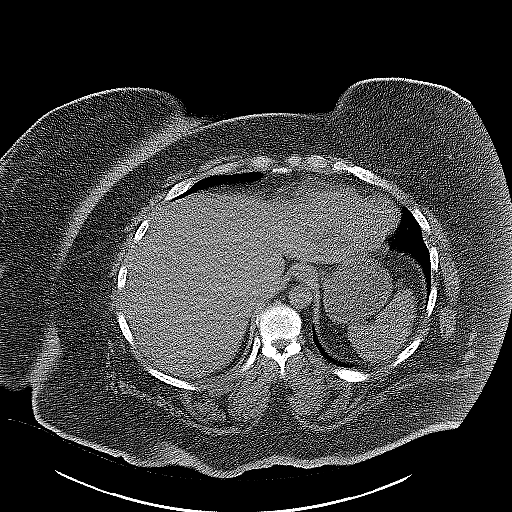
[im 11/27  soft-tissue]
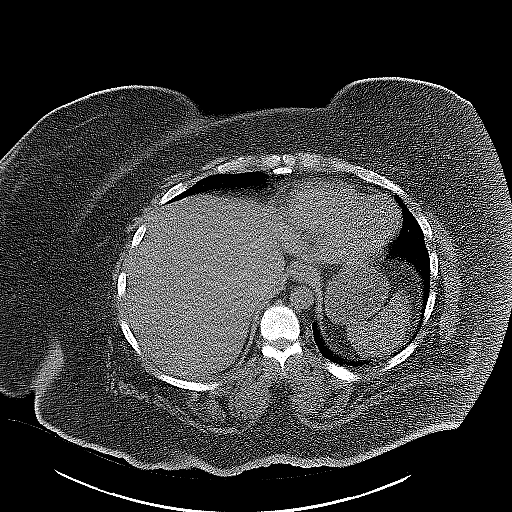
[im 13/27  soft-tissue]
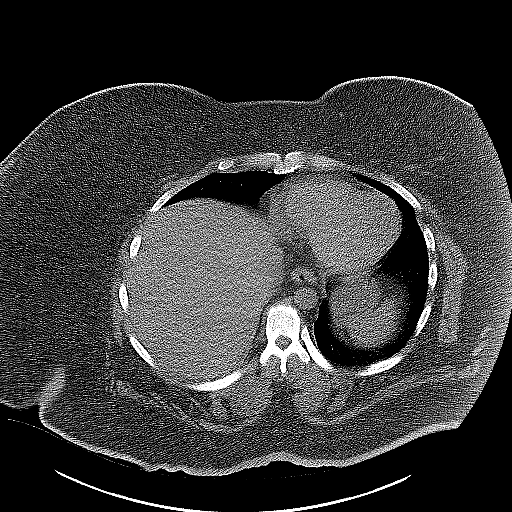
[im 15/27  soft-tissue]
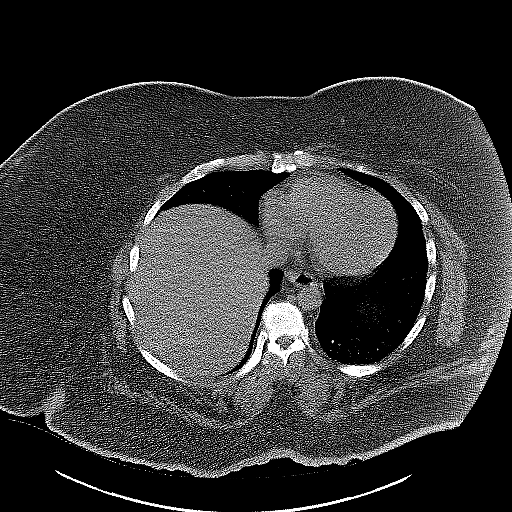
[im 17/27  soft-tissue]
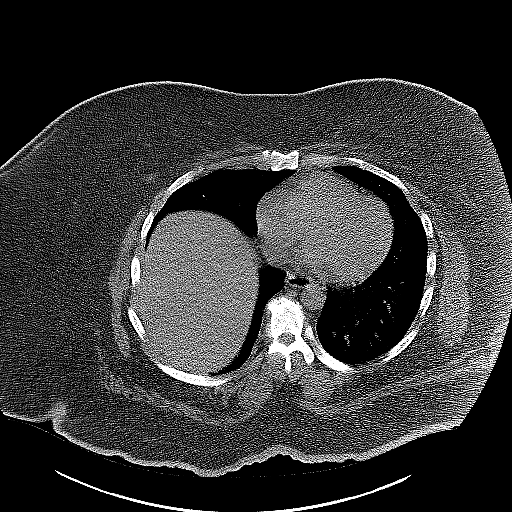
[im 17/27  bone]
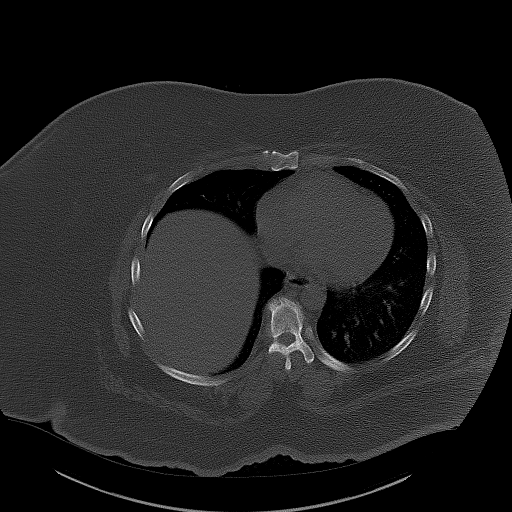
[im 18/27  soft-tissue]
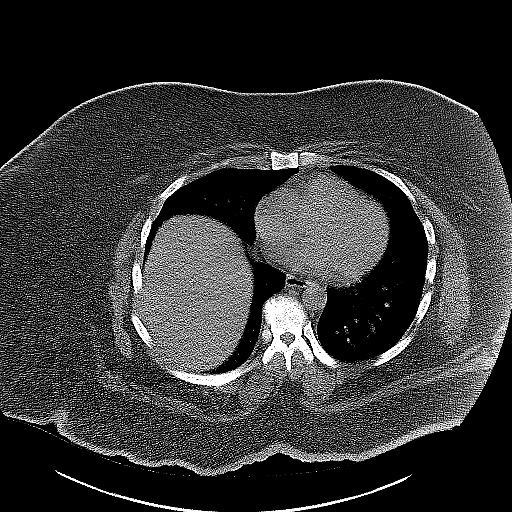
[im 20/27  soft-tissue]
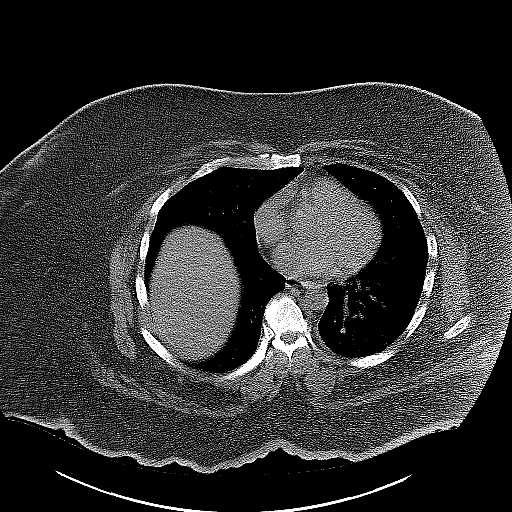
[im 22/27  soft-tissue]
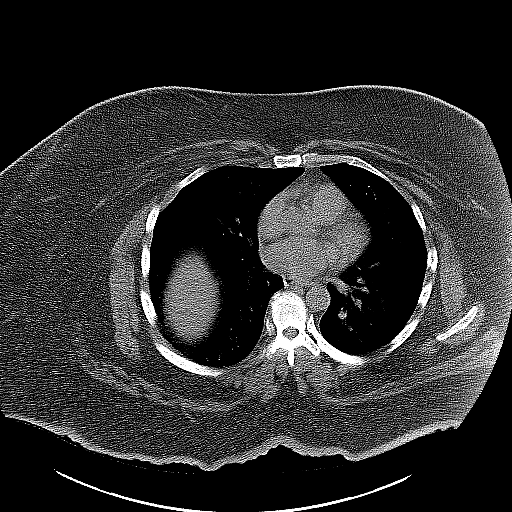
[im 24/27  soft-tissue]
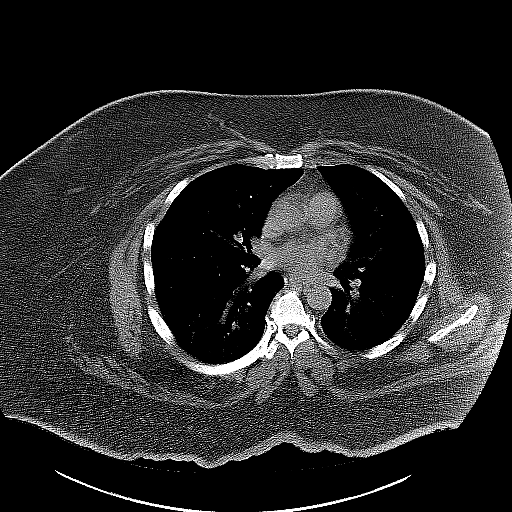
[im 26/27  soft-tissue]
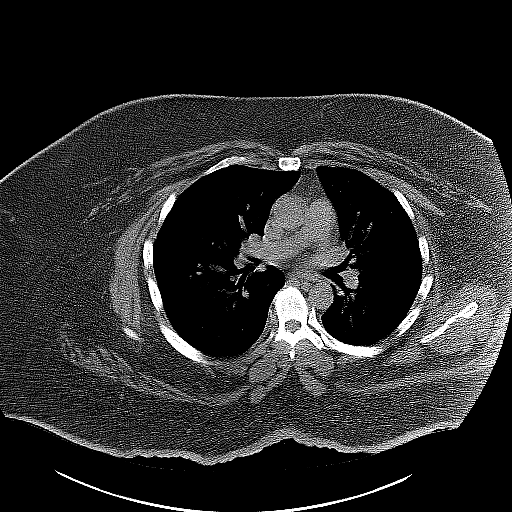

[17 of 41 positions shown; findings below may reference images not displayed]

FINDINGS: Lung bases are clear.

Kidneys are symmetrical in size and shape. No hydronephrosis or
hydroureter. No renal, ureteral, or bladder stones. Bladder wall is
not thickened.

The gallbladder is distended with slightly increased density
suggesting sludge. There is evidence of gallbladder wall thickening
with infiltration in the fat around the gallbladder. Appearance is
consistent with acute cholecystitis. Mild reactive inflammation in
the splenic flexure of the colon.

The unenhanced appearance of the liver, spleen, pancreas, adrenal
glands, abdominal aorta, inferior vena cava, and retroperitoneal
lymph nodes is unremarkable. Stomach, small bowel, and colon are
decompressed. Scattered stool in the colon. Small umbilical/
periumbilical hernia containing fat. No free air or free fluid in
the abdomen.

Pelvis: The appendix is normal. Uterus and ovaries are not enlarged.
Small amount of free fluid in the pelvis is likely physiologic. No
pelvic mass or lymphadenopathy. Degenerative changes in the lumbar
spine. No destructive bone lesions.
IMPRESSION: Inflammatory changes in the gallbladder as described consistent with
acute cholecystitis. No renal or ureteral stone or obstruction.

## 2016-11-01 IMAGING — CR DG ABDOMEN ACUTE W/ 1V CHEST
4 series · 4 of 4 positions shown · non-contrast
Comparison: Chest 07/25/2013

CLINICAL DATA: Upper bilateral abdominal pain radiating to the back
for 3 days. Nausea and vomiting.

EXAM:
DG ABDOMEN ACUTE W/ 1V CHEST

[w chest pa]
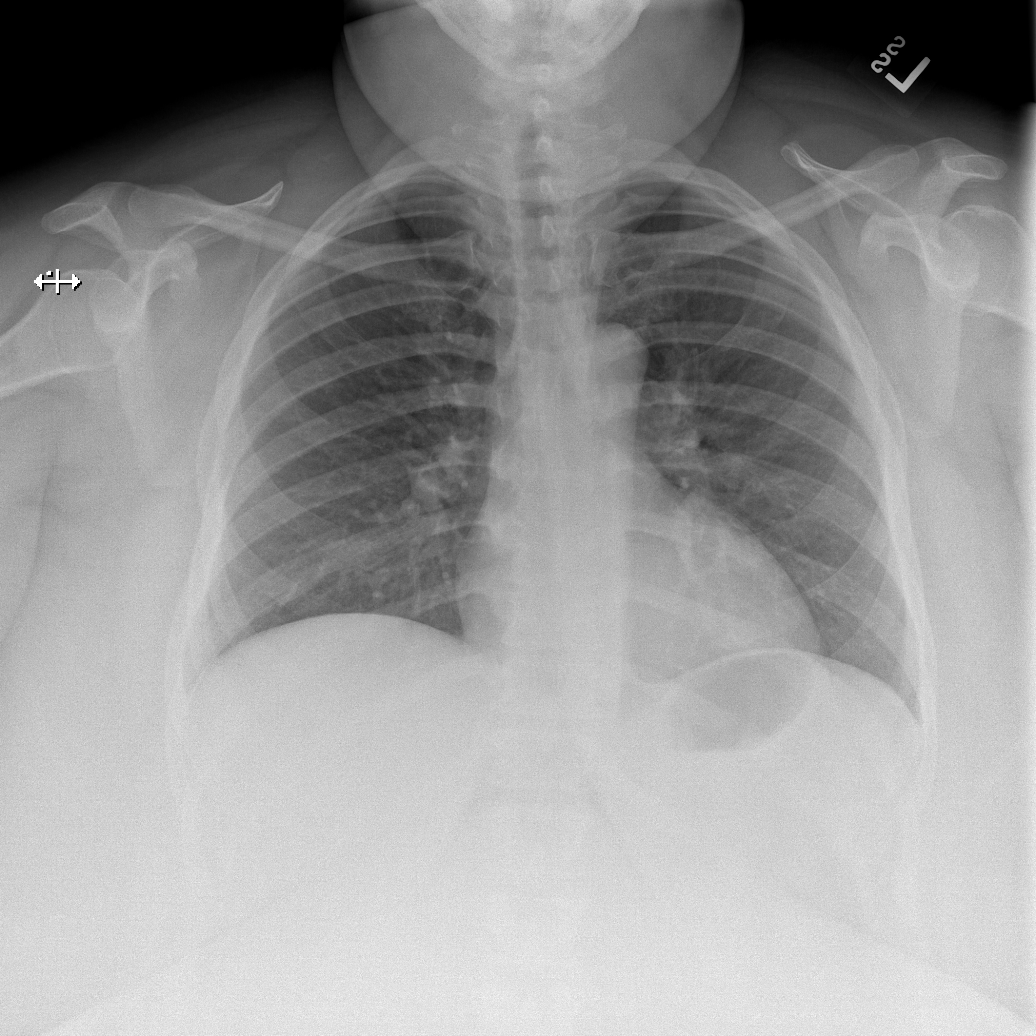

[w abdomen upright]
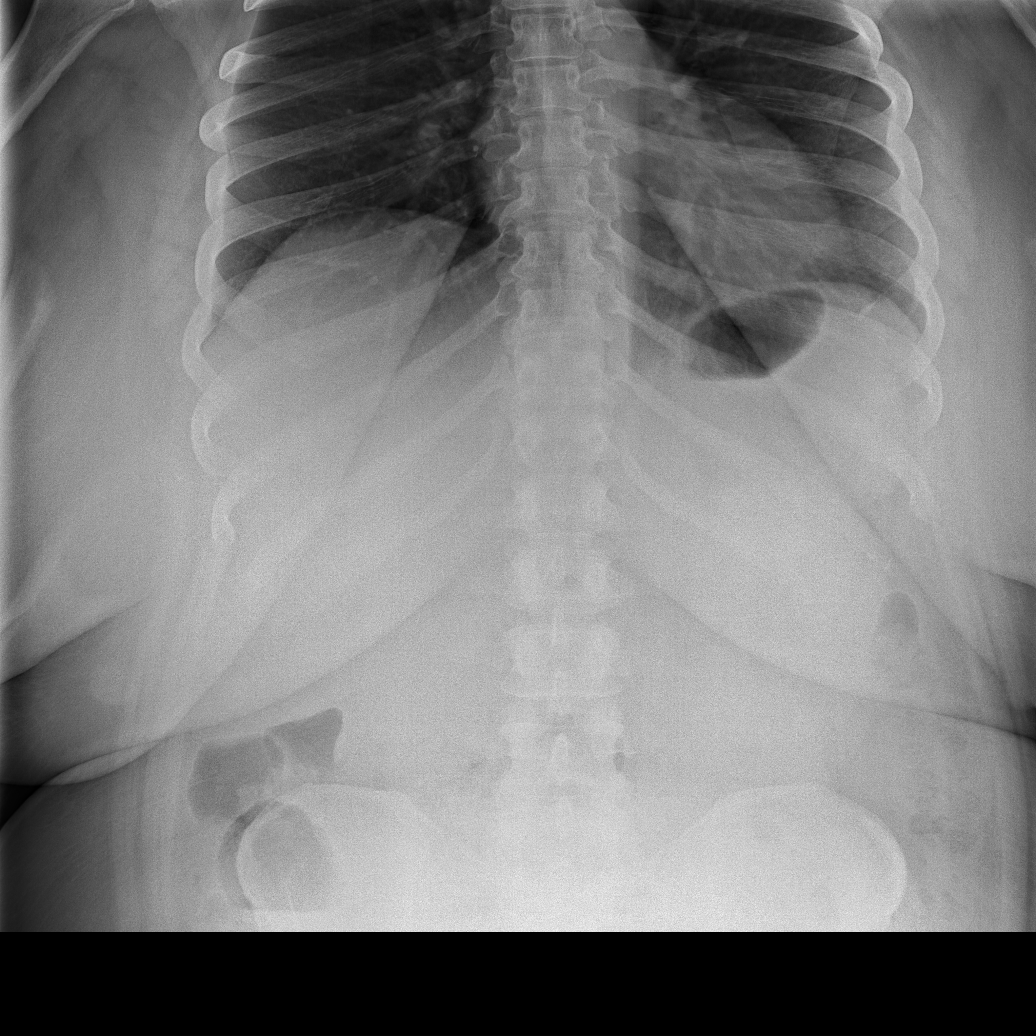

[t abdomen supine (1 of 2)]
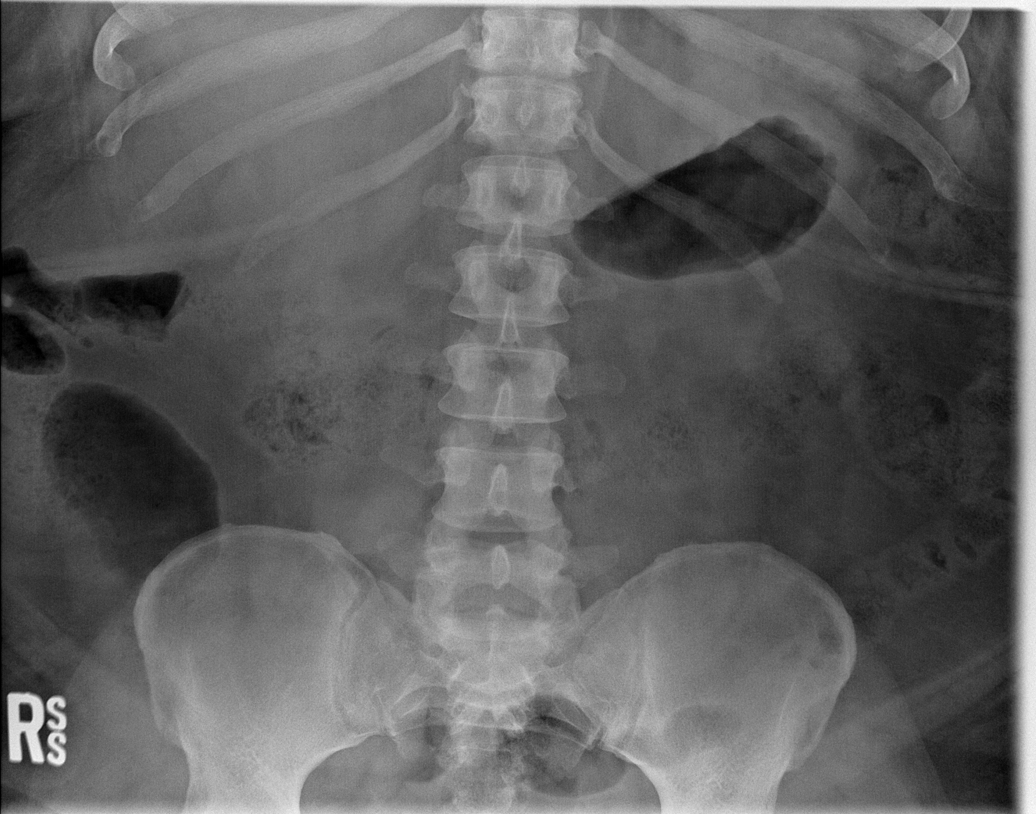

[t abdomen supine (2 of 2)]
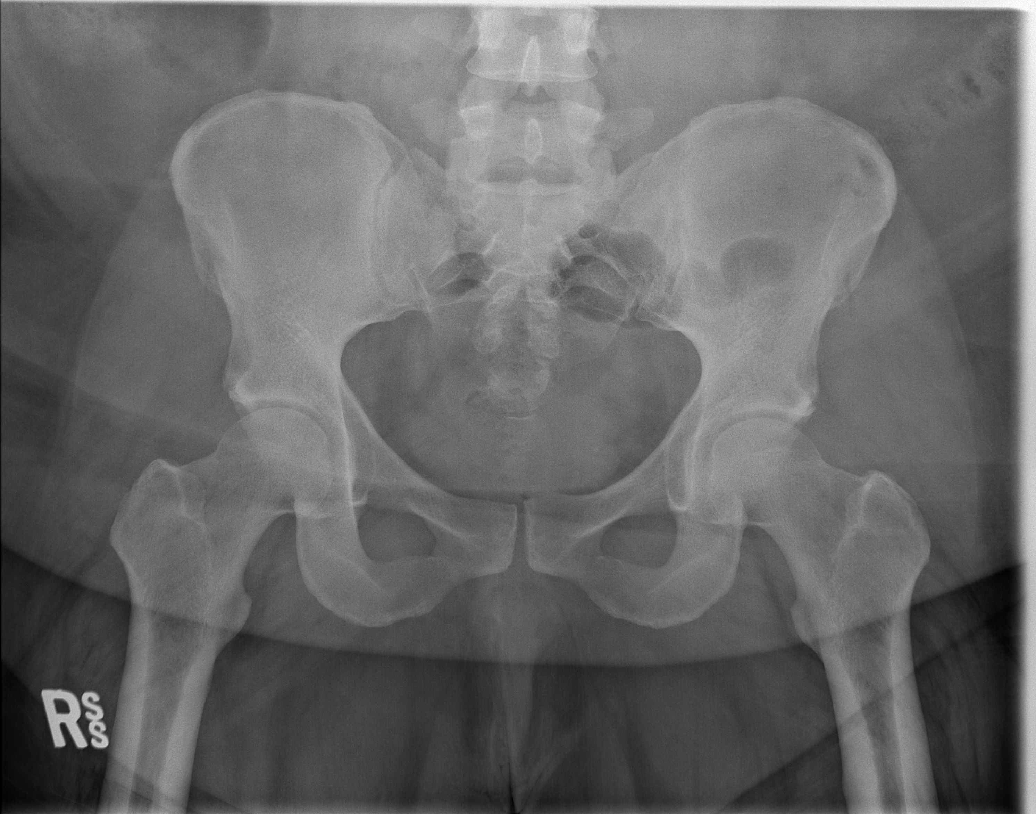

[4 of 4 positions shown; findings below may reference images not displayed]

FINDINGS: Normal heart size and pulmonary vascularity. No focal airspace
disease or consolidation in the lungs. No blunting of costophrenic
angles. No pneumothorax. Mediastinal contours appear intact.

Scattered gas and stool in the colon. No small or large bowel
distention. No free intra-abdominal air. No abnormal air-fluid
levels. No radiopaque stones. Visualized bones appear intact.
IMPRESSION: Negative abdominal radiographs.  No acute cardiopulmonary disease.

## 2016-11-23 ENCOUNTER — Other Ambulatory Visit: Payer: Self-pay

## 2016-11-23 DIAGNOSIS — E1165 Type 2 diabetes mellitus with hyperglycemia: Secondary | ICD-10-CM

## 2016-11-23 DIAGNOSIS — Z794 Long term (current) use of insulin: Principal | ICD-10-CM

## 2016-11-23 MED ORDER — INSULIN GLARGINE 100 UNIT/ML SOLOSTAR PEN: 70.0000 [IU] | PEN_INJECTOR | Freq: Every morning | SUBCUTANEOUS | 6 refills | Status: DC

## 2016-11-23 NOTE — Telephone Encounter (Signed)
Per patient Insulin Glargine (LANTUS SOLOSTAR) 100 UNIT/ML Solostar Pen, is not approved by the insurance. Pt is out of med, requesting the nurse to call back.

## 2016-11-23 NOTE — Telephone Encounter (Signed)
Call made to pharmacy for additional info, pharmacy unable to get prescribing MD's npi # to go through for medicaid.  Will send to attending for review and new rx.Despina Hidden Cassady10/2/20184:37 PM

## 2016-11-23 NOTE — Telephone Encounter (Signed)
Pt calls and states she needs PA on lantus solostar, she is completely out, can we do asap

## 2016-11-24 ENCOUNTER — Telehealth: Payer: Self-pay | Admitting: Internal Medicine

## 2016-11-24 NOTE — Telephone Encounter (Signed)
Pls call patient

## 2016-11-26 NOTE — Telephone Encounter (Signed)
Returned patient call: she finally got Lantus. Went without Lantus for 1-2 weeks, highest blood sugar during that time was 163.  She has been doing Nutrisystem diet and has lost 8 pounds.I encouraged and congratulated her and encouraged her to call as needed.

## 2016-12-09 ENCOUNTER — Ambulatory Visit: Payer: Self-pay | Admitting: Dietician

## 2017-01-17 ENCOUNTER — Other Ambulatory Visit: Payer: Self-pay | Admitting: Internal Medicine

## 2017-01-17 DIAGNOSIS — Z794 Long term (current) use of insulin: Principal | ICD-10-CM

## 2017-01-17 DIAGNOSIS — E1165 Type 2 diabetes mellitus with hyperglycemia: Principal | ICD-10-CM

## 2017-01-17 DIAGNOSIS — IMO0001 Reserved for inherently not codable concepts without codable children: Secondary | ICD-10-CM

## 2017-01-18 NOTE — Telephone Encounter (Signed)
Next appt scheduled  12/6 with PCP.

## 2017-01-27 ENCOUNTER — Encounter: Payer: Self-pay | Admitting: Internal Medicine

## 2017-02-04 ENCOUNTER — Other Ambulatory Visit: Payer: Self-pay

## 2017-02-04 ENCOUNTER — Telehealth: Payer: Self-pay | Admitting: Internal Medicine

## 2017-02-04 ENCOUNTER — Ambulatory Visit: Payer: Medicaid Other | Admitting: Internal Medicine

## 2017-02-04 DIAGNOSIS — E119 Type 2 diabetes mellitus without complications: Secondary | ICD-10-CM | POA: Diagnosis not present

## 2017-02-04 DIAGNOSIS — J45909 Unspecified asthma, uncomplicated: Secondary | ICD-10-CM | POA: Diagnosis not present

## 2017-02-04 DIAGNOSIS — Z87891 Personal history of nicotine dependence: Secondary | ICD-10-CM

## 2017-02-04 DIAGNOSIS — E785 Hyperlipidemia, unspecified: Secondary | ICD-10-CM | POA: Diagnosis not present

## 2017-02-04 DIAGNOSIS — K219 Gastro-esophageal reflux disease without esophagitis: Secondary | ICD-10-CM | POA: Diagnosis not present

## 2017-02-04 DIAGNOSIS — G4733 Obstructive sleep apnea (adult) (pediatric): Secondary | ICD-10-CM | POA: Diagnosis not present

## 2017-02-04 DIAGNOSIS — J069 Acute upper respiratory infection, unspecified: Secondary | ICD-10-CM

## 2017-02-04 DIAGNOSIS — Z8701 Personal history of pneumonia (recurrent): Secondary | ICD-10-CM | POA: Diagnosis not present

## 2017-02-04 NOTE — Telephone Encounter (Signed)
Pt states she has pain at sinuses, h/a, pressure, achy in general, appt 1545 [facility]

## 2017-02-04 NOTE — Telephone Encounter (Signed)
Pt would like a call back about her sinuses and medication.

## 2017-02-04 NOTE — Progress Notes (Signed)
   CC: cough, rhinorrhea   HPI:  Ana Long is a 41 y.o. with PMH T2Dm, Asthma, GERD, HLD, OSA who presents for acute concern of cough, rhinorrhea. Please see the assessment and plans for the status of the patient chronic medical problems.    Past Medical History:  Diagnosis Date  . Arthritis    major joints  . Asthma    prn inhaler  . Carpal tunnel syndrome of right wrist 09/2012  . Complication of anesthesia    states is hard to wake up post-op  . Diabetes mellitus    intolerant to Metformin  . Dry skin    hands   . Family history of anesthesia complication    mother went into coma during c-section  . GERD (gastroesophageal reflux disease)   . Herpes genital   . Morbid obesity with BMI of 50 to 60 07/23/2009  . Obesity hypoventilation syndrome ([place]) 10/30/2012  . Obesity, morbid ([place]) 01/10/2014  . OSA on CPAP     AHI was on  11-17-12 to 120/hr, titrated to 15 cm with 3 cm EPR/   . PCOS (polycystic ovarian syndrome)   . Rheumatoid arthritis ([place])   . Seasonal allergies   . Sleep apnea, obstructive    uses CPAP nightly - had sleep study 08/22/2007   Review of Systems:  Refer to history of present illness and assessment and plans for pertinent review of systems, all others reviewed and negative  Physical Exam:  Vitals:   02/04/17 1601 02/04/17 1605  BP: (!) 100/42 (!) 141/86  Pulse: 83 97  Temp: (!) 97.3 F (36.3 C)   SpO2: 100%   Weight: (!) 308 lb 11.2 oz (140 kg)   Height: 5' 2"  (1.575 m)    General: no acute distress HEENT: sounds congested, rhinorrhea, no sinus tenderness  Lungs: normal work of breathing, speaking in full sentences, no wheezes rhonchi or rales Cardiac: RRR, no murmurs appreciated, trace peripheral edema   Assessment & Plan:   Cough  Presents with one week of cough productive of scant amounts of light yellow sputum, rhinorrhea, frontal sinus pressure, itchy eyes ears and throat. She denies fever, sore throat, or neck pain. She  denies chest tightness or difficulty breathing but her family has told her that she was wheezing at home.  Has had pneumonia in the past, this is nothing like pneumonia or asthma exacerbation. Presentations similar to this have led to asthma exacerbation in the past.  Assessment: upper respiratory tract infection  Plan:  - will use tessalon perls that she has at home  - start sinus irrigation  - continue claritin and nasonex  - encouraged use of albuterol inhaler for wheeze or SOB and calling the clinic if this does not work to relieve her symptoms  -  Schedule RTC in one week, cancel the apt if she is feeling better  See Encounters Tab for problem based charting.  Patient discussed with Dr. Rebeca Alert

## 2017-02-04 NOTE — Assessment & Plan Note (Addendum)
Presents with one week of cough productive of scant amounts of light yellow sputum, rhinorrhea, frontal sinus pressure, itchy eyes ears and throat. She denies fever, sore throat, or neck pain. She denies chest tightness or difficulty breathing but her family has told her that she was wheezing at home.  Has had pneumonia in the past, this is nothing like pneumonia or asthma exacerbation. Presentations similar to this have led to asthma exacerbation in the past.  Assessment: upper respiratory tract infection  Plan:  - will use tessalon perls that she has at home  - start sinus irrigation  - continue claritin and nasonex  - encouraged use of albuterol inhaler for wheeze or SOB and calling the clinic if this does not work to relieve her symptoms  -  Schedule RTC in one week, cancel the apt if she is feeling better

## 2017-02-04 NOTE — Patient Instructions (Addendum)
FOLLOW-UP INSTRUCTIONS When: 1 week in the acute care clinic, you can cancel this appointment if your symptoms improve For: cough  What to bring:   Take halls with pectin cough drops, use a sinus wash, continue to use claritin and nasonex. Use the inhaler if you have a wheeze or difficulty breathing, call the clinic ASAP if the inhaler doesn't work.    You have a viral infection of your sinuses and upper respiratory tract. Unfortunately, we do not have any good antibiotics to treat this infection. However, it should resolve on its own in about 10 days.  In the meantime, there are several good remedies that will treat your symptoms and hopefully help to make you more comfortable. Please look for one of the over-the-counter medications below that will help with your congestion, cough, and post-nasal drip. Additionally, it will be very important to use nasal irrigation and nasal steroid spray to clear you sinus congestion. I have provided a recipe and instructions below.     BUFFERED ISOTONIC SALINE NASAL IRRIGATION  The Benefits:  1. When you irrigate, the isotonic saline (salt water) acts as a solvent and washes the mucus crusts and other debris from your nose.  2. This decongests and improves the airflow into your nose. The sinus passages begin to open.   Over the counter:  I recommend a Squirt Bottle form of saline to provide an adequate volume of irrigation to clear your sinuses.   Home Recipe:  1. Choose a 1-quart glass jar that is thoroughly cleansed.  2. Fill with sterile or distilled water, or you can boil water from the tap.  3. Add 1 to 2 heaping teaspoons of "pickling/canning/sea" salt (NOT table salt as it contains a large number of additives). This salt is available at the grocery store in the food canning section.  4. Mix ingredients together and store at room temperature. Discard after one week. If you find this solution too strong, you may decrease the  amount of salt added to 1 to 1  teaspoons. With children it is often best to start with a milder solution and advance slowly. Irrigate with 240 ml (8 oz) twice daily.  The Instructions:  You should plan to irrigate your nose with buffered isotonic saline 2 times per day. Many people prefer to warm the solution slightly in the microwave - but be sure that the solution is NOT HOT. Stand over the sink (some do this in the shower) and squirt the solution into each side of your nose, keeping your mouth open. This allows you to spit the saltwater out of your mouth. It will not harm you if you swallow a little.  If you have been told to use a nasal steroid such as Flonase, Nasonex, or Nasacort, you should always use isortonic saline solution first, then use your nasal steroid product. The nasal steroid is much more effective when sprayed onto clean nasal membranes and the steroid medicine will reach deeper into the nose.  Most people experience a little burning sensation the first few times they use a isotonic saline solution, but this usually goes away within a few days.

## 2017-02-08 NOTE — Progress Notes (Signed)
Internal Medicine Clinic Attending  Case discussed with Dr. Hetty Ely  at the time of the visit.  We reviewed the resident's history and exam and pertinent patient test results.  I agree with the assessment, diagnosis, and plan of care documented in the resident's note.  Oda Kilts, MD

## 2017-03-03 ENCOUNTER — Other Ambulatory Visit: Payer: Self-pay | Admitting: Student in an Organized Health Care Education/Training Program

## 2017-03-11 ENCOUNTER — Other Ambulatory Visit: Payer: Self-pay | Admitting: *Deleted

## 2017-03-11 MED ORDER — INSULIN PEN NEEDLE 31G X 5 MM MISC: 3 refills | Status: DC

## 2017-03-31 ENCOUNTER — Encounter: Payer: Self-pay | Admitting: Internal Medicine

## 2017-03-31 NOTE — Progress Notes (Deleted)
   CC: Follow-up of her diabetes mellitus and general health maintenance   HPI:  [name] is a 42 y.o.   Past Medical History:  Diagnosis Date  . Arthritis    major joints  . Asthma    prn inhaler  . Carpal tunnel syndrome of right wrist 09/2012  . Complication of anesthesia    states is hard to wake up post-op  . Diabetes mellitus    intolerant to Metformin  . Dry skin    hands   . Family history of anesthesia complication    mother went into coma during c-section  . GERD (gastroesophageal reflux disease)   . Herpes genital   . Morbid obesity with BMI of 50 to 60 07/23/2009  . Obesity hypoventilation syndrome ([place]) 10/30/2012  . Obesity, morbid ([place]) 01/10/2014  . OSA on CPAP     AHI was on  11-17-12 to 120/hr, titrated to 15 cm with 3 cm EPR/   . PCOS (polycystic ovarian syndrome)   . Rheumatoid arthritis ([place])   . Seasonal allergies   . Sleep apnea, obstructive    uses CPAP nightly - had sleep study 08/22/2007   Review of Systems:   12 point review of systems preformed. All negative aside from those mentioned in the HPI.   Physical Exam: There were no vitals filed for this visit. General: Obese female in no acute distress HENT: Normocephalic, atraumatic, moist mucus membranes, no LAD Pulm: Good air movement with no wheezing or crackles  CV: RRR, no murmurs, no rubs  Abdomen: Active bowel sounds, soft, non-distended, no tenderness to palpation  Extremities: No LE edema, warm and dry   Assessment & Plan:   See Encounters Tab for problem based charting.  Patient {GC/GE:3044014::"discussed with","seen with"} Dr. {NAMES:[id number]::"Butcher","Granfortuna","E. Hoffman","Klima","Mullen","Narendra","Raines","Vincent"}

## 2017-04-08 ENCOUNTER — Other Ambulatory Visit: Payer: Self-pay | Admitting: Student in an Organized Health Care Education/Training Program

## 2017-05-02 ENCOUNTER — Telehealth: Payer: Self-pay | Admitting: *Deleted

## 2017-05-02 NOTE — Telephone Encounter (Signed)
Received faxed refill request from pt's pharmacy for flovent hfa.  Medication no longer on pt's medication list, will send to pcp for review, please advise.Despina Hidden Cassady3/11/20192:37 PM

## 2017-05-05 NOTE — Telephone Encounter (Signed)
No longer on Flovent. Thank you.

## 2017-05-20 ENCOUNTER — Other Ambulatory Visit: Payer: Self-pay | Admitting: Internal Medicine

## 2017-05-20 NOTE — Telephone Encounter (Signed)
Will refill but I do not see where we have documented a hx or plan for her genital herpes.

## 2017-05-24 ENCOUNTER — Ambulatory Visit: Payer: Medicaid Other | Admitting: Family Medicine

## 2017-05-30 ENCOUNTER — Encounter: Payer: Self-pay | Admitting: Family Medicine

## 2017-05-30 ENCOUNTER — Ambulatory Visit: Payer: Medicaid Other | Admitting: Family Medicine

## 2017-05-30 DIAGNOSIS — M25511 Pain in right shoulder: Secondary | ICD-10-CM | POA: Diagnosis not present

## 2017-05-30 MED ORDER — DICLOFENAC SODIUM 75 MG PO TBEC
75.0000 mg | DELAYED_RELEASE_TABLET | Freq: Two times a day (BID) | ORAL | 1 refills | Status: DC
Start: 2017-05-30 — End: 2019-05-24

## 2017-05-30 NOTE — Patient Instructions (Signed)
Your pain is due to [facility] joint arthritis. These are the different medications you can take for this: Tylenol 57m 1-2 tabs three times a day for pain. Capsaicin, aspercreme, or biofreeze topically up to four times a day may also help with pain. Some supplements that may help for arthritis: Boswellia extract, curcumin, pycnogenol Voltaren 718mtwice a day with food for pain and inflammation. Cortisone injections are an option - let me know if you want to do this if not improving. Heat or ice 15 minutes at a time 3-4 times a day as needed to help with pain. Follow up with me in 5-6 weeks or as needed if you're doing well.

## 2017-06-01 ENCOUNTER — Encounter: Payer: Self-pay | Admitting: Family Medicine

## 2017-06-01 DIAGNOSIS — M25511 Pain in right shoulder: Secondary | ICD-10-CM | POA: Insufficient documentation

## 2017-06-01 HISTORY — DX: Pain in right shoulder: M25.511

## 2017-06-01 NOTE — Progress Notes (Addendum)
PCP: Ina Homes, MD  Subjective:   HPI: Patient is a 42 y.o. female here for right shoulder pain.  Patient reports for about 1-1/2 months she has had superior right shoulder pain. Pain is up to a 6 out of 10 in sore, tender. She is tried heat, stretching, ibuprofen 600 mg. Bothers her with sleeping and laying on the right side. No skin changes or numbness.  Past Medical History:  Diagnosis Date  . Arthritis    major joints  . Asthma    prn inhaler  . Carpal tunnel syndrome of right wrist 09/2012  . Complication of anesthesia    states is hard to wake up post-op  . Diabetes mellitus    intolerant to Metformin  . Dry skin    hands   . Family history of anesthesia complication    mother went into coma during c-section  . GERD (gastroesophageal reflux disease)   . Herpes genital   . Morbid obesity with BMI of 50 to 60 07/23/2009  . Obesity hypoventilation syndrome ([place]) 10/30/2012  . Obesity, morbid ([place]) 01/10/2014  . OSA on CPAP     AHI was on  11-17-12 to 120/hr, titrated to 15 cm with 3 cm EPR/   . PCOS (polycystic ovarian syndrome)   . Rheumatoid arthritis ([place])   . Seasonal allergies   . Sleep apnea, obstructive    uses CPAP nightly - had sleep study 08/22/2007    Current Outpatient Medications on File Prior to Visit  Medication Sig Dispense Refill  . ACCU-CHEK SMARTVIEW test strip TEST THREE TIMES DAILY 150 each 2  . acyclovir (ZOVIRAX) 400 MG tablet TAKE 1 TABLET BY MOUTH TWICE A DAY 60 tablet 0  . albuterol (PROAIR HFA) 108 (90 Base) MCG/ACT inhaler Inhale 2 puffs into the lungs every 4 (four) hours as needed for wheezing or shortness of breath (cough). 6.7 g 2  . atorvastatin (LIPITOR) 10 MG tablet TAKE 1 TABLET BY MOUTH EVERY DAY 90 tablet 1  . diclofenac sodium (VOLTAREN) 1 % GEL Apply 2 g topically 4 (four) times daily as needed. 100 g 0  . fluticasone (FLONASE) 50 MCG/ACT nasal spray Place 1 spray into both nostrils daily. 16 g 2  . Fluticasone-Salmeterol  (ADVAIR DISKUS) 250-50 MCG/DOSE AEPB Inhale 1 puff into the lungs 2 (two) times daily. 60 each 5  . glipiZIDE (GLUCOTROL) 10 MG tablet TAKE 1 TABLET (10 MG TOTAL) BY MOUTH 2 (TWO) TIMES DAILY BEFORE A MEAL. 60 tablet 3  . Insulin Glargine (LANTUS SOLOSTAR) 100 UNIT/ML Solostar Pen Inject 70 Units into the skin every morning. 15 mL 6  . Insulin Pen Needle (B-D UF III MINI PEN NEEDLES) 31G X 5 MM MISC USE AS DIRECTED TO INJECT INSULIN TWICE A DAY E11.65 100 each 3  . lactulose (CEPHULAC) 10 g packet Take 1 packet (10 g total) by mouth daily as needed. Reported on 07/29/2015 30 each 0  . Lancets (ACCU-CHEK SOFT TOUCH) lancets Use as instructed 100 each 12  . loratadine (CLARITIN) 10 MG tablet TAKE 1 TABLET (10 MG TOTAL) BY MOUTH 2 (TWO) TIMES DAILY. 60 tablet 1  . metFORMIN (GLUCOPHAGE-XR) 500 MG 24 hr tablet Take 1 tablet (500 mg total) by mouth at bedtime. 30 tablet 11  . omeprazole (PRILOSEC) 20 MG capsule TAKE 1 CAPSULE (20 MG TOTAL) BY MOUTH DAILY BEFORE BREAKFAST. 90 capsule 2  . Probiotic Product (PROBIOTIC DAILY PO) Take 2 capsules by mouth every evening.     . TURMERIC  PO Take 2 tablets by mouth at bedtime. Takes generic turmeric     No current facility-administered medications on file prior to visit.     Past Surgical History:  Procedure Laterality Date  . CARPAL TUNNEL RELEASE Right 10/02/2012   Procedure: RIGHT CARPAL TUNNEL RELEASE ENDOSCOPIC;  Surgeon: Jolyn Nap, MD;  Location: [place];  Service: Orthopedics;  Laterality: Right;  . CHOLECYSTECTOMY N/A 12/02/2014   Procedure: LAPAROSCOPIC CHOLECYSTECTOMY;  Surgeon: Armandina Gemma, MD;  Location: [facility];  Service: General;  Laterality: N/A;  . HYSTEROSCOPY W/D&C  01-08-2002    Allergies  Allergen Reactions  . Citrus Hives and Itching  . Latex Swelling  . Penicillins Hives and Itching  . Soap Hives, Itching and Swelling    PUREX LAUNDRY DETERGENT  . Tomato Hives and Itching  . Fluticasone     Per patient,  makes sneeze and gag  . Hydrocodone Itching and Nausea Only  . Other Itching    Chocolate, Bananas, Acidic foods  . Shellfish Allergy   . Doxycycline Other (See Comments)    AFFECTS JOINTS  . Metformin And Related Rash    GI UPSET    Social History   Socioeconomic History  . Marital status: Divorced    Spouse name: Not on file  . Number of children: 2  . Years of education: 50  . Highest education level: Not on file  Occupational History  . Occupation: UNEMPLOYED    Employer: UNEMPLOYED  Social Needs  . Financial resource strain: Not on file  . Food insecurity:    Worry: Not on file    Inability: Not on file  . Transportation needs:    Medical: Not on file    Non-medical: Not on file  Tobacco Use  . Smoking status: Former Smoker    Years: 10.00    Last attempt to quit: 05/24/2010    Years since quitting: 7.0  . Smokeless tobacco: Never Used  Substance and Sexual Activity  . Alcohol use: Yes    Alcohol/week: 0.0 oz    Comment: rarely  . Drug use: No  . Sexual activity: Not on file    Comment: Lives w/a partner  Lifestyle  . Physical activity:    Days per week: Not on file    Minutes per session: Not on file  . Stress: Not on file  Relationships  . Social connections:    Talks on phone: Not on file    Gets together: Not on file    Attends religious service: Not on file    Active member of club or organization: Not on file    Attends meetings of clubs or organizations: Not on file    Relationship status: Not on file  . Intimate partner violence:    Fear of current or ex partner: Not on file    Emotionally abused: Not on file    Physically abused: Not on file    Forced sexual activity: Not on file  Other Topics Concern  . Not on file  Social History Narrative   Single, but lives with a partner   Has 2 children and cares for an infant during the day          Family History  Problem Relation Age of Onset  . Other Mother   . Leukemia Cousin   . Other  Other   . Obesity Other   . Other Other   . Diabetes Maternal Aunt   . Heart attack Maternal  Aunt   . Alzheimer's disease Maternal Uncle   . Cancer Maternal Uncle        prostate  . Diabetes Paternal Aunt   . Cancer Paternal Aunt        brain,mouth  . Alzheimer's disease Paternal Aunt   . Diabetes Paternal Uncle   . Diabetes Maternal Grandmother   . Diabetes Maternal Grandfather     BP (!) 135/110   Pulse (!) 137   Ht 5' 2"  (1.575 m)   Wt (!) 320 lb (145.2 kg)   BMI 58.53 kg/m   Review of Systems: See HPI above.     Objective:  Physical Exam:  Gen: NAD, comfortable in exam room  Right shoulder: No swelling, ecchymoses.  No gross deformity. TTP AC joint. FROM with pain on flexion and abduction. Negative Hawkins, Neers. Negative Yergasons. Positive crossover adduction. Strength 5/5 with empty can and resisted internal/external rotation. Negative apprehension. NV intact distally.  Left shoulder: No swelling, ecchymoses.  No gross deformity. No TTP. FROM. Strength 5/5 with empty can and resisted internal/external rotation. NV intact distally.   Assessment & Plan:  1.  Right shoulder pain: Consistent with AC joint arthritis.  We discussed Tylenol, topical medications, supplements may help.  She will start Voltaren 75 mg twice a day with food.  We discussed AC joint injection she will consider this.  Heat or ice if needed.  Follow-up in 5-6 weeks.  Addendum:  Insurance would not approve voltaren - patient notified and would like to try rx ibuprofen instead - will send in.

## 2017-06-01 NOTE — Assessment & Plan Note (Signed)
Consistent with [facility] joint arthritis.  We discussed Tylenol, topical medications, supplements may help.  She will start Voltaren 75 mg twice a day with food.  We discussed AC joint injection she will consider this.  Heat or ice if needed.  Follow-up in 5-6 weeks.

## 2017-06-06 MED ORDER — IBUPROFEN 600 MG PO TABS: 600.0000 mg | ORAL_TABLET | Freq: Three times a day (TID) | ORAL | 1 refills | Status: DC | PRN

## 2017-06-06 NOTE — Addendum Note (Signed)
Addended by: Dene Gentry on: 06/06/2017 01:08 PM   Modules accepted: Orders

## 2017-07-08 ENCOUNTER — Other Ambulatory Visit: Payer: Self-pay | Admitting: *Deleted

## 2017-07-08 DIAGNOSIS — E1165 Type 2 diabetes mellitus with hyperglycemia: Secondary | ICD-10-CM

## 2017-07-08 DIAGNOSIS — Z794 Long term (current) use of insulin: Principal | ICD-10-CM

## 2017-07-08 MED ORDER — INSULIN GLARGINE 100 UNIT/ML SOLOSTAR PEN: 70.0000 [IU] | PEN_INJECTOR | Freq: Every morning | SUBCUTANEOUS | 6 refills | Status: DC

## 2017-07-11 ENCOUNTER — Ambulatory Visit: Payer: Medicaid Other | Admitting: Family Medicine

## 2017-07-21 ENCOUNTER — Ambulatory Visit: Payer: Medicaid Other | Admitting: Family Medicine

## 2017-07-21 ENCOUNTER — Encounter: Payer: Self-pay | Admitting: Family Medicine

## 2017-07-21 DIAGNOSIS — M7662 Achilles tendinitis, left leg: Secondary | ICD-10-CM

## 2017-07-21 DIAGNOSIS — M7661 Achilles tendinitis, right leg: Secondary | ICD-10-CM

## 2017-07-21 MED ORDER — NITROGLYCERIN 0.2 MG/HR TD PT24: MEDICATED_PATCH | TRANSDERMAL | 1 refills | Status: AC

## 2017-07-21 NOTE — Patient Instructions (Signed)
You have Achilles Tendinopathy Ibuprofen 635m three times a day with food OR aleve 2 tabs twice a day with food for pain and inflammation. Theraband strengthening with red band 3 sets of 10 once a day. When these are easy start Calf raises 3 sets of 10 on level ground once a day. When these are easy, can do them one legged 3 sets of 10. Finally advance to doing them on a step. Can add heel walks, toe walks forward and backward as well Ice bucket 10-15 minutes at end of day - can ice 3-4 times a day. Avoid uneven ground, hills as much as possible. Heel lifts in shoes or shoes with a natural heel lift. Nitro patches 1/4th patch to affected achilles, change daily.  After a week start putting it on both sides. Consider physical therapy, orthotics if not improving as expected. Follow up in 6 weeks.

## 2017-07-24 ENCOUNTER — Encounter: Payer: Self-pay | Admitting: Family Medicine

## 2017-07-24 DIAGNOSIS — M7661 Achilles tendinitis, right leg: Secondary | ICD-10-CM | POA: Insufficient documentation

## 2017-07-24 DIAGNOSIS — M7662 Achilles tendinitis, left leg: Principal | ICD-10-CM

## 2017-07-24 NOTE — Progress Notes (Signed)
PCP: Ina Homes, MD  Subjective:   HPI: Patient is a 42 y.o. female here for bilateral foot pain.  Patient reports she's had several weeks of bilateral posterior heel pain. Worse with walking. Pain is 6/10 and sharp, worse in left heel. Tried new shoes, ibuprofen 661m with mild benefit. Doing some simple motion exercises. + swelling but no other skin changes, numbness.  Past Medical History:  Diagnosis Date  . Arthritis    major joints  . Asthma    prn inhaler  . Carpal tunnel syndrome of right wrist 09/2012  . Complication of anesthesia    states is hard to wake up post-op  . Diabetes mellitus    intolerant to Metformin  . Dry skin    hands   . Family history of anesthesia complication    mother went into coma during c-section  . GERD (gastroesophageal reflux disease)   . Herpes genital   . Morbid obesity with BMI of 50 to 60 07/23/2009  . Obesity hypoventilation syndrome ([place] 10/30/2012  . Obesity, morbid ([place] 01/10/2014  . OSA on CPAP     AHI was on  11-17-12 to 120/hr, titrated to 15 cm with 3 cm EPR/   . PCOS (polycystic ovarian syndrome)   . Rheumatoid arthritis ([place]   . Seasonal allergies   . Sleep apnea, obstructive    uses CPAP nightly - had sleep study 08/22/2007    Current Outpatient Medications on File Prior to Visit  Medication Sig Dispense Refill  . ACCU-CHEK SMARTVIEW test strip TEST THREE TIMES DAILY 150 each 2  . acyclovir (ZOVIRAX) 400 MG tablet TAKE 1 TABLET BY MOUTH TWICE A DAY 60 tablet 0  . albuterol (PROAIR HFA) 108 (90 Base) MCG/ACT inhaler Inhale 2 puffs into the lungs every 4 (four) hours as needed for wheezing or shortness of breath (cough). 6.7 g 2  . atorvastatin (LIPITOR) 10 MG tablet TAKE 1 TABLET BY MOUTH EVERY DAY 90 tablet 1  . diclofenac (VOLTAREN) 75 MG EC tablet Take 1 tablet (75 mg total) by mouth 2 (two) times daily. 60 tablet 1  . diclofenac sodium (VOLTAREN) 1 % GEL Apply 2 g topically 4 (four) times daily as needed. 100 g 0   . fluticasone (FLONASE) 50 MCG/ACT nasal spray Place 1 spray into both nostrils daily. 16 g 2  . Fluticasone-Salmeterol (ADVAIR DISKUS) 250-50 MCG/DOSE AEPB Inhale 1 puff into the lungs 2 (two) times daily. 60 each 5  . glipiZIDE (GLUCOTROL) 10 MG tablet TAKE 1 TABLET (10 MG TOTAL) BY MOUTH 2 (TWO) TIMES DAILY BEFORE A MEAL. 60 tablet 3  . ibuprofen (ADVIL,MOTRIN) 600 MG tablet Take 1 tablet (600 mg total) by mouth every 8 (eight) hours as needed. 90 tablet 1  . Insulin Glargine (LANTUS SOLOSTAR) 100 UNIT/ML Solostar Pen Inject 70 Units into the skin every morning. 15 mL 6  . Insulin Pen Needle (B-D UF III MINI PEN NEEDLES) 31G X 5 MM MISC USE AS DIRECTED TO INJECT INSULIN TWICE A DAY E11.65 100 each 3  . lactulose (CEPHULAC) 10 g packet Take 1 packet (10 g total) by mouth daily as needed. Reported on 07/29/2015 30 each 0  . Lancets (ACCU-CHEK SOFT TOUCH) lancets Use as instructed 100 each 12  . loratadine (CLARITIN) 10 MG tablet TAKE 1 TABLET (10 MG TOTAL) BY MOUTH 2 (TWO) TIMES DAILY. 60 tablet 1  . metFORMIN (GLUCOPHAGE-XR) 500 MG 24 hr tablet Take 1 tablet (500 mg total) by mouth at bedtime. 3[place]  tablet 11  . omeprazole (PRILOSEC) 20 MG capsule TAKE 1 CAPSULE (20 MG TOTAL) BY MOUTH DAILY BEFORE BREAKFAST. 90 capsule 2  . Probiotic Product (PROBIOTIC DAILY PO) Take 2 capsules by mouth every evening.     . TURMERIC PO Take 2 tablets by mouth at bedtime. Takes generic turmeric     No current facility-administered medications on file prior to visit.     Past Surgical History:  Procedure Laterality Date  . CARPAL TUNNEL RELEASE Right 10/02/2012   Procedure: RIGHT CARPAL TUNNEL RELEASE ENDOSCOPIC;  Surgeon: Jolyn Nap, MD;  Location: [place];  Service: Orthopedics;  Laterality: Right;  . CHOLECYSTECTOMY N/A 12/02/2014   Procedure: LAPAROSCOPIC CHOLECYSTECTOMY;  Surgeon: Armandina Gemma, MD;  Location: [facility];  Service: General;  Laterality: N/A;  . HYSTEROSCOPY W/D&C  01-08-2002     Allergies  Allergen Reactions  . Citrus Hives and Itching  . Latex Swelling  . Penicillins Hives and Itching  . Soap Hives, Itching and Swelling    PUREX LAUNDRY DETERGENT  . Tomato Hives and Itching  . Fluticasone     Per patient, makes sneeze and gag  . Hydrocodone Itching and Nausea Only  . Other Itching    Chocolate, Bananas, Acidic foods  . Shellfish Allergy   . Doxycycline Other (See Comments)    AFFECTS JOINTS  . Metformin And Related Rash    GI UPSET    Social History   Socioeconomic History  . Marital status: Divorced    Spouse name: Not on file  . Number of children: 2  . Years of education: 73  . Highest education level: Not on file  Occupational History  . Occupation: UNEMPLOYED    Employer: UNEMPLOYED  Social Needs  . Financial resource strain: Not on file  . Food insecurity:    Worry: Not on file    Inability: Not on file  . Transportation needs:    Medical: Not on file    Non-medical: Not on file  Tobacco Use  . Smoking status: Former Smoker    Years: 10.00    Last attempt to quit: 05/24/2010    Years since quitting: 7.1  . Smokeless tobacco: Never Used  Substance and Sexual Activity  . Alcohol use: Yes    Alcohol/week: 0.0 oz    Comment: rarely  . Drug use: No  . Sexual activity: Not on file    Comment: Lives w/a partner  Lifestyle  . Physical activity:    Days per week: Not on file    Minutes per session: Not on file  . Stress: Not on file  Relationships  . Social connections:    Talks on phone: Not on file    Gets together: Not on file    Attends religious service: Not on file    Active member of club or organization: Not on file    Attends meetings of clubs or organizations: Not on file    Relationship status: Not on file  . Intimate partner violence:    Fear of current or ex partner: Not on file    Emotionally abused: Not on file    Physically abused: Not on file    Forced sexual activity: Not on file  Other Topics Concern   . Not on file  Social History Narrative   Single, but lives with a partner   Has 2 children and cares for an infant during the day          Family  History  Problem Relation Age of Onset  . Other Mother   . Leukemia Cousin   . Other Other   . Obesity Other   . Other Other   . Diabetes Maternal Aunt   . Heart attack Maternal Aunt   . Alzheimer's disease Maternal Uncle   . Cancer Maternal Uncle        prostate  . Diabetes Paternal Aunt   . Cancer Paternal Aunt        brain,mouth  . Alzheimer's disease Paternal Aunt   . Diabetes Paternal Uncle   . Diabetes Maternal Grandmother   . Diabetes Maternal Grandfather     BP (!) 151/87   Pulse 75   Ht 5' 2"  (1.575 m)   Wt (!) 310 lb (140.6 kg)   BMI 56.70 kg/m   Review of Systems: See HPI above.     Objective:  Physical Exam:  Gen: NAD, comfortable in exam room  Right foot/ankle: Pes planus.  No other gross deformity, ecchymoses.  1+ diffuse swelling. FROM with 5/5 strength but pain on full dorsiflexion and plantarflexion. TTP achilles at insertion on calcaneus. Negative calcaneal squeeze. Negative ant drawer and talar tilt.   Negative syndesmotic compression. Thompsons test negative. NV intact distally.  Left foot/ankle: Pes planus.  No other gross deformity, ecchymoses.  1+ diffuse swelling. FROM with 5/5 strength but pain on full dorsiflexion and plantarflexion. TTP achilles at insertion on calcaneus. Negative calcaneal squeeze. Negative ant drawer and talar tilt.   Negative syndesmotic compression. Thompsons test negative. NV intact distally.   Assessment & Plan:  1. Bilateral heel pain - 2/2 insertional achilles tendinitis.  Ibuprofen or aleve.  Shown home exercises and how to advance these.  Ice bucket.  Avoid uneven ground, hills.  Heel lifts.  Start nitro patches, change daily.  Consider physical therapy, orthotics.  F/u in 6 weeks.

## 2017-07-24 NOTE — Assessment & Plan Note (Signed)
2/2 insertional achilles tendinitis.  Ibuprofen or aleve.  Shown home exercises and how to advance these.  Ice bucket.  Avoid uneven ground, hills.  Heel lifts.  Start nitro patches, change daily.  Consider physical therapy, orthotics.  F/u in 6 weeks.

## 2017-08-18 ENCOUNTER — Encounter: Payer: Medicaid Other | Admitting: Internal Medicine

## 2017-08-31 ENCOUNTER — Other Ambulatory Visit: Payer: Self-pay | Admitting: Internal Medicine

## 2017-09-01 ENCOUNTER — Encounter: Payer: Self-pay | Admitting: Family Medicine

## 2017-09-01 ENCOUNTER — Ambulatory Visit: Payer: Medicaid Other | Admitting: Family Medicine

## 2017-09-01 VITALS — BP 106/66 | HR 98 | Ht 62.0 in | Wt 302.0 lb

## 2017-09-01 DIAGNOSIS — M25551 Pain in right hip: Secondary | ICD-10-CM

## 2017-09-01 DIAGNOSIS — M25511 Pain in right shoulder: Secondary | ICD-10-CM

## 2017-09-01 NOTE — Patient Instructions (Signed)
You have piriformis syndrome. Try to avoid painful activities when possible. Pick 2-3 stretches where you feel the pull in the area of pain - do 3 of these and hold for 20-30 seconds twice a day. Ibuprofen 677m three times a day with food OR aleve 2 tabs twice a day with food for pain and inflammation as needed. Tennis ball to massage area when sitting. Start physical therapy for this and your shoulder. Follow up with me in 6 weeks.  [facility], trapezius spasms/strain. These are the different medications you can take for this: Tylenol 5050m1-2 tabs three times a day for pain. Capsaicin, aspercreme, or biofreeze topically up to four times a day may also help with pain. Some supplements that may help for arthritis: Boswellia extract, curcumin, pycnogenol Ibuprofen as noted above. Cortisone injections are an option but I only think this would help with a little bit of your pain - I wouldn't do this right now. Heat or ice 15 minutes at a time 3-4 times a day as needed to help with pain.

## 2017-09-01 NOTE — Telephone Encounter (Signed)
Next appt scheduled 8/22 with PCP.

## 2017-09-01 NOTE — Telephone Encounter (Signed)
Needs appointment with [facility] to discuss why she is taking chronic Acyclovir.

## 2017-09-01 NOTE — Progress Notes (Signed)
PCP: Ina Homes, MD  Subjective:   HPI: Patient is a 42 y.o. female here for right shoulder pain, hip pain.  4/8: Patient reports for about 1-1/2 months she has had superior right shoulder pain. Pain is up to a 6 out of 10 in sore, tender. She is tried heat, stretching, ibuprofen 600 mg. Bothers her with sleeping and laying on the right side. No skin changes or numbness.  7/11: Patient returns reporting right shoulder still bothering her quite a bit at 7/10 level, mostly superiorly and posteriorly. Worse lying on right side. Also with new right posterior hip, buttock pain at 9/10 level. Difficult getting out of bed. Pain radiates down leg posteriorly. No back pain. Some numbness in same distribution. No bowel/bladder dysfunction.  Past Medical History:  Diagnosis Date  . Arthritis    major joints  . Asthma    prn inhaler  . Carpal tunnel syndrome of right wrist 09/2012  . Complication of anesthesia    states is hard to wake up post-op  . Diabetes mellitus    intolerant to Metformin  . Dry skin    hands   . Family history of anesthesia complication    mother went into coma during c-section  . GERD (gastroesophageal reflux disease)   . Herpes genital   . Morbid obesity with BMI of 50 to 60 07/23/2009  . Obesity hypoventilation syndrome ([place]) 10/30/2012  . Obesity, morbid ([place]) 01/10/2014  . OSA on CPAP     AHI was on  11-17-12 to 120/hr, titrated to 15 cm with 3 cm EPR/   . PCOS (polycystic ovarian syndrome)   . Rheumatoid arthritis ([place])   . Seasonal allergies   . Sleep apnea, obstructive    uses CPAP nightly - had sleep study 08/22/2007    Current Outpatient Medications on File Prior to Visit  Medication Sig Dispense Refill  . ACCU-CHEK SMARTVIEW test strip TEST THREE TIMES DAILY 150 each 2  . acyclovir (ZOVIRAX) 400 MG tablet TAKE 1 TABLET BY MOUTH TWICE A DAY 60 tablet 0  . albuterol (PROAIR HFA) 108 (90 Base) MCG/ACT inhaler Inhale 2 puffs into the lungs  every 4 (four) hours as needed for wheezing or shortness of breath (cough). 6.7 g 2  . atorvastatin (LIPITOR) 10 MG tablet TAKE 1 TABLET BY MOUTH EVERY DAY 90 tablet 1  . diclofenac (VOLTAREN) 75 MG EC tablet Take 1 tablet (75 mg total) by mouth 2 (two) times daily. 60 tablet 1  . diclofenac sodium (VOLTAREN) 1 % GEL Apply 2 g topically 4 (four) times daily as needed. 100 g 0  . fluticasone (FLONASE) 50 MCG/ACT nasal spray Place 1 spray into both nostrils daily. 16 g 2  . Fluticasone-Salmeterol (ADVAIR DISKUS) 250-50 MCG/DOSE AEPB Inhale 1 puff into the lungs 2 (two) times daily. 60 each 5  . glipiZIDE (GLUCOTROL) 10 MG tablet TAKE 1 TABLET (10 MG TOTAL) BY MOUTH 2 (TWO) TIMES DAILY BEFORE A MEAL. 60 tablet 3  . ibuprofen (ADVIL,MOTRIN) 600 MG tablet Take 1 tablet (600 mg total) by mouth every 8 (eight) hours as needed. 90 tablet 1  . Insulin Glargine (LANTUS SOLOSTAR) 100 UNIT/ML Solostar Pen Inject 70 Units into the skin every morning. 15 mL 6  . Insulin Pen Needle (B-D UF III MINI PEN NEEDLES) 31G X 5 MM MISC USE AS DIRECTED TO INJECT INSULIN TWICE A DAY E11.65 100 each 3  . lactulose (CEPHULAC) 10 g packet Take 1 packet (10 g total) by mouth  daily as needed. Reported on 07/29/2015 30 each 0  . Lancets (ACCU-CHEK SOFT TOUCH) lancets Use as instructed 100 each 12  . loratadine (CLARITIN) 10 MG tablet TAKE 1 TABLET (10 MG TOTAL) BY MOUTH 2 (TWO) TIMES DAILY. 60 tablet 1  . metFORMIN (GLUCOPHAGE-XR) 500 MG 24 hr tablet Take 1 tablet (500 mg total) by mouth at bedtime. 30 tablet 11  . nitroGLYCERIN (NITRODUR - DOSED IN MG/24 HR) 0.2 mg/hr patch Apply 1/4th patch to affected achilles, change daily 30 patch 1  . omeprazole (PRILOSEC) 20 MG capsule TAKE 1 CAPSULE (20 MG TOTAL) BY MOUTH DAILY BEFORE BREAKFAST. 90 capsule 2  . Probiotic Product (PROBIOTIC DAILY PO) Take 2 capsules by mouth every evening.     . TURMERIC PO Take 2 tablets by mouth at bedtime. Takes generic turmeric     No current  facility-administered medications on file prior to visit.     Past Surgical History:  Procedure Laterality Date  . CARPAL TUNNEL RELEASE Right 10/02/2012   Procedure: RIGHT CARPAL TUNNEL RELEASE ENDOSCOPIC;  Surgeon: Jolyn Nap, MD;  Location: [facility];  Service: Orthopedics;  Laterality: Right;  . CHOLECYSTECTOMY N/A 12/02/2014   Procedure: LAPAROSCOPIC CHOLECYSTECTOMY;  Surgeon: Armandina Gemma, MD;  Location: [facility];  Service: General;  Laterality: N/A;  . HYSTEROSCOPY W/D&C  01-08-2002    Allergies  Allergen Reactions  . Citrus Hives and Itching  . Latex Swelling  . Penicillins Hives and Itching  . Soap Hives, Itching and Swelling    PUREX LAUNDRY DETERGENT  . Tomato Hives and Itching  . Fluticasone     Per patient, makes sneeze and gag  . Hydrocodone Itching and Nausea Only  . Other Itching    Chocolate, Bananas, Acidic foods  . Shellfish Allergy   . Doxycycline Other (See Comments)    AFFECTS JOINTS  . Metformin And Related Rash    GI UPSET    Social History   Socioeconomic History  . Marital status: Divorced    Spouse name: Not on file  . Number of children: 2  . Years of education: 28  . Highest education level: Not on file  Occupational History  . Occupation: UNEMPLOYED    Employer: UNEMPLOYED  Social Needs  . Financial resource strain: Not on file  . Food insecurity:    Worry: Not on file    Inability: Not on file  . Transportation needs:    Medical: Not on file    Non-medical: Not on file  Tobacco Use  . Smoking status: Former Smoker    Years: 10.00    Last attempt to quit: 05/24/2010    Years since quitting: 7.2  . Smokeless tobacco: Never Used  Substance and Sexual Activity  . Alcohol use: Yes    Alcohol/week: 0.0 oz    Comment: rarely  . Drug use: No  . Sexual activity: Not on file    Comment: Lives w/a partner  Lifestyle  . Physical activity:    Days per week: Not on file    Minutes per session: Not on file  . Stress:  Not on file  Relationships  . Social connections:    Talks on phone: Not on file    Gets together: Not on file    Attends religious service: Not on file    Active member of club or organization: Not on file    Attends meetings of clubs or organizations: Not on file    Relationship status: Not on  file  . Intimate partner violence:    Fear of current or ex partner: Not on file    Emotionally abused: Not on file    Physically abused: Not on file    Forced sexual activity: Not on file  Other Topics Concern  . Not on file  Social History Narrative   Single, but lives with a partner   Has 2 children and cares for an infant during the day          Family History  Problem Relation Age of Onset  . Other Mother   . Leukemia Cousin   . Other Other   . Obesity Other   . Other Other   . Diabetes Maternal Aunt   . Heart attack Maternal Aunt   . Alzheimer's disease Maternal Uncle   . Cancer Maternal Uncle        prostate  . Diabetes Paternal Aunt   . Cancer Paternal Aunt        brain,mouth  . Alzheimer's disease Paternal Aunt   . Diabetes Paternal Uncle   . Diabetes Maternal Grandmother   . Diabetes Maternal Grandfather     BP 106/66   Pulse 98   Ht 5' 2"  (1.575 m)   Wt (!) 302 lb (137 kg)   BMI 55.24 kg/m   Review of Systems: See HPI above.     Objective:  Physical Exam:  Gen: NAD, comfortable in exam room  Right shoulder: No swelling, ecchymoses.  No gross deformity. TTP trapezius and AC joint.   FROM. Negative Hawkins, Neers. Negative Yergasons. Strength 5/5 with empty can and resisted internal/external rotation. Negative apprehension. NV intact distally.  Back/right hip: No gross deformity, scoliosis. TTP over piriformis and external rotators.  No midline or bony TTP. FROM. Strength LEs 5/5 all muscle groups including hip abduction.   2+ MSRs in patellar and achilles tendons, equal bilaterally. Sensation intact to light touch bilaterally. Negative  logroll Negative fabers Positive piriformis stretch.   Assessment & Plan:  1.  Right shoulder pain: 2/2 AC arthritis and trapezius strain/spasms.  Tylenol, topical medications, supplements reviewed.  Heat or ice.  Offered PT - will start this.  Consider injection.  2. Right hip pain - 2/2 piriformis syndrome.  Shown home exercises and stretches to do daily.  Start physical therapy.  Ibuprofen or aleve.  F/u in 6 weeks.

## 2017-09-02 ENCOUNTER — Other Ambulatory Visit: Payer: Self-pay | Admitting: *Deleted

## 2017-09-02 NOTE — Telephone Encounter (Signed)
Next appt scheduled 8/22 with PCP.

## 2017-09-04 ENCOUNTER — Encounter: Payer: Self-pay | Admitting: Family Medicine

## 2017-09-04 DIAGNOSIS — M25551 Pain in right hip: Secondary | ICD-10-CM

## 2017-09-04 HISTORY — DX: Pain in right hip: M25.551

## 2017-09-04 NOTE — Assessment & Plan Note (Signed)
2/2 AC arthritis and trapezius strain/spasms.  Tylenol, topical medications, supplements reviewed.  Heat or ice.  Offered PT - will start this.  Consider injection.

## 2017-09-04 NOTE — Assessment & Plan Note (Signed)
2/2 piriformis syndrome.  Shown home exercises and stretches to do daily.  Start physical therapy.  Ibuprofen or aleve.  F/u in 6 weeks.

## 2017-09-05 ENCOUNTER — Other Ambulatory Visit: Payer: Self-pay

## 2017-09-05 ENCOUNTER — Encounter: Payer: Self-pay | Admitting: Internal Medicine

## 2017-09-05 ENCOUNTER — Other Ambulatory Visit: Payer: Self-pay | Admitting: Internal Medicine

## 2017-09-05 ENCOUNTER — Ambulatory Visit: Payer: Medicaid Other | Admitting: Internal Medicine

## 2017-09-05 VITALS — BP 142/87 | HR 97 | Temp 97.8°F | Ht 62.0 in | Wt 309.2 lb

## 2017-09-05 DIAGNOSIS — K219 Gastro-esophageal reflux disease without esophagitis: Secondary | ICD-10-CM

## 2017-09-05 DIAGNOSIS — K5900 Constipation, unspecified: Secondary | ICD-10-CM

## 2017-09-05 DIAGNOSIS — R419 Unspecified symptoms and signs involving cognitive functions and awareness: Secondary | ICD-10-CM | POA: Diagnosis not present

## 2017-09-05 DIAGNOSIS — M549 Dorsalgia, unspecified: Secondary | ICD-10-CM | POA: Diagnosis not present

## 2017-09-05 DIAGNOSIS — Z7951 Long term (current) use of inhaled steroids: Secondary | ICD-10-CM

## 2017-09-05 DIAGNOSIS — E785 Hyperlipidemia, unspecified: Secondary | ICD-10-CM | POA: Diagnosis not present

## 2017-09-05 DIAGNOSIS — J45909 Unspecified asthma, uncomplicated: Secondary | ICD-10-CM | POA: Diagnosis not present

## 2017-09-05 DIAGNOSIS — Z79899 Other long term (current) drug therapy: Secondary | ICD-10-CM

## 2017-09-05 DIAGNOSIS — R450 Nervousness: Secondary | ICD-10-CM | POA: Diagnosis not present

## 2017-09-05 DIAGNOSIS — J4530 Mild persistent asthma, uncomplicated: Secondary | ICD-10-CM

## 2017-09-05 DIAGNOSIS — G4733 Obstructive sleep apnea (adult) (pediatric): Secondary | ICD-10-CM | POA: Diagnosis not present

## 2017-09-05 DIAGNOSIS — Z794 Long term (current) use of insulin: Secondary | ICD-10-CM

## 2017-09-05 DIAGNOSIS — E118 Type 2 diabetes mellitus with unspecified complications: Secondary | ICD-10-CM

## 2017-09-05 DIAGNOSIS — IMO0001 Reserved for inherently not codable concepts without codable children: Secondary | ICD-10-CM

## 2017-09-05 DIAGNOSIS — L298 Other pruritus: Secondary | ICD-10-CM

## 2017-09-05 DIAGNOSIS — E1165 Type 2 diabetes mellitus with hyperglycemia: Secondary | ICD-10-CM

## 2017-09-05 MED ORDER — ACYCLOVIR 400 MG PO TABS: 400.0000 mg | ORAL_TABLET | Freq: Two times a day (BID) | ORAL | 2 refills | Status: DC

## 2017-09-05 MED ORDER — INSULIN GLARGINE 100 UNIT/ML SOLOSTAR PEN: 70.0000 [IU] | PEN_INJECTOR | Freq: Every morning | SUBCUTANEOUS | 6 refills | Status: DC

## 2017-09-05 MED ORDER — GLIPIZIDE 10 MG PO TABS: 10.0000 mg | ORAL_TABLET | Freq: Two times a day (BID) | ORAL | 3 refills | Status: DC

## 2017-09-05 MED ORDER — LACTULOSE 10 G PO PACK: 10.0000 g | PACK | Freq: Every day | ORAL | 0 refills | Status: AC | PRN

## 2017-09-05 MED ORDER — FLUTICASONE PROPIONATE HFA 110 MCG/ACT IN AERO: 2.0000 | INHALATION_SPRAY | Freq: Two times a day (BID) | RESPIRATORY_TRACT | 0 refills | Status: DC

## 2017-09-05 MED ORDER — OMEPRAZOLE 20 MG PO CPDR: 20.0000 mg | DELAYED_RELEASE_CAPSULE | Freq: Every day | ORAL | 2 refills | Status: DC

## 2017-09-05 MED ORDER — INSULIN GLARGINE 100 UNIT/ML SOLOSTAR PEN: 73.0000 [IU] | PEN_INJECTOR | Freq: Every morning | SUBCUTANEOUS | 6 refills | Status: DC

## 2017-09-05 MED ORDER — GLUCOSE BLOOD VI STRP: ORAL_STRIP | 3 refills | Status: DC

## 2017-09-05 MED ORDER — OMEPRAZOLE 20 MG PO CPDR: 20.0000 mg | DELAYED_RELEASE_CAPSULE | Freq: Every day | ORAL | 2 refills | Status: AC

## 2017-09-05 MED ORDER — LACTULOSE 10 G PO PACK: 10.0000 g | PACK | Freq: Every day | ORAL | 0 refills | Status: DC | PRN

## 2017-09-05 NOTE — Progress Notes (Signed)
   CC: Medication refill for asthma and T2DM  HPI: AnaAna Long is a 42 y.o. F w/ PMH of T2DM, Asthma, GERD, HLD, OSA presenting to the clinic for follow up management of her asthma and T2DM. She states she has not had any issues with taking all of her medication as prescribed. Denies any episodes of low blood sugar. States she is using her Flovent 2 puffs twice a day and using her Qvar every couple days as needed. No acute complaints as of today. Refer to problem chased charting for details.  Past Medical History:  Diagnosis Date  . Arthritis    major joints  . Asthma    prn inhaler  . Carpal tunnel syndrome of right wrist 09/2012  . Complication of anesthesia    states is hard to wake up post-op  . Diabetes mellitus    intolerant to Metformin  . Dry skin    hands   . Family history of anesthesia complication    mother went into coma during c-section  . GERD (gastroesophageal reflux disease)   . Herpes genital   . Morbid obesity with BMI of 50 to 60 07/23/2009  . Obesity hypoventilation syndrome ([place]) 10/30/2012  . Obesity, morbid ([place]) 01/10/2014  . OSA on CPAP     AHI was on  11-17-12 to 120/hr, titrated to 15 cm with 3 cm EPR/   . PCOS (polycystic ovarian syndrome)   . Rheumatoid arthritis ([place])   . Seasonal allergies   . Sleep apnea, obstructive    uses CPAP nightly - had sleep study 08/22/2007    Review of Systems: Review of Systems  Constitutional: Negative for chills, fever, malaise/fatigue and weight loss.  Eyes: Positive for blurred vision and redness. Negative for discharge.       Itchy, teary eyes (states common for her during allergy season)  Respiratory: Positive for cough and wheezing. Negative for sputum production and shortness of breath.   Cardiovascular: Negative for chest pain, palpitations and leg swelling.  Gastrointestinal: Negative for constipation, diarrhea, nausea and vomiting.  Musculoskeletal: Positive for back pain and joint pain.  Negative for falls and myalgias.  Neurological: Negative for dizziness, sensory change, weakness and headaches.  Psychiatric/Behavioral: Negative for depression and suicidal ideas. The patient is nervous/anxious.     Physical Exam: Vitals:   09/05/17 1426  BP: (!) 142/87  Pulse: 97  Temp: 97.8 F (36.6 C)  SpO2: 97%    Physical Exam  Constitutional: She is oriented to person, place, and time. She appears well-developed and well-nourished. No distress.  Eyes: Pupils are equal, round, and reactive to light. Conjunctivae and EOM are normal. No scleral icterus.  Teary left eye with periorbital pruritus  Neck: Normal range of motion. Neck supple. No JVD present.  Cardiovascular: Normal rate, regular rhythm, normal heart sounds and intact distal pulses.  Respiratory: Effort normal and breath sounds normal. No respiratory distress. She has no wheezes.  Distant breath sounds 2/2 body habitus  GI: Soft. Bowel sounds are normal. She exhibits no distension. There is no tenderness. There is no guarding.  Lymphadenopathy:    She has no cervical adenopathy.  Neurological: She is alert and oriented to person, place, and time. She has normal reflexes.  Fine point sensation intact on bilateral lower extremities      Assessment & Plan:   See Encounters Tab for problem based charting.  Patient seen with Dr. Daryll Long   -Ana Long, PGY1

## 2017-09-05 NOTE — Patient Instructions (Addendum)
Hello Mrs.Diekmann  We have refilled your medication for you and advised you to stop taking the Qvar and start using your albuterol at nighttime when you feel short of breath instead. We will see you back in month for your regular check up. Thank you for visiting the clinic.

## 2017-09-05 NOTE — Assessment & Plan Note (Signed)
-   Continues to endorse heartburn esp with meals - Currently on Prilosec 21m  - Benign abdominal exam - Refill prilosec today - Counseled pt on smaller meals, avoiding lying down after meals, and other heartburn precautions

## 2017-09-05 NOTE — Assessment & Plan Note (Addendum)
-   Patient comes to clinic for management of diabetes mellitus type 2 - Last A1c 9.9 on 10/21/2016 - Currently on glipizide 41m tab BID, Lantus 73 units qhs. - States she stopped metformin because she was having too much diarrhea even when taking XR - States she has been sticking to her diabetic diet as well as she could - Refilled her glipizide and insulin - Patient states she knows she has her appt with Dr.Helberg on 8/22 and would like to defer blood tests until that appointment so no Hgb A1c today

## 2017-09-05 NOTE — Assessment & Plan Note (Signed)
-   Refer to T2DM w/ Hyperglycemia on encounters tab for details on her diabetes management

## 2017-09-05 NOTE — Assessment & Plan Note (Signed)
-   Patient states she has had constipation off and on for the last 10 years - States constipation especially worse when she takes ibuprofen, which she takes for period cramps - Physical exam: BS+, Soft, non-distended. Negative tenderness, guarding, rebound tenderness - Will refill her lactulose 46m packets today

## 2017-09-05 NOTE — Assessment & Plan Note (Addendum)
-   Pt states she has been managing her asthma with Flovent 2 puffs BID and Qvar at night time when having exacerbations - Pt states she only uses her albuterol inhaler only when her wheezing gets very bad - States she often has annual admission in [place] for asthma exacerbation but she is 'doing good this year' - Last PFT (10/31/2015)  show FEV1/FVC: 90%, TLC 81% - Physical exam: No wheezing, distant breath sounds most likely 2/2 body habitus - Having night time cough and wheezing 2~3 times per week  - Explained to the pt that she was prescribed Advair and her Flovent and Qvar was stopped but pt denies that her asthma regimen was changed. She states she does remember being on Advair in the past but has not taken it for years - Pt currently not on any LABA, on double dose of inhaled corticosteroids - Chart review show she was taken off Qvar for side effect intolerance but no evidence of being taken off Advair - Explained to the pt about the side effects of long term inhaled corticosteroid use - Pt is resistant to stop Flovent as it has been effective so far - Counseled pt to only do Flovent for now without Qvar with Albuterol inhaler use for exacerbations - Refilled 1 month dose of Flovent  - Follow up with PCP for re-assessment of asthma regimen, possibly replacing Flovent with Advair for Step 3 on Asthma Stepwise Therapy

## 2017-09-06 NOTE — Progress Notes (Signed)
Internal Medicine Clinic Attending  I saw and evaluated the patient.  I personally confirmed the key portions of the history and exam documented by Dr. Truman Hayward and I reviewed pertinent patient test results.  The assessment, diagnosis, and plan were formulated together and I agree with the documentation in the resident's note.  Patient is on an abnormal regimen for asthma.  I think this is due to misunderstanding of medication instructions in the past.  She would benefit from a LABA.  I will ask Dr. Maudie Mercury to review and discuss with her.

## 2017-09-09 ENCOUNTER — Other Ambulatory Visit: Payer: Self-pay | Admitting: Pharmacist

## 2017-09-09 DIAGNOSIS — J45909 Unspecified asthma, uncomplicated: Secondary | ICD-10-CM

## 2017-09-09 DIAGNOSIS — Z794 Long term (current) use of insulin: Principal | ICD-10-CM

## 2017-09-09 DIAGNOSIS — E1165 Type 2 diabetes mellitus with hyperglycemia: Principal | ICD-10-CM

## 2017-09-09 DIAGNOSIS — IMO0001 Reserved for inherently not codable concepts without codable children: Secondary | ICD-10-CM

## 2017-09-09 MED ORDER — MOMETASONE FURO-FORMOTEROL FUM 200-5 MCG/ACT IN AERO: 2.0000 | INHALATION_SPRAY | Freq: Two times a day (BID) | RESPIRATORY_TRACT | 1 refills | Status: DC

## 2017-09-09 NOTE — Progress Notes (Signed)
High dose ICS/LABA per Dr. Daryll Drown

## 2017-09-13 ENCOUNTER — Encounter: Payer: Self-pay | Admitting: Physical Therapy

## 2017-09-13 ENCOUNTER — Ambulatory Visit: Payer: Medicaid Other | Attending: Family Medicine | Admitting: Physical Therapy

## 2017-09-13 ENCOUNTER — Other Ambulatory Visit: Payer: Self-pay

## 2017-09-13 DIAGNOSIS — M6281 Muscle weakness (generalized): Secondary | ICD-10-CM

## 2017-09-13 DIAGNOSIS — M25611 Stiffness of right shoulder, not elsewhere classified: Secondary | ICD-10-CM | POA: Insufficient documentation

## 2017-09-13 DIAGNOSIS — M25511 Pain in right shoulder: Secondary | ICD-10-CM | POA: Insufficient documentation

## 2017-09-13 NOTE — Therapy (Signed)
[facility] [address]  [place], [place], [address] Phone: [phone]   Fax:  [phone]  Physical Therapy Evaluation  Patient Details  Name: Ana Long MRN: [id number] Date of Birth: [birth year] Referring Provider: Karlton Lemon, MD   Encounter Date: 09/13/2017  PT End of Session - 09/13/17 1657    Visit Number  1    Number of Visits  4    Date for PT Re-Evaluation  10/04/17    Authorization Type  Medicaid    PT Start Time  2707    PT Stop Time  1655    PT Time Calculation (min)  40 min    Activity Tolerance  Patient limited by pain;Patient tolerated treatment well    Behavior During Therapy  [facility] for tasks assessed/performed       Past Medical History:  Diagnosis Date  . Arthritis    major joints  . Asthma    prn inhaler  . Carpal tunnel syndrome of right wrist 09/2012  . Complication of anesthesia    states is hard to wake up post-op  . Diabetes mellitus    intolerant to Metformin  . Dry skin    hands   . Family history of anesthesia complication    mother went into coma during c-section  . GERD (gastroesophageal reflux disease)   . Herpes genital   . Morbid obesity with BMI of 50 to 60 07/23/2009  . Obesity hypoventilation syndrome ([place]) 10/30/2012  . Obesity, morbid ([place]) 01/10/2014  . OSA on CPAP     AHI was on  11-17-12 to 120/hr, titrated to 15 cm with 3 cm EPR/   . PCOS (polycystic ovarian syndrome)   . Rheumatoid arthritis ([place])   . Seasonal allergies   . Sleep apnea, obstructive    uses CPAP nightly - had sleep study 08/22/2007    Past Surgical History:  Procedure Laterality Date  . CARPAL TUNNEL RELEASE Right 10/02/2012   Procedure: RIGHT CARPAL TUNNEL RELEASE ENDOSCOPIC;  Surgeon: Jolyn Nap, MD;  Location: [place];  Service: Orthopedics;  Laterality: Right;  . CHOLECYSTECTOMY N/A 12/02/2014   Procedure: LAPAROSCOPIC CHOLECYSTECTOMY;  Surgeon: Armandina Gemma, MD;  Location[facility];  Service: General;  Laterality: N/A;  . HYSTEROSCOPY W/D&C  01-08-2002    There were no vitals filed for this visit.   Subjective Assessment - 09/13/17 1619    Subjective  Patient reports R shoulder pain started about 3-4 years ago but with fluctuating symptoms. Has gotten worse within the last 1.5 months. MD gave her exercises, cold and warm compress which helped for a while. MD does not recommend steroid shot. Does water aerobics for her heel, but this bothers R shoulder. Wakes her up at night; can't find a comfortable position anymore. Rainy weather makes it worse. Patient also having trouble mopping and lifting water bucket to clean after her puppies. Pain starts at top of R shoulder and radiates to posterior shoulder, with intermittent tingling to R hand.    Pertinent History  arthritis, asthma, R carpal tunnel release, DM, GERD, morbid obesity, RA, B achilles tendinitis     Limitations  House hold activities;Lifting    Diagnostic tests  none    Patient Stated Goals  want to sleep better and be able to lift the average amount of weight with R arm    Currently in Pain?  Yes    Pain Score  4  Pain Location  Shoulder    Pain Orientation  Right    Pain Descriptors / Indicators  Tingling    Pain Type  Chronic pain    Aggravating Factors   sleeping, mopping, lifting, reaching overhead, pressure of bra on shoulder    Pain Relieving Factors  Ibuprofen, sleeping on L side, hot tub jet         [facility] PT Assessment - 09/13/17 1627      Assessment   Medical Diagnosis  Acute pain of R shoulder    Referring Provider  Karlton Lemon, MD    Onset Date/Surgical Date  -- 1.5 months    Hand Dominance  Right    Next MD Visit  -- not scheduled    Prior Therapy  No      Precautions   Precautions  None      Restrictions   Weight Bearing Restrictions  No      Balance Screen   Has the patient fallen in the past 6 months  No    Has the patient had a decrease in  activity level because of a fear of falling?   No    Is the patient reluctant to leave their home because of a fear of falling?   No      Home Environment   Living Environment  Private residence    Living Arrangements  Children    Available Help at Discharge  Family able to somewhat help    Type of [place] to enter    Entrance Stairs-Number of Steps  1    Entrance Stairs-Rails  None    Home Layout  Two level    Alternate Level Stairs-Number of Steps  13    Alternate Level Stairs-Rails  None      Prior Function   Level of Independence  Independent    Vocation  Full time employment    Vocation Requirements  lifting, cleaning, cooking runs a Customer service manager   Overall Cognitive Status  Within Functional Limits for tasks assessed      Sensation   Light Touch  Appears Intact intermittent R hand tingling; hx of carpal tunnel release R      Coordination   Gross Motor Movements are Fluid and Coordinated  Yes      Posture/Postural Control   Posture/Postural Control  Postural limitations    Postural Limitations  Rounded Shoulders      ROM / Strength   AROM / PROM / Strength  AROM;PROM;Strength      AROM   AROM Assessment Site  Shoulder    Right/Left Shoulder  Left;Right    Right Shoulder Flexion  144 Degrees 8/10 pain    Right Shoulder ABduction  109 Degrees 7/10 pain    Right Shoulder Internal Rotation  35 Degrees    Right Shoulder External Rotation  85 Degrees    Left Shoulder Flexion  180 Degrees    Left Shoulder ABduction  174 Degrees    Left Shoulder Internal Rotation  58 Degrees    Left Shoulder External Rotation  85 Degrees      PROM   Overall PROM   -- Unable to tolerate PROM d/t pain    PROM Assessment Site  Shoulder      Strength   Strength Assessment Site  Shoulder    Right/Left Shoulder  Left;Right    Right Shoulder Flexion  3+/5  Right Shoulder ABduction  3+/5    Right Shoulder Internal Rotation  3+/5    Right  Shoulder External Rotation  3+/5    Left Shoulder Flexion  4+/5    Left Shoulder ABduction  4+/5    Left Shoulder Internal Rotation  4+/5    Left Shoulder External Rotation  4/5      Palpation   Palpation comment  TTP in R AC jt and slightly posterior to this area                Objective measurements completed on examination: See above findings.              PT Education - 09/13/17 1655    Education Details  prognosis, POC, HEP    Person(s) Educated  Patient    Methods  Explanation;Demonstration;Tactile cues;Verbal cues;Handout    Comprehension  Returned demonstration;Verbalized understanding       PT Short Term Goals - 09/13/17 1806      PT SHORT TERM GOAL #1   Title  Patient to be independent with initial HEP    Time  1    Period  Weeks    Status  New    Target Date  09/20/17        PT Long Term Goals - 09/13/17 1807      PT LONG TERM GOAL #1   Title  Patient to be independent with advanced HEP.    Time  3    Period  Weeks    Status  New    Target Date  10/04/17      PT LONG TERM GOAL #2   Title  Patient to demonstrate [facility] and pain-free R shoulder AROM.    Time  3    Period  Weeks    Status  New    Target Date  10/04/17      PT LONG TERM GOAL #3   Title  Patient to demonstrate >=4+/5 strength in R shoulder.    Time  3    Period  Weeks    Status  New    Target Date  10/04/17      PT LONG TERM GOAL #4   Title  Patient to demonstrate lifting 5# object from ground, placing on overhead shelf with R UE with <=2/10 pain.    Time  3    Period  Weeks    Status  New    Target Date  10/04/17      PT LONG TERM GOAL #5   Title  Patient to report tolerance of 1 hour of R sidelying without pain.    Time  3    Period  Weeks    Status  New    Target Date  10/04/17             Plan - 09/13/17 1804    Clinical Impression Statement  Patient is a 42y/o F presenting to OPPT with c/o exacerbation of R shoulder pain of 1.5 months duration.  Reports pain fluctuating for the past 3-4 years, now worse and interrupting sleep, causing pain with mopping, lifting, overhead reaching. Patient today with limited and painful AROM, decreased strength, and tenderness at [facility] joint. Unable to tolerate PROM d/t pain at this time. Patient received handout for gentle ROM and strengthening HEP. Unable to fully educate patient on HEP as patient took 2 phone calls during session and ran out of time. Will reassess at next session. Would benefit from skilled PT  services 1x/week for 3 weeks to address aforementioned impairments.     Clinical Presentation  Stable    Clinical Decision Making  Low    Rehab Potential  Good    Clinical Impairments Affecting Rehab Potential  arthritis, asthma, R carpal tunnel release, DM, GERD, morbid obesity, RA, B achilles tendinitis     PT Frequency  1x / week    PT Duration  3 weeks    PT Treatment/Interventions  ADLs/Self Care Home Management;Cryotherapy;Electrical Stimulation;Iontophoresis 90m/ml Dexamethasone;Moist Heat;Ultrasound;Therapeutic activities;Therapeutic exercise;Manual techniques;Patient/family education;Passive range of motion;Dry needling;Energy conservation;Splinting;Taping;Vasopneumatic Device    PT Next Visit Plan  reassess HEP    Consulted and Agree with Plan of Care  Patient       Patient will benefit from skilled therapeutic intervention in order to improve the following deficits and impairments:  Decreased activity tolerance, Decreased strength, Impaired UE functional use, Pain, Decreased range of motion, Postural dysfunction  Visit Diagnosis: Acute pain of right shoulder  Stiffness of right shoulder, not elsewhere classified  Muscle weakness (generalized)     Problem List Patient Active Problem List   Diagnosis Date Noted  . Uncontrolled type 2 diabetes mellitus without complication, with long-term current use of insulin ([place] 09/05/2017  . Right hip pain 09/04/2017  . Achilles tendinitis of  both lower extremities 07/24/2017  . Right shoulder pain 06/01/2017  . 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 Menorrhagia 11/06/2015  . Right ankle pain 06/19/2015  . Second degree uterine prolapse 02/12/2015  . Dyspareunia in female 01/24/2015  . Constipation 12/30/2014  . Urinary retention 12/09/2014  . Non-proliferative diabetic retinopathy ([place] 04/22/2014  . 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 Seasonal allergies 01/17/2013  . OSA on CPAP   . Low back pain 11/22/2012  . Obesity hypoventilation syndrome ([place] 10/30/2012  . Lower extremity edema 10/18/2012  . DUB (dysfunctional uterine bleeding) 03/01/2012  . Hypersomnia with sleep apnea   . Asthma 07/06/2011  . Osteoarthritis, knee 02/03/2010  . Morbid obesity with BMI of 50 to 60 07/23/2009  . Genital herpes 01/01/2006  . Uncontrolled type 2 diabetes mellitus with hyperglycemia, with long-term current use of insulin ([place] 01/01/2006  . Hyperlipidemia 01/01/2006  . GERD 01/01/2006    YJanene Harvey PT, DPT 09/13/17 6:10 PM   [facility] [address] [place] [address]Phone: [phone]  Fax:  [phone] Name: AFANTA WIMBERLEYMRN: [id number] of Birth: 110-29-1977

## 2017-09-20 ENCOUNTER — Ambulatory Visit: Payer: Medicaid Other

## 2017-09-20 DIAGNOSIS — M6281 Muscle weakness (generalized): Secondary | ICD-10-CM

## 2017-09-20 DIAGNOSIS — M25511 Pain in right shoulder: Secondary | ICD-10-CM

## 2017-09-20 DIAGNOSIS — M25611 Stiffness of right shoulder, not elsewhere classified: Secondary | ICD-10-CM

## 2017-09-20 NOTE — Therapy (Addendum)
[facility] [address]  [place], [place], [address] Phone: [phone]   Fax:  [phone]  Physical Therapy Treatment  Patient Details  Name: Ana Long MRN: [id number] Date of Birth: [birth year] Referring Provider: Karlton Lemon, MD   Encounter Date: 09/20/2017  PT End of Session - 09/20/17 1429    Visit Number  2    Number of Visits  4    Date for PT Re-Evaluation  10/04/17    Authorization Type  Medicaid    Authorization Time Period  7.30.19 - 8.19.19    Authorization - Visit Number  1    Authorization - Number of Visits  3    PT Start Time  9417    PT Stop Time  1527    PT Time Calculation (min)  46 min    Activity Tolerance  Patient tolerated treatment well    Behavior During Therapy  [facility] for tasks assessed/performed       Past Medical History:  Diagnosis Date  . Arthritis    major joints  . Asthma    prn inhaler  . Carpal tunnel syndrome of right wrist 09/2012  . Complication of anesthesia    states is hard to wake up post-op  . Diabetes mellitus    intolerant to Metformin  . Dry skin    hands   . Family history of anesthesia complication    mother went into coma during c-section  . GERD (gastroesophageal reflux disease)   . Herpes genital   . Morbid obesity with BMI of 50 to 60 07/23/2009  . Obesity hypoventilation syndrome ([place]) 10/30/2012  . Obesity, morbid ([place]) 01/10/2014  . OSA on CPAP     AHI was on  11-17-12 to 120/hr, titrated to 15 cm with 3 cm EPR/   . PCOS (polycystic ovarian syndrome)   . Rheumatoid arthritis ([place])   . Seasonal allergies   . Sleep apnea, obstructive    uses CPAP nightly - had sleep study 08/22/2007    Past Surgical History:  Procedure Laterality Date  . CARPAL TUNNEL RELEASE Right 10/02/2012   Procedure: RIGHT CARPAL TUNNEL RELEASE ENDOSCOPIC;  Surgeon: Jolyn Nap, MD;  Location: [place];  Service: Orthopedics;  Laterality:  Right;  . CHOLECYSTECTOMY N/A 12/02/2014   Procedure: LAPAROSCOPIC CHOLECYSTECTOMY;  Surgeon: Armandina Gemma, MD;  [facility];  Service: General;  Laterality: N/A;  . HYSTEROSCOPY W/D&C  01-08-2002    There were no vitals filed for this visit.  Subjective Assessment - 09/20/17 1429    Subjective  Pt. noting some difficulty with, "Going overhead HEP activity".      Pertinent History  arthritis, asthma, R carpal tunnel release, DM, GERD, morbid obesity, RA, B achilles tendinitis     Diagnostic tests  none    Patient Stated Goals  want to sleep better and be able to lift the average amount of weight with R arm    Currently in Pain?  Yes    Pain Score  4     Pain Location  Shoulder    Pain Orientation  Right    Pain Descriptors / Indicators  -- "Subtle"    Pain Type  Chronic pain    Aggravating Factors   sleeping on R side, mopping          [facility] PT Assessment - 09/20/17 1506      PROM   PROM Assessment Site  Shoulder  Right/Left Shoulder  Right    Right Shoulder Flexion  148 Degrees    Right Shoulder ABduction  132 Degrees    Right Shoulder Internal Rotation  85 Degrees    Right Shoulder External Rotation  55 Degrees                   [facility] Adult PT Treatment/Exercise - 09/20/17 1501      Shoulder Exercises: Supine   External Rotation  Right;AAROM;10 reps    External Rotation Limitations  wand  Heavy cueing required for proper motion     Flexion  Right;AAROM;10 reps    Flexion Limitations  wand  heavy cueing for proper motion     ABduction  Right;AAROM;10 reps    ABduction Limitations  elbow sliding along bolster in scap. plane      Shoulder Exercises: Seated   Flexion  AAROM;Right;10 reps    Flexion Limitations  5" with p-ball rollouts     Abduction  Right;AAROM;10 reps    ABduction Limitations  red p-ball rollouts  scaption     Other Seated Exercises  scapular retraction 5" x 10 reps       Shoulder Exercises: ROM/Strengthening   UBE (Upper Arm  Bike)  Lvl 1.0,m 3 min forwards    Pendulum  R shoulder pendulums all directions x 10 reps       Shoulder Exercises: Isometric Strengthening   External Rotation  -- 5" x 10 resp     Internal Rotation  -- 5" x 10 reps       Manual Therapy   Manual Therapy  Passive ROM    Manual therapy comments  STM to R UT, supraspinatus, R anterior shoulder in area of most tenderness     Passive ROM  R shoulder PROM all directions to pt. tolerance              PT Education - 09/21/17 1225    Education Details  HEP update     Person(s) Educated  Patient    Methods  Explanation;Demonstration;Verbal cues;Handout    Comprehension  Verbalized understanding;Returned demonstration;Verbal cues required;Need further instruction       PT Short Term Goals - 09/20/17 1431      PT SHORT TERM GOAL #1   Title  Patient to be independent with initial HEP    Time  1    Period  Weeks    Status  On-going        PT Long Term Goals - 09/20/17 1431      PT LONG TERM GOAL #1   Title  Patient to be independent with advanced HEP.    Time  3    Period  Weeks    Status  On-going      PT LONG TERM GOAL #2   Title  Patient to demonstrate [facility] and pain-free R shoulder AROM.    Time  3    Period  Weeks    Status  On-going      PT LONG TERM GOAL #3   Title  Patient to demonstrate >=4+/5 strength in R shoulder.    Time  3    Period  Weeks    Status  On-going      PT LONG TERM GOAL #4   Title  Patient to demonstrate lifting 5# object from ground, placing on overhead shelf with R UE with <=2/10 pain.    Time  3    Period  Weeks    Status  On-going      PT LONG TERM GOAL #5   Title  Patient to report tolerance of 1 hour of R sidelying without pain.    Time  3    Period  Weeks    Status  On-going            Plan - 09/20/17 1441    Clinical Impression Statement  Pt. reporting some difficulty with HEP and not sure if performing correctly.  Required mod correction with HEP review however able to  demo good technique following this.  Tolerated all ROM and AAROM activities in session well however some cueing to avoid painful arc of movement.  Ended session without increased pain from initial report thus modalities deferred.      Clinical Impairments Affecting Rehab Potential  arthritis, asthma, R carpal tunnel release, DM, GERD, morbid obesity, RA, B achilles tendinitis     PT Treatment/Interventions  ADLs/Self Care Home Management;Cryotherapy;Electrical Stimulation;Iontophoresis 15m/ml Dexamethasone;Moist Heat;Ultrasound;Therapeutic activities;Therapeutic exercise;Manual techniques;Patient/family education;Passive range of motion;Dry needling;Energy conservation;Splinting;Taping;Vasopneumatic Device    Consulted and Agree with Plan of Care  Patient       Patient will benefit from skilled therapeutic intervention in order to improve the following deficits and impairments:  Decreased activity tolerance, Decreased strength, Impaired UE functional use, Pain, Decreased range of motion, Postural dysfunction  Visit Diagnosis: Acute pain of right shoulder  Stiffness of right shoulder, not elsewhere classified  Muscle weakness (generalized)     Problem List Patient Active Problem List   Diagnosis Date Noted  . Uncontrolled type 2 diabetes mellitus without complication, with long-term current use of insulin ([place] 09/05/2017  . Right hip pain 09/04/2017  . Achilles tendinitis of both lower extremities 07/24/2017  . Right shoulder pain 06/01/2017  . 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 Menorrhagia 11/06/2015  . Right ankle pain 06/19/2015  . Second degree uterine prolapse 02/12/2015  . Dyspareunia in female 01/24/2015  . Constipation 12/30/2014  . Urinary retention 12/09/2014  . Non-proliferative diabetic retinopathy ([place] 04/22/2014  . 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 Seasonal allergies 01/17/2013  . OSA on CPAP   . Low back pain 11/22/2012  . Obesity hypoventilation  syndrome ([place] 10/30/2012  . Lower extremity edema 10/18/2012  . DUB (dysfunctional uterine bleeding) 03/01/2012  . Hypersomnia with sleep apnea   . Asthma 07/06/2011  . Osteoarthritis, knee 02/03/2010  . Morbid obesity with BMI of 50 to 60 07/23/2009  . Genital herpes 01/01/2006  . Uncontrolled type 2 diabetes mellitus with hyperglycemia, with long-term current use of insulin ([place] 01/01/2006  . Hyperlipidemia 01/01/2006  . GERD 01/01/2006    MBess Harvest PTA 09/21/17 12:25 PM   [facility] [address] [place] [address]Phone: [phone]  Fax:  [phone] Name: ACATILYN BOGGUSMRN: [id number] of Birth: [birth year] PHYSICAL THERAPY DISCHARGE SUMMARY  Visits from Start of Care: 2  Current functional level related to goals / functional outcomes: Unable to assess; patient did not return to PT before Medicaid authorization ran out   Remaining deficits: Unable to assess   Education / Equipment: HEP  Plan: Patient agrees to discharge.  Patient goals were not met. Patient is being discharged due to not returning since the last visit.  ?????     YJanene Harvey PT, DPT 10/26/17 9:13 AM

## 2017-09-21 ENCOUNTER — Other Ambulatory Visit: Payer: Self-pay | Admitting: Internal Medicine

## 2017-09-21 DIAGNOSIS — E1165 Type 2 diabetes mellitus with hyperglycemia: Secondary | ICD-10-CM

## 2017-09-21 DIAGNOSIS — Z794 Long term (current) use of insulin: Principal | ICD-10-CM

## 2017-09-21 MED ORDER — ATORVASTATIN CALCIUM 10 MG PO TABS: 10.0000 mg | ORAL_TABLET | Freq: Every day | ORAL | 1 refills | Status: DC

## 2017-09-21 NOTE — Progress Notes (Signed)
Sent out prescription refill for Atorvastatin

## 2017-09-27 ENCOUNTER — Ambulatory Visit: Payer: Medicaid Other | Attending: Family Medicine

## 2017-09-29 ENCOUNTER — Emergency Department (HOSPITAL_BASED_OUTPATIENT_CLINIC_OR_DEPARTMENT_OTHER): Payer: Medicaid Other

## 2017-09-29 ENCOUNTER — Emergency Department (HOSPITAL_BASED_OUTPATIENT_CLINIC_OR_DEPARTMENT_OTHER)
Admission: EM | Admit: 2017-09-29 | Discharge: 2017-09-29 | Disposition: A | Discharge status: Home / Self Care | Payer: Medicaid Other | Attending: Emergency Medicine | Admitting: Emergency Medicine

## 2017-09-29 ENCOUNTER — Encounter (HOSPITAL_BASED_OUTPATIENT_CLINIC_OR_DEPARTMENT_OTHER): Payer: Self-pay | Admitting: Emergency Medicine

## 2017-09-29 ENCOUNTER — Other Ambulatory Visit: Payer: Self-pay

## 2017-09-29 DIAGNOSIS — E119 Type 2 diabetes mellitus without complications: Secondary | ICD-10-CM | POA: Diagnosis not present

## 2017-09-29 DIAGNOSIS — N760 Acute vaginitis: Secondary | ICD-10-CM | POA: Diagnosis not present

## 2017-09-29 DIAGNOSIS — Z79899 Other long term (current) drug therapy: Secondary | ICD-10-CM | POA: Insufficient documentation

## 2017-09-29 DIAGNOSIS — Z87891 Personal history of nicotine dependence: Secondary | ICD-10-CM | POA: Insufficient documentation

## 2017-09-29 DIAGNOSIS — R05 Cough: Secondary | ICD-10-CM | POA: Diagnosis present

## 2017-09-29 DIAGNOSIS — J45909 Unspecified asthma, uncomplicated: Secondary | ICD-10-CM | POA: Insufficient documentation

## 2017-09-29 DIAGNOSIS — R059 Cough, unspecified: Secondary | ICD-10-CM

## 2017-09-29 DIAGNOSIS — B9689 Other specified bacterial agents as the cause of diseases classified elsewhere: Secondary | ICD-10-CM

## 2017-09-29 DIAGNOSIS — Z91040 Latex allergy status: Secondary | ICD-10-CM | POA: Diagnosis not present

## 2017-09-29 DIAGNOSIS — Z794 Long term (current) use of insulin: Secondary | ICD-10-CM | POA: Insufficient documentation

## 2017-09-29 DIAGNOSIS — R062 Wheezing: Secondary | ICD-10-CM

## 2017-09-29 DIAGNOSIS — L739 Follicular disorder, unspecified: Secondary | ICD-10-CM | POA: Insufficient documentation

## 2017-09-29 LAB — PREGNANCY, URINE: PREG TEST UR: NEGATIVE

## 2017-09-29 LAB — WET PREP, GENITAL
Sperm: NONE SEEN
Trich, Wet Prep: NONE SEEN
YEAST WET PREP: NONE SEEN

## 2017-09-29 MED ORDER — IPRATROPIUM-ALBUTEROL 0.5-2.5 (3) MG/3ML IN SOLN
3.0000 mL | Freq: Four times a day (QID) | RESPIRATORY_TRACT | Status: DC
  Administered 2017-09-29: 3 mL via RESPIRATORY_TRACT
  Filled 2017-09-29: qty 3

## 2017-09-29 MED ORDER — BENZONATATE 100 MG PO CAPS: 100.0000 mg | ORAL_CAPSULE | Freq: Three times a day (TID) | ORAL | 0 refills | Status: DC

## 2017-09-29 MED ORDER — METRONIDAZOLE 500 MG PO TABS: 500.0000 mg | ORAL_TABLET | Freq: Two times a day (BID) | ORAL | 0 refills | Status: AC

## 2017-09-29 NOTE — ED Provider Notes (Signed)
[facility] EMERGENCY DEPARTMENT Provider Note   CSN: [id number] Arrival date & time: 09/29/17  0715     History   Chief Complaint No chief complaint on file.   HPI Ana Long is a 42 y.o. female.  The history is provided by the patient and medical records. No language interpreter was used.  Cough  This is a new problem. The current episode started more than 1 week ago. The problem occurs constantly. The problem has not changed since onset.The cough is productive of sputum. There has been no fever. Associated symptoms include shortness of breath and wheezing. Pertinent negatives include no chest pain, no chills, no sweats, no rhinorrhea, no sore throat and no myalgias. She has tried nothing for the symptoms. The treatment provided no relief. She is not a smoker. Her past medical history is significant for pneumonia (hx of) and asthma.    Past Medical History:  Diagnosis Date  . Arthritis    major joints  . Asthma    prn inhaler  . Carpal tunnel syndrome of right wrist 09/2012  . Complication of anesthesia    states is hard to wake up post-op  . Diabetes mellitus    intolerant to Metformin  . Dry skin    hands   . Family history of anesthesia complication    mother went into coma during c-section  . GERD (gastroesophageal reflux disease)   . Herpes genital   . Morbid obesity with BMI of 50 to 60 07/23/2009  . Obesity hypoventilation syndrome ([place]) 10/30/2012  . Obesity, morbid ([place]) 01/10/2014  . OSA on CPAP     AHI was on  11-17-12 to 120/hr, titrated to 15 cm with 3 cm EPR/   . PCOS (polycystic ovarian syndrome)   . Rheumatoid arthritis ([place])   . Seasonal allergies   . Sleep apnea, obstructive    uses CPAP nightly - had sleep study 08/22/2007    Patient Active Problem List   Diagnosis Date Noted  . Uncontrolled type 2 diabetes mellitus without complication, with long-term current use of insulin ([place]) 09/05/2017  . Right hip pain 09/04/2017  .  Achilles tendinitis of both lower extremities 07/24/2017  . Right shoulder pain 06/01/2017  . 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 Menorrhagia 11/06/2015  . Right ankle pain 06/19/2015  . Second degree uterine prolapse 02/12/2015  . Dyspareunia in female 01/24/2015  . Constipation 12/30/2014  . Urinary retention 12/09/2014  . Non-proliferative diabetic retinopathy ([place]) 04/22/2014  . 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 Seasonal allergies 01/17/2013  . OSA on CPAP   . Low back pain 11/22/2012  . Obesity hypoventilation syndrome ([place]) 10/30/2012  . Lower extremity edema 10/18/2012  . DUB (dysfunctional uterine bleeding) 03/01/2012  . Hypersomnia with sleep apnea   . Asthma 07/06/2011  . Osteoarthritis, knee 02/03/2010  . Morbid obesity with BMI of 50 to 60 07/23/2009  . Genital herpes 01/01/2006  . Uncontrolled type 2 diabetes mellitus with hyperglycemia, with long-term current use of insulin ([place]) 01/01/2006  . Hyperlipidemia 01/01/2006  . GERD 01/01/2006    Past Surgical History:  Procedure Laterality Date  . CARPAL TUNNEL RELEASE Right 10/02/2012   Procedure: RIGHT CARPAL TUNNEL RELEASE ENDOSCOPIC;  Surgeon: Jolyn Nap, MD;  Location: [facility];  Service: Orthopedics;  Laterality: Right;  . CHOLECYSTECTOMY N/A 12/02/2014   Procedure: LAPAROSCOPIC CHOLECYSTECTOMY;  Surgeon: Armandina Gemma, MD;  Location: [facility];  Service: General;  Laterality: N/A;  . HYSTEROSCOPY W/D&C  01-08-2002     OB History    Gravida  0   Para      Term      Preterm      AB      Living        SAB      TAB      Ectopic      Multiple      Live Births               Home Medications    Prior to Admission medications   Medication Sig Start Date End Date Taking? Authorizing Provider  acyclovir (ZOVIRAX) 400 MG tablet Take 1 tablet (400 mg total) by mouth 2 (two) times daily. 09/05/17   Sid Falcon, MD  albuterol (PROAIR HFA) [phone] Base) MCG/ACT  inhaler Inhale 2 puffs into the lungs every 4 (four) hours as needed for wheezing or shortness of breath (cough). 05/24/16   Holley Raring, MD  atorvastatin (LIPITOR) 10 MG tablet Take 1 tablet (10 mg total) by mouth daily. 09/21/17   Ina Homes, MD  diclofenac (VOLTAREN) 75 MG EC tablet Take 1 tablet (75 mg total) by mouth 2 (two) times daily. 05/30/17   Hudnall, Sharyn Lull, MD  diclofenac sodium (VOLTAREN) 1 % GEL Apply 2 g topically 4 (four) times daily as needed. 07/29/15   Francesca Oman, DO  glipiZIDE (GLUCOTROL) 10 MG tablet Take 1 tablet (10 mg total) by mouth 2 (two) times daily before a meal. 09/05/17   Sid Falcon, MD  glucose blood (ACCU-CHEK SMARTVIEW) test strip TEST THREE TIMES DAILY 09/05/17   Ina Homes, MD  ibuprofen (ADVIL,MOTRIN) 600 MG tablet Take 1 tablet (600 mg total) by mouth every 8 (eight) hours as needed. 06/06/17   Hudnall, Sharyn Lull, MD  Insulin Glargine (LANTUS SOLOSTAR) 100 UNIT/ML Solostar Pen Inject 73 Units into the skin every morning. 09/05/17   Sid Falcon, MD  Insulin Pen Needle (B-D UF III MINI PEN NEEDLES) 31G X 5 MM MISC USE AS DIRECTED TO INJECT INSULIN TWICE A DAY E11.65 09/01/17   Ina Homes, MD  lactulose (CEPHULAC) 10 g packet Take 1 packet (10 g total) by mouth daily as needed. Reported on 07/29/2015 09/05/17   Sid Falcon, MD  Lancets (ACCU-CHEK SOFT [facility]) lancets Use as instructed 10/12/16   Ina Homes, MD  loratadine (CLARITIN) 10 MG tablet TAKE 1 TABLET (10 MG TOTAL) BY MOUTH 2 (TWO) TIMES DAILY. 12/01/15   Holley Raring, MD  mometasone-formoterol (DULERA) 200-5 MCG/ACT AERO Inhale 2 puffs into the lungs 2 (two) times daily. RINSE MOUTH AFTER EACH USE 09/09/17   Sid Falcon, MD  nitroGLYCERIN (NITRODUR - DOSED IN MG/24 HR) 0.2 mg/hr patch Apply 1/4th patch to affected achilles, change daily 07/21/17   Hudnall, Sharyn Lull, MD  omeprazole (PRILOSEC) 20 MG capsule Take 1 capsule (20 mg total) by mouth daily before breakfast. 09/05/17   Sid Falcon, MD  Probiotic Product (PROBIOTIC DAILY PO) Take 2 capsules by mouth every evening.     [provider]  TURMERIC PO Take 2 tablets by mouth at bedtime. Takes generic turmeric    [provider]    Family History Family History  Problem Relation Age of Onset  . Other Mother   . Leukemia Cousin   . Other Other   . Obesity Other   . Other Other   . Diabetes Maternal Aunt   . Heart attack Maternal Aunt   .  Alzheimer's disease Maternal Uncle   . Cancer Maternal Uncle        prostate  . Diabetes Paternal Aunt   . Cancer Paternal Aunt        brain,mouth  . Alzheimer's disease Paternal Aunt   . Diabetes Paternal Uncle   . Diabetes Maternal Grandmother   . Diabetes Maternal Grandfather     Social History Social History   Tobacco Use  . Smoking status: Former Smoker    Years: 10.00    Last attempt to quit: 05/24/2010    Years since quitting: 7.3  . Smokeless tobacco: Never Used  Substance Use Topics  . Alcohol use: Yes    Alcohol/week: 0.0 standard drinks    Comment: rarely  . Drug use: No     Allergies   Citrus; Latex; Penicillins; Soap; Tomato; Fluticasone; Hydrocodone; Other; Shellfish allergy; Doxycycline; and Metformin and related   Review of Systems Review of Systems  Constitutional: Negative for chills, diaphoresis, fatigue and fever.  HENT: Negative for congestion, rhinorrhea and sore throat.   Respiratory: Positive for cough, shortness of breath and wheezing. Negative for choking and chest tightness.   Cardiovascular: Negative for chest pain.  Gastrointestinal: Negative for abdominal pain, constipation, diarrhea, nausea and vomiting.  Genitourinary: Positive for genital sores and vaginal pain. Negative for dysuria, flank pain, frequency, pelvic pain, vaginal bleeding and vaginal discharge.  Musculoskeletal: Negative for back pain, myalgias, neck pain and neck stiffness.  Skin: Negative for rash and wound.  Neurological: Negative for  light-headedness.  Psychiatric/Behavioral: Negative for agitation.     Physical Exam Updated Vital Signs There were no vitals taken for this visit.  Physical Exam  Constitutional: She appears well-developed and well-nourished. No distress.  HENT:  Head: Normocephalic and atraumatic.  Mouth/Throat: Oropharynx is clear and moist.  Eyes: Pupils are equal, round, and reactive to light. Conjunctivae are normal.  Neck: Neck supple.  Cardiovascular: Normal rate and regular rhythm.  No murmur heard. Pulmonary/Chest: Effort normal. No respiratory distress. She has wheezes. She has no rales. She exhibits no tenderness.  Abdominal: Soft. There is no tenderness.  Genitourinary:    Pelvic exam was performed with patient supine. Uterus is not tender. Cervix exhibits no motion tenderness, no discharge and no friability. Right adnexum displays no tenderness. Left adnexum displays no tenderness. No tenderness or bleeding in the vagina. No foreign body in the vagina. No vaginal discharge found.  Musculoskeletal: She exhibits no edema.  Neurological: She is alert. No sensory deficit. She exhibits normal muscle tone.  Skin: Skin is warm and dry. Capillary refill takes less than 2 seconds. No rash noted. She is not diaphoretic. No erythema.  Psychiatric: She has a normal mood and affect.  Nursing note and vitals reviewed.    ED Treatments / Results  Labs (all labs ordered are listed, but only abnormal results are displayed) Labs Reviewed  WET PREP, GENITAL - Abnormal; Notable for the following components:      Result Value   Clue Cells Wet Prep HPF POC PRESENT (*)    WBC, Wet Prep HPF POC MANY (*)    All other components within normal limits  PREGNANCY, URINE  GC/CHLAMYDIA PROBE AMP ([place]) NOT AT [facility]    EKG None  Radiology Dg Chest 2 View  Result Date: 09/29/2017 CLINICAL DATA:  Acute shortness of breath and cough with wheezing for 2 weeks. EXAM: CHEST - 2 VIEW COMPARISON:   07/31/2015 and prior radiographs FINDINGS: The cardiomediastinal silhouette is unremarkable. Mild  peribronchial thickening is unchanged. There is no evidence of focal airspace disease, pulmonary edema, suspicious pulmonary nodule/mass, pleural effusion, or pneumothorax. No acute bony abnormalities are identified. IMPRESSION: 1. No evidence of acute abnormality. 2. Mild chronic peribronchial thickening. Electronically Signed   By: Margarette Canada M.D.   On: 09/29/2017 09:05    Procedures Procedures (including critical care time)  Medications Ordered in ED Medications  ipratropium-albuterol (DUONEB) 0.5-2.5 (3) MG/3ML nebulizer solution 3 mL (3 mLs Nebulization Given 09/29/17 0820)     Initial Impression / Assessment and Plan / ED Course  I have reviewed the triage vital signs and the nursing notes.  Pertinent labs & imaging results that were available during my care of the patient were reviewed by me and considered in my medical decision making (see chart for details).     Ana Long is a 41 y.o. female with a past medical history significant for asthma, hyperlipidemia, diabetes, morbid obesity, GERD, genital herpes, and sleep apnea who presents with wheezing, cough, and vaginal irritation.  Patient reports that for the last week she has had some cough with a clear sputum and wheezing.  She reports that she has used inhalers at times but she does not like using them.  She is also concerned because over the last several days she is developed a bump on her external vaginal area causing some pain.  She reports it does not seem similar to prior herpes that she has had.  She does report a recent new sexual partner but denies any vaginal discharge, odor, or bleeding.  She reports originally finished her menstrual cycle and does not feel she is pregnant.  She denies any abdominal pain back pain chest pain or fevers or chills.  She denies any other complaints and no trauma.  Patient does report  that she recent had a vaginal waxing performed and thinks that may have irritated things with no new soap.  On exam, patient had a small bump on her left labia majora that was slightly tender.  There was no evidence of abscess or cyst.  No findings able to be drained.  Suspect a mild folliculitis from the patient's waxing and soap use.  No evidence of herpetic lesion seen.  Pelvic exam was performed showing no discharge, cervical motion tenderness, or adnexal tenderness.  No bleeding or laceration seen.  Unremarkable GU exam aside from the palpable tender bump on the left side.  Lungs were wheezing in all lung fields and chest was nontender.  Abdomen was nontender.  No murmur appreciated.  Patient will have wet prep and GC chlamydia sent.  Patient also have a DuoNeb breathing treatment and a chest x-ray to look for pneumonia.  Patient reports she has yearly pneumonias.  Anticipate reassessment after work-up.  10:54 AM Patient's breathing somewhat improved after the albuterol.  I heard no further wheezing on her lung exam.  Next  X-ray shows no pneumonia.  Peribronchial thickening was seen consistent with her asthma.  Wet prep showed clue cells.  Patient was treated for bacterial vaginosis.  Pregnancy test was negative.  Patient will follow-up with her PCP.  She will be given prescription for Tessalon for her cough and continue her outpatient breathing treatments.  She will also be given prescription for Flagyl for BV.  Next  Patient discharged in good condition with improving symptoms.  Final Clinical Impressions(s) / ED Diagnoses   Final diagnoses:  Cough  Wheeze  Bacterial vaginosis  Folliculitis    ED Discharge  Orders         Ordered    benzonatate (TESSALON) 100 MG capsule  Every 8 hours     09/29/17 1055    metroNIDAZOLE (FLAGYL) 500 MG tablet  2 times daily     09/29/17 1055          Clinical Impression: 1. Cough   2. Wheeze   3. Bacterial vaginosis   4. Folliculitis      Disposition: Discharge  Condition: Good  I have discussed the results, Dx and Tx plan with the pt(& family if present). He/she/they expressed understanding and agree(s) with the plan. Discharge instructions discussed at great length. Strict return precautions discussed and pt &/or family have verbalized understanding of the instructions. No further questions at time of discharge.    New Prescriptions   BENZONATATE (TESSALON) 100 MG CAPSULE    Take 1 capsule (100 mg total) by mouth every 8 (eight) hours.   METRONIDAZOLE (FLAGYL) 500 MG TABLET    Take 1 tablet (500 mg total) by mouth 2 (two) times daily for 7 days.    Follow Up: [place] [id number] [phone] Schedule an appointment as soon as possible for a visit    [place] [address] [id number] [place] [facility] [phone]       Tegeler, Gwenyth Allegra, MD 09/29/17 1058

## 2017-09-29 NOTE — ED Notes (Signed)
ED Provider at bedside. 

## 2017-09-29 NOTE — ED Triage Notes (Signed)
Pt c/o cough x 2 weeks with hx of asthma. Also reports vaginal itching x 1 week and a painful bump to the vaginal area. Denies discharge.

## 2017-09-29 NOTE — Discharge Instructions (Signed)
Your work-up today did not show evidence of pneumonia however your wheezing improved with albuterol.  Please continue using your breathing treatments at home and use the Tessalon to help with your cough as it helped in the past.  Please use the Flagyl to treat the bacterial vaginosis we discovered.  We suspect you have a mild folliculitis on the left side of your groin causing the discomfort.  Please use warm compresses to help with the area of irritation.  If any symptoms change or worsen, please return to the nearest emergency department.  Please follow-up with your PCP.

## 2017-09-29 NOTE — ED Notes (Signed)
NAD at this time. Pt is stable and going home.  

## 2017-09-30 LAB — GC/CHLAMYDIA PROBE AMP (~~LOC~~) NOT AT ARMC
CHLAMYDIA, DNA PROBE: NEGATIVE
NEISSERIA GONORRHEA: NEGATIVE

## 2017-10-04 ENCOUNTER — Ambulatory Visit: Payer: Medicaid Other | Admitting: Physical Therapy

## 2017-10-10 ENCOUNTER — Ambulatory Visit: Payer: Medicaid Other | Admitting: Physical Therapy

## 2017-10-13 ENCOUNTER — Ambulatory Visit: Payer: Medicaid Other | Admitting: Internal Medicine

## 2017-10-13 ENCOUNTER — Other Ambulatory Visit: Payer: Self-pay

## 2017-10-13 ENCOUNTER — Encounter: Payer: Self-pay | Admitting: Internal Medicine

## 2017-10-13 VITALS — BP 107/54 | HR 105 | Temp 98.2°F | Ht 62.0 in | Wt 301.7 lb

## 2017-10-13 DIAGNOSIS — E1165 Type 2 diabetes mellitus with hyperglycemia: Secondary | ICD-10-CM | POA: Diagnosis present

## 2017-10-13 DIAGNOSIS — F330 Major depressive disorder, recurrent, mild: Secondary | ICD-10-CM | POA: Insufficient documentation

## 2017-10-13 DIAGNOSIS — Z794 Long term (current) use of insulin: Secondary | ICD-10-CM | POA: Diagnosis not present

## 2017-10-13 LAB — POCT GLYCOSYLATED HEMOGLOBIN (HGB A1C): Hemoglobin A1C: 11.5 % — AB (ref 4.0–5.6)

## 2017-10-13 LAB — GLUCOSE, CAPILLARY: GLUCOSE-CAPILLARY: 353 mg/dL — AB (ref 70–99)

## 2017-10-13 MED ORDER — SITAGLIPTIN PHOSPHATE 100 MG PO TABS: 100.0000 mg | ORAL_TABLET | Freq: Every day | ORAL | 3 refills | Status: DC

## 2017-10-13 MED ORDER — ACCU-CHEK SOFT TOUCH LANCETS MISC: 12 refills | Status: DC

## 2017-10-13 NOTE — Progress Notes (Signed)
Medicine attending: Medical history, presenting problems, physical findings, and medications, reviewed with resident physician Dr Ina Homes on the day of the patient visit and I concur with his evaluation and management plan.

## 2017-10-13 NOTE — Assessment & Plan Note (Signed)
A1c 11.9 today, up from 9.9. She brought in her recent CBG readings that showed an average of 288 with highs in the 400s. She does not follow a diet and drinks a lot of soda. She has not been exercising. She takes Lantus 75 units QHS and glipizide 10 mg QD. She stopped metformin due to GI side effects. Her weight is down 20 lbs in 4 months.   We discussed the need for better glycemic control and medical options. We discussed adding a GLP-1 or Xultophy but she declined and would prefer an oral agent. We will add sitagliptin (DPP-4 inhibitor). She will follow-up in 1 month and we will add an SLGT-2 inhibitor. Ultimately she needs meal time insulin but she declines.   Plan: - Encourage lifestyle changes and avoiding sugary beverages - Continue Lantus 75 units QHS  - Continue Glipizide 10 mg QD - START Sitagliptin 100 mg QD

## 2017-10-13 NOTE — Assessment & Plan Note (Signed)
Patient with recurrent MDD and requesting referral to a psychiatry. She denies SI or HI. She is not wanting to start any medications and would prefer to see psychiatry.

## 2017-10-13 NOTE — Progress Notes (Signed)
   CC: DM  HPI:  [name] is a 42 y.o. female who presented to the clinic for continued evaluation and management of her chronic medical illnesses. For a detailed assessment and plan please refer to problem based charting below.   Past Medical History:  Diagnosis Date  . Arthritis    major joints  . Asthma    prn inhaler  . Carpal tunnel syndrome of right wrist 09/2012  . Complication of anesthesia    states is hard to wake up post-op  . Diabetes mellitus    intolerant to Metformin  . Dry skin    hands   . Family history of anesthesia complication    mother went into coma during c-section  . GERD (gastroesophageal reflux disease)   . Herpes genital   . Morbid obesity with BMI of 50 to 60 07/23/2009  . Obesity hypoventilation syndrome ([place]) 10/30/2012  . Obesity, morbid ([place]) 01/10/2014  . OSA on CPAP     AHI was on  11-17-12 to 120/hr, titrated to 15 cm with 3 cm EPR/   . PCOS (polycystic ovarian syndrome)   . Rheumatoid arthritis ([place])   . Seasonal allergies   . Sleep apnea, obstructive    uses CPAP nightly - had sleep study 08/22/2007   Review of Systems:   - chest pain, SOB, cough  + left shoulder pain, intermittent LE swelling   Physical Exam: Vitals:   10/13/17 1456  BP: (!) 107/54  Pulse: (!) 105  Temp: 98.2 F (36.8 C)  TempSrc: Oral  SpO2: 97%  Weight: (!) 301 lb 11.2 oz (136.9 kg)  Height: 5' 2"  (1.575 m)   General: Obese female in no acute distress Pulm: Good air movement with no wheezing or crackles  CV: RRR, no murmurs, no rubs  Extremities: Pulses palpable in all extremities, no LE edema   Assessment & Plan:   See Encounters Tab for problem based charting.  Patient discussed with Dr. Beryle Beams

## 2017-10-13 NOTE — Patient Instructions (Signed)
Thank you for allowing [place] to provide your care. Today we did the following:  1. Added Sitagliptin 100 mg once daily   2. Referred you to psychiatry   3. Come back to see me in 1 month.   4. Cut out soda.   Congratulations on the 20 pound weight loss!

## 2017-10-17 ENCOUNTER — Telehealth: Payer: Self-pay | Admitting: Internal Medicine

## 2017-10-17 NOTE — Telephone Encounter (Signed)
Pt needs prior authorizations for diabetes medicine, she haven't started yet;  Pt contact# [phone]  Needs a prescription for yeast infection, previous   [facility] [place] [facility] [place]

## 2017-10-18 ENCOUNTER — Ambulatory Visit: Payer: Medicaid Other | Admitting: Family Medicine

## 2017-10-19 ENCOUNTER — Telehealth: Payer: Self-pay | Admitting: *Deleted

## 2017-10-19 NOTE — Telephone Encounter (Signed)
Which diabetes medication does she need a prior auth for? How can we/I do it?

## 2017-10-19 NOTE — Telephone Encounter (Addendum)
Information was sent to [place] for PA for Januvia.  Awaiting decision.  Sander Nephew, RN 10/19/2017 11:12 AM. Valentina Gu from [employer] PA approved 10/19/2017 through 10/19/2018. [facility] was notified at [phone]. Sander Nephew, RN 12:01 AM

## 2017-10-20 ENCOUNTER — Encounter: Payer: Self-pay | Admitting: Family Medicine

## 2017-10-20 ENCOUNTER — Ambulatory Visit: Payer: Medicaid Other | Admitting: Family Medicine

## 2017-10-20 VITALS — BP 165/96 | HR 94 | Ht 62.0 in | Wt 306.8 lb

## 2017-10-20 DIAGNOSIS — M25552 Pain in left hip: Secondary | ICD-10-CM | POA: Diagnosis not present

## 2017-10-20 DIAGNOSIS — M25511 Pain in right shoulder: Secondary | ICD-10-CM | POA: Diagnosis present

## 2017-10-20 MED ORDER — MELOXICAM 15 MG PO TABS: 15.0000 mg | ORAL_TABLET | Freq: Every day | ORAL | 2 refills | Status: DC

## 2017-10-20 MED ORDER — METHOCARBAMOL 500 MG PO TABS: 500.0000 mg | ORAL_TABLET | Freq: Three times a day (TID) | ORAL | 1 refills | Status: DC | PRN

## 2017-10-20 NOTE — Telephone Encounter (Signed)
PA completed -see Regino Schultze telephone encounter.

## 2017-10-20 NOTE — Patient Instructions (Addendum)
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 You're getting spasms right now in the infraspinatus muscle and the trapezius. These are the different medications you can take for this: Tylenol 570m 1-2 tabs three times a day for pain. Capsaicin, aspercreme, or biofreeze topically up to four times a day may also help with pain. Meloxicam 15 mg daily with food for pain and inflammation. Robaxin as needed for spasms. Heat 15 minutes at a time 3-4 times a day as needed to help with pain.  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 Start physical therapy for this and your shoulder. Medications as noted above though I wouldn't put capsaicin in this area. Follow up with me in 1 month.

## 2017-10-20 NOTE — Telephone Encounter (Signed)
Thank you all for your help.

## 2017-10-20 NOTE — Telephone Encounter (Signed)
I will send this message for Ana Long to f/u.

## 2017-10-23 ENCOUNTER — Encounter: Payer: Self-pay | Admitting: Family Medicine

## 2017-10-23 NOTE — Progress Notes (Signed)
PCP: Ina Homes, MD  Subjective:   HPI: Patient is a 42 y.o. female here for right shoulder pain, hip pain.  4/8: Patient reports for about 1-1/2 months she has had superior right shoulder pain. Pain is up to a 6 out of 10 in sore, tender. She is tried heat, stretching, ibuprofen 600 mg. Bothers her with sleeping and laying on the right side. No skin changes or numbness.  7/11: Patient returns reporting right shoulder still bothering her quite a bit at 7/10 level, mostly superiorly and posteriorly. Worse lying on right side. Also with new right posterior hip, buttock pain at 9/10 level. Difficult getting out of bed. Pain radiates down leg posteriorly. No back pain. Some numbness in same distribution. No bowel/bladder dysfunction.  8/29: Patient reports her right hip is better.  She's having anterior left hip pain now though. Pain within the groin, worse with walking. Gets a catching with this also. Pain in right shoulder is at 9/10 level, sharp. Worse posterior aspect of shoulder. Did 2 visits of PT and doing some of exercises. Taking ibuprofen, using voltaren gel. No skin changes numbness.  Past Medical History:  Diagnosis Date  . Arthritis    major joints  . Asthma    prn inhaler  . Carpal tunnel syndrome of right wrist 09/2012  . Complication of anesthesia    states is hard to wake up post-op  . Diabetes mellitus    intolerant to Metformin  . Dry skin    hands   . Family history of anesthesia complication    mother went into coma during c-section  . GERD (gastroesophageal reflux disease)   . Herpes genital   . Morbid obesity with BMI of 50 to 60 07/23/2009  . Obesity hypoventilation syndrome ([place]) 10/30/2012  . Obesity, morbid ([place]) 01/10/2014  . OSA on CPAP     AHI was on  11-17-12 to 120/hr, titrated to 15 cm with 3 cm EPR/   . PCOS (polycystic ovarian syndrome)   . Rheumatoid arthritis ([place])   . Seasonal allergies   . Sleep apnea, obstructive    uses  CPAP nightly - had sleep study 08/22/2007    Current Outpatient Medications on File Prior to Visit  Medication Sig Dispense Refill  . acyclovir (ZOVIRAX) 400 MG tablet Take 1 tablet (400 mg total) by mouth 2 (two) times daily. 60 tablet 2  . albuterol (PROAIR HFA) 108 (90 Base) MCG/ACT inhaler Inhale 2 puffs into the lungs every 4 (four) hours as needed for wheezing or shortness of breath (cough). 6.7 g 2  . atorvastatin (LIPITOR) 10 MG tablet Take 1 tablet (10 mg total) by mouth daily. 90 tablet 1  . benzonatate (TESSALON) 100 MG capsule Take 1 capsule (100 mg total) by mouth every 8 (eight) hours. 21 capsule 0  . diclofenac (VOLTAREN) 75 MG EC tablet Take 1 tablet (75 mg total) by mouth 2 (two) times daily. 60 tablet 1  . diclofenac sodium (VOLTAREN) 1 % GEL Apply 2 g topically 4 (four) times daily as needed. 100 g 0  . glipiZIDE (GLUCOTROL) 10 MG tablet Take 1 tablet (10 mg total) by mouth 2 (two) times daily before a meal. 60 tablet 3  . glucose blood (ACCU-CHEK SMARTVIEW) test strip TEST THREE TIMES DAILY 150 each 3  . ibuprofen (ADVIL,MOTRIN) 600 MG tablet Take 1 tablet (600 mg total) by mouth every 8 (eight) hours as needed. 90 tablet 1  . Insulin Glargine (LANTUS SOLOSTAR) 100 UNIT/ML Solostar Pen  Inject 73 Units into the skin every morning. 15 mL 6  . Insulin Pen Needle (B-D UF III MINI PEN NEEDLES) 31G X 5 MM MISC USE AS DIRECTED TO INJECT INSULIN TWICE A DAY E11.65 100 each 3  . lactulose (CEPHULAC) 10 g packet Take 1 packet (10 g total) by mouth daily as needed. Reported on 07/29/2015 30 each 0  . Lancets (ACCU-CHEK SOFT TOUCH) lancets Use as instructed 100 each 12  . loratadine (CLARITIN) 10 MG tablet TAKE 1 TABLET (10 MG TOTAL) BY MOUTH 2 (TWO) TIMES DAILY. 60 tablet 1  . mometasone-formoterol (DULERA) 200-5 MCG/ACT AERO Inhale 2 puffs into the lungs 2 (two) times daily. RINSE MOUTH AFTER EACH USE 1 Inhaler 1  . nitroGLYCERIN (NITRODUR - DOSED IN MG/24 HR) 0.2 mg/hr patch Apply 1/4th  patch to affected achilles, change daily 30 patch 1  . omeprazole (PRILOSEC) 20 MG capsule Take 1 capsule (20 mg total) by mouth daily before breakfast. 90 capsule 2  . Probiotic Product (PROBIOTIC DAILY PO) Take 2 capsules by mouth every evening.     . sitaGLIPtin (JANUVIA) 100 MG tablet Take 1 tablet (100 mg total) by mouth daily. 30 tablet 3  . TURMERIC PO Take 2 tablets by mouth at bedtime. Takes generic turmeric     No current facility-administered medications on file prior to visit.     Past Surgical History:  Procedure Laterality Date  . CARPAL TUNNEL RELEASE Right 10/02/2012   Procedure: RIGHT CARPAL TUNNEL RELEASE ENDOSCOPIC;  Surgeon: Jolyn Nap, MD;  Location: [facility];  Service: Orthopedics;  Laterality: Right;  . CHOLECYSTECTOMY N/A 12/02/2014   Procedure: LAPAROSCOPIC CHOLECYSTECTOMY;  Surgeon: Armandina Gemma, MD;  Location: [facility];  Service: General;  Laterality: N/A;  . HYSTEROSCOPY W/D&C  01-08-2002    Allergies  Allergen Reactions  . Citrus Hives and Itching  . Latex Swelling  . Penicillins Hives and Itching  . Soap Hives, Itching and Swelling    PUREX LAUNDRY DETERGENT  . Tomato Hives and Itching  . Fluticasone     Per patient, makes sneeze and gag  . Hydrocodone Itching and Nausea Only  . Other Itching    Chocolate, Bananas, Acidic foods  . Shellfish Allergy   . Doxycycline Other (See Comments)    AFFECTS JOINTS  . Metformin And Related Rash    GI UPSET    Social History   Socioeconomic History  . Marital status: Divorced    Spouse name: Not on file  . Number of children: 2  . Years of education: 30  . Highest education level: Not on file  Occupational History  . Occupation: UNEMPLOYED    Employer: UNEMPLOYED  Social Needs  . Financial resource strain: Not on file  . Food insecurity:    Worry: Not on file    Inability: Not on file  . Transportation needs:    Medical: Not on file    Non-medical: Not on file  Tobacco Use   . Smoking status: Former Smoker    Years: 10.00    Last attempt to quit: 05/24/2010    Years since quitting: 7.4  . Smokeless tobacco: Never Used  Substance and Sexual Activity  . Alcohol use: Yes    Alcohol/week: 0.0 standard drinks    Comment: rarely  . Drug use: No  . Sexual activity: Not on file    Comment: Lives w/a partner  Lifestyle  . Physical activity:    Days per week: Not on file  Minutes per session: Not on file  . Stress: Not on file  Relationships  . Social connections:    Talks on phone: Not on file    Gets together: Not on file    Attends religious service: Not on file    Active member of club or organization: Not on file    Attends meetings of clubs or organizations: Not on file    Relationship status: Not on file  . Intimate partner violence:    Fear of current or ex partner: Not on file    Emotionally abused: Not on file    Physically abused: Not on file    Forced sexual activity: Not on file  Other Topics Concern  . Not on file  Social History Narrative   Single, but lives with a partner   Has 2 children and cares for an infant during the day          Family History  Problem Relation Age of Onset  . Other Mother   . Leukemia Cousin   . Other Other   . Obesity Other   . Other Other   . Diabetes Maternal Aunt   . Heart attack Maternal Aunt   . Alzheimer's disease Maternal Uncle   . Cancer Maternal Uncle        prostate  . Diabetes Paternal Aunt   . Cancer Paternal Aunt        brain,mouth  . Alzheimer's disease Paternal Aunt   . Diabetes Paternal Uncle   . Diabetes Maternal Grandmother   . Diabetes Maternal Grandfather     BP (!) 165/96   Pulse 94   Ht 5' 2"  (1.575 m)   Wt (!) 306 lb 12.8 oz (139.2 kg)   LMP 09/22/2017   BMI 56.11 kg/m   Review of Systems: See HPI above.     Objective:  Physical Exam:  Gen: NAD, comfortable in exam room  Right shoulder: No swelling, ecchymoses.  No gross deformity. TTP right trapezius,  over infraspinatus.  No other tenderness. FROM with posterior pain on flexion and abduction. Negative Hawkins, Neers. Negative Yergasons. Strength 5/5 with empty can and resisted internal/external rotation. Negative apprehension. NV intact distally.  Back/left hip: No gross deformity, scoliosis. No trochanter, back tenderness.  No midline or bony TTP. FROM with pain on hip flexion. Strength LEs 5/5 all muscle groups except 4/5 hip flexion.   Negative SLRs. Sensation intact to light touch bilaterally. Negative logroll  Assessment & Plan:  1.  Right shoulder pain: AC arthritis improved.  However, still with posterior pain consistent with spasms of trapezius and infraspinatus.  Tylenol, topical medications, meloxicam with robaxin and heat as needed.  2. 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  Start physical therapy for this and shoulder.  Tylenol, meloxicam with robaxin as needed.  F/u in 1 month.

## 2017-11-01 NOTE — Addendum Note (Signed)
Addended by: Yvonna Alanis E on: 11/01/2017 06:00 PM   Modules accepted: Orders

## 2017-11-08 ENCOUNTER — Other Ambulatory Visit: Payer: Self-pay | Admitting: *Deleted

## 2017-11-08 DIAGNOSIS — J302 Other seasonal allergic rhinitis: Secondary | ICD-10-CM

## 2017-11-08 MED ORDER — LORATADINE 10 MG PO TABS: 10.0000 mg | ORAL_TABLET | Freq: Two times a day (BID) | ORAL | 1 refills | Status: DC

## 2017-11-15 ENCOUNTER — Ambulatory Visit: Payer: Medicaid Other | Attending: Family Medicine | Admitting: Physical Therapy

## 2017-11-15 ENCOUNTER — Other Ambulatory Visit: Payer: Self-pay

## 2017-11-15 DIAGNOSIS — M25611 Stiffness of right shoulder, not elsewhere classified: Secondary | ICD-10-CM | POA: Diagnosis present

## 2017-11-15 DIAGNOSIS — M25552 Pain in left hip: Secondary | ICD-10-CM | POA: Insufficient documentation

## 2017-11-15 DIAGNOSIS — M25551 Pain in right hip: Secondary | ICD-10-CM | POA: Diagnosis present

## 2017-11-15 DIAGNOSIS — R262 Difficulty in walking, not elsewhere classified: Secondary | ICD-10-CM | POA: Insufficient documentation

## 2017-11-15 DIAGNOSIS — M25511 Pain in right shoulder: Secondary | ICD-10-CM | POA: Insufficient documentation

## 2017-11-15 DIAGNOSIS — M6281 Muscle weakness (generalized): Secondary | ICD-10-CM | POA: Diagnosis present

## 2017-11-15 LAB — HM DIABETES EYE EXAM

## 2017-11-15 NOTE — Therapy (Addendum)
[facility] [address]  [place], [place], [address] Phone: [phone]   Fax:  [phone]  Physical Therapy Evaluation  Patient Details  Name: Ana Long MRN: [id number] Date of Birth: 07/10/1975 Referring Provider: Karlton Lemon, MD   Encounter Date: 11/15/2017  PT End of Session - 11/15/17 1418    Visit Number  1    Number of Visits  13    Date for PT Re-Evaluation  12/30/17    Authorization Type  Medicaid    PT Start Time  9242   Pt arrived late   PT Stop Time  1407    PT Time Calculation (min)  42 min    Activity Tolerance  Patient tolerated treatment well;No increased pain    Behavior During Therapy  WFL for tasks assessed/performed       Past Medical History:  Diagnosis Date  . Arthritis    major joints  . Asthma    prn inhaler  . Carpal tunnel syndrome of right wrist 09/2012  . Complication of anesthesia    states is hard to wake up post-op  . Diabetes mellitus    intolerant to Metformin  . Dry skin    hands   . Family history of anesthesia complication    mother went into coma during c-section  . GERD (gastroesophageal reflux disease)   . Herpes genital   . Morbid obesity with BMI of 50 to 60 07/23/2009  . Obesity hypoventilation syndrome ([place]) 10/30/2012  . Obesity, morbid ([place]) 01/10/2014  . OSA on CPAP     AHI was on  11-17-12 to 120/hr, titrated to 15 cm with 3 cm EPR/   . PCOS (polycystic ovarian syndrome)   . Rheumatoid arthritis ([place])   . Seasonal allergies   . Sleep apnea, obstructive    uses CPAP nightly - had sleep study 08/22/2007    Past Surgical History:  Procedure Laterality Date  . CARPAL TUNNEL RELEASE Right 10/02/2012   Procedure: RIGHT CARPAL TUNNEL RELEASE ENDOSCOPIC;  Surgeon: Jolyn Nap, MD;  Location: [facility];  Service: Orthopedics;  Laterality: Right;  . CHOLECYSTECTOMY N/A 12/02/2014   Procedure: LAPAROSCOPIC CHOLECYSTECTOMY;   Surgeon: Armandina Gemma, MD;  Location[facility];  Service: General;  Laterality: N/A;  . HYSTEROSCOPY W/D&C  01-08-2002    There were no vitals filed for this visit.   Subjective Assessment - 11/15/17 1329    Subjective  Pt reports ongoing R shoulder pain that orginated 3-4 yrs ago but has flared up over the past 4 months. Pain keeps her up at night and prevents her from using R arm with ADLs and household chores. Notes intemitttent swelling in R UE, sometimes all the way down to hand. B hip pain typically with downward step off curb or walking down an incline - sharp onset that lingers for a while. 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    Limitations  House hold activities;Lifting;Walking    Patient Stated Goals  "to sleep peacefully t/o the night w/o shoulder pain; lose weight and stop my hips from snapping"    Currently in Pain?  Yes    Pain Score  6     Pain Location  Shoulder    Pain Orientation  Right    Pain Descriptors / Indicators  Dull;Constant    Pain Type  Chronic pain    Pain Radiating Towards  intermittent "pulling" or "tingling" down R to elbow  Pain Onset  More than a month ago   4 months   Pain Frequency  Constant    Aggravating Factors   sleeping on R side, pushing up with R arm    Pain Relieving Factors  rest; Meloxicam & muscle relaxant; hot water in shower; massage    Effect of Pain on Daily Activities  difficulty with ADLs and functional use of R UE with household chores    Multiple Pain Sites  Yes    Pain Score  0   up to 10/10 "when it hits me"   Pain Location  Hip   & groin   Pain Orientation  Right;Left;Anterior    Pain Descriptors / Indicators  Sharp   "snagging"   Pain Type  Chronic pain    Pain Radiating Towards  n/a    Pain Onset  Other (comment)   8-9 years   Aggravating Factors   stepping down or walking down incline, walking down stairs    Pain Relieving Factors  rest - go sit down    Effect of Pain on Daily Activities  difficulty walking  down inclines/stairs; fear of falling if both hips "snag" at the same time         [facility] PT Assessment - 11/15/17 1325      Assessment   Medical Diagnosis  Acute R shoulder pain; B hip pain (snapping hip syndrome)    Referring Provider  Karlton Lemon, MD    Onset Date/Surgical Date  --   4 months for R shoulder; 8-9 yrs for hips   Hand Dominance  Right    Next MD Visit  TBD      Precautions   Precautions  None      Restrictions   Weight Bearing Restrictions  No      Balance Screen   Has the patient fallen in the past 6 months  No    Has the patient had a decrease in activity level because of a fear of falling?   Yes    Is the patient reluctant to leave their home because of a fear of falling?   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, cleaning, cooking    Leisure  shopping      Cognition   Overall Cognitive Status  Within Functional Limits for tasks assessed      Sensation   Light Touch  Appears Intact   intermittent R hand tingling; hx of carpal tunnel release R     Coordination   Gross Motor Movements are Fluid and Coordinated  Yes      Posture/Postural Control   Posture/Postural Control  Postural limitations    Postural Limitations  Rounded Shoulders      ROM / Strength  AROM / PROM / Strength  AROM;Strength      AROM   AROM Assessment Site  Shoulder    Right Shoulder Flexion  149 Degrees    Right Shoulder  ABduction  128 Degrees    Right Shoulder Internal Rotation  56 Degrees   ~45 abduction in supine   Right Shoulder External Rotation  78 Degrees   ~45 abduction in supine   Left Shoulder Flexion  158 Degrees    Left Shoulder ABduction  150 Degrees    Left Shoulder Internal Rotation  80 Degrees    Left Shoulder External Rotation  78 Degrees      Strength   Strength Assessment Site  Shoulder;Hip;Knee    Right/Left Shoulder  Right;Left    Right Shoulder Flexion  3+/5    Right Shoulder ABduction  3+/5    Right Shoulder Internal Rotation  4-/5    Right Shoulder External Rotation  3+/5    Left Shoulder Flexion  4+/5    Left Shoulder ABduction  4+/5    Left Shoulder Internal Rotation  4+/5    Left Shoulder External Rotation  4/5    Right/Left Hip  Right;Left    Right Hip Flexion  3+/5    Right Hip Extension  3+/5    Right Hip External Rotation   4-/5    Right Hip Internal Rotation  4-/5    Right Hip ABduction  4+/5    Right Hip ADduction  4/5    Left Hip Flexion  3+/5    Left Hip Extension  3+/5    Left Hip External Rotation  4-/5    Left Hip Internal Rotation  4-/5    Left Hip ABduction  4+/5    Left Hip ADduction  4/5    Right/Left Knee  Right;Left    Right Knee Flexion  3+/5    Right Knee Extension  4-/5    Left Knee Flexion  4+/5    Left Knee Extension  4+/5      Flexibility   Soft Tissue Assessment /Muscle Length  yes    Hamstrings  mild tightness B    ITB  mod tightness B    Piriformis  mild/mod tightness B      Palpation   Palpation comment  TTP in R AC joint, pecs posterior/inferior shoulder                Objective measurements completed on examination: See above findings.                PT Short Term Goals - 11/15/17 1407      PT SHORT TERM GOAL #1   Title  Patient to be independent with initial HEP    Status  New    Target Date  12/06/17        PT Long Term Goals - 11/15/17 1407      PT LONG TERM GOAL #1   Title  Patient to be  independent with advanced HEP.    Status  New    Target Date  12/30/17      PT LONG TERM GOAL #2   Title  Patient to demonstrate [facility] and pain-free R shoulder AROM.    Status  New    Target Date  12/30/17      PT LONG TERM GOAL #3   Title  Patient to demonstrate >/= 4+/5 strength in R shoulder to facilitate functional UE use with daily tasks    Status  New    Target Date  12/30/17      PT LONG TERM GOAL #4   Title  Patient to report tolerance of 1 hour of R sidelying without increased R shoulder pain.    Status  New    Target Date  12/30/17      PT LONG TERM GOAL #5   Title  Patient to report no incidences of LE buckling due to snapping hip syndrome in either hip.    Status  New    Target Date  12/30/17             Plan - 11/15/17 1407    Clinical Impression Statement  Naia a 42 y/o female who is returning to [facility]PPT for R shoulder pain and B hip pain. Pt initiated therapy episode for same issues back in July but was discharged after only one treatment visit as she failed to return for any further scheduled visits before the initial Medicaid authorization ran out. R shoulder pain appears to be an acute exacerbation over the past ~4 months of pain that has been fluctuating for the past ~3-4 yrs ago, and B hip pain has been intermittent for the last 8-9 years which the MD has identified as snapping hip syndrome. R shoulder pain interferes with sleep and causes difficulty with functional use of R UE with ADLs and household chores. B hip pain creates difficulty with walking down inclines or stairs and leads to fear of falling if both hips were to "snap" at the same time.  Patient today with limited and painful R shoulder AROM, decreased strength, and tenderness at [facility] joint and posterior shoulder complex. Hip ROM limited by morbid obesity and decreased proximal LE flexibility, with decreased proximal LE strength also evident. Unable to initiate HEP today due to patient late arrival to  appointment and multiple problems requiring PT assessment, but pt has existing HEP from prior PT episode and answered patient's questions regarding appropriate aquatic based exercises/activities to target her current deficits. Will plan reassess and update HEP as indicated at next session. Karyna would benefit from continued skilled PT services to address aforementioned impairments.      History and Personal Factors relevant to plan of care:  arthritis, asthma, R carpal tunnel release, DM, GERD, morbid obesity, RA, B achilles tendinitis, acute on chronic R knee pain    Clinical Presentation  Evolving    Clinical Presentation due to:  multiple active problems with acute exacerbations of chronic conditions + complex medical history complicated by morbid obesity    Clinical Decision Making  Moderate    Rehab Potential  Fair    Clinical Impairments Affecting Rehab Potential  arthritis, asthma, R carpal tunnel release, DM, GERD, morbid obesity, RA, B achilles tendinitis, acute on chronic R knee pain    PT Frequency  2x / week    PT Duration  6 weeks    PT Treatment/Interventions  ADLs/Self Care Home Management;Cryotherapy;Electrical Stimulation;Iontophoresis 38m/ml Dexamethasone;Moist Heat;Ultrasound;Therapeutic activities;Therapeutic exercise;Manual techniques;Patient/family education;Passive range of motion;Dry needling;Energy conservation;Splinting;Taping;Vasopneumatic Device    PT Next Visit Plan  Review prior HEP & update as indicated    Consulted and Agree with Plan of Care  Patient       Patient will benefit from skilled therapeutic intervention in order to improve the following deficits and impairments:  Decreased activity tolerance, Decreased strength, Impaired UE functional use, Pain, Decreased range of motion, Postural dysfunction, Impaired flexibility, Difficulty walking, Abnormal gait  Visit Diagnosis: Acute pain of right shoulder -  Plan: PT plan of care cert/re-cert  Stiffness of right  shoulder, not elsewhere classified - Plan: PT plan of care cert/re-cert  Muscle weakness (generalized) - Plan: PT plan of care cert/re-cert  Pain in left hip - Plan: PT plan of care cert/re-cert  Pain in right hip - Plan: PT plan of care cert/re-cert  Difficulty in walking, not elsewhere classified - Plan: PT plan of care cert/re-cert     Problem List Patient Active Problem List   Diagnosis Date Noted  . Mild episode of recurrent major depressive disorder ([place]) 10/13/2017  . Uncontrolled type 2 diabetes mellitus without complication, with long-term current use of insulin ([place]) 09/05/2017  . Right hip pain 09/04/2017  . Achilles tendinitis of both lower extremities 07/24/2017  . Right shoulder pain 06/01/2017  . 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 Menorrhagia 11/06/2015  . Right ankle pain 06/19/2015  . Second degree uterine prolapse 02/12/2015  . Dyspareunia in female 01/24/2015  . Constipation 12/30/2014  . Urinary retention 12/09/2014  . Non-proliferative diabetic retinopathy ([place]) 04/22/2014  . 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 Seasonal allergies 01/17/2013  . OSA on CPAP   . Low back pain 11/22/2012  . Obesity hypoventilation syndrome ([place]) 10/30/2012  . Lower extremity edema 10/18/2012  . DUB (dysfunctional uterine bleeding) 03/01/2012  . Hypersomnia with sleep apnea   . Asthma 07/06/2011  . Osteoarthritis, knee 02/03/2010  . Morbid obesity with BMI of 50 to 60 07/23/2009  . Genital herpes 01/01/2006  . Uncontrolled type 2 diabetes mellitus with hyperglycemia, with long-term current use of insulin ([place]) 01/01/2006  . Hyperlipidemia 01/01/2006  . GERD 01/01/2006    Percival Spanish, PT, MPT 11/16/2017, 8:14 AM  [facility] [address]  [place], [place], [address] Phone: [phone]   Fax:  [phone]  Name: Ana Long MRN: [id number] Date of Birth: [birth year]

## 2017-11-16 ENCOUNTER — Encounter: Payer: Self-pay | Admitting: Obstetrics & Gynecology

## 2017-11-16 ENCOUNTER — Encounter

## 2017-11-16 ENCOUNTER — Other Ambulatory Visit (HOSPITAL_COMMUNITY)
Admission: RE | Admit: 2017-11-16 | Discharge: 2017-11-16 | Disposition: A | Discharge status: Home / Self Care | Payer: Medicaid Other | Source: Ambulatory Visit | Attending: Obstetrics & Gynecology | Admitting: Obstetrics & Gynecology

## 2017-11-16 ENCOUNTER — Ambulatory Visit (INDEPENDENT_AMBULATORY_CARE_PROVIDER_SITE_OTHER): Payer: Medicaid Other | Admitting: Obstetrics & Gynecology

## 2017-11-16 VITALS — BP 125/65 | HR 87 | Ht 62.0 in | Wt 307.8 lb

## 2017-11-16 DIAGNOSIS — Z01419 Encounter for gynecological examination (general) (routine) without abnormal findings: Secondary | ICD-10-CM

## 2017-11-16 DIAGNOSIS — B3731 Acute candidiasis of vulva and vagina: Secondary | ICD-10-CM

## 2017-11-16 DIAGNOSIS — Z1231 Encounter for screening mammogram for malignant neoplasm of breast: Secondary | ICD-10-CM

## 2017-11-16 DIAGNOSIS — B373 Candidiasis of vulva and vagina: Secondary | ICD-10-CM

## 2017-11-16 DIAGNOSIS — Z23 Encounter for immunization: Secondary | ICD-10-CM | POA: Diagnosis not present

## 2017-11-16 DIAGNOSIS — N926 Irregular menstruation, unspecified: Secondary | ICD-10-CM

## 2017-11-16 DIAGNOSIS — Z0000 Encounter for general adult medical examination without abnormal findings: Secondary | ICD-10-CM | POA: Diagnosis present

## 2017-11-16 NOTE — Progress Notes (Signed)
GYNECOLOGY ANNUAL PREVENTATIVE CARE ENCOUNTER NOTE  Subjective:   Ana Long is a 42 y.o. G0P0000 female here for a routine annual gynecologic exam.  Current complaints: skipped period for August, a little abnormal in July and September.  Denies abnormal vaginal bleeding, discharge, pelvic pain, problems with intercourse or other gynecologic concerns.  Desires yearly STI screen.    Gynecologic History Patient's last menstrual period was 11/10/2017 (exact date). Contraception: none Last Pap: 12/31/2015. Results were: normal with negative HPV Last mammogram: 03/24/2015. Results were: normal  Obstetric History OB History  Gravida Para Term Preterm AB Living  0 0 0 0 0 0  SAB TAB Ectopic Multiple Live Births  0 0 0 0 0    Past Medical History:  Diagnosis Date  . Arthritis    major joints  . Asthma    prn inhaler  . Carpal tunnel syndrome of right wrist 09/2012  . Complication of anesthesia    states is hard to wake up post-op  . Diabetes mellitus    intolerant to Metformin  . Dry skin    hands   . Family history of anesthesia complication    mother went into coma during c-section  . GERD (gastroesophageal reflux disease)   . Herpes genital   . Morbid obesity with BMI of 50 to 60 07/23/2009  . Obesity hypoventilation syndrome ([place]) 10/30/2012  . Obesity, morbid ([place]) 01/10/2014  . OSA on CPAP     AHI was on  11-17-12 to 120/hr, titrated to 15 cm with 3 cm EPR/   . PCOS (polycystic ovarian syndrome)   . Rheumatoid arthritis ([place])   . Seasonal allergies   . Sleep apnea, obstructive    uses CPAP nightly - had sleep study 08/22/2007    Past Surgical History:  Procedure Laterality Date  . CARPAL TUNNEL RELEASE Right 10/02/2012   Procedure: RIGHT CARPAL TUNNEL RELEASE ENDOSCOPIC;  Surgeon: Jolyn Nap, MD;  Location: [place] [facility];  Service: Orthopedics;  Laterality: Right;  . CHOLECYSTECTOMY N/A 12/02/2014   Procedure: LAPAROSCOPIC CHOLECYSTECTOMY;   Surgeon: Armandina Gemma, MD;  Location: [facility];  Service: General;  Laterality: N/A;  . HYSTEROSCOPY W/D&C  01-08-2002    Current Outpatient Medications on File Prior to Visit  Medication Sig Dispense Refill  . acyclovir (ZOVIRAX) 400 MG tablet Take 1 tablet (400 mg total) by mouth 2 (two) times daily. 60 tablet 2  . albuterol (PROAIR HFA) 108 (90 Base) MCG/ACT inhaler Inhale 2 puffs into the lungs every 4 (four) hours as needed for wheezing or shortness of breath (cough). 6.7 g 2  . benzonatate (TESSALON) 100 MG capsule Take 1 capsule (100 mg total) by mouth every 8 (eight) hours. 21 capsule 0  . diclofenac (VOLTAREN) 75 MG EC tablet Take 1 tablet (75 mg total) by mouth 2 (two) times daily. 60 tablet 1  . diclofenac sodium (VOLTAREN) 1 % GEL Apply 2 g topically 4 (four) times daily as needed. 100 g 0  . glipiZIDE (GLUCOTROL) 10 MG tablet Take 1 tablet (10 mg total) by mouth 2 (two) times daily before a meal. 60 tablet 3  . glucose blood (ACCU-CHEK SMARTVIEW) test strip TEST THREE TIMES DAILY 150 each 3  . ibuprofen (ADVIL,MOTRIN) 600 MG tablet Take 1 tablet (600 mg total) by mouth every 8 (eight) hours as needed. 90 tablet 1  . Insulin Glargine (LANTUS SOLOSTAR) 100 UNIT/ML Solostar Pen Inject 73 Units into the skin every morning. 15 mL 6  .  Insulin Pen Needle (B-D UF III MINI PEN NEEDLES) 31G X 5 MM MISC USE AS DIRECTED TO INJECT INSULIN TWICE A DAY E11.65 100 each 3  . lactulose (CEPHULAC) 10 g packet Take 1 packet (10 g total) by mouth daily as needed. Reported on 07/29/2015 30 each 0  . Lancets (ACCU-CHEK SOFT TOUCH) lancets Use as instructed 100 each 12  . loratadine (CLARITIN) 10 MG tablet Take 1 tablet (10 mg total) by mouth 2 (two) times daily. 60 tablet 1  . meloxicam (MOBIC) 15 MG tablet Take 1 tablet (15 mg total) by mouth daily. 30 tablet 2  . methocarbamol (ROBAXIN) 500 MG tablet Take 1 tablet (500 mg total) by mouth every 8 (eight) hours as needed. 60 tablet 1  . mometasone-formoterol  (DULERA) 200-5 MCG/ACT AERO Inhale 2 puffs into the lungs 2 (two) times daily. RINSE MOUTH AFTER EACH USE 1 Inhaler 1  . nitroGLYCERIN (NITRODUR - DOSED IN MG/24 HR) 0.2 mg/hr patch Apply 1/4th patch to affected achilles, change daily 30 patch 1  . omeprazole (PRILOSEC) 20 MG capsule Take 1 capsule (20 mg total) by mouth daily before breakfast. 90 capsule 2  . sitaGLIPtin (JANUVIA) 100 MG tablet Take 1 tablet (100 mg total) by mouth daily. 30 tablet 3  . TURMERIC PO Take 2 tablets by mouth at bedtime. Takes generic turmeric    . atorvastatin (LIPITOR) 10 MG tablet Take 1 tablet (10 mg total) by mouth daily. (Patient not taking: Reported on 11/16/2017) 90 tablet 1  . Probiotic Product (PROBIOTIC DAILY PO) Take 2 capsules by mouth every evening.      No current facility-administered medications on file prior to visit.     Allergies  Allergen Reactions  . Citrus Hives and Itching  . Latex Swelling  . Penicillins Hives and Itching  . Soap Hives, Itching and Swelling    PUREX LAUNDRY DETERGENT  . Tomato Hives and Itching  . Fluticasone     Per patient, makes sneeze and gag  . Hydrocodone Itching and Nausea Only  . Other Itching    Chocolate, Bananas, Acidic foods  . Shellfish Allergy   . Doxycycline Other (See Comments)    AFFECTS JOINTS  . Metformin And Related Rash    GI UPSET    Social History:  reports that she quit smoking about 7 years ago. She quit after 10.00 years of use. She has never used smokeless tobacco. She reports that she drinks alcohol. She reports that she does not use drugs.  Family History  Problem Relation Age of Onset  . Other Mother   . Leukemia Cousin   . Other Other   . Obesity Other   . Other Other   . Diabetes Maternal Aunt   . Heart attack Maternal Aunt   . Alzheimer's disease Maternal Uncle   . Cancer Maternal Uncle        prostate  . Diabetes Paternal Aunt   . Cancer Paternal Aunt        brain,mouth  . Alzheimer's disease Paternal Aunt   .  Diabetes Paternal Uncle   . Diabetes Maternal Grandmother   . Diabetes Maternal Grandfather     The following portions of the patient's history were reviewed and updated as appropriate: allergies, current medications, past family history, past medical history, past social history, past surgical history and problem list.  Review of Systems Pertinent items noted in HPI and remainder of comprehensive ROS otherwise negative.   Objective:  BP 125/65  Pulse 87   Ht 5' 2"  (1.575 m)   Wt (!) 307 lb 12.8 oz (139.6 kg)   LMP 11/10/2017 (Exact Date)   BMI 56.30 kg/m  CONSTITUTIONAL: Well-developed, well-nourished female in no acute distress.  HENT:  Normocephalic, atraumatic, External right and left ear normal. Oropharynx is clear and moist EYES: Conjunctivae and EOM are normal. Pupils are equal, round, and reactive to light. No scleral icterus.  NECK: Normal range of motion, supple, no masses.  Normal thyroid.  SKIN: Skin is warm and dry. No rash noted. Not diaphoretic. No erythema. No pallor. NEUROLOGIC: Alert and oriented to person, place, and time. Normal reflexes, muscle tone coordination. No cranial nerve deficit noted. PSYCHIATRIC: Normal mood and affect. Normal behavior. Normal judgment and thought content. CARDIOVASCULAR: Normal heart rate noted, regular rhythm RESPIRATORY: Clear to auscultation bilaterally. Effort and breath sounds normal, no problems with respiration noted. BREASTS: Symmetric in size. No masses, skin changes, nipple drainage, or lymphadenopathy. ABDOMEN: Soft, obese, normal bowel sounds, no distention appreciated.  No tenderness, rebound or guarding.  PELVIC: On examination of external genitalia, the cervix was noted to be almost at the level of the introitus (stable second degree uterine prolapse) which is able to be reduced. There is some anterior compartment prolapse as well; cystocele noted. Otherwise, there is normal appearing external genitalia; normal appearing  vaginal mucosa and cervix. No abnormal discharge noted.  Pap smear obtained.  Unable to palpate uterus or adnexa secondary to habitus.  MUSCULOSKELETAL: Normal range of motion. No tenderness.  No cyanosis, clubbing, or edema.  2+ distal pulses.  Assessment and Plan:  1. Well woman exam - HIV Antibody (routine testing w rflx) - Flu Vaccine QUAD 36+ mos IM - Hepatitis B surface antigen - Hepatitis C antibody - RPR - Cytology - PAP - Cervicovaginal ancillary only Will follow up results of pap smear and STI screen and manage accordingly.  2. Breast cancer screening by mammogram - MM Digital Screening; Future Mammogram scheduled  3. Abnormal menstrual periods Isolated in last 2 months, will evaluate. Likely due to stress, will evaluate for other etiologies - CBC - TSH - Beta hCG quant (ref lab)  Routine preventative health maintenance measures emphasized. Please refer to After Visit Summary for other counseling recommendations.    Verita Schneiders, MD, [place] for [employer], [place]

## 2017-11-16 NOTE — Patient Instructions (Signed)
Preventive Care 40-64 Years, Female Preventive care refers to lifestyle choices and visits with your health care provider that can promote health and wellness. What does preventive care include?  A yearly physical exam. This is also called an annual well check.  Dental exams once or twice a year.  Routine eye exams. Ask your health care provider how often you should have your eyes checked.  Personal lifestyle choices, including: ? Daily care of your teeth and gums. ? Regular physical activity. ? Eating a healthy diet. ? Avoiding tobacco and drug use. ? Limiting alcohol use. ? Practicing safe sex. ? Taking low-dose aspirin daily starting at age 58. ? Taking vitamin and mineral supplements as recommended by your health care provider. What happens during an annual well check? The services and screenings done by your health care provider during your annual well check will depend on your age, overall health, lifestyle risk factors, and family history of disease. Counseling Your health care provider may ask you questions about your:  Alcohol use.  Tobacco use.  Drug use.  Emotional well-being.  Home and relationship well-being.  Sexual activity.  Eating habits.  Work and work Statistician.  Method of birth control.  Menstrual cycle.  Pregnancy history.  Screening You may have the following tests or measurements:  Height, weight, and BMI.  Blood pressure.  Lipid and cholesterol levels. These may be checked every 5 years, or more frequently if you are over 81 years old.  Skin check.  Lung cancer screening. You may have this screening every year starting at age 78 if you have a 30-pack-year history of smoking and currently smoke or have quit within the past 15 years.  Fecal occult blood test (FOBT) of the stool. You may have this test every year starting at age 65.  Flexible sigmoidoscopy or colonoscopy. You may have a sigmoidoscopy every 5 years or a colonoscopy  every 10 years starting at age 30.  Hepatitis C blood test.  Hepatitis B blood test.  Sexually transmitted disease (STD) testing.  Diabetes screening. This is done by checking your blood sugar (glucose) after you have not eaten for a while (fasting). You may have this done every 1-3 years.  Mammogram. This may be done every 1-2 years. Talk to your health care provider about when you should start having regular mammograms. This may depend on whether you have a family history of breast cancer.  BRCA-related cancer screening. This may be done if you have a family history of breast, ovarian, tubal, or peritoneal cancers.  Pelvic exam and Pap test. This may be done every 3 years starting at age 80. Starting at age 36, this may be done every 5 years if you have a Pap test in combination with an HPV test.  Bone density scan. This is done to screen for osteoporosis. You may have this scan if you are at high risk for osteoporosis.  Discuss your test results, treatment options, and if necessary, the need for more tests with your health care provider. Vaccines Your health care provider may recommend certain vaccines, such as:  Influenza vaccine. This is recommended every year.  Tetanus, diphtheria, and acellular pertussis (Tdap, Td) vaccine. You may need a Td booster every 10 years.  Varicella vaccine. You may need this if you have not been vaccinated.  Zoster vaccine. You may need this after age 5.  Measles, mumps, and rubella (MMR) vaccine. You may need at least one dose of MMR if you were born in  1957 or later. You may also need a second dose.  Pneumococcal 13-valent conjugate (PCV13) vaccine. You may need this if you have certain conditions and were not previously vaccinated.  Pneumococcal polysaccharide (PPSV23) vaccine. You may need one or two doses if you smoke cigarettes or if you have certain conditions.  Meningococcal vaccine. You may need this if you have certain  conditions.  Hepatitis A vaccine. You may need this if you have certain conditions or if you travel or work in places where you may be exposed to hepatitis A.  Hepatitis B vaccine. You may need this if you have certain conditions or if you travel or work in places where you may be exposed to hepatitis B.  Haemophilus influenzae type b (Hib) vaccine. You may need this if you have certain conditions.  Talk to your health care provider about which screenings and vaccines you need and how often you need them. This information is not intended to replace advice given to you by your health care provider. Make sure you discuss any questions you have with your health care provider. Document Released: 03/07/2015 Document Revised: 10/29/2015 Document Reviewed: 12/10/2014 Elsevier Interactive Patient Education  2018 Elsevier Inc.  

## 2017-11-16 NOTE — Progress Notes (Signed)
Pt states in June her period came on for 1 day off for 5 then came back on. In August, had no period until 11/10/17. One heavy day, pretty much light.

## 2017-11-17 LAB — CBC
Hematocrit: 39 % (ref 34.0–46.6)
Hemoglobin: 12.2 g/dL (ref 11.1–15.9)
MCH: 25.5 pg — ABNORMAL LOW (ref 26.6–33.0)
MCHC: 31.3 g/dL — ABNORMAL LOW (ref 31.5–35.7)
MCV: 82 fL (ref 79–97)
PLATELETS: 310 10*3/uL (ref 150–450)
RBC: 4.78 x10E6/uL (ref 3.77–5.28)
RDW: 15.3 % (ref 12.3–15.4)
WBC: 7.9 10*3/uL (ref 3.4–10.8)

## 2017-11-17 LAB — RPR: RPR: NONREACTIVE

## 2017-11-17 LAB — TSH: TSH: 1.04 u[IU]/mL (ref 0.450–4.500)

## 2017-11-17 LAB — HEPATITIS B SURFACE ANTIGEN: HEP B S AG: NEGATIVE

## 2017-11-17 LAB — BETA HCG QUANT (REF LAB)

## 2017-11-17 LAB — HEPATITIS C ANTIBODY: Hep C Virus Ab: <0.1 s/co ratio (ref 0.0–0.9)

## 2017-11-17 LAB — HIV ANTIBODY (ROUTINE TESTING W REFLEX): HIV SCREEN 4TH GENERATION: NONREACTIVE

## 2017-11-17 LAB — FOLLICLE STIMULATING HORMONE: FSH: 7.6 m[IU]/mL

## 2017-11-18 LAB — CERVICOVAGINAL ANCILLARY ONLY
BACTERIAL VAGINITIS: NEGATIVE
CANDIDA VAGINITIS: POSITIVE — AB
Chlamydia: NEGATIVE
Neisseria Gonorrhea: NEGATIVE
TRICH (WINDOWPATH): NEGATIVE

## 2017-11-21 ENCOUNTER — Telehealth: Payer: Self-pay | Admitting: *Deleted

## 2017-11-21 ENCOUNTER — Ambulatory Visit: Payer: Medicaid Other | Admitting: Family Medicine

## 2017-11-21 LAB — CYTOLOGY - PAP
Diagnosis: NEGATIVE
HPV: NOT DETECTED

## 2017-11-21 MED ORDER — FLUCONAZOLE 150 MG PO TABS
150.0000 mg | ORAL_TABLET | Freq: Once | ORAL | 3 refills | Status: AC
Start: 2017-11-21 — End: 2017-11-21

## 2017-11-21 NOTE — Telephone Encounter (Signed)
-----   Message from Osborne Oman, MD sent at 11/21/2017  8:25 AM EDT ----- Vaginal discharge test is abnormal and showed yeast infection. Diflucan was prescribed. Please inform patient of results and advise to pick up prescription.

## 2017-11-21 NOTE — Addendum Note (Signed)
Addended by: Verita Schneiders A on: 11/21/2017 08:25 AM   Modules accepted: Orders

## 2017-11-21 NOTE — Telephone Encounter (Signed)
Called pt to inform her of yeast infection and that medication had been called into her pharmacy.  Advised her to pick up the medication and begin taking it as prescribed.  Pt verbalized understanding.

## 2017-11-22 ENCOUNTER — Ambulatory Visit: Payer: Medicaid Other | Admitting: Family Medicine

## 2017-11-22 ENCOUNTER — Ambulatory Visit: Payer: Medicaid Other | Attending: Family Medicine

## 2017-11-22 ENCOUNTER — Encounter: Payer: Self-pay | Admitting: Family Medicine

## 2017-11-22 ENCOUNTER — Ambulatory Visit (HOSPITAL_BASED_OUTPATIENT_CLINIC_OR_DEPARTMENT_OTHER)
Admission: RE | Admit: 2017-11-22 | Discharge: 2017-11-22 | Disposition: A | Discharge status: Home / Self Care | Payer: Medicaid Other | Source: Ambulatory Visit | Attending: Family Medicine | Admitting: Family Medicine

## 2017-11-22 VITALS — BP 143/81 | HR 106 | Ht 62.0 in | Wt 307.0 lb

## 2017-11-22 DIAGNOSIS — M25611 Stiffness of right shoulder, not elsewhere classified: Secondary | ICD-10-CM | POA: Diagnosis present

## 2017-11-22 DIAGNOSIS — M6281 Muscle weakness (generalized): Secondary | ICD-10-CM | POA: Diagnosis present

## 2017-11-22 DIAGNOSIS — R262 Difficulty in walking, not elsewhere classified: Secondary | ICD-10-CM | POA: Diagnosis present

## 2017-11-22 DIAGNOSIS — M25511 Pain in right shoulder: Secondary | ICD-10-CM | POA: Insufficient documentation

## 2017-11-22 DIAGNOSIS — G8929 Other chronic pain: Secondary | ICD-10-CM | POA: Insufficient documentation

## 2017-11-22 DIAGNOSIS — M25552 Pain in left hip: Secondary | ICD-10-CM

## 2017-11-22 DIAGNOSIS — M25551 Pain in right hip: Secondary | ICD-10-CM | POA: Insufficient documentation

## 2017-11-22 NOTE — Therapy (Signed)
[facility] [address]  [place], [place], [address] Phone: [phone]   Fax:  [phone]  Physical Therapy Treatment  Patient Details  Name: Ana Long MRN: [id number] Date of Birth: 18-Apr-1975 Referring Provider (PT): Karlton Lemon, MD   Encounter Date: 11/22/2017  PT End of Session - 11/22/17 1544    Visit Number  2    Number of Visits  13    Date for PT Re-Evaluation  12/30/17    Authorization Type  Medicaid    Authorization Time Period  10.1.43- 11.11.19    Authorization - Visit Number  1    Authorization - Number of Visits  12    PT Start Time  4580    PT Stop Time  1630    PT Time Calculation (min)  55 min    Activity Tolerance  Patient tolerated treatment well;No increased pain    Behavior During Therapy  WFL for tasks assessed/performed       Past Medical History:  Diagnosis Date  . Arthritis    major joints  . Asthma    prn inhaler  . Carpal tunnel syndrome of right wrist 09/2012  . Complication of anesthesia    states is hard to wake up post-op  . Diabetes mellitus    intolerant to Metformin  . Dry skin    hands   . Family history of anesthesia complication    mother went into coma during c-section  . GERD (gastroesophageal reflux disease)   . Herpes genital   . Morbid obesity with BMI of 50 to 60 07/23/2009  . Obesity hypoventilation syndrome ([place]) 10/30/2012  . Obesity, morbid ([place]) 01/10/2014  . OSA on CPAP     AHI was on  11-17-12 to 120/hr, titrated to 15 cm with 3 cm EPR/   . PCOS (polycystic ovarian syndrome)   . Rheumatoid arthritis ([place])   . Seasonal allergies   . Sleep apnea, obstructive    uses CPAP nightly - had sleep study 08/22/2007    Past Surgical History:  Procedure Laterality Date  . CARPAL TUNNEL RELEASE Right 10/02/2012   Procedure: RIGHT CARPAL TUNNEL RELEASE ENDOSCOPIC;  Surgeon: Jolyn Nap, MD;  Location: [facility];  Service:  Orthopedics;  Laterality: Right;  . CHOLECYSTECTOMY N/A 12/02/2014   Procedure: LAPAROSCOPIC CHOLECYSTECTOMY;  Surgeon: Armandina Gemma, MD;  [facility];  Service: General;  Laterality: N/A;  . HYSTEROSCOPY W/D&C  01-08-2002    There were no vitals filed for this visit.  Subjective Assessment - 11/22/17 1540    Subjective  Pt. noting she wakes up 2-3/night with R shoulder pain.      Patient Stated Goals  "to sleep peacefully t/o the night w/o shoulder pain; lose weight and stop my hips from snapping"    Currently in Pain?  Yes    Pain Score  7     Pain Location  Shoulder    Pain Orientation  Right    Pain Descriptors / Indicators  Dull    Pain Type  Chronic pain    Pain Onset  More than a month ago    Pain Frequency  Constant    Multiple Pain Sites  Yes    Pain Score  0    Pain Location  Hip    Pain Orientation  Right;Left    Pain Descriptors / Indicators  Sharp   "snagging"   Pain Type  Chronic pain  Pain Onset  More than a month ago    Aggravating Factors   walking a lot     Pain Relieving Factors  rest, ibuprofen                        [facility] Adult PT Treatment/Exercise - 11/22/17 1554      Shoulder Exercises: Supine   External Rotation  Right;AAROM;10 reps   heavy cueing for proper motion    External Rotation Limitations  wand    Flexion  Right;AAROM;10 reps    Flexion Limitations  wand     ABduction  Right;AAROM;10 reps      Shoulder Exercises: Seated   Flexion  Right;10 reps;AAROM    Flexion Limitations  red Pball roll out      Electrical Stimulation   Electrical Stimulation Location  L shoulder complex     Electrical Stimulation Action  IFC    Electrical Stimulation Parameters  to tolerance, 15'    Electrical Stimulation Goals  Pain      Vasopneumatic   Number Minutes Vasopneumatic   15 minutes    Vasopnuematic Location   Shoulder   R   Vasopneumatic Pressure  Low    Vasopneumatic Temperature   coldest temp      Manual Therapy    Manual Therapy  Soft tissue mobilization;Passive ROM    Manual therapy comments  Hooklying     Soft tissue mobilization  STM to R shoulder complex    Passive ROM  R shoulder PROM gentle all directions              PT Education - 11/22/17 1818    Education Details  HEP update     Person(s) Educated  Patient    Methods  Explanation;Demonstration;Verbal cues;Handout    Comprehension  Verbalized understanding;Returned demonstration;Verbal cues required;Need further instruction       PT Short Term Goals - 11/22/17 1811      PT SHORT TERM GOAL #1   Title  Patient to be independent with initial HEP    Status  On-going        PT Long Term Goals - 11/22/17 1812      PT LONG TERM GOAL #1   Title  Patient to be independent with advanced HEP.    Status  On-going      PT LONG TERM GOAL #2   Title  Patient to demonstrate [facility] and pain-free R shoulder AROM.    Status  On-going      PT LONG TERM GOAL #3   Title  Patient to demonstrate >/= 4+/5 strength in R shoulder to facilitate functional UE use with daily tasks    Status  On-going      PT LONG TERM GOAL #4   Title  Patient to report tolerance of 1 hour of R sidelying without increased R shoulder pain.    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    Status  On-going            Plan - 11/22/17 1544    Clinical Impression Statement  Pt. very painful in today's session with R shoulder musculature very guarded with PROM and AAROM activities.  Able to tolerated wand AAROM in supine however required heavy cueing with review today.  Pt. with increased pain to end  session at R shoulder at 8/10 and ended visit pain free after ice/compression/E-stim combo to R shoulder.  Pt. noting recent x-rays came back negative and has now been scheduled for MRI after seeing MD earlier today.      Clinical Impairments Affecting Rehab Potential   arthritis, asthma, R carpal tunnel release, DM, GERD, morbid obesity, RA, B achilles tendinitis, acute on chronic R knee pain    PT Treatment/Interventions  ADLs/Self Care Home Management;Cryotherapy;Electrical Stimulation;Iontophoresis 50m/ml Dexamethasone;Moist Heat;Ultrasound;Therapeutic activities;Therapeutic exercise;Manual techniques;Patient/family education;Passive range of motion;Dry needling;Energy conservation;Splinting;Taping;Vasopneumatic Device    Consulted and Agree with Plan of Care  Patient       Patient will benefit from skilled therapeutic intervention in order to improve the following deficits and impairments:  Decreased activity tolerance, Decreased strength, Impaired UE functional use, Pain, Decreased range of motion, Postural dysfunction, Impaired flexibility, Difficulty walking, Abnormal gait  Visit Diagnosis: Acute pain of right shoulder  Stiffness of right shoulder, not elsewhere classified  Muscle weakness (generalized)  Pain in left hip  Pain in right hip  Difficulty in walking, not elsewhere classified     Problem List Patient Active Problem List   Diagnosis Date Noted  . Mild episode of recurrent major depressive disorder ([place] 10/13/2017  . Uncontrolled type 2 diabetes mellitus without complication, with long-term current use of insulin ([facility] 09/05/2017  . Right hip pain 09/04/2017  . Achilles tendinitis of both lower extremities 07/24/2017  . Right shoulder pain 06/01/2017  . 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 Menorrhagia 11/06/2015  . Right ankle pain 06/19/2015  . Second degree uterine prolapse 02/12/2015  . Dyspareunia in female 01/24/2015  . Constipation 12/30/2014  . Urinary retention 12/09/2014  . Non-proliferative diabetic retinopathy ([facility] 04/22/2014  . 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 Seasonal allergies 01/17/2013  . OSA on CPAP   . Low back pain 11/22/2012  . Obesity hypoventilation syndrome ([place] 10/30/2012  .  Lower extremity edema 10/18/2012  . DUB (dysfunctional uterine bleeding) 03/01/2012  . Hypersomnia with sleep apnea   . Asthma 07/06/2011  . Osteoarthritis, knee 02/03/2010  . Morbid obesity with BMI of 50 to 60 07/23/2009  . Genital herpes 01/01/2006  . Uncontrolled type 2 diabetes mellitus with hyperglycemia, with long-term current use of insulin ([facility] 01/01/2006  . Hyperlipidemia 01/01/2006  . GERD 01/01/2006    MBess Harvest PTA 11/22/17 6:19 PM   [facility] [address] [place] [address]Phone: [phone]  Fax:  [phone] Name: Ana MONTELONGOMRN: [id number] of Birth: [birth year]

## 2017-11-22 NOTE — Patient Instructions (Signed)
Continue with the therapy for your shoulder. Pick meloxicam OR the ibuprofen - don't take both. Can take tylenol 575m 1-2 tabs three times a day in addition to this. Ok to use voltaren gel up to 4 times a day. Salon pas patches will help with pain. You can use the nitro for this also in addition to these. Other topical medications may help like aspercreme and capsaicin. We will go ahead with an MRI of this shoulder - i'll contact you with results and next steps.

## 2017-11-23 ENCOUNTER — Encounter: Payer: Self-pay | Admitting: Family Medicine

## 2017-11-23 NOTE — Progress Notes (Signed)
PCP: Ina Homes, MD  Subjective:   HPI: Patient is a 42 y.o. female here for right shoulder pain.  4/8: Patient reports for about 1-1/2 months she has had superior right shoulder pain. Pain is up to a 6 out of 10 in sore, tender. She is tried heat, stretching, ibuprofen 600 mg. Bothers her with sleeping and laying on the right side. No skin changes or numbness.  7/11: Patient returns reporting right shoulder still bothering her quite a bit at 7/10 level, mostly superiorly and posteriorly. Worse lying on right side. Also with new right posterior hip, buttock pain at 9/10 level. Difficult getting out of bed. Pain radiates down leg posteriorly. No back pain. Some numbness in same distribution. No bowel/bladder dysfunction.  8/29: Patient reports her right hip is better.  She's having anterior left hip pain now though. Pain within the groin, worse with walking. Gets a catching with this also. Pain in right shoulder is at 9/10 level, sharp. Worse posterior aspect of shoulder. Did 2 visits of PT and doing some of exercises. Taking ibuprofen, using voltaren gel. No skin changes numbness.  10/1: Patient reports her hips are improved but right shoulder is still very painful. Pain up to 9/10 and sharp superior and lateral right shoulder. Taking mobic or ibuprofen 632m with only mild benefit. Worse trying to pull with this arm. + night pain. Tough getting comfortable. No skin changes, numbness.  Past Medical History:  Diagnosis Date  . Arthritis    major joints  . Asthma    prn inhaler  . Carpal tunnel syndrome of right wrist 09/2012  . Complication of anesthesia    states is hard to wake up post-op  . Diabetes mellitus    intolerant to Metformin  . Dry skin    hands   . Family history of anesthesia complication    mother went into coma during c-section  . GERD (gastroesophageal reflux disease)   . Herpes genital   . Morbid obesity with BMI of 50 to 60 07/23/2009   . Obesity hypoventilation syndrome ([place] 10/30/2012  . Obesity, morbid ([facility] 01/10/2014  . OSA on CPAP     AHI was on  11-17-12 to 120/hr, titrated to 15 cm with 3 cm EPR/   . PCOS (polycystic ovarian syndrome)   . Rheumatoid arthritis ([place]   . Seasonal allergies   . Sleep apnea, obstructive    uses CPAP nightly - had sleep study 08/22/2007    Current Outpatient Medications on File Prior to Visit  Medication Sig Dispense Refill  . acyclovir (ZOVIRAX) 400 MG tablet Take 1 tablet (400 mg total) by mouth 2 (two) times daily. 60 tablet 2  . albuterol (PROAIR HFA) 108 (90 Base) MCG/ACT inhaler Inhale 2 puffs into the lungs every 4 (four) hours as needed for wheezing or shortness of breath (cough). 6.7 g 2  . atorvastatin (LIPITOR) 10 MG tablet Take 1 tablet (10 mg total) by mouth daily. (Patient not taking: Reported on 11/16/2017) 90 tablet 1  . diclofenac (VOLTAREN) 75 MG EC tablet Take 1 tablet (75 mg total) by mouth 2 (two) times daily. 60 tablet 1  . diclofenac sodium (VOLTAREN) 1 % GEL Apply 2 g topically 4 (four) times daily as needed. 100 g 0  . glipiZIDE (GLUCOTROL) 10 MG tablet Take 1 tablet (10 mg total) by mouth 2 (two) times daily before a meal. 60 tablet 3  . glucose blood (ACCU-CHEK SMARTVIEW) test strip TEST THREE TIMES DAILY 150 each 3  .  ibuprofen (ADVIL,MOTRIN) 600 MG tablet Take 1 tablet (600 mg total) by mouth every 8 (eight) hours as needed. 90 tablet 1  . Insulin Glargine (LANTUS SOLOSTAR) 100 UNIT/ML Solostar Pen Inject 73 Units into the skin every morning. 15 mL 6  . Insulin Pen Needle (B-D UF III MINI PEN NEEDLES) 31G X 5 MM MISC USE AS DIRECTED TO INJECT INSULIN TWICE A DAY E11.65 100 each 3  . lactulose (CEPHULAC) 10 g packet Take 1 packet (10 g total) by mouth daily as needed. Reported on 07/29/2015 30 each 0  . Lancets (ACCU-CHEK SOFT TOUCH) lancets Use as instructed 100 each 12  . loratadine (CLARITIN) 10 MG tablet Take 1 tablet (10 mg total) by mouth 2 (two) times  daily. 60 tablet 1  . meloxicam (MOBIC) 15 MG tablet Take 1 tablet (15 mg total) by mouth daily. 30 tablet 2  . methocarbamol (ROBAXIN) 500 MG tablet Take 1 tablet (500 mg total) by mouth every 8 (eight) hours as needed. 60 tablet 1  . mometasone-formoterol (DULERA) 200-5 MCG/ACT AERO Inhale 2 puffs into the lungs 2 (two) times daily. RINSE MOUTH AFTER EACH USE 1 Inhaler 1  . nitroGLYCERIN (NITRODUR - DOSED IN MG/24 HR) 0.2 mg/hr patch Apply 1/4th patch to affected achilles, change daily 30 patch 1  . omeprazole (PRILOSEC) 20 MG capsule Take 1 capsule (20 mg total) by mouth daily before breakfast. 90 capsule 2  . Probiotic Product (PROBIOTIC DAILY PO) Take 2 capsules by mouth every evening.     . sitaGLIPtin (JANUVIA) 100 MG tablet Take 1 tablet (100 mg total) by mouth daily. 30 tablet 3  . TURMERIC PO Take 2 tablets by mouth at bedtime. Takes generic turmeric     No current facility-administered medications on file prior to visit.     Past Surgical History:  Procedure Laterality Date  . CARPAL TUNNEL RELEASE Right 10/02/2012   Procedure: RIGHT CARPAL TUNNEL RELEASE ENDOSCOPIC;  Surgeon: Jolyn Nap, MD;  Location: [facility];  Service: Orthopedics;  Laterality: Right;  . CHOLECYSTECTOMY N/A 12/02/2014   Procedure: LAPAROSCOPIC CHOLECYSTECTOMY;  Surgeon: Armandina Gemma, MD;  Location: [facility];  Service: General;  Laterality: N/A;  . HYSTEROSCOPY W/D&C  01-08-2002    Allergies  Allergen Reactions  . Citrus Hives and Itching  . Latex Swelling  . Penicillins Hives and Itching  . Soap Hives, Itching and Swelling    PUREX LAUNDRY DETERGENT  . Tomato Hives and Itching  . Fluticasone     Per patient, makes sneeze and gag  . Hydrocodone Itching and Nausea Only  . Other Itching    Chocolate, Bananas, Acidic foods  . Shellfish Allergy   . Doxycycline Other (See Comments)    AFFECTS JOINTS  . Metformin And Related Rash    GI UPSET    Social History   Socioeconomic  History  . Marital status: Divorced    Spouse name: Not on file  . Number of children: 2  . Years of education: 16  . Highest education level: Not on file  Occupational History  . Occupation: UNEMPLOYED    Employer: UNEMPLOYED  Social Needs  . Financial resource strain: Not on file  . Food insecurity:    Worry: Not on file    Inability: Not on file  . Transportation needs:    Medical: Not on file    Non-medical: Not on file  Tobacco Use  . Smoking status: Former Smoker    Years: 10.00  Last attempt to quit: 05/24/2010    Years since quitting: 7.5  . Smokeless tobacco: Never Used  Substance and Sexual Activity  . Alcohol use: Yes    Alcohol/week: 0.0 standard drinks    Comment: rarely  . Drug use: No  . Sexual activity: Yes    Birth control/protection: None    Comment: Lives w/a partner  Lifestyle  . Physical activity:    Days per week: Not on file    Minutes per session: Not on file  . Stress: Not on file  Relationships  . Social connections:    Talks on phone: Not on file    Gets together: Not on file    Attends religious service: Not on file    Active member of club or organization: Not on file    Attends meetings of clubs or organizations: Not on file    Relationship status: Not on file  . Intimate partner violence:    Fear of current or ex partner: Not on file    Emotionally abused: Not on file    Physically abused: Not on file    Forced sexual activity: Not on file  Other Topics Concern  . Not on file  Social History Narrative   Single, but lives with a partner   Has 2 children and cares for an infant during the day          Family History  Problem Relation Age of Onset  . Other Mother   . Leukemia Cousin   . Other Other   . Obesity Other   . Other Other   . Diabetes Maternal Aunt   . Heart attack Maternal Aunt   . Alzheimer's disease Maternal Uncle   . Cancer Maternal Uncle        prostate  . Diabetes Paternal Aunt   . Cancer Paternal Aunt         brain,mouth  . Alzheimer's disease Paternal Aunt   . Diabetes Paternal Uncle   . Diabetes Maternal Grandmother   . Diabetes Maternal Grandfather     BP (!) 143/81   Pulse (!) 106   Ht 5' 2"  (1.575 m)   Wt (!) 307 lb (139.3 kg)   LMP 11/10/2017 (Exact Date)   BMI 56.15 kg/m   Review of Systems: See HPI above.     Objective:  Physical Exam:  Gen: NAD, comfortable in exam room  Right shoulder: No swelling, ecchymoses.  No gross deformity. TTP right trapezius, lateral shoulder. FROM passively, unable to actively abduct past 90 degrees due to pain. Negative Hawkins, Neers. Negative Yergasons. Strength 5-/5 with empty can, painful.  Strength 5/5 resisted internal/external rotation.  Negative apprehension. NV intact distally.  Assessment & Plan:  1.  Right shoulder pain:  Not improving with conservative treatment including HEP, tylenol, meloxicam or ibuprofen, robaxin.  Encouraged to continue with physical therapy.  Voltaren gel, salon pas patches discussed.  Given lack of improvement, severity of pain, patient will get MRI of this shoulder to assess for partial rotator cuff tear.  Can use nitro patches as well.

## 2017-11-24 ENCOUNTER — Telehealth: Payer: Self-pay | Admitting: Neurology

## 2017-11-24 ENCOUNTER — Encounter (INDEPENDENT_AMBULATORY_CARE_PROVIDER_SITE_OTHER): Payer: Medicaid Other | Admitting: Ophthalmology

## 2017-11-24 NOTE — Telephone Encounter (Signed)
I called pt. Advised she would have to be seen in the office first. She has not been seen since 2016. I scheduled her an appt with Dr. Brett Fairy on 03/14/18 at [place] we will place her on cx list to try and get her in sooner. I did express importance of making sure she f/u regularly. She verbalized understanding.

## 2017-11-24 NOTE — Telephone Encounter (Signed)
I reviewed pt chart. She has not been seen since 01/13/15. She saw Jinny Blossom M,NP at that time.

## 2017-11-24 NOTE — Telephone Encounter (Signed)
Pt is in need of new cpap equipment and RX. Please call pt at [phone]. Pt states the new RX needs to be sent to [facility] [phone] EXT [phone] or [place] [phone]

## 2017-11-25 ENCOUNTER — Ambulatory Visit: Payer: Medicaid Other

## 2017-11-25 DIAGNOSIS — M25611 Stiffness of right shoulder, not elsewhere classified: Secondary | ICD-10-CM

## 2017-11-25 DIAGNOSIS — R262 Difficulty in walking, not elsewhere classified: Secondary | ICD-10-CM

## 2017-11-25 DIAGNOSIS — M25511 Pain in right shoulder: Secondary | ICD-10-CM | POA: Diagnosis not present

## 2017-11-25 DIAGNOSIS — M25552 Pain in left hip: Secondary | ICD-10-CM

## 2017-11-25 DIAGNOSIS — M25551 Pain in right hip: Secondary | ICD-10-CM

## 2017-11-25 DIAGNOSIS — M6281 Muscle weakness (generalized): Secondary | ICD-10-CM

## 2017-11-25 NOTE — Therapy (Signed)
[facility] [address]  [place], [place], [address] Phone: [phone]   Fax:  [phone]  Physical Therapy Treatment  Patient Details  Name: Ana Long MRN: [id number] Date of Birth: September 22, 1975 Referring Provider (PT): Karlton Lemon, MD   Encounter Date: 11/25/2017  PT End of Session - 11/25/17 0942    Visit Number  3    Number of Visits  13    Date for PT Re-Evaluation  12/30/17    Authorization Type  Medicaid    Authorization Time Period  10.1.20- 11.11.19    Authorization - Visit Number  2    Authorization - Number of Visits  12    PT Start Time  0932    PT Stop Time  1030    PT Time Calculation (min)  58 min    Activity Tolerance  Patient tolerated treatment well;No increased pain    Behavior During Therapy  WFL for tasks assessed/performed       Past Medical History:  Diagnosis Date  . Arthritis    major joints  . Asthma    prn inhaler  . Carpal tunnel syndrome of right wrist 09/2012  . Complication of anesthesia    states is hard to wake up post-op  . Diabetes mellitus    intolerant to Metformin  . Dry skin    hands   . Family history of anesthesia complication    mother went into coma during c-section  . GERD (gastroesophageal reflux disease)   . Herpes genital   . Morbid obesity with BMI of 50 to 60 07/23/2009  . Obesity hypoventilation syndrome ([place]) 10/30/2012  . Obesity, morbid ([place]) 01/10/2014  . OSA on CPAP     AHI was on  11-17-12 to 120/hr, titrated to 15 cm with 3 cm EPR/   . PCOS (polycystic ovarian syndrome)   . Rheumatoid arthritis ([place])   . Seasonal allergies   . Sleep apnea, obstructive    uses CPAP nightly - had sleep study 08/22/2007    Past Surgical History:  Procedure Laterality Date  . CARPAL TUNNEL RELEASE Right 10/02/2012   Procedure: RIGHT CARPAL TUNNEL RELEASE ENDOSCOPIC;  Surgeon: Jolyn Nap, MD;  Location: [facility];  Service:  Orthopedics;  Laterality: Right;  . CHOLECYSTECTOMY N/A 12/02/2014   Procedure: LAPAROSCOPIC CHOLECYSTECTOMY;  Surgeon: Armandina Gemma, MD;  [facility];  Service: General;  Laterality: N/A;  . HYSTEROSCOPY W/D&C  01-08-2002    There were no vitals filed for this visit.  Subjective Assessment - 11/25/17 0937    Subjective  Pt. noting she has not been sleeping well due to R shoulder pain.      Patient Stated Goals  "to sleep peacefully t/o the night w/o shoulder pain; lose weight and stop my hips from snapping"    Currently in Pain?  Yes    Pain Score  2     Pain Location  Shoulder    Pain Orientation  Right    Pain Descriptors / Indicators  Dull    Pain Type  Chronic pain    Pain Radiating Towards  intermittent "pulling" or "tingling" down R to elbow     Pain Onset  More than a month ago    Pain Frequency  Constant    Multiple Pain Sites  No    Pain Score  --   B hips rising to 9/10 at worst with curb navigation    Pain  Location  Hip    Pain Orientation  Right;Left    Pain Descriptors / Indicators  Sharp    Pain Type  Chronic pain    Pain Onset  More than a month ago    Aggravating Factors   walking a lot, navigating curbs    Pain Relieving Factors  rest, ibuprofen                        [facility] Adult PT Treatment/Exercise - 11/25/17 0949      Shoulder Exercises: Supine   External Rotation  Right;AAROM;15 reps    External Rotation Limitations  wand    Flexion  Right;AAROM;15 reps    Flexion Limitations  wand     Other Supine Exercises  R shoulder supine (slight prop up elevation) AROM x 10 reps       Shoulder Exercises: Seated   External Rotation  Right;10 reps;Theraband   Some R shoulder pain increase at end range ER   Theraband Level (Shoulder External Rotation)  Level 1 (Yellow)    External Rotation Limitations  squeezing pillow by side     Internal Rotation  Right;10 reps;Strengthening;Theraband    Theraband Level (Shoulder Internal Rotation)  Level  1 (Yellow)    Internal Rotation Limitations  squeezing pillow  by side     Flexion  Right;AAROM;15 reps    Flexion Limitations  orange p-ball rollout on mat table with pt. seated in chair     Abduction  Right;15 reps;AAROM    ABduction Limitations  scaption; orange p-ball rollout on mat table with pt. seated in chair       Shoulder Exercises: Pulleys   Flexion  3 minutes   well tolerated    Scaption  3 minutes   well tolerated      Shoulder Exercises: Stretch   Other Shoulder Stretches  R UT stretch with theraspist overpressure on shoulder x 30 sec    pt. noted good relief following this    Other Shoulder Stretches  R Lev scap. stretch with therapist overpressure on shoulder x 30 sec    pt. noted good relief following this      Vasopneumatic   Number Minutes Vasopneumatic   15 minutes    Vasopnuematic Location   Shoulder    Vasopneumatic Pressure  Low    Vasopneumatic Temperature   coldest temp               PT Short Term Goals - 11/25/17 0951      PT SHORT TERM GOAL #1   Title  Patient to be independent with initial HEP    Status  Achieved        PT Long Term Goals - 11/22/17 1812      PT LONG TERM GOAL #1   Title  Patient to be independent with advanced HEP.    Status  On-going      PT LONG TERM GOAL #2   Title  Patient to demonstrate [facility] and pain-free R shoulder AROM.    Status  On-going      PT LONG TERM GOAL #3   Title  Patient to demonstrate >/= 4+/5 strength in R shoulder to facilitate functional UE use with daily tasks    Status  On-going      PT LONG TERM GOAL #4   Title  Patient to report tolerance of 1 hour of R sidelying without increased R shoulder pain.    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    Status  On-going            Plan - 11/25/17 0955    Clinical Impression Statement  Pt. seen today noting her shoulder was sore  following last visit and MD f/u which she had on same day three days ago.  Session focused on gentle ROM and strengthening activities to pt. tolerance.  Pt. requiring vc's to keep proper technique today and to avoid painful arc of motion.  Ended visit with ice/compression to R shoulder per pt. request as she noted good pain relief from this last session.      PT Treatment/Interventions  ADLs/Self Care Home Management;Cryotherapy;Electrical Stimulation;Iontophoresis 74m/ml Dexamethasone;Moist Heat;Ultrasound;Therapeutic activities;Therapeutic exercise;Manual techniques;Patient/family education;Passive range of motion;Dry needling;Energy conservation;Splinting;Taping;Vasopneumatic Device    Consulted and Agree with Plan of Care  Patient       Patient will benefit from skilled therapeutic intervention in order to improve the following deficits and impairments:  Decreased activity tolerance, Decreased strength, Impaired UE functional use, Pain, Decreased range of motion, Postural dysfunction, Impaired flexibility, Difficulty walking, Abnormal gait  Visit Diagnosis: Acute pain of right shoulder  Stiffness of right shoulder, not elsewhere classified  Muscle weakness (generalized)  Pain in left hip  Pain in right hip  Difficulty in walking, not elsewhere classified     Problem List Patient Active Problem List   Diagnosis Date Noted  . Mild episode of recurrent major depressive disorder ([facility] 10/13/2017  . Uncontrolled type 2 diabetes mellitus without complication, with long-term current use of insulin ([facility] 09/05/2017  . Right hip pain 09/04/2017  . Achilles tendinitis of both lower extremities 07/24/2017  . Right shoulder pain 06/01/2017  . 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 Menorrhagia 11/06/2015  . Right ankle pain 06/19/2015  . Second degree uterine prolapse 02/12/2015  . Dyspareunia in female 01/24/2015  . Constipation 12/30/2014  . Urinary retention 12/09/2014  .  Non-proliferative diabetic retinopathy ([place] 04/22/2014  . 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 Seasonal allergies 01/17/2013  . OSA on CPAP   . Low back pain 11/22/2012  . Obesity hypoventilation syndrome ([place] 10/30/2012  . Lower extremity edema 10/18/2012  . DUB (dysfunctional uterine bleeding) 03/01/2012  . Hypersomnia with sleep apnea   . Asthma 07/06/2011  . Osteoarthritis, knee 02/03/2010  . Morbid obesity with BMI of 50 to 60 07/23/2009  . Genital herpes 01/01/2006  . Uncontrolled type 2 diabetes mellitus with hyperglycemia, with long-term current use of insulin ([place] 01/01/2006  . Hyperlipidemia 01/01/2006  . GERD 01/01/2006    MBess Harvest PTA 11/25/17 12:52 PM   [facility] [address] [place] [address]Phone: [phone]  Fax:  [phone] Name: AGRAYCIE HALLEYMRN: [id number] of Birth: 1May 18, 1977

## 2017-11-28 ENCOUNTER — Encounter: Payer: Self-pay | Admitting: Internal Medicine

## 2017-11-28 ENCOUNTER — Encounter: Payer: Medicaid Other | Admitting: Internal Medicine

## 2017-11-28 NOTE — Progress Notes (Deleted)
   CC: F/U Diabetes Mellitus   HPI:  Ms.Bettyanne A Snowden-Ward is a 42 y.o. female who presented to the clinic for continued evaluation and management of her chronic medical illnesses. For a detailed assessment and plan please refer to problem based charting below.   Past Medical History:  Diagnosis Date  . Arthritis    major joints  . Asthma    prn inhaler  . Carpal tunnel syndrome of right wrist 09/2012  . Complication of anesthesia    states is hard to wake up post-op  . Diabetes mellitus    intolerant to Metformin  . Dry skin    hands   . Family history of anesthesia complication    mother went into coma during c-section  . GERD (gastroesophageal reflux disease)   . Herpes genital   . Morbid obesity with BMI of 50 to 60 07/23/2009  . Obesity hypoventilation syndrome ([place]) 10/30/2012  . Obesity, morbid ([place]) 01/10/2014  . OSA on CPAP     AHI was on  11-17-12 to 120/hr, titrated to 15 cm with 3 cm EPR/   . PCOS (polycystic ovarian syndrome)   . Rheumatoid arthritis ([place])   . Seasonal allergies   . Sleep apnea, obstructive    uses CPAP nightly - had sleep study 08/22/2007   Review of Systems:  12 point ROS preformed. All negative aside from those mentioned in the HPI.  Physical Exam: There were no vitals filed for this visit. General: Well nourished *** in no acute distress Pulm: Good air movement with no wheezing or crackles  CV: RRR, no murmurs, no rubs  Abdomen: Active bowel sounds, soft, non-distended, no tenderness to palpation  Extremities: No LE edema   Assessment & Plan:   See Encounters Tab for problem based charting.  Patient {GC/GE:[id number]::"discussed with","seen with"} Dr. {NAMES:[id number]::"Butcher","Granfortuna","E. Hoffman","Klima","Mullen","Narendra","Raines","Vincent"}

## 2017-11-29 ENCOUNTER — Ambulatory Visit: Payer: Medicaid Other | Admitting: Physical Therapy

## 2017-11-29 ENCOUNTER — Encounter: Payer: Self-pay | Admitting: *Deleted

## 2017-11-29 DIAGNOSIS — M25511 Pain in right shoulder: Secondary | ICD-10-CM | POA: Diagnosis not present

## 2017-11-29 DIAGNOSIS — R262 Difficulty in walking, not elsewhere classified: Secondary | ICD-10-CM

## 2017-11-29 DIAGNOSIS — M25551 Pain in right hip: Secondary | ICD-10-CM

## 2017-11-29 DIAGNOSIS — M25611 Stiffness of right shoulder, not elsewhere classified: Secondary | ICD-10-CM

## 2017-11-29 DIAGNOSIS — M25552 Pain in left hip: Secondary | ICD-10-CM

## 2017-11-29 DIAGNOSIS — M6281 Muscle weakness (generalized): Secondary | ICD-10-CM

## 2017-11-29 NOTE — Therapy (Signed)
[facility] [address]  [place], [place], [address] Phone: [phone]   Fax:  [phone]  Physical Therapy Treatment  Patient Details  Name: Ana Long MRN: [id number] Date of Birth: 04-15-1975 Referring Provider (PT): Karlton Lemon, MD   Encounter Date: 11/29/2017  PT End of Session - 11/29/17 1702    Visit Number  4    Number of Visits  13    Date for PT Re-Evaluation  12/30/17    Authorization Type  Medicaid    Authorization Time Period  10.1.80- 11.11.19    Authorization - Visit Number  3    Authorization - Number of Visits  12    PT Start Time  1536   pt late   PT Stop Time  1615    PT Time Calculation (min)  39 min    Activity Tolerance  Patient tolerated treatment well    Behavior During Therapy  WFL for tasks assessed/performed       Past Medical History:  Diagnosis Date  . Arthritis    major joints  . Asthma    prn inhaler  . Carpal tunnel syndrome of right wrist 09/2012  . Complication of anesthesia    states is hard to wake up post-op  . Diabetes mellitus    intolerant to Metformin  . Dry skin    hands   . Family history of anesthesia complication    mother went into coma during c-section  . GERD (gastroesophageal reflux disease)   . Herpes genital   . Morbid obesity with BMI of 50 to 60 07/23/2009  . Obesity hypoventilation syndrome ([place]) 10/30/2012  . Obesity, morbid ([place]) 01/10/2014  . OSA on CPAP     AHI was on  11-17-12 to 120/hr, titrated to 15 cm with 3 cm EPR/   . PCOS (polycystic ovarian syndrome)   . Rheumatoid arthritis ([place])   . Seasonal allergies   . Sleep apnea, obstructive    uses CPAP nightly - had sleep study 08/22/2007    Past Surgical History:  Procedure Laterality Date  . CARPAL TUNNEL RELEASE Right 10/02/2012   Procedure: RIGHT CARPAL TUNNEL RELEASE ENDOSCOPIC;  Surgeon: Jolyn Nap, MD;  Location: [facility];  Service:  Orthopedics;  Laterality: Right;  . CHOLECYSTECTOMY N/A 12/02/2014   Procedure: LAPAROSCOPIC CHOLECYSTECTOMY;  Surgeon: Armandina Gemma, MD;  Location: [facility];  Service: General;  Laterality: N/A;  . HYSTEROSCOPY W/D&C  01-08-2002    There were no vitals filed for this visit.  Subjective Assessment - 11/29/17 1700    Subjective  Pt relays she is tired today, her shoulder feels okay at rest but still has pain with reaching    Patient Stated Goals  "to sleep peacefully t/o the night w/o shoulder pain; lose weight and stop my hips from snapping"    Currently in Pain?  Yes    Pain Score  2     Pain Location  Shoulder    Pain Orientation  Right    Pain Type  Chronic pain                       [facility] Adult PT Treatment/Exercise - 11/29/17 1701      Shoulder Exercises: Supine   External Rotation  Right;AAROM;15 reps    External Rotation Limitations  wand    Flexion  Right;AAROM;15 reps    Flexion Limitations  wand  ABduction  AAROM;Right;15 reps    Other Supine Exercises  R shoulder supine (slight prop up elevation)      Shoulder Exercises: Seated   External Rotation  Right;10 reps;Theraband   Some R shoulder pain increase at end range ER   Theraband Level (Shoulder External Rotation)  Level 1 (Yellow)    External Rotation Limitations  squeezing pillow by side     Internal Rotation  Right;10 reps;Strengthening;Theraband    Theraband Level (Shoulder Internal Rotation)  Level 1 (Yellow)    Internal Rotation Limitations  squeezing pillow  by side       Shoulder Exercises: Pulleys   Flexion  3 minutes   well tolerated    Scaption  3 minutes   well tolerated      Shoulder Exercises: Stretch   Other Shoulder Stretches  R UT stretch with theraspist overpressure on shoulder x 30 sec    pt. noted good relief following this    Other Shoulder Stretches  R Lev scap. stretch with therapist overpressure on shoulder x 30 sec    pt. noted good relief following this       Vasopneumatic   Number Minutes Vasopneumatic   15 minutes    Vasopnuematic Location   Shoulder    Vasopneumatic Pressure  Low    Vasopneumatic Temperature   coldest temp               PT Short Term Goals - 11/25/17 0951      PT SHORT TERM GOAL #1   Title  Patient to be independent with initial HEP    Status  Achieved        PT Long Term Goals - 11/22/17 1812      PT LONG TERM GOAL #1   Title  Patient to be independent with advanced HEP.    Status  On-going      PT LONG TERM GOAL #2   Title  Patient to demonstrate [facility] and pain-free R shoulder AROM.    Status  On-going      PT LONG TERM GOAL #3   Title  Patient to demonstrate >/= 4+/5 strength in R shoulder to facilitate functional UE use with daily tasks    Status  On-going      PT LONG TERM GOAL #4   Title  Patient to report tolerance of 1 hour of R sidelying without increased R shoulder pain.    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    Status  On-going            Plan - 11/29/17 1705    Clinical Impression Statement  Pt treated with therex for shoulder stretching and strengthening with focus on AAROM as she still has pain and difficulty with AROM. She again recieved vasonpnuematic at end as she relays this is helping her pain and inflammaiton.     Rehab Potential  Fair    Clinical Impairments Affecting Rehab Potential  arthritis, asthma, R carpal tunnel release, DM, GERD, morbid obesity, RA, B achilles tendinitis, acute on chronic R knee pain    PT Frequency  2x / week    PT Duration  6 weeks    PT Treatment/Interventions  ADLs/Self Care Home Management;Cryotherapy;Electrical Stimulation;Iontophoresis 43m/ml Dexamethasone;Moist Heat;Ultrasound;Therapeutic activities;Therapeutic exercise;Manual techniques;Patient/family education;Passive range of motion;Dry needling;Energy  conservation;Splinting;Taping;Vasopneumatic Device    PT  Next Visit Plan  Review prior HEP & update as indicated    Consulted and Agree with Plan of Care  Patient       Patient will benefit from skilled therapeutic intervention in order to improve the following deficits and impairments:  Decreased activity tolerance, Decreased strength, Impaired UE functional use, Pain, Decreased range of motion, Postural dysfunction, Impaired flexibility, Difficulty walking, Abnormal gait  Visit Diagnosis: Acute pain of right shoulder  Stiffness of right shoulder, not elsewhere classified  Muscle weakness (generalized)  Pain in left hip  Pain in right hip  Difficulty in walking, not elsewhere classified     Problem List Patient Active Problem List   Diagnosis Date Noted  . Mild episode of recurrent major depressive disorder ([place]) 10/13/2017  . Uncontrolled type 2 diabetes mellitus without complication, with long-term current use of insulin ([place]) 09/05/2017  . Right hip pain 09/04/2017  . Achilles tendinitis of both lower extremities 07/24/2017  . Right shoulder pain 06/01/2017  . 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 Menorrhagia 11/06/2015  . Right ankle pain 06/19/2015  . Second degree uterine prolapse 02/12/2015  . Dyspareunia in female 01/24/2015  . Constipation 12/30/2014  . Urinary retention 12/09/2014  . Non-proliferative diabetic retinopathy ([place]) 04/22/2014  . 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 Seasonal allergies 01/17/2013  . OSA on CPAP   . Low back pain 11/22/2012  . Obesity hypoventilation syndrome ([place]) 10/30/2012  . Lower extremity edema 10/18/2012  . DUB (dysfunctional uterine bleeding) 03/01/2012  . Hypersomnia with sleep apnea   . Asthma 07/06/2011  . Osteoarthritis, knee 02/03/2010  . Morbid obesity with BMI of 50 to 60 07/23/2009  . Genital herpes 01/01/2006  . Uncontrolled type 2 diabetes mellitus with hyperglycemia, with long-term current use of  insulin ([place]) 01/01/2006  . Hyperlipidemia 01/01/2006  . GERD 01/01/2006    Debbe Odea, PT, DPT 11/29/2017, 5:11 PM  [facility] [address]  [place], [place], [address] Phone: [phone]   Fax:  [phone]  Name: LAMANDA RUDDER MRN: [id number] Date of Birth: [birth year]

## 2017-12-01 ENCOUNTER — Encounter: Payer: Self-pay | Admitting: *Deleted

## 2017-12-01 NOTE — Addendum Note (Signed)
Addended by: Sherrie George F on: 12/01/2017 07:57 AM   Modules accepted: Orders

## 2017-12-05 ENCOUNTER — Encounter: Payer: Medicaid Other | Admitting: Physical Therapy

## 2017-12-08 ENCOUNTER — Ambulatory Visit: Payer: Medicaid Other | Admitting: Physical Therapy

## 2017-12-08 ENCOUNTER — Encounter: Payer: Self-pay | Admitting: Physical Therapy

## 2017-12-08 DIAGNOSIS — M25552 Pain in left hip: Secondary | ICD-10-CM

## 2017-12-08 DIAGNOSIS — M25611 Stiffness of right shoulder, not elsewhere classified: Secondary | ICD-10-CM

## 2017-12-08 DIAGNOSIS — M25511 Pain in right shoulder: Secondary | ICD-10-CM | POA: Diagnosis not present

## 2017-12-08 DIAGNOSIS — R262 Difficulty in walking, not elsewhere classified: Secondary | ICD-10-CM

## 2017-12-08 DIAGNOSIS — M25551 Pain in right hip: Secondary | ICD-10-CM

## 2017-12-08 DIAGNOSIS — M6281 Muscle weakness (generalized): Secondary | ICD-10-CM

## 2017-12-08 NOTE — Therapy (Addendum)
[facility] [address]  [place], [place], [address] Phone: [phone]   Fax:  [phone]  Physical Therapy Treatment / Discharge Summary  Patient Details  Name: Ana Long MRN: [id number] Date of Birth: 11/13/1975 Referring Provider (PT): Karlton Lemon, MD   Encounter Date: 12/08/2017  PT End of Session - 12/08/17 1108    Visit Number  5    Number of Visits  13    Date for PT Re-Evaluation  12/30/17    Authorization Type  Medicaid    Authorization Time Period  11/22/17- 01/02/18    Authorization - Visit Number  4    Authorization - Number of Visits  12    PT Start Time  1108   pt arrived late   PT Stop Time  1205    PT Time Calculation (min)  57 min    Activity Tolerance  Patient tolerated treatment well    Behavior During Therapy  [facility] for tasks assessed/performed       Past Medical History:  Diagnosis Date  . Arthritis    major joints  . Asthma    prn inhaler  . Carpal tunnel syndrome of right wrist 09/2012  . Complication of anesthesia    states is hard to wake up post-op  . Diabetes mellitus    intolerant to Metformin  . Dry skin    hands   . Family history of anesthesia complication    mother went into coma during c-section  . GERD (gastroesophageal reflux disease)   . Herpes genital   . Morbid obesity with BMI of 50 to 60 07/23/2009  . Obesity hypoventilation syndrome ([place]) 10/30/2012  . Obesity, morbid ([place]) 01/10/2014  . OSA on CPAP     AHI was on  11-17-12 to 120/hr, titrated to 15 cm with 3 cm EPR/   . PCOS (polycystic ovarian syndrome)   . Rheumatoid arthritis ([place])   . Seasonal allergies   . Sleep apnea, obstructive    uses CPAP nightly - had sleep study 08/22/2007    Past Surgical History:  Procedure Laterality Date  . CARPAL TUNNEL RELEASE Right 10/02/2012   Procedure: RIGHT CARPAL TUNNEL RELEASE ENDOSCOPIC;  Surgeon: Jolyn Nap, MD;  Location: [facility];  Service: Orthopedics;  Laterality: Right;  . CHOLECYSTECTOMY N/A 12/02/2014   Procedure: LAPAROSCOPIC CHOLECYSTECTOMY;  Surgeon: Armandina Gemma, MD;  [facility];  Service: General;  Laterality: N/A;  . HYSTEROSCOPY W/D&C  01-08-2002    There were no vitals filed for this visit.  Subjective Assessment - 12/08/17 1111    Subjective  Pt reports pain in shoulders is becoming more intermittent. Bakc & hip pain are associated with muscle spasms in back and abdominal.    Patient Stated Goals  "to sleep peacefully t/o the night w/o shoulder pain; lose weight and stop my hips from snapping"    Currently in Pain?  Yes    Pain Score  5     Pain Location  Shoulder    Pain Orientation  Right    Pain Descriptors / Indicators  Nagging   & pulling   Pain Type  Chronic pain    Pain Frequency  Intermittent    Pain Score  8    Pain Location  Back    Pain Orientation  Lower;Right;Left    Pain Descriptors / Indicators  Sharp;Numbness    Pain Type  Chronic pain    Pain Frequency  Intermittent    Pain Score  4    Pain Location  Hip    Pain Orientation  Left    Pain Descriptors / Indicators  Aching   & pulling   Pain Type  Chronic pain    Pain Frequency  Intermittent                       [facility] Adult PT Treatment/Exercise - 12/08/17 1108      Exercises   Exercises  Shoulder;Lumbar;Knee/Hip      Lumbar Exercises: Seated   Long Arc Quad on Chair  Right;Left;10 reps    Other Seated Lumbar Exercises  R/L pallof press with red TB (seated on edge of mat table) x 10 each - cues for abd bracing      Knee/Hip Exercises: Stretches   Piriformis Stretch  Right;Left;30 seconds;2 reps    Piriformis Stretch Limitations  seated figure 4 with leg rotated on mat table + fwd lean at hips      Knee/Hip Exercises: Seated   Clamshell with TheraBand  Red   alt hip ABD/ER with red TB x x10   Marching  Right;Left;10 reps    Marching Limitations  on edge of mat table - cues for abd  bracing      Shoulder Exercises: Seated   Extension  Both;10 reps;Theraband;Strengthening    Theraband Level (Shoulder Extension)  Level 2 (Red)    Extension Limitations  cues for scap retraction & abdominal bracing    Row  Both;10 reps;Theraband;Strengthening    Theraband Level (Shoulder Row)  Level 2 (Red)    Row Limitations  cues for scap retraction & abdominal bracing      Modalities   Modalities  Cryotherapy;Vasopneumatic      Cryotherapy   Number Minutes Cryotherapy  10 Minutes    Cryotherapy Location  Lumbar Spine    Type of Cryotherapy  Ice pack      Vasopneumatic   Number Minutes Vasopneumatic   10 minutes    Vasopnuematic Location   Shoulder   Rt   Vasopneumatic Pressure  Low    Vasopneumatic Temperature   coldest temp             PT Education - 12/08/17 1145    Education Details  HEP update    Person(s) Educated  Patient    Methods  Explanation;Demonstration;Handout    Comprehension  Verbalized understanding;Returned demonstration;Need further instruction       PT Short Term Goals - 11/25/17 0951      PT SHORT TERM GOAL #1   Title  Patient to be independent with initial HEP    Status  Achieved        PT Long Term Goals - 11/22/17 1812      PT LONG TERM GOAL #1   Title  Patient to be independent with advanced HEP.    Status  On-going      PT LONG TERM GOAL #2   Title  Patient to demonstrate [facility] and pain-free R shoulder AROM.    Status  On-going      PT LONG TERM GOAL #3   Title  Patient to demonstrate >/= 4+/5 strength in R shoulder to facilitate functional UE use with daily tasks    Status  On-going      PT LONG TERM GOAL #4   Title  Patient to report tolerance of 1 hour of R sidelying without increased R shoulder pain.    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    Status  On-going            Plan - 12/08/17 1200    Clinical  Impression Statement  Ana Long reporting R shoulder pain becoming less intense and more intermittent, with back and hip pain more of an issue at present. Shifted focus of stretching and therapeutic exercises to encompass more low back and hip strengthening while also working on scapular activation/stabilization - all exercises performed in sitting as pt reports poor tolerance for supine due to size and limited tolerance for standing. Good tolerance for most exercises other than some fatigue noted, therefore update HEP to include more back & hip focused exercises.    Rehab Potential  Fair    Clinical Impairments Affecting Rehab Potential  arthritis, asthma, R carpal tunnel release, DM, GERD, morbid obesity, RA, B achilles tendinitis, acute on chronic R knee pain    PT Treatment/Interventions  ADLs/Self Care Home Management;Cryotherapy;Electrical Stimulation;Iontophoresis 47m/ml Dexamethasone;Moist Heat;Ultrasound;Therapeutic activities;Therapeutic exercise;Manual techniques;Patient/family education;Passive range of motion;Dry needling;Energy conservation;Splinting;Taping;Vasopneumatic Device    Consulted and Agree with Plan of Care  Patient       Patient will benefit from skilled therapeutic intervention in order to improve the following deficits and impairments:  Decreased activity tolerance, Decreased strength, Impaired UE functional use, Pain, Decreased range of motion, Postural dysfunction, Impaired flexibility, Difficulty walking, Abnormal gait  Visit Diagnosis: Acute pain of right shoulder  Stiffness of right shoulder, not elsewhere classified  Muscle weakness (generalized)  Pain in left hip  Pain in right hip  Difficulty in walking, not elsewhere classified     Problem List Patient Active Problem List   Diagnosis Date Noted  . Mild episode of recurrent major depressive disorder ([place] 10/13/2017  . Uncontrolled type 2 diabetes mellitus without complication, with long-term current use  of insulin ([place] 09/05/2017  . Right hip pain 09/04/2017  . Achilles tendinitis of both lower extremities 07/24/2017  . Right shoulder pain 06/01/2017  . 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 Menorrhagia 11/06/2015  . Right ankle pain 06/19/2015  . Second degree uterine prolapse 02/12/2015  . Dyspareunia in female 01/24/2015  . Constipation 12/30/2014  . Urinary retention 12/09/2014  . Non-proliferative diabetic retinopathy ([place] 04/22/2014  . 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 Seasonal allergies 01/17/2013  . OSA on CPAP   . Low back pain 11/22/2012  . Obesity hypoventilation syndrome ([place] 10/30/2012  . Lower extremity edema 10/18/2012  . DUB (dysfunctional uterine bleeding) 03/01/2012  . Hypersomnia with sleep apnea   . Asthma 07/06/2011  . Osteoarthritis, knee 02/03/2010  . Morbid obesity with BMI of 50 to 60 07/23/2009  . Genital herpes 01/01/2006  . Uncontrolled type 2 diabetes mellitus with hyperglycemia, with long-term current use of insulin ([place] 01/01/2006  . Hyperlipidemia 01/01/2006  . GERD 01/01/2006    JPercival Spanish PT, MPT 12/08/2017, 12:18 PM  [facility] [place] [address]Phone: [phone]  Fax:  [phone] Name: Ana LANZOMRN: [id number] of Birth: [birth year] PHYSICAL THERAPY DISCHARGE SUMMARY  Visits from Start of Care: 5  Current functional level related to goals / functional outcomes:   Refer to above clinical impression for status as of last visit on 12/08/17. Pt failed to schedule any  further visit within the next 30 days, therefore will proceed with discharge from PT for this episode.   Remaining deficits:   Unable to formally assess as above.   Education / Equipment:   HEP  Plan: Patient agrees to discharge.  Patient goals were partially met. Patient is being discharged due to not returning since the last visit.   ?????     Percival Spanish, PT, MPT 01/24/18, 3:20 PM  [facility] [address]  [place], [place], [address] Phone: [phone]   Fax:  [phone]

## 2017-12-13 NOTE — Progress Notes (Addendum)
[facility] Note  12/14/2017     CHIEF COMPLAINT Patient presents for Retina Evaluation   HISTORY OF PRESENT ILLNESS: Ana Long is a 42 y.o. female who presents to the clinic today for:   HPI    Retina Evaluation    In both eyes.  This started months ago.  Duration of months.  Associated Symptoms Photophobia and Distortion.  Context:  distance vision, mid-range vision, near vision, reading and computer work.  Treatments tried include artificial tears.  Response to treatment was no improvement.  I, the attending physician,  performed the HPI with the patient and updated documentation appropriately.          Comments    42 y/o female pt referred by Dr. 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  VA good with current glasses.  Denies eye pain, but c/o intermittent irritation and tearing due to dryness.  Denies flashes, floaters.  No gtts, but occasionally uses warm compresses qhs OU.  BS 2 wks ago was 251.  A1C 11.6 1 month ago.       Last edited by Bernarda Caffey, MD on 12/14/2017 10:32 AM. (History)      Referring physician: [name], MD [address] [place], [place] [address]  HISTORICAL INFORMATION:   Selected notes from the MEDICAL RECORD NUMBER Referred by Dr. [name] for DM exam LEE: 09.24.19 (S. [place]) [BCVA: OD: 20/30 OS: 20/25] Ocular Hx-cataracts OU, DES OU, allergic conjunctivitis OU, glaucoma suspect, chalazion PMH-DM (takes Januvia, Lantus), rheumatoid arthritis, hx of migraines, herpes    CURRENT MEDICATIONS: No current outpatient medications on file. (Ophthalmic Drugs)   No current facility-administered medications for this visit.  (Ophthalmic Drugs)   Current Outpatient Medications (Other)  Medication Sig  . acyclovir (ZOVIRAX) 400 MG tablet Take 1 tablet (400 mg total) by mouth 2 (two) times daily.  Marland Kitchen albuterol (PROAIR HFA) 108 (90 Base) MCG/ACT inhaler Inhale 2 puffs into the lungs every 4  (four) hours as needed for wheezing or shortness of breath (cough).  Marland Kitchen atorvastatin (LIPITOR) 10 MG tablet Take 1 tablet (10 mg total) by mouth daily.  . diclofenac (VOLTAREN) 75 MG EC tablet Take 1 tablet (75 mg total) by mouth 2 (two) times daily.  . diclofenac sodium (VOLTAREN) 1 % GEL Apply 2 g topically 4 (four) times daily as needed.  . fluconazole (DIFLUCAN) 150 MG tablet TAKE 1 TAB BY MOUTH ONCE FOR 1 DOSE. CAN TAKE ADDITIONAL DOSE THREE DAYS LATER IF SYMPTOMS PERSIST  . glipiZIDE (GLUCOTROL) 10 MG tablet Take 1 tablet (10 mg total) by mouth 2 (two) times daily before a meal.  . glucose blood (ACCU-CHEK SMARTVIEW) test strip TEST THREE TIMES DAILY  . ibuprofen (ADVIL,MOTRIN) 600 MG tablet Take 1 tablet (600 mg total) by mouth every 8 (eight) hours as needed.  . Insulin Glargine (LANTUS SOLOSTAR) 100 UNIT/ML Solostar Pen Inject 73 Units into the skin every morning.  . Insulin Pen Needle (B-D UF III MINI PEN NEEDLES) 31G X 5 MM MISC USE AS DIRECTED TO INJECT INSULIN TWICE A DAY E11.65  . lactulose (CEPHULAC) 10 g packet Take 1 packet (10 g total) by mouth daily as needed. Reported on 07/29/2015  . Lancets (ACCU-CHEK SOFT TOUCH) lancets Use as instructed  . loratadine (CLARITIN) 10 MG tablet Take 1 tablet (10 mg total) by mouth 2 (two) times daily.  . meloxicam (MOBIC) 15 MG tablet Take 1 tablet (15  mg total) by mouth daily.  . methocarbamol (ROBAXIN) 500 MG tablet Take 1 tablet (500 mg total) by mouth every 8 (eight) hours as needed.  . mometasone-formoterol (DULERA) 200-5 MCG/ACT AERO Inhale 2 puffs into the lungs 2 (two) times daily. RINSE MOUTH AFTER EACH USE  . nitroGLYCERIN (NITRODUR - DOSED IN MG/24 HR) 0.2 mg/hr patch Apply 1/4th patch to affected achilles, change daily  . omeprazole (PRILOSEC) 20 MG capsule Take 1 capsule (20 mg total) by mouth daily before breakfast.  . Probiotic Product (PROBIOTIC DAILY PO) Take 2 capsules by mouth every evening.   . sitaGLIPtin (JANUVIA) 100 MG  tablet Take 1 tablet (100 mg total) by mouth daily.  . TURMERIC PO Take 2 tablets by mouth at bedtime. Takes generic turmeric   Current Facility-Administered Medications (Other)  Medication Route  . diphenhydrAMINE (BENADRYL) injection 25 mg Intravenous      REVIEW OF SYSTEMS: ROS    Positive for: Endocrine, Eyes   Negative for: Constitutional, Gastrointestinal, Neurological, Skin, Genitourinary, Musculoskeletal, HENT, Cardiovascular, Respiratory, Psychiatric, Allergic/Imm, Heme/Lymph   Last edited by Matthew Folks, COA on 12/14/2017 10:20 AM. (History)       ALLERGIES Allergies  Allergen Reactions  . Citrus Hives and Itching  . Latex Swelling  . Penicillins Hives and Itching  . Soap Hives, Itching and Swelling    PUREX LAUNDRY DETERGENT  . Tomato Hives and Itching  . Fluticasone     Per patient, makes sneeze and gag  . Hydrocodone Itching and Nausea Only  . Other Itching    Chocolate, Bananas, Acidic foods  . Shellfish Allergy   . Doxycycline Other (See Comments)    AFFECTS JOINTS  . Metformin And Related Rash    GI UPSET    PAST MEDICAL HISTORY Past Medical History:  Diagnosis Date  . Arthritis    major joints  . Asthma    prn inhaler  . Carpal tunnel syndrome of right wrist 09/2012  . Complication of anesthesia    states is hard to wake up post-op  . Diabetes mellitus    intolerant to Metformin  . Dry skin    hands   . Family history of anesthesia complication    mother went into coma during c-section  . GERD (gastroesophageal reflux disease)   . Herpes genital   . Morbid obesity with BMI of 50 to 60 07/23/2009  . Obesity hypoventilation syndrome ([place]) 10/30/2012  . Obesity, morbid ([place]) 01/10/2014  . OSA on CPAP     AHI was on  11-17-12 to 120/hr, titrated to 15 cm with 3 cm EPR/   . PCOS (polycystic ovarian syndrome)   . Rheumatoid arthritis ([place])   . Seasonal allergies   . Sleep apnea, obstructive    uses CPAP nightly - had sleep study 08/22/2007    Past Surgical History:  Procedure Laterality Date  . CARPAL TUNNEL RELEASE Right 10/02/2012   Procedure: RIGHT CARPAL TUNNEL RELEASE ENDOSCOPIC;  Surgeon: Jolyn Nap, MD;  Location: [facility];  Service: Orthopedics;  Laterality: Right;  . CHOLECYSTECTOMY N/A 12/02/2014   Procedure: LAPAROSCOPIC CHOLECYSTECTOMY;  Surgeon: Armandina Gemma, MD;  Location: [facility];  Service: General;  Laterality: N/A;  . HYSTEROSCOPY W/D&C  01-08-2002    FAMILY HISTORY Family History  Problem Relation Age of Onset  . Other Mother   . Leukemia Cousin   . Other Other   . Obesity Other   . Other Other   . Diabetes Maternal Aunt   .  Heart attack Maternal Aunt   . Alzheimer's disease Maternal Uncle   . Cancer Maternal Uncle        prostate  . Diabetes Paternal Aunt   . Cancer Paternal Aunt        brain,mouth  . Alzheimer's disease Paternal Aunt   . Diabetes Paternal Uncle   . Diabetes Maternal Grandmother   . Diabetes Maternal Grandfather     SOCIAL HISTORY Social History   Tobacco Use  . Smoking status: Former Smoker    Years: 10.00    Last attempt to quit: 05/24/2010    Years since quitting: 7.5  . Smokeless tobacco: Never Used  Substance Use Topics  . Alcohol use: Yes    Alcohol/week: 0.0 standard drinks    Comment: rarely  . Drug use: No         OPHTHALMIC EXAM:  Base Eye Exam    Visual Acuity (Snellen - Linear)      Right Left   Dist cc 20/30 +2 20/25 -   Dist ph cc 20/20 20/25 +   Correction:  Glasses       Tonometry (Tonopen, 10:15 AM)      Right Left   Pressure 26 22       Pupils      Dark Light Shape React APD   Right 4 3 Round Brisk None   Left 4 3 Round Brisk None       Visual Fields (Counting fingers)      Left Right    Full Full       Extraocular Movement      Right Left    Full, Ortho Full, Ortho       Neuro/Psych    Oriented x3:  Yes   Mood/Affect:  Normal       Dilation    Both eyes:  1.0% Mydriacyl, 2.5% Phenylephrine @  10:15 AM        Slit Lamp and Fundus Exam    Slit Lamp Exam      Right Left   Lids/Lashes Normal Normal   Conjunctiva/Sclera Normal Normal   Cornea Punctate epithelial erosions Punctate epithelial erosions   Anterior Chamber deep and quiet deep and quiet   Iris round and dilated round and dilated   Lens 1+ NS, CC 1+NS, CC   Vitreous mild vitreous syneresis mild vitreous syneresis       Fundus Exam      Right Left   Disc Pink and sharp Pink and sharp   C/D Ratio 0.65 0.6   Macula blunted foveal reflex, scattered parafoveal exudates and MA's  good foveal reflex, scatttered MA's, cluster of exudates superior to fovea and iferotemporal to fovea   Vessels Vascular attenuation, Tortuous Vascular attenuation, copper wiring, mild tortuosity   Periphery Attached, scattered MA, nasal CWS Attached, scattered DBH, scattered mild exudate        Refraction    Wearing Rx      Sphere Cylinder Axis   Right -2.50 +1.50 013   Left -2.25 Sphere    Age:  22yr   Type:  SVL       Manifest Refraction      Sphere Cylinder Axis Dist VA   Right -3.25 +1.25 015 20/20   Left -2.25 Sphere  20/25          IMAGING AND PROCEDURES  Imaging and Procedures for @TODAY @  OCT, Retina - OU - Both Eyes       Right Eye Quality  was good. Central Foveal Thickness: 231. Progression has no prior data. Findings include normal foveal contour, no SRF, intraretinal fluid (Mild cystic changes temporal fovea).   Left Eye Quality was good. Central Foveal Thickness: 264. Progression has no prior data. Findings include normal foveal contour, intraretinal fluid, no SRF (Patches of IRF -- temporal fovea, superior to fovea, inferotemporal to fovea).   Notes *Images captured and stored on drive  Diagnosis / Impression:  +DME OU  Clinical management:  See below  Abbreviations: NFP - Normal foveal profile. CME - cystoid macular edema. PED - pigment epithelial detachment. IRF - intraretinal fluid. SRF -  subretinal fluid. EZ - ellipsoid zone. ERM - epiretinal membrane. ORA - outer retinal atrophy. ORT - outer retinal tubulation. SRHM - subretinal hyper-reflective material        Fluorescein Angiography [place] (Transit OS)       Right Eye   Progression has no prior data. Early phase findings include microaneurysm, vascular perfusion defect. Mid/Late phase findings include microaneurysm, vascular perfusion defect.   Left Eye   Progression has no prior data. Early phase findings include microaneurysm, vascular perfusion defect. Mid/Late phase findings include microaneurysm, vascular perfusion defect.   Notes Images stored on drive;   Impression: -Late leaking microaneurysms OU -No NV OU -Moderate NPDR OU                ASSESSMENT/PLAN:    ICD-10-CM   1. Moderate nonproliferative diabetic retinopathy of both eyes with macular edema associated with type 2 diabetes mellitus ([place]) N82.9562 Fluorescein Angiography [place] (Transit OS)  2. Retinal edema H35.81 OCT, Retina - OU - Both Eyes  3. Essential hypertension I10   4. Hypertensive retinopathy of both eyes H35.033 Fluorescein Angiography [place] (Transit OS)  5. Nuclear sclerosis of both eyes H25.13     1,2. Moderate non-proliferative diabetic retinopathy w/ DME, OU - The incidence, risk factors for progression, natural history and treatment options for diabetic retinopathy  were discussed with patient.   - The need for close monitoring of blood glucose, blood pressure, and serum lipids, avoiding cigarette or any type of tobacco, and the need for long term follow up was also discussed with patient. - exam shows scattered MA, IRH, exudates and mild macular edema OU - FA shows leaking MA OU; no NV OU  **of note, pt had pruritic rxn to fluorescein dye -- resolved with 25 mg IV benadryl** - OCT shows mild diabetic macular edema, OU - BCVA 20/20 OD, 20/25 OS The natural history, pathology, and characteristics of  diabetic macular edema discussed with patient.  A generalized discussion of the major clinical trials concerning treatment of diabetic macular edema (ETDRS, DCT, SCORE, RISE / RIDE, and ongoing DRCR net studies) was completed.  This discussion included mention of the various approaches to treating diabetic macular edema (observation, laser photocoagulation, anti-VEGF injections with lucentis / Avastin / Eylea, steroid injections with Kenalog / Ozurdex, and intraocular surgery with vitrectomy).  The goal hemoglobin A1C of 6-7 was discussed, as well as importance of smoking cessation and hypertension control.  Need for ongoing treatment and monitoring were specifically discussed with reference to chronic nature of diabetic macular edema. - recommend focal laser OS today -- target inf temp MA with circinate exudates -- very amenable to laser - pt wishes to come back for laser  - f/u in 2-3 wks for focal laser OS  3,4. Hypertensive retinopathy OU - discussed importance of tight BP control - monitor  5. Nuclear Sclerosis  -  The symptoms of cataract, surgical options, and treatments and risks were discussed with patient. - discussed diagnosis and progression - not yet visually significant - monitor for now   Ophthalmic Meds Ordered this visit:  Meds ordered this encounter  Medications  . diphenhydrAMINE (BENADRYL) injection 25 mg       Return for 2-3 weeks, F/U NPDR OS, focal laser OS, DFE.  There are no Patient Instructions on file for this visit.   Explained the diagnoses, plan, and follow up with the patient and they expressed understanding.  Patient expressed understanding of the importance of proper follow up care.   This document serves as a record of services personally performed by Gardiner Sleeper, MD, PhD. It was created on their behalf by Ernest Mallick, OA, an ophthalmic assistant. The creation of this record is the provider's dictation and/or activities during the visit.     Electronically signed by: Ernest Mallick, OA  10.22.19 11:57 PM    Gardiner Sleeper, M.D., Ph.D. Diseases & Surgery of the Retina and Vitreous [facility]   I have reviewed the above documentation for accuracy and completeness, and I agree with the above. Gardiner Sleeper, M.D., Ph.D. 12/16/17 1:04 AM    Abbreviations: M myopia (nearsighted); A astigmatism; H hyperopia (farsighted); P presbyopia; Mrx spectacle prescription;  CTL contact lenses; OD right eye; OS left eye; OU both eyes  XT exotropia; ET esotropia; PEK punctate epithelial keratitis; PEE punctate epithelial erosions; DES dry eye syndrome; MGD meibomian gland dysfunction; ATs artificial tears; PFAT's preservative free artificial tears; [place] nuclear sclerotic cataract; PSC posterior subcapsular cataract; ERM epi-retinal membrane; PVD posterior vitreous detachment; RD retinal detachment; DM diabetes mellitus; DR diabetic retinopathy; NPDR non-proliferative diabetic retinopathy; PDR proliferative diabetic retinopathy; CSME clinically significant macular edema; DME diabetic macular edema; dbh dot blot hemorrhages; CWS cotton wool spot; POAG primary open angle glaucoma; C/D cup-to-disc ratio; HVF humphrey visual field; GVF goldmann visual field; OCT optical coherence tomography; IOP intraocular pressure; BRVO Branch retinal vein occlusion; CRVO central retinal vein occlusion; CRAO central retinal artery occlusion; BRAO branch retinal artery occlusion; RT retinal tear; SB scleral buckle; PPV pars plana vitrectomy; VH Vitreous hemorrhage; PRP panretinal laser photocoagulation; IVK intravitreal kenalog; VMT vitreomacular traction; MH Macular hole;  NVD neovascularization of the disc; NVE neovascularization elsewhere; AREDS age related eye disease study; ARMD age related macular degeneration; POAG primary open angle glaucoma; EBMD epithelial/anterior basement membrane dystrophy; ACIOL anterior chamber intraocular lens; IOL  intraocular lens; PCIOL posterior chamber intraocular lens; Phaco/IOL phacoemulsification with intraocular lens placement; [place] photorefractive keratectomy; LASIK laser assisted in situ keratomileusis; HTN hypertension; DM diabetes mellitus; COPD chronic obstructive pulmonary disease

## 2017-12-14 ENCOUNTER — Encounter (INDEPENDENT_AMBULATORY_CARE_PROVIDER_SITE_OTHER): Payer: Self-pay | Admitting: Ophthalmology

## 2017-12-14 ENCOUNTER — Ambulatory Visit (INDEPENDENT_AMBULATORY_CARE_PROVIDER_SITE_OTHER): Payer: Medicaid Other | Admitting: Ophthalmology

## 2017-12-14 DIAGNOSIS — E113313 Type 2 diabetes mellitus with moderate nonproliferative diabetic retinopathy with macular edema, bilateral: Secondary | ICD-10-CM

## 2017-12-14 DIAGNOSIS — H35033 Hypertensive retinopathy, bilateral: Secondary | ICD-10-CM | POA: Diagnosis not present

## 2017-12-14 DIAGNOSIS — H3581 Retinal edema: Secondary | ICD-10-CM

## 2017-12-14 DIAGNOSIS — I10 Essential (primary) hypertension: Secondary | ICD-10-CM | POA: Diagnosis not present

## 2017-12-14 DIAGNOSIS — H2513 Age-related nuclear cataract, bilateral: Secondary | ICD-10-CM

## 2017-12-14 MED ORDER — DIPHENHYDRAMINE HCL 50 MG/ML IJ SOLN: 25.0000 mg | Freq: Once | INTRAMUSCULAR | Status: DC

## 2017-12-14 NOTE — Progress Notes (Deleted)
[facility] Note  12/14/2017     CHIEF COMPLAINT Patient presents for Retina Evaluation   HISTORY OF PRESENT ILLNESS: Ana Long is a 42 y.o. female who presents to the clinic today for:   HPI    Retina Evaluation    In both eyes.  This started months ago.  Duration of months.  Associated Symptoms Photophobia and Distortion.  Context:  distance vision, mid-range vision, near vision, reading and computer work.  Treatments tried include artificial tears.  Response to treatment was no improvement.  I, the attending physician,  performed the HPI with the patient and updated documentation appropriately.          Comments    42 y/o female pt referred by Dr. 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  VA good with current glasses.  Denies eye pain, but c/o intermittent irritation and tearing due to dryness.  Denies flashes, floaters.  No gtts, but occasionally uses warm compresses qhs OU.  BS 2 wks ago was 251.  A1C 11.6 1 month ago.       Last edited by Bernarda Caffey, MD on 12/14/2017 10:32 AM. (History)      Referring physician: [name], MD [address] [place], [place] [address]  HISTORICAL INFORMATION:   Selected notes from the MEDICAL RECORD NUMBER    CURRENT MEDICATIONS: No current outpatient medications on file. (Ophthalmic Drugs)   No current facility-administered medications for this visit.  (Ophthalmic Drugs)   Current Outpatient Medications (Other)  Medication Sig  . acyclovir (ZOVIRAX) 400 MG tablet Take 1 tablet (400 mg total) by mouth 2 (two) times daily.  Marland Kitchen albuterol (PROAIR HFA) 108 (90 Base) MCG/ACT inhaler Inhale 2 puffs into the lungs every 4 (four) hours as needed for wheezing or shortness of breath (cough).  Marland Kitchen atorvastatin (LIPITOR) 10 MG tablet Take 1 tablet (10 mg total) by mouth daily.  . diclofenac (VOLTAREN) 75 MG EC tablet Take 1 tablet (75 mg total) by mouth 2 (two) times daily.  .  diclofenac sodium (VOLTAREN) 1 % GEL Apply 2 g topically 4 (four) times daily as needed.  . fluconazole (DIFLUCAN) 150 MG tablet TAKE 1 TAB BY MOUTH ONCE FOR 1 DOSE. CAN TAKE ADDITIONAL DOSE THREE DAYS LATER IF SYMPTOMS PERSIST  . glipiZIDE (GLUCOTROL) 10 MG tablet Take 1 tablet (10 mg total) by mouth 2 (two) times daily before a meal.  . glucose blood (ACCU-CHEK SMARTVIEW) test strip TEST THREE TIMES DAILY  . ibuprofen (ADVIL,MOTRIN) 600 MG tablet Take 1 tablet (600 mg total) by mouth every 8 (eight) hours as needed.  . Insulin Glargine (LANTUS SOLOSTAR) 100 UNIT/ML Solostar Pen Inject 73 Units into the skin every morning.  . Insulin Pen Needle (B-D UF III MINI PEN NEEDLES) 31G X 5 MM MISC USE AS DIRECTED TO INJECT INSULIN TWICE A DAY E11.65  . lactulose (CEPHULAC) 10 g packet Take 1 packet (10 g total) by mouth daily as needed. Reported on 07/29/2015  . Lancets (ACCU-CHEK SOFT TOUCH) lancets Use as instructed  . loratadine (CLARITIN) 10 MG tablet Take 1 tablet (10 mg total) by mouth 2 (two) times daily.  . meloxicam (MOBIC) 15 MG tablet Take 1 tablet (15 mg total) by mouth daily.  . methocarbamol (ROBAXIN) 500 MG tablet Take 1 tablet (500 mg total) by mouth every 8 (eight) hours as needed.  . mometasone-formoterol (DULERA) 200-5 MCG/ACT AERO Inhale 2 puffs into the  lungs 2 (two) times daily. RINSE MOUTH AFTER EACH USE  . nitroGLYCERIN (NITRODUR - DOSED IN MG/24 HR) 0.2 mg/hr patch Apply 1/4th patch to affected achilles, change daily  . omeprazole (PRILOSEC) 20 MG capsule Take 1 capsule (20 mg total) by mouth daily before breakfast.  . Probiotic Product (PROBIOTIC DAILY PO) Take 2 capsules by mouth every evening.   . sitaGLIPtin (JANUVIA) 100 MG tablet Take 1 tablet (100 mg total) by mouth daily.  . TURMERIC PO Take 2 tablets by mouth at bedtime. Takes generic turmeric   Current Facility-Administered Medications (Other)  Medication Route  . diphenhydrAMINE (BENADRYL) injection 25 mg Intravenous       REVIEW OF SYSTEMS: ROS    Positive for: Endocrine, Eyes   Negative for: Constitutional, Gastrointestinal, Neurological, Skin, Genitourinary, Musculoskeletal, HENT, Cardiovascular, Respiratory, Psychiatric, Allergic/Imm, Heme/Lymph   Last edited by Matthew Folks, COA on 12/14/2017 10:20 AM. (History)       ALLERGIES Allergies  Allergen Reactions  . Citrus Hives and Itching  . Latex Swelling  . Penicillins Hives and Itching  . Soap Hives, Itching and Swelling    PUREX LAUNDRY DETERGENT  . Tomato Hives and Itching  . Fluticasone     Per patient, makes sneeze and gag  . Hydrocodone Itching and Nausea Only  . Other Itching    Chocolate, Bananas, Acidic foods  . Shellfish Allergy   . Doxycycline Other (See Comments)    AFFECTS JOINTS  . Metformin And Related Rash    GI UPSET    PAST MEDICAL HISTORY Past Medical History:  Diagnosis Date  . Arthritis    major joints  . Asthma    prn inhaler  . Carpal tunnel syndrome of right wrist 09/2012  . Complication of anesthesia    states is hard to wake up post-op  . Diabetes mellitus    intolerant to Metformin  . Dry skin    hands   . Family history of anesthesia complication    mother went into coma during c-section  . GERD (gastroesophageal reflux disease)   . Herpes genital   . Morbid obesity with BMI of 50 to 60 07/23/2009  . Obesity hypoventilation syndrome ([place]) 10/30/2012  . Obesity, morbid ([place]) 01/10/2014  . OSA on CPAP     AHI was on  11-17-12 to 120/hr, titrated to 15 cm with 3 cm EPR/   . PCOS (polycystic ovarian syndrome)   . Rheumatoid arthritis ([place])   . Seasonal allergies   . Sleep apnea, obstructive    uses CPAP nightly - had sleep study 08/22/2007   Past Surgical History:  Procedure Laterality Date  . CARPAL TUNNEL RELEASE Right 10/02/2012   Procedure: RIGHT CARPAL TUNNEL RELEASE ENDOSCOPIC;  Surgeon: Jolyn Nap, MD;  Location: [facility];  Service: Orthopedics;  Laterality:  Right;  . CHOLECYSTECTOMY N/A 12/02/2014   Procedure: LAPAROSCOPIC CHOLECYSTECTOMY;  Surgeon: Armandina Gemma, MD;  Location: [facility];  Service: General;  Laterality: N/A;  . HYSTEROSCOPY W/D&C  01-08-2002    FAMILY HISTORY Family History  Problem Relation Age of Onset  . Other Mother   . Leukemia Cousin   . Other Other   . Obesity Other   . Other Other   . Diabetes Maternal Aunt   . Heart attack Maternal Aunt   . Alzheimer's disease Maternal Uncle   . Cancer Maternal Uncle        prostate  . Diabetes Paternal Aunt   . Cancer Paternal Aunt  brain,mouth  . Alzheimer's disease Paternal Aunt   . Diabetes Paternal Uncle   . Diabetes Maternal Grandmother   . Diabetes Maternal Grandfather     SOCIAL HISTORY Social History   Tobacco Use  . Smoking status: Former Smoker    Years: 10.00    Last attempt to quit: 05/24/2010    Years since quitting: 7.5  . Smokeless tobacco: Never Used  Substance Use Topics  . Alcohol use: Yes    Alcohol/week: 0.0 standard drinks    Comment: rarely  . Drug use: No         OPHTHALMIC EXAM:  Base Eye Exam    Visual Acuity (Snellen - Linear)      Right Left   Dist cc 20/30 +2 20/25 -   Dist ph cc 20/20 20/25 +   Correction:  Glasses       Tonometry (Tonopen, 10:15 AM)      Right Left   Pressure 26 22       Pupils      Dark Light Shape React APD   Right 4 3 Round Brisk None   Left 4 3 Round Brisk None       Visual Fields (Counting fingers)      Left Right    Full Full       Extraocular Movement      Right Left    Full, Ortho Full, Ortho       Neuro/Psych    Oriented x3:  Yes   Mood/Affect:  Normal       Dilation    Both eyes:  1.0% Mydriacyl, 2.5% Phenylephrine @ 10:15 AM        Slit Lamp and Fundus Exam    Slit Lamp Exam      Right Left   Lids/Lashes Normal Normal   Conjunctiva/Sclera Normal Normal   Cornea Punctate epithelial erosions Punctate epithelial erosions   Anterior Chamber deep and quiet deep and  quiet   Iris round and dilated round and dilated   Lens 1+ NS, CC 1+NS, CC   Vitreous mild vitreous syneresis mild vitreous syneresis       Fundus Exam      Right Left   Disc Pink and sharp Pink and sharp   C/D Ratio 0.65 0.6   Macula blunted foveal reflex, scattered parafoveal exudates and MA's  good foveal reflex, scatttered MA's, cluster of exudates superior to fovea and iferotemporal to fovea   Vessels Vascular attenuation, Tortuous Vascular attenuation, copper wiring, mild tortuosity   Periphery Attached, scattered MA, nasal CWS Attached, scattered DBH, scattered mild exudate        Refraction    Wearing Rx      Sphere Cylinder Axis   Right -2.50 +1.50 013   Left -2.25 Sphere    Age:  26yr   Type:  SVL       Manifest Refraction      Sphere Cylinder Axis Dist VA   Right -3.25 +1.25 015 20/20   Left -2.25 Sphere  20/25          IMAGING AND PROCEDURES  Imaging and Procedures for @TODAY @  OCT, Retina - OU - Both Eyes       Right Eye Quality was good. Central Foveal Thickness: 231. Progression has no prior data. Findings include normal foveal contour, no SRF, intraretinal fluid (Mild cystic changes temporal fovea).   Left Eye Quality was good. Central Foveal Thickness: 264. Progression has no prior data. Findings include  normal foveal contour, intraretinal fluid, no SRF (Patches of IRF -- temporal fovea, superior to fovea, inferotemporal to fovea).   Notes *Images captured and stored on drive  Diagnosis / Impression:  +DME OU  Clinical management:  See below  Abbreviations: NFP - Normal foveal profile. CME - cystoid macular edema. PED - pigment epithelial detachment. IRF - intraretinal fluid. SRF - subretinal fluid. EZ - ellipsoid zone. ERM - epiretinal membrane. ORA - outer retinal atrophy. ORT - outer retinal tubulation. SRHM - subretinal hyper-reflective material        Fluorescein Angiography [place] (Transit OS)       Right Eye   Progression  has no prior data. Early phase findings include microaneurysm, vascular perfusion defect. Mid/Late phase findings include microaneurysm, vascular perfusion defect.   Left Eye   Progression has no prior data. Early phase findings include microaneurysm, vascular perfusion defect. Mid/Late phase findings include microaneurysm, vascular perfusion defect.   Notes Images stored on drive;   Impression: -Late leaking microaneurysms OU -No NV OU -Moderate NPDR OU                ASSESSMENT/PLAN:    ICD-10-CM   1. Retinal edema H35.81 OCT, Retina - OU - Both Eyes  2. 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 Hypertensive retinopathy of both eyes H35.033 Fluorescein Angiography [place] (Transit OS)    1.   2.  3.  Ophthalmic Meds Ordered this visit:  Meds ordered this encounter  Medications  . diphenhydrAMINE (BENADRYL) injection 25 mg       Return for 2-3 weeks, F/U NPDR OS, focal laser OS, DFE.  There are no Patient Instructions on file for this visit.   Explained the diagnoses, plan, and follow up with the patient and they expressed understanding.  Patient expressed understanding of the importance of proper follow up care.   Gardiner Sleeper, M.D., Ph.D. Diseases & Surgery of the Retina and [place] @TODAY @     Abbreviations: M myopia (nearsighted); A astigmatism; H hyperopia (farsighted); P presbyopia; Mrx spectacle prescription;  CTL contact lenses; OD right eye; OS left eye; OU both eyes  XT exotropia; ET esotropia; PEK punctate epithelial keratitis; PEE punctate epithelial erosions; DES dry eye syndrome; MGD meibomian gland dysfunction; ATs artificial tears; PFAT's preservative free artificial tears; [place] nuclear sclerotic cataract; PSC posterior subcapsular cataract; ERM epi-retinal  membrane; PVD posterior vitreous detachment; RD retinal detachment; DM diabetes mellitus; DR diabetic retinopathy; NPDR non-proliferative diabetic retinopathy; PDR proliferative diabetic retinopathy; CSME clinically significant macular edema; DME diabetic macular edema; dbh dot blot hemorrhages; CWS cotton wool spot; POAG primary open angle glaucoma; C/D cup-to-disc ratio; HVF humphrey visual field; GVF goldmann visual field; OCT optical coherence tomography; IOP intraocular pressure; BRVO Branch retinal vein occlusion; CRVO central retinal vein occlusion; CRAO central retinal artery occlusion; BRAO branch retinal artery occlusion; RT retinal tear; SB scleral buckle; PPV pars plana vitrectomy; VH Vitreous hemorrhage; PRP panretinal laser photocoagulation; IVK intravitreal kenalog; VMT vitreomacular traction; MH Macular hole;  NVD neovascularization of the disc; NVE neovascularization elsewhere; AREDS age related eye disease study; ARMD age related macular degeneration; POAG primary open angle glaucoma; EBMD epithelial/anterior basement membrane dystrophy; ACIOL anterior chamber intraocular lens; IOL intraocular lens; PCIOL posterior chamber intraocular lens; Phaco/IOL phacoemulsification with intraocular lens placement; [place] photorefractive keratectomy; LASIK laser assisted in situ keratomileusis; HTN hypertension; DM diabetes mellitus;  COPD chronic obstructive pulmonary disease

## 2017-12-15 ENCOUNTER — Ambulatory Visit
Admission: RE | Admit: 2017-12-15 | Discharge: 2017-12-15 | Disposition: A | Discharge status: Home / Self Care | Payer: Medicaid Other | Source: Ambulatory Visit | Attending: Obstetrics & Gynecology | Admitting: Obstetrics & Gynecology

## 2017-12-15 DIAGNOSIS — Z1231 Encounter for screening mammogram for malignant neoplasm of breast: Secondary | ICD-10-CM

## 2017-12-22 ENCOUNTER — Other Ambulatory Visit: Payer: Self-pay

## 2017-12-22 ENCOUNTER — Ambulatory Visit (INDEPENDENT_AMBULATORY_CARE_PROVIDER_SITE_OTHER): Payer: Medicaid Other | Admitting: Internal Medicine

## 2017-12-22 ENCOUNTER — Encounter: Payer: Self-pay | Admitting: Internal Medicine

## 2017-12-22 VITALS — BP 111/57 | HR 99 | Temp 99.3°F | Ht 62.0 in | Wt 316.0 lb

## 2017-12-22 DIAGNOSIS — J4531 Mild persistent asthma with (acute) exacerbation: Secondary | ICD-10-CM

## 2017-12-22 DIAGNOSIS — Z9989 Dependence on other enabling machines and devices: Secondary | ICD-10-CM

## 2017-12-22 DIAGNOSIS — G4733 Obstructive sleep apnea (adult) (pediatric): Secondary | ICD-10-CM | POA: Diagnosis not present

## 2017-12-22 DIAGNOSIS — Z7951 Long term (current) use of inhaled steroids: Secondary | ICD-10-CM | POA: Diagnosis not present

## 2017-12-22 DIAGNOSIS — J45901 Unspecified asthma with (acute) exacerbation: Secondary | ICD-10-CM

## 2017-12-22 DIAGNOSIS — J45909 Unspecified asthma, uncomplicated: Secondary | ICD-10-CM

## 2017-12-22 MED ORDER — PREDNISONE 10 MG PO TABS: 40.0000 mg | ORAL_TABLET | Freq: Every day | ORAL | 0 refills | Status: DC

## 2017-12-22 MED ORDER — IPRATROPIUM BROMIDE 0.02 % IN SOLN
0.5000 mg | Freq: Once | RESPIRATORY_TRACT | Status: AC
  Administered 2017-12-22: 0.5 mg via RESPIRATORY_TRACT

## 2017-12-22 MED ORDER — ALBUTEROL SULFATE (2.5 MG/3ML) 0.083% IN NEBU
2.5000 mg | INHALATION_SOLUTION | Freq: Once | RESPIRATORY_TRACT | Status: AC
  Administered 2017-12-22: 2.5 mg via RESPIRATORY_TRACT

## 2017-12-22 MED ORDER — IPRATROPIUM-ALBUTEROL 0.5-2.5 (3) MG/3ML IN SOLN: 3.0000 mL | Freq: Once | RESPIRATORY_TRACT | Status: DC

## 2017-12-22 NOTE — Patient Instructions (Addendum)
FOLLOW-UP INSTRUCTIONS When: If your symptoms worsen or fail to improve  Today we discussed your asthma. I have written for 14m of prednisone for five days.  For your sleep apnea I have ordered the Nasal device mask. I will work with you to obtain this.   As always if your symptoms worsen, fail to improve, or you develop other concerning symptoms, please notify our office or visit the local ER if we are unavailable. Symptoms including weakness, wheezing, fever, should not be ignored and should encourage you to visit the ED if we are unavailable by phone or the symptoms are severe.  [facility]. If you have any questions or concerns please call u[place]at [phone]

## 2017-12-22 NOTE — Progress Notes (Signed)
CC: coughing and shortness of breath  HPI:Ana Long is a 42 y.o. female who presents for evaluation of coughing and shortness of breath. Please see individual problem based A/P for details.   Mild Asthma exacerbation: The patient endorses increasing cough and difficulty catching her breath. The cough is difficult to control and worsened by her fatigue. She endorses mild shortness of breath but denied fever, chills, nausea, vomiting, chest pain, headache, visual changes, ear pain, eye drainage or discoloration. She also endorses associated symptoms of nasal drainage but denies sputum production.  She endorses sick contacts, her daughter also has asthma and is experiencing the same symptoms.  The patient's symptoms do not entirely respond to her rescue inhaler.  She endorses compliance with Dulera but when demonstrating usage in the room it appears that her system is inadequate as she does not initiate the breath prior to administration of each dose by reducing the effectiveness of the medication. Physical exam reveals no wheezing, good breath sounds with good air movement, no focal findings.  Given her symptoms I feel that she is experiencing an acute mild asthma exacerbation unresponsive to inhalers initiated by a combination of a viral URI and lack of her CPAP utilization. Given the relative short duration of the steroids and the lack of sliding scale or mealtime insulin will not make adjustments to her current diabetes regimen. The patient reported well to single dose of albuterol/ipratropium in the clinic and appears stable for discharge home.  Vitals are stable with normal blood pressure, SPO2 95%, and heart rate of 99.  Plan: Patient will be given an inhaler spacer Continue Dulera as previously dosed Continue rescue inhaler Prednisone 40 mg x 5 days Patient given strict return precautions Assisted CPAP device acquisition  OSA: The patient stated that she is not using her  CPAP correctly due to nasal mask having been eaten by her new puppy. She went to see her sleep specialist Neurologist but as she had not been there in over one year they could not see her until January. She endorses extreme daytime fatigue and somnolence. She is notably fatigued today and drifts off during the conversation. I feel this is contributing to her asthma exacerbation and is a significant risk to her health, her kids and the public if she drives. She does not require a titration of any device and is simply in need of a new nasal device for her CPAP.  I feel that it is appropriate to prescribe this.   Plan: Will order DME for patients CPAP device and attempt to secure this for her to prevent harm. Ordered DME for Nasal Pillows and harness (dog destroyed both)  Sleep Neurologist:  Dr. Asencion Partridge Dohmeier [address], [place], [place] [address] [phone]  PHQ-9: Based on the patients    Office Visit from 12/22/2017 in [place]  PHQ-9 Total Score  4     score we have decided to treat her OSA.  Past Medical History:  Diagnosis Date  . Arthritis    major joints  . Asthma    prn inhaler  . Carpal tunnel syndrome of right wrist 09/2012  . Complication of anesthesia    states is hard to wake up post-op  . Diabetes mellitus    intolerant to Metformin  . Dry skin    hands   . Family history of anesthesia complication    mother went into coma during c-section  . GERD (gastroesophageal reflux  disease)   . Herpes genital   . Morbid obesity with BMI of 50 to 60 07/23/2009  . Obesity hypoventilation syndrome ([place]) 10/30/2012  . Obesity, morbid ([place]) 01/10/2014  . OSA on CPAP     AHI was on  11-17-12 to 120/hr, titrated to 15 cm with 3 cm EPR/   . PCOS (polycystic ovarian syndrome)   . Rheumatoid arthritis ([place])   . Seasonal allergies   . Sleep apnea, obstructive    uses CPAP nightly - had sleep study 08/22/2007   Review of Systems:  ROS negative except  as per HPI.  Physical Exam: Vitals:   12/22/17 1552  BP: (!) 111/57  Pulse: 99  Temp: 99.3 F (37.4 C)  TempSrc: Oral  SpO2: 95%  Weight: (!) 316 lb (143.3 kg)  Height: 5' 2"  (1.575 m)   General: Oriented x4, in no acute distress, afebrile nondiaphoretic Cardio: RRR, no mrg's Pulmonary: CTA bilaterally, no wheezing noted, air movement sufficient   Assessment & Plan:   See Encounters Tab for problem based charting.  Patient seen with Dr. Eppie Gibson

## 2017-12-23 ENCOUNTER — Telehealth: Payer: Self-pay | Admitting: Dietician

## 2017-12-23 ENCOUNTER — Encounter: Payer: Self-pay | Admitting: Internal Medicine

## 2017-12-23 NOTE — Telephone Encounter (Signed)
Steroid-Induced Hyperglycemia Prevention and Management Janazia A Hester-Ward is a 42 y.o. female who meets criteria for [facility] glucose monitoring program (diabetes patient prescribed short course of steroids).  A/P Current Regimen  Patient prescribed prednisone 40 mg daily x 5 days, currently on day 1 of therapy. Patient taking prednisone in the AM  Prednisone indication: asthma  Current DM regimen januvia, glipizide 10 mg twice daily, 73 units lantus QAM  Home BG Monitoring  Patient does have a meter at home and does check BG at home. Meter was not supplied.  CBGs prior to steroid course 321, A1C prior to steroid course   S/Sx of hyper- or hypoglycemia: none, none  Medication Management  Switch prednisone dose to AM not applicable  Additional treatment for BG control most likley will be indicated at this time.  Increase basal insulin 10% if 2 or more blood sugars are >200 mg/dl.   Physician preference level per protocol: 1  Medication supply Patient will use own medication  Patient Education  Advised patient to monitor BG while on steroid therapy (at least twice daily prior to first 2 meals of the day).  Patient educated about signs/symptoms and advised to contact clinic if hyper- or hypoglycemic.  Patient did  verbalize understanding of information and regimen by repeating back topics discussed.  Follow-up Will call on Monday to follow up on hr blood sugars and insulin dose  as needed  Butch Penny Donis Kotowski 2:34 PM 12/23/2017

## 2017-12-23 NOTE — Assessment & Plan Note (Signed)
Mild Asthma exacerbation: The patient endorses increasing cough and difficulty catching her breath. The cough is difficult to control and worsened by her fatigue. She endorses mild shortness of breath but denied fever, chills, nausea, vomiting, chest pain, headache, visual changes, ear pain, eye drainage or discoloration. She also endorses associated symptoms of nasal drainage but denies sputum production.  She endorses sick contacts, her daughter also has asthma and is experiencing the same symptoms.  The patient's symptoms do not entirely respond to her rescue inhaler.  She endorses compliance with Dulera but when demonstrating usage in the room it appears that her system is inadequate as she does not initiate the breath prior to administration of each dose by reducing the effectiveness of the medication. Physical exam reveals no wheezing, good breath sounds with good air movement, no focal findings.  Given her symptoms I feel that she is experiencing an acute mild asthma exacerbation unresponsive to inhalers initiated by a combination of a viral URI and lack of her CPAP utilization. Given the relative short duration of the steroids and the lack of sliding scale or mealtime insulin will not make adjustments to her current diabetes regimen. The patient reported well to single dose of albuterol/ipratropium in the clinic and appears stable for discharge home.  Vitals are stable with normal blood pressure, SPO2 95%, and heart rate of 99.  Plan: Patient will be given an inhaler spacer Continue Dulera as previously dosed Continue rescue inhaler Prednisone 40 mg x 5 days Patient given strict return precautions Assisted CPAP device acquisition

## 2017-12-23 NOTE — Assessment & Plan Note (Signed)
  OSA: The patient stated that she is not using her CPAP correctly due to nasal mask having been eaten by her new puppy. She went to see her sleep specialist Neurologist but as she had not been there in over one year they could not see her until January. She endorses extreme daytime fatigue and somnolence. She is notably fatigued today and drifts off during the conversation. I feel this is contributing to her asthma exacerbation and is a significant risk to her health, her kids and the public if she drives. She does not require a titration of any device and is simply in need of a new nasal device for her CPAP.  I feel that it is appropriate to prescribe this.   Plan: Will order DME for patients CPAP device and attempt to secure this for her to prevent harm. Ordered DME for Nasal Pillows and harness (dog destroyed both)  Sleep Neurologist:  Dr. Asencion Partridge Dohmeier [place], [place], [place] [address] [phone]

## 2017-12-24 ENCOUNTER — Other Ambulatory Visit: Payer: Self-pay | Admitting: Internal Medicine

## 2017-12-24 DIAGNOSIS — J45909 Unspecified asthma, uncomplicated: Secondary | ICD-10-CM

## 2017-12-25 ENCOUNTER — Telehealth: Payer: Self-pay | Admitting: Internal Medicine

## 2017-12-25 DIAGNOSIS — N898 Other specified noninflammatory disorders of vagina: Secondary | ICD-10-CM

## 2017-12-25 MED ORDER — FLUCONAZOLE 150 MG PO TABS: 150.0000 mg | ORAL_TABLET | Freq: Every day | ORAL | 0 refills | Status: AC

## 2017-12-25 NOTE — Telephone Encounter (Signed)
   Reason for call:   I received a call from Ms. Jola A Renaldo-Ward at 5:30 AM indicating elevated blood sugar and vaginal itching.   Pertinent Data:   Patient states that her blood sugar has remained elevated despite increasing her dose recently while taking a course of prednisone for an asthma exacerbation. She is prescribed prednisone 25m Daily and is on day 4 of 5. Her Lantus dose has already been increased by 10% to 80U daily and her blood sugar this morning mid 400s.   She additionally complains of vaginal itching that is typical for a yeast infection for her.    Assessment / Plan / Recommendations:   Patient has persistently elevated blood sugar in the setting of steroid use. She was instructed to increase her Lantus dose by an additional 10% to 88 units. She is to call again if blood sugar fails to come below 400.   Diflucan 1513mDaily for 3 days for suspected yeast infection.   As always, pt is advised that if symptoms worsen or new symptoms arise, they should go to an urgent care facility or to to ER for further evaluation.   MeNeva SeatMD   12/25/2017, 5:46 AM

## 2017-12-26 ENCOUNTER — Ambulatory Visit: Payer: Medicaid Other | Admitting: Internal Medicine

## 2017-12-26 ENCOUNTER — Telehealth: Payer: Self-pay | Admitting: Internal Medicine

## 2017-12-26 ENCOUNTER — Encounter: Payer: Self-pay | Admitting: Internal Medicine

## 2017-12-26 ENCOUNTER — Ambulatory Visit: Payer: Medicaid Other

## 2017-12-26 ENCOUNTER — Other Ambulatory Visit: Payer: Self-pay

## 2017-12-26 VITALS — BP 139/70 | HR 96 | Temp 98.9°F | Ht 62.0 in | Wt 318.6 lb

## 2017-12-26 DIAGNOSIS — J45909 Unspecified asthma, uncomplicated: Secondary | ICD-10-CM

## 2017-12-26 DIAGNOSIS — R739 Hyperglycemia, unspecified: Secondary | ICD-10-CM | POA: Diagnosis not present

## 2017-12-26 DIAGNOSIS — Z8669 Personal history of other diseases of the nervous system and sense organs: Secondary | ICD-10-CM | POA: Diagnosis not present

## 2017-12-26 DIAGNOSIS — Z7951 Long term (current) use of inhaled steroids: Secondary | ICD-10-CM

## 2017-12-26 DIAGNOSIS — G4733 Obstructive sleep apnea (adult) (pediatric): Secondary | ICD-10-CM | POA: Diagnosis not present

## 2017-12-26 DIAGNOSIS — R519 Headache, unspecified: Secondary | ICD-10-CM

## 2017-12-26 DIAGNOSIS — R51 Headache: Secondary | ICD-10-CM

## 2017-12-26 DIAGNOSIS — Z79899 Other long term (current) drug therapy: Secondary | ICD-10-CM

## 2017-12-26 DIAGNOSIS — E1165 Type 2 diabetes mellitus with hyperglycemia: Secondary | ICD-10-CM

## 2017-12-26 DIAGNOSIS — Z794 Long term (current) use of insulin: Secondary | ICD-10-CM

## 2017-12-26 DIAGNOSIS — J4531 Mild persistent asthma with (acute) exacerbation: Secondary | ICD-10-CM

## 2017-12-26 DIAGNOSIS — M069 Rheumatoid arthritis, unspecified: Secondary | ICD-10-CM

## 2017-12-26 HISTORY — DX: Hyperglycemia, unspecified: R73.9

## 2017-12-26 HISTORY — DX: Headache, unspecified: R51.9

## 2017-12-26 LAB — BASIC METABOLIC PANEL
Anion gap: 6 (ref 5–15)
BUN: 11 mg/dL (ref 6–20)
CO2: 30 mmol/L (ref 22–32)
CREATININE: 0.64 mg/dL (ref 0.44–1.00)
Calcium: 9.1 mg/dL (ref 8.9–10.3)
Chloride: 97 mmol/L — ABNORMAL LOW (ref 98–111)
GFR calc Af Amer: >60 mL/min (ref >60)
Glucose, Bld: 327 mg/dL — ABNORMAL HIGH (ref 70–99)
Potassium: 4 mmol/L (ref 3.5–5.1)
SODIUM: 133 mmol/L — AB (ref 135–145)

## 2017-12-26 LAB — POCT URINALYSIS DIPSTICK
Bilirubin, UA: NEGATIVE
Glucose, UA: POSITIVE — AB
Leukocytes, UA: NEGATIVE
Nitrite, UA: NEGATIVE
PROTEIN UA: NEGATIVE
RBC UA: NEGATIVE
SPEC GRAV UA: 1.015 (ref 1.010–1.025)
Urobilinogen, UA: 1 E.U./dL
pH, UA: 6 (ref 5.0–8.0)

## 2017-12-26 LAB — GLUCOSE, CAPILLARY: Glucose-Capillary: 324 mg/dL — ABNORMAL HIGH (ref 70–99)

## 2017-12-26 MED ORDER — KETOROLAC TROMETHAMINE 60 MG/2ML IM SOLN
60.0000 mg | Freq: Once | INTRAMUSCULAR | Status: AC
  Administered 2017-12-26: 60 mg via INTRAMUSCULAR

## 2017-12-26 MED ORDER — ASPIRIN-ACETAMINOPHEN-CAFFEINE 250-250-65 MG PO TABS: 2.0000 | ORAL_TABLET | Freq: Four times a day (QID) | ORAL | 0 refills | Status: AC | PRN

## 2017-12-26 NOTE — Progress Notes (Signed)
   CC: hyperglycemia and headache  HPI:   [name] is a 42 y.o. female with a medical history of insulin-dependent diabetes, asthma, OSA on CPAP, and RA who presents to the internal medicine clinic for elevated blood sugars in the setting of recent prednisone use for an asthma exacerbation as well as a new headache that started yesterday. Please see problem based charting for the status of the patient's current and chronic medical conditions.   Past Medical History:  Diagnosis Date  . Arthritis    major joints  . Asthma    prn inhaler  . Carpal tunnel syndrome of right wrist 09/2012  . Complication of anesthesia    states is hard to wake up post-op  . Diabetes mellitus    intolerant to Metformin  . Dry skin    hands   . Family history of anesthesia complication    mother went into coma during c-section  . GERD (gastroesophageal reflux disease)   . Herpes genital   . Morbid obesity with BMI of 50 to 60 07/23/2009  . Obesity hypoventilation syndrome ([place]) 10/30/2012  . Obesity, morbid ([place]) 01/10/2014  . OSA on CPAP     AHI was on  11-17-12 to 120/hr, titrated to 15 cm with 3 cm EPR/   . PCOS (polycystic ovarian syndrome)   . Rheumatoid arthritis ([place])   . Seasonal allergies   . Sleep apnea, obstructive    uses CPAP nightly - had sleep study 08/22/2007    Review of Systems:   Pertinent positives mentioned in HPI. Remainder of all ROS negative.   Physical Exam: Vitals:   12/26/17 1402  BP: 139/70  Pulse: 96  Temp: 98.9 F (37.2 C)  TempSrc: Oral  SpO2: 100%  Weight: (!) 318 lb 9.6 oz (144.5 kg)  Height: 5' 2"  (1.575 m)   Physical Exam  Constitutional: Well-developed and well-nourished. Patient appears distressed and tearful due to head pain. Neuro: Alert and oriented x3. Cranial nerves II-XII are intact. Normal strength and sensation throughout.  Cardiovascular: Normal rate and regular rhythm. No murmurs, rubs, or gallops. Pulmonary/Chest: Effort  normal. Clear to auscultation bilaterally. No wheezes, rales, or rhonchi. Abdominal: Bowel sounds present. Soft, non-distended, non-tender. Ext: No lower extremity edema. Skin: Warm and dry. No rashes or wounds.   Assessment & Plan:   See Encounters Tab for problem based charting.  Patient seen with Dr. Rebeca Alert

## 2017-12-26 NOTE — Assessment & Plan Note (Addendum)
HPI: [name] was seen in [facility] on 10/31 for cough and shortness of breath. She was started on a 5-day course of prednisone 80m for an asthma exacerbation and advised to continue with her home asthma inhalers. She has completed 3 days of prednisone, but has has had elevated blood sugars while on the steroid. Her normal diabetes regimen includes januvia, glipizide 190mBID, and 73u of lantus daily. Her most recent A1c on 10/13/17 was 11.5. On day 1 of steroids her lantus was increased by 10% to 80u for hyperglycemia above 200. On day 3 of steroids, she called with glucoses in the 400s and vaginal itching. At this time, her lantus was increased by 10% again to 88u daily and she was started on a three-day course of diflucan for suspected yeast infection. Despite the increase in insulin, her blood sugar was 413 this morning. She was advised to take an extra dose of 20u lantus and [facility]. She has not had any prednisone yet today.   Her only symptoms at this time are headache and coughing. She denies abdominal pain and n/v. She reports that she has been drinking a lot of sugar-free fluids to try to keep herself dehydrated. She has increased urine output which she attributes to her increased fluid intake.   Assessment: Glucose: 324 Urine dipstick: glucose > 1,000, trace ketones, negative nitrites, negative leukocytes BMP: Na 133, K 4.0, Cl 97, CO2 30, glucose 327, BUN 11, Cr 0.64, Ca 9.1 Glucometer data shows that her sugars normally range between 300 and 350 when not on prednisone.   Based on the patient's reassuring lab work and current glucose reading, which is within her baseline range, as well as her reluctance to be hospitalized, I believe this patient does not require admission at this time. However, she has been advised to call our clinic if her blood sugars rise above 400 for consideration of inpatient management of her asthma exacerbation and hyperglycemia. As  she still requires prednisone for her asthma exacerbation, I will increase her insulin regimen for the remainder of her steroid course. When the steroid course is over, she can transition back to her normal dosing. As she is suboptimally controlled even without steroids, she will need close follow-up with her PCP for diabetes management. Because her daily basal dose is so high, she may benefit from splitting the Lantus into BID dosing.    Plan 1. Split 88u of Lantus in 44u BID while on the prednisone. Return to 73u daily of lantus when prednisone is finished. 2. Continue glipizide and januvia without dosing changes 3. Call clinic if sugars rise above 400  2. F/u with PCP for diabetes management in about 1 month

## 2017-12-26 NOTE — Assessment & Plan Note (Signed)
HPI: The patient was seen on 10/31 for cough and dyspnea and started on a 5-day course of prednisone for asthma exacerbation. She has taken 3 days of prednisone. She was prescribed 32m daily, but has been taking 233mBID to help her sleep at night. She has been using Dulera BID with a spacer and doing home nebulizer treatments when needed. She reports that her symptoms have begun to improve while on the prednisone as she only feels dyspneic with exertion now. She has continued to have attacks of chest tightness and coughing with her last one this morning. Her viral-like symptoms including nasal drainage and sore throat have also been improving. The patient reports that she is usually hospitalized once per year for an asthma exacerbation, but hasn't been hospitalized since 2018.   Assessment: On exam, the patient is not coughing and her lungs sound clear. I think she is improving from her acute asthma exacerbation and viral illness, but requires two more days of prednisone to complete her course.   Plan 1. Continue prednisone 2049mID for two more days with increased insulin dosage 2. Continue Dulera BID 3. Continue rescue inhaler

## 2017-12-26 NOTE — Telephone Encounter (Signed)
Pt called back wanting to know what to do about her blood sugar, pt is on steroids, blood sugar in 520's.  Will route to Butch Penny and PharmD .  [place]offe[place], RN

## 2017-12-26 NOTE — Telephone Encounter (Signed)
Called pt back and BG came down to [phone], she requested an appointment with [facility] and is scheduled to come in today.  Thank you!

## 2017-12-26 NOTE — Telephone Encounter (Signed)
   Reason for call:   I received a call from [name] at 4 AM indicating that her blood sugar is high at 413.   Pertinent Data:   Patient has an history of asthma and diabetes and normally takes Lantus 80 units at bedtime along with Januvia.  Patient was prescribed prednisone 40 mg daily for 5 days on Thursday, December 22, 2017 due to asthma exacerbation, due to prednisone her blood sugar remained high, she called yesterday and was advised to increase her prednisone by 10% from 80 to 88 unit.  Last night her blood sugar was 525 and she uses 88 units of Lantus, this morning at 4 AM her blood sugar was 413 and she was also complaining of some headache.  Denies any nausea or vomiting or abdominal pain.  She denies any blurry vision.   Assessment / Plan / Recommendations:   Advised to take another 20 units of Lantus as she does not have any NovoLog at home, she was also given an option either come to emergency room or go to clinic in the morning.  Patient most likely come to the clinic in the morning.  As always, pt is advised that if symptoms worsen or new symptoms arise, they should go to an urgent care facility or to to ER for further evaluation.   Ana Nimrod, MD   12/26/2017, 4:14 AM

## 2017-12-26 NOTE — Assessment & Plan Note (Addendum)
The patient reports that she has had a headache since yesterday that is stabbing in nature. The pain is mostly behind the left eye, but she also has tightness in her neck. The pain is worsened with coughing and moving. She endorses photophobia and phonophobia, but denies n/v or changes in vision. She has tried tylenol for the pain, which has not been helpful. She reports that her last migraine was 15 years ago. When she had migraines in the past, excedrin helped.   Assessment: The patient's headache is consistent with a migraine, which is most likely secondary to hyperglycemia. She has no focal neurologic deficits. She received IM Toradol in clinic with improvement in her headache. Will prescribe the patient excedrin since this has helped in the past. Will treat her hyperglycemia as well prevent further headaches.  Plan 1. Excedrin migraine 2 tablets q6hrs PRN for headache

## 2017-12-26 NOTE — Patient Instructions (Addendum)
It was a pleasure taking care of you today, Ana Long!  - After checking lab work that showed [place] your electrolytes, the acidity of your blood, and your kidney function, we feel you are safe to be discharged home. However, please call our clinic if your sugars continue to be over 400 or if your breathing worsens. If that is the case, you may need to be admitted to the hospital.  - Instead of taking 88 units of lantus once a day, split this dose into 44 units in the morning and 44 units in the evening. Continue taking your glipizide and januvia as normally dosed. Continue checking your blood sugars to ensure that they are below 400.  - Continue taking the prednisone for two more days to finish your course. It is okay to take 59m twice a day instead of 450monce a day if that helps you sleep.   - When you finish the prednisone, go back to your normal regimen of 73u Lantus once per day.   - We gave you toradol for your headache in clinic today, which seemed to help the pain. I have prescribed excedrin for you to take at home if your headache returns.   - I will call you later this week to check in. However, please call our clinic at [phone] your headache, breathing, or blood sugars worsen.   - You should follow-up with your PCP within the next month to discuss your diabetes.  Thanks! Dr. DoAnnie Paras

## 2017-12-28 ENCOUNTER — Encounter (INDEPENDENT_AMBULATORY_CARE_PROVIDER_SITE_OTHER): Payer: Medicaid Other | Admitting: Ophthalmology

## 2017-12-30 NOTE — Addendum Note (Signed)
Addended by: Oval Linsey D on: 12/30/2017 02:40 PM   Modules accepted: Level of Service

## 2017-12-30 NOTE — Progress Notes (Signed)
Patient ID: Ana Long, female   DOB: 1975-06-26, 42 y.o.   MRN: [id number]  I saw and evaluated the patient.  I personally confirmed the key portions of Dr. Nelma Rothman history and exam and reviewed pertinent patient test results.  The assessment, diagnosis, and plan were formulated together and I agree with the documentation in the resident's note.

## 2018-01-01 NOTE — Progress Notes (Signed)
Internal Medicine Clinic Attending  I saw and evaluated the patient.  I personally confirmed the key portions of the history and exam documented by Dr. Annie Paras and I reviewed pertinent patient test results.  The assessment, diagnosis, and plan were formulated together and I agree with the documentation in the resident's note.  Lenice Pressman, M.D., Ph.D.

## 2018-01-04 ENCOUNTER — Encounter (INDEPENDENT_AMBULATORY_CARE_PROVIDER_SITE_OTHER): Payer: Medicaid Other | Admitting: Ophthalmology

## 2018-01-11 ENCOUNTER — Telehealth: Payer: Self-pay | Admitting: Internal Medicine

## 2018-01-11 ENCOUNTER — Other Ambulatory Visit: Payer: Self-pay

## 2018-01-11 ENCOUNTER — Inpatient Hospital Stay (HOSPITAL_COMMUNITY)
Admission: EM | Admit: 2018-01-11 | Discharge: 2018-01-15 | DRG: 202 | Disposition: A | Discharge status: Home / Self Care | Payer: Medicaid Other | Attending: Internal Medicine | Admitting: Internal Medicine

## 2018-01-11 ENCOUNTER — Emergency Department (HOSPITAL_COMMUNITY): Payer: Medicaid Other

## 2018-01-11 ENCOUNTER — Encounter (HOSPITAL_COMMUNITY): Payer: Self-pay

## 2018-01-11 DIAGNOSIS — M069 Rheumatoid arthritis, unspecified: Secondary | ICD-10-CM | POA: Diagnosis present

## 2018-01-11 DIAGNOSIS — Z794 Long term (current) use of insulin: Secondary | ICD-10-CM

## 2018-01-11 DIAGNOSIS — R3915 Urgency of urination: Secondary | ICD-10-CM | POA: Diagnosis not present

## 2018-01-11 DIAGNOSIS — Z91048 Other nonmedicinal substance allergy status: Secondary | ICD-10-CM

## 2018-01-11 DIAGNOSIS — G4733 Obstructive sleep apnea (adult) (pediatric): Secondary | ICD-10-CM

## 2018-01-11 DIAGNOSIS — R0902 Hypoxemia: Secondary | ICD-10-CM | POA: Diagnosis present

## 2018-01-11 DIAGNOSIS — Z888 Allergy status to other drugs, medicaments and biological substances status: Secondary | ICD-10-CM

## 2018-01-11 DIAGNOSIS — Z791 Long term (current) use of non-steroidal anti-inflammatories (NSAID): Secondary | ICD-10-CM | POA: Diagnosis not present

## 2018-01-11 DIAGNOSIS — Z91040 Latex allergy status: Secondary | ICD-10-CM | POA: Diagnosis not present

## 2018-01-11 DIAGNOSIS — Z79899 Other long term (current) drug therapy: Secondary | ICD-10-CM | POA: Diagnosis not present

## 2018-01-11 DIAGNOSIS — E282 Polycystic ovarian syndrome: Secondary | ICD-10-CM | POA: Diagnosis present

## 2018-01-11 DIAGNOSIS — Z820 Family history of epilepsy and other diseases of the nervous system: Secondary | ICD-10-CM

## 2018-01-11 DIAGNOSIS — K5900 Constipation, unspecified: Secondary | ICD-10-CM | POA: Diagnosis not present

## 2018-01-11 DIAGNOSIS — Z6841 Body Mass Index (BMI) 40.0 and over, adult: Secondary | ICD-10-CM

## 2018-01-11 DIAGNOSIS — Z91013 Allergy to seafood: Secondary | ICD-10-CM

## 2018-01-11 DIAGNOSIS — Z7952 Long term (current) use of systemic steroids: Secondary | ICD-10-CM

## 2018-01-11 DIAGNOSIS — E785 Hyperlipidemia, unspecified: Secondary | ICD-10-CM | POA: Diagnosis present

## 2018-01-11 DIAGNOSIS — Z806 Family history of leukemia: Secondary | ICD-10-CM

## 2018-01-11 DIAGNOSIS — E118 Type 2 diabetes mellitus with unspecified complications: Secondary | ICD-10-CM | POA: Diagnosis not present

## 2018-01-11 DIAGNOSIS — Z880 Allergy status to penicillin: Secondary | ICD-10-CM

## 2018-01-11 DIAGNOSIS — Z833 Family history of diabetes mellitus: Secondary | ICD-10-CM

## 2018-01-11 DIAGNOSIS — K219 Gastro-esophageal reflux disease without esophagitis: Secondary | ICD-10-CM | POA: Diagnosis present

## 2018-01-11 DIAGNOSIS — Z8249 Family history of ischemic heart disease and other diseases of the circulatory system: Secondary | ICD-10-CM

## 2018-01-11 DIAGNOSIS — H6122 Impacted cerumen, left ear: Secondary | ICD-10-CM | POA: Diagnosis present

## 2018-01-11 DIAGNOSIS — Z9049 Acquired absence of other specified parts of digestive tract: Secondary | ICD-10-CM

## 2018-01-11 DIAGNOSIS — E113299 Type 2 diabetes mellitus with mild nonproliferative diabetic retinopathy without macular edema, unspecified eye: Secondary | ICD-10-CM | POA: Diagnosis present

## 2018-01-11 DIAGNOSIS — Z91018 Allergy to other foods: Secondary | ICD-10-CM | POA: Diagnosis not present

## 2018-01-11 DIAGNOSIS — N898 Other specified noninflammatory disorders of vagina: Secondary | ICD-10-CM | POA: Diagnosis not present

## 2018-01-11 DIAGNOSIS — J4551 Severe persistent asthma with (acute) exacerbation: Secondary | ICD-10-CM | POA: Diagnosis present

## 2018-01-11 DIAGNOSIS — E1165 Type 2 diabetes mellitus with hyperglycemia: Secondary | ICD-10-CM

## 2018-01-11 DIAGNOSIS — Z91030 Bee allergy status: Secondary | ICD-10-CM

## 2018-01-11 DIAGNOSIS — J45901 Unspecified asthma with (acute) exacerbation: Secondary | ICD-10-CM

## 2018-01-11 DIAGNOSIS — R1013 Epigastric pain: Secondary | ICD-10-CM | POA: Diagnosis not present

## 2018-01-11 DIAGNOSIS — Z91038 Other insect allergy status: Secondary | ICD-10-CM | POA: Diagnosis not present

## 2018-01-11 DIAGNOSIS — R0601 Orthopnea: Secondary | ICD-10-CM | POA: Diagnosis not present

## 2018-01-11 DIAGNOSIS — J4541 Moderate persistent asthma with (acute) exacerbation: Secondary | ICD-10-CM | POA: Diagnosis not present

## 2018-01-11 DIAGNOSIS — Z9102 Food additives allergy status: Secondary | ICD-10-CM

## 2018-01-11 DIAGNOSIS — R0602 Shortness of breath: Secondary | ICD-10-CM | POA: Diagnosis not present

## 2018-01-11 DIAGNOSIS — Z881 Allergy status to other antibiotic agents status: Secondary | ICD-10-CM

## 2018-01-11 DIAGNOSIS — Z87891 Personal history of nicotine dependence: Secondary | ICD-10-CM

## 2018-01-11 DIAGNOSIS — Z885 Allergy status to narcotic agent status: Secondary | ICD-10-CM | POA: Diagnosis not present

## 2018-01-11 DIAGNOSIS — R079 Chest pain, unspecified: Secondary | ICD-10-CM | POA: Diagnosis not present

## 2018-01-11 DIAGNOSIS — M7989 Other specified soft tissue disorders: Secondary | ICD-10-CM

## 2018-01-11 DIAGNOSIS — E662 Morbid (severe) obesity with alveolar hypoventilation: Secondary | ICD-10-CM | POA: Diagnosis present

## 2018-01-11 DIAGNOSIS — Z9989 Dependence on other enabling machines and devices: Secondary | ICD-10-CM

## 2018-01-11 HISTORY — DX: Severe persistent asthma with (acute) exacerbation: J45.51

## 2018-01-11 LAB — GLUCOSE, CAPILLARY
GLUCOSE-CAPILLARY: 590 mg/dL — AB (ref 70–99)
Glucose-Capillary: 371 mg/dL — ABNORMAL HIGH (ref 70–99)
Glucose-Capillary: 484 mg/dL — ABNORMAL HIGH (ref 70–99)

## 2018-01-11 LAB — URINALYSIS, ROUTINE W REFLEX MICROSCOPIC
BACTERIA UA: NONE SEEN
BILIRUBIN URINE: NEGATIVE
Glucose, UA: >=500 mg/dL — AB
HGB URINE DIPSTICK: NEGATIVE
Ketones, ur: 5 mg/dL — AB
NITRITE: NEGATIVE
PROTEIN: NEGATIVE mg/dL
Specific Gravity, Urine: 1.033 — ABNORMAL HIGH (ref 1.005–1.030)
pH: 6 (ref 5.0–8.0)

## 2018-01-11 LAB — COMPREHENSIVE METABOLIC PANEL
ALBUMIN: 3.4 g/dL — AB (ref 3.5–5.0)
ALT: 24 U/L (ref 0–44)
ANION GAP: 8 (ref 5–15)
AST: 23 U/L (ref 15–41)
Alkaline Phosphatase: 44 U/L (ref 38–126)
BUN: 7 mg/dL (ref 6–20)
CHLORIDE: 98 mmol/L (ref 98–111)
CO2: 27 mmol/L (ref 22–32)
Calcium: 9 mg/dL (ref 8.9–10.3)
Creatinine, Ser: 0.68 mg/dL (ref 0.44–1.00)
GFR calc Af Amer: >60 mL/min (ref >60)
GFR calc non Af Amer: >60 mL/min (ref >60)
GLUCOSE: 406 mg/dL — AB (ref 70–99)
POTASSIUM: 3.9 mmol/L (ref 3.5–5.1)
Sodium: 133 mmol/L — ABNORMAL LOW (ref 135–145)
TOTAL PROTEIN: 7.3 g/dL (ref 6.5–8.1)
Total Bilirubin: 0.4 mg/dL (ref 0.3–1.2)

## 2018-01-11 LAB — CBC WITH DIFFERENTIAL/PLATELET
Abs Immature Granulocytes: 0.01 10*3/uL (ref 0.00–0.07)
BASOS ABS: 0 10*3/uL (ref 0.0–0.1)
Basophils Relative: 0 %
EOS ABS: 0.1 10*3/uL (ref 0.0–0.5)
EOS PCT: 1 %
HCT: 41.1 % (ref 36.0–46.0)
Hemoglobin: 12 g/dL (ref 12.0–15.0)
IMMATURE GRANULOCYTES: 0 %
LYMPHS ABS: 2.5 10*3/uL (ref 0.7–4.0)
LYMPHS PCT: 34 %
MCH: 24.7 pg — ABNORMAL LOW (ref 26.0–34.0)
MCHC: 29.2 g/dL — AB (ref 30.0–36.0)
MCV: 84.6 fL (ref 80.0–100.0)
Monocytes Absolute: 0.4 10*3/uL (ref 0.1–1.0)
Monocytes Relative: 6 %
NEUTROS PCT: 59 %
NRBC: 0 % (ref 0.0–0.2)
Neutro Abs: 4.2 10*3/uL (ref 1.7–7.7)
Platelets: 316 10*3/uL (ref 150–400)
RBC: 4.86 MIL/uL (ref 3.87–5.11)
RDW: 15.7 % — AB (ref 11.5–15.5)
WBC: 7.2 10*3/uL (ref 4.0–10.5)

## 2018-01-11 LAB — GLUCOSE, RANDOM: GLUCOSE: 569 mg/dL — AB (ref 70–99)

## 2018-01-11 LAB — LIPASE, BLOOD: LIPASE: 37 U/L (ref 11–51)

## 2018-01-11 LAB — D-DIMER, QUANTITATIVE (NOT AT ARMC): D DIMER QUANT: 0.28 ug{FEU}/mL (ref 0.00–0.50)

## 2018-01-11 LAB — BRAIN NATRIURETIC PEPTIDE: B NATRIURETIC PEPTIDE 5: 15.7 pg/mL (ref 0.0–100.0)

## 2018-01-11 LAB — MRSA PCR SCREENING: MRSA by PCR: POSITIVE — AB

## 2018-01-11 LAB — I-STAT TROPONIN, ED: Troponin i, poc: 0 ng/mL (ref 0.00–0.08)

## 2018-01-11 LAB — CBG MONITORING, ED: GLUCOSE-CAPILLARY: 325 mg/dL — AB (ref 70–99)

## 2018-01-11 MED ORDER — GUAIFENESIN 100 MG/5ML PO SOLN
5.0000 mL | ORAL | Status: DC | PRN
  Administered 2018-01-11: 100 mg via ORAL
  Filled 2018-01-11: qty 5

## 2018-01-11 MED ORDER — POLYETHYLENE GLYCOL 3350 17 G PO PACK
17.0000 g | PACK | Freq: Every day | ORAL | Status: DC
  Administered 2018-01-11 – 2018-01-13 (×3): 17 g via ORAL
  Filled 2018-01-11 (×3): qty 1

## 2018-01-11 MED ORDER — MONTELUKAST SODIUM 10 MG PO TABS
10.0000 mg | ORAL_TABLET | Freq: Every day | ORAL | Status: DC
  Administered 2018-01-11 – 2018-01-14 (×4): 10 mg via ORAL
  Filled 2018-01-11 (×4): qty 1

## 2018-01-11 MED ORDER — ACETAMINOPHEN 650 MG RE SUPP: 650.0000 mg | Freq: Four times a day (QID) | RECTAL | Status: DC | PRN

## 2018-01-11 MED ORDER — MAGNESIUM SULFATE 2 GM/50ML IV SOLN
2.0000 g | Freq: Once | INTRAVENOUS | Status: AC
  Administered 2018-01-11: 2 g via INTRAVENOUS
  Filled 2018-01-11: qty 50

## 2018-01-11 MED ORDER — ALBUTEROL SULFATE (2.5 MG/3ML) 0.083% IN NEBU: 2.5000 mg | INHALATION_SOLUTION | Freq: Once | RESPIRATORY_TRACT | Status: DC

## 2018-01-11 MED ORDER — ATORVASTATIN CALCIUM 20 MG PO TABS: 10.0000 mg | ORAL_TABLET | Freq: Every day | ORAL | Status: DC

## 2018-01-11 MED ORDER — INSULIN ASPART 100 UNIT/ML ~~LOC~~ SOLN
0.0000 [IU] | Freq: Three times a day (TID) | SUBCUTANEOUS | Status: DC
  Administered 2018-01-11 – 2018-01-13 (×6): 20 [IU] via SUBCUTANEOUS
  Administered 2018-01-13: 11 [IU] via SUBCUTANEOUS
  Administered 2018-01-14: 15 [IU] via SUBCUTANEOUS
  Administered 2018-01-14: 7 [IU] via SUBCUTANEOUS
  Administered 2018-01-14: 11 [IU] via SUBCUTANEOUS
  Administered 2018-01-15: 15 [IU] via SUBCUTANEOUS
  Administered 2018-01-15: 11 [IU] via SUBCUTANEOUS
  Administered 2018-01-15: 15 [IU] via SUBCUTANEOUS

## 2018-01-11 MED ORDER — IPRATROPIUM-ALBUTEROL 0.5-2.5 (3) MG/3ML IN SOLN
3.0000 mL | Freq: Once | RESPIRATORY_TRACT | Status: AC
  Administered 2018-01-11: 3 mL via RESPIRATORY_TRACT
  Filled 2018-01-11: qty 3

## 2018-01-11 MED ORDER — BENZONATATE 100 MG PO CAPS
200.0000 mg | ORAL_CAPSULE | Freq: Three times a day (TID) | ORAL | Status: DC | PRN
  Administered 2018-01-11 – 2018-01-15 (×8): 200 mg via ORAL
  Filled 2018-01-11 (×8): qty 2

## 2018-01-11 MED ORDER — ONDANSETRON HCL 4 MG/2ML IJ SOLN: 4.0000 mg | Freq: Four times a day (QID) | INTRAMUSCULAR | Status: DC | PRN

## 2018-01-11 MED ORDER — ENOXAPARIN SODIUM 40 MG/0.4ML ~~LOC~~ SOLN
40.0000 mg | SUBCUTANEOUS | Status: DC
  Administered 2018-01-11: 40 mg via SUBCUTANEOUS
  Filled 2018-01-11: qty 0.4

## 2018-01-11 MED ORDER — PREDNISONE 20 MG PO TABS: 40.0000 mg | ORAL_TABLET | Freq: Every day | ORAL | Status: DC

## 2018-01-11 MED ORDER — METHOCARBAMOL 500 MG PO TABS: 500.0000 mg | ORAL_TABLET | Freq: Three times a day (TID) | ORAL | Status: DC | PRN

## 2018-01-11 MED ORDER — ALBUTEROL SULFATE (2.5 MG/3ML) 0.083% IN NEBU: 2.5000 mg | INHALATION_SOLUTION | RESPIRATORY_TRACT | Status: DC | PRN

## 2018-01-11 MED ORDER — ONDANSETRON HCL 4 MG PO TABS: 4.0000 mg | ORAL_TABLET | Freq: Four times a day (QID) | ORAL | Status: DC | PRN

## 2018-01-11 MED ORDER — METHYLPREDNISOLONE SODIUM SUCC 125 MG IJ SOLR
125.0000 mg | Freq: Once | INTRAMUSCULAR | Status: AC
  Administered 2018-01-11: 125 mg via INTRAVENOUS
  Filled 2018-01-11: qty 2

## 2018-01-11 MED ORDER — PANTOPRAZOLE SODIUM 40 MG PO TBEC
40.0000 mg | DELAYED_RELEASE_TABLET | Freq: Every day | ORAL | Status: DC
  Administered 2018-01-12 – 2018-01-15 (×4): 40 mg via ORAL
  Filled 2018-01-11 (×4): qty 1

## 2018-01-11 MED ORDER — ACETAMINOPHEN 325 MG PO TABS
650.0000 mg | ORAL_TABLET | Freq: Four times a day (QID) | ORAL | Status: DC | PRN
  Administered 2018-01-13 – 2018-01-15 (×4): 650 mg via ORAL
  Filled 2018-01-11 (×4): qty 2

## 2018-01-11 MED ORDER — ORAL CARE MOUTH RINSE
15.0000 mL | Freq: Two times a day (BID) | OROMUCOSAL | Status: DC
  Administered 2018-01-11 – 2018-01-14 (×4): 15 mL via OROMUCOSAL

## 2018-01-11 MED ORDER — IPRATROPIUM-ALBUTEROL 0.5-2.5 (3) MG/3ML IN SOLN
3.0000 mL | Freq: Four times a day (QID) | RESPIRATORY_TRACT | Status: DC
  Administered 2018-01-11 – 2018-01-15 (×16): 3 mL via RESPIRATORY_TRACT
  Filled 2018-01-11 (×17): qty 3

## 2018-01-11 MED ORDER — INSULIN GLARGINE 100 UNIT/ML ~~LOC~~ SOLN
30.0000 [IU] | Freq: Two times a day (BID) | SUBCUTANEOUS | Status: DC
  Administered 2018-01-11 – 2018-01-12 (×2): 30 [IU] via SUBCUTANEOUS
  Filled 2018-01-11 (×3): qty 0.3

## 2018-01-11 MED ORDER — LORATADINE 10 MG PO TABS
10.0000 mg | ORAL_TABLET | Freq: Two times a day (BID) | ORAL | Status: DC
  Administered 2018-01-11 – 2018-01-15 (×8): 10 mg via ORAL
  Filled 2018-01-11 (×8): qty 1

## 2018-01-11 MED ORDER — PREDNISONE 10 MG PO TABS
50.0000 mg | ORAL_TABLET | Freq: Every day | ORAL | Status: DC
  Administered 2018-01-12 – 2018-01-13 (×2): 50 mg via ORAL
  Filled 2018-01-11 (×2): qty 2

## 2018-01-11 MED ORDER — MOMETASONE FURO-FORMOTEROL FUM 200-5 MCG/ACT IN AERO
2.0000 | INHALATION_SPRAY | Freq: Two times a day (BID) | RESPIRATORY_TRACT | Status: DC
  Administered 2018-01-11 – 2018-01-15 (×8): 2 via RESPIRATORY_TRACT
  Filled 2018-01-11: qty 8.8

## 2018-01-11 MED ORDER — INSULIN ASPART 100 UNIT/ML ~~LOC~~ SOLN
0.0000 [IU] | Freq: Every day | SUBCUTANEOUS | Status: DC
  Administered 2018-01-11: 25 [IU] via SUBCUTANEOUS
  Administered 2018-01-12 – 2018-01-13 (×2): 5 [IU] via SUBCUTANEOUS
  Administered 2018-01-14: 4 [IU] via SUBCUTANEOUS

## 2018-01-11 NOTE — ED Notes (Signed)
Pt returned to room from xray.

## 2018-01-11 NOTE — ED Notes (Signed)
ED Provider at bedside. 

## 2018-01-11 NOTE — ED Provider Notes (Signed)
[facility] EMERGENCY DEPARTMENT Provider Note   CSN: [id number] Arrival date & time: 01/11/18  1410     History   Chief Complaint Chief Complaint  Patient presents with  . Cough  . Chest Pain    HPI Ana Long is a 42 y.o. female.  The history is provided by the patient and medical records. No language interpreter was used.  Shortness of Breath  This is a new problem. The average episode lasts 4 days. The problem occurs continuously.The current episode started more than 2 days ago. The problem has been gradually worsening. Associated symptoms include cough, sputum production, wheezing, chest pain and leg swelling. Pertinent negatives include no fever, no headaches, no rhinorrhea, no neck pain, no hemoptysis, no syncope, no vomiting, no abdominal pain, no rash, no leg pain and no claudication. Risk factors include recent prolonged sitting (train ride). She has tried beta-agonist inhalers for the symptoms. Associated medical issues include asthma.    Past Medical History:  Diagnosis Date  . Arthritis    major joints  . Asthma    prn inhaler  . Carpal tunnel syndrome of right wrist 09/2012  . Complication of anesthesia    states is hard to wake up post-op  . Diabetes mellitus    intolerant to Metformin  . Dry skin    hands   . Family history of anesthesia complication    mother went into coma during c-section  . GERD (gastroesophageal reflux disease)   . Herpes genital   . Morbid obesity with BMI of 50 to 60 07/23/2009  . Obesity hypoventilation syndrome ([place]) 10/30/2012  . Obesity, morbid ([place]) 01/10/2014  . OSA on CPAP     AHI was on  11-17-12 to 120/hr, titrated to 15 cm with 3 cm EPR/   . PCOS (polycystic ovarian syndrome)   . Rheumatoid arthritis ([place])   . Seasonal allergies   . Sleep apnea, obstructive    uses CPAP nightly - had sleep study 08/22/2007    Patient Active Problem List   Diagnosis Date Noted  . Hyperglycemia 12/26/2017    . Acute nonintractable headache 12/26/2017  . Mild episode of recurrent major depressive disorder ([place]) 10/13/2017  . Uncontrolled type 2 diabetes mellitus without complication, with long-term current use of insulin ([place]) 09/05/2017  . Right hip pain 09/04/2017  . Achilles tendinitis of both lower extremities 07/24/2017  . Right shoulder pain 06/01/2017  . 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 Menorrhagia 11/06/2015  . Right ankle pain 06/19/2015  . Second degree uterine prolapse 02/12/2015  . Dyspareunia in female 01/24/2015  . Constipation 12/30/2014  . Urinary retention 12/09/2014  . Non-proliferative diabetic retinopathy ([place]) 04/22/2014  . Asthma exacerbation 07/25/2013  . 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 Seasonal allergies 01/17/2013  . OSA on CPAP   . Low back pain 11/22/2012  . Obesity hypoventilation syndrome ([place]) 10/30/2012  . Lower extremity edema 10/18/2012  . DUB (dysfunctional uterine bleeding) 03/01/2012  . Hypersomnia with sleep apnea   . Asthma 07/06/2011  . Osteoarthritis, knee 02/03/2010  . Morbid obesity with BMI of 50 to 60 07/23/2009  . Genital herpes 01/01/2006  . Uncontrolled type 2 diabetes mellitus with hyperglycemia, with long-term current use of insulin ([place]) 01/01/2006  . Hyperlipidemia 01/01/2006  . GERD 01/01/2006    Past Surgical History:  Procedure Laterality Date  . CARPAL TUNNEL RELEASE Right 10/02/2012   Procedure: RIGHT CARPAL TUNNEL RELEASE ENDOSCOPIC;  Surgeon: Jolyn Nap, MD;  Location: [facility];  Service: Orthopedics;  Laterality: Right;  . CHOLECYSTECTOMY N/A 12/02/2014   Procedure: LAPAROSCOPIC CHOLECYSTECTOMY;  Surgeon: Armandina Gemma, MD;  Location: [facility];  Service: General;  Laterality: N/A;  . HYSTEROSCOPY W/D&C  01-08-2002     OB History    Gravida  0   Para  0   Term  0   Preterm  0   AB  0   Living  0     SAB  0   TAB  0   Ectopic  0   Multiple  0   Live Births  0             Home Medications    Prior to Admission medications   Medication Sig Start Date End Date Taking? Authorizing Provider  acyclovir (ZOVIRAX) 400 MG tablet Take 1 tablet (400 mg total) by mouth 2 (two) times daily. 09/05/17   Sid Falcon, MD  albuterol (PROAIR HFA) [phone] Base) MCG/ACT inhaler Inhale 2 puffs into the lungs every 4 (four) hours as needed for wheezing or shortness of breath (cough). 05/24/16   Holley Raring, MD  atorvastatin (LIPITOR) 10 MG tablet Take 1 tablet (10 mg total) by mouth daily. 09/21/17   Ina Homes, MD  diclofenac (VOLTAREN) 75 MG EC tablet Take 1 tablet (75 mg total) by mouth 2 (two) times daily. 05/30/17   Hudnall, Sharyn Lull, MD  diclofenac sodium (VOLTAREN) 1 % GEL Apply 2 g topically 4 (four) times daily as needed. 07/29/15   Wilson, Alex M, DO  DULERA 200-5 MCG/ACT AERO INHALE 2 PUFFS INTO THE LUNGS 2 (TWO) TIMES DAILY. RINSE MOUTH AFTER EACH USE 12/26/17   Ina Homes, MD  glipiZIDE (GLUCOTROL) 10 MG tablet Take 1 tablet (10 mg total) by mouth 2 (two) times daily before a meal. 09/05/17   Sid Falcon, MD  glucose blood (ACCU-CHEK SMARTVIEW) test strip TEST THREE TIMES DAILY 09/05/17   Ina Homes, MD  ibuprofen (ADVIL,MOTRIN) 600 MG tablet Take 1 tablet (600 mg total) by mouth every 8 (eight) hours as needed. 06/06/17   Hudnall, Sharyn Lull, MD  Insulin Glargine (LANTUS SOLOSTAR) 100 UNIT/ML Solostar Pen Inject 73 Units into the skin every morning. 09/05/17   Sid Falcon, MD  Insulin Pen Needle (B-D UF III MINI PEN NEEDLES) 31G X 5 MM MISC USE AS DIRECTED TO INJECT INSULIN TWICE A DAY E11.65 09/01/17   Ina Homes, MD  lactulose (CEPHULAC) 10 g packet Take 1 packet (10 g total) by mouth daily as needed. Reported on 07/29/2015 09/05/17   Sid Falcon, MD  Lancets (ACCU-CHEK SOFT [facility]) lancets Use as instructed 10/13/17   Ina Homes, MD  loratadine (CLARITIN) 10 MG tablet Take 1 tablet (10 mg total) by mouth 2 (two) times daily. 11/08/17    Ina Homes, MD  meloxicam (MOBIC) 15 MG tablet Take 1 tablet (15 mg total) by mouth daily. 10/20/17   Hudnall, Sharyn Lull, MD  methocarbamol (ROBAXIN) 500 MG tablet Take 1 tablet (500 mg total) by mouth every 8 (eight) hours as needed. 10/20/17   Dene Gentry, MD  nitroGLYCERIN (NITRODUR - DOSED IN MG/24 HR) 0.2 mg/hr patch Apply 1/4th patch to affected achilles, change daily 07/21/17   Hudnall, Sharyn Lull, MD  omeprazole (PRILOSEC) 20 MG capsule Take 1 capsule (20 mg total) by mouth daily before breakfast. 09/05/17   Sid Falcon, MD  predniSONE (DELTASONE) 10 MG tablet Take 4 tablets (40 mg total)  by mouth daily with breakfast. 12/22/17   Kathi Ludwig, MD  Probiotic Product (PROBIOTIC DAILY PO) Take 2 capsules by mouth every evening.     [provider]  sitaGLIPtin (JANUVIA) 100 MG tablet Take 1 tablet (100 mg total) by mouth daily. 10/13/17   Ina Homes, MD  TURMERIC PO Take 2 tablets by mouth at bedtime. Takes generic turmeric    [provider]    Family History Family History  Problem Relation Age of Onset  . Other Mother   . Leukemia Cousin   . Other Other   . Obesity Other   . Other Other   . Diabetes Maternal Aunt   . Heart attack Maternal Aunt   . Alzheimer's disease Maternal Uncle   . Cancer Maternal Uncle        prostate  . Diabetes Paternal Aunt   . Cancer Paternal Aunt        brain,mouth  . Alzheimer's disease Paternal Aunt   . Diabetes Paternal Uncle   . Diabetes Maternal Grandmother   . Diabetes Maternal Grandfather     Social History Social History   Tobacco Use  . Smoking status: Former Smoker    Years: 10.00    Last attempt to quit: 05/24/2010    Years since quitting: 7.6  . Smokeless tobacco: Never Used  Substance Use Topics  . Alcohol use: Yes    Alcohol/week: 0.0 standard drinks    Comment: rarely  . Drug use: No     Allergies   Citrus; Latex; Penicillins; Soap; Tomato; Fluticasone; Hydrocodone; Other; Shellfish  allergy; Doxycycline; and Metformin and related   Review of Systems Review of Systems  Constitutional: Positive for chills. Negative for diaphoresis, fatigue and fever.  HENT: Negative for congestion and rhinorrhea.   Respiratory: Positive for cough, sputum production, chest tightness, shortness of breath and wheezing. Negative for hemoptysis and stridor.   Cardiovascular: Positive for chest pain and leg swelling. Negative for palpitations, claudication and syncope.  Gastrointestinal: Negative for abdominal pain, constipation, diarrhea, nausea and vomiting.  Genitourinary: Negative for dysuria and flank pain.  Musculoskeletal: Negative for back pain, neck pain and neck stiffness.  Skin: Negative for rash and wound.  Neurological: Negative for light-headedness and headaches.  Psychiatric/Behavioral: Negative for agitation.     Physical Exam Updated Vital Signs BP 130/75   Pulse 90   Resp 20   Ht 5' 2"  (1.575 m)   SpO2 99%   BMI 58.27 kg/m   Physical Exam  Constitutional: She appears well-developed and well-nourished.  Non-toxic appearance. She does not appear ill. No distress.  HENT:  Head: Normocephalic and atraumatic.  Eyes: Pupils are equal, round, and reactive to light. Conjunctivae are normal.  Neck: Normal range of motion. Neck supple.  Cardiovascular: Normal rate and regular rhythm.  No murmur heard. Pulmonary/Chest: Effort normal. Tachypnea noted. No respiratory distress. She has no decreased breath sounds. She has wheezes. She has no rhonchi. She has no rales. She exhibits tenderness.    Abdominal: Soft. There is no tenderness.  Musculoskeletal: She exhibits no edema.  Neurological: She is alert.  Skin: Skin is warm and dry.  Psychiatric: She has a normal mood and affect.  Nursing note and vitals reviewed.    ED Treatments / Results  Labs (all labs ordered are listed, but only abnormal results are displayed) Labs Reviewed  CBC WITH DIFFERENTIAL/PLATELET -  Abnormal; Notable for the following components:      Result Value   MCH 24.7 (*)  MCHC 29.2 (*)    RDW 15.7 (*)    All other components within normal limits  COMPREHENSIVE METABOLIC PANEL - Abnormal; Notable for the following components:   Sodium 133 (*)    Glucose, Bld 406 (*)    Albumin 3.4 (*)    All other components within normal limits  URINE CULTURE  BRAIN NATRIURETIC PEPTIDE  D-DIMER, QUANTITATIVE (NOT AT [facility])  LIPASE, BLOOD  URINALYSIS, ROUTINE W REFLEX MICROSCOPIC  I-STAT TROPONIN, ED    EKG EKG Interpretation  Date/Time:  Wednesday January 11 2018 14:23:18 EST Ventricular Rate:  99 PR Interval:    QRS Duration: 79 QT Interval:  324 QTC Calculation: 416 R Axis:   73 Text Interpretation:  Sinus rhythm Probable anteroseptal infarct, old When comapred to prior, no significant changes seen  No STEMI Confirmed by Antony Blackbird [phone]) on 01/11/2018 2:33:00 PM   Radiology Dg Chest 2 View  Result Date: 01/11/2018 CLINICAL DATA:  Chest pain; cough and wheezing EXAM: CHEST - 2 VIEW COMPARISON:  September 29, 2017 FINDINGS: There is no edema or consolidation. Heart size and pulmonary vascularity are normal. No adenopathy. No pneumothorax. No bone lesions. IMPRESSION: No edema or consolidation. Electronically Signed   By: Lowella Grip III M.D.   On: 01/11/2018 15:39    Procedures Procedures (including critical care time)  CRITICAL CARE Performed by: Gwenyth Allegra Omarrion Carmer Total critical care time: 35 minutes Critical care time was exclusive of separately billable procedures and treating other patients. Multiple breathing treatments, steroids, and magnesium for hypoxia and asthma exacerbation requiring admission. Critical care was necessary to treat or prevent imminent or life-threatening deterioration. Critical care was time spent personally by me on the following activities: development of treatment plan with patient and/or surrogate as well as nursing, discussions  with consultants, evaluation of patient's response to treatment, examination of patient, obtaining history from patient or surrogate, ordering and performing treatments and interventions, ordering and review of laboratory studies, ordering and review of radiographic studies, pulse oximetry and re-evaluation of patient's condition.   Medications Ordered in ED Medications  magnesium sulfate IVPB 2 g 50 mL (has no administration in time range)  ipratropium-albuterol (DUONEB) 0.5-2.5 (3) MG/3ML nebulizer solution 3 mL (has no administration in time range)  ipratropium-albuterol (DUONEB) 0.5-2.5 (3) MG/3ML nebulizer solution 3 mL (3 mLs Nebulization Given 01/11/18 1428)  methylPREDNISolone sodium succinate (SOLU-MEDROL) 125 mg/2 mL injection 125 mg (125 mg Intravenous Given 01/11/18 1436)     Initial Impression / Assessment and Plan / ED Course  I have reviewed the triage vital signs and the nursing notes.  Pertinent labs & imaging results that were available during my care of the patient were reviewed by me and considered in my medical decision making (see chart for details).     Erandy A Sifuentes-Ward is a 42 y.o. female with a past medical history significant for asthma, obesity, diabetes, hyperlipidemia, seasonal allergies, GERD, and rheumatoid arthritis who presents with shortness of breath, wheezing, chest pain, cough, urinary frequency, and bilateral leg swelling.  Patient reports that she took a train to [place] and just got back and has been having bilateral leg swelling since that time.  She reports that over the last several days she has been having worsening shortness of breath with wheezing that is not responding to her home albuterol.  She has used it 2 times since checking into the emergency department today.  She reports left-sided chest pain that is sharp and pleuritic.  She reports this pain  is new and not typically associated with her asthma exacerbations.  She reports she is  coughing up phlegm but no hemoptysis.  She reports the pain is moderate to severe.  She reports that she is having no nausea, vomiting, or diaphoresis.  She denies significant fevers or chills.  She denies any abdominal pain, conservation, diarrhea.  She does report some urinary frequency and hesitancy.  On exam, patient has diminished breath sounds and wheezing in all lung fields.  No significant rhonchi.  Left sternum and chest is tender to palpation.  Back is nontender.  Abdomen is nontender.  No murmur appreciated.  Patient has edema in the legs bilaterally.  Symmetric radial pulses.  Patient has tachypnea but is maintaining her oxygen saturations on room air.  Clinically I am concerned that patient has either asthma exacerbation, pneumonia, or pulmonary embolism.  Patient will have a d-dimer to further evaluate.  She will have serial troponins.  She will be given a DuoNeb and Solu-Medrol for likely asthma exacerbation.  Anticipate reassessment after work-up.  On reassessment, oxygen saturations dipped into the 80s.  Patient is still tachypneic and extremely short of breath.  Patient will be given magnesium as well as a second DuoNeb.    Initial troponin was negative.  BNP not elevated and d-dimer was negative.  Suspect asthma exacerbation is the primary etiology of her shortness of breath and wheezing.  CBC was reassuring and CMP showed elevated glucose but otherwise similar to prior.  Chest x-ray shows no pneumonia or consolidation or edema.    Given patient's hypoxia and is now on 4 L nasal cannula, her continued need for breathing treatments and her continued shortness of breath, patient will be admitted for further management.  Internal medicine teaching service will be called for admission.   Final Clinical Impressions(s) / ED Diagnoses   Final diagnoses:  Hypoxia  Exacerbation of asthma, unspecified asthma severity, unspecified whether persistent    ED Discharge Orders    None       Clinical Impression: 1. Hypoxia   2. Exacerbation of asthma, unspecified asthma severity, unspecified whether persistent     Disposition: Admit  This note was prepared with assistance of Dragon voice recognition software. Occasional wrong-word or sound-a-like substitutions may have occurred due to the inherent limitations of voice recognition software.     Samarra Ridgely, Gwenyth Allegra, MD 01/11/18 1650

## 2018-01-11 NOTE — ED Notes (Signed)
Pt intermittently dropping down to 89% on RA. Pt placed on 2-4 L [place]. EDP aware.

## 2018-01-11 NOTE — ED Notes (Signed)
Carb Modified diet dinner tray ordered. S/w Felecia in [facility].

## 2018-01-11 NOTE — H&P (Signed)
Date: 01/11/2018               Patient Name:  Ana Long MRN: [id number]  DOB: 01/16/1976 Age / Sex: 42 y.o., female   PCP: Ina Homes, MD         Medical Service: Internal Medicine Teaching Service         Attending Physician: Dr. Aldine Contes, MD    First Contact: Dr. Nita Sickle MD Pager: [phone]  Second Contact: Dr. Ina Homes MD Pager: [phone]       After Hours (After 5p/  First Contact Pager: [phone]  weekends / holidays): Second Contact Pager: [phone]   Chief Complaint: SOB  History of Present Illness: Ana Long is a 42 y.o female with poorly controlled asthma, morbid obesity, OSA/OHS, and uncontrolled DM who presented to the ED with SOB and left-sided chest pain. History obtained from the patient and via chart review.   Patient went to [facility] on Friday and was doing well. On Sunday she began to feel as though she was starting to have an asthma exacerbation but was unable to get on the train back to [facility] until Monday. On the train ride she began to feel more SOB and started having coughing spells. She  Would cough to the point that she would start choking and feel that she was not able to breath. She tried a breathing treatment and attempted to get some sleep but then woke up gasping for air. She took another breathing treatment with minimal relief. She describes her cough as productive of white/yellow sputum. In addition to the cough she has been experiencing left sided chest pain that started yesterday. The pain does not radiate and is exacerbated by coughing. She describes the pain as "pains in my heart."   Prior to this exacerbation, her most recent exacerbation was at the end of October. She was treated acutely with Prednisone. This was complicated by hyperglycemia. In the past 1 years she has had 5 acute exacerbation (including this one). Her last asthma exacerbation that resulted in hospitalization was in 2016. She has never  been intubated due to her asthma. She experiences coughing nightly and uses her rescue inhaler daily.   Denies fevers, chills, diaphoresis, sick contact. Endorses orthopnea (5 pillows), ?PND, urinary urgency, constipation, hyperglycemia, vaginal itching, LE swelling.  Meds:  Current Facility-Administered Medications on File Prior to Encounter  Medication Dose Route Frequency Provider Last Rate Last Dose  . diphenhydrAMINE (BENADRYL) injection 25 mg  25 mg Intravenous Once Bernarda Caffey, MD       Current Outpatient Medications on File Prior to Encounter  Medication Sig Dispense Refill  . acyclovir (ZOVIRAX) 400 MG tablet Take 1 tablet (400 mg total) by mouth 2 (two) times daily. 60 tablet 2  . albuterol (PROAIR HFA) 108 (90 Base) MCG/ACT inhaler Inhale 2 puffs into the lungs every 4 (four) hours as needed for wheezing or shortness of breath (cough). 6.7 g 2  . atorvastatin (LIPITOR) 10 MG tablet Take 1 tablet (10 mg total) by mouth daily. 90 tablet 1  . diclofenac (VOLTAREN) 75 MG EC tablet Take 1 tablet (75 mg total) by mouth 2 (two) times daily. 60 tablet 1  . diclofenac sodium (VOLTAREN) 1 % GEL Apply 2 g topically 4 (four) times daily as needed. 100 g 0  . DULERA 200-5 MCG/ACT AERO INHALE 2 PUFFS INTO THE LUNGS 2 (TWO) TIMES DAILY. RINSE MOUTH AFTER EACH USE (Patient taking differently: Inhale 2 puffs into  the lungs 2 (two) times daily. ) 13 Inhaler 1  . glipiZIDE (GLUCOTROL) 10 MG tablet Take 1 tablet (10 mg total) by mouth 2 (two) times daily before a meal. 60 tablet 3  . glucose blood (ACCU-CHEK SMARTVIEW) test strip TEST THREE TIMES DAILY 150 each 3  . ibuprofen (ADVIL,MOTRIN) 600 MG tablet Take 1 tablet (600 mg total) by mouth every 8 (eight) hours as needed. 90 tablet 1  . Insulin Glargine (LANTUS SOLOSTAR) 100 UNIT/ML Solostar Pen Inject 73 Units into the skin every morning. 15 mL 6  . Insulin Pen Needle (B-D UF III MINI PEN NEEDLES) 31G X 5 MM MISC USE AS DIRECTED TO INJECT INSULIN  TWICE A DAY E11.65 100 each 3  . lactulose (CEPHULAC) 10 g packet Take 1 packet (10 g total) by mouth daily as needed. Reported on 07/29/2015 30 each 0  . Lancets (ACCU-CHEK SOFT TOUCH) lancets Use as instructed 100 each 12  . loratadine (CLARITIN) 10 MG tablet Take 1 tablet (10 mg total) by mouth 2 (two) times daily. 60 tablet 1  . meloxicam (MOBIC) 15 MG tablet Take 1 tablet (15 mg total) by mouth daily. 30 tablet 2  . methocarbamol (ROBAXIN) 500 MG tablet Take 1 tablet (500 mg total) by mouth every 8 (eight) hours as needed. 60 tablet 1  . nitroGLYCERIN (NITRODUR - DOSED IN MG/24 HR) 0.2 mg/hr patch Apply 1/4th patch to affected achilles, change daily 30 patch 1  . omeprazole (PRILOSEC) 20 MG capsule Take 1 capsule (20 mg total) by mouth daily before breakfast. 90 capsule 2  . predniSONE (DELTASONE) 10 MG tablet Take 4 tablets (40 mg total) by mouth daily with breakfast. 20 tablet 0  . Probiotic Product (PROBIOTIC DAILY PO) Take 2 capsules by mouth every evening.     . sitaGLIPtin (JANUVIA) 100 MG tablet Take 1 tablet (100 mg total) by mouth daily. 30 tablet 3  . TURMERIC PO Take 2 capsules by mouth at bedtime.      Allergies: Allergies as of 01/11/2018 - Review Complete 01/11/2018  Allergen Reaction Noted  . Bee venom Anaphylaxis and Swelling 01/11/2018  . Wasp venom Anaphylaxis and Swelling 01/11/2018  . Citrus Hives and Itching 07/20/2012  . Latex Hives and Swelling 07/20/2012  . Penicillins Hives and Itching 01/01/2006  . Soap Hives, Itching, and Swelling 07/20/2012  . Tomato Hives and Itching 07/20/2012  . Banana Itching 01/11/2018  . Chocolate Itching 01/11/2018  . Fluticasone Other (See Comments) 12/02/2014  . Hydrocodone Itching and Nausea Only 02/12/2015  . Other Itching 12/02/2014  . Shellfish allergy Other (See Comments) 12/02/2014  . Adhesive [tape] Itching and Rash 01/11/2018  . Doxycycline Other (See Comments) 01/01/2006  . Metformin and related Nausea Only, Rash, and  Other (See Comments) 12/13/2010  . Red dye Itching, Rash, and Other (See Comments) 01/11/2018   Past Medical History:  Diagnosis Date  . Arthritis    major joints  . Asthma    prn inhaler  . Carpal tunnel syndrome of right wrist 09/2012  . Complication of anesthesia    states is hard to wake up post-op  . Diabetes mellitus    intolerant to Metformin  . Dry skin    hands   . Family history of anesthesia complication    mother went into coma during c-section  . GERD (gastroesophageal reflux disease)   . Herpes genital   . Morbid obesity with BMI of 50 to 60 07/23/2009  . Obesity hypoventilation syndrome ([place]) 10/30/2012  .  Obesity, morbid ([place]) 01/10/2014  . OSA on CPAP     AHI was on  11-17-12 to 120/hr, titrated to 15 cm with 3 cm EPR/   . PCOS (polycystic ovarian syndrome)   . Rheumatoid arthritis ([place])   . Seasonal allergies   . Sleep apnea, obstructive    uses CPAP nightly - had sleep study 08/22/2007   Family History  Problem Relation Age of Onset  . Other Mother   . Leukemia Cousin   . Other Other   . Obesity Other   . Other Other   . Diabetes Maternal Aunt   . Heart attack Maternal Aunt   . Alzheimer's disease Maternal Uncle   . Cancer Maternal Uncle        prostate  . Diabetes Paternal Aunt   . Cancer Paternal Aunt        brain,mouth  . Alzheimer's disease Paternal Aunt   . Diabetes Paternal Uncle   . Diabetes Maternal Grandmother   . Diabetes Maternal Grandfather    Social History: CNA  Denies the use of tobacco  Endorses occasional EtOH use  Denies use of illicit substances   Review of Systems: A complete ROS was negative except as per HPI.   Physical Exam: Blood pressure 134/67, pulse 73, resp. rate 20, height 5' 2"  (1.575 m), SpO2 97 %.  General: Morbidly obese female in no acute distress HENT: Normocephalic, atraumatic, moist mucus membranes, left cerumen impaction noted, right clear external ear canal with clear TM and good light reflex Pulm:  Good air movement with no wheezing or crackles  CV: RRR, no murmurs, no rubs  Abdomen: Active bowel sounds, soft, non-distended, no tenderness to palpation  Extremities: Pulses palpable in all extremities, no LE edema  Skin: Warm and dry  Neuro: Alert and oriented x 3  EKG: personally reviewed: my interpretation is sinus rhythm with a rate of ~96, normal axis, normal P wave morphology, PR interval <200 msec, normal QRS morphology, QRS interval <120 msec, normal ST segment, no QTc prolongation.   CXR: personally reviewed: my interpretation is good rotation and inspiration but poor penetration. No bony of soft tissue abnormalities noted. Visible right and left hemidiaphragm. Cardiac silhouette normal without cardiomegaly. No pleural effusions. Appear to be increased pulmonary vascular congestion with cephalization.   Assessment & Plan by Problem: Active Problems:   Asthma in adult, severe persistent, with acute exacerbation  Kleo Mctigue-Ward is a 42 y.o female with moderate-severe persistent asthma who presented to the ED with signs and symptoms of an acute asthma exacerbation. She was noted to be hypoxic on 4L/min [place] and subsequently admitted for further evaluation and management.   Acute Asthma Exacerbation  Moderate-Severe Persistent Asthma  - Patient is satting well on room air without wheezing on PE; however, is s/p duonebs, solumedrol, and Mg - Admission with initiation of DuoNebs every 6 hours while awake and prednisone 50 mg QD. - Continue mometasone-formoterol (ICS/LABA) 400-10 mcg BID, and loratadine  - CBC with diff illustrating < 100 eosinophils - We will initiate Montelukast 10 mg to see if we can get better control of her asthma  - Will get echocardiogram to assess for concomitant HF given her LE edema, orthopnea, ?PND, and vascular congestion on CXR. Although her BNP is <100, this can be falsely low in obese patients.   Uncontrolled Type 2 DM - Last Hgb A1c 12.0  -  Outpatient regimen includes Lantus 73 units QD, Sitagliptin 100 mg QD, Glipizide 10 mg QD.  -  Will start Lantus 30 units BID + SSI resistant + Night SSI coverage   OSA  - CPAP QHS  GERD - Continue protonix   Diet: Carb modified  VTE ppx: Lovenox  CODE STATUS: Full Code  Dispo: Admit patient to Inpatient with expected length of stay greater than 2 midnights.  Signed: Ina Homes, MD 01/11/2018, 5:48 PM

## 2018-01-11 NOTE — Telephone Encounter (Signed)
Ana Long paged the resident on call pager and I returned her call.  She reports left-sided chest pain which began 2 days ago.  She describes as a dull constant pain which radiates under her breast.  She also reports severe coughing which is exacerbating her chest pain.  She does state that she had any asthma exacerbation and was treated with prednisone recently but does not believe that the medication resolved her symptoms completely.  She states that she used her Dulera this morning but has not used her rescue inhaler.  I instructed to to use her albuterol rescue inhaler and call EMS immediately so that she may be evaluated in the ED.

## 2018-01-11 NOTE — ED Triage Notes (Signed)
Pt endorses cough and chest pain x1 week. Pt reports was on prednisone a week ago. Pt reports pain is 6/10. Pt coughing frequently during triage.

## 2018-01-11 NOTE — ED Notes (Signed)
Pt given bedside commode and instructed to provide UA when possible; Pt verbalized understanding.

## 2018-01-11 NOTE — ED Notes (Signed)
Pt transported to xray 

## 2018-01-12 ENCOUNTER — Inpatient Hospital Stay (HOSPITAL_COMMUNITY): Payer: Medicaid Other

## 2018-01-12 ENCOUNTER — Encounter (INDEPENDENT_AMBULATORY_CARE_PROVIDER_SITE_OTHER): Payer: Medicaid Other | Admitting: Ophthalmology

## 2018-01-12 DIAGNOSIS — R0602 Shortness of breath: Secondary | ICD-10-CM

## 2018-01-12 LAB — CBC
HCT: 38.7 % (ref 36.0–46.0)
Hemoglobin: 11.8 g/dL — ABNORMAL LOW (ref 12.0–15.0)
MCH: 25.3 pg — ABNORMAL LOW (ref 26.0–34.0)
MCHC: 30.5 g/dL (ref 30.0–36.0)
MCV: 82.9 fL (ref 80.0–100.0)
NRBC: 0 % (ref 0.0–0.2)
PLATELETS: 300 10*3/uL (ref 150–400)
RBC: 4.67 MIL/uL (ref 3.87–5.11)
RDW: 15.3 % (ref 11.5–15.5)
WBC: 8.5 10*3/uL (ref 4.0–10.5)

## 2018-01-12 LAB — ECHOCARDIOGRAM COMPLETE
HEIGHTINCHES: 62 in
WEIGHTICAEL: 4992 [oz_av]

## 2018-01-12 LAB — BASIC METABOLIC PANEL
ANION GAP: 11 (ref 5–15)
BUN: 11 mg/dL (ref 6–20)
CO2: 24 mmol/L (ref 22–32)
Calcium: 9 mg/dL (ref 8.9–10.3)
Chloride: 97 mmol/L — ABNORMAL LOW (ref 98–111)
Creatinine, Ser: 0.78 mg/dL (ref 0.44–1.00)
GFR calc non Af Amer: >60 mL/min (ref >60)
Glucose, Bld: 467 mg/dL — ABNORMAL HIGH (ref 70–99)
Potassium: 4.3 mmol/L (ref 3.5–5.1)
SODIUM: 132 mmol/L — AB (ref 135–145)

## 2018-01-12 LAB — GLUCOSE, CAPILLARY
GLUCOSE-CAPILLARY: 392 mg/dL — AB (ref 70–99)
GLUCOSE-CAPILLARY: 410 mg/dL — AB (ref 70–99)
GLUCOSE-CAPILLARY: 451 mg/dL — AB (ref 70–99)
Glucose-Capillary: 401 mg/dL — ABNORMAL HIGH (ref 70–99)
Glucose-Capillary: 421 mg/dL — ABNORMAL HIGH (ref 70–99)
Glucose-Capillary: 403 mg/dL — ABNORMAL HIGH (ref 70–99)
Glucose-Capillary: 399 mg/dL — ABNORMAL HIGH (ref 70–99)
Glucose-Capillary: 411 mg/dL — ABNORMAL HIGH (ref 70–99)

## 2018-01-12 LAB — URINE CULTURE

## 2018-01-12 MED ORDER — INSULIN GLARGINE 100 UNIT/ML ~~LOC~~ SOLN
40.0000 [IU] | Freq: Two times a day (BID) | SUBCUTANEOUS | Status: DC
  Administered 2018-01-12 – 2018-01-13 (×3): 40 [IU] via SUBCUTANEOUS
  Filled 2018-01-12 (×4): qty 0.4

## 2018-01-12 MED ORDER — PERFLUTREN LIPID MICROSPHERE
INTRAVENOUS | Status: AC
  Administered 2018-01-12: 4 mL
  Filled 2018-01-12: qty 10

## 2018-01-12 MED ORDER — FLUCONAZOLE 150 MG PO TABS
150.0000 mg | ORAL_TABLET | Freq: Once | ORAL | Status: AC
  Administered 2018-01-12: 150 mg via ORAL
  Filled 2018-01-12: qty 1

## 2018-01-12 MED ORDER — ENOXAPARIN SODIUM 80 MG/0.8ML ~~LOC~~ SOLN
0.5000 mg/kg | SUBCUTANEOUS | Status: DC
  Administered 2018-01-12: 70 mg via SUBCUTANEOUS
  Filled 2018-01-12 (×3): qty 0.8

## 2018-01-12 MED ORDER — INSULIN ASPART 100 UNIT/ML ~~LOC~~ SOLN
20.0000 [IU] | Freq: Once | SUBCUTANEOUS | Status: AC
  Administered 2018-01-12: 20 [IU] via SUBCUTANEOUS

## 2018-01-12 MED ORDER — INSULIN ASPART 100 UNIT/ML ~~LOC~~ SOLN
12.0000 [IU] | Freq: Three times a day (TID) | SUBCUTANEOUS | Status: DC
  Administered 2018-01-12 – 2018-01-13 (×4): 12 [IU] via SUBCUTANEOUS

## 2018-01-12 MED ORDER — PERFLUTREN LIPID MICROSPHERE
4.0000 mL | Freq: Once | INTRAVENOUS | Status: AC
  Administered 2018-01-12: 4 mL via INTRAVENOUS

## 2018-01-12 NOTE — Progress Notes (Signed)
   Subjective: [name] reports continued shortness of breath and coughing.  She does state that she took [employer] which did help with her coughing.  She is feeling about the same as yesterday.  She reports that her left-sided chest pain has improved but is still present.  Objective:  Vital signs in last 24 hours: Vitals:   01/12/18 0000 01/12/18 0125 01/12/18 0130 01/12/18 0354  BP: 140/86   (!) 149/80  Pulse: 99  100 85  Resp: 18  20 18   Temp: 97.8 F (36.6 C)   98 F (36.7 C)  TempSrc: Oral   Axillary  SpO2: 96% 97% 100% 98%  Weight:      Height:       Physical Exam  Constitutional:  Obese female lying in bed with CPAP mask.  In no acute distress.  HENT:  Head: Normocephalic and atraumatic.  Eyes: EOM are normal.  Neck: Normal range of motion.  Cardiovascular: Normal rate, regular rhythm and normal pulses.  Pulmonary/Chest:  Lungs auscultated anteriorly with normal breath sounds heard.  Abdominal: Soft. Bowel sounds are normal. She exhibits no distension. There is no tenderness.  Musculoskeletal:  No lower extremity edema noted bilaterally.  Neurological: She is alert.  Skin: Skin is warm and dry.  Psychiatric: She has a normal mood and affect. Her behavior is normal.  Nursing note and vitals reviewed.   Assessment/Plan:  Active Problems:   Uncontrolled type 2 diabetes mellitus with hyperglycemia (HCC)   Asthma in adult, severe persistent, with acute exacerbation  Shemika Cornacchia-Ward has moderate-severe persistent asthma who was admitted with signs and symptoms of an acute asthma exacerbation.  She is still requiring supplemental oxygen but is currently on 2-1/2 L versus 4 L yesterday.  We will continue DuoNeb, prednisone, Dulera, Loratadine, and Montelukast treatment today.  Acute Asthma Exacerbation--Moderate-Severe Persistent Asthma  1. Continue duo nebs every 6 hours 2. Continue prednisone 50 mg daily 3. Continue mometasone-formoterol (ICS/LABA)  400-10 mcg BID 4. Continue loratadine 10 mg QD 5. Continue Montelukast 10 mg QD 6. Follow-up ECHO to rule out cardiac structural abnormalities.  Uncontrolled Type 2 DM: Outpatient insulin regimen includes Lantus 73 units daily, sitagliptin 100 mg daily, glipizide 10 mg daily.  Started her on Lantus 30 units twice daily plus sliding scale resistant.  Her blood sugar continues to remain elevated with blood sugars between 400-600 and is requiring a significant amount of NovoLog.  Her hyperglycemia is likely being driven by steroid use.  We will increase her Lantus to 40 units BID today and add 12 units Lantus with meals.  We will continue sliding scale insulin--resistant. 1.  Increase Lantus to 40 units twice daily 2.  Add 2012 units Lantus with meals. 3.  Continue SSI resistant plus night SSI coverage  OSA: Stable 1.  Continue CPAP QHS  GERD: Stable 1.  Continue protonix    Dispo: Anticipated discharge in approximately 1 to 2 days.  Carroll Sage, MD 01/12/2018, 7:12 AM Pager: [phone]

## 2018-01-12 NOTE — Progress Notes (Addendum)
Inpatient Diabetes Program Recommendations  AACE/ADA: New Consensus Statement on Inpatient Glycemic Control (2015)  Target Ranges:  Prepandial:   less than 140 mg/dL      Peak postprandial:   less than 180 mg/dL (1-2 hours)      Critically ill patients:  140 - 180 mg/dL   Lab Results  Component Value Date   GLUCAP 392 (H) 01/12/2018   HGBA1C 11.5 (A) 10/13/2017    Review of Glycemic ControlResults for MAISEY, DEANDRADE (MRN [id number]) as of 01/12/2018 10:29  Ref. Range 01/11/2018 22:18 01/11/2018 22:22 01/12/2018 00:12 01/12/2018 01:43 01/12/2018 07:22  Glucose-Capillary Latest Ref Range: 70 - 99 mg/dL 484 (H) 590 (HH) 451 (H) 401 (H) 392 (H)    Diabetes history: DM 2 Outpatient Diabetes medications:  Lantus 50 units in the AM and 23 units in the PM, Glucotrol 10 mg bid, Januvia 100 mg daily Current orders for Inpatient glycemic control:  Novolog resistant tid with meals and HS, Lantus 40 units bid Prednisone 50 mg with breakfast Inpatient Diabetes Program Recommendations:     Note blood sugars markedly increased with steroids.  Last A1C in August was 11.5%.   While in the hospital, consider increasing Novolog correction to q 4 hours.  Also please add Novolog meal coverage 10 units tid with meals (hold if patient eats less than 50%).  Thanks,  Adah Perl, RN, BC-ADM Inpatient Diabetes Coordinator Pager [phone] (8a-5p)   1440- Briefly spoke with patient regarding DM.  She states that blood sugars have been increasingly high with steroids. She has been in contact with the Internal medicine clinic when blood sugars increased early in the month.  Her Lantus dose was increased to 44 units bid at MD appointment on 12/26/17.  I discussed the difference between Lantus and Novolog.  Patient likely needs Novolog when on steroids at home.  We discussed the difference between basal and bolus insulin including correction vs. Meal coverage.  She likely needs Rx. For Novolog  correction so that patient can treat elevated blood sugars at home.  Patient states that she is fearful of Novolog "because my blood sugars drop quickly".  Reinforced that high blood sugars are bad for the body and healing.  Also discussed the role of steroids with hyperglycemia.  Discussed with RN as well.

## 2018-01-12 NOTE — Progress Notes (Signed)
RN paged MD for CBG of 410, and MD ordered to give 32 units as ordered in [place]

## 2018-01-12 NOTE — Progress Notes (Addendum)
Dr. Laural Golden paged the following through [place] at 2225:  "2W13:Roye.Current CBG 411. Sliding scale says 5units novalog & page MD.pt.to receive 1st increased dose of 40U lantus.Any additional nova?"  2030: No new orders per Dr. Laural Golden.

## 2018-01-12 NOTE — Progress Notes (Signed)
*  PRELIMINARY RESULTS* Echocardiogram 2D Echocardiogram has been performed.  Ana Long 01/12/2018, 3:54 PM

## 2018-01-13 DIAGNOSIS — R079 Chest pain, unspecified: Secondary | ICD-10-CM

## 2018-01-13 DIAGNOSIS — R1013 Epigastric pain: Secondary | ICD-10-CM

## 2018-01-13 LAB — BASIC METABOLIC PANEL
Anion gap: 10 (ref 5–15)
BUN: 15 mg/dL (ref 6–20)
CALCIUM: 8.8 mg/dL — AB (ref 8.9–10.3)
CO2: 23 mmol/L (ref 22–32)
Chloride: 97 mmol/L — ABNORMAL LOW (ref 98–111)
Creatinine, Ser: 0.9 mg/dL (ref 0.44–1.00)
GFR calc Af Amer: >60 mL/min (ref >60)
GLUCOSE: 384 mg/dL — AB (ref 70–99)
Potassium: 3.9 mmol/L (ref 3.5–5.1)
Sodium: 130 mmol/L — ABNORMAL LOW (ref 135–145)

## 2018-01-13 LAB — GLUCOSE, CAPILLARY
GLUCOSE-CAPILLARY: 374 mg/dL — AB (ref 70–99)
GLUCOSE-CAPILLARY: 419 mg/dL — AB (ref 70–99)
Glucose-Capillary: 364 mg/dL — ABNORMAL HIGH (ref 70–99)
Glucose-Capillary: 285 mg/dL — ABNORMAL HIGH (ref 70–99)
Glucose-Capillary: 366 mg/dL — ABNORMAL HIGH (ref 70–99)

## 2018-01-13 MED ORDER — DICLOFENAC SODIUM 1 % TD GEL
2.0000 g | Freq: Three times a day (TID) | TRANSDERMAL | Status: DC | PRN
  Filled 2018-01-13: qty 100

## 2018-01-13 MED ORDER — SENNOSIDES-DOCUSATE SODIUM 8.6-50 MG PO TABS
1.0000 | ORAL_TABLET | Freq: Two times a day (BID) | ORAL | Status: DC
  Administered 2018-01-14 – 2018-01-15 (×2): 1 via ORAL
  Filled 2018-01-13 (×4): qty 1

## 2018-01-13 MED ORDER — LACTULOSE 10 GM/15ML PO SOLN: 10.0000 g | Freq: Every day | ORAL | Status: DC

## 2018-01-13 MED ORDER — PREDNISONE 20 MG PO TABS
40.0000 mg | ORAL_TABLET | Freq: Every day | ORAL | Status: DC
  Administered 2018-01-14 – 2018-01-15 (×2): 40 mg via ORAL
  Filled 2018-01-13 (×3): qty 2

## 2018-01-13 MED ORDER — POLYETHYLENE GLYCOL 3350 17 G PO PACK
17.0000 g | PACK | Freq: Every day | ORAL | Status: DC | PRN
  Administered 2018-01-14 – 2018-01-15 (×2): 17 g via ORAL
  Filled 2018-01-13 (×2): qty 1

## 2018-01-13 NOTE — Progress Notes (Signed)
   Subjective: [name] states that her shortness of breath has worsened since yesterday.  She reports that her breathing is significantly worsened whenever she coughs.  She is also having epigastric/lower chest pain which is worsened with coughing.   Objective:  Vital signs in last 24 hours: Vitals:   01/13/18 0815 01/13/18 0819 01/13/18 0824 01/13/18 1150  BP: (!) 123/107   135/81  Pulse: 80 81  73  Resp: 16 16  16   Temp: 97.9 F (36.6 C)   98.5 F (36.9 C)  TempSrc: Oral   Oral  SpO2: 96% 98% 98% 93%  Weight:      Height:       Physical Exam  Constitutional: She is oriented to person, place, and time. She appears well-developed and well-nourished.  HENT:  Head: Normocephalic and atraumatic.  Eyes: EOM are normal.  Neck: Normal range of motion.  Cardiovascular: Normal rate and regular rhythm.  Pulmonary/Chest: Effort normal and breath sounds normal.  Clear to auscultation bilaterally.  Abdominal: Soft. Bowel sounds are normal. She exhibits no distension. There is no tenderness.  Musculoskeletal: Normal range of motion.       Right lower leg: Normal.       Left lower leg: Normal.  Neurological: She is alert and oriented to person, place, and time.  Skin: Skin is warm and dry.  Psychiatric: She has a normal mood and affect. Her behavior is normal.  Nursing note and vitals reviewed.   Assessment/Plan:  Active Problems:   Uncontrolled type 2 diabetes mellitus with hyperglycemia (HCC)   Asthma in adult, severe persistent, with acute exacerbation  Arleatha Scherr-Ward has moderate-severe persistent asthma who was admitted for an acute asthma exacerbation.  She required 2 to 4 L supplemental oxygen throughout the last 24 hours.  Per nursing report she is saturating well on room air at rest.  She does however have a "dip into the 70s" when she begins to cough.  This morning her lungs are very clear and she has good air movement.  Oxygen titrated down and she is now  saturating well on room air (90-94%).  We will get an ambulatory pulse ox this afternoon.  We will also again titrating her prednisone today (50->40 mg QD).  We will continue duo nebs, Dulera, loratadine, and montelukast treatments today.  Acute Asthma Exacerbation--Moderate-Severe Persistent Asthma  1. Continue duo nebs every 6 hours 2.  Decrease prednisone to 40 mg daily 3. Continue mometasone-formoterol (ICS/LABA) 400-10 mcg BID 4. Continue loratadine 10 mg QD 5. Continue Montelukast 10 mg QD  Uncontrolled Type 2 DM: Outpatient insulin regimen includes Lantus 73 units daily, sitagliptin 100 mg daily, glipizide 10 mg daily.    Her current regimen includes Lantus to 40 units BID and 12 units Lantus with meals.    Her blood sugars have improved to 280s-380s.  I foresee her blood sugars continue to improve as we began titrating her prednisone.  We will hold off on increasing her insulin regimen. 1.  Continue Lantus to 40 units twice daily 2.  Continue 12 units NovoLog with meals. 3.  Continue SSI resistant plus night SSI coverage  OSA: Stable 1.  Continue CPAP QHS  GERD: Stable 1.  Continue protonix   Constipation: Per nursing she had a bowel movement yesterday but still complains of constipation. 1.  Continue MiraLAX daily and Senokot BID  Dispo: Anticipated discharge in approximately 1-2 days.  Carroll Sage, MD 01/13/2018, 12:42 PM Pager: [phone]

## 2018-01-13 NOTE — Progress Notes (Signed)
Pt ambulated on RA with O2 sats between 94-97%. Pt is still having increased SOB with ambulating, but is tolerating.  Riley Kill RN

## 2018-01-13 NOTE — Progress Notes (Signed)
Inpatient Diabetes Program Recommendations  AACE/ADA: New Consensus Statement on Inpatient Glycemic Control (2015)  Target Ranges:  Prepandial:   less than 140 mg/dL      Peak postprandial:   less than 180 mg/dL (1-2 hours)      Critically ill patients:  140 - 180 mg/dL   Lab Results  Component Value Date   GLUCAP 285 (H) 01/13/2018   HGBA1C 11.5 (A) 10/13/2017    Review of Glycemic Control Results for Ana Long, Ana Long (MRN [id number]) as of 01/13/2018 11:00  Ref. Range 01/12/2018 16:58 01/12/2018 18:27 01/12/2018 21:28 01/13/2018 00:39 01/13/2018 08:18  Glucose-Capillary Latest Ref Range: 70 - 99 mg/dL 410 (H) 399 (H) 411 (H) 364 (H) 285 (H)  Diabetes history: DM 2 Outpatient Diabetes medications:  Lantus 50 units in the AM and 23 units in the PM, Glucotrol 10 mg bid, Januvia 100 mg daily Current orders for Inpatient glycemic control:  Novolog resistant tid with meals and HS, Lantus 40 units bid Prednisone 50 mg with breakfast, Novolog 12 units tid with meals I Inpatient Diabetes Program Recommendations:    Blood sugars improved this morning.  Patient will likely need Novolog meal coverage and correction at d/c to prevent hyperglycemia.   Thanks,  Adah Perl, RN, BC-ADM Inpatient Diabetes Coordinator Pager [phone] (8a-5p)

## 2018-01-13 NOTE — Progress Notes (Signed)
Breathing treatment given and patient says she wants to eat her salad and drink her tea before going on her CPAP. She said she would get nurse to call when ready.

## 2018-01-14 DIAGNOSIS — G4733 Obstructive sleep apnea (adult) (pediatric): Secondary | ICD-10-CM

## 2018-01-14 DIAGNOSIS — E118 Type 2 diabetes mellitus with unspecified complications: Secondary | ICD-10-CM

## 2018-01-14 LAB — GLUCOSE, CAPILLARY
GLUCOSE-CAPILLARY: 236 mg/dL — AB (ref 70–99)
GLUCOSE-CAPILLARY: 332 mg/dL — AB (ref 70–99)
Glucose-Capillary: 252 mg/dL — ABNORMAL HIGH (ref 70–99)
Glucose-Capillary: 370 mg/dL — ABNORMAL HIGH (ref 70–99)

## 2018-01-14 MED ORDER — MUPIROCIN 2 % EX OINT
1.0000 "application " | TOPICAL_OINTMENT | Freq: Two times a day (BID) | CUTANEOUS | Status: DC
  Administered 2018-01-14 – 2018-01-15 (×2): 1 via NASAL
  Filled 2018-01-14: qty 22

## 2018-01-14 MED ORDER — CHLORHEXIDINE GLUCONATE CLOTH 2 % EX PADS
6.0000 | MEDICATED_PAD | Freq: Every day | CUTANEOUS | Status: DC
  Administered 2018-01-15: 6 via TOPICAL

## 2018-01-14 MED ORDER — INSULIN ASPART 100 UNIT/ML ~~LOC~~ SOLN: 15.0000 [IU] | Freq: Three times a day (TID) | SUBCUTANEOUS | Status: DC

## 2018-01-14 MED ORDER — INSULIN GLARGINE 100 UNIT/ML ~~LOC~~ SOLN
45.0000 [IU] | Freq: Two times a day (BID) | SUBCUTANEOUS | Status: DC
  Administered 2018-01-14 – 2018-01-15 (×3): 45 [IU] via SUBCUTANEOUS
  Filled 2018-01-14 (×4): qty 0.45

## 2018-01-14 MED ORDER — INSULIN ASPART 100 UNIT/ML ~~LOC~~ SOLN
15.0000 [IU] | Freq: Three times a day (TID) | SUBCUTANEOUS | Status: DC
  Administered 2018-01-14 – 2018-01-15 (×6): 15 [IU] via SUBCUTANEOUS

## 2018-01-14 NOTE — Progress Notes (Signed)
   Subjective: Patient doing well this AM. She was able to be off oxygen all day yesterday. Ambulated without desaturation. SOB has significantly improved but continues to have a persistent cough. Discussed the plan for discharge home today with prednisone taper and initiation of meal time insulin. She is reluctant to meal time insulin but understands she needs it. All questions and concerns addressed.   Objective: Vital signs in last 24 hours: Vitals:   01/13/18 2332 01/14/18 0132 01/14/18 0146 01/14/18 0733  BP: 118/78   119/73  Pulse: 90  95 80  Resp: 20  19 18   Temp: 98 F (36.7 C)   97.8 F (36.6 C)  TempSrc: Oral   Oral  SpO2: 94% 98% 99% 100%  Weight:      Height:       General: Morbidly obese female in no acute distress Pulm: Good air movement with no wheezing or crackles  CV: RRR, no murmurs, no rubs  Abdomen: Soft, non-distended, no tenderness to palpation  Extremities: No LE edema   Assessment/Plan:  Ambree Rexroad-Wardhasmoderate-severe persistent asthma whowas admitted for an acute asthma exacerbation. She was initially hypoxic on 4L/min; however, with subsequent treatment has been able to maintain her oxygen saturation >92% on RA. We have initiated a prednisone taper and are continuing all other asthma medications as outlined below. The patient is stable for discharge today.   Acute Asthma Exacerbation--Moderate-Severe Persistent Asthma 1. Continue duo nebs every 6 hours 2.  Prednisone 40 mg QD (Start 14 day taper) 3. Continuemometasone-formoterol (ICS/LABA) 400-10 mcg BID 4. Continue loratadine10 mg QD 5. ContinueMontelukast 10 mgQD  Uncontrolled Type 2 FF:[id number] insulin regimen includes Lantus 73 units daily, sitagliptin 100 mg daily, glipizide 10 mg daily. Her current regimen includes Lantus to 40 units BID and12 units Lantus with meals. Her CBGs remain above goal. She will need to be discharged with meal time insulin and close follow-up.  1.   Increase Lantus to 45 units twice daily 2. Increase NovoLog to 15 units TID WC 3. Continue SSI resistant plus night SSI coverage  TTS:[id number] 1. ContinueCPAP QHS  GERD:Stable 1.Continue protonix   Constipation: Per nursing she had a bowel movement yesterday but still complains of constipation. 1.  Continue MiraLAX daily and Senokot BID  Dispo: Anticipated discharge today with close outpatient follow-up.   Ina Homes, MD 01/14/2018, 7:58 AM

## 2018-01-14 NOTE — Progress Notes (Signed)
Spoke with patient after ambulating in the halls. She continues to remain dyspneic with minimal exertion. Given her tedious respiratory status I feel it is appropriate to keep her at least 1 more night in the hospital to hopefully prevent further respiratory decline.

## 2018-01-14 NOTE — Discharge Instructions (Signed)
Thank you for allowing [place] to provide your care. We have made the following changes this hospitalization:  1. Started you on Singulair for your asthma. Continuing taking the [facility] as prescribed. Avoid triggers including pet dander and smoke. Continue the prednisone taper as prescribed.   2. Started you on meal time insulin (Novlog 15 units three times daily with meals) and increased you Lantus to 40 units in the morning and before bedtime. I have also sent out a prescription for your meter. Please start checking your sugars 4 times a day (in the morning and before each meal).   3. Sent out a prescription for your CPAP supplies.   The clinic will call you to schedule a follow-up appointment. Have a great Thanksgiving.

## 2018-01-15 DIAGNOSIS — Z6841 Body Mass Index (BMI) 40.0 and over, adult: Secondary | ICD-10-CM

## 2018-01-15 LAB — GLUCOSE, CAPILLARY
Glucose-Capillary: 310 mg/dL — ABNORMAL HIGH (ref 70–99)
Glucose-Capillary: 285 mg/dL — ABNORMAL HIGH (ref 70–99)
Glucose-Capillary: 347 mg/dL — ABNORMAL HIGH (ref 70–99)

## 2018-01-15 MED ORDER — GLUCOSE BLOOD VI STRP: ORAL_STRIP | 6 refills | Status: AC

## 2018-01-15 MED ORDER — ACCU-CHEK AVIVA PLUS W/DEVICE KIT: PACK | 0 refills | Status: AC

## 2018-01-15 MED ORDER — BENZONATATE 200 MG PO CAPS: 200.0000 mg | ORAL_CAPSULE | Freq: Three times a day (TID) | ORAL | 0 refills | Status: AC | PRN

## 2018-01-15 MED ORDER — INSULIN PEN NEEDLE 31G X 5 MM MISC: 6 refills | Status: DC

## 2018-01-15 MED ORDER — PREDNISONE 10 MG PO TABS: ORAL_TABLET | ORAL | 0 refills | Status: AC

## 2018-01-15 MED ORDER — INSULIN GLARGINE 100 UNIT/ML SOLOSTAR PEN: 45.0000 [IU] | PEN_INJECTOR | Freq: Two times a day (BID) | SUBCUTANEOUS | 12 refills | Status: DC

## 2018-01-15 MED ORDER — ACCU-CHEK SOFT TOUCH LANCETS MISC: 12 refills | Status: AC

## 2018-01-15 MED ORDER — FLUCONAZOLE 150 MG PO TABS
150.0000 mg | ORAL_TABLET | Freq: Once | ORAL | Status: AC
  Administered 2018-01-15: 150 mg via ORAL
  Filled 2018-01-15: qty 1

## 2018-01-15 MED ORDER — MONTELUKAST SODIUM 10 MG PO TABS: 10.0000 mg | ORAL_TABLET | Freq: Every day | ORAL | 1 refills | Status: AC

## 2018-01-15 MED ORDER — INSULIN ASPART 100 UNIT/ML FLEXPEN: 15.0000 [IU] | PEN_INJECTOR | Freq: Three times a day (TID) | SUBCUTANEOUS | 11 refills | Status: DC

## 2018-01-15 NOTE — Discharge Summary (Signed)
Name: Ana Long MRN: [id number] DOB: 08/22/1975 42 y.o. PCP: Ina Homes, MD  Date of Admission: 01/11/2018  2:12 PM Date of Discharge: 01/15/2018 Attending Physician: Bartholomew Crews, MD  Discharge Diagnosis: 1. Moderate-Severe Persistent Asthma with Acute Exacerbation 2. Type 2 DM with Hyperglycemia  3. OSA  Discharge Medications: Allergies as of 01/15/2018      Reactions   Bee Venom Anaphylaxis, Swelling   Wasp Venom Anaphylaxis, Swelling   Citrus Hives, Itching   Latex Hives, Swelling   Penicillins Hives, Itching   Has patient had a PCN reaction causing immediate rash, facial/tongue/throat swelling, SOB or lightheadedness with hypotension: Yes Has patient had a PCN reaction causing severe rash involving mucus membranes or skin necrosis: Unk Has patient had a PCN reaction that required hospitalization: Was already in hosp Has patient had a PCN reaction occurring within the last 10 years: No If all of the above answers are "NO", then may proceed with Cephalosporin use.   Soap Hives, Itching, Swelling   PUREX LAUNDRY DETERGENT   Tomato Hives, Itching   Banana Itching, Nausea Only   Chocolate Itching   Fluticasone Other (See Comments)   Per patient, makes sneeze and gag   Hydrocodone Itching, Nausea Only   Other Itching   Acidic foods- Itching [place]- Itching in the roof of the mouth   Shellfish Allergy Other (See Comments)   Patient is uncertain; stated that she tolerates this (??)   Adhesive [tape] Itching, Rash   Allergic to  "leads"   Doxycycline Other (See Comments)   AFFECTS JOINTS   Metformin And Related Nausea Only, Rash, Other (See Comments)   GI UPSET, also   Red Dye Itching, Rash, Other (See Comments)   At eye doctor's office, the dilating drops- Skin breaks out, chest turned red, itching, face became swollen      Medication List    STOP taking these medications   diclofenac sodium 1 % Gel Commonly known as:  VOLTAREN     meloxicam 15 MG tablet Commonly known as:  MOBIC     TAKE these medications   ACCU-CHEK AVIVA PLUS w/Device Kit Please check you blood sugars 4 times a day   accu-chek soft touch lancets Use as instructed   acyclovir 400 MG tablet Commonly known as:  ZOVIRAX Take 1 tablet (400 mg total) by mouth 2 (two) times daily.   albuterol 108 (90 Base) MCG/ACT inhaler Commonly known as:  PROVENTIL HFA;VENTOLIN HFA Inhale 2 puffs into the lungs every 4 (four) hours as needed for wheezing or shortness of breath (cough). What changed:  reasons to take this   benzonatate 200 MG capsule Commonly known as:  TESSALON Take 1 capsule (200 mg total) by mouth 3 (three) times daily as needed for cough.   diclofenac 75 MG EC tablet Commonly known as:  VOLTAREN Take 1 tablet (75 mg total) by mouth 2 (two) times daily. What changed:    when to take this  reasons to take this   DULERA 200-5 MCG/ACT Aero Generic drug:  mometasone-formoterol INHALE 2 PUFFS INTO THE LUNGS 2 (TWO) TIMES DAILY. RINSE MOUTH AFTER EACH USE What changed:  See the new instructions.   glipiZIDE 10 MG tablet Commonly known as:  GLUCOTROL Take 1 tablet (10 mg total) by mouth 2 (two) times daily before a meal.   glucose blood test strip TEST FOUR TIMES DAILY What changed:  additional instructions   ibuprofen 600 MG tablet Commonly known as:  ADVIL,MOTRIN Take 1  tablet (600 mg total) by mouth every 8 (eight) hours as needed. What changed:  reasons to take this   insulin aspart 100 UNIT/ML FlexPen Commonly known as:  NOVOLOG Inject 15 Units into the skin 3 (three) times daily with meals.   Insulin Glargine 100 UNIT/ML Solostar Pen Commonly known as:  LANTUS Inject 45 Units into the skin 2 (two) times daily. What changed:    how much to take  when to take this   Insulin Pen Needle 31G X 5 MM Misc USE AS DIRECTED TO INJECT INSULIN FIVE A DAY E11.65 What changed:  additional instructions   lactulose 10 g  packet Commonly known as:  CEPHULAC Take 1 packet (10 g total) by mouth daily as needed. Reported on 07/29/2015 What changed:    reasons to take this  additional instructions   loratadine 10 MG tablet Commonly known as:  CLARITIN Take 1 tablet (10 mg total) by mouth 2 (two) times daily.   methocarbamol 500 MG tablet Commonly known as:  ROBAXIN Take 1 tablet (500 mg total) by mouth every 8 (eight) hours as needed. What changed:  reasons to take this   montelukast 10 MG tablet Commonly known as:  SINGULAIR Take 1 tablet (10 mg total) by mouth at bedtime.   nitroGLYCERIN 0.2 mg/hr patch Commonly known as:  NITRODUR - Dosed in mg/24 hr Apply 1/4th patch to affected achilles, change daily   omeprazole 20 MG capsule Commonly known as:  PRILOSEC Take 1 capsule (20 mg total) by mouth daily before breakfast.   predniSONE 10 MG tablet Commonly known as:  DELTASONE Take 2 tablets (20 mg total) by mouth daily with breakfast for 3 days, THEN 1 tablet (10 mg total) daily with breakfast for 3 days, THEN 0.5 tablets (5 mg total) daily with breakfast for 3 days. Start taking on:  01/15/2018   PROBIOTIC DAILY PO Take 2 capsules by mouth every evening.   sitaGLIPtin 100 MG tablet Commonly known as:  JANUVIA Take 1 tablet (100 mg total) by mouth daily.   TURMERIC PO Take 2 capsules by mouth at bedtime.            Durable Medical Equipment  (From admission, onward)         Start     Ordered   01/15/18 0000  For home use only DME continuous positive airway pressure (CPAP)    Question Answer Comment  Patient has OSA or probable OSA Yes   Settings Autotitration   CPAP supplies needed Mask, headgear, cushions, filters, heated tubing and water chamber      01/15/18 0909        Disposition and follow-up:   Ms.Madaline A Bonczek-Ward was discharged from [facility] in Stable condition.  At the hospital follow up visit please address:  1.  Asthma. Ensure the  patient is using her inhalers properly. Discuss whether she was able to finish the prednisone taper. T2DM. Discuss whether the patient has obtained her glucose monitor and checking her CBGs 4 times per day. Titrate the patient's insulin as needed. OSA. Discuss whether the patient was able to obtain her CPAP supplies.   2.  Labs / imaging needed at time of follow-up: None  3.  Pending labs/ test needing follow-up: None  Follow-up Appointments: Follow-up Information    Ina Homes, MD Follow up.   Specialty:  Internal Medicine Contact information: [place] [address] [phone]  Hospital Course by problem list:  1. Moderate-Severe Persistent Asthma with Acute Exacerbation. Davy Sheeley-Wardhasmoderate-severe persistent asthma whowas admittedfor anacute asthma exacerbation. She was initially hypoxic on 4L/min; however, with subsequent treatment has been able to maintain her oxygen saturation >92% on RA. Asthma medications were adjusted to treat her underlying asthma. She was continued on Dulera BID, loratadine 10 mg daily, and started on montelukast 10 mg daily. In addition to this she was started on a 14 day prednisone taper. Over the course of her hospitalization her shortness of breath significantly improved and she was able to maintain her oxygen saturation's with ambulation on room air. She was discharged with instructions to follow-up in the clinic. If she continues to have difficulty control asthma she will likely need referral to an asthma specialist.  2. Type 2 DM with Hyperglycemia. The patient has uncontrolled type II diabetes and was started on Lantus 45 units twice daily, NovoLog 15 minutes TID with meals, and nighttime coverage. Her outpatient regimen consists of sitagliptin 100 mg daily, and glipizide 10 mg daily. She did not have a glucose meter at home so she was not able to check her sugars. She was discharged with prescription to obtain a  glucose meter and on Lantus 45 units twice daily, NovoLog 15 units TID with meals, sitagliptin 100 mg daily, and glipizide 10 mg daily. She will need close follow-up and titration of her insulin especially when she has completed her steroid taper.  3. OSA. The patient has OSA but has not been able to use her CPAP home due to lack of supplies. She is not able to see her sleep doctor until February. I have sent out a prescription for her CPAP supplies please ensure that she was able to obtain these and is using her CPAP machine consistently.  Discharge Vitals:   BP 138/74 (BP Location: Right Arm)   Pulse 68   Temp (!) 97.5 F (36.4 C) (Oral)   Resp 20   Ht 5' 2"  (1.575 m)   Wt (!) 141.5 kg   SpO2 100%   BMI 57.07 kg/m   Discharge Instructions: Discharge Instructions    Call MD for:  difficulty breathing, headache or visual disturbances   Complete by:  As directed    Diet - low sodium heart healthy   Complete by:  As directed    For home use only DME continuous positive airway pressure (CPAP)   Complete by:  As directed    Patient has OSA or probable OSA:  Yes   Settings:  Autotitration   CPAP supplies needed:  Mask, headgear, cushions, filters, heated tubing and water chamber   Increase activity slowly   Complete by:  As directed     Signed: Ina Homes, MD 01/15/2018, 9:10 AM

## 2018-01-15 NOTE — Progress Notes (Signed)
RT added sterile water to CPAP at patient bedside.  Patient places self on CPAP.

## 2018-01-15 NOTE — Progress Notes (Signed)
   Subjective: Ana Long reports that she feels much better today.  She has been ambulating throughout the day yesterday without oxygen.  She continues to have intermittent shortness of breath but this is much improved.  She is ready to go home.  Objective:  Vital signs in last 24 hours: Vitals:   01/14/18 1327 01/14/18 1728 01/14/18 2121 01/15/18 0143  BP:  102/62    Pulse:  72    Resp:  17    Temp:  98.4 F (36.9 C)    TempSrc:  Oral    SpO2: 97% 97% 98% 99%  Weight:      Height:       Physical Exam  Constitutional: She is oriented to person, place, and time. She appears well-developed and well-nourished.  Lying in bed with CPAP mask on.  HENT:  Head: Normocephalic and atraumatic.  Eyes: EOM are normal.  Neck: Normal range of motion.  Cardiovascular: Normal rate, regular rhythm and normal pulses.  Pulmonary/Chest: Effort normal.  Lungs auscultated anteriorly with normal breath sounds.  Abdominal: Soft. Bowel sounds are normal. She exhibits no distension. There is no tenderness.  Musculoskeletal: Normal range of motion.       Right lower leg: She exhibits no edema.       Left lower leg: She exhibits no edema.  Neurological: She is alert and oriented to person, place, and time.  Skin: Skin is warm and dry.  Psychiatric: She has a normal mood and affect. Her behavior is normal.  Nursing note and vitals reviewed.   Assessment/Plan:  Principal Problem:   Asthma in adult, severe persistent, with acute exacerbation Active Problems:   Uncontrolled type 2 diabetes mellitus with hyperglycemia ([place])  Ana Brinson-Wardhasmoderate-severe persistent asthma whowas admittedfor anacute asthma exacerbation which has now improved significantly with prednisone and other asthma medications as outlined below.  She is currently saturating well on room air and is only having intermittent shortness of breath with exertion.  She is stable for discharge today.  We will continue  her prednisone taper for a total of 14 days.  Acute Asthma Exacerbation--Moderate-Severe Persistent Asthma: Continue the following regimen discharge 1. Prednisone 40 mg QD today with taper post discharge (total 14 day taper) 2. Mometasone-formoterol (ICS/LABA) 400-10 mcg BID at discharge 3. Montelukast 10 mgQD at discharge 4. Loratadine 10 mg twice daily at discharge  Uncontrolled Type 2 DM:Her blood sugars continue to remain elevated (300-350) but have improved significantly since increasing her insulin requirement.  She will continue the below regiment at discharge. 1.Lantus to 45 units twice daily 2. NovoLogto 15 units TID WC 3.  Sitagliptin 100 mg daily 4.  Glipizide 10 mg twice daily  [id number] 1. ContinueCPAP QHS  Dispo: Anticipated discharge today  Carroll Sage, MD 01/15/2018, 6:26 AM Pager: [phone]

## 2018-01-24 ENCOUNTER — Ambulatory Visit (INDEPENDENT_AMBULATORY_CARE_PROVIDER_SITE_OTHER): Payer: Medicaid Other | Admitting: Internal Medicine

## 2018-01-24 ENCOUNTER — Other Ambulatory Visit: Payer: Self-pay

## 2018-01-24 ENCOUNTER — Encounter: Payer: Self-pay | Admitting: Internal Medicine

## 2018-01-24 VITALS — BP 112/53 | HR 91 | Temp 98.0°F | Wt 326.2 lb

## 2018-01-24 DIAGNOSIS — G4733 Obstructive sleep apnea (adult) (pediatric): Secondary | ICD-10-CM

## 2018-01-24 DIAGNOSIS — Z794 Long term (current) use of insulin: Secondary | ICD-10-CM

## 2018-01-24 DIAGNOSIS — E1165 Type 2 diabetes mellitus with hyperglycemia: Secondary | ICD-10-CM

## 2018-01-24 DIAGNOSIS — Z7951 Long term (current) use of inhaled steroids: Secondary | ICD-10-CM | POA: Diagnosis not present

## 2018-01-24 DIAGNOSIS — E118 Type 2 diabetes mellitus with unspecified complications: Secondary | ICD-10-CM | POA: Diagnosis not present

## 2018-01-24 DIAGNOSIS — Z9989 Dependence on other enabling machines and devices: Secondary | ICD-10-CM

## 2018-01-24 DIAGNOSIS — J4530 Mild persistent asthma, uncomplicated: Secondary | ICD-10-CM

## 2018-01-24 DIAGNOSIS — Z79899 Other long term (current) drug therapy: Secondary | ICD-10-CM | POA: Diagnosis not present

## 2018-01-24 DIAGNOSIS — J4540 Moderate persistent asthma, uncomplicated: Secondary | ICD-10-CM

## 2018-01-24 LAB — POCT GLYCOSYLATED HEMOGLOBIN (HGB A1C): Hemoglobin A1C: 13 % — AB (ref 4.0–5.6)

## 2018-01-24 LAB — GLUCOSE, CAPILLARY: Glucose-Capillary: 234 mg/dL — ABNORMAL HIGH (ref 70–99)

## 2018-01-24 MED ORDER — INSULIN ASPART 100 UNIT/ML FLEXPEN: 15.0000 [IU] | PEN_INJECTOR | Freq: Three times a day (TID) | SUBCUTANEOUS | 11 refills | Status: DC

## 2018-01-24 MED ORDER — INSULIN PEN NEEDLE 31G X 5 MM MISC: 6 refills | Status: AC

## 2018-01-24 MED ORDER — FUROSEMIDE 40 MG PO TABS: 40.0000 mg | ORAL_TABLET | Freq: Every day | ORAL | 0 refills | Status: DC

## 2018-01-24 NOTE — Progress Notes (Signed)
   CC: F/U on Asthma and DM  HPI:  [name] is a 42 y.o. female who presented to the clinic for continued evaluation and management of her chronic medical illnesses. For a detailed assessment and plan please refer to problem based charting below.   Past Medical History:  Diagnosis Date  . Acute nonintractable headache 12/26/2017  . Arthritis    major joints  . Asthma    prn inhaler  . Asthma exacerbation 07/25/2013  . Asthma in adult, severe persistent, with acute exacerbation 01/11/2018  . Carpal tunnel syndrome of right wrist 09/2012  . Complication of anesthesia    states is hard to wake up post-op  . Constipation 12/30/2014  . Diabetes mellitus    intolerant to Metformin  . Dry skin    hands   . Family history of anesthesia complication    mother went into coma during c-section  . Genital herpes 01/01/2006   on acyclovir BID   . GERD (gastroesophageal reflux disease)   . Herpes genital   . Hyperglycemia 12/26/2017  . Low back pain 11/22/2012  . Lower extremity edema 10/18/2012  . Menorrhagia 11/06/2015  . Morbid obesity with BMI of 50 to 60 07/23/2009  . Obesity hypoventilation syndrome ([place]) 10/30/2012  . Obesity, morbid ([place]) 01/10/2014  . OSA on CPAP     AHI was on  11-17-12 to 120/hr, titrated to 15 cm with 3 cm EPR/   . PCOS (polycystic ovarian syndrome)   . Rheumatoid arthritis ([place])   . Right ankle pain 06/19/2015  . Right hip pain 09/04/2017  . Right shoulder pain 06/01/2017  . Seasonal allergies   . Sleep apnea, obstructive    uses CPAP nightly - had sleep study 08/22/2007   Review of Systems: 12 point ROS preformed. All negative aside from those mentioned in the HPI.  Physical Exam: Vitals:   01/24/18 1116  BP: (!) 112/53  Pulse: 91  Temp: 98 F (36.7 C)  TempSrc: Oral  SpO2: 96%  Weight: (!) 326 lb 3.2 oz (148 kg)   General: Morbidly obese female in no acute distress Pulm: Good air movement with no wheezing or crackles  CV: RRR, no  murmurs, no rubs  Abdomen: Active bowel sounds, soft, non-distended, no tenderness to palpation  Extremities: Mild to moderate pitting LE edema   Assessment & Plan:   See Encounters Tab for problem based charting.  Patient discussed with Dr. Lynnae January

## 2018-01-24 NOTE — Assessment & Plan Note (Addendum)
Patient with uncontrolled diabetes mellitus. A1c today is above 13. She is currently on Lantus 45 units BID, sitagliptin 129m daily, NovoLog 15 units three times daily, and glipizide 10 mg BID. She was recently started on the NovoLog after her recent hospitalization. She took it consistently for approximately one week and then has not had any more mealtime insulin since that point. She states that when she was taking her NovoLog her sugars were consistently in the low 200s. Off the NovoLog they range from 300-400. She did bring in her glucose meter today; however, was only been checking 1 to 2 times daily. I have asked that she restart her NovoLog and check her sugars four times daily. She will come back in two weeks and we will adjust her insulin regimen accordingly.

## 2018-01-24 NOTE — Patient Instructions (Signed)
Thank you for allowing [place] to provide your care. I will send out a prescription for your meal time insulin. Please continue to check your sugars 3-4 times a day. I will send in a prescription for you to have a sleep study and then send out a prescription for your CPAP supplies. I will see you back in 2 weeks for further changes to your DM regimen.

## 2018-01-24 NOTE — Assessment & Plan Note (Signed)
Patient with moderate persistent asthma presenting for follow-up after hospitalization for acute asthma exacerbation. She is currently on the Dulera (200-5) 2 puffs BID, loratadine 10 mg BID, albuterol PRN, and Singulair 10 mg daily. She is completed her steroid taper and feels that her breathing is returned to baseline. She will continue on all her medications. If she continues to have frequent exacerbations she will likely need referral to a pulmonologist for consideration of biologic.

## 2018-01-24 NOTE — Progress Notes (Signed)
Internal Medicine Clinic Attending  Case discussed with Dr. Tarri Abernethy at the time of the visit.  We reviewed the resident's history and exam and pertinent patient test results.  I agree with the assessment, diagnosis, and plan of care documented in the resident's note.  The patient will require an in lab sleep study rather than a home sleep study due to the presence of difficult to control asthma.

## 2018-01-24 NOTE — Assessment & Plan Note (Signed)
Patient with OSA her CPAP. She is running out of her supplies and needs refill however, during her recent hospitalization it was noted that she had significant hypoxia even on CPAP. Therefore she needs repeat sleep study with titration so that the appropriate settings can be achieved. I will send no referral for her CPAP materials while we wait to get her in for a sleep study.

## 2018-01-26 ENCOUNTER — Other Ambulatory Visit: Payer: Self-pay | Admitting: Internal Medicine

## 2018-01-26 ENCOUNTER — Ambulatory Visit: Payer: Medicaid Other

## 2018-01-26 ENCOUNTER — Other Ambulatory Visit: Payer: Self-pay | Admitting: *Deleted

## 2018-01-26 DIAGNOSIS — J302 Other seasonal allergic rhinitis: Secondary | ICD-10-CM

## 2018-01-27 MED ORDER — LORATADINE 10 MG PO TABS: 10.0000 mg | ORAL_TABLET | Freq: Two times a day (BID) | ORAL | 1 refills | Status: AC

## 2018-02-07 ENCOUNTER — Encounter: Payer: Self-pay | Admitting: Internal Medicine

## 2018-02-07 ENCOUNTER — Ambulatory Visit (INDEPENDENT_AMBULATORY_CARE_PROVIDER_SITE_OTHER): Payer: Medicaid Other | Admitting: Internal Medicine

## 2018-02-07 ENCOUNTER — Other Ambulatory Visit: Payer: Self-pay

## 2018-02-07 VITALS — BP 156/81 | HR 86 | Temp 98.2°F | Ht 62.0 in | Wt 336.5 lb

## 2018-02-07 DIAGNOSIS — Z794 Long term (current) use of insulin: Secondary | ICD-10-CM

## 2018-02-07 DIAGNOSIS — E1165 Type 2 diabetes mellitus with hyperglycemia: Secondary | ICD-10-CM

## 2018-02-07 MED ORDER — ATORVASTATIN CALCIUM 40 MG PO TABS: 40.0000 mg | ORAL_TABLET | Freq: Every day | ORAL | 1 refills | Status: DC

## 2018-02-07 MED ORDER — DAPAGLIFLOZIN PROPANEDIOL 5 MG PO TABS: 5.0000 mg | ORAL_TABLET | Freq: Every day | ORAL | 1 refills | Status: DC

## 2018-02-07 NOTE — Assessment & Plan Note (Addendum)
HPI: Patient presented for two week follow-up after initiation of short acting mealtime insulin. Current medications include sitagliptin 100 mg daily, glipizide 10 mg twice daily, NovoLog 15 units TID with meals, and Lantus 40 units twice daily. Her weight is up approximately 30 pounds over the past four months. She did bring her CBG monitor with her today, and its interpretation can be seen below.  Report summary is from last 30 days,  Average tests per day: 1.1 Average blood glucose: 276 Range: minimum: 135 and maximum: 431 Days without test: 5 % in target range: 11.8 % below target range: 0 % above target range: 88.2 % hypoglycemia: 0 Notes about patterns: Started mealtime insulin half way through recorded dates. Overall downward trend.   Assessment/plan: Patient presents for continued evaluation and management of her uncontrolled type II diabetes mellitus. Most recent A1c of 13%. With the initiation of mealtime insulin her overall glucose trend is downward. However based on her evening blood glucose and fasting blood glucose she appears under dose. We will increase her long acting insulin, Lantus to 45 units BID. We will also add on dapagliflozin 5 mg QD. We discussed continuing to check her sugars four times daily and work on diet and exercise. She will follow-up in four weeks for continued evaluation and management.  ASCVD risk of 16.9%. Patient would benefit from high intensity statin. Will start atorvastatin 40 mg once daily. We will check urine microalbumin today to determine the need for an ACE inhibitor.

## 2018-02-07 NOTE — Progress Notes (Signed)
   CC: F/U DM  HPI:  [name] is a 42 y.o. female with PMHx listed below presenting for continued evaluation and management of her chronic medical illnesses. Please see the A&P for the status of the patient's chronic medical problems.  Past Medical History:  Diagnosis Date  . Acute nonintractable headache 12/26/2017  . Arthritis    major joints  . Asthma    prn inhaler  . Asthma exacerbation 07/25/2013  . Asthma in adult, severe persistent, with acute exacerbation 01/11/2018  . Carpal tunnel syndrome of right wrist 09/2012  . Complication of anesthesia    states is hard to wake up post-op  . Constipation 12/30/2014  . Diabetes mellitus    intolerant to Metformin  . Dry skin    hands   . Family history of anesthesia complication    mother went into coma during c-section  . Genital herpes 01/01/2006   on acyclovir BID   . GERD (gastroesophageal reflux disease)   . Herpes genital   . Hyperglycemia 12/26/2017  . Low back pain 11/22/2012  . Lower extremity edema 10/18/2012  . Menorrhagia 11/06/2015  . Morbid obesity with BMI of 50 to 60 07/23/2009  . Obesity hypoventilation syndrome ([place]) 10/30/2012  . Obesity, morbid ([place]) 01/10/2014  . OSA on CPAP     AHI was on  11-17-12 to 120/hr, titrated to 15 cm with 3 cm EPR/   . PCOS (polycystic ovarian syndrome)   . Rheumatoid arthritis ([place])   . Right ankle pain 06/19/2015  . Right hip pain 09/04/2017  . Right shoulder pain 06/01/2017  . Seasonal allergies   . Sleep apnea, obstructive    uses CPAP nightly - had sleep study 08/22/2007   Review of Systems: Performed and all others negative.  Physical Exam: Vitals:   02/07/18 1446  BP: (!) 156/81  Pulse: 86  Temp: 98.2 F (36.8 C)  TempSrc: Oral  SpO2: 95%  Weight: (!) 336 lb 8 oz (152.6 kg)  Height: 5' 2"  (1.575 m)   General: Morbidly obese female in no acute distress Pulm: Good air movement with no wheezing or crackles  CV: RRR, no murmurs, no rubs  Abdomen: Active  bowel sounds, soft, non-distended, no tenderness to palpation  Extremities: Pulses palpable in all extremities, Mild pitting edema of the LEs  Assessment & Plan:   See Encounters Tab for problem based charting.  Patient discussed with Dr. Eppie Gibson

## 2018-02-07 NOTE — Patient Instructions (Addendum)
Thank you for allowing me to provide your care. You are okay to start exercising again. Keep up the good work with your diabetes. Please increase your Lantus to 45 units twice daily and start checking her sugars before you eat. Continue all other medications as prescribed. Today we are starting a medication called Dapagliflozin Wilder Glade). This medication should help you lose weight and pee out the extra sugar. There also starting a medication called atorvastatin, this medication is to protect your heart and blood vessels.  I will see you back in four weeks or sooner if anything arises. I hope you and your family have a blessed Christmas and New Year's.

## 2018-02-08 LAB — MICROALBUMIN / CREATININE URINE RATIO
Creatinine, Urine: 52 mg/dL
Microalb/Creat Ratio: 35.6 mg/g creat — ABNORMAL HIGH (ref 0.0–30.0)
Microalbumin, Urine: 18.5 ug/mL

## 2018-02-09 ENCOUNTER — Telehealth: Payer: Self-pay | Admitting: *Deleted

## 2018-02-09 NOTE — Progress Notes (Signed)
Case discussed with Dr. Helberg at the time of the visit. We reviewed the resident's history and exam and pertinent patient test results. I agree with the assessment, diagnosis, and plan of care documented in the resident's note. 

## 2018-02-09 NOTE — Telephone Encounter (Addendum)
Information was sent through [facility] for PA for [place].  Awaiting determination .  Conf # [id number] Nelva Nay, RN 02/09/2018 9:41 AM.  PA for [place] approved 02/09/2018 thru 12/18/20120.  Sander Nephew, RN 02/13/2018 11:10 AM.

## 2018-02-26 ENCOUNTER — Other Ambulatory Visit: Payer: Self-pay | Admitting: Internal Medicine

## 2018-02-26 DIAGNOSIS — J45909 Unspecified asthma, uncomplicated: Secondary | ICD-10-CM

## 2018-02-26 DIAGNOSIS — E1165 Type 2 diabetes mellitus with hyperglycemia: Secondary | ICD-10-CM

## 2018-02-26 DIAGNOSIS — Z794 Long term (current) use of insulin: Principal | ICD-10-CM

## 2018-03-05 ENCOUNTER — Other Ambulatory Visit: Payer: Self-pay | Admitting: Internal Medicine

## 2018-03-08 ENCOUNTER — Telehealth: Payer: Self-pay | Admitting: Neurology

## 2018-03-08 NOTE — Telephone Encounter (Signed)
Called the patient as I was looking into her chart to prepare for her apt. Upon looking at her chart the patient has not been seen here since 2016 which would require a new sleep study referral. I also saw the patient is scheduled to have a sleep study through the [facility] on 03/22/2018 in which if that is the case she would have to follow up with the sleep MD that is ordering and reading that study.   LVM for the patient to call back. Please have the patient inform us whether she would like to continue to follow care here with Dr Dohmeier if so then the patient would need to cancel her upcoming sleep study with the [place] sleep [facility] so that she could have it completed here. Also the patient should have her PCP send a updated referral to the [place] sleep lab.

## 2018-03-14 ENCOUNTER — Telehealth: Payer: Self-pay | Admitting: Neurology

## 2018-03-14 ENCOUNTER — Encounter

## 2018-03-14 ENCOUNTER — Ambulatory Visit: Payer: Medicaid Other | Admitting: Neurology

## 2018-03-14 NOTE — Telephone Encounter (Signed)
Pt was no show today for apt as she came and was here to check in at 11:11 am. We were unable to see her due to her being late. Patient is in need of new CPAP supplies so that she can sleep at night. Advised the patient that since we were unable to see her today and technically we haven't seen her since  2016 she at this point would require a new referral sent to our office. Advised the patient that I also saw where she was ordered to have a sleep study next week. Advised the patient that if she is wanting Dr Brett Fairy to follow her care then she would need to cancel that sleep study because it was not scheduled to be completed here and Dr Dohmeier only reads and treats from her sleep study completed in the sleep lab here. Patient is wanting to sit in waiting room in case someone doesn't show or cancels. I had a cancellation for sleep consult tomorrow as of this moment. Informed the patient we need the referral first though. Patient is going to contact the office and I will also send a notification to PCP through epic to see if they will place referral order.

## 2018-03-15 ENCOUNTER — Telehealth: Payer: Self-pay | Admitting: Internal Medicine

## 2018-03-15 ENCOUNTER — Encounter: Payer: Self-pay | Admitting: Neurology

## 2018-03-15 ENCOUNTER — Ambulatory Visit: Payer: Medicaid Other | Admitting: Neurology

## 2018-03-15 ENCOUNTER — Other Ambulatory Visit: Payer: Self-pay | Admitting: Neurology

## 2018-03-15 DIAGNOSIS — E662 Morbid (severe) obesity with alveolar hypoventilation: Secondary | ICD-10-CM

## 2018-03-15 DIAGNOSIS — Z6841 Body Mass Index (BMI) 40.0 and over, adult: Secondary | ICD-10-CM

## 2018-03-15 DIAGNOSIS — G4710 Hypersomnia, unspecified: Secondary | ICD-10-CM

## 2018-03-15 DIAGNOSIS — G4733 Obstructive sleep apnea (adult) (pediatric): Secondary | ICD-10-CM

## 2018-03-15 DIAGNOSIS — J9620 Acute and chronic respiratory failure, unspecified whether with hypoxia or hypercapnia: Secondary | ICD-10-CM

## 2018-03-15 DIAGNOSIS — G478 Other sleep disorders: Secondary | ICD-10-CM

## 2018-03-15 DIAGNOSIS — G4730 Sleep apnea, unspecified: Secondary | ICD-10-CM

## 2018-03-15 DIAGNOSIS — J4551 Severe persistent asthma with (acute) exacerbation: Secondary | ICD-10-CM

## 2018-03-15 DIAGNOSIS — Z9989 Dependence on other enabling machines and devices: Principal | ICD-10-CM

## 2018-03-15 DIAGNOSIS — E1165 Type 2 diabetes mellitus with hyperglycemia: Secondary | ICD-10-CM | POA: Diagnosis not present

## 2018-03-15 NOTE — Progress Notes (Signed)
SLEEP MEDICINE CLINIC   Provider:  Larey Seat, MD   Primary Care Physician:  Ina Homes, MD   Referring Provider: Ina Homes, MD    Chief Complaint  Patient presents with  . New Patient (Initial Visit)    pt alone, rm 10. pt here for sleep consult.  she has been using CPAP for years and had a sleep study completed in 2014. DME [facility]. pt has not followed up here and is in need of new supplies. her mask broke in october and she has been unable to use because not able to get new supplies. patient has been unable to sleep at night.pt is also due for a new machine.     HPI:  Ana Long is a 43 y.o. female , seen here as in a referral  from Dr. Tarri Abernethy for a sleep evaluation.  Mrs. Cogan-Ward happiness patient of our sleep practice when she was under Dr. Dimitri Ped care, I performed a split-night polysomnography with her on 17 November 2012 at the time she had endorsed the Epworth Sleepiness Scale at 18 points and had a BMI of 57, she was short of breath at rest.  Her AHI was 120 of the highest apnea and hypopnea indeces seen in our care. The patient received an S9 Elite CPAP machine after she had been titrated to a pressure of 15 cmH2O with 3 cm EPR she also used a Swift fracture interface at the time.  She continued to use her CPAP until recently but it broke.  As far as I can see she has no compliance data for November which was when she was hospitalized.  During her hospitalization it was evident that she had severe hypoxemia whenever she slept her oxygen levels desaturated.  Looking at her last compliance report for the last 30 days she has been only 7% or 2 days able to use her CPAP machine for longer than 4 hours, so this is a poor compliance but she assured me that her machine is not working.  She does no longer have a fresh mask or interface that would seal.  The residual AHI is between 11.8 and 9.6, she has high air leaks with a maximum up to 61.8 L/min.   In other words her download are invalid.  Patient needs an urgent return for a CPAP titration also to see if sleep hypoxemia is sufficiently treated with CPAP alone or if she has need for oxygen.   She has many other chronic medical problems as I can see under the progress notes that were cosigned by Oval Linsey, MD.  Polycystic ovarian syndrome rheumatoid arthritis, hyperglycemia, gastroesophageal reflux disease, diabetes mellitus type 2, asthma exacerbation in adult severe and persistent, shortness of breath, major joint osteoarthritis, acute non-intractable headaches, shoulder pain back pain ankle pain hip pain.  Her current weight exceeds 330 pounds, her diabetes was considered uncontrolled.  She currently endorsed the fatigue severity score 45 points out of 63, the Epworth Sleepiness Scale was endorsed at 19 out of 24 points.  Chief complaint according to patient :" I have dropped my oxygen sats in the hospital? Had a severe asthma attack at the time, too.   Sleep habits are as follows: Dinner time is 7 Pm and bedtime is at 2-3 AM, former third shift worker- now disabled. she spends her evenings after dinner with dinner prep for the next day, homework for the children and laundry. The bedroom is either cool, nor quiet but dark. She has  a Warehouse manager ( !) with a night light in her bedroom. Uses albuterol and breathing treatment at bedtime.  She has nocturia times 2-3 , she sleeps on her elevated bed- supine ,on multiple pillows.  Has orthopneoa, children report her snoring, pausing , her chest rattling. She wakes at 5 AM to get her children to school, who are 27, 72, 36 years old.   Sleep medical history and family sleep history: OSA severe for a long time, see previous sleep study.  Family history of OSA on maternal side, 2 uncles, one aunt and cousins. Paternal aunts and father himself.   Social history:  Non smoker, raises 3 children , but has guardianship over 3 children, mother    She would like to adopt. No alcohol.     Review of Systems: Out of a complete 14 system review, the patient complains of only the following symptoms, and all other reviewed systems are negative.  extreme wheezing and shortness of breath, tachypnea at rest 16/ minute.   Epworth Sleep score: 19/ 24  , Fatigue severity score 45 /63 points  , depression score N/a    Social History   Socioeconomic History  . Marital status: Divorced    Spouse name: Not on file  . Number of children: 2  . Years of education: 17  . Highest education level: Not on file  Occupational History  . Occupation: UNEMPLOYED    Employer: UNEMPLOYED  Social Needs  . Financial resource strain: Not on file  . Food insecurity:    Worry: Not on file    Inability: Not on file  . Transportation needs:    Medical: Not on file    Non-medical: Not on file  Tobacco Use  . Smoking status: Former Smoker    Years: 10.00    Last attempt to quit: 05/24/2010    Years since quitting: 7.8  . Smokeless tobacco: Never Used  Substance and Sexual Activity  . Alcohol use: Yes    Alcohol/week: 0.0 standard drinks    Comment: socially  . Drug use: No  . Sexual activity: Yes    Birth control/protection: None    Comment: Lives w/a partner  Lifestyle  . Physical activity:    Days per week: Not on file    Minutes per session: Not on file  . Stress: Not on file  Relationships  . Social connections:    Talks on phone: Not on file    Gets together: Not on file    Attends religious service: Not on file    Active member of club or organization: Not on file    Attends meetings of clubs or organizations: Not on file    Relationship status: Not on file  . Intimate partner violence:    Fear of current or ex partner: Not on file    Emotionally abused: Not on file    Physically abused: Not on file    Forced sexual activity: Not on file  Other Topics Concern  . Not on file  Social History Narrative   Single, but lives with a  partner   Has 2 children and cares for an infant during the day          Family History  Problem Relation Age of Onset  . Other Mother   . Leukemia Cousin   . Other Other   . Obesity Other   . Other Other   . Diabetes Maternal Aunt   . Heart attack Maternal Aunt   .  Alzheimer's disease Maternal Uncle   . Cancer Maternal Uncle        prostate  . Diabetes Paternal Aunt   . Cancer Paternal Aunt        brain,mouth  . Alzheimer's disease Paternal Aunt   . Diabetes Paternal Uncle   . Diabetes Maternal Grandmother   . Diabetes Maternal Grandfather     Past Medical History:  Diagnosis Date  . Acute nonintractable headache 12/26/2017  . Arthritis    major joints  . Asthma    prn inhaler  . Asthma exacerbation 07/25/2013  . Asthma in adult, severe persistent, with acute exacerbation 01/11/2018  . Carpal tunnel syndrome of right wrist 09/2012  . Complication of anesthesia    states is hard to wake up post-op  . Constipation 12/30/2014  . Diabetes mellitus    intolerant to Metformin  . Dry skin    hands   . Family history of anesthesia complication    mother went into coma during c-section  . Genital herpes 01/01/2006   on acyclovir BID   . GERD (gastroesophageal reflux disease)   . Herpes genital   . Hyperglycemia 12/26/2017  . Low back pain 11/22/2012  . Lower extremity edema 10/18/2012  . Menorrhagia 11/06/2015  . Morbid obesity with BMI of 50 to 60 07/23/2009  . Obesity hypoventilation syndrome ([place]) 10/30/2012  . Obesity, morbid ([place]) 01/10/2014  . OSA on CPAP     AHI was on  11-17-12 to 120/hr, titrated to 15 cm with 3 cm EPR/   . PCOS (polycystic ovarian syndrome)   . Rheumatoid arthritis ([place])   . Right ankle pain 06/19/2015  . Right hip pain 09/04/2017  . Right shoulder pain 06/01/2017  . Seasonal allergies   . Sleep apnea, obstructive    uses CPAP nightly - had sleep study 08/22/2007    Past Surgical History:  Procedure Laterality Date  . CARPAL TUNNEL RELEASE Right  10/02/2012   Procedure: RIGHT CARPAL TUNNEL RELEASE ENDOSCOPIC;  Surgeon: Jolyn Nap, MD;  Location: [facility];  Service: Orthopedics;  Laterality: Right;  . CHOLECYSTECTOMY N/A 12/02/2014   Procedure: LAPAROSCOPIC CHOLECYSTECTOMY;  Surgeon: Armandina Gemma, MD;  Location: [facility];  Service: General;  Laterality: N/A;  . HYSTEROSCOPY W/D&C  01-08-2002    Current Outpatient Medications  Medication Sig Dispense Refill  . acyclovir (ZOVIRAX) 400 MG tablet TAKE 1 TABLET BY MOUTH TWICE A DAY 60 tablet 2  . albuterol (PROAIR HFA) 108 (90 Base) MCG/ACT inhaler Inhale 2 puffs into the lungs every 4 (four) hours as needed for wheezing or shortness of breath (cough). (Patient taking differently: Inhale 2 puffs into the lungs every 4 (four) hours as needed for wheezing or shortness of breath (or coughing). ) 6.7 g 2  . atorvastatin (LIPITOR) 40 MG tablet Take 1 tablet (40 mg total) by mouth at bedtime. 90 tablet 1  . benzonatate (TESSALON) 200 MG capsule Take 1 capsule (200 mg total) by mouth 3 (three) times daily as needed for cough. 20 capsule 0  . Blood Glucose Monitoring Suppl (ACCU-CHEK AVIVA PLUS) w/Device KIT Please check you blood sugars 4 times a day 1 kit 0  . dapagliflozin propanediol (FARXIGA) 5 MG TABS tablet Take 5 mg by mouth daily. 90 tablet 1  . diclofenac (VOLTAREN) 75 MG EC tablet Take 1 tablet (75 mg total) by mouth 2 (two) times daily. (Patient taking differently: Take 75 mg by mouth 2 (two) times daily as needed (for pain or  inflammation). ) 60 tablet 1  . glipiZIDE (GLUCOTROL) 10 MG tablet Take 1 tablet (10 mg total) by mouth 2 (two) times daily before a meal. 60 tablet 3  . glucose blood (ACCU-CHEK SMARTVIEW) test strip TEST FOUR TIMES DAILY 150 each 6  . ibuprofen (ADVIL,MOTRIN) 600 MG tablet Take 1 tablet (600 mg total) by mouth every 8 (eight) hours as needed. (Patient taking differently: Take 600 mg by mouth every 8 (eight) hours as needed for headache or mild pain.  ) 90 tablet 1  . insulin aspart (NOVOLOG) 100 UNIT/ML FlexPen Inject 15 Units into the skin 3 (three) times daily with meals. 15 mL 11  . Insulin Glargine (LANTUS SOLOSTAR) 100 UNIT/ML Solostar Pen Inject 45 Units into the skin 2 (two) times daily. 15 mL 12  . Insulin Pen Needle (B-D UF III MINI PEN NEEDLES) 31G X 5 MM MISC USE AS DIRECTED TO INJECT INSULIN FIVE A DAY E11.65 100 each 6  . JANUVIA 100 MG tablet TAKE 1 TABLET BY MOUTH EVERY DAY 90 tablet 3  . lactulose (CEPHULAC) 10 g packet Take 1 packet (10 g total) by mouth daily as needed. Reported on 07/29/2015 (Patient taking differently: Take 10 g by mouth daily as needed (for constipation). ) 30 each 0  . Lancets (ACCU-CHEK SOFT TOUCH) lancets Use as instructed 100 each 12  . loratadine (CLARITIN) 10 MG tablet Take 1 tablet (10 mg total) by mouth 2 (two) times daily. 180 tablet 1  . methocarbamol (ROBAXIN) 500 MG tablet Take 1 tablet (500 mg total) by mouth every 8 (eight) hours as needed. (Patient taking differently: Take 500 mg by mouth every 8 (eight) hours as needed for muscle spasms. ) 60 tablet 1  . mometasone-formoterol (DULERA) 200-5 MCG/ACT AERO Inhale 2 puffs into the lungs 2 (two) times daily. 13 Inhaler 1  . montelukast (SINGULAIR) 10 MG tablet Take 1 tablet (10 mg total) by mouth at bedtime. 90 tablet 1  . nitroGLYCERIN (NITRODUR - DOSED IN MG/24 HR) 0.2 mg/hr patch Apply 1/4th patch to affected achilles, change daily 30 patch 1  . omeprazole (PRILOSEC) 20 MG capsule Take 1 capsule (20 mg total) by mouth daily before breakfast. 90 capsule 2  . Probiotic Product (PROBIOTIC DAILY PO) Take 2 capsules by mouth every evening.     . TURMERIC PO Take 2 capsules by mouth at bedtime.     . furosemide (LASIX) 40 MG tablet Take 1 tablet (40 mg total) by mouth daily for 5 days. 5 tablet 0   No current facility-administered medications for this visit.     Allergies as of 03/15/2018 - Review Complete 03/15/2018  Allergen Reaction Noted  .  Bee venom Anaphylaxis and Swelling 01/11/2018  . Wasp venom Anaphylaxis and Swelling 01/11/2018  . Citrus Hives and Itching 07/20/2012  . Latex Hives and Swelling 07/20/2012  . Penicillins Hives and Itching 01/01/2006  . Soap Hives, Itching, and Swelling 07/20/2012  . Tomato Hives and Itching 07/20/2012  . Banana Itching and Nausea Only 01/11/2018  . Chocolate Itching 01/11/2018  . Fluticasone Other (See Comments) 12/02/2014  . Hydrocodone Itching and Nausea Only 02/12/2015  . Other Itching 12/02/2014  . Shellfish allergy Other (See Comments) 12/02/2014  . Adhesive [tape] Itching and Rash 01/11/2018  . Doxycycline Other (See Comments) 01/01/2006  . Metformin and related Nausea Only, Rash, and Other (See Comments) 12/13/2010  . Red dye Itching, Rash, and Other (See Comments) 01/11/2018    Vitals: There were no vitals taken  for this visit. Last Weight:  Wt Readings from Last 1 Encounters:  02/07/18 (!) 336 lb 8 oz (152.6 kg)   GEX:BMWUX is no height or weight on file to calculate BMI.     Last Height:   Ht Readings from Last 1 Encounters:  02/07/18 5' 2"  (1.575 m)   Resp. Rate 16/min at rest, dysphonia, wheezing.  Physical exam:  General: The patient is awake, alert and appears not in acute distress. The patient is well groomed. Head: Normocephalic, atraumatic. Neck is supple. Mallampati 5  neck circumference:21. 5 ".  Nasal airflow congested  Retrognathia is seen.  Cardiovascular:  Regular rate and rhythm , without murmurs or carotid bruit, and without distended neck veins. Respiratory: wheezing to auscultation. Skin:  With ankle edema, no  Rash. Trunk: weight 317 pounds. BMI is over 58 . The patient's posture is stooped.    Neurologic exam : The patient is awake and alert, oriented to place and time.     Attention span & concentration ability appears normal.  Speech is non fluent,  with  dysphonia and interrupted by gasping for breath  Mood and affect are  appropriate.  Cranial nerves: Pupils are equal and briskly reactive to light. Retinopathy due uncontrolled DM, microbleed, swelling. Extraocular movements  in vertical and horizontal planes intact and without nystagmus. Visual fields by finger perimetry are intact. Hearing to finger rub intact.   Facial sensation intact to fine touch.  Facial motor strength is symmetric and tongue and uvula move midline. Shoulder shrug was symmetrical.   Motor exam:  Normal tone, muscle bulk and symmetric strength in upper extremities.  Sensory: reports sharp pains arising from her toes and through the feet= mainly hallux. Fine touch, pinprick and vibration were decreased in lower extremities. Proprioception tested in the upper extremities was normal.  Coordination: Rapid alternating movements in the fingers/hands was normal. Finger-to-nose maneuver  normal without evidence of ataxia, dysmetria or tremor. Gait and station: Patient walks without assistive device but is SOB, stooped, and wide based. Strength appears within normal limits.ROM limited by hip and knee pain.  Deep tendon reflexes: in the  upper and lower extremities are symmetrically attenuated .  Assessment:  After physical and neurologic examination, review of laboratory studies,  Personal review of imaging studies, reports of other /same  Imaging studies, results of polysomnography and / or neurophysiology testing and pre-existing records as far as provided in visit., my assessment is :   1) severe Shortness of breath with asthma and presumed obesity hypoventilation.  2) OSA She has no longer a working CPAP at home and needs immediately a new machine. Has not followed up in 4 years.   2)  3) morbid- superobese. Referral to Dr. Leafy Ro.  4) she is looking to establish care outside the [facility] service at [facility].    The patient was advised of the nature of the diagnosed disorder, the treatment options and the  risks for general  health and wellness arising from not treating the condition.   I spent more than 50 minutes of face to face time with the patient.  Greater than 50% of time was spent in counseling and coordination of care. We have discussed the diagnosis and differential and I answered the patient's questions.    Plan:  Treatment plan and additional workup :  Immediate SPLIT night PSG- I believe we can be establishing diagnosis in 1 hour baseline.\ Please give her a wedge for her bed, needs elevated head of  bed for her orthopnea,  CO2 measures, consider oxygen if CPAP does not break hypoxemia.   ASAP follow up with NP after sleep study.   Larey Seat, MD 11/06/7827, 56:21 PM  Certified in Neurology by [employer] Certified in [place] by [facility] [address], [place], [place] [address]

## 2018-03-15 NOTE — Telephone Encounter (Signed)
Patient needs CPAP titration study. Referral sent. Scheduled for today.

## 2018-03-15 NOTE — Telephone Encounter (Signed)
Hi Casey,   I apologize for the late response. I have placed the referral. Please let me know if there are any issues. My cell phone is [phone] if there are any issues with the referral.   Thank you,  Ina Homes, MD

## 2018-03-22 ENCOUNTER — Encounter

## 2018-03-22 ENCOUNTER — Encounter (HOSPITAL_BASED_OUTPATIENT_CLINIC_OR_DEPARTMENT_OTHER): Payer: Medicaid Other

## 2018-03-29 ENCOUNTER — Telehealth: Payer: Self-pay | Admitting: Dietician

## 2018-03-31 ENCOUNTER — Ambulatory Visit (INDEPENDENT_AMBULATORY_CARE_PROVIDER_SITE_OTHER): Payer: Medicaid Other | Admitting: Neurology

## 2018-03-31 DIAGNOSIS — R0682 Tachypnea, not elsewhere classified: Secondary | ICD-10-CM

## 2018-03-31 DIAGNOSIS — J9620 Acute and chronic respiratory failure, unspecified whether with hypoxia or hypercapnia: Secondary | ICD-10-CM

## 2018-03-31 DIAGNOSIS — E662 Morbid (severe) obesity with alveolar hypoventilation: Secondary | ICD-10-CM

## 2018-03-31 DIAGNOSIS — G478 Other sleep disorders: Secondary | ICD-10-CM

## 2018-03-31 DIAGNOSIS — G4733 Obstructive sleep apnea (adult) (pediatric): Secondary | ICD-10-CM | POA: Diagnosis not present

## 2018-03-31 DIAGNOSIS — Z6841 Body Mass Index (BMI) 40.0 and over, adult: Secondary | ICD-10-CM

## 2018-03-31 DIAGNOSIS — G4730 Sleep apnea, unspecified: Secondary | ICD-10-CM

## 2018-03-31 DIAGNOSIS — J4551 Severe persistent asthma with (acute) exacerbation: Secondary | ICD-10-CM

## 2018-03-31 DIAGNOSIS — G4710 Hypersomnia, unspecified: Secondary | ICD-10-CM

## 2018-03-31 DIAGNOSIS — E1165 Type 2 diabetes mellitus with hyperglycemia: Secondary | ICD-10-CM

## 2018-04-03 NOTE — Telephone Encounter (Signed)
Mailbox is full. Unable to leave a message.

## 2018-04-04 DIAGNOSIS — J9620 Acute and chronic respiratory failure, unspecified whether with hypoxia or hypercapnia: Secondary | ICD-10-CM | POA: Insufficient documentation

## 2018-04-04 NOTE — Procedures (Signed)
PATIENT'S NAME:  Eily, Louvier DOB:      Dec 06, 1975      MR#:    [id number]     DATE OF RECORDING: 03/31/2018 REFERRING M.D.:  Ina Homes MD Study Performed:  Split-Night Titration Study, patient needs to replace machine  HISTORY: Mrs. Hauger-Ward became an established patient on 17 November 2012- at the time she had endorsed the Epworth Sleepiness Scale at 18 points and had a BMI of 57, she was short of breath at rest.  Her AHI was 120/h, one of the highest apnea and hypopnea indices seen in our care. The patient received an S9 Elite CPAP machine after she had been titrated to a pressure of 15 cmH2O with 3 cm EPR she also used a Swift fracture interface at the time.  She continued to use her CPAP until recently, but it broke.  As far as I can see she has no compliance data for November which was when she was hospitalized.  During her hospitalization it was evident that she had severe hypoxemia whenever she slept her oxygen desaturated.  Patient needs an urgent return for a CPAP titration also to see if sleep hypoxemia is sufficiently treated with CPAP alone or if she has need for oxygen. Patient prefers nasal pillows.  43 yr old female here for a split-night PSG. Medical Hx Asthma, currently untreated OSA, EDS, Obesity Hypoventilation syndrome, Diabetes, Super-Obesity, Arthritis. The patient endorsed the Epworth Sleepiness Scale at 19/24 points.   The patient's weight 336 pounds with a height of 62 (inches), resulting in a BMI of 61.7 kg/m2. The patient's neck circumference measured 21.5 inches.  CURRENT MEDICATIONS: Zovirax, ProAir, Lipitor, Tessalon, Farxiga, Glucotrol, Robaxin, Claritin, Dulera, Singulair, Prilosec, Nitrodur, Turmeric, Lasix   PROCEDURE:  This is a multichannel digital polysomnogram utilizing the Somnostar 11.2 system.  Electrodes and sensors were applied and monitored per AASM Specifications.   EEG, EOG, Chin and Limb EMG, were sampled at 200 Hz.  ECG, Snore and  Nasal Pressure, Thermal Airflow, Respiratory Effort, CPAP Flow and Pressure, Oximetry was sampled at 50 Hz. Digital video and audio were recorded.      BASELINE STUDY WITHOUT CPAP RESULTS: Lights Out was at 21:26 and Lights On at 04:54.  Total recording time (TRT) was 78.5, with a total sleep time (TST) of 67.5 minutes.  The patient's sleep latency was 1 minute. The sleep efficiency was 86.0 %.  SLEEP ARCHITECTURE: WASO (Wake after sleep onset) was 10 minutes, Stage N1 was 0.5 minutes, Stage N2 was 67 minutes, Stage N3 was 0 minutes and Stage R (REM sleep) was 0 minutes.  The percentages were Stage N1 .7%, Stage N2 99.3%, Stage N3 0% and Stage R (REM sleep) 0%.   RESPIRATORY ANALYSIS:  There were a total of 120 respiratory events:  119 obstructive apneas, 1 hypopnea, 0 respiratory event related arousals (RERAs). Snoring was noted.    The total APNEA/HYPOPNEA INDEX (AHI) was 106.7 /hour.  All sleep NREM- non-REM AHI of 106.7 /hour. The patient spent all 430.5 minutes of sleep time in the supine position. The supine AHI was 106.7 /hour. OXYGEN SATURATION & C02:  The wake baseline 02 saturation was 90%, with the lowest being 71%. Time spent below 89% saturation equaled 62 minutes. We were unable to provide Co2 monitoring.   AROUSALS: The arousals were noted as: 1 were spontaneous, 0 were associated with PLMs, and 111 were associated with respiratory events. The patient had a total of 0 Periodic Limb Movements and no nocturia.  Loud Snoring was noted. EKG was in keeping with normal sinus rhythm (NSR)   TITRATION STUDY WITH CPAP RESULTS:   CPAP titration was performed under an unnamed interface, initiated at 5 cmH20 with use of a heated humidity per AASM split night standards. The pressure was advanced to 11cmH20 because of hypopneas, apneas and desaturations.  At a PAP pressure of 11 cmH20, there was a reduction of the AHI to1.4 /hour.   Total recording time (TRT) was 370 minutes, with a total sleep  time (TST) of 363 minutes. The patient's sleep latency was 4.5 minutes. REM latency was 10.5 minutes.  The sleep efficiency was 98.1 %.    SLEEP ARCHITECTURE: Wake after sleep was 7 minutes, Stage N1 0.5 minutes, Stage N2 223 minutes, Stage N3 107.5 minutes and Stage R (REM sleep) 32 minutes. The percentages were: Stage N1 .1%, Stage N2 61.4%, Stage N3 29.6% and Stage R (REM sleep) 8.8%. The sleep architecture was notable for REM rebound RESPIRATORY ANALYSIS:  There were a total of 28 respiratory events: 3 obstructive apneas, 25 hypopneas with few respiratory event related arousals (RERAs). The total APNEA/HYPOPNEA INDEX (AHI) was 4.6 /hour.  4 events occurred in REM sleep and 24 events in NREM. The REM AHI was 7.5 /hour versus a non-REM AHI of 4.4 /hour. The patient spent 100% of total sleep time in the supine position. The supine AHI was 4.6 /hour.  OXYGEN SATURATION & C02:  The wake baseline 02 saturation was 0%, with the lowest being 75%. Time spent below 89% saturation equaled still 50 minutes.  AROUSALS: The arousals were noted as: 8 were spontaneous, 0 were associated with PLMs, and only 8 were associated with respiratory events. The patient had a total of 0 Periodic Limb Movements.   POLYSOMNOGRAPHY IMPRESSION : Severest Obstructive Sleep Apnea (OSA) with associated severe hypoxemia, responding to 11 cm water CPAP.   1. Primary Snoring, OSA and hypoxemia improved, but patient is known to have obesity hypoventilation and may still need an ONO on CPAP at home.   RECOMMENDATIONS: I ordered: mask of patients choice, heated humidity, auto CPAP set between 4 and 15 cm water with 3 cm EPR. The technologist did not note with which mask the patient was fitted . A follow up appointment will be scheduled in the Sleep Clinic at [facility].     I certify that I have reviewed the entire raw data recording prior to the issuance of this report in accordance with the Standards of  Accreditation of the American Academy of Sleep Medicine (AASM)  Larey Seat, M.D.  04-04-2018  Diplomat, American Board of Psychiatry and Neurology  Diplomat, [place] of Sleep Medicine Medical Director, [place] [facility]

## 2018-04-04 NOTE — Addendum Note (Signed)
Addended by: Larey Seat on: 04/04/2018 05:14 PM   Modules accepted: Orders

## 2018-04-05 ENCOUNTER — Telehealth: Payer: Self-pay | Admitting: Neurology

## 2018-04-05 ENCOUNTER — Telehealth: Payer: Self-pay

## 2018-04-05 NOTE — Telephone Encounter (Signed)
Called patient to discuss sleep study results. No answer at this time. LVM for the patient to call back.

## 2018-04-05 NOTE — Telephone Encounter (Signed)
-----   Message from Larey Seat, MD sent at 04/04/2018  5:14 PM EST ----- POLYSOMNOGRAPHY IMPRESSION : Severest Obstructive Sleep Apnea  (OSA) with associated severe hypoxemia, responding to 11 cm water  CPAP.  1. Primary Snoring, OSA and hypoxemia improved, but patient is  known to have obesity hypoventilation and may still need an ONO  on CPAP at home.   RECOMMENDATIONS: I ordered: mask of patients choice, heated  humidity, auto CPAP set between 4 and 15 cm water with 3 cm EPR.  The technologist did not note with which mask the patient was  fitted .

## 2018-04-05 NOTE — Telephone Encounter (Signed)
Noted, will let the DME company know this information when they set the pt up

## 2018-04-05 NOTE — Telephone Encounter (Signed)
F&P simplus size small was used in the cpap study sone on 03/31/2018

## 2018-04-06 NOTE — Telephone Encounter (Signed)
Called the patient for the 2nd time today to review sleep study results. No answer. LVM for the pt to call back

## 2018-04-10 NOTE — Telephone Encounter (Signed)
Pt returned RN's call

## 2018-04-10 NOTE — Telephone Encounter (Signed)
I called pt. I advised pt that Dr. Brett Fairy reviewed their sleep study results and found that pt has severe sleep apnea. Dr. Brett Fairy recommends that pt starts auto CPAP. I reviewed PAP compliance expectations with the pt. Pt is agreeable to starting a CPAP. I advised pt that an order will be sent to a DME, [facility], and [facility] will call the pt within about one week after they file with the pt's insurance. [facility] will show the pt how to use the machine, fit for masks, and troubleshoot the CPAP if needed. A follow up appt was made for insurance purposes with Debbora Presto, NP on Apil 21,2020 at 11:00 am. Pt verbalized understanding to arrive 15 minutes early and bring their CPAP. A letter with all of this information in it will be mailed to the pt as a reminder. I verified with the pt that the address we have on file is correct. Pt verbalized understanding of results. Pt had no questions at this time but was encouraged to call back if questions arise. I have sent the order to [facility] and have received confirmation that they have received the order.

## 2018-04-17 ENCOUNTER — Other Ambulatory Visit: Payer: Self-pay | Admitting: Internal Medicine

## 2018-04-17 DIAGNOSIS — Z794 Long term (current) use of insulin: Principal | ICD-10-CM

## 2018-04-17 DIAGNOSIS — E1165 Type 2 diabetes mellitus with hyperglycemia: Secondary | ICD-10-CM

## 2018-04-17 NOTE — Telephone Encounter (Signed)
Pt had transferred previous lantus rx to another pharmacy-[facility] is now requesting a new rx.   will forward to pcp for review.  Please advise.

## 2018-04-18 ENCOUNTER — Telehealth: Payer: Self-pay

## 2018-04-18 NOTE — Telephone Encounter (Signed)
Requesting to speak with a nurse. Please call pt back.  

## 2018-04-20 ENCOUNTER — Encounter: Payer: Medicaid Other | Admitting: Internal Medicine

## 2018-04-20 NOTE — Progress Notes (Deleted)
Stop glipizide

## 2018-04-20 NOTE — Telephone Encounter (Signed)
Stated she has already had her question answered.

## 2018-04-21 NOTE — Telephone Encounter (Signed)
Unable to reach patient by phone

## 2018-05-01 ENCOUNTER — Telehealth: Payer: Self-pay | Admitting: Neurology

## 2018-05-01 NOTE — Telephone Encounter (Signed)
Patient called in and stated [facility] stated they never received order for cpap machine

## 2018-05-01 NOTE — Telephone Encounter (Signed)
Order was sent to [facility] and confirmed they received it on 04/10/2018. I have resent the order and asked them to get the patient taken care of asap since her apt is scheduled for 06/13/2018.

## 2018-06-05 ENCOUNTER — Ambulatory Visit: Payer: Medicaid Other | Admitting: Family Medicine

## 2018-06-06 ENCOUNTER — Ambulatory Visit: Payer: Medicaid Other | Admitting: Family Medicine

## 2018-06-06 ENCOUNTER — Other Ambulatory Visit: Payer: Self-pay

## 2018-06-06 ENCOUNTER — Encounter: Payer: Self-pay | Admitting: Family Medicine

## 2018-06-06 VITALS — BP 144/87 | HR 83 | Temp 98.3°F | Ht 62.0 in | Wt 316.0 lb

## 2018-06-06 DIAGNOSIS — M25511 Pain in right shoulder: Secondary | ICD-10-CM

## 2018-06-06 MED ORDER — METHYLPREDNISOLONE ACETATE 40 MG/ML IJ SUSP
40.0000 mg | Freq: Once | INTRAMUSCULAR | Status: AC
  Administered 2018-06-06: 40 mg

## 2018-06-06 NOTE — Patient Instructions (Signed)
You have rotator cuff impingement Try to avoid painful activities (overhead activities, lifting with extended arm) as much as possible. Aleve 2 tabs twice a day with food OR ibuprofen 3 tabs three times a day with food for pain and inflammation as needed. Can take tylenol in addition to this. Subacromial injection may be beneficial to help with pain and to decrease inflammation - you were given this today. Do home exercise program with theraband and scapular stabilization exercises daily 3 sets of 10 once a day - wait 5-7 days before restarting this. If not improving at follow-up we will consider MRI, physical therapy, and/or nitro patches. Follow up with me in 5-6 weeks.

## 2018-06-06 NOTE — Progress Notes (Signed)
   HPI  CC: Right shoulder pain  Patient is a 43 year old female presents for follow-up of right shoulder pain.  She has had ongoing shoulder pain for several years.  She was last seen in the fall 2019.  At that time she had an x-ray performed which showed no acute process.  She was started in physical therapy, which she states helped with her shoulder pain somewhat.  The shoulder pain returned 3 weeks ago.  She does not recall any inciting event.  She has been taking ibuprofen with some relief.  She is been doing home exercises.  She states the pain has been progressively worse.  She states the pain wakes her up at night.  She has trouble doing overhead activities.  She denies any numbness tingling in her arm.  She denies any weakness in the arm.  See HPI and/or previous note for associated ROS.  Objective: BP (!) 144/87   Pulse 83   Temp 98.3 F (36.8 C) (Oral)   Ht 5' 2"  (1.575 m)   Wt (!) 316 lb (143.3 kg)   BMI 57.80 kg/m  Gen: Right-Hand Dominant. NAD, well groomed, a/o x3, normal affect.  CV: Well-perfused. Warm.  Resp: Non-labored.  Neuro: Sensation intact throughout. No gross coordination deficits.  Gait: Nonpathologic posture, unremarkable stride without signs of limp or balance issues.  Right shoulder exam: No erythema, warmth, swelling noted.  Tenderness palpation of bicipital groove, tenderness palpation over the [facility] joint, tenderness palpation of the posterior shoulder.  Range of motion limited passively to 100 degrees forward flexion, 90 degrees in abduction, full range of motion external rotation, back pocket and internal rotation.  Strength 5 out of 5 throughout testing.  Significant pain throughout range of motion and strength testing.  Positive Hawkins test.  Negative speeds test, negative empty can test, negative belly press off test.  Patient unable to tolerate crossover test.  Left shoulder exam: No erythema, warmth, swelling noted.  No tenderness palpation on exam.   Full range of motion throughout testing.  Strength 5 5 throughout testing.  NVI distally.  Assessment and plan: Right shoulder pain, likely secondary to impingement syndrome versus partial rotator cuff tear.  We discussed treatment options at today's visit.  Given that she has tried physical therapy, as well as some anti-inflammatories, I will give a subacromial injection today.  Injection given as noted below.  We reinforced home exercises with her at today's visit.  We did discuss if she gets no improvement, the next step would be to obtain an MRI of her shoulder.  We will also recommend she possibly get into formal physical therapy once we get out of COVID-19.  We will see her back in 4 to 6 weeks for follow-up.  INJECTION: Patient was given informed consent, signed copy in the chart. Appropriate time out was taken. Area prepped and draped in usual sterile fashion.  1 cc of methylprednisolone 40 mg/ml plus 3 cc of 1% lidocaine without epinephrine was injected into the right subacromial space using a(n) lateral approach with ultrasound guidance. The patient tolerated the procedure well. There were no complications. Postprocedure instructions were given.   Lewanda Rife, MD [facility] Fellow 06/06/2018 11:58 AM

## 2018-06-08 ENCOUNTER — Telehealth: Payer: Self-pay | Admitting: Family Medicine

## 2018-06-08 NOTE — Telephone Encounter (Signed)
VV webex email sent.

## 2018-06-08 NOTE — Telephone Encounter (Signed)
I called and spoke with the patient regarding changing their apt to a [facility]V. I explained to the patient that we would file their insurance and they gave consent. I also walked through the steps of downloading the app, and starting the virtual meeting, after confirming the patient had access to a smart phone with a working camera and microphone access.

## 2018-06-12 ENCOUNTER — Encounter: Payer: Self-pay | Admitting: Family Medicine

## 2018-06-12 NOTE — Telephone Encounter (Addendum)
LMVM for pt to return call to update chart.  Updated chart.

## 2018-06-12 NOTE — Addendum Note (Signed)
Addended by: Brandon Melnick on: 06/12/2018 03:59 PM   Modules accepted: Orders

## 2018-06-13 ENCOUNTER — Other Ambulatory Visit: Payer: Self-pay

## 2018-06-13 ENCOUNTER — Encounter: Payer: Self-pay | Admitting: Family Medicine

## 2018-06-13 ENCOUNTER — Ambulatory Visit (INDEPENDENT_AMBULATORY_CARE_PROVIDER_SITE_OTHER): Payer: Medicaid Other | Admitting: Family Medicine

## 2018-06-13 DIAGNOSIS — G4733 Obstructive sleep apnea (adult) (pediatric): Secondary | ICD-10-CM

## 2018-06-13 DIAGNOSIS — Z9989 Dependence on other enabling machines and devices: Secondary | ICD-10-CM

## 2018-06-13 NOTE — Telephone Encounter (Signed)
LMVM this am for pt about her cpap download not being able to see this on airview/resmed.  I called her back and she answered I relayed that we did get her cpap download information to disregard my other message.  She relayed wanted to see if her appt could be moved up, I relayed that no, had another pt prior to her.  She verbalized understanding.

## 2018-06-13 NOTE — Progress Notes (Signed)
PATIENT: Ana Long DOB: 03-02-1975  REASON FOR VISIT: follow up HISTORY FROM: patient  Virtual Visit via Telephone Note  I connected with Ana Long on 06/13/18 at 11:00 AM EDT by telephone and verified that I am speaking with the correct person using two identifiers.   I discussed the limitations, risks, security and privacy concerns of performing an evaluation and management service by telephone and the availability of in person appointments. I also discussed with the patient that there may be a patient responsible charge related to this service. The patient expressed understanding and agreed to proceed.   History of Present Illness:  06/13/18 Ana Long is a 43 y.o. female for follow up of OSA on CPAP.  [name] that she has not used her machine regularly.  She was having some trouble with shoulder pain and unable to sleep well at night.  For the past week she has been sleeping better.  She had a steroid injection placed last week.  She states that since she has used her CPAP machine every night and for greater than 4 hours each night.     History (copied from Dr Dohmeier's note on 03/15/2018)  HPI:  Ana Long is a 43 y.o. female , seen here as in a referral  from Dr. Tarri Abernethy for a sleep evaluation.  Mrs. Hartig-Ward happiness patient of our sleep practice when she was under Dr. Dimitri Ped care, I performed a split-night polysomnography with her on 17 November 2012 at the time she had endorsed the Epworth Sleepiness Scale at 18 points and had a BMI of 57, she was short of breath at rest.  Her AHI was 120 of the highest apnea and hypopnea indeces seen in our care. The patient received an S9 Elite CPAP machine after she had been titrated to a pressure of 15 cmH2O with 3 cm EPR she also used a Swift fracture interface at the time.  She continued to use her CPAP until recently but it broke.  As far as I can see she has no  compliance data for November which was when she was hospitalized.  During her hospitalization it was evident that she had severe hypoxemia whenever she slept her oxygen levels desaturated.  Looking at her last compliance report for the last 30 days she has been only 7% or 2 days able to use her CPAP machine for longer than 4 hours, so this is a poor compliance but she assured me that her machine is not working.  She does no longer have a fresh mask or interface that would seal.  The residual AHI is between 11.8 and 9.6, she has high air leaks with a maximum up to 61.8 L/min.  In other words her download are invalid.  Patient needs an urgent return for a CPAP titration also to see if sleep hypoxemia is sufficiently treated with CPAP alone or if she has need for oxygen.   She has many other chronic medical problems as I can see under the progress notes that were cosigned by Oval Linsey, MD.  Polycystic ovarian syndrome rheumatoid arthritis, hyperglycemia, gastroesophageal reflux disease, diabetes mellitus type 2, asthma exacerbation in adult severe and persistent, shortness of breath, major joint osteoarthritis, acute non-intractable headaches, shoulder pain back pain ankle pain hip pain.  Her current weight exceeds 330 pounds, her diabetes was considered uncontrolled.  She currently endorsed the fatigue severity score 45 points out of 63, the Epworth Sleepiness Scale was endorsed at 19  out of 24 points.  Chief complaint according to patient :" I have dropped my oxygen sats in the hospital? Had a severe asthma attack at the time, too.   Sleep habits are as follows: Dinner time is 7 Pm and bedtime is at 2-3 AM, former third shift worker- now disabled. she spends her evenings after dinner with dinner prep for the next day, homework for the children and laundry. The bedroom is either cool, nor quiet but dark. She has a Warehouse manager ( !) with a night light in her bedroom. Uses albuterol and  breathing treatment at bedtime.  She has nocturia times 2-3 , she sleeps on her elevated bed- supine ,on multiple pillows.  Has orthopneoa, children report her snoring, pausing , her chest rattling. She wakes at 5 AM to get her children to school, who are 59, 27, 53 years old.   Sleep medical history and family sleep history: OSA severe for a long time, see previous sleep study.  Family history of OSA on maternal side, 2 uncles, one aunt and cousins. Paternal aunts and father himself.   Social history:  Non smoker, raises 3 children , but has guardianship over 3 children, mother  She would like to adopt. No alcohol.   Observations/Objective:  Generalized: Well developed, in no acute distress  Mentation: Alert oriented to time, place, history taking. Follows all commands speech and language fluent   Assessment and Plan:  43 y.o. year old female  has a past medical history of Acute nonintractable headache (12/26/2017), Arthritis, Asthma, Asthma exacerbation (07/25/2013), Asthma in adult, severe persistent, with acute exacerbation (01/11/2018), Carpal tunnel syndrome of right wrist (02/7614), Complication of anesthesia, Constipation (12/30/2014), Diabetes mellitus, Dry skin, Family history of anesthesia complication, Genital herpes (01/01/2006), GERD (gastroesophageal reflux disease), Herpes genital, Hyperglycemia (12/26/2017), Low back pain (11/22/2012), Lower extremity edema (10/18/2012), Menorrhagia (11/06/2015), Morbid obesity with BMI of 50 to 60 (07/23/2009), Obesity hypoventilation syndrome ([place]) (10/30/2012), Obesity, morbid ([place]) (01/10/2014), OSA on CPAP, PCOS (polycystic ovarian syndrome), Rheumatoid arthritis ([place]), Right ankle pain (06/19/2015), Right hip pain (09/04/2017), Right shoulder pain (06/01/2017), Seasonal allergies, and Sleep apnea, obstructive. with    ICD-10-CM   1. OSA on CPAP G47.33    Z99.89    We have discussed the importance of continued CPAP therapy.  Fortunately she is able to  tolerate therapy and has been using it consistently over the past week.  I have reeducated her on the risk of untreated sleep apnea.  I have encouraged her to continue daily use in greater than 4 hours each day.  We will follow-up with her in 3 months to ensure compliance.  She verbalizes understanding and agreement with this plan.  No orders of the defined types were placed in this encounter.   No orders of the defined types were placed in this encounter.    Follow Up Instructions:  I discussed the assessment and treatment plan with the patient. The patient was provided an opportunity to ask questions and all were answered. The patient agreed with the plan and demonstrated an understanding of the instructions.   The patient was advised to call back or seek an in-person evaluation if the symptoms worsen or if the condition fails to improve as anticipated.  I provided 22 minutes of non-face-to-face time during this encounter.  Patient is located at her place of residence during video conference.  Provider is located at her place of residence.  Liane Comber, RN helped to facilitate visit.   Besnik Febus,  NP

## 2018-06-14 ENCOUNTER — Telehealth: Payer: Self-pay | Admitting: Family Medicine

## 2018-07-02 ENCOUNTER — Other Ambulatory Visit: Payer: Self-pay | Admitting: Internal Medicine

## 2018-07-02 DIAGNOSIS — E1165 Type 2 diabetes mellitus with hyperglycemia: Secondary | ICD-10-CM

## 2018-07-12 ENCOUNTER — Telehealth: Payer: Self-pay

## 2018-07-12 NOTE — Telephone Encounter (Signed)
Spoke with the patient I was able to schedule her 3 month follow up with Amy. She has been scheduled for 09/13/2018 at 2:30 pm. Patient is aware of appt day and time.

## 2018-07-24 ENCOUNTER — Encounter: Payer: Self-pay | Admitting: Family Medicine

## 2018-07-24 ENCOUNTER — Ambulatory Visit: Payer: Medicaid Other | Admitting: Family Medicine

## 2018-07-24 ENCOUNTER — Other Ambulatory Visit: Payer: Self-pay

## 2018-07-24 VITALS — BP 145/72 | HR 96 | Ht 62.0 in | Wt 323.0 lb

## 2018-07-24 DIAGNOSIS — M25511 Pain in right shoulder: Secondary | ICD-10-CM

## 2018-07-24 DIAGNOSIS — G8929 Other chronic pain: Secondary | ICD-10-CM | POA: Diagnosis not present

## 2018-07-24 MED ORDER — IBUPROFEN 600 MG PO TABS: 600.0000 mg | ORAL_TABLET | Freq: Three times a day (TID) | ORAL | 1 refills | Status: DC | PRN

## 2018-07-24 NOTE — Progress Notes (Signed)
PCP: Ina Homes, MD  Subjective:   HPI: Patient is a 43 y.o. female here for right shoulder pain.   Patient reports 7.5/10 pain in the right shoulder that is worse with daily activity.  She previously received a steroid injection which provided only about 3 days of relief before the pain returned.  At this time she states that it makes it worse extremely difficult.  She reports pain if laying on her right shoulder.  For some relief, she which is a pillow under her arm.  She uses or is tried multiple topical medications which have largely been unhelpful.  She is not currently taking any p.o. medications.  She denies any recent injuries to the shoulder.  No distal numbness or tingling.  She notes slight hyperpigmentation at the area of her previous subacromial injection.  Past Medical History:  Diagnosis Date  . Acute nonintractable headache 12/26/2017  . Arthritis    major joints  . Asthma    prn inhaler  . Asthma exacerbation 07/25/2013  . Asthma in adult, severe persistent, with acute exacerbation 01/11/2018  . Carpal tunnel syndrome of right wrist 09/2012  . Complication of anesthesia    states is hard to wake up post-op  . Constipation 12/30/2014  . Diabetes mellitus    intolerant to Metformin  . Dry skin    hands   . Family history of anesthesia complication    mother went into coma during c-section  . Genital herpes 01/01/2006   on acyclovir BID   . GERD (gastroesophageal reflux disease)   . Herpes genital   . Hyperglycemia 12/26/2017  . Low back pain 11/22/2012  . Lower extremity edema 10/18/2012  . Menorrhagia 11/06/2015  . Morbid obesity with BMI of 50 to 60 07/23/2009  . Obesity hypoventilation syndrome ([place]) 10/30/2012  . Obesity, morbid ([place]) 01/10/2014  . OSA on CPAP     AHI was on  11-17-12 to 120/hr, titrated to 15 cm with 3 cm EPR/   . PCOS (polycystic ovarian syndrome)   . Rheumatoid arthritis ([place])   . Right ankle pain 06/19/2015  . Right hip pain 09/04/2017  .  Right shoulder pain 06/01/2017  . Seasonal allergies   . Sleep apnea, obstructive    uses CPAP nightly - had sleep study 08/22/2007    Current Outpatient Medications on File Prior to Visit  Medication Sig Dispense Refill  . acyclovir (ZOVIRAX) 400 MG tablet TAKE 1 TABLET BY MOUTH TWICE A DAY 60 tablet 2  . albuterol (PROAIR HFA) 108 (90 Base) MCG/ACT inhaler Inhale 2 puffs into the lungs every 4 (four) hours as needed for wheezing or shortness of breath (cough). (Patient taking differently: Inhale 2 puffs into the lungs every 4 (four) hours as needed for wheezing or shortness of breath (or coughing). ) 6.7 g 2  . atorvastatin (LIPITOR) 40 MG tablet Take 1 tablet (40 mg total) by mouth at bedtime. 90 tablet 1  . benzonatate (TESSALON) 200 MG capsule Take 1 capsule (200 mg total) by mouth 3 (three) times daily as needed for cough. 20 capsule 0  . Blood Glucose Monitoring Suppl (ACCU-CHEK AVIVA PLUS) w/Device KIT Please check you blood sugars 4 times a day 1 kit 0  . dapagliflozin propanediol (FARXIGA) 5 MG TABS tablet Take 5 mg by mouth daily. 90 tablet 1  . diclofenac (VOLTAREN) 75 MG EC tablet Take 1 tablet (75 mg total) by mouth 2 (two) times daily. (Patient taking differently: Take 75 mg by mouth 2 (  two) times daily as needed (for pain or inflammation). ) 60 tablet 1  . glipiZIDE (GLUCOTROL) 10 MG tablet Take 1 tablet (10 mg total) by mouth 2 (two) times daily before a meal. 60 tablet 3  . glucose blood (ACCU-CHEK SMARTVIEW) test strip TEST FOUR TIMES DAILY 150 each 6  . ibuprofen (ADVIL,MOTRIN) 600 MG tablet Take 1 tablet (600 mg total) by mouth every 8 (eight) hours as needed. (Patient taking differently: Take 600 mg by mouth every 8 (eight) hours as needed for headache or mild pain. ) 90 tablet 1  . insulin aspart (NOVOLOG) 100 UNIT/ML FlexPen Inject 15 Units into the skin 3 (three) times daily with meals. 15 mL 11  . Insulin Glargine (LANTUS) 100 UNIT/ML Solostar Pen INJECT 45 UNITS INTO THE  SKIN 2 (TWO) TIMES DAILY. 15 pen 0  . Insulin Pen Needle (B-D UF III MINI PEN NEEDLES) 31G X 5 MM MISC USE AS DIRECTED TO INJECT INSULIN FIVE A DAY E11.65 100 each 6  . JANUVIA 100 MG tablet TAKE 1 TABLET BY MOUTH EVERY DAY 90 tablet 3  . lactulose (CEPHULAC) 10 g packet Take 1 packet (10 g total) by mouth daily as needed. Reported on 07/29/2015 (Patient taking differently: Take 10 g by mouth daily as needed (for constipation). ) 30 each 0  . Lancets (ACCU-CHEK SOFT TOUCH) lancets Use as instructed 100 each 12  . loratadine (CLARITIN) 10 MG tablet Take 1 tablet (10 mg total) by mouth 2 (two) times daily. (Patient taking differently: Take 20 mg by mouth 2 (two) times daily. ) 180 tablet 1  . methocarbamol (ROBAXIN) 500 MG tablet Take 1 tablet (500 mg total) by mouth every 8 (eight) hours as needed. (Patient taking differently: Take 500 mg by mouth every 8 (eight) hours as needed for muscle spasms. ) 60 tablet 1  . mometasone-formoterol (DULERA) 200-5 MCG/ACT AERO Inhale 2 puffs into the lungs 2 (two) times daily. 13 Inhaler 1  . montelukast (SINGULAIR) 10 MG tablet Take 1 tablet (10 mg total) by mouth at bedtime. 90 tablet 1  . nitroGLYCERIN (NITRODUR - DOSED IN MG/24 HR) 0.2 mg/hr patch Apply 1/4th patch to affected achilles, change daily 30 patch 1  . omeprazole (PRILOSEC) 20 MG capsule Take 1 capsule (20 mg total) by mouth daily before breakfast. 90 capsule 2  . Probiotic Product (PROBIOTIC DAILY PO) Take 2 capsules by mouth every evening.     . TURMERIC PO Take 2 capsules by mouth at bedtime.      No current facility-administered medications on file prior to visit.     Past Surgical History:  Procedure Laterality Date  . CARPAL TUNNEL RELEASE Right 10/02/2012   Procedure: RIGHT CARPAL TUNNEL RELEASE ENDOSCOPIC;  Surgeon: Jolyn Nap, MD;  Location: [facility];  Service: Orthopedics;  Laterality: Right;  . CHOLECYSTECTOMY N/A 12/02/2014   Procedure: LAPAROSCOPIC  CHOLECYSTECTOMY;  Surgeon: Armandina Gemma, MD;  Location: [facility];  Service: General;  Laterality: N/A;  . HYSTEROSCOPY W/D&C  01-08-2002    Allergies  Allergen Reactions  . Bee Venom Anaphylaxis and Swelling  . Wasp Venom Anaphylaxis and Swelling  . Citrus Hives and Itching  . Latex Hives and Swelling  . Penicillins Hives and Itching    Has patient had a PCN reaction causing immediate rash, facial/tongue/throat swelling, SOB or lightheadedness with hypotension: Yes Has patient had a PCN reaction causing severe rash involving mucus membranes or skin necrosis: Unk Has patient had a PCN reaction that  required hospitalization: Was already in hosp Has patient had a PCN reaction occurring within the last 10 years: No If all of the above answers are "NO", then may proceed with Cephalosporin use.   . Soap Hives, Itching and Swelling    PUREX LAUNDRY DETERGENT  . Tomato Hives and Itching  . Banana Itching and Nausea Only  . Chocolate Itching  . Fluticasone Other (See Comments)    Per patient, makes sneeze and gag  . Hydrocodone Itching and Nausea Only  . Other Itching    Acidic foods- Itching [place]- Itching in the roof of the mouth  . Shellfish Allergy Other (See Comments)    Patient is uncertain; stated that she tolerates this (??)  . Adhesive [Tape] Itching and Rash    Allergic to  "leads"  . Doxycycline Other (See Comments)    AFFECTS JOINTS  . Metformin And Related Nausea Only, Rash and Other (See Comments)    GI UPSET, also  . Red Dye Itching, Rash and Other (See Comments)    At eye doctor's office, the dilating drops- Skin breaks out, chest turned red, itching, face became swollen    Social History   Socioeconomic History  . Marital status: Divorced    Spouse name: Not on file  . Number of children: 2  . Years of education: 26  . Highest education level: Not on file  Occupational History  . Occupation: UNEMPLOYED    Employer: UNEMPLOYED  Social Needs  . Financial  resource strain: Not on file  . Food insecurity:    Worry: Not on file    Inability: Not on file  . Transportation needs:    Medical: Not on file    Non-medical: Not on file  Tobacco Use  . Smoking status: Former Smoker    Years: 10.00    Last attempt to quit: 05/24/2010    Years since quitting: 8.1  . Smokeless tobacco: Never Used  Substance and Sexual Activity  . Alcohol use: Yes    Alcohol/week: 0.0 standard drinks    Comment: socially  . Drug use: No  . Sexual activity: Yes    Birth control/protection: None    Comment: Lives w/a partner  Lifestyle  . Physical activity:    Days per week: Not on file    Minutes per session: Not on file  . Stress: Not on file  Relationships  . Social connections:    Talks on phone: Not on file    Gets together: Not on file    Attends religious service: Not on file    Active member of club or organization: Not on file    Attends meetings of clubs or organizations: Not on file    Relationship status: Not on file  . Intimate partner violence:    Fear of current or ex partner: Not on file    Emotionally abused: Not on file    Physically abused: Not on file    Forced sexual activity: Not on file  Other Topics Concern  . Not on file  Social History Narrative   Single, but lives with a partner   Has 2 children and cares for an infant during the day          Family History  Problem Relation Age of Onset  . Other Mother   . Leukemia Cousin   . Other Other   . Obesity Other   . Other Other   . Diabetes Maternal Aunt   . Heart attack  Maternal Aunt   . Alzheimer's disease Maternal Uncle   . Cancer Maternal Uncle        prostate  . Diabetes Paternal Aunt   . Cancer Paternal Aunt        brain,mouth  . Alzheimer's disease Paternal Aunt   . Diabetes Paternal Uncle   . Diabetes Maternal Grandmother   . Diabetes Maternal Grandfather     BP (!) 145/72   Pulse 96   Ht 5' 2"  (1.575 m)   Wt (!) 323 lb (146.5 kg)   BMI 59.08 kg/m    Review of Systems: See HPI above.     Objective:  Physical Exam:  Gen: awake, alert, NAD, comfortable in exam room Pulm: breathing unlabored  Right Shoulder: No obvious deformity or asymmetry. No bruising. No swelling Diffuse anterior shoulder pain AROM limited in flexion to 120, abduction to about 90, IR to her back pocket. Normal ER NV intact distally Special Tests:  - Impingement: Positive [place] and [place].  - Supraspinatus: pain with empty can. Strength limited by pain - Infraspinatus/Teres: 5/5 strength with ER with pain - Subscapularis:5/5 strength with IR with pain   Assessment & Plan:  1. Right shoulder pain - worse since last visit and no real benefit from subacromial injection. Her exam does not help to localize her pain, pain is diffuse. xrays show no GH arthritis. Will obtain MRI to further evaluate. Renew ibuprofen. F/u pending MRI.

## 2018-07-24 NOTE — Patient Instructions (Signed)
We will go ahead with an MRI of your shoulder to further assess. Continue with topical medications. I sent ibuprofen in for you as well. Follow up will depend on the MRI.

## 2018-07-25 ENCOUNTER — Encounter: Payer: Self-pay | Admitting: Family Medicine

## 2018-08-01 NOTE — Addendum Note (Signed)
Addended by: Sherrie George F on: 08/01/2018 12:20 PM   Modules accepted: Orders

## 2018-08-04 ENCOUNTER — Other Ambulatory Visit: Payer: Self-pay

## 2018-08-04 ENCOUNTER — Ambulatory Visit
Admission: RE | Admit: 2018-08-04 | Discharge: 2018-08-04 | Disposition: A | Discharge status: Home / Self Care | Payer: Medicaid Other | Source: Ambulatory Visit | Attending: Family Medicine | Admitting: Family Medicine

## 2018-08-04 DIAGNOSIS — M25511 Pain in right shoulder: Secondary | ICD-10-CM

## 2018-08-09 NOTE — Addendum Note (Signed)
Addended by: Sherrie George F on: 08/09/2018 08:11 AM   Modules accepted: Orders

## 2018-08-10 ENCOUNTER — Telehealth: Payer: Self-pay | Admitting: *Deleted

## 2018-08-10 NOTE — Telephone Encounter (Signed)
RETURNED [facility] OFFICE REQUESTING [place] REFERRAL TO DR LANDAU. MESSAGE PER DR HELBERG, PATIENT IS A PATIENT OF [facility]. UNABLE TO AUTHORIIZE REFERRAL FOR THIS PATIENT/

## 2018-08-16 ENCOUNTER — Other Ambulatory Visit: Payer: Medicaid Other

## 2018-08-28 ENCOUNTER — Inpatient Hospital Stay (HOSPITAL_COMMUNITY)
Admission: EM | Admit: 2018-08-28 | Discharge: 2018-09-02 | DRG: 177 | Disposition: A | Discharge status: Home / Self Care | Payer: Medicaid Other | Attending: Internal Medicine | Admitting: Internal Medicine

## 2018-08-28 ENCOUNTER — Other Ambulatory Visit: Payer: Self-pay

## 2018-08-28 ENCOUNTER — Emergency Department (HOSPITAL_COMMUNITY): Payer: Medicaid Other

## 2018-08-28 ENCOUNTER — Encounter (HOSPITAL_COMMUNITY): Payer: Self-pay

## 2018-08-28 DIAGNOSIS — Z806 Family history of leukemia: Secondary | ICD-10-CM

## 2018-08-28 DIAGNOSIS — J4550 Severe persistent asthma, uncomplicated: Secondary | ICD-10-CM | POA: Diagnosis not present

## 2018-08-28 DIAGNOSIS — Z888 Allergy status to other drugs, medicaments and biological substances status: Secondary | ICD-10-CM

## 2018-08-28 DIAGNOSIS — J988 Other specified respiratory disorders: Secondary | ICD-10-CM

## 2018-08-28 DIAGNOSIS — U071 COVID-19: Principal | ICD-10-CM | POA: Diagnosis present

## 2018-08-28 DIAGNOSIS — J45909 Unspecified asthma, uncomplicated: Secondary | ICD-10-CM | POA: Diagnosis present

## 2018-08-28 DIAGNOSIS — E785 Hyperlipidemia, unspecified: Secondary | ICD-10-CM | POA: Diagnosis present

## 2018-08-28 DIAGNOSIS — G4733 Obstructive sleep apnea (adult) (pediatric): Secondary | ICD-10-CM

## 2018-08-28 DIAGNOSIS — Z820 Family history of epilepsy and other diseases of the nervous system: Secondary | ICD-10-CM

## 2018-08-28 DIAGNOSIS — Z87891 Personal history of nicotine dependence: Secondary | ICD-10-CM

## 2018-08-28 DIAGNOSIS — Z91018 Allergy to other foods: Secondary | ICD-10-CM

## 2018-08-28 DIAGNOSIS — Z833 Family history of diabetes mellitus: Secondary | ICD-10-CM

## 2018-08-28 DIAGNOSIS — Z880 Allergy status to penicillin: Secondary | ICD-10-CM

## 2018-08-28 DIAGNOSIS — K219 Gastro-esophageal reflux disease without esophagitis: Secondary | ICD-10-CM | POA: Diagnosis present

## 2018-08-28 DIAGNOSIS — M069 Rheumatoid arthritis, unspecified: Secondary | ICD-10-CM | POA: Diagnosis present

## 2018-08-28 DIAGNOSIS — Z794 Long term (current) use of insulin: Secondary | ICD-10-CM

## 2018-08-28 DIAGNOSIS — Z8249 Family history of ischemic heart disease and other diseases of the circulatory system: Secondary | ICD-10-CM

## 2018-08-28 DIAGNOSIS — E1165 Type 2 diabetes mellitus with hyperglycemia: Secondary | ICD-10-CM | POA: Diagnosis present

## 2018-08-28 DIAGNOSIS — Z91013 Allergy to seafood: Secondary | ICD-10-CM

## 2018-08-28 DIAGNOSIS — Z79899 Other long term (current) drug therapy: Secondary | ICD-10-CM

## 2018-08-28 DIAGNOSIS — Z91040 Latex allergy status: Secondary | ICD-10-CM

## 2018-08-28 DIAGNOSIS — G4730 Sleep apnea, unspecified: Secondary | ICD-10-CM

## 2018-08-28 DIAGNOSIS — G4710 Hypersomnia, unspecified: Secondary | ICD-10-CM | POA: Diagnosis present

## 2018-08-28 DIAGNOSIS — Z91030 Bee allergy status: Secondary | ICD-10-CM

## 2018-08-28 DIAGNOSIS — E662 Morbid (severe) obesity with alveolar hypoventilation: Secondary | ICD-10-CM | POA: Diagnosis present

## 2018-08-28 DIAGNOSIS — J9601 Acute respiratory failure with hypoxia: Secondary | ICD-10-CM | POA: Diagnosis present

## 2018-08-28 DIAGNOSIS — J1282 Pneumonia due to coronavirus disease 2019: Secondary | ICD-10-CM | POA: Diagnosis present

## 2018-08-28 DIAGNOSIS — J1289 Other viral pneumonia: Secondary | ICD-10-CM | POA: Diagnosis present

## 2018-08-28 DIAGNOSIS — Z9989 Dependence on other enabling machines and devices: Secondary | ICD-10-CM

## 2018-08-28 DIAGNOSIS — Z6841 Body Mass Index (BMI) 40.0 and over, adult: Secondary | ICD-10-CM

## 2018-08-28 LAB — URINALYSIS, ROUTINE W REFLEX MICROSCOPIC
Bilirubin Urine: NEGATIVE
Glucose, UA: >=500 mg/dL — AB
Hgb urine dipstick: NEGATIVE
Ketones, ur: NEGATIVE mg/dL
Leukocytes,Ua: NEGATIVE
Nitrite: NEGATIVE
Protein, ur: 30 mg/dL — AB
Specific Gravity, Urine: 1.031 — ABNORMAL HIGH (ref 1.005–1.030)
pH: 5 (ref 5.0–8.0)

## 2018-08-28 LAB — TROPONIN I (HIGH SENSITIVITY): Troponin I (High Sensitivity): 2 ng/L (ref <18)

## 2018-08-28 LAB — CBC WITH DIFFERENTIAL/PLATELET
Abs Immature Granulocytes: 0.03 10*3/uL (ref 0.00–0.07)
Basophils Absolute: 0 10*3/uL (ref 0.0–0.1)
Basophils Relative: 0 %
Eosinophils Absolute: 0 10*3/uL (ref 0.0–0.5)
Eosinophils Relative: 0 %
HCT: 40.9 % (ref 36.0–46.0)
Hemoglobin: 12 g/dL (ref 12.0–15.0)
Immature Granulocytes: 1 %
Lymphocytes Relative: 26 %
Lymphs Abs: 1.3 10*3/uL (ref 0.7–4.0)
MCH: 24.3 pg — ABNORMAL LOW (ref 26.0–34.0)
MCHC: 29.3 g/dL — ABNORMAL LOW (ref 30.0–36.0)
MCV: 83 fL (ref 80.0–100.0)
Monocytes Absolute: 0.2 10*3/uL (ref 0.1–1.0)
Monocytes Relative: 4 %
Neutro Abs: 3.4 10*3/uL (ref 1.7–7.7)
Neutrophils Relative %: 69 %
Platelets: 110 10*3/uL — ABNORMAL LOW (ref 150–400)
RBC: 4.93 MIL/uL (ref 3.87–5.11)
RDW: 16.6 % — ABNORMAL HIGH (ref 11.5–15.5)
WBC: 5 10*3/uL (ref 4.0–10.5)
nRBC: 0 % (ref 0.0–0.2)

## 2018-08-28 LAB — BASIC METABOLIC PANEL
Anion gap: 9 (ref 5–15)
BUN: 9 mg/dL (ref 6–20)
CO2: 25 mmol/L (ref 22–32)
Calcium: 8.3 mg/dL — ABNORMAL LOW (ref 8.9–10.3)
Chloride: 100 mmol/L (ref 98–111)
Creatinine, Ser: 0.61 mg/dL (ref 0.44–1.00)
GFR calc Af Amer: >60 mL/min (ref >60)
GFR calc non Af Amer: >60 mL/min (ref >60)
Glucose, Bld: 208 mg/dL — ABNORMAL HIGH (ref 70–99)
Potassium: 4.3 mmol/L (ref 3.5–5.1)
Sodium: 134 mmol/L — ABNORMAL LOW (ref 135–145)

## 2018-08-28 LAB — SARS CORONAVIRUS 2 BY RT PCR (HOSPITAL ORDER, PERFORMED IN ~~LOC~~ HOSPITAL LAB): SARS Coronavirus 2: POSITIVE — AB

## 2018-08-28 MED ORDER — LEVOFLOXACIN IN D5W 750 MG/150ML IV SOLN
750.0000 mg | Freq: Once | INTRAVENOUS | Status: AC
  Administered 2018-08-29: 750 mg via INTRAVENOUS
  Filled 2018-08-28: qty 150

## 2018-08-28 NOTE — ED Triage Notes (Signed)
Patient reports that she began having SOB x 5 days ago. Patient states went to her PCP on 08/25/18 and was prescribed medication. Patient reports that she was exposed to Covid-19 and her twin sons and her  71rd child were positive.. Patient had a Covid -19 test 3 days ago and has not been told the results. Patient has increased SOB and fatigue.

## 2018-08-28 NOTE — H&P (Signed)
Ana Long GMW:[id number] DOB: 11-Dec-1975 DOA: 08/28/2018     PCP: Willeen Niece, PA   Outpatient Specialists:     Neurology L[place]ax NP   Sports Medicine Patient arrived to ER on 08/28/18 at 1746  Patient coming from: home Lives With family    Chief Complaint:  Chief Complaint  Patient presents with  . Shortness of Breath    HPI: Ana Long is a 43 y.o. female with medical history significant of Asthma, DM2, GERD, Obesity, RA, OSA on CPAP, Herpis    Presented with Shortness of breath for the past 5 days Recently her children have tested positive tested herself 3 days ago but have not heard results yet As of breath has been gradually getting worse for the past 3 to 4 days she has had subjective fevers and dry cough chest pain Her children go to daycare and she feels that they were exposed there. She has been prescribed prednisone and Levaquin by her primary care provider She endorses nausea no diarrhea  Infectious risk factors:  Reports  fever, shortness of breath, dry cough, chest pain, Sore throat,URI symptoms, anosmia/change in taste, N/V/Diarrhea/abdominal pain,  Body aches, severe fatigue sick contacts, known COVID 19 exposure  In  ER RAPID COVID TEST POSITIVE,     Regarding pertinent Chronic problems:    Hyperlipidemia -  on statins Lipitor        DM 2 -  Lab Results  Component Value Date   HGBA1C 13.0 (A) 01/24/2018   on insulin, PO meds      Morbid obesity-   BMI Readings from Last 1 Encounters:  08/28/18 56.52 kg/m       Asthma -well controlled on home inhalers/                           OSA -on nocturnal oxygen, *CPAP, *noncompliant with CPAP     While in ER:  The following Work up has been ordered so far:  Orders Placed This Encounter  Procedures  . SARS Coronavirus 2 (CEPHEID- Performed in [place] hospital lab), [facility]  . DG Chest Port 1 View  . Basic metabolic panel  . CBC with Differential  . Urinalysis,  Routine w reflex microscopic  . C-reactive protein  . D-dimer, quantitative (not at [facility])  . Ferritin  . Fibrinogen  . Interleukin-6, Plasma  . Lactate dehydrogenase  . Procalcitonin  . Sedimentation rate  . Hepatic function panel  . Diet heart healthy/carb modified Room service appropriate? Yes; Fluid consistency: Thin  . Novel Coronavirus PPE supplies (droplet and contact precautions) yellow stethoscopes, surgical mask, gowns, surgical caps, face shield, goggles, CAPR - on the floor/unit, cleaning Sani-Cloth (orange and purple top)  . Place COVID-19 isolation sign and PPE checklist outside the DOOR  . Do not give nonsteroidal anti-inflammatory drugs (NSAIDs)  . Patient to wear surgical mask during transportation  . Place working phone next to the patient  . Initiate Oral Care Protocol  . Initiate Carrier Fluid Protocol  . Cardiac Monitoring - Continuous Indefinite  . Cardiac monitoring  . Consult to hospitalist  ALL PATIENTS BEING ADMITTED/HAVING PROCEDURES NEED COVID-19 SCREENING  . pharmacy consult  . Airborne and Contact precautions  . EKG 12-Lead  . Insert saline lock  . Place in observation (patient's expected length of stay will be less than 2 midnights)    Following Medications were ordered in ER: Medications  levofloxacin (LEVAQUIN) IVPB 750  mg (750 mg Intravenous New Bag/Given 08/29/18 0008)  predniSONE (DELTASONE) tablet 40 mg (has no administration in time range)        Consult Orders  (From admission, onward)         Start     Ordered   08/28/18 2318  Consult to hospitalist  ALL PATIENTS BEING ADMITTED/HAVING PROCEDURES NEED COVID-19 SCREENING  Once    Comments: ALL PATIENTS BEING ADMITTED/HAVING PROCEDURES NEED COVID-19 SCREENING  Provider:  (Not yet assigned)  Question Answer Comment  Place call to: [facility]   Reason for Consult Admit      08/28/18 2317           Significant initial  Findings: Abnormal Labs Reviewed  SARS CORONAVIRUS 2  ([facility]) - Abnormal; Notable for the following components:      Result Value   SARS Coronavirus 2 POSITIVE (*)    All other components within normal limits  BASIC METABOLIC PANEL - Abnormal; Notable for the following components:   Sodium 134 (*)    Glucose, Bld 208 (*)    Calcium 8.3 (*)    All other components within normal limits  CBC WITH DIFFERENTIAL/PLATELET - Abnormal; Notable for the following components:   MCH 24.3 (*)    MCHC 29.3 (*)    RDW 16.6 (*)    Platelets 110 (*)    All other components within normal limits  URINALYSIS, ROUTINE W REFLEX MICROSCOPIC - Abnormal; Notable for the following components:   APPearance HAZY (*)    Specific Gravity, Urine 1.031 (*)    Glucose, UA >=500 (*)    Protein, ur 30 (*)    Bacteria, UA RARE (*)    All other components within normal limits     Otherwise labs showing:    Recent Labs  Lab 08/28/18 2003  NA 134*  K 4.3  CO2 25  GLUCOSE 208*  BUN 9  CREATININE 0.61  CALCIUM 8.3*    Cr  stable,   Lab Results  Component Value Date   CREATININE 0.61 08/28/2018   CREATININE 0.90 01/13/2018   CREATININE 0.78 01/12/2018    No results for input(s): AST, ALT, ALKPHOS, BILITOT, PROT, ALBUMIN in the last 168 hours. Lab Results  Component Value Date   CALCIUM 8.3 (L) 08/28/2018      WBC      Component Value Date/Time   WBC 5.0 08/28/2018 2003   ANC    Component Value Date/Time   NEUTROABS 3.4 08/28/2018 2003  ALC 1.3   Plt: Lab Results  Component Value Date   PLT 110 (L) 08/28/2018     COVID-19 Labs    Lab Results  Component Value Date   SARSCOV2NAA POSITIVE (A) 08/28/2018     HG/HCT  stable,       Component Value Date/Time   HGB 12.0 08/28/2018 2003   HGB 12.2 11/16/2017 1743   HCT 40.9 08/28/2018 2003   HCT 39.0 11/16/2017 1743     Troponin  High sensitivity  2      BNP (last 3 results) Recent Labs    01/11/18 1435  BNP 15.7     DM  labs:   HbA1C: Recent Labs    10/13/17 1514 01/24/18 1127  HGBA1C 11.5* 13.0*     CBG (last 3)  No results for input(s): GLUCAP in the last 72 hours.     UA   no evidence of UTI  Urine analysis:    Component Value Date/Time   COLORURINE YELLOW 08/28/2018 2038   APPEARANCEUR HAZY (A) 08/28/2018 2038   LABSPEC 1.031 (H) 08/28/2018 2038   PHURINE 5.0 08/28/2018 2038   GLUCOSEU >=500 (A) 08/28/2018 2038   GLUCOSEU > 1000 mg/dL (A) 02/24/2010 0313   HGBUR NEGATIVE 08/28/2018 2038   BILIRUBINUR NEGATIVE 08/28/2018 2038   BILIRUBINUR Negative 12/26/2017 [place] 08/28/2018 2038   PROTEINUR 30 (A) 08/28/2018 2038   UROBILINOGEN 1.0 12/26/2017 1518   UROBILINOGEN 1.0 02/12/2015 1419   NITRITE NEGATIVE 08/28/2018 2038   LEUKOCYTESUR NEGATIVE 08/28/2018 2038      CXR - multifocal infiltrates   ECG:  Personally reviewed by me showing: HR : 99 Rhythm:  NSR,     no evidence of ischemic changes QTC 389      ED Triage Vitals  Enc Vitals Group     BP 08/28/18 1749 (!) 145/74     Pulse Rate 08/28/18 1749 (!) 102     Resp 08/28/18 1749 20     Temp 08/28/18 1749 99.2 F (37.3 C)     Temp Source 08/28/18 1749 Oral     SpO2 08/28/18 1749 95 %     Weight 08/28/18 1756 (!) 309 lb (140.2 kg)     Height 08/28/18 1756 5' 2"  (1.575 m)     Head Circumference --      Peak Flow --      Pain Score 08/28/18 1756 0     Pain Loc --      Pain Edu? --      Excl. in [place]? --   TMAX(24)@       Latest  Blood pressure (!) 148/100, pulse 98, temperature 99.2 F (37.3 C), temperature source Oral, resp. rate (!) 29, height 5' 2"  (1.575 m), weight (!) 140.2 kg, last menstrual period 07/25/2018, SpO2 95 %. Down to 89% with cough     Hospitalist was called for admission for COVID 19 PNA   Review of Systems:    Pertinent positives include: Fevers, chills, fatigue, shortness of breath at rest.   dyspnea on exertio  nausea,  Constitutional:  No weight loss, night sweats,   weight loss  HEENT:  No headaches, Difficulty swallowing,Tooth/dental problems,Sore throat,  No sneezing, itching, ear ache, nasal congestion, post nasal drip,  Cardio-vascular:  No chest pain, Orthopnea, PND, anasarca, dizziness, palpitations.no Bilateral lower extremity swelling  GI:  No heartburn, indigestion, abdominal pain,vomiting, diarrhea, change in bowel habits, loss of appetite, melena, blood in stool, hematemesis Resp:  non, No excess mucus, no productive cough, No non-productive cough, No coughing up of blood.No change in color of mucus.No wheezing. Skin:  no rash or lesions. No jaundice GU:  no dysuria, change in color of urine, no urgency or frequency. No straining to urinate.  No flank pain.  Musculoskeletal:  No joint pain or no joint swelling. No decreased range of motion. No back pain.  Psych:  No change in mood or affect. No depression or anxiety. No memory loss.  Neuro: no localizing neurological complaints, no tingling, no weakness, no double vision, no gait abnormality, no slurred speech, no confusion  All systems reviewed and apart from [place] all are negative  Past Medical History:   Past Medical History:  Diagnosis Date  . Acute nonintractable headache 12/26/2017  . Arthritis    major joints  . Asthma    prn inhaler  . Asthma exacerbation 07/25/2013  . Asthma in adult, severe  persistent, with acute exacerbation 01/11/2018  . Carpal tunnel syndrome of right wrist 09/2012  . Complication of anesthesia    states is hard to wake up post-op  . Constipation 12/30/2014  . Diabetes mellitus    intolerant to Metformin  . Dry skin    hands   . Family history of anesthesia complication    mother went into coma during c-section  . Genital herpes 01/01/2006   on acyclovir BID   . GERD (gastroesophageal reflux disease)   . Herpes genital   . Hyperglycemia 12/26/2017  . Low back pain 11/22/2012  . Lower extremity edema 10/18/2012  . Menorrhagia 11/06/2015  . Morbid  obesity with BMI of 50 to 60 07/23/2009  . Obesity hypoventilation syndrome ([place]) 10/30/2012  . Obesity, morbid ([place]) 01/10/2014  . OSA on CPAP     AHI was on  11-17-12 to 120/hr, titrated to 15 cm with 3 cm EPR/   . PCOS (polycystic ovarian syndrome)   . Rheumatoid arthritis ([place])   . Right ankle pain 06/19/2015  . Right hip pain 09/04/2017  . Right shoulder pain 06/01/2017  . Seasonal allergies   . Sleep apnea, obstructive    uses CPAP nightly - had sleep study 08/22/2007      Past Surgical History:  Procedure Laterality Date  . CARPAL TUNNEL RELEASE Right 10/02/2012   Procedure: RIGHT CARPAL TUNNEL RELEASE ENDOSCOPIC;  Surgeon: Jolyn Nap, MD;  Location: [place] [facility];  Service: Orthopedics;  Laterality: Right;  . CHOLECYSTECTOMY N/A 12/02/2014   Procedure: LAPAROSCOPIC CHOLECYSTECTOMY;  Surgeon: Armandina Gemma, MD;  Location: [facility];  Service: General;  Laterality: N/A;  . HYSTEROSCOPY W/D&C  01-08-2002    Social History:  Ambulatory   independently       reports that she quit smoking about 8 years ago. She quit after 10.00 years of use. She has never used smokeless tobacco. She reports current alcohol use. She reports that she does not use drugs.   Family History:   Family History  Problem Relation Age of Onset  . Other Mother   . Leukemia Cousin   . Other Other   . Obesity Other   . Other Other   . Diabetes Maternal Aunt   . Heart attack Maternal Aunt   . Alzheimer's disease Maternal Uncle   . Cancer Maternal Uncle        prostate  . Diabetes Paternal Aunt   . Cancer Paternal Aunt        brain,mouth  . Alzheimer's disease Paternal Aunt   . Diabetes Paternal Uncle   . Diabetes Maternal Grandmother   . Diabetes Maternal Grandfather     Allergies: Allergies  Allergen Reactions  . Bee Venom Anaphylaxis and Swelling  . Wasp Venom Anaphylaxis and Swelling  . Citrus Hives and Itching  . Latex Hives and Swelling  . Penicillins Hives and Itching    Has  patient had a PCN reaction causing immediate rash, facial/tongue/throat swelling, SOB or lightheadedness with hypotension: Yes Has patient had a PCN reaction causing severe rash involving mucus membranes or skin necrosis: Unk Has patient had a PCN reaction that required hospitalization: Was already in hosp Has patient had a PCN reaction occurring within the last 10 years: No If all of the above answers are "NO", then may proceed with Cephalosporin use.   . Soap Hives, Itching and Swelling    PUREX LAUNDRY DETERGENT  . Tomato Hives and Itching  . Banana Itching and Nausea Only  .  Chocolate Itching  . Fluticasone Other (See Comments)    Per patient, makes sneeze and gag  . Hydrocodone Itching and Nausea Only  . Other Itching    Acidic foods- Itching [place]- Itching in the roof of the mouth  . Shellfish Allergy Other (See Comments)    Patient is uncertain; stated that she tolerates this (??)  . Adhesive [Tape] Itching and Rash    Allergic to  "leads"  . Doxycycline Other (See Comments)    AFFECTS JOINTS  . Metformin And Related Nausea Only, Rash and Other (See Comments)    GI UPSET, also  . Red Dye Itching, Rash and Other (See Comments)    At eye doctor's office, the dilating drops- Skin breaks out, chest turned red, itching, face became swollen     Prior to Admission medications   Medication Sig Start Date End Date Taking? Authorizing Provider  acyclovir (ZOVIRAX) 400 MG tablet TAKE 1 TABLET BY MOUTH TWICE A DAY 03/06/18  Yes Helberg, Larkin Ina, MD  albuterol (PROAIR HFA) 108 (90 Base) MCG/ACT inhaler Inhale 2 puffs into the lungs every 4 (four) hours as needed for wheezing or shortness of breath (cough). Patient taking differently: Inhale 2 puffs into the lungs every 4 (four) hours as needed for wheezing or shortness of breath (or coughing).  05/24/16  Yes Holley Raring, MD  atorvastatin (LIPITOR) 40 MG tablet Take 1 tablet (40 mg total) by mouth at bedtime. 02/07/18  Yes Helberg,  Larkin Ina, MD  dapagliflozin propanediol (FARXIGA) 5 MG TABS tablet Take 5 mg by mouth daily. Patient taking differently: Take 5 mg by mouth 2 (two) times a day.  02/07/18  Yes Helberg, Larkin Ina, MD  glipiZIDE (GLUCOTROL) 10 MG tablet Take 1 tablet (10 mg total) by mouth 2 (two) times daily before a meal. 09/05/17  Yes Sid Falcon, MD  insulin aspart (NOVOLOG) 100 UNIT/ML FlexPen Inject 15 Units into the skin 3 (three) times daily with meals. 01/24/18  Yes Helberg, Larkin Ina, MD  Insulin Glargine (LANTUS) 100 UNIT/ML Solostar Pen INJECT 45 UNITS INTO THE SKIN 2 (TWO) TIMES DAILY. Patient taking differently: Inject 50 Units into the skin 2 (two) times daily.  04/18/18  Yes Helberg, Larkin Ina, MD  JANUVIA 100 MG tablet TAKE 1 TABLET BY MOUTH EVERY DAY 03/01/18  Yes Helberg, Larkin Ina, MD  mometasone-formoterol (DULERA) 200-5 MCG/ACT AERO Inhale 2 puffs into the lungs 2 (two) times daily. 03/01/18  Yes Helberg, Larkin Ina, MD  montelukast (SINGULAIR) 10 MG tablet Take 1 tablet (10 mg total) by mouth at bedtime. 01/15/18  Yes Helberg, Larkin Ina, MD  omeprazole (PRILOSEC) 20 MG capsule Take 1 capsule (20 mg total) by mouth daily before breakfast. 09/05/17  Yes Sid Falcon, MD  benzonatate (TESSALON) 200 MG capsule Take 1 capsule (200 mg total) by mouth 3 (three) times daily as needed for cough. Patient not taking: Reported on 08/28/2018 01/15/18   Ina Homes, MD  Blood Glucose Monitoring Suppl (ACCU-CHEK AVIVA PLUS) w/Device KIT Please check you blood sugars 4 times a day 01/15/18   Ina Homes, MD  diclofenac (VOLTAREN) 75 MG EC tablet Take 1 tablet (75 mg total) by mouth 2 (two) times daily. Patient taking differently: Take 75 mg by mouth 2 (two) times daily as needed (for pain or inflammation).  05/30/17   Hudnall, Sharyn Lull, MD  furosemide (LASIX) 20 MG tablet Take 20 mg by mouth daily as needed for fluid or edema.  07/27/18   [provider]  glucose blood (ACCU-CHEK SMARTVIEW) test  strip TEST FOUR TIMES DAILY  01/15/18   Ina Homes, MD  ibuprofen (ADVIL) 600 MG tablet Take 1 tablet (600 mg total) by mouth every 8 (eight) hours as needed. Patient taking differently: Take 600 mg by mouth every 8 (eight) hours as needed for moderate pain.  07/24/18   Hudnall, Sharyn Lull, MD  Insulin Pen Needle (B-D UF III MINI PEN NEEDLES) 31G X 5 MM MISC USE AS DIRECTED TO INJECT INSULIN FIVE A DAY E11.65 01/24/18   Helberg, Larkin Ina, MD  lactulose (CEPHULAC) 10 g packet Take 1 packet (10 g total) by mouth daily as needed. Reported on 07/29/2015 Patient taking differently: Take 10 g by mouth daily as needed (for constipation).  09/05/17   Sid Falcon, MD  Lancets (ACCU-CHEK SOFT [facility]) lancets Use as instructed 01/15/18   Ina Homes, MD  loratadine (CLARITIN) 10 MG tablet Take 1 tablet (10 mg total) by mouth 2 (two) times daily. Patient taking differently: Take 20 mg by mouth daily as needed for allergies.  01/27/18   Ina Homes, MD  methocarbamol (ROBAXIN) 500 MG tablet Take 1 tablet (500 mg total) by mouth every 8 (eight) hours as needed. Patient taking differently: Take 500 mg by mouth every 8 (eight) hours as needed for muscle spasms.  10/20/17   Dene Gentry, MD  nitroGLYCERIN (NITRODUR - DOSED IN MG/24 HR) 0.2 mg/hr patch Apply 1/4th patch to affected achilles, change daily 07/21/17   Hudnall, Sharyn Lull, MD   Physical Exam: Blood pressure (!) 148/100, pulse 98, temperature 99.2 F (37.3 C), temperature source Oral, resp. rate (!) 29, height 5' 2"  (1.575 m), weight (!) 140.2 kg, last menstrual period 07/25/2018, SpO2 95 %. 1. General:  in No  Acute distress   Chronically ill  -appearing 2. Psychological: Alert and   Oriented 3. Head/ENT:  Dry Mucous Membranes                          Head Non traumatic, neck supple                            Poor Dentition 4. SKIN:   decreased Skin turgor,  Skin clean Dry and intact no rash 5. Heart: Regular rate and rhythm no Murmur, no Rub or gallop 6. Lungs:   no  wheezes or crackles  distant 7. Abdomen: Soft,  LUQ tender, Non distended  Morbidly  obese  bowel sounds present 8. Lower extremities: no clubbing, cyanosis, no edema 9. Neurologically Grossly intact, moving all 4 extremities equally   10. MSK: Normal range of motion   All other LABS:     Recent Labs  Lab 08/28/18 2003  WBC 5.0  NEUTROABS 3.4  HGB 12.0  HCT 40.9  MCV 83.0  PLT 110*     Recent Labs  Lab 08/28/18 2003  NA 134*  K 4.3  CL 100  CO2 25  GLUCOSE 208*  BUN 9  CREATININE 0.61  CALCIUM 8.3*     No results for input(s): AST, ALT, ALKPHOS, BILITOT, PROT, ALBUMIN in the last 168 hours.     Cultures:    Component Value Date/Time   SDES URINE, CLEAN CATCH 01/11/2018 1658   SPECREQUEST  01/11/2018 1658    NONE Performed at [facility], [place] [address] [place], [place] [address]    CULT MULTIPLE SPECIES PRESENT, SUGGEST RECOLLECTION (A) 01/11/2018 1658   REPTSTATUS 01/12/2018  FINAL 01/11/2018 1658     Radiological Exams on Admission: Dg Chest Port 1 View  Result Date: 08/28/2018 CLINICAL DATA:  Chest pain.  Fever. EXAM: PORTABLE CHEST 1 VIEW COMPARISON:  September 29, 2017 FINDINGS: The heart size is mildly enlarged. There are multifocal airspace opacities bilaterally. No pneumothorax. No large pleural effusion, however there is elevation of the right hemidiaphragm which limits detection of small pleural effusions. There is no acute osseous abnormality. IMPRESSION: Low lung volumes with multifocal airspace opacities concerning for multifocal pneumonia (viral or bacterial) or developing pulmonary edema. Electronically Signed   By: Constance Holster M.D.   On: 08/28/2018 19:50    Chart has been reviewed    Assessment/Plan  43 y.o. female with medical history significant of Asthma, DM2, GERD, Obesity, RA, OSA on CPAP, Herpis  Admitted for COVID PNA  Present on Admission: . Pneumonia due to COVID-19 virus -   Initial Rapid Novel Corona Virus testing:   Ordered 08/29/18 and is  positive    - As per hx pt with evidence of fever  Cough  chills,  shortness of breath GI symptoms:  Severe fatigue   -   With known exposure to sick contacts area   Following concerning LAB/ imaging findings:    CXR: hazy bilateral peripheral opacities        -Following work-up initiated:        sputum cultures  Ordered 08/29/18, Blood cultures  Ordered 08/29/18,   Following complications noted:     will be monitoring for evidence of other complications such as PE/DVT CVA or  cardiovascular events  Plan of treatment: - Transfer to [facility] facility if positive and beds are available    - Hypoxia now on 2 L  initiate steroids prednisone 21m  BID for now And pharmacy consult for Remdesivir  - Will follow daily d.dimer - Assess for ability to prone  - Supportive management -Fluid sparing resuscitation  -Provide oxygen as needed currently on RA SpO2: 98 % O2 Flow Rate (L/min): 2 L/min - IF d.dimer elvated >5 will increase dose of lovenox -pt have had 3 days of ABX hold off on Levaquin for now Await results of pro calcitonin and evaluate for nay evidence of bacteria superinfection - Consult PCCM if becomes respiratory unstable   Poor Prognostic factors  43y.o.  Personal hx of     obesity immunocompromised state    Will order Airborne and Contact precautions      . Uncontrolled type 2 diabetes mellitus with hyperglycemia (HCC) -  - Order Sensitive  SSI   - continue home insulin regimen   -  check TSH and HgA1C  - Hold by mouth medications    . Hyperlipidemia - stable continue home medications . GERD - stable contiue home meds . Asthma -continue albuterol as needed  Prednisone 40 mg BID  For possible concurrent   asthma exacerbation/ COVID with hypoxia . Hypersomnia with sleep apnea -on CPAP but will have to hold off for now given COVID 19 defer to day team if able to continue    Other plan as per orders.  DVT prophylaxis:   Lovenox      Code Status:  FULL CODE   as per patient   I had personally discussed CODE STATUS with patient    Family Communication:   Family not at  Bedside     Disposition Plan:     To home once workup is complete and patient is stable  Consults called: none  Admission status:  ED Disposition    ED Disposition Condition [place]: [facility] [100101]  Level of Care: Telemetry [5]  Covid Evaluation: Confirmed COVID Positive  Diagnosis: Pneumonia due to COVID-19 virus [[id number]]  Admitting Physician: Toy Baker [[id number]]  Attending Physician: Toy Baker [[id number]]  PT Class (Do Not Modify): Observation [104]  PT Acc Code (Do Not Modify): Observation [10022]         Obs    Level of care     tele   please discontinue once patient no longer qualifies  Precautions:   airborne Airborne and Contact precautions  PPE: Used by the provider:   P100  eye Goggles,  Gloves  gown    Mckenzi Buonomo 08/29/2018, 12:43 AM    [facility]     after 2 AM please page floor coverage PA If 7AM-7PM, please contact the day team taking care of the patient using [email or web address]

## 2018-08-28 NOTE — ED Provider Notes (Signed)
[facility] DEPT Provider Note   CSN: [id number] Arrival date & time: 08/28/18  1746    History   Chief Complaint Chief Complaint  Patient presents with  . Shortness of Breath    HPI Ana Long is a 43 y.o. female with hx asthma, diabetes, GERD, and OSA who presents to the ED today complaining of gradual onset, constant, worsening, shortness of breath x 3-4 days. Pt also complains of subjective fevers, dry cough, and chest pain. She reports that 3 of her children tested positive for covid 19 1.5 weeks ago after being exposed to someone at their daycare. 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 She reports the next day she began having symptoms; she states the children that live in her home and herself were tested on Saturday but have not received the test results. Pt also had a telemedicine visit with her PCP on 07/03 regarding her symptoms and was prescribed Prednisone and Levaquin in case patient as having an asthma exacerbation vs pneumonia; no chest xray was obtained. Pt states her chest pain began only with coughing but has been present constantly for the past 2 days. She also endorses nausea with it. No hx of MI in the past and pt unable to say whether she has FHx of CAD. No recent prolonged travel or immobilization. No hx DVT/PE. No hemoptysis. No estrogen therapy. Denies chills, leg swelling, vomiting, diarrhea, or any other associated symptoms.        Past Medical History:  Diagnosis Date  . Acute nonintractable headache 12/26/2017  . Arthritis    major joints  . Asthma    prn inhaler  . Asthma exacerbation 07/25/2013  . Asthma in adult, severe persistent, with acute exacerbation 01/11/2018  . Carpal tunnel syndrome of right wrist 09/2012  . Complication of anesthesia    states is hard to wake up post-op  . Constipation 12/30/2014  . Diabetes mellitus    intolerant to Metformin  . Dry skin    hands   . Family history of anesthesia complication    mother went into coma during c-section  . Genital herpes 01/01/2006   on acyclovir BID   . GERD (gastroesophageal reflux disease)   . Herpes genital   . Hyperglycemia 12/26/2017  . Low back pain 11/22/2012  . Lower extremity edema 10/18/2012  . Menorrhagia 11/06/2015  . Morbid obesity with BMI of 50 to 60 07/23/2009  . Obesity hypoventilation syndrome ([place]) 10/30/2012  . Obesity, morbid ([place]) 01/10/2014  . OSA on CPAP     AHI was on  11-17-12 to 120/hr, titrated to 15 cm with 3 cm EPR/   . PCOS (polycystic ovarian syndrome)   . Rheumatoid arthritis ([place])   . Right ankle pain 06/19/2015  . Right hip pain 09/04/2017  . Right shoulder pain 06/01/2017  . Seasonal allergies   . Sleep apnea, obstructive    uses CPAP nightly - had sleep study 08/22/2007    Patient Active Problem List   Diagnosis Date Noted  . Pneumonia due to COVID-19 virus 08/28/2018  . Acute and chr resp failure, unsp w hypoxia or hypercapnia ([place]) 04/04/2018  . Mild episode of recurrent major depressive disorder ([place]) 10/13/2017  . Achilles tendinitis of both lower extremities 07/24/2017  . 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 Second degree uterine prolapse 02/12/2015  . Dyspareunia in female 01/24/2015  . Urinary retention 12/09/2014  . 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  Seasonal allergies 01/17/2013  . OSA on CPAP   . Obesity hypoventilation syndrome ([place]) 10/30/2012  . DUB (dysfunctional uterine bleeding) 03/01/2012  . Hypersomnia with sleep apnea   . Asthma 07/06/2011  . Osteoarthritis, knee 02/03/2010  . Morbid obesity with BMI of 50 to 60 07/23/2009  . Uncontrolled type 2 diabetes mellitus with hyperglycemia ([place]) 01/01/2006  . Hyperlipidemia 01/01/2006  . GERD 01/01/2006    Past Surgical History:  Procedure Laterality Date  . CARPAL TUNNEL RELEASE Right 10/02/2012   Procedure: RIGHT CARPAL TUNNEL RELEASE ENDOSCOPIC;  Surgeon: Jolyn Nap, MD;  Location: [facility];  Service: Orthopedics;  Laterality: Right;  . CHOLECYSTECTOMY N/A 12/02/2014   Procedure: LAPAROSCOPIC CHOLECYSTECTOMY;  Surgeon: Armandina Gemma, MD;  Location: [facility];  Service: General;  Laterality: N/A;  . HYSTEROSCOPY W/D&C  01-08-2002     OB History    Gravida  0   Para  0   Term  0   Preterm  0   AB  0   Living  0     SAB  0   TAB  0   Ectopic  0   Multiple  0   Live Births  0            Home Medications    Prior to Admission medications   Medication Sig Start Date End Date Taking? Authorizing Provider  acyclovir (ZOVIRAX) 400 MG tablet TAKE 1 TABLET BY MOUTH TWICE A DAY 03/06/18  Yes Helberg, Larkin Ina, MD  albuterol (PROAIR HFA) 108 (90 Base) MCG/ACT inhaler Inhale 2 puffs into the lungs every 4 (four) hours as needed for wheezing or shortness of breath (cough). Patient taking differently: Inhale 2 puffs into the lungs every 4 (four) hours as needed for wheezing or shortness of breath (or coughing).  05/24/16  Yes Holley Raring, MD  atorvastatin (LIPITOR) 40 MG tablet Take 1 tablet (40 mg total) by mouth at bedtime. 02/07/18  Yes Helberg, Larkin Ina, MD  dapagliflozin propanediol (FARXIGA) 5 MG TABS tablet Take 5 mg by mouth daily. Patient taking differently: Take 5 mg by mouth 2 (two) times a day.  02/07/18  Yes Helberg, Larkin Ina, MD  glipiZIDE (GLUCOTROL) 10 MG tablet Take 1 tablet (10 mg total) by mouth 2 (two) times daily before a meal. 09/05/17  Yes Sid Falcon, MD  insulin aspart (NOVOLOG) 100 UNIT/ML FlexPen Inject 15 Units into the skin 3 (three) times daily with meals. 01/24/18  Yes Helberg, Larkin Ina, MD  Insulin Glargine (LANTUS) 100 UNIT/ML Solostar Pen INJECT 45 UNITS INTO THE SKIN 2 (TWO) TIMES DAILY. Patient taking differently: Inject 50 Units into the skin 2 (two) times daily.  04/18/18  Yes Helberg, Larkin Ina, MD  JANUVIA 100 MG tablet TAKE 1 TABLET BY MOUTH EVERY DAY 03/01/18  Yes Helberg, Larkin Ina, MD   mometasone-formoterol (DULERA) 200-5 MCG/ACT AERO Inhale 2 puffs into the lungs 2 (two) times daily. 03/01/18  Yes Helberg, Larkin Ina, MD  montelukast (SINGULAIR) 10 MG tablet Take 1 tablet (10 mg total) by mouth at bedtime. 01/15/18  Yes Helberg, Larkin Ina, MD  omeprazole (PRILOSEC) 20 MG capsule Take 1 capsule (20 mg total) by mouth daily before breakfast. 09/05/17  Yes Sid Falcon, MD  benzonatate (TESSALON) 200 MG capsule Take 1 capsule (200 mg total) by mouth 3 (three) times daily as needed for cough. Patient not taking: Reported on 08/28/2018 01/15/18   Ina Homes, MD  Blood Glucose Monitoring Suppl (ACCU-CHEK AVIVA PLUS) w/Device KIT Please check you blood  sugars 4 times a day 01/15/18   Ina Homes, MD  diclofenac (VOLTAREN) 75 MG EC tablet Take 1 tablet (75 mg total) by mouth 2 (two) times daily. Patient taking differently: Take 75 mg by mouth 2 (two) times daily as needed (for pain or inflammation).  05/30/17   Hudnall, Sharyn Lull, MD  furosemide (LASIX) 20 MG tablet Take 20 mg by mouth daily as needed for fluid or edema.  07/27/18   [provider]  glucose blood (ACCU-CHEK SMARTVIEW) test strip TEST FOUR TIMES DAILY 01/15/18   Ina Homes, MD  ibuprofen (ADVIL) 600 MG tablet Take 1 tablet (600 mg total) by mouth every 8 (eight) hours as needed. Patient taking differently: Take 600 mg by mouth every 8 (eight) hours as needed for moderate pain.  07/24/18   Hudnall, Sharyn Lull, MD  Insulin Pen Needle (B-D UF III MINI PEN NEEDLES) 31G X 5 MM MISC USE AS DIRECTED TO INJECT INSULIN FIVE A DAY E11.65 01/24/18   Helberg, Larkin Ina, MD  lactulose (CEPHULAC) 10 g packet Take 1 packet (10 g total) by mouth daily as needed. Reported on 07/29/2015 Patient taking differently: Take 10 g by mouth daily as needed (for constipation).  09/05/17   Sid Falcon, MD  Lancets (ACCU-CHEK SOFT [facility]) lancets Use as instructed 01/15/18   Ina Homes, MD  loratadine (CLARITIN) 10 MG tablet Take 1 tablet (10 mg  total) by mouth 2 (two) times daily. Patient taking differently: Take 20 mg by mouth daily as needed for allergies.  01/27/18   Ina Homes, MD  methocarbamol (ROBAXIN) 500 MG tablet Take 1 tablet (500 mg total) by mouth every 8 (eight) hours as needed. Patient taking differently: Take 500 mg by mouth every 8 (eight) hours as needed for muscle spasms.  10/20/17   Dene Gentry, MD  nitroGLYCERIN (NITRODUR - DOSED IN MG/24 HR) 0.2 mg/hr patch Apply 1/4th patch to affected achilles, change daily 07/21/17   Hudnall, Sharyn Lull, MD    Family History Family History  Problem Relation Age of Onset  . Other Mother   . Leukemia Cousin   . Other Other   . Obesity Other   . Other Other   . Diabetes Maternal Aunt   . Heart attack Maternal Aunt   . Alzheimer's disease Maternal Uncle   . Cancer Maternal Uncle        prostate  . Diabetes Paternal Aunt   . Cancer Paternal Aunt        brain,mouth  . Alzheimer's disease Paternal Aunt   . Diabetes Paternal Uncle   . Diabetes Maternal Grandmother   . Diabetes Maternal Grandfather     Social History Social History   Tobacco Use  . Smoking status: Former Smoker    Years: 10.00    Quit date: 05/24/2010    Years since quitting: 8.2  . Smokeless tobacco: Never Used  Substance Use Topics  . Alcohol use: Yes    Alcohol/week: 0.0 standard drinks    Comment: socially  . Drug use: No     Allergies   Bee venom, Wasp venom, Citrus, Latex, Penicillins, Soap, Tomato, Banana, Chocolate, Fluticasone, Hydrocodone, Other, Shellfish allergy, Adhesive [tape], Doxycycline, Metformin and related, and Red dye   Review of Systems Review of Systems  Constitutional: Positive for fever. Negative for chills.  HENT: Negative for congestion.   Eyes: Negative for visual disturbance.  Respiratory: Positive for cough and shortness of breath.   Cardiovascular: Positive for chest pain. Negative for  palpitations and leg swelling.  Gastrointestinal: Positive for  nausea. Negative for abdominal pain, constipation, diarrhea and vomiting.  Genitourinary: Negative for difficulty urinating.  Musculoskeletal: Negative for myalgias.  Skin: Negative for rash.  Neurological: Negative for headaches.     Physical Exam Updated Vital Signs BP (!) 145/74 (BP Location: Right Arm)   Pulse (!) 102   Temp 99.2 F (37.3 C) (Oral)   Resp 20   Ht _0  (1.575 m)   Wt (!) 140.2 kg   LMP 07/25/2018   SpO2 95%   BMI 56.52 kg/m   Physical Exam Vitals signs and nursing note reviewed.  Constitutional:      Appearance: She is not ill-appearing.  HENT:     Head: Normocephalic and atraumatic.  Eyes:     Conjunctiva/sclera: Conjunctivae normal.  Neck:     Musculoskeletal: Neck supple.  Cardiovascular:     Rate and Rhythm: Normal rate and regular rhythm.     Pulses: Normal pulses.  Pulmonary:     Effort: Prolonged expiration present.     Breath sounds: No decreased breath sounds, wheezing, rhonchi or rales.     Comments: O2 saturation 95% Chest:     Chest wall: No tenderness.  Abdominal:     Palpations: Abdomen is soft.     Tenderness: There is no abdominal tenderness.  Musculoskeletal:     Right lower leg: No edema.     Left lower leg: No edema.  Skin:    General: Skin is warm and dry.  Neurological:     Mental Status: She is alert.      ED Treatments / Results  Labs (all labs ordered are listed, but only abnormal results are displayed) Labs Reviewed  SARS CORONAVIRUS 2 ([facility]) - Abnormal; Notable for the following components:      Result Value   SARS Coronavirus 2 POSITIVE (*)    All other components within normal limits  BASIC METABOLIC PANEL - Abnormal; Notable for the following components:   Sodium 134 (*)    Glucose, Bld 208 (*)    Calcium 8.3 (*)    All other components within normal limits  CBC WITH DIFFERENTIAL/PLATELET - Abnormal; Notable for the following components:   MCH 24.3  (*)    MCHC 29.3 (*)    RDW 16.6 (*)    Platelets 110 (*)    All other components within normal limits  URINALYSIS, ROUTINE W REFLEX MICROSCOPIC - Abnormal; Notable for the following components:   APPearance HAZY (*)    Specific Gravity, Urine 1.031 (*)    Glucose, UA >=500 (*)    Protein, ur 30 (*)    Bacteria, UA RARE (*)    All other components within normal limits  TROPONIN I (HIGH SENSITIVITY)  C-REACTIVE PROTEIN  D-DIMER, QUANTITATIVE (NOT AT [facility])  FERRITIN  FIBRINOGEN  INTERLEUKIN-6, PLASMA  LACTATE DEHYDROGENASE  PROCALCITONIN  SEDIMENTATION RATE  HEPATIC FUNCTION PANEL    EKG EKG Interpretation  Date/Time:  Monday August 28 2018 17:48:32 EDT Ventricular Rate:  99 PR Interval:    QRS Duration: 79 QT Interval:  303 QTC Calculation: 389 R Axis:   64 Text Interpretation:  Sinus rhythm Low voltage, precordial leads Probable anteroseptal infarct, old Confirmed by Gerlene Fee [phone]) on 08/28/2018 9:04:03 PM   Radiology Dg Chest Port 1 View  Result Date: 08/28/2018 CLINICAL DATA:  Chest pain.  Fever. EXAM: PORTABLE CHEST 1 VIEW COMPARISON:  September 29, 2017 FINDINGS:  The heart size is mildly enlarged. There are multifocal airspace opacities bilaterally. No pneumothorax. No large pleural effusion, however there is elevation of the right hemidiaphragm which limits detection of small pleural effusions. There is no acute osseous abnormality. IMPRESSION: Low lung volumes with multifocal airspace opacities concerning for multifocal pneumonia (viral or bacterial) or developing pulmonary edema. Electronically Signed   By: Constance Holster M.D.   On: 08/28/2018 19:50    Procedures Procedures (including critical care time)  Medications Ordered in ED Medications  levofloxacin (LEVAQUIN) IVPB 750 mg (750 mg Intravenous New Bag/Given 08/29/18 0008)     Initial Impression / Assessment and Plan / ED Course  I have reviewed the triage vital signs and the nursing notes.  Pertinent  labs & imaging results that were available during my care of the patient were reviewed by me and considered in my medical decision making (see chart for details).    43 year old female presenting to the ED with cough, SOB, chest pain, and subjective fevers after positive exposure to covid 19. Pt satting 95% on RA although has prolonged expiration; difficulty speaking in full sentences; LCTAB. Pt afebrile in the ED today and has not been taking antipyretics; mildly tachycardic when she arrived to the ED at 102 but while in the room pt nontachy. Currently does not meet SIRS criteria. Concern for covid vs pneumonia vs ACS. Will workup with chest x ray as well as baseline bloodwork including troponin. Pt has risk factors including diabetes and morbid obesity. PERC negative at this time; d dimer not obtained. Very low suspicion for PE today. Will also re-swab patient today. Will reevaluate once labs and imaging return. Personally reviewed chart; most recent echo 12/2017 with normal EF at 65-70%; no concern for CHF exacerbation at this time.   CXR concerning for multifocal pneumonia; will treat with IV Levaquin given allergy to both PCN, cephalosporins, and doxycycline. CBC without leukocytosis. BMP without electrolyte derangements. Initial high sens trop negative; covid test positive. Do not feel patient needs repeat delta trop at this time; symptoms all likely related to covid. While updating patient her O2 sats dropped down to 91% with coughing; she has increased work of breathing at this time as well. Feel she needs to be admitted for further management given her oxygen saturation fluctuating so much. Will call hospitalist at this time for admission.  Discussed case with hospitalist Dr. Marvis Repress who agrees to accept patient for admission.        Final Clinical Impressions(s) / ED Diagnoses   Final diagnoses:  [id number]    ED Discharge Orders    None       Eustaquio Maize, PA-C 08/29/18 0012     Maudie Flakes, MD 09/04/18 [phone]

## 2018-08-28 NOTE — ED Notes (Signed)
Bed: WA21 Expected date:  Expected time:  Means of arrival:  Comments: EMS seizure

## 2018-08-29 DIAGNOSIS — Z820 Family history of epilepsy and other diseases of the nervous system: Secondary | ICD-10-CM | POA: Diagnosis not present

## 2018-08-29 DIAGNOSIS — Z880 Allergy status to penicillin: Secondary | ICD-10-CM | POA: Diagnosis not present

## 2018-08-29 DIAGNOSIS — Z79899 Other long term (current) drug therapy: Secondary | ICD-10-CM | POA: Diagnosis not present

## 2018-08-29 DIAGNOSIS — J1289 Other viral pneumonia: Secondary | ICD-10-CM | POA: Diagnosis not present

## 2018-08-29 DIAGNOSIS — E785 Hyperlipidemia, unspecified: Secondary | ICD-10-CM | POA: Diagnosis not present

## 2018-08-29 DIAGNOSIS — Z91030 Bee allergy status: Secondary | ICD-10-CM | POA: Diagnosis not present

## 2018-08-29 DIAGNOSIS — G4733 Obstructive sleep apnea (adult) (pediatric): Secondary | ICD-10-CM | POA: Diagnosis not present

## 2018-08-29 DIAGNOSIS — U071 COVID-19: Secondary | ICD-10-CM | POA: Diagnosis not present

## 2018-08-29 DIAGNOSIS — Z6841 Body Mass Index (BMI) 40.0 and over, adult: Secondary | ICD-10-CM | POA: Diagnosis not present

## 2018-08-29 DIAGNOSIS — Z833 Family history of diabetes mellitus: Secondary | ICD-10-CM | POA: Diagnosis not present

## 2018-08-29 DIAGNOSIS — Z91013 Allergy to seafood: Secondary | ICD-10-CM | POA: Diagnosis not present

## 2018-08-29 DIAGNOSIS — E662 Morbid (severe) obesity with alveolar hypoventilation: Secondary | ICD-10-CM | POA: Diagnosis not present

## 2018-08-29 DIAGNOSIS — Z8249 Family history of ischemic heart disease and other diseases of the circulatory system: Secondary | ICD-10-CM | POA: Diagnosis not present

## 2018-08-29 DIAGNOSIS — Z794 Long term (current) use of insulin: Secondary | ICD-10-CM | POA: Diagnosis not present

## 2018-08-29 DIAGNOSIS — J45909 Unspecified asthma, uncomplicated: Secondary | ICD-10-CM

## 2018-08-29 DIAGNOSIS — E1165 Type 2 diabetes mellitus with hyperglycemia: Secondary | ICD-10-CM | POA: Diagnosis not present

## 2018-08-29 DIAGNOSIS — Z888 Allergy status to other drugs, medicaments and biological substances status: Secondary | ICD-10-CM | POA: Diagnosis not present

## 2018-08-29 DIAGNOSIS — K219 Gastro-esophageal reflux disease without esophagitis: Secondary | ICD-10-CM | POA: Diagnosis not present

## 2018-08-29 DIAGNOSIS — Z91040 Latex allergy status: Secondary | ICD-10-CM | POA: Diagnosis not present

## 2018-08-29 DIAGNOSIS — Z87891 Personal history of nicotine dependence: Secondary | ICD-10-CM | POA: Diagnosis not present

## 2018-08-29 DIAGNOSIS — G4710 Hypersomnia, unspecified: Secondary | ICD-10-CM | POA: Diagnosis not present

## 2018-08-29 DIAGNOSIS — Z806 Family history of leukemia: Secondary | ICD-10-CM | POA: Diagnosis not present

## 2018-08-29 DIAGNOSIS — J9601 Acute respiratory failure with hypoxia: Secondary | ICD-10-CM | POA: Diagnosis not present

## 2018-08-29 DIAGNOSIS — M069 Rheumatoid arthritis, unspecified: Secondary | ICD-10-CM | POA: Diagnosis not present

## 2018-08-29 DIAGNOSIS — Z9989 Dependence on other enabling machines and devices: Secondary | ICD-10-CM | POA: Diagnosis not present

## 2018-08-29 LAB — GLUCOSE, CAPILLARY
Glucose-Capillary: 358 mg/dL — ABNORMAL HIGH (ref 70–99)
Glucose-Capillary: 457 mg/dL — ABNORMAL HIGH (ref 70–99)
Glucose-Capillary: 409 mg/dL — ABNORMAL HIGH (ref 70–99)
Glucose-Capillary: 418 mg/dL — ABNORMAL HIGH (ref 70–99)

## 2018-08-29 LAB — GLUCOSE, RANDOM
Glucose, Bld: 350 mg/dL — ABNORMAL HIGH (ref 70–99)
Glucose, Bld: 411 mg/dL — ABNORMAL HIGH (ref 70–99)

## 2018-08-29 LAB — SEDIMENTATION RATE: Sed Rate: 15 mm/hr (ref 0–22)

## 2018-08-29 LAB — HEPATIC FUNCTION PANEL
ALT: 23 U/L (ref 0–44)
AST: 19 U/L (ref 15–41)
Albumin: 3.2 g/dL — ABNORMAL LOW (ref 3.5–5.0)
Alkaline Phosphatase: 36 U/L — ABNORMAL LOW (ref 38–126)
Bilirubin, Direct: 0.1 mg/dL (ref 0.0–0.2)
Indirect Bilirubin: 0.2 mg/dL — ABNORMAL LOW (ref 0.3–0.9)
Total Bilirubin: 0.3 mg/dL (ref 0.3–1.2)
Total Protein: 7.2 g/dL (ref 6.5–8.1)

## 2018-08-29 LAB — FIBRINOGEN: Fibrinogen: 106 mg/dL — ABNORMAL LOW (ref 210–475)

## 2018-08-29 LAB — HEMOGLOBIN A1C
Hgb A1c MFr Bld: 12 % — ABNORMAL HIGH (ref 4.8–5.6)
Mean Plasma Glucose: 297.7 mg/dL

## 2018-08-29 LAB — PROCALCITONIN: Procalcitonin: <0.1 ng/mL

## 2018-08-29 LAB — D-DIMER, QUANTITATIVE: D-Dimer, Quant: 1.5 ug/mL-FEU — ABNORMAL HIGH (ref 0.00–0.50)

## 2018-08-29 LAB — C-REACTIVE PROTEIN: CRP: 12.7 mg/dL — ABNORMAL HIGH (ref <1.0)

## 2018-08-29 LAB — FERRITIN: Ferritin: 86 ng/mL (ref 11–307)

## 2018-08-29 LAB — LACTATE DEHYDROGENASE: LDH: 197 U/L — ABNORMAL HIGH (ref 98–192)

## 2018-08-29 MED ORDER — PREDNISONE 20 MG PO TABS
40.0000 mg | ORAL_TABLET | Freq: Two times a day (BID) | ORAL | Status: DC
  Administered 2018-08-29 (×2): 40 mg via ORAL
  Filled 2018-08-29 (×2): qty 2

## 2018-08-29 MED ORDER — INSULIN ASPART 100 UNIT/ML ~~LOC~~ SOLN
0.0000 [IU] | SUBCUTANEOUS | Status: DC
  Administered 2018-08-29 – 2018-08-30 (×2): 20 [IU] via SUBCUTANEOUS
  Administered 2018-08-30: 15 [IU] via SUBCUTANEOUS
  Administered 2018-08-30 (×2): 20 [IU] via SUBCUTANEOUS
  Administered 2018-08-30: 11 [IU] via SUBCUTANEOUS
  Administered 2018-08-31 (×2): 15 [IU] via SUBCUTANEOUS
  Administered 2018-08-31: 05:00:00 11 [IU] via SUBCUTANEOUS
  Administered 2018-08-31: 15 [IU] via SUBCUTANEOUS
  Administered 2018-08-31: 09:00:00 7 [IU] via SUBCUTANEOUS
  Administered 2018-08-31: 20 [IU] via SUBCUTANEOUS
  Administered 2018-09-01: 4 [IU] via SUBCUTANEOUS
  Administered 2018-09-01: 05:00:00 11 [IU] via SUBCUTANEOUS
  Administered 2018-09-01: 7 [IU] via SUBCUTANEOUS
  Administered 2018-09-01: 20 [IU] via SUBCUTANEOUS
  Administered 2018-09-01: 21:00:00 15 [IU] via SUBCUTANEOUS
  Administered 2018-09-02: 11 [IU] via SUBCUTANEOUS
  Administered 2018-09-02: 7 [IU] via SUBCUTANEOUS
  Administered 2018-09-02: 3 [IU] via SUBCUTANEOUS

## 2018-08-29 MED ORDER — SODIUM CHLORIDE 0.9% FLUSH
3.0000 mL | Freq: Two times a day (BID) | INTRAVENOUS | Status: DC
  Administered 2018-08-30 – 2018-08-31 (×2): 3 mL via INTRAVENOUS

## 2018-08-29 MED ORDER — PANTOPRAZOLE SODIUM 40 MG PO TBEC
40.0000 mg | DELAYED_RELEASE_TABLET | Freq: Every day | ORAL | Status: DC
  Administered 2018-08-29 – 2018-09-02 (×5): 40 mg via ORAL
  Filled 2018-08-29 (×5): qty 1

## 2018-08-29 MED ORDER — INSULIN ASPART 100 UNIT/ML ~~LOC~~ SOLN: 0.0000 [IU] | Freq: Three times a day (TID) | SUBCUTANEOUS | Status: DC

## 2018-08-29 MED ORDER — SODIUM CHLORIDE 0.9% FLUSH
3.0000 mL | Freq: Two times a day (BID) | INTRAVENOUS | Status: DC
  Administered 2018-08-29 – 2018-08-30 (×3): 3 mL via INTRAVENOUS
  Administered 2018-08-30: 20:00:00 10 mL via INTRAVENOUS
  Administered 2018-08-31 – 2018-09-01 (×4): 3 mL via INTRAVENOUS

## 2018-08-29 MED ORDER — INSULIN ASPART 100 UNIT/ML ~~LOC~~ SOLN
15.0000 [IU] | Freq: Three times a day (TID) | SUBCUTANEOUS | Status: DC
  Administered 2018-08-29 – 2018-08-30 (×3): 15 [IU] via SUBCUTANEOUS

## 2018-08-29 MED ORDER — INSULIN GLARGINE 100 UNIT/ML ~~LOC~~ SOLN
75.0000 [IU] | Freq: Two times a day (BID) | SUBCUTANEOUS | Status: DC
  Administered 2018-08-29: 75 [IU] via SUBCUTANEOUS
  Filled 2018-08-29 (×2): qty 0.75

## 2018-08-29 MED ORDER — METHYLPREDNISOLONE SODIUM SUCC 40 MG IJ SOLR
40.0000 mg | Freq: Two times a day (BID) | INTRAMUSCULAR | Status: DC
  Administered 2018-08-29 – 2018-09-01 (×5): 40 mg via INTRAVENOUS
  Filled 2018-08-29 (×6): qty 1

## 2018-08-29 MED ORDER — ACETAMINOPHEN 325 MG PO TABS
650.0000 mg | ORAL_TABLET | Freq: Four times a day (QID) | ORAL | Status: DC | PRN
  Administered 2018-08-31 – 2018-09-01 (×2): 650 mg via ORAL
  Filled 2018-08-29 (×2): qty 2

## 2018-08-29 MED ORDER — MONTELUKAST SODIUM 10 MG PO TABS
10.0000 mg | ORAL_TABLET | Freq: Every day | ORAL | Status: DC
  Administered 2018-08-29 – 2018-09-01 (×4): 10 mg via ORAL
  Filled 2018-08-29 (×4): qty 1

## 2018-08-29 MED ORDER — SODIUM CHLORIDE 0.9 % IV SOLN
200.0000 mg | Freq: Once | INTRAVENOUS | Status: AC
  Administered 2018-08-29: 200 mg via INTRAVENOUS
  Filled 2018-08-29: qty 40

## 2018-08-29 MED ORDER — ONDANSETRON HCL 4 MG/2ML IJ SOLN: 4.0000 mg | Freq: Four times a day (QID) | INTRAMUSCULAR | Status: DC | PRN

## 2018-08-29 MED ORDER — ZINC SULFATE 220 (50 ZN) MG PO CAPS
220.0000 mg | ORAL_CAPSULE | Freq: Every day | ORAL | Status: DC
  Administered 2018-08-29 – 2018-09-02 (×5): 220 mg via ORAL
  Filled 2018-08-29 (×5): qty 1

## 2018-08-29 MED ORDER — SODIUM CHLORIDE 0.9% FLUSH: 3.0000 mL | INTRAVENOUS | Status: DC | PRN

## 2018-08-29 MED ORDER — SODIUM CHLORIDE 0.9 % IV SOLN: 250.0000 mL | INTRAVENOUS | Status: DC | PRN

## 2018-08-29 MED ORDER — MOMETASONE FURO-FORMOTEROL FUM 200-5 MCG/ACT IN AERO
2.0000 | INHALATION_SPRAY | Freq: Two times a day (BID) | RESPIRATORY_TRACT | Status: DC
  Administered 2018-08-29 – 2018-09-02 (×9): 2 via RESPIRATORY_TRACT
  Filled 2018-08-29: qty 8.8

## 2018-08-29 MED ORDER — INSULIN GLARGINE 100 UNIT/ML ~~LOC~~ SOLN
50.0000 [IU] | Freq: Two times a day (BID) | SUBCUTANEOUS | Status: DC
  Administered 2018-08-29: 09:00:00 50 [IU] via SUBCUTANEOUS
  Filled 2018-08-29: qty 0.5

## 2018-08-29 MED ORDER — INSULIN ASPART 100 UNIT/ML ~~LOC~~ SOLN
0.0000 [IU] | SUBCUTANEOUS | Status: DC
  Administered 2018-08-29 (×2): 9 [IU] via SUBCUTANEOUS
  Administered 2018-08-29: 14:00:00 3 [IU] via SUBCUTANEOUS

## 2018-08-29 MED ORDER — ENOXAPARIN SODIUM 40 MG/0.4ML ~~LOC~~ SOLN
40.0000 mg | SUBCUTANEOUS | Status: DC
  Administered 2018-08-29 – 2018-08-30 (×2): 40 mg via SUBCUTANEOUS
  Filled 2018-08-29 (×2): qty 0.4

## 2018-08-29 MED ORDER — INSULIN ASPART 100 UNIT/ML ~~LOC~~ SOLN
0.0000 [IU] | Freq: Every day | SUBCUTANEOUS | Status: DC
  Administered 2018-08-29: 5 [IU] via SUBCUTANEOUS

## 2018-08-29 MED ORDER — ONDANSETRON HCL 4 MG PO TABS: 4.0000 mg | ORAL_TABLET | Freq: Four times a day (QID) | ORAL | Status: DC | PRN

## 2018-08-29 MED ORDER — INSULIN GLARGINE 100 UNIT/ML ~~LOC~~ SOLN
15.0000 [IU] | Freq: Once | SUBCUTANEOUS | Status: AC
  Administered 2018-08-29: 15 [IU] via SUBCUTANEOUS
  Filled 2018-08-29: qty 0.15

## 2018-08-29 MED ORDER — INSULIN ASPART 100 UNIT/ML ~~LOC~~ SOLN
5.0000 [IU] | Freq: Once | SUBCUTANEOUS | Status: AC
  Administered 2018-08-29: 5 [IU] via INTRAVENOUS

## 2018-08-29 MED ORDER — SODIUM CHLORIDE 0.9 % IV SOLN
100.0000 mg | INTRAVENOUS | Status: DC
  Filled 2018-08-29: qty 20

## 2018-08-29 MED ORDER — VITAMIN C 500 MG PO TABS
500.0000 mg | ORAL_TABLET | Freq: Every day | ORAL | Status: DC
  Administered 2018-08-29 – 2018-09-02 (×5): 500 mg via ORAL
  Filled 2018-08-29 (×5): qty 1

## 2018-08-29 MED ORDER — INSULIN GLARGINE 100 UNIT/ML ~~LOC~~ SOLN
65.0000 [IU] | Freq: Two times a day (BID) | SUBCUTANEOUS | Status: DC
  Filled 2018-08-29: qty 0.65

## 2018-08-29 MED ORDER — INSULIN ASPART 100 UNIT/ML ~~LOC~~ SOLN
25.0000 [IU] | Freq: Once | SUBCUTANEOUS | Status: AC
  Administered 2018-08-29: 25 [IU] via SUBCUTANEOUS

## 2018-08-29 MED ORDER — ATORVASTATIN CALCIUM 40 MG PO TABS
40.0000 mg | ORAL_TABLET | Freq: Every day | ORAL | Status: DC
  Administered 2018-08-29 – 2018-09-01 (×4): 40 mg via ORAL
  Filled 2018-08-29 (×4): qty 1

## 2018-08-29 MED ORDER — ALBUTEROL SULFATE HFA 108 (90 BASE) MCG/ACT IN AERS
2.0000 | INHALATION_SPRAY | Freq: Four times a day (QID) | RESPIRATORY_TRACT | Status: DC
  Administered 2018-08-29 – 2018-09-01 (×8): 2 via RESPIRATORY_TRACT
  Filled 2018-08-29: qty 6.7

## 2018-08-29 MED ORDER — INSULIN ASPART 100 UNIT/ML ~~LOC~~ SOLN: 0.0000 [IU] | SUBCUTANEOUS | Status: DC

## 2018-08-29 NOTE — Progress Notes (Signed)
Inpatient Diabetes Program Recommendations  AACE/ADA: New Consensus Statement on Inpatient Glycemic Control (2015)  Target Ranges:  Prepandial:   less than 140 mg/dL      Peak postprandial:   less than 180 mg/dL (1-2 hours)      Critically ill patients:  140 - 180 mg/dL   Lab Results  Component Value Date   GLUCAP 457 (H) 08/29/2018   HGBA1C 12.0 (H) 08/29/2018    Review of Glycemic Control Results for JESSECA, MARSCH (MRN [id number]) as of 08/29/2018 12:59  Ref. Range 08/29/2018 05:54 08/29/2018 11:21  Glucose-Capillary Latest Ref Range: 70 - 99 mg/dL 358 (H) 457 (H)   Diabetes history: DM2 Outpatient Diabetes medications: Lantus 50 units bid + Novolog 15 units tid meal coverage Current orders for Inpatient glycemic control: Lantus 65 units + Novolog correction sensitive q 4 hrs.  Inpatient Diabetes Program Recommendations:   Noted patient is on Prednisone 40 mg bid. -Add Novolog 8 units tid meal coverage if eats 50% Secure chat sent to Dr. Roel Cluck.  Thank you, Nani Gasser. Shateka Petrea, RN, MSN, CDE  Diabetes Coordinator Inpatient Glycemic Control Team Team Pager [phone] (8am-5pm) 08/29/2018 1:01 PM

## 2018-08-29 NOTE — Progress Notes (Signed)
PROGRESS NOTE                                                                                                                                                                                                             Patient Demographics:    Ana Long, is a 43 y.o. female, DOB - 1975-03-22, YCX:[id number]  Admit date - 08/28/2018   Admitting Physician Toy Baker, MD  Outpatient Primary MD for the patient is Willeen Niece, [place]  LOS - 0   Chief Complaint  Patient presents with  . Shortness of Breath       Brief Narrative    43 y.o. female with medical history significant of Asthma, DM2, GERD, Obesity, RA, OSA on CPAP, herpes, she presents with complaints of shortness of breath for last 5 days, kids tested positive for COVID-19 3 days ago, tested positive for COVID-19 in ED, she transferred to [facility] for further management.   Subjective:    Ana Long today reports some dyspnea, mainly on exertion , ports some cough, generalized weakness.   Assessment  & Plan :    Active Problems:   Uncontrolled type 2 diabetes mellitus with hyperglycemia (HCC)   Hyperlipidemia   Morbid obesity with BMI of 50 to 60   GERD   Asthma   Hypersomnia with sleep apnea   OSA on CPAP   Pneumonia due to COVID-19 virus  Acute hypoxic respiratory failure due to COVID-19 pneumonia -Patient hypoxic, requiring oxygen supplements,chest  x-ray significant for multifocal pneumonia, and elevated CRP, as well she does have some wheezing. -New with IV Solu-Medrol -Did on IV Remdesivir -No indication  for Actemra or asthma -May be some mild asthma exacerbation certainly COPD contributing to her hypoxia as well  COVID-19 Labs  Recent Labs    08/28/18 2346  DDIMER 1.50*  FERRITIN 86  LDH 197*  CRP 12.7*    Lab Results  Component Value Date   SARSCOV2NAA POSITIVE (A) 08/28/2018   Diabetes mellitus -CBGs poorly controlled, follow on A1c,  increase her Lantus to 70 units twice daily, added resistant sliding scale and insulin before meals.  Hyperlipidemia -Continue with statin  GERD -Continue with home meds  Asthma -She had some scattered wheezing today, changed her steroids to IV  OSA -COVID-19, we cannot use CPAP currently, explained this to her, likely her  CPAP contributing to her hypoxia as it is more profound when she lays flat.  Code Status :   Family Communication  :   Disposition Plan  :   Barriers For Discharge : she remains hypoxic on IV steroids and IV remdesiivir.  Consults  : None  Procedures  : None  DVT Prophylaxis  : [place]   Lab Results  Component Value Date   PLT 110 (L) 08/28/2018    Antibiotics  :    Anti-infectives (From admission, onward)   Start     Dose/Rate Route Frequency Ordered Stop   08/30/18 0800  remdesivir 100 mg in sodium chloride 0.9 % 250 mL IVPB     100 mg 500 mL/hr over 30 Minutes Intravenous Every 24 hours 08/29/18 0533 09/03/18 0759   08/29/18 0600  remdesivir 200 mg in sodium chloride 0.9 % 250 mL IVPB     200 mg 500 mL/hr over 30 Minutes Intravenous Once 08/29/18 0533 08/29/18 0700   08/28/18 2330  levofloxacin (LEVAQUIN) IVPB 750 mg     750 mg 100 mL/hr over 90 Minutes Intravenous  Once 08/28/18 2319 08/29/18 0253        Objective:   Vitals:   08/29/18 1500 08/29/18 1510 08/29/18 1513 08/29/18 1600  BP:  (!) 142/77    Pulse: 73 86 100 100  Resp: (!) 25 (!) 22 (!) 22 17  Temp:  98.3 F (36.8 C)    TempSrc:  Oral    SpO2: (!) 86% (!) 80% 99% 100%  Weight:      Height:        Wt Readings from Last 3 Encounters:  08/28/18 (!) 140.2 kg  07/24/18 (!) 146.5 kg  06/06/18 (!) 143.3 kg     Intake/Output Summary (Last 24 hours) at 08/29/2018 1619 Last data filed at 08/29/2018 1400 Gross per 24 hour  Intake 480 ml  Output -  Net 480 ml     Physical Exam  Awake Alert, Oriented X 3, No new F.N deficits, Normal affect Symmetrical Chest wall movement,  Good air movement bilaterally,scattered wheezing RRR,No Gallops,Rubs or new Murmurs, No Parasternal Heave +ve B.Sounds, Abd Soft, No tenderness,  No rebound - guarding or rigidity. No Cyanosis, Clubbing or edema, No new Rash or bruise     Data Review:    CBC Recent Labs  Lab 08/28/18 2003  WBC 5.0  HGB 12.0  HCT 40.9  PLT 110*  MCV 83.0  MCH 24.3*  MCHC 29.3*  RDW 16.6*  LYMPHSABS 1.3  MONOABS 0.2  EOSABS 0.0  BASOSABS 0.0    Chemistries  Recent Labs  Lab 08/28/18 2003 08/29/18 0004 08/29/18 0550 08/29/18 1300  NA 134*  --   --   --   K 4.3  --   --   --   CL 100  --   --   --   CO2 25  --   --   --   GLUCOSE 208*  --  350* 411*  BUN 9  --   --   --   CREATININE 0.61  --   --   --   CALCIUM 8.3*  --   --   --   AST  --  19  --   --   ALT  --  23  --   --   ALKPHOS  --  36*  --   --   BILITOT  --  0.3  --   --    ------------------------------------------------------------------------------------------------------------------  No results for input(s): CHOL, HDL, LDLCALC, TRIG, CHOLHDL, LDLDIRECT in the last 72 hours.  Lab Results  Component Value Date   HGBA1C 12.0 (H) 08/29/2018   ------------------------------------------------------------------------------------------------------------------ No results for input(s): TSH, T4TOTAL, T3FREE, THYROIDAB in the last 72 hours.  Invalid input(s): FREET3 ------------------------------------------------------------------------------------------------------------------ Recent Labs    08/28/18 2346  FERRITIN 86    Coagulation profile No results for input(s): INR, PROTIME in the last 168 hours.  Recent Labs    08/28/18 2346  DDIMER 1.50*    Cardiac Enzymes No results for input(s): CKMB, TROPONINI, MYOGLOBIN in the last 168 hours.  Invalid input(s): CK ------------------------------------------------------------------------------------------------------------------    Component Value Date/Time   BNP  15.7 01/11/2018 1435    Inpatient Medications  Scheduled Meds: . albuterol  2 puff Inhalation Q6H  . atorvastatin  40 mg Oral QHS  . enoxaparin (LOVENOX) injection  40 mg Subcutaneous Q24H  . insulin aspart  0-9 Units Subcutaneous Q4H  . insulin aspart  15 Units Subcutaneous TID WC  . insulin glargine  75 Units Subcutaneous BID  . methylPREDNISolone (SOLU-MEDROL) injection  40 mg Intravenous Q12H  . mometasone-formoterol  2 puff Inhalation BID  . montelukast  10 mg Oral QHS  . pantoprazole  40 mg Oral Daily  . sodium chloride flush  3 mL Intravenous Q12H  . sodium chloride flush  3 mL Intravenous Q12H  . vitamin C  500 mg Oral Daily  . zinc sulfate  220 mg Oral Daily   Continuous Infusions: . sodium chloride    . [START ON 08/30/2018] remdesivir 100 mg in NS 250 mL     PRN Meds:.sodium chloride, acetaminophen, ondansetron **OR** ondansetron (ZOFRAN) IV, sodium chloride flush  Micro Results Recent Results (from the past 240 hour(s))  SARS Coronavirus 2 (CEPHEID- Performed in [place] hospital lab), Hosp Order     Status: Abnormal   Collection Time: 08/28/18  8:03 PM   Specimen: Nasopharyngeal Swab  Result Value Ref Range Status   SARS Coronavirus 2 POSITIVE (A) NEGATIVE Final    Comment: CRITICAL RESULT CALLED TO, READ BACK BY AND VERIFIED WITH: RN Vernon Prey AT 2304 08/28/18 CRU[place]ANK A (NOTE) If result is NEGATIVE SARS-CoV-2 target nucleic acids are NOT DETECTED. The SARS-CoV-2 RNA is generally detectable in upper and lower  respiratory specimens during the acute phase of infection. The lowest  concentration of SARS-CoV-2 viral copies this assay can detect is 250  copies / mL. A negative result does not preclude SARS-CoV-2 infection  and should not be used as the sole basis for treatment or other  patient management decisions.  A negative result may occur with  improper specimen collection / handling, submission of specimen other  than nasopharyngeal swab, presence of  viral mutation(s) within the  areas targeted by this assay, and inadequate number of viral copies  (<250 copies / mL). A negative result must be combined with clinical  observations, patient history, and epidemiological information. If result is POSITIVE SARS-CoV-2 target nucleic acids ar e DETECTED. The SARS-CoV-2 RNA is generally detectable in upper and lower  respiratory specimens during the acute phase of infection.  Positive  results are indicative of active infection with SARS-CoV-2.  Clinical  correlation with patient history and other diagnostic information is  necessary to determine patient infection status.  Positive results do  not rule out bacterial infection or co-infection with other viruses. If result is PRESUMPTIVE POSTIVE SARS-CoV-2 nucleic acids MAY BE PRESENT.   A presumptive positive result was obtained on  the submitted specimen  and confirmed on repeat testing.  While 2019 novel coronavirus  (SARS-CoV-2) nucleic acids may be present in the submitted sample  additional confirmatory testing may be necessary for epidemiological  and / or clinical management purposes  to differentiate between  SARS-CoV-2 and other Sarbecovirus currently known to infect humans.  If clinically indicated additional testing with an alternate test  methodolog y [phone]) is advised. The SARS-CoV-2 RNA is generally  detectable in upper and lower respiratory specimens during the acute  phase of infection. The expected result is Negative. Fact Sheet for Patients:  StrictlyIdeas.no Fact Sheet for Healthcare Providers: BankingDealers.co.za This test is not yet approved or cleared by the [place] FDA and has been authorized for detection and/or diagnosis of SARS-CoV-2 by FDA under an Emergency Use Authorization (EUA).  This EUA will remain in effect (meaning this test can be used) for the duration of the COVID-19 declaration under Section  564(b)(1) of the Act, 21 U.S.C. section 360bbb-3(b)(1), unless the authorization is terminated or revoked sooner. Performed at [facility], [place] [address] [place], [place] [address]   Culture, blood (Routine X 2) w Reflex to ID Panel     Status: None (Preliminary result)   Collection Time: 08/29/18  5:50 AM   Specimen: BLOOD  Result Value Ref Range Status   Specimen Description   Final    BLOOD RIGHT ANTECUBITAL Performed at [facility], [place] [address] [place], [place] [address]    Special Requests   Final    BOTTLES DRAWN AEROBIC ONLY Blood Culture adequate volume Performed at [facility] [address] [place], [place] [address]    Culture PENDING  Incomplete   Report Status PENDING  Incomplete  Culture, blood (Routine X 2) w Reflex to ID Panel     Status: None (Preliminary result)   Collection Time: 08/29/18  5:50 AM   Specimen: BLOOD  Result Value Ref Range Status   Specimen Description   Final    BLOOD LEFT ANTECUBITAL Performed at [facility], [place] [address], [place], [place] [address]    Special Requests   Final    BOTTLES DRAWN AEROBIC ONLY Blood Culture adequate volume Performed at [place] 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 EXAM: MRI OF THE RIGHT SHOULDER WITHOUT CONTRAST TECHNIQUE: Multiplanar, multisequence MR imaging of the shoulder was performed. No intravenous contrast was administered. COMPARISON:  Right shoulder x-rays dated November 22, 2017. FINDINGS: Rotator cuff: Supraspinatus tendinosis with high-grade partial-thickness articular surface tear involving the entire distal tendon, measuring 2.3 cm in AP dimension.  Distal infraspinatus tendinosis with bursal surface fraying. 2.3 x 2.0 x 0.6 cm multiloculated ganglion cyst along the infraspinatus myotendinous junction. The subscapularis and teres minor tendons are unremarkable. Muscles: No atrophy or abnormal signal of the muscles of the rotator cuff. Biceps long head: Intact and normally positioned. Severe intra-articular tendinosis. Acromioclavicular Joint: Normal acromioclavicular joint. Type II acromion. Trace subacromial/subdeltoid bursal fluid. Glenohumeral Joint: Small joint effusion. Focal full-thickness cartilage loss along the inferior glenoid with subchondral marrow edema and small geode. Small humeral head marginal osteophyte. Labrum:  Torn superior labrum. Bones: No acute fracture or dislocation. No  suspicious bone lesion. Other: None. IMPRESSION: 1. Supraspinatus tendinosis with high-grade partial-thickness articular surface tear involving the entire distal tendon. 2. Distal infraspinatus tendinosis with bursal surface fraying. 2.3 cm ganglion cyst along the infraspinatus myotendinous junction. 3. Severe intra-articular biceps tendinosis. 4. Superior labral tear. 5. Mild glenohumeral osteoarthritis with focal full-thickness cartilage loss over the inferior glenoid as described above. Electronically Signed   By: Titus Dubin M.D.   On: 08/04/2018 11:06   Dg Chest Port 1 View  Result Date: 08/28/2018 CLINICAL DATA:  Chest pain.  Fever. EXAM: PORTABLE CHEST 1 VIEW COMPARISON:  September 29, 2017 FINDINGS: The heart size is mildly enlarged. There are multifocal airspace opacities bilaterally. No pneumothorax. No large pleural effusion, however there is elevation of the right hemidiaphragm which limits detection of small pleural effusions. There is no acute osseous abnormality. IMPRESSION: Low lung volumes with multifocal airspace opacities concerning for multifocal pneumonia (viral or bacterial) or developing pulmonary edema. Electronically Signed   By: Constance Holster M.D.   On: 08/28/2018 19:50    Time Spent in minutes  25 minutes   Phillips Climes M.D on 08/29/2018 at 4:19 PM  Between 7am to 7pm - Pager - [phone]  After 7pm go to [email or web address] - password [facility]  [facility] -  Office  [phone]

## 2018-08-29 NOTE — Progress Notes (Signed)
Called to check on stat glucose lab that was ordered for this patient and found that phlebotomist had cancelled the lab.  Explained that this was a glucose verification and the lab needed to be drawn stat.  Says shes putting the order back in.

## 2018-08-29 NOTE — Plan of Care (Signed)

## 2018-08-29 NOTE — Progress Notes (Signed)
MEDICATION RELATED CONSULT NOTE - FOLLOW UP   Pharmacy Consult for Remdesivir Indication: COVID-19  Assessment: 43 yo F presents with SOB. Diagnosed with COVID-19. On [place] and CXR shows airspace opacities. ALT wnl.   Plan:  Give remdesivir 221m IV x 1, then start remdesivir 1052mIV x 4 days Monitor clinical progress and ALT   NaElenor QuinonesPharmD, BCPS, BCIDP Clinical Pharmacist 08/29/2018 2:20 AM

## 2018-08-30 LAB — GLUCOSE, CAPILLARY
Glucose-Capillary: 358 mg/dL — ABNORMAL HIGH (ref 70–99)
Glucose-Capillary: 354 mg/dL — ABNORMAL HIGH (ref 70–99)
Glucose-Capillary: 344 mg/dL — ABNORMAL HIGH (ref 70–99)
Glucose-Capillary: 263 mg/dL — ABNORMAL HIGH (ref 70–99)

## 2018-08-30 LAB — COMPREHENSIVE METABOLIC PANEL
ALT: 22 U/L (ref 0–44)
AST: 15 U/L (ref 15–41)
Albumin: 3.4 g/dL — ABNORMAL LOW (ref 3.5–5.0)
Alkaline Phosphatase: 42 U/L (ref 38–126)
Anion gap: 9 (ref 5–15)
BUN: 17 mg/dL (ref 6–20)
CO2: 25 mmol/L (ref 22–32)
Calcium: 9 mg/dL (ref 8.9–10.3)
Chloride: 102 mmol/L (ref 98–111)
Creatinine, Ser: 0.67 mg/dL (ref 0.44–1.00)
GFR calc Af Amer: >60 mL/min (ref >60)
GFR calc non Af Amer: >60 mL/min (ref >60)
Glucose, Bld: 371 mg/dL — ABNORMAL HIGH (ref 70–99)
Potassium: 4.9 mmol/L (ref 3.5–5.1)
Sodium: 136 mmol/L (ref 135–145)
Total Bilirubin: 0.3 mg/dL (ref 0.3–1.2)
Total Protein: 8 g/dL (ref 6.5–8.1)

## 2018-08-30 LAB — CBC WITH DIFFERENTIAL/PLATELET
Abs Immature Granulocytes: 0.03 10*3/uL (ref 0.00–0.07)
Basophils Absolute: 0 10*3/uL (ref 0.0–0.1)
Basophils Relative: 0 %
Eosinophils Absolute: 0 10*3/uL (ref 0.0–0.5)
Eosinophils Relative: 0 %
HCT: 40.3 % (ref 36.0–46.0)
Hemoglobin: 11.9 g/dL — ABNORMAL LOW (ref 12.0–15.0)
Immature Granulocytes: 0 %
Lymphocytes Relative: 12 %
Lymphs Abs: 1 10*3/uL (ref 0.7–4.0)
MCH: 24.5 pg — ABNORMAL LOW (ref 26.0–34.0)
MCHC: 29.5 g/dL — ABNORMAL LOW (ref 30.0–36.0)
MCV: 82.9 fL (ref 80.0–100.0)
Monocytes Absolute: 0.2 10*3/uL (ref 0.1–1.0)
Monocytes Relative: 3 %
Neutro Abs: 7 10*3/uL (ref 1.7–7.7)
Neutrophils Relative %: 85 %
Platelets: 293 10*3/uL (ref 150–400)
RBC: 4.86 MIL/uL (ref 3.87–5.11)
RDW: 16.5 % — ABNORMAL HIGH (ref 11.5–15.5)
WBC: 8.3 10*3/uL (ref 4.0–10.5)
nRBC: 0 % (ref 0.0–0.2)

## 2018-08-30 LAB — INTERLEUKIN-6, PLASMA: Interleukin-6, Plasma: 71.7 pg/mL — ABNORMAL HIGH (ref 0.0–12.2)

## 2018-08-30 LAB — FERRITIN: Ferritin: 114 ng/mL (ref 11–307)

## 2018-08-30 LAB — C-REACTIVE PROTEIN: CRP: 16.1 mg/dL — ABNORMAL HIGH (ref <1.0)

## 2018-08-30 LAB — D-DIMER, QUANTITATIVE: D-Dimer, Quant: 0.39 ug/mL-FEU (ref 0.00–0.50)

## 2018-08-30 LAB — MAGNESIUM: Magnesium: 1.9 mg/dL (ref 1.7–2.4)

## 2018-08-30 LAB — ABO/RH: ABO/RH(D): A POS

## 2018-08-30 MED ORDER — SODIUM CHLORIDE 0.9 % IV SOLN
100.0000 mg | INTRAVENOUS | Status: DC
  Administered 2018-08-30 – 2018-09-01 (×3): 100 mg via INTRAVENOUS
  Filled 2018-08-30 (×3): qty 20

## 2018-08-30 MED ORDER — ENOXAPARIN SODIUM 80 MG/0.8ML ~~LOC~~ SOLN
70.0000 mg | SUBCUTANEOUS | Status: DC
  Administered 2018-08-31 – 2018-09-02 (×3): 70 mg via SUBCUTANEOUS
  Filled 2018-08-30 (×3): qty 0.8

## 2018-08-30 MED ORDER — BENZONATATE 100 MG PO CAPS
200.0000 mg | ORAL_CAPSULE | Freq: Three times a day (TID) | ORAL | Status: DC | PRN
  Administered 2018-08-30 – 2018-09-01 (×4): 200 mg via ORAL
  Filled 2018-08-30 (×5): qty 2

## 2018-08-30 MED ORDER — INSULIN REGULAR HUMAN (CONC) 500 UNIT/ML ~~LOC~~ SOPN
95.0000 [IU] | PEN_INJECTOR | Freq: Two times a day (BID) | SUBCUTANEOUS | Status: DC
  Administered 2018-08-30: 95 [IU] via SUBCUTANEOUS
  Filled 2018-08-30: qty 3

## 2018-08-30 MED ORDER — INSULIN REGULAR HUMAN (CONC) 500 UNIT/ML ~~LOC~~ SOLN
95.0000 [IU] | Freq: Two times a day (BID) | SUBCUTANEOUS | Status: DC
  Filled 2018-08-30: qty 20

## 2018-08-30 MED ORDER — INSULIN GLARGINE 100 UNIT/ML ~~LOC~~ SOLN
95.0000 [IU] | Freq: Two times a day (BID) | SUBCUTANEOUS | Status: DC
  Administered 2018-08-30: 95 [IU] via SUBCUTANEOUS
  Filled 2018-08-30: qty 0.95

## 2018-08-30 MED ORDER — LORATADINE 10 MG PO TABS
10.0000 mg | ORAL_TABLET | Freq: Every day | ORAL | Status: DC | PRN
  Administered 2018-09-01: 10 mg via ORAL
  Filled 2018-08-30 (×2): qty 1

## 2018-08-30 MED ORDER — INSULIN ASPART 100 UNIT/ML ~~LOC~~ SOLN
20.0000 [IU] | Freq: Three times a day (TID) | SUBCUTANEOUS | Status: DC
  Administered 2018-08-30: 20 [IU] via SUBCUTANEOUS
  Administered 2018-08-31: 09:00:00 10 [IU] via SUBCUTANEOUS

## 2018-08-30 MED ORDER — LIVING WELL WITH DIABETES BOOK
Freq: Once | Status: AC
  Administered 2018-08-30: 17:00:00
  Filled 2018-08-30: qty 1

## 2018-08-30 NOTE — Progress Notes (Signed)
PROGRESS NOTE                                                                                                                                                                                                             Patient Demographics:    Ana Long, is a 43 y.o. female, DOB - Jul 03, 1975, LTJ:[id number]  Admit date - 08/28/2018   Admitting Physician Toy Baker, MD  Outpatient Primary MD for the patient is Willeen Niece, [place]  LOS - 1   Chief Complaint  Patient presents with   Shortness of Breath       Brief Narrative    43 y.o. female with medical history significant of Asthma, DM2, GERD, Obesity, RA, OSA on CPAP, herpes, she presents with complaints of shortness of breath for last 5 days, kids tested positive for COVID-19 3 days ago, tested positive for COVID-19 in ED, she transferred to [facility] for further management.   Subjective:    Ana Long today any nausea or vomiting, still reports some dyspnea, reports she did tolerate the heated high flow nasal cannula overnight.   Assessment  & Plan :    Active Problems:   Uncontrolled type 2 diabetes mellitus with hyperglycemia (HCC)   Hyperlipidemia   Morbid obesity with BMI of 50 to 60   GERD   Asthma   Hypersomnia with sleep apnea   OSA on CPAP   Pneumonia due to COVID-19 virus   COVID-19 virus infection  Acute hypoxic respiratory failure due to COVID-19 pneumonia -Patient hypoxic, requiring oxygen supplements,chest  x-ray significant for multifocal pneumonia, and elevated CRP, as well she does have some wheezing, does require heated high flow nasal cannula overnight as well. -CRP continues to trend up despite being started on steroids, continue to monitor closely. -Continue with IV Remdesivir -No indication for Actemra or plasma currently. -May be some mild asthma exacerbation and COPD  contributing to her hypoxia as well  COVID-19 Labs  Recent Labs   08/28/18 2346 08/30/18 0318  DDIMER 1.50* 0.39  FERRITIN 86 114  LDH 197*  --   CRP 12.7* 16.1*    Lab Results  Component Value Date   SARSCOV2NAA POSITIVE (A) 08/28/2018   Diabetes mellitus -CBG remains very poorly controlled, her A1c is 12 which is indicative of average CBG around 300 . -I have increased  her insulin dosing significantly, even with that she remains very poorly controlled, currently she is on Lantus 95 units twice daily, 10 units before meals, and resistant sliding scale every 4 hours .she will be transitioned to U5 100 CBGs poorly controlled, follow on A1c, increase her Lantus to 70 units twice daily, added resistant sliding scale and insulin before meals.  Hyperlipidemia -Continue with statin  GERD -Continue with home meds  Asthma -She had some scattered wheezing , changed her steroids to IV  OSA -COVID-19, we cannot use CPAP currently, explained this to her, likely her CPAP contributing to her hypoxia as it is more profound when she lays flat.  He is requiring heated high flow nasal cannula overnight.  Code Status :   Family Communication  :   Disposition Plan  :   Barriers For Discharge : she remains hypoxic on IV steroids and IV remdesiivir.  Requiring frequent adjustments of her insulin regimen  Consults  : None  Procedures  : None  DVT Prophylaxis  : [place]   Lab Results  Component Value Date   PLT 293 08/30/2018    Antibiotics  :    Anti-infectives (From admission, onward)   Start     Dose/Rate Route Frequency Ordered Stop   08/30/18 0800  remdesivir 100 mg in sodium chloride 0.9 % 250 mL IVPB     100 mg 500 mL/hr over 30 Minutes Intravenous Every 24 hours 08/29/18 0533 09/03/18 0759   08/29/18 0600  remdesivir 200 mg in sodium chloride 0.9 % 250 mL IVPB     200 mg 500 mL/hr over 30 Minutes Intravenous Once 08/29/18 0533 08/29/18 0700   08/28/18 2330  levofloxacin (LEVAQUIN) IVPB 750 mg     750 mg 100 mL/hr over 90 Minutes Intravenous   Once 08/28/18 2319 08/29/18 0253        Objective:   Vitals:   08/30/18 0320 08/30/18 0333 08/30/18 0800 08/30/18 0831  BP:  112/71 121/64   Pulse: 60 68 76 (!) 53  Resp: (!) 25 (!) 26 (!) 27 (!) 21  Temp:  97.7 F (36.5 C) 98.3 F (36.8 C)   TempSrc:  Oral Oral   SpO2: 100% 100% 100% 100%  Weight:      Height:        Wt Readings from Last 3 Encounters:  08/28/18 (!) 140.2 kg  07/24/18 (!) 146.5 kg  06/06/18 (!) 143.3 kg     Intake/Output Summary (Last 24 hours) at 08/30/2018 1313 Last data filed at 08/30/2018 0800 Gross per 24 hour  Intake 600 ml  Output --  Net 600 ml     Physical Exam  Awake Alert, Oriented X 3, No new F.N deficits, Normal affect Symmetrical Chest wall movement, Good air movement bilaterally, scattered wheezing RRR,No Gallops,Rubs or new Murmurs, No Parasternal Heave +ve B.Sounds, Abd Soft, No tenderness, No rebound - guarding or rigidity. No Cyanosis, Clubbing or edema, No new Rash or bruise      Data Review:    CBC Recent Labs  Lab 08/28/18 2003 08/30/18 0318  WBC 5.0 8.3  HGB 12.0 11.9*  HCT 40.9 40.3  PLT 110* 293  MCV 83.0 82.9  MCH 24.3* 24.5*  MCHC 29.3* 29.5*  RDW 16.6* 16.5*  LYMPHSABS 1.3 1.0  MONOABS 0.2 0.2  EOSABS 0.0 0.0  BASOSABS 0.0 0.0    Chemistries  Recent Labs  Lab 08/28/18 2003 08/29/18 0004 08/29/18 0550 08/29/18 1300 08/30/18 0318  NA 134*  --   --   --  136  K 4.3  --   --   --  4.9  CL 100  --   --   --  102  CO2 25  --   --   --  25  GLUCOSE 208*  --  350* 411* 371*  BUN 9  --   --   --  17  CREATININE 0.61  --   --   --  0.67  CALCIUM 8.3*  --   --   --  9.0  MG  --   --   --   --  1.9  AST  --  19  --   --  15  ALT  --  23  --   --  22  ALKPHOS  --  36*  --   --  42  BILITOT  --  0.3  --   --  0.3   ------------------------------------------------------------------------------------------------------------------ No results for input(s): CHOL, HDL, LDLCALC, TRIG, CHOLHDL, LDLDIRECT in  the last 72 hours.  Lab Results  Component Value Date   HGBA1C 12.0 (H) 08/29/2018   ------------------------------------------------------------------------------------------------------------------ No results for input(s): TSH, T4TOTAL, T3FREE, THYROIDAB in the last 72 hours.  Invalid input(s): FREET3 ------------------------------------------------------------------------------------------------------------------ Recent Labs    08/28/18 2346 08/30/18 0318  FERRITIN 86 114    Coagulation profile No results for input(s): INR, PROTIME in the last 168 hours.  Recent Labs    08/28/18 2346 08/30/18 0318  DDIMER 1.50* 0.39    Cardiac Enzymes No results for input(s): CKMB, TROPONINI, MYOGLOBIN in the last 168 hours.  Invalid input(s): CK ------------------------------------------------------------------------------------------------------------------    Component Value Date/Time   BNP 15.7 01/11/2018 1435    Inpatient Medications  Scheduled Meds:  albuterol  2 puff Inhalation Q6H   atorvastatin  40 mg Oral QHS   enoxaparin (LOVENOX) injection  40 mg Subcutaneous Q24H   insulin aspart  0-20 Units Subcutaneous Q4H   insulin aspart  20 Units Subcutaneous TID WC   insulin glargine  95 Units Subcutaneous BID   methylPREDNISolone (SOLU-MEDROL) injection  40 mg Intravenous Q12H   mometasone-formoterol  2 puff Inhalation BID   montelukast  10 mg Oral QHS   pantoprazole  40 mg Oral Daily   sodium chloride flush  3 mL Intravenous Q12H   sodium chloride flush  3 mL Intravenous Q12H   vitamin C  500 mg Oral Daily   zinc sulfate  220 mg Oral Daily   Continuous Infusions:  sodium chloride     remdesivir 100 mg in NS 250 mL     PRN Meds:.sodium chloride, acetaminophen, ondansetron **OR** ondansetron (ZOFRAN) IV, sodium chloride flush  Micro Results Recent Results (from the past 240 hour(s))  SARS Coronavirus 2 (CEPHEID- Performed in [facility]  lab), Hosp Order     Status: Abnormal   Collection Time: 08/28/18  8:03 PM   Specimen: Nasopharyngeal Swab  Result Value Ref Range Status   SARS Coronavirus 2 POSITIVE (A) NEGATIVE Final    Comment: CRITICAL RESULT CALLED TO, READ BACK BY AND VERIFIED WITH: RN Vernon Prey AT 2304 08/28/18 CRUICKSHANK A (NOTE) If result is NEGATIVE SARS-CoV-2 target nucleic acids are NOT DETECTED. The SARS-CoV-2 RNA is generally detectable in upper and lower  respiratory specimens during the acute phase of infection. The lowest  concentration of SARS-CoV-2 viral copies this assay can detect is 250  copies / mL. A negative result does not preclude SARS-CoV-2 infection  and should not be used as the sole basis for  treatment or other  patient management decisions.  A negative result may occur with  improper specimen collection / handling, submission of specimen other  than nasopharyngeal swab, presence of viral mutation(s) within the  areas targeted by this assay, and inadequate number of viral copies  (<250 copies / mL). A negative result must be combined with clinical  observations, patient history, and epidemiological information. If result is POSITIVE SARS-CoV-2 target nucleic acids ar e DETECTED. The SARS-CoV-2 RNA is generally detectable in upper and lower  respiratory specimens during the acute phase of infection.  Positive  results are indicative of active infection with SARS-CoV-2.  Clinical  correlation with patient history and other diagnostic information is  necessary to determine patient infection status.  Positive results do  not rule out bacterial infection or co-infection with other viruses. If result is PRESUMPTIVE POSTIVE SARS-CoV-2 nucleic acids MAY BE PRESENT.   A presumptive positive result was obtained on the submitted specimen  and confirmed on repeat testing.  While 2019 novel coronavirus  (SARS-CoV-2) nucleic acids may be present in the submitted sample  additional confirmatory  testing may be necessary for epidemiological  and / or clinical management purposes  to differentiate between  SARS-CoV-2 and other Sarbecovirus currently known to infect humans.  If clinically indicated additional testing with an alternate test  methodolog y [id number]) is advised. The SARS-CoV-2 RNA is generally  detectable in upper and lower respiratory specimens during the acute  phase of infection. The expected result is Negative. Fact Sheet for Patients:  StrictlyIdeas.no Fact Sheet for Healthcare Providers: BankingDealers.co.za This test is not yet approved or cleared by the [place] FDA and has been authorized for detection and/or diagnosis of SARS-CoV-2 by FDA under an Emergency Use Authorization (EUA).  This EUA will remain in effect (meaning this test can be used) for the duration of the COVID-19 declaration under Section 564(b)(1) of the Act, 21 U.S.C. section 360bbb-3(b)(1), unless the authorization is terminated or revoked sooner. Performed at [facility], [place] [address], [place], [place] [address]   Culture, blood (Routine X 2) w Reflex to ID Panel     Status: None (Preliminary result)   Collection Time: 08/29/18  5:50 AM   Specimen: BLOOD  Result Value Ref Range Status   Specimen Description   Final    BLOOD RIGHT ANTECUBITAL Performed at [facility], [place] [address] [place], [place] [address]    Special Requests   Final    BOTTLES DRAWN AEROBIC ONLY Blood Culture adequate volume Performed at [place] [address] [place], [place] [address]    Culture PENDING  Incomplete   Report Status PENDING  Incomplete  Culture, blood (Routine X 2) w Reflex to ID Panel     Status: None (Preliminary result)   Collection Time: 08/29/18  5:50 AM   Specimen: BLOOD  Result Value Ref Range Status   Specimen Description   Final    BLOOD LEFT ANTECUBITAL Performed at [facility], [place] [address] [place], [place] [address]    Special Requests   Final    BOTTLES DRAWN AEROBIC ONLY Blood Culture adequate volume Performed at [place] [address] [place], [place] [address]    Culture PENDING  Incomplete   Report Status PENDING  Incomplete    Radiology Reports Mr Shoulder Right Wo Contrast  Result Date: 08/04/2018 CLINICAL DATA:  Chronic shoulder pain and decreased range of motion 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 EXAM: MRI OF THE RIGHT SHOULDER WITHOUT CONTRAST TECHNIQUE: Multiplanar, multisequence MR imaging of the shoulder was performed. No intravenous contrast was administered. COMPARISON:  Right shoulder x-rays dated November 22, 2017. FINDINGS: Rotator cuff: Supraspinatus tendinosis with high-grade partial-thickness articular surface tear involving the entire distal tendon, measuring 2.3 cm in AP dimension. Distal infraspinatus tendinosis with bursal surface fraying. 2.3 x 2.0 x 0.6 cm multiloculated ganglion cyst along the infraspinatus myotendinous junction. The subscapularis and teres minor tendons are unremarkable. Muscles: No atrophy or abnormal signal of the muscles of the rotator cuff. Biceps long head: Intact and normally positioned. Severe intra-articular tendinosis. Acromioclavicular Joint: Normal acromioclavicular joint. Type II acromion. Trace subacromial/subdeltoid bursal fluid. Glenohumeral Joint: Small joint effusion. Focal full-thickness cartilage loss along the inferior glenoid with subchondral marrow edema and small geode. Small humeral head marginal osteophyte. Labrum:  Torn superior labrum. Bones: No acute fracture or dislocation. No suspicious bone lesion. Other: None. IMPRESSION: 1. Supraspinatus tendinosis with high-grade partial-thickness articular surface tear involving the entire distal tendon. 2. Distal infraspinatus tendinosis with bursal surface fraying. 2.3 cm ganglion cyst along the infraspinatus  myotendinous junction. 3. Severe intra-articular biceps tendinosis. 4. Superior labral tear. 5. Mild glenohumeral osteoarthritis with focal full-thickness cartilage loss over the inferior glenoid as described above. Electronically Signed   By: Titus Dubin M.D.   On: 08/04/2018 11:06   Dg Chest Port 1 View  Result Date: 08/28/2018 CLINICAL DATA:  Chest pain.  Fever. EXAM: PORTABLE CHEST 1 VIEW COMPARISON:  September 29, 2017 FINDINGS: The heart size is mildly enlarged. There are multifocal airspace opacities bilaterally. No pneumothorax. No large pleural effusion, however there is elevation of the right hemidiaphragm which limits detection of small pleural effusions. There is no acute osseous abnormality. IMPRESSION: Low lung volumes with multifocal airspace opacities concerning for multifocal pneumonia (viral or bacterial) or developing pulmonary edema. Electronically Signed   By: Constance Holster M.D.   On: 08/28/2018 19:50    Time Spent in minutes  25 minutes   Phillips Climes M.D on 08/30/2018 at 1:13 PM  Between 7am to 7pm - Pager - [phone]  After 7pm go to [email or web address] - password [facility]  [facility] -  Office  [phone]

## 2018-08-30 NOTE — Progress Notes (Signed)
Patient resting comfortably in chair on room air, occasional dry cough, using IS and flutter frequently, able to ambulate independently to the bathroom with no resp distress, CBG at 2000 was 420, text page sent to MD, he called back and said to give the 20 units of coverage with no additional at this time, she had eaten late and received solumedrol late, will recheck at midnight. Offered to call and update primary person listed but patient declined- stated she has been in contact with family and friends all day long. Will continue to monitor.

## 2018-08-30 NOTE — Plan of Care (Signed)

## 2018-08-30 NOTE — Progress Notes (Signed)
   08/30/18 0153  Vitals  Pulse Rate 91  ECG Heart Rate 87  Resp (!) 23  Oxygen Therapy  SpO2 95 %  O2 Device HFNC  Heater temperature 86.4 F (30.2 C)  O2 Flow Rate (L/min) 10 L/min  FiO2 (%) 30 %    Patient stated previous settings of 40%/20L was too powerful. Turned down to 30%/10L. Will notify respiratory therapy.

## 2018-08-30 NOTE — Progress Notes (Signed)
Inpatient Diabetes Program Recommendations  AACE/ADA: New Consensus Statement on Inpatient Glycemic Control (2015)  Target Ranges:  Prepandial:   less than 140 mg/dL      Peak postprandial:   less than 180 mg/dL (1-2 hours)      Critically ill patients:  140 - 180 mg/dL   Lab Results  Component Value Date   GLUCAP 344 (H) 08/30/2018   HGBA1C 12.0 (H) 08/29/2018    Review of Glycemic Control  Diabetes history: DM2 Outpatient Diabetes medications: Lantus 50 units bid + Novolog 15 units tid meal coverage Current orders for Inpatient glycemic control:U500 95 units bid + Nov 20 units tid meal coverage + Novolog correction resistant q 4 hrs.  Inpatient Diabetes Program Recommendations:   Sent secure chat to Dr. [facility] to discuss insulin regimen of switch to U 500 insulin and need to D/C Novolog meal coverage tid. Spoke with pt via phone about A1C results 12.0 (average CBG 258 over the past 2-3 months) and explained what an A1C is, basic pathophysiology of DM Type 2, basic home care, basic diabetes diet nutrition principles, importance of checking CBGs and maintaining good CBG control to prevent long-term and short-term complications. Reviewed signs and symptoms of hyperglycemia and hypoglycemia and how to treat hypoglycemia at home. Also reviewed blood sugar goals at home.  RNs to provide ongoing basic DM education at bedside with this patient. Have ordered educational booklet and DM videos.  Patient states she has been taking her insulin as prescribed but didn't have a meter to check CBGs with until recently. Now has a working meter and willing to take CBGs regularly @ home @ D/C.  Thank you, Nani Gasser. Asani Mcburney, RN, MSN, CDE  Diabetes Coordinator Inpatient Glycemic Control Team Team Pager [phone] (8am-5pm) 08/30/2018 3:05 PM

## 2018-08-30 NOTE — Progress Notes (Signed)
Patient walked one and a half laps around the unit. SPO2 dropped to 89% but quickly recovered with breaks. Patient returned to room on room air satting 100%.    This RN has consulted with respiratory regarding what can be done since patient wears CPAP HS at home. Will continue to closely monitor patient.

## 2018-08-31 LAB — COMPREHENSIVE METABOLIC PANEL
ALT: 19 U/L (ref 0–44)
AST: 16 U/L (ref 15–41)
Albumin: 3.2 g/dL — ABNORMAL LOW (ref 3.5–5.0)
Alkaline Phosphatase: 42 U/L (ref 38–126)
Anion gap: 11 (ref 5–15)
BUN: 18 mg/dL (ref 6–20)
CO2: 24 mmol/L (ref 22–32)
Calcium: 8.8 mg/dL — ABNORMAL LOW (ref 8.9–10.3)
Chloride: 99 mmol/L (ref 98–111)
Creatinine, Ser: 0.62 mg/dL (ref 0.44–1.00)
GFR calc Af Amer: >60 mL/min (ref >60)
GFR calc non Af Amer: >60 mL/min (ref >60)
Glucose, Bld: 322 mg/dL — ABNORMAL HIGH (ref 70–99)
Potassium: 4.6 mmol/L (ref 3.5–5.1)
Sodium: 134 mmol/L — ABNORMAL LOW (ref 135–145)
Total Bilirubin: 0.4 mg/dL (ref 0.3–1.2)
Total Protein: 7.6 g/dL (ref 6.5–8.1)

## 2018-08-31 LAB — CBC WITH DIFFERENTIAL/PLATELET
Abs Immature Granulocytes: 0.05 10*3/uL (ref 0.00–0.07)
Basophils Absolute: 0 10*3/uL (ref 0.0–0.1)
Basophils Relative: 0 %
Eosinophils Absolute: 0 10*3/uL (ref 0.0–0.5)
Eosinophils Relative: 0 %
HCT: 38.2 % (ref 36.0–46.0)
Hemoglobin: 11.5 g/dL — ABNORMAL LOW (ref 12.0–15.0)
Immature Granulocytes: 0 %
Lymphocytes Relative: 11 %
Lymphs Abs: 1.5 10*3/uL (ref 0.7–4.0)
MCH: 24.9 pg — ABNORMAL LOW (ref 26.0–34.0)
MCHC: 30.1 g/dL (ref 30.0–36.0)
MCV: 82.9 fL (ref 80.0–100.0)
Monocytes Absolute: 0.4 10*3/uL (ref 0.1–1.0)
Monocytes Relative: 3 %
Neutro Abs: 11.9 10*3/uL — ABNORMAL HIGH (ref 1.7–7.7)
Neutrophils Relative %: 86 %
Platelets: 321 10*3/uL (ref 150–400)
RBC: 4.61 MIL/uL (ref 3.87–5.11)
RDW: 16.9 % — ABNORMAL HIGH (ref 11.5–15.5)
WBC: 13.8 10*3/uL — ABNORMAL HIGH (ref 4.0–10.5)
nRBC: 0 % (ref 0.0–0.2)

## 2018-08-31 LAB — GLUCOSE, CAPILLARY
Glucose-Capillary: 233 mg/dL — ABNORMAL HIGH (ref 70–99)
Glucose-Capillary: 288 mg/dL — ABNORMAL HIGH (ref 70–99)
Glucose-Capillary: 306 mg/dL — ABNORMAL HIGH (ref 70–99)
Glucose-Capillary: 328 mg/dL — ABNORMAL HIGH (ref 70–99)
Glucose-Capillary: 340 mg/dL — ABNORMAL HIGH (ref 70–99)
Glucose-Capillary: 369 mg/dL — ABNORMAL HIGH (ref 70–99)
Glucose-Capillary: 420 mg/dL — ABNORMAL HIGH (ref 70–99)

## 2018-08-31 LAB — D-DIMER, QUANTITATIVE: D-Dimer, Quant: <0.27 ug/mL-FEU (ref 0.00–0.50)

## 2018-08-31 LAB — MAGNESIUM: Magnesium: 1.6 mg/dL — ABNORMAL LOW (ref 1.7–2.4)

## 2018-08-31 LAB — C-REACTIVE PROTEIN: CRP: 8.4 mg/dL — ABNORMAL HIGH (ref <1.0)

## 2018-08-31 LAB — FERRITIN: Ferritin: 112 ng/mL (ref 11–307)

## 2018-08-31 MED ORDER — INSULIN REGULAR HUMAN (CONC) 500 UNIT/ML ~~LOC~~ SOPN
100.0000 [IU] | PEN_INJECTOR | Freq: Two times a day (BID) | SUBCUTANEOUS | Status: DC
  Administered 2018-08-31: 18:00:00 100 [IU] via SUBCUTANEOUS
  Filled 2018-08-31: qty 3

## 2018-08-31 MED ORDER — INSULIN REGULAR HUMAN (CONC) 500 UNIT/ML ~~LOC~~ SOPN
110.0000 [IU] | PEN_INJECTOR | Freq: Two times a day (BID) | SUBCUTANEOUS | Status: DC
  Administered 2018-08-31: 09:00:00 100 [IU] via SUBCUTANEOUS
  Filled 2018-08-31: qty 3

## 2018-08-31 MED ORDER — POLYETHYLENE GLYCOL 3350 17 G PO PACK
34.0000 g | PACK | Freq: Once | ORAL | Status: AC
  Administered 2018-08-31: 34 g via ORAL
  Filled 2018-08-31: qty 2

## 2018-08-31 MED ORDER — INSULIN ASPART 100 UNIT/ML ~~LOC~~ SOLN
10.0000 [IU] | Freq: Three times a day (TID) | SUBCUTANEOUS | Status: DC
  Administered 2018-08-31 – 2018-09-02 (×6): 10 [IU] via SUBCUTANEOUS

## 2018-08-31 NOTE — Progress Notes (Signed)
CCMD called, patient had a 2.47 second pause on the monitor causing heart rate to read at 37 bpm

## 2018-08-31 NOTE — Progress Notes (Signed)
Patient CBG 233 this morning, MD Elgergawy at bedside and made aware, changes to sliding scale insulin made, and concentrated insulin made per MD orders. Will continue to monitor closely.

## 2018-08-31 NOTE — Progress Notes (Signed)
Pt set up with heated HFNC for the night to use as CPAP substitute with settings of 10L and 40%.

## 2018-08-31 NOTE — Progress Notes (Signed)
PROGRESS NOTE                                                                                                                                                                                                             Patient Demographics:    Ana Long, is a 43 y.o. female, DOB - 09-Jan-1976, RKY:[id number]  Admit date - 08/28/2018   Admitting Physician Toy Baker, MD  Outpatient Primary MD for the patient is Willeen Niece, [place]  LOS - 2   Chief Complaint  Patient presents with   Shortness of Breath       Brief Narrative    43 y.o. female with medical history significant of Asthma, DM2, GERD, Obesity, RA, OSA on CPAP, herpes, she presents with complaints of shortness of breath for last 5 days, kids tested positive for COVID-19 3 days ago, tested positive for COVID-19 in ED, she transferred to [facility] for further management.   Subjective:    Ana Long today denies any nausea or vomiting, reports constipation, reports dyspnea as well, ports she slept well overnight on heated high flow nasal cannula .   Assessment  & Plan :    Active Problems:   Uncontrolled type 2 diabetes mellitus with hyperglycemia (HCC)   Hyperlipidemia   Morbid obesity with BMI of 50 to 60   GERD   Asthma   Hypersomnia with sleep apnea   OSA on CPAP   Pneumonia due to COVID-19 virus   COVID-19 virus infection  Acute hypoxic respiratory failure due to COVID-19 pneumonia -Patient initially hypoxic, saturating in the 80s in room air presentation, required nasal cannula, currently she has been weaned off oxygen, but still requiring heated flow nasal cannula overnight to replace her CPAP during hospital stay . -Patient was encouraged to use incentive spirometry today, and to prone in her sides if she can tolerate . -Continue to monitor inflammatory markers closely, RP trending down which is reassuring, D-dimers has normalized as well . -Continue with IV  steroids -Continue with IV Remdesivir -No indication for Actemra or plasma currently. -May be some mild asthma exacerbation and COPD  contributing to her hypoxia as well  COVID-19 Labs  Recent Labs    08/28/18 2346 08/30/18 0318 08/31/18 0500  DDIMER 1.50* 0.39 <0.27  FERRITIN 86 114 112  LDH 197*  --   --  CRP 12.7* 16.1* 8.4*    Lab Results  Component Value Date   SARSCOV2NAA POSITIVE (A) 08/28/2018   Diabetes mellitus -CBG remains very poorly controlled, her A1c is 12 which is indicative of average CBG around 300 . -CBG extremely labile labile, elevated, poorly controlled despite being on 95 units of Lantus twice daily, she was transitioned to U500 during her hospital stay, as well she is on insulin sliding scale, and pre-meal coverage, which I have lowered today.  Hyperlipidemia -Continue with statin  GERD -Continue with home meds  Asthma -She had some scattered wheezing , changed her steroids to IV  OSA -COVID-19, we cannot use CPAP currently, explained this to her, likely her CPAP contributing to her hypoxia as it is more profound when she lays flat.  He is requiring heated high flow nasal cannula overnight.  Hypomagnesemia -Repleated  Code Status : Full  Family Communication  : D/W patient  Disposition Plan  : home  Barriers For Discharge : she remains  on IV steroids and IV remdesiivir.  Consults  : None  Procedures  : None  DVT Prophylaxis  : [place] lovenox  Lab Results  Component Value Date   PLT 321 08/31/2018    Antibiotics  :    Anti-infectives (From admission, onward)   Start     Dose/Rate Route Frequency Ordered Stop   08/30/18 1800  remdesivir 100 mg in sodium chloride 0.9 % 250 mL IVPB     100 mg 500 mL/hr over 30 Minutes Intravenous Every 24 hours 08/30/18 1700 09/02/18 1759   08/30/18 0800  remdesivir 100 mg in sodium chloride 0.9 % 250 mL IVPB  Status:  Discontinued     100 mg 500 mL/hr over 30 Minutes Intravenous Every 24 hours  08/29/18 0533 08/30/18 1700   08/29/18 0600  remdesivir 200 mg in sodium chloride 0.9 % 250 mL IVPB     200 mg 500 mL/hr over 30 Minutes Intravenous Once 08/29/18 0533 08/29/18 0700   08/28/18 2330  levofloxacin (LEVAQUIN) IVPB 750 mg     750 mg 100 mL/hr over 90 Minutes Intravenous  Once 08/28/18 2319 08/29/18 0253        Objective:   Vitals:   08/31/18 0500 08/31/18 0600 08/31/18 0730 08/31/18 0813  BP:    131/82  Pulse: 90 (!) 51 (!) 56 85  Resp: 20 (!) 21 15 19   Temp:    97.8 F (36.6 C)  TempSrc:    Oral  SpO2: 100% 98% 100% 100%  Weight:      Height:        Wt Readings from Last 3 Encounters:  08/28/18 (!) 140.2 kg  07/24/18 (!) 146.5 kg  06/06/18 (!) 143.3 kg     Intake/Output Summary (Last 24 hours) at 08/31/2018 1132 Last data filed at 08/31/2018 0600 Gross per 24 hour  Intake 840 ml  Output 2 ml  Net 838 ml     Physical Exam  Awake Alert, Oriented X 3, No new F.N deficits, Normal affect Symmetrical Chest wall movement, Good air movement bilaterally, minimal wheezing RRR,No Gallops,Rubs or new Murmurs, No Parasternal Heave +ve B.Sounds, Abd Soft, No tenderness, No rebound - guarding or rigidity. No Cyanosis, Clubbing or edema, No new Rash or bruise       Data Review:    CBC Recent Labs  Lab 08/28/18 2003 08/30/18 0318 08/31/18 0500  WBC 5.0 8.3 13.8*  HGB 12.0 11.9* 11.5*  HCT 40.9 40.3 38.2  PLT 110*  293 321  MCV 83.0 82.9 82.9  MCH 24.3* 24.5* 24.9*  MCHC 29.3* 29.5* 30.1  RDW 16.6* 16.5* 16.9*  LYMPHSABS 1.3 1.0 1.5  MONOABS 0.2 0.2 0.4  EOSABS 0.0 0.0 0.0  BASOSABS 0.0 0.0 0.0    Chemistries  Recent Labs  Lab 08/28/18 2003 08/29/18 0004 08/29/18 0550 08/29/18 1300 08/30/18 0318 08/31/18 0500  NA 134*  --   --   --  136 134*  K 4.3  --   --   --  4.9 4.6  CL 100  --   --   --  102 99  CO2 25  --   --   --  25 24  GLUCOSE 208*  --  350* 411* 371* 322*  BUN 9  --   --   --  17 18  CREATININE 0.61  --   --   --  0.67 0.62    CALCIUM 8.3*  --   --   --  9.0 8.8*  MG  --   --   --   --  1.9 1.6*  AST  --  19  --   --  15 16  ALT  --  23  --   --  22 19  ALKPHOS  --  36*  --   --  42 42  BILITOT  --  0.3  --   --  0.3 0.4   ------------------------------------------------------------------------------------------------------------------ No results for input(s): CHOL, HDL, LDLCALC, TRIG, CHOLHDL, LDLDIRECT in the last 72 hours.  Lab Results  Component Value Date   HGBA1C 12.0 (H) 08/29/2018   ------------------------------------------------------------------------------------------------------------------ No results for input(s): TSH, T4TOTAL, T3FREE, THYROIDAB in the last 72 hours.  Invalid input(s): FREET3 ------------------------------------------------------------------------------------------------------------------ Recent Labs    08/30/18 0318 08/31/18 0500  FERRITIN 114 112    Coagulation profile No results for input(s): INR, PROTIME in the last 168 hours.  Recent Labs    08/30/18 0318 08/31/18 0500  DDIMER 0.39 <0.27    Cardiac Enzymes No results for input(s): CKMB, TROPONINI, MYOGLOBIN in the last 168 hours.  Invalid input(s): CK ------------------------------------------------------------------------------------------------------------------    Component Value Date/Time   BNP 15.7 01/11/2018 1435    Inpatient Medications  Scheduled Meds:  albuterol  2 puff Inhalation Q6H   atorvastatin  40 mg Oral QHS   enoxaparin (LOVENOX) injection  70 mg Subcutaneous Q24H   insulin aspart  0-20 Units Subcutaneous Q4H   insulin aspart  10 Units Subcutaneous TID WC   insulin regular human CONCENTRATED  100 Units Subcutaneous BID WC   methylPREDNISolone (SOLU-MEDROL) injection  40 mg Intravenous Q12H   mometasone-formoterol  2 puff Inhalation BID   montelukast  10 mg Oral QHS   pantoprazole  40 mg Oral Daily   polyethylene glycol  34 g Oral Once   sodium chloride flush  3 mL  Intravenous Q12H   sodium chloride flush  3 mL Intravenous Q12H   vitamin C  500 mg Oral Daily   zinc sulfate  220 mg Oral Daily   Continuous Infusions:  sodium chloride     remdesivir 100 mg in NS 250 mL 100 mg (08/30/18 1807)   PRN Meds:.sodium chloride, acetaminophen, benzonatate, loratadine, ondansetron **OR** ondansetron (ZOFRAN) IV, sodium chloride flush  Micro Results Recent Results (from the past 240 hour(s))  SARS Coronavirus 2 (CEPHEID- Performed in [place] hospital lab), Hosp Order     Status: Abnormal   Collection Time: 08/28/18  8:03 PM   Specimen: Nasopharyngeal Swab  Result Value Ref Range Status   SARS Coronavirus 2 POSITIVE (A) NEGATIVE Final    Comment: CRITICAL RESULT CALLED TO, READ BACK BY AND VERIFIED WITH: RN Vernon Prey AT 2304 08/28/18 CRU[place]ANK A (NOTE) If result is NEGATIVE SARS-CoV-2 target nucleic acids are NOT DETECTED. The SARS-CoV-2 RNA is generally detectable in upper and lower  respiratory specimens during the acute phase of infection. The lowest  concentration of SARS-CoV-2 viral copies this assay can detect is 250  copies / mL. A negative result does not preclude SARS-CoV-2 infection  and should not be used as the sole basis for treatment or other  patient management decisions.  A negative result may occur with  improper specimen collection / handling, submission of specimen other  than nasopharyngeal swab, presence of viral mutation(s) within the  areas targeted by this assay, and inadequate number of viral copies  (<250 copies / mL). A negative result must be combined with clinical  observations, patient history, and epidemiological information. If result is POSITIVE SARS-CoV-2 target nucleic acids ar e DETECTED. The SARS-CoV-2 RNA is generally detectable in upper and lower  respiratory specimens during the acute phase of infection.  Positive  results are indicative of active infection with SARS-CoV-2.  Clinical  correlation with  patient history and other diagnostic information is  necessary to determine patient infection status.  Positive results do  not rule out bacterial infection or co-infection with other viruses. If result is PRESUMPTIVE POSTIVE SARS-CoV-2 nucleic acids MAY BE PRESENT.   A presumptive positive result was obtained on the submitted specimen  and confirmed on repeat testing.  While 2019 novel coronavirus  (SARS-CoV-2) nucleic acids may be present in the submitted sample  additional confirmatory testing may be necessary for epidemiological  and / or clinical management purposes  to differentiate between  SARS-CoV-2 and other Sarbecovirus currently known to infect humans.  If clinically indicated additional testing with an alternate test  methodolog y [phone]) is advised. The SARS-CoV-2 RNA is generally  detectable in upper and lower respiratory specimens during the acute  phase of infection. The expected result is Negative. Fact Sheet for Patients:  StrictlyIdeas.no Fact Sheet for Healthcare Providers: BankingDealers.co.za This test is not yet approved or cleared by the [place] FDA and has been authorized for detection and/or diagnosis of SARS-CoV-2 by FDA under an Emergency Use Authorization (EUA).  This EUA will remain in effect (meaning this test can be used) for the duration of the COVID-19 declaration under Section 564(b)(1) of the Act, 21 U.S.C. section 360bbb-3(b)(1), unless the authorization is terminated or revoked sooner. Performed at [facility], [place] [address], [place], [place] [address]   Culture, blood (Routine X 2) w Reflex to ID Panel     Status: None (Preliminary result)   Collection Time: 08/29/18  5:50 AM   Specimen: BLOOD  Result Value Ref Range Status   Specimen Description   Final    BLOOD RIGHT ANTECUBITAL Performed at [facility], [place] [address] [place], [place] [address]    Special  Requests   Final    BOTTLES DRAWN AEROBIC ONLY Blood Culture adequate volume Performed at [place] [address], [place], [place] [address]    Culture   Final    NO GROWTH 1 DAY Performed at [facility], [place] [address], [place], [place] [address]    Report Status PENDING  Incomplete  Culture, blood (Routine X 2) w Reflex to ID Panel  Status: None (Preliminary result)   Collection Time: 08/29/18  5:50 AM   Specimen: BLOOD  Result Value Ref Range Status   Specimen Description   Final    BLOOD LEFT ANTECUBITAL Performed at [facility], [place] [address], [place], [place] [address]    Special Requests   Final    BOTTLES DRAWN AEROBIC ONLY Blood Culture adequate volume Performed at [place] [address], [place], [place] [address]    Culture   Final    NO GROWTH 1 DAY Performed at [facility], [place] [address], [place] [employer] [place], [place] [address]    Report Status PENDING  Incomplete    Radiology Reports Mr Shoulder Right Wo Contrast  Result Date: 08/04/2018 CLINICAL DATA:  Chronic shoulder pain and 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 EXAM: MRI OF THE RIGHT SHOULDER WITHOUT CONTRAST TECHNIQUE: Multiplanar, multisequence MR imaging of the shoulder was performed. No intravenous contrast was administered. COMPARISON:  Right shoulder x-rays dated November 22, 2017. FINDINGS: Rotator cuff: Supraspinatus tendinosis with high-grade partial-thickness articular surface tear involving the entire distal tendon, measuring 2.3 cm in AP dimension. Distal infraspinatus tendinosis with bursal surface fraying. 2.3 x 2.0 x 0.6 cm multiloculated ganglion cyst along the infraspinatus myotendinous junction. The subscapularis and teres minor tendons are unremarkable. Muscles: No atrophy or abnormal signal of the muscles of the rotator cuff. Biceps long head: Intact and normally positioned. Severe intra-articular  tendinosis. Acromioclavicular Joint: Normal acromioclavicular joint. Type II acromion. Trace subacromial/subdeltoid bursal fluid. Glenohumeral Joint: Small joint effusion. Focal full-thickness cartilage loss along the inferior glenoid with subchondral marrow edema and small geode. Small humeral head marginal osteophyte. Labrum:  Torn superior labrum. Bones: No acute fracture or dislocation. No suspicious bone lesion. Other: None. IMPRESSION: 1. Supraspinatus tendinosis with high-grade partial-thickness articular surface tear involving the entire distal tendon. 2. Distal infraspinatus tendinosis with bursal surface fraying. 2.3 cm ganglion cyst along the infraspinatus myotendinous junction. 3. Severe intra-articular biceps tendinosis. 4. Superior labral tear. 5. Mild glenohumeral osteoarthritis with focal full-thickness cartilage loss over the inferior glenoid as described above. Electronically Signed   By: Titus Dubin M.D.   On: 08/04/2018 11:06   Dg Chest Port 1 View  Result Date: 08/28/2018 CLINICAL DATA:  Chest pain.  Fever. EXAM: PORTABLE CHEST 1 VIEW COMPARISON:  September 29, 2017 FINDINGS: The heart size is mildly enlarged. There are multifocal airspace opacities bilaterally. No pneumothorax. No large pleural effusion, however there is elevation of the right hemidiaphragm which limits detection of small pleural effusions. There is no acute osseous abnormality. IMPRESSION: Low lung volumes with multifocal airspace opacities concerning for multifocal pneumonia (viral or bacterial) or developing pulmonary edema. Electronically Signed   By: Constance Holster M.D.   On: 08/28/2018 19:50    Time Spent in minutes  25 minutes   Phillips Climes M.D on 08/31/2018 at 11:32 AM  Between 7am to 7pm - Pager - [phone]  After 7pm go to [email or web address] - password [facility]  [facility] -  Office  [phone]

## 2018-08-31 NOTE — Progress Notes (Signed)
Patient called significant other with her own update, she did not want me to call.

## 2018-09-01 LAB — CBC WITH DIFFERENTIAL/PLATELET
Abs Immature Granulocytes: 0.1 10*3/uL — ABNORMAL HIGH (ref 0.00–0.07)
Basophils Absolute: 0 10*3/uL (ref 0.0–0.1)
Basophils Relative: 0 %
Eosinophils Absolute: 0 10*3/uL (ref 0.0–0.5)
Eosinophils Relative: 0 %
HCT: 41.2 % (ref 36.0–46.0)
Hemoglobin: 12.5 g/dL (ref 12.0–15.0)
Immature Granulocytes: 1 %
Lymphocytes Relative: 18 %
Lymphs Abs: 2 10*3/uL (ref 0.7–4.0)
MCH: 25 pg — ABNORMAL LOW (ref 26.0–34.0)
MCHC: 30.3 g/dL (ref 30.0–36.0)
MCV: 82.2 fL (ref 80.0–100.0)
Monocytes Absolute: 0.5 10*3/uL (ref 0.1–1.0)
Monocytes Relative: 5 %
Neutro Abs: 8.5 10*3/uL — ABNORMAL HIGH (ref 1.7–7.7)
Neutrophils Relative %: 76 %
Platelets: 350 10*3/uL (ref 150–400)
RBC: 5.01 MIL/uL (ref 3.87–5.11)
RDW: 16.8 % — ABNORMAL HIGH (ref 11.5–15.5)
WBC: 11.1 10*3/uL — ABNORMAL HIGH (ref 4.0–10.5)
nRBC: 0 % (ref 0.0–0.2)

## 2018-09-01 LAB — URINALYSIS, ROUTINE W REFLEX MICROSCOPIC
Bilirubin Urine: NEGATIVE
Glucose, UA: >=500 mg/dL — AB
Hgb urine dipstick: NEGATIVE
Ketones, ur: NEGATIVE mg/dL
Leukocytes,Ua: NEGATIVE
Nitrite: NEGATIVE
Protein, ur: NEGATIVE mg/dL
Specific Gravity, Urine: 1.018 (ref 1.005–1.030)
pH: 5 (ref 5.0–8.0)

## 2018-09-01 LAB — D-DIMER, QUANTITATIVE: D-Dimer, Quant: 0.28 ug/mL-FEU (ref 0.00–0.50)

## 2018-09-01 LAB — COMPREHENSIVE METABOLIC PANEL
ALT: 21 U/L (ref 0–44)
AST: 14 U/L — ABNORMAL LOW (ref 15–41)
Albumin: 3.2 g/dL — ABNORMAL LOW (ref 3.5–5.0)
Alkaline Phosphatase: 46 U/L (ref 38–126)
Anion gap: 10 (ref 5–15)
BUN: 15 mg/dL (ref 6–20)
CO2: 27 mmol/L (ref 22–32)
Calcium: 9 mg/dL (ref 8.9–10.3)
Chloride: 101 mmol/L (ref 98–111)
Creatinine, Ser: 0.56 mg/dL (ref 0.44–1.00)
GFR calc Af Amer: >60 mL/min (ref >60)
GFR calc non Af Amer: >60 mL/min (ref >60)
Glucose, Bld: 234 mg/dL — ABNORMAL HIGH (ref 70–99)
Potassium: 3.7 mmol/L (ref 3.5–5.1)
Sodium: 138 mmol/L (ref 135–145)
Total Bilirubin: 0.1 mg/dL — ABNORMAL LOW (ref 0.3–1.2)
Total Protein: 7.3 g/dL (ref 6.5–8.1)

## 2018-09-01 LAB — GLUCOSE, CAPILLARY
Glucose-Capillary: 298 mg/dL — ABNORMAL HIGH (ref 70–99)
Glucose-Capillary: 408 mg/dL — ABNORMAL HIGH (ref 70–99)
Glucose-Capillary: 178 mg/dL — ABNORMAL HIGH (ref 70–99)
Glucose-Capillary: 232 mg/dL — ABNORMAL HIGH (ref 70–99)
Glucose-Capillary: 366 mg/dL — ABNORMAL HIGH (ref 70–99)
Glucose-Capillary: 307 mg/dL — ABNORMAL HIGH (ref 70–99)

## 2018-09-01 LAB — C-REACTIVE PROTEIN: CRP: 4.3 mg/dL — ABNORMAL HIGH (ref <1.0)

## 2018-09-01 LAB — MAGNESIUM: Magnesium: 1.5 mg/dL — ABNORMAL LOW (ref 1.7–2.4)

## 2018-09-01 LAB — FERRITIN: Ferritin: 79 ng/mL (ref 11–307)

## 2018-09-01 MED ORDER — INSULIN REGULAR HUMAN (CONC) 500 UNIT/ML ~~LOC~~ SOPN
120.0000 [IU] | PEN_INJECTOR | Freq: Every day | SUBCUTANEOUS | Status: DC
  Administered 2018-09-01 – 2018-09-02 (×2): 120 [IU] via SUBCUTANEOUS
  Filled 2018-09-01: qty 3

## 2018-09-01 MED ORDER — DEXAMETHASONE 6 MG PO TABS
6.0000 mg | ORAL_TABLET | Freq: Every day | ORAL | Status: DC
  Administered 2018-09-02: 6 mg via ORAL
  Filled 2018-09-01: qty 1

## 2018-09-01 MED ORDER — POLYETHYLENE GLYCOL 3350 17 G PO PACK
34.0000 g | PACK | Freq: Every day | ORAL | Status: DC
  Administered 2018-09-01: 34 g via ORAL
  Filled 2018-09-01 (×3): qty 2

## 2018-09-01 MED ORDER — INSULIN REGULAR HUMAN (CONC) 500 UNIT/ML ~~LOC~~ SOPN
95.0000 [IU] | PEN_INJECTOR | Freq: Every day | SUBCUTANEOUS | Status: DC
  Administered 2018-09-01 – 2018-09-02 (×2): 95 [IU] via SUBCUTANEOUS
  Filled 2018-09-01: qty 3

## 2018-09-01 NOTE — Progress Notes (Signed)
PROGRESS NOTE                                                                                                                                                                                                             Patient Demographics:    Ana Long, is a 42 y.o. female, DOB - March 18, 1975, LFY:[id number]  Admit date - 08/28/2018   Admitting Physician Toy Baker, MD  Outpatient Primary MD for the patient is Willeen Niece, [place]  LOS - 3   Chief Complaint  Patient presents with  . Shortness of Breath       Brief Narrative    43 y.o. female with medical history significant of Asthma, DM2, GERD, Obesity, RA, OSA on CPAP, herpes, she presents with complaints of shortness of breath for last 5 days, kids tested positive for COVID-19 3 days ago, tested positive for COVID-19 in ED, she is transferred to [facility] for further management.   Subjective:    Ana Long today she had a good night sleep, no nausea, no vomiting, good bowel movement yesterday with laxatives .   Assessment  & Plan :    Active Problems:   Uncontrolled type 2 diabetes mellitus with hyperglycemia (HCC)   Hyperlipidemia   Morbid obesity with BMI of 50 to 60   GERD   Asthma   Hypersomnia with sleep apnea   OSA on CPAP   Pneumonia due to COVID-19 virus   COVID-19 virus infection  Acute hypoxic respiratory failure due to COVID-19 pneumonia -Patient initially hypoxic, saturating in the 80s in room air presentation, required nasal cannula, currently she has been weaned off oxygen,on room air, but still requiring heated flow nasal cannula overnight to replace her CPAP during hospital stay . -Patient was encouraged to use incentive spirometry today, and to prone in her sides if she can tolerate . -Continue to monitor inflammatory markers closely, CRP trending down which is reassuring, D-dimers has normalized as well . -Continue with IV steroids -Continue with IV  Remdesivir -No indication for Actemra or plasma currently.  COVID-19 Labs  Recent Labs    08/30/18 0318 08/31/18 0500 09/01/18 0701  DDIMER 0.39 <0.27 0.28  FERRITIN 114 112 79  CRP 16.1* 8.4* 4.3*    Lab Results  Component Value Date   SARSCOV2NAA POSITIVE (A) 08/28/2018   Diabetes mellitus -CBG remains  very poorly controlled, her A1c is 12 which is indicative of average CBG around 300 . -CBG extremely labile labile, elevated, poorly controlled despite being on 95 units of Lantus twice daily, she was transitioned to U500 during her hospital . -Have discussed with the patient, she will need to increase her Lantus on discharge, she will be discharged on Lantus 80 units twice daily .  Hyperlipidemia -Continue with statin  GERD -Continue with home meds  Asthma -No further wheezing, will DC IV steroids.  OSA -COVID-19, we cannot use CPAP currently, explained this to her, likely her CPAP contributing to her hypoxia as it is more profound when she lays flat.  She is requiring heated high flow nasal cannula overnight.  Hypomagnesemia -Repleated  Code Status : Full  Family Communication  : D/W patient  Disposition Plan  : home  Barriers For Discharge : she remains  on IV steroids and IV remdesiivir.  Consults  : None  Procedures  : None  DVT Prophylaxis  : [place] lovenox  Lab Results  Component Value Date   PLT 350 09/01/2018    Antibiotics  :    Anti-infectives (From admission, onward)   Start     Dose/Rate Route Frequency Ordered Stop   08/30/18 1800  remdesivir 100 mg in sodium chloride 0.9 % 250 mL IVPB     100 mg 500 mL/hr over 30 Minutes Intravenous Every 24 hours 08/30/18 1700 09/03/18 1759   08/30/18 0800  remdesivir 100 mg in sodium chloride 0.9 % 250 mL IVPB  Status:  Discontinued     100 mg 500 mL/hr over 30 Minutes Intravenous Every 24 hours 08/29/18 0533 08/30/18 1700   08/29/18 0600  remdesivir 200 mg in sodium chloride 0.9 % 250 mL IVPB     200  mg 500 mL/hr over 30 Minutes Intravenous Once 08/29/18 0533 08/29/18 0700   08/28/18 2330  levofloxacin (LEVAQUIN) IVPB 750 mg     750 mg 100 mL/hr over 90 Minutes Intravenous  Once 08/28/18 2319 08/29/18 0253        Objective:   Vitals:   08/31/18 2126 09/01/18 0420 09/01/18 0457 09/01/18 0836  BP: 131/75  124/78 108/78  Pulse:   77 (!) 169  Resp:   20 (!) 22  Temp: 98.7 F (37.1 C)  97.7 F (36.5 C) 97.9 F (36.6 C)  TempSrc: Oral  Oral Oral  SpO2:  99% 100% 100%  Weight:      Height:        Wt Readings from Last 3 Encounters:  08/28/18 (!) 140.2 kg  07/24/18 (!) 146.5 kg  06/06/18 (!) 143.3 kg     Intake/Output Summary (Last 24 hours) at 09/01/2018 1537 Last data filed at 09/01/2018 1235 Gross per 24 hour  Intake 6 ml  Output 1600 ml  Net -1594 ml     Physical Exam  Awake Alert, Oriented X 3, No new F.N deficits, Normal affect Symmetrical Chest wall movement, Good air movement bilaterally, CTAB RRR,No Gallops,Rubs or new Murmurs, No Parasternal Heave +ve B.Sounds, Abd Soft, No tenderness, No rebound - guarding or rigidity. No Cyanosis, Clubbing or edema, No new Rash or bruise        Data Review:    CBC Recent Labs  Lab 08/28/18 2003 08/30/18 0318 08/31/18 0500 09/01/18 0701  WBC 5.0 8.3 13.8* 11.1*  HGB 12.0 11.9* 11.5* 12.5  HCT 40.9 40.3 38.2 41.2  PLT 110* 293 321 350  MCV 83.0 82.9 82.9 82.2  MCH 24.3* 24.5* 24.9* 25.0*  MCHC 29.3* 29.5* 30.1 30.3  RDW 16.6* 16.5* 16.9* 16.8*  LYMPHSABS 1.3 1.0 1.5 2.0  MONOABS 0.2 0.2 0.4 0.5  EOSABS 0.0 0.0 0.0 0.0  BASOSABS 0.0 0.0 0.0 0.0    Chemistries  Recent Labs  Lab 08/28/18 2003 08/29/18 0004 08/29/18 0550 08/29/18 1300 08/30/18 0318 08/31/18 0500 09/01/18 0701  NA 134*  --   --   --  136 134* 138  K 4.3  --   --   --  4.9 4.6 3.7  CL 100  --   --   --  102 99 101  CO2 25  --   --   --  25 24 27   GLUCOSE 208*  --  350* 411* 371* 322* 234*  BUN 9  --   --   --  17 18 15    CREATININE 0.61  --   --   --  0.67 0.62 0.56  CALCIUM 8.3*  --   --   --  9.0 8.8* 9.0  MG  --   --   --   --  1.9 1.6* 1.5*  AST  --  19  --   --  15 16 14*  ALT  --  23  --   --  22 19 21   ALKPHOS  --  36*  --   --  42 42 46  BILITOT  --  0.3  --   --  0.3 0.4 0.1*   ------------------------------------------------------------------------------------------------------------------ No results for input(s): CHOL, HDL, LDLCALC, TRIG, CHOLHDL, LDLDIRECT in the last 72 hours.  Lab Results  Component Value Date   HGBA1C 12.0 (H) 08/29/2018   ------------------------------------------------------------------------------------------------------------------ No results for input(s): TSH, T4TOTAL, T3FREE, THYROIDAB in the last 72 hours.  Invalid input(s): FREET3 ------------------------------------------------------------------------------------------------------------------ Recent Labs    08/31/18 0500 09/01/18 0701  FERRITIN 112 79    Coagulation profile No results for input(s): INR, PROTIME in the last 168 hours.  Recent Labs    08/31/18 0500 09/01/18 0701  DDIMER <0.27 0.28    Cardiac Enzymes No results for input(s): CKMB, TROPONINI, MYOGLOBIN in the last 168 hours.  Invalid input(s): CK ------------------------------------------------------------------------------------------------------------------    Component Value Date/Time   BNP 15.7 01/11/2018 1435    Inpatient Medications  Scheduled Meds: . albuterol  2 puff Inhalation Q6H  . atorvastatin  40 mg Oral QHS  . enoxaparin (LOVENOX) injection  70 mg Subcutaneous Q24H  . insulin aspart  0-20 Units Subcutaneous Q4H  . insulin aspart  10 Units Subcutaneous TID WC  . insulin regular human CONCENTRATED  120 Units Subcutaneous Q breakfast  . insulin regular human CONCENTRATED  95 Units Subcutaneous Q supper  . methylPREDNISolone (SOLU-MEDROL) injection  40 mg Intravenous Q12H  . mometasone-formoterol  2 puff  Inhalation BID  . montelukast  10 mg Oral QHS  . pantoprazole  40 mg Oral Daily  . sodium chloride flush  3 mL Intravenous Q12H  . sodium chloride flush  3 mL Intravenous Q12H  . vitamin C  500 mg Oral Daily  . zinc sulfate  220 mg Oral Daily   Continuous Infusions: . sodium chloride    . remdesivir 100 mg in NS 250 mL Stopped (08/31/18 1940)   PRN Meds:.sodium chloride, acetaminophen, benzonatate, loratadine, ondansetron **OR** ondansetron (ZOFRAN) IV, sodium chloride flush  Micro Results Recent Results (from the past 240 hour(s))  SARS Coronavirus 2 (CEPHEID- Performed in [place] hospital lab), [facility]  Status: Abnormal   Collection Time: 08/28/18  8:03 PM   Specimen: Nasopharyngeal Swab  Result Value Ref Range Status   SARS Coronavirus 2 POSITIVE (A) NEGATIVE Final    Comment: CRITICAL RESULT CALLED TO, READ BACK BY AND VERIFIED WITH: RN Vernon Prey AT 2304 08/28/18 CRUICKSHANK A (NOTE) If result is NEGATIVE SARS-CoV-2 target nucleic acids are NOT DETECTED. The SARS-CoV-2 RNA is generally detectable in upper and lower  respiratory specimens during the acute phase of infection. The lowest  concentration of SARS-CoV-2 viral copies this assay can detect is 250  copies / mL. A negative result does not preclude SARS-CoV-2 infection  and should not be used as the sole basis for treatment or other  patient management decisions.  A negative result may occur with  improper specimen collection / handling, submission of specimen other  than nasopharyngeal swab, presence of viral mutation(s) within the  areas targeted by this assay, and inadequate number of viral copies  (<250 copies / mL). A negative result must be combined with clinical  observations, patient history, and epidemiological information. If result is POSITIVE SARS-CoV-2 target nucleic acids ar e DETECTED. The SARS-CoV-2 RNA is generally detectable in upper and lower  respiratory specimens during the acute phase of  infection.  Positive  results are indicative of active infection with SARS-CoV-2.  Clinical  correlation with patient history and other diagnostic information is  necessary to determine patient infection status.  Positive results do  not rule out bacterial infection or co-infection with other viruses. If result is PRESUMPTIVE POSTIVE SARS-CoV-2 nucleic acids MAY BE PRESENT.   A presumptive positive result was obtained on the submitted specimen  and confirmed on repeat testing.  While 2019 novel coronavirus  (SARS-CoV-2) nucleic acids may be present in the submitted sample  additional confirmatory testing may be necessary for epidemiological  and / or clinical management purposes  to differentiate between  SARS-CoV-2 and other Sarbecovirus currently known to infect humans.  If clinically indicated additional testing with an alternate test  methodolog y [phone]) is advised. The SARS-CoV-2 RNA is generally  detectable in upper and lower respiratory specimens during the acute  phase of infection. The expected result is Negative. Fact Sheet for Patients:  StrictlyIdeas.no Fact Sheet for Healthcare Providers: BankingDealers.co.za This test is not yet approved or cleared by the [place] FDA and has been authorized for detection and/or diagnosis of SARS-CoV-2 by FDA under an Emergency Use Authorization (EUA).  This EUA will remain in effect (meaning this test can be used) for the duration of the COVID-19 declaration under Section 564(b)(1) of the Act, 21 U.S.C. section 360bbb-3(b)(1), unless the authorization is terminated or revoked sooner. Performed at [facility], [place] [address] [place], [place] [address]   Culture, blood (Routine X 2) w Reflex to ID Panel     Status: None (Preliminary result)   Collection Time: 08/29/18  5:50 AM   Specimen: BLOOD  Result Value Ref Range Status   Specimen Description   Final     BLOOD RIGHT ANTECUBITAL Performed at [facility], [place] [address] [place], [place] [address]    Special Requests   Final    BOTTLES DRAWN AEROBIC ONLY Blood Culture adequate volume Performed at [place] [address], [place], [place] [address]    Culture   Final    NO GROWTH 2 DAYS Performed at [place] [address] [place],  [address]    Report  Status PENDING  Incomplete  Culture, blood (Routine X 2) w Reflex to ID Panel     Status: None (Preliminary result)   Collection Time: 08/29/18  5:50 AM   Specimen: BLOOD  Result Value Ref Range Status   Specimen Description   Final    BLOOD LEFT ANTECUBITAL Performed at [facility], [place] [address], [place], [place] [address]    Special Requests   Final    BOTTLES DRAWN AEROBIC ONLY Blood Culture adequate volume Performed at [place] [address], [place], [place] [address]    Culture   Final    NO GROWTH 2 DAYS Performed at [place] [address] [place], [place] [address]    Report Status PENDING  Incomplete    Radiology Reports Mr Shoulder Right Wo Contrast  Result Date: 08/04/2018 CLINICAL DATA:  Chronic shoulder pain and 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 EXAM: MRI OF THE RIGHT SHOULDER WITHOUT CONTRAST TECHNIQUE: Multiplanar, multisequence MR imaging of the shoulder was performed. No intravenous contrast was administered. COMPARISON:  Right shoulder x-rays dated November 22, 2017. FINDINGS: Rotator cuff: Supraspinatus tendinosis with high-grade partial-thickness articular surface tear involving the entire distal tendon, measuring 2.3 cm in AP dimension. Distal infraspinatus tendinosis with bursal surface fraying. 2.3 x 2.0 x 0.6 cm multiloculated ganglion cyst along the infraspinatus myotendinous junction. The subscapularis and teres minor tendons are unremarkable. Muscles: No atrophy or abnormal  signal of the muscles of the rotator cuff. Biceps long head: Intact and normally positioned. Severe intra-articular tendinosis. Acromioclavicular Joint: Normal acromioclavicular joint. Type II acromion. Trace subacromial/subdeltoid bursal fluid. Glenohumeral Joint: Small joint effusion. Focal full-thickness cartilage loss along the inferior glenoid with subchondral marrow edema and small geode. Small humeral head marginal osteophyte. Labrum:  Torn superior labrum. Bones: No acute fracture or dislocation. No suspicious bone lesion. Other: None. IMPRESSION: 1. Supraspinatus tendinosis with high-grade partial-thickness articular surface tear involving the entire distal tendon. 2. Distal infraspinatus tendinosis with bursal surface fraying. 2.3 cm ganglion cyst along the infraspinatus myotendinous junction. 3. Severe intra-articular biceps tendinosis. 4. Superior labral tear. 5. Mild glenohumeral osteoarthritis with focal full-thickness cartilage loss over the inferior glenoid as described above. Electronically Signed   By: Titus Dubin M.D.   On: 08/04/2018 11:06   Dg Chest Port 1 View  Result Date: 08/28/2018 CLINICAL DATA:  Chest pain.  Fever. EXAM: PORTABLE CHEST 1 VIEW COMPARISON:  September 29, 2017 FINDINGS: The heart size is mildly enlarged. There are multifocal airspace opacities bilaterally. No pneumothorax. No large pleural effusion, however there is elevation of the right hemidiaphragm which limits detection of small pleural effusions. There is no acute osseous abnormality. IMPRESSION: Low lung volumes with multifocal airspace opacities concerning for multifocal pneumonia (viral or bacterial) or developing pulmonary edema. Electronically Signed   By: Constance Holster M.D.   On: 08/28/2018 19:50    Time Spent in minutes  25 minutes   Phillips Climes M.D on 09/01/2018 at 3:37 PM  Between 7am to 7pm - Pager - [phone]  After 7pm go to [email or web address] - password [facility]  [facility] -   Office  [phone]

## 2018-09-01 NOTE — Progress Notes (Signed)
Pt set up with heated HFNC for the night to use as CPAP substitute with settings of 10L and 40%.

## 2018-09-02 LAB — GLUCOSE, CAPILLARY
Glucose-Capillary: 138 mg/dL — ABNORMAL HIGH (ref 70–99)
Glucose-Capillary: 266 mg/dL — ABNORMAL HIGH (ref 70–99)
Glucose-Capillary: 73 mg/dL (ref 70–99)

## 2018-09-02 LAB — CBC WITH DIFFERENTIAL/PLATELET
Abs Immature Granulocytes: 0.12 10*3/uL — ABNORMAL HIGH (ref 0.00–0.07)
Basophils Absolute: 0.1 10*3/uL (ref 0.0–0.1)
Basophils Relative: 1 %
Eosinophils Absolute: 0 10*3/uL (ref 0.0–0.5)
Eosinophils Relative: 0 %
HCT: 40.9 % (ref 36.0–46.0)
Hemoglobin: 12.4 g/dL (ref 12.0–15.0)
Immature Granulocytes: 1 %
Lymphocytes Relative: 37 %
Lymphs Abs: 4.7 10*3/uL — ABNORMAL HIGH (ref 0.7–4.0)
MCH: 24.6 pg — ABNORMAL LOW (ref 26.0–34.0)
MCHC: 30.3 g/dL (ref 30.0–36.0)
MCV: 81.2 fL (ref 80.0–100.0)
Monocytes Absolute: 1 10*3/uL (ref 0.1–1.0)
Monocytes Relative: 8 %
Neutro Abs: 6.8 10*3/uL (ref 1.7–7.7)
Neutrophils Relative %: 53 %
Platelets: 335 10*3/uL (ref 150–400)
RBC: 5.04 MIL/uL (ref 3.87–5.11)
RDW: 16.7 % — ABNORMAL HIGH (ref 11.5–15.5)
WBC: 12.7 10*3/uL — ABNORMAL HIGH (ref 4.0–10.5)
nRBC: 0 % (ref 0.0–0.2)

## 2018-09-02 LAB — COMPREHENSIVE METABOLIC PANEL
ALT: 17 U/L (ref 0–44)
AST: 13 U/L — ABNORMAL LOW (ref 15–41)
Albumin: 3 g/dL — ABNORMAL LOW (ref 3.5–5.0)
Alkaline Phosphatase: 40 U/L (ref 38–126)
Anion gap: 11 (ref 5–15)
BUN: 18 mg/dL (ref 6–20)
CO2: 26 mmol/L (ref 22–32)
Calcium: 8.7 mg/dL — ABNORMAL LOW (ref 8.9–10.3)
Chloride: 101 mmol/L (ref 98–111)
Creatinine, Ser: 0.62 mg/dL (ref 0.44–1.00)
GFR calc Af Amer: >60 mL/min (ref >60)
GFR calc non Af Amer: >60 mL/min (ref >60)
Glucose, Bld: 179 mg/dL — ABNORMAL HIGH (ref 70–99)
Potassium: 3.6 mmol/L (ref 3.5–5.1)
Sodium: 138 mmol/L (ref 135–145)
Total Bilirubin: 0.3 mg/dL (ref 0.3–1.2)
Total Protein: 6.8 g/dL (ref 6.5–8.1)

## 2018-09-02 LAB — FERRITIN: Ferritin: 57 ng/mL (ref 11–307)

## 2018-09-02 LAB — MAGNESIUM: Magnesium: 1.4 mg/dL — ABNORMAL LOW (ref 1.7–2.4)

## 2018-09-02 LAB — C-REACTIVE PROTEIN: CRP: 2 mg/dL — ABNORMAL HIGH (ref <1.0)

## 2018-09-02 LAB — D-DIMER, QUANTITATIVE: D-Dimer, Quant: <0.27 ug/mL-FEU (ref 0.00–0.50)

## 2018-09-02 MED ORDER — HUMULIN R U-500 KWIKPEN 500 UNIT/ML ~~LOC~~ SOPN: 100.0000 [IU] | PEN_INJECTOR | Freq: Every day | SUBCUTANEOUS | 0 refills | Status: DC

## 2018-09-02 MED ORDER — HUMULIN R U-500 KWIKPEN 500 UNIT/ML ~~LOC~~ SOPN: 90.0000 [IU] | PEN_INJECTOR | Freq: Every day | SUBCUTANEOUS | 0 refills | Status: DC

## 2018-09-02 MED ORDER — SODIUM CHLORIDE 0.9 % IV SOLN
100.0000 mg | INTRAVENOUS | Status: AC
  Administered 2018-09-02: 100 mg via INTRAVENOUS
  Filled 2018-09-02: qty 20

## 2018-09-02 MED ORDER — INSULIN GLARGINE 100 UNIT/ML SOLOSTAR PEN: 80.0000 [IU] | PEN_INJECTOR | Freq: Two times a day (BID) | SUBCUTANEOUS | 0 refills | Status: DC

## 2018-09-02 MED ORDER — PREDNISONE 10 MG PO TABS: ORAL_TABLET | ORAL | 0 refills | Status: AC

## 2018-09-02 MED ORDER — INSULIN ASPART 100 UNIT/ML ~~LOC~~ SOLN: 0.0000 [IU] | SUBCUTANEOUS | 11 refills | Status: DC

## 2018-09-02 NOTE — Progress Notes (Signed)
[facility] called to notify that pt had what appeared to be an 8.9second pause in her EKG. She stated that she would make the strip accessible for me to see through epic. Pt is sleeping comfortably and is asymptomatic. Reviewed strip and noted that the pause was longer than previous pauses and still coincide with her periods of apnea.   [phone]- Paged Dr. Josephine Cables.  51- Notified Dr. Josephine Cables who had reviewed chart and previous notes and strips for this patient. Stated I should watch her closely, and that it is likely due to her needing her cpap for sleep. If pt becomes symptomatic or if the pauses continue to be that long, I should call him back. Monitoring.

## 2018-09-02 NOTE — Discharge Summary (Signed)
Physician Discharge Summary  Ana Long LHT:[id number] DOB: 07-24-1975 DOA: 08/28/2018  PCP: Willeen Niece, PA  Admit date: 08/28/2018 Discharge date: 09/02/2018  Admitted From: Home Disposition: Home  Recommendations for Outpatient Follow-up:  1. Follow up with PCP in 1-2 weeks 2. Please obtain BMP/CBC in one week  Discharge Condition: Stable CODE STATUS: Full Diet recommendation: Diabetic, low carb, cardiac, low-salt diet  Brief/Interim Summary: 43 y.o.femalewith medical history significant of Asthma, DM2, GERD, Obesity, RA, OSA on CPAP, herpes, she presents with complaints of shortness of breath for last 5 days, kids tested positive for COVID-19 3 days ago, tested positive for COVID-19 in ED, she is transferred to [facility] for further management.  Patient admitted as above with acute hypoxic respiratory failure in the setting of COVID-19 positive swab, patient's hypoxia has now resolved.  Patient also noted to have remarkably uncontrolled diabetes mellitus type 2, hyperlipidemia, GERD, asthma all appear to be moderately well controlled at this time.  Given patient's resolution of hypoxia, today she will complete her 5-day course of Remdesivir, given her resolution in symptoms patient is otherwise stable and agreeable for discharge home.  He discussion today at bedside about need for medication compliance and compliance with her CPAP given patient did have a notable pause on telemetry last night around 9 seconds, however patient has been having pauses on telemetry prior to this again in the setting of inability to use CPAP while in negative pressure.  This time patient otherwise stable and agreeable for discharge home close monitoring by PCP in the next 3 to 5 days as scheduled.  Discharge Diagnoses:  Active Problems:   Uncontrolled type 2 diabetes mellitus with hyperglycemia (HCC)   Hyperlipidemia   Morbid obesity with BMI of 50 to 60   GERD   Asthma   Hypersomnia with sleep  apnea   OSA on CPAP   Pneumonia due to COVID-19 virus   COVID-19 virus infection  Acute hypoxic respiratory failure due to COVID-19 pneumonia, POA, resolving -Patient initially hypoxic, saturating in the 80s in room air presentation, required nasal cannula, currently she has been weaned off oxygen,on room air, but still requiring heated flow nasal cannula overnight to replace her CPAP during hospital stay . -Patient was encouraged to use incentive spirometry today, and to prone in her sides if she can tolerate . -inflammatory markers improving -completed treatment of remdesivir - continue steroid taper  Diabetes mellitus - uncontrolled -A1c is 12 which is indicative of average CBG around 300 . -Lantus 80BID; sliding scale  Hyperlipidemia -Continue with statin  GERD -Continue with home meds  Asthma -No further wheezing, will DC IV steroids - continue taper as above  OSA -COVID-19, we cannot use CPAP currently, explained this to her, likely her CPAP contributing to her hypoxia as it is more profound when she lays flat.  She is requiring heated high flow nasal cannula overnight.  Hypomagnesemia -Repleated  Discharge Instructions   Allergies as of 09/02/2018      Reactions   Bee Venom Anaphylaxis, Swelling   Wasp Venom Anaphylaxis, Swelling   Citrus Hives, Itching   Latex Hives, Swelling   Penicillins Hives, Itching   Has patient had a PCN reaction causing immediate rash, facial/tongue/throat swelling, SOB or lightheadedness with hypotension: Yes Has patient had a PCN reaction causing severe rash involving mucus membranes or skin necrosis: Unk Has patient had a PCN reaction that required hospitalization: Was already in hosp Has patient had a PCN reaction occurring within the last 10 years:  No If all of the above answers are "NO", then may proceed with Cephalosporin use.   Soap Hives, Itching, Swelling   PUREX LAUNDRY DETERGENT   Tomato Hives, Itching   Banana Itching,  Nausea Only   Chocolate Itching   Fluticasone Other (See Comments)   Per patient, makes sneeze and gag   Hydrocodone Itching, Nausea Only   Other Itching   Acidic foods- Itching [place]- Itching in the roof of the mouth   Shellfish Allergy Other (See Comments)   Patient is uncertain; stated that she tolerates this (??)   Adhesive [tape] Itching, Rash   Allergic to  "leads"   Doxycycline Other (See Comments)   AFFECTS JOINTS   Metformin And Related Nausea Only, Rash, Other (See Comments)   GI UPSET, also   Red Dye Itching, Rash, Other (See Comments)   At eye doctor's office, the dilating drops- Skin breaks out, chest turned red, itching, face became swollen      Medication List    STOP taking these medications   insulin aspart 100 UNIT/ML FlexPen Commonly known as: NOVOLOG Replaced by: insulin aspart 100 UNIT/ML injection     TAKE these medications   Accu-Chek Aviva Plus w/Device Kit Please check you blood sugars 4 times a day   accu-chek soft touch lancets Use as instructed   acyclovir 400 MG tablet Commonly known as: ZOVIRAX TAKE 1 TABLET BY MOUTH TWICE A DAY   albuterol 108 (90 Base) MCG/ACT inhaler Commonly known as: ProAir HFA Inhale 2 puffs into the lungs every 4 (four) hours as needed for wheezing or shortness of breath (cough). What changed: reasons to take this   atorvastatin 40 MG tablet Commonly known as: LIPITOR Take 1 tablet (40 mg total) by mouth at bedtime.   benzonatate 200 MG capsule Commonly known as: TESSALON Take 1 capsule (200 mg total) by mouth 3 (three) times daily as needed for cough.   dapagliflozin propanediol 5 MG Tabs tablet Commonly known as: FARXIGA Take 5 mg by mouth daily. What changed: when to take this   diclofenac 75 MG EC tablet Commonly known as: VOLTAREN Take 1 tablet (75 mg total) by mouth 2 (two) times daily. What changed:   when to take this  reasons to take this   furosemide 20 MG tablet Commonly known as:  LASIX Take 20 mg by mouth daily as needed for fluid or edema.   glipiZIDE 10 MG tablet Commonly known as: GLUCOTROL Take 1 tablet (10 mg total) by mouth 2 (two) times daily before a meal.   glucose blood test strip Commonly known as: Accu-Chek SmartView TEST FOUR TIMES DAILY   ibuprofen 600 MG tablet Commonly known as: ADVIL Take 1 tablet (600 mg total) by mouth every 8 (eight) hours as needed. What changed: reasons to take this   insulin aspart 100 UNIT/ML injection Commonly known as: novoLOG Inject 0-20 Units into the skin every 4 (four) hours. CBG 70 - 120: 0 units  CBG 121 - 150: 3 units  CBG 151 - 200: 4 units  CBG 201 - 250: 7 units  CBG 251 - 300: 11 units  CBG 301 - 350: 15 units  CBG 351 - 400: 20 units  CBG > 400 call MD Replaces: insulin aspart 100 UNIT/ML FlexPen   Insulin Glargine 100 UNIT/ML Solostar Pen Commonly known as: LANTUS Inject 80 Units into the skin 2 (two) times daily. What changed: how much to take   Insulin Pen Needle 31G X 5 MM  Misc Commonly known as: B-D UF III MINI PEN NEEDLES USE AS DIRECTED TO INJECT INSULIN FIVE A DAY E11.65   Januvia 100 MG tablet Generic drug: sitaGLIPtin TAKE 1 TABLET BY MOUTH EVERY DAY   lactulose 10 g packet Commonly known as: CEPHULAC Take 1 packet (10 g total) by mouth daily as needed. Reported on 07/29/2015 What changed:   reasons to take this  additional instructions   loratadine 10 MG tablet Commonly known as: CLARITIN Take 1 tablet (10 mg total) by mouth 2 (two) times daily. What changed:   how much to take  when to take this  reasons to take this   methocarbamol 500 MG tablet Commonly known as: Robaxin Take 1 tablet (500 mg total) by mouth every 8 (eight) hours as needed. What changed: reasons to take this   mometasone-formoterol 200-5 MCG/ACT Aero Commonly known as: Dulera Inhale 2 puffs into the lungs 2 (two) times daily.   montelukast 10 MG tablet Commonly known as: SINGULAIR Take 1  tablet (10 mg total) by mouth at bedtime.   nitroGLYCERIN 0.2 mg/hr patch Commonly known as: NITRODUR - Dosed in mg/24 hr Apply 1/4th patch to affected achilles, change daily   omeprazole 20 MG capsule Commonly known as: PRILOSEC Take 1 capsule (20 mg total) by mouth daily before breakfast.   predniSONE 10 MG tablet Commonly known as: DELTASONE Take 4 tablets (40 mg total) by mouth daily for 3 days, THEN 3 tablets (30 mg total) daily for 3 days, THEN 2 tablets (20 mg total) daily for 3 days, THEN 1 tablet (10 mg total) daily for 3 days. Start taking on: September 02, 2018       Allergies  Allergen Reactions  . Bee Venom Anaphylaxis and Swelling  . Wasp Venom Anaphylaxis and Swelling  . Citrus Hives and Itching  . Latex Hives and Swelling  . Penicillins Hives and Itching    Has patient had a PCN reaction causing immediate rash, facial/tongue/throat swelling, SOB or lightheadedness with hypotension: Yes Has patient had a PCN reaction causing severe rash involving mucus membranes or skin necrosis: Unk Has patient had a PCN reaction that required hospitalization: Was already in hosp Has patient had a PCN reaction occurring within the last 10 years: No If all of the above answers are "NO", then may proceed with Cephalosporin use.   . Soap Hives, Itching and Swelling    PUREX LAUNDRY DETERGENT  . Tomato Hives and Itching  . Banana Itching and Nausea Only  . Chocolate Itching  . Fluticasone Other (See Comments)    Per patient, makes sneeze and gag  . Hydrocodone Itching and Nausea Only  . Other Itching    Acidic foods- Itching [place]- Itching in the roof of the mouth  . Shellfish Allergy Other (See Comments)    Patient is uncertain; stated that she tolerates this (??)  . Adhesive [Tape] Itching and Rash    Allergic to  "leads"  . Doxycycline Other (See Comments)    AFFECTS JOINTS  . Metformin And Related Nausea Only, Rash and Other (See Comments)    GI UPSET, also  . 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 EXAM:  MRI OF THE RIGHT SHOULDER WITHOUT CONTRAST TECHNIQUE: Multiplanar, multisequence MR imaging of the shoulder was performed. No intravenous contrast was administered. COMPARISON:  Right shoulder x-rays dated November 22, 2017. FINDINGS: Rotator cuff: Supraspinatus tendinosis with high-grade partial-thickness articular surface tear involving the entire distal tendon, measuring 2.3 cm in AP dimension. Distal infraspinatus tendinosis with bursal surface fraying. 2.3 x 2.0 x 0.6 cm multiloculated ganglion cyst along the infraspinatus myotendinous junction. The subscapularis and teres minor tendons are unremarkable. Muscles: No atrophy or abnormal signal of the muscles of the rotator cuff. Biceps long head: Intact and normally positioned. Severe intra-articular tendinosis. Acromioclavicular Joint: Normal acromioclavicular joint. Type II acromion. Trace subacromial/subdeltoid bursal fluid. Glenohumeral Joint: Small joint effusion. Focal full-thickness cartilage loss along the inferior glenoid with subchondral marrow edema and small geode. Small humeral head marginal osteophyte. Labrum:  Torn superior labrum. Bones: No acute fracture or dislocation. No suspicious bone lesion. Other: None. IMPRESSION: 1. Supraspinatus tendinosis with high-grade partial-thickness articular surface tear involving the entire distal tendon. 2. Distal infraspinatus tendinosis with bursal surface fraying. 2.3 cm ganglion cyst along the infraspinatus myotendinous junction. 3. Severe intra-articular biceps tendinosis. 4. Superior labral tear. 5. Mild glenohumeral osteoarthritis with focal  full-thickness cartilage loss over the inferior glenoid as described above. Electronically Signed   By: Titus Dubin M.D.   On: 08/04/2018 11:06   Dg Chest Port 1 View  Result Date: 08/28/2018 CLINICAL DATA:  Chest pain.  Fever. EXAM: PORTABLE CHEST 1 VIEW COMPARISON:  September 29, 2017 FINDINGS: The heart size is mildly enlarged. There are multifocal airspace opacities bilaterally. No pneumothorax. No large pleural effusion, however there is elevation of the right hemidiaphragm which limits detection of small pleural effusions. There is no acute osseous abnormality. IMPRESSION: Low lung volumes with multifocal airspace opacities concerning for multifocal pneumonia (viral or bacterial) or developing pulmonary edema. Electronically Signed   By: Constance Holster M.D.   On: 08/28/2018 19:50    Subjective: No acute issues or events overnight, patient otherwise stable and agreeable for discharge as above   Discharge Exam: Vitals:   09/02/18 0912 09/02/18 1249  BP: 109/61   Pulse: 100   Resp: 13   Temp: 97.9 F (36.6 C) 98.4 F (36.9 C)  SpO2: 98%    Vitals:   09/02/18 0000 09/02/18 0400 09/02/18 0912 09/02/18 1249  BP:   109/61   Pulse:  (!) 56 100   Resp:  (!) 21 13   Temp: 99.1 F (37.3 C) 98.8 F (37.1 C) 97.9 F (36.6 C) 98.4 F (36.9 C)  TempSrc: Oral Oral Oral Oral  SpO2:  100% 98%   Weight:      Height:        General:  Pleasantly resting in bed, No acute distress. HEENT:  Normocephalic atraumatic.  Sclerae nonicteric, noninjected.  Extraocular movements intact bilaterally. Neck:  Without mass or deformity.  Trachea is midline. Lungs:  Clear to auscultate bilaterally without rhonchi, wheeze, or rales. Heart:  Regular rate and rhythm.  Without murmurs, rubs, or gallops. Abdomen:  Soft, nontender, nondistended.  Without guarding or rebound. Extremities: Without cyanosis, clubbing, edema, or obvious deformity. Vascular:  Dorsalis pedis and posterior tibial pulses palpable  bilaterally. Skin:  Warm and dry, no erythema, no ulcerations.   The results of significant diagnostics from this hospitalization (including imaging, microbiology, ancillary and laboratory) are listed below for reference.     Microbiology: Recent Results (from the past 240 hour(s))  SARS Coronavirus 2 (CEPHEID- Performed in [facility] hospital lab), Hosp Order     Status: Abnormal   Collection Time: 08/28/18  8:03 PM   Specimen: Nasopharyngeal Swab  Result Value Ref Range Status   SARS Coronavirus 2 POSITIVE (A) NEGATIVE Final    Comment: CRITICAL RESULT CALLED TO, READ BACK BY AND VERIFIED WITH: RN Vernon Prey AT 2304 08/28/18 CRUICKSHANK A (NOTE) If result is NEGATIVE SARS-CoV-2 target nucleic acids are NOT DETECTED. The SARS-CoV-2 RNA is generally detectable in upper and lower  respiratory specimens during the acute phase of infection. The lowest  concentration of SARS-CoV-2 viral copies this assay can detect is 250  copies / mL. A negative result does not preclude SARS-CoV-2 infection  and should not be used as the sole basis for treatment or other  patient management decisions.  A negative result may occur with  improper specimen collection / handling, submission of specimen other  than nasopharyngeal swab, presence of viral mutation(s) within the  areas targeted by this assay, and inadequate number of viral copies  (<250 copies / mL). A negative result must be combined with clinical  observations, patient history, and epidemiological information. If result is POSITIVE SARS-CoV-2 target nucleic acids ar e DETECTED. The SARS-CoV-2 RNA is generally detectable in upper and lower  respiratory specimens during the acute phase of infection.  Positive  results are indicative of active infection with SARS-CoV-2.  Clinical  correlation with patient history and other diagnostic information is  necessary to determine patient infection status.  Positive results do  not rule out bacterial  infection or co-infection with other viruses. If result is PRESUMPTIVE POSTIVE SARS-CoV-2 nucleic acids MAY BE PRESENT.   A presumptive positive result was obtained on the submitted specimen  and confirmed on repeat testing.  While 2019 novel coronavirus  (SARS-CoV-2) nucleic acids may be present in the submitted sample  additional confirmatory testing may be necessary for epidemiological  and / or clinical management purposes  to differentiate between  SARS-CoV-2 and other Sarbecovirus currently known to infect humans.  If clinically indicated additional testing with an alternate test  methodolog y [phone]) is advised. The SARS-CoV-2 RNA is generally  detectable in upper and lower respiratory specimens during the acute  phase of infection. The expected result is Negative. Fact Sheet for Patients:  StrictlyIdeas.no Fact Sheet for Healthcare Providers: BankingDealers.co.za This test is not yet approved or cleared by the [place] FDA and has been authorized for detection and/or diagnosis of SARS-CoV-2 by FDA under an Emergency Use Authorization (EUA).  This EUA will remain in effect (meaning this test can be used) for the duration of the COVID-19 declaration under Section 564(b)(1) of the Act, 21 U.S.C. section 360bbb-3(b)(1), unless the authorization is terminated or revoked sooner. Performed at [facility], [place] [address], [place], [place] [address]   Culture, blood (Routine X 2) w Reflex to ID Panel     Status: None (Preliminary result)   Collection Time: 08/29/18  5:50 AM   Specimen: BLOOD  Result Value Ref Range Status   Specimen Description   Final    BLOOD RIGHT ANTECUBITAL Performed at [facility], [place] [address], [place], [place] [address]    Special Requests   Final    BOTTLES DRAWN AEROBIC ONLY Blood Culture adequate volume Performed at [place] [place] [address]  [place], [place] [address]    Culture   Final    NO GROWTH 2 DAYS Performed at [place]  [address], [place], [place] [address]    Report Status PENDING  Incomplete  Culture, blood (Routine X 2) w Reflex to ID Panel     Status: None (Preliminary result)   Collection Time: 08/29/18  5:50 AM   Specimen: BLOOD  Result Value Ref Range Status   Specimen Description   Final    BLOOD LEFT ANTECUBITAL Performed at [facility], [place] [address], [place], [place] [address]    Special Requests   Final    BOTTLES DRAWN AEROBIC ONLY Blood Culture adequate volume Performed at [place] [address], [place], [place] [address]    Culture   Final    NO GROWTH 2 DAYS Performed at [place] [address] [place],  [address]    Report Status PENDING  Incomplete     Labs: BNP (last 3 results) Recent Labs    01/11/18 1435  BNP 64.3   Basic Metabolic Panel: Recent Labs  Lab 08/28/18 2003  08/29/18 1300 08/30/18 0318 08/31/18 0500 09/01/18 0701 09/02/18 0540  NA 134*  --   --  136 134* 138 138  K 4.3  --   --  4.9 4.6 3.7 3.6  CL 100  --   --  102 99 101 101  CO2 25  --   --  25 24 27 26   GLUCOSE 208*   < > 411* 371* 322* 234* 179*  BUN 9  --   --  17 18 15 18   CREATININE 0.61  --   --  0.67 0.62 0.56 0.62  CALCIUM 8.3*  --   --  9.0 8.8* 9.0 8.7*  MG  --   --   --  1.9 1.6* 1.5* 1.4*   < > = values in this interval not displayed.   Liver Function Tests: Recent Labs  Lab 08/29/18 0004 08/30/18 0318 08/31/18 0500 09/01/18 0701 09/02/18 0540  AST 19 15 16  14* 13*  ALT 23 22 19 21 17   ALKPHOS 36* 42 42 46 40  BILITOT 0.3 0.3 0.4 0.1* 0.3  PROT 7.2 8.0 7.6 7.3 6.8  ALBUMIN 3.2* 3.4* 3.2* 3.2* 3.0*   No results for input(s): LIPASE, AMYLASE in the last 168 hours. No results for input(s): AMMONIA in the last 168 hours. CBC: Recent Labs  Lab 08/28/18 2003 08/30/18 0318 08/31/18 0500 09/01/18 0701 09/02/18 0540   WBC 5.0 8.3 13.8* 11.1* 12.7*  NEUTROABS 3.4 7.0 11.9* 8.5* 6.8  HGB 12.0 11.9* 11.5* 12.5 12.4  HCT 40.9 40.3 38.2 41.2 40.9  MCV 83.0 82.9 82.9 82.2 81.2  PLT 110* 293 321 350 335   Cardiac Enzymes: No results for input(s): CKTOTAL, CKMB, CKMBINDEX, TROPONINI in the last 168 hours. BNP: Invalid input(s): POCBNP CBG: Recent Labs  Lab 09/01/18 1205 09/01/18 1620 09/01/18 2017 09/01/18 2357 09/02/18 0855  GLUCAP 232* 366* 307* 266* 73   D-Dimer Recent Labs    09/01/18 0701 09/02/18 0540  DDIMER 0.28 <0.27   Hgb A1c No results for input(s): HGBA1C in the last 72 hours. Lipid Profile No results for input(s): CHOL, HDL, LDLCALC, TRIG, CHOLHDL, LDLDIRECT in the last 72 hours. Thyroid function studies No results for input(s): TSH, T4TOTAL, T3FREE, THYROIDAB in the last 72 hours.  Invalid input(s): FREET3 Anemia work up Recent Labs    09/01/18 0701 09/02/18 0540  FERRITIN 79 57   Urinalysis    Component Value Date/Time   COLORURINE YELLOW 08/31/2018 [place]  08/31/2018 2307   LABSPEC 1.018 08/31/2018 2307   PHURINE 5.0 08/31/2018 2307   GLUCOSEU >=500 (A) 08/31/2018 2307   GLUCOSEU > 1000 mg/dL (A) 02/24/2010 0313   HGBUR NEGATIVE 08/31/2018 2307   BILIRUBINUR NEGATIVE 08/31/2018 2307   BILIRUBINUR Negative 12/26/2017 1518   KETONESUR NEGATIVE 08/31/2018 2307   PROTEINUR NEGATIVE 08/31/2018 2307   UROBILINOGEN 1.0 12/26/2017 1518   UROBILINOGEN 1.0 02/12/2015 1419   NITRITE NEGATIVE 08/31/2018 2307   LEUKOCYTESUR NEGATIVE 08/31/2018 2307   Sepsis Labs Invalid input(s): PROCALCITONIN,  WBC,  LACTICIDVEN Microbiology Recent Results (from the past 240 hour(s))  SARS Coronavirus 2 (CEPHEID- Performed in [place] [facility] hospital lab), Hosp Order     Status: Abnormal   Collection Time: 08/28/18  8:03 PM   Specimen: Nasopharyngeal Swab  Result Value Ref Range Status   SARS Coronavirus 2 POSITIVE (A) NEGATIVE Final    Comment: CRITICAL RESULT  CALLED TO, READ BACK BY AND VERIFIED WITH: RN Vernon Prey AT 2304 08/28/18 CRUICKSHANK A (NOTE) If result is NEGATIVE SARS-CoV-2 target nucleic acids are NOT DETECTED. The SARS-CoV-2 RNA is generally detectable in upper and lower  respiratory specimens during the acute phase of infection. The lowest  concentration of SARS-CoV-2 viral copies this assay can detect is 250  copies / mL. A negative result does not preclude SARS-CoV-2 infection  and should not be used as the sole basis for treatment or other  patient management decisions.  A negative result may occur with  improper specimen collection / handling, submission of specimen other  than nasopharyngeal swab, presence of viral mutation(s) within the  areas targeted by this assay, and inadequate number of viral copies  (<250 copies / mL). A negative result must be combined with clinical  observations, patient history, and epidemiological information. If result is POSITIVE SARS-CoV-2 target nucleic acids ar e DETECTED. The SARS-CoV-2 RNA is generally detectable in upper and lower  respiratory specimens during the acute phase of infection.  Positive  results are indicative of active infection with SARS-CoV-2.  Clinical  correlation with patient history and other diagnostic information is  necessary to determine patient infection status.  Positive results do  not rule out bacterial infection or co-infection with other viruses. If result is PRESUMPTIVE POSTIVE SARS-CoV-2 nucleic acids MAY BE PRESENT.   A presumptive positive result was obtained on the submitted specimen  and confirmed on repeat testing.  While 2019 novel coronavirus  (SARS-CoV-2) nucleic acids may be present in the submitted sample  additional confirmatory testing may be necessary for epidemiological  and / or clinical management purposes  to differentiate between  SARS-CoV-2 and other Sarbecovirus currently known to infect humans.  If clinically indicated additional  testing with an alternate test  methodolog y [id number] is advised. The SARS-CoV-2 RNA is generally  detectable in upper and lower respiratory specimens during the acute  phase of infection. The expected result is Negative. Fact Sheet for Patients:  StrictlyIdeas.no Fact Sheet for Healthcare Providers: BankingDealers.co.za This test is not yet approved or cleared by the [place] FDA and has been authorized for detection and/or diagnosis of SARS-CoV-2 by FDA under an Emergency Use Authorization (EUA).  This EUA will remain in effect (meaning this test can be used) for the duration of the COVID-19 declaration under Section 564(b)(1) of the Act, 21 U.S.C. section 360bbb-3(b)(1), unless the authorization is terminated or revoked sooner. Performed at [facility], [place] [address], [place], [place] [address]   Culture, blood (Routine X 2)  w Reflex to ID Panel     Status: None (Preliminary result)   Collection Time: 08/29/18  5:50 AM   Specimen: BLOOD  Result Value Ref Range Status   Specimen Description   Final    BLOOD RIGHT ANTECUBITAL Performed at [facility], [place] [address] [place], [place] [address]    Special Requests   Final    BOTTLES DRAWN AEROBIC ONLY Blood Culture adequate volume Performed at [facility] [address], [place], [place] [address]    Culture   Final    NO GROWTH 2 DAYS Performed at [facility] [address] [place], [place] [address]    Report Status PENDING  Incomplete  Culture, blood (Routine X 2) w Reflex to ID Panel     Status: None (Preliminary result)   Collection Time: 08/29/18  5:50 AM   Specimen: BLOOD  Result Value Ref Range Status   Specimen Description   Final    BLOOD LEFT ANTECUBITAL Performed at [facility], [place] [address], [place], [place] [address]    Special Requests   Final    BOTTLES DRAWN AEROBIC ONLY Blood Culture  adequate volume Performed at [facility] [address], [place], [place] [address]    Culture   Final    NO GROWTH 2 DAYS Performed at [facility] [address], [place], [place] [address]    Report Status PENDING  Incomplete     Time coordinating discharge: Over 30 minutes  SIGNED:  Little Ishikawa, DO [facility] 09/02/2018, 12:56 PM Pager   If 7PM-7AM, please contact night-coverage [email or web address] Password [email or web address]

## 2018-09-02 NOTE — Progress Notes (Signed)
Nsg Discharge Note  Admit Date:  08/28/2018 Discharge date: 09/02/2018   Ana Long to be D/C' home per MD order.  AVS completed.  Copy for chart, and copy for patient signed, and dated. Patient/caregiver able to verbalize understanding.  Discharge Medication: Allergies as of 09/02/2018      Reactions   Bee Venom Anaphylaxis, Swelling   Wasp Venom Anaphylaxis, Swelling   Citrus Hives, Itching   Latex Hives, Swelling   Penicillins Hives, Itching   Has patient had a PCN reaction causing immediate rash, facial/tongue/throat swelling, SOB or lightheadedness with hypotension: Yes Has patient had a PCN reaction causing severe rash involving mucus membranes or skin necrosis: Unk Has patient had a PCN reaction that required hospitalization: Was already in hosp Has patient had a PCN reaction occurring within the last 10 years: No If all of the above answers are "NO", then may proceed with Cephalosporin use.   Soap Hives, Itching, Swelling   PUREX LAUNDRY DETERGENT   Tomato Hives, Itching   Banana Itching, Nausea Only   Chocolate Itching   Fluticasone Other (See Comments)   Per patient, makes sneeze and gag   Hydrocodone Itching, Nausea Only   Other Itching   Acidic foods- Itching [place]- Itching in the roof of the mouth   Shellfish Allergy Other (See Comments)   Patient is uncertain; stated that she tolerates this (??)   Adhesive [tape] Itching, Rash   Allergic to  "leads"   Doxycycline Other (See Comments)   AFFECTS JOINTS   Metformin And Related Nausea Only, Rash, Other (See Comments)   GI UPSET, also   Red Dye Itching, Rash, Other (See Comments)   At eye doctor's office, the dilating drops- Skin breaks out, chest turned red, itching, face became swollen      Medication List    STOP taking these medications   insulin aspart 100 UNIT/ML FlexPen Commonly known as: NOVOLOG Replaced by: insulin aspart 100 UNIT/ML injection     TAKE these medications   Accu-Chek Aviva  Plus w/Device Kit Please check you blood sugars 4 times a day Notes to patient: Next dose due 7/11   accu-chek soft touch lancets Use as instructed Notes to patient: Next dose due 7/11   acyclovir 400 MG tablet Commonly known as: ZOVIRAX TAKE 1 TABLET BY MOUTH TWICE A DAY Notes to patient: Next dose due 7/11   albuterol 108 (90 Base) MCG/ACT inhaler Commonly known as: ProAir HFA Inhale 2 puffs into the lungs every 4 (four) hours as needed for wheezing or shortness of breath (cough). What changed: reasons to take this   atorvastatin 40 MG tablet Commonly known as: LIPITOR Take 1 tablet (40 mg total) by mouth at bedtime. Notes to patient: Next dose due 7/11   benzonatate 200 MG capsule Commonly known as: TESSALON Take 1 capsule (200 mg total) by mouth 3 (three) times daily as needed for cough.   dapagliflozin propanediol 5 MG Tabs tablet Commonly known as: FARXIGA Take 5 mg by mouth daily. What changed: when to take this Notes to patient: Next dose due 7/12   diclofenac 75 MG EC tablet Commonly known as: VOLTAREN Take 1 tablet (75 mg total) by mouth 2 (two) times daily. What changed:   when to take this  reasons to take this Notes to patient: Next dose due 7/11   furosemide 20 MG tablet Commonly known as: LASIX Take 20 mg by mouth daily as needed for fluid or edema.   glipiZIDE 10 MG  tablet Commonly known as: GLUCOTROL Take 1 tablet (10 mg total) by mouth 2 (two) times daily before a meal. Notes to patient: Next dose due 7/11   glucose blood test strip Commonly known as: Accu-Chek SmartView TEST FOUR TIMES DAILY   ibuprofen 600 MG tablet Commonly known as: ADVIL Take 1 tablet (600 mg total) by mouth every 8 (eight) hours as needed. What changed: reasons to take this   insulin aspart 100 UNIT/ML injection Commonly known as: novoLOG Inject 0-20 Units into the skin every 4 (four) hours. CBG 70 - 120: 0 units  CBG 121 - 150: 3 units  CBG 151 - 200: 4  units  CBG 201 - 250: 7 units  CBG 251 - 300: 11 units  CBG 301 - 350: 15 units  CBG 351 - 400: 20 units  CBG > 400 call MD Replaces: insulin aspart 100 UNIT/ML FlexPen Notes to patient: Next dose due 7/11   Insulin Glargine 100 UNIT/ML Solostar Pen Commonly known as: LANTUS Inject 80 Units into the skin 2 (two) times daily. What changed: how much to take Notes to patient: Next dose due 7/11   Insulin Pen Needle 31G X 5 MM Misc Commonly known as: B-D UF III MINI PEN NEEDLES USE AS DIRECTED TO INJECT INSULIN FIVE A DAY E11.65 Notes to patient: Next dose due 7/11   Januvia 100 MG tablet Generic drug: sitaGLIPtin TAKE 1 TABLET BY MOUTH EVERY DAY   lactulose 10 g packet Commonly known as: CEPHULAC Take 1 packet (10 g total) by mouth daily as needed. Reported on 07/29/2015 What changed:   reasons to take this  additional instructions   loratadine 10 MG tablet Commonly known as: CLARITIN Take 1 tablet (10 mg total) by mouth 2 (two) times daily. What changed:   how much to take  when to take this  reasons to take this Notes to patient: Next dose due 7/11   methocarbamol 500 MG tablet Commonly known as: Robaxin Take 1 tablet (500 mg total) by mouth every 8 (eight) hours as needed. What changed: reasons to take this   mometasone-formoterol 200-5 MCG/ACT Aero Commonly known as: Dulera Inhale 2 puffs into the lungs 2 (two) times daily. Notes to patient: Next dose due 7/11   montelukast 10 MG tablet Commonly known as: SINGULAIR Take 1 tablet (10 mg total) by mouth at bedtime. Notes to patient: Next dose due 7/11   nitroGLYCERIN 0.2 mg/hr patch Commonly known as: NITRODUR - Dosed in mg/24 hr Apply 1/4th patch to affected achilles, change daily Notes to patient: Next dose due 7/12   omeprazole 20 MG capsule Commonly known as: PRILOSEC Take 1 capsule (20 mg total) by mouth daily before breakfast.   predniSONE 10 MG tablet Commonly known as: DELTASONE Take 4  tablets (40 mg total) by mouth daily for 3 days, THEN 3 tablets (30 mg total) daily for 3 days, THEN 2 tablets (20 mg total) daily for 3 days, THEN 1 tablet (10 mg total) daily for 3 days. Start taking on: September 02, 2018 Notes to patient: Next dose due 7/11       Discharge Assessment: Vitals:   09/02/18 1249 09/02/18 1310  BP:  (!) 100/58  Pulse:  (!) 118  Resp:  (!) 23  Temp: 98.4 F (36.9 C)   SpO2:  94%   Skin clean, dry and intact without evidence of skin break down, no evidence of skin tears noted. IV catheter discontinued intact. Site without signs and symptoms  of complications - no redness or edema noted at insertion site, patient denies c/o pain - only slight tenderness at site.  Dressing with slight pressure applied.  D/c Instructions-Education: Discharge instructions given to patient/family with verbalized understanding. D/c education completed with patient/family including follow up instructions, medication list, d/c activities limitations if indicated, with other d/c instructions as indicated by MD - patient able to verbalize understanding, all questions fully answered. Patient instructed to return to ED, call 911, or call MD for any changes in condition.  Patient escorted via [place], and D/C home via private auto.  Kweku Stankey, Jolene Schimke, RN 09/02/2018 5:19 PM

## 2018-09-02 NOTE — Discharge Instructions (Signed)
COVID-19 Frequently Asked Questions COVID-19 (coronavirus disease) is an infection that is caused by a large family of viruses. Some viruses cause illness in people and others cause illness in animals like camels, cats, and bats. In some cases, the viruses that cause illness in animals can spread to humans. Where did the coronavirus come from? In December 2019, [place] told the [facility]) of several cases of lung disease (human respiratory illness). These cases were linked to an open seafood and livestock market in the city of [place]. The link to the seafood and livestock market suggests that the virus may have spread from animals to humans. However, since that first outbreak in December, the virus has also been shown to spread from person to person. What is the name of the disease and the virus? Disease name Early on, this disease was called novel coronavirus. This is because scientists determined that the disease was caused by a new (novel) respiratory virus. The World Health Organization [facility]) has now named the disease COVID-19, or coronavirus disease. Virus name The virus that causes the disease is called severe acute respiratory syndrome coronavirus 2 (SARS-CoV-2). More information on disease and virus naming World Health Organization [facility]): www.who.int/emergencies/diseases/novel-coronavirus-2019/technical-guidance/naming-the-coronavirus-disease-(covid-2019)-and-the-virus-that-causes-it Who is at risk for complications from coronavirus disease? Some people may be at higher risk for complications from coronavirus disease. This includes older adults and people who have chronic diseases, such as heart disease, diabetes, and lung disease. If you are at higher risk for complications, take these extra precautions:  Avoid close contact with people who are sick or have a fever or cough. Stay at least 3-6 ft (1-2 m) away from them, if possible.  Wash your hands often with soap and  water for at least 20 seconds.  Avoid touching your face, mouth, nose, or eyes.  Keep supplies on hand at home, such as food, medicine, and cleaning supplies.  Stay home as much as possible.  Avoid social gatherings and travel. How does coronavirus disease spread? The virus that causes coronavirus disease spreads easily from person to person (is contagious). There are also cases of community-spread disease. This means the disease has spread to:  People who have no known contact with other infected people.  People who have not traveled to areas where there are known cases. It appears to spread from one person to another through droplets from coughing or sneezing. Can I get the virus from touching surfaces or objects? There is still a lot that we do not know about the virus that causes coronavirus disease. Scientists are basing a lot of information on what they know about similar viruses, such as:  Viruses cannot generally survive on surfaces for long. They need a human body (host) to survive.  It is more likely that the virus is spread by close contact with people who are sick (direct contact), such as through: ? Shaking hands or hugging. ? Breathing in respiratory droplets that travel through the air. This can happen when an infected person coughs or sneezes on or near other people.  It is less likely that the virus is spread when a person touches a surface or object that has the virus on it (indirect contact). The virus may be able to enter the body if the person touches a surface or object and then touches his or her face, eyes, nose, or mouth. Can a person spread the virus without having symptoms of the disease? It may be possible for the virus to spread before a person  has symptoms of the disease, but this is most likely not the main way the virus is spreading. It is more likely for the virus to spread by being in close contact with people who are sick and breathing in the respiratory  droplets of a sick person's cough or sneeze. What are the symptoms of coronavirus disease? Symptoms vary from person to person and can range from mild to severe. Symptoms may include:  Fever.  Cough.  Tiredness, weakness, or fatigue.  Fast breathing or feeling short of breath. These symptoms can appear anywhere from 2 to 14 days after you have been exposed to the virus. If you develop symptoms, call your health care provider. People with severe symptoms may need hospital care. If I am exposed to the virus, how long does it take before symptoms start? Symptoms of coronavirus disease may appear anywhere from 2 to 14 days after a person has been exposed to the virus. If you develop symptoms, call your health care provider. Should I be tested for this virus? Your health care provider will decide whether to test you based on your symptoms, history of exposure, and your risk factors. How does a health care provider test for this virus? Health care providers will collect samples to send for testing. Samples may include:  Taking a swab of fluid from the nose.  Taking fluid from the lungs by having you cough up mucus (sputum) into a sterile cup.  Taking a blood sample.  Taking a stool or urine sample. Is there a treatment or vaccine for this virus? Currently, there is no vaccine to prevent coronavirus disease. Also, there are no medicines like antibiotics or antivirals to treat the virus. A person who becomes sick is given supportive care, which means rest and fluids. A person may also relieve his or her symptoms by using over-the-counter medicines that treat sneezing, coughing, and runny nose. These are the same medicines that a person takes for the common cold. If you develop symptoms, call your health care provider. People with severe symptoms may need hospital care. What can I do to protect myself and my family from this virus?     You can protect yourself and your family by taking the  same actions that you would take to prevent the spread of other viruses. Take the following actions:  Wash your hands often with soap and water for at least 20 seconds. If soap and water are not available, use alcohol-based hand sanitizer.  Avoid touching your face, mouth, nose, or eyes.  Cough or sneeze into a tissue, sleeve, or elbow. Do not cough or sneeze into your hand or the air. ? If you cough or sneeze into a tissue, throw it away immediately and wash your hands.  Disinfect objects and surfaces that you frequently touch every day.  Avoid close contact with people who are sick or have a fever or cough. Stay at least 3-6 ft (1-2 m) away from them, if possible.  Stay home if you are sick, except to get medical care. Call your health care provider before you get medical care.  Make sure your vaccines are up to date. Ask your health care provider what vaccines you need. What should I do if I need to travel? Follow travel recommendations from your local health authority, the CDC, and WHO. Travel information and advice  Centers for Disease Control and Prevention (CDC): BodyEditor.hu  World Health Organization [facility]): ThirdIncome.ca Know the risks and take action to protect your health  You are at higher risk of getting coronavirus disease if you are traveling to areas with an outbreak or if you are exposed to travelers from areas with an outbreak.  Wash your hands often and practice good hygiene to lower the risk of catching or spreading the virus. What should I do if I am sick? General instructions to stop the spread of infection  Wash your hands often with soap and water for at least 20 seconds. If soap and water are not available, use alcohol-based hand sanitizer.  Cough or sneeze into a tissue, sleeve, or elbow. Do not cough or sneeze into your hand or the air.  If you cough or  sneeze into a tissue, throw it away immediately and wash your hands.  Stay home unless you must get medical care. Call your health care provider or local health authority before you get medical care.  Avoid public areas. Do not take public transportation, if possible.  If you can, wear a mask if you must go out of the house or if you are in close contact with someone who is not sick. Keep your home clean  Disinfect objects and surfaces that are frequently touched every day. This may include: ? Counters and tables. ? Doorknobs and light switches. ? Sinks and faucets. ? Electronics such as phones, remote controls, keyboards, computers, and tablets.  Wash dishes in hot, soapy water or use a dishwasher. Air-dry your dishes.  Wash laundry in hot water. Prevent infecting other household members  Let healthy household members care for children and pets, if possible. If you have to care for children or pets, wash your hands often and wear a mask.  Sleep in a different bedroom or bed, if possible.  Do not share personal items, such as razors, toothbrushes, deodorant, combs, brushes, towels, and washcloths. Where to find more information Centers for Disease Control and Prevention (CDC)  Information and news updates: https://www.butler-gonzalez.com/ World Health Organization [facility])  Information and news updates: MissExecutive.com.ee  Coronavirus health topic: https://www.castaneda.info/  Questions and answers on COVID-19: OpportunityDebt.at  Global tracker: who.sprinklr.com American Academy of Pediatrics (AAP)  Information for families: www.healthychildren.org/English/health-issues/conditions/chest-lungs/Pages/2019-Novel-Coronavirus.aspx The coronavirus situation is changing rapidly. Check your local health authority website or the CDC and [facility] websites for updates and news. When should I contact a health care  provider?  Contact your health care provider if you have symptoms of an infection, such as fever or cough, and you: ? Have been near anyone who is known to have coronavirus disease. ? Have come into contact with a person who is suspected to have coronavirus disease. ? Have traveled outside of the country. When should I get emergency medical care?  Get help right away by calling your local emergency services (911 in the [place] if you have: ? Trouble breathing. ? Pain or pressure in your chest. ? Confusion. ? Blue-tinged lips and fingernails. ? Difficulty waking from sleep. ? Symptoms that get worse. Let the emergency medical personnel know if you think you have coronavirus disease. Summary  A new respiratory virus is spreading from person to person and causing COVID-19 (coronavirus disease).  The virus that causes COVID-19 appears to spread easily. It spreads from one person to another through droplets from coughing or sneezing.  Older adults and those with chronic diseases are at higher risk of disease. If you are at higher risk for complications, take extra precautions.  There is currently no vaccine to prevent coronavirus disease. There are no medicines, such as antibiotics or  antivirals, to treat the virus.  You can protect yourself and your family by washing your hands often, avoiding touching your face, and covering your coughs and sneezes. This information is not intended to replace advice given to you by your health care provider. Make sure you discuss any questions you have with your health care provider. Document Released: 06/06/2018 Document Revised: 06/06/2018 Document Reviewed: 06/06/2018 Elsevier Patient Education  2020 [employer].   COVID-19 COVID-19 is a respiratory infection that is caused by a virus called severe acute respiratory syndrome coronavirus 2 (SARS-CoV-2). The disease is also known as coronavirus disease or novel coronavirus. In some people, the virus  may not cause any symptoms. In others, it may cause a serious infection. The infection can get worse quickly and can lead to complications, such as:  Pneumonia, or infection of the lungs.  Acute respiratory distress syndrome or ARDS. This is fluid build-up in the lungs.  Acute respiratory failure. This is a condition in which there is not enough oxygen passing from the lungs to the body.  Sepsis or septic shock. This is a serious bodily reaction to an infection.  Blood clotting problems.  Secondary infections due to bacteria or fungus. The virus that causes COVID-19 is contagious. This means that it can spread from person to person through droplets from coughs and sneezes (respiratory secretions). What are the causes? This illness is caused by a virus. You may catch the virus by:  Breathing in droplets from an infected person's cough or sneeze.  Touching something, like a table or a doorknob, that was exposed to the virus (contaminated) and then touching your mouth, nose, or eyes. What increases the risk? Risk for infection You are more likely to be infected with this virus if you:  Live in or travel to an area with a COVID-19 outbreak.  Come in contact with a sick person who recently traveled to an area with a COVID-19 outbreak.  Provide care for or live with a person who is infected with COVID-19. Risk for serious illness You are more likely to become seriously ill from the virus if you:  Are 18 years of age or older.  Have a long-term disease that lowers your body's ability to fight infection (immunocompromised).  Live in a nursing home or long-term care facility.  Have a long-term (chronic) disease such as: ? Chronic lung disease, including chronic obstructive pulmonary disease or asthma ? Heart disease. ? Diabetes. ? Chronic kidney disease. ? Liver disease.  Are obese. What are the signs or symptoms? Symptoms of this condition can range from mild to severe.  Symptoms may appear any time from 2 to 14 days after being exposed to the virus. They include:  A fever.  A cough.  Difficulty breathing.  Chills.  Muscle pains.  A sore throat.  Loss of taste or smell. Some people may also have stomach problems, such as nausea, vomiting, or diarrhea. Other people may not have any symptoms of COVID-19. How is this diagnosed? This condition may be diagnosed based on:  Your signs and symptoms, especially if: ? You live in an area with a COVID-19 outbreak. ? You recently traveled to or from an area where the virus is common. ? You provide care for or live with a person who was diagnosed with COVID-19.  A physical exam.  Lab tests, which may include: ? A nasal swab to take a sample of fluid from your nose. ? A throat swab to take a sample of  fluid from your throat. ? A sample of mucus from your lungs (sputum). ? Blood tests.  Imaging tests, which may include, X-rays, CT scan, or ultrasound. How is this treated? At present, there is no medicine to treat COVID-19. Medicines that treat other diseases are being used on a trial basis to see if they are effective against COVID-19. Your health care provider will talk with you about ways to treat your symptoms. For most people, the infection is mild and can be managed at home with rest, fluids, and over-the-counter medicines. Treatment for a serious infection usually takes places in a hospital intensive care unit (ICU). It may include one or more of the following treatments. These treatments are given until your symptoms improve.  Receiving fluids and medicines through an IV.  Supplemental oxygen. Extra oxygen is given through a tube in the nose, a face mask, or a hood.  Positioning you to lie on your stomach (prone position). This makes it easier for oxygen to get into the lungs.  Continuous positive airway pressure (CPAP) or bi-level positive airway pressure (BPAP) machine. This treatment uses mild  air pressure to keep the airways open. A tube that is connected to a motor delivers oxygen to the body.  Ventilator. This treatment moves air into and out of the lungs by using a tube that is placed in your windpipe.  Tracheostomy. This is a procedure to create a hole in the neck so that a breathing tube can be inserted.  Extracorporeal membrane oxygenation (ECMO). This procedure gives the lungs a chance to recover by taking over the functions of the heart and lungs. It supplies oxygen to the body and removes carbon dioxide. Follow these instructions at home: Lifestyle  If you are sick, stay home except to get medical care. Your health care provider will tell you how long to stay home. Call your health care provider before you go for medical care.  Rest at home as told by your health care provider.  Do not use any products that contain nicotine or tobacco, such as cigarettes, e-cigarettes, and chewing tobacco. If you need help quitting, ask your health care provider.  Return to your normal activities as told by your health care provider. Ask your health care provider what activities are safe for you. General instructions  Take over-the-counter and prescription medicines only as told by your health care provider.  Drink enough fluid to keep your urine pale yellow.  Keep all follow-up visits as told by your health care provider. This is important. How is this prevented?  There is no vaccine to help prevent COVID-19 infection. However, there are steps you can take to protect yourself and others from this virus. To protect yourself:   Do not travel to areas where COVID-19 is a risk. The areas where COVID-19 is reported change often. To identify high-risk areas and travel restrictions, check the CDC travel website: FatFares.com.br  If you live in, or must travel to, an area where COVID-19 is a risk, take precautions to avoid infection. ? Stay away from people who are  sick. ? Wash your hands often with soap and water for 20 seconds. If soap and water are not available, use an alcohol-based hand sanitizer. ? Avoid touching your mouth, face, eyes, or nose. ? Avoid going out in public, follow guidance from your state and local health authorities. ? If you must go out in public, wear a cloth face covering or face mask. ? Disinfect objects and surfaces that are  frequently touched every day. This may include:  Counters and tables.  Doorknobs and light switches.  Sinks and faucets.  Electronics, such as phones, remote controls, keyboards, computers, and tablets. To protect others: If you have symptoms of COVID-19, take steps to prevent the virus from spreading to others.  If you think you have a COVID-19 infection, contact your health care provider right away. Tell your health care team that you think you may have a COVID-19 infection.  Stay home. Leave your house only to seek medical care. Do not use public transport.  Do not travel while you are sick.  Wash your hands often with soap and water for 20 seconds. If soap and water are not available, use alcohol-based hand sanitizer.  Stay away from other members of your household. Let healthy household members care for children and pets, if possible. If you have to care for children or pets, wash your hands often and wear a mask. If possible, stay in your own room, separate from others. Use a different bathroom.  Make sure that all people in your household wash their hands well and often.  Cough or sneeze into a tissue or your sleeve or elbow. Do not cough or sneeze into your hand or into the air.  Wear a cloth face covering or face mask. Where to find more information  Centers for Disease Control and Prevention: PurpleGadgets.be  World Health Organization: https://www.castaneda.info/ Contact a health care provider if:  You live in or have traveled to an area  where COVID-19 is a risk and you have symptoms of the infection.  You have had contact with someone who has COVID-19 and you have symptoms of the infection. Get help right away if:  You have trouble breathing.  You have pain or pressure in your chest.  You have confusion.  You have bluish lips and fingernails.  You have difficulty waking from sleep.  You have symptoms that get worse. These symptoms may represent a serious problem that is an emergency. Do not wait to see if the symptoms will go away. Get medical help right away. Call your local emergency services (911 in the [place] Do not drive yourself to the hospital. Let the emergency medical personnel know if you think you have COVID-19. Summary  COVID-19 is a respiratory infection that is caused by a virus. It is also known as coronavirus disease or novel coronavirus. It can cause serious infections, such as pneumonia, acute respiratory distress syndrome, acute respiratory failure, or sepsis.  The virus that causes COVID-19 is contagious. This means that it can spread from person to person through droplets from coughs and sneezes.  You are more likely to develop a serious illness if you are 13 years of age or older, have a weak immunity, live in a nursing home, or have chronic disease.  There is no medicine to treat COVID-19. Your health care provider will talk with you about ways to treat your symptoms.  Take steps to protect yourself and others from infection. Wash your hands often and disinfect objects and surfaces that are frequently touched every day. Stay away from people who are sick and wear a mask if you are sick. This information is not intended to replace advice given to you by your health care provider. Make sure you discuss any questions you have with your health care provider. Document Released: 03/16/2018 Document Revised: 07/06/2018 Document Reviewed: 03/16/2018 Elsevier Patient Education  2020 [employer].

## 2018-09-02 NOTE — Progress Notes (Signed)
Hypoglycemic Event  CBG: 49  Treatment: orange juice 4oz  Symptoms: Asynptomatic  Follow-up CBG: Time :0840 CBG Result:54  Follow-up CBG: Time :0900 CBG Result:73 Possible Reasons for Event: Insulin dosage Comments/MD notified: Conchita Paris

## 2018-09-03 LAB — CULTURE, BLOOD (ROUTINE X 2)
Culture: NO GROWTH
Culture: NO GROWTH
Special Requests: ADEQUATE
Special Requests: ADEQUATE

## 2018-09-04 LAB — GLUCOSE, CAPILLARY
Glucose-Capillary: 282 mg/dL — ABNORMAL HIGH (ref 70–99)
Glucose-Capillary: 315 mg/dL — ABNORMAL HIGH (ref 70–99)

## 2018-09-11 ENCOUNTER — Other Ambulatory Visit: Payer: Self-pay | Admitting: Internal Medicine

## 2018-09-13 ENCOUNTER — Other Ambulatory Visit: Payer: Self-pay

## 2018-09-13 ENCOUNTER — Encounter: Payer: Self-pay | Admitting: Family Medicine

## 2018-09-13 ENCOUNTER — Ambulatory Visit: Payer: Medicaid Other | Admitting: Family Medicine

## 2018-09-13 ENCOUNTER — Ambulatory Visit (HOSPITAL_COMMUNITY)
Admission: EM | Admit: 2018-09-13 | Discharge: 2018-09-13 | Disposition: A | Discharge status: Home / Self Care | Payer: Medicaid Other

## 2018-09-13 VITALS — BP 124/75 | HR 91 | Temp 98.7°F | Ht 62.0 in | Wt 318.8 lb

## 2018-09-13 DIAGNOSIS — G4733 Obstructive sleep apnea (adult) (pediatric): Secondary | ICD-10-CM

## 2018-09-13 DIAGNOSIS — U071 COVID-19: Secondary | ICD-10-CM

## 2018-09-13 DIAGNOSIS — Z9989 Dependence on other enabling machines and devices: Secondary | ICD-10-CM | POA: Diagnosis not present

## 2018-09-13 DIAGNOSIS — J1282 Pneumonia due to coronavirus disease 2019: Secondary | ICD-10-CM

## 2018-09-13 DIAGNOSIS — S40861A Insect bite (nonvenomous) of right upper arm, initial encounter: Secondary | ICD-10-CM | POA: Diagnosis not present

## 2018-09-13 DIAGNOSIS — J1289 Other viral pneumonia: Secondary | ICD-10-CM

## 2018-09-13 DIAGNOSIS — W57XXXA Bitten or stung by nonvenomous insect and other nonvenomous arthropods, initial encounter: Secondary | ICD-10-CM

## 2018-09-13 NOTE — Patient Instructions (Addendum)
Continue using CPAP nightly and for greater than 4 hours each night  We will adjust pressure settings to 5-16cmH20  Follow up in 6 weeks for repeat download     Sleep Apnea Sleep apnea affects breathing during sleep. It causes breathing to stop for a short time or to become shallow. It can also increase the risk of:  Heart attack.  Stroke.  Being very overweight (obese).  Diabetes.  Heart failure.  Irregular heartbeat. The goal of treatment is to help you breathe normally again. What are the causes? There are three kinds of sleep apnea:  Obstructive sleep apnea. This is caused by a blocked or collapsed airway.  Central sleep apnea. This happens when the brain does not send the right signals to the muscles that control breathing.  Mixed sleep apnea. This is a combination of obstructive and central sleep apnea. The most common cause of this condition is a collapsed or blocked airway. This can happen if:  Your throat muscles are too relaxed.  Your tongue and tonsils are too large.  You are overweight.  Your airway is too small. What increases the risk?  Being overweight.  Smoking.  Having a small airway.  Being older.  Being female.  Drinking alcohol.  Taking medicines to calm yourself (sedatives or tranquilizers).  Having family members with the condition. What are the signs or symptoms?  Trouble staying asleep.  Being sleepy or tired during the day.  Getting angry a lot.  Loud snoring.  Headaches in the morning.  Not being able to focus your mind (concentrate).  Forgetting things.  Less interest in sex.  Mood swings.  Personality changes.  Feelings of sadness (depression).  Waking up a lot during the night to pee (urinate).  Dry mouth.  Sore throat. How is this diagnosed?  Your medical history.  A physical exam.  A test that is done when you are sleeping (sleep study). The test is most often done in a sleep lab but may also be  done at home. How is this treated?   Sleeping on your side.  Using a medicine to get rid of mucus in your nose (decongestant).  Avoiding the use of alcohol, medicines to help you relax, or certain pain medicines (narcotics).  Losing weight, if needed.  Changing your diet.  Not smoking.  Using a machine to open your airway while you sleep, such as: ? An oral appliance. This is a mouthpiece that shifts your lower jaw forward. ? A CPAP device. This device blows air through a mask when you breathe out (exhale). ? An EPAP device. This has valves that you put in each nostril. ? A BPAP device. This device blows air through a mask when you breathe in (inhale) and breathe out.  Having surgery if other treatments do not work. It is important to get treatment for sleep apnea. Without treatment, it can lead to:  High blood pressure.  Coronary artery disease.  In men, not being able to have an erection (impotence).  Reduced thinking ability. Follow these instructions at home: Lifestyle  Make changes that your doctor recommends.  Eat a healthy diet.  Lose weight if needed.  Avoid alcohol, medicines to help you relax, and some pain medicines.  Do not use any products that contain nicotine or tobacco, such as cigarettes, e-cigarettes, and chewing tobacco. If you need help quitting, ask your doctor. General instructions  Take over-the-counter and prescription medicines only as told by your doctor.  If you were  given a machine to use while you sleep, use it only as told by your doctor.  If you are having surgery, make sure to tell your doctor you have sleep apnea. You may need to bring your device with you.  Keep all follow-up visits as told by your doctor. This is important. Contact a doctor if:  The machine that you were given to use during sleep bothers you or does not seem to be working.  You do not get better.  You get worse. Get help right away if:  Your chest hurts.   You have trouble breathing in enough air.  You have an uncomfortable feeling in your back, arms, or stomach.  You have trouble talking.  One side of your body feels weak.  A part of your face is hanging down. These symptoms may be an emergency. Do not wait to see if the symptoms will go away. Get medical help right away. Call your local emergency services (911 in the [place] Do not drive yourself to the hospital. Summary  This condition affects breathing during sleep.  The most common cause is a collapsed or blocked airway.  The goal of treatment is to help you breathe normally while you sleep. This information is not intended to replace advice given to you by your health care provider. Make sure you discuss any questions you have with your health care provider. Document Released: 11/18/2007 Document Revised: 11/25/2017 Document Reviewed: 10/04/2017 [employer] Patient Education  2020 [employer].
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
## 2018-09-13 NOTE — ED Triage Notes (Signed)
Pt states she is leaving to bring another family member to be seen, will return.

## 2018-09-14 ENCOUNTER — Encounter: Payer: Self-pay | Admitting: Family Medicine

## 2018-10-11 ENCOUNTER — Encounter (INDEPENDENT_AMBULATORY_CARE_PROVIDER_SITE_OTHER): Payer: Medicaid Other | Admitting: Ophthalmology

## 2018-10-25 ENCOUNTER — Other Ambulatory Visit: Payer: Self-pay

## 2018-10-25 ENCOUNTER — Ambulatory Visit: Payer: Medicaid Other | Admitting: Family Medicine

## 2018-10-25 ENCOUNTER — Encounter: Payer: Self-pay | Admitting: Family Medicine

## 2018-10-25 VITALS — BP 113/77 | HR 92 | Temp 98.9°F | Ht 62.0 in | Wt 316.8 lb

## 2018-10-25 DIAGNOSIS — G4733 Obstructive sleep apnea (adult) (pediatric): Secondary | ICD-10-CM

## 2018-10-25 DIAGNOSIS — Z9989 Dependence on other enabling machines and devices: Secondary | ICD-10-CM

## 2018-10-25 NOTE — Progress Notes (Signed)
PATIENT: Ana Long DOB: 06/07/1975  REASON FOR VISIT: follow up HISTORY FROM: patient  Chief Complaint  Patient presents with   Follow-up    room 2, with her son. CPAP/OSA. "works fine. takes alittle bit"     HISTORY OF PRESENT ILLNESS: Today 10/25/18 Ana Long is a 43 y.o. female here today for follow up for OSA on CPAP.  She has been doing better with compliance since last being seen.  Compliance report dated 09/25/2018 through 10/24/2018 reveals that he is using CPAP 25 out of the last 30 days for compliance of 83%.  She has used CPAP greater than 4 hours 16 of those days for compliance of 53%.  Average usage was 4 hours and 19 minutes.  AHI was 12.8 on 4 to 15 cm of water.  There was no significant leak.  Pressure in the 95th percentile at 13.8.  She is tolerating pressures.  HISTORY: (copied from my note on 09/13/2018)  Ana Long is a 43 y.o. female here today for follow up for OSA on CPAP.  She reports that she has done better with compliance.  Unfortunately, she did have a hospitalization recently for COVID that inhibited her ability to use CPAP.  Since returning home she has been able to use CPAP nightly.  She is continuing to work on greater than 4 hours usage.  Compliance report dated 08/14/2018 through 09/12/2018 reveals that she has used her machine 23 out of the last 30 days for compliance of 77%.  10 of those days she used her machine greater than 4 hours for compliance of 33%.  AHI remains elevated at 10.7 on 4 to 15 cm of water and an EPR of 3.  There was no significant leak.  Today she was admitted to [facility] for + COVID testing on 08/28/2018 and discharged on 09/02/2018.  She reports being in quarantine until 09/11/2018.  She continues to have little energy and a persistent cough. She is currently being treated for PNA associated with COVID. She is taking multiple steroid medications.  She has completed course of antibiotics.  She reports that as she  entered our office today her daughter pointed out that she had a tick attached to the backside of her right upper arm.  She has not noted this to and is unsure how long it has been there.  While in the office with me today she called primary care and scheduled an appointment for removal and possible treatment if indicated.  HISTORY: (copied from my note on 06/13/2018) location  Ana Long a 43 y.o.femalefor follow up of OSA on CPAP.Ana Long admits that she has not used her machine regularly. She was having some trouble with shoulder pain and unable to sleep well at night. For the past week she has been sleeping better. She had a steroid injection placed last week. She states that since she has used her CPAP machine every night and for greater than 4 hours each night.     History (copied from Ana Long's note on 03/15/2018)  Ana Long a 43 y.o.female, seen here as in a referral from Ana Long a sleep evaluation. Ana Long happiness patient of our sleep practice when she was under Ana Long care, I performed a split-night polysomnography with her on 17 November 2012 at the time she had endorsed the Epworth Sleepiness Scale at 18 points and had a BMI of 57, she was short of breath at rest. Her AHI was  120of the highest apnea and hypopnea indecesseen in our care. The patient received an S9 Elite CPAP machine after she had been titrated to a pressure of 15 cmH2O with 3 cm EPR she also used a Swift fracture interface at the time. She continued to use her CPAP until recently but it broke. As far as I can see she has no compliance data for November which was when she was hospitalized. During her hospitalization it was evident that she had severe hypoxemia whenever she slept her oxygen levels desaturated. Looking at her last compliance report for the last 30 days she has been only 7% or 2 days able to use her CPAP machine for  longer than 4 hours, so this is a poor compliance but she assured me that her machine is not working. She does no longer have a fresh mask or interface that would seal. The residual AHI is between 11.8 and 9.6, she has high air leaks with a maximum up to 61.8 L/min. In other words her download are invalid. Patient needs an urgent return for a CPAP titration also to see if sleep hypoxemia is sufficiently treated with CPAP alone or if she has need for oxygen.  She has many other chronic medical problems as I can see under the progress notes that were cosigned by Ana Linsey, MD. Polycystic ovarian syndrome rheumatoid arthritis, hyperglycemia, gastroesophageal reflux disease, diabetes mellitus type 2, asthma exacerbation in adult severe and persistent, shortness of breath, major joint osteoarthritis, acute non-intractable headaches, shoulder pain back pain ankle pain hip pain. Her current weight exceeds 330 pounds, her diabetes was considered uncontrolled.  She currently endorsed the fatigue severity score 45 points out of 63, the Epworth Sleepiness Scale was endorsed at 19 out of 24 points.  Chief complaint according to patient :" I have dropped my oxygen sats in the hospital? Had a severe asthma attack at the time, too.  Sleep habits are as follows: Dinner time is 7 Pm and bedtime is at 2-3 AM, former third shift worker- now disabled. she spends her evenings after dinner with dinner prep for the next day, homework for the children and laundry. The bedroom is either cool, nor quiet but dark. She has a Warehouse manager ( !) with a night light in her bedroom. Uses albuterol and breathing treatment at bedtime.  She has nocturia times 2-3 , she sleeps on her elevated bed- supine ,on multiple pillows.  Has orthopneoa, children report her snoring, pausing , her chest rattling. She wakes at 5 AM to get her children to school, who are 77, 29, 71 years old.  Sleep medical history and family  sleep history:OSA severe for a long time, see previous sleep study.  Family history of OSA on maternal side, 2 uncles, one aunt and cousins. Paternal aunts and father himself.  Social history:Non smoker, raises 3 children , but has guardianship over 3 children, mother  She would like to adopt. No alcohol.  REVIEW OF SYSTEMS: Out of a complete 14 system review of symptoms, the patient complains only of the following symptoms, none and all other reviewed systems are negative.  Epworth sleepiness scale: 14 Fatigue severity scale: 48   ALLERGIES: Allergies  Allergen Reactions   Bee Venom Anaphylaxis and Swelling   Wasp Venom Anaphylaxis and Swelling   Citrus Hives and Itching   Latex Hives and Swelling   Penicillins Hives and Itching    Has patient had a PCN reaction causing immediate rash, facial/tongue/throat swelling, SOB or lightheadedness  with hypotension: Yes Has patient had a PCN reaction causing severe rash involving mucus membranes or skin necrosis: Unk Has patient had a PCN reaction that required hospitalization: Was already in hosp Has patient had a PCN reaction occurring within the last 10 years: No If all of the above answers are "NO", then may proceed with Cephalosporin use.    Soap Hives, Itching and Swelling    PUREX LAUNDRY DETERGENT   Tomato Hives and Itching   Banana Itching and Nausea Only   Chocolate Itching   Fluticasone Other (See Comments)    Per patient, makes sneeze and gag   Hydrocodone Itching and Nausea Only   Other Itching    Acidic foods- Itching [place]- Itching in the roof of the mouth   Shellfish Allergy Other (See Comments)    Patient is uncertain; stated that she tolerates this (??)   Adhesive [Tape] Itching and Rash    Allergic to  "leads"   Doxycycline Other (See Comments)    AFFECTS JOINTS   Metformin And Related Nausea Only, Rash, Other (See Comments) and Diarrhea    GI UPSET, also   Red Dye Itching, Rash and Other  (See Comments)    At eye doctor's office, the dilating drops- Skin breaks out, chest turned red, itching, face became swollen    HOME MEDICATIONS: Outpatient Medications Prior to Visit  Medication Sig Dispense Refill   acyclovir (ZOVIRAX) 400 MG tablet TAKE 1 TABLET BY MOUTH TWICE A DAY 60 tablet 2   albuterol (PROAIR HFA) 108 (90 Base) MCG/ACT inhaler Inhale 2 puffs into the lungs every 4 (four) hours as needed for wheezing or shortness of breath (cough). (Patient taking differently: Inhale 2 puffs into the lungs every 4 (four) hours as needed for wheezing or shortness of breath (or coughing). ) 6.7 g 2   atorvastatin (LIPITOR) 40 MG tablet Take 1 tablet (40 mg total) by mouth at bedtime. 90 tablet 1   benzonatate (TESSALON) 200 MG capsule Take 1 capsule (200 mg total) by mouth 3 (three) times daily as needed for cough. 20 capsule 0   Blood Glucose Monitoring Suppl (ACCU-CHEK AVIVA PLUS) w/Device KIT Please check you blood sugars 4 times a day 1 kit 0   dapagliflozin propanediol (FARXIGA) 5 MG TABS tablet Take 5 mg by mouth daily. (Patient taking differently: Take 5 mg by mouth 2 (two) times a day. ) 90 tablet 1   diclofenac (VOLTAREN) 75 MG EC tablet Take 1 tablet (75 mg total) by mouth 2 (two) times daily. (Patient taking differently: Take 75 mg by mouth 2 (two) times daily as needed (for pain or inflammation). ) 60 tablet 1   empagliflozin (JARDIANCE) 25 MG TABS tablet Take by mouth.     fluconazole (DIFLUCAN) 150 MG tablet TAKE 1 TABLET BY MOUTH NOW. THEN REPEAT IN 1 WEEK     furosemide (LASIX) 20 MG tablet Take 20 mg by mouth daily as needed for fluid or edema.      glipiZIDE (GLUCOTROL) 10 MG tablet Take 1 tablet (10 mg total) by mouth 2 (two) times daily before a meal. 60 tablet 3   glucose blood (ACCU-CHEK SMARTVIEW) test strip TEST FOUR TIMES DAILY 150 each 6   ibuprofen (ADVIL) 600 MG tablet Take 1 tablet (600 mg total) by mouth every 8 (eight) hours as needed. (Patient  taking differently: Take 600 mg by mouth every 8 (eight) hours as needed for moderate pain. ) 90 tablet 1   insulin aspart (NOVOLOG) 100  UNIT/ML injection Inject 0-20 Units into the skin every 4 (four) hours. CBG 70 - 120: 0 units  CBG 121 - 150: 3 units  CBG 151 - 200: 4 units  CBG 201 - 250: 7 units  CBG 251 - 300: 11 units  CBG 301 - 350: 15 units  CBG 351 - 400: 20 units  CBG > 400 call MD 10 mL 11   Insulin Pen Needle (B-D UF III MINI PEN NEEDLES) 31G X 5 MM MISC USE AS DIRECTED TO INJECT INSULIN FIVE A DAY E11.65 100 each 6   JANUVIA 100 MG tablet TAKE 1 TABLET BY MOUTH EVERY DAY 90 tablet 3   lactulose (CEPHULAC) 10 g packet Take 1 packet (10 g total) by mouth daily as needed. Reported on 07/29/2015 (Patient taking differently: Take 10 g by mouth daily as needed (for constipation). ) 30 each 0   Lancets (ACCU-CHEK SOFT TOUCH) lancets Use as instructed 100 each 12   liraglutide (VICTOZA) 18 MG/3ML SOPN Take 0.56m daily for 1 week, then 1.2107mdaily for 1 week, then 1.53m26maily.     loratadine (CLARITIN) 10 MG tablet Take 1 tablet (10 mg total) by mouth 2 (two) times daily. (Patient taking differently: Take 20 mg by mouth daily as needed for allergies. ) 180 tablet 1   meloxicam (MOBIC) 15 MG tablet TAKE 1 TABLET BY MOUTH ONCE A DAY TAKE DAILY FOR TWO WEEKS, AND THEN ONLY AS NEEDED AFTERWARDS.     methocarbamol (ROBAXIN) 500 MG tablet Take 1 tablet (500 mg total) by mouth every 8 (eight) hours as needed. (Patient taking differently: Take 500 mg by mouth every 8 (eight) hours as needed for muscle spasms. ) 60 tablet 1   mometasone-formoterol (DULERA) 200-5 MCG/ACT AERO Inhale 2 puffs into the lungs 2 (two) times daily. 13 Inhaler 1   montelukast (SINGULAIR) 10 MG tablet Take 1 tablet (10 mg total) by mouth at bedtime. 90 tablet 1   nitroGLYCERIN (NITRODUR - DOSED IN MG/24 HR) 0.2 mg/hr patch Apply 1/4th patch to affected achilles, change daily 30 patch 1   omeprazole (PRILOSEC) 20  MG capsule Take 1 capsule (20 mg total) by mouth daily before breakfast. 90 capsule 2   Insulin Glargine (LANTUS) 100 UNIT/ML Solostar Pen Inject 80 Units into the skin 2 (two) times daily. 15 pen 0   No facility-administered medications prior to visit.     PAST MEDICAL HISTORY: Past Medical History:  Diagnosis Date   Acute nonintractable headache 12/26/2017   Arthritis    major joints   Asthma    prn inhaler   Asthma exacerbation 07/25/2013   Asthma in adult, severe persistent, with acute exacerbation 01/11/2018   Carpal tunnel syndrome of right wrist 8/27/6160Complication of anesthesia    states is hard to wake up post-op   Constipation 12/30/2014   Diabetes mellitus    intolerant to Metformin   Dry skin    hands    Family history of anesthesia complication    mother went into coma during c-section   Genital herpes 01/01/2006   on acyclovir BID    GERD (gastroesophageal reflux disease)    Herpes genital    Hyperglycemia 12/26/2017   Low back pain 11/22/2012   Lower extremity edema 10/18/2012   Menorrhagia 11/06/2015   Morbid obesity with BMI of 50 to 60 07/23/2009   Obesity hypoventilation syndrome ([place]/09/2012   Obesity, morbid ([facility]/19/2015   OSA on CPAP  AHI was on  11-17-12 to 120/hr, titrated to 15 cm with 3 cm EPR/    PCOS (polycystic ovarian syndrome)    Rheumatoid arthritis ([place])    Right ankle pain 06/19/2015   Right hip pain 09/04/2017   Right shoulder pain 06/01/2017   Seasonal allergies    Sleep apnea, obstructive    uses CPAP nightly - had sleep study 08/22/2007    PAST SURGICAL HISTORY: Past Surgical History:  Procedure Laterality Date   CARPAL TUNNEL RELEASE Right 10/02/2012   Procedure: RIGHT CARPAL TUNNEL RELEASE ENDOSCOPIC;  Surgeon: Jolyn Nap, MD;  Location: [facility];  Service: Orthopedics;  Laterality: Right;   CHOLECYSTECTOMY N/A 12/02/2014   Procedure: LAPAROSCOPIC CHOLECYSTECTOMY;  Surgeon:  Armandina Gemma, MD;  Location: [facility];  Service: General;  Laterality: N/A;   HYSTEROSCOPY W/D&C  01-08-2002    FAMILY HISTORY: Family History  Problem Relation Age of Onset   Other Mother    Leukemia Cousin    Other Other    Obesity Other    Other Other    Diabetes Maternal Aunt    Heart attack Maternal Aunt    Alzheimer's disease Maternal Uncle    Cancer Maternal Uncle        prostate   Diabetes Paternal Aunt    Cancer Paternal Aunt        brain,mouth   Alzheimer's disease Paternal Aunt    Diabetes Paternal Uncle    Diabetes Maternal Grandmother    Diabetes Maternal Grandfather     SOCIAL HISTORY: Social History   Socioeconomic History   Marital status: Divorced    Spouse name: Not on file   Number of children: 2   Years of education: 14   Highest education level: Not on file  Occupational History   Occupation: UNEMPLOYED    Fish farm manager: UNEMPLOYED  Social Designer, fashion/clothing strain: Not on file   Food insecurity    Worry: Not on file    Inability: Not on file   Transportation needs    Medical: Not on file    Non-medical: Not on file  Tobacco Use   Smoking status: Former Smoker    Years: 10.00    Quit date: 05/24/2010    Years since quitting: 8.4   Smokeless tobacco: Never Used  Substance and Sexual Activity   Alcohol use: Yes    Alcohol/week: 0.0 standard drinks    Comment: socially   Drug use: No   Sexual activity: Yes    Birth control/protection: None    Comment: Lives w/a partner  Lifestyle   Physical activity    Days per week: Not on file    Minutes per session: Not on file   Stress: Not on file  Relationships   Social connections    Talks on phone: Not on file    Gets together: Not on file    Attends religious service: Not on file    Active member of club or organization: Not on file    Attends meetings of clubs or organizations: Not on file    Relationship status: Not on file   Intimate partner violence      Fear of current or ex partner: Not on file    Emotionally abused: Not on file    Physically abused: Not on file    Forced sexual activity: Not on file  Other Topics Concern   Not on file  Social History Narrative   Single, but lives with a  partner   Has 2 children and cares for an infant during the day            PHYSICAL EXAM  Vitals:   10/25/18 1446  BP: 113/77  Pulse: 92  Temp: 98.9 F (37.2 C)  Weight: (!) 316 lb 12.8 oz (143.7 kg)  Height: 5' 2"  (1.575 m)   Body mass index is 57.94 kg/m.  Generalized: Well developed, in no acute distress  Neurological examination  Mentation: Alert oriented to time, place, history taking. Follows all commands speech and language fluent Motor: The motor testing reveals 5 over 5 strength of all 4 extremities. Good symmetric motor tone is noted throughout.  Gait and station: Gait is normal..   DIAGNOSTIC DATA (LABS, IMAGING, TESTING) - I reviewed patient records, labs, notes, testing and imaging myself where available.  No flowsheet data found.   Lab Results  Component Value Date   WBC 12.7 (H) 09/02/2018   HGB 12.4 09/02/2018   HCT 40.9 09/02/2018   MCV 81.2 09/02/2018   PLT 335 09/02/2018      Component Value Date/Time   NA 138 09/02/2018 0540   NA 139 10/21/2016 1525   K 3.6 09/02/2018 0540   CL 101 09/02/2018 0540   CO2 26 09/02/2018 0540   GLUCOSE 179 (H) 09/02/2018 0540   BUN 18 09/02/2018 0540   BUN 6 10/21/2016 1525   CREATININE 0.62 09/02/2018 0540   CREATININE 0.60 07/24/2014 1550   CALCIUM 8.7 (L) 09/02/2018 0540   PROT 6.8 09/02/2018 0540   PROT 6.9 12/30/2014 1641   ALBUMIN 3.0 (L) 09/02/2018 0540   ALBUMIN 3.9 12/30/2014 1641   AST 13 (L) 09/02/2018 0540   ALT 17 09/02/2018 0540   ALKPHOS 40 09/02/2018 0540   BILITOT 0.3 09/02/2018 0540   BILITOT 0.3 12/30/2014 1641   GFRNONAA >60 09/02/2018 0540   GFRNONAA >89 07/24/2014 1550   GFRAA >60 09/02/2018 0540   GFRAA >89 07/24/2014 1550   Lab  Results  Component Value Date   CHOL 148 07/24/2014   HDL 41 (L) 07/24/2014   LDLCALC 85 07/24/2014   TRIG 112 07/24/2014   CHOLHDL 3.6 07/24/2014   Lab Results  Component Value Date   HGBA1C 12.0 (H) 08/29/2018   Lab Results  Component Value Date   VITAMINB12 352 02/19/2008   Lab Results  Component Value Date   TSH 1.040 11/16/2017       ASSESSMENT AND PLAN 43 y.o. year old female  has a past medical history of Acute nonintractable headache (12/26/2017), Arthritis, Asthma, Asthma exacerbation (07/25/2013), Asthma in adult, severe persistent, with acute exacerbation (01/11/2018), Carpal tunnel syndrome of right wrist (10/5091), Complication of anesthesia, Constipation (12/30/2014), Diabetes mellitus, Dry skin, Family history of anesthesia complication, Genital herpes (01/01/2006), GERD (gastroesophageal reflux disease), Herpes genital, Hyperglycemia (12/26/2017), Low back pain (11/22/2012), Lower extremity edema (10/18/2012), Menorrhagia (11/06/2015), Morbid obesity with BMI of 50 to 60 (07/23/2009), Obesity hypoventilation syndrome ([place]) (10/30/2012), Obesity, morbid ([place]) (01/10/2014), OSA on CPAP, PCOS (polycystic ovarian syndrome), Rheumatoid arthritis ([place]), Right ankle pain (06/19/2015), Right hip pain (09/04/2017), Right shoulder pain (06/01/2017), Seasonal allergies, and Sleep apnea, obstructive. here with     ICD-10-CM   1. OSA on CPAP  G47.33 For home use only DME continuous positive airway pressure (CPAP)   Z99.89     Jamacia is doing better with compliance.  Compliance data reveals that she is more consistent with use.  She was encouraged to continue using CPAP nightly and work on  using machine greater than 4 hours every night.  AHI does remain elevated at 12.8.  We will increase the max pressure from 15 cm of water to 16 cm of water.  I have advised that she follow-up in 3 months to reassess.  She verbalizes understanding and agreement with this plan.   Orders Placed This Encounter    Procedures   For home use only DME continuous positive airway pressure (CPAP)    Please increase max pressure to 16cmH20.    Order Specific Question:   Length of Need    Answer:   Lifetime    Order Specific Question:   Patient has OSA or probable OSA    Answer:   Yes    Order Specific Question:   Is the patient currently using CPAP in the home    Answer:   Yes    Order Specific Question:   Settings    Answer:   Other see comments    Order Specific Question:   CPAP supplies needed    Answer:   Mask, headgear, cushions, filters, heated tubing and water chamber     No orders of the defined types were placed in this encounter.     I spent 15 minutes with the patient. 50% of this time was spent counseling and educating patient on plan of care and medications.    Debbora Presto, FNP-C 10/25/2018, 3:44 PM [facility] [address], [place], [place] [address] [phone]

## 2018-11-16 ENCOUNTER — Telehealth: Payer: Self-pay | Admitting: Neurology

## 2018-11-16 NOTE — Telephone Encounter (Signed)
Received a notification from [facility] that pt never met compliance. Pt will per medicaid guidelines will have to attempt if wanting to continue by repeating everything.

## 2018-12-11 ENCOUNTER — Other Ambulatory Visit: Payer: Self-pay | Admitting: Internal Medicine

## 2019-01-08 ENCOUNTER — Encounter (INDEPENDENT_AMBULATORY_CARE_PROVIDER_SITE_OTHER): Payer: Medicaid Other | Admitting: Ophthalmology

## 2019-01-09 NOTE — Progress Notes (Addendum)
[facility] Note  01/10/2019     CHIEF COMPLAINT Patient presents for Retina Follow Up   HISTORY OF PRESENT ILLNESS: Ana Long is a 43 y.o. female who presents to the clinic today for:   HPI    Retina Follow Up    Patient presents with  Diabetic Retinopathy.  In both eyes.  This started 1 year ago.  Severity is moderate.  I, the attending physician,  performed the HPI with the patient and updated documentation appropriately.          Comments    Patient here for year retina follow up for DM eval from Dr Katy Fitch. Patient states vision comes and goes. Fine things are an issue. 3D things are an issue. The letters on novolog pen gets blurry. No eye pain. Just discharge- crusty.       Last edited by Bernarda Caffey, MD on 01/11/2019  1:58 PM. (History)    pt states she was referred back here by Dr. [name]. She doesn't remember being here last year at all.  Referring physician: [name] MD [place], [place] [phone]  HISTORICAL INFORMATION:   Selected notes from the [facility] Re-referred by Dr. [name] for DM exam LEE: 08.10.20 (S. [place]) [BCVA: OD: 20/25 OS: 20/25] Ocular Hx-cataracts OU, DES OU, allergic conjunctivitis OU, glaucoma suspect, chalazion PMH-DM (takes Januvia, Lantus), rheumatoid arthritis, hx of migraines, herpes, last a1c 11.2    CURRENT MEDICATIONS: No current outpatient medications on file. (Ophthalmic Drugs)   No current facility-administered medications for this visit.  (Ophthalmic Drugs)   Current Outpatient Medications (Other)  Medication Sig  . acyclovir (ZOVIRAX) 400 MG tablet TAKE 1 TABLET BY MOUTH TWICE A DAY  . albuterol (PROAIR HFA) 108 (90 Base) MCG/ACT inhaler Inhale 2 puffs into the lungs every 4 (four) hours as needed for wheezing or shortness of breath (cough). (Patient taking differently: Inhale 2 puffs into the lungs every 4 (four) hours as needed for wheezing or  shortness of breath (or coughing). )  . atorvastatin (LIPITOR) 40 MG tablet Take 1 tablet (40 mg total) by mouth at bedtime.  . benzonatate (TESSALON) 200 MG capsule Take 1 capsule (200 mg total) by mouth 3 (three) times daily as needed for cough.  . Blood Glucose Monitoring Suppl (ACCU-CHEK AVIVA PLUS) w/Device KIT Please check you blood sugars 4 times a day  . dapagliflozin propanediol (FARXIGA) 5 MG TABS tablet Take 5 mg by mouth daily. (Patient taking differently: Take 5 mg by mouth 2 (two) times a day. )  . diclofenac (VOLTAREN) 75 MG EC tablet Take 1 tablet (75 mg total) by mouth 2 (two) times daily. (Patient taking differently: Take 75 mg by mouth 2 (two) times daily as needed (for pain or inflammation). )  . empagliflozin (JARDIANCE) 25 MG TABS tablet Take by mouth.  . fluconazole (DIFLUCAN) 150 MG tablet TAKE 1 TABLET BY MOUTH NOW. THEN REPEAT IN 1 WEEK  . furosemide (LASIX) 20 MG tablet Take 20 mg by mouth daily as needed for fluid or edema.   Marland Kitchen glipiZIDE (GLUCOTROL) 10 MG tablet Take 1 tablet (10 mg total) by mouth 2 (two) times daily before a meal.  . glucose blood (ACCU-CHEK SMARTVIEW) test strip TEST FOUR TIMES DAILY  . ibuprofen (ADVIL) 600 MG tablet Take 1 tablet (600 mg total) by mouth every 8 (eight) hours as needed. (Patient taking differently: Take 600 mg by mouth every 8 (  eight) hours as needed for moderate pain. )  . insulin aspart (NOVOLOG) 100 UNIT/ML injection Inject 0-20 Units into the skin every 4 (four) hours. CBG 70 - 120: 0 units  CBG 121 - 150: 3 units  CBG 151 - 200: 4 units  CBG 201 - 250: 7 units  CBG 251 - 300: 11 units  CBG 301 - 350: 15 units  CBG 351 - 400: 20 units  CBG > 400 call MD  . Insulin Glargine (LANTUS) 100 UNIT/ML Solostar Pen Inject 80 Units into the skin 2 (two) times daily.  . Insulin Pen Needle (B-D UF III MINI PEN NEEDLES) 31G X 5 MM MISC USE AS DIRECTED TO INJECT INSULIN FIVE A DAY E11.65  . JANUVIA 100 MG tablet TAKE 1 TABLET BY MOUTH  EVERY DAY  . lactulose (CEPHULAC) 10 g packet Take 1 packet (10 g total) by mouth daily as needed. Reported on 07/29/2015 (Patient taking differently: Take 10 g by mouth daily as needed (for constipation). )  . Lancets (ACCU-CHEK SOFT TOUCH) lancets Use as instructed  . liraglutide (VICTOZA) 18 MG/3ML SOPN Take 0.6m daily for 1 week, then 1.275mdaily for 1 week, then 1.17m59maily.  . lMarland Kitchenratadine (CLARITIN) 10 MG tablet Take 1 tablet (10 mg total) by mouth 2 (two) times daily. (Patient taking differently: Take 20 mg by mouth daily as needed for allergies. )  . meloxicam (MOBIC) 15 MG tablet TAKE 1 TABLET BY MOUTH ONCE A DAY TAKE DAILY FOR TWO WEEKS, AND THEN ONLY AS NEEDED AFTERWARDS.  . mMarland Kitchenthocarbamol (ROBAXIN) 500 MG tablet Take 1 tablet (500 mg total) by mouth every 8 (eight) hours as needed. (Patient taking differently: Take 500 mg by mouth every 8 (eight) hours as needed for muscle spasms. )  . mometasone-formoterol (DULERA) 200-5 MCG/ACT AERO Inhale 2 puffs into the lungs 2 (two) times daily.  . montelukast (SINGULAIR) 10 MG tablet Take 1 tablet (10 mg total) by mouth at bedtime.  . nitroGLYCERIN (NITRODUR - DOSED IN MG/24 HR) 0.2 mg/hr patch Apply 1/4th patch to affected achilles, change daily  . omeprazole (PRILOSEC) 20 MG capsule Take 1 capsule (20 mg total) by mouth daily before breakfast.   No current facility-administered medications for this visit.  (Other)      REVIEW OF SYSTEMS: ROS    Positive for: Endocrine, Eyes   Negative for: Constitutional, Gastrointestinal, Neurological, Skin, Genitourinary, Musculoskeletal, HENT, Cardiovascular, Respiratory, Psychiatric, Allergic/Imm, Heme/Lymph   Last edited by ClaTheodore DemarkOA on 01/10/2019  2:42 PM. (History)       ALLERGIES Allergies  Allergen Reactions  . Bee Venom Anaphylaxis and Swelling  . Wasp Venom Anaphylaxis and Swelling  . Citrus Hives and Itching  . Latex Hives and Swelling  . Penicillins Hives and Itching     Has patient had a PCN reaction causing immediate rash, facial/tongue/throat swelling, SOB or lightheadedness with hypotension: Yes Has patient had a PCN reaction causing severe rash involving mucus membranes or skin necrosis: Unk Has patient had a PCN reaction that required hospitalization: Was already in hosp Has patient had a PCN reaction occurring within the last 10 years: No If all of the above answers are "NO", then may proceed with Cephalosporin use.   . Soap Hives, Itching and Swelling    PUREX LAUNDRY DETERGENT  . Tomato Hives and Itching  . Banana Itching and Nausea Only  . Chocolate Itching  . Fluticasone Other (See Comments)    Per patient, makes sneeze and gag  .  Hydrocodone Itching and Nausea Only  . Other Itching    Acidic foods- Itching [place]- Itching in the roof of the mouth  . Shellfish Allergy Other (See Comments)    Patient is uncertain; stated that she tolerates this (??)  . Adhesive [Tape] Itching and Rash    Allergic to  "leads"  . Doxycycline Other (See Comments)    AFFECTS JOINTS  . Metformin And Related Nausea Only, Rash, Other (See Comments) and Diarrhea    GI UPSET, also  . Red Dye Itching, Rash and Other (See Comments)    At eye doctor's office, the dilating drops- Skin breaks out, chest turned red, itching, face became swollen    PAST MEDICAL HISTORY Past Medical History:  Diagnosis Date  . Acute nonintractable headache 12/26/2017  . Arthritis    major joints  . Asthma    prn inhaler  . Asthma exacerbation 07/25/2013  . Asthma in adult, severe persistent, with acute exacerbation 01/11/2018  . Carpal tunnel syndrome of right wrist 09/2012  . Complication of anesthesia    states is hard to wake up post-op  . Constipation 12/30/2014  . Diabetes mellitus    intolerant to Metformin  . Dry skin    hands   . Family history of anesthesia complication    mother went into coma during c-section  . Genital herpes 01/01/2006   on acyclovir BID   . GERD  (gastroesophageal reflux disease)   . Herpes genital   . Hyperglycemia 12/26/2017  . Low back pain 11/22/2012  . Lower extremity edema 10/18/2012  . Menorrhagia 11/06/2015  . Morbid obesity with BMI of 50 to 60 07/23/2009  . Obesity hypoventilation syndrome ([place]) 10/30/2012  . Obesity, morbid ([place]) 01/10/2014  . OSA on CPAP     AHI was on  11-17-12 to 120/hr, titrated to 15 cm with 3 cm EPR/   . PCOS (polycystic ovarian syndrome)   . Rheumatoid arthritis ([place])   . Right ankle pain 06/19/2015  . Right hip pain 09/04/2017  . Right shoulder pain 06/01/2017  . Seasonal allergies   . Sleep apnea, obstructive    uses CPAP nightly - had sleep study 08/22/2007   Past Surgical History:  Procedure Laterality Date  . CARPAL TUNNEL RELEASE Right 10/02/2012   Procedure: RIGHT CARPAL TUNNEL RELEASE ENDOSCOPIC;  Surgeon: Jolyn Nap, MD;  Location: [facility];  Service: Orthopedics;  Laterality: Right;  . CHOLECYSTECTOMY N/A 12/02/2014   Procedure: LAPAROSCOPIC CHOLECYSTECTOMY;  Surgeon: Armandina Gemma, MD;  Location: [facility];  Service: General;  Laterality: N/A;  . HYSTEROSCOPY W/D&C  01-08-2002    FAMILY HISTORY Family History  Problem Relation Age of Onset  . Other Mother   . Leukemia Cousin   . Other Other   . Obesity Other   . Other Other   . Diabetes Maternal Aunt   . Heart attack Maternal Aunt   . Alzheimer's disease Maternal Uncle   . Cancer Maternal Uncle        prostate  . Diabetes Paternal Aunt   . Cancer Paternal Aunt        brain,mouth  . Alzheimer's disease Paternal Aunt   . Diabetes Paternal Uncle   . Diabetes Maternal Grandmother   . Diabetes Maternal Grandfather     SOCIAL HISTORY Social History   Tobacco Use  . Smoking status: Former Smoker    Years: 10.00    Quit date: 05/24/2010    Years since quitting: 8.6  . Smokeless  tobacco: Never Used  Substance Use Topics  . Alcohol use: Yes    Alcohol/week: 0.0 standard drinks    Comment: socially  . Drug  use: No         OPHTHALMIC EXAM:  Base Eye Exam    Visual Acuity (Snellen - Linear)      Right Left   Dist cc 20/40 +2 20/25 -2   Dist ph cc 20/25 20/20 -2   Correction: Glasses       Tonometry (Tonopen, 2:37 PM)      Right Left   Pressure 19 16       Pupils      Dark Light Shape React APD   Right 4 3 Round Brisk None   Left 4 3 Round Brisk None       Visual Fields (Counting fingers)      Left Right    Full Full       Extraocular Movement      Right Left    Full, Ortho Full, Ortho       Neuro/Psych    Oriented x3: Yes   Mood/Affect: Normal       Dilation    Both eyes: 1.0% Mydriacyl, 2.5% Phenylephrine @ 2:37 PM        Slit Lamp and Fundus Exam    Slit Lamp Exam      Right Left   Lids/Lashes Dermatochalasis - upper lid, mild Meibomian gland dysfunction Dermatochalasis - upper lid, mild Meibomian gland dysfunction   Conjunctiva/Sclera White and quiet White and quiet   Cornea Trace Punctate epithelial erosions Trace Punctate epithelial erosions   Anterior Chamber deep and quiet deep and quiet   Iris round and dilated, No NVI round and dilated   Lens 1+ NS, 1+CC 1+NS, 1+CC   Vitreous mild vitreous syneresis mild vitreous syneresis       Fundus Exam      Right Left   Disc Pink and sharp Pink and sharp   C/D Ratio 0.65 0.65   Macula good foveal reflex, scattered punctate exudates/MA/IRH, +cystic changes blunted foveal reflex, scatttered MA's and exudate, +cystic changes   Vessels Vascular attenuation, Tortuous, Copper wiring Vascular attenuation, Tortuous, Copper wiring   Periphery Attached, scattered MA greatest nasally Attached, scattered MA greatest nasally        Refraction    Wearing Rx      Sphere Cylinder Axis   Right -2.50 +1.50 013   Left -2.25 Sphere    Type: SVL          IMAGING AND PROCEDURES  Imaging and Procedures for @TODAY @  OCT, Retina - OU - Both Eyes       Right Eye Quality was good. Progression has worsened.  Findings include normal foveal contour, no SRF, intraretinal fluid, intraretinal hyper-reflective material (Mild cystic changes temporal fovea).   Left Eye Quality was good. Central Foveal Thickness: 280. Progression has no prior data. Findings include intraretinal fluid, no SRF, abnormal foveal contour, intraretinal hyper-reflective material (Patches of IRF -- temporal fovea, superior to fovea, inferotemporal to fovea).   Notes *Images captured and stored on drive  Diagnosis / Impression:  +DME OU  Clinical management:  See below  Abbreviations: NFP - Normal foveal profile. CME - cystoid macular edema. PED - pigment epithelial detachment. IRF - intraretinal fluid. SRF - subretinal fluid. EZ - ellipsoid zone. ERM - epiretinal membrane. ORA - outer retinal atrophy. ORT - outer retinal tubulation. SRHM - subretinal hyper-reflective material  ASSESSMENT/PLAN:    ICD-10-CM   1. Moderate nonproliferative diabetic retinopathy of both eyes with macular edema associated with type 2 diabetes mellitus ([place])  Z61.0960   2. Retinal edema  H35.81 OCT, Retina - OU - Both Eyes  3. Essential hypertension  I10   4. Hypertensive retinopathy of both eyes  H35.033   5. Nuclear sclerosis of both eyes  H25.13     1,2. Moderate non-proliferative diabetic retinopathy w/ DME, OU  - pt lost to f/u since 10.23.2019 -- was supposed to return for focal laser for DME  - FA in 2019 shows leaking MA OU; no NV OU  **of note, pt had pruritic rxn to fluorescein dye -- resolved with 25 mg IV benadryl**  - exam shows scattered MA, IRH, exudates and mild macular edema OU  - OCT shows mild diabetic macular edema, OU  - BCVA good: 20/25 OD, 20/20 OS  - f/u in 4 weeks, DFE, OCT, repeat FA (optos, transit OD) -- pre-treat with benadryl  3,4. Hypertensive retinopathy OU  - discussed importance of tight BP control  - monitor  5. Nuclear Sclerosis   - The symptoms of cataract, surgical options,  and treatments and risks were discussed with patient.  - discussed diagnosis and progression  - under the expert management of [place] Ordered this visit:  No orders of the defined types were placed in this encounter.      Return in about 4 weeks (around 02/07/2019) for f/u NPDR OU, DFE, OCT (FA, optos, transit OD) .  There are no Patient Instructions on file for this visit.   Explained the diagnoses, plan, and follow up with the patient and they expressed understanding.  Patient expressed understanding of the importance of proper follow up care.   This document serves as a record of services personally performed by Gardiner Sleeper, MD, PhD. It was created on their behalf by Ernest Mallick, OA, an ophthalmic assistant. The creation of this record is the provider's dictation and/or activities during the visit.    Electronically signed by: Ernest Mallick, OA  11.18.20 4:28 PM    Gardiner Sleeper, M.D., Ph.D. Diseases & Surgery of the Retina and Vitreous [name]   I have reviewed the above documentation for accuracy and completeness, and I agree with the above. Gardiner Sleeper, M.D., Ph.D. 01/11/19 4:28 PM   Abbreviations: M myopia (nearsighted); A astigmatism; H hyperopia (farsighted); P presbyopia; Mrx spectacle prescription;  CTL contact lenses; OD right eye; OS left eye; OU both eyes  XT exotropia; ET esotropia; PEK punctate epithelial keratitis; PEE punctate epithelial erosions; DES dry eye syndrome; MGD meibomian gland dysfunction; ATs artificial tears; PFAT's preservative free artificial tears; [place] nuclear sclerotic cataract; PSC posterior subcapsular cataract; ERM epi-retinal membrane; PVD posterior vitreous detachment; RD retinal detachment; DM diabetes mellitus; DR diabetic retinopathy; NPDR non-proliferative diabetic retinopathy; PDR proliferative diabetic retinopathy; CSME clinically significant macular edema; DME diabetic  macular edema; dbh dot blot hemorrhages; CWS cotton wool spot; POAG primary open angle glaucoma; C/D cup-to-disc ratio; HVF humphrey visual field; GVF goldmann visual field; OCT optical coherence tomography; IOP intraocular pressure; BRVO Branch retinal vein occlusion; CRVO central retinal vein occlusion; CRAO central retinal artery occlusion; BRAO branch retinal artery occlusion; RT retinal tear; SB scleral buckle; PPV pars plana vitrectomy; VH Vitreous hemorrhage; PRP panretinal laser photocoagulation; IVK intravitreal kenalog; VMT vitreomacular traction; MH Macular hole;  NVD neovascularization of the disc; NVE neovascularization elsewhere;  AREDS age related eye disease study; ARMD age related macular degeneration; POAG primary open angle glaucoma; EBMD epithelial/anterior basement membrane dystrophy; ACIOL anterior chamber intraocular lens; IOL intraocular lens; PCIOL posterior chamber intraocular lens; Phaco/IOL phacoemulsification with intraocular lens placement; [place] photorefractive keratectomy; LASIK laser assisted in situ keratomileusis; HTN hypertension; DM diabetes mellitus; COPD chronic obstructive pulmonary disease

## 2019-01-09 NOTE — Progress Notes (Deleted)
[facility] Note  01/10/2019     CHIEF COMPLAINT Patient presents for No chief complaint on file.   HISTORY OF PRESENT ILLNESS: Ana Long is a 43 y.o. female who presents to the clinic today for:     Referring physician:     HISTORICAL INFORMATION:   Selected notes from the MEDICAL RECORD NUMBER Referred for    CURRENT MEDICATIONS: No current outpatient medications on file. (Ophthalmic Drugs)   No current facility-administered medications for this visit.  (Ophthalmic Drugs)   Current Outpatient Medications (Other)  Medication Sig  . acyclovir (ZOVIRAX) 400 MG tablet TAKE 1 TABLET BY MOUTH TWICE A DAY  . albuterol (PROAIR HFA) 108 (90 Base) MCG/ACT inhaler Inhale 2 puffs into the lungs every 4 (four) hours as needed for wheezing or shortness of breath (cough). (Patient taking differently: Inhale 2 puffs into the lungs every 4 (four) hours as needed for wheezing or shortness of breath (or coughing). )  . atorvastatin (LIPITOR) 40 MG tablet Take 1 tablet (40 mg total) by mouth at bedtime.  . benzonatate (TESSALON) 200 MG capsule Take 1 capsule (200 mg total) by mouth 3 (three) times daily as needed for cough.  . Blood Glucose Monitoring Suppl (ACCU-CHEK AVIVA PLUS) w/Device KIT Please check you blood sugars 4 times a day  . dapagliflozin propanediol (FARXIGA) 5 MG TABS tablet Take 5 mg by mouth daily. (Patient taking differently: Take 5 mg by mouth 2 (two) times a day. )  . diclofenac (VOLTAREN) 75 MG EC tablet Take 1 tablet (75 mg total) by mouth 2 (two) times daily. (Patient taking differently: Take 75 mg by mouth 2 (two) times daily as needed (for pain or inflammation). )  . empagliflozin (JARDIANCE) 25 MG TABS tablet Take by mouth.  . fluconazole (DIFLUCAN) 150 MG tablet TAKE 1 TABLET BY MOUTH NOW. THEN REPEAT IN 1 WEEK  . furosemide (LASIX) 20 MG tablet Take 20 mg by mouth daily as needed for fluid or edema.   Marland Kitchen glipiZIDE (GLUCOTROL)  10 MG tablet Take 1 tablet (10 mg total) by mouth 2 (two) times daily before a meal.  . glucose blood (ACCU-CHEK SMARTVIEW) test strip TEST FOUR TIMES DAILY  . ibuprofen (ADVIL) 600 MG tablet Take 1 tablet (600 mg total) by mouth every 8 (eight) hours as needed. (Patient taking differently: Take 600 mg by mouth every 8 (eight) hours as needed for moderate pain. )  . insulin aspart (NOVOLOG) 100 UNIT/ML injection Inject 0-20 Units into the skin every 4 (four) hours. CBG 70 - 120: 0 units  CBG 121 - 150: 3 units  CBG 151 - 200: 4 units  CBG 201 - 250: 7 units  CBG 251 - 300: 11 units  CBG 301 - 350: 15 units  CBG 351 - 400: 20 units  CBG > 400 call MD  . Insulin Glargine (LANTUS) 100 UNIT/ML Solostar Pen Inject 80 Units into the skin 2 (two) times daily.  . Insulin Pen Needle (B-D UF III MINI PEN NEEDLES) 31G X 5 MM MISC USE AS DIRECTED TO INJECT INSULIN FIVE A DAY E11.65  . JANUVIA 100 MG tablet TAKE 1 TABLET BY MOUTH EVERY DAY  . lactulose (CEPHULAC) 10 g packet Take 1 packet (10 g total) by mouth daily as needed. Reported on 07/29/2015 (Patient taking differently: Take 10 g by mouth daily as needed (for constipation). )  . Lancets (ACCU-CHEK SOFT TOUCH) lancets Use as instructed  . liraglutide (  VICTOZA) 18 MG/3ML SOPN Take 0.60m daily for 1 week, then 1.247mdaily for 1 week, then 1.82m67maily.  . lMarland Kitchenratadine (CLARITIN) 10 MG tablet Take 1 tablet (10 mg total) by mouth 2 (two) times daily. (Patient taking differently: Take 20 mg by mouth daily as needed for allergies. )  . meloxicam (MOBIC) 15 MG tablet TAKE 1 TABLET BY MOUTH ONCE A DAY TAKE DAILY FOR TWO WEEKS, AND THEN ONLY AS NEEDED AFTERWARDS.  . mMarland Kitchenthocarbamol (ROBAXIN) 500 MG tablet Take 1 tablet (500 mg total) by mouth every 8 (eight) hours as needed. (Patient taking differently: Take 500 mg by mouth every 8 (eight) hours as needed for muscle spasms. )  . mometasone-formoterol (DULERA) 200-5 MCG/ACT AERO Inhale 2 puffs into the lungs 2 (two)  times daily.  . montelukast (SINGULAIR) 10 MG tablet Take 1 tablet (10 mg total) by mouth at bedtime.  . nitroGLYCERIN (NITRODUR - DOSED IN MG/24 HR) 0.2 mg/hr patch Apply 1/4th patch to affected achilles, change daily  . omeprazole (PRILOSEC) 20 MG capsule Take 1 capsule (20 mg total) by mouth daily before breakfast.   No current facility-administered medications for this visit.  (Other)      REVIEW OF SYSTEMS:    ALLERGIES Allergies  Allergen Reactions  . Bee Venom Anaphylaxis and Swelling  . Wasp Venom Anaphylaxis and Swelling  . Citrus Hives and Itching  . Latex Hives and Swelling  . Penicillins Hives and Itching    Has patient had a PCN reaction causing immediate rash, facial/tongue/throat swelling, SOB or lightheadedness with hypotension: Yes Has patient had a PCN reaction causing severe rash involving mucus membranes or skin necrosis: Unk Has patient had a PCN reaction that required hospitalization: Was already in hosp Has patient had a PCN reaction occurring within the last 10 years: No If all of the above answers are "NO", then may proceed with Cephalosporin use.   . Soap Hives, Itching and Swelling    PUREX LAUNDRY DETERGENT  . Tomato Hives and Itching  . Banana Itching and Nausea Only  . Chocolate Itching  . Fluticasone Other (See Comments)    Per patient, makes sneeze and gag  . Hydrocodone Itching and Nausea Only  . Other Itching    Acidic foods- Itching [place]- Itching in the roof of the mouth  . Shellfish Allergy Other (See Comments)    Patient is uncertain; stated that she tolerates this (??)  . Adhesive [Tape] Itching and Rash    Allergic to  "leads"  . Doxycycline Other (See Comments)    AFFECTS JOINTS  . Metformin And Related Nausea Only, Rash, Other (See Comments) and Diarrhea    GI UPSET, also  . Red Dye Itching, Rash and Other (See Comments)    At eye doctor's office, the dilating drops- Skin breaks out, chest turned red, itching, face became  swollen    PAST MEDICAL HISTORY Past Medical History:  Diagnosis Date  . Acute nonintractable headache 12/26/2017  . Arthritis    major joints  . Asthma    prn inhaler  . Asthma exacerbation 07/25/2013  . Asthma in adult, severe persistent, with acute exacerbation 01/11/2018  . Carpal tunnel syndrome of right wrist 09/2012  . Complication of anesthesia    states is hard to wake up post-op  . Constipation 12/30/2014  . Diabetes mellitus    intolerant to Metformin  . Dry skin    hands   . Family history of anesthesia complication    mother went into  coma during c-section  . Genital herpes 01/01/2006   on acyclovir BID   . GERD (gastroesophageal reflux disease)   . Herpes genital   . Hyperglycemia 12/26/2017  . Low back pain 11/22/2012  . Lower extremity edema 10/18/2012  . Menorrhagia 11/06/2015  . Morbid obesity with BMI of 50 to 60 07/23/2009  . Obesity hypoventilation syndrome ([place]) 10/30/2012  . Obesity, morbid ([place]) 01/10/2014  . OSA on CPAP     AHI was on  11-17-12 to 120/hr, titrated to 15 cm with 3 cm EPR/   . PCOS (polycystic ovarian syndrome)   . Rheumatoid arthritis ([place])   . Right ankle pain 06/19/2015  . Right hip pain 09/04/2017  . Right shoulder pain 06/01/2017  . Seasonal allergies   . Sleep apnea, obstructive    uses CPAP nightly - had sleep study 08/22/2007   Past Surgical History:  Procedure Laterality Date  . CARPAL TUNNEL RELEASE Right 10/02/2012   Procedure: RIGHT CARPAL TUNNEL RELEASE ENDOSCOPIC;  Surgeon: Jolyn Nap, MD;  Location: [facility];  Service: Orthopedics;  Laterality: Right;  . CHOLECYSTECTOMY N/A 12/02/2014   Procedure: LAPAROSCOPIC CHOLECYSTECTOMY;  Surgeon: Armandina Gemma, MD;  Location: [facility];  Service: General;  Laterality: N/A;  . HYSTEROSCOPY W/D&C  01-08-2002    FAMILY HISTORY Family History  Problem Relation Age of Onset  . Other Mother   . Leukemia Cousin   . Other Other   . Obesity Other   . Other Other   .  Diabetes Maternal Aunt   . Heart attack Maternal Aunt   . Alzheimer's disease Maternal Uncle   . Cancer Maternal Uncle        prostate  . Diabetes Paternal Aunt   . Cancer Paternal Aunt        brain,mouth  . Alzheimer's disease Paternal Aunt   . Diabetes Paternal Uncle   . Diabetes Maternal Grandmother   . Diabetes Maternal Grandfather     SOCIAL HISTORY Social History   Tobacco Use  . Smoking status: Former Smoker    Years: 10.00    Quit date: 05/24/2010    Years since quitting: 8.6  . Smokeless tobacco: Never Used  Substance Use Topics  . Alcohol use: Yes    Alcohol/week: 0.0 standard drinks    Comment: socially  . Drug use: No         OPHTHALMIC EXAM:  Not recorded      IMAGING AND PROCEDURES  Imaging and Procedures for _0 @           ASSESSMENT/PLAN:  No diagnosis found.  1.  2.  3.  Ophthalmic Meds Ordered this visit:  No orders of the defined types were placed in this encounter.      No follow-ups on file.  There are no Patient Instructions on file for this visit.   Explained the diagnoses, plan, and follow up with the patient and they expressed understanding.  Patient expressed understanding of the importance of proper follow up care.   This document serves as a record of services personally performed by Gardiner Sleeper, MD, PhD. It was created on their behalf by Roselee Nova, COMT. The creation of this record is the provider's dictation and/or activities during the visit.  Electronically signed by: Roselee Nova, COMT 01/09/19 11:34 AM     Gardiner Sleeper, M.D., Ph.D. Diseases & Surgery of the Retina and Vitreous [facility]: M myopia (nearsighted);  A astigmatism; H hyperopia (farsighted); P presbyopia; Mrx spectacle prescription;  CTL contact lenses; OD right eye; OS left eye; OU both eyes  XT exotropia; ET esotropia; PEK punctate epithelial keratitis; PEE punctate epithelial  erosions; DES dry eye syndrome; MGD meibomian gland dysfunction; ATs artificial tears; PFAT's preservative free artificial tears; [place] nuclear sclerotic cataract; PSC posterior subcapsular cataract; ERM epi-retinal membrane; PVD posterior vitreous detachment; RD retinal detachment; DM diabetes mellitus; DR diabetic retinopathy; NPDR non-proliferative diabetic retinopathy; PDR proliferative diabetic retinopathy; CSME clinically significant macular edema; DME diabetic macular edema; dbh dot blot hemorrhages; CWS cotton wool spot; POAG primary open angle glaucoma; C/D cup-to-disc ratio; HVF humphrey visual field; GVF goldmann visual field; OCT optical coherence tomography; IOP intraocular pressure; BRVO Branch retinal vein occlusion; CRVO central retinal vein occlusion; CRAO central retinal artery occlusion; BRAO branch retinal artery occlusion; RT retinal tear; SB scleral buckle; PPV pars plana vitrectomy; VH Vitreous hemorrhage; PRP panretinal laser photocoagulation; IVK intravitreal kenalog; VMT vitreomacular traction; MH Macular hole;  NVD neovascularization of the disc; NVE neovascularization elsewhere; AREDS age related eye disease study; ARMD age related macular degeneration; POAG primary open angle glaucoma; EBMD epithelial/anterior basement membrane dystrophy; ACIOL anterior chamber intraocular lens; IOL intraocular lens; PCIOL posterior chamber intraocular lens; Phaco/IOL phacoemulsification with intraocular lens placement; [place] photorefractive keratectomy; LASIK laser assisted in situ keratomileusis; HTN hypertension; DM diabetes mellitus; COPD chronic obstructive pulmonary disease

## 2019-01-10 ENCOUNTER — Encounter (INDEPENDENT_AMBULATORY_CARE_PROVIDER_SITE_OTHER): Payer: Self-pay | Admitting: Ophthalmology

## 2019-01-10 ENCOUNTER — Other Ambulatory Visit: Payer: Self-pay

## 2019-01-10 ENCOUNTER — Ambulatory Visit (INDEPENDENT_AMBULATORY_CARE_PROVIDER_SITE_OTHER): Payer: Medicaid Other | Admitting: Ophthalmology

## 2019-01-10 DIAGNOSIS — E113313 Type 2 diabetes mellitus with moderate nonproliferative diabetic retinopathy with macular edema, bilateral: Secondary | ICD-10-CM

## 2019-01-10 DIAGNOSIS — H35033 Hypertensive retinopathy, bilateral: Secondary | ICD-10-CM

## 2019-01-10 DIAGNOSIS — H3581 Retinal edema: Secondary | ICD-10-CM | POA: Diagnosis not present

## 2019-01-10 DIAGNOSIS — I10 Essential (primary) hypertension: Secondary | ICD-10-CM

## 2019-01-10 DIAGNOSIS — H2513 Age-related nuclear cataract, bilateral: Secondary | ICD-10-CM

## 2019-01-11 ENCOUNTER — Encounter (INDEPENDENT_AMBULATORY_CARE_PROVIDER_SITE_OTHER): Payer: Self-pay | Admitting: Ophthalmology

## 2019-01-25 ENCOUNTER — Encounter: Payer: Self-pay | Admitting: Family Medicine

## 2019-01-25 ENCOUNTER — Ambulatory Visit: Payer: Medicaid Other | Admitting: Family Medicine

## 2019-01-25 ENCOUNTER — Telehealth: Payer: Self-pay

## 2019-01-25 ENCOUNTER — Other Ambulatory Visit: Payer: Self-pay

## 2019-01-25 VITALS — BP 118/80 | HR 98 | Temp 97.2°F | Ht 62.0 in | Wt 298.8 lb

## 2019-01-25 DIAGNOSIS — G4733 Obstructive sleep apnea (adult) (pediatric): Secondary | ICD-10-CM | POA: Diagnosis not present

## 2019-01-25 DIAGNOSIS — Z9989 Dependence on other enabling machines and devices: Secondary | ICD-10-CM | POA: Diagnosis not present

## 2019-01-25 NOTE — Patient Instructions (Signed)
Continue CPAP nightly and for greater than 4 hours each night   Follow up in 6 months, sooner if needed   Sleep Apnea Sleep apnea affects breathing during sleep. It causes breathing to stop for a short time or to become shallow. It can also increase the risk of:  Heart attack.  Stroke.  Being very overweight (obese).  Diabetes.  Heart failure.  Irregular heartbeat. The goal of treatment is to help you breathe normally again. What are the causes? There are three kinds of sleep apnea:  Obstructive sleep apnea. This is caused by a blocked or collapsed airway.  Central sleep apnea. This happens when the brain does not send the right signals to the muscles that control breathing.  Mixed sleep apnea. This is a combination of obstructive and central sleep apnea. The most common cause of this condition is a collapsed or blocked airway. This can happen if:  Your throat muscles are too relaxed.  Your tongue and tonsils are too large.  You are overweight.  Your airway is too small. What increases the risk?  Being overweight.  Smoking.  Having a small airway.  Being older.  Being female.  Drinking alcohol.  Taking medicines to calm yourself (sedatives or tranquilizers).  Having family members with the condition. What are the signs or symptoms?  Trouble staying asleep.  Being sleepy or tired during the day.  Getting angry a lot.  Loud snoring.  Headaches in the morning.  Not being able to focus your mind (concentrate).  Forgetting things.  Less interest in sex.  Mood swings.  Personality changes.  Feelings of sadness (depression).  Waking up a lot during the night to pee (urinate).  Dry mouth.  Sore throat. How is this diagnosed?  Your medical history.  A physical exam.  A test that is done when you are sleeping (sleep study). The test is most often done in a sleep lab but may also be done at home. How is this treated?   Sleeping on your  side.  Using a medicine to get rid of mucus in your nose (decongestant).  Avoiding the use of alcohol, medicines to help you relax, or certain pain medicines (narcotics).  Losing weight, if needed.  Changing your diet.  Not smoking.  Using a machine to open your airway while you sleep, such as: ? An oral appliance. This is a mouthpiece that shifts your lower jaw forward. ? A CPAP device. This device blows air through a mask when you breathe out (exhale). ? An EPAP device. This has valves that you put in each nostril. ? A BPAP device. This device blows air through a mask when you breathe in (inhale) and breathe out.  Having surgery if other treatments do not work. It is important to get treatment for sleep apnea. Without treatment, it can lead to:  High blood pressure.  Coronary artery disease.  In men, not being able to have an erection (impotence).  Reduced thinking ability. Follow these instructions at home: Lifestyle  Make changes that your doctor recommends.  Eat a healthy diet.  Lose weight if needed.  Avoid alcohol, medicines to help you relax, and some pain medicines.  Do not use any products that contain nicotine or tobacco, such as cigarettes, e-cigarettes, and chewing tobacco. If you need help quitting, ask your doctor. General instructions  Take over-the-counter and prescription medicines only as told by your doctor.  If you were given a machine to use while you sleep, use it  only as told by your doctor.  If you are having surgery, make sure to tell your doctor you have sleep apnea. You may need to bring your device with you.  Keep all follow-up visits as told by your doctor. This is important. Contact a doctor if:  The machine that you were given to use during sleep bothers you or does not seem to be working.  You do not get better.  You get worse. Get help right away if:  Your chest hurts.  You have trouble breathing in enough air.  You have  an uncomfortable feeling in your back, arms, or stomach.  You have trouble talking.  One side of your body feels weak.  A part of your face is hanging down. These symptoms may be an emergency. Do not wait to see if the symptoms will go away. Get medical help right away. Call your local emergency services (911 in the [place] Do not drive yourself to the hospital. Summary  This condition affects breathing during sleep.  The most common cause is a collapsed or blocked airway.  The goal of treatment is to help you breathe normally while you sleep. This information is not intended to replace advice given to you by your health care provider. Make sure you discuss any questions you have with your health care provider. Document Released: 11/18/2007 Document Revised: 11/25/2017 Document Reviewed: 10/04/2017 Elsevier Patient Education  2020 [employer].

## 2019-01-25 NOTE — Telephone Encounter (Signed)
Sent a Warden/ranger to [facility] to for CPAP supplies. Awaiting supplies

## 2019-01-25 NOTE — Progress Notes (Signed)
PATIENT: Ana Long DOB: [birth year]  REASON FOR VISIT: follow up HISTORY FROM: patient  Chief Complaint  Patient presents with   Follow-up    new tx, alone states cpap works well.     HISTORY OF PRESENT ILLNESS: Today 01/25/19 Ana Long is a 43 y.o. female here today for follow up for OSA on CPAP.   Compliance report dated 12/25/2018 through 01/23/2019 reveals that she has used CPAP every day the last 30 days for compliance of 100%.  She has used CPAP greater than 4 hours 18 of the last 30 days for compliance of 60%.  Average usage was 4 hours and 32 minutes.  Residual AHI is 6.9 on 4 to 15 cm of water and an EPR of 3.  There was no significant leak noted.   HISTORY: (copied from my note on 10/25/2018)  Ana Long is a 43 y.o. female here today for follow up for OSA on CPAP.  She has been doing better with compliance since last being seen.  Compliance report dated 09/25/2018 through 10/24/2018 reveals that he is using CPAP 25 out of the last 30 days for compliance of 83%.  She has used CPAP greater than 4 hours 16 of those days for compliance of 53%.  Average usage was 4 hours and 19 minutes.  AHI was 12.8 on 4 to 15 cm of water.  There was no significant leak.  Pressure in the 95th percentile at 13.8.  She is tolerating pressures.  HISTORY: (copied from my note on 09/13/2018)  Ana Long a 43 y.o.femalehere today for follow up for OSA on CPAP.She reports that she has done better with compliance. Unfortunately, she did have a hospitalization recently for COVID that inhibited her ability to use CPAP. Since returning home she has been able to use CPAP nightly. She is continuing to work on greater than 4 hours usage. Compliance report dated 08/14/2018 through 09/12/2018 reveals that she has used her machine 23 out of the last 30 days for compliance of 77%. 10 of those days she used her machine greater than 4 hours for compliance of 33%. AHI remains  elevated at 10.7 on 4 to 15 cm of water and an EPR of 3. There was no significant leak.  Today she was admitted to [facility] for + COVID testing on 08/28/2018 and discharged on 09/02/2018.She reports being in quarantine until 09/11/2018. She continues to have little energy and a persistent cough. She is currently being treated for PNA associated with COVID.She is taking multiple steroid medications. She has completed course of antibiotics.  She reports that as she entered our office today her daughter pointed out that she had a tick attached to the backside of her right upper arm. She has not noted this to and is unsure how long it has been there. While in the office with me today she called primary care and scheduled an appointment for removal and possible treatment if indicated.  HISTORY: (copied frommynote on 06/13/2018)location  Ana A Hereford-Wardis a 43 y.o.femalefor follow up of OSA on CPAP.Ana Long admits that she has not used her machine regularly. She was having some trouble with shoulder pain and unable to sleep well at night. For the past week she has been sleeping better. She had a steroid injection placed last week. She states that since she has used her CPAP machine every night and for greater than 4 hours each night.     History (copied from Dr Dohmeier's note  on 03/15/2018)  Ana Long a 43 y.o.female, seen here as in a referral from Dr. Regenia Skeeter a sleep evaluation. [name] patient of our sleep practice when she was under Dr. Dimitri Ped care, I performed a split-night polysomnography with her on 17 November 2012 at the time she had endorsed the Epworth Sleepiness Scale at 18 points and had a BMI of 57, she was short of breath at rest. Her AHI was 120of the highest apnea and hypopnea indecesseen in our care. The patient received an S9 Elite CPAP machine after she had been titrated to a pressure of 15 cmH2O with  3 cm EPR she also used a Swift fracture interface at the time. She continued to use her CPAP until recently but it broke. As far as I can see she has no compliance data for November which was when she was hospitalized. During her hospitalization it was evident that she had severe hypoxemia whenever she slept her oxygen levels desaturated. Looking at her last compliance report for the last 30 days she has been only 7% or 2 days able to use her CPAP machine for longer than 4 hours, so this is a poor compliance but she assured me that her machine is not working. She does no longer have a fresh mask or interface that would seal. The residual AHI is between 11.8 and 9.6, she has high air leaks with a maximum up to 61.8 L/min. In other words her download are invalid. Patient needs an urgent return for a CPAP titration also to see if sleep hypoxemia is sufficiently treated with CPAP alone or if she has need for oxygen.  She has many other chronic medical problems as I can see under the progress notes that were cosigned by Oval Linsey, MD. Polycystic ovarian syndrome rheumatoid arthritis, hyperglycemia, gastroesophageal reflux disease, diabetes mellitus type 2, asthma exacerbation in adult severe and persistent, shortness of breath, major joint osteoarthritis, acute non-intractable headaches, shoulder pain back pain ankle pain hip pain. Her current weight exceeds 330 pounds, her diabetes was considered uncontrolled.  She currently endorsed the fatigue severity score 45 points out of 63, the Epworth Sleepiness Scale was endorsed at 19 out of 24 points.  Chief complaint according to patient :" I have dropped my oxygen sats in the hospital? Had a severe asthma attack at the time, too.  Sleep habits are as follows: Dinner time is 7 Pm and bedtime is at 2-3 AM, former third shift worker- now disabled. she spends her evenings after dinner with dinner prep for the next day, homework for the children and  laundry. The bedroom is either cool, nor quiet but dark. She has a Warehouse manager ( !) with a night light in her bedroom. Uses albuterol and breathing treatment at bedtime.  She has nocturia times 2-3 , she sleeps on her elevated bed- supine ,on multiple pillows.  Has orthopneoa, children report her snoring, pausing , her chest rattling. She wakes at 5 AM to get her children to school, who are 1, 53, 62 years old.  Sleep medical history and family sleep history:OSA severe for a long time, see previous sleep study.  Family history of OSA on maternal side, 2 uncles, one aunt and cousins. Paternal aunts and father himself.  Social history:Non smoker, raises 3 children , but has guardianship over 3 children, mother  She would like to adopt. No alcohol.   REVIEW OF SYSTEMS: Out of a complete 14 system review of symptoms, the patient complains  only of the following symptoms, and all other reviewed systems are negative.  ALLERGIES: Allergies  Allergen Reactions   Bee Venom Anaphylaxis and Swelling   Wasp Venom Anaphylaxis and Swelling   Citrus Hives and Itching   Latex Hives and Swelling   Penicillins Hives and Itching    Has patient had a PCN reaction causing immediate rash, facial/tongue/throat swelling, SOB or lightheadedness with hypotension: Yes Has patient had a PCN reaction causing severe rash involving mucus membranes or skin necrosis: Unk Has patient had a PCN reaction that required hospitalization: Was already in hosp Has patient had a PCN reaction occurring within the last 10 years: No If all of the above answers are "NO", then may proceed with Cephalosporin use.    Shellfish Allergy Other (See Comments) and Swelling    Patient is uncertain; stated that she tolerates this (??)   Soap Hives, Itching and Swelling    PUREX LAUNDRY DETERGENT   Tomato Hives and Itching   Banana Itching and Nausea Only   Chocolate Itching   Fluticasone Other (See Comments)     Per patient, makes sneeze and gag   Hydrocodone Itching and Nausea Only   Other Itching    Acidic foods- Itching [place]- Itching in the roof of the mouth   Adhesive [Tape] Itching and Rash    Allergic to  "leads"   Doxycycline Other (See Comments)    AFFECTS JOINTS   Metformin And Related Nausea Only, Rash, Other (See Comments) and Diarrhea    GI UPSET, also   Red Dye Itching, Rash and Other (See Comments)    At eye doctor's office, the dilating drops- Skin breaks out, chest turned red, itching, face became swollen    HOME MEDICATIONS: Outpatient Medications Prior to Visit  Medication Sig Dispense Refill   acyclovir (ZOVIRAX) 400 MG tablet TAKE 1 TABLET BY MOUTH TWICE A DAY 60 tablet 2   albuterol (PROAIR HFA) 108 (90 Base) MCG/ACT inhaler Inhale 2 puffs into the lungs every 4 (four) hours as needed for wheezing or shortness of breath (cough). (Patient taking differently: Inhale 2 puffs into the lungs every 4 (four) hours as needed for wheezing or shortness of breath (or coughing). ) 6.7 g 2   atorvastatin (LIPITOR) 40 MG tablet Take 1 tablet (40 mg total) by mouth at bedtime. 90 tablet 1   Blood Glucose Monitoring Suppl (ACCU-CHEK AVIVA PLUS) w/Device KIT Please check you blood sugars 4 times a day 1 kit 0   dapagliflozin propanediol (FARXIGA) 5 MG TABS tablet Take 5 mg by mouth daily. (Patient taking differently: Take 5 mg by mouth 2 (two) times a day. ) 90 tablet 1   diclofenac (VOLTAREN) 75 MG EC tablet Take 1 tablet (75 mg total) by mouth 2 (two) times daily. (Patient taking differently: Take 75 mg by mouth 2 (two) times daily as needed (for pain or inflammation). ) 60 tablet 1   fluconazole (DIFLUCAN) 150 MG tablet TAKE 1 TABLET BY MOUTH NOW. THEN REPEAT IN 1 WEEK     furosemide (LASIX) 20 MG tablet Take 20 mg by mouth daily as needed for fluid or edema.      glucose blood (ACCU-CHEK SMARTVIEW) test strip TEST FOUR TIMES DAILY 150 each 6   ibuprofen (ADVIL) 600 MG  tablet Take 1 tablet (600 mg total) by mouth every 8 (eight) hours as needed. (Patient taking differently: Take 600 mg by mouth every 8 (eight) hours as needed for moderate pain. ) 90 tablet 1  insulin aspart (NOVOLOG) 100 UNIT/ML injection Inject 0-20 Units into the skin every 4 (four) hours. CBG 70 - 120: 0 units  CBG 121 - 150: 3 units  CBG 151 - 200: 4 units  CBG 201 - 250: 7 units  CBG 251 - 300: 11 units  CBG 301 - 350: 15 units  CBG 351 - 400: 20 units  CBG > 400 call MD (Patient taking differently: Inject 20 Units into the skin every 4 (four) hours. CBG 70 - 120: 0 units  CBG 121 - 150: 3 units  CBG 151 - 200: 4 units  CBG 201 - 250: 7 units  CBG 251 - 300: 11 units  CBG 301 - 350: 15 units  CBG 351 - 400: 20 units  CBG > 400 call MD) 10 mL 11   Insulin Pen Needle (B-D UF III MINI PEN NEEDLES) 31G X 5 MM MISC USE AS DIRECTED TO INJECT INSULIN FIVE A DAY E11.65 100 each 6   lactulose (CEPHULAC) 10 g packet Take 1 packet (10 g total) by mouth daily as needed. Reported on 07/29/2015 (Patient taking differently: Take 10 g by mouth daily as needed (for constipation). ) 30 each 0   Lancets (ACCU-CHEK SOFT TOUCH) lancets Use as instructed 100 each 12   meloxicam (MOBIC) 15 MG tablet TAKE 1 TABLET BY MOUTH ONCE A DAY TAKE DAILY FOR TWO WEEKS, AND THEN ONLY AS NEEDED AFTERWARDS.     methocarbamol (ROBAXIN) 500 MG tablet Take 1 tablet (500 mg total) by mouth every 8 (eight) hours as needed. (Patient taking differently: Take 500 mg by mouth every 8 (eight) hours as needed for muscle spasms. ) 60 tablet 1   mometasone-formoterol (DULERA) 200-5 MCG/ACT AERO Inhale 2 puffs into the lungs 2 (two) times daily. 13 Inhaler 1   montelukast (SINGULAIR) 10 MG tablet Take 1 tablet (10 mg total) by mouth at bedtime. 90 tablet 1   nitroGLYCERIN (NITRODUR - DOSED IN MG/24 HR) 0.2 mg/hr patch Apply 1/4th patch to affected achilles, change daily 30 patch 1   omeprazole (PRILOSEC) 20 MG capsule Take 1  capsule (20 mg total) by mouth daily before breakfast. 90 capsule 2   benzonatate (TESSALON) 200 MG capsule Take 1 capsule (200 mg total) by mouth 3 (three) times daily as needed for cough. (Patient not taking: Reported on 01/25/2019) 20 capsule 0   Insulin Glargine (LANTUS) 100 UNIT/ML Solostar Pen Inject 80 Units into the skin 2 (two) times daily. 15 pen 0   JANUVIA 100 MG tablet TAKE 1 TABLET BY MOUTH EVERY DAY 90 tablet 3   liraglutide (VICTOZA) 18 MG/3ML SOPN Take 0.36m daily for 1 week, then 1.232mdaily for 1 week, then 1.27m11maily.     loratadine (CLARITIN) 10 MG tablet Take 1 tablet (10 mg total) by mouth 2 (two) times daily. (Patient not taking: Reported on 01/25/2019) 180 tablet 1   empagliflozin (JARDIANCE) 25 MG TABS tablet Take by mouth.     glipiZIDE (GLUCOTROL) 10 MG tablet Take 1 tablet (10 mg total) by mouth 2 (two) times daily before a meal. 60 tablet 3   No facility-administered medications prior to visit.     PAST MEDICAL HISTORY: Past Medical History:  Diagnosis Date   Acute nonintractable headache 12/26/2017   Arthritis    major joints   Asthma    prn inhaler   Asthma exacerbation 07/25/2013   Asthma in adult, severe persistent, with acute exacerbation 01/11/2018   Carpal tunnel syndrome of  right wrist 06/4980   Complication of anesthesia    states is hard to wake up post-op   Constipation 12/30/2014   Diabetes mellitus    intolerant to Metformin   Dry skin    hands    Family history of anesthesia complication    mother went into coma during c-section   Genital herpes 01/01/2006   on acyclovir BID    GERD (gastroesophageal reflux disease)    Herpes genital    Hyperglycemia 12/26/2017   Low back pain 11/22/2012   Lower extremity edema 10/18/2012   Menorrhagia 11/06/2015   Morbid obesity with BMI of 50 to 60 07/23/2009   Obesity hypoventilation syndrome ([place]) 10/30/2012   Obesity, morbid ([place]) 01/10/2014   OSA on CPAP     AHI was on   11-17-12 to 120/hr, titrated to 15 cm with 3 cm EPR/    PCOS (polycystic ovarian syndrome)    Rheumatoid arthritis ([place])    Right ankle pain 06/19/2015   Right hip pain 09/04/2017   Right shoulder pain 06/01/2017   Seasonal allergies    Sleep apnea, obstructive    uses CPAP nightly - had sleep study 08/22/2007    PAST SURGICAL HISTORY: Past Surgical History:  Procedure Laterality Date   CARPAL TUNNEL RELEASE Right 10/02/2012   Procedure: RIGHT CARPAL TUNNEL RELEASE ENDOSCOPIC;  Surgeon: Jolyn Nap, MD;  Location: [facility];  Service: Orthopedics;  Laterality: Right;   CHOLECYSTECTOMY N/A 12/02/2014   Procedure: LAPAROSCOPIC CHOLECYSTECTOMY;  Surgeon: Armandina Gemma, MD;  Location: [facility];  Service: General;  Laterality: N/A;   HYSTEROSCOPY W/D&C  01-08-2002    FAMILY HISTORY: Family History  Problem Relation Age of Onset   Other Mother    Leukemia Cousin    Other Other    Obesity Other    Other Other    Diabetes Maternal Aunt    Heart attack Maternal Aunt    Alzheimer's disease Maternal Uncle    Cancer Maternal Uncle        prostate   Diabetes Paternal Aunt    Cancer Paternal Aunt        brain,mouth   Alzheimer's disease Paternal Aunt    Diabetes Paternal Uncle    Diabetes Maternal Grandmother    Diabetes Maternal Grandfather     SOCIAL HISTORY: Social History   Socioeconomic History   Marital status: Divorced    Spouse name: Not on file   Number of children: 2   Years of education: 14   Highest education level: Not on file  Occupational History   Occupation: UNEMPLOYED    Fish farm manager: UNEMPLOYED  Social Designer, fashion/clothing strain: Not on file   Food insecurity    Worry: Not on file    Inability: Not on file   Transportation needs    Medical: Not on file    Non-medical: Not on file  Tobacco Use   Smoking status: Former Smoker    Years: 10.00    Quit date: 05/24/2010    Years since quitting: 8.6    Smokeless tobacco: Never Used  Substance and Sexual Activity   Alcohol use: Yes    Alcohol/week: 0.0 standard drinks    Comment: socially   Drug use: No   Sexual activity: Yes    Birth control/protection: None    Comment: Lives w/a partner  Lifestyle   Physical activity    Days per week: Not on file    Minutes per session: Not on file  Stress: Not on file  Relationships   Social connections    Talks on phone: Not on file    Gets together: Not on file    Attends religious service: Not on file    Active member of club or organization: Not on file    Attends meetings of clubs or organizations: Not on file    Relationship status: Not on file   Intimate partner violence    Fear of current or ex partner: Not on file    Emotionally abused: Not on file    Physically abused: Not on file    Forced sexual activity: Not on file  Other Topics Concern   Not on file  Social History Narrative   Single, but lives with a partner   Has 2 children and cares for an infant during the day            PHYSICAL EXAM  Vitals:   01/25/19 1419  BP: 118/80  Pulse: 98  Temp: (!) 97.2 F (36.2 C)  Weight: 298 lb 12.8 oz (135.5 kg)  Height: 5' 2"  (1.575 m)   Body mass index is 54.65 kg/m.  Generalized: Well developed, in no acute distress  Cardiology: normal rate and rhythm, no murmur noted Neurological examination  Mentation: Alert oriented to time, place, history taking. Follows all commands speech and language fluent Cranial nerve II-XII: Pupils were equal round reactive to light. Extraocular movements were full, visual field were full on confrontational test. Facial sensation and strength were normal. Uvula tongue midline. Head turning and shoulder shrug  were normal and symmetric. Motor: The motor testing reveals 5 over 5 strength of all 4 extremities. Good symmetric motor tone is noted throughout.  Sensory: Sensory testing is intact to soft touch on all 4 extremities. No  evidence of extinction is noted.  Coordination: Cerebellar testing reveals good finger-nose-finger and heel-to-shin bilaterally.  Gait and station: Gait is normal. Tandem gait is normal. Romberg is negative. No drift is seen.  Reflexes: Deep tendon reflexes are symmetric and normal bilaterally.   DIAGNOSTIC DATA (LABS, IMAGING, TESTING) - I reviewed patient records, labs, notes, testing and imaging myself where available.  No flowsheet data found.   Lab Results  Component Value Date   WBC 12.7 (H) 09/02/2018   HGB 12.4 09/02/2018   HCT 40.9 09/02/2018   MCV 81.2 09/02/2018   PLT 335 09/02/2018      Component Value Date/Time   NA 138 09/02/2018 0540   NA 139 10/21/2016 1525   K 3.6 09/02/2018 0540   CL 101 09/02/2018 0540   CO2 26 09/02/2018 0540   GLUCOSE 179 (H) 09/02/2018 0540   BUN 18 09/02/2018 0540   BUN 6 10/21/2016 1525   CREATININE 0.62 09/02/2018 0540   CREATININE 0.60 07/24/2014 1550   CALCIUM 8.7 (L) 09/02/2018 0540   PROT 6.8 09/02/2018 0540   PROT 6.9 12/30/2014 1641   ALBUMIN 3.0 (L) 09/02/2018 0540   ALBUMIN 3.9 12/30/2014 1641   AST 13 (L) 09/02/2018 0540   ALT 17 09/02/2018 0540   ALKPHOS 40 09/02/2018 0540   BILITOT 0.3 09/02/2018 0540   BILITOT 0.3 12/30/2014 1641   GFRNONAA >60 09/02/2018 0540   GFRNONAA >89 07/24/2014 1550   GFRAA >60 09/02/2018 0540   GFRAA >89 07/24/2014 1550   Lab Results  Component Value Date   CHOL 148 07/24/2014   HDL 41 (L) 07/24/2014   LDLCALC 85 07/24/2014   TRIG 112 07/24/2014   CHOLHDL 3.6 07/24/2014  Lab Results  Component Value Date   HGBA1C 12.0 (H) 08/29/2018   Lab Results  Component Value Date   ASUORVIF53 794 02/19/2008   Lab Results  Component Value Date   TSH 1.040 11/16/2017       ASSESSMENT AND PLAN 43 y.o. year old female  has a past medical history of Acute nonintractable headache (12/26/2017), Arthritis, Asthma, Asthma exacerbation (07/25/2013), Asthma in adult, severe persistent, with  acute exacerbation (01/11/2018), Carpal tunnel syndrome of right wrist (04/2759), Complication of anesthesia, Constipation (12/30/2014), Diabetes mellitus, Dry skin, Family history of anesthesia complication, Genital herpes (01/01/2006), GERD (gastroesophageal reflux disease), Herpes genital, Hyperglycemia (12/26/2017), Low back pain (11/22/2012), Lower extremity edema (10/18/2012), Menorrhagia (11/06/2015), Morbid obesity with BMI of 50 to 60 (07/23/2009), Obesity hypoventilation syndrome ([place]) (10/30/2012), Obesity, morbid ([place]) (01/10/2014), OSA on CPAP, PCOS (polycystic ovarian syndrome), Rheumatoid arthritis ([place]), Right ankle pain (06/19/2015), Right hip pain (09/04/2017), Right shoulder pain (06/01/2017), Seasonal allergies, and Sleep apnea, obstructive. here with     ICD-10-CM   1. OSA on CPAP  G47.33 For home use only DME continuous positive airway pressure (CPAP)   Z99.89     Tierra is doing very well with nightly compliance of CPAP therapy.  She continues to work towards a goal of a minimum of 4 hours each night.  Currently at 60% compliant with 4-hour usage.  I have reminded her of risk of untreated sleep apnea.  I have advised that she use CPAP anytime she is asleep.  On average she says she is sleeping about 4 hours.  Residual AHI has improved but remains elevated at 6.9.  I have encouraged her to continue working on compliance.  Nightly use and a minimum of 4-hour usage each night advised.  She will continue to monitor for green smiley face on CPAP report.  She will follow-up with me in 6 months, sooner if needed.  She verbalizes understanding and agreement with this plan.   Orders Placed This Encounter  Procedures   For home use only DME continuous positive airway pressure (CPAP)    Supplies    Order Specific Question:   Length of Need    Answer:   Lif[employer]    Order Specific Question:   Patient has OSA or probable OSA    Answer:   Yes    Order Specific Question:   Is the patient currently using  CPAP in the home    Answer:   Yes    Order Specific Question:   Settings    Answer:   Other see comments    Order Specific Question:   CPAP supplies needed    Answer:   Mask, headgear, cushions, filters, heated tubing and water chamber     No orders of the defined types were placed in this encounter.     I spent 15 minutes with the patient. 50% of this time was spent counseling and educating patient on plan of care and medications.     Debbora Presto, FNP-C 01/25/2019, 2:38 PM [facility] [address], [place], [place] [address] [phone]

## 2019-01-29 NOTE — Telephone Encounter (Signed)
Received confirmation on CPAP order via [employer].

## 2019-02-06 NOTE — Progress Notes (Signed)
[facility] Note  02/07/2019     CHIEF COMPLAINT Patient presents for Retina Follow Up   HISTORY OF PRESENT ILLNESS: Ana Long is a 43 y.o. female who presents to the clinic today for:   HPI    Retina Follow Up    Patient presents with  Diabetic Retinopathy.  In both eyes.  This started 4 weeks ago.  Severity is moderate.  I, the attending physician,  performed the HPI with the patient and updated documentation appropriately.          Comments    Patient here for 4 weeks retina follow up for NPDR OU. Patient states vision changes from day to day. Has to switch between glasses. No eye pain. Has discharge OD OU itches.  Has lost some weight.        Last edited by Bernarda Caffey, MD on 02/07/2019  3:48 PM. (History)    pt states she was referred back here by Dr. [name]. She doesn't remember being here last year at all.  Referring physician: [name] MD [place], [place] [address]  HISTORICAL INFORMATION:   Selected notes from the [facility] Re-referred by Dr. [name] for DM exam LEE: 08.10.20 (S. [place]) [BCVA: OD: 20/25 OS: 20/25] Ocular Hx-cataracts OU, DES OU, allergic conjunctivitis OU, glaucoma suspect, chalazion PMH-DM (takes Januvia, Lantus), rheumatoid arthritis, hx of migraines, herpes, last a1c 11.2    CURRENT MEDICATIONS: No current outpatient medications on file. (Ophthalmic Drugs)   No current facility-administered medications for this visit. (Ophthalmic Drugs)   Current Outpatient Medications (Other)  Medication Sig  . acyclovir (ZOVIRAX) 400 MG tablet TAKE 1 TABLET BY MOUTH TWICE A DAY  . albuterol (PROAIR HFA) 108 (90 Base) MCG/ACT inhaler Inhale 2 puffs into the lungs every 4 (four) hours as needed for wheezing or shortness of breath (cough). (Patient taking differently: Inhale 2 puffs into the lungs every 4 (four) hours as needed for wheezing or shortness of breath (or coughing).  )  . atorvastatin (LIPITOR) 40 MG tablet Take 1 tablet (40 mg total) by mouth at bedtime.  . benzonatate (TESSALON) 200 MG capsule Take 1 capsule (200 mg total) by mouth 3 (three) times daily as needed for cough. (Patient not taking: Reported on 01/25/2019)  . Blood Glucose Monitoring Suppl (ACCU-CHEK AVIVA PLUS) w/Device KIT Please check you blood sugars 4 times a day  . dapagliflozin propanediol (FARXIGA) 5 MG TABS tablet Take 5 mg by mouth daily. (Patient taking differently: Take 5 mg by mouth 2 (two) times a day. )  . diclofenac (VOLTAREN) 75 MG EC tablet Take 1 tablet (75 mg total) by mouth 2 (two) times daily. (Patient taking differently: Take 75 mg by mouth 2 (two) times daily as needed (for pain or inflammation). )  . fluconazole (DIFLUCAN) 150 MG tablet TAKE 1 TABLET BY MOUTH NOW. THEN REPEAT IN 1 WEEK  . furosemide (LASIX) 20 MG tablet Take 20 mg by mouth daily as needed for fluid or edema.   Marland Kitchen glucose blood (ACCU-CHEK SMARTVIEW) test strip TEST FOUR TIMES DAILY  . ibuprofen (ADVIL) 600 MG tablet Take 1 tablet (600 mg total) by mouth every 8 (eight) hours as needed. (Patient taking differently: Take 600 mg by mouth every 8 (eight) hours as needed for moderate pain. )  . insulin aspart (NOVOLOG) 100 UNIT/ML injection Inject 0-20 Units into the skin every 4 (four) hours. CBG 70 - 120: 0 units  CBG 121 - 150: 3 units  CBG 151 - 200: 4 units  CBG 201 - 250: 7 units  CBG 251 - 300: 11 units  CBG 301 - 350: 15 units  CBG 351 - 400: 20 units  CBG > 400 call MD (Patient taking differently: Inject 20 Units into the skin every 4 (four) hours. CBG 70 - 120: 0 units  CBG 121 - 150: 3 units  CBG 151 - 200: 4 units  CBG 201 - 250: 7 units  CBG 251 - 300: 11 units  CBG 301 - 350: 15 units  CBG 351 - 400: 20 units  CBG > 400 call MD)  . Insulin Glargine (LANTUS) 100 UNIT/ML Solostar Pen Inject 80 Units into the skin 2 (two) times daily.  . Insulin Pen Needle (B-D UF III MINI PEN NEEDLES) 31G X 5  MM MISC USE AS DIRECTED TO INJECT INSULIN FIVE A DAY E11.65  . JANUVIA 100 MG tablet TAKE 1 TABLET BY MOUTH EVERY DAY  . lactulose (CEPHULAC) 10 g packet Take 1 packet (10 g total) by mouth daily as needed. Reported on 07/29/2015 (Patient taking differently: Take 10 g by mouth daily as needed (for constipation). )  . Lancets (ACCU-CHEK SOFT TOUCH) lancets Use as instructed  . liraglutide (VICTOZA) 18 MG/3ML SOPN Take 0.848m daily for 1 week, then 1.267mdaily for 1 week, then 1.48m13maily.  . lMarland Kitchenratadine (CLARITIN) 10 MG tablet Take 1 tablet (10 mg total) by mouth 2 (two) times daily. (Patient not taking: Reported on 01/25/2019)  . meloxicam (MOBIC) 15 MG tablet TAKE 1 TABLET BY MOUTH ONCE A DAY TAKE DAILY FOR TWO WEEKS, AND THEN ONLY AS NEEDED AFTERWARDS.  . mMarland Kitchenthocarbamol (ROBAXIN) 500 MG tablet Take 1 tablet (500 mg total) by mouth every 8 (eight) hours as needed. (Patient taking differently: Take 500 mg by mouth every 8 (eight) hours as needed for muscle spasms. )  . mometasone-formoterol (DULERA) 200-5 MCG/ACT AERO Inhale 2 puffs into the lungs 2 (two) times daily.  . montelukast (SINGULAIR) 10 MG tablet Take 1 tablet (10 mg total) by mouth at bedtime.  . nitroGLYCERIN (NITRODUR - DOSED IN MG/24 HR) 0.2 mg/hr patch Apply 1/4th patch to affected achilles, change daily  . omeprazole (PRILOSEC) 20 MG capsule Take 1 capsule (20 mg total) by mouth daily before breakfast.   Current Facility-Administered Medications (Other)  Medication Route  . diphenhydrAMINE (BENADRYL) injection 12.5 mg Intramuscular      REVIEW OF SYSTEMS: ROS    Positive for: Endocrine, Eyes   Negative for: Constitutional, Gastrointestinal, Neurological, Skin, Genitourinary, Musculoskeletal, HENT, Cardiovascular, Respiratory, Psychiatric, Allergic/Imm, Heme/Lymph   Last edited by ClaTheodore DemarkOA on 02/07/2019  2:21 PM. (History)       ALLERGIES Allergies  Allergen Reactions  . Bee Venom Anaphylaxis and Swelling  .  Wasp Venom Anaphylaxis and Swelling  . Citrus Hives and Itching  . Latex Hives and Swelling  . Penicillins Hives and Itching    Has patient had a PCN reaction causing immediate rash, facial/tongue/throat swelling, SOB or lightheadedness with hypotension: Yes Has patient had a PCN reaction causing severe rash involving mucus membranes or skin necrosis: Unk Has patient had a PCN reaction that required hospitalization: Was already in hosp Has patient had a PCN reaction occurring within the last 10 years: No If all of the above answers are "NO", then may proceed with Cephalosporin use.   . Shellfish Allergy Other (See Comments) and Swelling    Patient  is uncertain; stated that she tolerates this (??)  . Soap Hives, Itching and Swelling    PUREX LAUNDRY DETERGENT  . Tomato Hives and Itching  . Banana Itching and Nausea Only  . Chocolate Itching  . Fluticasone Other (See Comments)    Per patient, makes sneeze and gag  . Hydrocodone Itching and Nausea Only  . Other Itching    Acidic foods- Itching [place]- Itching in the roof of the mouth  . Adhesive [Tape] Itching and Rash    Allergic to  "leads"  . Doxycycline Other (See Comments)    AFFECTS JOINTS  . Metformin And Related Nausea Only, Rash, Other (See Comments) and Diarrhea    GI UPSET, also  . Red Dye Itching, Rash and Other (See Comments)    At eye doctor's office, the dilating drops- Skin breaks out, chest turned red, itching, face became swollen    PAST MEDICAL HISTORY Past Medical History:  Diagnosis Date  . Acute nonintractable headache 12/26/2017  . Arthritis    major joints  . Asthma    prn inhaler  . Asthma exacerbation 07/25/2013  . Asthma in adult, severe persistent, with acute exacerbation 01/11/2018  . Carpal tunnel syndrome of right wrist 09/2012  . Complication of anesthesia    states is hard to wake up post-op  . Constipation 12/30/2014  . Diabetes mellitus    intolerant to Metformin  . Dry skin    hands   .  Family history of anesthesia complication    mother went into coma during c-section  . Genital herpes 01/01/2006   on acyclovir BID   . GERD (gastroesophageal reflux disease)   . Herpes genital   . Hyperglycemia 12/26/2017  . Low back pain 11/22/2012  . Lower extremity edema 10/18/2012  . Menorrhagia 11/06/2015  . Morbid obesity with BMI of 50 to 60 07/23/2009  . Obesity hypoventilation syndrome ([place]) 10/30/2012  . Obesity, morbid ([place]) 01/10/2014  . OSA on CPAP     AHI was on  11-17-12 to 120/hr, titrated to 15 cm with 3 cm EPR/   . PCOS (polycystic ovarian syndrome)   . Rheumatoid arthritis ([place])   . Right ankle pain 06/19/2015  . Right hip pain 09/04/2017  . Right shoulder pain 06/01/2017  . Seasonal allergies   . Sleep apnea, obstructive    uses CPAP nightly - had sleep study 08/22/2007   Past Surgical History:  Procedure Laterality Date  . CARPAL TUNNEL RELEASE Right 10/02/2012   Procedure: RIGHT CARPAL TUNNEL RELEASE ENDOSCOPIC;  Surgeon: Jolyn Nap, MD;  Location: [facility];  Service: Orthopedics;  Laterality: Right;  . CHOLECYSTECTOMY N/A 12/02/2014   Procedure: LAPAROSCOPIC CHOLECYSTECTOMY;  Surgeon: Armandina Gemma, MD;  Location: [facility];  Service: General;  Laterality: N/A;  . HYSTEROSCOPY W/D&C  01-08-2002    FAMILY HISTORY Family History  Problem Relation Age of Onset  . Other Mother   . Leukemia Cousin   . Other Other   . Obesity Other   . Other Other   . Diabetes Maternal Aunt   . Heart attack Maternal Aunt   . Alzheimer's disease Maternal Uncle   . Cancer Maternal Uncle        prostate  . Diabetes Paternal Aunt   . Cancer Paternal Aunt        brain,mouth  . Alzheimer's disease Paternal Aunt   . Diabetes Paternal Uncle   . Diabetes Maternal Grandmother   . Diabetes Maternal Grandfather  SOCIAL HISTORY Social History   Tobacco Use  . Smoking status: Former Smoker    Years: 10.00    Quit date: 05/24/2010    Years since quitting: 8.7  .  Smokeless tobacco: Never Used  Substance Use Topics  . Alcohol use: Yes    Alcohol/week: 0.0 standard drinks    Comment: socially  . Drug use: No         OPHTHALMIC EXAM:  Base Eye Exam    Visual Acuity (Snellen - Linear)      Right Left   Dist cc 20/40 -2 20/25 -2   Dist ph cc 20/25 -2 20/20 -2   Correction: Glasses       Tonometry (Tonopen, 2:17 PM)      Right Left   Pressure 19 15       Pupils      Dark Light Shape React APD   Right 4 3 Round Brisk None   Left 4 3 Round Brisk None       Visual Fields (Counting fingers)      Left Right    Full Full       Extraocular Movement      Right Left    Full, Ortho Full, Ortho       Neuro/Psych    Oriented x3: Yes   Mood/Affect: Normal       Dilation    Both eyes: 1.0% Mydriacyl, 2.5% Phenylephrine @ 2:17 PM        Slit Lamp and Fundus Exam    Slit Lamp Exam      Right Left   Lids/Lashes Dermatochalasis - upper lid, mild Meibomian gland dysfunction Dermatochalasis - upper lid, mild Meibomian gland dysfunction   Conjunctiva/Sclera White and quiet White and quiet   Cornea Trace Punctate epithelial erosions Trace Punctate epithelial erosions   Anterior Chamber deep and quiet deep and quiet   Iris round and dilated, No NVI round and dilated   Lens 1+ NS, 1+CC 1+NS, 1+CC   Vitreous mild vitreous syneresis mild vitreous syneresis       Fundus Exam      Right Left   Disc Pink and sharp Pink and sharp   C/D Ratio 0.65 0.65   Macula good foveal reflex, scattered punctate exudates/MA/IRH, +cystic changes blunted foveal reflex, scatttered MA's and exudate, +cystic changes   Vessels Vascular attenuation, Tortuous, Copper wiring Vascular attenuation, Tortuous, Copper wiring   Periphery Attached, scattered MA and exudate greatest nasally Attached, scattered MA and exudate greatest nasally, Preretinal hemorrhage        Refraction    Wearing Rx      Sphere Cylinder Axis   Right -2.50 +1.50 013   Left -2.25 Sphere     Type: SVL          IMAGING AND PROCEDURES  Imaging and Procedures for @TODAY @  OCT, Retina - OU - Both Eyes       Right Eye Quality was good. Central Foveal Thickness: 275. Progression has been stable. Findings include normal foveal contour, no SRF, intraretinal fluid, intraretinal hyper-reflective material (persistent IRH/IRHM).   Left Eye Quality was good. Central Foveal Thickness: 276. Progression has been stable. Findings include intraretinal fluid, no SRF, abnormal foveal contour, intraretinal hyper-reflective material (Patches of IRF -- temporal fovea, superior to fovea, inferotemporal to fovea---persistent).   Notes *Images captured and stored on drive  Diagnosis / Impression:  +DME OU-- persistent IRF/IRHM  Clinical management:  See below  Abbreviations: NFP - Normal foveal  profile. CME - cystoid macular edema. PED - pigment epithelial detachment. IRF - intraretinal fluid. SRF - subretinal fluid. EZ - ellipsoid zone. ERM - epiretinal membrane. ORA - outer retinal atrophy. ORT - outer retinal tubulation. SRHM - subretinal hyper-reflective material        Fluorescein Angiography Optos (Transit OD)       Right Eye   Progression has no prior data. Early phase findings include microaneurysm, vascular perfusion defect. Mid/Late phase findings include microaneurysm, vascular perfusion defect, leakage (No NV).   Left Eye   Progression has no prior data. Early phase findings include microaneurysm, vascular perfusion defect. Mid/Late phase findings include leakage, microaneurysm, vascular perfusion defect (No NV).   Notes **Images stored on drive**  Impression: Moderate NPDR OU Late leaking MA OU Focal areas of capillary non-perfusion OU No NV OU       Intravitreal Injection, Pharmacologic Agent - OD - Right Eye       Time Out 02/07/2019. 3:58 PM. Confirmed correct patient, procedure, site, and patient consented.   Anesthesia Topical anesthesia was  used. Anesthetic medications included Lidocaine 2%, Proparacaine 0.5%.   Procedure Preparation included 5% betadine to ocular surface, eyelid speculum. A supplied needle was used.   Injection:  1.25 mg Bevacizumab (AVASTIN) SOLN   NDC: 85027-741-28, Lot: [id number] , Expiration date: 04/10/2018   Route: Intravitreal, Site: Right Eye, Waste: 0 mL  Post-op Post injection exam found visual acuity of at least counting fingers. The patient tolerated the procedure well. There were no complications. The patient received written and verbal post procedure care education.                 ASSESSMENT/PLAN:    ICD-10-CM   1. Moderate nonproliferative diabetic retinopathy of both eyes with macular edema associated with type 2 diabetes mellitus (HCC)  04/23/2018 Intravitreal Injection, Pharmacologic Agent - OD - Right Eye    Bevacizumab (AVASTIN) SOLN 1.25 mg  2. Retinal edema  H35.81 OCT, Retina - OU - Both Eyes  3. Essential hypertension  I10   4. Hypertensive retinopathy of both eyes  H35.033 Fluorescein Angiography Optos (Transit OD)    CANCELED: Flourescein Angiography - OD - Right Eye  5. Nuclear sclerosis of both eyes  H25.13     1,2. Moderate non-proliferative diabetic retinopathy w/ DME, OU  - pt lost to f/u from 10.23.2019 to 11.18.20 -- was supposed to return for focal laser for DME  - FA in 2019 showed leaking MA OU; no NV OU  - FA today shows late leaking MA's, non-perfusion defects; no NV OU  **of note, pt had pruritic rxn to fluorescein dye -- resolved with 25 mg IV benadryl**  - exam shows scattered MA, IRH, exudates and mild macular edema OU  - OCT shows mild diabetic macular edema OU with persistent IRF/IRHM  - BCVA good: 20/25-2 OD, 20/20-2 OS  - discussed exam and imaging findings  - recommend IVA OU, but # 1 OD first, today (12.16.20) -- OS in 1 wk  - RBA of procedure discussed, questions answered             - informed consent obtained and signed             - see  procedure note  - recommend PRP laser OU in between IVA injections for treatment of capillary nonperfusion  - F/U in 1 week for DFE, OCT and IVA OS, consider PRP OU at a later date  3,4. Hypertensive retinopathy OU  - discussed  importance of tight BP control  - monitor  5. Nuclear Sclerosis   - The symptoms of cataract, surgical options, and treatments and risks were discussed with patient.  - discussed diagnosis and progression  - under the expert management of [facility] Ordered this visit:  Meds ordered this encounter  Medications  . Bevacizumab (AVASTIN) SOLN 1.25 mg       Return in about 1 week (around 02/14/2019) for DFE, OCT, IVA OS.  There are no Patient Instructions on file for this visit.   Explained the diagnoses, plan, and follow up with the patient and they expressed understanding.  Patient expressed understanding of the importance of proper follow up care.   This document serves as a record of services personally performed by Gardiner Sleeper, MD, PhD. It was created on their behalf by Roselee Nova, COMT. The creation of this record is the provider's dictation and/or activities during the visit.  Electronically signed by: Roselee Nova, COMT 02/07/19 4:46 PM  Gardiner Sleeper, M.D., Ph.D. Diseases & Surgery of the Retina and [place] 02/07/2019   I have reviewed the above documentation for accuracy and completeness, and I agree with the above. Gardiner Sleeper, M.D., Ph.D. 02/07/19 4:46 PM    Abbreviations: M myopia (nearsighted); A astigmatism; H hyperopia (farsighted); P presbyopia; Mrx spectacle prescription;  CTL contact lenses; OD right eye; OS left eye; OU both eyes  XT exotropia; ET esotropia; PEK punctate epithelial keratitis; PEE punctate epithelial erosions; DES dry eye syndrome; MGD meibomian gland dysfunction; ATs artificial tears; PFAT's preservative free artificial tears; [place] nuclear sclerotic  cataract; PSC posterior subcapsular cataract; ERM epi-retinal membrane; PVD posterior vitreous detachment; RD retinal detachment; DM diabetes mellitus; DR diabetic retinopathy; NPDR non-proliferative diabetic retinopathy; PDR proliferative diabetic retinopathy; CSME clinically significant macular edema; DME diabetic macular edema; dbh dot blot hemorrhages; CWS cotton wool spot; POAG primary open angle glaucoma; C/D cup-to-disc ratio; HVF humphrey visual field; GVF goldmann visual field; OCT optical coherence tomography; IOP intraocular pressure; BRVO Branch retinal vein occlusion; CRVO central retinal vein occlusion; CRAO central retinal artery occlusion; BRAO branch retinal artery occlusion; RT retinal tear; SB scleral buckle; PPV pars plana vitrectomy; VH Vitreous hemorrhage; PRP panretinal laser photocoagulation; IVK intravitreal kenalog; VMT vitreomacular traction; MH Macular hole;  NVD neovascularization of the disc; NVE neovascularization elsewhere; AREDS age related eye disease study; ARMD age related macular degeneration; POAG primary open angle glaucoma; EBMD epithelial/anterior basement membrane dystrophy; ACIOL anterior chamber intraocular lens; IOL intraocular lens; PCIOL posterior chamber intraocular lens; Phaco/IOL phacoemulsification with intraocular lens placement; [place] photorefractive keratectomy; LASIK laser assisted in situ keratomileusis; HTN hypertension; DM diabetes mellitus; COPD chronic obstructive pulmonary disease

## 2019-02-07 ENCOUNTER — Encounter (INDEPENDENT_AMBULATORY_CARE_PROVIDER_SITE_OTHER): Payer: Self-pay | Admitting: Ophthalmology

## 2019-02-07 ENCOUNTER — Ambulatory Visit (INDEPENDENT_AMBULATORY_CARE_PROVIDER_SITE_OTHER): Payer: Medicaid Other | Admitting: Ophthalmology

## 2019-02-07 ENCOUNTER — Other Ambulatory Visit: Payer: Self-pay

## 2019-02-07 ENCOUNTER — Other Ambulatory Visit (INDEPENDENT_AMBULATORY_CARE_PROVIDER_SITE_OTHER): Payer: Self-pay

## 2019-02-07 DIAGNOSIS — H35033 Hypertensive retinopathy, bilateral: Secondary | ICD-10-CM | POA: Diagnosis not present

## 2019-02-07 DIAGNOSIS — I10 Essential (primary) hypertension: Secondary | ICD-10-CM | POA: Diagnosis not present

## 2019-02-07 DIAGNOSIS — H3581 Retinal edema: Secondary | ICD-10-CM

## 2019-02-07 DIAGNOSIS — H2513 Age-related nuclear cataract, bilateral: Secondary | ICD-10-CM

## 2019-02-07 DIAGNOSIS — E113313 Type 2 diabetes mellitus with moderate nonproliferative diabetic retinopathy with macular edema, bilateral: Secondary | ICD-10-CM | POA: Diagnosis not present

## 2019-02-07 MED ORDER — DIPHENHYDRAMINE HCL 50 MG/ML IJ SOLN: 12.5000 mg | Freq: Once | INTRAMUSCULAR | Status: AC

## 2019-02-07 MED ORDER — BEVACIZUMAB CHEMO INJECTION 1.25MG/0.05ML SYRINGE FOR KALEIDOSCOPE
1.2500 mg | INTRAVITREAL | Status: AC | PRN
  Administered 2019-02-07: 17:00:00 1.25 mg via INTRAVITREAL

## 2019-02-12 NOTE — Progress Notes (Signed)
[facility] Note  02/14/2019     CHIEF COMPLAINT Patient presents for Retina Follow Up   HISTORY OF PRESENT ILLNESS: Ana Long is a 43 y.o. female who presents to the clinic today for:   HPI    Retina Follow Up    Patient presents with  Diabetic Retinopathy.  In both eyes.  I, the attending physician,  performed the HPI with the patient and updated documentation appropriately.          Comments    Pt is here for IVA OS, she states her vision is "alright", she states her eye was sore after the right eye injection, she denies new fol or floaters       Last edited by Bernarda Caffey, MD on 02/14/2019  1:54 PM. (History)    pt is here for IVA OS today  Referring physician: [name] MD [place], [place] [address]  HISTORICAL INFORMATION:   Selected notes from the [facility]-referred by Dr. [name] for DM exam LEE: 08.10.20 (S. [place]) [BCVA: OD: 20/25 OS: 20/25] Ocular Hx-cataracts OU, DES OU, allergic conjunctivitis OU, glaucoma suspect, chalazion PMH-DM (takes Januvia, Lantus), rheumatoid arthritis, hx of migraines, herpes, last a1c 11.2    CURRENT MEDICATIONS: No current outpatient medications on file. (Ophthalmic Drugs)   No current facility-administered medications for this visit. (Ophthalmic Drugs)   Current Outpatient Medications (Other)  Medication Sig  . acyclovir (ZOVIRAX) 400 MG tablet TAKE 1 TABLET BY MOUTH TWICE A DAY  . albuterol (PROAIR HFA) 108 (90 Base) MCG/ACT inhaler Inhale 2 puffs into the lungs every 4 (four) hours as needed for wheezing or shortness of breath (cough). (Patient taking differently: Inhale 2 puffs into the lungs every 4 (four) hours as needed for wheezing or shortness of breath (or coughing). )  . atorvastatin (LIPITOR) 40 MG tablet Take 1 tablet (40 mg total) by mouth at bedtime.  . benzonatate (TESSALON) 200 MG capsule Take 1 capsule (200 mg total) by mouth 3  (three) times daily as needed for cough. (Patient not taking: Reported on 01/25/2019)  . Blood Glucose Monitoring Suppl (ACCU-CHEK AVIVA PLUS) w/Device KIT Please check you blood sugars 4 times a day  . dapagliflozin propanediol (FARXIGA) 5 MG TABS tablet Take 5 mg by mouth daily. (Patient taking differently: Take 5 mg by mouth 2 (two) times a day. )  . diclofenac (VOLTAREN) 75 MG EC tablet Take 1 tablet (75 mg total) by mouth 2 (two) times daily. (Patient taking differently: Take 75 mg by mouth 2 (two) times daily as needed (for pain or inflammation). )  . fluconazole (DIFLUCAN) 150 MG tablet TAKE 1 TABLET BY MOUTH NOW. THEN REPEAT IN 1 WEEK  . furosemide (LASIX) 20 MG tablet Take 20 mg by mouth daily as needed for fluid or edema.   Marland Kitchen glucose blood (ACCU-CHEK SMARTVIEW) test strip TEST FOUR TIMES DAILY  . ibuprofen (ADVIL) 600 MG tablet Take 1 tablet (600 mg total) by mouth every 8 (eight) hours as needed. (Patient taking differently: Take 600 mg by mouth every 8 (eight) hours as needed for moderate pain. )  . insulin aspart (NOVOLOG) 100 UNIT/ML injection Inject 0-20 Units into the skin every 4 (four) hours. CBG 70 - 120: 0 units  CBG 121 - 150: 3 units  CBG 151 - 200: 4 units  CBG 201 - 250: 7 units  CBG 251 - 300: 11 units  CBG 301 -  350: 15 units  CBG 351 - 400: 20 units  CBG > 400 call MD (Patient taking differently: Inject 20 Units into the skin every 4 (four) hours. CBG 70 - 120: 0 units  CBG 121 - 150: 3 units  CBG 151 - 200: 4 units  CBG 201 - 250: 7 units  CBG 251 - 300: 11 units  CBG 301 - 350: 15 units  CBG 351 - 400: 20 units  CBG > 400 call MD)  . Insulin Glargine (LANTUS) 100 UNIT/ML Solostar Pen Inject 80 Units into the skin 2 (two) times daily.  . Insulin Pen Needle (B-D UF III MINI PEN NEEDLES) 31G X 5 MM MISC USE AS DIRECTED TO INJECT INSULIN FIVE A DAY E11.65  . JANUVIA 100 MG tablet TAKE 1 TABLET BY MOUTH EVERY DAY  . lactulose (CEPHULAC) 10 g packet Take 1 packet (10 g  total) by mouth daily as needed. Reported on 07/29/2015 (Patient taking differently: Take 10 g by mouth daily as needed (for constipation). )  . Lancets (ACCU-CHEK SOFT TOUCH) lancets Use as instructed  . liraglutide (VICTOZA) 18 MG/3ML SOPN Take 0.37m daily for 1 week, then 1.252mdaily for 1 week, then 1.44m63maily.  . lMarland Kitchenratadine (CLARITIN) 10 MG tablet Take 1 tablet (10 mg total) by mouth 2 (two) times daily. (Patient not taking: Reported on 01/25/2019)  . meloxicam (MOBIC) 15 MG tablet TAKE 1 TABLET BY MOUTH ONCE A DAY TAKE DAILY FOR TWO WEEKS, AND THEN ONLY AS NEEDED AFTERWARDS.  . mMarland Kitchenthocarbamol (ROBAXIN) 500 MG tablet Take 1 tablet (500 mg total) by mouth every 8 (eight) hours as needed. (Patient taking differently: Take 500 mg by mouth every 8 (eight) hours as needed for muscle spasms. )  . mometasone-formoterol (DULERA) 200-5 MCG/ACT AERO Inhale 2 puffs into the lungs 2 (two) times daily.  . montelukast (SINGULAIR) 10 MG tablet Take 1 tablet (10 mg total) by mouth at bedtime.  . nitroGLYCERIN (NITRODUR - DOSED IN MG/24 HR) 0.2 mg/hr patch Apply 1/4th patch to affected achilles, change daily  . omeprazole (PRILOSEC) 20 MG capsule Take 1 capsule (20 mg total) by mouth daily before breakfast.   Current Facility-Administered Medications (Other)  Medication Route  . diphenhydrAMINE (BENADRYL) injection 12.5 mg Intramuscular      REVIEW OF SYSTEMS: ROS    Positive for: Endocrine, Eyes   Negative for: Constitutional, Gastrointestinal, Neurological, Skin, Genitourinary, Musculoskeletal, HENT, Cardiovascular, Respiratory, Psychiatric, Allergic/Imm, Heme/Lymph   Last edited by BroDebbrah AlarOT on 02/14/2019  1:38 PM. (History)       ALLERGIES Allergies  Allergen Reactions  . Bee Venom Anaphylaxis and Swelling  . Wasp Venom Anaphylaxis and Swelling  . Citrus Hives and Itching  . Latex Hives and Swelling  . Penicillins Hives and Itching    Has patient had a PCN reaction causing  immediate rash, facial/tongue/throat swelling, SOB or lightheadedness with hypotension: Yes Has patient had a PCN reaction causing severe rash involving mucus membranes or skin necrosis: Unk Has patient had a PCN reaction that required hospitalization: Was already in hosp Has patient had a PCN reaction occurring within the last 10 years: No If all of the above answers are "NO", then may proceed with Cephalosporin use.   . Shellfish Allergy Other (See Comments) and Swelling    Patient is uncertain; stated that she tolerates this (??)  . Soap Hives, Itching and Swelling    PUREX LAUNDRY DETERGENT  . Tomato Hives and Itching  . Banana Itching  and Nausea Only  . Chocolate Itching  . Fluticasone Other (See Comments)    Per patient, makes sneeze and gag  . Hydrocodone Itching and Nausea Only  . Other Itching    Acidic foods- Itching [place]- Itching in the roof of the mouth  . Adhesive [Tape] Itching and Rash    Allergic to  "leads"  . Doxycycline Other (See Comments)    AFFECTS JOINTS  . Metformin And Related Nausea Only, Rash, Other (See Comments) and Diarrhea    GI UPSET, also  . Red Dye Itching, Rash and Other (See Comments)    At eye doctor's office, the dilating drops- Skin breaks out, chest turned red, itching, face became swollen    PAST MEDICAL HISTORY Past Medical History:  Diagnosis Date  . Acute nonintractable headache 12/26/2017  . Arthritis    major joints  . Asthma    prn inhaler  . Asthma exacerbation 07/25/2013  . Asthma in adult, severe persistent, with acute exacerbation 01/11/2018  . Carpal tunnel syndrome of right wrist 09/2012  . Complication of anesthesia    states is hard to wake up post-op  . Constipation 12/30/2014  . Diabetes mellitus    intolerant to Metformin  . Dry skin    hands   . Family history of anesthesia complication    mother went into coma during c-section  . Genital herpes 01/01/2006   on acyclovir BID   . GERD (gastroesophageal reflux  disease)   . Herpes genital   . Hyperglycemia 12/26/2017  . Low back pain 11/22/2012  . Lower extremity edema 10/18/2012  . Menorrhagia 11/06/2015  . Morbid obesity with BMI of 50 to 60 07/23/2009  . Obesity hypoventilation syndrome ([place]) 10/30/2012  . Obesity, morbid ([place]) 01/10/2014  . OSA on CPAP     AHI was on  11-17-12 to 120/hr, titrated to 15 cm with 3 cm EPR/   . PCOS (polycystic ovarian syndrome)   . Rheumatoid arthritis ([place])   . Right ankle pain 06/19/2015  . Right hip pain 09/04/2017  . Right shoulder pain 06/01/2017  . Seasonal allergies   . Sleep apnea, obstructive    uses CPAP nightly - had sleep study 08/22/2007   Past Surgical History:  Procedure Laterality Date  . CARPAL TUNNEL RELEASE Right 10/02/2012   Procedure: RIGHT CARPAL TUNNEL RELEASE ENDOSCOPIC;  Surgeon: Jolyn Nap, MD;  Location: [facility];  Service: Orthopedics;  Laterality: Right;  . CHOLECYSTECTOMY N/A 12/02/2014   Procedure: LAPAROSCOPIC CHOLECYSTECTOMY;  Surgeon: Armandina Gemma, MD;  Location: [facility];  Service: General;  Laterality: N/A;  . HYSTEROSCOPY WITH D & C  01-08-2002    FAMILY HISTORY Family History  Problem Relation Age of Onset  . Other Mother   . Leukemia Cousin   . Other Other   . Obesity Other   . Other Other   . Diabetes Maternal Aunt   . Heart attack Maternal Aunt   . Alzheimer's disease Maternal Uncle   . Cancer Maternal Uncle        prostate  . Diabetes Paternal Aunt   . Cancer Paternal Aunt        brain,mouth  . Alzheimer's disease Paternal Aunt   . Diabetes Paternal Uncle   . Diabetes Maternal Grandmother   . Diabetes Maternal Grandfather     SOCIAL HISTORY Social History   Tobacco Use  . Smoking status: Former Smoker    Years: 10.00    Quit date: 05/24/2010  Years since quitting: 8.7  . Smokeless tobacco: Never Used  Substance Use Topics  . Alcohol use: Yes    Alcohol/week: 0.0 standard drinks    Comment: socially  . Drug use: No          OPHTHALMIC EXAM:  Base Eye Exam    Visual Acuity (Snellen - Linear)      Right Left   Dist cc 20/40 +1 20/25 +2   Dist ph cc 20/20 -2 NI       Tonometry (Tonopen, 1:45 PM)      Right Left   Pressure 18 15       Pupils      Dark Light Shape React APD   Right 3 2 Round Brisk None   Left 3 2 Round Brisk None       Visual Fields (Counting fingers)      Left Right    Full Full       Extraocular Movement      Right Left    Full, Ortho Full, Ortho       Neuro/Psych    Oriented x3: Yes   Mood/Affect: Normal       Dilation    Both eyes: 1.0% Mydriacyl, 2.5% Phenylephrine @ 1:45 PM        Slit Lamp and Fundus Exam    Slit Lamp Exam      Right Left   Lids/Lashes Dermatochalasis - upper lid, mild Meibomian gland dysfunction Dermatochalasis - upper lid, mild Meibomian gland dysfunction   Conjunctiva/Sclera White and quiet White and quiet   Cornea Trace Punctate epithelial erosions Trace Punctate epithelial erosions   Anterior Chamber deep and quiet deep and quiet   Iris round and dilated, No NVI round and dilated   Lens 1+ NS, 1+CC 1+NS, 1+CC   Vitreous mild vitreous syneresis mild vitreous syneresis       Fundus Exam      Right Left   Disc Pink and sharp Pink and sharp   C/D Ratio 0.65 0.65   Macula good foveal reflex, scattered punctate exudates/MA/IRH, +cystic changes blunted foveal reflex, scatttered MA's and exudate, +cystic changes   Vessels Vascular attenuation, Tortuous, Copper wiring Vascular attenuation, Tortuous, Copper wiring   Periphery Attached, scattered MA and exudate greatest nasally Attached, scattered MA and exudate greatest nasally, Preretinal hemorrhage          IMAGING AND PROCEDURES  Imaging and Procedures for _0 @  OCT, Retina - OU - Both Eyes       Right Eye Quality was good. Central Foveal Thickness: 265. Progression has improved. Findings include normal foveal contour, no SRF, intraretinal fluid, intraretinal  hyper-reflective material (Interval improvement in IRF).   Left Eye Quality was good. Central Foveal Thickness: 284. Progression has worsened. Findings include intraretinal fluid, no SRF, abnormal foveal contour, intraretinal hyper-reflective material (Mild interval increase in IRF ).   Notes *Images captured and stored on drive  Diagnosis / Impression:  +DME OU-- persistent IRF/IRHM OD: Interval improvement in IRF OS: Mild interval increase in IRF   Clinical management:  See below  Abbreviations: NFP - Normal foveal profile. CME - cystoid macular edema. PED - pigment epithelial detachment. IRF - intraretinal fluid. SRF - subretinal fluid. EZ - ellipsoid zone. ERM - epiretinal membrane. ORA - outer retinal atrophy. ORT - outer retinal tubulation. SRHM - subretinal hyper-reflective material        Intravitreal Injection, Pharmacologic Agent - OS - Left Eye  Time Out 02/14/2019. 1:48 PM. Confirmed correct patient, procedure, site, and patient consented.   Anesthesia Topical anesthesia was used. Anesthetic medications included Lidocaine 2%, Proparacaine 0.5%.   Procedure Preparation included 5% betadine to ocular surface, eyelid speculum. A supplied needle was used.   Injection:  1.25 mg Bevacizumab (AVASTIN) SOLN   NDC: 81017-510-25, Lot: [id number] , Expiration date: 03/28/2019   Route: Intravitreal, Site: Left Eye, Waste: 0 mL  Post-op Post injection exam found visual acuity of at least counting fingers. The patient tolerated the procedure well. There were no complications. The patient received written and verbal post procedure care education.                 ASSESSMENT/PLAN:    ICD-10-CM   1. Moderate nonproliferative diabetic retinopathy of both eyes with macular edema associated with type 2 diabetes mellitus (HCC)  E52.7782 Intravitreal Injection, Pharmacologic Agent - OS - Left Eye    Bevacizumab (AVASTIN) SOLN 1.25 mg  2. Retinal edema  H35.81 OCT,  Retina - OU - Both Eyes  3. Essential hypertension  I10   4. Hypertensive retinopathy of both eyes  H35.033   5. Nuclear sclerosis of both eyes  H25.13     1,2. Moderate non-proliferative diabetic retinopathy w/ DME, OU  - pt lost to f/u from 10.23.2019 to 11.18.20 -- was supposed to return for focal laser for DME  - FA in 2019 showed leaking MA OU; no NV OU  **of note, pt had pruritic rxn to fluorescein dye -- resolved with 12.5 mg IV benadryl**  - FA 12.16.2020 shows late leaking MA's, non-perfusion defects; no NV OU  - exam shows scattered MA, IRH, exudates and mild macular edema OU  - s/p IVA OU #1 (12.16.20)  - OCT shows interval improvement in IRF OD, mild interval increase in IRF OS   - BCVA OD 20/20, OS 20/25 OS  - recommend IVA OS today  - RBA of procedure discussed, questions answered             - informed consent obtained and signed             - see procedure note  - recommend PRP laser OU in between IVA injections for treatment of capillary nonperfusion  - F/U in 4 weeks for DFE, OCT and IVA OU, consider PRP OU at a later date  3,4. Hypertensive retinopathy OU  - discussed importance of tight BP control  - monitor  5. Nuclear Sclerosis   - The symptoms of cataract, surgical options, and treatments and risks were discussed with patient.  - discussed diagnosis and progression  - under the expert management of [place] Ordered this visit:  Meds ordered this encounter  Medications  . Bevacizumab (AVASTIN) SOLN 1.25 mg       Return in about 4 weeks (around 03/14/2019) for f/u DME OU - Dilated Exam, OCT, Possible Injxn OU.  There are no Patient Instructions on file for this visit.   Explained the diagnoses, plan, and follow up with the patient and they expressed understanding.  Patient expressed understanding of the importance of proper follow up care.   This document serves as a record of services personally performed by Gardiner Sleeper,  MD, PhD. It was created on their behalf by Roselee Nova, COMT. The creation of this record is the provider's dictation and/or activities during the visit.  Electronically signed by: Roselee Nova, COMT 02/14/19 2:16 PM  Gardiner Sleeper, M.D.,  Ph.D. Diseases & Surgery of the Retina and [place] 02/14/2019   I have reviewed the above documentation for accuracy and completeness, and I agree with the above. Gardiner Sleeper, M.D., Ph.D. 02/14/19 2:16 PM   Abbreviations: M myopia (nearsighted); A astigmatism; H hyperopia (farsighted); P presbyopia; Mrx spectacle prescription;  CTL contact lenses; OD right eye; OS left eye; OU both eyes  XT exotropia; ET esotropia; PEK punctate epithelial keratitis; PEE punctate epithelial erosions; DES dry eye syndrome; MGD meibomian gland dysfunction; ATs artificial tears; PFAT's preservative free artificial tears; [place] nuclear sclerotic cataract; PSC posterior subcapsular cataract; ERM epi-retinal membrane; PVD posterior vitreous detachment; RD retinal detachment; DM diabetes mellitus; DR diabetic retinopathy; NPDR non-proliferative diabetic retinopathy; PDR proliferative diabetic retinopathy; CSME clinically significant macular edema; DME diabetic macular edema; dbh dot blot hemorrhages; CWS cotton wool spot; POAG primary open angle glaucoma; C/D cup-to-disc ratio; HVF humphrey visual field; GVF goldmann visual field; OCT optical coherence tomography; IOP intraocular pressure; BRVO Branch retinal vein occlusion; CRVO central retinal vein occlusion; CRAO central retinal artery occlusion; BRAO branch retinal artery occlusion; RT retinal tear; SB scleral buckle; PPV pars plana vitrectomy; VH Vitreous hemorrhage; PRP panretinal laser photocoagulation; IVK intravitreal kenalog; VMT vitreomacular traction; MH Macular hole;  NVD neovascularization of the disc; NVE neovascularization elsewhere; AREDS age related eye disease study; ARMD age related  macular degeneration; POAG primary open angle glaucoma; EBMD epithelial/anterior basement membrane dystrophy; ACIOL anterior chamber intraocular lens; IOL intraocular lens; PCIOL posterior chamber intraocular lens; Phaco/IOL phacoemulsification with intraocular lens placement; [place] photorefractive keratectomy; LASIK laser assisted in situ keratomileusis; HTN hypertension; DM diabetes mellitus; COPD chronic obstructive pulmonary disease

## 2019-02-14 ENCOUNTER — Encounter (INDEPENDENT_AMBULATORY_CARE_PROVIDER_SITE_OTHER): Payer: Self-pay | Admitting: Ophthalmology

## 2019-02-14 ENCOUNTER — Ambulatory Visit (INDEPENDENT_AMBULATORY_CARE_PROVIDER_SITE_OTHER): Payer: Medicaid Other | Admitting: Ophthalmology

## 2019-02-14 DIAGNOSIS — H3581 Retinal edema: Secondary | ICD-10-CM

## 2019-02-14 DIAGNOSIS — I10 Essential (primary) hypertension: Secondary | ICD-10-CM

## 2019-02-14 DIAGNOSIS — H35033 Hypertensive retinopathy, bilateral: Secondary | ICD-10-CM

## 2019-02-14 DIAGNOSIS — E113313 Type 2 diabetes mellitus with moderate nonproliferative diabetic retinopathy with macular edema, bilateral: Secondary | ICD-10-CM

## 2019-02-14 DIAGNOSIS — H2513 Age-related nuclear cataract, bilateral: Secondary | ICD-10-CM

## 2019-02-14 MED ORDER — BEVACIZUMAB CHEMO INJECTION 1.25MG/0.05ML SYRINGE FOR KALEIDOSCOPE
1.2500 mg | INTRAVITREAL | Status: AC | PRN
  Administered 2019-02-14: 1.25 mg via INTRAVITREAL

## 2019-03-14 ENCOUNTER — Encounter (INDEPENDENT_AMBULATORY_CARE_PROVIDER_SITE_OTHER): Payer: Medicaid Other | Admitting: Ophthalmology

## 2019-03-15 NOTE — Progress Notes (Signed)
[facility] Note  03/19/2019     CHIEF COMPLAINT Patient presents for Retina Follow Up   HISTORY OF PRESENT ILLNESS: Ana Long is a 44 y.o. female who presents to the clinic today for:   HPI    Retina Follow Up    Patient presents with  Diabetic Retinopathy.  In both eyes.  This started 3 months ago.  Severity is mild.  Since onset it is stable.  I, the attending physician,  performed the HPI with the patient and updated documentation appropriately.          Comments    F/U NPDR OU. Patient states as time goes, her vision seems to become blurry before next ov BS 172 this am. Pt started 70/30 Novolog on 03/16/2019.        Last edited by Bernarda Caffey, MD on 03/19/2019  3:06 PM. (History)    pt states as time goes by, her vision seems to become blurry right before appointment to come back for possible treatment.   Referring physician: [name] MD [place],  [id number]  HISTORICAL INFORMATION:   Selected notes from the [facility] Re-referred by Dr. [name] for DM exam LEE: 08.10.20 (S. Groat) [BCVA: OD: 20/25 OS: 20/25] Ocular Hx-cataracts OU, DES OU, allergic conjunctivitis OU, glaucoma suspect, chalazion PMH-DM (takes Januvia, Lantus), rheumatoid arthritis, hx of migraines, herpes, last a1c 11.2    CURRENT MEDICATIONS: No current outpatient medications on file. (Ophthalmic Drugs)   No current facility-administered medications for this visit. (Ophthalmic Drugs)   Current Outpatient Medications (Other)  Medication Sig  . acyclovir (ZOVIRAX) 400 MG tablet TAKE 1 TABLET BY MOUTH TWICE A DAY  . albuterol (PROAIR HFA) 108 (90 Base) MCG/ACT inhaler Inhale 2 puffs into the lungs every 4 (four) hours as needed for wheezing or shortness of breath (cough). (Patient taking differently: Inhale 2 puffs into the lungs every 4 (four) hours as needed for wheezing or shortness of breath (or coughing). )  .  atorvastatin (LIPITOR) 40 MG tablet Take 1 tablet (40 mg total) by mouth at bedtime.  . benzonatate (TESSALON) 200 MG capsule Take 1 capsule (200 mg total) by mouth 3 (three) times daily as needed for cough.  . Blood Glucose Monitoring Suppl (ACCU-CHEK AVIVA PLUS) w/Device KIT Please check you blood sugars 4 times a day  . dapagliflozin propanediol (FARXIGA) 5 MG TABS tablet Take 5 mg by mouth daily. (Patient taking differently: Take 5 mg by mouth 2 (two) times a day. )  . diclofenac (VOLTAREN) 75 MG EC tablet Take 1 tablet (75 mg total) by mouth 2 (two) times daily. (Patient taking differently: Take 75 mg by mouth 2 (two) times daily as needed (for pain or inflammation). )  . fluconazole (DIFLUCAN) 150 MG tablet TAKE 1 TABLET BY MOUTH NOW. THEN REPEAT IN 1 WEEK  . furosemide (LASIX) 20 MG tablet Take 20 mg by mouth daily as needed for fluid or edema.   Marland Kitchen glucose blood (ACCU-CHEK SMARTVIEW) test strip TEST FOUR TIMES DAILY  . ibuprofen (ADVIL) 600 MG tablet Take 1 tablet (600 mg total) by mouth every 8 (eight) hours as needed. (Patient taking differently: Take 600 mg by mouth every 8 (eight) hours as needed for moderate pain. )  . insulin aspart (NOVOLOG) 100 UNIT/ML injection Inject 0-20 Units into the skin every 4 (four) hours. CBG 70 - 120: 0 units  CBG 121 - 150: 3 units  CBG 151 - 200: 4 units  CBG 201 - 250: 7 units  CBG 251 - 300: 11 units  CBG 301 - 350: 15 units  CBG 351 - 400: 20 units  CBG > 400 call MD (Patient taking differently: Inject 20 Units into the skin every 4 (four) hours. CBG 70 - 120: 0 units  CBG 121 - 150: 3 units  CBG 151 - 200: 4 units  CBG 201 - 250: 7 units  CBG 251 - 300: 11 units  CBG 301 - 350: 15 units  CBG 351 - 400: 20 units  CBG > 400 call MD)  . insulin aspart protamine - aspart (NOVOLOG MIX 70/30 FLEXPEN) (70-30) 100 UNIT/ML FlexPen Inject into the skin.  . Insulin Pen Needle (B-D UF III MINI PEN NEEDLES) 31G X 5 MM MISC USE AS DIRECTED TO INJECT INSULIN  FIVE A DAY E11.65  . JANUVIA 100 MG tablet TAKE 1 TABLET BY MOUTH EVERY DAY  . lactulose (CEPHULAC) 10 g packet Take 1 packet (10 g total) by mouth daily as needed. Reported on 07/29/2015 (Patient taking differently: Take 10 g by mouth daily as needed (for constipation). )  . Lancets (ACCU-CHEK SOFT TOUCH) lancets Use as instructed  . liraglutide (VICTOZA) 18 MG/3ML SOPN Take 0.2m daily for 1 week, then 1.2124mdaily for 1 week, then 1.24m60maily.  . lMarland Kitchenratadine (CLARITIN) 10 MG tablet Take 1 tablet (10 mg total) by mouth 2 (two) times daily.  . meloxicam (MOBIC) 15 MG tablet TAKE 1 TABLET BY MOUTH ONCE A DAY TAKE DAILY FOR TWO WEEKS, AND THEN ONLY AS NEEDED AFTERWARDS.  . mMarland Kitchenthocarbamol (ROBAXIN) 500 MG tablet Take 1 tablet (500 mg total) by mouth every 8 (eight) hours as needed. (Patient taking differently: Take 500 mg by mouth every 8 (eight) hours as needed for muscle spasms. )  . mometasone-formoterol (DULERA) 200-5 MCG/ACT AERO Inhale 2 puffs into the lungs 2 (two) times daily.  . montelukast (SINGULAIR) 10 MG tablet Take 1 tablet (10 mg total) by mouth at bedtime.  . nitroGLYCERIN (NITRODUR - DOSED IN MG/24 HR) 0.2 mg/hr patch Apply 1/4th patch to affected achilles, change daily  . omeprazole (PRILOSEC) 20 MG capsule Take 1 capsule (20 mg total) by mouth daily before breakfast.  . Insulin Glargine (LANTUS) 100 UNIT/ML Solostar Pen Inject 80 Units into the skin 2 (two) times daily.   Current Facility-Administered Medications (Other)  Medication Route  . diphenhydrAMINE (BENADRYL) injection 12.5 mg Intramuscular      REVIEW OF SYSTEMS: ROS    Positive for: Endocrine, Eyes   Negative for: Constitutional, Gastrointestinal, Neurological, Skin, Genitourinary, Musculoskeletal, HENT, Cardiovascular, Respiratory, Psychiatric, Allergic/Imm, Heme/Lymph   Last edited by KenZenovia JordanPN on 03/19/55/3220:12:54. (History)       ALLERGIES Allergies  Allergen Reactions  . Bee Venom Anaphylaxis  and Swelling  . Wasp Venom Anaphylaxis and Swelling  . Citrus Hives and Itching  . Latex Hives and Swelling  . Penicillins Hives and Itching    Has patient had a PCN reaction causing immediate rash, facial/tongue/throat swelling, SOB or lightheadedness with hypotension: Yes Has patient had a PCN reaction causing severe rash involving mucus membranes or skin necrosis: Unk Has patient had a PCN reaction that required hospitalization: Was already in hosp Has patient had a PCN reaction occurring within the last 10 years: No If all of the above answers are "NO", then may proceed with Cephalosporin use.   . Shellfish Allergy Other (See Comments) and  Swelling    Patient is uncertain; stated that she tolerates this (??)  . Soap Hives, Itching and Swelling    PUREX LAUNDRY DETERGENT  . Tomato Hives and Itching  . Banana Itching and Nausea Only  . Chocolate Itching  . Fluticasone Other (See Comments)    Per patient, makes sneeze and gag  . Hydrocodone Itching and Nausea Only  . Other Itching    Acidic foods- Itching [place]- Itching in the roof of the mouth  . Adhesive [Tape] Itching and Rash    Allergic to  "leads"  . Doxycycline Other (See Comments)    AFFECTS JOINTS  . Metformin And Related Nausea Only, Rash, Other (See Comments) and Diarrhea    GI UPSET, also  . Red Dye Itching, Rash and Other (See Comments)    At eye doctor's office, the dilating drops- Skin breaks out, chest turned red, itching, face became swollen    PAST MEDICAL HISTORY Past Medical History:  Diagnosis Date  . Acute nonintractable headache 12/26/2017  . Arthritis    major joints  . Asthma    prn inhaler  . Asthma exacerbation 07/25/2013  . Asthma in adult, severe persistent, with acute exacerbation 01/11/2018  . Carpal tunnel syndrome of right wrist 09/2012  . Complication of anesthesia    states is hard to wake up post-op  . Constipation 12/30/2014  . Diabetes mellitus    intolerant to Metformin  . Dry  skin    hands   . Family history of anesthesia complication    mother went into coma during c-section  . Genital herpes 01/01/2006   on acyclovir BID   . GERD (gastroesophageal reflux disease)   . Herpes genital   . Hyperglycemia 12/26/2017  . Low back pain 11/22/2012  . Lower extremity edema 10/18/2012  . Menorrhagia 11/06/2015  . Morbid obesity with BMI of 50 to 60 07/23/2009  . Obesity hypoventilation syndrome ([place]) 10/30/2012  . Obesity, morbid ([place]) 01/10/2014  . OSA on CPAP     AHI was on  11-17-12 to 120/hr, titrated to 15 cm with 3 cm EPR/   . PCOS (polycystic ovarian syndrome)   . Rheumatoid arthritis ([place])   . Right ankle pain 06/19/2015  . Right hip pain 09/04/2017  . Right shoulder pain 06/01/2017  . Seasonal allergies   . Sleep apnea, obstructive    uses CPAP nightly - had sleep study 08/22/2007   Past Surgical History:  Procedure Laterality Date  . CARPAL TUNNEL RELEASE Right 10/02/2012   Procedure: RIGHT CARPAL TUNNEL RELEASE ENDOSCOPIC;  Surgeon: Jolyn Nap, MD;  Location: [facility];  Service: Orthopedics;  Laterality: Right;  . CHOLECYSTECTOMY N/A 12/02/2014   Procedure: LAPAROSCOPIC CHOLECYSTECTOMY;  Surgeon: Armandina Gemma, MD;  Location: [facility];  Service: General;  Laterality: N/A;  . HYSTEROSCOPY WITH D & C  01-08-2002    FAMILY HISTORY Family History  Problem Relation Age of Onset  . Other Mother   . Leukemia Cousin   . Other Other   . Obesity Other   . Other Other   . Diabetes Maternal Aunt   . Heart attack Maternal Aunt   . Alzheimer's disease Maternal Uncle   . Cancer Maternal Uncle        prostate  . Diabetes Paternal Aunt   . Cancer Paternal Aunt        brain,mouth  . Alzheimer's disease Paternal Aunt   . Diabetes Paternal Uncle   . Diabetes Maternal Grandmother   .  Diabetes Maternal Grandfather     SOCIAL HISTORY Social History   Tobacco Use  . Smoking status: Former Smoker    Years: 10.00    Quit date: 05/24/2010    Years  since quitting: 8.8  . Smokeless tobacco: Never Used  Substance Use Topics  . Alcohol use: Yes    Alcohol/week: 0.0 standard drinks    Comment: socially  . Drug use: No         OPHTHALMIC EXAM:  Base Eye Exam    Visual Acuity (Snellen - Linear)      Right Left   Dist cc 20/40 +1 20/30 -2   Dist ph cc 20/25 +2 20/30   Correction: Glasses       Tonometry (Tonopen, 2:26 PM)      Right Left   Pressure 10 12       Pupils      Dark Light Shape React APD   Right 3 2 Round Brisk None   Left 3 2 Round Brisk None       Visual Fields (Counting fingers)      Left Right    Full Full       Extraocular Movement      Right Left    Full, Ortho Full, Ortho       Neuro/Psych    Oriented x3: Yes   Mood/Affect: Normal       Dilation    Both eyes: 1.0% Mydriacyl, 2.5% Phenylephrine @ 2:26 PM        Slit Lamp and Fundus Exam    Slit Lamp Exam      Right Left   Lids/Lashes Dermatochalasis - upper lid, mild Meibomian gland dysfunction Dermatochalasis - upper lid, mild Meibomian gland dysfunction   Conjunctiva/Sclera White and quiet White and quiet   Cornea Trace Punctate epithelial erosions Trace Punctate epithelial erosions   Anterior Chamber deep and quiet deep and quiet   Iris round and dilated, No NVI round and dilated   Lens 1+ NS, 1+CC 1+NS, 1+CC   Vitreous mild vitreous syneresis mild vitreous syneresis       Fundus Exam      Right Left   Disc Pink and sharp Pink and sharp   C/D Ratio 0.65 0.65   Macula good foveal reflex, scattered punctate exudates/MA/IRH, +cystic changes--improved blunted foveal reflex, scatttered MA's and exudate, +cystic changes   Vessels Vascular attenuation, Tortuous, Copper wiring Vascular attenuation, Tortuous, Copper wiring   Periphery Attached, scattered MA and exudate greatest nasally--improving Attached, scattered MA and exudate greatest nasally, white without pressure temporally          IMAGING AND PROCEDURES  Imaging and  Procedures for @TODAY @  OCT, Retina - OU - Both Eyes       Right Eye Quality was good. Central Foveal Thickness: 227. Progression has improved. Findings include normal foveal contour, no SRF, intraretinal fluid, intraretinal hyper-reflective material (Interval improvement in IRF/IRHM).   Left Eye Quality was good. Central Foveal Thickness: 260. Progression has improved. Findings include intraretinal fluid, no SRF, abnormal foveal contour, intraretinal hyper-reflective material (interval improvement in central IRF, interval increase in non-central IRF ).   Notes *Images captured and stored on drive  Diagnosis / Impression:  +DME OU-- persistent IRF/IRHM OD: Interval improvement in IRF/IRHM OS:  interval improvement in central IRF, interval increase in non-central IRF   Clinical management:  See below  Abbreviations: NFP - Normal foveal profile. CME - cystoid macular edema. PED - pigment epithelial detachment. IRF -  intraretinal fluid. SRF - subretinal fluid. EZ - ellipsoid zone. ERM - epiretinal membrane. ORA - outer retinal atrophy. ORT - outer retinal tubulation. SRHM - subretinal hyper-reflective material        Intravitreal Injection, Pharmacologic Agent - OD - Right Eye       Time Out 03/19/2019. 2:34 PM. Confirmed correct patient, procedure, site, and patient consented.   Anesthesia Topical anesthesia was used. Anesthetic medications included Lidocaine 2%, Proparacaine 0.5%.   Procedure Preparation included 5% betadine to ocular surface, eyelid speculum. A 30 gauge needle was used.   Injection:  1.25 mg Bevacizumab (AVASTIN) SOLN   NDC: 16109-604-54, Lot: [id number] , Expiration date: 05/24/2019   Route: Intravitreal, Site: Right Eye, Waste: 0 mL  Post-op Post injection exam found visual acuity of at least counting fingers. The patient tolerated the procedure well. There were no complications. The patient received written and verbal post procedure care education.                  ASSESSMENT/PLAN:    ICD-10-CM   1. Moderate nonproliferative diabetic retinopathy of both eyes with macular edema associated with type 2 diabetes mellitus (HCC)  17/02/2019 Intravitreal Injection, Pharmacologic Agent - OD - Right Eye    Bevacizumab (AVASTIN) SOLN 1.25 mg  2. Retinal edema  H35.81 OCT, Retina - OU - Both Eyes  3. Essential hypertension  I10   4. Hypertensive retinopathy of both eyes  H35.033   5. Nuclear sclerosis of both eyes  H25.13     1,2. Moderate non-proliferative diabetic retinopathy w/ DME, OU  - pt lost to f/u from 10.23.2019 to 11.18.20 -- was supposed to return for focal laser for DME  - FA in 2019 showed leaking MA OU; no NV OU  **of note, pt had pruritic rxn to fluorescein dye -- resolved with 12.5 mg IV benadryl**  - FA 12.16.2020 shows late leaking MA's, non-perfusion defects; no NV OU  - exam shows scattered MA, IRH, exudates and mild macular edema OU  - s/p IVA OD #1 (12.16.20),  - s/p IVA OS #1 OS (12.23.20)  - OCT shows interval improvement in IRF/IRHM OD, interval improvement in central IRF and interval increase in non-central IRF OS   - BCVA OD 20/25+2 down from 20/20-2 on 12.23.20, OS 20/30, down from 20/25+2 on 12.23.20  - recommend IVA OU today, 01.25.21, but pt reports she is unable to do bilateral injections due to child care coverage, post injection  - will do IVA OD #2 today (01.25.21) and come back on 01.28.21 for injection OS  - RBA of procedure discussed, questions answered             - informed consent obtained and signed             - see procedure note  - recommend PRP laser OU in between IVA injections for treatment of capillary nonperfusion  - F/U 01.28.21 for injection OS  3,4. Hypertensive retinopathy OU  - discussed importance of tight BP control  - monitor  5. Nuclear Sclerosis   - The symptoms of cataract, surgical options, and treatments and risks were discussed with patient.  - discussed diagnosis and  progression  - under the expert management of [place] Ordered this visit:  Meds ordered this encounter  Medications  . Bevacizumab (AVASTIN) SOLN 1.25 mg       Return in about 3 days (around 03/22/2019) for injeciton OS.  There are no Patient Instructions  on file for this visit.   Explained the diagnoses, plan, and follow up with the patient and they expressed understanding.  Patient expressed understanding of the importance of proper follow up care.   This document serves as a record of services personally performed by Gardiner Sleeper, MD, PhD. It was created on their behalf by Roselee Nova, COMT. The creation of this record is the provider's dictation and/or activities during the visit.  Electronically signed by: Roselee Nova, COMT 03/19/19 3:37 PM  Gardiner Sleeper, M.D., Ph.D. Diseases & Surgery of the Retina and [place] 03/19/2019   I have reviewed the above documentation for accuracy and completeness, and I agree with the above. Gardiner Sleeper, M.D., Ph.D. 03/19/19 3:37 PM   Abbreviations: M myopia (nearsighted); A astigmatism; H hyperopia (farsighted); P presbyopia; Mrx spectacle prescription;  CTL contact lenses; OD right eye; OS left eye; OU both eyes  XT exotropia; ET esotropia; PEK punctate epithelial keratitis; PEE punctate epithelial erosions; DES dry eye syndrome; MGD meibomian gland dysfunction; ATs artificial tears; PFAT's preservative free artificial tears; [place] nuclear sclerotic cataract; PSC posterior subcapsular cataract; ERM epi-retinal membrane; PVD posterior vitreous detachment; RD retinal detachment; DM diabetes mellitus; DR diabetic retinopathy; NPDR non-proliferative diabetic retinopathy; PDR proliferative diabetic retinopathy; CSME clinically significant macular edema; DME diabetic macular edema; dbh dot blot hemorrhages; CWS cotton wool spot; POAG primary open angle glaucoma; C/D cup-to-disc ratio;  HVF humphrey visual field; GVF goldmann visual field; OCT optical coherence tomography; IOP intraocular pressure; BRVO Branch retinal vein occlusion; CRVO central retinal vein occlusion; CRAO central retinal artery occlusion; BRAO branch retinal artery occlusion; RT retinal tear; SB scleral buckle; PPV pars plana vitrectomy; VH Vitreous hemorrhage; PRP panretinal laser photocoagulation; IVK intravitreal kenalog; VMT vitreomacular traction; MH Macular hole;  NVD neovascularization of the disc; NVE neovascularization elsewhere; AREDS age related eye disease study; ARMD age related macular degeneration; POAG primary open angle glaucoma; EBMD epithelial/anterior basement membrane dystrophy; ACIOL anterior chamber intraocular lens; IOL intraocular lens; PCIOL posterior chamber intraocular lens; Phaco/IOL phacoemulsification with intraocular lens placement; [place] photorefractive keratectomy; LASIK laser assisted in situ keratomileusis; HTN hypertension; DM diabetes mellitus; COPD chronic obstructive pulmonary disease

## 2019-03-19 ENCOUNTER — Ambulatory Visit (INDEPENDENT_AMBULATORY_CARE_PROVIDER_SITE_OTHER): Payer: Medicaid Other | Admitting: Ophthalmology

## 2019-03-19 ENCOUNTER — Encounter (INDEPENDENT_AMBULATORY_CARE_PROVIDER_SITE_OTHER): Payer: Self-pay | Admitting: Ophthalmology

## 2019-03-19 DIAGNOSIS — I10 Essential (primary) hypertension: Secondary | ICD-10-CM | POA: Diagnosis not present

## 2019-03-19 DIAGNOSIS — E113313 Type 2 diabetes mellitus with moderate nonproliferative diabetic retinopathy with macular edema, bilateral: Secondary | ICD-10-CM

## 2019-03-19 DIAGNOSIS — H35033 Hypertensive retinopathy, bilateral: Secondary | ICD-10-CM | POA: Diagnosis not present

## 2019-03-19 DIAGNOSIS — H3581 Retinal edema: Secondary | ICD-10-CM | POA: Diagnosis not present

## 2019-03-19 DIAGNOSIS — H2513 Age-related nuclear cataract, bilateral: Secondary | ICD-10-CM

## 2019-03-19 MED ORDER — BEVACIZUMAB CHEMO INJECTION 1.25MG/0.05ML SYRINGE FOR KALEIDOSCOPE
1.2500 mg | INTRAVITREAL | Status: AC | PRN
  Administered 2019-03-19: 1.25 mg via INTRAVITREAL

## 2019-03-21 ENCOUNTER — Ambulatory Visit (INDEPENDENT_AMBULATORY_CARE_PROVIDER_SITE_OTHER): Payer: Medicaid Other | Admitting: Ophthalmology

## 2019-03-21 ENCOUNTER — Other Ambulatory Visit: Payer: Self-pay

## 2019-03-21 ENCOUNTER — Encounter (INDEPENDENT_AMBULATORY_CARE_PROVIDER_SITE_OTHER): Payer: Self-pay | Admitting: Ophthalmology

## 2019-03-21 DIAGNOSIS — E113313 Type 2 diabetes mellitus with moderate nonproliferative diabetic retinopathy with macular edema, bilateral: Secondary | ICD-10-CM | POA: Diagnosis not present

## 2019-03-21 DIAGNOSIS — H3581 Retinal edema: Secondary | ICD-10-CM

## 2019-03-21 DIAGNOSIS — H2513 Age-related nuclear cataract, bilateral: Secondary | ICD-10-CM

## 2019-03-21 DIAGNOSIS — H35033 Hypertensive retinopathy, bilateral: Secondary | ICD-10-CM

## 2019-03-21 DIAGNOSIS — I10 Essential (primary) hypertension: Secondary | ICD-10-CM

## 2019-03-21 NOTE — Progress Notes (Signed)
[facility] Note  03/21/2019     CHIEF COMPLAINT Patient presents for Retina Follow Up   HISTORY OF PRESENT ILLNESS: Ana Long is a 44 y.o. female who presents to the clinic today for:   HPI    Retina Follow Up    Patient presents with  Diabetic Retinopathy.  In both eyes.  This started months ago.  Severity is moderate.  Duration of 2 days.  Since onset it is stable.  I, the attending physician,  performed the HPI with the patient and updated documentation appropriately.          Comments    44 y/o female pt here for 2 day f/u for mod NPDR w/mac edema OU.  S/p IVA OD # 2 2 days ago on 1.25.21.  Here for IVA OS #2 today.  No change in [place] OU.  Denies pain, flashes.  Saw a small floater OD yesterday.  No gtts.       Last edited by Bernarda Caffey, MD on 03/23/2019  1:44 AM. (History)    pt is here for Avastin injection OS today  Referring physician: [name] MD [place], [place] [address]  HISTORICAL INFORMATION:   Selected notes from the [facility]-referred by Dr. [name] for DM exam    CURRENT MEDICATIONS: No current outpatient medications on file. (Ophthalmic Drugs)   No current facility-administered medications for this visit. (Ophthalmic Drugs)   Current Outpatient Medications (Other)  Medication Sig  . acyclovir (ZOVIRAX) 400 MG tablet TAKE 1 TABLET BY MOUTH TWICE A DAY  . albuterol (PROAIR HFA) 108 (90 Base) MCG/ACT inhaler Inhale 2 puffs into the lungs every 4 (four) hours as needed for wheezing or shortness of breath (cough). (Patient taking differently: Inhale 2 puffs into the lungs every 4 (four) hours as needed for wheezing or shortness of breath (or coughing). )  . atorvastatin (LIPITOR) 40 MG tablet Take 1 tablet (40 mg total) by mouth at bedtime.  . benzonatate (TESSALON) 200 MG capsule Take 1 capsule (200 mg total) by mouth 3 (three) times daily as needed for cough.  . Blood Glucose  Monitoring Suppl (ACCU-CHEK AVIVA PLUS) w/Device KIT Please check you blood sugars 4 times a day  . dapagliflozin propanediol (FARXIGA) 5 MG TABS tablet Take 5 mg by mouth daily. (Patient taking differently: Take 5 mg by mouth 2 (two) times a day. )  . diclofenac (VOLTAREN) 75 MG EC tablet Take 1 tablet (75 mg total) by mouth 2 (two) times daily. (Patient taking differently: Take 75 mg by mouth 2 (two) times daily as needed (for pain or inflammation). )  . fluconazole (DIFLUCAN) 150 MG tablet TAKE 1 TABLET BY MOUTH NOW. THEN REPEAT IN 1 WEEK  . furosemide (LASIX) 20 MG tablet Take 20 mg by mouth daily as needed for fluid or edema.   Marland Kitchen glucose blood (ACCU-CHEK SMARTVIEW) test strip TEST FOUR TIMES DAILY  . hydrochlorothiazide (HYDRODIURIL) 12.5 MG tablet Take by mouth.  Marland Kitchen ibuprofen (ADVIL) 600 MG tablet Take 1 tablet (600 mg total) by mouth every 8 (eight) hours as needed. (Patient taking differently: Take 600 mg by mouth every 8 (eight) hours as needed for moderate pain. )  . insulin aspart (NOVOLOG) 100 UNIT/ML injection Inject 0-20 Units into the skin every 4 (four) hours. CBG 70 - 120: 0 units  CBG 121 - 150: 3 units  CBG 151 - 200: 4 units  CBG  201 - 250: 7 units  CBG 251 - 300: 11 units  CBG 301 - 350: 15 units  CBG 351 - 400: 20 units  CBG > 400 call MD (Patient taking differently: Inject 20 Units into the skin every 4 (four) hours. CBG 70 - 120: 0 units  CBG 121 - 150: 3 units  CBG 151 - 200: 4 units  CBG 201 - 250: 7 units  CBG 251 - 300: 11 units  CBG 301 - 350: 15 units  CBG 351 - 400: 20 units  CBG > 400 call MD)  . insulin aspart protamine - aspart (NOVOLOG MIX 70/30 FLEXPEN) (70-30) 100 UNIT/ML FlexPen Inject into the skin.  . Insulin Pen Needle (B-D UF III MINI PEN NEEDLES) 31G X 5 MM MISC USE AS DIRECTED TO INJECT INSULIN FIVE A DAY E11.65  . JANUVIA 100 MG tablet TAKE 1 TABLET BY MOUTH EVERY DAY  . lactulose (CEPHULAC) 10 g packet Take 1 packet (10 g total) by mouth daily as  needed. Reported on 07/29/2015 (Patient taking differently: Take 10 g by mouth daily as needed (for constipation). )  . Lancets (ACCU-CHEK SOFT TOUCH) lancets Use as instructed  . liraglutide (VICTOZA) 18 MG/3ML SOPN Take 0.62m daily for 1 week, then 1.264mdaily for 1 week, then 1.77m49maily.  . lMarland Kitchenratadine (CLARITIN) 10 MG tablet Take 1 tablet (10 mg total) by mouth 2 (two) times daily.  . meloxicam (MOBIC) 15 MG tablet TAKE 1 TABLET BY MOUTH ONCE A DAY TAKE DAILY FOR TWO WEEKS, AND THEN ONLY AS NEEDED AFTERWARDS.  . mMarland Kitchenthocarbamol (ROBAXIN) 500 MG tablet Take 1 tablet (500 mg total) by mouth every 8 (eight) hours as needed. (Patient taking differently: Take 500 mg by mouth every 8 (eight) hours as needed for muscle spasms. )  . mometasone-formoterol (DULERA) 200-5 MCG/ACT AERO Inhale 2 puffs into the lungs 2 (two) times daily.  . montelukast (SINGULAIR) 10 MG tablet Take 1 tablet (10 mg total) by mouth at bedtime.  . nitroGLYCERIN (NITRODUR - DOSED IN MG/24 HR) 0.2 mg/hr patch Apply 1/4th patch to affected achilles, change daily  . omeprazole (PRILOSEC) 20 MG capsule Take 1 capsule (20 mg total) by mouth daily before breakfast.  . Insulin Glargine (LANTUS) 100 UNIT/ML Solostar Pen Inject 80 Units into the skin 2 (two) times daily.   Current Facility-Administered Medications (Other)  Medication Route  . diphenhydrAMINE (BENADRYL) injection 12.5 mg Intramuscular      REVIEW OF SYSTEMS: ROS    Positive for: Gastrointestinal, Musculoskeletal, Endocrine, Eyes, Respiratory   Negative for: Constitutional, Neurological, Skin, Genitourinary, HENT, Cardiovascular, Psychiatric, Allergic/Imm, Heme/Lymph   Last edited by BaxMatthew FolksOA on 03/21/2019  3:58 PM. (History)       ALLERGIES Allergies  Allergen Reactions  . Bee Venom Anaphylaxis and Swelling  . Wasp Venom Anaphylaxis and Swelling  . Citrus Hives and Itching  . Latex Hives and Swelling  . Penicillins Hives and Itching    Has  patient had a PCN reaction causing immediate rash, facial/tongue/throat swelling, SOB or lightheadedness with hypotension: Yes Has patient had a PCN reaction causing severe rash involving mucus membranes or skin necrosis: Unk Has patient had a PCN reaction that required hospitalization: Was already in hosp Has patient had a PCN reaction occurring within the last 10 years: No If all of the above answers are "NO", then may proceed with Cephalosporin use.   . Shellfish Allergy Other (See Comments) and Swelling    Patient is uncertain;  stated that she tolerates this (??)  . Soap Hives, Itching and Swelling    PUREX LAUNDRY DETERGENT  . Tomato Hives and Itching  . Banana Itching and Nausea Only  . Chocolate Itching  . Fluticasone Other (See Comments)    Per patient, makes sneeze and gag  . Hydrocodone Itching and Nausea Only  . Other Itching    Acidic foods- Itching [place]- Itching in the roof of the mouth  . Adhesive [Tape] Itching and Rash    Allergic to  "leads"  . Doxycycline Other (See Comments)    AFFECTS JOINTS  . Metformin And Related Nausea Only, Rash, Other (See Comments) and Diarrhea    GI UPSET, also  . Red Dye Itching, Rash and Other (See Comments)    At eye doctor's office, the dilating drops- Skin breaks out, chest turned red, itching, face became swollen    PAST MEDICAL HISTORY Past Medical History:  Diagnosis Date  . Acute nonintractable headache 12/26/2017  . Arthritis    major joints  . Asthma    prn inhaler  . Asthma exacerbation 07/25/2013  . Asthma in adult, severe persistent, with acute exacerbation 01/11/2018  . Carpal tunnel syndrome of right wrist 09/2012  . Cataract    NS OU  . Complication of anesthesia    states is hard to wake up post-op  . Constipation 12/30/2014  . Diabetes mellitus    intolerant to Metformin  . Diabetic retinopathy ([place])    NPDR OU  . Dry skin    hands   . Family history of anesthesia complication    mother went into coma  during c-section  . Genital herpes 01/01/2006   on acyclovir BID   . GERD (gastroesophageal reflux disease)   . Herpes genital   . Hyperglycemia 12/26/2017  . Hypertensive retinopathy    OU  . Low back pain 11/22/2012  . Lower extremity edema 10/18/2012  . Menorrhagia 11/06/2015  . Morbid obesity with BMI of 50 to 60 07/23/2009  . Obesity hypoventilation syndrome ([place]) 10/30/2012  . Obesity, morbid ([place]) 01/10/2014  . OSA on CPAP     AHI was on  11-17-12 to 120/hr, titrated to 15 cm with 3 cm EPR/   . PCOS (polycystic ovarian syndrome)   . Rheumatoid arthritis ([place])   . Right ankle pain 06/19/2015  . Right hip pain 09/04/2017  . Right shoulder pain 06/01/2017  . Seasonal allergies   . Sleep apnea, obstructive    uses CPAP nightly - had sleep study 08/22/2007   Past Surgical History:  Procedure Laterality Date  . CARPAL TUNNEL RELEASE Right 10/02/2012   Procedure: RIGHT CARPAL TUNNEL RELEASE ENDOSCOPIC;  Surgeon: Jolyn Nap, MD;  Location: [facility];  Service: Orthopedics;  Laterality: Right;  . CHOLECYSTECTOMY N/A 12/02/2014   Procedure: LAPAROSCOPIC CHOLECYSTECTOMY;  Surgeon: Armandina Gemma, MD;  Location: [facility];  Service: General;  Laterality: N/A;  . HYSTEROSCOPY WITH D & C  01-08-2002    FAMILY HISTORY Family History  Problem Relation Age of Onset  . Other Mother   . Leukemia Cousin   . Other Other   . Obesity Other   . Other Other   . Diabetes Maternal Aunt   . Heart attack Maternal Aunt   . Alzheimer's disease Maternal Uncle   . Cancer Maternal Uncle        prostate  . Diabetes Paternal Aunt   . Cancer Paternal Aunt        brain,mouth  .  Alzheimer's disease Paternal Aunt   . Diabetes Paternal Uncle   . Diabetes Maternal Grandmother   . Diabetes Maternal Grandfather     SOCIAL HISTORY Social History   Tobacco Use  . Smoking status: Former Smoker    Years: 10.00    Quit date: 05/24/2010    Years since quitting: 8.8  . Smokeless tobacco: Never  Used  Substance Use Topics  . Alcohol use: Yes    Alcohol/week: 0.0 standard drinks    Comment: socially  . Drug use: No         OPHTHALMIC EXAM:  Base Eye Exam    Visual Acuity (Snellen - Linear)      Right Left   Dist cc 20/30 -2 20/30 -2   Dist ph cc 20/25 +2 20/30 +   Correction: Glasses       Tonometry (Tonopen, 4:01 PM)      Right Left   Pressure 10 11       Pupils      Dark Light Shape React APD   Right 3 2 Round Brisk None   Left 3 2 Round Brisk None       Visual Fields (Counting fingers)      Left Right    Full Full       Extraocular Movement      Right Left    Full, Ortho Full, Ortho       Neuro/Psych    Oriented x3: Yes   Mood/Affect: Normal       Dilation    Left eye: 1.0% Mydriacyl, 2.5% Phenylephrine @ 4:01 PM        Slit Lamp and Fundus Exam    Slit Lamp Exam      Right Left   Lids/Lashes Dermatochalasis - upper lid, mild Meibomian gland dysfunction Dermatochalasis - upper lid, mild Meibomian gland dysfunction   Conjunctiva/Sclera White and quiet White and quiet   Cornea Trace Punctate epithelial erosions Trace Punctate epithelial erosions   Anterior Chamber deep and quiet deep and quiet   Iris round and dilated, No NVI round and dilated   Lens 1+ NS, 1+CC 1+NS, 1+CC   Vitreous mild vitreous syneresis mild vitreous syneresis       Fundus Exam      Right Left   Disc Pink and sharp Pink and sharp   C/D Ratio 0.65 0.65   Macula good foveal reflex, scattered punctate exudates/MA/IRH, +cystic changes--improved blunted foveal reflex, scatttered MA's and exudate, +cystic changes   Vessels Vascular attenuation, Tortuous, Copper wiring Vascular attenuation, Tortuous, Copper wiring   Periphery Attached, scattered MA and exudate greatest nasally--improving Attached, scattered MA and exudate greatest nasally, white without pressure temporally          IMAGING AND PROCEDURES  Imaging and Procedures for _0 @  OCT, Retina - OU - Both  Eyes       Right Eye Quality was good. Central Foveal Thickness: 227. Progression has been stable. Findings include normal foveal contour, no SRF, intraretinal fluid, intraretinal hyper-reflective material (persistent IRF/IRHM).   Left Eye Quality was good. Central Foveal Thickness: 264. Progression has been stable. Findings include intraretinal fluid, no SRF, abnormal foveal contour, intraretinal hyper-reflective material (Persistent central IRF, persistent non-central IRF ).   Notes *Images captured and stored on drive  Diagnosis / Impression:  +DME OU-- persistent IRF/IRHM OD: persistent IRF/IRHM OS: Persistent IRF  Clinical management:  See below  Abbreviations: NFP - Normal foveal profile. CME - cystoid macular edema. PED -  pigment epithelial detachment. IRF - intraretinal fluid. SRF - subretinal fluid. EZ - ellipsoid zone. ERM - epiretinal membrane. ORA - outer retinal atrophy. ORT - outer retinal tubulation. SRHM - subretinal hyper-reflective material        Intravitreal Injection, Pharmacologic Agent - OS - Left Eye       Time Out 03/21/2019. 4:14 PM. Confirmed correct patient, procedure, site, and patient consented.   Anesthesia Topical anesthesia was used. Anesthetic medications included Lidocaine 2%, Proparacaine 0.5%.   Procedure Preparation included 5% betadine to ocular surface, eyelid speculum. A supplied (32 g) needle was used.   Injection:  1.25 mg Bevacizumab (AVASTIN) SOLN   NDC: 38882-800-34, Lot: [id number] , Expiration date: 05/24/2019   Route: Intravitreal, Site: Left Eye, Waste: 0 mL  Post-op Post injection exam found visual acuity of at least counting fingers. The patient tolerated the procedure well. There were no complications. The patient received written and verbal post procedure care education.                 ASSESSMENT/PLAN:    ICD-10-CM   1. Moderate nonproliferative diabetic retinopathy of both eyes with macular edema  associated with type 2 diabetes mellitus (HCC)  X58.8325 Intravitreal Injection, Pharmacologic Agent - OS - Left Eye    Bevacizumab (AVASTIN) SOLN 1.25 mg  2. Retinal edema  H35.81 OCT, Retina - OU - Both Eyes  3. Essential hypertension  I10   4. Hypertensive retinopathy of both eyes  H35.033   5. Nuclear sclerosis of both eyes  H25.13     1,2. Moderate non-proliferative diabetic retinopathy w/ DME, OU  - pt lost to f/u from 10.23.2019 to 11.18.20 -- was supposed to return for focal laser for DME  - FA in 2019 showed leaking MA OU; no NV OU  **of note, pt had pruritic rxn to fluorescein dye -- resolved with 12.5 mg IV benadryl**  - FA 12.16.2020 shows late leaking MA's, non-perfusion defects; no NV OU  - exam shows scattered MA, IRH, exudates and mild macular edema OU  - s/p IVA OD #1 (12.16.20), #2 (1.25.21)  - s/p IVA OS #1 OS (12.23.20)  - OCT shows persistent IRF/IRHM OD, persistent IRF OS   - BCVA OD stable at 20/25+2, OS stable at 20/30  - recommend IVA OS # 2 today, 01.27.21  - RBA of procedure discussed, questions answered             - informed consent obtained and signed             - see procedure note  - recommend PRP laser OU in between IVA injections for treatment of capillary nonperfusion  - f/u 4 wks  3,4. Hypertensive retinopathy OU  - discussed importance of tight BP control  - monitor  5. Nuclear Sclerosis   - The symptoms of cataract, surgical options, and treatments and risks were discussed with patient.  - discussed diagnosis and progression  - under the expert management of [place] Ordered this visit:  Meds ordered this encounter  Medications  . Bevacizumab (AVASTIN) SOLN 1.25 mg       Return in about 4 weeks (around 04/18/2019) for Dilated Exam, OCT, Possible Injxn.  There are no Patient Instructions on file for this visit.   Explained the diagnoses, plan, and follow up with the patient and they expressed understanding.   Patient expressed understanding of the importance of proper follow up care.   This document serves as a  record of services personally performed by Gardiner Sleeper, MD, PhD. It was created on their behalf by Estill Bakes, COT an ophthalmic technician. The creation of this record is the provider's dictation and/or activities during the visit.    Electronically signed by: Estill Bakes, COT 03/21/19 @ 1:46 AM   This document serves as a record of services personally performed by Gardiner Sleeper, MD, PhD. It was created on their behalf by Ernest Mallick, OA, an ophthalmic assistant. The creation of this record is the provider's dictation and/or activities during the visit.    Electronically signed by: Ernest Mallick, OA 01.27.2021 1:46 AM  Gardiner Sleeper, M.D., Ph.D. Diseases & Surgery of the Retina and [place] 03/21/2019   I have reviewed the above documentation for accuracy and completeness, and I agree with the above. Gardiner Sleeper, M.D., Ph.D. 03/23/19 1:46 AM    Abbreviations: M myopia (nearsighted); A astigmatism; H hyperopia (farsighted); P presbyopia; Mrx spectacle prescription;  CTL contact lenses; OD right eye; OS left eye; OU both eyes  XT exotropia; ET esotropia; PEK punctate epithelial keratitis; PEE punctate epithelial erosions; DES dry eye syndrome; MGD meibomian gland dysfunction; ATs artificial tears; PFAT's preservative free artificial tears; [place] nuclear sclerotic cataract; PSC posterior subcapsular cataract; ERM epi-retinal membrane; PVD posterior vitreous detachment; RD retinal detachment; DM diabetes mellitus; DR diabetic retinopathy; NPDR non-proliferative diabetic retinopathy; PDR proliferative diabetic retinopathy; CSME clinically significant macular edema; DME diabetic macular edema; dbh dot blot hemorrhages; CWS cotton wool spot; POAG primary open angle glaucoma; C/D cup-to-disc ratio; HVF humphrey visual field; GVF goldmann visual field;  OCT optical coherence tomography; IOP intraocular pressure; BRVO Branch retinal vein occlusion; CRVO central retinal vein occlusion; CRAO central retinal artery occlusion; BRAO branch retinal artery occlusion; RT retinal tear; SB scleral buckle; PPV pars plana vitrectomy; VH Vitreous hemorrhage; PRP panretinal laser photocoagulation; IVK intravitreal kenalog; VMT vitreomacular traction; MH Macular hole;  NVD neovascularization of the disc; NVE neovascularization elsewhere; AREDS age related eye disease study; ARMD age related macular degeneration; POAG primary open angle glaucoma; EBMD epithelial/anterior basement membrane dystrophy; ACIOL anterior chamber intraocular lens; IOL intraocular lens; PCIOL posterior chamber intraocular lens; Phaco/IOL phacoemulsification with intraocular lens placement; [place] photorefractive keratectomy; LASIK laser assisted in situ keratomileusis; HTN hypertension; DM diabetes mellitus; COPD chronic obstructive pulmonary disease

## 2019-03-22 ENCOUNTER — Encounter (INDEPENDENT_AMBULATORY_CARE_PROVIDER_SITE_OTHER): Payer: Medicaid Other | Admitting: Ophthalmology

## 2019-03-23 ENCOUNTER — Encounter (INDEPENDENT_AMBULATORY_CARE_PROVIDER_SITE_OTHER): Payer: Self-pay | Admitting: Ophthalmology

## 2019-03-23 MED ORDER — BEVACIZUMAB CHEMO INJECTION 1.25MG/0.05ML SYRINGE FOR KALEIDOSCOPE
1.2500 mg | INTRAVITREAL | Status: AC | PRN
  Administered 2019-03-23: 02:00:00 1.25 mg via INTRAVITREAL

## 2019-03-28 ENCOUNTER — Telehealth (INDEPENDENT_AMBULATORY_CARE_PROVIDER_SITE_OTHER): Payer: Self-pay

## 2019-04-18 ENCOUNTER — Encounter (INDEPENDENT_AMBULATORY_CARE_PROVIDER_SITE_OTHER): Payer: Medicaid Other | Admitting: Ophthalmology

## 2019-04-20 ENCOUNTER — Encounter (INDEPENDENT_AMBULATORY_CARE_PROVIDER_SITE_OTHER): Payer: Medicaid Other | Admitting: Ophthalmology

## 2019-04-23 NOTE — Progress Notes (Signed)
[facility] Note  04/24/2019     CHIEF COMPLAINT Patient presents for Retina Follow Up   HISTORY OF PRESENT ILLNESS: Ana Long is a 44 y.o. female who presents to the clinic today for:   HPI    Retina Follow Up    Patient presents with  Diabetic Retinopathy.  In both eyes.  This started 3 months ago.  Since onset it is stable.  I, the attending physician,  performed the HPI with the patient and updated documentation appropriately.          Comments    F/U PDR OU. Patient states continues to have occasional spasm OD . Pt does not monitor BS, denies hypo/hyperglycemic episodes.         Last edited by Bernarda Caffey, MD on 04/24/2019  1:38 PM. (History)    pt states her right eye vision is down, she also says that "it jumps a lot", she states she can't look at her phone with her glasses on bc it has a "high glare"  Referring physician: [name] MD [place], [place] [address]  HISTORICAL INFORMATION:   Selected notes from the [facility]-referred by Dr. [name] for DM exam    CURRENT MEDICATIONS: No current outpatient medications on file. (Ophthalmic Drugs)   No current facility-administered medications for this visit. (Ophthalmic Drugs)   Current Outpatient Medications (Other)  Medication Sig  . acyclovir (ZOVIRAX) 400 MG tablet TAKE 1 TABLET BY MOUTH TWICE A DAY  . albuterol (PROAIR HFA) 108 (90 Base) MCG/ACT inhaler Inhale 2 puffs into the lungs every 4 (four) hours as needed for wheezing or shortness of breath (cough). (Patient taking differently: Inhale 2 puffs into the lungs every 4 (four) hours as needed for wheezing or shortness of breath (or coughing). )  . atorvastatin (LIPITOR) 40 MG tablet Take 1 tablet (40 mg total) by mouth at bedtime.  . benzonatate (TESSALON) 200 MG capsule Take 1 capsule (200 mg total) by mouth 3 (three) times daily as needed for cough.  . Blood Glucose Monitoring Suppl  (ACCU-CHEK AVIVA PLUS) w/Device KIT Please check you blood sugars 4 times a day  . dapagliflozin propanediol (FARXIGA) 5 MG TABS tablet Take 5 mg by mouth daily. (Patient taking differently: Take 5 mg by mouth 2 (two) times a day. )  . diclofenac (VOLTAREN) 75 MG EC tablet Take 1 tablet (75 mg total) by mouth 2 (two) times daily. (Patient taking differently: Take 75 mg by mouth 2 (two) times daily as needed (for pain or inflammation). )  . fluconazole (DIFLUCAN) 150 MG tablet TAKE 1 TABLET BY MOUTH NOW. THEN REPEAT IN 1 WEEK  . furosemide (LASIX) 20 MG tablet Take 20 mg by mouth daily as needed for fluid or edema.   Marland Kitchen glucose blood (ACCU-CHEK SMARTVIEW) test strip TEST FOUR TIMES DAILY  . hydrochlorothiazide (HYDRODIURIL) 12.5 MG tablet Take by mouth.  Marland Kitchen ibuprofen (ADVIL) 600 MG tablet Take 1 tablet (600 mg total) by mouth every 8 (eight) hours as needed. (Patient taking differently: Take 600 mg by mouth every 8 (eight) hours as needed for moderate pain. )  . insulin aspart (NOVOLOG) 100 UNIT/ML injection Inject 0-20 Units into the skin every 4 (four) hours. CBG 70 - 120: 0 units  CBG 121 - 150: 3 units  CBG 151 - 200: 4 units  CBG 201 - 250: 7 units  CBG 251 - 300: 11 units  CBG 301 - 350: 15 units  CBG 351 - 400: 20 units  CBG > 400 call MD (Patient taking differently: Inject 20 Units into the skin every 4 (four) hours. CBG 70 - 120: 0 units  CBG 121 - 150: 3 units  CBG 151 - 200: 4 units  CBG 201 - 250: 7 units  CBG 251 - 300: 11 units  CBG 301 - 350: 15 units  CBG 351 - 400: 20 units  CBG > 400 call MD)  . insulin aspart protamine - aspart (NOVOLOG MIX 70/30 FLEXPEN) (70-30) 100 UNIT/ML FlexPen Inject into the skin.  . Insulin Pen Needle (B-D UF III MINI PEN NEEDLES) 31G X 5 MM MISC USE AS DIRECTED TO INJECT INSULIN FIVE A DAY E11.65  . JANUVIA 100 MG tablet TAKE 1 TABLET BY MOUTH EVERY DAY  . lactulose (CEPHULAC) 10 g packet Take 1 packet (10 g total) by mouth daily as needed. Reported  on 07/29/2015 (Patient taking differently: Take 10 g by mouth daily as needed (for constipation). )  . Lancets (ACCU-CHEK SOFT TOUCH) lancets Use as instructed  . liraglutide (VICTOZA) 18 MG/3ML SOPN Take 0.20m daily for 1 week, then 1.246mdaily for 1 week, then 1.17m49maily.  . lMarland Kitchenratadine (CLARITIN) 10 MG tablet Take 1 tablet (10 mg total) by mouth 2 (two) times daily.  . meloxicam (MOBIC) 15 MG tablet TAKE 1 TABLET BY MOUTH ONCE A DAY TAKE DAILY FOR TWO WEEKS, AND THEN ONLY AS NEEDED AFTERWARDS.  . mMarland Kitchenthocarbamol (ROBAXIN) 500 MG tablet Take 1 tablet (500 mg total) by mouth every 8 (eight) hours as needed. (Patient taking differently: Take 500 mg by mouth every 8 (eight) hours as needed for muscle spasms. )  . mometasone-formoterol (DULERA) 200-5 MCG/ACT AERO Inhale 2 puffs into the lungs 2 (two) times daily.  . montelukast (SINGULAIR) 10 MG tablet Take 1 tablet (10 mg total) by mouth at bedtime.  . nitroGLYCERIN (NITRODUR - DOSED IN MG/24 HR) 0.2 mg/hr patch Apply 1/4th patch to affected achilles, change daily  . omeprazole (PRILOSEC) 20 MG capsule Take 1 capsule (20 mg total) by mouth daily before breakfast.  . Insulin Glargine (LANTUS) 100 UNIT/ML Solostar Pen Inject 80 Units into the skin 2 (two) times daily.   Current Facility-Administered Medications (Other)  Medication Route  . diphenhydrAMINE (BENADRYL) injection 12.5 mg Intramuscular      REVIEW OF SYSTEMS: ROS    Positive for: Eyes   Negative for: Constitutional, Gastrointestinal, Neurological, Skin, Genitourinary, Musculoskeletal, HENT, Endocrine, Cardiovascular, Respiratory, Psychiatric, Allergic/Imm, Heme/Lymph   Last edited by KenZenovia JordanPN on 3/27/08/8240:13:53. (History)       ALLERGIES Allergies  Allergen Reactions  . Bee Venom Anaphylaxis and Swelling  . Wasp Venom Anaphylaxis and Swelling  . Citrus Hives and Itching  . Latex Hives and Swelling  . Penicillins Hives and Itching    Has patient had a PCN  reaction causing immediate rash, facial/tongue/throat swelling, SOB or lightheadedness with hypotension: Yes Has patient had a PCN reaction causing severe rash involving mucus membranes or skin necrosis: Unk Has patient had a PCN reaction that required hospitalization: Was already in hosp Has patient had a PCN reaction occurring within the last 10 years: No If all of the above answers are "NO", then may proceed with Cephalosporin use.   . Shellfish Allergy Other (See Comments) and Swelling    Patient is uncertain; stated that she tolerates this (??)  . Soap Hives, Itching and Swelling  PUREX LAUNDRY DETERGENT  . Tomato Hives and Itching  . Banana Itching and Nausea Only  . Chocolate Itching  . Fluticasone Other (See Comments)    Per patient, makes sneeze and gag  . Hydrocodone Itching and Nausea Only  . Other Itching    Acidic foods- Itching [place]- Itching in the roof of the mouth  . Adhesive [Tape] Itching and Rash    Allergic to  "leads"  . Doxycycline Other (See Comments)    AFFECTS JOINTS  . Metformin And Related Nausea Only, Rash, Other (See Comments) and Diarrhea    GI UPSET, also  . Red Dye Itching, Rash and Other (See Comments)    At eye doctor's office, the dilating drops- Skin breaks out, chest turned red, itching, face became swollen    PAST MEDICAL HISTORY Past Medical History:  Diagnosis Date  . Acute nonintractable headache 12/26/2017  . Arthritis    major joints  . Asthma    prn inhaler  . Asthma exacerbation 07/25/2013  . Asthma in adult, severe persistent, with acute exacerbation 01/11/2018  . Carpal tunnel syndrome of right wrist 09/2012  . Cataract    NS OU  . Complication of anesthesia    states is hard to wake up post-op  . Constipation 12/30/2014  . Diabetes mellitus    intolerant to Metformin  . Diabetic retinopathy ([place])    NPDR OU  . Dry skin    hands   . Family history of anesthesia complication    mother went into coma during c-section  .  Genital herpes 01/01/2006   on acyclovir BID   . GERD (gastroesophageal reflux disease)   . Herpes genital   . Hyperglycemia 12/26/2017  . Hypertensive retinopathy    OU  . Low back pain 11/22/2012  . Lower extremity edema 10/18/2012  . Menorrhagia 11/06/2015  . Morbid obesity with BMI of 50 to 60 07/23/2009  . Obesity hypoventilation syndrome ([place]) 10/30/2012  . Obesity, morbid ([place]) 01/10/2014  . OSA on CPAP     AHI was on  11-17-12 to 120/hr, titrated to 15 cm with 3 cm EPR/   . PCOS (polycystic ovarian syndrome)   . Rheumatoid arthritis ([place])   . Right ankle pain 06/19/2015  . Right hip pain 09/04/2017  . Right shoulder pain 06/01/2017  . Seasonal allergies   . Sleep apnea, obstructive    uses CPAP nightly - had sleep study 08/22/2007   Past Surgical History:  Procedure Laterality Date  . CARPAL TUNNEL RELEASE Right 10/02/2012   Procedure: RIGHT CARPAL TUNNEL RELEASE ENDOSCOPIC;  Surgeon: Jolyn Nap, MD;  Location: [facility];  Service: Orthopedics;  Laterality: Right;  . CHOLECYSTECTOMY N/A 12/02/2014   Procedure: LAPAROSCOPIC CHOLECYSTECTOMY;  Surgeon: Armandina Gemma, MD;  Location: [facility];  Service: General;  Laterality: N/A;  . HYSTEROSCOPY WITH D & C  01-08-2002    FAMILY HISTORY Family History  Problem Relation Age of Onset  . Other Mother   . Leukemia Cousin   . Other Other   . Obesity Other   . Other Other   . Diabetes Maternal Aunt   . Heart attack Maternal Aunt   . Alzheimer's disease Maternal Uncle   . Cancer Maternal Uncle        prostate  . Diabetes Paternal Aunt   . Cancer Paternal Aunt        brain,mouth  . Alzheimer's disease Paternal Aunt   . Diabetes Paternal Uncle   . Diabetes  Maternal Grandmother   . Diabetes Maternal Grandfather     SOCIAL HISTORY Social History   Tobacco Use  . Smoking status: Former Smoker    Years: 10.00    Quit date: 05/24/2010    Years since quitting: 8.9  . Smokeless tobacco: Never Used  Substance Use  Topics  . Alcohol use: Yes    Alcohol/week: 0.0 standard drinks    Comment: socially  . Drug use: No         OPHTHALMIC EXAM:  Base Eye Exam    Visual Acuity (Snellen - Linear)      Right Left   Dist cc 20/50 +1 20/25 -1   Dist ph cc 20/40 -2 NI   Correction: Glasses       Tonometry (Tonopen, 1:22 PM)      Right Left   Pressure 12 10       Pupils      Dark Light Shape React APD   Right 3 2 Round Brisk None   Left 3 2 Round Brisk None       Visual Fields (Counting fingers)      Left Right    Full Full       Extraocular Movement      Right Left    Full, Ortho Full, Ortho       Neuro/Psych    Oriented x3: Yes   Mood/Affect: Normal       Dilation    Both eyes: 1.0% Mydriacyl, 2.5% Phenylephrine @ 1:18 PM        Slit Lamp and Fundus Exam    Slit Lamp Exam      Right Left   Lids/Lashes Dermatochalasis - upper lid, mild Meibomian gland dysfunction Dermatochalasis - upper lid, mild Meibomian gland dysfunction   Conjunctiva/Sclera White and quiet White and quiet   Cornea 1+Punctate epithelial erosions 1-2+Punctate epithelial erosions   Anterior Chamber deep and quiet deep and quiet   Iris round and dilated, No NVI round and dilated   Lens 1+ NS, 1+CC 1+NS, 1+CC   Vitreous mild vitreous syneresis mild vitreous syneresis       Fundus Exam      Right Left   Disc Pink and sharp Pink and sharp   C/D Ratio 0.6 0.6   Macula good foveal reflex, scattered punctate exudates/MA/IRH, +mild non-central cystic changes  blunted foveal reflex, scatttered MA's and exudate, +cystic changes   Vessels Vascular attenuation, Tortuous, Copper wiring Vascular attenuation, Tortuous, Copper wiring   Periphery Attached, scattered MA and exudate greatest nasally -- improving Attached, scattered MA and exudate greatest nasally, white without pressure temporally          IMAGING AND PROCEDURES  Imaging and Procedures for @TODAY @  OCT, Retina - OU - Both Eyes       Right  Eye Quality was good. Central Foveal Thickness: 222. Progression has been stable. Findings include normal foveal contour, no SRF, intraretinal fluid, intraretinal hyper-reflective material (persistent IRF/IRHM).   Left Eye Quality was good. Central Foveal Thickness: 261. Progression has been stable. Findings include intraretinal fluid, no SRF, abnormal foveal contour, intraretinal hyper-reflective material (Interval improvement in IRF SN macula, persistent foveal IRF).   Notes *Images captured and stored on drive  Diagnosis / Impression:  +DME OU-- persistent IRF/IRHM OD: persistent IRF/IRHM OS: Interval improvement in IRF SN macula, persistent foveal IRF  Clinical management:  See below  Abbreviations: NFP - Normal foveal profile. CME - cystoid macular edema. PED - pigment epithelial detachment.  IRF - intraretinal fluid. SRF - subretinal fluid. EZ - ellipsoid zone. ERM - epiretinal membrane. ORA - outer retinal atrophy. ORT - outer retinal tubulation. SRHM - subretinal hyper-reflective material        Intravitreal Injection, Pharmacologic Agent - OS - Left Eye       Time Out 04/24/2019. 2:22 PM. Confirmed correct patient, procedure, site, and patient consented.   Anesthesia Topical anesthesia was used. Anesthetic medications included Lidocaine 2%, Proparacaine 0.5%.   Procedure Preparation included 5% betadine to ocular surface, eyelid speculum. A supplied needle was used.   Injection:  1.25 mg Bevacizumab (AVASTIN) SOLN   NDC: 48185-631-49, Lot: [id number] , Expiration date: 05/09/2019   Route: Intravitreal, Site: Left Eye, Waste: 0 mg  Post-op Post injection exam found visual acuity of at least counting fingers. The patient tolerated the procedure well. There were no complications. The patient received written and verbal post procedure care education.                 ASSESSMENT/PLAN:    ICD-10-CM   1. 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 Retinal edema  H35.81 OCT, Retina - OU - Both Eyes  3. Essential hypertension  I10   4. Hypertensive retinopathy of both eyes  H35.033   5. Nuclear sclerosis of both eyes  H25.13     1,2. Moderate non-proliferative diabetic retinopathy w/ DME, OU  - pt lost to f/u from 10.23.2019 to 11.18.20 -- was supposed to return for focal laser for DME  - FA in 2019 showed leaking MA OU; no NV OU  **of note, pt had pruritic rxn to fluorescein dye -- resolved with 12.5 mg IV benadryl**  - FA 12.16.2020 shows late leaking MA's, non-perfusion defects; no NV OU  - exam shows scattered MA, IRH, exudates and mild macular edema OU  - s/p IVA OD #1 (12.16.20), #2 (01.25.21)  - s/p IVA OS #1 OS (12.23.20), #2 (01.27.21)  - OCT shows persistent IRF/IRHM OD; OS-- Interval improvement in IRF SN macula, persistent foveal IRF OS   - BCVA OD decreased to 20/40-2 from 20/25+2, OS improved to 20/25 from 20/30  - recommend IVA OS # 3 today, 03.02.21  - RBA of procedure discussed, questions answered             - informed consent obtained  - Avastin informed consent form signed and scanned on 01.27.2021             - see procedure note  - f/u 1-2 wks, DILATE OD ONLY, OCT, focal laser OD  3,4. Hypertensive retinopathy OU  - discussed importance of tight BP control  - monitor  5. Nuclear Sclerosis   - The symptoms of cataract, surgical options, and treatments and risks were discussed with patient.  - discussed diagnosis and progression  - under the expert management of [place] Ordered this visit:  Meds ordered this encounter  Medications  . Bevacizumab (AVASTIN) SOLN 1.25 mg       Return in about 2 weeks (around 05/08/2019) for f/u 2-3 weeks, NPDR OU, DFE OD ONLY, OCT.  There are no Patient Instructions on  file for this visit.   Explained the diagnoses, plan, and follow up with the patient and they expressed understanding.  Patient expressed understanding of  the importance of proper follow up care.   This document serves as a record of services personally performed by Gardiner Sleeper, MD, PhD. It was created on their behalf by Estill Bakes, COT an ophthalmic technician. The creation of this record is the provider's dictation and/or activities during the visit.    Electronically signed by: Estill Bakes, COT 04/23/19 @ 4:55 PM   This document serves as a record of services personally performed by Gardiner Sleeper, MD, PhD. It was created on their behalf by Ernest Mallick, OA, an ophthalmic assistant. The creation of this record is the provider's dictation and/or activities during the visit.    Electronically signed by: Ernest Mallick, OA 03.02.2021 4:55 PM  Gardiner Sleeper, M.D., Ph.D. Diseases & Surgery of the Retina and [place] 04/24/2019   I have reviewed the above documentation for accuracy and completeness, and I agree with the above. Gardiner Sleeper, M.D., Ph.D. 04/24/19 4:55 PM   Abbreviations: M myopia (nearsighted); A astigmatism; H hyperopia (farsighted); P presbyopia; Mrx spectacle prescription;  CTL contact lenses; OD right eye; OS left eye; OU both eyes  XT exotropia; ET esotropia; PEK punctate epithelial keratitis; PEE punctate epithelial erosions; DES dry eye syndrome; MGD meibomian gland dysfunction; ATs artificial tears; PFAT's preservative free artificial tears; [place] nuclear sclerotic cataract; PSC posterior subcapsular cataract; ERM epi-retinal membrane; PVD posterior vitreous detachment; RD retinal detachment; DM diabetes mellitus; DR diabetic retinopathy; NPDR non-proliferative diabetic retinopathy; PDR proliferative diabetic retinopathy; CSME clinically significant macular edema; DME diabetic macular edema; dbh dot blot hemorrhages; CWS cotton  wool spot; POAG primary open angle glaucoma; C/D cup-to-disc ratio; HVF humphrey visual field; GVF goldmann visual field; OCT optical coherence tomography; IOP intraocular pressure; BRVO Branch retinal vein occlusion; CRVO central retinal vein occlusion; CRAO central retinal artery occlusion; BRAO branch retinal artery occlusion; RT retinal tear; SB scleral buckle; PPV pars plana vitrectomy; VH Vitreous hemorrhage; PRP panretinal laser photocoagulation; IVK intravitreal kenalog; VMT vitreomacular traction; MH Macular hole;  NVD neovascularization of the disc; NVE neovascularization elsewhere; AREDS age related eye disease study; ARMD age related macular degeneration; POAG primary open angle glaucoma; EBMD epithelial/anterior basement membrane dystrophy; ACIOL anterior chamber intraocular lens; IOL intraocular lens; PCIOL posterior chamber intraocular lens; Phaco/IOL phacoemulsification with intraocular lens placement; [place] photorefractive keratectomy; LASIK laser assisted in situ keratomileusis; HTN hypertension; DM diabetes mellitus; COPD chronic obstructive pulmonary disease

## 2019-04-24 ENCOUNTER — Ambulatory Visit (INDEPENDENT_AMBULATORY_CARE_PROVIDER_SITE_OTHER): Payer: Medicaid Other | Admitting: Ophthalmology

## 2019-04-24 ENCOUNTER — Encounter (INDEPENDENT_AMBULATORY_CARE_PROVIDER_SITE_OTHER): Payer: Self-pay | Admitting: Ophthalmology

## 2019-04-24 DIAGNOSIS — H3581 Retinal edema: Secondary | ICD-10-CM

## 2019-04-24 DIAGNOSIS — H35033 Hypertensive retinopathy, bilateral: Secondary | ICD-10-CM | POA: Diagnosis not present

## 2019-04-24 DIAGNOSIS — H2513 Age-related nuclear cataract, bilateral: Secondary | ICD-10-CM

## 2019-04-24 DIAGNOSIS — I10 Essential (primary) hypertension: Secondary | ICD-10-CM | POA: Diagnosis not present

## 2019-04-24 DIAGNOSIS — E113313 Type 2 diabetes mellitus with moderate nonproliferative diabetic retinopathy with macular edema, bilateral: Secondary | ICD-10-CM | POA: Diagnosis not present

## 2019-04-24 MED ORDER — BEVACIZUMAB CHEMO INJECTION 1.25MG/0.05ML SYRINGE FOR KALEIDOSCOPE
1.2500 mg | INTRAVITREAL | Status: AC | PRN
  Administered 2019-04-24: 14:00:00 1.25 mg via INTRAVITREAL

## 2019-05-09 ENCOUNTER — Encounter (INDEPENDENT_AMBULATORY_CARE_PROVIDER_SITE_OTHER): Payer: Medicaid Other | Admitting: Ophthalmology

## 2019-05-10 ENCOUNTER — Other Ambulatory Visit: Payer: Self-pay | Admitting: Internal Medicine

## 2019-05-10 DIAGNOSIS — E1165 Type 2 diabetes mellitus with hyperglycemia: Secondary | ICD-10-CM

## 2019-05-11 ENCOUNTER — Ambulatory Visit (INDEPENDENT_AMBULATORY_CARE_PROVIDER_SITE_OTHER): Payer: Medicaid Other | Admitting: Ophthalmology

## 2019-05-11 ENCOUNTER — Encounter (INDEPENDENT_AMBULATORY_CARE_PROVIDER_SITE_OTHER): Payer: Self-pay | Admitting: Ophthalmology

## 2019-05-11 DIAGNOSIS — E113313 Type 2 diabetes mellitus with moderate nonproliferative diabetic retinopathy with macular edema, bilateral: Secondary | ICD-10-CM

## 2019-05-11 DIAGNOSIS — I10 Essential (primary) hypertension: Secondary | ICD-10-CM | POA: Diagnosis not present

## 2019-05-11 DIAGNOSIS — H35033 Hypertensive retinopathy, bilateral: Secondary | ICD-10-CM

## 2019-05-11 DIAGNOSIS — H3581 Retinal edema: Secondary | ICD-10-CM | POA: Diagnosis not present

## 2019-05-11 DIAGNOSIS — H2513 Age-related nuclear cataract, bilateral: Secondary | ICD-10-CM

## 2019-05-11 MED ORDER — PREDNISOLONE ACETATE 1 % OP SUSP: 1.0000 [drp] | Freq: Four times a day (QID) | OPHTHALMIC | 0 refills | Status: AC

## 2019-05-11 NOTE — Progress Notes (Signed)
[facility] Note  05/11/2019     CHIEF COMPLAINT Patient presents for Retina Follow Up   HISTORY OF PRESENT ILLNESS: Ana Long is a 44 y.o. female who presents to the clinic today for:   HPI    Retina Follow Up    Patient presents with  Diabetic Retinopathy.  In both eyes.  Severity is moderate.  Duration of 2 weeks.  Since onset it is stable.  I, the attending physician,  performed the HPI with the patient and updated documentation appropriately.          Comments    Pt states vision is stable from last visit, she does not have to wear glasses for reading, she denies fol, floaters and eye pain, she states they have been itching, watering and running "white, crusty stuff"       Last edited by Bernarda Caffey, MD on 05/11/2019  5:15 PM. (History)    pt states her right eye vision is down, she also says that "it jumps a lot", she states she can't look at her phone with her glasses on bc it has a "high glare"  Referring physician: [name] MD [place], [place] [address]  HISTORICAL INFORMATION:   Selected notes from the [facility] Re-referred by Dr. [name] for DM exam    CURRENT MEDICATIONS: Current Outpatient Medications (Ophthalmic Drugs)  Medication Sig  . prednisoLONE acetate (PRED FORTE) 1 % ophthalmic suspension Place 1 drop into the right eye 4 (four) times daily for 7 days.   No current facility-administered medications for this visit. (Ophthalmic Drugs)   Current Outpatient Medications (Other)  Medication Sig  . acyclovir (ZOVIRAX) 400 MG tablet TAKE 1 TABLET BY MOUTH TWICE A DAY  . albuterol (PROAIR HFA) 108 (90 Base) MCG/ACT inhaler Inhale 2 puffs into the lungs every 4 (four) hours as needed for wheezing or shortness of breath (cough). (Patient taking differently: Inhale 2 puffs into the lungs every 4 (four) hours as needed for wheezing or shortness of breath (or coughing). )  . atorvastatin  (LIPITOR) 40 MG tablet Take 1 tablet (40 mg total) by mouth at bedtime.  . benzonatate (TESSALON) 200 MG capsule Take 1 capsule (200 mg total) by mouth 3 (three) times daily as needed for cough.  . Blood Glucose Monitoring Suppl (ACCU-CHEK AVIVA PLUS) w/Device KIT Please check you blood sugars 4 times a day  . dapagliflozin propanediol (FARXIGA) 5 MG TABS tablet Take 5 mg by mouth daily. (Patient taking differently: Take 5 mg by mouth 2 (two) times a day. )  . diclofenac (VOLTAREN) 75 MG EC tablet Take 1 tablet (75 mg total) by mouth 2 (two) times daily. (Patient taking differently: Take 75 mg by mouth 2 (two) times daily as needed (for pain or inflammation). )  . fluconazole (DIFLUCAN) 150 MG tablet TAKE 1 TABLET BY MOUTH NOW. THEN REPEAT IN 1 WEEK  . furosemide (LASIX) 20 MG tablet Take 20 mg by mouth daily as needed for fluid or edema.   Marland Kitchen glucose blood (ACCU-CHEK SMARTVIEW) test strip TEST FOUR TIMES DAILY  . hydrochlorothiazide (HYDRODIURIL) 12.5 MG tablet Take by mouth.  Marland Kitchen ibuprofen (ADVIL) 600 MG tablet Take 1 tablet (600 mg total) by mouth every 8 (eight) hours as needed. (Patient taking differently: Take 600 mg by mouth every 8 (eight) hours as needed for moderate pain. )  . insulin aspart (NOVOLOG) 100 UNIT/ML injection Inject 0-20 Units into  the skin every 4 (four) hours. CBG 70 - 120: 0 units  CBG 121 - 150: 3 units  CBG 151 - 200: 4 units  CBG 201 - 250: 7 units  CBG 251 - 300: 11 units  CBG 301 - 350: 15 units  CBG 351 - 400: 20 units  CBG > 400 call MD (Patient taking differently: Inject 20 Units into the skin every 4 (four) hours. CBG 70 - 120: 0 units  CBG 121 - 150: 3 units  CBG 151 - 200: 4 units  CBG 201 - 250: 7 units  CBG 251 - 300: 11 units  CBG 301 - 350: 15 units  CBG 351 - 400: 20 units  CBG > 400 call MD)  . insulin aspart protamine - aspart (NOVOLOG MIX 70/30 FLEXPEN) (70-30) 100 UNIT/ML FlexPen Inject into the skin.  . Insulin Glargine (LANTUS) 100 UNIT/ML  Solostar Pen Inject 80 Units into the skin 2 (two) times daily.  . Insulin Pen Needle (B-D UF III MINI PEN NEEDLES) 31G X 5 MM MISC USE AS DIRECTED TO INJECT INSULIN FIVE A DAY E11.65  . JANUVIA 100 MG tablet TAKE 1 TABLET BY MOUTH EVERY DAY  . lactulose (CEPHULAC) 10 g packet Take 1 packet (10 g total) by mouth daily as needed. Reported on 07/29/2015 (Patient taking differently: Take 10 g by mouth daily as needed (for constipation). )  . Lancets (ACCU-CHEK SOFT TOUCH) lancets Use as instructed  . liraglutide (VICTOZA) 18 MG/3ML SOPN Take 0.15m daily for 1 week, then 1.285mdaily for 1 week, then 1.56m69maily.  . lMarland Kitchenratadine (CLARITIN) 10 MG tablet Take 1 tablet (10 mg total) by mouth 2 (two) times daily.  . meloxicam (MOBIC) 15 MG tablet TAKE 1 TABLET BY MOUTH ONCE A DAY TAKE DAILY FOR TWO WEEKS, AND THEN ONLY AS NEEDED AFTERWARDS.  . mMarland Kitchenthocarbamol (ROBAXIN) 500 MG tablet Take 1 tablet (500 mg total) by mouth every 8 (eight) hours as needed. (Patient taking differently: Take 500 mg by mouth every 8 (eight) hours as needed for muscle spasms. )  . mometasone-formoterol (DULERA) 200-5 MCG/ACT AERO Inhale 2 puffs into the lungs 2 (two) times daily.  . montelukast (SINGULAIR) 10 MG tablet Take 1 tablet (10 mg total) by mouth at bedtime.  . nitroGLYCERIN (NITRODUR - DOSED IN MG/24 HR) 0.2 mg/hr patch Apply 1/4th patch to affected achilles, change daily  . omeprazole (PRILOSEC) 20 MG capsule Take 1 capsule (20 mg total) by mouth daily before breakfast.   Current Facility-Administered Medications (Other)  Medication Route  . diphenhydrAMINE (BENADRYL) injection 12.5 mg Intramuscular      REVIEW OF SYSTEMS: ROS    Positive for: Endocrine, Cardiovascular, Eyes, Respiratory   Negative for: Constitutional, Gastrointestinal, Neurological, Skin, Genitourinary, Musculoskeletal, HENT, Psychiatric, Allergic/Imm, Heme/Lymph   Last edited by BroDebbrah AlarOT on 05/11/2019  3:39 PM. (History)        ALLERGIES Allergies  Allergen Reactions  . Bee Venom Anaphylaxis and Swelling  . Wasp Venom Anaphylaxis and Swelling  . Citrus Hives and Itching  . Latex Hives and Swelling  . Penicillins Hives and Itching    Has patient had a PCN reaction causing immediate rash, facial/tongue/throat swelling, SOB or lightheadedness with hypotension: Yes Has patient had a PCN reaction causing severe rash involving mucus membranes or skin necrosis: Unk Has patient had a PCN reaction that required hospitalization: Was already in hosp Has patient had a PCN reaction occurring within the last 10 years: No If all  of the above answers are "NO", then may proceed with Cephalosporin use.   . Shellfish Allergy Other (See Comments) and Swelling    Patient is uncertain; stated that she tolerates this (??)  . Soap Hives, Itching and Swelling    PUREX LAUNDRY DETERGENT  . Tomato Hives and Itching  . Banana Itching and Nausea Only  . Chocolate Itching  . Fluticasone Other (See Comments)    Per patient, makes sneeze and gag  . Hydrocodone Itching and Nausea Only  . Other Itching    Acidic foods- Itching [place]- Itching in the roof of the mouth  . Adhesive [Tape] Itching and Rash    Allergic to  "leads"  . Doxycycline Other (See Comments)    AFFECTS JOINTS  . Metformin And Related Nausea Only, Rash, Other (See Comments) and Diarrhea    GI UPSET, also  . Red Dye Itching, Rash and Other (See Comments)    At eye doctor's office, the dilating drops- Skin breaks out, chest turned red, itching, face became swollen    PAST MEDICAL HISTORY Past Medical History:  Diagnosis Date  . Acute nonintractable headache 12/26/2017  . Arthritis    major joints  . Asthma    prn inhaler  . Asthma exacerbation 07/25/2013  . Asthma in adult, severe persistent, with acute exacerbation 01/11/2018  . Carpal tunnel syndrome of right wrist 09/2012  . Cataract    NS OU  . Complication of anesthesia    states is hard to wake  up post-op  . Constipation 12/30/2014  . Diabetes mellitus    intolerant to Metformin  . Diabetic retinopathy ([place])    NPDR OU  . Dry skin    hands   . Family history of anesthesia complication    mother went into coma during c-section  . Genital herpes 01/01/2006   on acyclovir BID   . GERD (gastroesophageal reflux disease)   . Herpes genital   . Hyperglycemia 12/26/2017  . Hypertensive retinopathy    OU  . Low back pain 11/22/2012  . Lower extremity edema 10/18/2012  . Menorrhagia 11/06/2015  . Morbid obesity with BMI of 50 to 60 07/23/2009  . Obesity hypoventilation syndrome ([place]) 10/30/2012  . Obesity, morbid ([place]) 01/10/2014  . OSA on CPAP     AHI was on  11-17-12 to 120/hr, titrated to 15 cm with 3 cm EPR/   . PCOS (polycystic ovarian syndrome)   . Rheumatoid arthritis ([place])   . Right ankle pain 06/19/2015  . Right hip pain 09/04/2017  . Right shoulder pain 06/01/2017  . Seasonal allergies   . Sleep apnea, obstructive    uses CPAP nightly - had sleep study 08/22/2007   Past Surgical History:  Procedure Laterality Date  . CARPAL TUNNEL RELEASE Right 10/02/2012   Procedure: RIGHT CARPAL TUNNEL RELEASE ENDOSCOPIC;  Surgeon: Jolyn Nap, MD;  Location: [facility];  Service: Orthopedics;  Laterality: Right;  . CHOLECYSTECTOMY N/A 12/02/2014   Procedure: LAPAROSCOPIC CHOLECYSTECTOMY;  Surgeon: Armandina Gemma, MD;  Location: [facility];  Service: General;  Laterality: N/A;  . HYSTEROSCOPY WITH D & C  01-08-2002    FAMILY HISTORY Family History  Problem Relation Age of Onset  . Other Mother   . Leukemia Cousin   . Other Other   . Obesity Other   . Other Other   . Diabetes Maternal Aunt   . Heart attack Maternal Aunt   . Alzheimer's disease Maternal Uncle   . Cancer Maternal  Uncle        prostate  . Diabetes Paternal Aunt   . Cancer Paternal Aunt        brain,mouth  . Alzheimer's disease Paternal Aunt   . Diabetes Paternal Uncle   . Diabetes Maternal  Grandmother   . Diabetes Maternal Grandfather     SOCIAL HISTORY Social History   Tobacco Use  . Smoking status: Former Smoker    Years: 10.00    Quit date: 05/24/2010    Years since quitting: 8.9  . Smokeless tobacco: Never Used  Substance Use Topics  . Alcohol use: Yes    Alcohol/week: 0.0 standard drinks    Comment: socially  . Drug use: No         OPHTHALMIC EXAM:  Base Eye Exam    Visual Acuity (Snellen - Linear)      Right Left   Dist cc 20/40 +2 20/25 -2   Dist ph cc 20/25 +1 NI   Correction: Glasses       Tonometry (Tonopen, 3:21 PM)      Right Left   Pressure 20 18       Visual Fields      Left Right    Full Full       Extraocular Movement      Right Left    Full Full       Neuro/Psych    Oriented x3: Yes   Mood/Affect: Normal       Dilation    Right eye: 1.0% Mydriacyl, 2.5% Phenylephrine @ 3:21 PM        Slit Lamp and Fundus Exam    Slit Lamp Exam      Right Left   Lids/Lashes Dermatochalasis - upper lid, mild Meibomian gland dysfunction Dermatochalasis - upper lid, mild Meibomian gland dysfunction   Conjunctiva/Sclera White and quiet White and quiet   Cornea 1+Punctate epithelial erosions 1-2+Punctate epithelial erosions   Anterior Chamber deep and quiet deep and quiet   Iris round and dilated, No NVI round and dilated   Lens 1+ NS, 1+CC 1+NS, 1+CC   Vitreous mild vitreous syneresis mild vitreous syneresis       Fundus Exam      Right Left   Disc Pink and sharp Pink and sharp   C/D Ratio 0.6 0.6   Macula good foveal reflex, scattered punctate exudates/MA/IRH, +mild non-central cystic changes  blunted foveal reflex, scatttered MA's and exudate, +cystic changes   Vessels Vascular attenuation, Tortuous, Copper wiring Vascular attenuation, Tortuous, Copper wiring   Periphery Attached, scattered MA and exudate greatest nasally -- improving Attached, scattered MA and exudate greatest nasally, white without pressure temporally           IMAGING AND PROCEDURES  Imaging and Procedures for _0 @  OCT, Retina - OU - Both Eyes       Right Eye Quality was good. Central Foveal Thickness: 222. Progression has been stable. Findings include normal foveal contour, no SRF, intraretinal fluid, intraretinal hyper-reflective material (persistent IRF/IRHM).   Left Eye Quality was good. Central Foveal Thickness: 258. Progression has been stable. Findings include intraretinal fluid, no SRF, abnormal foveal contour, intraretinal hyper-reflective material (Mild Interval improvement in IRF).   Notes *Images captured and stored on drive  Diagnosis / Impression:  +DME OU-- persistent IRF/IRHM OD: persistent IRF/IRHM OS: mild interval improvement in IRF   Clinical management:  See below  Abbreviations: NFP - Normal foveal profile. CME - cystoid macular edema. PED - pigment epithelial detachment.  IRF - intraretinal fluid. SRF - subretinal fluid. EZ - ellipsoid zone. ERM - epiretinal membrane. ORA - outer retinal atrophy. ORT - outer retinal tubulation. SRHM - subretinal hyper-reflective material        Focal Laser - OD - Right Eye       LASER PROCEDURE NOTE  Diagnosis:   Diabetic macular edema, right eye  Procedure:  Focal laser photocoagulation using slit lamp laser, right eye  Anesthesia:  Topical  Surgeon: Bernarda Caffey, MD, PhD   Informed consent obtained, operative eye marked, and time out performed prior to initiation of laser.   L[place]is [id number] Focal/Grid laser Power: 80 mW Duration: 50 msec  Spot size: 100 microns  # spots: 70 spots placed to MAs temporal to fovea and along temporal arcades  Complications: None.  RTC: 2-3 wks  Patient tolerated the procedure well and received written and verbal post-procedure care information/education.                   ASSESSMENT/PLAN:    ICD-10-CM   1. Moderate nonproliferative diabetic retinopathy of both eyes with macular edema associated with  type 2 diabetes mellitus (HCC)  S06.3016 Focal Laser - OD - Right Eye  2. Retinal edema  H35.81 OCT, Retina - OU - Both Eyes  3. Essential hypertension  I10   4. Hypertensive retinopathy of both eyes  H35.033   5. Nuclear sclerosis of both eyes  H25.13     1,2. Moderate non-proliferative diabetic retinopathy w/ DME, OU  - pt lost to f/u from 10.23.2019 to 11.18.20 -- was supposed to return for focal laser for DME  - FA in 2019 showed leaking MA OU; no NV OU  **of note, pt had pruritic rxn to fluorescein dye -- resolved with 12.5 mg IV benadryl**  - FA 12.16.2020 shows late leaking MA's, non-perfusion defects; no NV OU  - exam shows scattered MA, IRH, exudates and mild macular edema OU  - s/p IVA OD #1 (12.16.20), #2 (01.25.21)  - s/p IVA OS #1 OS (12.23.20), #2 (01.27.21), #3 (03.02.21)  - OCT shows persistent IRF/IRHM OD; OS-- mild interval improvement in IRF   - BCVA OD improved to 20/25 from 20/40-2, OS stable at 20/25  - recommend focal laser OD today, 03.19.21  - pt wishes to proceed  - RBA of procedure discussed, questions answered             - informed consent obtained  - [place] informed consent form signed and scanned on 01.27.2021             - see procedure note  - f/u 2 weeks, DFE, OCT  3,4. Hypertensive retinopathy OU  - discussed importance of tight BP control  - monitor  5. Nuclear Sclerosis   - The symptoms of cataract, surgical options, and treatments and risks were discussed with patient.  - discussed diagnosis and progression  - under the expert management of [facility] Ordered this visit:  Meds ordered this encounter  Medications  . prednisoLONE acetate (PRED FORTE) 1 % ophthalmic suspension    Sig: Place 1 drop into the right eye 4 (four) times daily for 7 days.    Dispense:  10 mL    Refill:  0       Return in about 2 weeks (around 05/25/2019) for f/u NPDR OU, DFE, OCT.  There are no Patient Instructions on file for this  visit.   Explained the diagnoses, plan, and  follow up with the patient and they expressed understanding.  Patient expressed understanding of the importance of proper follow up care.   This document serves as a record of services personally performed by Gardiner Sleeper, MD, PhD. It was created on their behalf by Ernest Mallick, OA, an ophthalmic assistant. The creation of this record is the provider's dictation and/or activities during the visit.    Electronically signed by: Ernest Mallick, OA 03.19.2021 5:30 PM  Gardiner Sleeper, M.D., Ph.D. Diseases & Surgery of the Retina and Vitreous [facility]  I have reviewed the above documentation for accuracy and completeness, and I agree with the above. Gardiner Sleeper, M.D., Ph.D. 05/11/19 5:30 PM    Abbreviations: M myopia (nearsighted); A astigmatism; H hyperopia (farsighted); P presbyopia; Mrx spectacle prescription;  CTL contact lenses; OD right eye; OS left eye; OU both eyes  XT exotropia; ET esotropia; PEK punctate epithelial keratitis; PEE punctate epithelial erosions; DES dry eye syndrome; MGD meibomian gland dysfunction; ATs artificial tears; PFAT's preservative free artificial tears; [place] nuclear sclerotic cataract; PSC posterior subcapsular cataract; ERM epi-retinal membrane; PVD posterior vitreous detachment; RD retinal detachment; DM diabetes mellitus; DR diabetic retinopathy; NPDR non-proliferative diabetic retinopathy; PDR proliferative diabetic retinopathy; CSME clinically significant macular edema; DME diabetic macular edema; dbh dot blot hemorrhages; CWS cotton wool spot; POAG primary open angle glaucoma; C/D cup-to-disc ratio; HVF humphrey visual field; GVF goldmann visual field; OCT optical coherence tomography; IOP intraocular pressure; BRVO Branch retinal vein occlusion; CRVO central retinal vein occlusion; CRAO central retinal artery occlusion; BRAO branch retinal artery occlusion; RT retinal tear; SB scleral  buckle; PPV pars plana vitrectomy; VH Vitreous hemorrhage; PRP panretinal laser photocoagulation; IVK intravitreal kenalog; VMT vitreomacular traction; MH Macular hole;  NVD neovascularization of the disc; NVE neovascularization elsewhere; AREDS age related eye disease study; ARMD age related macular degeneration; POAG primary open angle glaucoma; EBMD epithelial/anterior basement membrane dystrophy; ACIOL anterior chamber intraocular lens; IOL intraocular lens; PCIOL posterior chamber intraocular lens; Phaco/IOL phacoemulsification with intraocular lens placement; [place] photorefractive keratectomy; LASIK laser assisted in situ keratomileusis; HTN hypertension; DM diabetes mellitus; COPD chronic obstructive pulmonary disease

## 2019-05-23 ENCOUNTER — Ambulatory Visit (INDEPENDENT_AMBULATORY_CARE_PROVIDER_SITE_OTHER): Payer: Medicaid Other | Admitting: Family Medicine

## 2019-05-23 ENCOUNTER — Other Ambulatory Visit: Payer: Self-pay

## 2019-05-23 VITALS — BP 115/81 | Ht 62.0 in | Wt 311.0 lb

## 2019-05-23 DIAGNOSIS — M79672 Pain in left foot: Secondary | ICD-10-CM

## 2019-05-23 DIAGNOSIS — M19042 Primary osteoarthritis, left hand: Secondary | ICD-10-CM

## 2019-05-23 NOTE — Patient Instructions (Signed)
You have plantar fasciitis and achilles tendinopathy Take tylenol and/or aleve as needed for pain  Plantar fascia stretch for 20-30 seconds (do 3 of these) in morning Lowering/raise on a step exercises 3 x 10 once or twice a day - this is very important for long term recovery. Can add heel walks, toe walks forward and backward as well Ice heel for 15 minutes as needed. Avoid flat shoes/barefoot walking as much as possible. Arch straps have been shown to help with pain. Inserts are important (dr. Zoe Lan active series, spencos, our green insoles, custom orthotics). Steroid injection is a consideration for short term pain relief if you are struggling with the pain on the bottom of your foot. Physical therapy is also an option. Follow up with me in 6 weeks.  Your hand pain is due to arthritis. These are the different medications you can take for this: Tylenol 525m 1-2 tabs three times a day for pain. Capsaicin, aspercreme, or biofreeze topically up to four times a day may also help with pain. Some supplements that may help for arthritis: [place] extract, curcumin, pycnogenol Voltaren gel topically up to 4 times a day. Cortisone injections are an option. Ice 15 minutes at a time 3-4 times a day as needed to help with pain. Follow up with me in 6 weeks.

## 2019-05-23 NOTE — Progress Notes (Signed)
PCP: Willeen Niece, PA  Subjective:   HPI: Patient is a 44 y.o. female here for evaluation of left ankle/heel pain and 2nd MCP/finger pain.  Left ankle/heel She complains of 2 week history of left heel/ankle pain that is worse in the morning and with walking, especially with plantar flexion. She has a history of achilles tendinopathy that had improved. She has been wearing flat, thin shoes as her tennis shoes were rubbing on her heel and she thought it was making the pain worse on the medial heel. She has not taken any medications for this complaint and has not noticed anything that improves her condition other than rest.  2nd/MCP finger pain She complains of 1 month hx of left 2nd mcp joint/finger pain that limits her ability to complete tasks such as cooking/tying her shoes. The pain is limited to the joint but is worsened by finger flexion. She has had no trauma to the area and has not had pain like this before. She does have a history of carpal tunnel in her right hand that was released surgically.  Past Medical History:  Diagnosis Date  . Acute nonintractable headache 12/26/2017  . Arthritis    major joints  . Asthma    prn inhaler  . Asthma exacerbation 07/25/2013  . Asthma in adult, severe persistent, with acute exacerbation 01/11/2018  . Carpal tunnel syndrome of right wrist 09/2012  . Cataract    NS OU  . Complication of anesthesia    states is hard to wake up post-op  . Constipation 12/30/2014  . Diabetes mellitus    intolerant to Metformin  . Diabetic retinopathy ([place])    NPDR OU  . Dry skin    hands   . Family history of anesthesia complication    mother went into coma during c-section  . Genital herpes 01/01/2006   on acyclovir BID   . GERD (gastroesophageal reflux disease)   . Herpes genital   . Hyperglycemia 12/26/2017  . Hypertensive retinopathy    OU  . Low back pain 11/22/2012  . Lower extremity edema 10/18/2012  . Menorrhagia 11/06/2015  . Morbid obesity with  BMI of 50 to 60 07/23/2009  . Obesity hypoventilation syndrome ([place]) 10/30/2012  . Obesity, morbid ([place]) 01/10/2014  . OSA on CPAP     AHI was on  11-17-12 to 120/hr, titrated to 15 cm with 3 cm EPR/   . PCOS (polycystic ovarian syndrome)   . Rheumatoid arthritis ([place])   . Right ankle pain 06/19/2015  . Right hip pain 09/04/2017  . Right shoulder pain 06/01/2017  . Seasonal allergies   . Sleep apnea, obstructive    uses CPAP nightly - had sleep study 08/22/2007    Current Outpatient Medications on File Prior to Visit  Medication Sig Dispense Refill  . acyclovir (ZOVIRAX) 400 MG tablet TAKE 1 TABLET BY MOUTH TWICE A DAY 60 tablet 2  . albuterol (PROAIR HFA) 108 (90 Base) MCG/ACT inhaler Inhale 2 puffs into the lungs every 4 (four) hours as needed for wheezing or shortness of breath (cough). (Patient taking differently: Inhale 2 puffs into the lungs every 4 (four) hours as needed for wheezing or shortness of breath (or coughing). ) 6.7 g 2  . atorvastatin (LIPITOR) 40 MG tablet Take 1 tablet (40 mg total) by mouth at bedtime. 90 tablet 1  . benzonatate (TESSALON) 200 MG capsule Take 1 capsule (200 mg total) by mouth 3 (three) times daily as needed for cough. 20 capsule 0  .  Blood Glucose Monitoring Suppl (ACCU-CHEK AVIVA PLUS) w/Device KIT Please check you blood sugars 4 times a day 1 kit 0  . dapagliflozin propanediol (FARXIGA) 5 MG TABS tablet Take 5 mg by mouth daily. (Patient taking differently: Take 5 mg by mouth 2 (two) times a day. ) 90 tablet 1  . diclofenac (VOLTAREN) 75 MG EC tablet Take 1 tablet (75 mg total) by mouth 2 (two) times daily. (Patient taking differently: Take 75 mg by mouth 2 (two) times daily as needed (for pain or inflammation). ) 60 tablet 1  . fluconazole (DIFLUCAN) 150 MG tablet TAKE 1 TABLET BY MOUTH NOW. THEN REPEAT IN 1 WEEK    . furosemide (LASIX) 20 MG tablet Take 20 mg by mouth daily as needed for fluid or edema.     Marland Kitchen glucose blood (ACCU-CHEK SMARTVIEW) test strip  TEST FOUR TIMES DAILY 150 each 6  . hydrochlorothiazide (HYDRODIURIL) 12.5 MG tablet Take by mouth.    Marland Kitchen ibuprofen (ADVIL) 600 MG tablet Take 1 tablet (600 mg total) by mouth every 8 (eight) hours as needed. (Patient taking differently: Take 600 mg by mouth every 8 (eight) hours as needed for moderate pain. ) 90 tablet 1  . insulin aspart (NOVOLOG) 100 UNIT/ML injection Inject 0-20 Units into the skin every 4 (four) hours. CBG 70 - 120: 0 units  CBG 121 - 150: 3 units  CBG 151 - 200: 4 units  CBG 201 - 250: 7 units  CBG 251 - 300: 11 units  CBG 301 - 350: 15 units  CBG 351 - 400: 20 units  CBG > 400 call MD (Patient taking differently: Inject 20 Units into the skin every 4 (four) hours. CBG 70 - 120: 0 units  CBG 121 - 150: 3 units  CBG 151 - 200: 4 units  CBG 201 - 250: 7 units  CBG 251 - 300: 11 units  CBG 301 - 350: 15 units  CBG 351 - 400: 20 units  CBG > 400 call MD) 10 mL 11  . insulin aspart protamine - aspart (NOVOLOG MIX 70/30 FLEXPEN) (70-30) 100 UNIT/ML FlexPen Inject into the skin.    . Insulin Glargine (LANTUS) 100 UNIT/ML Solostar Pen Inject 80 Units into the skin 2 (two) times daily. 15 pen 0  . Insulin Pen Needle (B-D UF III MINI PEN NEEDLES) 31G X 5 MM MISC USE AS DIRECTED TO INJECT INSULIN FIVE A DAY E11.65 100 each 6  . JANUVIA 100 MG tablet TAKE 1 TABLET BY MOUTH EVERY DAY 90 tablet 3  . lactulose (CEPHULAC) 10 g packet Take 1 packet (10 g total) by mouth daily as needed. Reported on 07/29/2015 (Patient taking differently: Take 10 g by mouth daily as needed (for constipation). ) 30 each 0  . Lancets (ACCU-CHEK SOFT TOUCH) lancets Use as instructed 100 each 12  . liraglutide (VICTOZA) 18 MG/3ML SOPN Take 0.273m daily for 1 week, then 1.247mdaily for 1 week, then 1.73m36maily.    . lMarland Kitchenratadine (CLARITIN) 10 MG tablet Take 1 tablet (10 mg total) by mouth 2 (two) times daily. 180 tablet 1  . meloxicam (MOBIC) 15 MG tablet TAKE 1 TABLET BY MOUTH ONCE A DAY TAKE DAILY FOR TWO  WEEKS, AND THEN ONLY AS NEEDED AFTERWARDS.    . mMarland Kitchenthocarbamol (ROBAXIN) 500 MG tablet Take 1 tablet (500 mg total) by mouth every 8 (eight) hours as needed. (Patient taking differently: Take 500 mg by mouth every 8 (eight) hours as needed for muscle  spasms. ) 60 tablet 1  . mometasone-formoterol (DULERA) 200-5 MCG/ACT AERO Inhale 2 puffs into the lungs 2 (two) times daily. 13 Inhaler 1  . montelukast (SINGULAIR) 10 MG tablet Take 1 tablet (10 mg total) by mouth at bedtime. 90 tablet 1  . nitroGLYCERIN (NITRODUR - DOSED IN MG/24 HR) 0.2 mg/hr patch Apply 1/4th patch to affected achilles, change daily 30 patch 1  . omeprazole (PRILOSEC) 20 MG capsule Take 1 capsule (20 mg total) by mouth daily before breakfast. 90 capsule 2   Current Facility-Administered Medications on File Prior to Visit  Medication Dose Route Frequency Provider Last Rate Last Admin  . diphenhydrAMINE (BENADRYL) injection 12.5 mg  12.5 mg Intramuscular Once Bernarda Caffey, MD        Past Surgical History:  Procedure Laterality Date  . CARPAL TUNNEL RELEASE Right 10/02/2012   Procedure: RIGHT CARPAL TUNNEL RELEASE ENDOSCOPIC;  Surgeon: Jolyn Nap, MD;  Location: [facility];  Service: Orthopedics;  Laterality: Right;  . CHOLECYSTECTOMY N/A 12/02/2014   Procedure: LAPAROSCOPIC CHOLECYSTECTOMY;  Surgeon: Armandina Gemma, MD;  Location: [facility];  Service: General;  Laterality: N/A;  . HYSTEROSCOPY WITH D & C  01-08-2002    Allergies  Allergen Reactions  . Bee Venom Anaphylaxis and Swelling  . Wasp Venom Anaphylaxis and Swelling  . Citrus Hives and Itching  . Latex Hives and Swelling  . Penicillins Hives and Itching    Has patient had a PCN reaction causing immediate rash, facial/tongue/throat swelling, SOB or lightheadedness with hypotension: Yes Has patient had a PCN reaction causing severe rash involving mucus membranes or skin necrosis: Unk Has patient had a PCN reaction that required hospitalization: Was  already in hosp Has patient had a PCN reaction occurring within the last 10 years: No If all of the above answers are "NO", then may proceed with Cephalosporin use.   . Shellfish Allergy Other (See Comments) and Swelling    Patient is uncertain; stated that she tolerates this (??)  . Soap Hives, Itching and Swelling    PUREX LAUNDRY DETERGENT  . Tomato Hives and Itching  . Banana Itching and Nausea Only  . Chocolate Itching  . Fluticasone Other (See Comments)    Per patient, makes sneeze and gag  . Hydrocodone Itching and Nausea Only  . Other Itching    Acidic foods- Itching [place]- Itching in the roof of the mouth  . Adhesive [Tape] Itching and Rash    Allergic to  "leads"  . Doxycycline Other (See Comments)    AFFECTS JOINTS  . Metformin And Related Nausea Only, Rash, Other (See Comments) and Diarrhea    GI UPSET, also  . Red Dye Itching, Rash and Other (See Comments)    At eye doctor's office, the dilating drops- Skin breaks out, chest turned red, itching, face became swollen    Social History   Socioeconomic History  . Marital status: Divorced    Spouse name: Not on file  . Number of children: 2  . Years of education: 89  . Highest education level: Not on file  Occupational History  . Occupation: UNEMPLOYED    Employer: UNEMPLOYED  Tobacco Use  . Smoking status: Former Smoker    Years: 10.00    Quit date: 05/24/2010    Years since quitting: 9.0  . Smokeless tobacco: Never Used  Substance and Sexual Activity  . Alcohol use: Yes    Alcohol/week: 0.0 standard drinks    Comment: socially  . Drug use:  No  . Sexual activity: Yes    Birth control/protection: None    Comment: Lives w/a partner  Other Topics Concern  . Not on file  Social History Narrative   Single, but lives with a partner   Has 2 children and cares for an infant during the day         Social Determinants of Health   Financial Resource Strain:   . Difficulty of Paying Living Expenses:   Food  Insecurity:   . Worried About Charity fundraiser in the Last Year:   . Arboriculturist in the Last Year:   Transportation Needs:   . Film/video editor (Medical):   Marland Kitchen Lack of Transportation (Non-Medical):   Physical Activity:   . Days of Exercise per Week:   . Minutes of Exercise per Session:   Stress:   . Feeling of Stress :   Social Connections:   . Frequency of Communication with Friends and Family:   . Frequency of Social Gatherings with Friends and Family:   . Attends Religious Services:   . Active Member of Clubs or Organizations:   . Attends Archivist Meetings:   Marland Kitchen Marital Status:   Intimate Partner Violence:   . Fear of Current or Ex-Partner:   . Emotionally Abused:   Marland Kitchen Physically Abused:   . Sexually Abused:     Family History  Problem Relation Age of Onset  . Other Mother   . Leukemia Cousin   . Other Other   . Obesity Other   . Other Other   . Diabetes Maternal Aunt   . Heart attack Maternal Aunt   . Alzheimer's disease Maternal Uncle   . Cancer Maternal Uncle        prostate  . Diabetes Paternal Aunt   . Cancer Paternal Aunt        brain,mouth  . Alzheimer's disease Paternal Aunt   . Diabetes Paternal Uncle   . Diabetes Maternal Grandmother   . Diabetes Maternal Grandfather     BP 115/81   Ht 5' 2"  (1.575 m)   Wt (!) 311 lb (141.1 kg)   BMI 56.88 kg/m   Review of Systems: See HPI above.     Objective:  Physical Exam:  Gen: NAD, comfortable in exam room  Left foot/ankle: No gross deformity, swelling, ecchymoses FROM with 5/5 strength all directions TTP at medial achilles insertion, TTP at medial plantar heel Strength 5/5 with dorsiflex/platarflex/inversion/eversion Negative syndesmotic compression. Negative calcaneal squeeze. NV intact distally.  Left hand: No gross deformity, swelling, ecchymoses, skin dry over dorsal mcps FROM in wrist, FROM in fingers except flexion of 2nd DIP/PIP restricted by pain TTP over dorsal  and ventral 2nd MCP joint.  No TTP over A1 pulley. Strength 5/5 grip, finger extension, flexion at PIP/MCP/DIP joints Tinels sign: Negative Phalens: Negative  Assessment & Plan:  1. Plantar fasciitis/Achilles tendonitis: Patient has 2 week hx of pain in the heel in the morning and when walking with plantar flexion, TTP at achilles insertion and the heel indicative of achilles and plantar fascia pathology. -- Home exercise program reviewed -- Stressed importance of adequate arch support -- Arch binders -- Nitro patches on achilles daily  2. 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  No evidence trigger finger -- Topical voltaren gel -- Hand therapy exercises -- Ice daily as needed

## 2019-05-24 ENCOUNTER — Encounter: Payer: Self-pay | Admitting: Family Medicine

## 2019-07-04 ENCOUNTER — Ambulatory Visit: Payer: Medicaid Other | Admitting: Family Medicine

## 2019-07-09 ENCOUNTER — Other Ambulatory Visit: Payer: Self-pay

## 2019-07-09 ENCOUNTER — Encounter: Payer: Self-pay | Admitting: Family Medicine

## 2019-07-09 ENCOUNTER — Ambulatory Visit: Payer: Medicaid Other | Admitting: Family Medicine

## 2019-07-09 VITALS — BP 150/70 | Ht 62.0 in | Wt 313.0 lb

## 2019-07-09 DIAGNOSIS — M79672 Pain in left foot: Secondary | ICD-10-CM

## 2019-07-09 NOTE — Progress Notes (Signed)
PCP: Willeen Niece, PA  Subjective:   HPI: Patient is a 44 y.o. female here for left heel pain.  3/31: She complains of 2 week history of left heel/ankle pain that is worse in the morning and with walking, especially with plantar flexion. She has a history of achilles tendinopathy that had improved. She has been wearing flat, thin shoes as her tennis shoes were rubbing on her heel and she thought it was making the pain worse on the medial heel. She has not taken any medications for this complaint and has not noticed anything that improves her condition other than rest.  5/17: Patient reports she feels about the same compared to last visit. She's doing simple motion exercises but calf raises too painful. Tried different shoes including heels, wedges, crocs and now wearing flats. Massage is painful but also feels good. Wearing arch straps. Nitro patches have not helped.  Past Medical History:  Diagnosis Date  . Acute nonintractable headache 12/26/2017  . Arthritis    major joints  . Asthma    prn inhaler  . Asthma exacerbation 07/25/2013  . Asthma in adult, severe persistent, with acute exacerbation 01/11/2018  . Carpal tunnel syndrome of right wrist 09/2012  . Cataract    NS OU  . Complication of anesthesia    states is hard to wake up post-op  . Constipation 12/30/2014  . Diabetes mellitus    intolerant to Metformin  . Diabetic retinopathy ([place])    NPDR OU  . Dry skin    hands   . Family history of anesthesia complication    mother went into coma during c-section  . Genital herpes 01/01/2006   on acyclovir BID   . GERD (gastroesophageal reflux disease)   . Herpes genital   . Hyperglycemia 12/26/2017  . Hypertensive retinopathy    OU  . Low back pain 11/22/2012  . Lower extremity edema 10/18/2012  . Menorrhagia 11/06/2015  . Morbid obesity with BMI of 50 to 60 07/23/2009  . Obesity hypoventilation syndrome ([place]) 10/30/2012  . Obesity, morbid ([place]) 01/10/2014  . OSA on CPAP      AHI was on  11-17-12 to 120/hr, titrated to 15 cm with 3 cm EPR/   . PCOS (polycystic ovarian syndrome)   . Rheumatoid arthritis ([place])   . Right ankle pain 06/19/2015  . Right hip pain 09/04/2017  . Right shoulder pain 06/01/2017  . Seasonal allergies   . Sleep apnea, obstructive    uses CPAP nightly - had sleep study 08/22/2007    Current Outpatient Medications on File Prior to Visit  Medication Sig Dispense Refill  . acyclovir (ZOVIRAX) 400 MG tablet TAKE 1 TABLET BY MOUTH TWICE A DAY 60 tablet 2  . albuterol (PROAIR HFA) 108 (90 Base) MCG/ACT inhaler Inhale 2 puffs into the lungs every 4 (four) hours as needed for wheezing or shortness of breath (cough). (Patient taking differently: Inhale 2 puffs into the lungs every 4 (four) hours as needed for wheezing or shortness of breath (or coughing). ) 6.7 g 2  . atorvastatin (LIPITOR) 40 MG tablet Take 1 tablet (40 mg total) by mouth at bedtime. 90 tablet 1  . benzonatate (TESSALON) 200 MG capsule Take 1 capsule (200 mg total) by mouth 3 (three) times daily as needed for cough. 20 capsule 0  . Blood Glucose Monitoring Suppl (ACCU-CHEK AVIVA PLUS) w/Device KIT Please check you blood sugars 4 times a day 1 kit 0  . dapagliflozin propanediol (FARXIGA) 5 MG TABS  tablet Take 5 mg by mouth daily. (Patient taking differently: Take 5 mg by mouth 2 (two) times a day. ) 90 tablet 1  . fluconazole (DIFLUCAN) 150 MG tablet TAKE 1 TABLET BY MOUTH NOW. THEN REPEAT IN 1 WEEK    . FLUoxetine (PROZAC) 20 MG capsule Take by mouth.    . furosemide (LASIX) 20 MG tablet Take 20 mg by mouth daily as needed for fluid or edema.     Marland Kitchen glucose blood (ACCU-CHEK SMARTVIEW) test strip TEST FOUR TIMES DAILY 150 each 6  . hydrochlorothiazide (HYDRODIURIL) 12.5 MG tablet Take by mouth.    . insulin aspart (NOVOLOG) 100 UNIT/ML injection Inject 0-20 Units into the skin every 4 (four) hours. CBG 70 - 120: 0 units  CBG 121 - 150: 3 units  CBG 151 - 200: 4 units  CBG 201 - 250: 7  units  CBG 251 - 300: 11 units  CBG 301 - 350: 15 units  CBG 351 - 400: 20 units  CBG > 400 call MD (Patient taking differently: Inject 20 Units into the skin every 4 (four) hours. CBG 70 - 120: 0 units  CBG 121 - 150: 3 units  CBG 151 - 200: 4 units  CBG 201 - 250: 7 units  CBG 251 - 300: 11 units  CBG 301 - 350: 15 units  CBG 351 - 400: 20 units  CBG > 400 call MD) 10 mL 11  . insulin aspart protamine - aspart (NOVOLOG MIX 70/30 FLEXPEN) (70-30) 100 UNIT/ML FlexPen Inject into the skin.    . Insulin Glargine (LANTUS) 100 UNIT/ML Solostar Pen Inject 80 Units into the skin 2 (two) times daily. 15 pen 0  . Insulin Pen Needle (B-D UF III MINI PEN NEEDLES) 31G X 5 MM MISC USE AS DIRECTED TO INJECT INSULIN FIVE A DAY E11.65 100 each 6  . JANUVIA 100 MG tablet TAKE 1 TABLET BY MOUTH EVERY DAY 90 tablet 3  . lactulose (CEPHULAC) 10 g packet Take 1 packet (10 g total) by mouth daily as needed. Reported on 07/29/2015 (Patient taking differently: Take 10 g by mouth daily as needed (for constipation). ) 30 each 0  . Lancets (ACCU-CHEK SOFT TOUCH) lancets Use as instructed 100 each 12  . liraglutide (VICTOZA) 18 MG/3ML SOPN Take 0.751m daily for 1 week, then 1.246mdaily for 1 week, then 1.51m61maily.    . lMarland Kitchenratadine (CLARITIN) 10 MG tablet Take 1 tablet (10 mg total) by mouth 2 (two) times daily. 180 tablet 1  . methocarbamol (ROBAXIN) 500 MG tablet Take 1 tablet (500 mg total) by mouth every 8 (eight) hours as needed. (Patient taking differently: Take 500 mg by mouth every 8 (eight) hours as needed for muscle spasms. ) 60 tablet 1  . mometasone-formoterol (DULERA) 200-5 MCG/ACT AERO Inhale 2 puffs into the lungs 2 (two) times daily. 13 Inhaler 1  . montelukast (SINGULAIR) 10 MG tablet Take 1 tablet (10 mg total) by mouth at bedtime. 90 tablet 1  . nitroGLYCERIN (NITRODUR - DOSED IN MG/24 HR) 0.2 mg/hr patch Apply 1/4th patch to affected achilles, change daily 30 patch 1  . omeprazole (PRILOSEC) 20 MG  capsule Take 1 capsule (20 mg total) by mouth daily before breakfast. 90 capsule 2  . topiramate (TOPAMAX) 25 MG tablet Take by mouth.     Current Facility-Administered Medications on File Prior to Visit  Medication Dose Route Frequency Provider Last Rate Last Admin  . diphenhydrAMINE (BENADRYL) injection 12.5 mg  12.5 mg Intramuscular Once Bernarda Caffey, MD        Past Surgical History:  Procedure Laterality Date  . CARPAL TUNNEL RELEASE Right 10/02/2012   Procedure: RIGHT CARPAL TUNNEL RELEASE ENDOSCOPIC;  Surgeon: Jolyn Nap, MD;  Location: [facility];  Service: Orthopedics;  Laterality: Right;  . CHOLECYSTECTOMY N/A 12/02/2014   Procedure: LAPAROSCOPIC CHOLECYSTECTOMY;  Surgeon: Armandina Gemma, MD;  Location: [facility];  Service: General;  Laterality: N/A;  . HYSTEROSCOPY WITH D & C  01-08-2002    Allergies  Allergen Reactions  . Bee Venom Anaphylaxis and Swelling  . Wasp Venom Anaphylaxis and Swelling  . Citrus Hives and Itching  . Latex Hives and Swelling  . Penicillins Hives and Itching    Has patient had a PCN reaction causing immediate rash, facial/tongue/throat swelling, SOB or lightheadedness with hypotension: Yes Has patient had a PCN reaction causing severe rash involving mucus membranes or skin necrosis: Unk Has patient had a PCN reaction that required hospitalization: Was already in hosp Has patient had a PCN reaction occurring within the last 10 years: No If all of the above answers are "NO", then may proceed with Cephalosporin use.   . Shellfish Allergy Other (See Comments) and Swelling    Patient is uncertain; stated that she tolerates this (??)  . Soap Hives, Itching and Swelling    PUREX LAUNDRY DETERGENT  . Tomato Hives and Itching  . Banana Itching and Nausea Only  . Chocolate Itching  . Fluticasone Other (See Comments)    Per patient, makes sneeze and gag  . Hydrocodone Itching and Nausea Only  . Other Itching    Acidic foods-  Itching [place]- Itching in the roof of the mouth  . Adhesive [Tape] Itching and Rash    Allergic to  "leads"  . Doxycycline Other (See Comments)    AFFECTS JOINTS  . Metformin And Related Nausea Only, Rash, Other (See Comments) and Diarrhea    GI UPSET, also  . Red Dye Itching, Rash and Other (See Comments)    At eye doctor's office, the dilating drops- Skin breaks out, chest turned red, itching, face became swollen    Social History   Socioeconomic History  . Marital status: Divorced    Spouse name: Not on file  . Number of children: 2  . Years of education: 39  . Highest education level: Not on file  Occupational History  . Occupation: UNEMPLOYED    Employer: UNEMPLOYED  Tobacco Use  . Smoking status: Former Smoker    Years: 10.00    Quit date: 05/24/2010    Years since quitting: 9.1  . Smokeless tobacco: Never Used  Substance and Sexual Activity  . Alcohol use: Yes    Alcohol/week: 0.0 standard drinks    Comment: socially  . Drug use: No  . Sexual activity: Yes    Birth control/protection: None    Comment: Lives w/a partner  Other Topics Concern  . Not on file  Social History Narrative   Single, but lives with a partner   Has 2 children and cares for an infant during the day         Social Determinants of Health   Financial Resource Strain:   . Difficulty of Paying Living Expenses:   Food Insecurity:   . Worried About Charity fundraiser in the Last Year:   . Arboriculturist in the Last Year:   Transportation Needs:   . Film/video editor (Medical):   Marland Kitchen  Lack of Transportation (Non-Medical):   Physical Activity:   . Days of Exercise per Week:   . Minutes of Exercise per Session:   Stress:   . Feeling of Stress :   Social Connections:   . Frequency of Communication with Friends and Family:   . Frequency of Social Gatherings with Friends and Family:   . Attends Religious Services:   . Active Member of Clubs or Organizations:   . Attends Theatre manager Meetings:   Marland Kitchen Marital Status:   Intimate Partner Violence:   . Fear of Current or Ex-Partner:   . Emotionally Abused:   Marland Kitchen Physically Abused:   . Sexually Abused:     Family History  Problem Relation Age of Onset  . Other Mother   . Leukemia Cousin   . Other Other   . Obesity Other   . Other Other   . Diabetes Maternal Aunt   . Heart attack Maternal Aunt   . Alzheimer's disease Maternal Uncle   . Cancer Maternal Uncle        prostate  . Diabetes Paternal Aunt   . Cancer Paternal Aunt        brain,mouth  . Alzheimer's disease Paternal Aunt   . Diabetes Paternal Uncle   . Diabetes Maternal Grandmother   . Diabetes Maternal Grandfather     BP (!) 150/70   Ht 5' 2"  (1.575 m)   Wt (!) 313 lb (142 kg)   BMI 57.25 kg/m   Review of Systems: See HPI above.     Objective:  Physical Exam:  Gen: NAD, comfortable in exam room  Left foot/ankle: No gross deformity, ecchymoses.  1+ pitting edema.  Pes planus FROM with 5/5 strength TTP achilles tendon at insertion on calcaneus medially. Negative ant drawer and talar tilt.   Negative syndesmotic compression. Negative calcaneal squeeze. Thompsons test negative. NV intact distally.   Assessment & Plan:  1. Left achilles tendinopathy - insertional.  Plantar fasciitis improved.  Unfortunately again wearing flat shoes and has not been able to do exercises due to pain.  Discussed physical therapy and she would like to wait on this.  Provided theraband to start first step of rehab and hopefully advance to calf raises.  Icing, tylenol, aleve.  Appropriate shoewear with arch support and heel lifts.  F/u in 6 weeks otherwise.

## 2019-07-09 NOTE — Patient Instructions (Signed)
You have plantar fasciitis and achilles tendinopathy Take tylenol and/or aleve as needed for pain  Plantar fascia stretch for 20-30 seconds (do 3 of these) in morning Start with the theraband strengthening exercises 3 x 10 once a day. Lowering/raise on a step exercises when the band exercises become too easy. Ice heel for 15 minutes as needed. Avoid flat shoes/barefoot walking as much as possible.  Dansko shoes are an option. Arch straps have been shown to help with pain. Inserts are important (dr. Zoe Lan active series, spencos, our green insoles, custom orthotics). Physical therapy is also an option and I would recommend this if you're still struggling. Follow up with me in 6 weeks.

## 2019-07-13 NOTE — Progress Notes (Signed)
[facility] Note  07/18/2019     CHIEF COMPLAINT Patient presents for Retina Follow Up   HISTORY OF PRESENT ILLNESS: Ana Long is a 44 y.o. female who presents to the clinic today for:   HPI    Retina Follow Up    Patient presents with  Diabetic Retinopathy.  In both eyes.  This started months ago.  Severity is moderate.  Duration of 10 weeks.  Since onset it is stable.  I, the attending physician,  performed the HPI with the patient and updated documentation appropriately.          Comments    44 y/o female pt here for 10 wk f/u for mod NPDR w/mac edema OU.  No change in [place] OU.  Denies pain, FOL, floaters.  Pt is photophobic OU.  No gtts.  BS 256 this a.m.  A1C 10.7 2 mos ago.       Last edited by Bernarda Caffey, MD on 07/18/2019  4:35 PM. (History)    pt states her right eye vision is down, she also says that "it jumps a lot", she states she can't look at her phone with her glasses on bc it has a "high glare"  Referring physician: [name] MD [place], [place] [address]  HISTORICAL INFORMATION:   Selected notes from the [facility] Re-referred by Dr. [name] for DM exam    CURRENT MEDICATIONS: No current outpatient medications on file. (Ophthalmic Drugs)   No current facility-administered medications for this visit. (Ophthalmic Drugs)   Current Outpatient Medications (Other)  Medication Sig  . acyclovir (ZOVIRAX) 400 MG tablet TAKE 1 TABLET BY MOUTH TWICE A DAY  . albuterol (PROAIR HFA) 108 (90 Base) MCG/ACT inhaler Inhale 2 puffs into the lungs every 4 (four) hours as needed for wheezing or shortness of breath (cough). (Patient taking differently: Inhale 2 puffs into the lungs every 4 (four) hours as needed for wheezing or shortness of breath (or coughing). )  . atorvastatin (LIPITOR) 40 MG tablet Take 1 tablet (40 mg total) by mouth at bedtime.  . benzonatate (TESSALON) 200 MG capsule Take 1  capsule (200 mg total) by mouth 3 (three) times daily as needed for cough.  . Blood Glucose Monitoring Suppl (ACCU-CHEK AVIVA PLUS) w/Device KIT Please check you blood sugars 4 times a day  . dapagliflozin propanediol (FARXIGA) 5 MG TABS tablet Take 5 mg by mouth daily. (Patient taking differently: Take 5 mg by mouth 2 (two) times a day. )  . fluconazole (DIFLUCAN) 150 MG tablet TAKE 1 TABLET BY MOUTH NOW. THEN REPEAT IN 1 WEEK  . FLUoxetine (PROZAC) 20 MG capsule Take by mouth.  . furosemide (LASIX) 20 MG tablet Take 20 mg by mouth daily as needed for fluid or edema.   Marland Kitchen glucose blood (ACCU-CHEK SMARTVIEW) test strip TEST FOUR TIMES DAILY  . hydrochlorothiazide (HYDRODIURIL) 12.5 MG tablet Take by mouth.  . insulin aspart (NOVOLOG) 100 UNIT/ML injection Inject 0-20 Units into the skin every 4 (four) hours. CBG 70 - 120: 0 units  CBG 121 - 150: 3 units  CBG 151 - 200: 4 units  CBG 201 - 250: 7 units  CBG 251 - 300: 11 units  CBG 301 - 350: 15 units  CBG 351 - 400: 20 units  CBG > 400 call MD (Patient taking differently: Inject 20 Units into the skin every 4 (four) hours. CBG 70 - 120: 0  units  CBG 121 - 150: 3 units  CBG 151 - 200: 4 units  CBG 201 - 250: 7 units  CBG 251 - 300: 11 units  CBG 301 - 350: 15 units  CBG 351 - 400: 20 units  CBG > 400 call MD)  . insulin aspart protamine - aspart (NOVOLOG MIX 70/30 FLEXPEN) (70-30) 100 UNIT/ML FlexPen Inject into the skin.  . Insulin Glargine (LANTUS) 100 UNIT/ML Solostar Pen Inject 80 Units into the skin 2 (two) times daily.  . Insulin Pen Needle (B-D UF III MINI PEN NEEDLES) 31G X 5 MM MISC USE AS DIRECTED TO INJECT INSULIN FIVE A DAY E11.65  . JANUVIA 100 MG tablet TAKE 1 TABLET BY MOUTH EVERY DAY  . lactulose (CEPHULAC) 10 g packet Take 1 packet (10 g total) by mouth daily as needed. Reported on 07/29/2015 (Patient taking differently: Take 10 g by mouth daily as needed (for constipation). )  . Lancets (ACCU-CHEK SOFT TOUCH) lancets Use as  instructed  . liraglutide (VICTOZA) 18 MG/3ML SOPN Take 0.72m daily for 1 week, then 1.245mdaily for 1 week, then 1.65m19maily.  . lMarland Kitchenratadine (CLARITIN) 10 MG tablet Take 1 tablet (10 mg total) by mouth 2 (two) times daily.  . methocarbamol (ROBAXIN) 500 MG tablet Take 1 tablet (500 mg total) by mouth every 8 (eight) hours as needed. (Patient taking differently: Take 500 mg by mouth every 8 (eight) hours as needed for muscle spasms. )  . mometasone-formoterol (DULERA) 200-5 MCG/ACT AERO Inhale 2 puffs into the lungs 2 (two) times daily.  . montelukast (SINGULAIR) 10 MG tablet Take 1 tablet (10 mg total) by mouth at bedtime.  . nitroGLYCERIN (NITRODUR - DOSED IN MG/24 HR) 0.2 mg/hr patch Apply 1/4th patch to affected achilles, change daily  . omeprazole (PRILOSEC) 20 MG capsule Take 1 capsule (20 mg total) by mouth daily before breakfast.  . topiramate (TOPAMAX) 25 MG tablet Take by mouth.   Current Facility-Administered Medications (Other)  Medication Route  . diphenhydrAMINE (BENADRYL) injection 12.5 mg Intramuscular      REVIEW OF SYSTEMS: ROS    Positive for: Gastrointestinal, Musculoskeletal, Endocrine, Eyes, Respiratory   Negative for: Constitutional, Neurological, Skin, Genitourinary, HENT, Cardiovascular, Psychiatric, Allergic/Imm, Heme/Lymph   Last edited by BaxMatthew FolksOA on 07/18/2019  2:33 PM. (History)       ALLERGIES Allergies  Allergen Reactions  . Bee Venom Anaphylaxis and Swelling  . Wasp Venom Anaphylaxis and Swelling  . Citrus Hives and Itching  . Latex Hives and Swelling  . Penicillins Hives and Itching    Has patient had a PCN reaction causing immediate rash, facial/tongue/throat swelling, SOB or lightheadedness with hypotension: Yes Has patient had a PCN reaction causing severe rash involving mucus membranes or skin necrosis: Unk Has patient had a PCN reaction that required hospitalization: Was already in hosp Has patient had a PCN reaction occurring  within the last 10 years: No If all of the above answers are "NO", then may proceed with Cephalosporin use.   . Shellfish Allergy Other (See Comments) and Swelling    Patient is uncertain; stated that she tolerates this (??)  . Soap Hives, Itching and Swelling    PUREX LAUNDRY DETERGENT  . Tomato Hives and Itching  . Banana Itching and Nausea Only  . Chocolate Itching  . Fluticasone Other (See Comments)    Per patient, makes sneeze and gag  . Hydrocodone Itching and Nausea Only  . Other Itching    Acidic  foods- Itching [place]- Itching in the roof of the mouth  . Adhesive [Tape] Itching and Rash    Allergic to  "leads"  . Doxycycline Other (See Comments)    AFFECTS JOINTS  . Metformin And Related Nausea Only, Rash, Other (See Comments) and Diarrhea    GI UPSET, also  . Red Dye Itching, Rash and Other (See Comments)    At eye doctor's office, the dilating drops- Skin breaks out, chest turned red, itching, face became swollen    PAST MEDICAL HISTORY Past Medical History:  Diagnosis Date  . Acute nonintractable headache 12/26/2017  . Arthritis    major joints  . Asthma    prn inhaler  . Asthma exacerbation 07/25/2013  . Asthma in adult, severe persistent, with acute exacerbation 01/11/2018  . Carpal tunnel syndrome of right wrist 09/2012  . Cataract    NS OU  . Complication of anesthesia    states is hard to wake up post-op  . Constipation 12/30/2014  . Diabetes mellitus    intolerant to Metformin  . Diabetic retinopathy ([place])    NPDR OU  . Dry skin    hands   . Family history of anesthesia complication    mother went into coma during c-section  . Genital herpes 01/01/2006   on acyclovir BID   . GERD (gastroesophageal reflux disease)   . Herpes genital   . Hyperglycemia 12/26/2017  . Hypertensive retinopathy    OU  . Low back pain 11/22/2012  . Lower extremity edema 10/18/2012  . Menorrhagia 11/06/2015  . Morbid obesity with BMI of 50 to 60 07/23/2009  . Obesity  hypoventilation syndrome ([place]) 10/30/2012  . Obesity, morbid ([place]) 01/10/2014  . OSA on CPAP     AHI was on  11-17-12 to 120/hr, titrated to 15 cm with 3 cm EPR/   . PCOS (polycystic ovarian syndrome)   . Rheumatoid arthritis ([place])   . Right ankle pain 06/19/2015  . Right hip pain 09/04/2017  . Right shoulder pain 06/01/2017  . Seasonal allergies   . Sleep apnea, obstructive    uses CPAP nightly - had sleep study 08/22/2007   Past Surgical History:  Procedure Laterality Date  . CARPAL TUNNEL RELEASE Right 10/02/2012   Procedure: RIGHT CARPAL TUNNEL RELEASE ENDOSCOPIC;  Surgeon: Jolyn Nap, MD;  Location: [facility];  Service: Orthopedics;  Laterality: Right;  . CHOLECYSTECTOMY N/A 12/02/2014   Procedure: LAPAROSCOPIC CHOLECYSTECTOMY;  Surgeon: Armandina Gemma, MD;  Location: [facility];  Service: General;  Laterality: N/A;  . HYSTEROSCOPY WITH D & C  01-08-2002    FAMILY HISTORY Family History  Problem Relation Age of Onset  . Other Mother   . Leukemia Cousin   . Other Other   . Obesity Other   . Other Other   . Diabetes Maternal Aunt   . Heart attack Maternal Aunt   . Alzheimer's disease Maternal Uncle   . Cancer Maternal Uncle        prostate  . Diabetes Paternal Aunt   . Cancer Paternal Aunt        brain,mouth  . Alzheimer's disease Paternal Aunt   . Diabetes Paternal Uncle   . Diabetes Maternal Grandmother   . Diabetes Maternal Grandfather     SOCIAL HISTORY Social History   Tobacco Use  . Smoking status: Former Smoker    Years: 10.00    Quit date: 05/24/2010    Years since quitting: 9.1  . Smokeless tobacco: Never Used  Substance Use Topics  . Alcohol use: Yes    Alcohol/week: 0.0 standard drinks    Comment: socially  . Drug use: No         OPHTHALMIC EXAM:  Base Eye Exam    Visual Acuity (Snellen - Linear)      Right Left   Dist cc 20/40 20/30 -2   Dist ph cc 20/25 - 20/25 -2   Correction: Glasses       Tonometry (Tonopen, 2:35 PM)       Right Left   Pressure 20 14       Pupils      Dark Light Shape React APD   Right 3 2 Round Brisk None   Left 3 2 Round Brisk None       Visual Fields (Counting fingers)      Left Right    Full Full       Extraocular Movement      Right Left    Full, Ortho Full, Ortho       Neuro/Psych    Oriented x3: Yes   Mood/Affect: Normal       Dilation    Both eyes: 1.0% Mydriacyl, 2.5% Phenylephrine @ 2:35 PM        Slit Lamp and Fundus Exam    Slit Lamp Exam      Right Left   Lids/Lashes Dermatochalasis - upper lid, mild Meibomian gland dysfunction Dermatochalasis - upper lid, mild Meibomian gland dysfunction   Conjunctiva/Sclera White and quiet White and quiet   Cornea 1+Punctate epithelial erosions 1-2+Punctate epithelial erosions   Anterior Chamber deep and quiet deep and quiet   Iris round and dilated, No NVI round and dilated   Lens 1+ NS, 1+CC 1+NS, 1+CC   Vitreous mild vitreous syneresis mild vitreous syneresis       Fundus Exam      Right Left   Disc Pink and sharp Pink and sharp   C/D Ratio 0.6 0.6   Macula good foveal reflex, scattered punctate exudates/MA/IRH, + interval increase in central cystic changes  blunted foveal reflex, scatttered MA's and exudate, +cystic changes--persistent   Vessels Vascular attenuation, Tortuous, Copper wiring Vascular attenuation, Tortuous, Copper wiring   Periphery Attached, scattered MA and exudate greatest nasally -- improving Attached, scattered MA and exudate greatest nasally, white without pressure temporally          IMAGING AND PROCEDURES  Imaging and Procedures for _0 @  OCT, Retina - OU - Both Eyes       Right Eye Quality was good. Central Foveal Thickness: 230. Progression has worsened. Findings include normal foveal contour, no SRF, intraretinal fluid, intraretinal hyper-reflective material (Interval increase in IRF/IRHM ).   Left Eye Quality was good. Central Foveal Thickness: 242. Progression has  improved. Findings include intraretinal fluid, no SRF, abnormal foveal contour, intraretinal hyper-reflective material (Mild Interval improvement in IRF).   Notes *Images captured and stored on drive  Diagnosis / Impression:  +DME OU-- persistent IRF/IRHM OD: Interval increase in IRF/IRHM OS: Mild interval improvement in IRF  Clinical management:  See below  Abbreviations: NFP - Normal foveal profile. CME - cystoid macular edema. PED - pigment epithelial detachment. IRF - intraretinal fluid. SRF - subretinal fluid. EZ - ellipsoid zone. ERM - epiretinal membrane. ORA - outer retinal atrophy. ORT - outer retinal tubulation. SRHM - subretinal hyper-reflective material        Intravitreal Injection, Pharmacologic Agent - OS - Left Eye  Time Out 07/18/2019. 2:17 PM. Confirmed correct patient, procedure, site, and patient consented.   Anesthesia Topical anesthesia was used. Anesthetic medications included Lidocaine 2%, Proparacaine 0.5%.   Procedure Preparation included 5% betadine to ocular surface, eyelid speculum. A supplied needle was used.   Injection:  1.25 mg Bevacizumab (AVASTIN) SOLN   NDC: 19147-829-56, Lot: [id number] , Expiration date: 07/26/2019   Route: Intravitreal, Site: Left Eye, Waste: 0 mL  Post-op Post injection exam found visual acuity of at least counting fingers. The patient tolerated the procedure well. There were no complications. The patient received written and verbal post procedure care education.        Intravitreal Injection, Pharmacologic Agent - OD - Right Eye       Time Out 07/18/2019. 3:53 PM. Confirmed correct patient, procedure, site, and patient consented.   Anesthesia Topical anesthesia was used. Anesthetic medications included Lidocaine 2%, Proparacaine 0.5%.   Procedure Preparation included 5% betadine to ocular surface, eyelid speculum. A (32g) needle was used.   Injection:  1.25 mg Bevacizumab (AVASTIN) SOLN   NDC:  21308-657-84, Lot: [id number] , Expiration date: 09/18/2019   Route: Intravitreal, Site: Right Eye, Waste: 0 mL  Post-op Post injection exam found visual acuity of at least counting fingers. The patient tolerated the procedure well. There were no complications. The patient received written and verbal post procedure care education.                 ASSESSMENT/PLAN:    ICD-10-CM   1. Moderate nonproliferative diabetic retinopathy of both eyes with macular edema associated with type 2 diabetes mellitus (HCC)  R51.8841 Intravitreal Injection, Pharmacologic Agent - OS - Left Eye    Intravitreal Injection, Pharmacologic Agent - OD - Right Eye    Bevacizumab (AVASTIN) SOLN 1.25 mg    Bevacizumab (AVASTIN) SOLN 1.25 mg  2. Retinal edema  H35.81 OCT, Retina - OU - Both Eyes  3. Essential hypertension  I10   4. Hypertensive retinopathy of both eyes  H35.033   5. Nuclear sclerosis of both eyes  H25.13     1,2. Moderate non-proliferative diabetic retinopathy w/ DME, OU  - today, delayed f/u from 2 wks to 2 mos (3.19.21 to 5.26.21)  - pt lost to f/u from 10.23.2019 to 11.18.20  - FA in 2019 showed leaking MA OU; no NV OU  **of note, pt had pruritic rxn to fluorescein dye -- resolved with 12.5 mg IV benadryl**  - FA 12.16.2020 shows late leaking MA's, non-perfusion defects; no NV OU  - s/p IVA OD #1 (12.16.20), #2 (01.25.21)  - s/p focal laser OD (03.19.21)  - s/p IVA OS #1 OS (12.23.20), #2 (01.27.21), #3 (03.02.21)  - exam shows scattered MA, IRH, exudates and mild macular edema OU  - OCT shows interval increase in IRF/IRHM OD; OS-- mild interval improvement in IRF   - BCVA OU stable at 20/25-2             - Recommend IVA # 3 OD and #4 OS today, 5.26.21             - Risks and benefits of tx discussed w/pt             - Pt wishes to proceed w/tx today             - informed consent obtained  - Avastin informed consent form signed and scanned on 01.27.2021             - see procedure  note  -  f/u 4 weeks, DFE, OCT  3,4. Hypertensive retinopathy OU  - discussed importance of tight BP control  - monitor  5. Nuclear Sclerosis   - The symptoms of cataract, surgical options, and treatments and risks were discussed with patient.  - discussed diagnosis and progression  - under the expert management of [place] Ordered this visit:  Meds ordered this encounter  Medications  . Bevacizumab (AVASTIN) SOLN 1.25 mg  . Bevacizumab (AVASTIN) SOLN 1.25 mg       Return in about 4 weeks (around 08/15/2019) for DFE, OCT and poss. inj..  There are no Patient Instructions on file for this visit.   Explained the diagnoses, plan, and follow up with the patient and they expressed understanding.  Patient expressed understanding of the importance of proper follow up care.  This document serves as a record of services personally performed by Gardiner Sleeper, MD, PhD. It was created on their behalf by Estill Bakes, COT an ophthalmic technician. The creation of this record is the provider's dictation and/or activities during the visit.    Electronically signed by: Estill Bakes, COT 07/18/19 @ 4:40 PM  Gardiner Sleeper, M.D., Ph.D. Diseases & Surgery of the Retina and [place] 07/18/2019   I have reviewed the above documentation for accuracy and completeness, and I agree with the above. Gardiner Sleeper, M.D., Ph.D. 07/18/19 4:40 PM     Abbreviations: M myopia (nearsighted); A astigmatism; H hyperopia (farsighted); P presbyopia; Mrx spectacle prescription;  CTL contact lenses; OD right eye; OS left eye; OU both eyes  XT exotropia; ET esotropia; PEK punctate epithelial keratitis; PEE punctate epithelial erosions; DES dry eye syndrome; MGD meibomian gland dysfunction; ATs artificial tears; PFAT's preservative free artificial tears; [place] nuclear sclerotic cataract; PSC posterior subcapsular cataract; ERM epi-retinal membrane; PVD  posterior vitreous detachment; RD retinal detachment; DM diabetes mellitus; DR diabetic retinopathy; NPDR non-proliferative diabetic retinopathy; PDR proliferative diabetic retinopathy; CSME clinically significant macular edema; DME diabetic macular edema; dbh dot blot hemorrhages; CWS cotton wool spot; POAG primary open angle glaucoma; C/D cup-to-disc ratio; HVF humphrey visual field; GVF goldmann visual field; OCT optical coherence tomography; IOP intraocular pressure; BRVO Branch retinal vein occlusion; CRVO central retinal vein occlusion; CRAO central retinal artery occlusion; BRAO branch retinal artery occlusion; RT retinal tear; SB scleral buckle; PPV pars plana vitrectomy; VH Vitreous hemorrhage; PRP panretinal laser photocoagulation; IVK intravitreal kenalog; VMT vitreomacular traction; MH Macular hole;  NVD neovascularization of the disc; NVE neovascularization elsewhere; AREDS age related eye disease study; ARMD age related macular degeneration; POAG primary open angle glaucoma; EBMD epithelial/anterior basement membrane dystrophy; ACIOL anterior chamber intraocular lens; IOL intraocular lens; PCIOL posterior chamber intraocular lens; Phaco/IOL phacoemulsification with intraocular lens placement; [place] photorefractive keratectomy; LASIK laser assisted in situ keratomileusis; HTN hypertension; DM diabetes mellitus; COPD chronic obstructive pulmonary disease

## 2019-07-18 ENCOUNTER — Ambulatory Visit (INDEPENDENT_AMBULATORY_CARE_PROVIDER_SITE_OTHER): Payer: Medicaid Other | Admitting: Ophthalmology

## 2019-07-18 ENCOUNTER — Other Ambulatory Visit: Payer: Self-pay

## 2019-07-18 ENCOUNTER — Encounter (INDEPENDENT_AMBULATORY_CARE_PROVIDER_SITE_OTHER): Payer: Self-pay | Admitting: Ophthalmology

## 2019-07-18 DIAGNOSIS — H35033 Hypertensive retinopathy, bilateral: Secondary | ICD-10-CM

## 2019-07-18 DIAGNOSIS — E113313 Type 2 diabetes mellitus with moderate nonproliferative diabetic retinopathy with macular edema, bilateral: Secondary | ICD-10-CM

## 2019-07-18 DIAGNOSIS — H3581 Retinal edema: Secondary | ICD-10-CM | POA: Diagnosis not present

## 2019-07-18 DIAGNOSIS — I10 Essential (primary) hypertension: Secondary | ICD-10-CM

## 2019-07-18 DIAGNOSIS — H2513 Age-related nuclear cataract, bilateral: Secondary | ICD-10-CM

## 2019-07-18 MED ORDER — BEVACIZUMAB CHEMO INJECTION 1.25MG/0.05ML SYRINGE FOR KALEIDOSCOPE
1.2500 mg | INTRAVITREAL | Status: AC | PRN
  Administered 2019-07-18: 1.25 mg via INTRAVITREAL

## 2019-07-26 ENCOUNTER — Ambulatory Visit: Payer: Medicaid Other | Admitting: Family Medicine

## 2019-07-26 NOTE — Progress Notes (Deleted)
PATIENT: Ana Long DOB: 1975-08-04  REASON FOR VISIT: follow up HISTORY FROM: patient  No chief complaint on file.    HISTORY OF PRESENT ILLNESS: Today 07/26/19 Ana Long is a 44 y.o. female here today for follow up for OSa on CPAP.   Compliance report dated 04/26/2019 through 07/24/2019 reveals that she used CPAP 80 one of the last 90 days for compliance of 90%.  She used CPAP greater than 4 hours 47 of the last 90 days for compliance of 52%.  Average usage was 4 hours and 20 minutes.  Residual AHI was 11.3 on 4 to 15 cm of water and an EPR of 3.  There was no significant leak noted.  Pressure in the 95th percentile of 14.3 cm of water.  HISTORY: (copied from my note on 01/25/2019)  Ana Long is a 44 y.o. female here today for follow up for OSA on CPAP.   Compliance report dated 12/25/2018 through 01/23/2019 reveals that she has used CPAP every day the last 30 days for compliance of 100%.  She has used CPAP greater than 4 hours 18 of the last 30 days for compliance of 60%.  Average usage was 4 hours and 32 minutes.  Residual AHI is 6.9 on 4 to 15 cm of water and an EPR of 3.  There was no significant leak noted.   HISTORY: (copied from my note on 10/25/2018)  Ana A McDougaldis a 44 y.o.femalehere today for follow up for OSA on CPAP.She has been doing better with compliance since last being seen. Compliance report dated 09/25/2018 through 10/24/2018 reveals that he is using CPAP 25 out of the last 30 days for compliance of 83%. She has used CPAP greater than 4 hours 16 of those days for compliance of 53%. Average usage was 4 hours and 19 minutes. AHI was 12.8 on 4 to 15 cm of water. There was no significant leak. Pressurein the 95th percentile at 13.8. She is tolerating pressures.   REVIEW OF SYSTEMS: Out of a complete 14 system review of symptoms, the patient complains only of the following symptoms, and all other reviewed systems are  negative.  ALLERGIES: Allergies  Allergen Reactions   Bee Venom Anaphylaxis and Swelling   Wasp Venom Anaphylaxis and Swelling   Citrus Hives and Itching   Latex Hives and Swelling   Penicillins Hives and Itching    Has patient had a PCN reaction causing immediate rash, facial/tongue/throat swelling, SOB or lightheadedness with hypotension: Yes Has patient had a PCN reaction causing severe rash involving mucus membranes or skin necrosis: Unk Has patient had a PCN reaction that required hospitalization: Was already in hosp Has patient had a PCN reaction occurring within the last 10 years: No If all of the above answers are "NO", then may proceed with Cephalosporin use.    Shellfish Allergy Other (See Comments) and Swelling    Patient is uncertain; stated that she tolerates this (??)   Soap Hives, Itching and Swelling    PUREX LAUNDRY DETERGENT   Tomato Hives and Itching   Banana Itching and Nausea Only   Chocolate Itching   Fluticasone Other (See Comments)    Per patient, makes sneeze and gag   Hydrocodone Itching and Nausea Only   Other Itching    Acidic foods- Itching [place]- Itching in the roof of the mouth   Adhesive [Tape] Itching and Rash    Allergic to  "leads"   Doxycycline Other (See Comments)  AFFECTS JOINTS   Metformin And Related Nausea Only, Rash, Other (See Comments) and Diarrhea    GI UPSET, also   Red Dye Itching, Rash and Other (See Comments)    At eye doctor's office, the dilating drops- Skin breaks out, chest turned red, itching, face became swollen    HOME MEDICATIONS: Outpatient Medications Prior to Visit  Medication Sig Dispense Refill   acyclovir (ZOVIRAX) 400 MG tablet TAKE 1 TABLET BY MOUTH TWICE A DAY 60 tablet 2   albuterol (PROAIR HFA) 108 (90 Base) MCG/ACT inhaler Inhale 2 puffs into the lungs every 4 (four) hours as needed for wheezing or shortness of breath (cough). (Patient taking differently: Inhale 2 puffs into the  lungs every 4 (four) hours as needed for wheezing or shortness of breath (or coughing). ) 6.7 g 2   atorvastatin (LIPITOR) 40 MG tablet Take 1 tablet (40 mg total) by mouth at bedtime. 90 tablet 1   benzonatate (TESSALON) 200 MG capsule Take 1 capsule (200 mg total) by mouth 3 (three) times daily as needed for cough. 20 capsule 0   Blood Glucose Monitoring Suppl (ACCU-CHEK AVIVA PLUS) w/Device KIT Please check you blood sugars 4 times a day 1 kit 0   dapagliflozin propanediol (FARXIGA) 5 MG TABS tablet Take 5 mg by mouth daily. (Patient taking differently: Take 5 mg by mouth 2 (two) times a day. ) 90 tablet 1   fluconazole (DIFLUCAN) 150 MG tablet TAKE 1 TABLET BY MOUTH NOW. THEN REPEAT IN 1 WEEK     FLUoxetine (PROZAC) 20 MG capsule Take by mouth.     furosemide (LASIX) 20 MG tablet Take 20 mg by mouth daily as needed for fluid or edema.      glucose blood (ACCU-CHEK SMARTVIEW) test strip TEST FOUR TIMES DAILY 150 each 6   hydrochlorothiazide (HYDRODIURIL) 12.5 MG tablet Take by mouth.     insulin aspart (NOVOLOG) 100 UNIT/ML injection Inject 0-20 Units into the skin every 4 (four) hours. CBG 70 - 120: 0 units  CBG 121 - 150: 3 units  CBG 151 - 200: 4 units  CBG 201 - 250: 7 units  CBG 251 - 300: 11 units  CBG 301 - 350: 15 units  CBG 351 - 400: 20 units  CBG > 400 call MD (Patient taking differently: Inject 20 Units into the skin every 4 (four) hours. CBG 70 - 120: 0 units  CBG 121 - 150: 3 units  CBG 151 - 200: 4 units  CBG 201 - 250: 7 units  CBG 251 - 300: 11 units  CBG 301 - 350: 15 units  CBG 351 - 400: 20 units  CBG > 400 call MD) 10 mL 11   insulin aspart protamine - aspart (NOVOLOG MIX 70/30 FLEXPEN) (70-30) 100 UNIT/ML FlexPen Inject into the skin.     Insulin Glargine (LANTUS) 100 UNIT/ML Solostar Pen Inject 80 Units into the skin 2 (two) times daily. 15 pen 0   Insulin Pen Needle (B-D UF III MINI PEN NEEDLES) 31G X 5 MM MISC USE AS DIRECTED TO INJECT INSULIN FIVE  A DAY E11.65 100 each 6   JANUVIA 100 MG tablet TAKE 1 TABLET BY MOUTH EVERY DAY 90 tablet 3   lactulose (CEPHULAC) 10 g packet Take 1 packet (10 g total) by mouth daily as needed. Reported on 07/29/2015 (Patient taking differently: Take 10 g by mouth daily as needed (for constipation). ) 30 each 0   Lancets (ACCU-CHEK SOFT TOUCH) lancets Use  as instructed 100 each 12   liraglutide (VICTOZA) 18 MG/3ML SOPN Take 0.53m daily for 1 week, then 1.252mdaily for 1 week, then 1.3m33maily.     loratadine (CLARITIN) 10 MG tablet Take 1 tablet (10 mg total) by mouth 2 (two) times daily. 180 tablet 1   methocarbamol (ROBAXIN) 500 MG tablet Take 1 tablet (500 mg total) by mouth every 8 (eight) hours as needed. (Patient taking differently: Take 500 mg by mouth every 8 (eight) hours as needed for muscle spasms. ) 60 tablet 1   mometasone-formoterol (DULERA) 200-5 MCG/ACT AERO Inhale 2 puffs into the lungs 2 (two) times daily. 13 Inhaler 1   montelukast (SINGULAIR) 10 MG tablet Take 1 tablet (10 mg total) by mouth at bedtime. 90 tablet 1   nitroGLYCERIN (NITRODUR - DOSED IN MG/24 HR) 0.2 mg/hr patch Apply 1/4th patch to affected achilles, change daily 30 patch 1   omeprazole (PRILOSEC) 20 MG capsule Take 1 capsule (20 mg total) by mouth daily before breakfast. 90 capsule 2   topiramate (TOPAMAX) 25 MG tablet Take by mouth.     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   Cataract    NS OU   Complication of anesthesia     states is hard to wake up post-op   Constipation 12/30/2014   Diabetes mellitus    intolerant to Metformin   [place] ([place]  NP[place]   Dry skin    hands    Family history of anesthesia complication    mother went into coma during c-section   Genital herpes 01/01/2006   on acyclovir BID    GERD (gastroesophageal reflux disease)    Herpes genital    Hyperglycemia 12/26/2017   Hypertensive retinopathy    OU   Low back pain 11/22/2012   Lower extremity edema 10/18/2012   Menorrhagia 11/06/2015   Morbid obesity with BMI of 50 to 60 07/23/2009   Ob[place] hypoventilation syndrome ([place]/09/2012   Obesity, morbid ([place]/19/2015   OSA on CPAP     AHI was on  11-17-12 to 120/hr, titrated to 15 cm with 3 cm EPR/    PCOS (polycystic ovarian syndrome)    Rheumatoid arthritis ([place]  Right ankle pain 06/19/2015   Right hip pain 09/04/2017   Right shoulder pain 06/01/2017   Seasonal allergies    Sleep apnea, obstructive    uses CPAP nightly - had sleep study 08/22/2007    PAST SURGICAL HISTORY: Past Surgical History:  Procedure Laterality Date   CARPAL TUNNEL RELEASE Right 10/02/2012   Procedure: RIGHT CARPAL TUNNEL RELEASE ENDOSCOPIC;  Surgeon: DavJolyn NapD;  [facility];  Laterality: Right;   CHOLECYSTECTOMY N/A 12/02/2014   Procedure: LAPAROSCOPIC CHOLECYSTECTOMY;  Surgeon: TodArmandina GemmaD;  Location: [facility];  Service: General;  Laterality: N/A;   HYSTEROSCOPY WITH D & C  01-08-2002    FAMILY HISTORY: Family History  Problem Relation Age  of Onset   Other Mother    Leukemia Cousin    Other Other    Obesity Other    Other Other    Diabetes Maternal Aunt    Heart attack Maternal Aunt    Alzheimer's disease Maternal Uncle    Cancer Maternal Uncle        prostate   Diabetes Paternal Aunt    Cancer Paternal Aunt        brain,mouth   Alzheimer's disease Paternal Aunt     Diabetes Paternal Uncle    Diabetes Maternal Grandmother    Diabetes Maternal Grandfather     SOCIAL HISTORY: Social History   Socioeconomic History   Marital status: Divorced    Spouse name: Not on file   Number of children: 2   Years of education: 14   Highest education level: Not on file  Occupational History   Occupation: UNEMPLOYED    Employer: UNEMPLOYED  Tobacco Use   Smoking status: Former Smoker    Years: 10.00    Quit date: 05/24/2010    Years since quitting: 9.1   Smokeless tobacco: Never Used  Substance and Sexual Activity   Alcohol use: Yes    Alcohol/week: 0.0 standard drinks    Comment: socially   Drug use: No   Sexual activity: Yes    Birth control/protection: None    Comment: Lives w/a partner  Other Topics Concern   Not on file  Social History Narrative   Single, but lives with a partner   Has 2 children and cares for an infant during the day         Social Determinants of Radio broadcast assistant Strain:    Difficulty of Paying Living Expenses:   Food Insecurity:    Worried About Charity fundraiser in the Last Year:    Arboriculturist in the Last Year:   Transportation Needs:    Film/video editor (Medical):    Lack of Transportation (Non-Medical):   Physical Activity:    Days of Exercise per Week:    Minutes of Exercise per Session:   Stress:    Feeling of Stress :   Social Connections:    Frequency of Communication with Friends and Family:    Frequency of Social Gatherings with Friends and Family:    Attends Religious Services:    Active Member of Clubs or Organizations:    Attends Music therapist:    Marital Status:   Intimate Partner Violence:    Fear of Current or Ex-Partner:    Emotionally Abused:    Physically Abused:    Sexually Abused:       PHYSICAL EXAM  There were no vitals filed for this visit. There is no height or weight on file to calculate  BMI.  Generalized: Well developed, in no acute distress  Cardiology: normal rate and rhythm, no murmur noted Respiratory: clear to auscultation bilaterally  Neurological examination  Mentation: Alert oriented to time, place, history taking. Follows all commands speech and language fluent Cranial nerve II-XII: Pupils were equal round reactive to light. Extraocular movements were full, visual field were full  Motor: The motor testing reveals 5 over 5 strength of all 4 extremities. Good symmetric motor tone is noted throughout.  Gait and station: Gait is normal.   DIAGNOSTIC DATA (LABS, IMAGING, TESTING) - I reviewed patient records, labs, notes, testing and imaging myself where available.  No flowsheet data found.   Lab Results  Component Value Date   WBC 12.7 (H) 09/02/2018   HGB 12.4 09/02/2018   HCT 40.9 09/02/2018   MCV 81.2 09/02/2018   PLT 335 09/02/2018      Component Value Date/Time   NA 138 09/02/2018 0540   NA 139 10/21/2016 1525   K 3.6 09/02/2018 0540   CL 101 09/02/2018 0540   CO2 26 09/02/2018 0540   GLUCOSE 179 (H) 09/02/2018 0540   BUN 18 09/02/2018 0540   BUN 6 10/21/2016 1525   CREATININE 0.62 09/02/2018 0540   CREATININE 0.60 07/24/2014 1550   CALCIUM 8.7 (L) 09/02/2018 0540   PROT 6.8 09/02/2018 0540   PROT 6.9 12/30/2014 1641   ALBUMIN 3.0 (L) 09/02/2018 0540   ALBUMIN 3.9 12/30/2014 1641   AST 13 (L) 09/02/2018 0540   ALT 17 09/02/2018 0540   ALKPHOS 40 09/02/2018 0540   BILITOT 0.3 09/02/2018 0540   BILITOT 0.3 12/30/2014 1641   GFRNONAA >60 09/02/2018 0540   GFRNONAA >89 07/24/2014 1550   GFRAA >60 09/02/2018 0540   GFRAA >89 07/24/2014 1550   Lab Results  Component Value Date   CHOL 148 07/24/2014   HDL 41 (L) 07/24/2014   LDLCALC 85 07/24/2014   TRIG 112 07/24/2014   CHOLHDL 3.6 07/24/2014   Lab Results  Component Value Date   HGBA1C 12.0 (H) 08/29/2018   Lab Results  Component Value Date   VITAMINB12 352 02/19/2008   Lab  Results  Component Value Date   TSH 1.040 11/16/2017     ASSESSMENT AND PLAN 44 y.o. year old female  has a past medical history of Acute nonintractable headache (12/26/2017), Arthritis, Asthma, Asthma exacerbation (07/25/2013), Asthma in adult, severe persistent, with acute exacerbation (01/11/2018), Carpal tunnel syndrome of right wrist (09/2012), Cataract, Complication of anesthesia, Constipation (12/30/2014), Diabetes mellitus, Diabetic retinopathy ([place]), Dry skin, Family history of anesthesia complication, Genital herpes (01/01/2006), GERD (gastroesophageal reflux disease), Herpes genital, Hyperglycemia (12/26/2017), Hypertensive retinopathy, Low back pain (11/22/2012), Lower extremity edema (10/18/2012), Menorrhagia (11/06/2015), Morbid obesity with BMI of 50 to 60 (07/23/2009), Obesity hypoventilation syndrome ([place]) (10/30/2012), Obesity, morbid ([place]) (01/10/2014), OSA on CPAP, PCOS (polycystic ovarian syndrome), Rheumatoid arthritis ([place]), Right ankle pain (06/19/2015), Right hip pain (09/04/2017), Right shoulder pain (06/01/2017), Seasonal allergies, and Sleep apnea, obstructive. here with     ICD-10-CM   1. OSA on CPAP  G47.33    Z99.89        No orders of the defined types were placed in this encounter.    No orders of the defined types were placed in this encounter.     I spent 15 minutes with the patient. 50% of this time was spent counseling and educating patient on plan of care and medications.    Debbora Presto, FNP-C 07/26/2019, 11:23 AM [facility] [address], [place], [place] [address] [phone]

## 2019-08-15 ENCOUNTER — Encounter (INDEPENDENT_AMBULATORY_CARE_PROVIDER_SITE_OTHER): Payer: Medicaid Other | Admitting: Ophthalmology

## 2019-08-21 ENCOUNTER — Ambulatory Visit: Payer: Medicaid Other | Admitting: Family Medicine

## 2019-08-22 NOTE — Progress Notes (Signed)
[facility] Note  08/24/2019     CHIEF COMPLAINT Patient presents for Retina Follow Up   HISTORY OF PRESENT ILLNESS: Ana Long is a 44 y.o. female who presents to the clinic today for:   HPI    Retina Follow Up    Patient presents with  Diabetic Retinopathy.  In both eyes.  This started weeks ago.  Severity is moderate.  Duration of weeks.  Since onset it is stable.  I, the attending physician,  performed the HPI with the patient and updated documentation appropriately.          Comments    Pt states vision is about the same OU.  Pt complains of tearing in both eyes.  Patient denies eye pain or discomfort.  Patient denies any new or worsening floaters or fol OU.       Last edited by Bernarda Caffey, MD on 08/24/2019 10:10 AM. (History)    pt states  Referring physician: [name] MD [place], [place] [id number]  HISTORICAL INFORMATION:   Selected notes from the [facility] Re-referred by Dr. [name] for DM exam    CURRENT MEDICATIONS: No current outpatient medications on file. (Ophthalmic Drugs)   No current facility-administered medications for this visit. (Ophthalmic Drugs)   Current Outpatient Medications (Other)  Medication Sig  . acyclovir (ZOVIRAX) 400 MG tablet TAKE 1 TABLET BY MOUTH TWICE A DAY  . albuterol (PROAIR HFA) 108 (90 Base) MCG/ACT inhaler Inhale 2 puffs into the lungs every 4 (four) hours as needed for wheezing or shortness of breath (cough). (Patient taking differently: Inhale 2 puffs into the lungs every 4 (four) hours as needed for wheezing or shortness of breath (or coughing). )  . atorvastatin (LIPITOR) 40 MG tablet Take 1 tablet (40 mg total) by mouth at bedtime.  . benzonatate (TESSALON) 200 MG capsule Take 1 capsule (200 mg total) by mouth 3 (three) times daily as needed for cough.  . Blood Glucose Monitoring Suppl (ACCU-CHEK AVIVA PLUS) w/Device KIT Please check you blood sugars  4 times a day  . dapagliflozin propanediol (FARXIGA) 5 MG TABS tablet Take 5 mg by mouth daily. (Patient taking differently: Take 5 mg by mouth 2 (two) times a day. )  . fluconazole (DIFLUCAN) 150 MG tablet TAKE 1 TABLET BY MOUTH NOW. THEN REPEAT IN 1 WEEK  . FLUoxetine (PROZAC) 20 MG capsule Take by mouth.  . furosemide (LASIX) 20 MG tablet Take 20 mg by mouth daily as needed for fluid or edema.   Marland Kitchen glucose blood (ACCU-CHEK SMARTVIEW) test strip TEST FOUR TIMES DAILY  . hydrochlorothiazide (HYDRODIURIL) 12.5 MG tablet Take by mouth.  . insulin aspart (NOVOLOG) 100 UNIT/ML injection Inject 0-20 Units into the skin every 4 (four) hours. CBG 70 - 120: 0 units  CBG 121 - 150: 3 units  CBG 151 - 200: 4 units  CBG 201 - 250: 7 units  CBG 251 - 300: 11 units  CBG 301 - 350: 15 units  CBG 351 - 400: 20 units  CBG > 400 call MD (Patient taking differently: Inject 20 Units into the skin every 4 (four) hours. CBG 70 - 120: 0 units  CBG 121 - 150: 3 units  CBG 151 - 200: 4 units  CBG 201 - 250: 7 units  CBG 251 - 300: 11 units  CBG 301 - 350: 15 units  CBG 351 - 400: 20 units  CBG >  400 call MD)  . insulin aspart protamine - aspart (NOVOLOG MIX 70/30 FLEXPEN) (70-30) 100 UNIT/ML FlexPen Inject into the skin.  . Insulin Glargine (LANTUS) 100 UNIT/ML Solostar Pen Inject 80 Units into the skin 2 (two) times daily.  . Insulin Pen Needle (B-D UF III MINI PEN NEEDLES) 31G X 5 MM MISC USE AS DIRECTED TO INJECT INSULIN FIVE A DAY E11.65  . JANUVIA 100 MG tablet TAKE 1 TABLET BY MOUTH EVERY DAY  . lactulose (CEPHULAC) 10 g packet Take 1 packet (10 g total) by mouth daily as needed. Reported on 07/29/2015 (Patient taking differently: Take 10 g by mouth daily as needed (for constipation). )  . Lancets (ACCU-CHEK SOFT TOUCH) lancets Use as instructed  . liraglutide (VICTOZA) 18 MG/3ML SOPN Take 0.41m daily for 1 week, then 1.236mdaily for 1 week, then 1.17m80maily.  . lMarland Kitchenratadine (CLARITIN) 10 MG tablet Take 1  tablet (10 mg total) by mouth 2 (two) times daily.  . methocarbamol (ROBAXIN) 500 MG tablet Take 1 tablet (500 mg total) by mouth every 8 (eight) hours as needed. (Patient taking differently: Take 500 mg by mouth every 8 (eight) hours as needed for muscle spasms. )  . mometasone-formoterol (DULERA) 200-5 MCG/ACT AERO Inhale 2 puffs into the lungs 2 (two) times daily.  . montelukast (SINGULAIR) 10 MG tablet Take 1 tablet (10 mg total) by mouth at bedtime.  . nitroGLYCERIN (NITRODUR - DOSED IN MG/24 HR) 0.2 mg/hr patch Apply 1/4th patch to affected achilles, change daily  . omeprazole (PRILOSEC) 20 MG capsule Take 1 capsule (20 mg total) by mouth daily before breakfast.  . topiramate (TOPAMAX) 25 MG tablet Take by mouth.   Current Facility-Administered Medications (Other)  Medication Route  . diphenhydrAMINE (BENADRYL) injection 12.5 mg Intramuscular      REVIEW OF SYSTEMS: ROS    Positive for: Gastrointestinal, Musculoskeletal, Endocrine, Eyes, Respiratory   Negative for: Constitutional, Neurological, Skin, Genitourinary, HENT, Cardiovascular, Psychiatric, Allergic/Imm, Heme/Lymph   Last edited by EngDoneen Poisson 08/24/2019  9:33 AM. (History)       ALLERGIES Allergies  Allergen Reactions  . Bee Venom Anaphylaxis and Swelling  . Wasp Venom Anaphylaxis and Swelling  . Citrus Hives and Itching  . Latex Hives and Swelling  . Penicillins Hives and Itching    Has patient had a PCN reaction causing immediate rash, facial/tongue/throat swelling, SOB or lightheadedness with hypotension: Yes Has patient had a PCN reaction causing severe rash involving mucus membranes or skin necrosis: Unk Has patient had a PCN reaction that required hospitalization: Was already in hosp Has patient had a PCN reaction occurring within the last 10 years: No If all of the above answers are "NO", then may proceed with Cephalosporin use.   . Shellfish Allergy Other (See Comments) and Swelling    Patient is  uncertain; stated that she tolerates this (??)  . Soap Hives, Itching and Swelling    PUREX LAUNDRY DETERGENT  . Tomato Hives and Itching  . Banana Itching and Nausea Only  . Chocolate Itching  . Fluticasone Other (See Comments)    Per patient, makes sneeze and gag  . Hydrocodone Itching and Nausea Only  . Other Itching    Acidic foods- Itching [place]- Itching in the roof of the mouth  . Adhesive [Tape] Itching and Rash    Allergic to  "leads"  . Doxycycline Other (See Comments)    AFFECTS JOINTS  . Metformin And Related Nausea Only, Rash, Other (See Comments) and  Diarrhea    GI UPSET, also  . Red Dye Itching, Rash and Other (See Comments)    At eye doctor's office, the dilating drops- Skin breaks out, chest turned red, itching, face became swollen    PAST MEDICAL HISTORY Past Medical History:  Diagnosis Date  . Acute nonintractable headache 12/26/2017  . Arthritis    major joints  . Asthma    prn inhaler  . Asthma exacerbation 07/25/2013  . Asthma in adult, severe persistent, with acute exacerbation 01/11/2018  . Carpal tunnel syndrome of right wrist 09/2012  . Cataract    NS OU  . Complication of anesthesia    states is hard to wake up post-op  . Constipation 12/30/2014  . Diabetes mellitus    intolerant to Metformin  . Diabetic retinopathy ([place])    NPDR OU  . Dry skin    hands   . Family history of anesthesia complication    mother went into coma during c-section  . Genital herpes 01/01/2006   on acyclovir BID   . GERD (gastroesophageal reflux disease)   . Herpes genital   . Hyperglycemia 12/26/2017  . Hypertensive retinopathy    OU  . Low back pain 11/22/2012  . Lower extremity edema 10/18/2012  . Menorrhagia 11/06/2015  . Morbid obesity with BMI of 50 to 60 07/23/2009  . Obesity hypoventilation syndrome ([place]) 10/30/2012  . Obesity, morbid ([place]) 01/10/2014  . OSA on CPAP     AHI was on  11-17-12 to 120/hr, titrated to 15 cm with 3 cm EPR/   . PCOS (polycystic  ovarian syndrome)   . Rheumatoid arthritis ([place])   . Right ankle pain 06/19/2015  . Right hip pain 09/04/2017  . Right shoulder pain 06/01/2017  . Seasonal allergies   . Sleep apnea, obstructive    uses CPAP nightly - had sleep study 08/22/2007   Past Surgical History:  Procedure Laterality Date  . CARPAL TUNNEL RELEASE Right 10/02/2012   Procedure: RIGHT CARPAL TUNNEL RELEASE ENDOSCOPIC;  Surgeon: Jolyn Nap, MD;  Location: [facility];  Service: Orthopedics;  Laterality: Right;  . CHOLECYSTECTOMY N/A 12/02/2014   Procedure: LAPAROSCOPIC CHOLECYSTECTOMY;  Surgeon: Armandina Gemma, MD;  Location: [facility];  Service: General;  Laterality: N/A;  . HYSTEROSCOPY WITH D & C  01-08-2002    FAMILY HISTORY Family History  Problem Relation Age of Onset  . Other Mother   . Leukemia Cousin   . Other Other   . Obesity Other   . Other Other   . Diabetes Maternal Aunt   . Heart attack Maternal Aunt   . Alzheimer's disease Maternal Uncle   . Cancer Maternal Uncle        prostate  . Diabetes Paternal Aunt   . Cancer Paternal Aunt        brain,mouth  . Alzheimer's disease Paternal Aunt   . Diabetes Paternal Uncle   . Diabetes Maternal Grandmother   . Diabetes Maternal Grandfather     SOCIAL HISTORY Social History   Tobacco Use  . Smoking status: Former Smoker    Years: 10.00    Quit date: 05/24/2010    Years since quitting: 9.2  . Smokeless tobacco: Never Used  Vaping Use  . Vaping Use: Never used  Substance Use Topics  . Alcohol use: Yes    Alcohol/week: 0.0 standard drinks    Comment: socially  . Drug use: No         OPHTHALMIC EXAM:  Base  Eye Exam    Visual Acuity (Snellen - Linear)      Right Left   Dist cc 20/40 -2 20/30 -1   Dist ph cc 20/25 +2 20/25 +2       Tonometry (Tonopen, 9:38 AM)      Right Left   Pressure 13 13       Pupils      Dark Light Shape React APD   Right 3 2 Round Brisk 0   Left 3 2 Round Brisk 0       Visual Fields       Left Right    Full Full       Extraocular Movement      Right Left    Full Full       Neuro/Psych    Oriented x3: Yes   Mood/Affect: Normal       Dilation    Both eyes: 1.0% Mydriacyl, 2.5% Phenylephrine @ 9:38 AM        Slit Lamp and Fundus Exam    Slit Lamp Exam      Right Left   Lids/Lashes Dermatochalasis - upper lid, mild Meibomian gland dysfunction Dermatochalasis - upper lid, mild Meibomian gland dysfunction   Conjunctiva/Sclera White and quiet White and quiet   Cornea trace Punctate epithelial erosions 1-2+Punctate epithelial erosions   Anterior Chamber deep and quiet deep and quiet   Iris round and dilated, No NVI round and dilated   Lens 1+ NS, 1+CC 1+NS, 1+CC   Vitreous mild vitreous syneresis mild vitreous syneresis       Fundus Exam      Right Left   Disc Pink and sharp Pink and sharp   C/D Ratio 0.6 0.6   Macula good foveal reflex, scattered punctate exudates/MA/IRH, persistent cystic changes greatest IT macula blunted foveal reflex, scatttered MA's and exudate, +cystic changes--persistent, focal cluster of exudates SN and IT   Vessels Vascular attenuation, Tortuous, Copper wiring Vascular attenuation, Tortuous, Copper wiring   Periphery Attached, scattered MA and exudate greatest nasally -- improving Attached, scattered MA and exudate greatest nasally, white without pressure temporally        Refraction    Wearing Rx      Sphere Cylinder Axis   Right -2.50 +1.50 013   Left -2.25 Sphere    Type: SVL          IMAGING AND PROCEDURES  Imaging and Procedures for _0 @  OCT, Retina - OU - Both Eyes       Right Eye Quality was good. Central Foveal Thickness: 232. Progression has worsened. Findings include normal foveal contour, no SRF, intraretinal fluid, intraretinal hyper-reflective material (Persistent IRF).   Left Eye Quality was good. Central Foveal Thickness: 249. Progression has worsened. Findings include intraretinal fluid, no SRF,  abnormal foveal contour, intraretinal hyper-reflective material (Mild Interval increase in IRF).   Notes *Images captured and stored on drive  Diagnosis / Impression:  +DME OU-- persistent IRF/IRHM OD: Interval increase in IRF/IRHM OS: persistent IRF  Clinical management:  See below  Abbreviations: NFP - Normal foveal profile. CME - cystoid macular edema. PED - pigment epithelial detachment. IRF - intraretinal fluid. SRF - subretinal fluid. EZ - ellipsoid zone. ERM - epiretinal membrane. ORA - outer retinal atrophy. ORT - outer retinal tubulation. SRHM - subretinal hyper-reflective material        Intravitreal Injection, Pharmacologic Agent - OD - Right Eye       Time Out 08/24/2019. 10:05 AM. Confirmed correct  patient, procedure, site, and patient consented.   Anesthesia Topical anesthesia was used. Anesthetic medications included Lidocaine 2%, Proparacaine 0.5%.   Procedure Preparation included 5% betadine to ocular surface, eyelid speculum. A (32g) needle was used.   Injection:  1.25 mg Bevacizumab (AVASTIN) SOLN   NDC: 69678-938-10, Lot: [id number] , Expiration date: 10/17/2019   Route: Intravitreal, Site: Right Eye, Waste: 0 mg  Post-op Post injection exam found visual acuity of at least counting fingers. The patient tolerated the procedure well. There were no complications. The patient received written and verbal post procedure care education.        Intravitreal Injection, Pharmacologic Agent - OS - Left Eye       Time Out 08/24/2019. 10:05 AM. Confirmed correct patient, procedure, site, and patient consented.   Anesthesia Topical anesthesia was used. Anesthetic medications included Lidocaine 2%, Proparacaine 0.5%.   Procedure Preparation included 5% betadine to ocular surface, eyelid speculum. A supplied needle was used.   Injection:  1.25 mg Bevacizumab (AVASTIN) SOLN   NDC: 17510-258-52, Lot: [id number] , Expiration date: 10/17/2019   Route: Intravitreal,  Site: Left Eye, Waste: 0 mg  Post-op Post injection exam found visual acuity of at least counting fingers. The patient tolerated the procedure well. There were no complications. The patient received written and verbal post procedure care education.                 ASSESSMENT/PLAN:    ICD-10-CM   1. Moderate nonproliferative diabetic retinopathy of both eyes with macular edema associated with type 2 diabetes mellitus (HCC)  D78.2423 Intravitreal Injection, Pharmacologic Agent - OD - Right Eye    Intravitreal Injection, Pharmacologic Agent - OS - Left Eye    Bevacizumab (AVASTIN) SOLN 1.25 mg    Bevacizumab (AVASTIN) SOLN 1.25 mg  2. Retinal edema  H35.81 OCT, Retina - OU - Both Eyes  3. Essential hypertension  I10   4. Hypertensive retinopathy of both eyes  H35.033   5. Nuclear sclerosis of both eyes  H25.13     1,2. Moderate non-proliferative diabetic retinopathy w/ DME, OU  - delayed f/u from 2 wks to 2 mos (3.19.21 to 5.26.21)  - pt lost to f/u from 10.23.2019 to 11.18.20  - FA in 2019 showed leaking MA OU; no NV OU  **of note, pt had pruritic rxn to fluorescein dye -- resolved with 12.5 mg IV benadryl**  - FA 12.16.2020 shows late leaking MA's, non-perfusion defects; no NV OU  - s/p IVA OD #1 (12.16.20), #2 (01.25.21), #3 (05.26.21)  - s/p focal laser OD (03.19.21)  - s/p IVA OS #1 OS (12.23.20), #2 (01.27.21), #3 (03.02.21), #4 (05.26.21)  - exam shows scattered MA, IRH, exudates and mild macular edema OU  - OCT shows interval increase in IRF/IRHM OD; OS: persistent IRF   - BCVA OU stable at 20/25-2             - Recommend IVA #4 OD and #5 OS today, 07.02.21             - Risks and benefits of tx discussed w/pt             - Pt wishes to proceed w/tx today             - informed consent obtained  - Avastin informed consent form signed and scanned on 01.27.2021             - see procedure note  - f/u 4 weeks, DFE, OCT  3,4.  Hypertensive retinopathy OU  - discussed  importance of tight BP control  - monitor  5. Nuclear Sclerosis   - The symptoms of cataract, surgical options, and treatments and risks were discussed with patient.  - discussed diagnosis and progression  - under the expert management of [place] Ordered this visit:  Meds ordered this encounter  Medications  . Bevacizumab (AVASTIN) SOLN 1.25 mg  . Bevacizumab (AVASTIN) SOLN 1.25 mg       Return in about 4 weeks (around 09/21/2019) for f/u NPDR OU, DFE, OCT.  There are no Patient Instructions on file for this visit.   Explained the diagnoses, plan, and follow up with the patient and they expressed understanding.  Patient expressed understanding of the importance of proper follow up care.  This document serves as a record of services personally performed by Gardiner Sleeper, MD, PhD. It was created on their behalf by San Jetty. Owens Shark, COT, an ophthalmic technician. The creation of this record is the provider's dictation and/or activities during the visit.    Electronically signed by: San Jetty. Owens Shark, [place] 06.30.2021 11:22 PM  Gardiner Sleeper, M.D., Ph.D. Diseases & Surgery of the Retina and Vitreous [facility]  I have reviewed the above documentation for accuracy and completeness, and I agree with the above. Gardiner Sleeper, M.D., Ph.D. 08/27/19 11:22 PM   Abbreviations: M myopia (nearsighted); A astigmatism; H hyperopia (farsighted); P presbyopia; Mrx spectacle prescription;  CTL contact lenses; OD right eye; OS left eye; OU both eyes  XT exotropia; ET esotropia; PEK punctate epithelial keratitis; PEE punctate epithelial erosions; DES dry eye syndrome; MGD meibomian gland dysfunction; ATs artificial tears; PFAT's preservative free artificial tears; [place] nuclear sclerotic cataract; PSC posterior subcapsular cataract; ERM epi-retinal membrane; PVD posterior vitreous detachment; RD retinal detachment; DM diabetes mellitus; DR diabetic  retinopathy; NPDR non-proliferative diabetic retinopathy; PDR proliferative diabetic retinopathy; CSME clinically significant macular edema; DME diabetic macular edema; dbh dot blot hemorrhages; CWS cotton wool spot; POAG primary open angle glaucoma; C/D cup-to-disc ratio; HVF humphrey visual field; GVF goldmann visual field; OCT optical coherence tomography; IOP intraocular pressure; BRVO Branch retinal vein occlusion; CRVO central retinal vein occlusion; CRAO central retinal artery occlusion; BRAO branch retinal artery occlusion; RT retinal tear; SB scleral buckle; PPV pars plana vitrectomy; VH Vitreous hemorrhage; PRP panretinal laser photocoagulation; IVK intravitreal kenalog; VMT vitreomacular traction; MH Macular hole;  NVD neovascularization of the disc; NVE neovascularization elsewhere; AREDS age related eye disease study; ARMD age related macular degeneration; POAG primary open angle glaucoma; EBMD epithelial/anterior basement membrane dystrophy; ACIOL anterior chamber intraocular lens; IOL intraocular lens; PCIOL posterior chamber intraocular lens; Phaco/IOL phacoemulsification with intraocular lens placement; [place] photorefractive keratectomy; LASIK laser assisted in situ keratomileusis; HTN hypertension; DM diabetes mellitus; COPD chronic obstructive pulmonary disease

## 2019-08-24 ENCOUNTER — Other Ambulatory Visit: Payer: Self-pay

## 2019-08-24 ENCOUNTER — Encounter (INDEPENDENT_AMBULATORY_CARE_PROVIDER_SITE_OTHER): Payer: Self-pay | Admitting: Ophthalmology

## 2019-08-24 ENCOUNTER — Ambulatory Visit (INDEPENDENT_AMBULATORY_CARE_PROVIDER_SITE_OTHER): Payer: Medicaid Other | Admitting: Ophthalmology

## 2019-08-24 DIAGNOSIS — H2513 Age-related nuclear cataract, bilateral: Secondary | ICD-10-CM

## 2019-08-24 DIAGNOSIS — I10 Essential (primary) hypertension: Secondary | ICD-10-CM

## 2019-08-24 DIAGNOSIS — H35033 Hypertensive retinopathy, bilateral: Secondary | ICD-10-CM | POA: Diagnosis not present

## 2019-08-24 DIAGNOSIS — H3581 Retinal edema: Secondary | ICD-10-CM | POA: Diagnosis not present

## 2019-08-24 DIAGNOSIS — E113313 Type 2 diabetes mellitus with moderate nonproliferative diabetic retinopathy with macular edema, bilateral: Secondary | ICD-10-CM

## 2019-08-27 MED ORDER — BEVACIZUMAB CHEMO INJECTION 1.25MG/0.05ML SYRINGE FOR KALEIDOSCOPE
1.2500 mg | INTRAVITREAL | Status: AC | PRN
  Administered 2019-08-27: 1.25 mg via INTRAVITREAL

## 2019-08-27 MED ORDER — BEVACIZUMAB CHEMO INJECTION 1.25MG/0.05ML SYRINGE FOR KALEIDOSCOPE
1.2500 mg | INTRAVITREAL | Status: AC | PRN
Start: 2019-08-27 — End: 2019-08-27
  Administered 2019-08-27: 1.25 mg via INTRAVITREAL

## 2019-08-29 ENCOUNTER — Ambulatory Visit: Payer: Medicaid Other | Admitting: Family Medicine

## 2019-09-24 ENCOUNTER — Encounter (INDEPENDENT_AMBULATORY_CARE_PROVIDER_SITE_OTHER): Payer: Self-pay

## 2019-09-24 ENCOUNTER — Encounter (INDEPENDENT_AMBULATORY_CARE_PROVIDER_SITE_OTHER): Payer: Medicaid Other | Admitting: Ophthalmology

## 2019-10-24 ENCOUNTER — Ambulatory Visit: Payer: Medicaid Other | Admitting: Family Medicine

## 2019-11-21 ENCOUNTER — Encounter: Payer: Self-pay | Admitting: Family Medicine

## 2019-11-21 ENCOUNTER — Ambulatory Visit: Payer: Medicaid Other | Admitting: Family Medicine

## 2019-11-21 ENCOUNTER — Ambulatory Visit (INDEPENDENT_AMBULATORY_CARE_PROVIDER_SITE_OTHER): Payer: Medicaid Other | Admitting: Family Medicine

## 2019-11-21 VITALS — BP 127/78 | HR 91 | Ht 62.0 in | Wt 312.0 lb

## 2019-11-21 DIAGNOSIS — G4733 Obstructive sleep apnea (adult) (pediatric): Secondary | ICD-10-CM

## 2019-11-21 DIAGNOSIS — Z9989 Dependence on other enabling machines and devices: Secondary | ICD-10-CM

## 2019-11-21 NOTE — Patient Instructions (Signed)
Please continue using your CPAP regularly. While your insurance requires that you use CPAP at least 4 hours each night on 70% of the nights, I recommend, that you not skip any nights and use it throughout the night if you can. Getting used to CPAP and staying with the treatment long term does take time and patience and discipline. Untreated obstructive sleep apnea when it is moderate to severe can have an adverse impact on cardiovascular health and raise her risk for heart disease, arrhythmias, hypertension, congestive heart failure, stroke and diabetes. Untreated obstructive sleep apnea causes sleep disruption, nonrestorative sleep, and sleep deprivation. This can have an impact on your day to day functioning and cause daytime sleepiness and impairment of cognitive function, memory loss, mood disturbance, and problems focussing. Using CPAP regularly can improve these symptoms.   I will adjust pressure setting on your machine. I will also send orders for a mask refitting. Please use CPAP every night. Work on sleep hygiene as discussed. Tips below!  Follow up in 3-4 months    Quality Sleep Information, Adult Quality sleep is important for your mental and physical health. It also improves your quality of life. Quality sleep means you:  Are asleep for most of the time you are in bed.  Fall asleep within 30 minutes.  Wake up no more than once a night.  Are awake for no longer than 20 minutes if you do wake up during the night. Most adults need 7-8 hours of quality sleep each night. How can poor sleep affect me? If you do not get enough quality sleep, you may have:  Mood swings.  Daytime sleepiness.  Confusion.  Decreased reaction time.  Sleep disorders, such as insomnia and sleep apnea.  Difficulty with: ? Solving problems. ? Coping with stress. ? Paying attention. These issues may affect your performance and productivity at work, school, and at home. Lack of sleep may also put  you at higher risk for accidents, suicide, and risky behaviors. If you do not get quality sleep you may also be at higher risk for several health problems, including:  Infections.  Type 2 diabetes.  Heart disease.  High blood pressure.  Obesity.  Worsening of long-term conditions, like arthritis, kidney disease, depression, Parkinson's disease, and epilepsy. What actions can I take to get more quality sleep?      Stick to a sleep schedule. Go to sleep and wake up at about the same time each day. Do not try to sleep less on weekdays and make up for lost sleep on weekends. This does not work.  Try to get about 30 minutes of exercise on most days. Do not exercise 2-3 hours before going to bed.  Limit naps during the day to 30 minutes or less.  Do not use any products that contain nicotine or tobacco, such as cigarettes or e-cigarettes. If you need help quitting, ask your health care provider.  Do not drink caffeinated beverages for at least 8 hours before going to bed. Coffee, tea, and some sodas contain caffeine.  Do not drink alcohol close to bedtime.  Do not eat large meals close to bedtime.  Do not take naps in the late afternoon.  Try to get at least 30 minutes of sunlight every day. Morning sunlight is best.  Make time to relax before bed. Reading, listening to music, or taking a hot bath promotes quality sleep.  Make your bedroom a place that promotes quality sleep. Keep your bedroom dark, quiet,  and at a comfortable room temperature. Make sure your bed is comfortable. Take out sleep distractions like TV, a computer, smartphone, and bright lights.  If you are lying awake in bed for longer than 20 minutes, get up and do a relaxing activity until you feel sleepy.  Work with your health care provider to treat medical conditions that may affect sleeping, such as: ? Nasal obstruction. ? Snoring. ? Sleep apnea and other sleep disorders.  Talk to your health care provider  if you think any of your prescription medicines may cause you to have difficulty falling or staying asleep.  If you have sleep problems, talk with a sleep consultant. If you think you have a sleep disorder, talk with your health care provider about getting evaluated by a specialist. Where to find more information  [place] website: https://sleepfoundation.org  National Heart, Lung, and [place] ([place]): http://www.saunders.info/.pdf  Centers for Disease Control and Prevention (CDC): LearningDermatology.pl Contact a health care provider if you:  Have trouble getting to sleep or staying asleep.  Often wake up very early in the morning and cannot get back to sleep.  Have daytime sleepiness.  Have daytime sleep attacks of suddenly falling asleep and sudden muscle weakness (narcolepsy).  Have a tingling sensation in your legs with a strong urge to move your legs (restless legs syndrome).  Stop breathing briefly during sleep (sleep apnea).  Think you have a sleep disorder or are taking a medicine that is affecting your quality of sleep. Summary  Most adults need 7-8 hours of quality sleep each night.  Getting enough quality sleep is an important part of health and well-being.  Make your bedroom a place that promotes quality sleep and avoid things that may cause you to have poor sleep, such as alcohol, caffeine, smoking, and large meals.  Talk to your health care provider if you have trouble falling asleep or staying asleep. This information is not intended to replace advice given to you by your health care provider. Make sure you discuss any questions you have with your health care provider. Document Revised: 05/18/2017 Document Reviewed: 05/18/2017 Elsevier Patient Education  [place].   Sleep Apnea Sleep apnea affects breathing during sleep. It causes breathing to stop for a short time or to become shallow. It  can also increase the risk of:  Heart attack.  Stroke.  Being very overweight (obese).  Diabetes.  Heart failure.  Irregular heartbeat. The goal of treatment is to help you breathe normally again. What are the causes? There are three kinds of sleep apnea:  Obstructive sleep apnea. This is caused by a blocked or collapsed airway.  Central sleep apnea. This happens when the brain does not send the right signals to the muscles that control breathing.  Mixed sleep apnea. This is a combination of obstructive and central sleep apnea. The most common cause of this condition is a collapsed or blocked airway. This can happen if:  Your throat muscles are too relaxed.  Your tongue and tonsils are too large.  You are overweight.  Your airway is too small. What increases the risk?  Being overweight.  Smoking.  Having a small airway.  Being older.  Being female.  Drinking alcohol.  Taking medicines to calm yourself (sedatives or tranquilizers).  Having family members with the condition. What are the signs or symptoms?  Trouble staying asleep.  Being sleepy or tired during the day.  Getting angry a lot.  Loud snoring.  Headaches in the  morning.  Not being able to focus your mind (concentrate).  Forgetting things.  Less interest in sex.  Mood swings.  Personality changes.  Feelings of sadness (depression).  Waking up a lot during the night to pee (urinate).  Dry mouth.  Sore throat. How is this diagnosed?  Your medical history.  A physical exam.  A test that is done when you are sleeping (sleep study). The test is most often done in a sleep lab but may also be done at home. How is this treated?   Sleeping on your side.  Using a medicine to get rid of mucus in your nose (decongestant).  Avoiding the use of alcohol, medicines to help you relax, or certain pain medicines (narcotics).  Losing weight, if needed.  Changing your diet.  Not  smoking.  Using a machine to open your airway while you sleep, such as: ? An oral appliance. This is a mouthpiece that shifts your lower jaw forward. ? A CPAP device. This device blows air through a mask when you breathe out (exhale). ? An EPAP device. This has valves that you put in each nostril. ? A BPAP device. This device blows air through a mask when you breathe in (inhale) and breathe out.  Having surgery if other treatments do not work. It is important to get treatment for sleep apnea. Without treatment, it can lead to:  High blood pressure.  Coronary artery disease.  In men, not being able to have an erection (impotence).  Reduced thinking ability. Follow these instructions at home: Lifestyle  Make changes that your doctor recommends.  Eat a healthy diet.  Lose weight if needed.  Avoid alcohol, medicines to help you relax, and some pain medicines.  Do not use any products that contain nicotine or tobacco, such as cigarettes, e-cigarettes, and chewing tobacco. If you need help quitting, ask your doctor. General instructions  Take over-the-counter and prescription medicines only as told by your doctor.  If you were given a machine to use while you sleep, use it only as told by your doctor.  If you are having surgery, make sure to tell your doctor you have sleep apnea. You may need to bring your device with you.  Keep all follow-up visits as told by your doctor. This is important. Contact a doctor if:  The machine that you were given to use during sleep bothers you or does not seem to be working.  You do not get better.  You get worse. Get help right away if:  Your chest hurts.  You have trouble breathing in enough air.  You have an uncomfortable feeling in your back, arms, or stomach.  You have trouble talking.  One side of your body feels weak.  A part of your face is hanging down. These symptoms may be an emergency. Do not wait to see if the symptoms  will go away. Get medical help right away. Call your local emergency services (911 in the [place] Do not drive yourself to the hospital. Summary  This condition affects breathing during sleep.  The most common cause is a collapsed or blocked airway.  The goal of treatment is to help you breathe normally while you sleep. This information is not intended to replace advice given to you by your health care provider. Make sure you discuss any questions you have with your health care provider. Document Revised: 11/25/2017 Document Reviewed: 10/04/2017 Elsevier Patient Education  [place].

## 2019-11-21 NOTE — Progress Notes (Signed)
Order for cpap supplies sent to [facility] via community msg. Confirmation received that the order transmitted was successful.

## 2019-11-21 NOTE — Progress Notes (Addendum)
PATIENT: Ana Long DOB: May 27, 1975  REASON FOR VISIT: follow up HISTORY FROM: patient  Chief Complaint  Patient presents with  . Follow-up  . Sleep Apnea     HISTORY OF PRESENT ILLNESS: Today 11/21/19 Ana Long is a 44 y.o. female here today for follow up for OSA on CPAP.  She admits that she has had more difficulty with CPAP compliance over the past few months.  She is not sleeping as well as she usually does.  She contributes this to going to sleep early in the morning, usually around 3-4am.  She usually wakes around 6:30 AM.  She feels that her mask is not fitting as well as it used to.  She has noticed an air leak that is bothersome.  Otherwise she is doing well.  She continues to follow closely with primary care for comorbidity management.  Compliance report dated 10/22/2019 through 11/20/2019 reveals that she used CPAP 24 of the past 30 days for compliance of 80%.  She used CPAP greater than 4 hours 12 of the past 30 days for compliance of 40%.  Average use on days used was 3 hours and 42 minutes.  Residual AHI was 27.9 events per hour on 4 to 15 cm of water pressure and an EPR of 3.  Average in the 95th percentile of 14.1 cm of water pressure.  There was a significant leak noted in the 95th percentile of 20.8 L/min.  HISTORY: (copied from my note on 01/25/2019)  Ana Long is a 44 y.o. female here today for follow up for OSA on CPAP.   Compliance report dated 12/25/2018 through 01/23/2019 reveals that she has used CPAP every day the last 30 days for compliance of 100%.  She has used CPAP greater than 4 hours 18 of the last 30 days for compliance of 60%.  Average usage was 4 hours and 32 minutes.  Residual AHI is 6.9 on 4 to 15 cm of water and an EPR of 3.  There was no significant leak noted.   HISTORY: (copied from my note on 10/25/2018)  Ana Long a 44 y.o.femalehere today for follow up for OSA on CPAP.She has been doing better with  compliance since last being seen. Compliance report dated 09/25/2018 through 10/24/2018 reveals that he is using CPAP 25 out of the last 30 days for compliance of 83%. She has used CPAP greater than 4 hours 16 of those days for compliance of 53%. Average usage was 4 hours and 19 minutes. AHI was 12.8 on 4 to 15 cm of water. There was no significant leak. Pressurein the 95th percentile at 13.8. She is tolerating pressures.  HISTORY: (copied frommynote on 09/13/2018)  Ana Long a 44 y.o.femalehere today for follow up for OSA on CPAP.She reports that she has done better with compliance. Unfortunately, she did have a hospitalization recently for COVID that inhibited her ability to use CPAP. Since returning home she has been able to use CPAP nightly. She is continuing to work on greater than 4 hours usage. Compliance report dated 08/14/2018 through 09/12/2018 reveals that she has used her machine 23 out of the last 30 days for compliance of 77%. 10 of those days she used her machine greater than 4 hours for compliance of 33%. AHI remains elevated at 10.7 on 4 to 15 cm of water and an EPR of 3. There was no significant leak.  Today she was admitted to [facility] for + COVID testing on  08/28/2018 and discharged on 09/02/2018.She reports being in quarantine until 09/11/2018. She continues to have little energy and a persistent cough. She is currently being treated for PNA associated with COVID.She is taking multiple steroid medications. She has completed course of antibiotics.  She reports that as she entered our office today her daughter pointed out that she had a tick attached to the backside of her right upper arm. She has not noted this to and is unsure how long it has been there. While in the office with me today she called primary care and scheduled an appointment for removal and possible treatment if indicated.  HISTORY: (copied frommynote on 06/13/2018)location  Ana A  Long a 44 y.o.femalefor follow up of OSA on CPAP.[name] that she has not used her machine regularly. She was having some trouble with shoulder pain and unable to sleep well at night. For the past week she has been sleeping better. She had a steroid injection placed last week. She states that since she has used her CPAP machine every night and for greater than 4 hours each night.     History (copied from Dr Dohmeier's note on 03/15/2018)  Ana Long a 44 y.o.female, seen here as in a referral from Dr. Regenia Skeeter a sleep evaluation. Mrs. Joos-Ward happiness patient of our sleep practice when she was under Dr. Dimitri Ped care, I performed a split-night polysomnography with her on 17 November 2012 at the time she had endorsed the Epworth Sleepiness Scale at 18 points and had a BMI of 57, she was short of breath at rest. Her AHI was 120of the highest apnea and hypopnea indecesseen in our care. The patient received an S9 Elite CPAP machine after she had been titrated to a pressure of 15 cmH2O with 3 cm EPR she also used a Swift fracture interface at the time. She continued to use her CPAP until recently but it broke. As far as I can see she has no compliance data for November which was when she was hospitalized. During her hospitalization it was evident that she had severe hypoxemia whenever she slept her oxygen levels desaturated. Looking at her last compliance report for the last 30 days she has been only 7% or 2 days able to use her CPAP machine for longer than 4 hours, so this is a poor compliance but she assured me that her machine is not working. She does no longer have a fresh mask or interface that would seal. The residual AHI is between 11.8 and 9.6, she has high air leaks with a maximum up to 61.8 L/min. In other words her download are invalid. Patient needs an urgent return for a CPAP titration also to see if sleep hypoxemia is  sufficiently treated with CPAP alone or if she has need for oxygen.  She has many other chronic medical problems as I can see under the progress notes that were cosigned by Oval Linsey, MD. Polycystic ovarian syndrome rheumatoid arthritis, hyperglycemia, gastroesophageal reflux disease, diabetes mellitus type 2, asthma exacerbation in adult severe and persistent, shortness of breath, major joint osteoarthritis, acute non-intractable headaches, shoulder pain back pain ankle pain hip pain. Her current weight exceeds 330 pounds, her diabetes was considered uncontrolled.  She currently endorsed the fatigue severity score 45 points out of 63, the Epworth Sleepiness Scale was endorsed at 19 out of 24 points.  Chief complaint according to patient :" I have dropped my oxygen sats in the hospital? Had a severe asthma attack at the  time, too.  Sleep habits are as follows: Dinner time is 7 Pm and bedtime is at 2-3 AM, former third shift worker- now disabled. she spends her evenings after dinner with dinner prep for the next day, homework for the children and laundry. The bedroom is either cool, nor quiet but dark. She has a Warehouse manager ( !) with a night light in her bedroom. Uses albuterol and breathing treatment at bedtime.  She has nocturia times 2-3 , she sleeps on her elevated bed- supine ,on multiple pillows.  Has orthopneoa, children report her snoring, pausing , her chest rattling. She wakes at 5 AM to get her children to school, who are 5, 81, 66 years old.  Sleep medical history and family sleep history:OSA severe for a long time, see previous sleep study.  Family history of OSA on maternal side, 2 uncles, one aunt and cousins. Paternal aunts and father himself.  Social history:Non smoker, raises 3 children , but has guardianship over 3 children, mother  She would like to adopt. No alcohol.   REVIEW OF SYSTEMS: Out of a complete 14 system review of symptoms, the patient  complains only of the following symptoms, insomnia and all other reviewed systems are negative.  ESS: 15 FSS: 40  ALLERGIES: Allergies  Allergen Reactions  . Bee Venom Anaphylaxis and Swelling  . Wasp Venom Anaphylaxis and Swelling  . Citrus Hives and Itching  . Latex Hives and Swelling  . Penicillins Hives and Itching    Has patient had a PCN reaction causing immediate rash, facial/tongue/throat swelling, SOB or lightheadedness with hypotension: Yes Has patient had a PCN reaction causing severe rash involving mucus membranes or skin necrosis: Unk Has patient had a PCN reaction that required hospitalization: Was already in hosp Has patient had a PCN reaction occurring within the last 10 years: No If all of the above answers are "NO", then may proceed with Cephalosporin use.   . Shellfish Allergy Other (See Comments) and Swelling    Patient is uncertain; stated that she tolerates this (??)  . Soap Hives, Itching and Swelling    PUREX LAUNDRY DETERGENT  . Tomato Hives and Itching  . Banana Itching and Nausea Only  . Chocolate Itching  . Fluticasone Other (See Comments)    Per patient, makes sneeze and gag  . Hydrocodone Itching and Nausea Only  . Other Itching    Acidic foods- Itching [place]- Itching in the roof of the mouth  . Adhesive [Tape] Itching and Rash    Allergic to  "leads"  . Doxycycline Other (See Comments)    AFFECTS JOINTS  . Metformin And Related Nausea Only, Rash, Other (See Comments) and Diarrhea    GI UPSET, also  . Red Dye Itching, Rash and Other (See Comments)    At eye doctor's office, the dilating drops- Skin breaks out, chest turned red, itching, face became swollen    HOME MEDICATIONS: Outpatient Medications Prior to Visit  Medication Sig Dispense Refill  . acyclovir (ZOVIRAX) 400 MG tablet TAKE 1 TABLET BY MOUTH TWICE A DAY 60 tablet 2  . albuterol (PROAIR HFA) 108 (90 Base) MCG/ACT inhaler Inhale 2 puffs into the lungs every 4 (four) hours as  needed for wheezing or shortness of breath (cough). (Patient taking differently: Inhale 2 puffs into the lungs every 4 (four) hours as needed for wheezing or shortness of breath (or coughing). ) 6.7 g 2  . atorvastatin (LIPITOR) 40 MG tablet Take 1 tablet (40 mg total) by  mouth at bedtime. 90 tablet 1  . benzonatate (TESSALON) 200 MG capsule Take 1 capsule (200 mg total) by mouth 3 (three) times daily as needed for cough. 20 capsule 0  . Blood Glucose Monitoring Suppl (ACCU-CHEK AVIVA PLUS) w/Device KIT Please check you blood sugars 4 times a day 1 kit 0  . dapagliflozin propanediol (FARXIGA) 5 MG TABS tablet Take 5 mg by mouth daily. (Patient taking differently: Take 5 mg by mouth 2 (two) times a day. ) 90 tablet 1  . fluconazole (DIFLUCAN) 150 MG tablet TAKE 1 TABLET BY MOUTH NOW. THEN REPEAT IN 1 WEEK    . FLUoxetine (PROZAC) 20 MG capsule Take by mouth.    . furosemide (LASIX) 20 MG tablet Take 20 mg by mouth daily as needed for fluid or edema.     Marland Kitchen glucose blood (ACCU-CHEK SMARTVIEW) test strip TEST FOUR TIMES DAILY 150 each 6  . hydrochlorothiazide (HYDRODIURIL) 12.5 MG tablet Take by mouth.    . insulin aspart (NOVOLOG) 100 UNIT/ML injection Inject 0-20 Units into the skin every 4 (four) hours. CBG 70 - 120: 0 units  CBG 121 - 150: 3 units  CBG 151 - 200: 4 units  CBG 201 - 250: 7 units  CBG 251 - 300: 11 units  CBG 301 - 350: 15 units  CBG 351 - 400: 20 units  CBG > 400 call MD (Patient taking differently: Inject 20 Units into the skin every 4 (four) hours. CBG 70 - 120: 0 units  CBG 121 - 150: 3 units  CBG 151 - 200: 4 units  CBG 201 - 250: 7 units  CBG 251 - 300: 11 units  CBG 301 - 350: 15 units  CBG 351 - 400: 20 units  CBG > 400 call MD) 10 mL 11  . insulin aspart protamine - aspart (NOVOLOG MIX 70/30 FLEXPEN) (70-30) 100 UNIT/ML FlexPen Inject into the skin.    . Insulin Pen Needle (B-D UF III MINI PEN NEEDLES) 31G X 5 MM MISC USE AS DIRECTED TO INJECT INSULIN FIVE A DAY  E11.65 100 each 6  . JANUVIA 100 MG tablet TAKE 1 TABLET BY MOUTH EVERY DAY 90 tablet 3  . lactulose (CEPHULAC) 10 g packet Take 1 packet (10 g total) by mouth daily as needed. Reported on 07/29/2015 (Patient taking differently: Take 10 g by mouth daily as needed (for constipation). ) 30 each 0  . Lancets (ACCU-CHEK SOFT TOUCH) lancets Use as instructed 100 each 12  . liraglutide (VICTOZA) 18 MG/3ML SOPN Take 0.59m daily for 1 week, then 1.253mdaily for 1 week, then 1.67m66maily.    . lMarland Kitchenratadine (CLARITIN) 10 MG tablet Take 1 tablet (10 mg total) by mouth 2 (two) times daily. 180 tablet 1  . methocarbamol (ROBAXIN) 500 MG tablet Take 1 tablet (500 mg total) by mouth every 8 (eight) hours as needed. (Patient taking differently: Take 500 mg by mouth every 8 (eight) hours as needed for muscle spasms. ) 60 tablet 1  . mometasone-formoterol (DULERA) 200-5 MCG/ACT AERO Inhale 2 puffs into the lungs 2 (two) times daily. 13 Inhaler 1  . montelukast (SINGULAIR) 10 MG tablet Take 1 tablet (10 mg total) by mouth at bedtime. 90 tablet 1  . nitroGLYCERIN (NITRODUR - DOSED IN MG/24 HR) 0.2 mg/hr patch Apply 1/4th patch to affected achilles, change daily 30 patch 1  . omeprazole (PRILOSEC) 20 MG capsule Take 1 capsule (20 mg total) by mouth daily before breakfast. 90 capsule 2  .  topiramate (TOPAMAX) 25 MG tablet Take by mouth.    . Insulin Glargine (LANTUS) 100 UNIT/ML Solostar Pen Inject 80 Units into the skin 2 (two) times daily. 15 pen 0   Facility-Administered Medications Prior to Visit  Medication Dose Route Frequency Provider Last Rate Last Admin  . diphenhydrAMINE (BENADRYL) injection 12.5 mg  12.5 mg Intramuscular Once Bernarda Caffey, MD        PAST MEDICAL HISTORY: Past Medical History:  Diagnosis Date  . Acute nonintractable headache 12/26/2017  . Arthritis    major joints  . Asthma    prn inhaler  . Asthma exacerbation 07/25/2013  . Asthma in adult, severe persistent, with acute exacerbation  01/11/2018  . Carpal tunnel syndrome of right wrist 09/2012  . Cataract    NS OU  . Complication of anesthesia    states is hard to wake up post-op  . Constipation 12/30/2014  . Diabetes mellitus    intolerant to Metformin  . Diabetic retinopathy ([place])    NPDR OU  . Dry skin    hands   . Family history of anesthesia complication    mother went into coma during c-section  . Genital herpes 01/01/2006   on acyclovir BID   . GERD (gastroesophageal reflux disease)   . Herpes genital   . Hyperglycemia 12/26/2017  . Hypertensive retinopathy    OU  . Low back pain 11/22/2012  . Lower extremity edema 10/18/2012  . Menorrhagia 11/06/2015  . Morbid obesity with BMI of 50 to 60 07/23/2009  . Obesity hypoventilation syndrome ([place]) 10/30/2012  . Obesity, morbid ([place]) 01/10/2014  . OSA on CPAP     AHI was on  11-17-12 to 120/hr, titrated to 15 cm with 3 cm EPR/   . PCOS (polycystic ovarian syndrome)   . Rheumatoid arthritis ([place])   . Right ankle pain 06/19/2015  . Right hip pain 09/04/2017  . Right shoulder pain 06/01/2017  . Seasonal allergies   . Sleep apnea, obstructive    uses CPAP nightly - had sleep study 08/22/2007    PAST SURGICAL HISTORY: Past Surgical History:  Procedure Laterality Date  . CARPAL TUNNEL RELEASE Right 10/02/2012   Procedure: RIGHT CARPAL TUNNEL RELEASE ENDOSCOPIC;  Surgeon: Ana Nap, MD;  Location: [facility];  Service: Orthopedics;  Laterality: Right;  . CHOLECYSTECTOMY N/A 12/02/2014   Procedure: LAPAROSCOPIC CHOLECYSTECTOMY;  Surgeon: Armandina Gemma, MD;  Location: [facility];  Service: General;  Laterality: N/A;  . HYSTEROSCOPY WITH D & C  01-08-2002    FAMILY HISTORY: Family History  Problem Relation Age of Onset  . Other Mother   . Leukemia Cousin   . Other Other   . Obesity Other   . Other Other   . Diabetes Maternal Aunt   . Heart attack Maternal Aunt   . Alzheimer's disease Maternal Uncle   . Cancer Maternal Uncle        prostate  .  Diabetes Paternal Aunt   . Cancer Paternal Aunt        brain,mouth  . Alzheimer's disease Paternal Aunt   . Diabetes Paternal Uncle   . Diabetes Maternal Grandmother   . Diabetes Maternal Grandfather     SOCIAL HISTORY: Social History   Socioeconomic History  . Marital status: Divorced    Spouse name: Not on file  . Number of children: 2  . Years of education: 68  . Highest education level: Not on file  Occupational History  . Occupation: UNEMPLOYED  Employer: UNEMPLOYED  Tobacco Use  . Smoking status: Former Smoker    Years: 10.00    Quit date: 05/24/2010    Years since quitting: 9.5  . Smokeless tobacco: Never Used  Vaping Use  . Vaping Use: Never used  Substance and Sexual Activity  . Alcohol use: Yes    Alcohol/week: 0.0 standard drinks    Comment: socially  . Drug use: No  . Sexual activity: Yes    Birth control/protection: None    Comment: Lives w/a partner  Other Topics Concern  . Not on file  Social History Narrative   Single, but lives with a partner   Has 2 children and cares for an infant during the day         Social Determinants of Health   Financial Resource Strain:   . Difficulty of Paying Living Expenses: Not on file  Food Insecurity:   . Worried About Charity fundraiser in the Last Year: Not on file  . Ran Out of Food in the Last Year: Not on file  Transportation Needs:   . Lack of Transportation (Medical): Not on file  . Lack of Transportation (Non-Medical): Not on file  Physical Activity:   . Days of Exercise per Week: Not on file  . Minutes of Exercise per Session: Not on file  Stress:   . Feeling of Stress : Not on file  Social Connections:   . Frequency of Communication with Friends and Family: Not on file  . Frequency of Social Gatherings with Friends and Family: Not on file  . Attends Religious Services: Not on file  . Active Member of Clubs or Organizations: Not on file  . Attends Archivist Meetings: Not on file  .  Marital Status: Not on file  Intimate Partner Violence:   . Fear of Current or Ex-Partner: Not on file  . Emotionally Abused: Not on file  . Physically Abused: Not on file  . Sexually Abused: Not on file      PHYSICAL EXAM  Vitals:   11/21/19 1532  BP: 127/78  Pulse: 91  Weight: (!) 312 lb (141.5 kg)  Height: 5' 2"  (1.575 m)   Body mass index is 57.07 kg/m.  Generalized: Well developed, in no acute distress  Cardiology: normal rate and rhythm, no murmur noted Respiratory: clear to auscultation bilaterally  Neurological examination  Mentation: Alert oriented to time, place, history taking. Follows all commands speech and language fluent Cranial nerve II-XII: Pupils were equal round reactive to light. Extraocular movements were full, visual field were full Motor: The motor testing reveals 5 over 5 strength of all 4 extremities. Good symmetric motor tone is noted throughout.  Gait and station: Gait is wide   DIAGNOSTIC DATA (LABS, IMAGING, TESTING) - I reviewed patient records, labs, notes, testing and imaging myself where available.  No flowsheet data found.   Lab Results  Component Value Date   WBC 12.7 (H) 09/02/2018   HGB 12.4 09/02/2018   HCT 40.9 09/02/2018   MCV 81.2 09/02/2018   PLT 335 09/02/2018      Component Value Date/Time   NA 138 09/02/2018 0540   NA 139 10/21/2016 1525   K 3.6 09/02/2018 0540   CL 101 09/02/2018 0540   CO2 26 09/02/2018 0540   GLUCOSE 179 (H) 09/02/2018 0540   BUN 18 09/02/2018 0540   BUN 6 10/21/2016 1525   CREATININE 0.62 09/02/2018 0540   CREATININE 0.60 07/24/2014 1550   CALCIUM  8.7 (L) 09/02/2018 0540   PROT 6.8 09/02/2018 0540   PROT 6.9 12/30/2014 1641   ALBUMIN 3.0 (L) 09/02/2018 0540   ALBUMIN 3.9 12/30/2014 1641   AST 13 (L) 09/02/2018 0540   ALT 17 09/02/2018 0540   ALKPHOS 40 09/02/2018 0540   BILITOT 0.3 09/02/2018 0540   BILITOT 0.3 12/30/2014 1641   GFRNONAA >60 09/02/2018 0540   GFRNONAA >89 07/24/2014  1550   GFRAA >60 09/02/2018 0540   GFRAA >89 07/24/2014 1550   Lab Results  Component Value Date   CHOL 148 07/24/2014   HDL 41 (L) 07/24/2014   LDLCALC 85 07/24/2014   TRIG 112 07/24/2014   CHOLHDL 3.6 07/24/2014   Lab Results  Component Value Date   HGBA1C 12.0 (H) 08/29/2018   Lab Results  Component Value Date   VITAMINB12 352 02/19/2008   Lab Results  Component Value Date   TSH 1.040 11/16/2017       ASSESSMENT AND PLAN 44 y.o. year old female  has a past medical history of Acute nonintractable headache (12/26/2017), Arthritis, Asthma, Asthma exacerbation (07/25/2013), Asthma in adult, severe persistent, with acute exacerbation (01/11/2018), Carpal tunnel syndrome of right wrist (09/2012), Cataract, Complication of anesthesia, Constipation (12/30/2014), Diabetes mellitus, Diabetic retinopathy ([place]), Dry skin, Family history of anesthesia complication, Genital herpes (01/01/2006), GERD (gastroesophageal reflux disease), Herpes genital, Hyperglycemia (12/26/2017), Hypertensive retinopathy, Low back pain (11/22/2012), Lower extremity edema (10/18/2012), Menorrhagia (11/06/2015), Morbid obesity with BMI of 50 to 60 (07/23/2009), Obesity hypoventilation syndrome ([place]) (10/30/2012), Obesity, morbid ([place]) (01/10/2014), OSA on CPAP, PCOS (polycystic ovarian syndrome), Rheumatoid arthritis ([place]), Right ankle pain (06/19/2015), Right hip pain (09/04/2017), Right shoulder pain (06/01/2017), Seasonal allergies, and Sleep apnea, obstructive. here with     ICD-10-CM   1. OSA on CPAP  G47.33 For home use only DME continuous positive airway pressure (CPAP)   Z99.89 For home use only DME continuous positive airway pressure (CPAP)    Alyxis has had more difficulty with meeting recommended compliance over the past few months.  I suspect that there are multiple factors.  We have discussed need for appropriate sleep hygiene.  I have provided additional information in her AVS today for her to review.  She will  practice these at home.  Her AHI is elevated at 27.9 events per hour.  I will increase her minimum pressure to 5 cm of water pressure and maximum to 16 cm of water pressure.  I will also send her for a mask refitting due to concerns of leak.  She is aware of how to monitor for appropriate mask seal at home.  I will repeat download in 4 to 6 weeks.  Healthy lifestyle habits encouraged.  She will continue close follow-up with primary care.  She will follow up with me in 3 months, sooner if needed.  She verbalizes understanding and agreement with this plan.   Addendum 01/10/2020: I have reviewed most recent download report dated 12/10/2019 through 01/18/2020.  She has used CPAP 27 of the past 30 days for 90% compliance with 47% 4-hour compliance.  Compliance has improved slightly from last visit.  Residual AHI remains elevated at 26.2 events per hour with pressure change to 5 to 16 cm of water.  Maximum pressure reported as 14.8.  No significant leak.  I will have her come back in for a titration study due to elevated AHI.  Orders Placed This Encounter  Procedures  . For home use only DME continuous positive airway pressure (CPAP)  Mask refitting please    Order Specific Question:   Length of Need    Answer:   Lifetime    Order Specific Question:   Patient has OSA or probable OSA    Answer:   Yes    Order Specific Question:   Is the patient currently using CPAP in the home    Answer:   Yes    Order Specific Question:   Settings    Answer:   Other see comments    Order Specific Question:   CPAP supplies needed    Answer:   Mask, headgear, cushions, filters, heated tubing and water chamber  . For home use only DME continuous positive airway pressure (CPAP)    Please adjust pressure setting to minimum pressure of 5cmH20 and maximum pressure to 16cmH20    Order Specific Question:   Length of Need    Answer:   Lifetime    Order Specific Question:   Patient has OSA or probable OSA    Answer:   Yes      Order Specific Question:   Is the patient currently using CPAP in the home    Answer:   Yes    Order Specific Question:   Settings    Answer:   Other see comments    Order Specific Question:   CPAP supplies needed    Answer:   Mask, headgear, cushions, filters, heated tubing and water chamber     No orders of the defined types were placed in this encounter.     I spent 20 minutes with the patient. 50% of this time was spent counseling and educating patient on plan of care and medications.    Debbora Presto, FNP-C 11/21/2019, 4:05 PM [facility] [address] [place], [place] [address] [phone]

## 2019-11-21 NOTE — Progress Notes (Deleted)
PATIENT: Ana Long DOB: Aug 04, 1975  REASON FOR VISIT: follow up HISTORY FROM: patient  No chief complaint on file.    HISTORY OF PRESENT ILLNESS: Today 11/21/19 Ana Long is a 44 y.o. female here today for follow up for OSA on CPAP.   Compliance report dated 10/22/2019 through 11/20/2019 reveals that she used CPAP 24 of the past 30 days for compliance of 80%.  She used CPAP greater than 4 hours 12 of the past 30 days for compliance of 40%.  Average usage on days used was 3 hours and 42 minutes.  Residual AHI was 27.9 events per hour on 4 to 15 cm of water pressure and an EPR 3.  Pressure settings in the 95th percentile of 14.1 cm of water.  There was a leak noted in the 95th percentile of 20.8 L/min.    HISTORY: (copied from my note on 03/28/2019)  Ana Long is a 44 y.o. female here today for follow up for OSA on CPAP.   Compliance report dated 12/25/2018 through 01/23/2019 reveals that she has used CPAP every day the last 30 days for compliance of 100%.  She has used CPAP greater than 4 hours 18 of the last 30 days for compliance of 60%.  Average usage was 4 hours and 32 minutes.  Residual AHI is 6.9 on 4 to 15 cm of water and an EPR of 3.  There was no significant leak noted.   HISTORY: (copied from my note on 10/25/2018)  Ana Long a 44 y.o.femalehere today for follow up for OSA on CPAP.She has been doing better with compliance since last being seen. Compliance report dated 09/25/2018 through 10/24/2018 reveals that he is using CPAP 25 out of the last 30 days for compliance of 83%. She has used CPAP greater than 4 hours 16 of those days for compliance of 53%. Average usage was 4 hours and 19 minutes. AHI was 12.8 on 4 to 15 cm of water. There was no significant leak. Pressurein the 95th percentile at 13.8. She is tolerating pressures.  HISTORY: (copied frommynote on 09/13/2018)  Ana Long a 44 y.o.femalehere today for  follow up for OSA on CPAP.She reports that she has done better with compliance. Unfortunately, she did have a hospitalization recently for COVID that inhibited her ability to use CPAP. Since returning home she has been able to use CPAP nightly. She is continuing to work on greater than 4 hours usage. Compliance report dated 08/14/2018 through 09/12/2018 reveals that she has used her machine 23 out of the last 30 days for compliance of 77%. 10 of those days she used her machine greater than 4 hours for compliance of 33%. AHI remains elevated at 10.7 on 4 to 15 cm of water and an EPR of 3. There was no significant leak.  Today she was admitted to [facility] for + COVID testing on 08/28/2018 and discharged on 09/02/2018.She reports being in quarantine until 09/11/2018. She continues to have little energy and a persistent cough. She is currently being treated for PNA associated with COVID.She is taking multiple steroid medications. She has completed course of antibiotics.  She reports that as she entered our office today her daughter pointed out that she had a tick attached to the backside of her right upper arm. She has not noted this to and is unsure how long it has been there. While in the office with me today she called primary care and scheduled an appointment for  removal and possible treatment if indicated.  HISTORY: (copied frommynote on 06/13/2018)location  Ana Long a 44 y.o.femalefor follow up of OSA on CPAP.[name] that she has not used her machine regularly. She was having some trouble with shoulder pain and unable to sleep well at night. For the past week she has been sleeping better. She had a steroid injection placed last week. She states that since she has used her CPAP machine every night and for greater than 4 hours each night.     History (copied from Dr Dohmeier's note on 03/15/2018)  Ana Long a 44 y.o.female, seen here  as in a referral from Dr. Regenia Skeeter a sleep evaluation. [name] happiness patient of our sleep practice when she was under Dr. Dimitri Ped care, I performed a split-night polysomnography with her on 17 November 2012 at the time she had endorsed the Epworth Sleepiness Scale at 18 points and had a BMI of 57, she was short of breath at rest. Her AHI was 120of the highest apnea and hypopnea indecesseen in our care. The patient received an S9 Elite CPAP machine after she had been titrated to a pressure of 15 cmH2O with 3 cm EPR she also used a Swift fracture interface at the time. She continued to use her CPAP until recently but it broke. As far as I can see she has no compliance data for November which was when she was hospitalized. During her hospitalization it was evident that she had severe hypoxemia whenever she slept her oxygen levels desaturated. Looking at her last compliance report for the last 30 days she has been only 7% or 2 days able to use her CPAP machine for longer than 4 hours, so this is a poor compliance but she assured me that her machine is not working. She does no longer have a fresh mask or interface that would seal. The residual AHI is between 11.8 and 9.6, she has high air leaks with a maximum up to 61.8 L/min. In other words her download are invalid. Patient needs an urgent return for a CPAP titration also to see if sleep hypoxemia is sufficiently treated with CPAP alone or if she has need for oxygen.  She has many other chronic medical problems as I can see under the progress notes that were cosigned by Oval Linsey, MD. Polycystic ovarian syndrome rheumatoid arthritis, hyperglycemia, gastroesophageal reflux disease, diabetes mellitus type 2, asthma exacerbation in adult severe and persistent, shortness of breath, major joint osteoarthritis, acute non-intractable headaches, shoulder pain back pain ankle pain hip pain. Her current weight exceeds 330 pounds,  her diabetes was considered uncontrolled.  She currently endorsed the fatigue severity score 45 points out of 63, the Epworth Sleepiness Scale was endorsed at 19 out of 24 points.  Chief complaint according to patient :" I have dropped my oxygen sats in the hospital? Had a severe asthma attack at the time, too.  Sleep habits are as follows: Dinner time is 7 Pm and bedtime is at 2-3 AM, former third shift worker- now disabled. she spends her evenings after dinner with dinner prep for the next day, homework for the children and laundry. The bedroom is either cool, nor quiet but dark. She has a Warehouse manager ( !) with a night light in her bedroom. Uses albuterol and breathing treatment at bedtime.  She has nocturia times 2-3 , she sleeps on her elevated bed- supine ,on multiple pillows.  Has orthopneoa, children report her snoring, pausing , her  chest rattling. She wakes at 5 AM to get her children to school, who are 75, 34, 64 years old.  Sleep medical history and family sleep history:OSA severe for a long time, see previous sleep study.  Family history of OSA on maternal side, 2 uncles, one aunt and cousins. Paternal aunts and father himself.  Social history:Non smoker, raises 3 children , but has guardianship over 3 children, mother  She would like to adopt. No alcohol.   REVIEW OF SYSTEMS: Out of a complete 14 system review of symptoms, the patient complains only of the following symptoms, and all other reviewed systems are negative.  ALLERGIES: Allergies  Allergen Reactions  . Bee Venom Anaphylaxis and Swelling  . Wasp Venom Anaphylaxis and Swelling  . Citrus Hives and Itching  . Latex Hives and Swelling  . Penicillins Hives and Itching    Has patient had a PCN reaction causing immediate rash, facial/tongue/throat swelling, SOB or lightheadedness with hypotension: Yes Has patient had a PCN reaction causing severe rash involving mucus membranes or skin necrosis:  Unk Has patient had a PCN reaction that required hospitalization: Was already in hosp Has patient had a PCN reaction occurring within the last 10 years: No If all of the above answers are "NO", then may proceed with Cephalosporin use.   . Shellfish Allergy Other (See Comments) and Swelling    Patient is uncertain; stated that she tolerates this (??)  . Soap Hives, Itching and Swelling    PUREX LAUNDRY DETERGENT  . Tomato Hives and Itching  . Banana Itching and Nausea Only  . Chocolate Itching  . Fluticasone Other (See Comments)    Per patient, makes sneeze and gag  . Hydrocodone Itching and Nausea Only  . Other Itching    Acidic foods- Itching [place]- Itching in the roof of the mouth  . Adhesive [Tape] Itching and Rash    Allergic to  "leads"  . Doxycycline Other (See Comments)    AFFECTS JOINTS  . Metformin And Related Nausea Only, Rash, Other (See Comments) and Diarrhea    GI UPSET, also  . Red Dye Itching, Rash and Other (See Comments)    At eye doctor's office, the dilating drops- Skin breaks out, chest turned red, itching, face became swollen    HOME MEDICATIONS: Outpatient Medications Prior to Visit  Medication Sig Dispense Refill  . acyclovir (ZOVIRAX) 400 MG tablet TAKE 1 TABLET BY MOUTH TWICE A DAY 60 tablet 2  . albuterol (PROAIR HFA) 108 (90 Base) MCG/ACT inhaler Inhale 2 puffs into the lungs every 4 (four) hours as needed for wheezing or shortness of breath (cough). (Patient taking differently: Inhale 2 puffs into the lungs every 4 (four) hours as needed for wheezing or shortness of breath (or coughing). ) 6.7 g 2  . atorvastatin (LIPITOR) 40 MG tablet Take 1 tablet (40 mg total) by mouth at bedtime. 90 tablet 1  . benzonatate (TESSALON) 200 MG capsule Take 1 capsule (200 mg total) by mouth 3 (three) times daily as needed for cough. 20 capsule 0  . Blood Glucose Monitoring Suppl (ACCU-CHEK AVIVA PLUS) w/Device KIT Please check you blood sugars 4 times a day 1 kit 0  .  dapagliflozin propanediol (FARXIGA) 5 MG TABS tablet Take 5 mg by mouth daily. (Patient taking differently: Take 5 mg by mouth 2 (two) times a day. ) 90 tablet 1  . fluconazole (DIFLUCAN) 150 MG tablet TAKE 1 TABLET BY MOUTH NOW. THEN REPEAT IN 1 WEEK    .  FLUoxetine (PROZAC) 20 MG capsule Take by mouth.    . furosemide (LASIX) 20 MG tablet Take 20 mg by mouth daily as needed for fluid or edema.     Marland Kitchen glucose blood (ACCU-CHEK SMARTVIEW) test strip TEST FOUR TIMES DAILY 150 each 6  . hydrochlorothiazide (HYDRODIURIL) 12.5 MG tablet Take by mouth.    . insulin aspart (NOVOLOG) 100 UNIT/ML injection Inject 0-20 Units into the skin every 4 (four) hours. CBG 70 - 120: 0 units  CBG 121 - 150: 3 units  CBG 151 - 200: 4 units  CBG 201 - 250: 7 units  CBG 251 - 300: 11 units  CBG 301 - 350: 15 units  CBG 351 - 400: 20 units  CBG > 400 call MD (Patient taking differently: Inject 20 Units into the skin every 4 (four) hours. CBG 70 - 120: 0 units  CBG 121 - 150: 3 units  CBG 151 - 200: 4 units  CBG 201 - 250: 7 units  CBG 251 - 300: 11 units  CBG 301 - 350: 15 units  CBG 351 - 400: 20 units  CBG > 400 call MD) 10 mL 11  . insulin aspart protamine - aspart (NOVOLOG MIX 70/30 FLEXPEN) (70-30) 100 UNIT/ML FlexPen Inject into the skin.    . Insulin Glargine (LANTUS) 100 UNIT/ML Solostar Pen Inject 80 Units into the skin 2 (two) times daily. 15 pen 0  . Insulin Pen Needle (B-D UF III MINI PEN NEEDLES) 31G X 5 MM MISC USE AS DIRECTED TO INJECT INSULIN FIVE A DAY E11.65 100 each 6  . JANUVIA 100 MG tablet TAKE 1 TABLET BY MOUTH EVERY DAY 90 tablet 3  . lactulose (CEPHULAC) 10 g packet Take 1 packet (10 g total) by mouth daily as needed. Reported on 07/29/2015 (Patient taking differently: Take 10 g by mouth daily as needed (for constipation). ) 30 each 0  . Lancets (ACCU-CHEK SOFT TOUCH) lancets Use as instructed 100 each 12  . liraglutide (VICTOZA) 18 MG/3ML SOPN Take 0.52m daily for 1 week, then 1.23mdaily  for 1 week, then 1.70m55maily.    . lMarland Kitchenratadine (CLARITIN) 10 MG tablet Take 1 tablet (10 mg total) by mouth 2 (two) times daily. 180 tablet 1  . methocarbamol (ROBAXIN) 500 MG tablet Take 1 tablet (500 mg total) by mouth every 8 (eight) hours as needed. (Patient taking differently: Take 500 mg by mouth every 8 (eight) hours as needed for muscle spasms. ) 60 tablet 1  . mometasone-formoterol (DULERA) 200-5 MCG/ACT AERO Inhale 2 puffs into the lungs 2 (two) times daily. 13 Inhaler 1  . montelukast (SINGULAIR) 10 MG tablet Take 1 tablet (10 mg total) by mouth at bedtime. 90 tablet 1  . nitroGLYCERIN (NITRODUR - DOSED IN MG/24 HR) 0.2 mg/hr patch Apply 1/4th patch to affected achilles, change daily 30 patch 1  . omeprazole (PRILOSEC) 20 MG capsule Take 1 capsule (20 mg total) by mouth daily before breakfast. 90 capsule 2  . topiramate (TOPAMAX) 25 MG tablet Take by mouth.     Facility-Administered Medications Prior to Visit  Medication Dose Route Frequency Provider Last Rate Last Admin  . diphenhydrAMINE (BENADRYL) injection 12.5 mg  12.5 mg Intramuscular Once ZamBernarda CaffeyD        PAST MEDICAL HISTORY: Past Medical History:  Diagnosis Date  . Acute nonintractable headache 12/26/2017  . Arthritis    major joints  . Asthma    prn inhaler  . Asthma exacerbation 07/25/2013  .  Asthma in adult, severe persistent, with acute exacerbation 01/11/2018  . Carpal tunnel syndrome of right wrist 09/2012  . Cataract    NS OU  . Complication of anesthesia    states is hard to wake up post-op  . Constipation 12/30/2014  . Diabetes mellitus    intolerant to Metformin  . Diabetic retinopathy ([place])    NPDR OU  . Dry skin    hands   . Family history of anesthesia complication    mother went into coma during c-section  . Genital herpes 01/01/2006   on acyclovir BID   . GERD (gastroesophageal reflux disease)   . Herpes genital   . Hyperglycemia 12/26/2017  . Hypertensive retinopathy    OU  . Low back  pain 11/22/2012  . Lower extremity edema 10/18/2012  . Menorrhagia 11/06/2015  . Morbid obesity with BMI of 50 to 60 07/23/2009  . Obesity hypoventilation syndrome ([place]) 10/30/2012  . Obesity, morbid ([place]) 01/10/2014  . OSA on CPAP     AHI was on  11-17-12 to 120/hr, titrated to 15 cm with 3 cm EPR/   . PCOS (polycystic ovarian syndrome)   . Rheumatoid arthritis ([place])   . Right ankle pain 06/19/2015  . Right hip pain 09/04/2017  . Right shoulder pain 06/01/2017  . Seasonal allergies   . Sleep apnea, obstructive    uses CPAP nightly - had sleep study 08/22/2007    PAST SURGICAL HISTORY: Past Surgical History:  Procedure Laterality Date  . CARPAL TUNNEL RELEASE Right 10/02/2012   Procedure: RIGHT CARPAL TUNNEL RELEASE ENDOSCOPIC;  Surgeon: Jolyn Nap, MD;  Location: [facility];  Service: Orthopedics;  Laterality: Right;  . CHOLECYSTECTOMY N/A 12/02/2014   Procedure: LAPAROSCOPIC CHOLECYSTECTOMY;  Surgeon: Armandina Gemma, MD;  Location: [facility];  Service: General;  Laterality: N/A;  . HYSTEROSCOPY WITH D & C  01-08-2002    FAMILY HISTORY: Family History  Problem Relation Age of Onset  . Other Mother   . Leukemia Cousin   . Other Other   . Obesity Other   . Other Other   . Diabetes Maternal Aunt   . Heart attack Maternal Aunt   . Alzheimer's disease Maternal Uncle   . Cancer Maternal Uncle        prostate  . Diabetes Paternal Aunt   . Cancer Paternal Aunt        brain,mouth  . Alzheimer's disease Paternal Aunt   . Diabetes Paternal Uncle   . Diabetes Maternal Grandmother   . Diabetes Maternal Grandfather     SOCIAL HISTORY: Social History   Socioeconomic History  . Marital status: Divorced    Spouse name: Not on file  . Number of children: 2  . Years of education: 24  . Highest education level: Not on file  Occupational History  . Occupation: UNEMPLOYED    Employer: UNEMPLOYED  Tobacco Use  . Smoking status: Former Smoker    Years: 10.00    Quit date:  05/24/2010    Years since quitting: 9.5  . Smokeless tobacco: Never Used  Vaping Use  . Vaping Use: Never used  Substance and Sexual Activity  . Alcohol use: Yes    Alcohol/week: 0.0 standard drinks    Comment: socially  . Drug use: No  . Sexual activity: Yes    Birth control/protection: None    Comment: Lives w/a partner  Other Topics Concern  . Not on file  Social History Narrative   Single, but lives  with a partner   Has 2 children and cares for an infant during the day         Social Determinants of Health   Financial Resource Strain:   . Difficulty of Paying Living Expenses: Not on file  Food Insecurity:   . Worried About Charity fundraiser in the Last Year: Not on file  . Ran Out of Food in the Last Year: Not on file  Transportation Needs:   . Lack of Transportation (Medical): Not on file  . Lack of Transportation (Non-Medical): Not on file  Physical Activity:   . Days of Exercise per Week: Not on file  . Minutes of Exercise per Session: Not on file  Stress:   . Feeling of Stress : Not on file  Social Connections:   . Frequency of Communication with Friends and Family: Not on file  . Frequency of Social Gatherings with Friends and Family: Not on file  . Attends Religious Services: Not on file  . Active Member of Clubs or Organizations: Not on file  . Attends Archivist Meetings: Not on file  . Marital Status: Not on file  Intimate Partner Violence:   . Fear of Current or Ex-Partner: Not on file  . Emotionally Abused: Not on file  . Physically Abused: Not on file  . Sexually Abused: Not on file      PHYSICAL EXAM  There were no vitals filed for this visit. There is no height or weight on file to calculate BMI.  Generalized: Well developed, in no acute distress  Cardiology: normal rate and rhythm, no murmur noted Respiratory: clear to auscultation bilaterally  Neurological examination  Mentation: Alert oriented to time, place, history taking.  Follows all commands speech and language fluent Cranial nerve II-XII: Pupils were equal round reactive to light. Extraocular movements were full, visual field were full Motor: The motor testing reveals 5 over 5 strength of all 4 extremities. Good symmetric motor tone is noted throughout.  Gait and station: Gait is normal.    DIAGNOSTIC DATA (LABS, IMAGING, TESTING) - I reviewed patient records, labs, notes, testing and imaging myself where available.  No flowsheet data found.   Lab Results  Component Value Date   WBC 12.7 (H) 09/02/2018   HGB 12.4 09/02/2018   HCT 40.9 09/02/2018   MCV 81.2 09/02/2018   PLT 335 09/02/2018      Component Value Date/Time   NA 138 09/02/2018 0540   NA 139 10/21/2016 1525   K 3.6 09/02/2018 0540   CL 101 09/02/2018 0540   CO2 26 09/02/2018 0540   GLUCOSE 179 (H) 09/02/2018 0540   BUN 18 09/02/2018 0540   BUN 6 10/21/2016 1525   CREATININE 0.62 09/02/2018 0540   CREATININE 0.60 07/24/2014 1550   CALCIUM 8.7 (L) 09/02/2018 0540   PROT 6.8 09/02/2018 0540   PROT 6.9 12/30/2014 1641   ALBUMIN 3.0 (L) 09/02/2018 0540   ALBUMIN 3.9 12/30/2014 1641   AST 13 (L) 09/02/2018 0540   ALT 17 09/02/2018 0540   ALKPHOS 40 09/02/2018 0540   BILITOT 0.3 09/02/2018 0540   BILITOT 0.3 12/30/2014 1641   GFRNONAA >60 09/02/2018 0540   GFRNONAA >89 07/24/2014 1550   GFRAA >60 09/02/2018 0540   GFRAA >89 07/24/2014 1550   Lab Results  Component Value Date   CHOL 148 07/24/2014   HDL 41 (L) 07/24/2014   LDLCALC 85 07/24/2014   TRIG 112 07/24/2014   CHOLHDL 3.6 07/24/2014  Lab Results  Component Value Date   HGBA1C 12.0 (H) 08/29/2018   Lab Results  Component Value Date   [id number] 612 02/19/2008   Lab Results  Component Value Date   TSH 1.040 11/16/2017       ASSESSMENT AND PLAN 44 y.o. year old female  has a past medical history of Acute nonintractable headache (12/26/2017), Arthritis, Asthma, Asthma exacerbation (07/25/2013), Asthma in  adult, severe persistent, with acute exacerbation (01/11/2018), Carpal tunnel syndrome of right wrist (09/2012), Cataract, Complication of anesthesia, Constipation (12/30/2014), Diabetes mellitus, Diabetic retinopathy ([place]), Dry skin, Family history of anesthesia complication, Genital herpes (01/01/2006), GERD (gastroesophageal reflux disease), Herpes genital, Hyperglycemia (12/26/2017), Hypertensive retinopathy, Low back pain (11/22/2012), Lower extremity edema (10/18/2012), Menorrhagia (11/06/2015), Morbid obesity with BMI of 50 to 60 (07/23/2009), Obesity hypoventilation syndrome ([place]) (10/30/2012), Obesity, morbid ([place]) (01/10/2014), OSA on CPAP, PCOS (polycystic ovarian syndrome), Rheumatoid arthritis ([place]), Right ankle pain (06/19/2015), Right hip pain (09/04/2017), Right shoulder pain (06/01/2017), Seasonal allergies, and Sleep apnea, obstructive. here with   No diagnosis found.   Compliance report reveals acceptable daily compliance with suboptimal 4 hour compliance. AHI is elevated at 27.9/hr. We will adjust pressure setting to 5-16cmH20. I will repeat download in 4-6 weeks to assess response. She was advised to monitor for correct mask seal at home. May consider mask refitting if needed. Healthy lifestyle habits encouraged. She will return to see me in 3 months. She verbalizes understanding and agreement with this plan.    No orders of the defined types were placed in this encounter.    No orders of the defined types were placed in this encounter.     I spent 15 minutes with the patient. 50% of this time was spent counseling and educating patient on plan of care and medications.    Debbora Presto, FNP-C 11/21/2019, 7:37 AM [facility] [address], [place], [place] [address] [phone]

## 2019-12-05 NOTE — Progress Notes (Addendum)
[facility] Note  12/07/2019     CHIEF COMPLAINT Patient presents for Retina Follow Up   HISTORY OF PRESENT ILLNESS: Ana Long is a 44 y.o. female who presents to the clinic today for:   HPI    Retina Follow Up    Patient presents with  Diabetic Retinopathy.  In both eyes.  Duration of 3 months.  Since onset it is stable.  I, the attending physician,  performed the HPI with the patient and updated documentation appropriately.          Comments    3 month follow up NPDR OU- Once and awhile her vision will get blurry then clear up.  Difficulties reading with glasses (no bifocal).   BS: 213 last night, she forgot to check this am and did not take her insulin. A1C: 9.7       Last edited by Bernarda Caffey, MD on 12/07/2019 12:43 PM. (History)    pt is delayed to follow up from 4 weeks to 3.5 months, pt state she just forgot about her last appt, pt states she think she needs new glasses  Referring physician: [name] MD [place], [place] [id number]  HISTORICAL INFORMATION:   Selected notes from the [place] Re-referred by Dr. [name] for DM exam    CURRENT MEDICATIONS: No current outpatient medications on file. (Ophthalmic Drugs)   No current facility-administered medications for this visit. (Ophthalmic Drugs)   Current Outpatient Medications (Other)  Medication Sig  . acyclovir (ZOVIRAX) 400 MG tablet TAKE 1 TABLET BY MOUTH TWICE A DAY  . albuterol (PROAIR HFA) 108 (90 Base) MCG/ACT inhaler Inhale 2 puffs into the lungs every 4 (four) hours as needed for wheezing or shortness of breath (cough). (Patient taking differently: Inhale 2 puffs into the lungs every 4 (four) hours as needed for wheezing or shortness of breath (or coughing). )  . atorvastatin (LIPITOR) 40 MG tablet Take 1 tablet (40 mg total) by mouth at bedtime.  . benzonatate (TESSALON) 200 MG capsule Take 1 capsule (200 mg total) by mouth 3  (three) times daily as needed for cough.  . dapagliflozin propanediol (FARXIGA) 5 MG TABS tablet Take 5 mg by mouth daily. (Patient taking differently: Take 5 mg by mouth 2 (two) times a day. )  . fluconazole (DIFLUCAN) 150 MG tablet TAKE 1 TABLET BY MOUTH NOW. THEN REPEAT IN 1 WEEK  . FLUoxetine (PROZAC) 20 MG capsule Take by mouth.  . furosemide (LASIX) 20 MG tablet Take 20 mg by mouth daily as needed for fluid or edema.   . hydrochlorothiazide (HYDRODIURIL) 12.5 MG tablet Take by mouth.  . insulin aspart (NOVOLOG) 100 UNIT/ML injection Inject 0-20 Units into the skin every 4 (four) hours. CBG 70 - 120: 0 units  CBG 121 - 150: 3 units  CBG 151 - 200: 4 units  CBG 201 - 250: 7 units  CBG 251 - 300: 11 units  CBG 301 - 350: 15 units  CBG 351 - 400: 20 units  CBG > 400 call MD (Patient taking differently: Inject 20 Units into the skin every 4 (four) hours. CBG 70 - 120: 0 units  CBG 121 - 150: 3 units  CBG 151 - 200: 4 units  CBG 201 - 250: 7 units  CBG 251 - 300: 11 units  CBG 301 - 350: 15 units  CBG 351 - 400: 20 units  CBG > 400 call  MD)  . insulin aspart protamine - aspart (NOVOLOG MIX 70/30 FLEXPEN) (70-30) 100 UNIT/ML FlexPen Inject into the skin.  Marland Kitchen JANUVIA 100 MG tablet TAKE 1 TABLET BY MOUTH EVERY DAY  . lactulose (CEPHULAC) 10 g packet Take 1 packet (10 g total) by mouth daily as needed. Reported on 07/29/2015 (Patient taking differently: Take 10 g by mouth daily as needed (for constipation). )  . liraglutide (VICTOZA) 18 MG/3ML SOPN Take 0.50m daily for 1 week, then 1.229mdaily for 1 week, then 1.49m64maily.  . lMarland Kitchenratadine (CLARITIN) 10 MG tablet Take 1 tablet (10 mg total) by mouth 2 (two) times daily.  . methocarbamol (ROBAXIN) 500 MG tablet Take 1 tablet (500 mg total) by mouth every 8 (eight) hours as needed. (Patient taking differently: Take 500 mg by mouth every 8 (eight) hours as needed for muscle spasms. )  . mometasone-formoterol (DULERA) 200-5 MCG/ACT AERO Inhale 2 puffs  into the lungs 2 (two) times daily.  . montelukast (SINGULAIR) 10 MG tablet Take 1 tablet (10 mg total) by mouth at bedtime.  . nitroGLYCERIN (NITRODUR - DOSED IN MG/24 HR) 0.2 mg/hr patch Apply 1/4th patch to affected achilles, change daily  . omeprazole (PRILOSEC) 20 MG capsule Take 1 capsule (20 mg total) by mouth daily before breakfast.  . topiramate (TOPAMAX) 25 MG tablet Take by mouth.  . Blood Glucose Monitoring Suppl (ACCU-CHEK AVIVA PLUS) w/Device KIT Please check you blood sugars 4 times a day  . glucose blood (ACCU-CHEK SMARTVIEW) test strip TEST FOUR TIMES DAILY  . Insulin Glargine (LANTUS) 100 UNIT/ML Solostar Pen Inject 80 Units into the skin 2 (two) times daily.  . Insulin Pen Needle (B-D UF III MINI PEN NEEDLES) 31G X 5 MM MISC USE AS DIRECTED TO INJECT INSULIN FIVE A DAY E11.65  . Lancets (ACCU-CHEK SOFT TOUCH) lancets Use as instructed   Current Facility-Administered Medications (Other)  Medication Route  . diphenhydrAMINE (BENADRYL) injection 12.5 mg Intramuscular      REVIEW OF SYSTEMS: ROS    Positive for: Gastrointestinal, Musculoskeletal, Endocrine, Eyes, Respiratory   Negative for: Constitutional, Neurological, Skin, Genitourinary, HENT, Cardiovascular, Psychiatric, Allergic/Imm, Heme/Lymph   Last edited by HodLeonie DouglasOA on 12/07/2019  9:55 AM. (History)       ALLERGIES Allergies  Allergen Reactions  . Bee Venom Anaphylaxis and Swelling  . Wasp Venom Anaphylaxis and Swelling  . Citrus Hives and Itching  . Latex Hives and Swelling  . Penicillins Hives and Itching    Has patient had a PCN reaction causing immediate rash, facial/tongue/throat swelling, SOB or lightheadedness with hypotension: Yes Has patient had a PCN reaction causing severe rash involving mucus membranes or skin necrosis: Unk Has patient had a PCN reaction that required hospitalization: Was already in hosp Has patient had a PCN reaction occurring within the last 10 years: No If all of  the above answers are "NO", then may proceed with Cephalosporin use.   . Shellfish Allergy Other (See Comments) and Swelling    Patient is uncertain; stated that she tolerates this (??)  . Soap Hives, Itching and Swelling    PUREX LAUNDRY DETERGENT  . Tomato Hives and Itching  . Banana Itching and Nausea Only  . Chocolate Itching  . Fluticasone Other (See Comments)    Per patient, makes sneeze and gag  . Hydrocodone Itching and Nausea Only  . Other Itching    Acidic foods- Itching [place]- Itching in the roof of the mouth  . Adhesive [Tape] Itching and Rash  Allergic to  "leads"  . Doxycycline Other (See Comments)    AFFECTS JOINTS  . Metformin And Related Nausea Only, Rash, Other (See Comments) and Diarrhea    GI UPSET, also  . Red Dye Itching, Rash and Other (See Comments)    At eye doctor's office, the dilating drops- Skin breaks out, chest turned red, itching, face became swollen    PAST MEDICAL HISTORY Past Medical History:  Diagnosis Date  . Acute nonintractable headache 12/26/2017  . Arthritis    major joints  . Asthma    prn inhaler  . Asthma exacerbation 07/25/2013  . Asthma in adult, severe persistent, with acute exacerbation 01/11/2018  . Carpal tunnel syndrome of right wrist 09/2012  . Cataract    NS OU  . Complication of anesthesia    states is hard to wake up post-op  . Constipation 12/30/2014  . Diabetes mellitus    intolerant to Metformin  . Diabetic retinopathy ([place])    NPDR OU  . Dry skin    hands   . Family history of anesthesia complication    mother went into coma during c-section  . Genital herpes 01/01/2006   on acyclovir BID   . GERD (gastroesophageal reflux disease)   . Herpes genital   . Hyperglycemia 12/26/2017  . Hypertensive retinopathy    OU  . Low back pain 11/22/2012  . Lower extremity edema 10/18/2012  . Menorrhagia 11/06/2015  . Morbid obesity with BMI of 50 to 60 07/23/2009  . Obesity hypoventilation syndrome ([place]) 10/30/2012  .  Obesity, morbid ([place]) 01/10/2014  . OSA on CPAP     AHI was on  11-17-12 to 120/hr, titrated to 15 cm with 3 cm EPR/   . PCOS (polycystic ovarian syndrome)   . Rheumatoid arthritis ([place])   . Right ankle pain 06/19/2015  . Right hip pain 09/04/2017  . Right shoulder pain 06/01/2017  . Seasonal allergies   . Sleep apnea, obstructive    uses CPAP nightly - had sleep study 08/22/2007   Past Surgical History:  Procedure Laterality Date  . CARPAL TUNNEL RELEASE Right 10/02/2012   Procedure: RIGHT CARPAL TUNNEL RELEASE ENDOSCOPIC;  Surgeon: Jolyn Nap, MD;  Location: [facility];  Service: Orthopedics;  Laterality: Right;  . CHOLECYSTECTOMY N/A 12/02/2014   Procedure: LAPAROSCOPIC CHOLECYSTECTOMY;  Surgeon: Armandina Gemma, MD;  Location: [facility];  Service: General;  Laterality: N/A;  . HYSTEROSCOPY WITH D & C  01-08-2002    FAMILY HISTORY Family History  Problem Relation Age of Onset  . Other Mother   . Leukemia Cousin   . Other Other   . Obesity Other   . Other Other   . Diabetes Maternal Aunt   . Heart attack Maternal Aunt   . Alzheimer's disease Maternal Uncle   . Cancer Maternal Uncle        prostate  . Diabetes Paternal Aunt   . Cancer Paternal Aunt        brain,mouth  . Alzheimer's disease Paternal Aunt   . Diabetes Paternal Uncle   . Diabetes Maternal Grandmother   . Diabetes Maternal Grandfather     SOCIAL HISTORY Social History   Tobacco Use  . Smoking status: Former Smoker    Years: 10.00    Quit date: 05/24/2010    Years since quitting: 9.5  . Smokeless tobacco: Never Used  Vaping Use  . Vaping Use: Never used  Substance Use Topics  . Alcohol use: Yes  Alcohol/week: 0.0 standard drinks    Comment: socially  . Drug use: No         OPHTHALMIC EXAM:  Base Eye Exam    Visual Acuity (Snellen - Linear)      Right Left   Dist cc 20/30 20/30   Dist ph cc 20/25 20/30 +2   Correction: Glasses       Tonometry (Tonopen, 10:08 AM)      Right  Left   Pressure 14 13       Pupils      Dark Light Shape React APD   Right 3 2 Round Brisk None   Left 3 2 Round Brisk None       Visual Fields (Counting fingers)      Left Right    Full Full       Extraocular Movement      Right Left    Full Full       Neuro/Psych    Oriented x3: Yes   Mood/Affect: Normal       Dilation    Both eyes: 1.0% Mydriacyl, 2.5% Phenylephrine @ 10:08 AM        Slit Lamp and Fundus Exam    Slit Lamp Exam      Right Left   Lids/Lashes Dermatochalasis - upper lid, mild Meibomian gland dysfunction Dermatochalasis - upper lid, mild Meibomian gland dysfunction   Conjunctiva/Sclera White and quiet White and quiet   Cornea trace Punctate epithelial erosions 1-2+Punctate epithelial erosions   Anterior Chamber deep and quiet deep and quiet   Iris round and dilated, No NVI round and dilated   Lens 1+ NS, 1+CC 1+NS, 1+CC   Vitreous mild vitreous syneresis mild vitreous syneresis       Fundus Exam      Right Left   Disc Pink and sharp Pink and sharp   C/D Ratio 0.6 0.6   Macula good foveal reflex, focal exudates inferior to fovea and scattered superior macula, scattered MA blunted foveal reflex, scatttered MA's and exudate, +cystic changes nasal fovea, focal cluster of exudates temporal macula   Vessels Vascular attenuation, Tortuous, Copper wiring Vascular attenuation, Tortuous, Copper wiring   Periphery Attached, scattered MA greatest nasally -- improved Attached, scattered MA and exudate greatest superior to disc, white without pressure temporally        Refraction    Wearing Rx      Sphere Cylinder Axis   Right -2.50 +1.50 013   Left -2.25 Sphere    Type: SVL       Manifest Refraction      Sphere Cylinder Axis Dist VA   Right -3.00 +1.50 005 20/30+1   Left -2.75 +0.75 060 20/30+1          IMAGING AND PROCEDURES  Imaging and Procedures for _0 @  OCT, Retina - OU - Both Eyes       Right Eye Quality was good. Central  Foveal Thickness: 223. Progression has improved. Findings include normal foveal contour, no SRF, intraretinal fluid, intraretinal hyper-reflective material (Mild interval improvement in IRF/IRHM).   Left Eye Quality was good. Central Foveal Thickness: 260. Progression has worsened. Findings include intraretinal fluid, no SRF, abnormal foveal contour, intraretinal hyper-reflective material (Interval increase in IRF nasal fovea, but interval improvement in IRF IT macula).   Notes *Images captured and stored on drive  Diagnosis / Impression:  +DME OU-- persistent IRF/IRHM OD: Mild interval improvement in IRF/IRHM OS: Interval increase in IRF nasal fovea, but interval improvement  in IRF IT macula  Clinical management:  See below  Abbreviations: NFP - Normal foveal profile. CME - cystoid macular edema. PED - pigment epithelial detachment. IRF - intraretinal fluid. SRF - subretinal fluid. EZ - ellipsoid zone. ERM - epiretinal membrane. ORA - outer retinal atrophy. ORT - outer retinal tubulation. SRHM - subretinal hyper-reflective material        Intravitreal Injection, Pharmacologic Agent - OD - Right Eye       Time Out 12/07/2019. 10:40 AM. Confirmed correct patient, procedure, site, and patient consented.   Anesthesia Topical anesthesia was used. Anesthetic medications included Lidocaine 2%, Proparacaine 0.5%.   Procedure Preparation included 5% betadine to ocular surface, eyelid speculum. A supplied (32g) needle was used.   Injection:  1.25 mg Bevacizumab (AVASTIN) SOLN   NDC: 09326-712-45, Lot: [id number] , Expiration date: 12/31/2019   Route: Intravitreal, Site: Right Eye, Waste: 0 mL  Post-op Post injection exam found visual acuity of at least counting fingers. The patient tolerated the procedure well. There were no complications. The patient received written and verbal post procedure care education.        Intravitreal Injection, Pharmacologic Agent - OS - Left Eye        Time Out 12/07/2019. 10:40 AM. Confirmed correct patient, procedure, site, and patient consented.   Anesthesia Topical anesthesia was used. Anesthetic medications included Lidocaine 2%, Proparacaine 0.5%.   Procedure Preparation included 5% betadine to ocular surface, eyelid speculum. A (32g) needle was used.   Injection:  1.25 mg Bevacizumab (AVASTIN) SOLN   NDC: 80998-338-25, Lot: [id number], Expiration date: 12/30/2019   Route: Intravitreal, Site: Left Eye, Waste: 0.05 mL  Post-op Post injection exam found visual acuity of at least counting fingers. The patient tolerated the procedure well. There were no complications. The patient received written and verbal post procedure care education.                 ASSESSMENT/PLAN:    ICD-10-CM   1. Moderate nonproliferative diabetic retinopathy of both eyes with macular edema associated with type 2 diabetes mellitus (HCC)  K53.9767 Intravitreal Injection, Pharmacologic Agent - OD - Right Eye    Intravitreal Injection, Pharmacologic Agent - OS - Left Eye    Bevacizumab (AVASTIN) SOLN 1.25 mg    Bevacizumab (AVASTIN) SOLN 1.25 mg  2. Retinal edema  H35.81 OCT, Retina - OU - Both Eyes  3. Essential hypertension  I10   4. Hypertensive retinopathy of both eyes  H35.033   5. Nuclear sclerosis of both eyes  H25.13     1,2. Moderate non-proliferative diabetic retinopathy w/ DME, OU  - delayed f/u from 4 wks to 3.5 mos (07.02.21 to 10.15.21) -- discussed importance of regular follow up and treatments  - pt lost to f/u from 10.23.2019 to 11.18.20  - FA in 2019 showed leaking MA OU; no NV OU  **of note, pt had pruritic rxn to fluorescein dye -- resolved with 12.5 mg IV benadryl**  - FA 12.16.2020 shows late leaking MA's, non-perfusion defects; no NV OU  - s/p IVA OD #1 (12.16.20), #2 (01.25.21), #3 (05.26.21), #4 (07.02.21)  - s/p focal laser OD (03.19.21)  - s/p IVA OS #1 OS (12.23.20), #2 (01.27.21), #3 (03.02.21), #4 (05.26.21),#5  (07.02.21)  - exam shows scattered MA, IRH, exudates and mild macular edema OU  - OCT shows interval improvement in IRF/IRHM OD; OS: interval increase in IRF nasal fovea, but interval improvement in IRF IT macula   - BCVA OD stable at 20/25; OS  decreased to 20/30 from 20/25             - Recommend IVA #5 OD and #6 OS today, 10.15.21             - Risks and benefits of tx discussed w/pt             - Pt wishes to proceed w/tx today             - informed consent obtained  - Avastin informed consent form signed and scanned on 01.27.2021             - see procedure note  - f/u 4-6 weeks, DFE, OCT  3,4. Hypertensive retinopathy OU  - discussed importance of tight BP control  - monitor  5. Nuclear Sclerosis   - The symptoms of cataract, surgical options, and treatments and risks were discussed with patient.  - discussed diagnosis and progression  - under the expert management of [place] Ordered this visit:  Meds ordered this encounter  Medications  . Bevacizumab (AVASTIN) SOLN 1.25 mg  . Bevacizumab (AVASTIN) SOLN 1.25 mg       Return for f/u NPDR OU, DFE, OCT.  There are no Patient Instructions on file for this visit.   Explained the diagnoses, plan, and follow up with the patient and they expressed understanding.  Patient expressed understanding of the importance of proper follow up care.  This document serves as a record of services personally performed by Gardiner Sleeper, MD, PhD. It was created on their behalf by San Jetty. Owens Shark, OA an ophthalmic technician. The creation of this record is the provider's dictation and/or activities during the visit.    Electronically signed by: San Jetty. [place], [place] 10.13.2021 12:48 PM.  Gardiner Sleeper, M.D., Ph.D. Diseases & Surgery of the Retina and Vitreous [facility]  I have reviewed the above documentation for accuracy and completeness, and I agree with the above. Gardiner Sleeper, M.D.,  Ph.D. 12/07/19 12:48 PM   Abbreviations: M myopia (nearsighted); A astigmatism; H hyperopia (farsighted); P presbyopia; Mrx spectacle prescription;  CTL contact lenses; OD right eye; OS left eye; OU both eyes  XT exotropia; ET esotropia; PEK punctate epithelial keratitis; PEE punctate epithelial erosions; DES dry eye syndrome; MGD meibomian gland dysfunction; ATs artificial tears; PFAT's preservative free artificial tears; [place] nuclear sclerotic cataract; PSC posterior subcapsular cataract; ERM epi-retinal membrane; PVD posterior vitreous detachment; RD retinal detachment; DM diabetes mellitus; DR diabetic retinopathy; NPDR non-proliferative diabetic retinopathy; PDR proliferative diabetic retinopathy; CSME clinically significant macular edema; DME diabetic macular edema; dbh dot blot hemorrhages; CWS cotton wool spot; POAG primary open angle glaucoma; C/D cup-to-disc ratio; HVF humphrey visual field; GVF goldmann visual field; OCT optical coherence tomography; IOP intraocular pressure; BRVO Branch retinal vein occlusion; CRVO central retinal vein occlusion; CRAO central retinal artery occlusion; BRAO branch retinal artery occlusion; RT retinal tear; SB scleral buckle; PPV pars plana vitrectomy; VH Vitreous hemorrhage; PRP panretinal laser photocoagulation; IVK intravitreal kenalog; VMT vitreomacular traction; MH Macular hole;  NVD neovascularization of the disc; NVE neovascularization elsewhere; AREDS age related eye disease study; ARMD age related macular degeneration; POAG primary open angle glaucoma; EBMD epithelial/anterior basement membrane dystrophy; ACIOL anterior chamber intraocular lens; IOL intraocular lens; PCIOL posterior chamber intraocular lens; Phaco/IOL phacoemulsification with intraocular lens placement; [place] photorefractive keratectomy; LASIK laser assisted in situ keratomileusis; HTN hypertension; DM diabetes mellitus; COPD chronic obstructive pulmonary disease

## 2019-12-07 ENCOUNTER — Ambulatory Visit (INDEPENDENT_AMBULATORY_CARE_PROVIDER_SITE_OTHER): Payer: Medicaid Other | Admitting: Ophthalmology

## 2019-12-07 ENCOUNTER — Other Ambulatory Visit: Payer: Self-pay

## 2019-12-07 ENCOUNTER — Encounter (INDEPENDENT_AMBULATORY_CARE_PROVIDER_SITE_OTHER): Payer: Self-pay | Admitting: Ophthalmology

## 2019-12-07 DIAGNOSIS — H2513 Age-related nuclear cataract, bilateral: Secondary | ICD-10-CM

## 2019-12-07 DIAGNOSIS — H35033 Hypertensive retinopathy, bilateral: Secondary | ICD-10-CM

## 2019-12-07 DIAGNOSIS — H3581 Retinal edema: Secondary | ICD-10-CM

## 2019-12-07 DIAGNOSIS — E113313 Type 2 diabetes mellitus with moderate nonproliferative diabetic retinopathy with macular edema, bilateral: Secondary | ICD-10-CM | POA: Diagnosis not present

## 2019-12-07 DIAGNOSIS — I10 Essential (primary) hypertension: Secondary | ICD-10-CM

## 2019-12-07 MED ORDER — BEVACIZUMAB CHEMO INJECTION 1.25MG/0.05ML SYRINGE FOR KALEIDOSCOPE
1.2500 mg | INTRAVITREAL | Status: AC | PRN
  Administered 2019-12-07: 1.25 mg via INTRAVITREAL

## 2019-12-27 ENCOUNTER — Telehealth: Payer: Self-pay | Admitting: Family Medicine

## 2019-12-27 NOTE — Telephone Encounter (Signed)
Can you confirm receipt of my last DME order with DME. I received a system alert that it was canceled. TY.

## 2020-01-10 NOTE — Addendum Note (Signed)
Addended byUbaldo Glassing, Jager Koska L on: 01/10/2020 10:30 AM   Modules accepted: Orders

## 2020-01-11 ENCOUNTER — Encounter (INDEPENDENT_AMBULATORY_CARE_PROVIDER_SITE_OTHER): Payer: Medicaid Other | Admitting: Ophthalmology

## 2020-01-11 DIAGNOSIS — H2513 Age-related nuclear cataract, bilateral: Secondary | ICD-10-CM

## 2020-01-11 DIAGNOSIS — H35033 Hypertensive retinopathy, bilateral: Secondary | ICD-10-CM

## 2020-01-11 DIAGNOSIS — E113313 Type 2 diabetes mellitus with moderate nonproliferative diabetic retinopathy with macular edema, bilateral: Secondary | ICD-10-CM

## 2020-01-11 DIAGNOSIS — I10 Essential (primary) hypertension: Secondary | ICD-10-CM

## 2020-01-11 DIAGNOSIS — H3581 Retinal edema: Secondary | ICD-10-CM

## 2020-01-14 NOTE — Progress Notes (Signed)
[facility] Note  01/15/2020     CHIEF COMPLAINT Patient presents for Retina Follow Up   HISTORY OF PRESENT ILLNESS: Ana Long is a 44 y.o. female who presents to the clinic today for:   HPI    Retina Follow Up    Patient presents with  Diabetic Retinopathy.  In both eyes.  This started months ago.  Severity is moderate.  Duration of 6 weeks.  Since onset it is stable.  I, the attending physician,  performed the HPI with the patient and updated documentation appropriately.          Comments    44 y/o female pt pt here for 6 wk f/u for mod NPDR w/mac edema OU.  No change in [place] OU.  Denies pain, FOL, floaters.  OTC allergy gtts prn OU.  BS 294 1.5 wks ago.  A1C 9.7.       Last edited by Bernarda Caffey, MD on 01/15/2020  1:48 PM. (History)    pt   Referring physician: [name] MD [address] [place], [place] [id number]  HISTORICAL INFORMATION:   Selected notes from the [place] [facility]-referred by Dr. [name] for DM exam    CURRENT MEDICATIONS: Current Outpatient Medications (Ophthalmic Drugs)  Medication Sig  . Olopatadine HCl 0.2 % SOLN Place one drop into both eyes daily.   No current facility-administered medications for this visit. (Ophthalmic Drugs)   Current Outpatient Medications (Other)  Medication Sig  . acyclovir (ZOVIRAX) 400 MG tablet TAKE 1 TABLET BY MOUTH TWICE A DAY  . albuterol (PROAIR HFA) 108 (90 Base) MCG/ACT inhaler Inhale 2 puffs into the lungs every 4 (four) hours as needed for wheezing or shortness of breath (cough). (Patient taking differently: Inhale 2 puffs into the lungs every 4 (four) hours as needed for wheezing or shortness of breath (or coughing). )  . atorvastatin (LIPITOR) 40 MG tablet Take 1 tablet (40 mg total) by mouth at bedtime.  . benzonatate (TESSALON) 200 MG capsule Take 1 capsule (200 mg total) by mouth 3 (three) times daily as needed for cough.  . Blood Glucose Monitoring  Suppl (ACCU-CHEK AVIVA PLUS) w/Device KIT Please check you blood sugars 4 times a day  . dapagliflozin propanediol (FARXIGA) 5 MG TABS tablet Take 5 mg by mouth daily. (Patient taking differently: Take 5 mg by mouth 2 (two) times a day. )  . DERMA-SMOOTHE/FS SCALP 0.01 % OIL Apply topically.  . fluconazole (DIFLUCAN) 150 MG tablet TAKE 1 TABLET BY MOUTH NOW. THEN REPEAT IN 1 WEEK  . FLUoxetine (PROZAC) 20 MG capsule Take by mouth.  . furosemide (LASIX) 20 MG tablet Take 20 mg by mouth daily as needed for fluid or edema.   Marland Kitchen glucose blood (ACCU-CHEK SMARTVIEW) test strip TEST FOUR TIMES DAILY  . hydrochlorothiazide (HYDRODIURIL) 12.5 MG tablet Take by mouth.  . insulin aspart (NOVOLOG) 100 UNIT/ML injection Inject 0-20 Units into the skin every 4 (four) hours. CBG 70 - 120: 0 units  CBG 121 - 150: 3 units  CBG 151 - 200: 4 units  CBG 201 - 250: 7 units  CBG 251 - 300: 11 units  CBG 301 - 350: 15 units  CBG 351 - 400: 20 units  CBG > 400 call MD (Patient taking differently: Inject 20 Units into the skin every 4 (four) hours. CBG 70 - 120: 0 units  CBG 121 - 150: 3 units  CBG 151 - 200:  4 units  CBG 201 - 250: 7 units  CBG 251 - 300: 11 units  CBG 301 - 350: 15 units  CBG 351 - 400: 20 units  CBG > 400 call MD)  . insulin aspart protamine - aspart (NOVOLOG MIX 70/30 FLEXPEN) (70-30) 100 UNIT/ML FlexPen Inject into the skin.  . Insulin Pen Needle (B-D UF III MINI PEN NEEDLES) 31G X 5 MM MISC USE AS DIRECTED TO INJECT INSULIN FIVE A DAY E11.65  . JANUVIA 100 MG tablet TAKE 1 TABLET BY MOUTH EVERY DAY  . lactulose (CEPHULAC) 10 g packet Take 1 packet (10 g total) by mouth daily as needed. Reported on 07/29/2015 (Patient taking differently: Take 10 g by mouth daily as needed (for constipation). )  . Lancets (ACCU-CHEK SOFT TOUCH) lancets Use as instructed  . liraglutide (VICTOZA) 18 MG/3ML SOPN Take 0.79m daily for 1 week, then 1.229mdaily for 1 week, then 1.56m3maily.  . lMarland Kitchenratadine (CLARITIN) 10  MG tablet Take 1 tablet (10 mg total) by mouth 2 (two) times daily.  . methocarbamol (ROBAXIN) 500 MG tablet Take 1 tablet (500 mg total) by mouth every 8 (eight) hours as needed. (Patient taking differently: Take 500 mg by mouth every 8 (eight) hours as needed for muscle spasms. )  . mometasone (NASONEX) 50 MCG/ACT nasal spray Place into the nose.  . mometasone-formoterol (DULERA) 200-5 MCG/ACT AERO Inhale 2 puffs into the lungs 2 (two) times daily.  . montelukast (SINGULAIR) 10 MG tablet Take 1 tablet (10 mg total) by mouth at bedtime.  . nitroGLYCERIN (NITRODUR - DOSED IN MG/24 HR) 0.2 mg/hr patch Apply 1/4th patch to affected achilles, change daily  . omeprazole (PRILOSEC) 20 MG capsule Take 1 capsule (20 mg total) by mouth daily before breakfast.  . SPIRIVA RESPIMAT 1.25 MCG/ACT AERS SMARTSIG:2 Puff(s) Via Inhaler Daily  . topiramate (TOPAMAX) 25 MG tablet Take by mouth.  . Insulin Glargine (LANTUS) 100 UNIT/ML Solostar Pen Inject 80 Units into the skin 2 (two) times daily.   Current Facility-Administered Medications (Other)  Medication Route  . diphenhydrAMINE (BENADRYL) injection 12.5 mg Intramuscular      REVIEW OF SYSTEMS: ROS    Positive for: Gastrointestinal, Musculoskeletal, Endocrine, Eyes, Respiratory   Negative for: Constitutional, Neurological, Skin, Genitourinary, HENT, Cardiovascular, Psychiatric, Allergic/Imm, Heme/Lymph   Last edited by BaxMatthew FolksOA on 01/15/2020  1:16 PM. (History)       ALLERGIES Allergies  Allergen Reactions  . Bee Venom Anaphylaxis and Swelling  . Wasp Venom Anaphylaxis and Swelling  . Citrus Hives and Itching  . Latex Hives and Swelling  . Penicillins Hives and Itching    Has patient had a PCN reaction causing immediate rash, facial/tongue/throat swelling, SOB or lightheadedness with hypotension: Yes Has patient had a PCN reaction causing severe rash involving mucus membranes or skin necrosis: Unk Has patient had a PCN reaction  that required hospitalization: Was already in hosp Has patient had a PCN reaction occurring within the last 10 years: No If all of the above answers are "NO", then may proceed with Cephalosporin use.   . Shellfish Allergy Other (See Comments) and Swelling    Patient is uncertain; stated that she tolerates this (??)  . Soap Hives, Itching and Swelling    PUREX LAUNDRY DETERGENT  . Tomato Hives and Itching  . Banana Itching and Nausea Only  . Chocolate Itching  . Fluticasone Other (See Comments)    Per patient, makes sneeze and gag  . Hydrocodone Itching  and Nausea Only  . Other Itching    Acidic foods- Itching [place]- Itching in the roof of the mouth  . Adhesive [Tape] Itching and Rash    Allergic to  "leads"  . Doxycycline Other (See Comments)    AFFECTS JOINTS  . Metformin And Related Nausea Only, Rash, Other (See Comments) and Diarrhea    GI UPSET, also  . Red Dye Itching, Rash and Other (See Comments)    At eye doctor's office, the dilating drops- Skin breaks out, chest turned red, itching, face became swollen    PAST MEDICAL HISTORY Past Medical History:  Diagnosis Date  . Acute nonintractable headache 12/26/2017  . Arthritis    major joints  . Asthma    prn inhaler  . Asthma exacerbation 07/25/2013  . Asthma in adult, severe persistent, with acute exacerbation 01/11/2018  . Carpal tunnel syndrome of right wrist 09/2012  . Cataract    NS OU  . Complication of anesthesia    states is hard to wake up post-op  . Constipation 12/30/2014  . Diabetes mellitus    intolerant to Metformin  . Diabetic retinopathy ([place])    NPDR OU  . Dry skin    hands   . Family history of anesthesia complication    mother went into coma during c-section  . Genital herpes 01/01/2006   on acyclovir BID   . GERD (gastroesophageal reflux disease)   . Herpes genital   . Hyperglycemia 12/26/2017  . Hypertensive retinopathy    OU  . Low back pain 11/22/2012  . Lower extremity edema 10/18/2012   . Menorrhagia 11/06/2015  . Morbid obesity with BMI of 50 to 60 07/23/2009  . Obesity hypoventilation syndrome ([place]) 10/30/2012  . Obesity, morbid ([place]) 01/10/2014  . OSA on CPAP     AHI was on  11-17-12 to 120/hr, titrated to 15 cm with 3 cm EPR/   . PCOS (polycystic ovarian syndrome)   . Rheumatoid arthritis ([place])   . Right ankle pain 06/19/2015  . Right hip pain 09/04/2017  . Right shoulder pain 06/01/2017  . Seasonal allergies   . Sleep apnea, obstructive    uses CPAP nightly - had sleep study 08/22/2007   Past Surgical History:  Procedure Laterality Date  . CARPAL TUNNEL RELEASE Right 10/02/2012   Procedure: RIGHT CARPAL TUNNEL RELEASE ENDOSCOPIC;  Surgeon: Jolyn Nap, MD;  Location: [facility];  Service: Orthopedics;  Laterality: Right;  . CHOLECYSTECTOMY N/A 12/02/2014   Procedure: LAPAROSCOPIC CHOLECYSTECTOMY;  Surgeon: Armandina Gemma, MD;  Location: [facility];  Service: General;  Laterality: N/A;  . HYSTEROSCOPY WITH D & C  01-08-2002    FAMILY HISTORY Family History  Problem Relation Age of Onset  . Other Mother   . Leukemia Cousin   . Other Other   . Obesity Other   . Other Other   . Diabetes Maternal Aunt   . Heart attack Maternal Aunt   . Alzheimer's disease Maternal Uncle   . Cancer Maternal Uncle        prostate  . Diabetes Paternal Aunt   . Cancer Paternal Aunt        brain,mouth  . Alzheimer's disease Paternal Aunt   . Diabetes Paternal Uncle   . Diabetes Maternal Grandmother   . Diabetes Maternal Grandfather     SOCIAL HISTORY Social History   Tobacco Use  . Smoking status: Former Smoker    Years: 10.00    Quit date: 05/24/2010  Years since quitting: 9.6  . Smokeless tobacco: Never Used  Vaping Use  . Vaping Use: Never used  Substance Use Topics  . Alcohol use: Yes    Alcohol/week: 0.0 standard drinks    Comment: socially  . Drug use: No         OPHTHALMIC EXAM:  Base Eye Exam    Visual Acuity (Snellen - Linear)      Right  Left   Dist cc 20/40 -2 20/40 -2   Dist ph cc 20/25 -2 20/25 -2   Correction: Glasses       Tonometry (Tonopen, 1:25 PM)      Right Left   Pressure 13 13       Pupils      Dark Light Shape React APD   Right 3 2 Round Brisk None   Left 3 2 Round Brisk None       Visual Fields (Counting fingers)      Left Right    Full Full       Extraocular Movement      Right Left    Full, Ortho Full, Ortho       Neuro/Psych    Oriented x3: Yes   Mood/Affect: Normal       Dilation    Both eyes: 1.0% Mydriacyl, 2.5% Phenylephrine @ 1:26 PM        Slit Lamp and Fundus Exam    Slit Lamp Exam      Right Left   Lids/Lashes Dermatochalasis - upper lid, mild Meibomian gland dysfunction Dermatochalasis - upper lid, mild Meibomian gland dysfunction   Conjunctiva/Sclera White and quiet White and quiet   Cornea trace Punctate epithelial erosions 1-2+Punctate epithelial erosions   Anterior Chamber deep and quiet deep and quiet   Iris round and dilated, No NVI round and dilated   Lens 1+ NS, 1+CC 1+NS, 1+CC   Vitreous mild vitreous syneresis mild vitreous syneresis       Fundus Exam      Right Left   Disc Pink and sharp Pink and sharp   C/D Ratio 0.6 0.6   Macula good foveal reflex, focal exudates inferior to fovea and scattered superior macula, scattered MA blunted foveal reflex, scatttered MA's and exudate, +cystic changes nasal fovea, focal cluster of exudates temporal macula   Vessels Vascular attenuation, Tortuous, Copper wiring Vascular attenuation, Tortuous, Copper wiring   Periphery Attached, scattered MA greatest nasally -- improved Attached, scattered MA and exudate greatest superior to disc, white without pressure temporally          IMAGING AND PROCEDURES  Imaging and Procedures for _0 @  OCT, Retina - OU - Both Eyes       Right Eye Quality was good. Central Foveal Thickness: 225. Progression has been stable. Findings include normal foveal contour, no SRF,  intraretinal fluid, intraretinal hyper-reflective material (persistent cystic changes/IRHM -- greatest inf temp).   Left Eye Quality was good. Central Foveal Thickness: 263. Progression has improved. Findings include intraretinal fluid, no SRF, abnormal foveal contour, intraretinal hyper-reflective material (mild interval improvement in IRF).   Notes *Images captured and stored on drive  Diagnosis / Impression:  +DME OU-- persistent IRF/IRHM OD: persistent cystic changes/IRHM -- greatest inf temp OS: mild interval improvement in IRF  Clinical management:  See below  Abbreviations: NFP - Normal foveal profile. CME - cystoid macular edema. PED - pigment epithelial detachment. IRF - intraretinal fluid. SRF - subretinal fluid. EZ - ellipsoid zone. ERM - epiretinal membrane. ORA -  outer retinal atrophy. ORT - outer retinal tubulation. SRHM - subretinal hyper-reflective material        Intravitreal Injection, Pharmacologic Agent - OD - Right Eye       Time Out 01/15/2020. 1:44 PM. Confirmed correct patient, procedure, site, and patient consented.   Anesthesia Topical anesthesia was used. Anesthetic medications included Lidocaine 2%, Proparacaine 0.5%.   Procedure Preparation included 5% betadine to ocular surface, eyelid speculum. A supplied needle was used.   Injection:  1.25 mg Bevacizumab (AVASTIN) SOLN   NDC: 76283-151-76, Lot: [id number] , Expiration date: 02/19/2020   Route: Intravitreal, Site: Right Eye, Waste: 0 mL  Post-op Post injection exam found visual acuity of at least counting fingers. The patient tolerated the procedure well. There were no complications. The patient received written and verbal post procedure care education. Post injection medications were not given.        Intravitreal Injection, Pharmacologic Agent - OS - Left Eye       Time Out 01/15/2020. 1:45 PM. Confirmed correct patient, procedure, site, and patient consented.   Anesthesia Topical  anesthesia was used. Anesthetic medications included Lidocaine 2%, Proparacaine 0.5%.   Procedure Preparation included 5% betadine to ocular surface, eyelid speculum. A (32g) needle was used.   Injection:  1.25 mg Bevacizumab (AVASTIN) SOLN   NDC: 16073-710-62, Lot: [id number], Expiration date: 02/08/2020   Route: Intravitreal, Site: Left Eye, Waste: 0.05 mL  Post-op Post injection exam found visual acuity of at least counting fingers. The patient tolerated the procedure well. There were no complications. The patient received written and verbal post procedure care education. Post injection medications were not given.                 ASSESSMENT/PLAN:    ICD-10-CM   1. Moderate nonproliferative diabetic retinopathy of both eyes with macular edema associated with type 2 diabetes mellitus (HCC)  E70.3500 Intravitreal Injection, Pharmacologic Agent - OD - Right Eye    Intravitreal Injection, Pharmacologic Agent - OS - Left Eye    Bevacizumab (AVASTIN) SOLN 1.25 mg    Bevacizumab (AVASTIN) SOLN 1.25 mg  2. Retinal edema  H35.81 OCT, Retina - OU - Both Eyes  3. Essential hypertension  I10   4. Hypertensive retinopathy of both eyes  H35.033   5. Nuclear sclerosis of both eyes  H25.13     1,2. Moderate non-proliferative diabetic retinopathy w/ DME, OU  - delayed f/u from 4 wks to 3.5 mos (07.02.21 to 10.15.21) -- discussed importance of regular follow up and treatments  - pt lost to f/u from 10.23.2019 to 11.18.20  - FA in 2019 showed leaking MA OU; no NV OU  **of note, pt had pruritic rxn to fluorescein dye -- resolved with 12.5 mg IV benadryl**  - FA 12.16.2020 shows late leaking MA's, non-perfusion defects; no NV OU  - s/p IVA OD #1 (12.16.20), #2 (01.25.21), #3 (05.26.21), #4 (07.02.21), #5 (10.15.21)  - s/p focal laser OD (03.19.21)  - s/p IVA OS #1 OS (12.23.20), #2 (01.27.21), #3 (03.02.21), #4 (05.26.21), #5 (07.02.21), #6 (10.15.21)  - exam shows scattered MA, IRH, exudates  and mild macular edema OU  - OCT shows persistent cystic changes/IRHM OD -- greatest inf temp; OS: mild interval improvement in IRF  - BCVA OD stable at 20/25; OS improved to 20/25 from 20/30             - Recommend IVA OU -- #6 OD and #7 OS today, 11.23.21             -  Risks and benefits of tx discussed w/pt             - Pt wishes to proceed w/tx today             - informed consent obtained  - Avastin informed consent form signed and scanned on 01.27.2021             - see procedure note  - f/u 6 weeks, DFE, OCT, possible injection(s), possible focal laser OD  3,4. Hypertensive retinopathy OU  - discussed importance of tight BP control  - monitor  5. Nuclear Sclerosis   - The symptoms of cataract, surgical options, and treatments and risks were discussed with patient.  - discussed diagnosis and progression  - under the expert management of [place] Ordered this visit:  Meds ordered this encounter  Medications  . Bevacizumab (AVASTIN) SOLN 1.25 mg  . Bevacizumab (AVASTIN) SOLN 1.25 mg       Return in about 6 weeks (around 02/26/2020) for f/u 6 weeks, NPDR OU, DFE, OCT.  There are no Patient Instructions on file for this visit.   Explained the diagnoses, plan, and follow up with the patient and they expressed understanding.  Patient expressed understanding of the importance of proper follow up care.  This document serves as a record of services personally performed by Gardiner Sleeper, MD, PhD. It was created on their behalf by San Jetty. Owens Shark, OA an ophthalmic technician. The creation of this record is the provider's dictation and/or activities during the visit.    Electronically signed by: San Jetty. Owens Shark, [place] 11.22.2021 4:48 PM   Gardiner Sleeper, M.D., Ph.D. Diseases & Surgery of the Retina and Vitreous [facility]  I have reviewed the above documentation for accuracy and completeness, and I agree with the above. Gardiner Sleeper, M.D., Ph.D. 01/15/20 4:48 PM  Abbreviations: M myopia (nearsighted); A astigmatism; H hyperopia (farsighted); P presbyopia; Mrx spectacle prescription;  CTL contact lenses; OD right eye; OS left eye; OU both eyes  XT exotropia; ET esotropia; PEK punctate epithelial keratitis; PEE punctate epithelial erosions; DES dry eye syndrome; MGD meibomian gland dysfunction; ATs artificial tears; PFAT's preservative free artificial tears; Pace nuclear sclerotic cataract; PSC posterior subcapsular cataract; ERM epi-retinal membrane; PVD posterior vitreous detachment; RD retinal detachment; DM diabetes mellitus; DR diabetic retinopathy; NPDR non-proliferative diabetic retinopathy; PDR proliferative diabetic retinopathy; CSME clinically significant macular edema; DME diabetic macular edema; dbh dot blot hemorrhages; CWS cotton wool spot; POAG primary open angle glaucoma; C/D cup-to-disc ratio; HVF humphrey visual field; GVF goldmann visual field; OCT optical coherence tomography; IOP intraocular pressure; BRVO Branch retinal vein occlusion; CRVO central retinal vein occlusion; CRAO central retinal artery occlusion; BRAO branch retinal artery occlusion; RT retinal tear; SB scleral buckle; PPV pars plana vitrectomy; VH Vitreous hemorrhage; PRP panretinal laser photocoagulation; IVK intravitreal kenalog; VMT vitreomacular traction; MH Macular hole;  NVD neovascularization of the disc; NVE neovascularization elsewhere; AREDS age related eye disease study; ARMD age related macular degeneration; POAG primary open angle glaucoma; EBMD epithelial/anterior basement membrane dystrophy; ACIOL anterior chamber intraocular lens; IOL intraocular lens; PCIOL posterior chamber intraocular lens; Phaco/IOL phacoemulsification with intraocular lens placement; [place] photorefractive keratectomy; LASIK laser assisted in situ keratomileusis; HTN hypertension; DM diabetes mellitus; COPD chronic obstructive pulmonary disease

## 2020-01-15 ENCOUNTER — Ambulatory Visit (INDEPENDENT_AMBULATORY_CARE_PROVIDER_SITE_OTHER): Payer: Medicaid Other | Admitting: Ophthalmology

## 2020-01-15 ENCOUNTER — Encounter (INDEPENDENT_AMBULATORY_CARE_PROVIDER_SITE_OTHER): Payer: Self-pay | Admitting: Ophthalmology

## 2020-01-15 ENCOUNTER — Other Ambulatory Visit: Payer: Self-pay

## 2020-01-15 DIAGNOSIS — I10 Essential (primary) hypertension: Secondary | ICD-10-CM | POA: Diagnosis not present

## 2020-01-15 DIAGNOSIS — H2513 Age-related nuclear cataract, bilateral: Secondary | ICD-10-CM

## 2020-01-15 DIAGNOSIS — H3581 Retinal edema: Secondary | ICD-10-CM | POA: Diagnosis not present

## 2020-01-15 DIAGNOSIS — E113313 Type 2 diabetes mellitus with moderate nonproliferative diabetic retinopathy with macular edema, bilateral: Secondary | ICD-10-CM | POA: Diagnosis not present

## 2020-01-15 DIAGNOSIS — H35033 Hypertensive retinopathy, bilateral: Secondary | ICD-10-CM | POA: Diagnosis not present

## 2020-01-15 MED ORDER — BEVACIZUMAB CHEMO INJECTION 1.25MG/0.05ML SYRINGE FOR KALEIDOSCOPE
1.2500 mg | INTRAVITREAL | Status: AC | PRN
  Administered 2020-01-15: 1.25 mg via INTRAVITREAL

## 2020-02-05 ENCOUNTER — Other Ambulatory Visit: Payer: Self-pay

## 2020-02-05 ENCOUNTER — Ambulatory Visit (INDEPENDENT_AMBULATORY_CARE_PROVIDER_SITE_OTHER): Payer: Medicaid Other | Admitting: Neurology

## 2020-02-05 DIAGNOSIS — G4733 Obstructive sleep apnea (adult) (pediatric): Secondary | ICD-10-CM

## 2020-02-05 DIAGNOSIS — Z789 Other specified health status: Secondary | ICD-10-CM

## 2020-02-05 DIAGNOSIS — Z9114 Patient's other noncompliance with medication regimen: Secondary | ICD-10-CM

## 2020-02-06 ENCOUNTER — Telehealth: Payer: Self-pay | Admitting: Neurology

## 2020-02-06 DIAGNOSIS — G4710 Hypersomnia, unspecified: Secondary | ICD-10-CM

## 2020-02-06 DIAGNOSIS — Z789 Other specified health status: Secondary | ICD-10-CM

## 2020-02-06 DIAGNOSIS — Z9114 Patient's other noncompliance with medication regimen: Secondary | ICD-10-CM | POA: Insufficient documentation

## 2020-02-06 DIAGNOSIS — E662 Morbid (severe) obesity with alveolar hypoventilation: Secondary | ICD-10-CM

## 2020-02-06 DIAGNOSIS — E1165 Type 2 diabetes mellitus with hyperglycemia: Secondary | ICD-10-CM

## 2020-02-06 DIAGNOSIS — G4733 Obstructive sleep apnea (adult) (pediatric): Secondary | ICD-10-CM | POA: Insufficient documentation

## 2020-02-06 NOTE — Telephone Encounter (Signed)
PATIENT'S NAME:  Ana Long, Ana Long DOB:      [birth year]      MR#:    [id number]     DATE OF RECORDING: 02/05/2020 BH REFERRING M.D.:  Debbora Presto, NP  Study Performed:   Titration to CPAP and BiPAP HISTORY:  Ana Long is a 44 y.o. female seen for follow up / compliance for OSA on CPAP She admits that she has had more difficulty with CPAP compliance over the past few months.  She is not sleeping as well as she usually does. She contributes this to going to sleep early in the morning, usually around 3-4am.  She usually wakes around 6:30 AM.  She feels that her mask is not fitting as well as it used to.  She has noticed an air leak that is bothersome.   Compliance report dated 10/22/2019 through 11/20/2019 reveals that she used CPAP 24 of the past 30 days for compliance of 80%.  She used CPAP greater than 4 hours 12 of the past 30 days for compliance of 40%.  Average use was 3 hours and 42 minutes.  Residual AHI was 27.9 events per hour on 4 to 15 cm of water pressure and an EPR of 3.  Average in the 95th percentile of 14.1 cm of water pressure.  There was a significant leak noted in the 95th percentile of 20.8 L/min while using an AirFit P10 nasal pillow.   The patient returned for an attended titration to PAP.  The patient endorsed the Epworth Sleepiness Scale at 19 points.   The patient's weight 312 pounds with a height of 62 (inches), resulting in a BMI of 57.6 kg/m2. The patient's neck circumference measured 22 inches.  CURRENT MEDICATIONS: Januvia, Hydrodiuril, Novolog, Zovirax, Proair, Lipitor, Prozac, Cephulac, Robaxin, Claritin, Dulera, Singulair, Prilosec, Nitrodur, Turmeric, Lasix, Victoza, Topamax  PROCEDURE:  This is a multichannel digital polysomnogram utilizing the SomnoStar 11.2 system.  Electrodes and sensors were applied and monitored per AASM Specifications.   EEG, EOG, Chin and Limb EMG, were sampled at 200 Hz.  ECG, Snore and Nasal Pressure, Thermal Airflow, Respiratory  Effort, CPAP Flow and Pressure, Oximetry was sampled at 50 Hz. Digital video and audio were recorded.       CPAP was initiated under a new P30I small nasal pillow with sturdier headgear- beginning at 6 cmH20 with heated humidity per AASM split night standards and pressure was advanced to 13 cmH20 CPAP, but did not control apnea, nor did the patient tolerate increasing pressures well. The technologist changed to BiPAP at 14/10 cm because of hypopneas, apneas and desaturations.  At a BiPAP pressure of 16/11 cmH20, there was a reduction of the AHI to 0 with improvement of sleep apnea. The patient slept 69 minutes at this setting and reached REM sleep and oral venting was much reduced under BiPAP.   Lights Out was at 23:06 and Lights On at 04:49. Total recording time (TRT) was 343.5 minutes, with a total sleep time (TST) of 327.5 minutes. The patient's sleep latency was 6 minutes. REM latency was 10 minutes.  The sleep efficiency was 95.3 %.    SLEEP ARCHITECTURE: WASO (Wake after sleep onset) was 8.5 minutes.  There were 16 minutes in Stage N1, 177.5 minutes Stage N2, 32.5 minutes Stage N3 and 101.5 minutes in Stage REM.  The percentage of Stage N1 was 4.9%, Stage N2 was 54.2%, Stage N3 was 9.9% and Stage R (REM sleep) was 31.%.    RESPIRATORY ANALYSIS:  There was  a total of 53 respiratory events: 0 obstructive apneas, 0 central apneas and 0 mixed apneas with a total of 0 apneas and an apnea index (AHI) of 0 /hour. There were 53 hypopneas with a hypopnea index of 9.7/hour.   The total APNEA/HYPOPNEA INDEX  (AHI) was 9.7 /hour and the total RESPIRATORY DISTURBANCE INDEX was 9.7 /hour  36 events occurred in REM sleep and 17 events in NREM. The REM AHI was 21.3 /hour versus a non-REM AHI of 4.5 /hour.  The patient spent 327.5 minutes of total sleep time in the supine position and 0 minutes in non-supine. The supine AHI was 9.7, versus a non-supine AHI of 0.0.  OXYGEN SATURATION & C02:  The baseline 02  saturation was 95%, with the lowest being 85%. Time spent below 89% saturation equaled 10 minutes.  The arousals were noted as: 15 were spontaneous, 0 were associated with PLMs, 16 were associated with respiratory events. The patient had a total of 0 Periodic Limb Movements. The Periodic Limb Movement (PLM) index was 0 and the PLM Arousal index was 0 /hour.   Audio and video analysis did not show any abnormal or unusual movements, behaviors, phonations or vocalizations.   Snoring was alleviated by switch to BiPAP. EKG was in keeping with normal sinus rhythm with isolated PVCs.    DIAGNOSISObstructive Sleep Apnea with snoring, oral venting and a high residual AHI on CPAP responded well to BiPAP settings at 16/11 cm water, heated humidification and a new ResMed P30I small nasal pillow. The patient was able to tolerate much higher pressures on BiPAP still using a nasal pillow interface.  1. NO treatment emergent Central Sleep Apnea was seen.  2. NO clinically relevant Sleep Related Hypoxemia was recorded.  PLANS/RECOMMENDATIONS: the patient will be provided with a BiPAP machine with settings at 16/11 cm water, heated humidification and with a new ResMed P30I small nasal pillow 1. Every sleep apnea patient should avoid sedatives, hypnotics, and alcohol consumption at bedtime.   DISCUSSION: A follow up appointment will be scheduled in the Sleep Clinic at [facility] with Debbora Presto, NP.   Please call [phone] with any questions.     I certify that I have reviewed the entire raw data recording prior to the issuance of this report in accordance with the Standards of Accreditation of the American Academy of Sleep Medicine (AASM)  Larey Seat, M.D. Diplomat, Tax adviser of Neurology  Diplomat, Tax adviser of Sleep Medicine Market researcher, [employer] [facility] at [employer]

## 2020-02-06 NOTE — Procedures (Signed)
PATIENT'S NAME:  Ana Long, Ana Long DOB:      02/14/1976      MR#:    [id number]     DATE OF RECORDING: 02/05/2020 BH REFERRING M.D.:  Debbora Presto, NP  Study Performed:   Titration to CPAP and BiPAP HISTORY:  Ana Long is a 44 y.o. female seen for follow up / compliance for OSA on CPAP She admits that she has had more difficulty with CPAP compliance over the past few months.  She is not sleeping as well as she usually does. She contributes this to going to sleep early in the morning, usually around 3-4am.  She usually wakes around 6:30 AM.  She feels that her mask is not fitting as well as it used to.  She has noticed an air leak that is bothersome.   Compliance report dated 10/22/2019 through 11/20/2019 reveals that she used CPAP 24 of the past 30 days for compliance of 80%.  She used CPAP greater than 4 hours 12 of the past 30 days for compliance of 40%.  Average use was 3 hours and 42 minutes.  Residual AHI was 27.9 events per hour on 4 to 15 cm of water pressure and an EPR of 3.  Average in the 95th percentile of 14.1 cm of water pressure.  There was a significant leak noted in the 95th percentile of 20.8 L/min while using an AirFit P10 nasal pillow.   The patient returned for an attended titration to PAP.  The patient endorsed the Epworth Sleepiness Scale at 19 points.   The patient's weight 312 pounds with a height of 62 (inches), resulting in a BMI of 57.6 kg/m2. The patient's neck circumference measured 22 inches.  CURRENT MEDICATIONS: Januvia, Hydrodiuril, Novolog, Zovirax, Proair, Lipitor, Prozac, Cephulac, Robaxin, Claritin, Dulera, Singulair, Prilosec, Nitrodur, Turmeric, Lasix, Victoza, Topamax  PROCEDURE:  This is a multichannel digital polysomnogram utilizing the SomnoStar 11.2 system.  Electrodes and sensors were applied and monitored per AASM Specifications.   EEG, EOG, Chin and Limb EMG, were sampled at 200 Hz.  ECG, Snore and Nasal Pressure, Thermal Airflow, Respiratory  Effort, CPAP Flow and Pressure, Oximetry was sampled at 50 Hz. Digital video and audio were recorded.       CPAP was initiated under a new P30I small nasal pillow with sturdier headgear- beginning at 6 cmH20 with heated humidity per AASM split night standards and pressure was advanced to 13 cmH20 CPAP, but did not control apnea, nor did the patient tolerate increasing pressures well. The technologist changed to BiPAP at 14/10 cm because of hypopneas, apneas and desaturations.  At a BiPAP pressure of 16/11 cmH20, there was a reduction of the AHI to 0 with improvement of sleep apnea. The patient slept 69 minutes at this setting and reached REM sleep and oral venting was much reduced under BiPAP.   Lights Out was at 23:06 and Lights On at 04:49. Total recording time (TRT) was 343.5 minutes, with a total sleep time (TST) of 327.5 minutes. The patient's sleep latency was 6 minutes. REM latency was 10 minutes.  The sleep efficiency was 95.3 %.    SLEEP ARCHITECTURE: WASO (Wake after sleep onset) was 8.5 minutes.  There were 16 minutes in Stage N1, 177.5 minutes Stage N2, 32.5 minutes Stage N3 and 101.5 minutes in Stage REM.  The percentage of Stage N1 was 4.9%, Stage N2 was 54.2%, Stage N3 was 9.9% and Stage R (REM sleep) was 31.%.    RESPIRATORY ANALYSIS:  There was  a total of 53 respiratory events: 0 obstructive apneas, 0 central apneas and 0 mixed apneas with a total of 0 apneas and an apnea index (AHI) of 0 /hour. There were 53 hypopneas with a hypopnea index of 9.7/hour.   The total APNEA/HYPOPNEA INDEX  (AHI) was 9.7 /hour and the total RESPIRATORY DISTURBANCE INDEX was 9.7 /hour  36 events occurred in REM sleep and 17 events in NREM. The REM AHI was 21.3 /hour versus a non-REM AHI of 4.5 /hour.  The patient spent 327.5 minutes of total sleep time in the supine position and 0 minutes in non-supine. The supine AHI was 9.7, versus a non-supine AHI of 0.0.  OXYGEN SATURATION & C02:  The baseline 02  saturation was 95%, with the lowest being 85%. Time spent below 89% saturation equaled 10 minutes.  The arousals were noted as: 15 were spontaneous, 0 were associated with PLMs, 16 were associated with respiratory events. The patient had a total of 0 Periodic Limb Movements. The Periodic Limb Movement (PLM) index was 0 and the PLM Arousal index was 0 /hour.   Audio and video analysis did not show any abnormal or unusual movements, behaviors, phonations or vocalizations.   Snoring was alleviated by switch to BiPAP. EKG was in keeping with normal sinus rhythm with isolated PVCs.    DIAGNOSISObstructive Sleep Apnea with snoring, oral venting and a high residual AHI on CPAP responded well to BiPAP settings at 16/11 cm water, heated humidification and a new ResMed P30I small nasal pillow. The patient was able to tolerate much higher pressures on BiPAP still using a nasal pillow interface.  1. NO treatment emergent Central Sleep Apnea was seen.  2. NO clinically relevant Sleep Related Hypoxemia was recorded.  PLANS/RECOMMENDATIONS: the patient will be provided with a BiPAP machine with settings at 16/11 cm water, heated humidification and with a new ResMed P30I small nasal pillow 1. Every sleep apnea patient should avoid sedatives, hypnotics, and alcohol consumption at bedtime.   DISCUSSION: A follow up appointment will be scheduled in the Sleep Clinic at [facility] with Debbora Presto, NP.   Please call [phone] with any questions.     I certify that I have reviewed the entire raw data recording prior to the issuance of this report in accordance with the Standards of Accreditation of the American Academy of Sleep Medicine (AASM)  Larey Seat, M.D. Diplomat, Tax adviser of Neurology  Diplomat, Tax adviser of Sleep Medicine Market researcher, [employer] [facility] at [employer]

## 2020-02-07 NOTE — Progress Notes (Signed)
DIAGNOSIS:Obstructive Sleep Apnea with snoring, oral venting and a  high residual AHI on CPAP responded well to BiPAP settings at  16/11 cm water, heated humidification and a new ResMed P30I small  nasal pillow. The patient was able to tolerate much higher  pressures on BiPAP still using a nasal pillow interface.  1. NO treatment emergent Central Sleep Apnea was seen.  2. NO clinically relevant Sleep Related Hypoxemia was recorded.   PLANS/RECOMMENDATIONS: the patient will be provided with a BiPAP  machine with settings at 16/11 cm water, heated humidification  and with a new ResMed P30I small nasal pillow  1. Every sleep apnea patient should avoid sedatives, hypnotics,  and alcohol consumption at bedtime.   DISCUSSION: A follow up appointment will be scheduled in the  Sleep Clinic at [facility] with Debbora Presto,  NP.  Please call [phone] with any questions.

## 2020-02-07 NOTE — Telephone Encounter (Signed)
I called pt. I advised pt that Dr. Brett Fairy reviewed their sleep study results and found that pt was best treated with BiPAP. Dr. Brett Fairy recommends that pt starts BiPAP 16/11 cm water pressure. I reviewed PAP compliance expectations with the pt. Pt is agreeable to starting a BiPAP. I advised pt that an order will be sent to a DME, [facility] and [facility] will call the pt within about one week after they file with the pt's insurance. [facility]) will show the pt how to use the machine, fit for masks, and troubleshoot the BiPAP if needed. A follow up appt will need to be made for insurance purposes with Dr. Brett Fairy or the NP. Pt verbalized understanding to call our office and schedule a visit 61-90 days from set up with the BiPAP. Pt verbalized understanding of results. Pt had no questions at this time but was encouraged to call back if questions arise. I have sent the order to [facility]) to change patient from CPAP to BiPAP and have received confirmation that they have received the order.

## 2020-02-25 NOTE — Progress Notes (Deleted)
PATIENT: Ana Long DOB: [birth year]  REASON FOR VISIT: follow up HISTORY FROM: patient  Virtual Visit via Telephone Note  I connected with Ana Long on 02/25/20 at  8:00 AM EST by telephone and verified that I am speaking with the correct person using two identifiers.   I discussed the limitations, risks, security and privacy concerns of performing an evaluation and management service by telephone and the availability of in person appointments. I also discussed with the patient that there may be a patient responsible charge related to this service. The patient expressed understanding and agreed to proceed.   History of Present Illness:  02/25/20 Ana Long is a 45 y.o. female here today for follow up for OSA recently switched to BiPAP therapy.    History (copied from previous note)  Ana Long is a 45 y.o. female here today for follow up for OSA on CPAP.  She admits that she has had more difficulty with CPAP compliance over the past few months.  She is not sleeping as well as she usually does.  She contributes this to going to sleep early in the morning, usually around 3-4am.  She usually wakes around 6:30 AM.  She feels that her mask is not fitting as well as it used to.  She has noticed an air leak that is bothersome.  Otherwise she is doing well.  She continues to follow closely with primary care for comorbidity management.  Compliance report dated 10/22/2019 through 11/20/2019 reveals that she used CPAP 24 of the past 30 days for compliance of 80%.  She used CPAP greater than 4 hours 12 of the past 30 days for compliance of 40%.  Average use on days used was 3 hours and 42 minutes.  Residual AHI was 27.9 events per hour on 4 to 15 cm of water pressure and an EPR of 3.  Average in the 95th percentile of 14.1 cm of water pressure.  There was a significant leak noted in the 95th percentile of 20.8 L/min.   Observations/Objective:  Generalized: Well  developed, in no acute distress  Mentation: Alert oriented to time, place, history taking. Follows all commands speech and language fluent   Assessment and Plan:  45 y.o. year old female  has a past medical history of Acute nonintractable headache (12/26/2017), Arthritis, Asthma, Asthma exacerbation (07/25/2013), Asthma in adult, severe persistent, with acute exacerbation (01/11/2018), Carpal tunnel syndrome of right wrist (09/2012), Cataract, Complication of anesthesia, Constipation (12/30/2014), Diabetes mellitus, Diabetic retinopathy ([place]), Dry skin, Family history of anesthesia complication, Genital herpes (01/01/2006), GERD (gastroesophageal reflux disease), Herpes genital, Hyperglycemia (12/26/2017), Hypertensive retinopathy, Low back pain (11/22/2012), Lower extremity edema (10/18/2012), Menorrhagia (11/06/2015), Morbid obesity with BMI of 50 to 60 (07/23/2009), Obesity hypoventilation syndrome ([place]) (10/30/2012), Obesity, morbid ([place]) (01/10/2014), OSA on CPAP, PCOS (polycystic ovarian syndrome), Rheumatoid arthritis ([place]), Right ankle pain (06/19/2015), Right hip pain (09/04/2017), Right shoulder pain (06/01/2017), Seasonal allergies, and Sleep apnea, obstructive. here with    ICD-10-CM   1. OSA treated with BiPAP  G47.33     No orders of the defined types were placed in this encounter.   No orders of the defined types were placed in this encounter.    Follow Up Instructions:  I discussed the assessment and treatment plan with the patient. The patient was provided an opportunity to ask questions and all were answered. The patient agreed with the plan and demonstrated an understanding of the instructions.   The patient was advised  to call back or seek an in-person evaluation if the symptoms worsen or if the condition fails to improve as anticipated.  I provided *** minutes of non-face-to-face time during this encounter.   Debbora Presto, NP

## 2020-02-26 ENCOUNTER — Telehealth: Payer: Medicaid Other | Admitting: Family Medicine

## 2020-02-26 ENCOUNTER — Encounter (INDEPENDENT_AMBULATORY_CARE_PROVIDER_SITE_OTHER): Payer: Medicaid Other | Admitting: Ophthalmology

## 2020-02-26 DIAGNOSIS — H3581 Retinal edema: Secondary | ICD-10-CM

## 2020-02-26 DIAGNOSIS — H2513 Age-related nuclear cataract, bilateral: Secondary | ICD-10-CM

## 2020-02-26 DIAGNOSIS — E113313 Type 2 diabetes mellitus with moderate nonproliferative diabetic retinopathy with macular edema, bilateral: Secondary | ICD-10-CM

## 2020-02-26 DIAGNOSIS — H35033 Hypertensive retinopathy, bilateral: Secondary | ICD-10-CM

## 2020-02-26 DIAGNOSIS — I10 Essential (primary) hypertension: Secondary | ICD-10-CM

## 2020-02-26 DIAGNOSIS — G4733 Obstructive sleep apnea (adult) (pediatric): Secondary | ICD-10-CM

## 2020-02-29 NOTE — Progress Notes (Signed)
[facility] Note  03/04/2020     CHIEF COMPLAINT Patient presents for Retina Follow Up   HISTORY OF PRESENT ILLNESS: Ana Long is a 45 y.o. female who presents to the clinic today for:   HPI    Retina Follow Up    Patient presents with  Diabetic Retinopathy.  In both eyes.  Duration of 7 weeks.  I, the attending physician,  performed the HPI with the patient and updated documentation appropriately.          Comments    7 week follow up NPDR OU- Vision stable OU.   BS 202 this am  A1C 9.7 Pt had an asthma attack about 2 weeks ago and started Prednisone.  Her BS went up to about 450.        Last edited by Bernarda Caffey, MD on 03/05/2020 11:43 PM. (History)    pt states she had an asthma attack and has been on Prednisone for 3 weeks, she states her BP has been anywhere from 350-450, her dr has increased her Lantus from 40 units to 50 units a day  Referring physician: [name] MD [place], [place] [id number]  HISTORICAL INFORMATION:   Selected notes from the [facility] Re-referred by Dr. [name] for DM exam   CURRENT MEDICATIONS: Current Outpatient Medications (Ophthalmic Drugs)  Medication Sig  . Olopatadine HCl 0.2 % SOLN Place one drop into both eyes daily.   No current facility-administered medications for this visit. (Ophthalmic Drugs)   Current Outpatient Medications (Other)  Medication Sig  . acyclovir (ZOVIRAX) 400 MG tablet TAKE 1 TABLET BY MOUTH TWICE A DAY  . albuterol (PROAIR HFA) 108 (90 Base) MCG/ACT inhaler Inhale 2 puffs into the lungs every 4 (four) hours as needed for wheezing or shortness of breath (cough). (Patient taking differently: Inhale 2 puffs into the lungs every 4 (four) hours as needed for wheezing or shortness of breath (or coughing).)  . atorvastatin (LIPITOR) 40 MG tablet Take 1 tablet (40 mg total) by mouth at bedtime.  . benzonatate (TESSALON) 200 MG capsule Take 1  capsule (200 mg total) by mouth 3 (three) times daily as needed for cough.  . dapagliflozin propanediol (FARXIGA) 5 MG TABS tablet Take 5 mg by mouth daily. (Patient taking differently: Take 5 mg by mouth 2 (two) times a day.)  . DERMA-SMOOTHE/FS SCALP 0.01 % OIL Apply topically.  . fluconazole (DIFLUCAN) 150 MG tablet TAKE 1 TABLET BY MOUTH NOW. THEN REPEAT IN 1 WEEK  . FLUoxetine (PROZAC) 20 MG capsule Take by mouth.  . furosemide (LASIX) 20 MG tablet Take 20 mg by mouth daily as needed for fluid or edema.   . hydrochlorothiazide (HYDRODIURIL) 12.5 MG tablet Take by mouth.  . insulin aspart (NOVOLOG) 100 UNIT/ML injection Inject 0-20 Units into the skin every 4 (four) hours. CBG 70 - 120: 0 units  CBG 121 - 150: 3 units  CBG 151 - 200: 4 units  CBG 201 - 250: 7 units  CBG 251 - 300: 11 units  CBG 301 - 350: 15 units  CBG 351 - 400: 20 units  CBG > 400 call MD (Patient taking differently: Inject 20 Units into the skin every 4 (four) hours. CBG 70 - 120: 0 units  CBG 121 - 150: 3 units  CBG 151 - 200: 4 units  CBG 201 - 250: 7 units  CBG 251 - 300: 11 units  CBG 301 - 350: 15 units  CBG 351 - 400: 20 units  CBG > 400 call MD)  . insulin aspart protamine - aspart (NOVOLOG MIX 70/30 FLEXPEN) (70-30) 100 UNIT/ML FlexPen Inject into the skin.  . Insulin Pen Needle (B-D UF III MINI PEN NEEDLES) 31G X 5 MM MISC USE AS DIRECTED TO INJECT INSULIN FIVE A DAY E11.65  . JANUVIA 100 MG tablet TAKE 1 TABLET BY MOUTH EVERY DAY  . lactulose (CEPHULAC) 10 g packet Take 1 packet (10 g total) by mouth daily as needed. Reported on 07/29/2015 (Patient taking differently: Take 10 g by mouth daily as needed (for constipation).)  . liraglutide (VICTOZA) 18 MG/3ML SOPN Take 0.69m daily for 1 week, then 1.2868mdaily for 1 week, then 1.68m74maily.  . lMarland Kitchenratadine (CLARITIN) 10 MG tablet Take 1 tablet (10 mg total) by mouth 2 (two) times daily.  . methocarbamol (ROBAXIN) 500 MG tablet Take 1 tablet (500 mg total) by  mouth every 8 (eight) hours as needed. (Patient taking differently: Take 500 mg by mouth every 8 (eight) hours as needed for muscle spasms.)  . mometasone (NASONEX) 50 MCG/ACT nasal spray Place into the nose.  . mometasone-formoterol (DULERA) 200-5 MCG/ACT AERO Inhale 2 puffs into the lungs 2 (two) times daily.  . montelukast (SINGULAIR) 10 MG tablet Take 1 tablet (10 mg total) by mouth at bedtime.  . nitroGLYCERIN (NITRODUR - DOSED IN MG/24 HR) 0.2 mg/hr patch Apply 1/4th patch to affected achilles, change daily  . omeprazole (PRILOSEC) 20 MG capsule Take 1 capsule (20 mg total) by mouth daily before breakfast.  . SPIRIVA RESPIMAT 1.25 MCG/ACT AERS SMARTSIG:2 Puff(s) Via Inhaler Daily  . topiramate (TOPAMAX) 25 MG tablet Take by mouth.  . Blood Glucose Monitoring Suppl (ACCU-CHEK AVIVA PLUS) w/Device KIT Please check you blood sugars 4 times a day  . glucose blood (ACCU-CHEK SMARTVIEW) test strip TEST FOUR TIMES DAILY  . Insulin Glargine (LANTUS) 100 UNIT/ML Solostar Pen Inject 80 Units into the skin 2 (two) times daily.  . Lancets (ACCU-CHEK SOFT TOUCH) lancets Use as instructed   Current Facility-Administered Medications (Other)  Medication Route  . diphenhydrAMINE (BENADRYL) injection 12.5 mg Intramuscular      REVIEW OF SYSTEMS: ROS    Positive for: Gastrointestinal, Musculoskeletal, Endocrine, Eyes, Respiratory   Negative for: Constitutional, Neurological, Skin, Genitourinary, HENT, Cardiovascular, Psychiatric, Allergic/Imm, Heme/Lymph   Last edited by HodLeonie DouglasOA on 03/04/2020  1:21 PM. (History)       ALLERGIES Allergies  Allergen Reactions  . Bee Venom Anaphylaxis and Swelling  . Wasp Venom Anaphylaxis and Swelling  . Citrus Hives and Itching  . Latex Hives and Swelling  . Penicillins Hives and Itching    Has patient had a PCN reaction causing immediate rash, facial/tongue/throat swelling, SOB or lightheadedness with hypotension: Yes Has patient had a PCN reaction  causing severe rash involving mucus membranes or skin necrosis: Unk Has patient had a PCN reaction that required hospitalization: Was already in hosp Has patient had a PCN reaction occurring within the last 10 years: No If all of the above answers are "NO", then may proceed with Cephalosporin use.   . Shellfish Allergy Other (See Comments) and Swelling    Patient is uncertain; stated that she tolerates this (??)  . Soap Hives, Itching and Swelling    PUREX LAUNDRY DETERGENT  . Tomato Hives and Itching  . Banana Itching and Nausea Only  . Chocolate Itching  . Fluticasone Other (See Comments)  Per patient, makes sneeze and gag  . Hydrocodone Itching and Nausea Only  . Other Itching    Acidic foods- Itching [place]- Itching in the roof of the mouth  . Adhesive [Tape] Itching and Rash    Allergic to  "leads"  . Doxycycline Other (See Comments)    AFFECTS JOINTS  . Metformin And Related Nausea Only, Rash, Other (See Comments) and Diarrhea    GI UPSET, also  . Red Dye Itching, Rash and Other (See Comments)    At eye doctor's office, the dilating drops- Skin breaks out, chest turned red, itching, face became swollen    PAST MEDICAL HISTORY Past Medical History:  Diagnosis Date  . Acute nonintractable headache 12/26/2017  . Arthritis    major joints  . Asthma    prn inhaler  . Asthma exacerbation 07/25/2013  . Asthma in adult, severe persistent, with acute exacerbation 01/11/2018  . Carpal tunnel syndrome of right wrist 09/2012  . Cataract    NS OU  . Complication of anesthesia    states is hard to wake up post-op  . Constipation 12/30/2014  . Diabetes mellitus    intolerant to Metformin  . Diabetic retinopathy ([place])    NPDR OU  . Dry skin    hands   . Family history of anesthesia complication    mother went into coma during c-section  . Genital herpes 01/01/2006   on acyclovir BID   . GERD (gastroesophageal reflux disease)   . Herpes genital   . Hyperglycemia 12/26/2017   . Hypertensive retinopathy    OU  . Low back pain 11/22/2012  . Lower extremity edema 10/18/2012  . Menorrhagia 11/06/2015  . Morbid obesity with BMI of 50 to 60 07/23/2009  . Obesity hypoventilation syndrome ([place]) 10/30/2012  . Obesity, morbid ([place]) 01/10/2014  . OSA on CPAP     AHI was on  11-17-12 to 120/hr, titrated to 15 cm with 3 cm EPR/   . PCOS (polycystic ovarian syndrome)   . Rheumatoid arthritis ([place])   . Right ankle pain 06/19/2015  . Right hip pain 09/04/2017  . Right shoulder pain 06/01/2017  . Seasonal allergies   . Sleep apnea, obstructive    uses CPAP nightly - had sleep study 08/22/2007   Past Surgical History:  Procedure Laterality Date  . CARPAL TUNNEL RELEASE Right 10/02/2012   Procedure: RIGHT CARPAL TUNNEL RELEASE ENDOSCOPIC;  Surgeon: Jolyn Nap, MD;  Location: [facility];  Service: Orthopedics;  Laterality: Right;  . CHOLECYSTECTOMY N/A 12/02/2014   Procedure: LAPAROSCOPIC CHOLECYSTECTOMY;  Surgeon: Armandina Gemma, MD;  Location: [facility];  Service: General;  Laterality: N/A;  . HYSTEROSCOPY WITH D & C  01-08-2002    FAMILY HISTORY Family History  Problem Relation Age of Onset  . Other Mother   . Leukemia Cousin   . Other Other   . Obesity Other   . Other Other   . Diabetes Maternal Aunt   . Heart attack Maternal Aunt   . Alzheimer's disease Maternal Uncle   . Cancer Maternal Uncle        prostate  . Diabetes Paternal Aunt   . Cancer Paternal Aunt        brain,mouth  . Alzheimer's disease Paternal Aunt   . Diabetes Paternal Uncle   . Diabetes Maternal Grandmother   . Diabetes Maternal Grandfather     SOCIAL HISTORY Social History   Tobacco Use  . Smoking status: Former Smoker  Years: 10.00    Quit date: 05/24/2010    Years since quitting: 9.7  . Smokeless tobacco: Never Used  Vaping Use  . Vaping Use: Never used  Substance Use Topics  . Alcohol use: Yes    Alcohol/week: 0.0 standard drinks    Comment: socially  . Drug use:  No         OPHTHALMIC EXAM:  Base Eye Exam    Visual Acuity (Snellen - Linear)      Right Left   Dist cc 20/40 +2 20/30   Dist ph cc 20/25 +1 20/25 -2   Correction: Glasses       Tonometry (Tonopen, 1:29 PM)      Right Left   Pressure 17 17       Pupils      Dark Light Shape React APD   Right 3 2 Round Brisk None   Left 3 2 Round Brisk None       Visual Fields (Counting fingers)      Left Right    Full Full       Extraocular Movement      Right Left    Full Full       Neuro/Psych    Oriented x3: Yes   Mood/Affect: Normal       Dilation    Both eyes: 1.0% Mydriacyl, 2.5% Phenylephrine @ 1:30 PM        Slit Lamp and Fundus Exam    Slit Lamp Exam      Right Left   Lids/Lashes Dermatochalasis - upper lid, mild Meibomian gland dysfunction Dermatochalasis - upper lid, mild Meibomian gland dysfunction   Conjunctiva/Sclera White and quiet White and quiet   Cornea trace Punctate epithelial erosions 1-2+Punctate epithelial erosions   Anterior Chamber deep and quiet deep and quiet   Iris round and dilated, No NVI round and dilated   Lens 1+ NS, 1+CC 1+NS, 1+CC   Vitreous mild vitreous syneresis mild vitreous syneresis       Fundus Exam      Right Left   Disc Pink and sharp Pink and sharp   C/D Ratio 0.6 0.6   Macula good foveal reflex, focal exudates inferior to fovea and scattered superior macula, scattered MA good foveal reflex, scatttered MA's and exudate, central cystic changes -- slightly improved, focal cluster of exudates temporal macula   Vessels Vascular attenuation, Tortuous, Copper wiring Vascular attenuation, Tortuous, Copper wiring   Periphery Attached, scattered MA greatest nasally -- improved Attached, scattered MA and exudate greatest superior to disc, white without pressure temporally          IMAGING AND PROCEDURES  Imaging and Procedures for _0 @  OCT, Retina - OU - Both Eyes       Right Eye Quality was good. Central Foveal  Thickness: 222. Progression has improved. Findings include normal foveal contour, no SRF, intraretinal fluid, intraretinal hyper-reflective material (Int improvement in inf nasal cystic changes/IRHM).   Left Eye Quality was good. Central Foveal Thickness: 252. Progression has improved. Findings include intraretinal fluid, no SRF, abnormal foveal contour, intraretinal hyper-reflective material (mild interval improvement in IRF).   Notes *Images captured and stored on drive  Diagnosis / Impression:  +DME OU-- persistent IRF/IRHM OD: Int improvement in inf nasal cystic changes/IRHM OS: mild interval improvement in IRF  Clinical management:  See below  Abbreviations: NFP - Normal foveal profile. CME - cystoid macular edema. PED - pigment epithelial detachment. IRF - intraretinal fluid. SRF - subretinal fluid.  EZ - ellipsoid zone. ERM - epiretinal membrane. ORA - outer retinal atrophy. ORT - outer retinal tubulation. SRHM - subretinal hyper-reflective material        Intravitreal Injection, Pharmacologic Agent - OS - Left Eye       Time Out 03/04/2020. 2:35 PM. Confirmed correct patient, procedure, site, and patient consented.   Anesthesia Topical anesthesia was used. Anesthetic medications included Lidocaine 2%, Proparacaine 0.5%.   Procedure Preparation included 5% betadine to ocular surface, eyelid speculum. A supplied (32g) needle was used.   Injection:  1.25 mg Bevacizumab (AVASTIN) 1.63m/0.05mL SOLN   NDC: 515176-160-73 Lot: [id number] , Expiration date: 04/02/2020   Route: Intravitreal, Site: Left Eye, Waste: 0 mL  Post-op Post injection exam found visual acuity of at least counting fingers. The patient tolerated the procedure well. There were no complications. The patient received written and verbal post procedure care education. Post injection medications were not given.                 ASSESSMENT/PLAN:    ICD-10-CM   1. Moderate nonproliferative diabetic  retinopathy of both eyes with macular edema associated with type 2 diabetes mellitus (HCC)  EX10.6269Intravitreal Injection, Pharmacologic Agent - OS - Left Eye    Bevacizumab (AVASTIN) SOLN 1.25 mg  2. Retinal edema  H35.81 OCT, Retina - OU - Both Eyes  3. Essential hypertension  I10   4. Hypertensive retinopathy of both eyes  H35.033   5. Nuclear sclerosis of both eyes  H25.13     1,2. Moderate non-proliferative diabetic retinopathy w/ DME, OU  - delayed f/u from 4 wks to 3.5 mos (07.02.21 to 10.15.21) -- discussed importance of regular follow up and treatments  - pt lost to f/u from 10.23.2019 to 11.18.20  - FA in 2019 showed leaking MA OU; no NV OU  **of note, pt had pruritic rxn to fluorescein dye -- resolved with 12.5 mg IV benadryl**  - FA 12.16.2020 shows late leaking MA's, non-perfusion defects; no NV OU  - s/p IVA OD #1 (12.16.20), #2 (01.25.21), #3 (05.26.21), #4 (07.02.21), #5 (10.15.21), #6 (11.23.21)  - s/p focal laser OD (03.19.21)  - s/p IVA OS #1 OS (12.23.20), #2 (01.27.21), #3 (03.02.21), #4 (05.26.21), #5 (07.02.21), #6 (10.15.21), #7 (11.23.21)  - exam shows scattered MA, IRH, exudates and mild macular edema OU  - OCT shows persistent cystic changes/IRHM OD -- greatest inf temp; OS: mild interval improvement in IRF  - BCVA stable at 20/25 OU             - Recommend IVA OS #8 today, 01.11.22, will hold IVA OD             - Risks and benefits of tx discussed w/pt             - Pt wishes to proceed w/tx today             - informed consent obtained  - Avastin informed consent form signed and scanned on 01.27.2021             - see procedure note  - f/u 6 weeks, DFE, OCT, possible injection(s), possible focal laser OD  3,4. Hypertensive retinopathy OU  - discussed importance of tight BP control  - monitor  5. Nuclear Sclerosis   - The symptoms of cataract, surgical options, and treatments and risks were discussed with patient.  - discussed diagnosis and  progression  - under the expert management of [facility]  Meds Ordered this visit:  Meds ordered this encounter  Medications  . Bevacizumab (AVASTIN) SOLN 1.25 mg      Return in about 6 weeks (around 04/15/2020) for f/u NPDR OU, DFE, OCT.  There are no Patient Instructions on file for this visit.  Explained the diagnoses, plan, and follow up with the patient and they expressed understanding.  Patient expressed understanding of the importance of proper follow up care.  This document serves as a record of services personally performed by Gardiner Sleeper, MD, PhD. It was created on their behalf by San Jetty. Owens Shark, OA an ophthalmic technician. The creation of this record is the provider's dictation and/or activities during the visit.    Electronically signed by: San Jetty. Marguerita Merles 01.11.2022 11:45 PM  Gardiner Sleeper, M.D., Ph.D. Diseases & Surgery of the [facility]ina and [place] 03/04/2020   I have reviewed the above documentation for accuracy and completeness, and I agree with the above. Gardiner Sleeper, M.D., Ph.D. 03/05/20 11:45 PM  Abbreviations: M myopia (nearsighted); A astigmatism; H hyperopia (farsighted); P presbyopia; Mrx spectacle prescription;  CTL contact lenses; OD right eye; OS left eye; OU both eyes  XT exotropia; ET esotropia; PEK punctate epithelial keratitis; PEE punctate epithelial erosions; DES dry eye syndrome; MGD meibomian gland dysfunction; ATs artificial tears; PFAT's preservative free artificial tears; [place] nuclear sclerotic cataract; PSC posterior subcapsular cataract; ERM epi-retinal membrane; PVD posterior vitreous detachment; RD retinal detachment; DM diabetes mellitus; DR diabetic retinopathy; NPDR non-proliferative diabetic retinopathy; PDR proliferative diabetic retinopathy; CSME clinically significant macular edema; DME diabetic macular edema; dbh dot blot hemorrhages; CWS cotton wool spot; POAG primary open angle  glaucoma; C/D cup-to-disc ratio; HVF humphrey visual field; GVF goldmann visual field; OCT optical coherence tomography; IOP intraocular pressure; BRVO Branch retinal vein occlusion; CRVO central retinal vein occlusion; CRAO central retinal artery occlusion; BRAO branch retinal artery occlusion; RT retinal tear; SB scleral buckle; PPV pars plana vitrectomy; VH Vitreous hemorrhage; PRP panretinal laser photocoagulation; IVK intravitreal kenalog; VMT vitreomacular traction; MH Macular hole;  NVD neovascularization of the disc; NVE neovascularization elsewhere; AREDS age related eye disease study; ARMD age related macular degeneration; POAG primary open angle glaucoma; EBMD epithelial/anterior basement membrane dystrophy; ACIOL anterior chamber intraocular lens; IOL intraocular lens; PCIOL posterior chamber intraocular lens; Phaco/IOL phacoemulsification with intraocular lens placement; [place] photorefractive keratectomy; LASIK laser assisted in situ keratomileusis; HTN hypertension; DM diabetes mellitus; COPD chronic obstructive pulmonary disease

## 2020-03-04 ENCOUNTER — Ambulatory Visit (INDEPENDENT_AMBULATORY_CARE_PROVIDER_SITE_OTHER): Payer: Medicaid Other | Admitting: Ophthalmology

## 2020-03-04 ENCOUNTER — Encounter (INDEPENDENT_AMBULATORY_CARE_PROVIDER_SITE_OTHER): Payer: Self-pay | Admitting: Ophthalmology

## 2020-03-04 ENCOUNTER — Other Ambulatory Visit: Payer: Self-pay

## 2020-03-04 DIAGNOSIS — I10 Essential (primary) hypertension: Secondary | ICD-10-CM

## 2020-03-04 DIAGNOSIS — H3581 Retinal edema: Secondary | ICD-10-CM

## 2020-03-04 DIAGNOSIS — E113313 Type 2 diabetes mellitus with moderate nonproliferative diabetic retinopathy with macular edema, bilateral: Secondary | ICD-10-CM | POA: Diagnosis not present

## 2020-03-04 DIAGNOSIS — H2513 Age-related nuclear cataract, bilateral: Secondary | ICD-10-CM

## 2020-03-04 DIAGNOSIS — H35033 Hypertensive retinopathy, bilateral: Secondary | ICD-10-CM

## 2020-03-05 DIAGNOSIS — E113313 Type 2 diabetes mellitus with moderate nonproliferative diabetic retinopathy with macular edema, bilateral: Secondary | ICD-10-CM

## 2020-03-05 MED ORDER — BEVACIZUMAB CHEMO INJECTION 1.25MG/0.05ML SYRINGE FOR KALEIDOSCOPE
1.2500 mg | INTRAVITREAL | Status: AC | PRN
  Administered 2020-03-05: 1.25 mg via INTRAVITREAL

## 2020-04-09 NOTE — Progress Notes (Shared)
[facility] Note  04/15/2020     CHIEF COMPLAINT Patient presents for No chief complaint on file.   HISTORY OF PRESENT ILLNESS: Ana Long is a 45 y.o. female who presents to the clinic today for:   Referring physician: [name] MD [place], [place] [id number]  HISTORICAL INFORMATION:   Selected notes from the [facility]-referred by Dr. [name] for DM exam   CURRENT MEDICATIONS: Current Outpatient Medications (Ophthalmic Drugs)  Medication Sig  . Olopatadine HCl 0.2 % SOLN Place one drop into both eyes daily.   No current facility-administered medications for this visit. (Ophthalmic Drugs)   Current Outpatient Medications (Other)  Medication Sig  . acyclovir (ZOVIRAX) 400 MG tablet TAKE 1 TABLET BY MOUTH TWICE A DAY  . albuterol (PROAIR HFA) 108 (90 Base) MCG/ACT inhaler Inhale 2 puffs into the lungs every 4 (four) hours as needed for wheezing or shortness of breath (cough). (Patient taking differently: Inhale 2 puffs into the lungs every 4 (four) hours as needed for wheezing or shortness of breath (or coughing).)  . atorvastatin (LIPITOR) 40 MG tablet Take 1 tablet (40 mg total) by mouth at bedtime.  . benzonatate (TESSALON) 200 MG capsule Take 1 capsule (200 mg total) by mouth 3 (three) times daily as needed for cough.  . Blood Glucose Monitoring Suppl (ACCU-CHEK AVIVA PLUS) w/Device KIT Please check you blood sugars 4 times a day  . dapagliflozin propanediol (FARXIGA) 5 MG TABS tablet Take 5 mg by mouth daily. (Patient taking differently: Take 5 mg by mouth 2 (two) times a day.)  . DERMA-SMOOTHE/FS SCALP 0.01 % OIL Apply topically.  . fluconazole (DIFLUCAN) 150 MG tablet TAKE 1 TABLET BY MOUTH NOW. THEN REPEAT IN 1 WEEK  . FLUoxetine (PROZAC) 20 MG capsule Take by mouth.  . furosemide (LASIX) 20 MG tablet Take 20 mg by mouth daily as needed for fluid or edema.   Marland Kitchen glucose blood (ACCU-CHEK SMARTVIEW)  test strip TEST FOUR TIMES DAILY  . hydrochlorothiazide (HYDRODIURIL) 12.5 MG tablet Take by mouth.  . insulin aspart (NOVOLOG) 100 UNIT/ML injection Inject 0-20 Units into the skin every 4 (four) hours. CBG 70 - 120: 0 units  CBG 121 - 150: 3 units  CBG 151 - 200: 4 units  CBG 201 - 250: 7 units  CBG 251 - 300: 11 units  CBG 301 - 350: 15 units  CBG 351 - 400: 20 units  CBG > 400 call MD (Patient taking differently: Inject 20 Units into the skin every 4 (four) hours. CBG 70 - 120: 0 units  CBG 121 - 150: 3 units  CBG 151 - 200: 4 units  CBG 201 - 250: 7 units  CBG 251 - 300: 11 units  CBG 301 - 350: 15 units  CBG 351 - 400: 20 units  CBG > 400 call MD)  . insulin aspart protamine - aspart (NOVOLOG MIX 70/30 FLEXPEN) (70-30) 100 UNIT/ML FlexPen Inject into the skin.  . Insulin Glargine (LANTUS) 100 UNIT/ML Solostar Pen Inject 80 Units into the skin 2 (two) times daily.  . Insulin Pen Needle (B-D UF III MINI PEN NEEDLES) 31G X 5 MM MISC USE AS DIRECTED TO INJECT INSULIN FIVE A DAY E11.65  . JANUVIA 100 MG tablet TAKE 1 TABLET BY MOUTH EVERY DAY  . lactulose (CEPHULAC) 10 g packet Take 1 packet (10 g total) by mouth daily as needed. Reported on 07/29/2015 (Patient  taking differently: Take 10 g by mouth daily as needed (for constipation).)  . Lancets (ACCU-CHEK SOFT TOUCH) lancets Use as instructed  . liraglutide (VICTOZA) 18 MG/3ML SOPN Take 0.55m daily for 1 week, then 1.262mdaily for 1 week, then 1.9m65maily.  . lMarland Kitchenratadine (CLARITIN) 10 MG tablet Take 1 tablet (10 mg total) by mouth 2 (two) times daily.  . methocarbamol (ROBAXIN) 500 MG tablet Take 1 tablet (500 mg total) by mouth every 8 (eight) hours as needed. (Patient taking differently: Take 500 mg by mouth every 8 (eight) hours as needed for muscle spasms.)  . mometasone (NASONEX) 50 MCG/ACT nasal spray Place into the nose.  . mometasone-formoterol (DULERA) 200-5 MCG/ACT AERO Inhale 2 puffs into the lungs 2 (two) times daily.  .  montelukast (SINGULAIR) 10 MG tablet Take 1 tablet (10 mg total) by mouth at bedtime.  . nitroGLYCERIN (NITRODUR - DOSED IN MG/24 HR) 0.2 mg/hr patch Apply 1/4th patch to affected achilles, change daily  . omeprazole (PRILOSEC) 20 MG capsule Take 1 capsule (20 mg total) by mouth daily before breakfast.  . SPIRIVA RESPIMAT 1.25 MCG/ACT AERS SMARTSIG:2 Puff(s) Via Inhaler Daily  . topiramate (TOPAMAX) 25 MG tablet Take by mouth.   Current Facility-Administered Medications (Other)  Medication Route  . diphenhydrAMINE (BENADRYL) injection 12.5 mg Intramuscular      REVIEW OF SYSTEMS:    ALLERGIES Allergies  Allergen Reactions  . Bee Venom Anaphylaxis and Swelling  . Wasp Venom Anaphylaxis and Swelling  . Citrus Hives and Itching  . Latex Hives and Swelling  . Penicillins Hives and Itching    Has patient had a PCN reaction causing immediate rash, facial/tongue/throat swelling, SOB or lightheadedness with hypotension: Yes Has patient had a PCN reaction causing severe rash involving mucus membranes or skin necrosis: Unk Has patient had a PCN reaction that required hospitalization: Was already in hosp Has patient had a PCN reaction occurring within the last 10 years: No If all of the above answers are "NO", then may proceed with Cephalosporin use.   . Shellfish Allergy Other (See Comments) and Swelling    Patient is uncertain; stated that she tolerates this (??)  . Soap Hives, Itching and Swelling    PUREX LAUNDRY DETERGENT  . Tomato Hives and Itching  . Banana Itching and Nausea Only  . Chocolate Itching  . Fluticasone Other (See Comments)    Per patient, makes sneeze and gag  . Hydrocodone Itching and Nausea Only  . Other Itching    Acidic foods- Itching [place]- Itching in the roof of the mouth  . Adhesive [Tape] Itching and Rash    Allergic to  "leads"  . Doxycycline Other (See Comments)    AFFECTS JOINTS  . Metformin And Related Nausea Only, Rash, Other (See Comments) and  Diarrhea    GI UPSET, also  . Red Dye Itching, Rash and Other (See Comments)    At eye doctor's office, the dilating drops- Skin breaks out, chest turned red, itching, face became swollen    PAST MEDICAL HISTORY Past Medical History:  Diagnosis Date  . Acute nonintractable headache 12/26/2017  . Arthritis    major joints  . Asthma    prn inhaler  . Asthma exacerbation 07/25/2013  . Asthma in adult, severe persistent, with acute exacerbation 01/11/2018  . Carpal tunnel syndrome of right wrist 09/2012  . Cataract    NS OU  . Complication of anesthesia    states is hard to wake up post-op  .  Constipation 12/30/2014  . Diabetes mellitus    intolerant to Metformin  . Diabetic retinopathy ([place])    NPDR OU  . Dry skin    hands   . Family history of anesthesia complication    mother went into coma during c-section  . Genital herpes 01/01/2006   on acyclovir BID   . GERD (gastroesophageal reflux disease)   . Herpes genital   . Hyperglycemia 12/26/2017  . Hypertensive retinopathy    OU  . Low back pain 11/22/2012  . Lower extremity edema 10/18/2012  . Menorrhagia 11/06/2015  . Morbid obesity with BMI of 50 to 60 07/23/2009  . Obesity hypoventilation syndrome ([place]) 10/30/2012  . Obesity, morbid ([place]) 01/10/2014  . OSA on CPAP     AHI was on  11-17-12 to 120/hr, titrated to 15 cm with 3 cm EPR/   . PCOS (polycystic ovarian syndrome)   . Rheumatoid arthritis ([place])   . Right ankle pain 06/19/2015  . Right hip pain 09/04/2017  . Right shoulder pain 06/01/2017  . Seasonal allergies   . Sleep apnea, obstructive    uses CPAP nightly - had sleep study 08/22/2007   Past Surgical History:  Procedure Laterality Date  . CARPAL TUNNEL RELEASE Right 10/02/2012   Procedure: RIGHT CARPAL TUNNEL RELEASE ENDOSCOPIC;  Surgeon: Jolyn Nap, MD;  Location: [facility];  Service: Orthopedics;  Laterality: Right;  . CHOLECYSTECTOMY N/A 12/02/2014   Procedure: LAPAROSCOPIC CHOLECYSTECTOMY;   Surgeon: Armandina Gemma, MD;  Location: [facility];  Service: General;  Laterality: N/A;  . HYSTEROSCOPY WITH D & C  01-08-2002    FAMILY HISTORY Family History  Problem Relation Age of Onset  . Other Mother   . Leukemia Cousin   . Other Other   . Obesity Other   . Other Other   . Diabetes Maternal Aunt   . Heart attack Maternal Aunt   . Alzheimer's disease Maternal Uncle   . Cancer Maternal Uncle        prostate  . Diabetes Paternal Aunt   . Cancer Paternal Aunt        brain,mouth  . Alzheimer's disease Paternal Aunt   . Diabetes Paternal Uncle   . Diabetes Maternal Grandmother   . Diabetes Maternal Grandfather     SOCIAL HISTORY Social History   Tobacco Use  . Smoking status: Former Smoker    Years: 10.00    Quit date: 05/24/2010    Years since quitting: 9.8  . Smokeless tobacco: Never Used  Vaping Use  . Vaping Use: Never used  Substance Use Topics  . Alcohol use: Yes    Alcohol/week: 0.0 standard drinks    Comment: socially  . Drug use: No         OPHTHALMIC EXAM:  Not recorded     IMAGING AND PROCEDURES  Imaging and Procedures for @TODAY @           ASSESSMENT/PLAN:  No diagnosis found.  1,2. Moderate non-proliferative diabetic retinopathy w/ DME, OU  - delayed f/u from 4 wks to 3.5 mos (07.02.21 to 10.15.21) -- discussed importance of regular follow up and treatments  - pt lost to f/u from 10.23.2019 to 11.18.20  - FA in 2019 showed leaking MA OU; no NV OU  **of note, pt had pruritic rxn to fluorescein dye -- resolved with 12.5 mg IV benadryl**  - FA 12.16.2020 shows late leaking MA's, non-perfusion defects; no NV OU  - s/p IVA OD #1 (12.16.20), #2 (01.25.21), #  3 (05.26.21), #4 (07.02.21), #5 (10.15.21), #6 (11.23.21)  - s/p focal laser OD (03.19.21)  - s/p IVA OS #1 OS (12.23.20), #2 (01.27.21), #3 (03.02.21), #4 (05.26.21), #5 (07.02.21), #6 (10.15.21), #7 (11.23.21), #8 (1.11.22)  - exam shows scattered MA, IRH, exudates and mild macular edema  OU  - OCT shows persistent cystic changes/IRHM OD -- greatest inf temp; OS: mild interval improvement in IRF  - BCVA stable at 20/25 OU             - Recommend IVA OS #9 today, 2.22.22, will hold IVA OD             - Risks and benefits of tx discussed w/pt             - Pt wishes to proceed w/tx today             - informed consent obtained  - [place] informed consent form signed and scanned on 01.27.2021             - see procedure note  - f/u 6 weeks, DFE, OCT, possible injection(s), possible focal laser OD  3,4. Hypertensive retinopathy OU  - discussed importance of tight BP control  - monitor  5. Nuclear Sclerosis   - The symptoms of cataract, surgical options, and treatments and risks were discussed with patient.  - discussed diagnosis and progression  - under the expert management of [place] Ordered this visit:  No orders of the defined types were placed in this encounter.     No follow-ups on file.  There are no Patient Instructions on file for this visit.  Explained the diagnoses, plan, and follow up with the patient and they expressed understanding.  Patient expressed understanding of the importance of proper follow up care.  This document serves as a record of services personally performed by Gardiner Sleeper, MD, PhD. It was created on their behalf by Estill Bakes, COT an ophthalmic technician. The creation of this record is the provider's dictation and/or activities during the visit.    Electronically signed by: Estill Bakes, COT 2.16.22 @ 9:57 AM  Abbreviations: M myopia (nearsighted); A astigmatism; H hyperopia (farsighted); P presbyopia; Mrx spectacle prescription;  CTL contact lenses; OD right eye; OS left eye; OU both eyes  XT exotropia; ET esotropia; PEK punctate epithelial keratitis; PEE punctate epithelial erosions; DES dry eye syndrome; MGD meibomian gland dysfunction; ATs artificial tears; PFAT's preservative free artificial tears;  [place] nuclear sclerotic cataract; PSC posterior subcapsular cataract; ERM epi-retinal membrane; PVD posterior vitreous detachment; RD retinal detachment; DM diabetes mellitus; DR diabetic retinopathy; NPDR non-proliferative diabetic retinopathy; PDR proliferative diabetic retinopathy; CSME clinically significant macular edema; DME diabetic macular edema; dbh dot blot hemorrhages; CWS cotton wool spot; POAG primary open angle glaucoma; C/D cup-to-disc ratio; HVF humphrey visual field; GVF goldmann visual field; OCT optical coherence tomography; IOP intraocular pressure; BRVO Branch retinal vein occlusion; CRVO central retinal vein occlusion; CRAO central retinal artery occlusion; BRAO branch retinal artery occlusion; RT retinal tear; SB scleral buckle; PPV pars plana vitrectomy; VH Vitreous hemorrhage; PRP panretinal laser photocoagulation; IVK intravitreal kenalog; VMT vitreomacular traction; MH Macular hole;  NVD neovascularization of the disc; NVE neovascularization elsewhere; AREDS age related eye disease study; ARMD age related macular degeneration; POAG primary open angle glaucoma; EBMD epithelial/anterior basement membrane dystrophy; ACIOL anterior chamber intraocular lens; IOL intraocular lens; PCIOL posterior chamber intraocular lens; Phaco/IOL phacoemulsification with intraocular lens placement; PRK photorefractive keratectomy; LASIK laser assisted in  situ keratomileusis; HTN hypertension; DM diabetes mellitus; COPD chronic obstructive pulmonary disease

## 2020-04-15 ENCOUNTER — Encounter (INDEPENDENT_AMBULATORY_CARE_PROVIDER_SITE_OTHER): Payer: Self-pay

## 2020-04-15 ENCOUNTER — Encounter (INDEPENDENT_AMBULATORY_CARE_PROVIDER_SITE_OTHER): Payer: Medicaid Other | Admitting: Ophthalmology

## 2020-04-15 DIAGNOSIS — H3581 Retinal edema: Secondary | ICD-10-CM

## 2020-04-15 DIAGNOSIS — H2513 Age-related nuclear cataract, bilateral: Secondary | ICD-10-CM

## 2020-04-15 DIAGNOSIS — E113313 Type 2 diabetes mellitus with moderate nonproliferative diabetic retinopathy with macular edema, bilateral: Secondary | ICD-10-CM

## 2020-04-15 DIAGNOSIS — H35033 Hypertensive retinopathy, bilateral: Secondary | ICD-10-CM

## 2020-04-15 DIAGNOSIS — I10 Essential (primary) hypertension: Secondary | ICD-10-CM

## 2020-04-16 ENCOUNTER — Encounter (INDEPENDENT_AMBULATORY_CARE_PROVIDER_SITE_OTHER): Payer: Medicaid Other | Admitting: Ophthalmology

## 2020-04-16 NOTE — Progress Notes (Signed)
[facility] Note  04/25/2020     CHIEF COMPLAINT Patient presents for Retina Follow Up   HISTORY OF PRESENT ILLNESS: Ana Long is a 45 y.o. female who presents to the clinic today for:   HPI    Retina Follow Up    Patient presents with  Diabetic Retinopathy.  In both eyes.  Severity is moderate.  Duration of 7 weeks.  Since onset it is stable.  I, the attending physician,  performed the HPI with the patient and updated documentation appropriately.          Comments    Patient states vision the same OU. Was on prednisone at the beginning of February for asthma. BS has been around 180's. Last a1c was elevated at just below 10 or 11, patient can't remember exact number. Patient states that she is more sensitive to bright lights/glare.        Last edited by Bernarda Caffey, MD on 04/26/2020  1:58 AM. (History)    pt states   Referring physician: [name] MD [name], [place] [phone]  HISTORICAL INFORMATION:   Selected notes from the [facility] Re-referred by Dr. [name] for DM exam   CURRENT MEDICATIONS: Current Outpatient Medications (Ophthalmic Drugs)  Medication Sig  . Olopatadine HCl 0.2 % SOLN Place one drop into both eyes daily.   No current facility-administered medications for this visit. (Ophthalmic Drugs)   Current Outpatient Medications (Other)  Medication Sig  . acyclovir (ZOVIRAX) 400 MG tablet TAKE 1 TABLET BY MOUTH TWICE A DAY  . albuterol (PROAIR HFA) 108 (90 Base) MCG/ACT inhaler Inhale 2 puffs into the lungs every 4 (four) hours as needed for wheezing or shortness of breath (cough). (Patient taking differently: Inhale 2 puffs into the lungs every 4 (four) hours as needed for wheezing or shortness of breath (or coughing).)  . atorvastatin (LIPITOR) 40 MG tablet Take 1 tablet (40 mg total) by mouth at bedtime.  . benzonatate (TESSALON) 200 MG capsule Take 1 capsule (200 mg total) by mouth  3 (three) times daily as needed for cough.  . Blood Glucose Monitoring Suppl (ACCU-CHEK AVIVA PLUS) w/Device KIT Please check you blood sugars 4 times a day  . dapagliflozin propanediol (FARXIGA) 5 MG TABS tablet Take 5 mg by mouth daily. (Patient taking differently: Take 5 mg by mouth 2 (two) times a day.)  . DERMA-SMOOTHE/FS SCALP 0.01 % OIL Apply topically.  . fluconazole (DIFLUCAN) 150 MG tablet TAKE 1 TABLET BY MOUTH NOW. THEN REPEAT IN 1 WEEK  . FLUoxetine (PROZAC) 20 MG capsule Take by mouth.  . furosemide (LASIX) 20 MG tablet Take 20 mg by mouth daily as needed for fluid or edema.   Marland Kitchen glucose blood (ACCU-CHEK SMARTVIEW) test strip TEST FOUR TIMES DAILY  . hydrochlorothiazide (HYDRODIURIL) 12.5 MG tablet Take by mouth.  . insulin aspart (NOVOLOG) 100 UNIT/ML injection Inject 0-20 Units into the skin every 4 (four) hours. CBG 70 - 120: 0 units  CBG 121 - 150: 3 units  CBG 151 - 200: 4 units  CBG 201 - 250: 7 units  CBG 251 - 300: 11 units  CBG 301 - 350: 15 units  CBG 351 - 400: 20 units  CBG > 400 call MD (Patient taking differently: Inject 20 Units into the skin every 4 (four) hours. CBG 70 - 120: 0 units  CBG 121 - 150: 3 units  CBG 151 - 200: 4  units  CBG 201 - 250: 7 units  CBG 251 - 300: 11 units  CBG 301 - 350: 15 units  CBG 351 - 400: 20 units  CBG > 400 call MD)  . insulin aspart protamine - aspart (NOVOLOG MIX 70/30 FLEXPEN) (70-30) 100 UNIT/ML FlexPen Inject into the skin.  . Insulin Pen Needle (B-D UF III MINI PEN NEEDLES) 31G X 5 MM MISC USE AS DIRECTED TO INJECT INSULIN FIVE A DAY E11.65  . JANUVIA 100 MG tablet TAKE 1 TABLET BY MOUTH EVERY DAY  . lactulose (CEPHULAC) 10 g packet Take 1 packet (10 g total) by mouth daily as needed. Reported on 07/29/2015 (Patient taking differently: Take 10 g by mouth daily as needed (for constipation).)  . Lancets (ACCU-CHEK SOFT TOUCH) lancets Use as instructed  . liraglutide (VICTOZA) 18 MG/3ML SOPN Take 0.67m daily for 1 week, then  1.285mdaily for 1 week, then 1.81m1081maily.  . lMarland Kitchenratadine (CLARITIN) 10 MG tablet Take 1 tablet (10 mg total) by mouth 2 (two) times daily.  . methocarbamol (ROBAXIN) 500 MG tablet Take 1 tablet (500 mg total) by mouth every 8 (eight) hours as needed. (Patient taking differently: Take 500 mg by mouth every 8 (eight) hours as needed for muscle spasms.)  . mometasone (NASONEX) 50 MCG/ACT nasal spray Place into the nose.  . mometasone-formoterol (DULERA) 200-5 MCG/ACT AERO Inhale 2 puffs into the lungs 2 (two) times daily.  . montelukast (SINGULAIR) 10 MG tablet Take 1 tablet (10 mg total) by mouth at bedtime.  . nitroGLYCERIN (NITRODUR - DOSED IN MG/24 HR) 0.2 mg/hr patch Apply 1/4th patch to affected achilles, change daily  . omeprazole (PRILOSEC) 20 MG capsule Take 1 capsule (20 mg total) by mouth daily before breakfast.  . SPIRIVA RESPIMAT 1.25 MCG/ACT AERS SMARTSIG:2 Puff(s) Via Inhaler Daily  . topiramate (TOPAMAX) 25 MG tablet Take by mouth.  . Insulin Glargine (LANTUS) 100 UNIT/ML Solostar Pen Inject 80 Units into the skin 2 (two) times daily.   Current Facility-Administered Medications (Other)  Medication Route  . diphenhydrAMINE (BENADRYL) injection 12.5 mg Intramuscular      REVIEW OF SYSTEMS: ROS    Positive for: Gastrointestinal, Musculoskeletal, Endocrine, Eyes, Respiratory   Negative for: Constitutional, Neurological, Skin, Genitourinary, HENT, Cardiovascular, Psychiatric, Allergic/Imm, Heme/Lymph   Last edited by BarRoselee Nova COT on 04/25/2020  9:38 AM. (History)       ALLERGIES Allergies  Allergen Reactions  . Bee Venom Anaphylaxis and Swelling  . Wasp Venom Anaphylaxis and Swelling  . Citrus Hives and Itching  . Latex Hives and Swelling  . Penicillins Hives and Itching    Has patient had a PCN reaction causing immediate rash, facial/tongue/throat swelling, SOB or lightheadedness with hypotension: Yes Has patient had a PCN reaction causing severe rash involving  mucus membranes or skin necrosis: Unk Has patient had a PCN reaction that required hospitalization: Was already in hosp Has patient had a PCN reaction occurring within the last 10 years: No If all of the above answers are "NO", then may proceed with Cephalosporin use.   . Shellfish Allergy Other (See Comments) and Swelling    Patient is uncertain; stated that she tolerates this (??)  . Soap Hives, Itching and Swelling    PUREX LAUNDRY DETERGENT  . Tomato Hives and Itching  . Banana Itching and Nausea Only  . Chocolate Itching  . Fluticasone Other (See Comments)    Per patient, makes sneeze and gag  . Hydrocodone Itching and Nausea Only  .  Other Itching    Acidic foods- Itching [place]- Itching in the roof of the mouth  . Adhesive [Tape] Itching and Rash    Allergic to  "leads"  . Doxycycline Other (See Comments)    AFFECTS JOINTS  . Metformin And Related Nausea Only, Rash, Other (See Comments) and Diarrhea    GI UPSET, also  . Red Dye Itching, Rash and Other (See Comments)    At eye doctor's office, the dilating drops- Skin breaks out, chest turned red, itching, face became swollen    PAST MEDICAL HISTORY Past Medical History:  Diagnosis Date  . Acute nonintractable headache 12/26/2017  . Arthritis    major joints  . Asthma    prn inhaler  . Asthma exacerbation 07/25/2013  . Asthma in adult, severe persistent, with acute exacerbation 01/11/2018  . Carpal tunnel syndrome of right wrist 09/2012  . Cataract    NS OU  . Complication of anesthesia    states is hard to wake up post-op  . Constipation 12/30/2014  . Diabetes mellitus    intolerant to Metformin  . Diabetic retinopathy ([place])    NPDR OU  . Dry skin    hands   . Family history of anesthesia complication    mother went into coma during c-section  . Genital herpes 01/01/2006   on acyclovir BID   . GERD (gastroesophageal reflux disease)   . Herpes genital   . Hyperglycemia 12/26/2017  . Hypertensive retinopathy     OU  . Low back pain 11/22/2012  . Lower extremity edema 10/18/2012  . Menorrhagia 11/06/2015  . Morbid obesity with BMI of 50 to 60 07/23/2009  . Obesity hypoventilation syndrome ([place]) 10/30/2012  . Obesity, morbid ([place]) 01/10/2014  . OSA on CPAP     AHI was on  11-17-12 to 120/hr, titrated to 15 cm with 3 cm EPR/   . PCOS (polycystic ovarian syndrome)   . Rheumatoid arthritis ([place])   . Right ankle pain 06/19/2015  . Right hip pain 09/04/2017  . Right shoulder pain 06/01/2017  . Seasonal allergies   . Sleep apnea, obstructive    uses CPAP nightly - had sleep study 08/22/2007   Past Surgical History:  Procedure Laterality Date  . CARPAL TUNNEL RELEASE Right 10/02/2012   Procedure: RIGHT CARPAL TUNNEL RELEASE ENDOSCOPIC;  Surgeon: Jolyn Nap, MD;  Location: [facility];  Service: Orthopedics;  Laterality: Right;  . CHOLECYSTECTOMY N/A 12/02/2014   Procedure: LAPAROSCOPIC CHOLECYSTECTOMY;  Surgeon: Armandina Gemma, MD;  Location: [facility];  Service: General;  Laterality: N/A;  . HYSTEROSCOPY WITH D & C  01-08-2002    FAMILY HISTORY Family History  Problem Relation Age of Onset  . Other Mother   . Leukemia Cousin   . Other Other   . Obesity Other   . Other Other   . Diabetes Maternal Aunt   . Heart attack Maternal Aunt   . Alzheimer's disease Maternal Uncle   . Cancer Maternal Uncle        prostate  . Diabetes Paternal Aunt   . Cancer Paternal Aunt        brain,mouth  . Alzheimer's disease Paternal Aunt   . Diabetes Paternal Uncle   . Diabetes Maternal Grandmother   . Diabetes Maternal Grandfather     SOCIAL HISTORY Social History   Tobacco Use  . Smoking status: Former Smoker    Years: 10.00    Quit date: 05/24/2010    Years since quitting: 9.9  .  Smokeless tobacco: Never Used  Vaping Use  . Vaping Use: Never used  Substance Use Topics  . Alcohol use: Yes    Alcohol/week: 0.0 standard drinks    Comment: socially  . Drug use: No         OPHTHALMIC  EXAM:  Base Eye Exam    Visual Acuity (Snellen - Linear)      Right Left   Dist cc 20/40 -2 20/40 -1   Dist ph cc 20/20 -1 20/25 +1   Correction: Glasses       Tonometry (Tonopen, 9:45 AM)      Right Left   Pressure 18 16       Pupils      Dark Light Shape React APD   Right 3 2 Round Brisk None   Left 3 2 Round Brisk None       Visual Fields    Counting fingers       Extraocular Movement      Right Left    Full, Ortho Full, Ortho       Neuro/Psych    Oriented x3: Yes   Mood/Affect: Normal       Dilation    Both eyes: 1.0% Mydriacyl, 2.5% Phenylephrine @ 9:45 AM        Slit Lamp and Fundus Exam    Slit Lamp Exam      Right Left   Lids/Lashes Dermatochalasis - upper lid, mild Meibomian gland dysfunction Dermatochalasis - upper lid, mild Meibomian gland dysfunction   Conjunctiva/Sclera White and quiet White and quiet   Cornea 1-2+Punctate epithelial erosions 2-3+fine Punctate epithelial erosions   Anterior Chamber deep and quiet deep and quiet   Iris round and dilated, No NVI round and dilated   Lens 1+ NS, 1+CC 1+NS, 1+CC   Vitreous mild vitreous syneresis mild vitreous syneresis       Fundus Exam      Right Left   Disc Pink and sharp Pink and sharp   C/D Ratio 0.7 0.6   Macula good foveal reflex, focal exudates inferior to fovea and scattered superior macula, scattered MA, focal cystic changes IN macula good foveal reflex, scatttered MA's and exudate, central cystic changes -- persistent, focal cluster of exudates temporal macula   Vessels attenuated, Tortuous Vascular attenuation, Tortuous, Copper wiring   Periphery Attached, scattered MA/exudates greatest nasally Attached, scattered MA and exudate greatest superior to disc, white without pressure temporally        Refraction    Wearing Rx      Sphere Cylinder Axis   Right -2.50 +1.50 013   Left -2.25 Sphere    Type: SVL          IMAGING AND PROCEDURES  Imaging and Procedures for  _0 @  OCT, Retina - OU - Both Eyes       Right Eye Quality was good. Central Foveal Thickness: 216. Progression has been stable. Findings include normal foveal contour, no SRF, intraretinal fluid, intraretinal hyper-reflective material (Mild persistent IRF IN macula).   Left Eye Quality was good. Central Foveal Thickness: 253. Progression has been stable. Findings include intraretinal fluid, no SRF, abnormal foveal contour, intraretinal hyper-reflective material (Persistent IRF nasal macula).   Notes *Images captured and stored on drive  Diagnosis / Impression:  +DME OU-- persistent IRF/IRHM OD: Mild persistent IRF IN macula OS: Persistent IRF nasal macula  Clinical management:  See below  Abbreviations: NFP - Normal foveal profile. CME - cystoid macular edema. PED - pigment epithelial  detachment. IRF - intraretinal fluid. SRF - subretinal fluid. EZ - ellipsoid zone. ERM - epiretinal membrane. ORA - outer retinal atrophy. ORT - outer retinal tubulation. SRHM - subretinal hyper-reflective material        Intravitreal Injection, Pharmacologic Agent - OS - Left Eye       Time Out 04/25/2020. 10:05 AM. Confirmed correct patient, procedure, site, and patient consented.   Anesthesia Topical anesthesia was used. Anesthetic medications included Lidocaine 2%, Proparacaine 0.5%.   Procedure Preparation included 5% betadine to ocular surface, eyelid speculum. A (32g) needle was used.   Injection:  1.25 mg Bevacizumab (AVASTIN) 1.30m/0.05mL SOLN   NDC: 525427-062-37 Lot: [id number] Expiration date: 06/04/2020   Route: Intravitreal, Site: Left Eye, Waste: 0 mL  Post-op Post injection exam found visual acuity of at least counting fingers. The patient tolerated the procedure well. There were no complications. The patient received written and verbal post procedure care education. Post injection medications were not given.                 ASSESSMENT/PLAN:    ICD-10-CM   1.  Moderate nonproliferative diabetic retinopathy of both eyes with macular edema associated with type 2 diabetes mellitus (HCC)  EO16.0737Intravitreal Injection, Pharmacologic Agent - OS - Left Eye    Bevacizumab (AVASTIN) SOLN 1.25 mg  2. Retinal edema  H35.81 OCT, Retina - OU - Both Eyes  3. Essential hypertension  I10   4. Hypertensive retinopathy of both eyes  H35.033   5. Nuclear sclerosis of both eyes  H25.13     1,2. Moderate non-proliferative diabetic retinopathy w/ DME, OU  - delayed f/u from 6 wks to 7 wks  - delayed f/u from 4 wks to 3.5 mos (07.02.21 to 10.15.21) -- discussed importance of regular follow up and treatments  - pt lost to f/u from 10.23.2019 to 11.18.20  - FA in 2019 showed leaking MA OU; no NV OU  **of note, pt had pruritic rxn to fluorescein dye -- resolved with 12.5 mg IV benadryl**  - FA 12.16.2020 shows late leaking MA's, non-perfusion defects; no NV OU  - s/p IVA OD #1 (12.16.20), #2 (01.25.21), #3 (05.26.21), #4 (07.02.21), #5 (10.15.21), #6 (11.23.21)  - s/p focal laser OD (03.19.21)  - s/p IVA OS #1 OS (12.23.20), #2 (01.27.21), #3 (03.02.21), #4 (05.26.21), #5 (07.02.21), #6 (10.15.21), #7 (11.23.21), #8 (01.11.22)  - exam shows scattered MA, IRH, exudates and mild macular edema OU  - OCT shows OD: Mild persistent IRF IN macula; OS: Persistent IRF nasal macula  - BCVA OD: 20/20; OS: 20/25 (stable)             - Recommend IVA OS #9 today, 03.04.22, will hold IVA OD             - Risks and benefits of tx discussed w/pt             - Pt wishes to proceed w/tx today             - informed consent obtained  - Avastin informed consent form signed and scanned on 01.27.2021             - see procedure note  - f/u 3 weeks, DFE, OCT, focal laser OD -- inf nasal mac  3,4. Hypertensive retinopathy OU  - discussed importance of tight BP control  - monitor  5. Nuclear Sclerosis   - The symptoms of cataract, surgical options, and treatments and risks were  discussed with  patient.  - discussed diagnosis and progression  - under the expert management of [place] Ordered this visit:  Meds ordered this encounter  Medications  . Bevacizumab (AVASTIN) SOLN 1.25 mg      Return in about 3 weeks (around 05/16/2020) for f/u NPDR OU, DFE, OCT, focal laser OD.  There are no Patient Instructions on file for this visit.  Explained the diagnoses, plan, and follow up with the patient and they expressed understanding.  Patient expressed understanding of the importance of proper follow up care.  This document serves as a record of services personally performed by Gardiner Sleeper, MD, PhD. It was created on their behalf by San Jetty. Owens Shark, OA an ophthalmic technician. The creation of this record is the provider's dictation and/or activities during the visit.    Electronically signed by: San Jetty. Owens Shark, [place] 02.23.2022 2:02 AM  Gardiner Sleeper, M.D., Ph.D. Diseases & Surgery of the Retina and Vitreous [facility]  I have reviewed the above documentation for accuracy and completeness, and I agree with the above. Gardiner Sleeper, M.D., Ph.D. 04/26/20 2:02 AM   Abbreviations: M myopia (nearsighted); A astigmatism; H hyperopia (farsighted); P presbyopia; Mrx spectacle prescription;  CTL contact lenses; OD right eye; OS left eye; OU both eyes  XT exotropia; ET esotropia; PEK punctate epithelial keratitis; PEE punctate epithelial erosions; DES dry eye syndrome; MGD meibomian gland dysfunction; ATs artificial tears; PFAT's preservative free artificial tears; [place] nuclear sclerotic cataract; PSC posterior subcapsular cataract; ERM epi-retinal membrane; PVD posterior vitreous detachment; RD retinal detachment; DM diabetes mellitus; DR diabetic retinopathy; NPDR non-proliferative diabetic retinopathy; PDR proliferative diabetic retinopathy; CSME clinically significant macular edema; DME diabetic macular edema; dbh dot blot  hemorrhages; CWS cotton wool spot; POAG primary open angle glaucoma; C/D cup-to-disc ratio; HVF humphrey visual field; GVF goldmann visual field; OCT optical coherence tomography; IOP intraocular pressure; BRVO Branch retinal vein occlusion; CRVO central retinal vein occlusion; CRAO central retinal artery occlusion; BRAO branch retinal artery occlusion; RT retinal tear; SB scleral buckle; PPV pars plana vitrectomy; VH Vitreous hemorrhage; PRP panretinal laser photocoagulation; IVK intravitreal kenalog; VMT vitreomacular traction; MH Macular hole;  NVD neovascularization of the disc; NVE neovascularization elsewhere; AREDS age related eye disease study; ARMD age related macular degeneration; POAG primary open angle glaucoma; EBMD epithelial/anterior basement membrane dystrophy; ACIOL anterior chamber intraocular lens; IOL intraocular lens; PCIOL posterior chamber intraocular lens; Phaco/IOL phacoemulsification with intraocular lens placement; [place] photorefractive keratectomy; LASIK laser assisted in situ keratomileusis; HTN hypertension; DM diabetes mellitus; COPD chronic obstructive pulmonary disease

## 2020-04-25 ENCOUNTER — Ambulatory Visit (INDEPENDENT_AMBULATORY_CARE_PROVIDER_SITE_OTHER): Payer: Medicaid Other | Admitting: Ophthalmology

## 2020-04-25 ENCOUNTER — Other Ambulatory Visit: Payer: Self-pay

## 2020-04-25 ENCOUNTER — Encounter (INDEPENDENT_AMBULATORY_CARE_PROVIDER_SITE_OTHER): Payer: Self-pay | Admitting: Ophthalmology

## 2020-04-25 DIAGNOSIS — I10 Essential (primary) hypertension: Secondary | ICD-10-CM | POA: Diagnosis not present

## 2020-04-25 DIAGNOSIS — E113313 Type 2 diabetes mellitus with moderate nonproliferative diabetic retinopathy with macular edema, bilateral: Secondary | ICD-10-CM | POA: Diagnosis not present

## 2020-04-25 DIAGNOSIS — H35033 Hypertensive retinopathy, bilateral: Secondary | ICD-10-CM

## 2020-04-25 DIAGNOSIS — H2513 Age-related nuclear cataract, bilateral: Secondary | ICD-10-CM

## 2020-04-25 DIAGNOSIS — H3581 Retinal edema: Secondary | ICD-10-CM | POA: Diagnosis not present

## 2020-04-26 ENCOUNTER — Encounter (INDEPENDENT_AMBULATORY_CARE_PROVIDER_SITE_OTHER): Payer: Self-pay | Admitting: Ophthalmology

## 2020-04-26 DIAGNOSIS — E113313 Type 2 diabetes mellitus with moderate nonproliferative diabetic retinopathy with macular edema, bilateral: Secondary | ICD-10-CM | POA: Diagnosis not present

## 2020-04-26 MED ORDER — BEVACIZUMAB CHEMO INJECTION 1.25MG/0.05ML SYRINGE FOR KALEIDOSCOPE
1.2500 mg | INTRAVITREAL | Status: AC | PRN
  Administered 2020-04-26: 1.25 mg via INTRAVITREAL

## 2020-05-22 NOTE — Progress Notes (Addendum)
[facility] Note  05/23/2020     CHIEF COMPLAINT Patient presents for Retina Follow Up   HISTORY OF PRESENT ILLNESS: Ana Long is a 45 y.o. female who presents to the clinic today for:   HPI    Retina Follow Up    Patient presents with  Diabetic Retinopathy.  In both eyes.  This started years ago.  Severity is moderate.  Duration of 4 weeks.  Since onset it is stable.  I, the attending physician,  performed the HPI with the patient and updated documentation appropriately.          Comments    45 y/o female pt here for 4 wk f/u for mod NPDR w/DME OU and focal laser OD.  No change in [place] OU.  Denies pain, FOL, floaters.  Still a bit light sensitive.  No gtts.  BS 148 this a.m.  A1C unknown.       Last edited by Bernarda Caffey, MD on 05/23/2020  4:21 PM. (History)    pt states   Referring physician: [name] MD [place], [place] [address]  HISTORICAL INFORMATION:   Selected notes from the [facility] Re-referred by Dr. [name] for DM exam   CURRENT MEDICATIONS: Current Outpatient Medications (Ophthalmic Drugs)  Medication Sig  . Olopatadine HCl 0.2 % SOLN Place one drop into both eyes daily.  . prednisoLONE acetate (PRED FORTE) 1 % ophthalmic suspension Place 1 drop into the right eye 4 (four) times daily for 7 days.   No current facility-administered medications for this visit. (Ophthalmic Drugs)   Current Outpatient Medications (Other)  Medication Sig  . acyclovir (ZOVIRAX) 400 MG tablet TAKE 1 TABLET BY MOUTH TWICE A DAY  . albuterol (PROAIR HFA) 108 (90 Base) MCG/ACT inhaler Inhale 2 puffs into the lungs every 4 (four) hours as needed for wheezing or shortness of breath (cough). (Patient taking differently: Inhale 2 puffs into the lungs every 4 (four) hours as needed for wheezing or shortness of breath (or coughing).)  . atorvastatin (LIPITOR) 10 MG tablet Take 10 mg by mouth daily.  Marland Kitchen atorvastatin  (LIPITOR) 40 MG tablet Take 1 tablet (40 mg total) by mouth at bedtime.  . benzonatate (TESSALON) 200 MG capsule Take 1 capsule (200 mg total) by mouth 3 (three) times daily as needed for cough.  . Blood Glucose Monitoring Suppl (ACCU-CHEK AVIVA PLUS) w/Device KIT Please check you blood sugars 4 times a day  . dapagliflozin propanediol (FARXIGA) 5 MG TABS tablet Take 5 mg by mouth daily. (Patient taking differently: Take 5 mg by mouth 2 (two) times a day.)  . DERMA-SMOOTHE/FS SCALP 0.01 % OIL Apply topically.  . fluconazole (DIFLUCAN) 150 MG tablet TAKE 1 TABLET BY MOUTH NOW. THEN REPEAT IN 1 WEEK  . FLUoxetine (PROZAC) 20 MG capsule Take by mouth.  . furosemide (LASIX) 20 MG tablet Take 20 mg by mouth daily as needed for fluid or edema.   Marland Kitchen glucose blood (ACCU-CHEK SMARTVIEW) test strip TEST FOUR TIMES DAILY  . HUMULIN R U-500 KWIKPEN 500 UNIT/ML kwikpen Inject into the skin.  . hydrochlorothiazide (HYDRODIURIL) 12.5 MG tablet Take by mouth.  . insulin aspart (NOVOLOG) 100 UNIT/ML injection Inject 0-20 Units into the skin every 4 (four) hours. CBG 70 - 120: 0 units  CBG 121 - 150: 3 units  CBG 151 - 200: 4 units  CBG 201 - 250: 7 units  CBG 251 - 300:  11 units  CBG 301 - 350: 15 units  CBG 351 - 400: 20 units  CBG > 400 call MD (Patient taking differently: Inject 20 Units into the skin every 4 (four) hours. CBG 70 - 120: 0 units  CBG 121 - 150: 3 units  CBG 151 - 200: 4 units  CBG 201 - 250: 7 units  CBG 251 - 300: 11 units  CBG 301 - 350: 15 units  CBG 351 - 400: 20 units  CBG > 400 call MD)  . insulin aspart protamine - aspart (NOVOLOG MIX 70/30 FLEXPEN) (70-30) 100 UNIT/ML FlexPen Inject into the skin.  . Insulin Pen Needle (B-D UF III MINI PEN NEEDLES) 31G X 5 MM MISC USE AS DIRECTED TO INJECT INSULIN FIVE A DAY E11.65  . JANUVIA 100 MG tablet TAKE 1 TABLET BY MOUTH EVERY DAY  . lactulose (CEPHULAC) 10 g packet Take 1 packet (10 g total) by mouth daily as needed. Reported on  07/29/2015 (Patient taking differently: Take 10 g by mouth daily as needed (for constipation).)  . Lancets (ACCU-CHEK SOFT TOUCH) lancets Use as instructed  . liraglutide (VICTOZA) 18 MG/3ML SOPN Take 0.10m daily for 1 week, then 1.254mdaily for 1 week, then 1.52m47maily.  . lMarland Kitchenratadine (CLARITIN) 10 MG tablet Take 1 tablet (10 mg total) by mouth 2 (two) times daily.  . methocarbamol (ROBAXIN) 500 MG tablet Take 1 tablet (500 mg total) by mouth every 8 (eight) hours as needed. (Patient taking differently: Take 500 mg by mouth every 8 (eight) hours as needed for muscle spasms.)  . mometasone (NASONEX) 50 MCG/ACT nasal spray Place into the nose.  . mometasone-formoterol (DULERA) 200-5 MCG/ACT AERO Inhale 2 puffs into the lungs 2 (two) times daily.  . montelukast (SINGULAIR) 10 MG tablet Take 1 tablet (10 mg total) by mouth at bedtime.  . Naltrexone-buPROPion HCl ER (CONTRAVE) 8-90 MG TB12 Take by mouth.  . nitroGLYCERIN (NITRODUR - DOSED IN MG/24 HR) 0.2 mg/hr patch Apply 1/4th patch to affected achilles, change daily  . omeprazole (PRILOSEC) 20 MG capsule Take 1 capsule (20 mg total) by mouth daily before breakfast.  . SPIRIVA RESPIMAT 1.25 MCG/ACT AERS SMARTSIG:2 Puff(s) Via Inhaler Daily  . topiramate (TOPAMAX) 25 MG tablet Take by mouth.  . Insulin Glargine (LANTUS) 100 UNIT/ML Solostar Pen Inject 80 Units into the skin 2 (two) times daily.   Current Facility-Administered Medications (Other)  Medication Route  . diphenhydrAMINE (BENADRYL) injection 12.5 mg Intramuscular      REVIEW OF SYSTEMS: ROS    Positive for: Gastrointestinal, Musculoskeletal, Endocrine, Eyes, Respiratory   Negative for: Constitutional, Neurological, Skin, Genitourinary, HENT, Cardiovascular, Psychiatric, Allergic/Imm, Heme/Lymph   Last edited by BaxMatthew FolksOA on 05/23/2020  3:21 PM. (History)       ALLERGIES Allergies  Allergen Reactions  . Bee Venom Anaphylaxis and Swelling  . Wasp Venom Anaphylaxis and  Swelling  . Citrus Hives and Itching  . Latex Hives and Swelling  . Penicillins Hives and Itching    Has patient had a PCN reaction causing immediate rash, facial/tongue/throat swelling, SOB or lightheadedness with hypotension: Yes Has patient had a PCN reaction causing severe rash involving mucus membranes or skin necrosis: Unk Has patient had a PCN reaction that required hospitalization: Was already in hosp Has patient had a PCN reaction occurring within the last 10 years: No If all of the above answers are "NO", then may proceed with Cephalosporin use.   . Shellfish Allergy Other (See Comments)  and Swelling    Patient is uncertain; stated that she tolerates this (??)  . Soap Hives, Itching and Swelling    PUREX LAUNDRY DETERGENT  . Tomato Hives and Itching  . Banana Itching and Nausea Only  . Chocolate Itching  . Fluticasone Other (See Comments)    Per patient, makes sneeze and gag  . Hydrocodone Itching and Nausea Only  . Other Itching    Acidic foods- Itching [place]- Itching in the roof of the mouth  . Adhesive [Tape] Itching and Rash    Allergic to  "leads"  . Doxycycline Other (See Comments)    AFFECTS JOINTS  . Metformin And Related Nausea Only, Rash, Other (See Comments) and Diarrhea    GI UPSET, also  . Red Dye Itching, Rash and Other (See Comments)    At eye doctor's office, the dilating drops- Skin breaks out, chest turned red, itching, face became swollen    PAST MEDICAL HISTORY Past Medical History:  Diagnosis Date  . Acute nonintractable headache 12/26/2017  . Arthritis    major joints  . Asthma    prn inhaler  . Asthma exacerbation 07/25/2013  . Asthma in adult, severe persistent, with acute exacerbation 01/11/2018  . Carpal tunnel syndrome of right wrist 09/2012  . Cataract    NS OU  . Complication of anesthesia    states is hard to wake up post-op  . Constipation 12/30/2014  . Diabetes mellitus    intolerant to Metformin  . Diabetic retinopathy ([place])     NPDR OU  . Dry skin    hands   . Family history of anesthesia complication    mother went into coma during c-section  . Genital herpes 01/01/2006   on acyclovir BID   . GERD (gastroesophageal reflux disease)   . Herpes genital   . Hyperglycemia 12/26/2017  . Hypertensive retinopathy    OU  . Low back pain 11/22/2012  . Lower extremity edema 10/18/2012  . Menorrhagia 11/06/2015  . Morbid obesity with BMI of 50 to 60 07/23/2009  . Obesity hypoventilation syndrome ([place]) 10/30/2012  . Obesity, morbid ([place]) 01/10/2014  . OSA on CPAP     AHI was on  11-17-12 to 120/hr, titrated to 15 cm with 3 cm EPR/   . PCOS (polycystic ovarian syndrome)   . Rheumatoid arthritis ([place])   . Right ankle pain 06/19/2015  . Right hip pain 09/04/2017  . Right shoulder pain 06/01/2017  . Seasonal allergies   . Sleep apnea, obstructive    uses CPAP nightly - had sleep study 08/22/2007   Past Surgical History:  Procedure Laterality Date  . CARPAL TUNNEL RELEASE Right 10/02/2012   Procedure: RIGHT CARPAL TUNNEL RELEASE ENDOSCOPIC;  Surgeon: Jolyn Nap, MD;  Location: [facility];  Service: Orthopedics;  Laterality: Right;  . CHOLECYSTECTOMY N/A 12/02/2014   Procedure: LAPAROSCOPIC CHOLECYSTECTOMY;  Surgeon: Armandina Gemma, MD;  Location: [facility];  Service: General;  Laterality: N/A;  . HYSTEROSCOPY WITH D & C  01-08-2002    FAMILY HISTORY Family History  Problem Relation Age of Onset  . Other Mother   . Leukemia Cousin   . Other Other   . Obesity Other   . Other Other   . Diabetes Maternal Aunt   . Heart attack Maternal Aunt   . Alzheimer's disease Maternal Uncle   . Cancer Maternal Uncle        prostate  . Diabetes Paternal Aunt   . Cancer Paternal Aunt  brain,mouth  . Alzheimer's disease Paternal Aunt   . Diabetes Paternal Uncle   . Diabetes Maternal Grandmother   . Diabetes Maternal Grandfather     SOCIAL HISTORY Social History   Tobacco Use  . Smoking status: Former  Smoker    Years: 10.00    Quit date: 05/24/2010    Years since quitting: 10.0  . Smokeless tobacco: Never Used  Vaping Use  . Vaping Use: Never used  Substance Use Topics  . Alcohol use: Yes    Alcohol/week: 0.0 standard drinks    Comment: socially  . Drug use: No         OPHTHALMIC EXAM:  Base Eye Exam    Visual Acuity (Snellen - Linear)      Right Left   Dist [place] 20/40 20/50   Dist ph [place] 20/20 -2 20/20 -2   Correction: Glasses       Tonometry (Tonopen, 3:24 PM)      Right Left   Pressure 13 15       Pupils      Dark Light Shape React APD   Right 3 2 Round Brisk None   Left 3 2 Round Brisk None       Visual Fields (Counting fingers)      Left Right    Full Full       Extraocular Movement      Right Left    Full, Ortho Full, Ortho       Neuro/Psych    Oriented x3: Yes   Mood/Affect: Normal       Dilation    Both eyes: 1.0% Mydriacyl, 2.5% Phenylephrine @ 3:24 PM        Slit Lamp and Fundus Exam    Slit Lamp Exam      Right Left   Lids/Lashes Dermatochalasis - upper lid, mild Meibomian gland dysfunction Dermatochalasis - upper lid, mild Meibomian gland dysfunction   Conjunctiva/Sclera White and quiet White and quiet   Cornea 1-2+Punctate epithelial erosions 2-3+fine Punctate epithelial erosions   Anterior Chamber deep and quiet deep and quiet   Iris round and dilated, No NVI round and dilated   Lens 1+ NS, 1+CC 1+NS, 1+CC   Vitreous mild vitreous syneresis mild vitreous syneresis       Fundus Exam      Right Left   Disc Pink and sharp Pink and sharp   C/D Ratio 0.7 0.6   Macula good foveal reflex, focal exudates inferior to fovea and scattered superior macula, scattered MA, focal cystic changes IN macula good foveal reflex, scatttered MA's and exudate, central cystic changes -- persistent, focal cluster of exudates temporal macula   Vessels attenuated, Tortuous Vascular attenuation, Tortuous, Copper wiring   Periphery Attached, scattered  MA/exudates greatest nasally Attached, scattered MA and exudate greatest superior to disc, white without pressure temporally          IMAGING AND PROCEDURES  Imaging and Procedures for @TODAY @  OCT, Retina - OU - Both Eyes       Right Eye Quality was good. Central Foveal Thickness: 222. Progression has been stable. Findings include normal foveal contour, no SRF, intraretinal fluid, intraretinal hyper-reflective material (Persistent cystic changes IN macula).   Left Eye Quality was good. Central Foveal Thickness: 249. Progression has been stable. Findings include intraretinal fluid, no SRF, abnormal foveal contour, intraretinal hyper-reflective material (Persistent central cystic changes ).   Notes *Images captured and stored on drive  Diagnosis / Impression:  +DME OU-- persistent  IRF/IRHM OD: Persistent cystic changes IN macula OS: Persistent central cystic changes   Clinical management:  See below  Abbreviations: NFP - Normal foveal profile. CME - cystoid macular edema. PED - pigment epithelial detachment. IRF - intraretinal fluid. SRF - subretinal fluid. EZ - ellipsoid zone. ERM - epiretinal membrane. ORA - outer retinal atrophy. ORT - outer retinal tubulation. SRHM - subretinal hyper-reflective material        Focal Laser - OD - Right Eye       LASER PROCEDURE NOTE  Diagnosis:   Diabetic macular edema, right eye  Procedure:  Focal laser photocoagulation using slit lamp laser, right eye  Anesthesia:  Topical  Surgeon: Bernarda Caffey, MD, PhD   Informed consent obtained, operative eye marked, and time out performed prior to initiation of laser.   [place] [id number] Focal/Grid laser Power: 80 mW Duration: 50 msec  Spot size: 100 microns  # spots: 64 spots placed to parafoveal MA and exudates -- greatest inf nasal to fovea  Complications: None.  RTC: 2-3 wks  Patient tolerated the procedure well and received written and verbal post-procedure care  information/education.                   ASSESSMENT/PLAN:    ICD-10-CM   1. Moderate nonproliferative diabetic retinopathy of both eyes with macular edema associated with type 2 diabetes mellitus (HCC)  S93.7342 Focal Laser - OD - Right Eye  2. Retinal edema  H35.81 OCT, Retina - OU - Both Eyes  3. Essential hypertension  I10   4. Hypertensive retinopathy of both eyes  H35.033   5. Nuclear sclerosis of both eyes  H25.13     1,2. Moderate non-proliferative diabetic retinopathy w/ DME, OU  - delayed f/u from 4 wks to 3.5 mos (07.02.21 to 10.15.21) -- discussed importance of regular follow up and treatments  - pt lost to f/u from 10.23.2019 to 11.18.20  - FA in 2019 showed leaking MA OU; no NV OU  **of note, pt had pruritic rxn to fluorescein dye -- resolved with 12.5 mg IV benadryl**  - FA 12.16.2020 shows late leaking MA's, non-perfusion defects; no NV OU  - s/p IVA OD #1 (12.16.20), #2 (01.25.21), #3 (05.26.21), #4 (07.02.21), #5 (10.15.21), #6 (11.23.21)  - s/p focal laser OD (03.19.21)  - s/p IVA OS #1 OS (12.23.20), #2 (01.27.21), #3 (03.02.21), #4 (05.26.21), #5 (07.02.21), #6 (10.15.21), #7 (11.23.21), #8 (01.11.22), #9 (03.04.22)  - exam shows scattered MA, IRH, exudates and mild macular edema OU  - OCT shows OD: Persistent cystic changes IN macula; OS: Persistent central cystic changes   - BCVA OD: 20/20; OS: 20/20 (improved)             - Recommend focal laser OD today, 04.01.22             - Risks and benefits of tx discussed w/pt             - Pt wishes to proceed w/tx today             - informed consent obtained  - [place] informed consent form signed and scanned on 01.27.2021  - start PF QID OD x7 days             - see procedure note  - f/u 3 weeks, DFE, OCT, possible injections  3,4. Hypertensive retinopathy OU  - discussed importance of tight BP control  - monitor  5. Nuclear Sclerosis   - The symptoms of  cataract, surgical options, and treatments and  risks were discussed with patient.  - discussed diagnosis and progression  - under the expert management of [facility] Ordered this visit:  Meds ordered this encounter  Medications  . prednisoLONE acetate (PRED FORTE) 1 % ophthalmic suspension    Sig: Place 1 drop into the right eye 4 (four) times daily for 7 days.    Dispense:  10 mL    Refill:  0      Return in about 3 weeks (around 06/13/2020) for f/u NPDR OU, DFE, OCT.  There are no Patient Instructions on file for this visit.  Explained the diagnoses, plan, and follow up with the patient and they expressed understanding.  Patient expressed understanding of the importance of proper follow up care.  This document serves as a record of services personally performed by Gardiner Sleeper, MD, PhD. It was created on their behalf by San Jetty. Owens Shark, OA an ophthalmic technician. The creation of this record is the provider's dictation and/or activities during the visit.    Electronically signed by: San Jetty. [place], [place] 03.31.2022 4:28 PM  Gardiner Sleeper, M.D., Ph.D. Diseases & Surgery of the Retina and Vitreous [facility]  I have reviewed the above documentation for accuracy and completeness, and I agree with the above. Gardiner Sleeper, M.D., Ph.D. 05/23/20 4:28 PM  Abbreviations: M myopia (nearsighted); A astigmatism; H hyperopia (farsighted); P presbyopia; Mrx spectacle prescription;  CTL contact lenses; OD right eye; OS left eye; OU both eyes  XT exotropia; ET esotropia; PEK punctate epithelial keratitis; PEE punctate epithelial erosions; DES dry eye syndrome; MGD meibomian gland dysfunction; ATs artificial tears; PFAT's preservative free artificial tears; [place] nuclear sclerotic cataract; PSC posterior subcapsular cataract; ERM epi-retinal membrane; PVD posterior vitreous detachment; RD retinal detachment; DM diabetes mellitus; DR diabetic retinopathy; NPDR non-proliferative diabetic retinopathy;  PDR proliferative diabetic retinopathy; CSME clinically significant macular edema; DME diabetic macular edema; dbh dot blot hemorrhages; CWS cotton wool spot; POAG primary open angle glaucoma; C/D cup-to-disc ratio; HVF humphrey visual field; GVF goldmann visual field; OCT optical coherence tomography; IOP intraocular pressure; BRVO Branch retinal vein occlusion; CRVO central retinal vein occlusion; CRAO central retinal artery occlusion; BRAO branch retinal artery occlusion; RT retinal tear; SB scleral buckle; PPV pars plana vitrectomy; VH Vitreous hemorrhage; PRP panretinal laser photocoagulation; IVK intravitreal kenalog; VMT vitreomacular traction; MH Macular hole;  NVD neovascularization of the disc; NVE neovascularization elsewhere; AREDS age related eye disease study; ARMD age related macular degeneration; POAG primary open angle glaucoma; EBMD epithelial/anterior basement membrane dystrophy; ACIOL anterior chamber intraocular lens; IOL intraocular lens; PCIOL posterior chamber intraocular lens; Phaco/IOL phacoemulsification with intraocular lens placement; [place] photorefractive keratectomy; LASIK laser assisted in situ keratomileusis; HTN hypertension; DM diabetes mellitus; COPD chronic obstructive pulmonary disease

## 2020-05-23 ENCOUNTER — Ambulatory Visit (INDEPENDENT_AMBULATORY_CARE_PROVIDER_SITE_OTHER): Payer: Medicaid Other | Admitting: Ophthalmology

## 2020-05-23 ENCOUNTER — Encounter (INDEPENDENT_AMBULATORY_CARE_PROVIDER_SITE_OTHER): Payer: Self-pay | Admitting: Ophthalmology

## 2020-05-23 ENCOUNTER — Other Ambulatory Visit: Payer: Self-pay

## 2020-05-23 DIAGNOSIS — H3581 Retinal edema: Secondary | ICD-10-CM

## 2020-05-23 DIAGNOSIS — E113313 Type 2 diabetes mellitus with moderate nonproliferative diabetic retinopathy with macular edema, bilateral: Secondary | ICD-10-CM

## 2020-05-23 DIAGNOSIS — I10 Essential (primary) hypertension: Secondary | ICD-10-CM

## 2020-05-23 DIAGNOSIS — H35033 Hypertensive retinopathy, bilateral: Secondary | ICD-10-CM | POA: Diagnosis not present

## 2020-05-23 DIAGNOSIS — H2513 Age-related nuclear cataract, bilateral: Secondary | ICD-10-CM

## 2020-05-23 MED ORDER — PREDNISOLONE ACETATE 1 % OP SUSP: 1.0000 [drp] | Freq: Four times a day (QID) | OPHTHALMIC | 0 refills | Status: AC

## 2020-06-17 NOTE — Progress Notes (Signed)
[facility] Note  06/18/2020     CHIEF COMPLAINT Patient presents for Retina Follow Up   HISTORY OF PRESENT ILLNESS: Ana Long is a 45 y.o. female who presents to the clinic today for:   HPI    Retina Follow Up    Patient presents with  Diabetic Retinopathy.  In both eyes.  This started 3.  Since onset it is stable.  I, the attending physician,  performed the HPI with the patient and updated documentation appropriately.          Comments    Pt here for retinal follow up 3 weeks NPDR. Pt states vision is about the same, stable. No ocular pain or discomfort.        Last edited by Bernarda Caffey, MD on 06/19/2020  8:32 PM. (History)    pt states vision is stable, she saw Dr. Zenia Resides about a week ago and got a new rx for bifocals, pt states her eyes are dry and watering a lot and she is using hot compresses for a blocked tear duct  Referring physician: [name] MD [place], [place] [address]  HISTORICAL INFORMATION:   Selected notes from the [facility] Re-referred by Dr. [name] for DM exam   CURRENT MEDICATIONS: Current Outpatient Medications (Ophthalmic Drugs)  Medication Sig  . Olopatadine HCl 0.2 % SOLN Place one drop into both eyes daily.   No current facility-administered medications for this visit. (Ophthalmic Drugs)   Current Outpatient Medications (Other)  Medication Sig  . acyclovir (ZOVIRAX) 400 MG tablet TAKE 1 TABLET BY MOUTH TWICE A DAY  . albuterol (PROAIR HFA) 108 (90 Base) MCG/ACT inhaler Inhale 2 puffs into the lungs every 4 (four) hours as needed for wheezing or shortness of breath (cough). (Patient taking differently: Inhale 2 puffs into the lungs every 4 (four) hours as needed for wheezing or shortness of breath (or coughing).)  . atorvastatin (LIPITOR) 10 MG tablet Take 10 mg by mouth daily.  Marland Kitchen atorvastatin (LIPITOR) 40 MG tablet Take 1 tablet (40 mg total) by mouth at bedtime.  .  benzonatate (TESSALON) 200 MG capsule Take 1 capsule (200 mg total) by mouth 3 (three) times daily as needed for cough.  . Blood Glucose Monitoring Suppl (ACCU-CHEK AVIVA PLUS) w/Device KIT Please check you blood sugars 4 times a day  . dapagliflozin propanediol (FARXIGA) 5 MG TABS tablet Take 5 mg by mouth daily. (Patient taking differently: Take 5 mg by mouth 2 (two) times a day.)  . DERMA-SMOOTHE/FS SCALP 0.01 % OIL Apply topically.  . fluconazole (DIFLUCAN) 150 MG tablet TAKE 1 TABLET BY MOUTH NOW. THEN REPEAT IN 1 WEEK  . FLUoxetine (PROZAC) 20 MG capsule Take by mouth.  . furosemide (LASIX) 20 MG tablet Take 20 mg by mouth daily as needed for fluid or edema.   Marland Kitchen glucose blood (ACCU-CHEK SMARTVIEW) test strip TEST FOUR TIMES DAILY  . HUMULIN R U-500 KWIKPEN 500 UNIT/ML kwikpen Inject into the skin.  . hydrochlorothiazide (HYDRODIURIL) 12.5 MG tablet Take by mouth.  . insulin aspart (NOVOLOG) 100 UNIT/ML injection Inject 0-20 Units into the skin every 4 (four) hours. CBG 70 - 120: 0 units  CBG 121 - 150: 3 units  CBG 151 - 200: 4 units  CBG 201 - 250: 7 units  CBG 251 - 300: 11 units  CBG 301 - 350: 15 units  CBG 351 - 400: 20 units  CBG >  400 call MD (Patient taking differently: Inject 20 Units into the skin every 4 (four) hours. CBG 70 - 120: 0 units  CBG 121 - 150: 3 units  CBG 151 - 200: 4 units  CBG 201 - 250: 7 units  CBG 251 - 300: 11 units  CBG 301 - 350: 15 units  CBG 351 - 400: 20 units  CBG > 400 call MD)  . insulin aspart protamine - aspart (NOVOLOG MIX 70/30 FLEXPEN) (70-30) 100 UNIT/ML FlexPen Inject into the skin.  . Insulin Glargine (LANTUS) 100 UNIT/ML Solostar Pen Inject 80 Units into the skin 2 (two) times daily.  . Insulin Pen Needle (B-D UF III MINI PEN NEEDLES) 31G X 5 MM MISC USE AS DIRECTED TO INJECT INSULIN FIVE A DAY E11.65  . JANUVIA 100 MG tablet TAKE 1 TABLET BY MOUTH EVERY DAY  . lactulose (CEPHULAC) 10 g packet Take 1 packet (10 g total) by mouth daily  as needed. Reported on 07/29/2015 (Patient taking differently: Take 10 g by mouth daily as needed (for constipation).)  . Lancets (ACCU-CHEK SOFT TOUCH) lancets Use as instructed  . liraglutide (VICTOZA) 18 MG/3ML SOPN Take 0.43m daily for 1 week, then 1.213mdaily for 1 week, then 1.54m24maily.  . lMarland Kitchenratadine (CLARITIN) 10 MG tablet Take 1 tablet (10 mg total) by mouth 2 (two) times daily.  . methocarbamol (ROBAXIN) 500 MG tablet Take 1 tablet (500 mg total) by mouth every 8 (eight) hours as needed. (Patient taking differently: Take 500 mg by mouth every 8 (eight) hours as needed for muscle spasms.)  . mometasone (NASONEX) 50 MCG/ACT nasal spray Place into the nose.  . mometasone-formoterol (DULERA) 200-5 MCG/ACT AERO Inhale 2 puffs into the lungs 2 (two) times daily.  . montelukast (SINGULAIR) 10 MG tablet Take 1 tablet (10 mg total) by mouth at bedtime.  . Naltrexone-buPROPion HCl ER (CONTRAVE) 8-90 MG TB12 Take by mouth.  . nitroGLYCERIN (NITRODUR - DOSED IN MG/24 HR) 0.2 mg/hr patch Apply 1/4th patch to affected achilles, change daily  . omeprazole (PRILOSEC) 20 MG capsule Take 1 capsule (20 mg total) by mouth daily before breakfast.  . SPIRIVA RESPIMAT 1.25 MCG/ACT AERS SMARTSIG:2 Puff(s) Via Inhaler Daily  . topiramate (TOPAMAX) 25 MG tablet Take by mouth.   Current Facility-Administered Medications (Other)  Medication Route  . diphenhydrAMINE (BENADRYL) injection 12.5 mg Intramuscular      REVIEW OF SYSTEMS: ROS    Positive for: Gastrointestinal, Musculoskeletal, Endocrine, Eyes, Respiratory   Negative for: Constitutional, Neurological, Skin, Genitourinary, HENT, Cardiovascular, Psychiatric, Allergic/Imm, Heme/Lymph   Last edited by SimKingsley SpittleOT on 06/18/2020  2:54 PM. (History)       ALLERGIES Allergies  Allergen Reactions  . Bee Venom Anaphylaxis and Swelling  . Wasp Venom Anaphylaxis and Swelling  . Citrus Hives and Itching  . Latex Hives and Swelling  .  Penicillins Hives and Itching    Has patient had a PCN reaction causing immediate rash, facial/tongue/throat swelling, SOB or lightheadedness with hypotension: Yes Has patient had a PCN reaction causing severe rash involving mucus membranes or skin necrosis: Unk Has patient had a PCN reaction that required hospitalization: Was already in hosp Has patient had a PCN reaction occurring within the last 10 years: No If all of the above answers are "NO", then may proceed with Cephalosporin use.   . Shellfish Allergy Other (See Comments) and Swelling    Patient is uncertain; stated that she tolerates this (??)  . Soap Hives, Itching  and Swelling    PUREX LAUNDRY DETERGENT  . Tomato Hives and Itching  . Banana Itching and Nausea Only  . Chocolate Itching  . Fluticasone Other (See Comments)    Per patient, makes sneeze and gag  . Hydrocodone Itching and Nausea Only  . Other Itching    Acidic foods- Itching [place]- Itching in the roof of the mouth  . Adhesive [Tape] Itching and Rash    Allergic to  "leads"  . Doxycycline Other (See Comments)    AFFECTS JOINTS  . Metformin And Related Nausea Only, Rash, Other (See Comments) and Diarrhea    GI UPSET, also  . Red Dye Itching, Rash and Other (See Comments)    At eye doctor's office, the dilating drops- Skin breaks out, chest turned red, itching, face became swollen    PAST MEDICAL HISTORY Past Medical History:  Diagnosis Date  . Acute nonintractable headache 12/26/2017  . Arthritis    major joints  . Asthma    prn inhaler  . Asthma exacerbation 07/25/2013  . Asthma in adult, severe persistent, with acute exacerbation 01/11/2018  . Carpal tunnel syndrome of right wrist 09/2012  . Cataract    NS OU  . Complication of anesthesia    states is hard to wake up post-op  . Constipation 12/30/2014  . Diabetes mellitus    intolerant to Metformin  . Diabetic retinopathy ([place])    NPDR OU  . Dry skin    hands   . Family history of anesthesia  complication    mother went into coma during c-section  . Genital herpes 01/01/2006   on acyclovir BID   . GERD (gastroesophageal reflux disease)   . Herpes genital   . Hyperglycemia 12/26/2017  . Hypertensive retinopathy    OU  . Low back pain 11/22/2012  . Lower extremity edema 10/18/2012  . Menorrhagia 11/06/2015  . Morbid obesity with BMI of 50 to 60 07/23/2009  . Obesity hypoventilation syndrome ([place]) 10/30/2012  . Obesity, morbid ([place]) 01/10/2014  . OSA on CPAP     AHI was on  11-17-12 to 120/hr, titrated to 15 cm with 3 cm EPR/   . PCOS (polycystic ovarian syndrome)   . Rheumatoid arthritis ([place])   . Right ankle pain 06/19/2015  . Right hip pain 09/04/2017  . Right shoulder pain 06/01/2017  . Seasonal allergies   . Sleep apnea, obstructive    uses CPAP nightly - had sleep study 08/22/2007   Past Surgical History:  Procedure Laterality Date  . CARPAL TUNNEL RELEASE Right 10/02/2012   Procedure: RIGHT CARPAL TUNNEL RELEASE ENDOSCOPIC;  Surgeon: Jolyn Nap, MD;  Location: [facility];  Service: Orthopedics;  Laterality: Right;  . CHOLECYSTECTOMY N/A 12/02/2014   Procedure: LAPAROSCOPIC CHOLECYSTECTOMY;  Surgeon: Armandina Gemma, MD;  Location: [facility];  Service: General;  Laterality: N/A;  . HYSTEROSCOPY WITH D & C  01-08-2002    FAMILY HISTORY Family History  Problem Relation Age of Onset  . Other Mother   . Leukemia Cousin   . Other Other   . Obesity Other   . Other Other   . Diabetes Maternal Aunt   . Heart attack Maternal Aunt   . Alzheimer's disease Maternal Uncle   . Cancer Maternal Uncle        prostate  . Diabetes Paternal Aunt   . Cancer Paternal Aunt        brain,mouth  . Alzheimer's disease Paternal Aunt   . Diabetes Paternal  Uncle   . Diabetes Maternal Grandmother   . Diabetes Maternal Grandfather     SOCIAL HISTORY Social History   Tobacco Use  . Smoking status: Former Smoker    Years: 10.00    Quit date: 05/24/2010    Years since  quitting: 10.0  . Smokeless tobacco: Never Used  Vaping Use  . Vaping Use: Never used  Substance Use Topics  . Alcohol use: Yes    Alcohol/week: 0.0 standard drinks    Comment: socially  . Drug use: No         OPHTHALMIC EXAM:  Base Eye Exam    Visual Acuity (Snellen - Linear)      Right Left   Dist cc 20/30 +2 20/30 +1   Dist ph cc 20/20 -1 20/20   Correction: Glasses       Pachymetry (06/18/2020)      Right Left   Thickness 17 22       Pupils      Dark Light Shape React APD   Right 3 2 Round Brisk None   Left 3 2 Round Brisk None       Visual Fields (Counting fingers)      Left Right    Full Full       Extraocular Movement      Right Left    Full, Ortho Full, Ortho       Neuro/Psych    Oriented x3: Yes   Mood/Affect: Normal       Dilation    Both eyes: 1.0% Mydriacyl, 2.5% Phenylephrine @ 3:03 PM        Slit Lamp and Fundus Exam    Slit Lamp Exam      Right Left   Lids/Lashes Dermatochalasis - upper lid, mild Meibomian gland dysfunction Dermatochalasis - upper lid, mild Meibomian gland dysfunction   Conjunctiva/Sclera White and quiet White and quiet   Cornea 1-2+Punctate epithelial erosions 2-3+fine Punctate epithelial erosions   Anterior Chamber deep and quiet deep and quiet   Iris round and dilated, No NVI round and dilated   Lens 1+ NS, 1+CC 1+NS, 1+CC   Vitreous mild vitreous syneresis mild vitreous syneresis       Fundus Exam      Right Left   Disc Pink and sharp, +SVP Pink and sharp   C/D Ratio 0.7 0.6   Macula good foveal reflex, focal exudates inferior to fovea and scattered superior macula, scattered MA, focal cystic changes IN macula good foveal reflex, scatttered MA's and exudate, central cystic changes -- persistent, focal cluster of exudates temporal macula   Vessels attenuated, Tortuous Vascular attenuation, Tortuous, Copper wiring   Periphery Attached, scattered MA/exudates greatest nasally Attached, scattered MA and exudate  greatest superior to disc, white without pressure temporally        Refraction    Wearing Rx      Sphere Cylinder Axis   Right -2.50 +1.50 013   Left -2.25 Sphere    Type: SVL          IMAGING AND PROCEDURES  Imaging and Procedures for @TODAY @  OCT, Retina - OU - Both Eyes       Right Eye Quality was good. Central Foveal Thickness: 222. Progression has been stable. Findings include normal foveal contour, no SRF, intraretinal fluid, intraretinal hyper-reflective material, vitreomacular adhesion  (Persistent cystic changes IN macula).   Left Eye Quality was good. Central Foveal Thickness: 252. Progression has been stable. Findings include intraretinal fluid, no SRF,  abnormal foveal contour, intraretinal hyper-reflective material (Persistent central cystic changes ).   Notes *Images captured and stored on drive  Diagnosis / Impression:  +DME OU-- persistent IRF/IRHM OD: Persistent cystic changes IN macula OS: Persistent central cystic changes   Clinical management:  See below  Abbreviations: NFP - Normal foveal profile. CME - cystoid macular edema. PED - pigment epithelial detachment. IRF - intraretinal fluid. SRF - subretinal fluid. EZ - ellipsoid zone. ERM - epiretinal membrane. ORA - outer retinal atrophy. ORT - outer retinal tubulation. SRHM - subretinal hyper-reflective material        Intravitreal Injection, Pharmacologic Agent - OS - Left Eye       Time Out 06/18/2020. 3:56 PM. Confirmed correct patient, procedure, site, and patient consented.   Anesthesia Topical anesthesia was used. Anesthetic medications included Lidocaine 2%, Proparacaine 0.5%.   Procedure Preparation included 5% betadine to ocular surface, eyelid speculum. A supplied (32g) needle was used.   Injection:  1.25 mg Bevacizumab (AVASTIN) 1.25m/0.05mL SOLN   NDC: 508657-846-96 Lot: [id number] , Expiration date: 07/06/2020   Route: Intravitreal, Site: Left Eye, Waste: 0 mL  Post-op Post  injection exam found visual acuity of at least counting fingers. The patient tolerated the procedure well. There were no complications. The patient received written and verbal post procedure care education. Post injection medications were not given.                 ASSESSMENT/PLAN:    ICD-10-CM   1. Moderate nonproliferative diabetic retinopathy of both eyes with macular edema associated with type 2 diabetes mellitus (HCC)  E5/28/2022Intravitreal Injection, Pharmacologic Agent - OS - Left Eye    Bevacizumab (AVASTIN) SOLN 1.25 mg  2. Retinal edema  H35.81 OCT, Retina - OU - Both Eyes  3. Essential hypertension  I10   4. Hypertensive retinopathy of both eyes  H35.033   5. Nuclear sclerosis of both eyes  H25.13     1,2. Moderate non-proliferative diabetic retinopathy w/ DME, OU  - delayed f/u from 4 wks to 3.5 mos (07.02.21 to 10.15.21) -- discussed importance of regular follow up and treatments  - pt lost to f/u from 10.23.2019 to 11.18.20  - FA in 2019 showed leaking MA OU; no NV OU  **of note, pt had pruritic rxn to fluorescein dye -- resolved with 12.5 mg IV benadryl**  - FA 12.16.2020 shows late leaking MA's, non-perfusion defects; no NV OU  - s/p IVA OD #1 (12.16.20), #2 (01.25.21), #3 (05.26.21), #4 (07.02.21), #5 (10.15.21), #6 (11.23.21)  - s/p focal laser OD (03.19.21), (04.01.22)  - s/p IVA OS #1 OS (12.23.20), #2 (01.27.21), #3 (03.02.21), #4 (05.26.21), #5 (07.02.21), #6 (10.15.21), #7 (11.23.21), #8 (01.11.22), #9 (03.04.22)  - exam shows scattered MA, IRH, exudates and mild macular edema OU  - OCT shows OD: Persistent cystic changes IN macula; OS: Persistent central cystic changes   - BCVA OD: 20/20; OS: 20/20 -- both stable             - Recommend IVA OS #10 today, 04.27.22            - Pt wishes to proceed with injections             - informed consent obtained  - Avastin informed consent form signed and scanned on 01.27.2021             - see procedure note  - f/u  8 weeks, DFE, OCT, possible injections  3,4. Hypertensive retinopathy OU  -  discussed importance of tight BP control  - monitor  5. Nuclear Sclerosis   - The symptoms of cataract, surgical options, and treatments and risks were discussed with patient.  - discussed diagnosis and progression  - under the expert management of [place] Ordered this visit:  Meds ordered this encounter  Medications  . Bevacizumab (AVASTIN) SOLN 1.25 mg      Return in about 8 weeks (around 08/13/2020) for f/u NPDR OU, DFE, OCT.  There are no Patient Instructions on file for this visit.  Explained the diagnoses, plan, and follow up with the patient and they expressed understanding.  Patient expressed understanding of the importance of proper follow up care.  This document serves as a record of services personally performed by Gardiner Sleeper, MD, PhD. It was created on their behalf by Roselee Nova, COMT. The creation of this record is the provider's dictation and/or activities during the visit.  Electronically signed by: Roselee Nova, COMT 06/19/20 8:35 PM  This document serves as a record of services personally performed by Gardiner Sleeper, MD, PhD. It was created on their behalf by San Jetty. Owens Shark, OA an ophthalmic technician. The creation of this record is the provider's dictation and/or activities during the visit.    Electronically signed by: San Jetty. Owens Shark, [place] 04.27.2022 8:35 PM  Gardiner Sleeper, M.D., Ph.D. Diseases & Surgery of the Retina and Vitreous [facility]  I have reviewed the above documentation for accuracy and completeness, and I agree with the above. Gardiner Sleeper, M.D., Ph.D. 06/19/20 8:35 PM   Abbreviations: M myopia (nearsighted); A astigmatism; H hyperopia (farsighted); P presbyopia; Mrx spectacle prescription;  CTL contact lenses; OD right eye; OS left eye; OU both eyes  XT exotropia; ET esotropia; PEK punctate epithelial  keratitis; PEE punctate epithelial erosions; DES dry eye syndrome; MGD meibomian gland dysfunction; ATs artificial tears; PFAT's preservative free artificial tears; [place] nuclear sclerotic cataract; PSC posterior subcapsular cataract; ERM epi-retinal membrane; PVD posterior vitreous detachment; RD retinal detachment; DM diabetes mellitus; DR diabetic retinopathy; NPDR non-proliferative diabetic retinopathy; PDR proliferative diabetic retinopathy; CSME clinically significant macular edema; DME diabetic macular edema; dbh dot blot hemorrhages; CWS cotton wool spot; POAG primary open angle glaucoma; C/D cup-to-disc ratio; HVF humphrey visual field; GVF goldmann visual field; OCT optical coherence tomography; IOP intraocular pressure; BRVO Branch retinal vein occlusion; CRVO central retinal vein occlusion; CRAO central retinal artery occlusion; BRAO branch retinal artery occlusion; RT retinal tear; SB scleral buckle; PPV pars plana vitrectomy; VH Vitreous hemorrhage; PRP panretinal laser photocoagulation; IVK intravitreal kenalog; VMT vitreomacular traction; MH Macular hole;  NVD neovascularization of the disc; NVE neovascularization elsewhere; AREDS age related eye disease study; ARMD age related macular degeneration; POAG primary open angle glaucoma; EBMD epithelial/anterior basement membrane dystrophy; ACIOL anterior chamber intraocular lens; IOL intraocular lens; PCIOL posterior chamber intraocular lens; Phaco/IOL phacoemulsification with intraocular lens placement; [place] photorefractive keratectomy; LASIK laser assisted in situ keratomileusis; HTN hypertension; DM diabetes mellitus; COPD chronic obstructive pulmonary disease

## 2020-06-18 ENCOUNTER — Other Ambulatory Visit: Payer: Self-pay

## 2020-06-18 ENCOUNTER — Encounter (INDEPENDENT_AMBULATORY_CARE_PROVIDER_SITE_OTHER): Payer: Self-pay | Admitting: Ophthalmology

## 2020-06-18 ENCOUNTER — Ambulatory Visit (INDEPENDENT_AMBULATORY_CARE_PROVIDER_SITE_OTHER): Payer: Medicaid Other | Admitting: Ophthalmology

## 2020-06-18 DIAGNOSIS — H3581 Retinal edema: Secondary | ICD-10-CM | POA: Diagnosis not present

## 2020-06-18 DIAGNOSIS — H35033 Hypertensive retinopathy, bilateral: Secondary | ICD-10-CM

## 2020-06-18 DIAGNOSIS — I10 Essential (primary) hypertension: Secondary | ICD-10-CM | POA: Diagnosis not present

## 2020-06-18 DIAGNOSIS — H2513 Age-related nuclear cataract, bilateral: Secondary | ICD-10-CM

## 2020-06-18 DIAGNOSIS — E113313 Type 2 diabetes mellitus with moderate nonproliferative diabetic retinopathy with macular edema, bilateral: Secondary | ICD-10-CM | POA: Diagnosis not present

## 2020-06-19 ENCOUNTER — Encounter (INDEPENDENT_AMBULATORY_CARE_PROVIDER_SITE_OTHER): Payer: Self-pay | Admitting: Ophthalmology

## 2020-06-19 DIAGNOSIS — E113313 Type 2 diabetes mellitus with moderate nonproliferative diabetic retinopathy with macular edema, bilateral: Secondary | ICD-10-CM | POA: Diagnosis not present

## 2020-06-19 MED ORDER — BEVACIZUMAB CHEMO INJECTION 1.25MG/0.05ML SYRINGE FOR KALEIDOSCOPE
1.2500 mg | INTRAVITREAL | Status: AC | PRN
  Administered 2020-06-19: 1.25 mg via INTRAVITREAL

## 2020-08-19 ENCOUNTER — Ambulatory Visit (INDEPENDENT_AMBULATORY_CARE_PROVIDER_SITE_OTHER): Payer: Medicaid Other | Admitting: Ophthalmology

## 2020-08-19 ENCOUNTER — Other Ambulatory Visit: Payer: Self-pay

## 2020-08-19 ENCOUNTER — Encounter (INDEPENDENT_AMBULATORY_CARE_PROVIDER_SITE_OTHER): Payer: Self-pay | Admitting: Ophthalmology

## 2020-08-19 DIAGNOSIS — H3581 Retinal edema: Secondary | ICD-10-CM | POA: Diagnosis not present

## 2020-08-19 DIAGNOSIS — E113313 Type 2 diabetes mellitus with moderate nonproliferative diabetic retinopathy with macular edema, bilateral: Secondary | ICD-10-CM

## 2020-08-19 DIAGNOSIS — H2513 Age-related nuclear cataract, bilateral: Secondary | ICD-10-CM

## 2020-08-19 DIAGNOSIS — I10 Essential (primary) hypertension: Secondary | ICD-10-CM | POA: Diagnosis not present

## 2020-08-19 DIAGNOSIS — H35033 Hypertensive retinopathy, bilateral: Secondary | ICD-10-CM | POA: Diagnosis not present

## 2020-08-19 NOTE — Progress Notes (Signed)
[facility] Note  08/19/2020     CHIEF COMPLAINT Patient presents for Retina Follow Up   HISTORY OF PRESENT ILLNESS: Ana Long is a 45 y.o. female who presents to the clinic today for:   HPI     Retina Follow Up   Patient presents with  Diabetic Retinopathy.  In both eyes.  This started years ago.  Severity is moderate.  Duration of 8 weeks.  Since onset it is stable.  I, the attending physician,  performed the HPI with the patient and updated documentation appropriately.        Comments   45 y/o female pt here for 8 wk f/u for mod NPDR w/DME OU.  No change in [place] OU.  Denies pain, FOL, floaters.  Saline gtts prn OU.  BS 187 this a.m.  A1C 10.4.      Last edited by Bernarda Caffey, MD on 08/19/2020 11:29 PM.       Referring physician: [name] MD [place], [place] [address]  HISTORICAL INFORMATION:   Selected notes from the [facility] Re-referred by Dr. [name] for DM exam   CURRENT MEDICATIONS: Current Outpatient Medications (Ophthalmic Drugs)  Medication Sig   Olopatadine HCl 0.2 % SOLN Place one drop into both eyes daily.   No current facility-administered medications for this visit. (Ophthalmic Drugs)   Current Outpatient Medications (Other)  Medication Sig   acyclovir (ZOVIRAX) 400 MG tablet TAKE 1 TABLET BY MOUTH TWICE A DAY   albuterol (PROAIR HFA) 108 (90 Base) MCG/ACT inhaler Inhale 2 puffs into the lungs every 4 (four) hours as needed for wheezing or shortness of breath (cough). (Patient taking differently: Inhale 2 puffs into the lungs every 4 (four) hours as needed for wheezing or shortness of breath (or coughing).)   atorvastatin (LIPITOR) 10 MG tablet Take 10 mg by mouth daily.   atorvastatin (LIPITOR) 40 MG tablet Take 1 tablet (40 mg total) by mouth at bedtime.   benzonatate (TESSALON) 200 MG capsule Take 1 capsule (200 mg total) by mouth 3 (three) times daily as needed for cough.    Blood Glucose Monitoring Suppl (ACCU-CHEK AVIVA PLUS) w/Device KIT Please check you blood sugars 4 times a day   dapagliflozin propanediol (FARXIGA) 5 MG TABS tablet Take 5 mg by mouth daily. (Patient taking differently: Take 5 mg by mouth 2 (two) times a day.)   DERMA-SMOOTHE/FS SCALP 0.01 % OIL Apply topically.   fluconazole (DIFLUCAN) 150 MG tablet TAKE 1 TABLET BY MOUTH NOW. THEN REPEAT IN 1 WEEK   FLUoxetine (PROZAC) 20 MG capsule Take by mouth.   furosemide (LASIX) 20 MG tablet Take 20 mg by mouth daily as needed for fluid or edema.    glucose blood (ACCU-CHEK SMARTVIEW) test strip TEST FOUR TIMES DAILY   HUMULIN R U-500 KWIKPEN 500 UNIT/ML kwikpen Inject into the skin.   hydrochlorothiazide (HYDRODIURIL) 12.5 MG tablet Take by mouth.   insulin aspart (NOVOLOG) 100 UNIT/ML injection Inject 0-20 Units into the skin every 4 (four) hours. CBG 70 - 120: 0 units  CBG 121 - 150: 3 units  CBG 151 - 200: 4 units  CBG 201 - 250: 7 units  CBG 251 - 300: 11 units  CBG 301 - 350: 15 units  CBG 351 - 400: 20 units  CBG > 400 call MD (Patient taking differently: Inject 20 Units into the skin every 4 (four) hours. CBG 70 - 120:  0 units  CBG 121 - 150: 3 units  CBG 151 - 200: 4 units  CBG 201 - 250: 7 units  CBG 251 - 300: 11 units  CBG 301 - 350: 15 units  CBG 351 - 400: 20 units  CBG > 400 call MD)   insulin aspart protamine - aspart (NOVOLOG MIX 70/30 FLEXPEN) (70-30) 100 UNIT/ML FlexPen Inject into the skin.   Insulin Pen Needle (B-D UF III MINI PEN NEEDLES) 31G X 5 MM MISC USE AS DIRECTED TO INJECT INSULIN FIVE A DAY E11.65   JANUVIA 100 MG tablet TAKE 1 TABLET BY MOUTH EVERY DAY   lactulose (CEPHULAC) 10 g packet Take 1 packet (10 g total) by mouth daily as needed. Reported on 07/29/2015 (Patient taking differently: Take 10 g by mouth daily as needed (for constipation).)   Lancets (ACCU-CHEK SOFT TOUCH) lancets Use as instructed   liraglutide (VICTOZA) 18 MG/3ML SOPN Take 0.17m daily for 1  week, then 1.2217mdaily for 1 week, then 1.17m53maily.   loratadine (CLARITIN) 10 MG tablet Take 1 tablet (10 mg total) by mouth 2 (two) times daily.   methocarbamol (ROBAXIN) 500 MG tablet Take 1 tablet (500 mg total) by mouth every 8 (eight) hours as needed. (Patient taking differently: Take 500 mg by mouth every 8 (eight) hours as needed for muscle spasms.)   mometasone (NASONEX) 50 MCG/ACT nasal spray Place into the nose.   mometasone-formoterol (DULERA) 200-5 MCG/ACT AERO Inhale 2 puffs into the lungs 2 (two) times daily.   montelukast (SINGULAIR) 10 MG tablet Take 1 tablet (10 mg total) by mouth at bedtime.   Naltrexone-buPROPion HCl ER (CONTRAVE) 8-90 MG TB12 Take by mouth.   nitroGLYCERIN (NITRODUR - DOSED IN MG/24 HR) 0.2 mg/hr patch Apply 1/4th patch to affected achilles, change daily   omeprazole (PRILOSEC) 20 MG capsule Take 1 capsule (20 mg total) by mouth daily before breakfast.   SPIRIVA RESPIMAT 1.25 MCG/ACT AERS SMARTSIG:2 Puff(s) Via Inhaler Daily   topiramate (TOPAMAX) 25 MG tablet Take by mouth.   Insulin Glargine (LANTUS) 100 UNIT/ML Solostar Pen Inject 80 Units into the skin 2 (two) times daily.   Current Facility-Administered Medications (Other)  Medication Route   diphenhydrAMINE (BENADRYL) injection 12.5 mg Intramuscular      REVIEW OF SYSTEMS: ROS   Positive for: Gastrointestinal, Endocrine, Eyes, Respiratory Negative for: Constitutional, Neurological, Skin, Genitourinary, Musculoskeletal, HENT, Cardiovascular, Psychiatric, Allergic/Imm, Heme/Lymph Last edited by BaxMatthew FolksOA on 08/19/2020  3:04 PM.        ALLERGIES Allergies  Allergen Reactions   Bee Venom Anaphylaxis and Swelling   Wasp Venom Anaphylaxis and Swelling   Citrus Hives and Itching   Latex Hives and Swelling   Penicillins Hives and Itching    Has patient had a PCN reaction causing immediate rash, facial/tongue/throat swelling, SOB or lightheadedness with hypotension: Yes Has patient  had a PCN reaction causing severe rash involving mucus membranes or skin necrosis: Unk Has patient had a PCN reaction that required hospitalization: Was already in hosp Has patient had a PCN reaction occurring within the last 10 years: No If all of the above answers are "NO", then may proceed with Cephalosporin use.    Shellfish Allergy Other (See Comments) and Swelling    Patient is uncertain; stated that she tolerates this (??)   Soap Hives, Itching and Swelling    PUREX LAUNDRY DETERGENT   Tomato Hives and Itching   Banana Itching and Nausea Only   Chocolate Itching  Fluticasone Other (See Comments)    Per patient, makes sneeze and gag   Hydrocodone Itching and Nausea Only   Other Itching    Acidic foods- Itching [place]- Itching in the roof of the mouth   Adhesive [Tape] Itching and Rash    Allergic to  "leads"   Doxycycline Other (See Comments)    AFFECTS JOINTS   Metformin And Related Nausea Only, Rash, Other (See Comments) and Diarrhea    GI UPSET, also   Red Dye Itching, Rash and Other (See Comments)    At eye doctor's office, the dilating drops- Skin breaks out, chest turned red, itching, face became swollen    PAST MEDICAL HISTORY 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   Cataract    NS OU   Complication of anesthesia    states is hard to wake up post-op   Constipation 12/30/2014   Diabetes mellitus    intolerant to Metformin   Diabetic retinopathy ([place])    NPDR OU   Dry skin    hands    Family history of anesthesia complication    mother went into coma during c-section   Genital herpes 01/01/2006   on acyclovir BID    GERD (gastroesophageal reflux disease)    Herpes genital    Hyperglycemia 12/26/2017    Hypertensive retinopathy    OU   Low back pain 11/22/2012   Lower extremity edema 10/18/2012   Menorrhagia 11/06/2015   Morbid obesity with BMI of 50 to 60 07/23/2009   Obesity hypoventilation syndrome ([place]) 10/30/2012   Obesity, morbid ([place]) 01/10/2014   OSA on CPAP     AHI was on  11-17-12 to 120/hr, titrated to 15 cm with 3 cm EPR/    PCOS (polycystic ovarian syndrome)    Rheumatoid arthritis ([place])    Right ankle pain 06/19/2015   Right hip pain 09/04/2017   Right shoulder pain 06/01/2017   Seasonal allergies    Sleep apnea, obstructive    uses CPAP nightly - had sleep study 08/22/2007   Past Surgical History:  Procedure Laterality Date   CARPAL TUNNEL RELEASE Right 10/02/2012   Procedure: RIGHT CARPAL TUNNEL RELEASE ENDOSCOPIC;  Surgeon: Jolyn Nap, MD;  Location: [facility];  Service: Orthopedics;  Laterality: Right;   CHOLECYSTECTOMY N/A 12/02/2014   Procedure: LAPAROSCOPIC CHOLECYSTECTOMY;  Surgeon: Armandina Gemma, MD;  Location: [facility];  Service: General;  Laterality: N/A;   HYSTEROSCOPY WITH D & C  01-08-2002    FAMILY HISTORY Family History  Problem Relation Age of Onset   Other Mother    Leukemia Cousin    Other Other    Obesity Other    Other Other    Diabetes Maternal Aunt    Heart attack Maternal Aunt    Alzheimer's disease Maternal Uncle    Cancer Maternal Uncle        prostate   Diabetes Paternal Aunt    Cancer Paternal Aunt        brain[place]   Alzheimer's disease Paternal Aunt    Diabetes Paternal Uncle    Diabetes Maternal Grandmother    Diabetes Maternal Grandfather     SOCIAL HISTORY Social History   Tobacco Use  Smoking status: Former    Years: 10.00    Pack years: 0.00    Types: Cigarettes    Quit date: 05/24/2010    Years since quitting: 10.2   Smokeless tobacco: Never  Vaping Use   Vaping Use: Never used  Substance Use Topics   Alcohol use: Yes    Alcohol/week: 0.0 standard drinks    Comment: socially   Drug use: No          OPHTHALMIC EXAM:  Base Eye Exam     Visual Acuity (Snellen - Linear)       Right Left   Dist cc 20/30 -2 20/30 -2   Dist ph cc 20/20 -2 20/20 -2    Correction: Glasses         Tonometry (Tonopen, 3:25 PM)       Right Left   Pressure 15 17         Pachymetry (06/18/2020)       Right Left   Thickness 17 22         Pupils       Dark Light Shape React APD   Right 3 2 Round Brisk None   Left 3 2 Round Brisk None         Visual Fields (Counting fingers)       Left Right    Full Full         Extraocular Movement       Right Left    Full, Ortho Full, Ortho         Neuro/Psych     Oriented x3: Yes   Mood/Affect: Normal         Dilation     Both eyes: 1.0% Mydriacyl, 2.5% Phenylephrine @ 3:25 PM           Slit Lamp and Fundus Exam     Slit Lamp Exam       Right Left   Lids/Lashes Dermatochalasis - upper lid, mild Meibomian gland dysfunction Dermatochalasis - upper lid, mild Meibomian gland dysfunction   Conjunctiva/Sclera White and quiet White and quiet   Cornea 1-2+Punctate epithelial erosions 2-3+fine Punctate epithelial erosions   Anterior Chamber deep and quiet deep and quiet   Iris round and dilated, No NVI round and dilated   Lens 1+ NS, 1+CC 1+NS, 1+CC   Vitreous mild vitreous syneresis mild vitreous syneresis         Fundus Exam       Right Left   Disc Pink and sharp, +cupping Pink and sharp, +cupping   C/D Ratio 0.7 0.6   Macula good foveal reflex, focal exudates inferior to fovea and scattered superior macula, scattered MA, focal cystic changes IN macula, focal laser changes infeiror macula good foveal reflex, scatttered MA's and exudate, central cystic changes -- persistent (mild), focal cluster of exudates temporal macula   Vessels attenuated, Tortuous Vascular attenuation, Tortuous, Copper wiring   Periphery Attached, scattered MA/exudates greatest nasally Attached, scattered MA and exudate greatest superior to  disc, white without pressure temporally            IMAGING AND PROCEDURES  Imaging and Procedures for @TODAY @  OCT, Retina - OU - Both Eyes       Right Eye Quality was good. Central Foveal Thickness: 235. Progression has been stable. Findings include normal foveal contour, no SRF, intraretinal fluid, intraretinal hyper-reflective material, vitreomacular adhesion (Persistent IRF/IRHM IN macula).   Left Eye Quality was good. Central Foveal Thickness: 249. Progression has been  stable. Findings include intraretinal fluid, no SRF, abnormal foveal contour, intraretinal hyper-reflective material (Persistent central cystic changes ).   Notes *Images captured and stored on drive  Diagnosis / Impression:  +DME OU-- persistent IRF/IRHM OD: Persistent cystic changes IN macula OS: Persistent central cystic changes   Clinical management:  See below  Abbreviations: NFP - Normal foveal profile. CME - cystoid macular edema. PED - pigment epithelial detachment. IRF - intraretinal fluid. SRF - subretinal fluid. EZ - ellipsoid zone. ERM - epiretinal membrane. ORA - outer retinal atrophy. ORT - outer retinal tubulation. SRHM - subretinal hyper-reflective material               ASSESSMENT/PLAN:    ICD-10-CM   1. Moderate nonproliferative diabetic retinopathy of both eyes with macular edema associated with type 2 diabetes mellitus ([place])  K24.0973     2. Retinal edema  H35.81 OCT, Retina - OU - Both Eyes    3. Essential hypertension  I10     4. Hypertensive retinopathy of both eyes  H35.033     5. Nuclear sclerosis of both eyes  H25.13        1,2. Moderate non-proliferative diabetic retinopathy w/ DME, OU  - delayed f/u from 4 wks to 3.5 mos (07.02.21 to 10.15.21) -- discussed importance of regular follow up and treatments  - pt lost to f/u from 10.23.2019 to 11.18.20  - FA in 2019 showed leaking MA OU; no NV OU  **of note, pt had pruritic rxn to fluorescein dye -- resolved with  12.5 mg IV benadryl**  - FA 12.16.2020 shows late leaking MA's, non-perfusion defects; no NV OU  - s/p IVA OD #1 (12.16.20), #2 (01.25.21), #3 (05.26.21), #4 (07.02.21), #5 (10.15.21), #6 (11.23.21)  - s/p focal laser OD (03.19.21), (04.01.22)  - s/p IVA OS #1 OS (12.23.20), #2 (01.27.21), #3 (03.02.21), #4 (05.26.21), #5 (07.02.21), #6 (10.15.21), #7 (11.23.21), #8 (01.11.22), #9 (03.04.22), #10 (4.27.22)  - exam shows scattered MA, IRH, exudates and mild macular edema OU  - OCT shows OD: Persistent cystic changes IN macula; OS: Persistent central cystic changes  - BCVA OD: 20/20; OS: 20/20 -- both stable             - Recommend holding injections today with follow up in 4 weeks -- pt in agreement  - [place] informed consent form signed and scanned on 01.27.2021  - f/u 4 weeks, sooner prn -- DFE, OCT, possible injections  3,4. Hypertensive retinopathy OU  - discussed importance of tight BP control  - monitor  5. Nuclear Sclerosis   - The symptoms of cataract, surgical options, and treatments and risks were discussed with patient.  - discussed diagnosis and progression  - under the expert management of [facility] [place] Ordered this visit:  No orders of the defined types were placed in this encounter.     Return in about 4 weeks (around 09/16/2020) for f/u NPDR OU, DFE, OCT.  There are no Patient Instructions on file for this visit.  Explained the diagnoses, plan, and follow up with the patient and they expressed understanding.  Patient expressed understanding of the importance of proper follow up care.  This document serves as a record of services personally performed by Gardiner Sleeper, MD, PhD. It was created on their behalf by Estill Bakes, COT an ophthalmic technician. The creation of this record is the provider's dictation and/or activities during the visit.    Electronically signed by: Estill Bakes, COT 6.28.22 @  11:31 PM   This document serves as a record of  services personally performed by Gardiner Sleeper, MD, PhD. It was created on their behalf by San Jetty. Owens Shark, OA an ophthalmic technician. The creation of this record is the provider's dictation and/or activities during the visit.    Electronically signed by: San Jetty. Marguerita Merles 06.28.2022 11:31 PM   Gardiner Sleeper, M.D., Ph.D. Diseases & Surgery of the Retina and Vitreous [facility]  I have reviewed the above documentation for accuracy and completeness, and I agree with the above. Gardiner Sleeper, M.D., Ph.D. 08/19/20 11:31 PM   Abbreviations: M myopia (nearsighted); A astigmatism; H hyperopia (farsighted); P presbyopia; Mrx spectacle prescription;  CTL contact lenses; OD right eye; OS left eye; OU both eyes  XT exotropia; ET esotropia; PEK punctate epithelial keratitis; PEE punctate epithelial erosions; DES dry eye syndrome; MGD meibomian gland dysfunction; ATs artificial tears; PFAT's preservative free artificial tears; [place] nuclear sclerotic cataract; PSC posterior subcapsular cataract; ERM epi-retinal membrane; PVD posterior vitreous detachment; RD retinal detachment; DM diabetes mellitus; DR diabetic retinopathy; NPDR non-proliferative diabetic retinopathy; PDR proliferative diabetic retinopathy; CSME clinically significant macular edema; DME diabetic macular edema; dbh dot blot hemorrhages; CWS cotton wool spot; POAG primary open angle glaucoma; C/D cup-to-disc ratio; HVF humphrey visual field; GVF goldmann visual field; OCT optical coherence tomography; IOP intraocular pressure; BRVO Branch retinal vein occlusion; CRVO central retinal vein occlusion; CRAO central retinal artery occlusion; BRAO branch retinal artery occlusion; RT retinal tear; SB scleral buckle; PPV pars plana vitrectomy; VH Vitreous hemorrhage; PRP panretinal laser photocoagulation; IVK intravitreal kenalog; VMT vitreomacular traction; MH Macular hole;  NVD neovascularization of the disc; NVE  neovascularization elsewhere; AREDS age related eye disease study; ARMD age related macular degeneration; POAG primary open angle glaucoma; EBMD epithelial/anterior basement membrane dystrophy; ACIOL anterior chamber intraocular lens; IOL intraocular lens; PCIOL posterior chamber intraocular lens; Phaco/IOL phacoemulsification with intraocular lens placement; [place] photorefractive keratectomy; LASIK laser assisted in situ keratomileusis; HTN hypertension; DM diabetes mellitus; COPD chronic obstructive pulmonary disease

## 2020-08-20 ENCOUNTER — Encounter: Payer: Self-pay | Admitting: Family Medicine

## 2020-08-20 ENCOUNTER — Ambulatory Visit: Payer: Medicaid Other | Admitting: Family Medicine

## 2020-08-20 VITALS — Ht 62.0 in | Wt 298.0 lb

## 2020-08-20 DIAGNOSIS — M6530 Trigger finger, unspecified finger: Secondary | ICD-10-CM

## 2020-08-20 NOTE — Patient Instructions (Signed)
You have a trigger finger of your ring finger. Do the band-aid splinting I showed you daily for the next 6 weeks. Ok to take this off to bathe/shower. Aleve 2 tabs twice a day with food for 7-10 days then as needed. Follow up with me in 5-6 weeks. If not improving would recommend injection.

## 2020-08-20 NOTE — Progress Notes (Signed)
PCP: Willeen Niece, PA  Subjective:   HPI: Patient is a 45 y.o. female here for right hand pain.  Patient reports for the past 3 weeks she has had pain and catching in the palmar aspect of her right ring finger and palm. Notices this more in the morning. She is right-handed. Associated swelling but no bruising. No associated injury.  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   Cataract    NS OU   Complication of anesthesia    states is hard to wake up post-op   Constipation 12/30/2014   Diabetes mellitus    intolerant to Metformin   Diabetic retinopathy ([place])    NPDR OU   Dry skin    hands    Family history of anesthesia complication    mother went into coma during c-section   Genital herpes 01/01/2006   on acyclovir BID    GERD (gastroesophageal reflux disease)    Herpes genital    Hyperglycemia 12/26/2017   Hypertensive retinopathy    OU   Low back pain 11/22/2012   Lower extremity edema 10/18/2012   Menorrhagia 11/06/2015   Morbid obesity with BMI of 50 to 60 07/23/2009   Obesity hypoventilation syndrome ([place]) 10/30/2012   Obesity, morbid ([place]) 01/10/2014   OSA on CPAP     AHI was on  11-17-12 to 120/hr, titrated to 15 cm with 3 cm EPR/    PCOS (polycystic ovarian syndrome)    Rheumatoid arthritis ([place])    Right ankle pain 06/19/2015   Right hip pain 09/04/2017   Right shoulder pain 06/01/2017   Seasonal allergies    Sleep apnea, obstructive    uses CPAP nightly - had sleep study 08/22/2007    Current Outpatient Medications on File Prior to Visit  Medication Sig Dispense Refill   acyclovir (ZOVIRAX) 400 MG tablet TAKE 1 TABLET BY MOUTH TWICE A DAY 60 tablet 2   albuterol (PROAIR HFA) 108 (90 Base) MCG/ACT inhaler Inhale 2  puffs into the lungs every 4 (four) hours as needed for wheezing or shortness of breath (cough). (Patient taking differently: Inhale 2 puffs into the lungs every 4 (four) hours as needed for wheezing or shortness of breath (or coughing).) 6.7 g 2   atorvastatin (LIPITOR) 10 MG tablet Take 10 mg by mouth daily.     atorvastatin (LIPITOR) 40 MG tablet Take 1 tablet (40 mg total) by mouth at bedtime. 90 tablet 1   benzonatate (TESSALON) 200 MG capsule Take 1 capsule (200 mg total) by mouth 3 (three) times daily as needed for cough. 20 capsule 0   Blood Glucose Monitoring Suppl (ACCU-CHEK AVIVA PLUS) w/Device KIT Please check you blood sugars 4 times a day 1 kit 0   dapagliflozin propanediol (FARXIGA) 5 MG TABS tablet Take 5 mg by mouth daily. (Patient taking differently: Take 5 mg by mouth 2 (two) times a day.) 90 tablet 1   DERMA-SMOOTHE/FS SCALP 0.01 % OIL Apply topically.     fluconazole (DIFLUCAN) 150 MG tablet TAKE 1 TABLET BY MOUTH NOW. THEN REPEAT IN 1 WEEK     FLUoxetine (PROZAC) 20 MG capsule Take by mouth.     furosemide (LASIX) 20 MG tablet Take 20 mg by mouth  daily as needed for fluid or edema.      glucose blood (ACCU-CHEK SMARTVIEW) test strip TEST FOUR TIMES DAILY 150 each 6   HUMULIN R U-500 KWIKPEN 500 UNIT/ML kwikpen Inject into the skin.     hydrochlorothiazide (HYDRODIURIL) 12.5 MG tablet Take by mouth.     insulin aspart (NOVOLOG) 100 UNIT/ML injection Inject 0-20 Units into the skin every 4 (four) hours. CBG 70 - 120: 0 units  CBG 121 - 150: 3 units  CBG 151 - 200: 4 units  CBG 201 - 250: 7 units  CBG 251 - 300: 11 units  CBG 301 - 350: 15 units  CBG 351 - 400: 20 units  CBG > 400 call MD (Patient taking differently: Inject 20 Units into the skin every 4 (four) hours. CBG 70 - 120: 0 units  CBG 121 - 150: 3 units  CBG 151 - 200: 4 units  CBG 201 - 250: 7 units  CBG 251 - 300: 11 units  CBG 301 - 350: 15 units  CBG 351 - 400: 20 units  CBG > 400 call MD) 10 mL 11    insulin aspart protamine - aspart (NOVOLOG MIX 70/30 FLEXPEN) (70-30) 100 UNIT/ML FlexPen Inject into the skin.     Insulin Glargine (LANTUS) 100 UNIT/ML Solostar Pen Inject 80 Units into the skin 2 (two) times daily. 15 pen 0   Insulin Pen Needle (B-D UF III MINI PEN NEEDLES) 31G X 5 MM MISC USE AS DIRECTED TO INJECT INSULIN FIVE A DAY E11.65 100 each 6   JANUVIA 100 MG tablet TAKE 1 TABLET BY MOUTH EVERY DAY 90 tablet 3   lactulose (CEPHULAC) 10 g packet Take 1 packet (10 g total) by mouth daily as needed. Reported on 07/29/2015 (Patient taking differently: Take 10 g by mouth daily as needed (for constipation).) 30 each 0   Lancets (ACCU-CHEK SOFT TOUCH) lancets Use as instructed 100 each 12   loratadine (CLARITIN) 10 MG tablet Take 1 tablet (10 mg total) by mouth 2 (two) times daily. 180 tablet 1   methocarbamol (ROBAXIN) 500 MG tablet Take 1 tablet (500 mg total) by mouth every 8 (eight) hours as needed. (Patient taking differently: Take 500 mg by mouth every 8 (eight) hours as needed for muscle spasms.) 60 tablet 1   mometasone (NASONEX) 50 MCG/ACT nasal spray Place into the nose.     mometasone-formoterol (DULERA) 200-5 MCG/ACT AERO Inhale 2 puffs into the lungs 2 (two) times daily. 13 Inhaler 1   montelukast (SINGULAIR) 10 MG tablet Take 1 tablet (10 mg total) by mouth at bedtime. 90 tablet 1   Naltrexone-buPROPion HCl ER (CONTRAVE) 8-90 MG TB12 Take by mouth.     nitroGLYCERIN (NITRODUR - DOSED IN MG/24 HR) 0.2 mg/hr patch Apply 1/4th patch to affected achilles, change daily 30 patch 1   Olopatadine HCl 0.2 % SOLN Place one drop into both eyes daily.     omeprazole (PRILOSEC) 20 MG capsule Take 1 capsule (20 mg total) by mouth daily before breakfast. 90 capsule 2   SPIRIVA RESPIMAT 1.25 MCG/ACT AERS SMARTSIG:2 Puff(s) Via Inhaler Daily     topiramate (TOPAMAX) 25 MG tablet Take by mouth.     Current Facility-Administered Medications on File Prior to Visit  Medication Dose Route Frequency  Provider Last Rate Last Admin   diphenhydrAMINE (BENADRYL) injection 12.5 mg  12.5 mg Intramuscular Once Bernarda Caffey, MD        Past Surgical History:  Procedure Laterality Date  CARPAL TUNNEL RELEASE Right 10/02/2012   Procedure: RIGHT CARPAL TUNNEL RELEASE ENDOSCOPIC;  Surgeon: Jolyn Nap, MD;  Location: [facility];  Service: Orthopedics;  Laterality: Right;   CHOLECYSTECTOMY N/A 12/02/2014   Procedure: LAPAROSCOPIC CHOLECYSTECTOMY;  Surgeon: Armandina Gemma, MD;  Location: [facility];  Service: General;  Laterality: N/A;   HYSTEROSCOPY WITH D & C  01-08-2002    Allergies  Allergen Reactions   Bee Venom Anaphylaxis and Swelling   Wasp Venom Anaphylaxis and Swelling   Citrus Hives and Itching   Latex Hives and Swelling   Penicillins Hives and Itching    Has patient had a PCN reaction causing immediate rash, facial/tongue/throat swelling, SOB or lightheadedness with hypotension: Yes Has patient had a PCN reaction causing severe rash involving mucus membranes or skin necrosis: Unk Has patient had a PCN reaction that required hospitalization: Was already in hosp Has patient had a PCN reaction occurring within the last 10 years: No If all of the above answers are "NO", then may proceed with Cephalosporin use.    Shellfish Allergy Other (See Comments) and Swelling    Patient is uncertain; stated that she tolerates this (??)   Soap Hives, Itching and Swelling    PUREX LAUNDRY DETERGENT   Tomato Hives and Itching   Banana Itching and Nausea Only   Chocolate Itching   Fluticasone Other (See Comments)    Per patient, makes sneeze and gag   Hydrocodone Itching and Nausea Only   Other Itching    Acidic foods- Itching [place]- Itching in the roof of the mouth   Adhesive [Tape] Itching and Rash    Allergic to  "leads"   Doxycycline Other (See Comments)    AFFECTS JOINTS   Liraglutide Nausea Only   Metformin And Related Nausea Only, Rash, Other (See Comments) and  Diarrhea    GI UPSET, also   Red Dye Itching, Rash and Other (See Comments)    At eye doctor's office, the dilating drops- Skin breaks out, chest turned red, itching, face became swollen    Social History   Socioeconomic History   Marital status: Divorced    Spouse name: Not on file   Number of children: 2   Years of education: 14   Highest education level: Not on file  Occupational History   Occupation: UNEMPLOYED    Employer: UNEMPLOYED  Tobacco Use   Smoking status: Former    Years: 10.00    Pack years: 0.00    Types: Cigarettes    Quit date: 05/24/2010    Years since quitting: 10.2   Smokeless tobacco: Never  Vaping Use   Vaping Use: Never used  Substance and Sexual Activity   Alcohol use: Yes    Alcohol/week: 0.0 standard drinks    Comment: socially   Drug use: No   Sexual activity: Yes    Birth control/protection: None    Comment: Lives w/a partner  Other Topics Concern   Not on file  Social History Narrative   Single, but lives with a partner   Has 2 children and cares for an infant during the day         Social Determinants of Health   Financial Resource Strain: Not on file  Food Insecurity: Not on file  Transportation Needs: Not on file  Physical Activity: Not on file  Stress: Not on file  Social Connections: Not on file  Intimate Partner Violence: Not on file    Family History  Problem Relation  Age of Onset   Other Mother    Leukemia Cousin    Other Other    Obesity Other    Other Other    Diabetes Maternal Aunt    Heart attack Maternal Aunt    Alzheimer's disease Maternal Uncle    Cancer Maternal Uncle        prostate   Diabetes Paternal Aunt    Cancer Paternal Aunt        brain,mouth   Alzheimer's disease Paternal Aunt    Diabetes Paternal Uncle    Diabetes Maternal Grandmother    Diabetes Maternal Grandfather     Ht _0  (1.575 m)   Wt 298 lb (135.2 kg)   BMI 54.50 kg/m   No flowsheet data found.  No flowsheet data  found.  Review of Systems: See HPI above.     Objective:  Physical Exam:  Gen: NAD, comfortable in exam room  Right hand: No gross deformity, swelling, or bruising.  Nodule felt at the A1 pulley of the fourth digit. Tenderness to palpation about the A1 pulley area of the fourth digit on the palmar aspect. Full range of motion at the MCP, PIP, DIP joints of the fourth digit. Collateral ligaments intact as well at these joints. Neurovascular intact distally.   Assessment & Plan:  1.  Right fourth digit trigger finger: We discussed options and patient opted for the Band-Aid splinting technique of the PIP joint.  She will also take Aleve twice a day for 7 to 10 days then as needed.  She will follow-up in 5 to 6 weeks.  Consider injection if not improving.

## 2020-09-16 ENCOUNTER — Encounter (INDEPENDENT_AMBULATORY_CARE_PROVIDER_SITE_OTHER): Payer: Medicaid Other | Admitting: Ophthalmology

## 2020-09-16 NOTE — Progress Notes (Signed)
[facility] Note  09/17/2020     CHIEF COMPLAINT Patient presents for Retina Follow Up   HISTORY OF PRESENT ILLNESS: Ana Long is a 45 y.o. female who presents to the clinic today for:   HPI     Retina Follow Up   Patient presents with  Diabetic Retinopathy.  In both eyes.  Duration of 4 weeks.  Since onset it is stable.  I, the attending physician,  performed the HPI with the patient and updated documentation appropriately.        Comments   4 week follow up NPDR OU-  Vision appears stable OU.  BS 218 about 1 hour ago- she did not check when she woke up A1C 10.7      Last edited by Bernarda Caffey, MD on 09/17/2020 10:45 PM.      Referring physician: [name] MD [place], [place] [id number]  HISTORICAL INFORMATION:   Selected notes from the [facility] Re-referred by Dr. [name] for DM exam   CURRENT MEDICATIONS: Current Outpatient Medications (Ophthalmic Drugs)  Medication Sig   Olopatadine HCl 0.2 % SOLN Place one drop into both eyes daily.   No current facility-administered medications for this visit. (Ophthalmic Drugs)   Current Outpatient Medications (Other)  Medication Sig   acyclovir (ZOVIRAX) 400 MG tablet TAKE 1 TABLET BY MOUTH TWICE A DAY   albuterol (PROAIR HFA) 108 (90 Base) MCG/ACT inhaler Inhale 2 puffs into the lungs every 4 (four) hours as needed for wheezing or shortness of breath (cough). (Patient taking differently: Inhale 2 puffs into the lungs every 4 (four) hours as needed for wheezing or shortness of breath (or coughing).)   atorvastatin (LIPITOR) 40 MG tablet Take 1 tablet (40 mg total) by mouth at bedtime.   dapagliflozin propanediol (FARXIGA) 5 MG TABS tablet Take 5 mg by mouth daily. (Patient taking differently: Take 5 mg by mouth 2 (two) times a day.)   DERMA-SMOOTHE/FS SCALP 0.01 % OIL Apply topically.   fluconazole (DIFLUCAN) 150 MG tablet TAKE 1 TABLET BY MOUTH  NOW. THEN REPEAT IN 1 WEEK   FLUoxetine (PROZAC) 20 MG capsule Take by mouth.   furosemide (LASIX) 20 MG tablet Take 20 mg by mouth daily as needed for fluid or edema.    HUMULIN R U-500 KWIKPEN 500 UNIT/ML kwikpen Inject into the skin.   hydrochlorothiazide (HYDRODIURIL) 12.5 MG tablet Take by mouth.   insulin aspart (NOVOLOG) 100 UNIT/ML injection Inject 0-20 Units into the skin every 4 (four) hours. CBG 70 - 120: 0 units  CBG 121 - 150: 3 units  CBG 151 - 200: 4 units  CBG 201 - 250: 7 units  CBG 251 - 300: 11 units  CBG 301 - 350: 15 units  CBG 351 - 400: 20 units  CBG > 400 call MD (Patient taking differently: Inject 20 Units into the skin every 4 (four) hours. CBG 70 - 120: 0 units  CBG 121 - 150: 3 units  CBG 151 - 200: 4 units  CBG 201 - 250: 7 units  CBG 251 - 300: 11 units  CBG 301 - 350: 15 units  CBG 351 - 400: 20 units  CBG > 400 call MD)   insulin aspart protamine - aspart (NOVOLOG MIX 70/30 FLEXPEN) (70-30) 100 UNIT/ML FlexPen Inject into the skin.   Insulin Pen Needle (B-D UF III MINI PEN NEEDLES) 31G X 5 MM MISC USE AS DIRECTED  TO INJECT INSULIN FIVE A DAY E11.65   JANUVIA 100 MG tablet TAKE 1 TABLET BY MOUTH EVERY DAY   lactulose (CEPHULAC) 10 g packet Take 1 packet (10 g total) by mouth daily as needed. Reported on 07/29/2015 (Patient taking differently: Take 10 g by mouth daily as needed (for constipation).)   loratadine (CLARITIN) 10 MG tablet Take 1 tablet (10 mg total) by mouth 2 (two) times daily.   methocarbamol (ROBAXIN) 500 MG tablet Take 1 tablet (500 mg total) by mouth every 8 (eight) hours as needed. (Patient taking differently: Take 500 mg by mouth every 8 (eight) hours as needed for muscle spasms.)   mometasone (NASONEX) 50 MCG/ACT nasal spray Place into the nose.   mometasone-formoterol (DULERA) 200-5 MCG/ACT AERO Inhale 2 puffs into the lungs 2 (two) times daily.   montelukast (SINGULAIR) 10 MG tablet Take 1 tablet (10 mg total) by mouth at bedtime.    Naltrexone-buPROPion HCl ER (CONTRAVE) 8-90 MG TB12 Take by mouth.   nitroGLYCERIN (NITRODUR - DOSED IN MG/24 HR) 0.2 mg/hr patch Apply 1/4th patch to affected achilles, change daily   omeprazole (PRILOSEC) 20 MG capsule Take 1 capsule (20 mg total) by mouth daily before breakfast.   SPIRIVA RESPIMAT 1.25 MCG/ACT AERS SMARTSIG:2 Puff(s) Via Inhaler Daily   topiramate (TOPAMAX) 25 MG tablet Take by mouth.   atorvastatin (LIPITOR) 10 MG tablet Take 10 mg by mouth daily.   benzonatate (TESSALON) 200 MG capsule Take 1 capsule (200 mg total) by mouth 3 (three) times daily as needed for cough. (Patient not taking: Reported on 09/17/2020)   Blood Glucose Monitoring Suppl (ACCU-CHEK AVIVA PLUS) w/Device KIT Please check you blood sugars 4 times a day   glucose blood (ACCU-CHEK SMARTVIEW) test strip TEST FOUR TIMES DAILY   Insulin Glargine (LANTUS) 100 UNIT/ML Solostar Pen Inject 80 Units into the skin 2 (two) times daily.   Lancets (ACCU-CHEK SOFT TOUCH) lancets Use as instructed   Current Facility-Administered Medications (Other)  Medication Route   diphenhydrAMINE (BENADRYL) injection 12.5 mg Intramuscular    REVIEW OF SYSTEMS: ROS   Positive for: Gastrointestinal, Endocrine, Eyes, Respiratory Negative for: Constitutional, Neurological, Skin, Genitourinary, Musculoskeletal, HENT, Cardiovascular, Psychiatric, Allergic/Imm, Heme/Lymph Last edited by Leonie Douglas, COA on 09/17/2020  1:11 PM.      ALLERGIES Allergies  Allergen Reactions   Bee Venom Anaphylaxis and Swelling   Wasp Venom Anaphylaxis and Swelling   Citrus Hives and Itching   Latex Hives and Swelling   Penicillins Hives and Itching    Has patient had a PCN reaction causing immediate rash, facial/tongue/throat swelling, SOB or lightheadedness with hypotension: Yes Has patient had a PCN reaction causing severe rash involving mucus membranes or skin necrosis: Unk Has patient had a PCN reaction that required hospitalization: Was  already in hosp Has patient had a PCN reaction occurring within the last 10 years: No If all of the above answers are "NO", then may proceed with Cephalosporin use.    Shellfish Allergy Other (See Comments) and Swelling    Patient is uncertain; stated that she tolerates this (??)   Soap Hives, Itching and Swelling    PUREX LAUNDRY DETERGENT   Tomato Hives and Itching   Banana Itching and Nausea Only   Chocolate Itching   Fluticasone Other (See Comments)    Per patient, makes sneeze and gag   Hydrocodone Itching and Nausea Only   Other Itching    Acidic foods- Itching [place]- Itching in the roof of the mouth  Adhesive [Tape] Itching and Rash    Allergic to  "leads"   Doxycycline Other (See Comments)    AFFECTS JOINTS   Liraglutide Nausea Only   Metformin And Related Nausea Only, Rash, Other (See Comments) and Diarrhea    GI UPSET, also   Red Dye Itching, Rash and Other (See Comments)    At eye doctor's office, the dilating drops- Skin breaks out, chest turned red, itching, face became swollen    PAST MEDICAL HISTORY 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   Cataract    NS OU   Complication of anesthesia    states is hard to wake up post-op   Constipation 12/30/2014   Diabetes mellitus    intolerant to Metformin   Diabetic retinopathy ([place])    NPDR OU   Dry skin    hands    Family history of anesthesia complication    mother went into coma during c-section   Genital herpes 01/01/2006   on acyclovir BID    GERD (gastroesophageal reflux disease)    Herpes genital    Hyperglycemia 12/26/2017   Hypertensive retinopathy    OU   Low back pain 11/22/2012   Lower extremity edema 10/18/2012   Menorrhagia 11/06/2015   Morbid  obesity with BMI of 50 to 60 07/23/2009   Obesity hypoventilation syndrome ([place]) 10/30/2012   Obesity, morbid ([place]) 01/10/2014   OSA on CPAP     AHI was on  11-17-12 to 120/hr, titrated to 15 cm with 3 cm EPR/    PCOS (polycystic ovarian syndrome)    Rheumatoid arthritis (HCC)    Right ankle pain 06/19/2015   Right hip pain 09/04/2017   Right shoulder pain 06/01/2017   Seasonal allergies    Sleep apnea, obstructive    uses CPAP nightly - had sleep study 08/22/2007   Past Surgical History:  Procedure Laterality Date   CARPAL TUNNEL RELEASE Right 10/02/2012   Procedure: RIGHT CARPAL TUNNEL RELEASE ENDOSCOPIC;  Surgeon: Jolyn Nap, MD;  Location: [facility];  Service: Orthopedics;  Laterality: Right;   CHOLECYSTECTOMY N/A 12/02/2014   Procedure: LAPAROSCOPIC CHOLECYSTECTOMY;  Surgeon: Armandina Gemma, MD;  Location: [facility];  Service: General;  Laterality: N/A;   HYSTEROSCOPY WITH D & C  01-08-2002    FAMILY HISTORY Family History  Problem Relation Age of Onset   Other Mother    Leukemia Cousin    Other Other    Obesity Other    Other Other    Diabetes Maternal Aunt    Heart attack Maternal Aunt    Alzheimer's disease Maternal Uncle    Cancer Maternal Uncle        prostate   Diabetes Paternal Aunt    Cancer Paternal Aunt        brain,mouth   Alzheimer's disease Paternal Aunt    Diabetes Paternal Uncle    Diabetes Maternal Grandmother    Diabetes Maternal Grandfather     SOCIAL HISTORY Social History   Tobacco Use   Smoking status: Former    Years: 10.00    Types: Cigarettes    Quit date: 05/24/2010    Years since quitting: 10.3   Smokeless tobacco: Never  Vaping Use  Vaping Use: Never used  Substance Use Topics   Alcohol use: Yes    Alcohol/week: 0.0 standard drinks    Comment: socially   Drug use: No         OPHTHALMIC EXAM:  Base Eye Exam     Visual Acuity (Snellen - Linear)       Right Left   Dist cc 20/40- 20/30+   Dist ph cc 20/25  20/20-    Correction: Glasses         Tonometry (Tonopen, 1:17 PM)       Right Left   Pressure 17 18         Pupils       Dark Light Shape React APD   Right 3 2 Round Brisk None   Left 3 2 Round Brisk None         Visual Fields (Counting fingers)       Left Right    Full Full         Extraocular Movement       Right Left    Full Full         Neuro/Psych     Oriented x3: Yes   Mood/Affect: Normal         Dilation     Both eyes: 1.0% Mydriacyl, 2.5% Phenylephrine @ 1:17 PM           Slit Lamp and Fundus Exam     Slit Lamp Exam       Right Left   Lids/Lashes Dermatochalasis - upper lid, mild Meibomian gland dysfunction Dermatochalasis - upper lid, mild Meibomian gland dysfunction   Conjunctiva/Sclera White and quiet White and quiet   Cornea 1-2+Punctate epithelial erosions 2-3+fine Punctate epithelial erosions   Anterior Chamber deep and quiet deep and quiet   Iris round and dilated, No NVI round and dilated   Lens 1+ NS, 1+CC 1+NS, 1+CC   Vitreous mild vitreous syneresis mild vitreous syneresis         Fundus Exam       Right Left   Disc Pink and sharp, +cupping Pink and sharp, +cupping   C/D Ratio 0.7 0.6   Macula good foveal reflex, focal exudates inferior to fovea and scattered superior macula, scattered MA, focal cystic changes IN macula, focal laser changes infeiror macula, interval increase in cystic changes good foveal reflex, scatttered MA's and exudate, central cystic changes -- persistent/improved, focal cluster of exudates temporal macula, improved   Vessels attenuated, Tortuous Vascular attenuation, Tortuous, Copper wiring   Periphery Attached, scattered MA/exudates greatest nasally Attached, scattered MA and exudate greatest superior to disc, white without pressure temporally           Refraction     Wearing Rx       Sphere Cylinder Axis   Right -2.50 +1.50 013   Left -2.25 Sphere     Type: SVL             IMAGING AND PROCEDURES  Imaging and Procedures for _0 @  OCT, Retina - OU - Both Eyes       Right Eye Quality was good. Central Foveal Thickness: 268. Progression has worsened. Findings include no SRF, intraretinal fluid, intraretinal hyper-reflective material, vitreomacular adhesion , abnormal foveal contour (Interval increase in IRF--re development of central cystic changes).   Left Eye Quality was good. Central Foveal Thickness: 241. Progression has been stable. Findings include intraretinal fluid, no SRF, abnormal foveal contour, intraretinal hyper-reflective material (Mild interval improvement in central  cyst, nasal fovea).   Notes *Images captured and stored on drive  Diagnosis / Impression:  +DME OU-- persistent IRF/IRHM OD: Interval increase in IRF--re development of central cystic changes OS: Mild interval improvement in central cyst, nasal fovea  Clinical management:  See below  Abbreviations: NFP - Normal foveal profile. CME - cystoid macular edema. PED - pigment epithelial detachment. IRF - intraretinal fluid. SRF - subretinal fluid. EZ - ellipsoid zone. ERM - epiretinal membrane. ORA - outer retinal atrophy. ORT - outer retinal tubulation. SRHM - subretinal hyper-reflective material      Intravitreal Injection, Pharmacologic Agent - OD - Right Eye       Time Out 09/17/2020. 1:51 PM. Confirmed correct patient, procedure, site, and patient consented.   Anesthesia Topical anesthesia was used. Anesthetic medications included Lidocaine 2%, Proparacaine 0.5%.   Procedure Preparation included 5% betadine to ocular surface, eyelid speculum. A (32g) needle was used.   Injection: 1.25 mg Bevacizumab 1.27m/0.05ml   Route: Intravitreal, Site: Right Eye   NDC: 50242-060-01, Lot:: [id number] Expiration date: 10/20/2020, Waste: 0.05 mL   Post-op Post injection exam found visual acuity of at least counting fingers. The patient tolerated the procedure well. There were  no complications. The patient received written and verbal post procedure care education. Post injection medications were not given.            ASSESSMENT/PLAN:    ICD-10-CM   1. Moderate nonproliferative diabetic retinopathy of both eyes with macular edema associated with type 2 diabetes mellitus (HCC)  EP50.9326Intravitreal Injection, Pharmacologic Agent - OD - Right Eye    Bevacizumab (AVASTIN) SOLN 1.25 mg    2. Retinal edema  H35.81 OCT, Retina - OU - Both Eyes    3. Essential hypertension  I10     4. Hypertensive retinopathy of both eyes  H35.033     5. Nuclear sclerosis of both eyes  H25.13       1,2. Moderate non-proliferative diabetic retinopathy w/ DME, OU  - delayed f/u from 4 wks to 3.5 mos (07.02.21 to 10.15.21) -- discussed importance of regular follow up and treatments  - pt lost to f/u from 10.23.2019 to 11.18.20  - FA in 2019 showed leaking MA OU; no NV OU  **of note, pt had pruritic rxn to fluorescein dye -- resolved with 12.5 mg IV benadryl**  - FA 12.16.2020 shows late leaking MA's, non-perfusion defects; no NV OU  - s/p IVA OD #1 (12.16.20), #2 (01.25.21), #3 (05.26.21), #4 (07.02.21), #5 (10.15.21), #6 (11.23.21)  - s/p focal laser OD (03.19.21), (04.01.22)  - s/p IVA OS #1 OS (12.23.20), #2 (01.27.21), #3 (03.02.21), #4 (05.26.21), #5 (07.02.21), #6 (10.15.21), #7 (11.23.21), #8 (01.11.22), #9 (03.04.22), #10 (4.27.22)  - exam shows scattered MA, IRH, exudates and mild macular edema OU  - OCT shows OZT:IWPYKDXIre development of cystic changes; OS: Mild interval improvement in central cystic changes  - BCVA OD: 20/25; OS: 20/20 -- decrease in vision OD, consistent with re-development of cystic changes             - Recommend IVA OD #7 today (07.27.22)  - pt wishes to proceed with IVA - RBA of procedure discussed, questions answered - see procedure note   - Avastin informed consent form signed and scanned on 01.27.2021  - f/u 4 weeks, sooner prn -- DFE,  OCT, possible injections  3,4. Hypertensive retinopathy OU  - discussed importance of tight BP control  - monitor  5. Nuclear Sclerosis   -  The symptoms of cataract, surgical options, and treatments and risks were discussed with patient.  - discussed diagnosis and progression  - under the expert management of [facility] Ordered this visit:  Meds ordered this encounter  Medications   Bevacizumab (AVASTIN) SOLN 1.25 mg       Return in about 6 weeks (around 10/29/2020) for 6 weeks NPDR OU, DFE/OCT.  There are no Patient Instructions on file for this visit.  Explained the diagnoses, plan, and follow up with the patient and they expressed understanding.  Patient expressed understanding of the importance of proper follow up care.  This document serves as a record of services personally performed by Gardiner Sleeper, MD, PhD. It was created on their behalf by Roselee Nova, COMT. The creation of this record is the provider's dictation and/or activities during the visit.  Electronically signed by: Roselee Nova, COMT 09/17/20 10:52 PM  This document serves as a record of services personally performed by Gardiner Sleeper, MD, PhD. It was created on their behalf by Leeann Must, [place], an ophthalmic technician. The creation of this record is the provider's dictation and/or activities during the visit.    Electronically signed by: Leeann Must, COA _0 @ 10:52 PM   Gardiner Sleeper, M.D., Ph.D. Diseases & Surgery of the Retina and Vitreous [facility]  I have reviewed the above documentation for accuracy and completeness, and I agree with the above. Gardiner Sleeper, M.D., Ph.D. 09/17/20 10:52 PM  Abbreviations: M myopia (nearsighted); A astigmatism; H hyperopia (farsighted); P presbyopia; Mrx spectacle prescription;  CTL contact lenses; OD right eye; OS left eye; OU both eyes  XT exotropia; ET esotropia; PEK punctate epithelial keratitis; PEE  punctate epithelial erosions; DES dry eye syndrome; MGD meibomian gland dysfunction; ATs artificial tears; PFAT's preservative free artificial tears; [place] nuclear sclerotic cataract; PSC posterior subcapsular cataract; ERM epi-retinal membrane; PVD posterior vitreous detachment; RD retinal detachment; DM diabetes mellitus; DR diabetic retinopathy; NPDR non-proliferative diabetic retinopathy; PDR proliferative diabetic retinopathy; CSME clinically significant macular edema; DME diabetic macular edema; dbh dot blot hemorrhages; CWS cotton wool spot; POAG primary open angle glaucoma; C/D cup-to-disc ratio; HVF humphrey visual field; GVF goldmann visual field; OCT optical coherence tomography; IOP intraocular pressure; BRVO Branch retinal vein occlusion; CRVO central retinal vein occlusion; CRAO central retinal artery occlusion; BRAO branch retinal artery occlusion; RT retinal tear; SB scleral buckle; PPV pars plana vitrectomy; VH Vitreous hemorrhage; PRP panretinal laser photocoagulation; IVK intravitreal kenalog; VMT vitreomacular traction; MH Macular hole;  NVD neovascularization of the disc; NVE neovascularization elsewhere; AREDS age related eye disease study; ARMD age related macular degeneration; POAG primary open angle glaucoma; EBMD epithelial/anterior basement membrane dystrophy; ACIOL anterior chamber intraocular lens; IOL intraocular lens; PCIOL posterior chamber intraocular lens; Phaco/IOL phacoemulsification with intraocular lens placement; [place] photorefractive keratectomy; LASIK laser assisted in situ keratomileusis; HTN hypertension; DM diabetes mellitus; COPD chronic obstructive pulmonary disease

## 2020-09-17 ENCOUNTER — Other Ambulatory Visit: Payer: Self-pay

## 2020-09-17 ENCOUNTER — Ambulatory Visit (INDEPENDENT_AMBULATORY_CARE_PROVIDER_SITE_OTHER): Payer: Medicaid Other | Admitting: Ophthalmology

## 2020-09-17 ENCOUNTER — Encounter (INDEPENDENT_AMBULATORY_CARE_PROVIDER_SITE_OTHER): Payer: Self-pay | Admitting: Ophthalmology

## 2020-09-17 DIAGNOSIS — I10 Essential (primary) hypertension: Secondary | ICD-10-CM

## 2020-09-17 DIAGNOSIS — H3581 Retinal edema: Secondary | ICD-10-CM | POA: Diagnosis not present

## 2020-09-17 DIAGNOSIS — H2513 Age-related nuclear cataract, bilateral: Secondary | ICD-10-CM

## 2020-09-17 DIAGNOSIS — H35033 Hypertensive retinopathy, bilateral: Secondary | ICD-10-CM

## 2020-09-17 DIAGNOSIS — E113313 Type 2 diabetes mellitus with moderate nonproliferative diabetic retinopathy with macular edema, bilateral: Secondary | ICD-10-CM | POA: Diagnosis not present

## 2020-09-17 MED ORDER — BEVACIZUMAB CHEMO INJECTION 1.25MG/0.05ML SYRINGE FOR KALEIDOSCOPE
1.2500 mg | INTRAVITREAL | Status: AC | PRN
  Administered 2020-09-17: 1.25 mg via INTRAVITREAL

## 2020-09-24 ENCOUNTER — Ambulatory Visit (INDEPENDENT_AMBULATORY_CARE_PROVIDER_SITE_OTHER): Payer: Medicaid Other | Admitting: Family Medicine

## 2020-09-24 ENCOUNTER — Other Ambulatory Visit: Payer: Self-pay

## 2020-09-24 ENCOUNTER — Encounter: Payer: Self-pay | Admitting: Family Medicine

## 2020-09-24 VITALS — BP 126/67 | Ht 62.0 in | Wt 303.0 lb

## 2020-09-24 DIAGNOSIS — M6530 Trigger finger, unspecified finger: Secondary | ICD-10-CM

## 2020-09-24 NOTE — Progress Notes (Signed)
PCP: Willeen Niece, PA  Subjective:   HPI: Patient is a 45 y.o. female here for right hand pain.  6/29: Patient reports for the past 3 weeks she has had pain and catching in the palmar aspect of her right ring finger and palm. Notices this more in the morning. She is right-handed. Associated swelling but no bruising. No associated injury.  8/3: Patient reports she's doing better. Finger still catches at times but less frequent. Doing band-aid splinting as directed the past 5 weeks. No new injuries.  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   Cataract    NS OU   Complication of anesthesia    states is hard to wake up post-op   Constipation 12/30/2014   Diabetes mellitus    intolerant to Metformin   Diabetic retinopathy ([place])    NPDR OU   Dry skin    hands    Family history of anesthesia complication    mother went into coma during c-section   Genital herpes 01/01/2006   on acyclovir BID    GERD (gastroesophageal reflux disease)    Herpes genital    Hyperglycemia 12/26/2017   Hypertensive retinopathy    OU   Low back pain 11/22/2012   Lower extremity edema 10/18/2012   Menorrhagia 11/06/2015   Morbid obesity with BMI of 50 to 60 07/23/2009   Obesity hypoventilation syndrome ([place]) 10/30/2012   Obesity, morbid ([place]) 01/10/2014   OSA on CPAP     AHI was on  11-17-12 to 120/hr, titrated to 15 cm with 3 cm EPR/    PCOS (polycystic ovarian syndrome)    Rheumatoid arthritis ([name])    Right ankle pain 06/19/2015   Right hip pain 09/04/2017   Right shoulder pain 06/01/2017   Seasonal allergies    Sleep apnea, obstructive    uses CPAP nightly - had sleep study 08/22/2007    Current Outpatient Medications on File Prior to Visit   Medication Sig Dispense Refill   acyclovir (ZOVIRAX) 400 MG tablet TAKE 1 TABLET BY MOUTH TWICE A DAY 60 tablet 2   albuterol (PROAIR HFA) 108 (90 Base) MCG/ACT inhaler Inhale 2 puffs into the lungs every 4 (four) hours as needed for wheezing or shortness of breath (cough). (Patient taking differently: Inhale 2 puffs into the lungs every 4 (four) hours as needed for wheezing or shortness of breath (or coughing).) 6.7 g 2   atorvastatin (LIPITOR) 10 MG tablet Take 10 mg by mouth daily.     atorvastatin (LIPITOR) 40 MG tablet Take 1 tablet (40 mg total) by mouth at bedtime. 90 tablet 1   benzonatate (TESSALON) 200 MG capsule Take 1 capsule (200 mg total) by mouth 3 (three) times daily as needed for cough. (Patient not taking: Reported on 09/17/2020) 20 capsule 0   Blood Glucose Monitoring Suppl (ACCU-CHEK AVIVA PLUS) w/Device KIT Please check you blood sugars 4 times a day 1 kit 0   dapagliflozin propanediol (FARXIGA) 5 MG TABS tablet Take 5 mg by mouth daily. (Patient taking differently: Take 5 mg by mouth 2 (two) times a day.) 90 tablet 1   DERMA-SMOOTHE/FS SCALP 0.01 % OIL Apply topically.     fluconazole (DIFLUCAN) 150 MG tablet TAKE 1 TABLET  BY MOUTH NOW. THEN REPEAT IN 1 WEEK     FLUoxetine (PROZAC) 20 MG capsule Take by mouth.     furosemide (LASIX) 20 MG tablet Take 20 mg by mouth daily as needed for fluid or edema.      glucose blood (ACCU-CHEK SMARTVIEW) test strip TEST FOUR TIMES DAILY 150 each 6   HUMULIN R U-500 KWIKPEN 500 UNIT/ML kwikpen Inject into the skin.     hydrochlorothiazide (HYDRODIURIL) 12.5 MG tablet Take by mouth.     insulin aspart (NOVOLOG) 100 UNIT/ML injection Inject 0-20 Units into the skin every 4 (four) hours. CBG 70 - 120: 0 units  CBG 121 - 150: 3 units  CBG 151 - 200: 4 units  CBG 201 - 250: 7 units  CBG 251 - 300: 11 units  CBG 301 - 350: 15 units  CBG 351 - 400: 20 units  CBG > 400 call MD (Patient taking differently: Inject 20 Units into the skin every 4  (four) hours. CBG 70 - 120: 0 units  CBG 121 - 150: 3 units  CBG 151 - 200: 4 units  CBG 201 - 250: 7 units  CBG 251 - 300: 11 units  CBG 301 - 350: 15 units  CBG 351 - 400: 20 units  CBG > 400 call MD) 10 mL 11   insulin aspart protamine - aspart (NOVOLOG MIX 70/30 FLEXPEN) (70-30) 100 UNIT/ML FlexPen Inject into the skin.     Insulin Glargine (LANTUS) 100 UNIT/ML Solostar Pen Inject 80 Units into the skin 2 (two) times daily. 15 pen 0   Insulin Pen Needle (B-D UF III MINI PEN NEEDLES) 31G X 5 MM MISC USE AS DIRECTED TO INJECT INSULIN FIVE A DAY E11.65 100 each 6   JANUVIA 100 MG tablet TAKE 1 TABLET BY MOUTH EVERY DAY 90 tablet 3   lactulose (CEPHULAC) 10 g packet Take 1 packet (10 g total) by mouth daily as needed. Reported on 07/29/2015 (Patient taking differently: Take 10 g by mouth daily as needed (for constipation).) 30 each 0   Lancets (ACCU-CHEK SOFT TOUCH) lancets Use as instructed 100 each 12   loratadine (CLARITIN) 10 MG tablet Take 1 tablet (10 mg total) by mouth 2 (two) times daily. 180 tablet 1   methocarbamol (ROBAXIN) 500 MG tablet Take 1 tablet (500 mg total) by mouth every 8 (eight) hours as needed. (Patient taking differently: Take 500 mg by mouth every 8 (eight) hours as needed for muscle spasms.) 60 tablet 1   mometasone (NASONEX) 50 MCG/ACT nasal spray Place into the nose.     mometasone-formoterol (DULERA) 200-5 MCG/ACT AERO Inhale 2 puffs into the lungs 2 (two) times daily. 13 Inhaler 1   montelukast (SINGULAIR) 10 MG tablet Take 1 tablet (10 mg total) by mouth at bedtime. 90 tablet 1   Naltrexone-buPROPion HCl ER (CONTRAVE) 8-90 MG TB12 Take by mouth.     nitroGLYCERIN (NITRODUR - DOSED IN MG/24 HR) 0.2 mg/hr patch Apply 1/4th patch to affected achilles, change daily 30 patch 1   Olopatadine HCl 0.2 % SOLN Place one drop into both eyes daily.     omeprazole (PRILOSEC) 20 MG capsule Take 1 capsule (20 mg total) by mouth daily before breakfast. 90 capsule 2   SPIRIVA  RESPIMAT 1.25 MCG/ACT AERS SMARTSIG:2 Puff(s) Via Inhaler Daily     topiramate (TOPAMAX) 25 MG tablet Take by mouth.     Current Facility-Administered Medications on File Prior to Visit  Medication Dose Route Frequency  Provider Last Rate Last Admin   diphenhydrAMINE (BENADRYL) injection 12.5 mg  12.5 mg Intramuscular Once Bernarda Caffey, MD        Past Surgical History:  Procedure Laterality Date   CARPAL TUNNEL RELEASE Right 10/02/2012   Procedure: RIGHT CARPAL TUNNEL RELEASE ENDOSCOPIC;  Surgeon: Jolyn Nap, MD;  Location: [facility];  Service: Orthopedics;  Laterality: Right;   CHOLECYSTECTOMY N/A 12/02/2014   Procedure: LAPAROSCOPIC CHOLECYSTECTOMY;  Surgeon: Armandina Gemma, MD;  Location: [facility];  Service: General;  Laterality: N/A;   HYSTEROSCOPY WITH D & C  01-08-2002    Allergies  Allergen Reactions   Bee Venom Anaphylaxis and Swelling   Wasp Venom Anaphylaxis and Swelling   Citrus Hives and Itching   Latex Hives and Swelling   Penicillins Hives and Itching    Has patient had a PCN reaction causing immediate rash, facial/tongue/throat swelling, SOB or lightheadedness with hypotension: Yes Has patient had a PCN reaction causing severe rash involving mucus membranes or skin necrosis: Unk Has patient had a PCN reaction that required hospitalization: Was already in hosp Has patient had a PCN reaction occurring within the last 10 years: No If all of the above answers are "NO", then may proceed with Cephalosporin use.    Shellfish Allergy Other (See Comments) and Swelling    Patient is uncertain; stated that she tolerates this (??)   Soap Hives, Itching and Swelling    PUREX LAUNDRY DETERGENT   Tomato Hives and Itching   Banana Itching and Nausea Only   Chocolate Itching   Fluticasone Other (See Comments)    Per patient, makes sneeze and gag   Hydrocodone Itching and Nausea Only   Other Itching    Acidic foods- Itching [place]- Itching in the roof of the  mouth   Adhesive [Tape] Itching and Rash    Allergic to  "leads"   Doxycycline Other (See Comments)    AFFECTS JOINTS   Liraglutide Nausea Only   Metformin And Related Nausea Only, Rash, Other (See Comments) and Diarrhea    GI UPSET, also   Red Dye Itching, Rash and Other (See Comments)    At eye doctor's office, the dilating drops- Skin breaks out, chest turned red, itching, face became swollen    Social History   Socioeconomic History   Marital status: Divorced    Spouse name: Not on file   Number of children: 2   Years of education: 14   Highest education level: Not on file  Occupational History   Occupation: UNEMPLOYED    Employer: UNEMPLOYED  Tobacco Use   Smoking status: Former    Years: 10.00    Types: Cigarettes    Quit date: 05/24/2010    Years since quitting: 10.3   Smokeless tobacco: Never  Vaping Use   Vaping Use: Never used  Substance and Sexual Activity   Alcohol use: Yes    Alcohol/week: 0.0 standard drinks    Comment: socially   Drug use: No   Sexual activity: Yes    Birth control/protection: None    Comment: Lives w/a partner  Other Topics Concern   Not on file  Social History Narrative   Single, but lives with a partner   Has 2 children and cares for an infant during the day         Social Determinants of Health   Financial Resource Strain: Not on file  Food Insecurity: Not on file  Transportation Needs: Not on file  Physical  Activity: Not on file  Stress: Not on file  Social Connections: Not on file  Intimate Partner Violence: Not on file    Family History  Problem Relation Age of Onset   Other Mother    Leukemia Cousin    Other Other    Obesity Other    Other Other    Diabetes Maternal Aunt    Heart attack Maternal Aunt    Alzheimer's disease Maternal Uncle    Cancer Maternal Uncle        prostate   Diabetes Paternal Aunt    Cancer Paternal Aunt        brain,mouth   Alzheimer's disease Paternal Aunt    Diabetes Paternal  Uncle    Diabetes Maternal Grandmother    Diabetes Maternal Grandfather     BP 126/67   Ht _0  (1.575 m)   Wt (!) 303 lb (137.4 kg)   BMI 55.42 kg/m   No flowsheet data found.  No flowsheet data found.  Review of Systems: See HPI above.     Objective:  Physical Exam:  Gen: NAD, comfortable in exam room  Right hand: No gross deformity, swelling, bruising.  No nodule A1 pulley 4th digit. Minimal tenderness over A1 pulley 4th digit. FROM MCP, PIP, DIP 4th digit without pain. Collateral ligaments intact. NVI distally.   Assessment & Plan:  1.  Right fourth digit trigger finger: Improved with band-aid splinting of PIP joint.  Continue for 4 more weeks.  Aleve if needed.  F/u prn.

## 2020-10-30 NOTE — Progress Notes (Signed)
[facility] Note  10/31/2020     CHIEF COMPLAINT Patient presents for Retina Follow Up   HISTORY OF PRESENT ILLNESS: Ana Long is a 45 y.o. female who presents to the clinic today for:   HPI     Retina Follow Up   Patient presents with  Diabetic Retinopathy.  In both eyes.  Duration of 6 weeks.  Since onset it is stable.  I, the attending physician,  performed the HPI with the patient and updated documentation appropriately.        Comments   6 week follow up NPDR OU-  Doing well.  Vision appears stable OU. BS 207 this morning, forgot to take her insulin before she fell asleep  A1C 10.1       Last edited by Bernarda Caffey, MD on 10/31/2020 12:25 PM.    Pt states distance vision is blurry, but she has a new rx for glasses, but she hasn't gotten it filled yet   Referring physician: [name] MD [place], [place] [address]  HISTORICAL INFORMATION:   Selected notes from the [facility] Re-referred by Dr. [name] for DM exam   CURRENT MEDICATIONS: Current Outpatient Medications (Ophthalmic Drugs)  Medication Sig   Olopatadine HCl 0.2 % SOLN Place one drop into both eyes daily.   No current facility-administered medications for this visit. (Ophthalmic Drugs)   Current Outpatient Medications (Other)  Medication Sig   acyclovir (ZOVIRAX) 400 MG tablet TAKE 1 TABLET BY MOUTH TWICE A DAY   albuterol (PROAIR HFA) 108 (90 Base) MCG/ACT inhaler Inhale 2 puffs into the lungs every 4 (four) hours as needed for wheezing or shortness of breath (cough). (Patient taking differently: Inhale 2 puffs into the lungs every 4 (four) hours as needed for wheezing or shortness of breath (or coughing).)   atorvastatin (LIPITOR) 10 MG tablet Take 10 mg by mouth daily.   atorvastatin (LIPITOR) 40 MG tablet Take 1 tablet (40 mg total) by mouth at bedtime.   dapagliflozin propanediol (FARXIGA) 5 MG TABS tablet Take 5 mg by mouth  daily. (Patient taking differently: Take 5 mg by mouth 2 (two) times a day.)   DERMA-SMOOTHE/FS SCALP 0.01 % OIL Apply topically.   fluconazole (DIFLUCAN) 150 MG tablet TAKE 1 TABLET BY MOUTH NOW. THEN REPEAT IN 1 WEEK   FLUoxetine (PROZAC) 20 MG capsule Take by mouth.   furosemide (LASIX) 20 MG tablet Take 20 mg by mouth daily as needed for fluid or edema.    HUMULIN R U-500 KWIKPEN 500 UNIT/ML kwikpen Inject into the skin.   hydrochlorothiazide (HYDRODIURIL) 12.5 MG tablet Take by mouth.   insulin aspart (NOVOLOG) 100 UNIT/ML injection Inject 0-20 Units into the skin every 4 (four) hours. CBG 70 - 120: 0 units  CBG 121 - 150: 3 units  CBG 151 - 200: 4 units  CBG 201 - 250: 7 units  CBG 251 - 300: 11 units  CBG 301 - 350: 15 units  CBG 351 - 400: 20 units  CBG > 400 call MD (Patient taking differently: Inject 20 Units into the skin every 4 (four) hours. CBG 70 - 120: 0 units  CBG 121 - 150: 3 units  CBG 151 - 200: 4 units  CBG 201 - 250: 7 units  CBG 251 - 300: 11 units  CBG 301 - 350: 15 units  CBG 351 - 400: 20 units  CBG > 400 call MD)  insulin aspart protamine - aspart (NOVOLOG MIX 70/30 FLEXPEN) (70-30) 100 UNIT/ML FlexPen Inject into the skin.   JANUVIA 100 MG tablet TAKE 1 TABLET BY MOUTH EVERY DAY   lactulose (CEPHULAC) 10 g packet Take 1 packet (10 g total) by mouth daily as needed. Reported on 07/29/2015 (Patient taking differently: Take 10 g by mouth daily as needed (for constipation).)   loratadine (CLARITIN) 10 MG tablet Take 1 tablet (10 mg total) by mouth 2 (two) times daily.   methocarbamol (ROBAXIN) 500 MG tablet Take 1 tablet (500 mg total) by mouth every 8 (eight) hours as needed. (Patient taking differently: Take 500 mg by mouth every 8 (eight) hours as needed for muscle spasms.)   mometasone (NASONEX) 50 MCG/ACT nasal spray Place into the nose.   mometasone-formoterol (DULERA) 200-5 MCG/ACT AERO Inhale 2 puffs into the lungs 2 (two) times daily.   montelukast  (SINGULAIR) 10 MG tablet Take 1 tablet (10 mg total) by mouth at bedtime.   Naltrexone-buPROPion HCl ER (CONTRAVE) 8-90 MG TB12 Take by mouth.   nitroGLYCERIN (NITRODUR - DOSED IN MG/24 HR) 0.2 mg/hr patch Apply 1/4th patch to affected achilles, change daily   omeprazole (PRILOSEC) 20 MG capsule Take 1 capsule (20 mg total) by mouth daily before breakfast.   SPIRIVA RESPIMAT 1.25 MCG/ACT AERS SMARTSIG:2 Puff(s) Via Inhaler Daily   topiramate (TOPAMAX) 25 MG tablet Take by mouth.   benzonatate (TESSALON) 200 MG capsule Take 1 capsule (200 mg total) by mouth 3 (three) times daily as needed for cough. (Patient not taking: No sig reported)   Blood Glucose Monitoring Suppl (ACCU-CHEK AVIVA PLUS) w/Device KIT Please check you blood sugars 4 times a day   glucose blood (ACCU-CHEK SMARTVIEW) test strip TEST FOUR TIMES DAILY   Insulin Glargine (LANTUS) 100 UNIT/ML Solostar Pen Inject 80 Units into the skin 2 (two) times daily.   Insulin Pen Needle (B-D UF III MINI PEN NEEDLES) 31G X 5 MM MISC USE AS DIRECTED TO INJECT INSULIN FIVE A DAY E11.65   Lancets (ACCU-CHEK SOFT TOUCH) lancets Use as instructed   Current Facility-Administered Medications (Other)  Medication Route   diphenhydrAMINE (BENADRYL) injection 12.5 mg Intramuscular    REVIEW OF SYSTEMS: ROS   Positive for: Gastrointestinal, Endocrine, Eyes, Respiratory Negative for: Constitutional, Neurological, Skin, Genitourinary, Musculoskeletal, HENT, Cardiovascular, Psychiatric, Allergic/Imm, Heme/Lymph Last edited by Leonie Douglas, COA on 10/31/2020  9:23 AM.       ALLERGIES Allergies  Allergen Reactions   Bee Venom Anaphylaxis and Swelling   Wasp Venom Anaphylaxis and Swelling   Citrus Hives and Itching   Latex Hives and Swelling   Penicillins Hives and Itching    Has patient had a PCN reaction causing immediate rash, facial/tongue/throat swelling, SOB or lightheadedness with hypotension: Yes Has patient had a PCN reaction causing  severe rash involving mucus membranes or skin necrosis: Unk Has patient had a PCN reaction that required hospitalization: Was already in hosp Has patient had a PCN reaction occurring within the last 10 years: No If all of the above answers are "NO", then may proceed with Cephalosporin use.    Shellfish Allergy Other (See Comments) and Swelling    Patient is uncertain; stated that she tolerates this (??)   Soap Hives, Itching and Swelling    PUREX LAUNDRY DETERGENT   Tomato Hives and Itching   Banana Itching and Nausea Only   Chocolate Itching   Fluticasone Other (See Comments)    Per patient, makes sneeze and gag  Hydrocodone Itching and Nausea Only   Other Itching    Acidic foods- Itching [place]- Itching in the roof of the mouth   Adhesive [Tape] Itching and Rash    Allergic to  "leads"   Doxycycline Other (See Comments)    AFFECTS JOINTS   Liraglutide Nausea Only   Metformin And Related Nausea Only, Rash, Other (See Comments) and Diarrhea    GI UPSET, also   Red Dye Itching, Rash and Other (See Comments)    At eye doctor's office, the dilating drops- Skin breaks out, chest turned red, itching, face became swollen    PAST MEDICAL HISTORY 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   Cataract    NS OU   Complication of anesthesia    states is hard to wake up post-op   Constipation 12/30/2014   Diabetes mellitus    intolerant to Metformin   Diabetic retinopathy ([place])    NPDR OU   Dry skin    hands    Family history of anesthesia complication    mother went into coma during c-section   Genital herpes 01/01/2006   on acyclovir BID    GERD (gastroesophageal reflux disease)    Herpes genital    Hyperglycemia 12/26/2017    Hypertensive retinopathy    OU   Low back pain 11/22/2012   Lower extremity edema 10/18/2012   Menorrhagia 11/06/2015   Morbid obesity with BMI of 50 to 60 07/23/2009   Obesity hypoventilation syndrome ([place]) 10/30/2012   Obesity, morbid ([place]) 01/10/2014   OSA on CPAP     AHI was on  11-17-12 to 120/hr, titrated to 15 cm with 3 cm EPR/    PCOS (polycystic ovarian syndrome)    Rheumatoid arthritis (HCC)    Right ankle pain 06/19/2015   Right hip pain 09/04/2017   Right shoulder pain 06/01/2017   Seasonal allergies    Sleep apnea, obstructive    uses CPAP nightly - had sleep study 08/22/2007   Past Surgical History:  Procedure Laterality Date   CARPAL TUNNEL RELEASE Right 10/02/2012   Procedure: RIGHT CARPAL TUNNEL RELEASE ENDOSCOPIC;  Surgeon: Jolyn Nap, MD;  Location: [facility];  Service: Orthopedics;  Laterality: Right;   CHOLECYSTECTOMY N/A 12/02/2014   Procedure: LAPAROSCOPIC CHOLECYSTECTOMY;  Surgeon: Armandina Gemma, MD;  Location: [facility];  Service: General;  Laterality: N/A;   HYSTEROSCOPY WITH D & C  01-08-2002    FAMILY HISTORY Family History  Problem Relation Age of Onset   Other Mother    Leukemia Cousin    Other Other    Obesity Other    Other Other    Diabetes Maternal Aunt    Heart attack Maternal Aunt    Alzheimer's disease Maternal Uncle    Cancer Maternal Uncle        prostate   Diabetes Paternal Aunt    Cancer Paternal Aunt        brain,mouth   Alzheimer's disease Paternal Aunt    Diabetes Paternal Uncle    Diabetes Maternal Grandmother    Diabetes Maternal Grandfather     SOCIAL HISTORY Social History   Tobacco Use   Smoking status: Former    Years: 10.00  Types: Cigarettes    Quit date: 05/24/2010    Years since quitting: 10.4   Smokeless tobacco: Never  Vaping Use   Vaping Use: Never used  Substance Use Topics   Alcohol use: Yes    Alcohol/week: 0.0 standard drinks    Comment: socially   Drug use: No         OPHTHALMIC  EXAM:  Base Eye Exam     Visual Acuity (Snellen - Linear)       Right Left   Dist cc 20/40- 20/30-   Dist ph cc 20/25 20/30+    Correction: Glasses         Tonometry (Tonopen, 9:29 AM)       Right Left   Pressure 15 15         Pupils       Dark Light Shape React APD   Right 3 2 Round Brisk None   Left 3 2 Round Brisk None         Visual Fields (Counting fingers)       Left Right    Full Full         Extraocular Movement       Right Left    Full Full         Neuro/Psych     Oriented x3: Yes   Mood/Affect: Normal         Dilation     Both eyes: 1.0% Mydriacyl, 2.5% Phenylephrine @ 9:29 AM           Slit Lamp and Fundus Exam     Slit Lamp Exam       Right Left   Lids/Lashes Dermatochalasis - upper lid, mild Meibomian gland dysfunction Dermatochalasis - upper lid, mild Meibomian gland dysfunction   Conjunctiva/Sclera White and quiet White and quiet   Cornea 1-2+Punctate epithelial erosions 2-3+fine Punctate epithelial erosions   Anterior Chamber deep and quiet deep and quiet   Iris round and dilated, No NVI round and dilated   Lens 1+ NS, 1+CC 1+NS, 1+CC   Vitreous mild vitreous syneresis mild vitreous syneresis         Fundus Exam       Right Left   Disc Pink and sharp, +cupping Pink and sharp, +cupping   C/D Ratio 0.7 0.6   Macula good foveal reflex, focal exudates inferior to fovea and scattered superior macula, scattered MA, focal cystic changes IN macula - improved, focal laser changes infeiror macula good foveal reflex, scatttered MA's and exudate, central cystic changes -- improved, focal cluster of exudates temporal macula, improved   Vessels attenuated, Tortuous Vascular attenuation, Tortuous, Copper wiring   Periphery Attached, scattered MA/exudates greatest nasally Attached, scattered MA and exudate greatest superior to disc, white without pressure temporally           Refraction     Wearing Rx       Sphere  Cylinder Axis   Right -2.50 +1.50 013   Left -2.25 Sphere     Type: SVL            IMAGING AND PROCEDURES  Imaging and Procedures for @TODAY @  OCT, Retina - OU - Both Eyes       Right Eye Quality was good. Central Foveal Thickness: 230. Progression has improved. Findings include no SRF, intraretinal fluid, intraretinal hyper-reflective material, vitreomacular adhesion , abnormal foveal contour (Interval improvement in IRF/central cystic changes/foveal contour).   Left Eye Quality was good. Central Foveal Thickness: 230. Progression  has improved. Findings include intraretinal fluid, no SRF, abnormal foveal contour, intraretinal hyper-reflective material (Mild interval improvement in central cyst and foveal contour ).   Notes *Images captured and stored on drive  Diagnosis / Impression:  +DME OU-- persistent IRF/IRHM OD: Interval improvement in IRF/cystic changes/foveal contour OS: Mild interval improvement in central cyst and foveal contour   Clinical management:  See below  Abbreviations: NFP - Normal foveal profile. CME - cystoid macular edema. PED - pigment epithelial detachment. IRF - intraretinal fluid. SRF - subretinal fluid. EZ - ellipsoid zone. ERM - epiretinal membrane. ORA - outer retinal atrophy. ORT - outer retinal tubulation. SRHM - subretinal hyper-reflective material      Intravitreal Injection, Pharmacologic Agent - OD - Right Eye       Time Out 10/31/2020. 10:32 AM. Confirmed correct patient, procedure, site, and patient consented.   Anesthesia Topical anesthesia was used. Anesthetic medications included Lidocaine 2%, Proparacaine 0.5%.   Procedure Preparation included 5% betadine to ocular surface, eyelid speculum. A supplied (32g) needle was used.   Injection: 1.25 mg Bevacizumab 1.83m/0.05ml   Route: Intravitreal, Site: Right Eye   NDC:: 11941-740-81 Lot: [id number] , Expiration date: 11/19/2020, Waste: 0 mL   Post-op Post injection exam  found visual acuity of at least counting fingers. The patient tolerated the procedure well. There were no complications. The patient received written and verbal post procedure care education. Post injection medications were not given.            ASSESSMENT/PLAN:    ICD-10-CM   1. Moderate nonproliferative diabetic retinopathy of both eyes with macular edema associated with type 2 diabetes mellitus (HCC)  E10/11/2022Intravitreal Injection, Pharmacologic Agent - OD - Right Eye    Bevacizumab (AVASTIN) SOLN 1.25 mg    2. Retinal edema  H35.81 OCT, Retina - OU - Both Eyes    3. Essential hypertension  I10     4. Hypertensive retinopathy of both eyes  H35.033     5. Nuclear sclerosis of both eyes  H25.13        1,2. Moderate non-proliferative diabetic retinopathy w/ DME, OU  - delayed f/u from 4 wks to 3.5 mos (07.02.21 to 10.15.21) -- discussed importance of regular follow up and treatments  - pt lost to f/u from 10.23.2019 to 11.18.20  - FA in 2019 showed leaking MA OU; no NV OU  **of note, pt had pruritic rxn to fluorescein dye -- resolved with 12.5 mg IV benadryl**  - FA 12.16.2020 shows late leaking MA's, non-perfusion defects; no NV OU  - s/p IVA OD #1 (12.16.20), #2 (01.25.21), #3 (05.26.21), #4 (07.02.21), #5 (10.15.21), #6 (11.23.21), #7 (07.27.22)  - s/p focal laser OD (03.19.21), (04.01.22)  - s/p IVA OS #1 OS (12.23.20), #2 (01.27.21), #3 (03.02.21), #4 (05.26.21), #5 (07.02.21), #6 (10.15.21), #7 (11.23.21), #8 (01.11.22), #9 (03.04.22), #10 (4.27.22)  - exam shows scattered MA, IRH, exudates and mild macular edema OU  - OCT shows OD: Interval improvement in IRF,/cystic changes/foveal contour; OS: Mild interval improvement in central cyst and foveal contour at 6 wks  - BCVA OD: 20/25; OS: 20/30 -- mild decrease in vision OS             - Recommend IVA OD #8 today (09.09.22) w/ ext of f/u to 8 wks  - pt wishes to proceed with IVA - RBA of procedure discussed, questions  answered - see procedure note   - Avastin informed consent form signed and scanned on 01.27.2021  -  f/u 8 weeks, sooner prn -- DFE, OCT, possible injections  3,4. Hypertensive retinopathy OU  - discussed importance of tight BP control  - monitor   5. Nuclear Sclerosis   - The symptoms of cataract, surgical options, and treatments and risks were discussed with patient.  - discussed diagnosis and progression  - under the expert management of Grace Ordered this visit:  Meds ordered this encounter  Medications   Bevacizumab (AVASTIN) SOLN 1.25 mg      Return in about 8 weeks (around 12/26/2020) for f/u NPDR OU, DFE, OCT.  There are no Patient Instructions on file for this visit.  Explained the diagnoses, plan, and follow up with the patient and they expressed understanding.  Patient expressed understanding of the importance of proper follow up care.  This document serves as a record of services personally performed by Gardiner Sleeper, MD, PhD. It was created on their behalf by Leonie Douglas, an ophthalmic technician. The creation of this record is the provider's dictation and/or activities during the visit.    Electronically signed by: Leonie Douglas COA, 10/31/20  12:28 PM  This document serves as a record of services personally performed by Gardiner Sleeper, MD, PhD. It was created on their behalf by San Jetty. Owens Shark, OA an ophthalmic technician. The creation of this record is the provider's dictation and/or activities during the visit.    Electronically signed by: San Jetty. Marguerita Merles 09.09.2022 12:28 PM  Gardiner Sleeper, M.D., Ph.D. Diseases & Surgery of the Retina and [place] 10/31/2020  I have reviewed the above documentation for accuracy and completeness, and I agree with the above. Gardiner Sleeper, M.D., Ph.D. 10/31/20 12:28 PM  Abbreviations: M myopia (nearsighted); A astigmatism; H hyperopia (farsighted); P  presbyopia; Mrx spectacle prescription;  CTL contact lenses; OD right eye; OS left eye; OU both eyes  XT exotropia; ET esotropia; PEK punctate epithelial keratitis; PEE punctate epithelial erosions; DES dry eye syndrome; MGD meibomian gland dysfunction; ATs artificial tears; PFAT's preservative free artificial tears; [place] nuclear sclerotic cataract; PSC posterior subcapsular cataract; ERM epi-retinal membrane; PVD posterior vitreous detachment; RD retinal detachment; DM diabetes mellitus; DR diabetic retinopathy; NPDR non-proliferative diabetic retinopathy; PDR proliferative diabetic retinopathy; CSME clinically significant macular edema; DME diabetic macular edema; dbh dot blot hemorrhages; CWS cotton wool spot; POAG primary open angle glaucoma; C/D cup-to-disc ratio; HVF humphrey visual field; GVF goldmann visual field; OCT optical coherence tomography; IOP intraocular pressure; BRVO Branch retinal vein occlusion; CRVO central retinal vein occlusion; CRAO central retinal artery occlusion; BRAO branch retinal artery occlusion; RT retinal tear; SB scleral buckle; PPV pars plana vitrectomy; VH Vitreous hemorrhage; PRP panretinal laser photocoagulation; IVK intravitreal kenalog; VMT vitreomacular traction; MH Macular hole;  NVD neovascularization of the disc; NVE neovascularization elsewhere; AREDS age related eye disease study; ARMD age related macular degeneration; POAG primary open angle glaucoma; EBMD epithelial/anterior basement membrane dystrophy; ACIOL anterior chamber intraocular lens; IOL intraocular lens; PCIOL posterior chamber intraocular lens; Phaco/IOL phacoemulsification with intraocular lens placement; [place] photorefractive keratectomy; LASIK laser assisted in situ keratomileusis; HTN hypertension; DM diabetes mellitus; COPD chronic obstructive pulmonary disease

## 2020-10-31 ENCOUNTER — Other Ambulatory Visit: Payer: Self-pay

## 2020-10-31 ENCOUNTER — Encounter (INDEPENDENT_AMBULATORY_CARE_PROVIDER_SITE_OTHER): Payer: Self-pay | Admitting: Ophthalmology

## 2020-10-31 ENCOUNTER — Ambulatory Visit (INDEPENDENT_AMBULATORY_CARE_PROVIDER_SITE_OTHER): Payer: Medicaid Other | Admitting: Ophthalmology

## 2020-10-31 ENCOUNTER — Encounter (INDEPENDENT_AMBULATORY_CARE_PROVIDER_SITE_OTHER): Payer: Medicaid Other | Admitting: Ophthalmology

## 2020-10-31 DIAGNOSIS — E113313 Type 2 diabetes mellitus with moderate nonproliferative diabetic retinopathy with macular edema, bilateral: Secondary | ICD-10-CM

## 2020-10-31 DIAGNOSIS — I10 Essential (primary) hypertension: Secondary | ICD-10-CM

## 2020-10-31 DIAGNOSIS — H35033 Hypertensive retinopathy, bilateral: Secondary | ICD-10-CM

## 2020-10-31 DIAGNOSIS — H2513 Age-related nuclear cataract, bilateral: Secondary | ICD-10-CM

## 2020-10-31 DIAGNOSIS — H3581 Retinal edema: Secondary | ICD-10-CM | POA: Diagnosis not present

## 2020-10-31 MED ORDER — BEVACIZUMAB CHEMO INJECTION 1.25MG/0.05ML SYRINGE FOR KALEIDOSCOPE
1.2500 mg | INTRAVITREAL | Status: AC | PRN
  Administered 2020-10-31: 1.25 mg via INTRAVITREAL

## 2020-12-12 ENCOUNTER — Encounter: Payer: Self-pay | Admitting: Gastroenterology

## 2020-12-15 ENCOUNTER — Ambulatory Visit (INDEPENDENT_AMBULATORY_CARE_PROVIDER_SITE_OTHER): Payer: Medicaid Other | Admitting: Family Medicine

## 2020-12-15 VITALS — BP 143/75 | Ht 62.0 in | Wt 303.0 lb

## 2020-12-15 DIAGNOSIS — M6530 Trigger finger, unspecified finger: Secondary | ICD-10-CM | POA: Diagnosis not present

## 2020-12-15 DIAGNOSIS — M25511 Pain in right shoulder: Secondary | ICD-10-CM

## 2020-12-15 NOTE — Patient Instructions (Signed)
You acutely strained your right rotator cuff. The prednisone will help so start taking this today when you pick it up. Avoid overhead motions, reaching as much as possible. Icing 15 minutes at a time 3-4 times a day. Continue with the splinting for your trigger finger. If over the next week you are not improving (in one or both of these) follow up with me and we will go ahead with injection(s).

## 2020-12-16 ENCOUNTER — Encounter: Payer: Self-pay | Admitting: Family Medicine

## 2020-12-16 NOTE — Progress Notes (Signed)
PCP: Willeen Niece, PA  Subjective:   HPI: Patient is a 45 y.o. female here for right shoulder pain.  Patient reports she was doing a lot of lifting and without an acute injury over the past day developed worsening right shoulder pain. Pain lateral, worse with overhead motions and reaching. Has known high grade partial thickness tear (discovered 2 years ago) of supraspinatus that was treated conservatively and improved (diabetes was under poor control so not a surgical candidate at the time). Pain worse with sleeping, lying on her side. Taking ibuprofen. Also reports pain worsening from her right ring trigger finger. Continuing to do the band aid splinting here.  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   Cataract    NS OU   Complication of anesthesia    states is hard to wake up post-op   Constipation 12/30/2014   Diabetes mellitus    intolerant to Metformin   Diabetic retinopathy ([place])    NPDR OU   Dry skin    hands    Family history of anesthesia complication    mother went into coma during c-section   Genital herpes 01/01/2006   on acyclovir BID    GERD (gastroesophageal reflux disease)    Herpes genital    Hyperglycemia 12/26/2017   Hypertensive retinopathy    OU   Low back pain 11/22/2012   Lower extremity edema 10/18/2012   Menorrhagia 11/06/2015   Morbid obesity with BMI of 50 to 60 07/23/2009   Obesity hypoventilation syndrome ([place]) 10/30/2012   Obesity, morbid ([place]) 01/10/2014   OSA on CPAP     AHI was on  11-17-12 to 120/hr, titrated to 15 cm with 3 cm EPR/    PCOS (polycystic ovarian syndrome)    Rheumatoid arthritis ([place])    Right ankle pain 06/19/2015   Right hip pain 09/04/2017   Right shoulder pain 06/01/2017    Seasonal allergies    Sleep apnea, obstructive    uses CPAP nightly - had sleep study 08/22/2007    Current Outpatient Medications on File Prior to Visit  Medication Sig Dispense Refill   acyclovir (ZOVIRAX) 400 MG tablet TAKE 1 TABLET BY MOUTH TWICE A DAY 60 tablet 2   albuterol (PROAIR HFA) 108 (90 Base) MCG/ACT inhaler Inhale 2 puffs into the lungs every 4 (four) hours as needed for wheezing or shortness of breath (cough). (Patient taking differently: Inhale 2 puffs into the lungs every 4 (four) hours as needed for wheezing or shortness of breath (or coughing).) 6.7 g 2   atorvastatin (LIPITOR) 10 MG tablet Take 10 mg by mouth daily.     atorvastatin (LIPITOR) 40 MG tablet Take 1 tablet (40 mg total) by mouth at bedtime. 90 tablet 1   benzonatate (TESSALON) 200 MG capsule Take 1 capsule (200 mg total) by mouth 3 (three) times daily as needed for cough. (Patient not taking: No sig reported) 20 capsule 0   Blood Glucose Monitoring Suppl (ACCU-CHEK AVIVA PLUS) w/Device KIT Please check you blood sugars 4 times a day 1 kit 0   dapagliflozin propanediol (FARXIGA) 5 MG TABS tablet Take 5 mg by mouth daily. (Patient taking differently: Take 5 mg by mouth 2 (two) times a day.) 90 tablet  1   DERMA-SMOOTHE/FS SCALP 0.01 % OIL Apply topically.     fluconazole (DIFLUCAN) 150 MG tablet TAKE 1 TABLET BY MOUTH NOW. THEN REPEAT IN 1 WEEK     FLUoxetine (PROZAC) 20 MG capsule Take by mouth.     furosemide (LASIX) 20 MG tablet Take 20 mg by mouth daily as needed for fluid or edema.      glucose blood (ACCU-CHEK SMARTVIEW) test strip TEST FOUR TIMES DAILY 150 each 6   HUMULIN R U-500 KWIKPEN 500 UNIT/ML kwikpen Inject into the skin.     hydrochlorothiazide (HYDRODIURIL) 12.5 MG tablet Take by mouth.     insulin aspart (NOVOLOG) 100 UNIT/ML injection Inject 0-20 Units into the skin every 4 (four) hours. CBG 70 - 120: 0 units  CBG 121 - 150: 3 units  CBG 151 - 200: 4 units  CBG 201 - 250: 7 units  CBG 251 -  300: 11 units  CBG 301 - 350: 15 units  CBG 351 - 400: 20 units  CBG > 400 call MD (Patient taking differently: Inject 20 Units into the skin every 4 (four) hours. CBG 70 - 120: 0 units  CBG 121 - 150: 3 units  CBG 151 - 200: 4 units  CBG 201 - 250: 7 units  CBG 251 - 300: 11 units  CBG 301 - 350: 15 units  CBG 351 - 400: 20 units  CBG > 400 call MD) 10 mL 11   insulin aspart protamine - aspart (NOVOLOG MIX 70/30 FLEXPEN) (70-30) 100 UNIT/ML FlexPen Inject into the skin.     Insulin Glargine (LANTUS) 100 UNIT/ML Solostar Pen Inject 80 Units into the skin 2 (two) times daily. 15 pen 0   Insulin Pen Needle (B-D UF III MINI PEN NEEDLES) 31G X 5 MM MISC USE AS DIRECTED TO INJECT INSULIN FIVE A DAY E11.65 100 each 6   JANUVIA 100 MG tablet TAKE 1 TABLET BY MOUTH EVERY DAY 90 tablet 3   lactulose (CEPHULAC) 10 g packet Take 1 packet (10 g total) by mouth daily as needed. Reported on 07/29/2015 (Patient taking differently: Take 10 g by mouth daily as needed (for constipation).) 30 each 0   Lancets (ACCU-CHEK SOFT TOUCH) lancets Use as instructed 100 each 12   loratadine (CLARITIN) 10 MG tablet Take 1 tablet (10 mg total) by mouth 2 (two) times daily. 180 tablet 1   methocarbamol (ROBAXIN) 500 MG tablet Take 1 tablet (500 mg total) by mouth every 8 (eight) hours as needed. (Patient taking differently: Take 500 mg by mouth every 8 (eight) hours as needed for muscle spasms.) 60 tablet 1   mometasone-formoterol (DULERA) 200-5 MCG/ACT AERO Inhale 2 puffs into the lungs 2 (two) times daily. 13 Inhaler 1   montelukast (SINGULAIR) 10 MG tablet Take 1 tablet (10 mg total) by mouth at bedtime. 90 tablet 1   Naltrexone-buPROPion HCl ER (CONTRAVE) 8-90 MG TB12 Take by mouth.     nitroGLYCERIN (NITRODUR - DOSED IN MG/24 HR) 0.2 mg/hr patch Apply 1/4th patch to affected achilles, change daily 30 patch 1   Olopatadine HCl 0.2 % SOLN Place one drop into both eyes daily.     omeprazole (PRILOSEC) 20 MG capsule Take 1  capsule (20 mg total) by mouth daily before breakfast. 90 capsule 2   SPIRIVA RESPIMAT 1.25 MCG/ACT AERS SMARTSIG:2 Puff(s) Via Inhaler Daily     topiramate (TOPAMAX) 25 MG tablet Take by mouth.     Current Facility-Administered Medications on File  Prior to Visit  Medication Dose Route Frequency Provider Last Rate Last Admin   diphenhydrAMINE (BENADRYL) injection 12.5 mg  12.5 mg Intramuscular Once Bernarda Caffey, MD        Past Surgical History:  Procedure Laterality Date   CARPAL TUNNEL RELEASE Right 10/02/2012   Procedure: RIGHT CARPAL TUNNEL RELEASE ENDOSCOPIC;  Surgeon: Jolyn Nap, MD;  Location: ;  Service: Orthopedics;  Laterality: Right;   CHOLECYSTECTOMY N/A 12/02/2014   Procedure: LAPAROSCOPIC CHOLECYSTECTOMY;  Surgeon: Armandina Gemma, MD;  Location: [facility];  Service: General;  Laterality: N/A;   HYSTEROSCOPY WITH D & C  01-08-2002    Allergies  Allergen Reactions   Bee Venom Anaphylaxis and Swelling   Wasp Venom Anaphylaxis and Swelling   Citrus Hives and Itching   Latex Hives and Swelling   Penicillins Hives and Itching    Has patient had a PCN reaction causing immediate rash, facial/tongue/throat swelling, SOB or lightheadedness with hypotension: Yes Has patient had a PCN reaction causing severe rash involving mucus membranes or skin necrosis: Unk Has patient had a PCN reaction that required hospitalization: Was already in hosp Has patient had a PCN reaction occurring within the last 10 years: No If all of the above answers are "NO", then may proceed with Cephalosporin use.    Shellfish Allergy Other (See Comments) and Swelling    Patient is uncertain; stated that she tolerates this (??)   Soap Hives, Itching and Swelling    PUREX LAUNDRY DETERGENT   Tomato Hives and Itching   Banana Itching and Nausea Only   Chocolate Itching   Fluticasone Other (See Comments)    Per patient, makes sneeze and gag   Hydrocodone Itching and Nausea Only    Other Itching    Acidic foods- Itching [place]- Itching in the roof of the mouth   Adhesive [Tape] Itching and Rash    Allergic to  "leads"   Doxycycline Other (See Comments)    AFFECTS JOINTS   Liraglutide Nausea Only   Metformin And Related Nausea Only, Rash, Other (See Comments) and Diarrhea    GI UPSET, also   Red Dye Itching, Rash and Other (See Comments)    At eye doctor's office, the dilating drops- Skin breaks out, chest turned red, itching, face became swollen    BP (!) 143/75   Ht 5' 2"  (1.575 m)   Wt (!) 303 lb (137.4 kg)   BMI 55.42 kg/m   No flowsheet data found.  No flowsheet data found.      Objective:  Physical Exam:  Gen: NAD, comfortable in exam room  Right shoulder: No swelling, ecchymoses.  No gross deformity. No TTP. Full passive ROM.  Active ROM limited to 90 degrees flexion, abduction limited by pain. Positive Hawkins, Neers. Negative Yergasons. Strength 5/5 with empty can and resisted internal/external rotation.  Pain empty can, ER. NV intact distally.  Right hand: No deformity. Decreased flexion of 4th digit.  5/5 strength flexion and extension at PIP, DIP joints. Tenderness to palpation over A1 pulley 4th digit. NVI distally.   Assessment & Plan:  1. Right shoulder pain - present for 1 day.  Consistent with acute strain of rotator cuff.  Given prednisone for her asthma today but she hasn't started taking - advised this should help.  Avoid overhead motions, reaching.  Icing.  F/u in 1 week if not improving.  2. Right 4th digit trigger finger - continue splinting, is starting prednisone also.  Consider injection.

## 2020-12-18 NOTE — Progress Notes (Shared)
[facility] Note  12/26/2020     CHIEF COMPLAINT Patient presents for No chief complaint on file.   HISTORY OF PRESENT ILLNESS: Ana Long is a 45 y.o. female who presents to the clinic today for:    Pt states distance vision is blurry, but she has a new rx for glasses, but she hasn't gotten it filled yet   Referring physician: [name] [place], [place] [address]  HISTORICAL INFORMATION:   Selected notes from the [facility]-referred by Dr. [name] for DM exam   CURRENT MEDICATIONS: Current Outpatient Medications (Ophthalmic Drugs)  Medication Sig   Olopatadine HCl 0.2 % SOLN Place one drop into both eyes daily.   No current facility-administered medications for this visit. (Ophthalmic Drugs)   Current Outpatient Medications (Other)  Medication Sig   acyclovir (ZOVIRAX) 400 MG tablet TAKE 1 TABLET BY MOUTH TWICE A DAY   albuterol (PROAIR HFA) 108 (90 Base) MCG/ACT inhaler Inhale 2 puffs into the lungs every 4 (four) hours as needed for wheezing or shortness of breath (cough). (Patient taking differently: Inhale 2 puffs into the lungs every 4 (four) hours as needed for wheezing or shortness of breath (or coughing).)   atorvastatin (LIPITOR) 10 MG tablet Take 10 mg by mouth daily.   atorvastatin (LIPITOR) 40 MG tablet Take 1 tablet (40 mg total) by mouth at bedtime.   benzonatate (TESSALON) 200 MG capsule Take 1 capsule (200 mg total) by mouth 3 (three) times daily as needed for cough. (Patient not taking: No sig reported)   Blood Glucose Monitoring Suppl (ACCU-CHEK AVIVA PLUS) w/Device KIT Please check you blood sugars 4 times a day   dapagliflozin propanediol (FARXIGA) 5 MG TABS tablet Take 5 mg by mouth daily. (Patient taking differently: Take 5 mg by mouth 2 (two) times a day.)   DERMA-SMOOTHE/FS SCALP 0.01 % OIL Apply topically.   fluconazole (DIFLUCAN) 150 MG tablet TAKE 1 TABLET BY MOUTH NOW.  THEN REPEAT IN 1 WEEK   FLUoxetine (PROZAC) 20 MG capsule Take by mouth.   furosemide (LASIX) 20 MG tablet Take 20 mg by mouth daily as needed for fluid or edema.    glucose blood (ACCU-CHEK SMARTVIEW) test strip TEST FOUR TIMES DAILY   HUMULIN R U-500 KWIKPEN 500 UNIT/ML kwikpen Inject into the skin.   hydrochlorothiazide (HYDRODIURIL) 12.5 MG tablet Take by mouth.   insulin aspart (NOVOLOG) 100 UNIT/ML injection Inject 0-20 Units into the skin every 4 (four) hours. CBG 70 - 120: 0 units  CBG 121 - 150: 3 units  CBG 151 - 200: 4 units  CBG 201 - 250: 7 units  CBG 251 - 300: 11 units  CBG 301 - 350: 15 units  CBG 351 - 400: 20 units  CBG > 400 call MD (Patient taking differently: Inject 20 Units into the skin every 4 (four) hours. CBG 70 - 120: 0 units  CBG 121 - 150: 3 units  CBG 151 - 200: 4 units  CBG 201 - 250: 7 units  CBG 251 - 300: 11 units  CBG 301 - 350: 15 units  CBG 351 - 400: 20 units  CBG > 400 call MD)   insulin aspart protamine - aspart (NOVOLOG MIX 70/30 FLEXPEN) (70-30) 100 UNIT/ML FlexPen Inject into the skin.   Insulin Glargine (LANTUS) 100 UNIT/ML Solostar Pen Inject 80 Units into the skin 2 (two) times daily.   Insulin Pen Needle (  B-D UF III MINI PEN NEEDLES) 31G X 5 MM MISC USE AS DIRECTED TO INJECT INSULIN FIVE A DAY E11.65   JANUVIA 100 MG tablet TAKE 1 TABLET BY MOUTH EVERY DAY   lactulose (CEPHULAC) 10 g packet Take 1 packet (10 g total) by mouth daily as needed. Reported on 07/29/2015 (Patient taking differently: Take 10 g by mouth daily as needed (for constipation).)   Lancets (ACCU-CHEK SOFT TOUCH) lancets Use as instructed   loratadine (CLARITIN) 10 MG tablet Take 1 tablet (10 mg total) by mouth 2 (two) times daily.   methocarbamol (ROBAXIN) 500 MG tablet Take 1 tablet (500 mg total) by mouth every 8 (eight) hours as needed. (Patient taking differently: Take 500 mg by mouth every 8 (eight) hours as needed for muscle spasms.)   mometasone-formoterol (DULERA)  200-5 MCG/ACT AERO Inhale 2 puffs into the lungs 2 (two) times daily.   montelukast (SINGULAIR) 10 MG tablet Take 1 tablet (10 mg total) by mouth at bedtime.   Naltrexone-buPROPion HCl ER (CONTRAVE) 8-90 MG TB12 Take by mouth.   nitroGLYCERIN (NITRODUR - DOSED IN MG/24 HR) 0.2 mg/hr patch Apply 1/4th patch to affected achilles, change daily   omeprazole (PRILOSEC) 20 MG capsule Take 1 capsule (20 mg total) by mouth daily before breakfast.   SPIRIVA RESPIMAT 1.25 MCG/ACT AERS SMARTSIG:2 Puff(s) Via Inhaler Daily   topiramate (TOPAMAX) 25 MG tablet Take by mouth.   Current Facility-Administered Medications (Other)  Medication Route   diphenhydrAMINE (BENADRYL) injection 12.5 mg Intramuscular    REVIEW OF SYSTEMS:     ALLERGIES Allergies  Allergen Reactions   Bee Venom Anaphylaxis and Swelling   Wasp Venom Anaphylaxis and Swelling   Citrus Hives and Itching   Latex Hives and Swelling   Penicillins Hives and Itching    Has patient had a PCN reaction causing immediate rash, facial/tongue/throat swelling, SOB or lightheadedness with hypotension: Yes Has patient had a PCN reaction causing severe rash involving mucus membranes or skin necrosis: Unk Has patient had a PCN reaction that required hospitalization: Was already in hosp Has patient had a PCN reaction occurring within the last 10 years: No If all of the above answers are "NO", then may proceed with Cephalosporin use.    Shellfish Allergy Other (See Comments) and Swelling    Patient is uncertain; stated that she tolerates this (??)   Soap Hives, Itching and Swelling    PUREX LAUNDRY DETERGENT   Tomato Hives and Itching   Banana Itching and Nausea Only   Chocolate Itching   Fluticasone Other (See Comments)    Per patient, makes sneeze and gag   Hydrocodone Itching and Nausea Only   Other Itching    Acidic foods- Itching [place]- Itching in the roof of the mouth   Adhesive [Tape] Itching and Rash    Allergic to  "leads"    Doxycycline Other (See Comments)    AFFECTS JOINTS   Liraglutide Nausea Only   Metformin And Related Nausea Only, Rash, Other (See Comments) and Diarrhea    GI UPSET, also   Red Dye Itching, Rash and Other (See Comments)    At eye doctor's office, the dilating drops- Skin breaks out, chest turned red, itching, face became swollen    PAST MEDICAL HISTORY Past Medical History:  Diagnosis Date   Acute nonintractable headache 12/26/2017   Arthritis    major joints   Asthma    prn inhaler   Asthma exacerbation 07/25/2013   Asthma in adult, severe persistent,  with acute exacerbation 01/11/2018   Carpal tunnel syndrome of right wrist 09/2012   Cataract    NS OU   Complication of anesthesia    states is hard to wake up post-op   Constipation 12/30/2014   Diabetes mellitus    intolerant to Metformin   Diabetic retinopathy ([place])    NPDR OU   Dry skin    hands    Family history of anesthesia complication    mother went into coma during c-section   Genital herpes 01/01/2006   on acyclovir BID    GERD (gastroesophageal reflux disease)    Herpes genital    Hyperglycemia 12/26/2017   Hypertensive retinopathy    OU   Low back pain 11/22/2012   Lower extremity edema 10/18/2012   Menorrhagia 11/06/2015   Morbid obesity with BMI of 50 to 60 07/23/2009   Obesity hypoventilation syndrome ([place]) 10/30/2012   Obesity, morbid ([place]) 01/10/2014   OSA on CPAP     AHI was on  11-17-12 to 120/hr, titrated to 15 cm with 3 cm EPR/    PCOS (polycystic ovarian syndrome)    Rheumatoid arthritis ([facility])    Right ankle pain 06/19/2015   Right hip pain 09/04/2017   Right shoulder pain 06/01/2017   Seasonal allergies    Sleep apnea, obstructive    uses CPAP nightly - had sleep study 08/22/2007   Past Surgical History:  Procedure Laterality Date   CARPAL TUNNEL RELEASE Right 10/02/2012   Procedure: RIGHT CARPAL TUNNEL RELEASE ENDOSCOPIC;  Surgeon: Jolyn Nap, MD;  Location: [facility];  Service:  Orthopedics;  Laterality: Right;   CHOLECYSTECTOMY N/A 12/02/2014   Procedure: LAPAROSCOPIC CHOLECYSTECTOMY;  Surgeon: Armandina Gemma, MD;  Location: [facility];  Service: General;  Laterality: N/A;   HYSTEROSCOPY WITH D & C  01-08-2002    FAMILY HISTORY Family History  Problem Relation Age of Onset   Other Mother    Leukemia Cousin    Other Other    Obesity Other    Other Other    Diabetes Maternal Aunt    Heart attack Maternal Aunt    Alzheimer's disease Maternal Uncle    Cancer Maternal Uncle        prostate   Diabetes Paternal Aunt    Cancer Paternal Aunt        brain,mouth   Alzheimer's disease Paternal Aunt    Diabetes Paternal Uncle    Diabetes Maternal Grandmother    Diabetes Maternal Grandfather     SOCIAL HISTORY Social History   Tobacco Use   Smoking status: Former    Years: 10.00    Types: Cigarettes    Quit date: 05/24/2010    Years since quitting: 10.5   Smokeless tobacco: Never  Vaping Use   Vaping Use: Never used  Substance Use Topics   Alcohol use: Yes    Alcohol/week: 0.0 standard drinks    Comment: socially   Drug use: No         OPHTHALMIC EXAM:  Not recorded     IMAGING AND PROCEDURES  Imaging and Procedures for _0 @          ASSESSMENT/PLAN:    ICD-10-CM   1. Moderate nonproliferative diabetic retinopathy of both eyes with macular edema associated with type 2 diabetes mellitus ([place])  B28.4132     2. Retinal edema  H35.81     3. Essential hypertension  I10     4. Hypertensive retinopathy of both eyes  H35.033  5. Nuclear sclerosis of both eyes  H25.13         1,2. Moderate non-proliferative diabetic retinopathy w/ DME, OU  - delayed f/u from 4 wks to 3.5 mos (07.02.21 to 10.15.21) -- discussed importance of regular follow up and treatments  - pt lost to f/u from 10.23.2019 to 11.18.20  - FA in 2019 showed leaking MA OU; no NV OU  **of note, pt had pruritic rxn to fluorescein dye -- resolved with 12.5 mg IV  benadryl**  - FA 12.16.2020 shows late leaking MA's, non-perfusion defects; no NV OU  - s/p IVA OD #1 (12.16.20), #2 (01.25.21), #3 (05.26.21), #4 (07.02.21), #5 (10.15.21), #6 (11.23.21), #7 (07.27.22), #8 (09.09.22)  - s/p focal laser OD (03.19.21), (04.01.22)  - s/p IVA OS #1 OS (12.23.20), #2 (01.27.21), #3 (03.02.21), #4 (05.26.21), #5 (07.02.21), #6 (10.15.21), #7 (11.23.21), #8 (01.11.22), #9 (03.04.22), #10 (4.27.22)  - exam shows scattered MA, IRH, exudates and mild macular edema OU  - OCT shows OD: Interval improvement in IRF,/cystic changes/foveal contour; OS: Mild interval improvement in central cyst and foveal contour at 6 wks  - BCVA OD: 20/25; OS: 20/30 -- mild decrease in vision OS             - Recommend IVA OD #9 today 11.04.22 w/ ext of f/u to 8 wks  - pt wishes to proceed with IVA - RBA of procedure discussed, questions answered - see procedure note   - [place] informed consent form signed and scanned on 01.27.2021  - f/u 8 weeks, sooner prn -- DFE, OCT, possible injections  3,4. Hypertensive retinopathy OU  - discussed importance of tight BP control  - monitor   5. Nuclear Sclerosis   - The symptoms of cataract, surgical options, and treatments and risks were discussed with patient.  - discussed diagnosis and progression  - under the expert management of [facility] Ordered this visit:  No orders of the defined types were placed in this encounter.     No follow-ups on file.  There are no Patient Instructions on file for this visit.  Explained the diagnoses, plan, and follow up with the patient and they expressed understanding.  Patient expressed understanding of the importance of proper follow up care.  This document serves as a record of services personally performed by Gardiner Sleeper, MD, PhD. It was created on their behalf by Leonie Douglas, an ophthalmic technician. The creation of this record is the provider's dictation and/or activities  during the visit.    Electronically signed by: Leonie Douglas COA, 12/18/20  12:08 PM  Gardiner Sleeper, M.D., Ph.D. Diseases & Surgery of the Retina and [place] 12/26/2020   Abbreviations: M myopia (nearsighted); A astigmatism; H hyperopia (farsighted); P presbyopia; Mrx spectacle prescription;  CTL contact lenses; OD right eye; OS left eye; OU both eyes  XT exotropia; ET esotropia; PEK punctate epithelial keratitis; PEE punctate epithelial erosions; DES dry eye syndrome; MGD meibomian gland dysfunction; ATs artificial tears; PFAT's preservative free artificial tears; [place] nuclear sclerotic cataract; PSC posterior subcapsular cataract; ERM epi-retinal membrane; PVD posterior vitreous detachment; RD retinal detachment; DM diabetes mellitus; DR diabetic retinopathy; NPDR non-proliferative diabetic retinopathy; PDR proliferative diabetic retinopathy; CSME clinically significant macular edema; DME diabetic macular edema; dbh dot blot hemorrhages; CWS cotton wool spot; POAG primary open angle glaucoma; C/D cup-to-disc ratio; HVF humphrey visual field; GVF goldmann visual field; OCT optical coherence tomography; IOP intraocular pressure; BRVO  Branch retinal vein occlusion; CRVO central retinal vein occlusion; CRAO central retinal artery occlusion; BRAO branch retinal artery occlusion; RT retinal tear; SB scleral buckle; PPV pars plana vitrectomy; VH Vitreous hemorrhage; PRP panretinal laser photocoagulation; IVK intravitreal kenalog; VMT vitreomacular traction; MH Macular hole;  NVD neovascularization of the disc; NVE neovascularization elsewhere; AREDS age related eye disease study; ARMD age related macular degeneration; POAG primary open angle glaucoma; EBMD epithelial/anterior basement membrane dystrophy; ACIOL anterior chamber intraocular lens; IOL intraocular lens; PCIOL posterior chamber intraocular lens; Phaco/IOL phacoemulsification with intraocular lens placement; [place]  photorefractive keratectomy; LASIK laser assisted in situ keratomileusis; HTN hypertension; DM diabetes mellitus; COPD chronic obstructive pulmonary disease

## 2020-12-22 ENCOUNTER — Ambulatory Visit (INDEPENDENT_AMBULATORY_CARE_PROVIDER_SITE_OTHER): Payer: Medicaid Other | Admitting: Family Medicine

## 2020-12-22 ENCOUNTER — Other Ambulatory Visit: Payer: Self-pay

## 2020-12-22 VITALS — BP 122/88 | Ht 62.0 in | Wt 303.0 lb

## 2020-12-22 DIAGNOSIS — M25511 Pain in right shoulder: Secondary | ICD-10-CM | POA: Diagnosis not present

## 2020-12-22 DIAGNOSIS — M6530 Trigger finger, unspecified finger: Secondary | ICD-10-CM

## 2020-12-22 MED ORDER — METHYLPREDNISOLONE ACETATE 40 MG/ML IJ SUSP
40.0000 mg | Freq: Once | INTRAMUSCULAR | Status: AC
  Administered 2020-12-22: 40 mg via INTRA_ARTICULAR

## 2020-12-22 MED ORDER — METHYLPREDNISOLONE ACETATE 40 MG/ML IJ SUSP
20.0000 mg | Freq: Once | INTRAMUSCULAR | Status: AC
  Administered 2020-12-22: 20 mg via INTRA_ARTICULAR

## 2020-12-22 NOTE — Patient Instructions (Signed)
Wait a few days before starting the exercises for your shoulder. Start with using the yellow theraband. Still continue with the band aid splinting of the ring finger. Follow up with me in 6 weeks. I hope you feel better!

## 2020-12-23 ENCOUNTER — Encounter: Payer: Self-pay | Admitting: Family Medicine

## 2020-12-23 NOTE — Progress Notes (Signed)
PCP: Willeen Niece, PA  Subjective:   HPI: Patient is a 45 y.o. female here for right shoulder pain, right 4th finger trigger finger.  10/24: Patient reports she was doing a lot of lifting and without an acute injury over the past day developed worsening right shoulder pain. Pain lateral, worse with overhead motions and reaching. Has known high grade partial thickness tear (discovered 2 years ago) of supraspinatus that was treated conservatively and improved (diabetes was under poor control so not a surgical candidate at the time). Pain worse with sleeping, lying on her side. Taking ibuprofen. Also reports pain worsening from her right ring trigger finger. Continuing to do the band aid splinting here.  10/31: Patient reports she continues to struggle with right shoulder and right 4th digit pain. Shoulder motion and pain have improved some but still bothering a lot. Difficulty sleeping, raising arm, lying on side. Right 4th digit is catching and locking. Prednisone she was taking for asthma didn't make much of a difference.  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   Cataract    NS OU   Complication of anesthesia    states is hard to wake up post-op   Constipation 12/30/2014   Diabetes mellitus    intolerant to Metformin   Diabetic retinopathy ([place])    NPDR OU   Dry skin    hands    Family history of anesthesia complication    mother went into coma during c-section   Genital herpes 01/01/2006   on acyclovir BID    GERD (gastroesophageal reflux disease)    Herpes genital    Hyperglycemia 12/26/2017   Hypertensive retinopathy    OU   Low back pain 11/22/2012   Lower extremity edema 10/18/2012   Menorrhagia 11/06/2015   Morbid  obesity with BMI of 50 to 60 07/23/2009   Obesity hypoventilation syndrome ([place]) 10/30/2012   Obesity, morbid ([place]) 01/10/2014   OSA on CPAP     AHI was on  11-17-12 to 120/hr, titrated to 15 cm with 3 cm EPR/    PCOS (polycystic ovarian syndrome)    Rheumatoid arthritis ([place])    Right ankle pain 06/19/2015   Right hip pain 09/04/2017   Right shoulder pain 06/01/2017   Seasonal allergies    Sleep apnea, obstructive    uses CPAP nightly - had sleep study 08/22/2007    Current Outpatient Medications on File Prior to Visit  Medication Sig Dispense Refill   acyclovir (ZOVIRAX) 400 MG tablet TAKE 1 TABLET BY MOUTH TWICE A DAY 60 tablet 2   albuterol (PROAIR HFA) 108 (90 Base) MCG/ACT inhaler Inhale 2 puffs into the lungs every 4 (four) hours as needed for wheezing or shortness of breath (cough). (Patient taking differently: Inhale 2 puffs into the lungs every 4 (four) hours as needed for wheezing or shortness of breath (or coughing).) 6.7 g 2   atorvastatin (LIPITOR) 10 MG tablet Take 10 mg by mouth daily.     atorvastatin (LIPITOR) 40 MG tablet Take 1 tablet (40 mg total) by mouth at bedtime. 90 tablet 1   benzonatate (TESSALON) 200 MG capsule Take 1 capsule (200 mg total) by mouth 3 (three) times daily as needed for cough. (Patient not  taking: No sig reported) 20 capsule 0   Blood Glucose Monitoring Suppl (ACCU-CHEK AVIVA PLUS) w/Device KIT Please check you blood sugars 4 times a day 1 kit 0   dapagliflozin propanediol (FARXIGA) 5 MG TABS tablet Take 5 mg by mouth daily. (Patient taking differently: Take 5 mg by mouth 2 (two) times a day.) 90 tablet 1   DERMA-SMOOTHE/FS SCALP 0.01 % OIL Apply topically.     fluconazole (DIFLUCAN) 150 MG tablet TAKE 1 TABLET BY MOUTH NOW. THEN REPEAT IN 1 WEEK     FLUoxetine (PROZAC) 20 MG capsule Take by mouth.     furosemide (LASIX) 20 MG tablet Take 20 mg by mouth daily as needed for fluid or edema.      glucose blood (ACCU-CHEK SMARTVIEW) test strip TEST FOUR  TIMES DAILY 150 each 6   HUMULIN R U-500 KWIKPEN 500 UNIT/ML kwikpen Inject into the skin.     hydrochlorothiazide (HYDRODIURIL) 12.5 MG tablet Take by mouth.     insulin aspart (NOVOLOG) 100 UNIT/ML injection Inject 0-20 Units into the skin every 4 (four) hours. CBG 70 - 120: 0 units  CBG 121 - 150: 3 units  CBG 151 - 200: 4 units  CBG 201 - 250: 7 units  CBG 251 - 300: 11 units  CBG 301 - 350: 15 units  CBG 351 - 400: 20 units  CBG > 400 call MD (Patient taking differently: Inject 20 Units into the skin every 4 (four) hours. CBG 70 - 120: 0 units  CBG 121 - 150: 3 units  CBG 151 - 200: 4 units  CBG 201 - 250: 7 units  CBG 251 - 300: 11 units  CBG 301 - 350: 15 units  CBG 351 - 400: 20 units  CBG > 400 call MD) 10 mL 11   insulin aspart protamine - aspart (NOVOLOG MIX 70/30 FLEXPEN) (70-30) 100 UNIT/ML FlexPen Inject into the skin.     Insulin Glargine (LANTUS) 100 UNIT/ML Solostar Pen Inject 80 Units into the skin 2 (two) times daily. 15 pen 0   Insulin Pen Needle (B-D UF III MINI PEN NEEDLES) 31G X 5 MM MISC USE AS DIRECTED TO INJECT INSULIN FIVE A DAY E11.65 100 each 6   JANUVIA 100 MG tablet TAKE 1 TABLET BY MOUTH EVERY DAY 90 tablet 3   lactulose (CEPHULAC) 10 g packet Take 1 packet (10 g total) by mouth daily as needed. Reported on 07/29/2015 (Patient taking differently: Take 10 g by mouth daily as needed (for constipation).) 30 each 0   Lancets (ACCU-CHEK SOFT TOUCH) lancets Use as instructed 100 each 12   loratadine (CLARITIN) 10 MG tablet Take 1 tablet (10 mg total) by mouth 2 (two) times daily. 180 tablet 1   methocarbamol (ROBAXIN) 500 MG tablet Take 1 tablet (500 mg total) by mouth every 8 (eight) hours as needed. (Patient taking differently: Take 500 mg by mouth every 8 (eight) hours as needed for muscle spasms.) 60 tablet 1   mometasone-formoterol (DULERA) 200-5 MCG/ACT AERO Inhale 2 puffs into the lungs 2 (two) times daily. 13 Inhaler 1   montelukast (SINGULAIR) 10 MG tablet  Take 1 tablet (10 mg total) by mouth at bedtime. 90 tablet 1   Naltrexone-buPROPion HCl ER (CONTRAVE) 8-90 MG TB12 Take by mouth.     nitroGLYCERIN (NITRODUR - DOSED IN MG/24 HR) 0.2 mg/hr patch Apply 1/4th patch to affected achilles, change daily 30 patch 1   Olopatadine HCl 0.2 % SOLN Place one drop  into both eyes daily.     omeprazole (PRILOSEC) 20 MG capsule Take 1 capsule (20 mg total) by mouth daily before breakfast. 90 capsule 2   SPIRIVA RESPIMAT 1.25 MCG/ACT AERS SMARTSIG:2 Puff(s) Via Inhaler Daily     topiramate (TOPAMAX) 25 MG tablet Take by mouth.     Current Facility-Administered Medications on File Prior to Visit  Medication Dose Route Frequency Provider Last Rate Last Admin   diphenhydrAMINE (BENADRYL) injection 12.5 mg  12.5 mg Intramuscular Once Bernarda Caffey, MD        Past Surgical History:  Procedure Laterality Date   CARPAL TUNNEL RELEASE Right 10/02/2012   Procedure: RIGHT CARPAL TUNNEL RELEASE ENDOSCOPIC;  Surgeon: Jolyn Nap, MD;  Location: [facility];  Service: Orthopedics;  Laterality: Right;   CHOLECYSTECTOMY N/A 12/02/2014   Procedure: LAPAROSCOPIC CHOLECYSTECTOMY;  Surgeon: Armandina Gemma, MD;  Location: [facility];  Service: General;  Laterality: N/A;   HYSTEROSCOPY WITH D & C  01-08-2002    Allergies  Allergen Reactions   Bee Venom Anaphylaxis and Swelling   Wasp Venom Anaphylaxis and Swelling   Citrus Hives and Itching   Latex Hives and Swelling   Penicillins Hives and Itching    Has patient had a PCN reaction causing immediate rash, facial/tongue/throat swelling, SOB or lightheadedness with hypotension: Yes Has patient had a PCN reaction causing severe rash involving mucus membranes or skin necrosis: Unk Has patient had a PCN reaction that required hospitalization: Was already in hosp Has patient had a PCN reaction occurring within the last 10 years: No If all of the above answers are "NO", then may proceed with Cephalosporin use.     Shellfish Allergy Other (See Comments) and Swelling    Patient is uncertain; stated that she tolerates this (??)   Soap Hives, Itching and Swelling    PUREX LAUNDRY DETERGENT   Tomato Hives and Itching   Banana Itching and Nausea Only   Chocolate Itching   Fluticasone Other (See Comments)    Per patient, makes sneeze and gag   Hydrocodone Itching and Nausea Only   Other Itching    Acidic foods- Itching [place]- Itching in the roof of the mouth   Adhesive [Tape] Itching and Rash    Allergic to  "leads"   Doxycycline Other (See Comments)    AFFECTS JOINTS   Liraglutide Nausea Only   Metformin And Related Nausea Only, Rash, Other (See Comments) and Diarrhea    GI UPSET, also   Red Dye Itching, Rash and Other (See Comments)    At eye doctor's office, the dilating drops- Skin breaks out, chest turned red, itching, face became swollen    BP 122/88   Ht 5' 2"  (1.575 m)   Wt (!) 303 lb (137.4 kg)   BMI 55.42 kg/m   No flowsheet data found.  No flowsheet data found.      Objective:  Physical Exam:  Gen: NAD, comfortable in exam room  Right shoulder: No swelling, ecchymoses.  No gross deformity. No TTP. FROM with painful arc. Negative Hawkins, Neers. Negative Yergasons. Strength 5/5 with empty can and resisted internal/external rotation.  Pain empty can. Negative apprehension. NV intact distally.  Right hand: No deformity, swelling, malrotation. Decreased flexion of 4th digit.  5/5 strength flexion and extension at PIP, DIP joints 4th digit. Tenderness to palpation over A1 pulley 4th digit.   Assessment & Plan:  1. Right shoulder pain - 2/2 rotator cuff strain.  Not improving with prednisone she  took for her asthma.  However, motion is better today than a week ago.  Given severity of her pain went ahead with subacromial injection today.  After a few days start home exercises which were reviewed today.  After informed written consent timeout was performed, patient was  seated in chair in exam room. Right shoulder was prepped with alcohol swab and utilizing lateral approach with ultrasound guidance, patient's right subacromial space was injected with 3:1 lidocaine: depomedrol. Patient tolerated the procedure well without immediate complications.  2. Right 4th digit trigger finger - Injection given today.  Continue splinting.    After informed written consent timeout was performed.  Patient was seated in chair in exam room.  Area overlying right 4th digit A1 pulley prepped with alcohol swab then injected with 0.5:0.52m lidocaine: depomedrol.  Patient tolerated procedure well without immediate complications.

## 2020-12-26 ENCOUNTER — Encounter (INDEPENDENT_AMBULATORY_CARE_PROVIDER_SITE_OTHER): Payer: Medicaid Other | Admitting: Ophthalmology

## 2020-12-26 DIAGNOSIS — E113313 Type 2 diabetes mellitus with moderate nonproliferative diabetic retinopathy with macular edema, bilateral: Secondary | ICD-10-CM

## 2020-12-26 DIAGNOSIS — I10 Essential (primary) hypertension: Secondary | ICD-10-CM

## 2020-12-26 DIAGNOSIS — H3581 Retinal edema: Secondary | ICD-10-CM

## 2020-12-26 DIAGNOSIS — H2513 Age-related nuclear cataract, bilateral: Secondary | ICD-10-CM

## 2020-12-26 DIAGNOSIS — H35033 Hypertensive retinopathy, bilateral: Secondary | ICD-10-CM

## 2020-12-30 NOTE — Progress Notes (Shared)
[facility] Note  12/31/2020     CHIEF COMPLAINT Patient presents for No chief complaint on file.   HISTORY OF PRESENT ILLNESS: Ana Long is a 45 y.o. female who presents to the clinic today for:      Referring physician: [name] MD [place], [place] [id number]  HISTORICAL INFORMATION:   Selected notes from the [facility] Re-referred by Dr. [name] for DM exam   CURRENT MEDICATIONS: Current Outpatient Medications (Ophthalmic Drugs)  Medication Sig   Olopatadine HCl 0.2 % SOLN Place one drop into both eyes daily.   No current facility-administered medications for this visit. (Ophthalmic Drugs)   Current Outpatient Medications (Other)  Medication Sig   acyclovir (ZOVIRAX) 400 MG tablet TAKE 1 TABLET BY MOUTH TWICE A DAY   albuterol (PROAIR HFA) 108 (90 Base) MCG/ACT inhaler Inhale 2 puffs into the lungs every 4 (four) hours as needed for wheezing or shortness of breath (cough). (Patient taking differently: Inhale 2 puffs into the lungs every 4 (four) hours as needed for wheezing or shortness of breath (or coughing).)   atorvastatin (LIPITOR) 10 MG tablet Take 10 mg by mouth daily.   atorvastatin (LIPITOR) 40 MG tablet Take 1 tablet (40 mg total) by mouth at bedtime.   benzonatate (TESSALON) 200 MG capsule Take 1 capsule (200 mg total) by mouth 3 (three) times daily as needed for cough. (Patient not taking: No sig reported)   Blood Glucose Monitoring Suppl (ACCU-CHEK AVIVA PLUS) w/Device KIT Please check you blood sugars 4 times a day   dapagliflozin propanediol (FARXIGA) 5 MG TABS tablet Take 5 mg by mouth daily. (Patient taking differently: Take 5 mg by mouth 2 (two) times a day.)   DERMA-SMOOTHE/FS SCALP 0.01 % OIL Apply topically.   fluconazole (DIFLUCAN) 150 MG tablet TAKE 1 TABLET BY MOUTH NOW. THEN REPEAT IN 1 WEEK   FLUoxetine (PROZAC) 20 MG capsule Take by mouth.   furosemide (LASIX) 20 MG tablet  Take 20 mg by mouth daily as needed for fluid or edema.    glucose blood (ACCU-CHEK SMARTVIEW) test strip TEST FOUR TIMES DAILY   HUMULIN R U-500 KWIKPEN 500 UNIT/ML kwikpen Inject into the skin.   hydrochlorothiazide (HYDRODIURIL) 12.5 MG tablet Take by mouth.   insulin aspart (NOVOLOG) 100 UNIT/ML injection Inject 0-20 Units into the skin every 4 (four) hours. CBG 70 - 120: 0 units  CBG 121 - 150: 3 units  CBG 151 - 200: 4 units  CBG 201 - 250: 7 units  CBG 251 - 300: 11 units  CBG 301 - 350: 15 units  CBG 351 - 400: 20 units  CBG > 400 call MD (Patient taking differently: Inject 20 Units into the skin every 4 (four) hours. CBG 70 - 120: 0 units  CBG 121 - 150: 3 units  CBG 151 - 200: 4 units  CBG 201 - 250: 7 units  CBG 251 - 300: 11 units  CBG 301 - 350: 15 units  CBG 351 - 400: 20 units  CBG > 400 call MD)   insulin aspart protamine - aspart (NOVOLOG MIX 70/30 FLEXPEN) (70-30) 100 UNIT/ML FlexPen Inject into the skin.   Insulin Glargine (LANTUS) 100 UNIT/ML Solostar Pen Inject 80 Units into the skin 2 (two) times daily.   Insulin Pen Needle (B-D UF III MINI PEN NEEDLES) 31G X 5 MM MISC USE AS DIRECTED TO INJECT INSULIN FIVE A DAY E11.65  JANUVIA 100 MG tablet TAKE 1 TABLET BY MOUTH EVERY DAY   lactulose (CEPHULAC) 10 g packet Take 1 packet (10 g total) by mouth daily as needed. Reported on 07/29/2015 (Patient taking differently: Take 10 g by mouth daily as needed (for constipation).)   Lancets (ACCU-CHEK SOFT TOUCH) lancets Use as instructed   loratadine (CLARITIN) 10 MG tablet Take 1 tablet (10 mg total) by mouth 2 (two) times daily.   methocarbamol (ROBAXIN) 500 MG tablet Take 1 tablet (500 mg total) by mouth every 8 (eight) hours as needed. (Patient taking differently: Take 500 mg by mouth every 8 (eight) hours as needed for muscle spasms.)   mometasone-formoterol (DULERA) 200-5 MCG/ACT AERO Inhale 2 puffs into the lungs 2 (two) times daily.   montelukast (SINGULAIR) 10 MG tablet  Take 1 tablet (10 mg total) by mouth at bedtime.   Naltrexone-buPROPion HCl ER (CONTRAVE) 8-90 MG TB12 Take by mouth.   nitroGLYCERIN (NITRODUR - DOSED IN MG/24 HR) 0.2 mg/hr patch Apply 1/4th patch to affected achilles, change daily   omeprazole (PRILOSEC) 20 MG capsule Take 1 capsule (20 mg total) by mouth daily before breakfast.   SPIRIVA RESPIMAT 1.25 MCG/ACT AERS SMARTSIG:2 Puff(s) Via Inhaler Daily   topiramate (TOPAMAX) 25 MG tablet Take by mouth.   Current Facility-Administered Medications (Other)  Medication Route   diphenhydrAMINE (BENADRYL) injection 12.5 mg Intramuscular    REVIEW OF SYSTEMS:     ALLERGIES Allergies  Allergen Reactions   Bee Venom Anaphylaxis and Swelling   Wasp Venom Anaphylaxis and Swelling   Citrus Hives and Itching   Latex Hives and Swelling   Penicillins Hives and Itching    Has patient had a PCN reaction causing immediate rash, facial/tongue/throat swelling, SOB or lightheadedness with hypotension: Yes Has patient had a PCN reaction causing severe rash involving mucus membranes or skin necrosis: Unk Has patient had a PCN reaction that required hospitalization: Was already in hosp Has patient had a PCN reaction occurring within the last 10 years: No If all of the above answers are "NO", then may proceed with Cephalosporin use.    Shellfish Allergy Other (See Comments) and Swelling    Patient is uncertain; stated that she tolerates this (??)   Soap Hives, Itching and Swelling    PUREX LAUNDRY DETERGENT   Tomato Hives and Itching   Banana Itching and Nausea Only   Chocolate Itching   Fluticasone Other (See Comments)    Per patient, makes sneeze and gag   Hydrocodone Itching and Nausea Only   Other Itching    Acidic foods- Itching [place]- Itching in the roof of the mouth   Adhesive [Tape] Itching and Rash    Allergic to  "leads"   Doxycycline Other (See Comments)    AFFECTS JOINTS   Liraglutide Nausea Only   Metformin And Related Nausea  Only, Rash, Other (See Comments) and Diarrhea    GI UPSET, also   Red Dye Itching, Rash and Other (See Comments)    At eye doctor's office, the dilating drops- Skin breaks out, chest turned red, itching, face became swollen    PAST MEDICAL HISTORY 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   Cataract    NS OU  Complication of anesthesia    states is hard to wake up post-op   Constipation 12/30/2014   Diabetes mellitus    intolerant to Metformin   Diabetic retinopathy ([place])    NPDR OU   Dry skin    hands    Family history of anesthesia complication    mother went into coma during c-section   Genital herpes 01/01/2006   on acyclovir BID    GERD (gastroesophageal reflux disease)    Herpes genital    Hyperglycemia 12/26/2017   Hypertensive retinopathy    OU   Low back pain 11/22/2012   Lower extremity edema 10/18/2012   Menorrhagia 11/06/2015   Morbid obesity with BMI of 50 to 60 07/23/2009   Obesity hypoventilation syndrome ([place]) 10/30/2012   Obesity, morbid ([place]) 01/10/2014   OSA on CPAP     AHI was on  11-17-12 to 120/hr, titrated to 15 cm with 3 cm EPR/    PCOS (polycystic ovarian syndrome)    Rheumatoid arthritis ([facility])    Right ankle pain 06/19/2015   Right hip pain 09/04/2017   Right shoulder pain 06/01/2017   Seasonal allergies    Sleep apnea, obstructive    uses CPAP nightly - had sleep study 08/22/2007   Past Surgical History:  Procedure Laterality Date   CARPAL TUNNEL RELEASE Right 10/02/2012   Procedure: RIGHT CARPAL TUNNEL RELEASE ENDOSCOPIC;  Surgeon: Jolyn Nap, MD;  Location: [facility];  Service: Orthopedics;  Laterality: Right;   CHOLECYSTECTOMY N/A 12/02/2014   Procedure: LAPAROSCOPIC CHOLECYSTECTOMY;   Surgeon: Armandina Gemma, MD;  Location[facility];  Service: General;  Laterality: N/A;   HYSTEROSCOPY WITH D & C  01-08-2002    FAMILY HISTORY Family History  Problem Relation Age of Onset   Other Mother    Leukemia Cousin    Other Other    Obesity Other    Other Other    Diabetes Maternal Aunt    Heart attack Maternal Aunt    Alzheimer's disease Maternal Uncle    Cancer Maternal Uncle        prostate   Diabetes Paternal Aunt    Cancer Paternal Aunt        brain,mouth   Alzheimer's disease Paternal Aunt    Diabetes Paternal Uncle    Diabetes Maternal Grandmother    Diabetes Maternal Grandfather     SOCIAL HISTORY Social History   Tobacco Use   Smoking status: Former    Years: 10.00    Types: Cigarettes    Quit date: 05/24/2010    Years since quitting: 10.6   Smokeless tobacco: Never  Vaping Use   Vaping Use: Never used  Substance Use Topics   Alcohol use: Yes    Alcohol/week: 0.0 standard drinks    Comment: socially   Drug use: No         OPHTHALMIC EXAM:  Not recorded     IMAGING AND PROCEDURES  Imaging and Procedures for @TODAY @          ASSESSMENT/PLAN:  No diagnosis found.    1,2. Moderate non-proliferative diabetic retinopathy w/ DME, OU  - delayed f/u from 4 wks to 3.5 mos (07.02.21 to 10.15.21) -- discussed importance of regular follow up and treatments  - pt lost to f/u from 10.23.2019 to 11.18.20  - FA in 2019 showed leaking MA OU; no NV OU  **of note, pt had pruritic rxn to fluorescein dye -- resolved with 12.5 mg IV benadryl**  - FA  12.16.2020 shows late leaking MA's, non-perfusion defects; no NV OU  - s/p IVA OD #1 (12.16.20), #2 (01.25.21), #3 (05.26.21), #4 (07.02.21), #5 (10.15.21), #6 (11.23.21), #7 (07.27.22), #8 (09.09.22)  - s/p focal laser OD (03.19.21), (04.01.22)  - s/p IVA OS #1 OS (12.23.20), #2 (01.27.21), #3 (03.02.21), #4 (05.26.21), #5 (07.02.21), #6 (10.15.21), #7 (11.23.21), #8 (01.11.22), #9 (03.04.22), #10  (4.27.22)  - exam shows scattered MA, IRH, exudates and mild macular edema OU  - OCT shows OD: Interval improvement in IRF,/cystic changes/foveal contour; OS: Mild interval improvement in central cyst and foveal contour at 6 wks  - BCVA OD: 20/25; OS: 20/30 -- mild decrease in vision OS             - Recommend IVA OD #9 today (11.08.22) w/ f/u at 8 wks  - pt wishes to proceed with IVA - RBA of procedure discussed, questions answered - see procedure note   - [place] informed consent form signed and scanned on 01.27.2021  - f/u 8 weeks, sooner prn -- DFE, OCT, possible injections  3,4. Hypertensive retinopathy OU  - discussed importance of tight BP control  - monitor   5. Nuclear Sclerosis   - The symptoms of cataract, surgical options, and treatments and risks were discussed with patient.  - discussed diagnosis and progression  - under the expert management of [place] Ordered this visit:  No orders of the defined types were placed in this encounter.     No follow-ups on file.  There are no Patient Instructions on file for this visit.  Explained the diagnoses, plan, and follow up with the patient and they expressed understanding.  Patient expressed understanding of the importance of proper follow up care.  This document serves as a record of services personally performed by Gardiner Sleeper, MD, PhD. It was created on their behalf by Orvan Falconer, an ophthalmic technician. The creation of this record is the provider's dictation and/or activities during the visit.    Electronically signed by: Orvan Falconer, OA, 12/30/20  10:59 AM   Gardiner Sleeper, M.D., Ph.D. Diseases & Surgery of the Retina and [place] 10/31/2020  I have reviewed the above documentation for accuracy and completeness, and I agree with the above. Gardiner Sleeper, M.D., Ph.D. 10/31/20 10:59 AM  Abbreviations: M myopia (nearsighted); A  astigmatism; H hyperopia (farsighted); P presbyopia; Mrx spectacle prescription;  CTL contact lenses; OD right eye; OS left eye; OU both eyes  XT exotropia; ET esotropia; PEK punctate epithelial keratitis; PEE punctate epithelial erosions; DES dry eye syndrome; MGD meibomian gland dysfunction; ATs artificial tears; PFAT's preservative free artificial tears; [place] nuclear sclerotic cataract; PSC posterior subcapsular cataract; ERM epi-retinal membrane; PVD posterior vitreous detachment; RD retinal detachment; DM diabetes mellitus; DR diabetic retinopathy; NPDR non-proliferative diabetic retinopathy; PDR proliferative diabetic retinopathy; CSME clinically significant macular edema; DME diabetic macular edema; dbh dot blot hemorrhages; CWS cotton wool spot; POAG primary open angle glaucoma; C/D cup-to-disc ratio; HVF humphrey visual field; GVF goldmann visual field; OCT optical coherence tomography; IOP intraocular pressure; BRVO Branch retinal vein occlusion; CRVO central retinal vein occlusion; CRAO central retinal artery occlusion; BRAO branch retinal artery occlusion; RT retinal tear; SB scleral buckle; PPV pars plana vitrectomy; VH Vitreous hemorrhage; PRP panretinal laser photocoagulation; IVK intravitreal kenalog; VMT vitreomacular traction; MH Macular hole;  NVD neovascularization of the disc; NVE neovascularization elsewhere; AREDS age related eye disease study; ARMD age related macular degeneration;  POAG primary open angle glaucoma; EBMD epithelial/anterior basement membrane dystrophy; ACIOL anterior chamber intraocular lens; IOL intraocular lens; PCIOL posterior chamber intraocular lens; Phaco/IOL phacoemulsification with intraocular lens placement; [place] photorefractive keratectomy; LASIK laser assisted in situ keratomileusis; HTN hypertension; DM diabetes mellitus; COPD chronic obstructive pulmonary disease

## 2020-12-31 ENCOUNTER — Encounter (INDEPENDENT_AMBULATORY_CARE_PROVIDER_SITE_OTHER): Payer: Self-pay | Admitting: Ophthalmology

## 2020-12-31 ENCOUNTER — Encounter (INDEPENDENT_AMBULATORY_CARE_PROVIDER_SITE_OTHER): Payer: Medicaid Other | Admitting: Ophthalmology

## 2020-12-31 ENCOUNTER — Other Ambulatory Visit: Payer: Self-pay

## 2020-12-31 ENCOUNTER — Ambulatory Visit (INDEPENDENT_AMBULATORY_CARE_PROVIDER_SITE_OTHER): Payer: Medicaid Other | Admitting: Ophthalmology

## 2020-12-31 DIAGNOSIS — H35033 Hypertensive retinopathy, bilateral: Secondary | ICD-10-CM

## 2020-12-31 DIAGNOSIS — H2513 Age-related nuclear cataract, bilateral: Secondary | ICD-10-CM

## 2020-12-31 DIAGNOSIS — I10 Essential (primary) hypertension: Secondary | ICD-10-CM

## 2020-12-31 DIAGNOSIS — E113313 Type 2 diabetes mellitus with moderate nonproliferative diabetic retinopathy with macular edema, bilateral: Secondary | ICD-10-CM

## 2020-12-31 DIAGNOSIS — H3581 Retinal edema: Secondary | ICD-10-CM

## 2020-12-31 MED ORDER — BEVACIZUMAB CHEMO INJECTION 1.25MG/0.05ML SYRINGE FOR KALEIDOSCOPE
1.2500 mg | INTRAVITREAL | Status: AC | PRN
  Administered 2020-12-31: 1.25 mg via INTRAVITREAL

## 2020-12-31 NOTE — Progress Notes (Signed)
[facility] Note  12/31/2020     CHIEF COMPLAINT Patient presents for Retina Follow Up   HISTORY OF PRESENT ILLNESS: Ana Long is a 45 y.o. female who presents to the clinic today for:   HPI     Retina Follow Up   Patient presents with  Diabetic Retinopathy.  In both eyes.  Duration of 8 weeks.  Since onset it is stable.  I, the attending physician,  performed the HPI with the patient and updated documentation appropriately.        Comments   8 week follow up NPDR OU-  Vision at times is better than others.  Still having crusting on lashes. BS 150's  A1C 9.4      Last edited by Ana Caffey, MD on 12/31/2020 11:04 PM.    Pt's BS and BP has been affected recently due to being on a short regiment of Prednisone.   Referring physician: [name] MD [place], [place] [id number]  HISTORICAL INFORMATION:   Selected notes from the [facility] Re-referred by Dr. Wyatt Long for DM exam   CURRENT MEDICATIONS: Current Outpatient Medications (Ophthalmic Drugs)  Medication Sig   Olopatadine HCl 0.2 % SOLN Place one drop into both eyes daily.   No current facility-administered medications for this visit. (Ophthalmic Drugs)   Current Outpatient Medications (Other)  Medication Sig   acyclovir (ZOVIRAX) 400 MG tablet TAKE 1 TABLET BY MOUTH TWICE A DAY   albuterol (PROAIR HFA) 108 (90 Base) MCG/ACT inhaler Inhale 2 puffs into the lungs every 4 (four) hours as needed for wheezing or shortness of breath (cough). (Patient taking differently: Inhale 2 puffs into the lungs every 4 (four) hours as needed for wheezing or shortness of breath (or coughing).)   atorvastatin (LIPITOR) 10 MG tablet Take 10 mg by mouth daily.   dapagliflozin propanediol (FARXIGA) 5 MG TABS tablet Take 5 mg by mouth daily. (Patient taking differently: Take 5 mg by mouth 2 (two) times a day.)   DERMA-SMOOTHE/FS SCALP 0.01 % OIL Apply topically.    fluconazole (DIFLUCAN) 150 MG tablet TAKE 1 TABLET BY MOUTH NOW. THEN REPEAT IN 1 WEEK   FLUoxetine (PROZAC) 20 MG capsule Take by mouth.   furosemide (LASIX) 20 MG tablet Take 20 mg by mouth daily as needed for fluid or edema.    HUMULIN R U-500 KWIKPEN 500 UNIT/ML kwikpen Inject into the skin.   hydrochlorothiazide (HYDRODIURIL) 12.5 MG tablet Take by mouth.   insulin aspart (NOVOLOG) 100 UNIT/ML injection Inject 0-20 Units into the skin every 4 (four) hours. CBG 70 - 120: 0 units  CBG 121 - 150: 3 units  CBG 151 - 200: 4 units  CBG 201 - 250: 7 units  CBG 251 - 300: 11 units  CBG 301 - 350: 15 units  CBG 351 - 400: 20 units  CBG > 400 call MD (Patient taking differently: Inject 20 Units into the skin every 4 (four) hours. CBG 70 - 120: 0 units  CBG 121 - 150: 3 units  CBG 151 - 200: 4 units  CBG 201 - 250: 7 units  CBG 251 - 300: 11 units  CBG 301 - 350: 15 units  CBG 351 - 400: 20 units  CBG > 400 call MD)   insulin aspart protamine - aspart (NOVOLOG MIX 70/30 FLEXPEN) (70-30) 100 UNIT/ML FlexPen Inject into the skin.   JANUVIA 100 MG tablet TAKE 1 TABLET  BY MOUTH EVERY DAY   lactulose (CEPHULAC) 10 g packet Take 1 packet (10 g total) by mouth daily as needed. Reported on 07/29/2015 (Patient taking differently: Take 10 g by mouth daily as needed (for constipation).)   loratadine (CLARITIN) 10 MG tablet Take 1 tablet (10 mg total) by mouth 2 (two) times daily.   methocarbamol (ROBAXIN) 500 MG tablet Take 1 tablet (500 mg total) by mouth every 8 (eight) hours as needed. (Patient taking differently: Take 500 mg by mouth every 8 (eight) hours as needed for muscle spasms.)   mometasone-formoterol (DULERA) 200-5 MCG/ACT AERO Inhale 2 puffs into the lungs 2 (two) times daily.   montelukast (SINGULAIR) 10 MG tablet Take 1 tablet (10 mg total) by mouth at bedtime.   Naltrexone-buPROPion HCl ER (CONTRAVE) 8-90 MG TB12 Take by mouth.   nitroGLYCERIN (NITRODUR - DOSED IN MG/24 HR) 0.2 mg/hr patch  Apply 1/4th patch to affected achilles, change daily   omeprazole (PRILOSEC) 20 MG capsule Take 1 capsule (20 mg total) by mouth daily before breakfast.   SPIRIVA RESPIMAT 1.25 MCG/ACT AERS SMARTSIG:2 Puff(s) Via Inhaler Daily   topiramate (TOPAMAX) 25 MG tablet Take by mouth.   atorvastatin (LIPITOR) 40 MG tablet Take 1 tablet (40 mg total) by mouth at bedtime. (Patient not taking: Reported on 12/31/2020)   benzonatate (TESSALON) 200 MG capsule Take 1 capsule (200 mg total) by mouth 3 (three) times daily as needed for cough. (Patient not taking: No sig reported)   Blood Glucose Monitoring Suppl (ACCU-CHEK AVIVA PLUS) w/Device KIT Please check you blood sugars 4 times a day   glucose blood (ACCU-CHEK SMARTVIEW) test strip TEST FOUR TIMES DAILY   Insulin Glargine (LANTUS) 100 UNIT/ML Solostar Pen Inject 80 Units into the skin 2 (two) times daily.   Insulin Pen Needle (B-D UF III MINI PEN NEEDLES) 31G X 5 MM MISC USE AS DIRECTED TO INJECT INSULIN FIVE A DAY E11.65   Lancets (ACCU-CHEK SOFT TOUCH) lancets Use as instructed   Current Facility-Administered Medications (Other)  Medication Route   diphenhydrAMINE (BENADRYL) injection 12.5 mg Intramuscular    REVIEW OF SYSTEMS: ROS   Positive for: Gastrointestinal, Endocrine, Eyes, Respiratory Negative for: Constitutional, Neurological, Skin, Genitourinary, Musculoskeletal, HENT, Cardiovascular, Psychiatric, Allergic/Imm, Heme/Lymph Last edited by Ana Long, COA on 12/31/2020  2:15 PM.     ALLERGIES Allergies  Allergen Reactions   Bee Venom Anaphylaxis and Swelling   Wasp Venom Anaphylaxis and Swelling   Citrus Hives and Itching   Latex Hives and Swelling   Penicillins Hives and Itching    Has patient had a PCN reaction causing immediate rash, facial/tongue/throat swelling, SOB or lightheadedness with hypotension: Yes Has patient had a PCN reaction causing severe rash involving mucus membranes or skin necrosis: Unk Has patient had a PCN  reaction that required hospitalization: Was already in hosp Has patient had a PCN reaction occurring within the last 10 years: No If all of the above answers are "NO", then may proceed with Cephalosporin use.    Shellfish Allergy Other (See Comments) and Swelling    Patient is uncertain; stated that she tolerates this (??)   Soap Hives, Itching and Swelling    PUREX LAUNDRY DETERGENT   Tomato Hives and Itching   Banana Itching and Nausea Only   Chocolate Itching   Fluticasone Other (See Comments)    Per patient, makes sneeze and gag   Hydrocodone Itching and Nausea Only   Other Itching    Acidic foods- Itching [place]- Itching  in the roof of the mouth   Adhesive [Tape] Itching and Rash    Allergic to  "leads"   Doxycycline Other (See Comments)    AFFECTS JOINTS   Liraglutide Nausea Only   Metformin And Related Nausea Only, Rash, Other (See Comments) and Diarrhea    GI UPSET, also   Red Dye Itching, Rash and Other (See Comments)    At eye doctor's office, the dilating drops- Skin breaks out, chest turned red, itching, face became swollen    PAST MEDICAL HISTORY 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   Cataract    NS OU   Complication of anesthesia    states is hard to wake up post-op   Constipation 12/30/2014   Diabetes mellitus    intolerant to Metformin   Diabetic retinopathy ([place])    NPDR OU   Dry skin    hands    Family history of anesthesia complication    mother went into coma during c-section   Genital herpes 01/01/2006   on acyclovir BID    GERD (gastroesophageal reflux disease)    Herpes genital    Hyperglycemia 12/26/2017   Hypertensive retinopathy    OU   Low back pain 11/22/2012   Lower extremity edema  10/18/2012   Menorrhagia 11/06/2015   Morbid obesity with BMI of 50 to 60 07/23/2009   Obesity hypoventilation syndrome ([place]) 10/30/2012   Obesity, morbid ([place]) 01/10/2014   OSA on CPAP     AHI was on  11-17-12 to 120/hr, titrated to 15 cm with 3 cm EPR/    PCOS (polycystic ovarian syndrome)    Rheumatoid arthritis ([place])    Right ankle pain 06/19/2015   Right hip pain 09/04/2017   Right shoulder pain 06/01/2017   Seasonal allergies    Sleep apnea, obstructive    uses CPAP nightly - had sleep study 08/22/2007   Past Surgical History:  Procedure Laterality Date   CARPAL TUNNEL RELEASE Right 10/02/2012   Procedure: RIGHT CARPAL TUNNEL RELEASE ENDOSCOPIC;  Surgeon: Jolyn Nap, MD;  Location: [facility];  Service: Orthopedics;  Laterality: Right;   CHOLECYSTECTOMY N/A 12/02/2014   Procedure: LAPAROSCOPIC CHOLECYSTECTOMY;  Surgeon: Armandina Gemma, MD;  Location: [facility];  Service: General;  Laterality: N/A;   HYSTEROSCOPY WITH D & C  01-08-2002    FAMILY HISTORY Family History  Problem Relation Age of Onset   Other Mother    Leukemia Cousin    Other Other    Obesity Other    Other Other    Diabetes Maternal Aunt    Heart attack Maternal Aunt    Alzheimer's disease Maternal Uncle    Cancer Maternal Uncle        prostate   Diabetes Paternal Aunt    Cancer Paternal Aunt        brain,mouth   Alzheimer's disease Paternal Aunt    Diabetes Paternal Uncle    Diabetes Maternal Grandmother    Diabetes Maternal Grandfather     SOCIAL HISTORY Social History   Tobacco Use   Smoking status: Former    Years: 10.00    Types: Cigarettes    Quit date: 05/24/2010    Years since quitting: 22.6  Smokeless tobacco: Never  Vaping Use   Vaping Use: Never used  Substance Use Topics   Alcohol use: Yes    Alcohol/week: 0.0 standard drinks    Comment: socially   Drug use: No       OPHTHALMIC EXAM: Base Eye Exam     Visual Acuity (Snellen - Linear)       Right Left   Dist cc  20/40 -2 20/30 -2   Dist ph cc 20/25 -2 20/25 -2    Correction: Glasses         Tonometry (Tonopen, 2:23 PM)       Right Left   Pressure 19 18         Pupils       Dark Light Shape React APD   Right 3 2 Round Brisk None   Left 3 2 Round Brisk None         Visual Fields (Counting fingers)       Left Right    Full Full         Extraocular Movement       Right Left    Full Full         Neuro/Psych     Oriented x3: Yes   Mood/Affect: Normal         Dilation     Both eyes: 1.0% Mydriacyl, 2.5% Phenylephrine @ 2:23 PM           Slit Lamp and Fundus Exam     Slit Lamp Exam       Right Left   Lids/Lashes Dermatochalasis - upper lid, mild Meibomian gland dysfunction Dermatochalasis - upper lid, mild Meibomian gland dysfunction   Conjunctiva/Sclera White and quiet White and quiet   Cornea 1-2+Punctate epithelial erosions 2-3+fine Punctate epithelial erosions   Anterior Chamber deep and quiet deep and quiet   Iris round and dilated, No NVI round and dilated   Lens 1+ NS, 1+CC 1+NS, 1+CC   Vitreous mild vitreous syneresis mild vitreous syneresis         Fundus Exam       Right Left   Disc Pink and sharp, +cupping Pink and sharp, +cupping   C/D Ratio 0.7 0.6   Macula good foveal reflex, focal exudates inferior to fovea and scattered superior macula, scattered MA, focal cystic changes IN macula - improved, focal laser changes inferior macula, increased cystic changes centrally good foveal reflex, scatttered MA's and exudate, central cystic changes -- slightly increased, focal cluster of exudates temporal macula, improved, small cluster of MA central macula   Vessels attenuated, Tortuous Vascular attenuation, Tortuous, Copper wiring   Periphery Attached, scattered MA/exudates greatest nasally Attached, scattered MA and exudate greatest superior to disc, white without pressure temporally           Refraction     Wearing Rx       Sphere  Cylinder Axis   Right -2.50 +1.50 013   Left -2.25 Sphere     Type: SVL            IMAGING AND PROCEDURES  Imaging and Procedures for _0 @  OCT, Retina - OU - Both Eyes       Right Eye Quality was good. Central Foveal Thickness: 243. Progression has worsened. Findings include no SRF, intraretinal fluid, intraretinal hyper-reflective material, vitreomacular adhesion , abnormal foveal contour (Mild interval increase in IRF/central cystic changes; IRF/cystic changes IN macula improved).   Left Eye Quality was good. Central Foveal Thickness: 242. Progression  has worsened. Findings include intraretinal fluid, no SRF, abnormal foveal contour, intraretinal hyper-reflective material (Mild interval increase in central cystic changes and IRF SN macula).   Notes *Images captured and stored on drive  Diagnosis / Impression:  +DME OU-- persistent IRF/IRHM OD: Mild interval increase in IRF/central cystic changes; IRF/cc IN macula improved OS: Mild interval increase in central cystic changes and IRF SN macula  Clinical management:  See below  Abbreviations: NFP - Normal foveal profile. CME - cystoid macular edema. PED - pigment epithelial detachment. IRF - intraretinal fluid. SRF - subretinal fluid. EZ - ellipsoid zone. ERM - epiretinal membrane. ORA - outer retinal atrophy. ORT - outer retinal tubulation. SRHM - subretinal hyper-reflective material      Intravitreal Injection, Pharmacologic Agent - OD - Right Eye       Time Out 12/31/2020. 3:34 PM. Confirmed correct patient, procedure, site, and patient consented.   Anesthesia Topical anesthesia was used. Anesthetic medications included Lidocaine 2%, Proparacaine 0.5%.   Procedure Preparation included 5% betadine to ocular surface, eyelid speculum. A supplied needle was used.   Injection: 1.25 mg Bevacizumab 1.74m/0.05ml   Route: Intravitreal, Site: Right Eye   NDC:: 19147-829-56 Lot: [id number]<\[id number], Expiration date:  01/28/2021, Waste: 0 mL   Post-op Post injection exam found visual acuity of at least counting fingers. The patient tolerated the procedure well. There were no complications. The patient received written and verbal post procedure care education. Post injection medications were not given.             ASSESSMENT/PLAN:    ICD-10-CM   1. Moderate nonproliferative diabetic retinopathy of both eyes with macular edema associated with type 2 diabetes mellitus (HCC)  EV95.6387Intravitreal Injection, Pharmacologic Agent - OD - Right Eye    Bevacizumab (AVASTIN) SOLN 1.25 mg    2. Retinal edema  H35.81 OCT, Retina - OU - Both Eyes    3. Essential hypertension  I10     4. Hypertensive retinopathy of both eyes  H35.033     5. Nuclear sclerosis of both eyes  H25.13      1,2. Moderate non-proliferative diabetic retinopathy w/ DME, OU  - delayed f/u from 4 wks to 3.5 mos (07.02.21 to 10.15.21) -- discussed importance of regular follow up and treatments  - pt lost to f/u from 10.23.2019 to 11.18.20  - FA in 2019 showed leaking MA OU; no NV OU  **of note, pt had pruritic rxn to fluorescein dye -- resolved with 12.5 mg IV benadryl**  - FA 12.16.2020 shows late leaking MA's, non-perfusion defects; no NV OU  - s/p IVA OD #1 (12.16.20), #2 (01.25.21), #3 (05.26.21), #4 (07.02.21), #5 (10.15.21), #6 (11.23.21), #7 (07.27.22), #8 (9.9.22)  - s/p focal laser OD (03.19.21), (04.01.22)  - s/p IVA OS #1 OS (12.23.20), #2 (01.27.21), #3 (03.02.21), #4 (05.26.21), #5 (07.02.21), #6 (10.15.21), #7 (11.23.21), #8 (01.11.22), #9 (03.04.22), #10 (4.27.22)  - exam shows scattered MA, IRH, exudates and mild macular edema OU  - OCT shows OD: Mild interval increase in IRF/central cystic changes; IRF/cc IN macula improved; OS: Mild interval increase in central cystic changes and IRF SN macula  - BCVA OU: 20/25             - Recommend IVA OU, but pt wishes to just do OD #9 today (11.9.22) and return for IVA OS #11  -  pt wishes to proceed with IVA OD - RBA of procedure discussed, questions answered - see procedure note   - Avast[place]  informed consent form signed and scanned on 01.27.2021  - f/u Monday, 11.14.22, for IVA OS w/DFE OS/OCT  3,4. Hypertensive retinopathy OU  - discussed importance of tight BP control  - monitor   5. Nuclear Sclerosis   - The symptoms of cataract, surgical options, and treatments and risks were discussed with patient.  - discussed diagnosis and progression  - under the expert management of [facility] Ordered this visit:  Meds ordered this encounter  Medications   Bevacizumab (AVASTIN) SOLN 1.25 mg     Return in about 5 days (around 01/05/2021) for IVA OS w/DFE OS/OCT.  There are no Patient Instructions on file for this visit.  Explained the diagnoses, plan, and follow up with the patient and they expressed understanding.  Patient expressed understanding of the importance of proper follow up care.  This document serves as a record of services personally performed by Gardiner Sleeper, MD, PhD. It was created on their behalf by Estill Bakes, COT an ophthalmic technician. The creation of this record is the provider's dictation and/or activities during the visit.    Electronically signed by: Estill Bakes, COT 11.9.22 @ 11:22 PM   Gardiner Sleeper, M.D., Ph.D. Diseases & Surgery of the Retina and [place] 11.9.22   I have reviewed the above documentation for accuracy and completeness, and I agree with the above. Gardiner Sleeper, M.D., Ph.D. 12/31/20 11:27 PM   Abbreviations: M myopia (nearsighted); A astigmatism; H hyperopia (farsighted); P presbyopia; Mrx spectacle prescription;  CTL contact lenses; OD right eye; OS left eye; OU both eyes  XT exotropia; ET esotropia; PEK punctate epithelial keratitis; PEE punctate epithelial erosions; DES dry eye syndrome; MGD meibomian gland dysfunction; ATs artificial tears;  PFAT's preservative free artificial tears; [place] nuclear sclerotic cataract; PSC posterior subcapsular cataract; ERM epi-retinal membrane; PVD posterior vitreous detachment; RD retinal detachment; DM diabetes mellitus; DR diabetic retinopathy; NPDR non-proliferative diabetic retinopathy; PDR proliferative diabetic retinopathy; CSME clinically significant macular edema; DME diabetic macular edema; dbh dot blot hemorrhages; CWS cotton wool spot; POAG primary open angle glaucoma; C/D cup-to-disc ratio; HVF humphrey visual field; GVF goldmann visual field; OCT optical coherence tomography; IOP intraocular pressure; BRVO Branch retinal vein occlusion; CRVO central retinal vein occlusion; CRAO central retinal artery occlusion; BRAO branch retinal artery occlusion; RT retinal tear; SB scleral buckle; PPV pars plana vitrectomy; VH Vitreous hemorrhage; PRP panretinal laser photocoagulation; IVK intravitreal kenalog; VMT vitreomacular traction; MH Macular hole;  NVD neovascularization of the disc; NVE neovascularization elsewhere; AREDS age related eye disease study; ARMD age related macular degeneration; POAG primary open angle glaucoma; EBMD epithelial/anterior basement membrane dystrophy; ACIOL anterior chamber intraocular lens; IOL intraocular lens; PCIOL posterior chamber intraocular lens; Phaco/IOL phacoemulsification with intraocular lens placement; [place] photorefractive keratectomy; LASIK laser assisted in situ keratomileusis; HTN hypertension; DM diabetes mellitus; COPD chronic obstructive pulmonary disease

## 2021-01-05 ENCOUNTER — Other Ambulatory Visit: Payer: Self-pay

## 2021-01-05 ENCOUNTER — Encounter (INDEPENDENT_AMBULATORY_CARE_PROVIDER_SITE_OTHER): Payer: Self-pay | Admitting: Ophthalmology

## 2021-01-05 ENCOUNTER — Ambulatory Visit (INDEPENDENT_AMBULATORY_CARE_PROVIDER_SITE_OTHER): Payer: Medicaid Other | Admitting: Ophthalmology

## 2021-01-05 DIAGNOSIS — I10 Essential (primary) hypertension: Secondary | ICD-10-CM | POA: Diagnosis not present

## 2021-01-05 DIAGNOSIS — H2513 Age-related nuclear cataract, bilateral: Secondary | ICD-10-CM

## 2021-01-05 DIAGNOSIS — H3581 Retinal edema: Secondary | ICD-10-CM

## 2021-01-05 DIAGNOSIS — H35033 Hypertensive retinopathy, bilateral: Secondary | ICD-10-CM

## 2021-01-05 DIAGNOSIS — E113313 Type 2 diabetes mellitus with moderate nonproliferative diabetic retinopathy with macular edema, bilateral: Secondary | ICD-10-CM

## 2021-01-05 MED ORDER — BEVACIZUMAB CHEMO INJECTION 1.25MG/0.05ML SYRINGE FOR KALEIDOSCOPE
1.2500 mg | INTRAVITREAL | Status: AC | PRN
  Administered 2021-01-05: 1.25 mg via INTRAVITREAL

## 2021-01-05 NOTE — Progress Notes (Signed)
[facility] Note  01/05/2021     CHIEF COMPLAINT Patient presents for Retina Follow Up   HISTORY OF PRESENT ILLNESS: Ana Long is a 45 y.o. female who presents to the clinic today for:   HPI     Retina Follow Up   Patient presents with  Other.  In left eye.  This started 1 week ago.  I, the attending physician,  performed the HPI with the patient and updated documentation appropriately.        Comments   Patient here for 1 week retina follow up for IVA OS. Patient states vision is doing. No eye pain. Has issues with night vision. Cant see the medium in the road. Night lights are irritating.  Patient wanted only the OS dilated.       Last edited by Bernarda Caffey, MD on 01/05/2021 11:29 PM.    Pt here for IVA OS   Referring physician: [name] MD [place], [place] [address]  HISTORICAL INFORMATION:   Selected notes from the [facility]-referred by Dr. [name] for DM exam   CURRENT MEDICATIONS: Current Outpatient Medications (Ophthalmic Drugs)  Medication Sig   Olopatadine HCl 0.2 % SOLN Place one drop into both eyes daily.   No current facility-administered medications for this visit. (Ophthalmic Drugs)   Current Outpatient Medications (Other)  Medication Sig   acyclovir (ZOVIRAX) 400 MG tablet TAKE 1 TABLET BY MOUTH TWICE A DAY   albuterol (PROAIR HFA) 108 (90 Base) MCG/ACT inhaler Inhale 2 puffs into the lungs every 4 (four) hours as needed for wheezing or shortness of breath (cough). (Patient taking differently: Inhale 2 puffs into the lungs every 4 (four) hours as needed for wheezing or shortness of breath (or coughing).)   atorvastatin (LIPITOR) 10 MG tablet Take 10 mg by mouth daily.   atorvastatin (LIPITOR) 40 MG tablet Take 1 tablet (40 mg total) by mouth at bedtime.   benzonatate (TESSALON) 200 MG capsule Take 1 capsule (200 mg total) by mouth 3 (three) times daily as needed for  cough.   Blood Glucose Monitoring Suppl (ACCU-CHEK AVIVA PLUS) w/Device KIT Please check you blood sugars 4 times a day   DERMA-SMOOTHE/FS SCALP 0.01 % OIL Apply topically.   fluconazole (DIFLUCAN) 150 MG tablet TAKE 1 TABLET BY MOUTH NOW. THEN REPEAT IN 1 WEEK   glucose blood (ACCU-CHEK SMARTVIEW) test strip TEST FOUR TIMES DAILY   HUMULIN R U-500 KWIKPEN 500 UNIT/ML kwikpen Inject into the skin.   hydrochlorothiazide (HYDRODIURIL) 12.5 MG tablet Take by mouth.   insulin aspart (NOVOLOG) 100 UNIT/ML injection Inject 0-20 Units into the skin every 4 (four) hours. CBG 70 - 120: 0 units  CBG 121 - 150: 3 units  CBG 151 - 200: 4 units  CBG 201 - 250: 7 units  CBG 251 - 300: 11 units  CBG 301 - 350: 15 units  CBG 351 - 400: 20 units  CBG > 400 call MD (Patient taking differently: Inject 20 Units into the skin every 4 (four) hours. CBG 70 - 120: 0 units  CBG 121 - 150: 3 units  CBG 151 - 200: 4 units  CBG 201 - 250: 7 units  CBG 251 - 300: 11 units  CBG 301 - 350: 15 units  CBG 351 - 400: 20 units  CBG > 400 call MD)   Insulin Pen Needle (B-D UF III MINI PEN NEEDLES) 31G X 5  MM MISC USE AS DIRECTED TO INJECT INSULIN FIVE A DAY E11.65   lactulose (CEPHULAC) 10 g packet Take 1 packet (10 g total) by mouth daily as needed. Reported on 07/29/2015 (Patient taking differently: Take 10 g by mouth daily as needed (for constipation).)   Lancets (ACCU-CHEK SOFT TOUCH) lancets Use as instructed   loratadine (CLARITIN) 10 MG tablet Take 1 tablet (10 mg total) by mouth 2 (two) times daily.   mometasone-formoterol (DULERA) 200-5 MCG/ACT AERO Inhale 2 puffs into the lungs 2 (two) times daily.   montelukast (SINGULAIR) 10 MG tablet Take 1 tablet (10 mg total) by mouth at bedtime.   Naltrexone-buPROPion HCl ER (CONTRAVE) 8-90 MG TB12 Take by mouth.   omeprazole (PRILOSEC) 20 MG capsule Take 1 capsule (20 mg total) by mouth daily before breakfast.   SPIRIVA RESPIMAT 1.25 MCG/ACT AERS SMARTSIG:2 Puff(s) Via  Inhaler Daily   dapagliflozin propanediol (FARXIGA) 5 MG TABS tablet Take 5 mg by mouth daily. (Patient not taking: Reported on 01/05/2021)   FLUoxetine (PROZAC) 20 MG capsule Take by mouth. (Patient not taking: Reported on 01/05/2021)   furosemide (LASIX) 20 MG tablet Take 20 mg by mouth daily as needed for fluid or edema.  (Patient not taking: Reported on 01/05/2021)   insulin aspart protamine - aspart (NOVOLOG MIX 70/30 FLEXPEN) (70-30) 100 UNIT/ML FlexPen Inject into the skin. (Patient not taking: Reported on 01/05/2021)   Insulin Glargine (LANTUS) 100 UNIT/ML Solostar Pen Inject 80 Units into the skin 2 (two) times daily.   JANUVIA 100 MG tablet TAKE 1 TABLET BY MOUTH EVERY DAY (Patient not taking: Reported on 01/05/2021)   methocarbamol (ROBAXIN) 500 MG tablet Take 1 tablet (500 mg total) by mouth every 8 (eight) hours as needed. (Patient not taking: Reported on 01/05/2021)   nitroGLYCERIN (NITRODUR - DOSED IN MG/24 HR) 0.2 mg/hr patch Apply 1/4th patch to affected achilles, change daily (Patient not taking: Reported on 01/05/2021)   topiramate (TOPAMAX) 25 MG tablet Take by mouth. (Patient not taking: Reported on 01/05/2021)   Current Facility-Administered Medications (Other)  Medication Route   diphenhydrAMINE (BENADRYL) injection 12.5 mg Intramuscular    REVIEW OF SYSTEMS: ROS   Positive for: Gastrointestinal, Endocrine, Eyes, Respiratory Negative for: Constitutional, Neurological, Skin, Genitourinary, Musculoskeletal, HENT, Cardiovascular, Psychiatric, Allergic/Imm, Heme/Lymph Last edited by Theodore Demark, COA on 01/05/2021 10:19 AM.     ALLERGIES Allergies  Allergen Reactions   Bee Venom Anaphylaxis and Swelling   Wasp Venom Anaphylaxis and Swelling   Citrus Hives and Itching   Latex Hives and Swelling   Penicillins Hives and Itching    Has patient had a PCN reaction causing immediate rash, facial/tongue/throat swelling, SOB or lightheadedness with hypotension: Yes Has  patient had a PCN reaction causing severe rash involving mucus membranes or skin necrosis: Unk Has patient had a PCN reaction that required hospitalization: Was already in hosp Has patient had a PCN reaction occurring within the last 10 years: No If all of the above answers are "NO", then may proceed with Cephalosporin use.    Shellfish Allergy Other (See Comments) and Swelling    Patient is uncertain; stated that she tolerates this (??)   Soap Hives, Itching and Swelling    PUREX LAUNDRY DETERGENT   Tomato Hives and Itching   Banana Itching and Nausea Only   Chocolate Itching   Fluticasone Other (See Comments)    Per patient, makes sneeze and gag   Hydrocodone Itching and Nausea Only   Other Itching  Acidic foods- Itching [place]- Itching in the roof of the mouth   Adhesive [Tape] Itching and Rash    Allergic to  "leads"   Doxycycline Other (See Comments)    AFFECTS JOINTS   Liraglutide Nausea Only   Metformin And Related Nausea Only, Rash, Other (See Comments) and Diarrhea    GI UPSET, also   Red Dye Itching, Rash and Other (See Comments)    At eye doctor's office, the dilating drops- Skin breaks out, chest turned red, itching, face became swollen    PAST MEDICAL HISTORY 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   Cataract    NS OU   Complication of anesthesia    states is hard to wake up post-op   Constipation 12/30/2014   Diabetes mellitus    intolerant to Metformin   Diabetic retinopathy ([place])    NPDR OU   Dry skin    hands    Family history of anesthesia complication    mother went into coma during c-section   Genital herpes 01/01/2006   on acyclovir BID    GERD (gastroesophageal reflux disease)    Herpes genital     Hyperglycemia 12/26/2017   Hypertensive retinopathy    OU   Low back pain 11/22/2012   Lower extremity edema 10/18/2012   Menorrhagia 11/06/2015   Morbid obesity with BMI of 50 to 60 07/23/2009   Obesity hypoventilation syndrome ([place]) 10/30/2012   Obesity, morbid ([place]) 01/10/2014   OSA on CPAP     AHI was on  11-17-12 to 120/hr, titrated to 15 cm with 3 cm EPR/    PCOS (polycystic ovarian syndrome)    Rheumatoid arthritis (HCC)    Right ankle pain 06/19/2015   Right hip pain 09/04/2017   Right shoulder pain 06/01/2017   Seasonal allergies    Sleep apnea, obstructive    uses CPAP nightly - had sleep study 08/22/2007   Past Surgical History:  Procedure Laterality Date   CARPAL TUNNEL RELEASE Right 10/02/2012   Procedure: RIGHT CARPAL TUNNEL RELEASE ENDOSCOPIC;  Surgeon: Jolyn Nap, MD;  Location: [facility];  Service: Orthopedics;  Laterality: Right;   CHOLECYSTECTOMY N/A 12/02/2014   Procedure: LAPAROSCOPIC CHOLECYSTECTOMY;  Surgeon: Armandina Gemma, MD;  Location: [facility];  Service: General;  Laterality: N/A;   HYSTEROSCOPY WITH D & C  01-08-2002    FAMILY HISTORY Family History  Problem Relation Age of Onset   Other Mother    Leukemia Cousin    Other Other    Obesity Other    Other Other    Diabetes Maternal Aunt    Heart attack Maternal Aunt    Alzheimer's disease Maternal Uncle    Cancer Maternal Uncle        prostate   Diabetes Paternal Aunt    Cancer Paternal Aunt        brain,mouth   Alzheimer's disease Paternal Aunt    Diabetes Paternal Uncle    Diabetes Maternal Grandmother    Diabetes Maternal Grandfather     SOCIAL HISTORY Social History   Tobacco Use   Smoking status: Former    Years: 10.00    Types: Cigarettes    Quit date: 05/24/2010  Years since quitting: 10.6   Smokeless tobacco: Never  Vaping Use   Vaping Use: Never used  Substance Use Topics   Alcohol use: Yes    Alcohol/week: 0.0 standard drinks    Comment: socially   Drug use: No        OPHTHALMIC EXAM: Base Eye Exam     Visual Acuity (Snellen - Linear)       Right Left   Dist cc 20/30 -2 20/30 -2   Dist ph cc 20/20 20/25 +2    Correction: Glasses         Tonometry (Tonopen, 10:17 AM)       Right Left   Pressure 22 20         Pupils       Dark Light Shape React APD   Right 3 2 Round Brisk None   Left 3 2 Round Brisk None         Visual Fields (Counting fingers)       Left Right    Full Full         Extraocular Movement       Right Left    Full, Ortho Full, Ortho         Neuro/Psych     Oriented x3: Yes   Mood/Affect: Normal         Dilation     Left eye: 1.0% Mydriacyl, 2.5% Phenylephrine @ 10:17 AM           Slit Lamp and Fundus Exam     Slit Lamp Exam       Right Left   Lids/Lashes Dermatochalasis - upper lid, mild Meibomian gland dysfunction Dermatochalasis - upper lid, mild Meibomian gland dysfunction   Conjunctiva/Sclera White and quiet White and quiet   Cornea 1-2+Punctate epithelial erosions 2-3+fine Punctate epithelial erosions   Anterior Chamber deep and quiet deep and quiet   Iris round and dilated, No NVI round and dilated   Lens 1+ NS, 1+CC 1+NS, 1+CC   Vitreous mild vitreous syneresis mild vitreous syneresis         Fundus Exam       Right Left   Disc Pink and sharp, +cupping Pink and sharp, +cupping   C/D Ratio 0.7 0.6   Macula good foveal reflex, focal exudates inferior to fovea and scattered superior macula, scattered MA, focal cystic changes IN macula - improved, focal laser changes inferior macula, increased cystic changes centrally good foveal reflex, scatttered MA's and exudate, central cystic changes -- slightly increased, focal cluster of exudates temporal macula, improved, small cluster of MA central macula   Vessels attenuated, Tortuous Vascular attenuation, Tortuous, Copper wiring   Periphery Attached, scattered MA/exudates greatest nasally Attached, scattered MA and exudate  greatest superior to disc, white without pressure temporally           Refraction     Wearing Rx       Sphere Cylinder Axis   Right -2.50 +1.50 013   Left -2.25 Sphere     Type: SVL            IMAGING AND PROCEDURES  Imaging and Procedures for _0 @  OCT, Retina - OU - Both Eyes       Right Eye Quality was good. Central Foveal Thickness: 243. Progression has worsened. Findings include no SRF, intraretinal fluid, intraretinal hyper-reflective material, vitreomacular adhesion , abnormal foveal contour (Mild interval increase in central cystic changes greatest temporal macula and nasal fovea).   Left Eye  Quality was good. Central Foveal Thickness: 245. Progression has worsened. Findings include intraretinal fluid, no SRF, abnormal foveal contour, intraretinal hyper-reflective material (Persistent IRF/cystic changes and IRF SN macula and nasal fovea).   Notes *Images captured and stored on drive  Diagnosis / Impression:  +DME OU OD: Mild interval increase in central cystic changes greatest temporal macula and nasal fovea OS: Persistent IRF/cystic changes and IRF SN macula and nasal fovea  Clinical management:  See below  Abbreviations: NFP - Normal foveal profile. CME - cystoid macular edema. PED - pigment epithelial detachment. IRF - intraretinal fluid. SRF - subretinal fluid. EZ - ellipsoid zone. ERM - epiretinal membrane. ORA - outer retinal atrophy. ORT - outer retinal tubulation. SRHM - subretinal hyper-reflective material      Intravitreal Injection, Pharmacologic Agent - OS - Left Eye       Time Out 01/05/2021. 12:38 PM. Confirmed correct patient, procedure, site, and patient consented.   Anesthesia Topical anesthesia was used. Anesthetic medications included Lidocaine 2%, Proparacaine 0.5%.   Procedure Preparation included 5% betadine to ocular surface, eyelid speculum. A supplied needle was used.   Injection: 1.25 mg Bevacizumab 1.68m/0.05ml    Route: Intravitreal, Site: Left Eye   NDC:: 19622-297-98 Lot: [id number] , Expiration date: 03/03/2021, Waste: 0 mL   Post-op Post injection exam found visual acuity of at least counting fingers. The patient tolerated the procedure well. There were no complications. The patient received written and verbal post procedure care education. Post injection medications were not given.             ASSESSMENT/PLAN:    ICD-10-CM   1. Moderate nonproliferative diabetic retinopathy of both eyes with macular edema associated with type 2 diabetes mellitus (HCC)  EX21.1941Intravitreal Injection, Pharmacologic Agent - OS - Left Eye    Bevacizumab (AVASTIN) SOLN 1.25 mg    2. Retinal edema  H35.81 OCT, Retina - OU - Both Eyes    3. Essential hypertension  I10     4. Hypertensive retinopathy of both eyes  H35.033     5. Nuclear sclerosis of both eyes  H25.13      1,2. Moderate non-proliferative diabetic retinopathy w/ DME, OU  - delayed f/u from 4 wks to 3.5 mos (07.02.21 to 10.15.21) -- discussed importance of regular follow up and treatments  - pt lost to f/u from 10.23.2019 to 11.18.20  - FA in 2019 showed leaking MA OU; no NV OU  **of note, pt had pruritic rxn to fluorescein dye -- resolved with 12.5 mg IV benadryl**  - FA 12.16.2020 shows late leaking MA's, non-perfusion defects; no NV OU  - s/p IVA OD #1 (12.16.20), #2 (01.25.21), #3 (05.26.21), #4 (07.02.21), #5 (10.15.21), #6 (11.23.21), #7 (07.27.22), #8 (09.09.22), #9 (11.11.22)  - s/p focal laser OD (03.19.21), (04.01.22)  - s/p IVA OS #1 OS (12.23.20), #2 (01.27.21), #3 (03.02.21), #4 (05.26.21), #5 (07.02.21), #6 (10.15.21), #7 (11.23.21), #8 (01.11.22), #9 (03.04.22), #10 (04.27.22)  - exam shows scattered MA, IRH, exudates and mild macular edema OU  - OCT shows OD: Mild interval increase in IRF/central cystic changes; IRF/cc IN macula improved; OS: Mild interval increase in central cystic changes and IRF SN macula  - BCVA 20/20,  20/25             - Recommend IVA OS  today (11.14.22) and return in 8 weeks  - pt wishes to proceed with IVA OS - RBA of procedure discussed, questions answered - see procedure note   - Avastin  informed consent form signed and scanned on 01.27.2021  - f/u 8 wks -- DFE/OCT, possible injxn  3,4. Hypertensive retinopathy OU  - discussed importance of tight BP control  - monitor   5. Nuclear Sclerosis   - The symptoms of cataract, surgical options, and treatments and risks were discussed with patient.  - discussed diagnosis and progression  - under the expert management of [place] Ordered this visit:  Meds ordered this encounter  Medications   Bevacizumab (AVASTIN) SOLN 1.25 mg      Return in about 8 weeks (around 03/02/2021) for f/u 8 weeks, NPDR OU, DFE, OCT.  There are no Patient Instructions on file for this visit.  Explained the diagnoses, plan, and follow up with the patient and they expressed understanding.  Patient expressed understanding of the importance of proper follow up care.  This document serves as a record of services personally performed by Gardiner Sleeper, MD, PhD. It was created on their behalf by Estill Bakes, COT an ophthalmic technician. The creation of this record is the provider's dictation and/or activities during the visit.    Electronically signed by: Estill Bakes, COT 11.9.22 @ 11:36 PM   Gardiner Sleeper, M.D., Ph.D. Diseases & Surgery of the Retina and [place] 11.9.22   I have reviewed the above documentation for accuracy and completeness, and I agree with the above. Gardiner Sleeper, M.D., Ph.D. 01/05/21 11:36 PM   Abbreviations: M myopia (nearsighted); A astigmatism; H hyperopia (farsighted); P presbyopia; Mrx spectacle prescription;  CTL contact lenses; OD right eye; OS left eye; OU both eyes  XT exotropia; ET esotropia; PEK punctate epithelial keratitis; PEE punctate epithelial  erosions; DES dry eye syndrome; MGD meibomian gland dysfunction; ATs artificial tears; PFAT's preservative free artificial tears; [place] nuclear sclerotic cataract; PSC posterior subcapsular cataract; ERM epi-retinal membrane; PVD posterior vitreous detachment; RD retinal detachment; DM diabetes mellitus; DR diabetic retinopathy; NPDR non-proliferative diabetic retinopathy; PDR proliferative diabetic retinopathy; CSME clinically significant macular edema; DME diabetic macular edema; dbh dot blot hemorrhages; CWS cotton wool spot; POAG primary open angle glaucoma; C/D cup-to-disc ratio; HVF humphrey visual field; GVF goldmann visual field; OCT optical coherence tomography; IOP intraocular pressure; BRVO Branch retinal vein occlusion; CRVO central retinal vein occlusion; CRAO central retinal artery occlusion; BRAO branch retinal artery occlusion; RT retinal tear; SB scleral buckle; PPV pars plana vitrectomy; VH Vitreous hemorrhage; PRP panretinal laser photocoagulation; IVK intravitreal kenalog; VMT vitreomacular traction; MH Macular hole;  NVD neovascularization of the disc; NVE neovascularization elsewhere; AREDS age related eye disease study; ARMD age related macular degeneration; POAG primary open angle glaucoma; EBMD epithelial/anterior basement membrane dystrophy; ACIOL anterior chamber intraocular lens; IOL intraocular lens; PCIOL posterior chamber intraocular lens; Phaco/IOL phacoemulsification with intraocular lens placement; [place] photorefractive keratectomy; LASIK laser assisted in situ keratomileusis; HTN hypertension; DM diabetes mellitus; COPD chronic obstructive pulmonary disease

## 2021-01-13 ENCOUNTER — Encounter: Payer: Self-pay | Admitting: Gastroenterology

## 2021-01-13 ENCOUNTER — Ambulatory Visit (INDEPENDENT_AMBULATORY_CARE_PROVIDER_SITE_OTHER): Payer: Medicaid Other | Admitting: Gastroenterology

## 2021-01-13 VITALS — BP 110/62 | HR 84 | Ht 62.0 in | Wt 299.4 lb

## 2021-01-13 DIAGNOSIS — Z1211 Encounter for screening for malignant neoplasm of colon: Secondary | ICD-10-CM

## 2021-01-13 DIAGNOSIS — K5909 Other constipation: Secondary | ICD-10-CM | POA: Diagnosis not present

## 2021-01-13 DIAGNOSIS — K219 Gastro-esophageal reflux disease without esophagitis: Secondary | ICD-10-CM

## 2021-01-13 NOTE — Progress Notes (Signed)
[facility] Consult Note:  History: Ana Long 01/13/2021  Referring provider: Willeen Niece, PA  Reason for consult/chief complaint: Colonoscopy (Thinks she had a Colonocopy about 7-8 years, done through [facility].)   Subjective  HPI: Ana Long was referred by primary care for consideration of screening colonoscopy.  She has many years of chronic constipation, seen here once in 2018 for that and reflux symptoms.  She reports both of those things are stable, and that the reflux symptoms have actually improved somewhat over the years that she has managed to lose some weight.  She is increased her water and fiber intake and only needs to take lactulose periodically for relief of constipation.  She denies rectal pain or bleeding.  No parents or siblings with colorectal cancer. Denies dysphagia or odynophagia  ROS:  Review of Systems  Constitutional:  Positive for fatigue. Negative for appetite change and unexpected weight change.  HENT:  Negative for mouth sores and voice change.   Eyes:  Negative for pain and redness.  Respiratory:  Negative for cough and shortness of breath.   Cardiovascular:  Negative for chest pain and palpitations.  Genitourinary:  Negative for dysuria and hematuria.  Musculoskeletal:  Positive for back pain. Negative for arthralgias and myalgias.  Skin:  Negative for pallor and rash.  Neurological:  Negative for weakness and headaches.  Hematological:  Negative for adenopathy.    Past Medical History: 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   Cataract    NS OU   Complication of anesthesia    states is hard to wake up post-op   Constipation 12/30/2014   Diabetes mellitus    intolerant to  Metformin   Diabetic retinopathy ([place])    NPDR OU   Dry skin    hands    Family history of anesthesia complication    mother went into coma during c-section   Genital herpes 01/01/2006   on acyclovir BID    GERD (gastroesophageal reflux disease)    Herpes genital    Hyperglycemia 12/26/2017   Hypertensive retinopathy    OU   Low back pain 11/22/2012   Lower extremity edema 10/18/2012   Menorrhagia 11/06/2015   Morbid obesity with BMI of 50 to 60 07/23/2009   Obesity hypoventilation syndrome ([place]) 10/30/2012   Obesity, morbid ([employer]) 01/10/2014   OSA on CPAP     AHI was on  11-17-12 to 120/hr, titrated to 15 cm with 3 cm EPR/    PCOS (polycystic ovarian syndrome)    Rheumatoid arthritis (HCC)    Right ankle pain 06/19/2015   Right hip pain 09/04/2017   Right shoulder pain 06/01/2017   Seasonal allergies    Sleep apnea, obstructive    uses CPAP nightly - had sleep study 08/22/2007     Past Surgical History: Past Surgical History:  Procedure Laterality Date   CARPAL TUNNEL RELEASE Right 10/02/2012   Procedure: RIGHT CARPAL TUNNEL RELEASE ENDOSCOPIC;  Surgeon: Jolyn Nap, MD;  Location: [place];  Service: Orthopedics;  Laterality: Right;   CHOLECYSTECTOMY N/A 12/02/2014   Procedure: LAPAROSCOPIC CHOLECYSTECTOMY;  Surgeon: Armandina Gemma, MD;  Location: [facility];  Service: General;  Laterality: N/A;   HYSTEROSCOPY WITH D &  C  01-08-2002     Family History: Family History  Problem Relation Age of Onset   Other Mother    Diabetes Maternal Aunt    Heart attack Maternal Aunt    Alzheimer's disease Maternal Uncle    Cancer Maternal Uncle        prostate   Diabetes Paternal Aunt    Cancer Paternal Aunt        brain,mouth   Alzheimer's disease Paternal Aunt    Colon polyps Paternal Uncle    Diabetes Paternal Uncle    Diabetes Maternal Grandmother    Diabetes Maternal Grandfather    Leukemia Cousin    Other Other    Obesity Other    Other Other    Colitis Neg Hx      Social History: Social History   Socioeconomic History   Marital status: Divorced    Spouse name: Not on file   Number of children: 2   Years of education: 14   Highest education level: Not on file  Occupational History   Occupation: UNEMPLOYED    Employer: UNEMPLOYED  Tobacco Use   Smoking status: Former    Years: 10.00    Types: Cigarettes    Quit date: 05/24/2010    Years since quitting: 10.6   Smokeless tobacco: Never  Vaping Use   Vaping Use: Never used  Substance and Sexual Activity   Alcohol use: Yes    Alcohol/week: 0.0 standard drinks    Comment: socially   Drug use: No   Sexual activity: Yes    Birth control/protection: None    Comment: Lives w/a partner  Other Topics Concern   Not on file  Social History Narrative   Single, but lives with a partner   Has 2 children and cares for an infant during the day         Social Determinants of Health   Financial Resource Strain: Not on file  Food Insecurity: Not on file  Transportation Needs: Not on file  Physical Activity: Not on file  Stress: Not on file  Social Connections: Not on file    Allergies: Allergies  Allergen Reactions   Bee Venom Anaphylaxis and Swelling   Wasp Venom Anaphylaxis and Swelling   Citrus Hives and Itching   Latex Hives and Swelling   Penicillins Hives and Itching    Has patient had a PCN reaction causing immediate rash, facial/tongue/throat swelling, SOB or lightheadedness with hypotension: Yes Has patient had a PCN reaction causing severe rash involving mucus membranes or skin necrosis: Unk Has patient had a PCN reaction that required hospitalization: Was already in hosp Has patient had a PCN reaction occurring within the last 10 years: No If all of the above answers are "NO", then may proceed with Cephalosporin use.    Shellfish Allergy Other (See Comments) and Swelling    Patient is uncertain; stated that she tolerates this (??)   Soap Hives, Itching and Swelling     PUREX LAUNDRY DETERGENT   Tomato Hives and Itching   Banana Itching and Nausea Only   Chocolate Itching   Fluticasone Other (See Comments)    Per patient, makes sneeze and gag   Hydrocodone Itching and Nausea Only   Other Itching    Acidic foods- Itching [place]- Itching in the roof of the mouth   Adhesive [Tape] Itching and Rash    Allergic to  "leads"   Doxycycline Other (See Comments)    AFFECTS JOINTS   Liraglutide Nausea  Only   Metformin And Related Nausea Only, Rash, Other (See Comments) and Diarrhea    GI UPSET, also   Red Dye Itching, Rash and Other (See Comments)    At eye doctor's office, the dilating drops- Skin breaks out, chest turned red, itching, face became swollen    Outpatient Meds: Current Outpatient Medications  Medication Sig Dispense Refill   acyclovir (ZOVIRAX) 400 MG tablet TAKE 1 TABLET BY MOUTH TWICE A DAY 60 tablet 2   albuterol (PROAIR HFA) 108 (90 Base) MCG/ACT inhaler Inhale 2 puffs into the lungs every 4 (four) hours as needed for wheezing or shortness of breath (cough). (Patient taking differently: Inhale 2 puffs into the lungs every 4 (four) hours as needed for wheezing or shortness of breath (or coughing).) 6.7 g 2   atorvastatin (LIPITOR) 10 MG tablet Take 10 mg by mouth daily.     benzonatate (TESSALON) 200 MG capsule Take 1 capsule (200 mg total) by mouth 3 (three) times daily as needed for cough. 20 capsule 0   Blood Glucose Monitoring Suppl (ACCU-CHEK AVIVA PLUS) w/Device KIT Please check you blood sugars 4 times a day 1 kit 0   DERMA-SMOOTHE/FS SCALP 0.01 % OIL Apply topically.     fluconazole (DIFLUCAN) 150 MG tablet TAKE 1 TABLET BY MOUTH NOW. THEN REPEAT IN 1 WEEK     furosemide (LASIX) 20 MG tablet Take 20 mg by mouth daily as needed for fluid or edema.     glucose blood (ACCU-CHEK SMARTVIEW) test strip TEST FOUR TIMES DAILY 150 each 6   HUMULIN R U-500 KWIKPEN 500 UNIT/ML kwikpen Inject into the skin.     hydrochlorothiazide (HYDRODIURIL)  12.5 MG tablet Take by mouth.     Insulin Pen Needle (B-D UF III MINI PEN NEEDLES) 31G X 5 MM MISC USE AS DIRECTED TO INJECT INSULIN FIVE A DAY E11.65 100 each 6   lactulose (CEPHULAC) 10 g packet Take 1 packet (10 g total) by mouth daily as needed. Reported on 07/29/2015 (Patient taking differently: Take 10 g by mouth daily as needed (for constipation).) 30 each 0   Lancets (ACCU-CHEK SOFT TOUCH) lancets Use as instructed 100 each 12   loratadine (CLARITIN) 10 MG tablet Take 1 tablet (10 mg total) by mouth 2 (two) times daily. 180 tablet 1   mometasone-formoterol (DULERA) 200-5 MCG/ACT AERO Inhale 2 puffs into the lungs 2 (two) times daily. 13 Inhaler 1   montelukast (SINGULAIR) 10 MG tablet Take 1 tablet (10 mg total) by mouth at bedtime. 90 tablet 1   nitroGLYCERIN (NITRODUR - DOSED IN MG/24 HR) 0.2 mg/hr patch Apply 1/4th patch to affected achilles, change daily 30 patch 1   Olopatadine HCl 0.2 % SOLN Place one drop into both eyes daily.     omeprazole (PRILOSEC) 20 MG capsule Take 1 capsule (20 mg total) by mouth daily before breakfast. 90 capsule 2   SPIRIVA RESPIMAT 1.25 MCG/ACT AERS SMARTSIG:2 Puff(s) Via Inhaler Daily     topiramate (TOPAMAX) 25 MG tablet Take by mouth.     methocarbamol (ROBAXIN) 500 MG tablet Take 1 tablet (500 mg total) by mouth every 8 (eight) hours as needed. (Patient not taking: Reported on 01/05/2021) 60 tablet 1   Current Facility-Administered Medications  Medication Dose Route Frequency Provider Last Rate Last Admin   diphenhydrAMINE (BENADRYL) injection 12.5 mg  12.5 mg Intramuscular Once Bernarda Caffey, MD          ___________________________________________________________________ Objective   Exam:  BP 110/62  Pulse 84   Ht 5' 2" (1.575 m)   Wt 299 lb 6.4 oz (135.8 kg)   BMI 54.76 kg/m  Wt Readings from Last 3 Encounters:  01/13/21 299 lb 6.4 oz (135.8 kg)  12/22/20 (!) 303 lb (137.4 kg)  12/15/20 (!) 303 lb (137.4 kg)    General: Pleasant and  conversational, gets on exam table without difficulty. Eyes: sclera anicteric, no redness ENT: oral mucosa moist without lesions, no cervical or supraclavicular lymphadenopathy.  Thick neck CV: Heart sounds distant, pulse regular, no JVD, no peripheral edema Resp: clear to auscultation bilaterally, normal RR and effort noted GI: soft, morbidly obese, no tenderness, with active bowel sounds.  Exam for mass or organomegaly limited by body habitus. Skin; warm and dry, no rash or jaundice noted Neuro: awake, alert and oriented x 3. Normal gross motor function and fluent speech  Labs:  09/29/20 PCP office note reviewed, no accompanying data Assessment: Encounter Diagnoses  Name Primary?   Special screening for malignant neoplasms, colon Yes   Chronic constipation    Gastroesophageal reflux disease, unspecified whether esophagitis present     Chronic stable constipation, improved from last visit.  Reflux symptoms persist, no red flag signs.  On low-dose daily PPI.  I asked if she had ever been seen by bariatric clinic, and she thought that perhaps she had been.  She does not know if she was ever considered as a candidate for bariatric surgery.  This is worth reconsideration given the risk to her overall health with underlying sleep apnea and reflux.  She is age-appropriate for colorectal cancer screening, average risk.  Body habitus significantly increases risk of sedation for procedure. We discussed options of colonoscopy, Cologuard, along with sensitivity and risks of both. Plan:  I recommended she have a Cologuard test.  Positive, colonoscopy in the hospital outpatient endoscopy lab.  If negative, repeat screening in 3 years and return to see [place] as needed for any symptoms of concern.  Thank you for the courtesy of this consult.  Please call me with any questions or concerns.  Nelida Meuse III  CC: Referring provider noted above

## 2021-01-13 NOTE — Patient Instructions (Signed)
Your provider has ordered Cologuard testing as an option for colon cancer screening. This is performed by [employer] and may be out of network with your insurance. PRIOR to completing the test, it is YOUR responsibility to contact your insurance about covered benefits for this test. Your out of pocket expense could be anywhere from $0.00 to $649.00.   When you call to check coverage with your insurer, please provide the following information:   -The ONLY provider of Cologuard is [place] code for Cologuard is [phone].  [employer] NPI # [id number]  -[employer] Tax ID # [id number]   We have already sent your demographic and insurance information to [employer] (phone number [phone]) and they should contact you within the next week regarding your test. If you have not heard from them within the next week, please call our office at [phone].  If you are age 85 or younger, your body mass index should be between 19-25. Your Body mass index is 54.76 kg/m. If this is out of the aformentioned range listed, please consider follow up with your Primary Care Provider.   ________________________________________________________  The [facility] providers would like to encourage you to use [facility] to communicate with providers for non-urgent requests or questions.  Due to long hold times on the telephone, sending your provider a message by [facility] may be a faster and more efficient way to get a response.  Please allow 48 business hours for a response.  Please remember that this is for non-urgent requests.   Thank you for choosing me and [facility].  Dr.Danis

## 2021-02-02 ENCOUNTER — Ambulatory Visit: Payer: Medicaid Other | Admitting: Family Medicine

## 2021-02-19 LAB — COLOGUARD

## 2021-02-26 NOTE — Progress Notes (Signed)
[facility] Note  03/03/2021     CHIEF COMPLAINT Patient presents for Retina Follow Up  HISTORY OF PRESENT ILLNESS: Ana Long is a 46 y.o. female who presents to the clinic today for:   HPI     Retina Follow Up   Patient presents with  Diabetic Retinopathy.  In both eyes.  Severity is moderate.  Duration of 8 weeks.  Since onset it is stable.  I, the attending physician,  performed the HPI with the patient and updated documentation appropriately.        Comments   Patient states unsure if any vision changes. BS was 117 this am. Last a1c was 8.9, checked 01.05.23.       Last edited by Bernarda Caffey, MD on 03/04/2021 11:16 PM.    Pt states she has had a constant floater in her left eye since last injection, she states she sees it mostly when looking down, glare when driving is getting worse  Referring physician: [name] MD [address], [place] [address]  HISTORICAL INFORMATION:   Selected notes from the [facility] Re-referred by Dr. [name] for DM exam   CURRENT MEDICATIONS: Current Outpatient Medications (Ophthalmic Drugs)  Medication Sig   Olopatadine HCl 0.2 % SOLN Place one drop into both eyes daily.   No current facility-administered medications for this visit. (Ophthalmic Drugs)   Current Outpatient Medications (Other)  Medication Sig   acyclovir (ZOVIRAX) 400 MG tablet TAKE 1 TABLET BY MOUTH TWICE A DAY   albuterol (PROAIR HFA) 108 (90 Base) MCG/ACT inhaler Inhale 2 puffs into the lungs every 4 (four) hours as needed for wheezing or shortness of breath (cough). (Patient taking differently: Inhale 2 puffs into the lungs every 4 (four) hours as needed for wheezing or shortness of breath (or coughing).)   atorvastatin (LIPITOR) 10 MG tablet Take 10 mg by mouth daily.   benzonatate (TESSALON) 200 MG capsule Take 1 capsule (200 mg total) by mouth 3 (three) times daily as needed for cough.   Blood  Glucose Monitoring Suppl (ACCU-CHEK AVIVA PLUS) w/Device KIT Please check you blood sugars 4 times a day   DERMA-SMOOTHE/FS SCALP 0.01 % OIL Apply topically.   fluconazole (DIFLUCAN) 150 MG tablet TAKE 1 TABLET BY MOUTH NOW. THEN REPEAT IN 1 WEEK   furosemide (LASIX) 20 MG tablet Take 20 mg by mouth daily as needed for fluid or edema.   glucose blood (ACCU-CHEK SMARTVIEW) test strip TEST FOUR TIMES DAILY   HUMULIN R U-500 KWIKPEN 500 UNIT/ML kwikpen Inject into the skin.   hydrochlorothiazide (HYDRODIURIL) 12.5 MG tablet Take by mouth.   Insulin Pen Needle (B-D UF III MINI PEN NEEDLES) 31G X 5 MM MISC USE AS DIRECTED TO INJECT INSULIN FIVE A DAY E11.65   lactulose (CEPHULAC) 10 g packet Take 1 packet (10 g total) by mouth daily as needed. Reported on 07/29/2015 (Patient taking differently: Take 10 g by mouth daily as needed (for constipation).)   Lancets (ACCU-CHEK SOFT TOUCH) lancets Use as instructed   loratadine (CLARITIN) 10 MG tablet Take 1 tablet (10 mg total) by mouth 2 (two) times daily.   mometasone-formoterol (DULERA) 200-5 MCG/ACT AERO Inhale 2 puffs into the lungs 2 (two) times daily.   montelukast (SINGULAIR) 10 MG tablet Take 1 tablet (10 mg total) by mouth at bedtime.   nitroGLYCERIN (NITRODUR - DOSED IN MG/24 HR) 0.2 mg/hr patch Apply 1/4th patch to affected achilles, change  daily   omeprazole (PRILOSEC) 20 MG capsule Take 1 capsule (20 mg total) by mouth daily before breakfast.   SPIRIVA RESPIMAT 1.25 MCG/ACT AERS SMARTSIG:2 Puff(s) Via Inhaler Daily   topiramate (TOPAMAX) 25 MG tablet Take by mouth.   methocarbamol (ROBAXIN) 500 MG tablet Take 1 tablet (500 mg total) by mouth every 8 (eight) hours as needed. (Patient not taking: Reported on 01/05/2021)   Current Facility-Administered Medications (Other)  Medication Route   diphenhydrAMINE (BENADRYL) injection 12.5 mg Intramuscular   REVIEW OF SYSTEMS: ROS   Positive for: Gastrointestinal, Endocrine, Eyes,  Respiratory Negative for: Constitutional, Neurological, Skin, Genitourinary, Musculoskeletal, HENT, Cardiovascular, Psychiatric, Allergic/Imm, Heme/Lymph Last edited by Jobe Marker, COT on 03/03/2021  1:34 PM.     ALLERGIES Allergies  Allergen Reactions   Bee Venom Anaphylaxis and Swelling   Wasp Venom Anaphylaxis and Swelling   Citrus Hives and Itching   Latex Hives and Swelling   Penicillins Hives and Itching    Has patient had a PCN reaction causing immediate rash, facial/tongue/throat swelling, SOB or lightheadedness with hypotension: Yes Has patient had a PCN reaction causing severe rash involving mucus membranes or skin necrosis: Unk Has patient had a PCN reaction that required hospitalization: Was already in hosp Has patient had a PCN reaction occurring within the last 10 years: No If all of the above answers are "NO", then may proceed with Cephalosporin use.    Shellfish Allergy Other (See Comments) and Swelling    Patient is uncertain; stated that she tolerates this (??)   Soap Hives, Itching and Swelling    PUREX LAUNDRY DETERGENT   Tomato Hives and Itching   Banana Itching and Nausea Only   Chocolate Itching   Fluticasone Other (See Comments)    Per patient, makes sneeze and gag   Hydrocodone Itching and Nausea Only   Other Itching    Acidic foods- Itching [place]- Itching in the roof of the mouth   Adhesive [Tape] Itching and Rash    Allergic to  "leads"   Doxycycline Other (See Comments)    AFFECTS JOINTS   Liraglutide Nausea Only   Metformin And Related Nausea Only, Rash, Other (See Comments) and Diarrhea    GI UPSET, also   Red Dye Itching, Rash and Other (See Comments)    At eye doctor's office, the dilating drops- Skin breaks out, chest turned red, itching, face became swollen   PAST MEDICAL HISTORY Past Medical History:  Diagnosis Date   Acute nonintractable headache 12/26/2017   Arthritis    major joints   Asthma    prn inhaler   Asthma  exacerbation 07/25/2013   Asthma in adult, severe persistent, with acute exacerbation 01/11/2018   Carpal tunnel syndrome of right wrist 09/2012   Cataract    NS OU   Complication of anesthesia    states is hard to wake up post-op   Constipation 12/30/2014   Diabetes mellitus    intolerant to Metformin   Diabetic retinopathy (HCC)    NPDR OU   Dry skin    hands    Family history of anesthesia complication    mother went into coma during c-section   Genital herpes 01/01/2006   on acyclovir BID    GERD (gastroesophageal reflux disease)    Herpes genital    Hyperglycemia 12/26/2017   Hypertensive retinopathy    OU   Low back pain 11/22/2012   Lower extremity edema 10/18/2012   Menorrhagia 11/06/2015   Morbid obesity with BMI  of 50 to 60 07/23/2009   Obesity hypoventilation syndrome ([place]) 10/30/2012   Obesity, morbid ([place]) 01/10/2014   OSA on CPAP     AHI was on  11-17-12 to 120/hr, titrated to 15 cm with 3 cm EPR/    PCOS (polycystic ovarian syndrome)    Rheumatoid arthritis ([place])    Right ankle pain 06/19/2015   Right hip pain 09/04/2017   Right shoulder pain 06/01/2017   Seasonal allergies    Sleep apnea, obstructive    uses CPAP nightly - had sleep study 08/22/2007   Past Surgical History:  Procedure Laterality Date   CARPAL TUNNEL RELEASE Right 10/02/2012   Procedure: RIGHT CARPAL TUNNEL RELEASE ENDOSCOPIC;  Surgeon: Jolyn Nap, MD;  Location: [facility];  Service: Orthopedics;  Laterality: Right;   CHOLECYSTECTOMY N/A 12/02/2014   Procedure: LAPAROSCOPIC CHOLECYSTECTOMY;  Surgeon: Armandina Gemma, MD;  Location: [facility];  Service: General;  Laterality: N/A;   HYSTEROSCOPY WITH D & C  01-08-2002   FAMILY HISTORY Family History  Problem Relation Age of Onset   Other Mother    Diabetes Maternal Aunt    Heart attack Maternal Aunt    Alzheimer's disease Maternal Uncle    Cancer Maternal Uncle        prostate   Diabetes Paternal Aunt    Cancer Paternal Aunt         brain,mouth   Alzheimer's disease Paternal Aunt    Colon polyps Paternal Uncle    Diabetes Paternal Uncle    Diabetes Maternal Grandmother    Diabetes Maternal Grandfather    Leukemia Cousin    Other Other    Obesity Other    Other Other    Colitis Neg Hx    SOCIAL HISTORY Social History   Tobacco Use   Smoking status: Former    Years: 10.00    Types: Cigarettes    Quit date: 05/24/2010    Years since quitting: 10.7   Smokeless tobacco: Never  Vaping Use   Vaping Use: Never used  Substance Use Topics   Alcohol use: Yes    Alcohol/week: 0.0 standard drinks    Comment: socially   Drug use: No       OPHTHALMIC EXAM: Base Eye Exam     Visual Acuity (Snellen - Linear)       Right Left   Dist cc 20/30 +2 20/40 +2   Dist ph cc 20/25 20/25 +2    Correction: Glasses         Tonometry (Tonopen, 1:43 PM)       Right Left   Pressure 18 17         Pupils       Dark Light Shape React APD   Right 3 2 Round Brisk None   Left 3 2 Round Brisk None         Visual Fields (Counting fingers)       Left Right    Full Full         Extraocular Movement       Right Left    Full, Ortho Full, Ortho         Neuro/Psych     Oriented x3: Yes   Mood/Affect: Normal         Dilation     Both eyes: 1.0% Mydriacyl, 2.5% Phenylephrine @ 1:43 PM           Slit Lamp and Fundus Exam     Slit Lamp  Exam       Right Left   Lids/Lashes Dermatochalasis - upper lid, mild Meibomian gland dysfunction Dermatochalasis - upper lid, mild Meibomian gland dysfunction   Conjunctiva/Sclera White and quiet White and quiet   Cornea 1-2+Punctate epithelial erosions 2-3+fine Punctate epithelial erosions   Anterior Chamber deep and quiet deep and quiet   Iris round and dilated, No NVI round and dilated   Lens 1+ NS, 1+CC 1+NS, 1+CC   Anterior Vitreous mild vitreous syneresis mild vitreous syneresis, scattered vitreous condensations         Fundus Exam       Right  Left   Disc Pink and sharp, +cupping Pink and sharp, +cupping   C/D Ratio 0.7 0.6   Macula good foveal reflex, focal exudates inferior to fovea and scattered superior macula, scattered MA, focal cystic changes IN macula - improved, focal laser changes inferior macula good foveal reflex, scatttered MA's and exudate, central cystic changes -- slightly increased, focal cluster of exudates temporal macula -  improved, small cluster of MA central macula   Vessels attenuated, Tortuous Vascular attenuation, Tortuous, Copper wiring   Periphery Attached, scattered MA/exudates greatest nasally Attached, scattered MA and exudate greatest superior to disc, white without pressure temporally           Refraction     Wearing Rx       Sphere Cylinder Axis   Right -2.50 +1.50 013   Left -2.25 Sphere     Type: SVL           IMAGING AND PROCEDURES  Imaging and Procedures for @TODAY @  OCT, Retina - OU - Both Eyes       Right Eye Quality was good. Central Foveal Thickness: 231. Progression has improved. Findings include no SRF, intraretinal fluid, intraretinal hyper-reflective material, vitreomacular adhesion , abnormal foveal contour (Interval improvement in IRF/IRHM).   Left Eye Quality was good. Central Foveal Thickness: 259. Progression has worsened. Findings include intraretinal fluid, no SRF, abnormal foveal contour, intraretinal hyper-reflective material (Mild interval increase in IRF/cystic changes and IRF inferior and nasal fovea and macula).   Notes *Images captured and stored on drive  Diagnosis / Impression:  +DME OU OD: Interval improvement in IRF/IRHM OS: Mild interval increase in IRF/cystic changes and IRF inferior and nasal fovea and macula  Clinical management:  See below  Abbreviations: NFP - Normal foveal profile. CME - cystoid macular edema. PED - pigment epithelial detachment. IRF - intraretinal fluid. SRF - subretinal fluid. EZ - ellipsoid zone. ERM - epiretinal  membrane. ORA - outer retinal atrophy. ORT - outer retinal tubulation. SRHM - subretinal hyper-reflective material      Intravitreal Injection, Pharmacologic Agent - OS - Left Eye       Time Out 03/03/2021. 2:09 PM. Confirmed correct patient, procedure, site, and patient consented.   Anesthesia Topical anesthesia was used. Anesthetic medications included Lidocaine 2%, Proparacaine 0.5%.   Procedure Preparation included 5% betadine to ocular surface, eyelid speculum. A (32g) needle was used.   Injection: 1.25 mg Bevacizumab 1.49m/0.05ml   Route: Intravitreal, Site: Left Eye   NDC:: 97353-299-24 Lot:: [id number] Expiration date: 04/02/2021, Waste: 0.05 mL   Post-op Post injection exam found visual acuity of at least counting fingers. The patient tolerated the procedure well. There were no complications. The patient received written and verbal post procedure care education. Post injection medications were not given.            ASSESSMENT/PLAN:    ICD-10-CM   1.  Moderate nonproliferative diabetic retinopathy of both eyes with macular edema associated with type 2 diabetes mellitus (HCC)  E11.3313 OCT, Retina - OU - Both Eyes    Intravitreal Injection, Pharmacologic Agent - OS - Left Eye    Bevacizumab (AVASTIN) SOLN 1.25 mg    2. Essential hypertension  I10     3. Hypertensive retinopathy of both eyes  H35.033     4. Nuclear sclerosis of both eyes  H25.13       1. Moderate non-proliferative diabetic retinopathy w/ DME, OU  - pt lost to f/u from 10.23.2019 to 11.18.20  - FA in 2019 showed leaking MA OU; no NV OU  **of note, pt had pruritic rxn to fluorescein dye -- resolved with 12.5 mg IV benadryl**  - FA 12.16.2020 shows late leaking MA's, non-perfusion defects; no NV OU  - s/p IVA OD #1 (12.16.20), #2 (01.25.21), #3 (05.26.21), #4 (07.02.21), #5 (10.15.21), #6 (11.23.21), #7 (07.27.22), #8 (09.09.22), #9 (11.11.22)  - s/p focal laser OD (03.19.21), (04.01.22)  - s/p IVA OS  #1 OS (12.23.20), #2 (01.27.21), #3 (03.02.21), #4 (05.26.21), #5 (07.02.21), #6 (10.15.21), #7 (11.23.21), #8 (01.11.22), #9 (03.04.22), #10 (04.27.22), #11 (11.14.22)  - exam shows scattered MA, IRH, exudates and mild macular edema OU  - OCT shows OD: Interval improvement in IRF/IRHM; OS: Mild interval increase in IRF/cystic changes and IRF inferior and nasal fovea and macula  - BCVA 20/25 OU             - Recommend IVA OS #12 today (01.10.23)  - pt wishes to proceed with IVA OS - RBA of procedure discussed, questions answered - see procedure note   - Avastin informed consent form signed and scanned on 01.27.2021  - f/u 1 wks -- DFE/OCT, possible injxn OD  - f/u 6 weeks, DFE/OCT, possible injxn OS  2,3. Hypertensive retinopathy OU  - discussed importance of tight BP control  - monitor   4. Nuclear Sclerosis   - The symptoms of cataract, surgical options, and treatments and risks were discussed with patient.  - discussed diagnosis and progression  - under the expert management of [place] Ordered this visit:  Meds ordered this encounter  Medications   Bevacizumab (AVASTIN) SOLN 1.25 mg      Return in about 1 week (around 03/10/2021) for f/u NPDR OU, DFE, OCT.  There are no Patient Instructions on file for this visit.  Explained the diagnoses, plan, and follow up with the patient and they expressed understanding.  Patient expressed understanding of the importance of proper follow up care.  This document serves as a record of services personally performed by Gardiner Sleeper, MD, PhD. It was created on their behalf by Estill Bakes, COT an ophthalmic technician. The creation of this record is the provider's dictation and/or activities during the visit.    Electronically signed by: Estill Bakes, COT 1.5.23 @ 11:21 PM   This document serves as a record of services personally performed by Gardiner Sleeper, MD, PhD. It was created on their behalf by San Jetty.  Owens Shark, OA an ophthalmic technician. The creation of this record is the provider's dictation and/or activities during the visit.    Electronically signed by: San Jetty. Owens Shark, [place] 01.10.2023 11:21 PM   Gardiner Sleeper, M.D., Ph.D. Diseases & Surgery of the Retina and Vitreous [facility]  I have reviewed the above documentation for accuracy and completeness, and I agree with the  above. Gardiner Sleeper, M.D., Ph.D. 03/04/21 11:21 PM   Abbreviations: M myopia (nearsighted); A astigmatism; H hyperopia (farsighted); P presbyopia; Mrx spectacle prescription;  CTL contact lenses; OD right eye; OS left eye; OU both eyes  XT exotropia; ET esotropia; PEK punctate epithelial keratitis; PEE punctate epithelial erosions; DES dry eye syndrome; MGD meibomian gland dysfunction; ATs artificial tears; PFAT's preservative free artificial tears; [place] nuclear sclerotic cataract; PSC posterior subcapsular cataract; ERM epi-retinal membrane; PVD posterior vitreous detachment; RD retinal detachment; DM diabetes mellitus; DR diabetic retinopathy; NPDR non-proliferative diabetic retinopathy; PDR proliferative diabetic retinopathy; CSME clinically significant macular edema; DME diabetic macular edema; dbh dot blot hemorrhages; CWS cotton wool spot; POAG primary open angle glaucoma; C/D cup-to-disc ratio; HVF humphrey visual field; GVF goldmann visual field; OCT optical coherence tomography; IOP intraocular pressure; BRVO Branch retinal vein occlusion; CRVO central retinal vein occlusion; CRAO central retinal artery occlusion; BRAO branch retinal artery occlusion; RT retinal tear; SB scleral buckle; PPV pars plana vitrectomy; VH Vitreous hemorrhage; PRP panretinal laser photocoagulation; IVK intravitreal kenalog; VMT vitreomacular traction; MH Macular hole;  NVD neovascularization of the disc; NVE neovascularization elsewhere; AREDS age related eye disease study; ARMD age related macular degeneration; POAG  primary open angle glaucoma; EBMD epithelial/anterior basement membrane dystrophy; ACIOL anterior chamber intraocular lens; IOL intraocular lens; PCIOL posterior chamber intraocular lens; Phaco/IOL phacoemulsification with intraocular lens placement; [place] photorefractive keratectomy; LASIK laser assisted in situ keratomileusis; HTN hypertension; DM diabetes mellitus; COPD chronic obstructive pulmonary disease

## 2021-03-03 ENCOUNTER — Ambulatory Visit (INDEPENDENT_AMBULATORY_CARE_PROVIDER_SITE_OTHER): Payer: Medicaid Other | Admitting: Ophthalmology

## 2021-03-03 ENCOUNTER — Other Ambulatory Visit: Payer: Self-pay

## 2021-03-03 ENCOUNTER — Encounter (INDEPENDENT_AMBULATORY_CARE_PROVIDER_SITE_OTHER): Payer: Self-pay | Admitting: Ophthalmology

## 2021-03-03 DIAGNOSIS — H35033 Hypertensive retinopathy, bilateral: Secondary | ICD-10-CM | POA: Diagnosis not present

## 2021-03-03 DIAGNOSIS — H3581 Retinal edema: Secondary | ICD-10-CM

## 2021-03-03 DIAGNOSIS — E113313 Type 2 diabetes mellitus with moderate nonproliferative diabetic retinopathy with macular edema, bilateral: Secondary | ICD-10-CM | POA: Diagnosis not present

## 2021-03-03 DIAGNOSIS — H2513 Age-related nuclear cataract, bilateral: Secondary | ICD-10-CM | POA: Diagnosis not present

## 2021-03-03 DIAGNOSIS — I10 Essential (primary) hypertension: Secondary | ICD-10-CM | POA: Diagnosis not present

## 2021-03-04 ENCOUNTER — Encounter (INDEPENDENT_AMBULATORY_CARE_PROVIDER_SITE_OTHER): Payer: Self-pay | Admitting: Ophthalmology

## 2021-03-04 DIAGNOSIS — E113313 Type 2 diabetes mellitus with moderate nonproliferative diabetic retinopathy with macular edema, bilateral: Secondary | ICD-10-CM

## 2021-03-04 MED ORDER — BEVACIZUMAB CHEMO INJECTION 1.25MG/0.05ML SYRINGE FOR KALEIDOSCOPE
1.2500 mg | INTRAVITREAL | Status: AC | PRN
  Administered 2021-03-04: 1.25 mg via INTRAVITREAL

## 2021-03-09 NOTE — Progress Notes (Shared)
[facility] Note  03/11/2021     CHIEF COMPLAINT Patient presents for No chief complaint on file.  HISTORY OF PRESENT ILLNESS: Ana Long is a 46 y.o. female who presents to the clinic today for:    Referring physician: [name] MD [place], [place] [id number]  HISTORICAL INFORMATION:   Selected notes from the [facility]-referred by Dr. [name] for DM exam   CURRENT MEDICATIONS: Current Outpatient Medications (Ophthalmic Drugs)  Medication Sig   Olopatadine HCl 0.2 % SOLN Place one drop into both eyes daily.   No current facility-administered medications for this visit. (Ophthalmic Drugs)   Current Outpatient Medications (Other)  Medication Sig   acyclovir (ZOVIRAX) 400 MG tablet TAKE 1 TABLET BY MOUTH TWICE A DAY   albuterol (PROAIR HFA) 108 (90 Base) MCG/ACT inhaler Inhale 2 puffs into the lungs every 4 (four) hours as needed for wheezing or shortness of breath (cough). (Patient taking differently: Inhale 2 puffs into the lungs every 4 (four) hours as needed for wheezing or shortness of breath (or coughing).)   atorvastatin (LIPITOR) 10 MG tablet Take 10 mg by mouth daily.   benzonatate (TESSALON) 200 MG capsule Take 1 capsule (200 mg total) by mouth 3 (three) times daily as needed for cough.   Blood Glucose Monitoring Suppl (ACCU-CHEK AVIVA PLUS) w/Device KIT Please check you blood sugars 4 times a day   DERMA-SMOOTHE/FS SCALP 0.01 % OIL Apply topically.   fluconazole (DIFLUCAN) 150 MG tablet TAKE 1 TABLET BY MOUTH NOW. THEN REPEAT IN 1 WEEK   furosemide (LASIX) 20 MG tablet Take 20 mg by mouth daily as needed for fluid or edema.   glucose blood (ACCU-CHEK SMARTVIEW) test strip TEST FOUR TIMES DAILY   HUMULIN R U-500 KWIKPEN 500 UNIT/ML kwikpen Inject into the skin.   hydrochlorothiazide (HYDRODIURIL) 12.5 MG tablet Take by mouth.   Insulin Pen Needle (B-D UF III MINI PEN NEEDLES) 31G X 5 MM MISC USE  AS DIRECTED TO INJECT INSULIN FIVE A DAY E11.65   lactulose (CEPHULAC) 10 g packet Take 1 packet (10 g total) by mouth daily as needed. Reported on 07/29/2015 (Patient taking differently: Take 10 g by mouth daily as needed (for constipation).)   Lancets (ACCU-CHEK SOFT TOUCH) lancets Use as instructed   loratadine (CLARITIN) 10 MG tablet Take 1 tablet (10 mg total) by mouth 2 (two) times daily.   methocarbamol (ROBAXIN) 500 MG tablet Take 1 tablet (500 mg total) by mouth every 8 (eight) hours as needed. (Patient not taking: Reported on 01/05/2021)   mometasone-formoterol (DULERA) 200-5 MCG/ACT AERO Inhale 2 puffs into the lungs 2 (two) times daily.   montelukast (SINGULAIR) 10 MG tablet Take 1 tablet (10 mg total) by mouth at bedtime.   nitroGLYCERIN (NITRODUR - DOSED IN MG/24 HR) 0.2 mg/hr patch Apply 1/4th patch to affected achilles, change daily   omeprazole (PRILOSEC) 20 MG capsule Take 1 capsule (20 mg total) by mouth daily before breakfast.   SPIRIVA RESPIMAT 1.25 MCG/ACT AERS SMARTSIG:2 Puff(s) Via Inhaler Daily   topiramate (TOPAMAX) 25 MG tablet Take by mouth.   Current Facility-Administered Medications (Other)  Medication Route   diphenhydrAMINE (BENADRYL) injection 12.5 mg Intramuscular   REVIEW OF SYSTEMS:   ALLERGIES Allergies  Allergen Reactions   Bee Venom Anaphylaxis and Swelling   Wasp Venom Anaphylaxis and Swelling   Citrus Hives and Itching   Latex Hives and Swelling   Penicillins  Hives and Itching    Has patient had a PCN reaction causing immediate rash, facial/tongue/throat swelling, SOB or lightheadedness with hypotension: Yes Has patient had a PCN reaction causing severe rash involving mucus membranes or skin necrosis: Unk Has patient had a PCN reaction that required hospitalization: Was already in hosp Has patient had a PCN reaction occurring within the last 10 years: No If all of the above answers are "NO", then may proceed with Cephalosporin use.    Shellfish  Allergy Other (See Comments) and Swelling    Patient is uncertain; stated that she tolerates this (??)   Soap Hives, Itching and Swelling    PUREX LAUNDRY DETERGENT   Tomato Hives and Itching   Banana Itching and Nausea Only   Chocolate Itching   Fluticasone Other (See Comments)    Per patient, makes sneeze and gag   Hydrocodone Itching and Nausea Only   Other Itching    Acidic foods- Itching [place]- Itching in the roof of the mouth   Adhesive [Tape] Itching and Rash    Allergic to  "leads"   Doxycycline Other (See Comments)    AFFECTS JOINTS   Liraglutide Nausea Only   Metformin And Related Nausea Only, Rash, Other (See Comments) and Diarrhea    GI UPSET, also   Red Dye Itching, Rash and Other (See Comments)    At eye doctor's office, the dilating drops- Skin breaks out, chest turned red, itching, face became swollen   PAST MEDICAL HISTORY 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   Cataract    NS OU   Complication of anesthesia    states is hard to wake up post-op   Constipation 12/30/2014   Diabetes mellitus    intolerant to Metformin   Diabetic retinopathy ([place])    NPDR OU   Dry skin    hands    Family history of anesthesia complication    mother went into coma during c-section   Genital herpes 01/01/2006   on acyclovir BID    GERD (gastroesophageal reflux disease)    Herpes genital    Hyperglycemia 12/26/2017   Hypertensive retinopathy    OU   Low back pain 11/22/2012   Lower extremity edema 10/18/2012   Menorrhagia 11/06/2015   Morbid obesity with BMI of 50 to 60 07/23/2009   Obesity hypoventilation syndrome ([place]) 10/30/2012   Obesity, morbid ([place]) 01/10/2014   OSA on CPAP     AHI was on  11-17-12 to 120/hr, titrated to 15  cm with 3 cm EPR/    PCOS (polycystic ovarian syndrome)    Rheumatoid arthritis (HCC)    Right ankle pain 06/19/2015   Right hip pain 09/04/2017   Right shoulder pain 06/01/2017   Seasonal allergies    Sleep apnea, obstructive    uses CPAP nightly - had sleep study 08/22/2007   Past Surgical History:  Procedure Laterality Date   CARPAL TUNNEL RELEASE Right 10/02/2012   Procedure: RIGHT CARPAL TUNNEL RELEASE ENDOSCOPIC;  Surgeon: Jolyn Nap, MD;  Location: [facility];  Service: Orthopedics;  Laterality: Right;   CHOLECYSTECTOMY N/A 12/02/2014   Procedure: LAPAROSCOPIC CHOLECYSTECTOMY;  Surgeon: Armandina Gemma, MD;  Location: [facility];  Service: General;  Laterality:  N/A;   HYSTEROSCOPY WITH D & C  01-08-2002   FAMILY HISTORY Family History  Problem Relation Age of Onset   Other Mother    Diabetes Maternal Aunt    Heart attack Maternal Aunt    Alzheimer's disease Maternal Uncle    Cancer Maternal Uncle        prostate   Diabetes Paternal Aunt    Cancer Paternal Aunt        brain,mouth   Alzheimer's disease Paternal Aunt    Colon polyps Paternal Uncle    Diabetes Paternal Uncle    Diabetes Maternal Grandmother    Diabetes Maternal Grandfather    Leukemia Cousin    Other Other    Obesity Other    Other Other    Colitis Neg Hx    SOCIAL HISTORY Social History   Tobacco Use   Smoking status: Former    Years: 10.00    Types: Cigarettes    Quit date: 05/24/2010    Years since quitting: 10.8   Smokeless tobacco: Never  Vaping Use   Vaping Use: Never used  Substance Use Topics   Alcohol use: Yes    Alcohol/week: 0.0 standard drinks    Comment: socially   Drug use: No       OPHTHALMIC EXAM: Not recorded    IMAGING AND PROCEDURES  Imaging and Procedures for _0 @          ASSESSMENT/PLAN:  No diagnosis found.   1. Moderate non-proliferative diabetic retinopathy w/ DME, OU  - pt lost to f/u from 10.23.2019 to 11.18.20  - FA in 2019 showed  leaking MA OU; no NV OU  **of note, pt had pruritic rxn to fluorescein dye -- resolved with 12.5 mg IV benadryl**  - FA 12.16.2020 shows late leaking MA's, non-perfusion defects; no NV OU  - s/p IVA OD #1 (12.16.20), #2 (01.25.21), #3 (05.26.21), #4 (07.02.21), #5 (10.15.21), #6 (11.23.21), #7 (07.27.22), #8 (09.09.22), #9 (11.11.22)  - s/p focal laser OD (03.19.21), (04.01.22)  - s/p IVA OS #1 OS (12.23.20), #2 (01.27.21), #3 (03.02.21), #4 (05.26.21), #5 (07.02.21), #6 (10.15.21), #7 (11.23.21), #8 (01.11.22), #9 (03.04.22), #10 (04.27.22), #11 (11.14.22), #12 (01.10.23)  - exam shows scattered MA, IRH, exudates and mild macular edema OU  - OCT shows OD: Interval improvement in IRF/IRHM; OS: Mild interval increase in IRF/cystic changes and IRF inferior and nasal fovea and macula  - BCVA 20/25 OU             - Recommend IVA OD #10 today (01.18.23)  - pt wishes to proceed with IVA OS - RBA of procedure discussed, questions answered - see procedure note  - [place] informed consent form signed and scanned on 01.27.2021  - f/u 1 wks -- DFE/OCT, possible injxn OD  - f/u 6 weeks, DFE/OCT, possible injxn OS  2,3. Hypertensive retinopathy OU  - discussed importance of tight BP control  - monitor   4. Nuclear Sclerosis   - The symptoms of cataract, surgical options, and treatments and risks were discussed with patient.  - discussed diagnosis and progression  - under the expert management of Luis M. Cintron Ordered this visit:  No orders of the defined types were placed in this encounter.     No follow-ups on file.  There are no Patient Instructions on file for this visit.  Explained the diagnoses, plan, and follow up with the patient and they expressed understanding.  Patient expressed understanding of the importance of  proper follow up care.  This document serves as a record of services personally performed by Gardiner Sleeper, MD, PhD. It was created on their behalf by  Orvan Falconer, an ophthalmic technician. The creation of this record is the provider's dictation and/or activities during the visit.    Electronically signed by: Orvan Falconer, OA, 03/09/21  10:55 AM    Gardiner Sleeper, M.D., Ph.D. Diseases & Surgery of the Retina and Vitreous [facility]  I have reviewed the above documentation for accuracy and completeness, and I agree with the above. Gardiner Sleeper, M.D., Ph.D. 03/04/21 10:55 AM   Abbreviations: M myopia (nearsighted); A astigmatism; H hyperopia (farsighted); P presbyopia; Mrx spectacle prescription;  CTL contact lenses; OD right eye; OS left eye; OU both eyes  XT exotropia; ET esotropia; PEK punctate epithelial keratitis; PEE punctate epithelial erosions; DES dry eye syndrome; MGD meibomian gland dysfunction; ATs artificial tears; PFAT's preservative free artificial tears; [place] nuclear sclerotic cataract; PSC posterior subcapsular cataract; ERM epi-retinal membrane; PVD posterior vitreous detachment; RD retinal detachment; DM diabetes mellitus; DR diabetic retinopathy; NPDR non-proliferative diabetic retinopathy; PDR proliferative diabetic retinopathy; CSME clinically significant macular edema; DME diabetic macular edema; dbh dot blot hemorrhages; CWS cotton wool spot; POAG primary open angle glaucoma; C/D cup-to-disc ratio; HVF humphrey visual field; GVF goldmann visual field; OCT optical coherence tomography; IOP intraocular pressure; BRVO Branch retinal vein occlusion; CRVO central retinal vein occlusion; CRAO central retinal artery occlusion; BRAO branch retinal artery occlusion; RT retinal tear; SB scleral buckle; PPV pars plana vitrectomy; VH Vitreous hemorrhage; PRP panretinal laser photocoagulation; IVK intravitreal kenalog; VMT vitreomacular traction; MH Macular hole;  NVD neovascularization of the disc; NVE neovascularization elsewhere; AREDS age related eye disease study; ARMD age related macular  degeneration; POAG primary open angle glaucoma; EBMD epithelial/anterior basement membrane dystrophy; ACIOL anterior chamber intraocular lens; IOL intraocular lens; PCIOL posterior chamber intraocular lens; Phaco/IOL phacoemulsification with intraocular lens placement; [place] photorefractive keratectomy; LASIK laser assisted in situ keratomileusis; HTN hypertension; DM diabetes mellitus; COPD chronic obstructive pulmonary disease

## 2021-03-11 ENCOUNTER — Encounter (INDEPENDENT_AMBULATORY_CARE_PROVIDER_SITE_OTHER): Payer: Medicaid Other | Admitting: Ophthalmology

## 2021-03-11 DIAGNOSIS — H35033 Hypertensive retinopathy, bilateral: Secondary | ICD-10-CM

## 2021-03-11 DIAGNOSIS — E113313 Type 2 diabetes mellitus with moderate nonproliferative diabetic retinopathy with macular edema, bilateral: Secondary | ICD-10-CM

## 2021-03-11 DIAGNOSIS — I10 Essential (primary) hypertension: Secondary | ICD-10-CM

## 2021-03-11 DIAGNOSIS — H2513 Age-related nuclear cataract, bilateral: Secondary | ICD-10-CM

## 2021-03-12 NOTE — Progress Notes (Signed)
[facility] Note  03/13/2021     CHIEF COMPLAINT Patient presents for Retina Follow Up  HISTORY OF PRESENT ILLNESS: Ana Long is a 46 y.o. female who presents to the clinic today for:   HPI     Retina Follow Up   Patient presents with  Diabetic Retinopathy.  In both eyes.  Severity is moderate.  Duration of 10 days.  Since onset it is stable.  I, the attending physician,  performed the HPI with the patient and updated documentation appropriately.        Comments   Patient states no change in vision. BS yesterday am was around 160. Last a1c was 8.9, checked 2 weeks ago.       Last edited by Ana Caffey, MD on 03/13/2021  9:52 AM.      Referring physician: [name] MD [place], [place] [address]  HISTORICAL INFORMATION:   Selected notes from the [facility] Re-referred by Dr. Wyatt Long for DM exam   CURRENT MEDICATIONS: Current Outpatient Medications (Ophthalmic Drugs)  Medication Sig   Olopatadine HCl 0.2 % SOLN Place one drop into both eyes daily.   No current facility-administered medications for this visit. (Ophthalmic Drugs)   Current Outpatient Medications (Other)  Medication Sig   acyclovir (ZOVIRAX) 400 MG tablet TAKE 1 TABLET BY MOUTH TWICE A DAY   albuterol (PROAIR HFA) 108 (90 Base) MCG/ACT inhaler Inhale 2 puffs into the lungs every 4 (four) hours as needed for wheezing or shortness of breath (cough). (Patient taking differently: Inhale 2 puffs into the lungs every 4 (four) hours as needed for wheezing or shortness of breath (or coughing).)   atorvastatin (LIPITOR) 10 MG tablet Take 10 mg by mouth daily.   benzonatate (TESSALON) 200 MG capsule Take 1 capsule (200 mg total) by mouth 3 (three) times daily as needed for cough.   Blood Glucose Monitoring Suppl (ACCU-CHEK AVIVA PLUS) w/Device KIT Please check you blood sugars 4 times a day   DERMA-SMOOTHE/FS SCALP 0.01 % OIL Apply topically.    fluconazole (DIFLUCAN) 150 MG tablet TAKE 1 TABLET BY MOUTH NOW. THEN REPEAT IN 1 WEEK   furosemide (LASIX) 20 MG tablet Take 20 mg by mouth daily as needed for fluid or edema.   glucose blood (ACCU-CHEK SMARTVIEW) test strip TEST FOUR TIMES DAILY   HUMULIN R U-500 KWIKPEN 500 UNIT/ML kwikpen Inject into the skin.   hydrochlorothiazide (HYDRODIURIL) 12.5 MG tablet Take by mouth.   Insulin Pen Needle (B-D UF III MINI PEN NEEDLES) 31G X 5 MM MISC USE AS DIRECTED TO INJECT INSULIN FIVE A DAY E11.65   lactulose (CEPHULAC) 10 g packet Take 1 packet (10 g total) by mouth daily as needed. Reported on 07/29/2015 (Patient taking differently: Take 10 g by mouth daily as needed (for constipation).)   Lancets (ACCU-CHEK SOFT TOUCH) lancets Use as instructed   loratadine (CLARITIN) 10 MG tablet Take 1 tablet (10 mg total) by mouth 2 (two) times daily.   mometasone-formoterol (DULERA) 200-5 MCG/ACT AERO Inhale 2 puffs into the lungs 2 (two) times daily.   montelukast (SINGULAIR) 10 MG tablet Take 1 tablet (10 mg total) by mouth at bedtime.   nitroGLYCERIN (NITRODUR - DOSED IN MG/24 HR) 0.2 mg/hr patch Apply 1/4th patch to affected achilles, change daily   omeprazole (PRILOSEC) 20 MG capsule Take 1 capsule (20 mg total) by mouth daily before breakfast.   SPIRIVA RESPIMAT 1.25 MCG/ACT AERS  SMARTSIG:2 Puff(s) Via Inhaler Daily   topiramate (TOPAMAX) 25 MG tablet Take by mouth.   methocarbamol (ROBAXIN) 500 MG tablet Take 1 tablet (500 mg total) by mouth every 8 (eight) hours as needed. (Patient not taking: Reported on 01/05/2021)   Current Facility-Administered Medications (Other)  Medication Route   diphenhydrAMINE (BENADRYL) injection 12.5 mg Intramuscular   REVIEW OF SYSTEMS: ROS   Positive for: Gastrointestinal, Endocrine, Eyes, Respiratory Negative for: Constitutional, Neurological, Skin, Genitourinary, Musculoskeletal, HENT, Cardiovascular, Psychiatric, Allergic/Imm, Heme/Lymph Last edited by Ana Long, COT on 03/13/2021  9:37 AM.      ALLERGIES Allergies  Allergen Reactions   Bee Venom Anaphylaxis and Swelling   Wasp Venom Anaphylaxis and Swelling   Citrus Hives and Itching   Latex Hives and Swelling   Penicillins Hives and Itching    Has patient had a PCN reaction causing immediate rash, facial/tongue/throat swelling, SOB or lightheadedness with hypotension: Yes Has patient had a PCN reaction causing severe rash involving mucus membranes or skin necrosis: Unk Has patient had a PCN reaction that required hospitalization: Was already in hosp Has patient had a PCN reaction occurring within the last 10 years: No If all of the above answers are "NO", then may proceed with Cephalosporin use.    Shellfish Allergy Other (See Comments) and Swelling    Patient is uncertain; stated that she tolerates this (??)   Soap Hives, Itching and Swelling    PUREX LAUNDRY DETERGENT   Tomato Hives and Itching   Banana Itching and Nausea Only   Chocolate Itching   Fluticasone Other (See Comments)    Per patient, makes sneeze and gag   Hydrocodone Itching and Nausea Only   Other Itching    Acidic foods- Itching [place]- Itching in the roof of the mouth   Adhesive [Tape] Itching and Rash    Allergic to  "leads"   Doxycycline Other (See Comments)    AFFECTS JOINTS   Liraglutide Nausea Only   Metformin And Related Nausea Only, Rash, Other (See Comments) and Diarrhea    GI UPSET, also   Red Dye Itching, Rash and Other (See Comments)    At eye doctor's office, the dilating drops- Skin breaks out, chest turned red, itching, face became swollen   PAST MEDICAL HISTORY 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   Cataract    NS OU   Complication of  anesthesia    states is hard to wake up post-op   Constipation 12/30/2014   Diabetes mellitus    intolerant to Metformin   Diabetic retinopathy ([place])    NPDR OU   Dry skin    hands    Family history of anesthesia complication    mother went into coma during c-section   Genital herpes 01/01/2006   on acyclovir BID    GERD (gastroesophageal reflux disease)    Herpes genital    Hyperglycemia 12/26/2017   Hypertensive retinopathy    OU   Low back pain 11/22/2012   Lower extremity edema 10/18/2012   Menorrhagia 11/06/2015   Morbid obesity with BMI of 50 to 60 07/23/2009   Obesity hypoventilation syndrome ([place]) 10/30/2012   Obesity, morbid ([place]) 01/10/2014   OSA on CPAP  AHI was on  11-17-12 to 120/hr, titrated to 15 cm with 3 cm EPR/    PCOS (polycystic ovarian syndrome)    Rheumatoid arthritis ([place])    Right ankle pain 06/19/2015   Right hip pain 09/04/2017   Right shoulder pain 06/01/2017   Seasonal allergies    Sleep apnea, obstructive    uses CPAP nightly - had sleep study 08/22/2007   Past Surgical History:  Procedure Laterality Date   CARPAL TUNNEL RELEASE Right 10/02/2012   Procedure: RIGHT CARPAL TUNNEL RELEASE ENDOSCOPIC;  Surgeon: Jolyn Nap, MD;  Location: [facility];  Service: Orthopedics;  Laterality: Right;   CHOLECYSTECTOMY N/A 12/02/2014   Procedure: LAPAROSCOPIC CHOLECYSTECTOMY;  Surgeon: Armandina Gemma, MD;  Location: [facility];  Service: General;  Laterality: N/A;   HYSTEROSCOPY WITH D & C  01-08-2002   FAMILY HISTORY Family History  Problem Relation Age of Onset   Other Mother    Diabetes Maternal Aunt    Heart attack Maternal Aunt    Alzheimer's disease Maternal Uncle    Cancer Maternal Uncle        prostate   Diabetes Paternal Aunt    Cancer Paternal Aunt        brain,mouth   Alzheimer's disease Paternal Aunt    Colon polyps Paternal Uncle    Diabetes Paternal Uncle    Diabetes Maternal Grandmother    Diabetes Maternal Grandfather     Leukemia Cousin    Other Other    Obesity Other    Other Other    Colitis Neg Hx    SOCIAL HISTORY Social History   Tobacco Use   Smoking status: Former    Years: 10.00    Types: Cigarettes    Quit date: 05/24/2010    Years since quitting: 10.8   Smokeless tobacco: Never  Vaping Use   Vaping Use: Never used  Substance Use Topics   Alcohol use: Yes    Alcohol/week: 0.0 standard drinks    Comment: socially   Drug use: No       OPHTHALMIC EXAM: Base Eye Exam     Visual Acuity (Snellen - Linear)       Right Left   Dist cc 20/30 -2 20/30 -2   Dist ph cc 20/25 +2 20/20 -2         Tonometry (Tonopen, 9:45 AM)       Right Left   Pressure 23 23         Pupils       Dark Light Shape React APD   Right 3 2 Round Brisk None   Left 3 2 Round Brisk None         Visual Fields (Counting fingers)       Left Right    Full Full         Extraocular Movement       Right Left    Full, Ortho Full, Ortho         Neuro/Psych     Oriented x3: Yes   Mood/Affect: Normal         Dilation     Right eye: 1.0% Mydriacyl, 2.5% Phenylephrine @ 9:45 AM           Slit Lamp and Fundus Exam     Slit Lamp Exam       Right Left   Lids/Lashes Dermatochalasis - upper lid, mild Meibomian gland dysfunction Dermatochalasis - upper lid, mild Meibomian gland dysfunction   Conjunctiva/Sclera  White and quiet White and quiet   Cornea 1-2+Punctate epithelial erosions 2-3+fine Punctate epithelial erosions   Anterior Chamber deep and quiet deep and quiet   Iris round and dilated, No NVI round and dilated   Lens 1+ NS, 1+CC 1+NS, 1+CC   Anterior Vitreous mild vitreous syneresis mild vitreous syneresis, scattered vitreous condensations         Fundus Exam       Right Left   Disc Pink and sharp, +cupping Pink and sharp, +cupping   C/D Ratio 0.7 0.6   Macula good foveal reflex, focal exudates inferior to fovea and scattered superior macula, scattered MA, focal cystic  changes IN macula - persistent, focal laser changes inferior macula good foveal reflex, scatttered MA's and exudate, central cystic changes -- slightly improved, focal cluster of exudates temporal macula -  persistent, small cluster of MA central macula   Vessels attenuated, Tortuous Vascular attenuation, Tortuous, Copper wiring   Periphery Attached, scattered MA/exudates greatest nasally Attached, scattered MA and exudate greatest superior to disc, white without pressure temporally           IMAGING AND PROCEDURES  Imaging and Procedures for @TODAY @  OCT, Retina - OU - Both Eyes       Right Eye Quality was good. Central Foveal Thickness: 227. Progression has been stable. Findings include no SRF, intraretinal fluid, intraretinal hyper-reflective material, vitreomacular adhesion , abnormal foveal contour (Interval improvement in IRF/IRHM).   Left Eye Quality was good. Central Foveal Thickness: 260. Progression has been stable. Findings include intraretinal fluid, no SRF, abnormal foveal contour, intraretinal hyper-reflective material ( Persistent IRF and mild interval improvement in central cystic changes).   Notes *Images captured and stored on drive  Diagnosis / Impression:  +DME OU OD: persistent IRF/IRHM OS: persistent IRF and mild interval improvement in central cystic changes  Clinical management:  See below  Abbreviations: NFP - Normal foveal profile. CME - cystoid macular edema. PED - pigment epithelial detachment. IRF - intraretinal fluid. SRF - subretinal fluid. EZ - ellipsoid zone. ERM - epiretinal membrane. ORA - outer retinal atrophy. ORT - outer retinal tubulation. SRHM - subretinal hyper-reflective material      Intravitreal Injection, Pharmacologic Agent - OD - Right Eye       Time Out 03/13/2021. 9:53 AM. Confirmed correct patient, procedure, site, and patient consented.   Anesthesia Topical anesthesia was used. Anesthetic medications included Lidocaine 2%,  Proparacaine 0.5%.   Procedure Preparation included 5% betadine to ocular surface, eyelid speculum. A (32g) needle was used.   Injection: 1.25 mg Bevacizumab 1.23m/0.05ml   Route: Intravitreal, Site: Right Eye   NDC: 50242-060-01, Lot:: [id number] Expiration date: 04/02/2021, Waste: 0.05 mL   Post-op Post injection exam found visual acuity of at least counting fingers. The patient tolerated the procedure well. There were no complications. The patient received written and verbal post procedure care education. Post injection medications were not given.             ASSESSMENT/PLAN:    ICD-10-CM   1. Moderate nonproliferative diabetic retinopathy of both eyes with macular edema associated with type 2 diabetes mellitus (HCC)  E11.3313 OCT, Retina - OU - Both Eyes    Intravitreal Injection, Pharmacologic Agent - OD - Right Eye    Bevacizumab (AVASTIN) SOLN 1.25 mg    2. Essential hypertension  I10     3. Hypertensive retinopathy of both eyes  H35.033     4. Nuclear sclerosis of both eyes  H25.13  1.  Moderate non-proliferative diabetic retinopathy w/ DME, OU  - pt lost to f/u from 10.23.2019 to 11.18.20  - FA in 2019 showed leaking MA OU; no NV OU  **of note, pt had pruritic rxn to fluorescein dye -- resolved with 12.5 mg IV benadryl**  - FA 12.16.2020 shows late leaking MA's, non-perfusion defects; no NV OU  - s/p IVA OD #1 (12.16.20), #2 (01.25.21), #3 (05.26.21), #4 (07.02.21), #5 (10.15.21), #6 (11.23.21), #7 (07.27.22), #8 (09.09.22), #9 (11.11.22)  - s/p focal laser OD (03.19.21), (04.01.22)  - s/p IVA OS #1 OS (12.23.20), #2 (01.27.21), #3 (03.02.21), #4 (05.26.21), #5 (07.02.21), #6 (10.15.21), #7 (11.23.21), #8 (01.11.22), #9 (03.04.22), #10 (04.27.22), #11 (11.14.22), #12 (01.10.23)  - exam shows scattered MA, IRH, exudates and mild macular edema OU  - OCT shows OD: persistent IRF/IRHM; OS: persistent IRF and mild interval improvement in central cystic changes  - BCVA  OD: 20/25+2, OS: 20/20-2, stable OU             - Recommend IVA OD #10 today, 01.20.23  - pt wishes to proceed with IVA OD - RBA of procedure discussed, questions answered - see procedure note   - Avastin informed consent form signed and scanned on 01.27.2021  - f/u 2.20.23 as scheduled -- DFE/OCT, possible injxn OS  2,3. Hypertensive retinopathy OU  - discussed importance of tight BP control  - monitor   4.    Nuclear Sclerosis   - The symptoms of cataract, surgical options, and treatments and risks were discussed with patient.   - discussed diagnosis and progression  - under the expert management of [facility] Ordered this visit:  Meds ordered this encounter  Medications   Bevacizumab (AVASTIN) SOLN 1.25 mg      Return in 1 month (on 04/13/2021) for F/u as scheduled.  There are no Patient Instructions on file for this visit.  Explained the diagnoses, plan, and follow up with the patient and they expressed understanding.  Patient expressed understanding of the importance of proper follow up care.  This document serves as a record of services personally performed by Gardiner Sleeper, MD, PhD. It was created on their behalf by Leonie Douglas, an ophthalmic technician. The creation of this record is the provider's dictation and/or activities during the visit.    Electronically signed by: Leonie Douglas COA, 03/13/21  10:51 AM  This document serves as a record of services personally performed by Gardiner Sleeper, MD, PhD. It was created on their behalf by Roselee Nova, COMT. The creation of this record is the provider's dictation and/or activities during the visit.  Electronically signed by: Roselee Nova, COMT 03/13/21 10:51 AM  Gardiner Sleeper, M.D., Ph.D. Diseases & Surgery of the Retina and [place] 03/13/2021  I have reviewed the above documentation for accuracy and completeness, and I agree with the above. Gardiner Sleeper,  M.D., Ph.D. 03/13/21 10:51 AM  Abbreviations: M myopia (nearsighted); A astigmatism; H hyperopia (farsighted); P presbyopia; Mrx spectacle prescription;  CTL contact lenses; OD right eye; OS left eye; OU both eyes  XT exotropia; ET esotropia; PEK punctate epithelial keratitis; PEE punctate epithelial erosions; DES dry eye syndrome; MGD meibomian gland dysfunction; ATs artificial tears; PFAT's preservative free artificial tears; [place] nuclear sclerotic cataract; PSC posterior subcapsular cataract; ERM epi-retinal membrane; PVD posterior vitreous detachment; RD retinal detachment; DM diabetes mellitus; DR diabetic retinopathy; NPDR non-proliferative diabetic retinopathy; PDR proliferative diabetic retinopathy; CSME  clinically significant macular edema; DME diabetic macular edema; dbh dot blot hemorrhages; CWS cotton wool spot; POAG primary open angle glaucoma; C/D cup-to-disc ratio; HVF humphrey visual field; GVF goldmann visual field; OCT optical coherence tomography; IOP intraocular pressure; BRVO Branch retinal vein occlusion; CRVO central retinal vein occlusion; CRAO central retinal artery occlusion; BRAO branch retinal artery occlusion; RT retinal tear; SB scleral buckle; PPV pars plana vitrectomy; VH Vitreous hemorrhage; PRP panretinal laser photocoagulation; IVK intravitreal kenalog; VMT vitreomacular traction; MH Macular hole;  NVD neovascularization of the disc; NVE neovascularization elsewhere; AREDS age related eye disease study; ARMD age related macular degeneration; POAG primary open angle glaucoma; EBMD epithelial/anterior basement membrane dystrophy; ACIOL anterior chamber intraocular lens; IOL intraocular lens; PCIOL posterior chamber intraocular lens; Phaco/IOL phacoemulsification with intraocular lens placement; [place] photorefractive keratectomy; LASIK laser assisted in situ keratomileusis; HTN hypertension; DM diabetes mellitus; COPD chronic obstructive pulmonary disease

## 2021-03-13 ENCOUNTER — Other Ambulatory Visit: Payer: Self-pay

## 2021-03-13 ENCOUNTER — Ambulatory Visit (INDEPENDENT_AMBULATORY_CARE_PROVIDER_SITE_OTHER): Payer: Medicaid Other | Admitting: Ophthalmology

## 2021-03-13 ENCOUNTER — Encounter (INDEPENDENT_AMBULATORY_CARE_PROVIDER_SITE_OTHER): Payer: Self-pay | Admitting: Ophthalmology

## 2021-03-13 DIAGNOSIS — H2513 Age-related nuclear cataract, bilateral: Secondary | ICD-10-CM

## 2021-03-13 DIAGNOSIS — H35033 Hypertensive retinopathy, bilateral: Secondary | ICD-10-CM | POA: Diagnosis not present

## 2021-03-13 DIAGNOSIS — I10 Essential (primary) hypertension: Secondary | ICD-10-CM

## 2021-03-13 DIAGNOSIS — E113313 Type 2 diabetes mellitus with moderate nonproliferative diabetic retinopathy with macular edema, bilateral: Secondary | ICD-10-CM | POA: Diagnosis not present

## 2021-03-13 MED ORDER — BEVACIZUMAB CHEMO INJECTION 1.25MG/0.05ML SYRINGE FOR KALEIDOSCOPE
1.2500 mg | INTRAVITREAL | Status: AC | PRN
  Administered 2021-03-13: 1.25 mg via INTRAVITREAL

## 2021-03-24 LAB — COLOGUARD: COLOGUARD: NEGATIVE

## 2021-03-26 ENCOUNTER — Telehealth: Payer: Self-pay

## 2021-03-26 NOTE — Telephone Encounter (Signed)
Left a message to return call for results.

## 2021-03-26 NOTE — Telephone Encounter (Signed)
-----   Message from Doran Stabler, MD sent at 03/24/2021 10:17 AM EST ----- Please let her know the Cologuard test is negative. (Patient does not appear to be on MyChart)  Set a recall for 3 years.  HD

## 2021-03-31 NOTE — Telephone Encounter (Signed)
Patient has been advised of negative cologard. Recall has been placed in Epic for 02/2024.

## 2021-04-10 NOTE — Progress Notes (Signed)
[facility] Note  04/13/2021     CHIEF COMPLAINT Patient presents for Retina Follow Up  HISTORY OF PRESENT ILLNESS: Ana Long is a 46 y.o. female who presents to the clinic today for:   HPI     Retina Follow Up   Patient presents with  Diabetic Retinopathy.  In both eyes.  This started 6 weeks ago.  I, the attending physician,  performed the HPI with the patient and updated documentation appropriately.        Comments   Patient here for 6 weeks retina follow up for NPDR OU. Patient states vision is ok. Still has one dot that comes up. No eye pain.       Last edited by Bernarda Caffey, MD on 04/15/2021  1:55 AM.    Pt still has a floater in her left eye   Referring physician: [name] MD [place], [place] [address]  HISTORICAL INFORMATION:   Selected notes from the [facility] Re-referred by Dr. [name] for DM exam   CURRENT MEDICATIONS: Current Outpatient Medications (Ophthalmic Drugs)  Medication Sig   Olopatadine HCl 0.2 % SOLN Place one drop into both eyes daily.   No current facility-administered medications for this visit. (Ophthalmic Drugs)   Current Outpatient Medications (Other)  Medication Sig   acyclovir (ZOVIRAX) 400 MG tablet TAKE 1 TABLET BY MOUTH TWICE A DAY   albuterol (PROAIR HFA) 108 (90 Base) MCG/ACT inhaler Inhale 2 puffs into the lungs every 4 (four) hours as needed for wheezing or shortness of breath (cough). (Patient taking differently: Inhale 2 puffs into the lungs every 4 (four) hours as needed for wheezing or shortness of breath (or coughing).)   atorvastatin (LIPITOR) 10 MG tablet Take 10 mg by mouth daily.   benzonatate (TESSALON) 200 MG capsule Take 1 capsule (200 mg total) by mouth 3 (three) times daily as needed for cough.   Blood Glucose Monitoring Suppl (ACCU-CHEK AVIVA PLUS) w/Device KIT Please check you blood sugars 4 times a day   DERMA-SMOOTHE/FS SCALP 0.01 %  OIL Apply topically.   fluconazole (DIFLUCAN) 150 MG tablet TAKE 1 TABLET BY MOUTH NOW. THEN REPEAT IN 1 WEEK   furosemide (LASIX) 20 MG tablet Take 20 mg by mouth daily as needed for fluid or edema.   glucose blood (ACCU-CHEK SMARTVIEW) test strip TEST FOUR TIMES DAILY   HUMULIN R U-500 KWIKPEN 500 UNIT/ML kwikpen Inject into the skin.   hydrochlorothiazide (HYDRODIURIL) 12.5 MG tablet Take by mouth.   Insulin Pen Needle (B-D UF III MINI PEN NEEDLES) 31G X 5 MM MISC USE AS DIRECTED TO INJECT INSULIN FIVE A DAY E11.65   lactulose (CEPHULAC) 10 g packet Take 1 packet (10 g total) by mouth daily as needed. Reported on 07/29/2015 (Patient taking differently: Take 10 g by mouth daily as needed (for constipation).)   Lancets (ACCU-CHEK SOFT TOUCH) lancets Use as instructed   loratadine (CLARITIN) 10 MG tablet Take 1 tablet (10 mg total) by mouth 2 (two) times daily.   mometasone-formoterol (DULERA) 200-5 MCG/ACT AERO Inhale 2 puffs into the lungs 2 (two) times daily.   montelukast (SINGULAIR) 10 MG tablet Take 1 tablet (10 mg total) by mouth at bedtime.   nitroGLYCERIN (NITRODUR - DOSED IN MG/24 HR) 0.2 mg/hr patch Apply 1/4th patch to affected achilles, change daily   omeprazole (PRILOSEC) 20 MG capsule Take 1 capsule (20 mg total) by mouth daily before breakfast.  SPIRIVA RESPIMAT 1.25 MCG/ACT AERS SMARTSIG:2 Puff(s) Via Inhaler Daily   topiramate (TOPAMAX) 25 MG tablet Take by mouth.   CIPRODEX OTIC suspension Place into the right ear.   methocarbamol (ROBAXIN) 500 MG tablet Take 1 tablet (500 mg total) by mouth every 8 (eight) hours as needed. (Patient not taking: Reported on 01/05/2021)   Current Facility-Administered Medications (Other)  Medication Route   diphenhydrAMINE (BENADRYL) injection 12.5 mg Intramuscular   REVIEW OF SYSTEMS: ROS   Positive for: Gastrointestinal, Endocrine, Eyes, Respiratory Negative for: Constitutional, Neurological, Skin, Genitourinary, Musculoskeletal, HENT,  Cardiovascular, Psychiatric, Allergic/Imm, Heme/Lymph Last edited by Theodore Demark, COA on 04/13/2021  1:24 PM.     ALLERGIES Allergies  Allergen Reactions   Bee Venom Anaphylaxis and Swelling   Wasp Venom Anaphylaxis and Swelling   Citrus Hives and Itching   Latex Hives and Swelling   Penicillins Hives and Itching    Has patient had a PCN reaction causing immediate rash, facial/tongue/throat swelling, SOB or lightheadedness with hypotension: Yes Has patient had a PCN reaction causing severe rash involving mucus membranes or skin necrosis: Unk Has patient had a PCN reaction that required hospitalization: Was already in hosp Has patient had a PCN reaction occurring within the last 10 years: No If all of the above answers are "NO", then may proceed with Cephalosporin use.    Shellfish Allergy Other (See Comments) and Swelling    Patient is uncertain; stated that she tolerates this (??)   Soap Hives, Itching and Swelling    PUREX LAUNDRY DETERGENT   Tomato Hives and Itching   Banana Itching and Nausea Only   Chocolate Itching   Fluticasone Other (See Comments)    Per patient, makes sneeze and gag   Hydrocodone Itching and Nausea Only   Other Itching    Acidic foods- Itching [place]- Itching in the roof of the mouth   Adhesive [Tape] Itching and Rash    Allergic to  "leads"   Doxycycline Other (See Comments)    AFFECTS JOINTS   Liraglutide Nausea Only   Metformin And Related Nausea Only, Rash, Other (See Comments) and Diarrhea    GI UPSET, also   Red Dye Itching, Rash and Other (See Comments)    At eye doctor's office, the dilating drops- Skin breaks out, chest turned red, itching, face became swollen   PAST MEDICAL HISTORY Past Medical History:  Diagnosis Date   Acute nonintractable headache 12/26/2017   Arthritis    major joints   Asthma    prn inhaler   Asthma exacerbation 07/25/2013   Asthma in adult, severe persistent, with acute exacerbation 01/11/2018   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   Cataract    NS OU   Complication of anesthesia    states is hard to wake up post-op   Constipation 12/30/2014   Diabetes mellitus    intolerant to Metformin   Diabetic retinopathy ([place])    NPDR OU   Dry skin    hands    Family history of anesthesia complication    mother went into coma during c-section   Genital herpes 01/01/2006   on acyclovir BID    GERD (gastroesophageal reflux disease)    Herpes genital    Hyperglycemia 12/26/2017   Hypertensive retinopathy    OU   Low back pain 11/22/2012   Lower extremity edema 10/18/2012   Menorrhagia 11/06/2015   Morbid obesity with BMI of 50 to 60 07/23/2009   Obesity hypoventilation syndrome ([place])  10/30/2012   Obesity, morbid ([place]) 01/10/2014   OSA on CPAP     AHI was on  11-17-12 to 120/hr, titrated to 15 cm with 3 cm EPR/    PCOS (polycystic ovarian syndrome)    Rheumatoid arthritis ([place])    Right ankle pain 06/19/2015   Right hip pain 09/04/2017   Right shoulder pain 06/01/2017   Seasonal allergies    Sleep apnea, obstructive    uses CPAP nightly - had sleep study 08/22/2007   Past Surgical History:  Procedure Laterality Date   CARPAL TUNNEL RELEASE Right 10/02/2012   Procedure: RIGHT CARPAL TUNNEL RELEASE ENDOSCOPIC;  Surgeon: Jolyn Nap, MD;  Location: [facility];  Service: Orthopedics;  Laterality: Right;   CHOLECYSTECTOMY N/A 12/02/2014   Procedure: LAPAROSCOPIC CHOLECYSTECTOMY;  Surgeon: Armandina Gemma, MD;  Location: [facility];  Service: General;  Laterality: N/A;   HYSTEROSCOPY WITH D & C  01-08-2002   FAMILY HISTORY Family History  Problem Relation Age of Onset   Other Mother    Diabetes Maternal Aunt    Heart attack Maternal Aunt    Alzheimer's disease Maternal Uncle    Cancer Maternal Uncle        prostate   Diabetes Paternal Aunt    Cancer Paternal Aunt        brain,mouth   Alzheimer's disease Paternal Aunt    Colon polyps Paternal Uncle    Diabetes Paternal  Uncle    Diabetes Maternal Grandmother    Diabetes Maternal Grandfather    Leukemia Cousin    Other Other    Obesity Other    Other Other    Colitis Neg Hx    SOCIAL HISTORY Social History   Tobacco Use   Smoking status: Former    Years: 10.00    Types: Cigarettes    Quit date: 05/24/2010    Years since quitting: 10.9   Smokeless tobacco: Never  Vaping Use   Vaping Use: Never used  Substance Use Topics   Alcohol use: Yes    Alcohol/week: 0.0 standard drinks    Comment: socially   Drug use: No       OPHTHALMIC EXAM: Base Eye Exam     Visual Acuity (Snellen - Linear)       Right Left   Dist cc 20/30 -2 20/30 -2   Dist ph cc 20/25 -2 20/20 -2    Correction: Glasses         Tonometry (Tonopen, 1:22 PM)       Right Left   Pressure 16 15         Pupils       Dark Light Shape React APD   Right 3 2 Round Brisk None   Left 3 2 Round Brisk None         Visual Fields (Counting fingers)       Left Right    Full Full         Extraocular Movement       Right Left    Full, Ortho Full, Ortho         Neuro/Psych     Oriented x3: Yes   Mood/Affect: Normal         Dilation     Both eyes: 1.0% Mydriacyl, 2.5% Phenylephrine @ 1:22 PM           Slit Lamp and Fundus Exam     Slit Lamp Exam       Right Left  Lids/Lashes Dermatochalasis - upper lid, mild Meibomian gland dysfunction Dermatochalasis - upper lid, mild Meibomian gland dysfunction   Conjunctiva/Sclera White and quiet White and quiet   Cornea 1-2+Punctate epithelial erosions 2-3+fine Punctate epithelial erosions   Anterior Chamber deep and quiet deep and quiet   Iris round and dilated, No NVI round and dilated   Lens 1+ NS, 1+CC 1+NS, 1+CC   Anterior Vitreous mild vitreous syneresis mild vitreous syneresis, scattered vitreous condensations         Fundus Exam       Right Left   Disc Pink and sharp, +cupping Pink and sharp, +cupping   C/D Ratio 0.7 0.6   Macula good  foveal reflex, focal exudates inferior to fovea and scattered superior macula, scattered MA, focal cystic changes IN macula - persistent, focal laser changes inferior macula good foveal reflex, scatttered MA's and exudate, central cystic changes -- slightly improved, focal cluster of exudates temporal macula -  persistent, small cluster of MA central macula   Vessels attenuated, Tortuous Vascular attenuation, Tortuous, Copper wiring   Periphery Attached, scattered MA/exudates greatest nasally Attached, scattered MA and exudate greatest superior to disc, white without pressure temporally           Refraction     Wearing Rx       Sphere Cylinder Axis   Right -2.50 +1.50 013   Left -2.25 Sphere     Type: SVL           IMAGING AND PROCEDURES  Imaging and Procedures for @TODAY @  OCT, Retina - OU - Both Eyes       Right Eye Quality was good. Central Foveal Thickness: 228. Progression has improved. Findings include no SRF, intraretinal fluid, intraretinal hyper-reflective material, vitreomacular adhesion , normal foveal contour (Interval improvement in cystic changes).   Left Eye Quality was good. Central Foveal Thickness: 258. Progression has improved. Findings include intraretinal fluid, no SRF, abnormal foveal contour, intraretinal hyper-reflective material (Mild improvement in cystic changes).   Notes *Images captured and stored on drive  Diagnosis / Impression:  +DME OU OD: Interval improvement in cystic changes OS: Mild improvement in cystic changes  Clinical management:  See below  Abbreviations: NFP - Normal foveal profile. CME - cystoid macular edema. PED - pigment epithelial detachment. IRF - intraretinal fluid. SRF - subretinal fluid. EZ - ellipsoid zone. ERM - epiretinal membrane. ORA - outer retinal atrophy. ORT - outer retinal tubulation. SRHM - subretinal hyper-reflective material      Intravitreal Injection, Pharmacologic Agent - OS - Left Eye        Time Out 04/13/2021. 2:04 PM. Confirmed correct patient, procedure, site, and patient consented.   Anesthesia Topical anesthesia was used. Anesthetic medications included Lidocaine 2%, Proparacaine 0.5%.   Procedure Preparation included 5% betadine to ocular surface, eyelid speculum. A supplied (32g) needle was used.   Injection: 1.25 mg Bevacizumab 1.70m/0.05ml   Route: Intravitreal, Site: Left Eye   NDC:: 40981-191-47 Lot: [id number] , Expiration date: 05/17/2021   Post-op Post injection exam found visual acuity of at least counting fingers. The patient tolerated the procedure well. There were no complications. The patient received written and verbal post procedure care education. Post injection medications were not given.            ASSESSMENT/PLAN:    ICD-10-CM   1. Moderate nonproliferative diabetic retinopathy of both eyes with macular edema associated with type 2 diabetes mellitus ([place]  [address] Retina - OU - Both Eyes  Intravitreal Injection, Pharmacologic Agent - OS - Left Eye    Bevacizumab (AVASTIN) SOLN 1.25 mg    2. Essential hypertension  I10     3. Hypertensive retinopathy of both eyes  H35.033     4. Nuclear sclerosis of both eyes  H25.13       1.  Moderate non-proliferative diabetic retinopathy w/ DME, OU  - pt lost to f/u from 10.23.2019 to 11.18.20  - FA in 2019 showed leaking MA OU; no NV OU  **of note, pt had pruritic rxn to fluorescein dye -- resolved with 12.5 mg IV benadryl**  - FA 12.16.2020 shows late leaking MA's, non-perfusion defects; no NV OU  - s/p IVA OD #1 (12.16.20), #2 (01.25.21), #3 (05.26.21), #4 (07.02.21), #5 (10.15.21), #6 (11.23.21), #7 (07.27.22), #8 (09.09.22), #9 (11.11.22), #10 (01.20.23)  - s/p focal laser OD (03.19.21), (04.01.22)  - s/p IVA OS #1 OS (12.23.20), #2 (01.27.21), #3 (03.02.21), #4 (05.26.21), #5 (07.02.21), #6 (10.15.21), #7 (11.23.21), #8 (01.11.22), #9 (03.04.22), #10 (04.27.22), #11 (11.14.22), #12  (01.10.23)  - exam shows scattered MA, IRH, exudates and mild macular edema OU  - OCT shows OD: Interval improvement in cystic changes; OS: Mild improvement in cystic changes at 6 wks  - BCVA OD: 20/25+2, OS: 20/20-2, stable OU             - Recommend IVA OS #13 today, 02.20.23  - pt wishes to proceed with IVA OS - RBA of procedure discussed, questions answered - see procedure note   - Avastin informed consent form signed and scanned on 01.27.2021  - f/u Thursday at 10:00  -- DFE/OCT, possible injxn OD; then f/u ~5-6 wks  2,3. Hypertensive retinopathy OU  - discussed importance of tight BP control  - monitor  4.    Nuclear Sclerosis   - The symptoms of cataract, surgical options, and treatments and risks were discussed with patient.   - discussed diagnosis and progression  - under the expert management of Arnold Ordered this visit:  Meds ordered this encounter  Medications   Bevacizumab (AVASTIN) SOLN 1.25 mg      Return for f/u Thursday at 10:00, NPDR OU, DFE, OCT.  There are no Patient Instructions on file for this visit.  Explained the diagnoses, plan, and follow up with the patient and they expressed understanding.  Patient expressed understanding of the importance of proper follow up care.  This document serves as a record of services personally performed by Gardiner Sleeper, MD, PhD. It was created on their behalf by Roselee Nova, COMT. The creation of this record is the provider's dictation and/or activities during the visit.  Electronically signed by: Roselee Nova, COMT 04/15/21 1:59 AM  This document serves as a record of services personally performed by Gardiner Sleeper, MD, PhD. It was created on their behalf by San Jetty. Owens Shark, OA an ophthalmic technician. The creation of this record is the provider's dictation and/or activities during the visit.    Electronically signed by: San Jetty. Owens Shark, [place] 02.20.2023 1:59 AM   Gardiner Sleeper, M.D.,  Ph.D. Diseases & Surgery of the Retina and Vitreous [facility]  I have reviewed the above documentation for accuracy and completeness, and I agree with the above. Gardiner Sleeper, M.D., Ph.D. 04/15/21 2:00 AM  Abbreviations: M myopia (nearsighted); A astigmatism; H hyperopia (farsighted); P presbyopia; Mrx spectacle prescription;  CTL contact lenses; OD right eye; OS left eye; OU  both eyes  XT exotropia; ET esotropia; PEK punctate epithelial keratitis; PEE punctate epithelial erosions; DES dry eye syndrome; MGD meibomian gland dysfunction; ATs artificial tears; PFAT's preservative free artificial tears; [place] nuclear sclerotic cataract; PSC posterior subcapsular cataract; ERM epi-retinal membrane; PVD posterior vitreous detachment; RD retinal detachment; DM diabetes mellitus; DR diabetic retinopathy; NPDR non-proliferative diabetic retinopathy; PDR proliferative diabetic retinopathy; CSME clinically significant macular edema; DME diabetic macular edema; dbh dot blot hemorrhages; CWS cotton wool spot; POAG primary open angle glaucoma; C/D cup-to-disc ratio; HVF humphrey visual field; GVF goldmann visual field; OCT optical coherence tomography; IOP intraocular pressure; BRVO Branch retinal vein occlusion; CRVO central retinal vein occlusion; CRAO central retinal artery occlusion; BRAO branch retinal artery occlusion; RT retinal tear; SB scleral buckle; PPV pars plana vitrectomy; VH Vitreous hemorrhage; PRP panretinal laser photocoagulation; IVK intravitreal kenalog; VMT vitreomacular traction; MH Macular hole;  NVD neovascularization of the disc; NVE neovascularization elsewhere; AREDS age related eye disease study; ARMD age related macular degeneration; POAG primary open angle glaucoma; EBMD epithelial/anterior basement membrane dystrophy; ACIOL anterior chamber intraocular lens; IOL intraocular lens; PCIOL posterior chamber intraocular lens; Phaco/IOL phacoemulsification with intraocular  lens placement; [place] photorefractive keratectomy; LASIK laser assisted in situ keratomileusis; HTN hypertension; DM diabetes mellitus; COPD chronic obstructive pulmonary disease

## 2021-04-13 ENCOUNTER — Ambulatory Visit (INDEPENDENT_AMBULATORY_CARE_PROVIDER_SITE_OTHER): Payer: Medicaid Other | Admitting: Ophthalmology

## 2021-04-13 ENCOUNTER — Encounter (INDEPENDENT_AMBULATORY_CARE_PROVIDER_SITE_OTHER): Payer: Self-pay | Admitting: Ophthalmology

## 2021-04-13 ENCOUNTER — Other Ambulatory Visit: Payer: Self-pay

## 2021-04-13 DIAGNOSIS — E113313 Type 2 diabetes mellitus with moderate nonproliferative diabetic retinopathy with macular edema, bilateral: Secondary | ICD-10-CM | POA: Diagnosis not present

## 2021-04-13 DIAGNOSIS — I10 Essential (primary) hypertension: Secondary | ICD-10-CM | POA: Diagnosis not present

## 2021-04-13 DIAGNOSIS — H35033 Hypertensive retinopathy, bilateral: Secondary | ICD-10-CM

## 2021-04-13 DIAGNOSIS — H2513 Age-related nuclear cataract, bilateral: Secondary | ICD-10-CM

## 2021-04-15 ENCOUNTER — Encounter (INDEPENDENT_AMBULATORY_CARE_PROVIDER_SITE_OTHER): Payer: Self-pay | Admitting: Ophthalmology

## 2021-04-15 DIAGNOSIS — E113313 Type 2 diabetes mellitus with moderate nonproliferative diabetic retinopathy with macular edema, bilateral: Secondary | ICD-10-CM

## 2021-04-15 MED ORDER — BEVACIZUMAB CHEMO INJECTION 1.25MG/0.05ML SYRINGE FOR KALEIDOSCOPE
1.2500 mg | INTRAVITREAL | Status: AC | PRN
  Administered 2021-04-15: 1.25 mg via INTRAVITREAL

## 2021-04-15 NOTE — Progress Notes (Signed)
[facility] Note  04/16/2021     CHIEF COMPLAINT Patient presents for Retina Follow Up  HISTORY OF PRESENT ILLNESS: Ana Long is a 46 y.o. female who presents to the clinic today for:   HPI     Retina Follow Up   Patient presents with  Diabetic Retinopathy.  In both eyes.  This started 2 days ago.  I, the attending physician,  performed the HPI with the patient and updated documentation appropriately.        Comments   Patient here for 2 days retina follow up for NPDR OU. Patient  states vision has a floater in OS. No eye pain. Not as sore as was.       Last edited by Bernarda Caffey, MD on 04/16/2021 11:00 AM.     Pt here for IVA OD   Referring physician: [name] MD [place], [place] [id number]  HISTORICAL INFORMATION:   Selected notes from the [facility] Re-referred by Dr. [name] for DM exam   CURRENT MEDICATIONS: Current Outpatient Medications (Ophthalmic Drugs)  Medication Sig   Olopatadine HCl 0.2 % SOLN Place one drop into both eyes daily.   No current facility-administered medications for this visit. (Ophthalmic Drugs)   Current Outpatient Medications (Other)  Medication Sig   acyclovir (ZOVIRAX) 400 MG tablet TAKE 1 TABLET BY MOUTH TWICE A DAY   albuterol (PROAIR HFA) 108 (90 Base) MCG/ACT inhaler Inhale 2 puffs into the lungs every 4 (four) hours as needed for wheezing or shortness of breath (cough). (Patient taking differently: Inhale 2 puffs into the lungs every 4 (four) hours as needed for wheezing or shortness of breath (or coughing).)   atorvastatin (LIPITOR) 10 MG tablet Take 10 mg by mouth daily.   benzonatate (TESSALON) 200 MG capsule Take 1 capsule (200 mg total) by mouth 3 (three) times daily as needed for cough.   Blood Glucose Monitoring Suppl (ACCU-CHEK AVIVA PLUS) w/Device KIT Please check you blood sugars 4 times a day   CIPRODEX OTIC suspension Place into the right ear.    DERMA-SMOOTHE/FS SCALP 0.01 % OIL Apply topically.   fluconazole (DIFLUCAN) 150 MG tablet TAKE 1 TABLET BY MOUTH NOW. THEN REPEAT IN 1 WEEK   furosemide (LASIX) 20 MG tablet Take 20 mg by mouth daily as needed for fluid or edema.   glucose blood (ACCU-CHEK SMARTVIEW) test strip TEST FOUR TIMES DAILY   HUMULIN R U-500 KWIKPEN 500 UNIT/ML kwikpen Inject into the skin.   hydrochlorothiazide (HYDRODIURIL) 12.5 MG tablet Take by mouth.   Insulin Pen Needle (B-D UF III MINI PEN NEEDLES) 31G X 5 MM MISC USE AS DIRECTED TO INJECT INSULIN FIVE A DAY E11.65   lactulose (CEPHULAC) 10 g packet Take 1 packet (10 g total) by mouth daily as needed. Reported on 07/29/2015 (Patient taking differently: Take 10 g by mouth daily as needed (for constipation).)   Lancets (ACCU-CHEK SOFT TOUCH) lancets Use as instructed   loratadine (CLARITIN) 10 MG tablet Take 1 tablet (10 mg total) by mouth 2 (two) times daily.   mometasone-formoterol (DULERA) 200-5 MCG/ACT AERO Inhale 2 puffs into the lungs 2 (two) times daily.   montelukast (SINGULAIR) 10 MG tablet Take 1 tablet (10 mg total) by mouth at bedtime.   nitroGLYCERIN (NITRODUR - DOSED IN MG/24 HR) 0.2 mg/hr patch Apply 1/4th patch to affected achilles, change daily   omeprazole (PRILOSEC) 20 MG capsule Take 1 capsule (20  mg total) by mouth daily before breakfast.   SPIRIVA RESPIMAT 1.25 MCG/ACT AERS SMARTSIG:2 Puff(s) Via Inhaler Daily   topiramate (TOPAMAX) 25 MG tablet Take by mouth.   methocarbamol (ROBAXIN) 500 MG tablet Take 1 tablet (500 mg total) by mouth every 8 (eight) hours as needed. (Patient not taking: Reported on 01/05/2021)   Current Facility-Administered Medications (Other)  Medication Route   diphenhydrAMINE (BENADRYL) injection 12.5 mg Intramuscular   REVIEW OF SYSTEMS: ROS   Positive for: Gastrointestinal, Endocrine, Eyes, Respiratory Negative for: Constitutional, Neurological, Skin, Genitourinary, Musculoskeletal, HENT, Cardiovascular,  Psychiatric, Allergic/Imm, Heme/Lymph Last edited by Theodore Demark, COA on 04/16/2021 10:23 AM.      ALLERGIES Allergies  Allergen Reactions   Bee Venom Anaphylaxis and Swelling   Wasp Venom Anaphylaxis and Swelling   Citrus Hives and Itching   Latex Hives and Swelling   Penicillins Hives and Itching    Has patient had a PCN reaction causing immediate rash, facial/tongue/throat swelling, SOB or lightheadedness with hypotension: Yes Has patient had a PCN reaction causing severe rash involving mucus membranes or skin necrosis: Unk Has patient had a PCN reaction that required hospitalization: Was already in hosp Has patient had a PCN reaction occurring within the last 10 years: No If all of the above answers are "NO", then may proceed with Cephalosporin use.    Shellfish Allergy Other (See Comments) and Swelling    Patient is uncertain; stated that she tolerates this (??)   Soap Hives, Itching and Swelling    PUREX LAUNDRY DETERGENT   Tomato Hives and Itching   Banana Itching and Nausea Only   Chocolate Itching   Fluticasone Other (See Comments)    Per patient, makes sneeze and gag   Hydrocodone Itching and Nausea Only   Other Itching    Acidic foods- Itching [place]- Itching in the roof of the mouth   Adhesive [Tape] Itching and Rash    Allergic to  "leads"   Doxycycline Other (See Comments)    AFFECTS JOINTS   Liraglutide Nausea Only   Metformin And Related Nausea Only, Rash, Other (See Comments) and Diarrhea    GI UPSET, also   Red Dye Itching, Rash and Other (See Comments)    At eye doctor's office, the dilating drops- Skin breaks out, chest turned red, itching, face became swollen   PAST MEDICAL HISTORY Past Medical History:  Diagnosis Date   Acute nonintractable headache 12/26/2017   Arthritis    major joints   Asthma    prn inhaler   Asthma exacerbation 07/25/2013   Asthma in adult, severe persistent, with acute exacerbation 01/11/2018   Carpal tunnel syndrome  of right wrist 09/2012   Cataract    NS OU   Complication of anesthesia    states is hard to wake up post-op   Constipation 12/30/2014   Diabetes mellitus    intolerant to Metformin   Diabetic retinopathy ([place])    NPDR OU   Dry skin    hands    Family history of anesthesia complication    mother went into coma during c-section   Genital herpes 01/01/2006   on acyclovir BID    GERD (gastroesophageal reflux disease)    Herpes genital    Hyperglycemia 12/26/2017   Hypertensive retinopathy    OU   Low back pain 11/22/2012   Lower extremity edema 10/18/2012   Menorrhagia 11/06/2015   Morbid obesity with BMI of 50 to 60 07/23/2009   Obesity hypoventilation syndrome ([place]) 10/30/2012  Obesity, morbid ([place]) 01/10/2014   OSA on CPAP     AHI was on  11-17-12 to 120/hr, titrated to 15 cm with 3 cm EPR/    PCOS (polycystic ovarian syndrome)    Rheumatoid arthritis ([place])    Right ankle pain 06/19/2015   Right hip pain 09/04/2017   Right shoulder pain 06/01/2017   Seasonal allergies    Sleep apnea, obstructive    uses CPAP nightly - had sleep study 08/22/2007   Past Surgical History:  Procedure Laterality Date   CARPAL TUNNEL RELEASE Right 10/02/2012   Procedure: RIGHT CARPAL TUNNEL RELEASE ENDOSCOPIC;  Surgeon: Jolyn Nap, MD;  Location: [facility];  Service: Orthopedics;  Laterality: Right;   CHOLECYSTECTOMY N/A 12/02/2014   Procedure: LAPAROSCOPIC CHOLECYSTECTOMY;  Surgeon: Armandina Gemma, MD;  Location: [facility];  Service: General;  Laterality: N/A;   HYSTEROSCOPY WITH D & C  01-08-2002   FAMILY HISTORY Family History  Problem Relation Age of Onset   Other Mother    Diabetes Maternal Aunt    Heart attack Maternal Aunt    Alzheimer's disease Maternal Uncle    Cancer Maternal Uncle        prostate   Diabetes Paternal Aunt    Cancer Paternal Aunt        brain,mouth   Alzheimer's disease Paternal Aunt    Colon polyps Paternal Uncle    Diabetes Paternal Uncle    Diabetes  Maternal Grandmother    Diabetes Maternal Grandfather    Leukemia Cousin    Other Other    Obesity Other    Other Other    Colitis Neg Hx    SOCIAL HISTORY Social History   Tobacco Use   Smoking status: Former    Years: 10.00    Types: Cigarettes    Quit date: 05/24/2010    Years since quitting: 10.9   Smokeless tobacco: Never  Vaping Use   Vaping Use: Never used  Substance Use Topics   Alcohol use: Yes    Alcohol/week: 0.0 standard drinks    Comment: socially   Drug use: No       OPHTHALMIC EXAM: Base Eye Exam     Visual Acuity (Snellen - Linear)       Right Left   Dist cc 20/30 -1 20/30   Dist ph cc 20/20 -1 20/25 +2    Correction: Glasses         Tonometry (Tonopen, 10:21 AM)       Right Left   Pressure 14 def         Pupils       Dark Light Shape React APD   Right 3 2 Round Brisk None   Left 3 2 Round Brisk None         Visual Fields (Counting fingers)       Left Right    Full Full         Extraocular Movement       Right Left    Full, Ortho Full, Ortho         Neuro/Psych     Oriented x3: Yes   Mood/Affect: Normal         Dilation     Right eye: 1.0% Mydriacyl, 2.5% Phenylephrine @ 10:21 AM  Patient wanted only the OD dilated.          Slit Lamp and Fundus Exam     Slit Lamp Exam       Right  Left   Lids/Lashes Dermatochalasis - upper lid, mild Meibomian gland dysfunction Dermatochalasis - upper lid, mild Meibomian gland dysfunction   Conjunctiva/Sclera White and quiet White and quiet   Cornea 1-2+Punctate epithelial erosions 2-3+fine Punctate epithelial erosions   Anterior Chamber deep and quiet deep and quiet   Iris round and dilated, No NVI round and dilated   Lens 1+ NS, 1+CC 1+NS, 1+CC   Anterior Vitreous mild vitreous syneresis mild vitreous syneresis, scattered vitreous condensations         Fundus Exam       Right Left   Disc Pink and sharp, +cupping Pink and sharp, +cupping   C/D Ratio 0.7 0.6    Macula good foveal reflex, focal exudates inferior to fovea and scattered superior macula, scattered MA, focal cystic changes IN macula - persistent, focal laser changes inferior macula good foveal reflex, scatttered MA's and exudate, central cystic changes -- slightly improved, focal cluster of exudates temporal macula -  persistent, small cluster of MA central macula   Vessels attenuated, Tortuous Vascular attenuation, Tortuous, Copper wiring   Periphery Attached, scattered MA/exudates greatest nasally Attached, scattered MA and exudate greatest superior to disc, white without pressure temporally           Refraction     Wearing Rx       Sphere Cylinder Axis   Right -2.50 +1.50 013   Left -2.25 Sphere     Type: SVL           IMAGING AND PROCEDURES  Imaging and Procedures for @TODAY @  OCT, Retina - OU - Both Eyes       Right Eye Quality was good. Central Foveal Thickness: 229. Progression has been stable. Findings include no SRF, intraretinal fluid, intraretinal hyper-reflective material, vitreomacular adhesion , normal foveal contour (Persistent IRF/IRHM temporal macula and fovea).   Left Eye Quality was good. Central Foveal Thickness: 260. Progression has improved. Findings include intraretinal fluid, no SRF, abnormal foveal contour, intraretinal hyper-reflective material (Mild improvement in IRF/IRHM).   Notes *Images captured and stored on drive  Diagnosis / Impression:  +DME OU OD: Persistent IRF/IRHM temporal macula and fovea OS: Mild improvement in IRF/IRHM  Clinical management:  See below  Abbreviations: NFP - Normal foveal profile. CME - cystoid macular edema. PED - pigment epithelial detachment. IRF - intraretinal fluid. SRF - subretinal fluid. EZ - ellipsoid zone. ERM - epiretinal membrane. ORA - outer retinal atrophy. ORT - outer retinal tubulation. SRHM - subretinal hyper-reflective material      Intravitreal Injection, Pharmacologic Agent - OD -  Right Eye       Time Out 04/16/2021. 10:41 AM. Confirmed correct patient, procedure, site, and patient consented.   Anesthesia Topical anesthesia was used. Anesthetic medications included Lidocaine 2%, Proparacaine 0.5%.   Procedure Preparation included 5% betadine to ocular surface, eyelid speculum. A supplied (32g) needle was used.   Injection: 1.25 mg Bevacizumab 1.82m/0.05ml   Route: Intravitreal, Site: Right Eye   NDC:: 74259-563-87 Lot: [id number] , Expiration date: 05/17/2021   Post-op Post injection exam found visual acuity of at least counting fingers. The patient tolerated the procedure well. There were no complications. The patient received written and verbal post procedure care education. Post injection medications were not given.             ASSESSMENT/PLAN:    ICD-10-CM   1. Moderate nonproliferative diabetic retinopathy of both eyes with macular edema associated with type 2 diabetes mellitus ([place]  [address] Retina - OU -  Both Eyes    Intravitreal Injection, Pharmacologic Agent - OD - Right Eye    Bevacizumab (AVASTIN) SOLN 1.25 mg    2. Essential hypertension  I10     3. Hypertensive retinopathy of both eyes  H35.033     4. Nuclear sclerosis of both eyes  H25.13      1.  Moderate non-proliferative diabetic retinopathy w/ DME, OU  - pt lost to f/u from 10.23.2019 to 11.18.20  - FA in 2019 showed leaking MA OU; no NV OU  **of note, pt had pruritic rxn to fluorescein dye -- resolved with 12.5 mg IV benadryl**  - FA 12.16.2020 shows late leaking MA's, non-perfusion defects; no NV OU  - s/p IVA OD #1 (12.16.20), #2 (01.25.21), #3 (05.26.21), #4 (07.02.21), #5 (10.15.21), #6 (11.23.21), #7 (07.27.22), #8 (09.09.22), #9 (11.11.22), #10 (01.20.23)  - s/p focal laser OD (03.19.21), (04.01.22)  - s/p IVA OS #1 OS (12.23.20), #2 (01.27.21), #3 (03.02.21), #4 (05.26.21), #5 (07.02.21), #6 (10.15.21), #7 (11.23.21), #8 (01.11.22), #9 (03.04.22), #10 (04.27.22), #11  (11.14.22), #12 (01.10.23), #13 (02.20.23)  - exam shows scattered MA, IRH, exudates and mild macular edema OU  - OCT stable OD, mild interval improvement in IRF OS  - BCVA OD: 20/20, OS: 20/20+2             - Recommend IVA OD #11 today, 02.23.23  - pt wishes to proceed with IVA OD - RBA of procedure discussed, questions answered - see procedure note   - Avastin informed consent form signed and scanned on 01.27.2021  - f/u 6 wks -- DFE/OCT, possible injection(s)  2,3. Hypertensive retinopathy OU  - discussed importance of tight BP control  - monitor  4. Nuclear Sclerosis   - The symptoms of cataract, surgical options, and treatments and risks were discussed with patient.   - discussed diagnosis and progression  - under the expert management of [place] Ordered this visit:  Meds ordered this encounter  Medications   Bevacizumab (AVASTIN) SOLN 1.25 mg      Return in about 6 weeks (around 05/28/2021) for DME OU, Dilated Exam, OCT, Possible Injxn.  There are no Patient Instructions on file for this visit.  Explained the diagnoses, plan, and follow up with the patient and they expressed understanding.  Patient expressed understanding of the importance of proper follow up care.  This document serves as a record of services personally performed by Gardiner Sleeper, MD, PhD. It was created on their behalf by San Jetty. Owens Shark, OA an ophthalmic technician. The creation of this record is the provider's dictation and/or activities during the visit.    Electronically signed by: San Jetty. Owens Shark, [place] 02.22.2023 1:56 PM  Gardiner Sleeper, M.D., Ph.D. Diseases & Surgery of the Retina and Vitreous [facility]  I have reviewed the above documentation for accuracy and completeness, and I agree with the above. Gardiner Sleeper, M.D., Ph.D. 04/16/21 1:56 PM  Abbreviations: M myopia (nearsighted); A astigmatism; H hyperopia (farsighted); P presbyopia; Mrx  spectacle prescription;  CTL contact lenses; OD right eye; OS left eye; OU both eyes  XT exotropia; ET esotropia; PEK punctate epithelial keratitis; PEE punctate epithelial erosions; DES dry eye syndrome; MGD meibomian gland dysfunction; ATs artificial tears; PFAT's preservative free artificial tears; [place] nuclear sclerotic cataract; PSC posterior subcapsular cataract; ERM epi-retinal membrane; PVD posterior vitreous detachment; RD retinal detachment; DM diabetes mellitus; DR diabetic retinopathy; NPDR non-proliferative diabetic retinopathy; PDR proliferative  diabetic retinopathy; CSME clinically significant macular edema; DME diabetic macular edema; dbh dot blot hemorrhages; CWS cotton wool spot; POAG primary open angle glaucoma; C/D cup-to-disc ratio; HVF humphrey visual field; GVF goldmann visual field; OCT optical coherence tomography; IOP intraocular pressure; BRVO Branch retinal vein occlusion; CRVO central retinal vein occlusion; CRAO central retinal artery occlusion; BRAO branch retinal artery occlusion; RT retinal tear; SB scleral buckle; PPV pars plana vitrectomy; VH Vitreous hemorrhage; PRP panretinal laser photocoagulation; IVK intravitreal kenalog; VMT vitreomacular traction; MH Macular hole;  NVD neovascularization of the disc; NVE neovascularization elsewhere; AREDS age related eye disease study; ARMD age related macular degeneration; POAG primary open angle glaucoma; EBMD epithelial/anterior basement membrane dystrophy; ACIOL anterior chamber intraocular lens; IOL intraocular lens; PCIOL posterior chamber intraocular lens; Phaco/IOL phacoemulsification with intraocular lens placement; [place] photorefractive keratectomy; LASIK laser assisted in situ keratomileusis; HTN hypertension; DM diabetes mellitus; COPD chronic obstructive pulmonary disease

## 2021-04-16 ENCOUNTER — Ambulatory Visit (INDEPENDENT_AMBULATORY_CARE_PROVIDER_SITE_OTHER): Payer: Medicaid Other | Admitting: Ophthalmology

## 2021-04-16 ENCOUNTER — Other Ambulatory Visit: Payer: Self-pay

## 2021-04-16 ENCOUNTER — Encounter (INDEPENDENT_AMBULATORY_CARE_PROVIDER_SITE_OTHER): Payer: Self-pay | Admitting: Ophthalmology

## 2021-04-16 DIAGNOSIS — I10 Essential (primary) hypertension: Secondary | ICD-10-CM

## 2021-04-16 DIAGNOSIS — H2513 Age-related nuclear cataract, bilateral: Secondary | ICD-10-CM

## 2021-04-16 DIAGNOSIS — E113313 Type 2 diabetes mellitus with moderate nonproliferative diabetic retinopathy with macular edema, bilateral: Secondary | ICD-10-CM | POA: Diagnosis not present

## 2021-04-16 DIAGNOSIS — H35033 Hypertensive retinopathy, bilateral: Secondary | ICD-10-CM

## 2021-04-16 MED ORDER — BEVACIZUMAB CHEMO INJECTION 1.25MG/0.05ML SYRINGE FOR KALEIDOSCOPE
1.2500 mg | INTRAVITREAL | Status: AC | PRN
  Administered 2021-04-16: 1.25 mg via INTRAVITREAL

## 2021-05-25 NOTE — Progress Notes (Signed)
?[facility] Note ? ?05/27/2021 ? ?  ? ?CHIEF COMPLAINT ?Patient presents for Retina Follow Up ? ?HISTORY OF PRESENT ILLNESS: ?Ana Long is a 46 y.o. female who presents to the clinic today for:  ? ?HPI   ? ? Retina Follow Up   ?Patient presents with  Diabetic Retinopathy.  In both eyes.  This started 6 weeks ago.  I, the attending physician,  performed the HPI with the patient and updated documentation appropriately. ? ?  ?  ? ? Comments   ?Patient here for 6 weeks retina follow up for NPDR OU. Patient states vision not the best. Seeing spot OS more than usually do. Saw something flash of black streak by. Can't tell which eye. Sees a dot-light. Don't see detail like the the rail that the racks in a toaster oven go in. No eye pain. Objects looking different than what they are.  ? ?  ?  ?Last edited by Bernarda Caffey, MD on 05/27/2021  5:07 PM.  ?  ?Pt states her fine vision is decreased.  ? ?Referring physician: ?[name] MD ?[address] ?[place], [place] [address] ? ?HISTORICAL INFORMATION:  ? ?Selected notes from the [facility] ?Re-referred by Dr. [name] for DM exam  ? ?CURRENT MEDICATIONS: ?Current Outpatient Medications (Ophthalmic Drugs)  ?Medication Sig  ? Olopatadine HCl 0.2 % SOLN Place one drop into both eyes daily.  ? ?No current facility-administered medications for this visit. (Ophthalmic Drugs)  ? ?Current Outpatient Medications (Other)  ?Medication Sig  ? acyclovir (ZOVIRAX) 400 MG tablet TAKE 1 TABLET BY MOUTH TWICE A DAY  ? albuterol (PROAIR HFA) 108 (90 Base) MCG/ACT inhaler Inhale 2 puffs into the lungs every 4 (four) hours as needed for wheezing or shortness of breath (cough). (Patient taking differently: Inhale 2 puffs into the lungs every 4 (four) hours as needed for wheezing or shortness of breath (or coughing).)  ? atorvastatin (LIPITOR) 10 MG tablet Take 10 mg by mouth daily.  ? benzonatate (TESSALON) 200 MG capsule Take 1 capsule (200  mg total) by mouth 3 (three) times daily as needed for cough.  ? Blood Glucose Monitoring Suppl (ACCU-CHEK AVIVA PLUS) w/Device KIT Please check you blood sugars 4 times a day  ? CIPRODEX OTIC suspension Place into the right ear.  ? DERMA-SMOOTHE/FS SCALP 0.01 % OIL Apply topically.  ? fluconazole (DIFLUCAN) 150 MG tablet TAKE 1 TABLET BY MOUTH NOW. THEN REPEAT IN 1 WEEK  ? furosemide (LASIX) 20 MG tablet Take 20 mg by mouth daily as needed for fluid or edema.  ? glucose blood (ACCU-CHEK SMARTVIEW) test strip TEST FOUR TIMES DAILY  ? HUMULIN R U-500 KWIKPEN 500 UNIT/ML kwikpen Inject into the skin.  ? hydrochlorothiazide (HYDRODIURIL) 12.5 MG tablet Take by mouth.  ? Insulin Pen Needle (B-D UF III MINI PEN NEEDLES) 31G X 5 MM MISC USE AS DIRECTED TO INJECT INSULIN FIVE A DAY E11.65  ? lactulose (CEPHULAC) 10 g packet Take 1 packet (10 g total) by mouth daily as needed. Reported on 07/29/2015 (Patient taking differently: Take 10 g by mouth daily as needed (for constipation).)  ? Lancets (ACCU-CHEK SOFT TOUCH) lancets Use as instructed  ? loratadine (CLARITIN) 10 MG tablet Take 1 tablet (10 mg total) by mouth 2 (two) times daily.  ? mometasone-formoterol (DULERA) 200-5 MCG/ACT AERO Inhale 2 puffs into the lungs 2 (two) times daily.  ? montelukast (SINGULAIR) 10 MG tablet Take 1 tablet (  10 mg total) by mouth at bedtime.  ? nitroGLYCERIN (NITRODUR - DOSED IN MG/24 HR) 0.2 mg/hr patch Apply 1/4th patch to affected achilles, change daily  ? omeprazole (PRILOSEC) 20 MG capsule Take 1 capsule (20 mg total) by mouth daily before breakfast.  ? SPIRIVA RESPIMAT 1.25 MCG/ACT AERS SMARTSIG:2 Puff(s) Via Inhaler Daily  ? topiramate (TOPAMAX) 25 MG tablet Take by mouth.  ? methocarbamol (ROBAXIN) 500 MG tablet Take 1 tablet (500 mg total) by mouth every 8 (eight) hours as needed. (Patient not taking: Reported on 01/05/2021)  ? ?Current Facility-Administered Medications (Other)  ?Medication Route  ? diphenhydrAMINE (BENADRYL)  injection 12.5 mg Intramuscular  ? ?REVIEW OF SYSTEMS: ?ROS   ?Positive for: Gastrointestinal, Endocrine, Eyes, Respiratory ?Negative for: Constitutional, Neurological, Skin, Genitourinary, Musculoskeletal, HENT, Cardiovascular, Psychiatric, Allergic/Imm, Heme/Lymph ?Last edited by Theodore Demark, COA on 05/27/2021  1:58 PM.  ?  ? ?ALLERGIES ?Allergies  ?Allergen Reactions  ? Bee Venom Anaphylaxis and Swelling  ? Wasp Venom Anaphylaxis and Swelling  ? Citrus Hives and Itching  ? Latex Hives and Swelling  ? Penicillins Hives and Itching  ?  Has patient had a PCN reaction causing immediate rash, facial/tongue/throat swelling, SOB or lightheadedness with hypotension: Yes ?Has patient had a PCN reaction causing severe rash involving mucus membranes or skin necrosis: Unk ?Has patient had a PCN reaction that required hospitalization: Was already in hosp ?Has patient had a PCN reaction occurring within the last 10 years: No ?If all of the above answers are "NO", then may proceed with Cephalosporin use. ?  ? Shellfish Allergy Other (See Comments) and Swelling  ?  Patient is uncertain; stated that she tolerates this (??)  ? Soap Hives, Itching and Swelling  ?  PUREX LAUNDRY DETERGENT  ? Tomato Hives and Itching  ? Banana Itching and Nausea Only  ? Chocolate Itching  ? Fluticasone Other (See Comments)  ?  Per patient, makes sneeze and gag  ? Hydrocodone Itching and Nausea Only  ? Other Itching  ?  Acidic foods- Itching ?[place]- Itching in the roof of the mouth  ? Adhesive [Tape] Itching and Rash  ?  Allergic to  "leads"  ? Doxycycline Other (See Comments)  ?  AFFECTS JOINTS  ? Liraglutide Nausea Only  ? Metformin And Related Nausea Only, Rash, Other (See Comments) and Diarrhea  ?  GI UPSET, also  ? Red Dye Itching, Rash and Other (See Comments)  ?  At eye doctor's office, the dilating drops- Skin breaks out, chest turned red, itching, face became swollen  ? ?PAST MEDICAL HISTORY ?Past Medical History:  ?Diagnosis Date  ?  Acute nonintractable headache 12/26/2017  ? Arthritis   ? major joints  ? Asthma   ? prn inhaler  ? Asthma exacerbation 07/25/2013  ? Asthma in adult, severe persistent, with acute exacerbation 01/11/2018  ? Carpal tunnel syndrome of right wrist 09/2012  ? Cataract   ? NS OU  ? Complication of anesthesia   ? states is hard to wake up post-op  ? Constipation 12/30/2014  ? Diabetes mellitus   ? intolerant to Metformin  ? Diabetic retinopathy ([place])   ? NPDR OU  ? Dry skin   ? hands   ? Family history of anesthesia complication   ? mother went into coma during c-section  ? Genital herpes 01/01/2006  ? on acyclovir BID   ? GERD (gastroesophageal reflux disease)   ? Herpes genital   ? Hyperglycemia 12/26/2017  ? Hypertensive retinopathy   ?  OU  ? Low back pain 11/22/2012  ? Lower extremity edema 10/18/2012  ? Menorrhagia 11/06/2015  ? Morbid obesity with BMI of 50 to 60 07/23/2009  ? Obesity hypoventilation syndrome ([place]) 10/30/2012  ? Obesity, morbid ([place]) 01/10/2014  ? OSA on CPAP   ?  AHI was on  11-17-12 to 120/hr, titrated to 15 cm with 3 cm EPR/   ? PCOS (polycystic ovarian syndrome)   ? Rheumatoid arthritis ([place])   ? Right ankle pain 06/19/2015  ? Right hip pain 09/04/2017  ? Right shoulder pain 06/01/2017  ? Seasonal allergies   ? Sleep apnea, obstructive   ? uses CPAP nightly - had sleep study 08/22/2007  ? ?Past Surgical History:  ?Procedure Laterality Date  ? CARPAL TUNNEL RELEASE Right 10/02/2012  ? Procedure: RIGHT CARPAL TUNNEL RELEASE ENDOSCOPIC;  Surgeon: Jolyn Nap, MD;  Location: [facility];  Service: Orthopedics;  Laterality: Right;  ? CHOLECYSTECTOMY N/A 12/02/2014  ? Procedure: LAPAROSCOPIC CHOLECYSTECTOMY;  Surgeon: Armandina Gemma, MD;  Location: [facility];  Service: General;  Laterality: N/A;  ? HYSTEROSCOPY WITH D & C  01-08-2002  ? ?FAMILY HISTORY ?Family History  ?Problem Relation Age of Onset  ? Other Mother   ? Diabetes Maternal Aunt   ? Heart attack Maternal Aunt   ? Alzheimer's disease Maternal  Uncle   ? Cancer Maternal Uncle   ?     prostate  ? Diabetes Paternal Aunt   ? Cancer Paternal Aunt   ?     brain,mouth  ? Alzheimer's disease Paternal Aunt   ? Colon polyps Paternal Uncle   ? Diabetes Paternal

## 2021-05-27 ENCOUNTER — Ambulatory Visit (INDEPENDENT_AMBULATORY_CARE_PROVIDER_SITE_OTHER): Payer: Medicaid Other | Admitting: Ophthalmology

## 2021-05-27 ENCOUNTER — Encounter (INDEPENDENT_AMBULATORY_CARE_PROVIDER_SITE_OTHER): Payer: Self-pay | Admitting: Ophthalmology

## 2021-05-27 DIAGNOSIS — E113313 Type 2 diabetes mellitus with moderate nonproliferative diabetic retinopathy with macular edema, bilateral: Secondary | ICD-10-CM | POA: Diagnosis not present

## 2021-05-27 DIAGNOSIS — H35033 Hypertensive retinopathy, bilateral: Secondary | ICD-10-CM

## 2021-05-27 DIAGNOSIS — I10 Essential (primary) hypertension: Secondary | ICD-10-CM

## 2021-05-27 DIAGNOSIS — H2513 Age-related nuclear cataract, bilateral: Secondary | ICD-10-CM

## 2021-05-27 MED ORDER — BEVACIZUMAB CHEMO INJECTION 1.25MG/0.05ML SYRINGE FOR KALEIDOSCOPE
1.2500 mg | INTRAVITREAL | Status: AC | PRN
  Administered 2021-05-27: 1.25 mg via INTRAVITREAL

## 2021-05-28 NOTE — Progress Notes (Signed)
?[facility] Note ? ?05/29/2021 ? ?  ? ?CHIEF COMPLAINT ?Patient presents for Retina Follow Up ? ?HISTORY OF PRESENT ILLNESS: ?Laaibah A Kirkwood is a 46 y.o. female who presents to the clinic today for:  ? ?HPI   ? ? Retina Follow Up   ?Patient presents with  Diabetic Retinopathy.  In both eyes.  This started 2 days ago.  I, the attending physician,  performed the HPI with the patient and updated documentation appropriately. ? ?  ?  ? ? Comments   ?Patient here for 2 days retina follow up for NPDR OU/injection OD. Patient states vision is ok. No eye pain.  ? ?  ?  ?Last edited by Bernarda Caffey, MD on 05/31/2021  1:11 AM.  ?  ? ?Pt here for IVA OD today ? ?Referring physician: ?[name] MD ?[address] ?[place], [place] [address] ? ?HISTORICAL INFORMATION:  ? ?Selected notes from the [facility] ?Re-referred by Dr. [name] for DM exam  ? ?CURRENT MEDICATIONS: ?Current Outpatient Medications (Ophthalmic Drugs)  ?Medication Sig  ? Olopatadine HCl 0.2 % SOLN Place one drop into both eyes daily.  ? ?No current facility-administered medications for this visit. (Ophthalmic Drugs)  ? ?Current Outpatient Medications (Other)  ?Medication Sig  ? acyclovir (ZOVIRAX) 400 MG tablet TAKE 1 TABLET BY MOUTH TWICE A DAY  ? albuterol (PROAIR HFA) 108 (90 Base) MCG/ACT inhaler Inhale 2 puffs into the lungs every 4 (four) hours as needed for wheezing or shortness of breath (cough). (Patient taking differently: Inhale 2 puffs into the lungs every 4 (four) hours as needed for wheezing or shortness of breath (or coughing).)  ? atorvastatin (LIPITOR) 10 MG tablet Take 10 mg by mouth daily.  ? benzonatate (TESSALON) 200 MG capsule Take 1 capsule (200 mg total) by mouth 3 (three) times daily as needed for cough.  ? Blood Glucose Monitoring Suppl (ACCU-CHEK AVIVA PLUS) w/Device KIT Please check you blood sugars 4 times a day  ? CIPRODEX OTIC suspension Place into the right ear.  ? DERMA-SMOOTHE/FS  SCALP 0.01 % OIL Apply topically.  ? fluconazole (DIFLUCAN) 150 MG tablet TAKE 1 TABLET BY MOUTH NOW. THEN REPEAT IN 1 WEEK  ? furosemide (LASIX) 20 MG tablet Take 20 mg by mouth daily as needed for fluid or edema.  ? glucose blood (ACCU-CHEK SMARTVIEW) test strip TEST FOUR TIMES DAILY  ? HUMULIN R U-500 KWIKPEN 500 UNIT/ML kwikpen Inject into the skin.  ? hydrochlorothiazide (HYDRODIURIL) 12.5 MG tablet Take by mouth.  ? Insulin Pen Needle (B-D UF III MINI PEN NEEDLES) 31G X 5 MM MISC USE AS DIRECTED TO INJECT INSULIN FIVE A DAY E11.65  ? lactulose (CEPHULAC) 10 g packet Take 1 packet (10 g total) by mouth daily as needed. Reported on 07/29/2015 (Patient taking differently: Take 10 g by mouth daily as needed (for constipation).)  ? Lancets (ACCU-CHEK SOFT TOUCH) lancets Use as instructed  ? loratadine (CLARITIN) 10 MG tablet Take 1 tablet (10 mg total) by mouth 2 (two) times daily.  ? mometasone-formoterol (DULERA) 200-5 MCG/ACT AERO Inhale 2 puffs into the lungs 2 (two) times daily.  ? montelukast (SINGULAIR) 10 MG tablet Take 1 tablet (10 mg total) by mouth at bedtime.  ? nitroGLYCERIN (NITRODUR - DOSED IN MG/24 HR) 0.2 mg/hr patch Apply 1/4th patch to affected achilles, change daily  ? omeprazole (PRILOSEC) 20 MG capsule Take 1 capsule (20 mg total) by mouth daily before breakfast.  ?  SPIRIVA RESPIMAT 1.25 MCG/ACT AERS SMARTSIG:2 Puff(s) Via Inhaler Daily  ? topiramate (TOPAMAX) 25 MG tablet Take by mouth.  ? methocarbamol (ROBAXIN) 500 MG tablet Take 1 tablet (500 mg total) by mouth every 8 (eight) hours as needed. (Patient not taking: Reported on 01/05/2021)  ? ?Current Facility-Administered Medications (Other)  ?Medication Route  ? diphenhydrAMINE (BENADRYL) injection 12.5 mg Intramuscular  ? ?REVIEW OF SYSTEMS: ?ROS   ?Positive for: Gastrointestinal, Endocrine, Eyes, Respiratory ?Negative for: Constitutional, Neurological, Skin, Genitourinary, Musculoskeletal, HENT, Cardiovascular, Psychiatric, Allergic/Imm,  Heme/Lymph ?Last edited by Theodore Demark, COA on 05/29/2021  1:36 PM.  ?  ? ? ?ALLERGIES ?Allergies  ?Allergen Reactions  ? Bee Venom Anaphylaxis and Swelling  ? Wasp Venom Anaphylaxis and Swelling  ? Citrus Hives and Itching  ? Latex Hives and Swelling  ? Penicillins Hives and Itching  ?  Has patient had a PCN reaction causing immediate rash, facial/tongue/throat swelling, SOB or lightheadedness with hypotension: Yes ?Has patient had a PCN reaction causing severe rash involving mucus membranes or skin necrosis: Unk ?Has patient had a PCN reaction that required hospitalization: Was already in hosp ?Has patient had a PCN reaction occurring within the last 10 years: No ?If all of the above answers are "NO", then may proceed with Cephalosporin use. ?  ? Shellfish Allergy Other (See Comments) and Swelling  ?  Patient is uncertain; stated that she tolerates this (??)  ? Soap Hives, Itching and Swelling  ?  PUREX LAUNDRY DETERGENT  ? Tomato Hives and Itching  ? Banana Itching and Nausea Only  ? Chocolate Itching  ? Fluticasone Other (See Comments)  ?  Per patient, makes sneeze and gag  ? Hydrocodone Itching and Nausea Only  ? Other Itching  ?  Acidic foods- Itching ?[place]- Itching in the roof of the mouth  ? Adhesive [Tape] Itching and Rash  ?  Allergic to  "leads"  ? Doxycycline Other (See Comments)  ?  AFFECTS JOINTS  ? Liraglutide Nausea Only  ? Metformin And Related Nausea Only, Rash, Other (See Comments) and Diarrhea  ?  GI UPSET, also  ? Red Dye Itching, Rash and Other (See Comments)  ?  At eye doctor's office, the dilating drops- Skin breaks out, chest turned red, itching, face became swollen  ? ?PAST MEDICAL HISTORY ?Past Medical History:  ?Diagnosis Date  ? Acute nonintractable headache 12/26/2017  ? Arthritis   ? major joints  ? Asthma   ? prn inhaler  ? Asthma exacerbation 07/25/2013  ? Asthma in adult, severe persistent, with acute exacerbation 01/11/2018  ? Carpal tunnel syndrome of right wrist 09/2012  ?  Cataract   ? NS OU  ? Complication of anesthesia   ? states is hard to wake up post-op  ? Constipation 12/30/2014  ? Diabetes mellitus   ? intolerant to Metformin  ? Diabetic retinopathy ([place])   ? NPDR OU  ? Dry skin   ? hands   ? Family history of anesthesia complication   ? mother went into coma during c-section  ? Genital herpes 01/01/2006  ? on acyclovir BID   ? GERD (gastroesophageal reflux disease)   ? Herpes genital   ? Hyperglycemia 12/26/2017  ? Hypertensive retinopathy   ? OU  ? Low back pain 11/22/2012  ? Lower extremity edema 10/18/2012  ? Menorrhagia 11/06/2015  ? Morbid obesity with BMI of 50 to 60 07/23/2009  ? Obesity hypoventilation syndrome ([place]) 10/30/2012  ? Obesity, morbid ([place]) 01/10/2014  ?  OSA on CPAP   ?  AHI was on  11-17-12 to 120/hr, titrated to 15 cm with 3 cm EPR/   ? PCOS (polycystic ovarian syndrome)   ? Rheumatoid arthritis ([place])   ? Right ankle pain 06/19/2015  ? Right hip pain 09/04/2017  ? Right shoulder pain 06/01/2017  ? Seasonal allergies   ? Sleep apnea, obstructive   ? uses CPAP nightly - had sleep study 08/22/2007  ? ?Past Surgical History:  ?Procedure Laterality Date  ? CARPAL TUNNEL RELEASE Right 10/02/2012  ? Procedure: RIGHT CARPAL TUNNEL RELEASE ENDOSCOPIC;  Surgeon: Jolyn Nap, MD;  Location: [facility];  Service: Orthopedics;  Laterality: Right;  ? CHOLECYSTECTOMY N/A 12/02/2014  ? Procedure: LAPAROSCOPIC CHOLECYSTECTOMY;  Surgeon: Armandina Gemma, MD;  [facility];  Service: General;  Laterality: N/A;  ? HYSTEROSCOPY WITH D & C  01-08-2002  ? ?FAMILY HISTORY ?Family History  ?Problem Relation Age of Onset  ? Other Mother   ? Diabetes Maternal Aunt   ? Heart attack Maternal Aunt   ? Alzheimer's disease Maternal Uncle   ? Cancer Maternal Uncle   ?     prostate  ? Diabetes Paternal Aunt   ? Cancer Paternal Aunt   ?     brain,mouth  ? Alzheimer's disease Paternal Aunt   ? Colon polyps Paternal Uncle   ? Diabetes Paternal Uncle   ? Diabetes Maternal Grandmother   ?  Diabetes Maternal Grandfather   ? Leukemia Cousin   ? Other Other   ? Obesity Other   ? Other Other   ? Colitis Neg Hx   ? ?SOCIAL HISTORY ?Social History  ? ?Tobacco Use  ? Smoking status: Former  ?  Darreld Mclean

## 2021-05-29 ENCOUNTER — Encounter (INDEPENDENT_AMBULATORY_CARE_PROVIDER_SITE_OTHER): Payer: Self-pay | Admitting: Ophthalmology

## 2021-05-29 ENCOUNTER — Ambulatory Visit (INDEPENDENT_AMBULATORY_CARE_PROVIDER_SITE_OTHER): Payer: Medicaid Other | Admitting: Ophthalmology

## 2021-05-29 DIAGNOSIS — H35033 Hypertensive retinopathy, bilateral: Secondary | ICD-10-CM

## 2021-05-29 DIAGNOSIS — E113313 Type 2 diabetes mellitus with moderate nonproliferative diabetic retinopathy with macular edema, bilateral: Secondary | ICD-10-CM | POA: Diagnosis not present

## 2021-05-29 DIAGNOSIS — I10 Essential (primary) hypertension: Secondary | ICD-10-CM

## 2021-05-29 DIAGNOSIS — H2513 Age-related nuclear cataract, bilateral: Secondary | ICD-10-CM

## 2021-05-31 ENCOUNTER — Encounter (INDEPENDENT_AMBULATORY_CARE_PROVIDER_SITE_OTHER): Payer: Self-pay | Admitting: Ophthalmology

## 2021-05-31 MED ORDER — BEVACIZUMAB CHEMO INJECTION 1.25MG/0.05ML SYRINGE FOR KALEIDOSCOPE
1.2500 mg | INTRAVITREAL | Status: AC | PRN
  Administered 2021-05-31: 1.25 mg via INTRAVITREAL

## 2021-06-23 NOTE — Progress Notes (Shared)
?[facility] Note ? ?07/03/2021 ? ?  ? ?CHIEF COMPLAINT ?Patient presents for No chief complaint on file. ? ?HISTORY OF PRESENT ILLNESS: ?Ana Long is a 46 y.o. female who presents to the clinic today for:  ? ? ? ?Pt here for IVA OD today ? ?Referring physician: ?[name] MD ?[address] ?[place], [place] [address] ? ?HISTORICAL INFORMATION:  ? ?Selected notes from the [facility] ?Re-referred by Dr. [name] for DM exam  ? ?CURRENT MEDICATIONS: ?Current Outpatient Medications (Ophthalmic Drugs)  ?Medication Sig  ? Olopatadine HCl 0.2 % SOLN Place one drop into both eyes daily.  ? ?No current facility-administered medications for this visit. (Ophthalmic Drugs)  ? ?Current Outpatient Medications (Other)  ?Medication Sig  ? acyclovir (ZOVIRAX) 400 MG tablet TAKE 1 TABLET BY MOUTH TWICE A DAY  ? albuterol (PROAIR HFA) 108 (90 Base) MCG/ACT inhaler Inhale 2 puffs into the lungs every 4 (four) hours as needed for wheezing or shortness of breath (cough). (Patient taking differently: Inhale 2 puffs into the lungs every 4 (four) hours as needed for wheezing or shortness of breath (or coughing).)  ? atorvastatin (LIPITOR) 10 MG tablet Take 10 mg by mouth daily.  ? benzonatate (TESSALON) 200 MG capsule Take 1 capsule (200 mg total) by mouth 3 (three) times daily as needed for cough.  ? Blood Glucose Monitoring Suppl (ACCU-CHEK AVIVA PLUS) w/Device KIT Please check you blood sugars 4 times a day  ? CIPRODEX OTIC suspension Place into the right ear.  ? DERMA-SMOOTHE/FS SCALP 0.01 % OIL Apply topically.  ? fluconazole (DIFLUCAN) 150 MG tablet TAKE 1 TABLET BY MOUTH NOW. THEN REPEAT IN 1 WEEK  ? furosemide (LASIX) 20 MG tablet Take 20 mg by mouth daily as needed for fluid or edema.  ? glucose blood (ACCU-CHEK SMARTVIEW) test strip TEST FOUR TIMES DAILY  ? HUMULIN R U-500 KWIKPEN 500 UNIT/ML kwikpen Inject into the skin.  ? hydrochlorothiazide (HYDRODIURIL) 12.5 MG tablet  Take by mouth.  ? Insulin Pen Needle (B-D UF III MINI PEN NEEDLES) 31G X 5 MM MISC USE AS DIRECTED TO INJECT INSULIN FIVE A DAY E11.65  ? lactulose (CEPHULAC) 10 g packet Take 1 packet (10 g total) by mouth daily as needed. Reported on 07/29/2015 (Patient taking differently: Take 10 g by mouth daily as needed (for constipation).)  ? Lancets (ACCU-CHEK SOFT TOUCH) lancets Use as instructed  ? loratadine (CLARITIN) 10 MG tablet Take 1 tablet (10 mg total) by mouth 2 (two) times daily.  ? methocarbamol (ROBAXIN) 500 MG tablet Take 1 tablet (500 mg total) by mouth every 8 (eight) hours as needed. (Patient not taking: Reported on 01/05/2021)  ? mometasone-formoterol (DULERA) 200-5 MCG/ACT AERO Inhale 2 puffs into the lungs 2 (two) times daily.  ? montelukast (SINGULAIR) 10 MG tablet Take 1 tablet (10 mg total) by mouth at bedtime.  ? nitroGLYCERIN (NITRODUR - DOSED IN MG/24 HR) 0.2 mg/hr patch Apply 1/4th patch to affected achilles, change daily  ? omeprazole (PRILOSEC) 20 MG capsule Take 1 capsule (20 mg total) by mouth daily before breakfast.  ? SPIRIVA RESPIMAT 1.25 MCG/ACT AERS SMARTSIG:2 Puff(s) Via Inhaler Daily  ? topiramate (TOPAMAX) 25 MG tablet Take by mouth.  ? ?Current Facility-Administered Medications (Other)  ?Medication Route  ? diphenhydrAMINE (BENADRYL) injection 12.5 mg Intramuscular  ? ?REVIEW OF SYSTEMS: ? ? ? ?ALLERGIES ?Allergies  ?Allergen Reactions  ? Bee Venom Anaphylaxis and Swelling  ? Wasp  Venom Anaphylaxis and Swelling  ? Citrus Hives and Itching  ? Latex Hives and Swelling  ? Penicillins Hives and Itching  ?  Has patient had a PCN reaction causing immediate rash, facial/tongue/throat swelling, SOB or lightheadedness with hypotension: Yes ?Has patient had a PCN reaction causing severe rash involving mucus membranes or skin necrosis: Unk ?Has patient had a PCN reaction that required hospitalization: Was already in hosp ?Has patient had a PCN reaction occurring within the last 10 years: No ?If  all of the above answers are "NO", then may proceed with Cephalosporin use. ?  ? Shellfish Allergy Other (See Comments) and Swelling  ?  Patient is uncertain; stated that she tolerates this (??)  ? Soap Hives, Itching and Swelling  ?  PUREX LAUNDRY DETERGENT  ? Tomato Hives and Itching  ? Banana Itching and Nausea Only  ? Chocolate Itching  ? Fluticasone Other (See Comments)  ?  Per patient, makes sneeze and gag  ? Hydrocodone Itching and Nausea Only  ? Other Itching  ?  Acidic foods- Itching ?[place]- Itching in the roof of the mouth  ? Adhesive [Tape] Itching and Rash  ?  Allergic to  "leads"  ? Doxycycline Other (See Comments)  ?  AFFECTS JOINTS  ? Liraglutide Nausea Only  ? Metformin And Related Nausea Only, Rash, Other (See Comments) and Diarrhea  ?  GI UPSET, also  ? Red Dye Itching, Rash and Other (See Comments)  ?  At eye doctor's office, the dilating drops- Skin breaks out, chest turned red, itching, face became swollen  ? ?PAST MEDICAL HISTORY ?Past Medical History:  ?Diagnosis Date  ? Acute nonintractable headache 12/26/2017  ? Arthritis   ? major joints  ? Asthma   ? prn inhaler  ? Asthma exacerbation 07/25/2013  ? Asthma in adult, severe persistent, with acute exacerbation 01/11/2018  ? Carpal tunnel syndrome of right wrist 09/2012  ? Cataract   ? NS OU  ? Complication of anesthesia   ? states is hard to wake up post-op  ? Constipation 12/30/2014  ? Diabetes mellitus   ? intolerant to Metformin  ? Diabetic retinopathy ([place])   ? NPDR OU  ? Dry skin   ? hands   ? Family history of anesthesia complication   ? mother went into coma during c-section  ? Genital herpes 01/01/2006  ? on acyclovir BID   ? GERD (gastroesophageal reflux disease)   ? Herpes genital   ? Hyperglycemia 12/26/2017  ? Hypertensive retinopathy   ? OU  ? Low back pain 11/22/2012  ? Lower extremity edema 10/18/2012  ? Menorrhagia 11/06/2015  ? Morbid obesity with BMI of 50 to 60 07/23/2009  ? Obesity hypoventilation syndrome ([place]) 10/30/2012  ?  Obesity, morbid ([place]) 01/10/2014  ? OSA on CPAP   ?  AHI was on  11-17-12 to 120/hr, titrated to 15 cm with 3 cm EPR/   ? PCOS (polycystic ovarian syndrome)   ? Rheumatoid arthritis ([place])   ? Right ankle pain 06/19/2015  ? Right hip pain 09/04/2017  ? Right shoulder pain 06/01/2017  ? Seasonal allergies   ? Sleep apnea, obstructive   ? uses CPAP nightly - had sleep study 08/22/2007  ? ?Past Surgical History:  ?Procedure Laterality Date  ? CARPAL TUNNEL RELEASE Right 10/02/2012  ? Procedure: RIGHT CARPAL TUNNEL RELEASE ENDOSCOPIC;  Surgeon: Jolyn Nap, MD;  Location: [facility];  Service: Orthopedics;  Laterality: Right;  ? CHOLECYSTECTOMY N/A 12/02/2014  ?  Procedure: LAPAROSCOPIC CHOLECYSTECTOMY;  Surgeon: Armandina Gemma, MD;  Location: [facility];  Service: General;  Laterality: N/A;  ? HYSTEROSCOPY WITH D & C  01-08-2002  ? ?FAMILY HISTORY ?Family History  ?Problem Relation Age of Onset  ? Other Mother   ? Diabetes Maternal Aunt   ? Heart attack Maternal Aunt   ? Alzheimer's disease Maternal Uncle   ? Cancer Maternal Uncle   ?     prostate  ? Diabetes Paternal Aunt   ? Cancer Paternal Aunt   ?     brain,mouth  ? Alzheimer's disease Paternal Aunt   ? Colon polyps Paternal Uncle   ? Diabetes Paternal Uncle   ? Diabetes Maternal Grandmother   ? Diabetes Maternal Grandfather   ? Leukemia Cousin   ? Other Other   ? Obesity Other   ? Other Other   ? Colitis Neg Hx   ? ?SOCIAL HISTORY ?Social History  ? ?Tobacco Use  ? Smoking status: Former  ?  Years: 10.00  ?  Types: Cigarettes  ?  Quit date: 05/24/2010  ?  Years since quitting: 11.0  ? Smokeless tobacco: Never  ?Vaping Use  ? Vaping Use: Never used  ?Substance Use Topics  ? Alcohol use: Yes  ?  Alcohol/week: 0.0 standard drinks  ?  Comment: socially  ? Drug use: No  ?  ? ?  ?OPHTHALMIC EXAM: ?Not recorded ?  ? ?IMAGING AND PROCEDURES  ?Imaging and Procedures for @TODAY @ ? ? ?  ?  ? ?  ?ASSESSMENT/PLAN: ? ?  ICD-10-CM   ?1. Moderate nonproliferative diabetic  retinopathy of both eyes with macular edema associated with type 2 diabetes mellitus ([place])  C94.7096   ?  ?2. Essential hypertension  I10   ?  ?3. Hypertensive retinopathy of both eyes  H35.033   ?  ?4. Nuclear scle

## 2021-07-03 ENCOUNTER — Encounter (INDEPENDENT_AMBULATORY_CARE_PROVIDER_SITE_OTHER): Payer: Self-pay

## 2021-07-03 ENCOUNTER — Encounter (INDEPENDENT_AMBULATORY_CARE_PROVIDER_SITE_OTHER): Payer: Medicaid Other | Admitting: Ophthalmology

## 2021-07-03 DIAGNOSIS — I10 Essential (primary) hypertension: Secondary | ICD-10-CM

## 2021-07-03 DIAGNOSIS — H35033 Hypertensive retinopathy, bilateral: Secondary | ICD-10-CM

## 2021-07-03 DIAGNOSIS — H2513 Age-related nuclear cataract, bilateral: Secondary | ICD-10-CM

## 2021-07-03 DIAGNOSIS — E113313 Type 2 diabetes mellitus with moderate nonproliferative diabetic retinopathy with macular edema, bilateral: Secondary | ICD-10-CM

## 2021-07-03 NOTE — Progress Notes (Shared)
?[facility] Note ? ?07/06/2021 ? ?  ? ?CHIEF COMPLAINT ?Patient presents for No chief complaint on file. ? ?HISTORY OF PRESENT ILLNESS: ?Ana Long is a 46 y.o. female who presents to the clinic today for:  ? ? ? ?Pt here for IVA OD today ? ?Referring physician: ?[name] MD ?[address] ?[place], [place] [address] ? ?HISTORICAL INFORMATION:  ? ?Selected notes from the [facility] ?Re-referred by Dr. [name] for DM exam  ? ?CURRENT MEDICATIONS: ?Current Outpatient Medications (Ophthalmic Drugs)  ?Medication Sig  ? Olopatadine HCl 0.2 % SOLN Place one drop into both eyes daily.  ? ?No current facility-administered medications for this visit. (Ophthalmic Drugs)  ? ?Current Outpatient Medications (Other)  ?Medication Sig  ? acyclovir (ZOVIRAX) 400 MG tablet TAKE 1 TABLET BY MOUTH TWICE A DAY  ? albuterol (PROAIR HFA) 108 (90 Base) MCG/ACT inhaler Inhale 2 puffs into the lungs every 4 (four) hours as needed for wheezing or shortness of breath (cough). (Patient taking differently: Inhale 2 puffs into the lungs every 4 (four) hours as needed for wheezing or shortness of breath (or coughing).)  ? atorvastatin (LIPITOR) 10 MG tablet Take 10 mg by mouth daily.  ? benzonatate (TESSALON) 200 MG capsule Take 1 capsule (200 mg total) by mouth 3 (three) times daily as needed for cough.  ? Blood Glucose Monitoring Suppl (ACCU-CHEK AVIVA PLUS) w/Device KIT Please check you blood sugars 4 times a day  ? CIPRODEX OTIC suspension Place into the right ear.  ? DERMA-SMOOTHE/FS SCALP 0.01 % OIL Apply topically.  ? fluconazole (DIFLUCAN) 150 MG tablet TAKE 1 TABLET BY MOUTH NOW. THEN REPEAT IN 1 WEEK  ? furosemide (LASIX) 20 MG tablet Take 20 mg by mouth daily as needed for fluid or edema.  ? glucose blood (ACCU-CHEK SMARTVIEW) test strip TEST FOUR TIMES DAILY  ? HUMULIN R U-500 KWIKPEN 500 UNIT/ML kwikpen Inject into the skin.  ? hydrochlorothiazide (HYDRODIURIL) 12.5 MG tablet  Take by mouth.  ? Insulin Pen Needle (B-D UF III MINI PEN NEEDLES) 31G X 5 MM MISC USE AS DIRECTED TO INJECT INSULIN FIVE A DAY E11.65  ? lactulose (CEPHULAC) 10 g packet Take 1 packet (10 g total) by mouth daily as needed. Reported on 07/29/2015 (Patient taking differently: Take 10 g by mouth daily as needed (for constipation).)  ? Lancets (ACCU-CHEK SOFT TOUCH) lancets Use as instructed  ? loratadine (CLARITIN) 10 MG tablet Take 1 tablet (10 mg total) by mouth 2 (two) times daily.  ? methocarbamol (ROBAXIN) 500 MG tablet Take 1 tablet (500 mg total) by mouth every 8 (eight) hours as needed. (Patient not taking: Reported on 01/05/2021)  ? mometasone-formoterol (DULERA) 200-5 MCG/ACT AERO Inhale 2 puffs into the lungs 2 (two) times daily.  ? montelukast (SINGULAIR) 10 MG tablet Take 1 tablet (10 mg total) by mouth at bedtime.  ? nitroGLYCERIN (NITRODUR - DOSED IN MG/24 HR) 0.2 mg/hr patch Apply 1/4th patch to affected achilles, change daily  ? omeprazole (PRILOSEC) 20 MG capsule Take 1 capsule (20 mg total) by mouth daily before breakfast.  ? SPIRIVA RESPIMAT 1.25 MCG/ACT AERS SMARTSIG:2 Puff(s) Via Inhaler Daily  ? topiramate (TOPAMAX) 25 MG tablet Take by mouth.  ? ?Current Facility-Administered Medications (Other)  ?Medication Route  ? diphenhydrAMINE (BENADRYL) injection 12.5 mg Intramuscular  ? ?REVIEW OF SYSTEMS: ? ? ? ?ALLERGIES ?Allergies  ?Allergen Reactions  ? Bee Venom Anaphylaxis and Swelling  ? Wasp  Venom Anaphylaxis and Swelling  ? Citrus Hives and Itching  ? Latex Hives and Swelling  ? Penicillins Hives and Itching  ?  Has patient had a PCN reaction causing immediate rash, facial/tongue/throat swelling, SOB or lightheadedness with hypotension: Yes ?Has patient had a PCN reaction causing severe rash involving mucus membranes or skin necrosis: Unk ?Has patient had a PCN reaction that required hospitalization: Was already in hosp ?Has patient had a PCN reaction occurring within the last 10 years: No ?If  all of the above answers are "NO", then may proceed with Cephalosporin use. ?  ? Shellfish Allergy Other (See Comments) and Swelling  ?  Patient is uncertain; stated that she tolerates this (??)  ? Soap Hives, Itching and Swelling  ?  PUREX LAUNDRY DETERGENT  ? Tomato Hives and Itching  ? Banana Itching and Nausea Only  ? Chocolate Itching  ? Fluticasone Other (See Comments)  ?  Per patient, makes sneeze and gag  ? Hydrocodone Itching and Nausea Only  ? Other Itching  ?  Acidic foods- Itching ?[place]- Itching in the roof of the mouth  ? Adhesive [Tape] Itching and Rash  ?  Allergic to  "leads"  ? Doxycycline Other (See Comments)  ?  AFFECTS JOINTS  ? Liraglutide Nausea Only  ? Metformin And Related Nausea Only, Rash, Other (See Comments) and Diarrhea  ?  GI UPSET, also  ? Red Dye Itching, Rash and Other (See Comments)  ?  At eye doctor's office, the dilating drops- Skin breaks out, chest turned red, itching, face became swollen  ? ?PAST MEDICAL HISTORY ?Past Medical History:  ?Diagnosis Date  ? Acute nonintractable headache 12/26/2017  ? Arthritis   ? major joints  ? Asthma   ? prn inhaler  ? Asthma exacerbation 07/25/2013  ? Asthma in adult, severe persistent, with acute exacerbation 01/11/2018  ? Carpal tunnel syndrome of right wrist 09/2012  ? Cataract   ? NS OU  ? Complication of anesthesia   ? states is hard to wake up post-op  ? Constipation 12/30/2014  ? Diabetes mellitus   ? intolerant to Metformin  ? Diabetic retinopathy ([place])   ? NPDR OU  ? Dry skin   ? hands   ? Family history of anesthesia complication   ? mother went into coma during c-section  ? Genital herpes 01/01/2006  ? on acyclovir BID   ? GERD (gastroesophageal reflux disease)   ? Herpes genital   ? Hyperglycemia 12/26/2017  ? Hypertensive retinopathy   ? OU  ? Low back pain 11/22/2012  ? Lower extremity edema 10/18/2012  ? Menorrhagia 11/06/2015  ? Morbid obesity with BMI of 50 to 60 07/23/2009  ? Obesity hypoventilation syndrome ([place]) 10/30/2012  ?  Obesity, morbid ([place]) 01/10/2014  ? OSA on CPAP   ?  AHI was on  11-17-12 to 120/hr, titrated to 15 cm with 3 cm EPR/   ? PCOS (polycystic ovarian syndrome)   ? Rheumatoid arthritis ([place])   ? Right ankle pain 06/19/2015  ? Right hip pain 09/04/2017  ? Right shoulder pain 06/01/2017  ? Seasonal allergies   ? Sleep apnea, obstructive   ? uses CPAP nightly - had sleep study 08/22/2007  ? ?Past Surgical History:  ?Procedure Laterality Date  ? CARPAL TUNNEL RELEASE Right 10/02/2012  ? Procedure: RIGHT CARPAL TUNNEL RELEASE ENDOSCOPIC;  Surgeon: Jolyn Nap, MD;  Location: [facility];  Service: Orthopedics;  Laterality: Right;  ? CHOLECYSTECTOMY N/A 12/02/2014  ?  Procedure: LAPAROSCOPIC CHOLECYSTECTOMY;  Surgeon: Armandina Gemma, MD;  Location[facility];  Service: General;  Laterality: N/A;  ? HYSTEROSCOPY WITH D & C  01-08-2002  ? ?FAMILY HISTORY ?Family History  ?Problem Relation Age of Onset  ? Other Mother   ? Diabetes Maternal Aunt   ? Heart attack Maternal Aunt   ? Alzheimer's disease Maternal Uncle   ? Cancer Maternal Uncle   ?     prostate  ? Diabetes Paternal Aunt   ? Cancer Paternal Aunt   ?     brain,mouth  ? Alzheimer's disease Paternal Aunt   ? Colon polyps Paternal Uncle   ? Diabetes Paternal Uncle   ? Diabetes Maternal Grandmother   ? Diabetes Maternal Grandfather   ? Leukemia Cousin   ? Other Other   ? Obesity Other   ? Other Other   ? Colitis Neg Hx   ? ?SOCIAL HISTORY ?Social History  ? ?Tobacco Use  ? Smoking status: Former  ?  Years: 10.00  ?  Types: Cigarettes  ?  Quit date: 05/24/2010  ?  Years since quitting: 11.1  ? Smokeless tobacco: Never  ?Vaping Use  ? Vaping Use: Never used  ?Substance Use Topics  ? Alcohol use: Yes  ?  Alcohol/week: 0.0 standard drinks  ?  Comment: socially  ? Drug use: No  ?  ? ?  ?OPHTHALMIC EXAM: ?Not recorded ?  ? ?IMAGING AND PROCEDURES  ?Imaging and Procedures for _0 @ ? ? ?  ?  ? ?  ?ASSESSMENT/PLAN: ? ?  ICD-10-CM   ?1. 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  ?2. Essential hypertension  I10   ?  ?3. Hypertensive retinopathy of both eyes  H35.033   ?  ?4. Nuclear scle

## 2021-07-06 ENCOUNTER — Encounter (INDEPENDENT_AMBULATORY_CARE_PROVIDER_SITE_OTHER): Payer: Medicaid Other | Admitting: Ophthalmology

## 2021-07-06 DIAGNOSIS — I10 Essential (primary) hypertension: Secondary | ICD-10-CM

## 2021-07-06 DIAGNOSIS — H2513 Age-related nuclear cataract, bilateral: Secondary | ICD-10-CM

## 2021-07-06 DIAGNOSIS — E113313 Type 2 diabetes mellitus with moderate nonproliferative diabetic retinopathy with macular edema, bilateral: Secondary | ICD-10-CM

## 2021-07-06 DIAGNOSIS — H35033 Hypertensive retinopathy, bilateral: Secondary | ICD-10-CM

## 2021-07-06 NOTE — Progress Notes (Signed)
[facility] Note  07/10/2021     CHIEF COMPLAINT Patient presents for Retina Follow Up  HISTORY OF PRESENT ILLNESS: Ana Long is a 46 y.o. female who presents to the clinic today for:   HPI     Retina Follow Up   Patient presents with  Diabetic Retinopathy (IVA OD #12 (04.07.23)).  In both eyes.  This started months ago.  I, the attending physician,  performed the HPI with the patient and updated documentation appropriately.        Comments   Patient states that she sees shadows of things but its not like floaters. Her blood sugar this morning was 208 and her A1C 7.6. Patient has a problem with glare.      Last edited by Bernarda Caffey, MD on 07/14/2021  1:36 AM.    Patient is at 6 weeks this visit due to transportation  Referring physician: [name] MD [place], [place] [address]  HISTORICAL INFORMATION:   Selected notes from the [facility] Re-referred by Dr. [name] for DM exam   CURRENT MEDICATIONS: Current Outpatient Medications (Ophthalmic Drugs)  Medication Sig   Olopatadine HCl 0.2 % SOLN Place one drop into both eyes daily.   No current facility-administered medications for this visit. (Ophthalmic Drugs)   Current Outpatient Medications (Other)  Medication Sig   acyclovir (ZOVIRAX) 400 MG tablet TAKE 1 TABLET BY MOUTH TWICE A DAY   albuterol (PROAIR HFA) 108 (90 Base) MCG/ACT inhaler Inhale 2 puffs into the lungs every 4 (four) hours as needed for wheezing or shortness of breath (cough). (Patient taking differently: Inhale 2 puffs into the lungs every 4 (four) hours as needed for wheezing or shortness of breath (or coughing).)   atorvastatin (LIPITOR) 10 MG tablet Take 10 mg by mouth daily.   benzonatate (TESSALON) 200 MG capsule Take 1 capsule (200 mg total) by mouth 3 (three) times daily as needed for cough.   Blood Glucose Monitoring Suppl (ACCU-CHEK AVIVA PLUS) w/Device KIT Please  check you blood sugars 4 times a day   CIPRODEX OTIC suspension Place into the right ear.   DERMA-SMOOTHE/FS SCALP 0.01 % OIL Apply topically.   fluconazole (DIFLUCAN) 150 MG tablet TAKE 1 TABLET BY MOUTH NOW. THEN REPEAT IN 1 WEEK   furosemide (LASIX) 20 MG tablet Take 20 mg by mouth daily as needed for fluid or edema.   glucose blood (ACCU-CHEK SMARTVIEW) test strip TEST FOUR TIMES DAILY   HUMULIN R U-500 KWIKPEN 500 UNIT/ML kwikpen Inject into the skin.   hydrochlorothiazide (HYDRODIURIL) 12.5 MG tablet Take by mouth.   Insulin Pen Needle (B-D UF III MINI PEN NEEDLES) 31G X 5 MM MISC USE AS DIRECTED TO INJECT INSULIN FIVE A DAY E11.65   lactulose (CEPHULAC) 10 g packet Take 1 packet (10 g total) by mouth daily as needed. Reported on 07/29/2015 (Patient taking differently: Take 10 g by mouth daily as needed (for constipation).)   Lancets (ACCU-CHEK SOFT TOUCH) lancets Use as instructed   loratadine (CLARITIN) 10 MG tablet Take 1 tablet (10 mg total) by mouth 2 (two) times daily.   methocarbamol (ROBAXIN) 500 MG tablet Take 1 tablet (500 mg total) by mouth every 8 (eight) hours as needed.   mometasone-formoterol (DULERA) 200-5 MCG/ACT AERO Inhale 2 puffs into the lungs 2 (two) times daily.   montelukast (SINGULAIR) 10 MG tablet Take 1 tablet (10 mg total) by mouth at bedtime.  nitroGLYCERIN (NITRODUR - DOSED IN MG/24 HR) 0.2 mg/hr patch Apply 1/4th patch to affected achilles, change daily   omeprazole (PRILOSEC) 20 MG capsule Take 1 capsule (20 mg total) by mouth daily before breakfast.   SPIRIVA RESPIMAT 1.25 MCG/ACT AERS SMARTSIG:2 Puff(s) Via Inhaler Daily   topiramate (TOPAMAX) 25 MG tablet Take by mouth.   Current Facility-Administered Medications (Other)  Medication Route   diphenhydrAMINE (BENADRYL) injection 12.5 mg Intramuscular   REVIEW OF SYSTEMS: ROS   Positive for: Gastrointestinal, Endocrine, Eyes, Respiratory Negative for: Constitutional, Neurological, Skin, Genitourinary,  Musculoskeletal, HENT, Cardiovascular, Psychiatric, Allergic/Imm, Heme/Lymph Last edited by Annie Paras, COT on 07/10/2021  1:42 PM.     ALLERGIES Allergies  Allergen Reactions   Bee Venom Anaphylaxis and Swelling   Wasp Venom Anaphylaxis and Swelling   Citrus Hives and Itching   Latex Hives and Swelling   Penicillins Hives and Itching    Has patient had a PCN reaction causing immediate rash, facial/tongue/throat swelling, SOB or lightheadedness with hypotension: Yes Has patient had a PCN reaction causing severe rash involving mucus membranes or skin necrosis: Unk Has patient had a PCN reaction that required hospitalization: Was already in hosp Has patient had a PCN reaction occurring within the last 10 years: No If all of the above answers are "NO", then may proceed with Cephalosporin use.    Shellfish Allergy Other (See Comments) and Swelling    Patient is uncertain; stated that she tolerates this (??)   Soap Hives, Itching and Swelling    PUREX LAUNDRY DETERGENT   Tomato Hives and Itching   Banana Itching and Nausea Only   Chocolate Itching   Fluticasone Other (See Comments)    Per patient, makes sneeze and gag   Hydrocodone Itching and Nausea Only   Other Itching    Acidic foods- Itching [place]- Itching in the roof of the mouth   Adhesive [Tape] Itching and Rash    Allergic to  "leads"   Doxycycline Other (See Comments)    AFFECTS JOINTS   Liraglutide Nausea Only   Metformin And Related Nausea Only, Rash, Other (See Comments) and Diarrhea    GI UPSET, also   Red Dye Itching, Rash and Other (See Comments)    At eye doctor's office, the dilating drops- Skin breaks out, chest turned red, itching, face became swollen   PAST MEDICAL HISTORY Past Medical History:  Diagnosis Date   Acute nonintractable headache 12/26/2017   Arthritis    major joints   Asthma    prn inhaler   Asthma exacerbation 07/25/2013   Asthma in adult, severe persistent, with acute  exacerbation 01/11/2018   Carpal tunnel syndrome of right wrist 09/2012   Cataract    NS OU   Complication of anesthesia    states is hard to wake up post-op   Constipation 12/30/2014   Diabetes mellitus    intolerant to Metformin   Diabetic retinopathy ([place])    NPDR OU   Dry skin    hands    Family history of anesthesia complication    mother went into coma during c-section   Genital herpes 01/01/2006   on acyclovir BID    GERD (gastroesophageal reflux disease)    Herpes genital    Hyperglycemia 12/26/2017   Hypertensive retinopathy    OU   Low back pain 11/22/2012   Lower extremity edema 10/18/2012   Menorrhagia 11/06/2015   Morbid obesity with BMI of 50 to 60 07/23/2009   Obesity hypoventilation syndrome (  [place]) 10/30/2012   Obesity, morbid ([place]) 01/10/2014   OSA on CPAP     AHI was on  11-17-12 to 120/hr, titrated to 15 cm with 3 cm EPR/    PCOS (polycystic ovarian syndrome)    Rheumatoid arthritis ([place])    Right ankle pain 06/19/2015   Right hip pain 09/04/2017   Right shoulder pain 06/01/2017   Seasonal allergies    Sleep apnea, obstructive    uses CPAP nightly - had sleep study 08/22/2007   Past Surgical History:  Procedure Laterality Date   CARPAL TUNNEL RELEASE Right 10/02/2012   Procedure: RIGHT CARPAL TUNNEL RELEASE ENDOSCOPIC;  Surgeon: Jolyn Nap, MD;  Location: [facility];  Service: Orthopedics;  Laterality: Right;   CHOLECYSTECTOMY N/A 12/02/2014   Procedure: LAPAROSCOPIC CHOLECYSTECTOMY;  Surgeon: Armandina Gemma, MD;  Location: [facility];  Service: General;  Laterality: N/A;   HYSTEROSCOPY WITH D & C  01-08-2002   FAMILY HISTORY Family History  Problem Relation Age of Onset   Other Mother    Diabetes Maternal Aunt    Heart attack Maternal Aunt    Alzheimer's disease Maternal Uncle    Cancer Maternal Uncle        prostate   Diabetes Paternal Aunt    Cancer Paternal Aunt        brain,mouth   Alzheimer's disease Paternal Aunt    Colon polyps  Paternal Uncle    Diabetes Paternal Uncle    Diabetes Maternal Grandmother    Diabetes Maternal Grandfather    Leukemia Cousin    Other Other    Obesity Other    Other Other    Colitis Neg Hx    SOCIAL HISTORY Social History   Tobacco Use   Smoking status: Former    Years: 10.00    Types: Cigarettes    Quit date: 05/24/2010    Years since quitting: 11.1   Smokeless tobacco: Never  Vaping Use   Vaping Use: Never used  Substance Use Topics   Alcohol use: Yes    Alcohol/week: 0.0 standard drinks    Comment: socially   Drug use: No       OPHTHALMIC EXAM: Base Eye Exam     Visual Acuity (Snellen - Linear)       Right Left   Dist cc 20/30 20/30   Dist ph cc NI NI    Correction: Glasses         Tonometry (Tonopen, 1:50 PM)       Right Left   Pressure 20 21         Pupils       Dark Light Shape React APD   Right 3 2 Round Brisk None   Left 3 2 Round Brisk None         Visual Fields       Left Right    Full Full         Extraocular Movement       Right Left    Full Full         Neuro/Psych     Oriented x3: Yes   Mood/Affect: Normal         Dilation     Both eyes: 1.0% Mydriacyl, 2.5% Phenylephrine @ 1:44 PM           Additional Tests     Glare Testing       High   Right 20/50   Left 20/50  Slit Lamp and Fundus Exam     Slit Lamp Exam       Right Left   Lids/Lashes Dermatochalasis - upper lid, mild Meibomian gland dysfunction Dermatochalasis - upper lid, mild Meibomian gland dysfunction   Conjunctiva/Sclera White and quiet White and quiet   Cornea 1-2+Punctate epithelial erosions 2-3+fine Punctate epithelial erosions   Anterior Chamber deep and quiet deep and quiet   Iris round and dilated, No NVI round and dilated   Lens 1+ NS, 1+CC 1+NS, 1+CC   Anterior Vitreous mild vitreous syneresis mild vitreous syneresis, scattered vitreous condensations         Fundus Exam       Right Left   Disc Pink  and sharp, +cupping Pink and sharp, +cupping   C/D Ratio 0.7 0.6   Macula good foveal reflex, focal exudates ST macula, scattered MA, focal cystic changes ST macula - persistent, focal laser changes inferior macula good foveal reflex, scatttered MA's and exudate, central cystic changes -- persistent, focal cluster of exudates SN macula -  persistent, small cluster of MA central macula   Vessels attenuated, Tortuous Vascular attenuation, Tortuous, Copper wiring   Periphery Attached, scattered MA/exudates greatest nasally Attached, scattered MA and exudate greatest superior to disc, white without pressure temporally           Refraction     Wearing Rx       Sphere Cylinder Axis   Right -2.50 +1.50 013   Left -2.25 Sphere     Type: SVL           IMAGING AND PROCEDURES  Imaging and Procedures for @TODAY @  OCT, Retina - OU - Both Eyes       Right Eye Quality was good. Central Foveal Thickness: 226. Progression has been stable. Findings include no SRF, intraretinal fluid, intraretinal hyper-reflective material, vitreomacular adhesion , normal foveal contour (Persistent IRF/cystic changes temporal macula ).   Left Eye Quality was good. Central Foveal Thickness: 277. Progression has worsened. Findings include intraretinal fluid, no SRF, abnormal foveal contour, intraretinal hyper-reflective material (Mild interval increase in cystic changes SN macula and fovea, persistent prominent cyst nasal macula).   Notes *Images captured and stored on drive  Diagnosis / Impression:  +DME OU OD: Persistent IRF/cystic changes temporal macula  OS: Mild interval increase in cystic changes SN macula and fovea, persistent prominent cyst nasal macula  Clinical management:  See below  Abbreviations: NFP - Normal foveal profile. CME - cystoid macular edema. PED - pigment epithelial detachment. IRF - intraretinal fluid. SRF - subretinal fluid. EZ - ellipsoid zone. ERM - epiretinal membrane. ORA -  outer retinal atrophy. ORT - outer retinal tubulation. SRHM - subretinal hyper-reflective material      Intravitreal Injection, Pharmacologic Agent - OS - Left Eye       Time Out 07/10/2021. 2:16 PM. Confirmed correct patient, procedure, site, and patient consented.   Anesthesia Topical anesthesia was used. Anesthetic medications included Lidocaine 2%, Proparacaine 0.5%.   Procedure Preparation included 5% betadine to ocular surface, eyelid speculum. A supplied (32g) needle was used.   Injection: 1.25 mg Bevacizumab 1.28m/0.05ml   Route: Intravitreal, Site: Left Eye   NDC:: 07371-062-69 Lot:: [id number] Expiration date: 09/06/2021   Post-op Post injection exam found visual acuity of at least counting fingers. The patient tolerated the procedure well. There were no complications. The patient received written and verbal post procedure care education. Post injection medications were not given.  ASSESSMENT/PLAN:    ICD-10-CM   1. Moderate nonproliferative diabetic retinopathy of both eyes with macular edema associated with type 2 diabetes mellitus (HCC)  E11.3313 OCT, Retina - OU - Both Eyes    Intravitreal Injection, Pharmacologic Agent - OS - Left Eye    Bevacizumab (AVASTIN) SOLN 1.25 mg    CANCELED: Intravitreal Injection, Pharmacologic Agent - OD - Right Eye    2. Essential hypertension  I10     3. Hypertensive retinopathy of both eyes  H35.033     4. Nuclear sclerosis of both eyes  H25.13      1.  Moderate non-proliferative diabetic retinopathy w/ DME, OU  - pt lost to f/u from 10.23.2019 to 11.18.20  - FA in 2019 showed leaking MA OU; no NV OU  **of note, pt had pruritic rxn to fluorescein dye -- resolved with 12.5 mg IV benadryl**  - FA 12.16.2020 shows late leaking MA's, non-perfusion defects; no NV OU  - s/p IVA OD #1 (12.16.20), #2 (01.25.21), #3 (05.26.21), #4 (07.02.21), #5 (10.15.21), #6 (11.23.21), #7 (07.27.22), #8 (09.09.22), #9 (11.11.22), #10  (01.20.23), #11 (02.23.23), #12 (04.09.23)  - s/p focal laser OD (03.19.21), (04.01.22)  - s/p IVA OS #1 OS (12.23.20), #2 (01.27.21), #3 (03.02.21), #4 (05.26.21), #5 (07.02.21), #6 (10.15.21), #7 (11.23.21), #8 (01.11.22), #9 (03.04.22), #10 (04.27.22), #11 (11.14.22), #12 (01.10.23), #13 (02.20.23), #14 (04.05.23)  - exam shows scattered MA, IRH, exudates and mild macular edema OU  - BCVA 20/30 OU  - OCT shows OD: Persistent IRF/cystic changes temporal macula; OS: Mild interval increase in cystic changes SN macula and fovea, persistent prominent cyst nasal macula at              - Recommend IVA OU today, 5.19.23, but pt wishes to treat OS only today, OD next wek  - pt wishes to proceed with IVA OS #15 - RBA of procedure discussed, questions answered - see procedure note  - Avastin informed consent form re-signed and scanned on 05.19.2023  - f/u 1 wk --IVA OD  2,3. Hypertensive retinopathy OU  - discussed importance of tight BP control  - monitor   4. Nuclear Sclerosis   - The symptoms of cataract, surgical options, and treatments and risks were discussed with patient.   - discussed diagnosis and progression  - under the expert management of [place] Ordered this visit:  Meds ordered this encounter  Medications   Bevacizumab (AVASTIN) SOLN 1.25 mg     Return in about 6 weeks (around 08/21/2021) for f/u NPDR OU, DFE, possible injection.  There are no Patient Instructions on file for this visit.  Explained the diagnoses, plan, and follow up with the patient and they expressed understanding.  Patient expressed understanding of the importance of proper follow up care.  This document serves as a record of services personally performed by Gardiner Sleeper, MD, PhD. It was created on their behalf by Leonie Douglas, an ophthalmic technician. The creation of this record is the provider's dictation and/or activities during the visit.    Electronically signed by: Leonie Douglas COA, 07/14/21  1:46 AM   Gardiner Sleeper, M.D., Ph.D. Diseases & Surgery of the Retina and Vitreous [facility]  I have reviewed the above documentation for accuracy and completeness, and I agree with the above. Gardiner Sleeper, M.D., Ph.D. 07/14/21 1:52 AM   Abbreviations: M myopia (nearsighted); A astigmatism; H hyperopia (farsighted); P presbyopia; Mrx spectacle  prescription;  CTL contact lenses; OD right eye; OS left eye; OU both eyes  XT exotropia; ET esotropia; PEK punctate epithelial keratitis; PEE punctate epithelial erosions; DES dry eye syndrome; MGD meibomian gland dysfunction; ATs artificial tears; PFAT's preservative free artificial tears; [place] nuclear sclerotic cataract; PSC posterior subcapsular cataract; ERM epi-retinal membrane; PVD posterior vitreous detachment; RD retinal detachment; DM diabetes mellitus; DR diabetic retinopathy; NPDR non-proliferative diabetic retinopathy; PDR proliferative diabetic retinopathy; CSME clinically significant macular edema; DME diabetic macular edema; dbh dot blot hemorrhages; CWS cotton wool spot; POAG primary open angle glaucoma; C/D cup-to-disc ratio; HVF humphrey visual field; GVF goldmann visual field; OCT optical coherence tomography; IOP intraocular pressure; BRVO Branch retinal vein occlusion; CRVO central retinal vein occlusion; CRAO central retinal artery occlusion; BRAO branch retinal artery occlusion; RT retinal tear; SB scleral buckle; PPV pars plana vitrectomy; VH Vitreous hemorrhage; PRP panretinal laser photocoagulation; IVK intravitreal kenalog; VMT vitreomacular traction; MH Macular hole;  NVD neovascularization of the disc; NVE neovascularization elsewhere; AREDS age related eye disease study; ARMD age related macular degeneration; POAG primary open angle glaucoma; EBMD epithelial/anterior basement membrane dystrophy; ACIOL anterior chamber intraocular lens; IOL intraocular lens; PCIOL posterior chamber  intraocular lens; Phaco/IOL phacoemulsification with intraocular lens placement; [place] photorefractive keratectomy; LASIK laser assisted in situ keratomileusis; HTN hypertension; DM diabetes mellitus; COPD chronic obstructive pulmonary disease

## 2021-07-10 ENCOUNTER — Ambulatory Visit (INDEPENDENT_AMBULATORY_CARE_PROVIDER_SITE_OTHER): Payer: Medicaid Other | Admitting: Ophthalmology

## 2021-07-10 ENCOUNTER — Encounter (INDEPENDENT_AMBULATORY_CARE_PROVIDER_SITE_OTHER): Payer: Self-pay | Admitting: Ophthalmology

## 2021-07-10 DIAGNOSIS — I10 Essential (primary) hypertension: Secondary | ICD-10-CM

## 2021-07-10 DIAGNOSIS — H2513 Age-related nuclear cataract, bilateral: Secondary | ICD-10-CM

## 2021-07-10 DIAGNOSIS — E113313 Type 2 diabetes mellitus with moderate nonproliferative diabetic retinopathy with macular edema, bilateral: Secondary | ICD-10-CM

## 2021-07-10 DIAGNOSIS — H35033 Hypertensive retinopathy, bilateral: Secondary | ICD-10-CM | POA: Diagnosis not present

## 2021-07-14 ENCOUNTER — Encounter (INDEPENDENT_AMBULATORY_CARE_PROVIDER_SITE_OTHER): Payer: Self-pay | Admitting: Ophthalmology

## 2021-07-14 ENCOUNTER — Ambulatory Visit (INDEPENDENT_AMBULATORY_CARE_PROVIDER_SITE_OTHER): Payer: Medicaid Other | Admitting: Ophthalmology

## 2021-07-14 DIAGNOSIS — H35033 Hypertensive retinopathy, bilateral: Secondary | ICD-10-CM

## 2021-07-14 DIAGNOSIS — E113313 Type 2 diabetes mellitus with moderate nonproliferative diabetic retinopathy with macular edema, bilateral: Secondary | ICD-10-CM | POA: Diagnosis not present

## 2021-07-14 DIAGNOSIS — H2513 Age-related nuclear cataract, bilateral: Secondary | ICD-10-CM

## 2021-07-14 DIAGNOSIS — I10 Essential (primary) hypertension: Secondary | ICD-10-CM | POA: Diagnosis not present

## 2021-07-14 MED ORDER — BEVACIZUMAB CHEMO INJECTION 1.25MG/0.05ML SYRINGE FOR KALEIDOSCOPE
1.2500 mg | INTRAVITREAL | Status: AC | PRN
  Administered 2021-07-14: 1.25 mg via INTRAVITREAL

## 2021-07-14 NOTE — Progress Notes (Signed)
[facility] Note  07/14/2021     CHIEF COMPLAINT Patient presents for Retina Follow Up  HISTORY OF PRESENT ILLNESS: Ana Long is a 46 y.o. female who presents to the clinic today for:   HPI     Retina Follow Up   Patient presents with  Other.  In right eye.  This started 1 week ago.  I, the attending physician,  performed the HPI with the patient and updated documentation appropriately.        Comments   Patient here for IVA OD. Patient states vision is ok. No eye pain OS. OS is red.  Has mucus coming out. Uses warm compress.      Last edited by Bernarda Caffey, MD on 07/15/2021  8:37 AM.     Referring physician: [name] MD [place], [place] [address]  HISTORICAL INFORMATION:   Selected notes from the [facility] Re-referred by Dr. [name] for DM exam   CURRENT MEDICATIONS: Current Outpatient Medications (Ophthalmic Drugs)  Medication Sig   Olopatadine HCl 0.2 % SOLN Place one drop into both eyes daily.   No current facility-administered medications for this visit. (Ophthalmic Drugs)   Current Outpatient Medications (Other)  Medication Sig   acyclovir (ZOVIRAX) 400 MG tablet TAKE 1 TABLET BY MOUTH TWICE A DAY   albuterol (PROAIR HFA) 108 (90 Base) MCG/ACT inhaler Inhale 2 puffs into the lungs every 4 (four) hours as needed for wheezing or shortness of breath (cough). (Patient taking differently: Inhale 2 puffs into the lungs every 4 (four) hours as needed for wheezing or shortness of breath (or coughing).)   atorvastatin (LIPITOR) 10 MG tablet Take 10 mg by mouth daily.   benzonatate (TESSALON) 200 MG capsule Take 1 capsule (200 mg total) by mouth 3 (three) times daily as needed for cough.   Blood Glucose Monitoring Suppl (ACCU-CHEK AVIVA PLUS) w/Device KIT Please check you blood sugars 4 times a day   CIPRODEX OTIC suspension Place into the right ear.   DERMA-SMOOTHE/FS SCALP 0.01 % OIL Apply  topically.   fluconazole (DIFLUCAN) 150 MG tablet TAKE 1 TABLET BY MOUTH NOW. THEN REPEAT IN 1 WEEK   furosemide (LASIX) 20 MG tablet Take 20 mg by mouth daily as needed for fluid or edema.   glucose blood (ACCU-CHEK SMARTVIEW) test strip TEST FOUR TIMES DAILY   HUMULIN R U-500 KWIKPEN 500 UNIT/ML kwikpen Inject into the skin.   hydrochlorothiazide (HYDRODIURIL) 12.5 MG tablet Take by mouth.   Insulin Pen Needle (B-D UF III MINI PEN NEEDLES) 31G X 5 MM MISC USE AS DIRECTED TO INJECT INSULIN FIVE A DAY E11.65   lactulose (CEPHULAC) 10 g packet Take 1 packet (10 g total) by mouth daily as needed. Reported on 07/29/2015 (Patient taking differently: Take 10 g by mouth daily as needed (for constipation).)   Lancets (ACCU-CHEK SOFT TOUCH) lancets Use as instructed   loratadine (CLARITIN) 10 MG tablet Take 1 tablet (10 mg total) by mouth 2 (two) times daily.   methocarbamol (ROBAXIN) 500 MG tablet Take 1 tablet (500 mg total) by mouth every 8 (eight) hours as needed.   mometasone-formoterol (DULERA) 200-5 MCG/ACT AERO Inhale 2 puffs into the lungs 2 (two) times daily.   montelukast (SINGULAIR) 10 MG tablet Take 1 tablet (10 mg total) by mouth at bedtime.   nitroGLYCERIN (NITRODUR - DOSED IN MG/24 HR) 0.2 mg/hr patch Apply 1/4th patch to affected achilles, change daily  omeprazole (PRILOSEC) 20 MG capsule Take 1 capsule (20 mg total) by mouth daily before breakfast.   SPIRIVA RESPIMAT 1.25 MCG/ACT AERS SMARTSIG:2 Puff(s) Via Inhaler Daily   topiramate (TOPAMAX) 25 MG tablet Take by mouth.   Current Facility-Administered Medications (Other)  Medication Route   diphenhydrAMINE (BENADRYL) injection 12.5 mg Intramuscular   REVIEW OF SYSTEMS: ROS   Positive for: Gastrointestinal, Endocrine, Eyes, Respiratory Negative for: Constitutional, Neurological, Skin, Genitourinary, Musculoskeletal, HENT, Cardiovascular, Psychiatric, Allergic/Imm, Heme/Lymph Last edited by Theodore Demark, COA on 07/14/2021  3:16  PM.     ALLERGIES Allergies  Allergen Reactions   Bee Venom Anaphylaxis and Swelling   Wasp Venom Anaphylaxis and Swelling   Citrus Hives and Itching   Latex Hives and Swelling   Penicillins Hives and Itching    Has patient had a PCN reaction causing immediate rash, facial/tongue/throat swelling, SOB or lightheadedness with hypotension: Yes Has patient had a PCN reaction causing severe rash involving mucus membranes or skin necrosis: Unk Has patient had a PCN reaction that required hospitalization: Was already in hosp Has patient had a PCN reaction occurring within the last 10 years: No If all of the above answers are "NO", then may proceed with Cephalosporin use.    Shellfish Allergy Other (See Comments) and Swelling    Patient is uncertain; stated that she tolerates this (??)   Soap Hives, Itching and Swelling    PUREX LAUNDRY DETERGENT   Tomato Hives and Itching   Banana Itching and Nausea Only   Chocolate Itching   Fluticasone Other (See Comments)    Per patient, makes sneeze and gag   Hydrocodone Itching and Nausea Only   Other Itching    Acidic foods- Itching [place]- Itching in the roof of the mouth   Adhesive [Tape] Itching and Rash    Allergic to  "leads"   Doxycycline Other (See Comments)    AFFECTS JOINTS   Liraglutide Nausea Only   Metformin And Related Nausea Only, Rash, Other (See Comments) and Diarrhea    GI UPSET, also   Red Dye Itching, Rash and Other (See Comments)    At eye doctor's office, the dilating drops- Skin breaks out, chest turned red, itching, face became swollen   PAST MEDICAL HISTORY 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   Cataract    NS OU   Complication of anesthesia    states is hard to  wake up post-op   Constipation 12/30/2014   Diabetes mellitus    intolerant to Metformin   Diabetic retinopathy ([place])    NPDR OU   Dry skin    hands    Family history of anesthesia complication    mother went into coma during c-section   Genital herpes 01/01/2006   on acyclovir BID    GERD (gastroesophageal reflux disease)    Herpes genital    Hyperglycemia 12/26/2017   Hypertensive retinopathy    OU   Low back pain 11/22/2012   Lower extremity edema 10/18/2012   Menorrhagia 11/06/2015   Morbid obesity with BMI of 50 to 60 07/23/2009   Obesity hypoventilation syndrome ([place]) 10/30/2012   Obesity, morbid ([place]) 01/10/2014   OSA on CPAP     AHI was on  11-17-12 to 120/hr, titrated to 15 cm with 3 cm EPR/    PCOS (polycystic ovarian syndrome)    Rheumatoid arthritis ([employer])    Right ankle pain 06/19/2015   Right hip pain 09/04/2017   Right shoulder pain 06/01/2017   Seasonal allergies    Sleep apnea, obstructive    uses CPAP nightly - had sleep study 08/22/2007   Past Surgical History:  Procedure Laterality Date   CARPAL TUNNEL RELEASE Right 10/02/2012   Procedure: RIGHT CARPAL TUNNEL RELEASE ENDOSCOPIC;  Surgeon: Jolyn Nap, MD;  Location: [facility];  Service: Orthopedics;  Laterality: Right;   CHOLECYSTECTOMY N/A 12/02/2014   Procedure: LAPAROSCOPIC CHOLECYSTECTOMY;  Surgeon: Armandina Gemma, MD;  Location: [facility];  Service: General;  Laterality: N/A;   HYSTEROSCOPY WITH D & C  01-08-2002   FAMILY HISTORY Family History  Problem Relation Age of Onset   Other Mother    Diabetes Maternal Aunt    Heart attack Maternal Aunt    Alzheimer's disease Maternal Uncle    Cancer Maternal Uncle        prostate   Diabetes Paternal Aunt    Cancer Paternal Aunt        brain,mouth   Alzheimer's disease Paternal Aunt    Colon polyps Paternal Uncle    Diabetes Paternal Uncle    Diabetes Maternal Grandmother    Diabetes Maternal Grandfather    Leukemia Cousin    Other Other     Obesity Other    Other Other    Colitis Neg Hx    SOCIAL HISTORY Social History   Tobacco Use   Smoking status: Former    Years: 10.00    Types: Cigarettes    Quit date: 05/24/2010    Years since quitting: 11.1   Smokeless tobacco: Never  Vaping Use   Vaping Use: Never used  Substance Use Topics   Alcohol use: Yes    Alcohol/week: 0.0 standard drinks    Comment: socially   Drug use: No       OPHTHALMIC EXAM: Base Eye Exam     Visual Acuity (Snellen - Linear)       Right Left   Dist cc 20/30 -2 20/40   Dist ph cc 20/20 20/20 -2    Correction: Glasses         Tonometry (Tonopen, 3:12 PM)       Right Left   Pressure 16 13         Pupils       Dark Light Shape React APD   Right 3 2 Round Brisk None   Left 3 2 Round Brisk None         Visual Fields (Counting fingers)       Left Right    Full Full         Extraocular Movement       Right Left    Full, Ortho Full, Ortho         Neuro/Psych     Oriented x3: Yes   Mood/Affect: Normal         Dilation     Right eye: 1.0% Mydriacyl, 2.5% Phenylephrine @ 3:12 PM           Slit Lamp and Fundus Exam     Slit Lamp Exam       Right Left   Lids/Lashes Dermatochalasis - upper lid, mild Meibomian gland dysfunction Dermatochalasis - upper lid, mild Meibomian gland dysfunction   Conjunctiva/Sclera White  and quiet White and quiet   Cornea 1-2+Punctate epithelial erosions 2-3+fine Punctate epithelial erosions   Anterior Chamber deep and quiet deep and quiet   Iris round and dilated, No NVI round and dilated   Lens 1+ NS, 1+CC 1+NS, 1+CC   Anterior Vitreous mild vitreous syneresis mild vitreous syneresis, scattered vitreous condensations         Fundus Exam       Right Left   Disc Pink and sharp, +cupping Pink and sharp, +cupping   C/D Ratio 0.7 0.6   Macula good foveal reflex, focal exudates ST macula, scattered MA, focal cystic changes ST macula - persistent, focal laser changes  inferior macula good foveal reflex, scatttered MA's and exudate, central cystic changes -- persistent, focal cluster of exudates SN macula -  persistent, small cluster of MA central macula   Vessels attenuated, Tortuous Vascular attenuation, Tortuous, Copper wiring   Periphery Attached, scattered MA/exudates greatest nasally Attached, scattered MA and exudate greatest superior to disc, white without pressure temporally           Refraction     Wearing Rx       Sphere Cylinder Axis   Right -2.50 +1.50 013   Left -2.25 Sphere     Type: SVL           IMAGING AND PROCEDURES  Imaging and Procedures for _0 @  OCT, Retina - OU - Both Eyes       Right Eye Quality was good. Central Foveal Thickness: 234. Progression has been stable. Findings include no SRF, intraretinal fluid, intraretinal hyper-reflective material, vitreomacular adhesion , normal foveal contour (Persistent IRF/cystic changes temporal macula ).   Left Eye Quality was good. Central Foveal Thickness: 268. Progression has been stable. Findings include intraretinal fluid, no SRF, abnormal foveal contour, intraretinal hyper-reflective material (Persistent cystic changes SN macula and fovea, persistent prominent cyst nasal macula).   Notes *Images captured and stored on drive  Diagnosis / Impression:  +DME OU OD: Persistent IRF/cystic changes temporal macula  OS: persistent cystic changes SN macula and fovea, persistent prominent cyst nasal macula  Clinical management:  See below  Abbreviations: NFP - Normal foveal profile. CME - cystoid macular edema. PED - pigment epithelial detachment. IRF - intraretinal fluid. SRF - subretinal fluid. EZ - ellipsoid zone. ERM - epiretinal membrane. ORA - outer retinal atrophy. ORT - outer retinal tubulation. SRHM - subretinal hyper-reflective material      Intravitreal Injection, Pharmacologic Agent - OD - Right Eye       Time Out 07/14/2021. 3:30 PM. Confirmed correct  patient, procedure, site, and patient consented.   Anesthesia Topical anesthesia was used. Anesthetic medications included Lidocaine 2%, Proparacaine 0.5%.   Procedure Preparation included 5% betadine to ocular surface, eyelid speculum. A supplied needle was used.   Injection: 1.25 mg Bevacizumab 1.65m/0.05ml   Route: Intravitreal, Site: Right Eye   NDC:: [id number] Lot: [id number] , Expiration date: 09/02/2021   Post-op Post injection exam found visual acuity of at least counting fingers. The patient tolerated the procedure well. There were no complications. The patient received written and verbal post procedure care education. Post injection medications were not given.              ASSESSMENT/PLAN:    ICD-10-CM   1. Moderate nonproliferative diabetic retinopathy of both eyes with macular edema associated with type 2 diabetes mellitus (HCC)  E11.3313 OCT, Retina - OU - Both Eyes    Intravitreal Injection, Pharmacologic Agent - OD - Right  Eye    Bevacizumab (AVASTIN) SOLN 1.25 mg    2. Essential hypertension  I10     3. Hypertensive retinopathy of both eyes  H35.033     4. Nuclear sclerosis of both eyes  H25.13      1.  Moderate non-proliferative diabetic retinopathy w/ DME, OU  - pt lost to f/u from 10.23.2019 to 11.18.20  - FA in 2019 showed leaking MA OU; no NV OU  **of note, pt had pruritic rxn to fluorescein dye -- resolved with 12.5 mg IV benadryl**  - FA 12.16.2020 shows late leaking MA's, non-perfusion defects; no NV OU  - s/p IVA OD #1 (12.16.20), #2 (01.25.21), #3 (05.26.21), #4 (07.02.21), #5 (10.15.21), #6 (11.23.21), #7 (07.27.22), #8 (09.09.22), #9 (11.11.22), #10 (01.20.23), #11 (02.23.23), #12 (04.09.23)  - s/p focal laser OD (03.19.21), (04.01.22)  - s/p IVA OS #1 OS (12.23.20), #2 (01.27.21), #3 (03.02.21), #4 (05.26.21), #5 (07.02.21), #6 (10.15.21), #7 (11.23.21), #8 (01.11.22), #9 (03.04.22), #10 (04.27.22), #11 (11.14.22), #12 (01.10.23), #13  (02.20.23), #14 (04.05.23), #15 (05.19.23)  - exam shows scattered MA, IRH, exudates and mild macular edema OU  - BCVA 20/20 OU  - OCT shows OD: Persistent IRF/cystic changes temporal macula; OS: persistent cystic changes SN macula and fovea, persistent prominent cyst nasal macula at              - Recommend IVA OD #13 today, 05.23.23  - pt wishes to proceed with IVA OD #13 today, 05.23.23 - RBA of procedure discussed, questions answered - see procedure note  - Avastin informed consent form re-signed and scanned on 05.19.2023  - f/u 5 wks -- DFE, OCT, possible injection  2,3. Hypertensive retinopathy OU  - discussed importance of tight BP control  - monitor   4. Nuclear Sclerosis   - The symptoms of cataract, surgical options, and treatments and risks were discussed with patient.   - discussed diagnosis and progression  - under the expert management of [place] Ordered this visit:  Meds ordered this encounter  Medications   Bevacizumab (AVASTIN) SOLN 1.25 mg     Return in about 5 weeks (around 08/18/2021) for f/u NPDR OU, DFE, OCT.  There are no Patient Instructions on file for this visit.  Explained the diagnoses, plan, and follow up with the patient and they expressed understanding.  Patient expressed understanding of the importance of proper follow up care.  This document serves as a record of services personally performed by Gardiner Sleeper, MD, PhD. It was created on their behalf by San Jetty. Owens Shark, OA an ophthalmic technician. The creation of this record is the provider's dictation and/or activities during the visit.    Electronically signed by: San Jetty. Owens Shark, [place] 05.23.2023 8:37 AM  Gardiner Sleeper, M.D., Ph.D. Diseases & Surgery of the Retina and Vitreous [facility]  I have reviewed the above documentation for accuracy and completeness, and I agree with the above. Gardiner Sleeper, M.D., Ph.D. 07/15/21 8:38  AM  Abbreviations: M myopia (nearsighted); A astigmatism; H hyperopia (farsighted); P presbyopia; Mrx spectacle prescription;  CTL contact lenses; OD right eye; OS left eye; OU both eyes  XT exotropia; ET esotropia; PEK punctate epithelial keratitis; PEE punctate epithelial erosions; DES dry eye syndrome; MGD meibomian gland dysfunction; ATs artificial tears; PFAT's preservative free artificial tears; [place] nuclear sclerotic cataract; PSC posterior subcapsular cataract; ERM epi-retinal membrane; PVD posterior vitreous detachment; RD retinal detachment; DM diabetes mellitus; DR  diabetic retinopathy; NPDR non-proliferative diabetic retinopathy; PDR proliferative diabetic retinopathy; CSME clinically significant macular edema; DME diabetic macular edema; dbh dot blot hemorrhages; CWS cotton wool spot; POAG primary open angle glaucoma; C/D cup-to-disc ratio; HVF humphrey visual field; GVF goldmann visual field; OCT optical coherence tomography; IOP intraocular pressure; BRVO Branch retinal vein occlusion; CRVO central retinal vein occlusion; CRAO central retinal artery occlusion; BRAO branch retinal artery occlusion; RT retinal tear; SB scleral buckle; PPV pars plana vitrectomy; VH Vitreous hemorrhage; PRP panretinal laser photocoagulation; IVK intravitreal kenalog; VMT vitreomacular traction; MH Macular hole;  NVD neovascularization of the disc; NVE neovascularization elsewhere; AREDS age related eye disease study; ARMD age related macular degeneration; POAG primary open angle glaucoma; EBMD epithelial/anterior basement membrane dystrophy; ACIOL anterior chamber intraocular lens; IOL intraocular lens; PCIOL posterior chamber intraocular lens; Phaco/IOL phacoemulsification with intraocular lens placement; [place] photorefractive keratectomy; LASIK laser assisted in situ keratomileusis; HTN hypertension; DM diabetes mellitus; COPD chronic obstructive pulmonary disease

## 2021-08-11 NOTE — Progress Notes (Signed)
[facility] - Clinic Note  08/14/2021     CHIEF COMPLAINT Patient presents for Retina Follow Up  HISTORY OF PRESENT ILLNESS: Ana Long is a 46 y.o. female who presents to the clinic today for:   HPI     Retina Follow Up   Patient presents with  Diabetic Retinopathy.  In both eyes.  I, the attending physician,  performed the HPI with the patient and updated documentation appropriately.        Comments   NPDR OU- Was in the hospital for 4 days, June 3rd-7th for asthma and pneumonia in lungs. Sugar went to 394 while in the hospital.  238 this morning, on Prednisone for 30 days.  Vision does seem a little worse.        Last edited by Rennis Chris, MD on 08/17/2021 12:34 AM.    Pt was recently hospitalized for 4 days for asthma and pneumonia, she states she was given steroids which caused her sugar to climb to 394, she is on prednisone for 30 days now, she feels like her vision is worse due to the elevated blood sugar numbers  Referring physician: Olivia Canter MD [address] [place], [place] [address]  HISTORICAL INFORMATION:   Selected notes from the MEDICAL RECORD NUMBER Re-referred by Dr. Fabian Sharp for DM exam   CURRENT MEDICATIONS: Current Outpatient Medications (Ophthalmic Drugs)  Medication Sig   Olopatadine HCl 0.2 % SOLN Place one drop into both eyes daily.   No current facility-administered medications for this visit. (Ophthalmic Drugs)   Current Outpatient Medications (Other)  Medication Sig   acyclovir (ZOVIRAX) 400 MG tablet TAKE 1 TABLET BY MOUTH TWICE A DAY   albuterol (PROAIR HFA) 108 (90 Base) MCG/ACT inhaler Inhale 2 puffs into the lungs every 4 (four) hours as needed for wheezing or shortness of breath (cough). (Patient taking differently: Inhale 2 puffs into the lungs every 4 (four) hours as needed for wheezing or shortness of breath (or coughing).)   atorvastatin (LIPITOR) 10 MG tablet Take 10 mg by mouth daily.    benzonatate (TESSALON) 200 MG capsule Take 1 capsule (200 mg total) by mouth 3 (three) times daily as needed for cough.   CIPRODEX OTIC suspension Place into the right ear.   DERMA-SMOOTHE/FS SCALP 0.01 % OIL Apply topically.   fluconazole (DIFLUCAN) 150 MG tablet TAKE 1 TABLET BY MOUTH NOW. THEN REPEAT IN 1 WEEK   furosemide (LASIX) 20 MG tablet Take 20 mg by mouth daily as needed for fluid or edema.   HUMULIN R U-500 KWIKPEN 500 UNIT/ML kwikpen Inject into the skin.   hydrochlorothiazide (HYDRODIURIL) 12.5 MG tablet Take by mouth.   lactulose (CEPHULAC) 10 g packet Take 1 packet (10 g total) by mouth daily as needed. Reported on 07/29/2015 (Patient taking differently: Take 10 g by mouth daily as needed (for constipation).)   loratadine (CLARITIN) 10 MG tablet Take 1 tablet (10 mg total) by mouth 2 (two) times daily.   methocarbamol (ROBAXIN) 500 MG tablet Take 1 tablet (500 mg total) by mouth every 8 (eight) hours as needed.   mometasone-formoterol (DULERA) 200-5 MCG/ACT AERO Inhale 2 puffs into the lungs 2 (two) times daily.   montelukast (SINGULAIR) 10 MG tablet Take 1 tablet (10 mg total) by mouth at bedtime.   nitroGLYCERIN (NITRODUR - DOSED IN MG/24 HR) 0.2 mg/hr patch Apply 1/4th patch to affected achilles, change daily   omeprazole (PRILOSEC) 20 MG capsule Take 1 capsule (  20 mg total) by mouth daily before breakfast.   SPIRIVA RESPIMAT 1.25 MCG/ACT AERS SMARTSIG:2 Puff(s) Via Inhaler Daily   topiramate (TOPAMAX) 25 MG tablet Take by mouth.   Blood Glucose Monitoring Suppl (ACCU-CHEK AVIVA PLUS) w/Device KIT Please check you blood sugars 4 times a day   glucose blood (ACCU-CHEK SMARTVIEW) test strip TEST FOUR TIMES DAILY   Insulin Pen Needle (B-D UF III MINI PEN NEEDLES) 31G X 5 MM MISC USE AS DIRECTED TO INJECT INSULIN FIVE A DAY E11.65   Lancets (ACCU-CHEK SOFT TOUCH) lancets Use as instructed   Current Facility-Administered Medications (Other)  Medication Route   diphenhydrAMINE  (BENADRYL) injection 12.5 mg Intramuscular   REVIEW OF SYSTEMS: ROS   Positive for: Gastrointestinal, Endocrine, Eyes, Respiratory Negative for: Constitutional, Neurological, Skin, Genitourinary, Musculoskeletal, HENT, Cardiovascular, Psychiatric, Allergic/Imm, Heme/Lymph Last edited by Joni Reining, COA on 08/14/2021  2:43 PM.      ALLERGIES Allergies  Allergen Reactions   Bee Venom Anaphylaxis and Swelling   Wasp Venom Anaphylaxis and Swelling   Citrus Hives and Itching   Latex Hives and Swelling   Penicillins Hives and Itching    Has patient had a PCN reaction causing immediate rash, facial/tongue/throat swelling, SOB or lightheadedness with hypotension: Yes Has patient had a PCN reaction causing severe rash involving mucus membranes or skin necrosis: Unk Has patient had a PCN reaction that required hospitalization: Was already in hosp Has patient had a PCN reaction occurring within the last 10 years: No If all of the above answers are "NO", then may proceed with Cephalosporin use.    Shellfish Allergy Other (See Comments) and Swelling    Patient is uncertain; stated that she tolerates this (??)   Soap Hives, Itching and Swelling    PUREX LAUNDRY DETERGENT   Tomato Hives and Itching   Banana Itching and Nausea Only   Chocolate Itching   Fluticasone Other (See Comments)    Per patient, makes sneeze and gag   Hydrocodone Itching and Nausea Only   Other Itching    Acidic foods- Itching [place]- Itching in the roof of the mouth   Adhesive [Tape] Itching and Rash    Allergic to  "leads"   Doxycycline Other (See Comments)    AFFECTS JOINTS   Liraglutide Nausea Only   Metformin And Related Nausea Only, Rash, Other (See Comments) and Diarrhea    GI UPSET, also   Red Dye Itching, Rash and Other (See Comments)    At eye doctor's office, the dilating drops- Skin breaks out, chest turned red, itching, face became swollen   PAST MEDICAL HISTORY Past Medical History:  Diagnosis  Date   Acute nonintractable headache 12/26/2017   Arthritis    major joints   Asthma    prn inhaler   Asthma exacerbation 07/25/2013   Asthma in adult, severe persistent, with acute exacerbation 01/11/2018   Carpal tunnel syndrome of right wrist 09/2012   Cataract    NS OU   Complication of anesthesia    states is hard to wake up post-op   Constipation 12/30/2014   Diabetes mellitus    intolerant to Metformin   Diabetic retinopathy (HCC)    NPDR OU   Dry skin    hands    Family history of anesthesia complication    mother went into coma during c-section   Genital herpes 01/01/2006   on acyclovir BID    GERD (gastroesophageal reflux disease)    Herpes genital    Hyperglycemia 12/26/2017  Hypertensive retinopathy    OU   Low back pain 11/22/2012   Lower extremity edema 10/18/2012   Menorrhagia 11/06/2015   Morbid obesity with BMI of 50 to 60 07/23/2009   Obesity hypoventilation syndrome (HCC) 10/30/2012   Obesity, morbid (HCC) 01/10/2014   OSA on CPAP     AHI was on  11-17-12 to 120/hr, titrated to 15 cm with 3 cm EPR/    PCOS (polycystic ovarian syndrome)    Rheumatoid arthritis (HCC)    Right ankle pain 06/19/2015   Right hip pain 09/04/2017   Right shoulder pain 06/01/2017   Seasonal allergies    Sleep apnea, obstructive    uses CPAP nightly - had sleep study 08/22/2007   Past Surgical History:  Procedure Laterality Date   CARPAL TUNNEL RELEASE Right 10/02/2012   Procedure: RIGHT CARPAL TUNNEL RELEASE ENDOSCOPIC;  Surgeon: Jodi Marble, MD;  Location: [facility];  Service: Orthopedics;  Laterality: Right;   CHOLECYSTECTOMY N/A 12/02/2014   Procedure: LAPAROSCOPIC CHOLECYSTECTOMY;  Surgeon: Darnell Level, MD;  Location: [facility];  Service: General;  Laterality: N/A;   HYSTEROSCOPY WITH D & C  01-08-2002   FAMILY HISTORY Family History  Problem Relation Age of Onset   Other Mother    Diabetes Maternal Aunt    Heart attack Maternal Aunt    Alzheimer's disease  Maternal Uncle    Cancer Maternal Uncle        prostate   Diabetes Paternal Aunt    Cancer Paternal Aunt        brain,mouth   Alzheimer's disease Paternal Aunt    Colon polyps Paternal Uncle    Diabetes Paternal Uncle    Diabetes Maternal Grandmother    Diabetes Maternal Grandfather    Leukemia Cousin    Other Other    Obesity Other    Other Other    Colitis Neg Hx    SOCIAL HISTORY Social History   Tobacco Use   Smoking status: Former    Years: 10.00    Types: Cigarettes    Quit date: 05/24/2010    Years since quitting: 11.2   Smokeless tobacco: Never  Vaping Use   Vaping Use: Never used  Substance Use Topics   Alcohol use: Yes    Alcohol/week: 0.0 standard drinks of alcohol    Comment: socially   Drug use: No       OPHTHALMIC EXAM: Base Eye Exam     Visual Acuity (Snellen - Linear)       Right Left   Dist cc 20/30 -2 20/30 +1   Dist ph cc 20/25 +2 20/25    Correction: Glasses         Tonometry (Tonopen, 2:51 PM)       Right Left   Pressure 17 18         Pupils       Dark Light Shape React APD   Right 3 2 Round Brisk None   Left 3 2 Round Brisk None         Visual Fields (Counting fingers)       Left Right    Full Full         Extraocular Movement       Right Left    Full Full         Neuro/Psych     Oriented x3: Yes   Mood/Affect: Normal         Dilation     Both eyes:  1.0% Mydriacyl, 2.5% Phenylephrine @ 2:51 PM           Slit Lamp and Fundus Exam     Slit Lamp Exam       Right Left   Lids/Lashes Dermatochalasis - upper lid, mild Meibomian gland dysfunction Dermatochalasis - upper lid, mild Meibomian gland dysfunction   Conjunctiva/Sclera White and quiet White and quiet   Cornea 1-2+Punctate epithelial erosions 2-3+fine Punctate epithelial erosions   Anterior Chamber deep and quiet deep and quiet   Iris round and dilated, No NVI round and dilated   Lens 1+ NS, 1+CC 1+NS, 1+CC   Anterior Vitreous mild  vitreous syneresis mild vitreous syneresis, scattered vitreous condensations         Fundus Exam       Right Left   Disc Pink and sharp, +cupping Pink and sharp, +cupping   C/D Ratio 0.7 0.6   Macula good foveal reflex, focal exudates ST macula, scattered MA, focal cystic changes ST macula - slightly increased, focal laser changes inferior macula good foveal reflex, scatttered MA's and exudate, central cystic changes -- slightly increased, focal cluster of exudates SN macula -  persistent, small cluster of MA central macula   Vessels attenuated, Tortuous Vascular attenuation, Tortuous, Copper wiring   Periphery Attached, scattered MA/exudates greatest nasally Attached, scattered MA and exudate greatest superior to disc, white without pressure temporally           Refraction     Wearing Rx       Sphere Cylinder Axis   Right -2.50 +1.50 013   Left -2.25 Sphere     Type: SVL           IMAGING AND PROCEDURES  Imaging and Procedures for @TODAY @  OCT, Retina - OU - Both Eyes       Right Eye Quality was good. Central Foveal Thickness: 231. Progression has worsened. Findings include normal foveal contour, no SRF, intraretinal hyper-reflective material, intraretinal fluid, vitreomacular adhesion (Mild interval increase in IRF/IRHM changes ST macula ).   Left Eye Quality was good. Central Foveal Thickness: 269. Progression has worsened. Findings include no SRF, abnormal foveal contour, intraretinal hyper-reflective material, intraretinal fluid (Mild interval increase in central cystic changes SN macula and fovea, persistent prominent cyst nasal macula).   Notes *Images captured and stored on drive  Diagnosis / Impression:  +DME OU OD:  Mild interval increase in IRF/IRHM changes ST macula  OS: Mild interval increase in central cystic changes SN macula and fovea, persistent prominent cyst nasal macula  Clinical management:  See below  Abbreviations: NFP - Normal foveal  profile. CME - cystoid macular edema. PED - pigment epithelial detachment. IRF - intraretinal fluid. SRF - subretinal fluid. EZ - ellipsoid zone. ERM - epiretinal membrane. ORA - outer retinal atrophy. ORT - outer retinal tubulation. SRHM - subretinal hyper-reflective material      Intravitreal Injection, Pharmacologic Agent - OS - Left Eye       Time Out 08/14/2021. 3:06 PM. Confirmed correct patient, procedure, site, and patient consented.   Anesthesia Topical anesthesia was used. Anesthetic medications included Lidocaine 2%, Proparacaine 0.5%.   Procedure Preparation included 5% betadine to ocular surface, eyelid speculum. A supplied (32g) needle was used.   Injection: 1.25 mg Bevacizumab 1.25mg /0.48ml   Route: Intravitreal, Site: Left Eye   NDC: [id number], Lot: [id number], Expiration date: 10/19/2021   Post-op Post injection exam found visual acuity of at least counting fingers. The patient tolerated the procedure well. There were no  complications. The patient received written and verbal post procedure care education. Post injection medications were not given.               ASSESSMENT/PLAN:    ICD-10-CM   1. Moderate nonproliferative diabetic retinopathy of both eyes with macular edema associated with type 2 diabetes mellitus (HCC)  E11.3313 OCT, Retina - OU - Both Eyes    Intravitreal Injection, Pharmacologic Agent - OS - Left Eye    Bevacizumab (AVASTIN) SOLN 1.25 mg    2. Essential hypertension  I10     3. Hypertensive retinopathy of both eyes  H35.033     4. Nuclear sclerosis of both eyes  H25.13       1.  Moderate non-proliferative diabetic retinopathy w/ DME, OU  - pt lost to f/u from 10.23.2019 to 11.18.20  - FA in 2019 showed leaking MA OU; no NV OU  **of note, pt had pruritic rxn to fluorescein dye -- resolved with 12.5 mg IV benadryl**  - FA 12.16.2020 shows late leaking MA's, non-perfusion defects; no NV OU  - s/p IVA OD #1 (12.16.20), #2 (01.25.21),  #3 (05.26.21), #4 (07.02.21), #5 (10.15.21), #6 (11.23.21), #7 (07.27.22), #8 (09.09.22), #9 (11.11.22), #10 (01.20.23), #11 (02.23.23), #12 (04.09.23), #13 (05.23.23)  - s/p focal laser OD (03.19.21), (04.01.22)  - s/p IVA OS #1 OS (12.23.20), #2 (01.27.21), #3 (03.02.21), #4 (05.26.21), #5 (07.02.21), #6 (10.15.21), #7 (11.23.21), #8 (01.11.22), #9 (03.04.22), #10 (04.27.22), #11 (11.14.22), #12 (01.10.23), #13 (02.20.23), #14 (04.05.23), #15 (05.19.23)  - exam shows scattered MA, IRH, exudates and mild macular edema OU  - BCVA 20/20 OU  - OCT shows OD:  Mild interval increase in IRF/IRHM changes ST macula; OS: Mild interval increase in central cystic changes SN macula and fovea, persistent prominent cyst nasal macula at 5 wks             - Recommend IVA OS #16 today, 06.23.23  - pt wishes to proceed with injection  - RBA of procedure discussed, questions answered - see procedure note  - Avastin informed consent form re-signed and scanned on 05.19.2023  - f/u 1 wks -- DFE, OCT, possible injection (IVA OD)  2,3. Hypertensive retinopathy OU  - discussed importance of tight BP control  - monitor   4. Nuclear Sclerosis   - The symptoms of cataract, surgical options, and treatments and risks were discussed with patient.   - discussed diagnosis and progression  - under the expert management of [facility]   Ophthalmic Meds Ordered this visit:  Meds ordered this encounter  Medications   Bevacizumab (AVASTIN) SOLN 1.25 mg     Return in about 1 week (around 08/21/2021) for f/u NPDR OU, DFE, OCT.  There are no Patient Instructions on file for this visit.  Explained the diagnoses, plan, and follow up with the patient and they expressed understanding.  Patient expressed understanding of the importance of proper follow up care.  This document serves as a record of services personally performed by Karie Chimera, MD, PhD. It was created on their behalf by Joni Reining, an ophthalmic  technician. The creation of this record is the provider's dictation and/or activities during the visit.    Electronically signed by: Joni Reining COA, 08/17/21  12:41 AM  Karie Chimera, M.D., Ph.D. Diseases & Surgery of the Retina and Vitreous [facility]  I have reviewed the above documentation for accuracy and completeness, and I agree with the above. Arlys John  Wynona Neat, M.D., Ph.D. 08/17/21 12:42 AM  Abbreviations: M myopia (nearsighted); A astigmatism; H hyperopia (farsighted); P presbyopia; Mrx spectacle prescription;  CTL contact lenses; OD right eye; OS left eye; OU both eyes  XT exotropia; ET esotropia; PEK punctate epithelial keratitis; PEE punctate epithelial erosions; DES dry eye syndrome; MGD meibomian gland dysfunction; ATs artificial tears; PFAT's preservative free artificial tears; NSC nuclear sclerotic cataract; PSC posterior subcapsular cataract; ERM epi-retinal membrane; PVD posterior vitreous detachment; RD retinal detachment; DM diabetes mellitus; DR diabetic retinopathy; NPDR non-proliferative diabetic retinopathy; PDR proliferative diabetic retinopathy; CSME clinically significant macular edema; DME diabetic macular edema; dbh dot blot hemorrhages; CWS cotton wool spot; POAG primary open angle glaucoma; C/D cup-to-disc ratio; HVF humphrey visual field; GVF goldmann visual field; OCT optical coherence tomography; IOP intraocular pressure; BRVO Branch retinal vein occlusion; CRVO central retinal vein occlusion; CRAO central retinal artery occlusion; BRAO branch retinal artery occlusion; RT retinal tear; SB scleral buckle; PPV pars plana vitrectomy; VH Vitreous hemorrhage; PRP panretinal laser photocoagulation; IVK intravitreal kenalog; VMT vitreomacular traction; MH Macular hole;  NVD neovascularization of the disc; NVE neovascularization elsewhere; AREDS age related eye disease study; ARMD age related macular degeneration; POAG primary open angle glaucoma; EBMD  epithelial/anterior basement membrane dystrophy; ACIOL anterior chamber intraocular lens; IOL intraocular lens; PCIOL posterior chamber intraocular lens; Phaco/IOL phacoemulsification with intraocular lens placement; PRK photorefractive keratectomy; LASIK laser assisted in situ keratomileusis; HTN hypertension; DM diabetes mellitus; COPD chronic obstructive pulmonary disease

## 2021-08-14 ENCOUNTER — Ambulatory Visit (INDEPENDENT_AMBULATORY_CARE_PROVIDER_SITE_OTHER): Payer: Medicaid Other | Admitting: Ophthalmology

## 2021-08-14 ENCOUNTER — Encounter (INDEPENDENT_AMBULATORY_CARE_PROVIDER_SITE_OTHER): Payer: Self-pay | Admitting: Ophthalmology

## 2021-08-14 ENCOUNTER — Encounter (INDEPENDENT_AMBULATORY_CARE_PROVIDER_SITE_OTHER): Payer: Medicaid Other | Admitting: Ophthalmology

## 2021-08-14 DIAGNOSIS — H35033 Hypertensive retinopathy, bilateral: Secondary | ICD-10-CM

## 2021-08-14 DIAGNOSIS — H2513 Age-related nuclear cataract, bilateral: Secondary | ICD-10-CM | POA: Diagnosis not present

## 2021-08-14 DIAGNOSIS — E113313 Type 2 diabetes mellitus with moderate nonproliferative diabetic retinopathy with macular edema, bilateral: Secondary | ICD-10-CM

## 2021-08-14 DIAGNOSIS — I10 Essential (primary) hypertension: Secondary | ICD-10-CM | POA: Diagnosis not present

## 2021-08-17 ENCOUNTER — Encounter (INDEPENDENT_AMBULATORY_CARE_PROVIDER_SITE_OTHER): Payer: Self-pay | Admitting: Ophthalmology

## 2021-08-17 MED ORDER — BEVACIZUMAB CHEMO INJECTION 1.25MG/0.05ML SYRINGE FOR KALEIDOSCOPE
1.2500 mg | INTRAVITREAL | Status: AC | PRN
  Administered 2021-08-17: 1.25 mg via INTRAVITREAL

## 2021-08-19 ENCOUNTER — Encounter (INDEPENDENT_AMBULATORY_CARE_PROVIDER_SITE_OTHER): Payer: Medicaid Other | Admitting: Ophthalmology

## 2021-08-21 ENCOUNTER — Ambulatory Visit (INDEPENDENT_AMBULATORY_CARE_PROVIDER_SITE_OTHER): Payer: Medicaid Other | Admitting: Ophthalmology

## 2021-08-21 ENCOUNTER — Encounter (INDEPENDENT_AMBULATORY_CARE_PROVIDER_SITE_OTHER): Payer: Self-pay | Admitting: Ophthalmology

## 2021-08-21 DIAGNOSIS — E113313 Type 2 diabetes mellitus with moderate nonproliferative diabetic retinopathy with macular edema, bilateral: Secondary | ICD-10-CM

## 2021-08-21 DIAGNOSIS — I10 Essential (primary) hypertension: Secondary | ICD-10-CM

## 2021-08-21 DIAGNOSIS — H35033 Hypertensive retinopathy, bilateral: Secondary | ICD-10-CM | POA: Diagnosis not present

## 2021-08-21 DIAGNOSIS — H2513 Age-related nuclear cataract, bilateral: Secondary | ICD-10-CM

## 2021-08-21 NOTE — Progress Notes (Signed)
[facility] Note  08/21/2021     CHIEF COMPLAINT Patient presents for Retina Follow Up  HISTORY OF PRESENT ILLNESS: Ana Long is a 46 y.o. female who presents to the clinic today for:   HPI     Retina Follow Up   Patient presents with  Diabetic Retinopathy.  In both eyes.  This started 1 week ago.  I, the attending physician,  performed the HPI with the patient and updated documentation appropriately.        Comments   Patient here for 1 week retina follow up for NPDR OU. Patient states vision is ok. Did have eye pain after injection. Has the scars after shot. They seem to be gone now. Still using prednisone 5 mg.      Last edited by Bernarda Caffey, MD on 08/23/2021  2:45 AM.    Pt reports her breathing is a little better. Still on prednisone and BG still elevated.  Referring physician: [name] MD [place], [place] [address]  HISTORICAL INFORMATION:   Selected notes from the [facility]-referred by Dr. [name] for DM exam   CURRENT MEDICATIONS: Current Outpatient Medications (Ophthalmic Drugs)  Medication Sig   Olopatadine HCl 0.2 % SOLN Place one drop into both eyes daily.   No current facility-administered medications for this visit. (Ophthalmic Drugs)   Current Outpatient Medications (Other)  Medication Sig   acyclovir (ZOVIRAX) 400 MG tablet TAKE 1 TABLET BY MOUTH TWICE A DAY   albuterol (PROAIR HFA) 108 (90 Base) MCG/ACT inhaler Inhale 2 puffs into the lungs every 4 (four) hours as needed for wheezing or shortness of breath (cough). (Patient taking differently: Inhale 2 puffs into the lungs every 4 (four) hours as needed for wheezing or shortness of breath (or coughing).)   atorvastatin (LIPITOR) 10 MG tablet Take 10 mg by mouth daily.   benzonatate (TESSALON) 200 MG capsule Take 1 capsule (200 mg total) by mouth 3 (three) times daily as needed for cough.   Blood Glucose Monitoring Suppl  (ACCU-CHEK AVIVA PLUS) w/Device KIT Please check you blood sugars 4 times a day   CIPRODEX OTIC suspension Place into the right ear.   DERMA-SMOOTHE/FS SCALP 0.01 % OIL Apply topically.   fluconazole (DIFLUCAN) 150 MG tablet TAKE 1 TABLET BY MOUTH NOW. THEN REPEAT IN 1 WEEK   furosemide (LASIX) 20 MG tablet Take 20 mg by mouth daily as needed for fluid or edema.   glucose blood (ACCU-CHEK SMARTVIEW) test strip TEST FOUR TIMES DAILY   HUMULIN R U-500 KWIKPEN 500 UNIT/ML kwikpen Inject into the skin.   hydrochlorothiazide (HYDRODIURIL) 12.5 MG tablet Take by mouth.   Insulin Pen Needle (B-D UF III MINI PEN NEEDLES) 31G X 5 MM MISC USE AS DIRECTED TO INJECT INSULIN FIVE A DAY E11.65   lactulose (CEPHULAC) 10 g packet Take 1 packet (10 g total) by mouth daily as needed. Reported on 07/29/2015 (Patient taking differently: Take 10 g by mouth daily as needed (for constipation).)   Lancets (ACCU-CHEK SOFT TOUCH) lancets Use as instructed   loratadine (CLARITIN) 10 MG tablet Take 1 tablet (10 mg total) by mouth 2 (two) times daily.   methocarbamol (ROBAXIN) 500 MG tablet Take 1 tablet (500 mg total) by mouth every 8 (eight) hours as needed.   mometasone-formoterol (DULERA) 200-5 MCG/ACT AERO Inhale 2 puffs into the lungs 2 (two) times daily.   montelukast (SINGULAIR) 10 MG tablet Take 1  tablet (10 mg total) by mouth at bedtime.   nitroGLYCERIN (NITRODUR - DOSED IN MG/24 HR) 0.2 mg/hr patch Apply 1/4th patch to affected achilles, change daily   omeprazole (PRILOSEC) 20 MG capsule Take 1 capsule (20 mg total) by mouth daily before breakfast.   SPIRIVA RESPIMAT 1.25 MCG/ACT AERS SMARTSIG:2 Puff(s) Via Inhaler Daily   topiramate (TOPAMAX) 25 MG tablet Take by mouth.   Current Facility-Administered Medications (Other)  Medication Route   diphenhydrAMINE (BENADRYL) injection 12.5 mg Intramuscular   REVIEW OF SYSTEMS: ROS   Positive for: Gastrointestinal, Endocrine, Eyes, Respiratory Negative for:  Constitutional, Neurological, Skin, Genitourinary, Musculoskeletal, HENT, Cardiovascular, Psychiatric, Allergic/Imm, Heme/Lymph Last edited by Theodore Demark, COA on 08/21/2021 10:00 AM.      ALLERGIES Allergies  Allergen Reactions   Bee Venom Anaphylaxis and Swelling   Wasp Venom Anaphylaxis and Swelling   Citrus Hives and Itching   Latex Hives and Swelling   Penicillins Hives and Itching    Has patient had a PCN reaction causing immediate rash, facial/tongue/throat swelling, SOB or lightheadedness with hypotension: Yes Has patient had a PCN reaction causing severe rash involving mucus membranes or skin necrosis: Unk Has patient had a PCN reaction that required hospitalization: Was already in hosp Has patient had a PCN reaction occurring within the last 10 years: No If all of the above answers are "NO", then may proceed with Cephalosporin use.    Shellfish Allergy Other (See Comments) and Swelling    Patient is uncertain; stated that she tolerates this (??)   Soap Hives, Itching and Swelling    PUREX LAUNDRY DETERGENT   Tomato Hives and Itching   Banana Itching and Nausea Only   Chocolate Itching   Fluticasone Other (See Comments)    Per patient, makes sneeze and gag   Hydrocodone Itching and Nausea Only   Other Itching    Acidic foods- Itching [place]- Itching in the roof of the mouth   Adhesive [Tape] Itching and Rash    Allergic to  "leads"   Doxycycline Other (See Comments)    AFFECTS JOINTS   Liraglutide Nausea Only   Metformin And Related Nausea Only, Rash, Other (See Comments) and Diarrhea    GI UPSET, also   Red Dye Itching, Rash and Other (See Comments)    At eye doctor's office, the dilating drops- Skin breaks out, chest turned red, itching, face became swollen   PAST MEDICAL HISTORY Past Medical History:  Diagnosis Date   Acute nonintractable headache 12/26/2017   Arthritis    major joints   Asthma    prn inhaler   Asthma exacerbation 07/25/2013   Asthma  in adult, severe persistent, with acute exacerbation 01/11/2018   Carpal tunnel syndrome of right wrist 09/2012   Cataract    NS OU   Complication of anesthesia    states is hard to wake up post-op   Constipation 12/30/2014   Diabetes mellitus    intolerant to Metformin   Diabetic retinopathy (HCC)    NPDR OU   Dry skin    hands    Family history of anesthesia complication    mother went into coma during c-section   Genital herpes 01/01/2006   on acyclovir BID    GERD (gastroesophageal reflux disease)    Herpes genital    Hyperglycemia 12/26/2017   Hypertensive retinopathy    OU   Low back pain 11/22/2012   Lower extremity edema 10/18/2012   Menorrhagia 11/06/2015   Morbid obesity with BMI  of 50 to 60 07/23/2009   Obesity hypoventilation syndrome ([place]) 10/30/2012   Obesity, morbid ([place]) 01/10/2014   OSA on CPAP     AHI was on  11-17-12 to 120/hr, titrated to 15 cm with 3 cm EPR/    PCOS (polycystic ovarian syndrome)    Rheumatoid arthritis ([place])    Right ankle pain 06/19/2015   Right hip pain 09/04/2017   Right shoulder pain 06/01/2017   Seasonal allergies    Sleep apnea, obstructive    uses CPAP nightly - had sleep study 08/22/2007   Past Surgical History:  Procedure Laterality Date   CARPAL TUNNEL RELEASE Right 10/02/2012   Procedure: RIGHT CARPAL TUNNEL RELEASE ENDOSCOPIC;  Surgeon: Jolyn Nap, MD;  Location: [facility];  Service: Orthopedics;  Laterality: Right;   CHOLECYSTECTOMY N/A 12/02/2014   Procedure: LAPAROSCOPIC CHOLECYSTECTOMY;  Surgeon: Armandina Gemma, MD;  Location: [facility];  Service: General;  Laterality: N/A;   HYSTEROSCOPY WITH D & C  01-08-2002   FAMILY HISTORY Family History  Problem Relation Age of Onset   Other Mother    Diabetes Maternal Aunt    Heart attack Maternal Aunt    Alzheimer's disease Maternal Uncle    Cancer Maternal Uncle        prostate   Diabetes Paternal Aunt    Cancer Paternal Aunt        brain,mouth   Alzheimer's  disease Paternal Aunt    Colon polyps Paternal Uncle    Diabetes Paternal Uncle    Diabetes Maternal Grandmother    Diabetes Maternal Grandfather    Leukemia Cousin    Other Other    Obesity Other    Other Other    Colitis Neg Hx    SOCIAL HISTORY Social History   Tobacco Use   Smoking status: Former    Years: 10.00    Types: Cigarettes    Quit date: 05/24/2010    Years since quitting: 11.2   Smokeless tobacco: Never  Vaping Use   Vaping Use: Never used  Substance Use Topics   Alcohol use: Yes    Alcohol/week: 0.0 standard drinks of alcohol    Comment: socially   Drug use: No       OPHTHALMIC EXAM: Base Eye Exam     Visual Acuity (Snellen - Linear)       Right Left   Dist cc 20/20 -1 20/40   Dist ph cc  20/25 +2    Correction: Glasses         Tonometry (Tonopen, 9:57 AM)       Right Left   Pressure 16 16         Pupils       Dark Light Shape React APD   Right 3 2 Round Brisk None   Left 3 2 Round Brisk None         Visual Fields (Counting fingers)       Left Right    Full Full         Extraocular Movement       Right Left    Full, Ortho Full, Ortho         Neuro/Psych     Oriented x3: Yes   Mood/Affect: Normal         Dilation     Right eye: 1.0% Mydriacyl, 2.5% Phenylephrine @ 9:57 AM           Slit Lamp and Fundus Exam     Slit  Lamp Exam       Right Left   Lids/Lashes Dermatochalasis - upper lid, mild Meibomian gland dysfunction Dermatochalasis - upper lid, mild Meibomian gland dysfunction   Conjunctiva/Sclera White and quiet White and quiet   Cornea 1-2+Punctate epithelial erosions 2-3+fine Punctate epithelial erosions   Anterior Chamber deep and quiet deep and quiet   Iris round and dilated, No NVI round and dilated   Lens 1+ NS, 1+CC 1+NS, 1+CC   Anterior Vitreous mild vitreous syneresis mild vitreous syneresis, scattered vitreous condensations         Fundus Exam       Right Left   Disc Pink and  sharp, +cupping Pink and sharp, +cupping   C/D Ratio 0.7 0.6   Macula good foveal reflex, focal exudates ST macula, scattered MA, focal cystic changes ST macula - slightly increased, focal laser changes inferior macula good foveal reflex, scatttered MA's and exudate, central cystic changes -- slightly increased, focal cluster of exudates SN macula -  persistent, small cluster of MA central macula   Vessels attenuated, Tortuous Vascular attenuation, Tortuous, Copper wiring   Periphery Attached, scattered MA/exudates greatest nasally Attached, scattered MA and exudate greatest superior to disc, white without pressure temporally           Refraction     Wearing Rx       Sphere Cylinder Axis   Right -2.50 +1.50 013   Left -2.25 Sphere     Type: SVL           IMAGING AND PROCEDURES  Imaging and Procedures for @TODAY @  Intravitreal Injection, Pharmacologic Agent - OD - Right Eye       Time Out 08/21/2021. 10:43 AM. Confirmed correct patient, procedure, site, and patient consented.   Anesthesia Topical anesthesia was used. Anesthetic medications included Lidocaine 2%, Proparacaine 0.5%.   Procedure Preparation included 5% betadine to ocular surface, eyelid speculum. A supplied (32g) needle was used.   Injection: 1.25 mg Bevacizumab 1.70m/0.05ml   Route: Intravitreal, Site: Right Eye   NDC: 50242-060-01, Lot:: [id number] Expiration date: 10/19/2021   Post-op Post injection exam found visual acuity of at least counting fingers. The patient tolerated the procedure well. There were no complications. The patient received written and verbal post procedure care education. Post injection medications were not given.      OCT, Retina - OU - Both Eyes       Right Eye Quality was good. Central Foveal Thickness: 224. Progression has been stable. Findings include normal foveal contour, no SRF, intraretinal hyper-reflective material, intraretinal fluid, vitreomacular adhesion (Persistent  IRF/IRHM ST macula ).   Left Eye Quality was good. Central Foveal Thickness: 263. Progression has improved. Findings include no SRF, abnormal foveal contour, intraretinal hyper-reflective material, intraretinal fluid (Mild interval improvement in IRF, persistent prominent cyst nasal macula).   Notes *Images captured and stored on drive  Diagnosis / Impression:  +DME OU OD: Persistent  IRF/IRHM ST macula  OS: Mild interval improvement in IRF, persistent prominent cyst nasal macula  Clinical management:  See below  Abbreviations: NFP - Normal foveal profile. CME - cystoid macular edema. PED - pigment epithelial detachment. IRF - intraretinal fluid. SRF - subretinal fluid. EZ - ellipsoid zone. ERM - epiretinal membrane. ORA - outer retinal atrophy. ORT - outer retinal tubulation. SRHM - subretinal hyper-reflective material            ASSESSMENT/PLAN:    ICD-10-CM   1. Moderate nonproliferative diabetic retinopathy of both eyes with macular edema  associated with type 2 diabetes mellitus (HCC)  Z99.3570 Intravitreal Injection, Pharmacologic Agent - OD - Right Eye    OCT, Retina - OU - Both Eyes    Bevacizumab (AVASTIN) SOLN 1.25 mg    2. Essential hypertension  I10     3. Hypertensive retinopathy of both eyes  H35.033     4. Nuclear sclerosis of both eyes  H25.13      1.  Moderate non-proliferative diabetic retinopathy w/ DME, OU  - pt lost to f/u from 10.23.2019 to 11.18.20  - FA in 2019 showed leaking MA OU; no NV OU  **of note, pt had pruritic rxn to fluorescein dye -- resolved with 12.5 mg IV benadryl**  - FA 12.16.2020 shows late leaking MA's, non-perfusion defects; no NV OU  - s/p IVA OD #1 (12.16.20), #2 (01.25.21), #3 (05.26.21), #4 (07.02.21), #5 (10.15.21), #6 (11.23.21), #7 (07.27.22), #8 (09.09.22), #9 (11.11.22), #10 (01.20.23), #11 (02.23.23), #12 (04.09.23), #13 (05.23.23)  - s/p focal laser OD (03.19.21), (04.01.22)  - s/p IVA OS #1 OS (12.23.20), #2  (01.27.21), #3 (03.02.21), #4 (05.26.21), #5 (07.02.21), #6 (10.15.21), #7 (11.23.21), #8 (01.11.22), #9 (03.04.22), #10 (04.27.22), #11 (11.14.22), #12 (01.10.23), #13 (02.20.23), #14 (04.05.23), #15 (05.19.23), #16 (06.23.23)  - exam shows scattered MA, IRH, exudates and mild macular edema OU  - BCVA 20/20 OD, 20/25 OS  - OCT shows OD: Persistent  IRF/IRHM ST macula; OS: Mild interval improvement in IRF, persistent prominent cyst nasal macula             - Recommend IVA OD #14 today, 06.30.23  - pt wishes to proceed with injection  - RBA of procedure discussed, questions answered - see procedure note  - Avastin informed consent form re-signed and scanned on 05.19.2023  - f/u 4 wks -- DFE, OCT, possible injection (IVA OS)  2,3. Hypertensive retinopathy OU  - discussed importance of tight BP control  - monitor   4. Nuclear Sclerosis   - The symptoms of cataract, surgical options, and treatments and risks were discussed with patient.   - discussed diagnosis and progression  - under the expert management of [facility] Ordered this visit:  Meds ordered this encounter  Medications   Bevacizumab (AVASTIN) SOLN 1.25 mg     Return in about 4 weeks (around 09/18/2021) for f/u NPDR OU, DFE, OCT.  There are no Patient Instructions on file for this visit.  Explained the diagnoses, plan, and follow up with the patient and they expressed understanding.  Patient expressed understanding of the importance of proper follow up care.  This document serves as a record of services personally performed by Gardiner Sleeper, MD, PhD. It was created on their behalf by Leonie Douglas, an ophthalmic technician. The creation of this record is the provider's dictation and/or activities during the visit.    Electronically signed by: Leonie Douglas COA, 08/23/21  2:50 AM  This document serves as a record of services personally performed by Gardiner Sleeper, MD, PhD. It was created on their behalf  by San Jetty. Owens Shark, OA an ophthalmic technician. The creation of this record is the provider's dictation and/or activities during the visit.    Electronically signed by: San Jetty. Owens Shark, [place] 06.30.2023 2:50 AM  Gardiner Sleeper, M.D., Ph.D. Diseases & Surgery of the Retina and Vitreous [facility]  I have reviewed the above documentation for accuracy and completeness, and I agree with the above. Sharyon Cable  Coralyn Pear, M.D., Ph.D. 08/23/21 2:52 AM  Abbreviations: M myopia (nearsighted); A astigmatism; H hyperopia (farsighted); P presbyopia; Mrx spectacle prescription;  CTL contact lenses; OD right eye; OS left eye; OU both eyes  XT exotropia; ET esotropia; PEK punctate epithelial keratitis; PEE punctate epithelial erosions; DES dry eye syndrome; MGD meibomian gland dysfunction; ATs artificial tears; PFAT's preservative free artificial tears; [place] nuclear sclerotic cataract; PSC posterior subcapsular cataract; ERM epi-retinal membrane; PVD posterior vitreous detachment; RD retinal detachment; DM diabetes mellitus; DR diabetic retinopathy; NPDR non-proliferative diabetic retinopathy; PDR proliferative diabetic retinopathy; CSME clinically significant macular edema; DME diabetic macular edema; dbh dot blot hemorrhages; CWS cotton wool spot; POAG primary open angle glaucoma; C/D cup-to-disc ratio; HVF humphrey visual field; GVF goldmann visual field; OCT optical coherence tomography; IOP intraocular pressure; BRVO Branch retinal vein occlusion; CRVO central retinal vein occlusion; CRAO central retinal artery occlusion; BRAO branch retinal artery occlusion; RT retinal tear; SB scleral buckle; PPV pars plana vitrectomy; VH Vitreous hemorrhage; PRP panretinal laser photocoagulation; IVK intravitreal kenalog; VMT vitreomacular traction; MH Macular hole;  NVD neovascularization of the disc; NVE neovascularization elsewhere; AREDS age related eye disease study; ARMD age related macular degeneration;  POAG primary open angle glaucoma; EBMD epithelial/anterior basement membrane dystrophy; ACIOL anterior chamber intraocular lens; IOL intraocular lens; PCIOL posterior chamber intraocular lens; Phaco/IOL phacoemulsification with intraocular lens placement; [place] photorefractive keratectomy; LASIK laser assisted in situ keratomileusis; HTN hypertension; DM diabetes mellitus; COPD chronic obstructive pulmonary disease

## 2021-08-23 ENCOUNTER — Encounter (INDEPENDENT_AMBULATORY_CARE_PROVIDER_SITE_OTHER): Payer: Self-pay | Admitting: Ophthalmology

## 2021-08-23 MED ORDER — BEVACIZUMAB CHEMO INJECTION 1.25MG/0.05ML SYRINGE FOR KALEIDOSCOPE
1.2500 mg | INTRAVITREAL | Status: AC | PRN
  Administered 2021-08-23: 1.25 mg via INTRAVITREAL

## 2021-09-04 NOTE — Progress Notes (Signed)
[facility] Note  09/18/2021     CHIEF COMPLAINT Patient presents for Retina Follow Up  HISTORY OF PRESENT ILLNESS: Ana Long is a 46 y.o. female who presents to the clinic today for:   HPI     Retina Follow Up   Patient presents with  Diabetic Retinopathy.  In both eyes.  Severity is moderate.  Duration of 4 weeks.  Since onset it is stable.  I, the attending physician,  performed the HPI with the patient and updated documentation appropriately.        Comments   Pt here for 4 wk ret f/u NPDR. Here for IVA OS only today.       Last edited by Bernarda Caffey, MD on 09/18/2021  2:54 PM.    Pt reports her breathing is a little better. Still on prednisone and BG still elevated.  Referring physician: [name] MD [place], [place] [id number]  HISTORICAL INFORMATION:   Selected notes from the [facility] Re-referred by Dr. [name] for DM exam   CURRENT MEDICATIONS: Current Outpatient Medications (Ophthalmic Drugs)  Medication Sig   Olopatadine HCl 0.2 % SOLN Place one drop into both eyes daily.   No current facility-administered medications for this visit. (Ophthalmic Drugs)   Current Outpatient Medications (Other)  Medication Sig   acyclovir (ZOVIRAX) 400 MG tablet TAKE 1 TABLET BY MOUTH TWICE A DAY   albuterol (PROAIR HFA) 108 (90 Base) MCG/ACT inhaler Inhale 2 puffs into the lungs every 4 (four) hours as needed for wheezing or shortness of breath (cough). (Patient taking differently: Inhale 2 puffs into the lungs every 4 (four) hours as needed for wheezing or shortness of breath (or coughing).)   atorvastatin (LIPITOR) 10 MG tablet Take 10 mg by mouth daily.   benzonatate (TESSALON) 200 MG capsule Take 1 capsule (200 mg total) by mouth 3 (three) times daily as needed for cough.   Blood Glucose Monitoring Suppl (ACCU-CHEK AVIVA PLUS) w/Device KIT Please check you blood sugars 4 times a day   CIPRODEX  OTIC suspension Place into the right ear.   DERMA-SMOOTHE/FS SCALP 0.01 % OIL Apply topically.   fluconazole (DIFLUCAN) 150 MG tablet TAKE 1 TABLET BY MOUTH NOW. THEN REPEAT IN 1 WEEK   furosemide (LASIX) 20 MG tablet Take 20 mg by mouth daily as needed for fluid or edema.   glucose blood (ACCU-CHEK SMARTVIEW) test strip TEST FOUR TIMES DAILY   HUMULIN R U-500 KWIKPEN 500 UNIT/ML kwikpen Inject into the skin.   hydrochlorothiazide (HYDRODIURIL) 12.5 MG tablet Take by mouth.   Insulin Pen Needle (B-D UF III MINI PEN NEEDLES) 31G X 5 MM MISC USE AS DIRECTED TO INJECT INSULIN FIVE A DAY E11.65   lactulose (CEPHULAC) 10 g packet Take 1 packet (10 g total) by mouth daily as needed. Reported on 07/29/2015 (Patient taking differently: Take 10 g by mouth daily as needed (for constipation).)   Lancets (ACCU-CHEK SOFT TOUCH) lancets Use as instructed   loratadine (CLARITIN) 10 MG tablet Take 1 tablet (10 mg total) by mouth 2 (two) times daily.   methocarbamol (ROBAXIN) 500 MG tablet Take 1 tablet (500 mg total) by mouth every 8 (eight) hours as needed.   mometasone-formoterol (DULERA) 200-5 MCG/ACT AERO Inhale 2 puffs into the lungs 2 (two) times daily.   montelukast (SINGULAIR) 10 MG tablet Take 1 tablet (10 mg total) by mouth at bedtime.   nitroGLYCERIN (NITRODUR - DOSED  IN MG/24 HR) 0.2 mg/hr patch Apply 1/4th patch to affected achilles, change daily   omeprazole (PRILOSEC) 20 MG capsule Take 1 capsule (20 mg total) by mouth daily before breakfast.   SPIRIVA RESPIMAT 1.25 MCG/ACT AERS SMARTSIG:2 Puff(s) Via Inhaler Daily   topiramate (TOPAMAX) 25 MG tablet Take by mouth.   Current Facility-Administered Medications (Other)  Medication Route   diphenhydrAMINE (BENADRYL) injection 12.5 mg Intramuscular   REVIEW OF SYSTEMS: ROS   Positive for: Gastrointestinal, Endocrine, Eyes, Respiratory Negative for: Constitutional, Neurological, Skin, Genitourinary, Musculoskeletal, HENT, Cardiovascular,  Psychiatric, Allergic/Imm, Heme/Lymph Last edited by Kingsley Spittle, COT on 09/18/2021  1:51 PM.     ALLERGIES Allergies  Allergen Reactions   Bee Venom Anaphylaxis and Swelling   Wasp Venom Anaphylaxis and Swelling   Citrus Hives and Itching   Latex Hives and Swelling   Penicillins Hives and Itching    Has patient had a PCN reaction causing immediate rash, facial/tongue/throat swelling, SOB or lightheadedness with hypotension: Yes Has patient had a PCN reaction causing severe rash involving mucus membranes or skin necrosis: Unk Has patient had a PCN reaction that required hospitalization: Was already in hosp Has patient had a PCN reaction occurring within the last 10 years: No If all of the above answers are "NO", then may proceed with Cephalosporin use.    Shellfish Allergy Other (See Comments) and Swelling    Patient is uncertain; stated that she tolerates this (??)   Soap Hives, Itching and Swelling    PUREX LAUNDRY DETERGENT   Tomato Hives and Itching   Banana Itching and Nausea Only   Chocolate Itching   Fluticasone Other (See Comments)    Per patient, makes sneeze and gag   Hydrocodone Itching and Nausea Only   Other Itching    Acidic foods- Itching [place]- Itching in the roof of the mouth   Adhesive [Tape] Itching and Rash    Allergic to  "leads"   Doxycycline Other (See Comments)    AFFECTS JOINTS   Liraglutide Nausea Only   Metformin And Related Nausea Only, Rash, Other (See Comments) and Diarrhea    GI UPSET, also   Red Dye Itching, Rash and Other (See Comments)    At eye doctor's office, the dilating drops- Skin breaks out, chest turned red, itching, face became swollen   PAST MEDICAL HISTORY Past Medical History:  Diagnosis Date   Acute nonintractable headache 12/26/2017   Arthritis    major joints   Asthma    prn inhaler   Asthma exacerbation 07/25/2013   Asthma in adult, severe persistent, with acute exacerbation 01/11/2018   Carpal tunnel syndrome  of right wrist 09/2012   Cataract    NS OU   Complication of anesthesia    states is hard to wake up post-op   Constipation 12/30/2014   Diabetes mellitus    intolerant to Metformin   Diabetic retinopathy ([place])    NPDR OU   Dry skin    hands    Family history of anesthesia complication    mother went into coma during c-section   Genital herpes 01/01/2006   on acyclovir BID    GERD (gastroesophageal reflux disease)    Herpes genital    Hyperglycemia 12/26/2017   Hypertensive retinopathy    OU   Low back pain 11/22/2012   Lower extremity edema 10/18/2012   Menorrhagia 11/06/2015   Morbid obesity with BMI of 50 to 60 07/23/2009   Obesity hypoventilation syndrome ([place]) 10/30/2012  Obesity, morbid ([place]) 01/10/2014   OSA on CPAP     AHI was on  11-17-12 to 120/hr, titrated to 15 cm with 3 cm EPR/    PCOS (polycystic ovarian syndrome)    Rheumatoid arthritis ([place])    Right ankle pain 06/19/2015   Right hip pain 09/04/2017   Right shoulder pain 06/01/2017   Seasonal allergies    Sleep apnea, obstructive    uses CPAP nightly - had sleep study 08/22/2007   Past Surgical History:  Procedure Laterality Date   CARPAL TUNNEL RELEASE Right 10/02/2012   Procedure: RIGHT CARPAL TUNNEL RELEASE ENDOSCOPIC;  Surgeon: Jolyn Nap, MD;  Location: [facility];  Service: Orthopedics;  Laterality: Right;   CHOLECYSTECTOMY N/A 12/02/2014   Procedure: LAPAROSCOPIC CHOLECYSTECTOMY;  Surgeon: Armandina Gemma, MD;  Location: [facility];  Service: General;  Laterality: N/A;   HYSTEROSCOPY WITH D & C  01-08-2002   FAMILY HISTORY Family History  Problem Relation Age of Onset   Other Mother    Diabetes Maternal Aunt    Heart attack Maternal Aunt    Alzheimer's disease Maternal Uncle    Cancer Maternal Uncle        prostate   Diabetes Paternal Aunt    Cancer Paternal Aunt        brain,mouth   Alzheimer's disease Paternal Aunt    Colon polyps Paternal Uncle    Diabetes Paternal Uncle    Diabetes  Maternal Grandmother    Diabetes Maternal Grandfather    Leukemia Cousin    Other Other    Obesity Other    Other Other    Colitis Neg Hx    SOCIAL HISTORY Social History   Tobacco Use   Smoking status: Former    Years: 10.00    Types: Cigarettes    Quit date: 05/24/2010    Years since quitting: 11.3   Smokeless tobacco: Never  Vaping Use   Vaping Use: Never used  Substance Use Topics   Alcohol use: Yes    Alcohol/week: 0.0 standard drinks of alcohol    Comment: socially   Drug use: No       OPHTHALMIC EXAM: Base Eye Exam     Visual Acuity (Snellen - Linear)       Right Left   Dist cc 20/30 20/50 -1   Dist ph cc 20/25 +2 20/25 +1    Correction: Glasses         Tonometry (Tonopen, 1:58 PM)       Right Left   Pressure 18 17         Pupils       Dark Light Shape React APD   Right 3 2 Round Brisk None   Left 3 2 Round Brisk None         Visual Fields (Counting fingers)       Left Right    Full Full         Extraocular Movement       Right Left    Full, Ortho Full, Ortho         Neuro/Psych     Oriented x3: Yes   Mood/Affect: Normal         Dilation     Both eyes: 1.0% Mydriacyl, 2.5% Phenylephrine @ 1:59 PM           Slit Lamp and Fundus Exam     Slit Lamp Exam       Right Left   Lids/Lashes  Dermatochalasis - upper lid, mild Meibomian gland dysfunction Dermatochalasis - upper lid, mild Meibomian gland dysfunction   Conjunctiva/Sclera White and quiet White and quiet   Cornea 1-2+Punctate epithelial erosions 2-3+fine Punctate epithelial erosions   Anterior Chamber deep and quiet deep and quiet   Iris round and dilated, No NVI round and dilated   Lens 1+ NS, 1+CC 1+NS, 1+CC   Anterior Vitreous mild vitreous syneresis mild vitreous syneresis, scattered vitreous condensations         Fundus Exam       Right Left   Disc Pink and sharp, +cupping Pink and sharp, +cupping   C/D Ratio 0.7 0.6   Macula good foveal reflex,  focal exudates ST macula, scattered MA, focal cystic changes ST macula - slightly increased, focal laser changes inferior macula good foveal reflex, scatttered MA's and exudate, central cystic changes -- slightly increased, focal cluster of exudates SN macula -  persistent, small cluster of MA central macula   Vessels attenuated, Tortuous Vascular attenuation, Tortuous, Copper wiring   Periphery Attached, scattered MA/exudates greatest nasally Attached, scattered MA and exudate greatest superior to disc, white without pressure temporally           Refraction     Wearing Rx       Sphere Cylinder Axis   Right -2.50 +1.50 013   Left -2.25 Sphere     Type: SVL           IMAGING AND PROCEDURES  Imaging and Procedures for '@TODAY' @  OCT, Retina - OU - Both Eyes       Right Eye Quality was good. Central Foveal Thickness: 223. Progression has worsened. Findings include normal foveal contour, no SRF, intraretinal hyper-reflective material, intraretinal fluid, vitreomacular adhesion (Interval increase in IRF/IRHM ST macula ).   Left Eye Quality was good. Central Foveal Thickness: 282. Progression has worsened. Findings include no SRF, abnormal foveal contour, intraretinal hyper-reflective material, intraretinal fluid (Interval increase in IRF nasal macula and fovea).   Notes *Images captured and stored on drive  Diagnosis / Impression:  +DME OU OD: Interval increase in IRF/IRHM ST macula  OS: Interval increase in IRF nasal macula and fovea  Clinical management:  See below  Abbreviations: NFP - Normal foveal profile. CME - cystoid macular edema. PED - pigment epithelial detachment. IRF - intraretinal fluid. SRF - subretinal fluid. EZ - ellipsoid zone. ERM - epiretinal membrane. ORA - outer retinal atrophy. ORT - outer retinal tubulation. SRHM - subretinal hyper-reflective material      Intravitreal Injection, Pharmacologic Agent - OS - Left Eye       Time Out 09/18/2021.  2:20 PM. Confirmed correct patient, procedure, site, and patient consented.   Anesthesia Topical anesthesia was used. Anesthetic medications included Lidocaine 2%, Proparacaine 0.5%.   Procedure Preparation included 5% betadine to ocular surface, eyelid speculum. A supplied (32g) needle was used.   Injection: 1.25 mg Bevacizumab 1.39m/0.05ml   Route: Intravitreal, Site: Left Eye   NDC:: 64158-309-40 Lot: [id number] Expiration date: 10/26/2021   Post-op Post injection exam found visual acuity of at least counting fingers. The patient tolerated the procedure well. There were no complications. The patient received written and verbal post procedure care education. Post injection medications were not given.            ASSESSMENT/PLAN:    ICD-10-CM   1. Moderate nonproliferative diabetic retinopathy of both eyes with macular edema associated with type 2 diabetes mellitus ([place]  ES31.5945OCT, Retina - OU - Both  Eyes    Intravitreal Injection, Pharmacologic Agent - OS - Left Eye    Bevacizumab (AVASTIN) SOLN 1.25 mg    2. Essential hypertension  I10     3. Hypertensive retinopathy of both eyes  H35.033     4. Nuclear sclerosis of both eyes  H25.13      1.  Moderate non-proliferative diabetic retinopathy w/ DME, OU  - pt lost to f/u from 10.23.2019 to 11.18.20  - FA in 2019 showed leaking MA OU; no NV OU  **of note, pt had pruritic rxn to fluorescein dye -- resolved with 12.5 mg IV benadryl**  - FA 12.16.2020 shows late leaking MA's, non-perfusion defects; no NV OU  - s/p IVA OD #1 (12.16.20), #2 (01.25.21), #3 (05.26.21), #4 (07.02.21), #5 (10.15.21), #6 (11.23.21), #7 (07.27.22), #8 (09.09.22), #9 (11.11.22), #10 (01.20.23), #11 (02.23.23), #12 (04.09.23), #13 (05.23.23), #14 (06.30.23)  - s/p focal laser OD (03.19.21), (04.01.22)  - s/p IVA OS #1 OS (12.23.20), #2 (01.27.21), #3 (03.02.21), #4 (05.26.21), #5 (07.02.21), #6 (10.15.21), #7 (11.23.21), #8 (01.11.22), #9 (03.04.22), #10  (04.27.22), #11 (11.14.22), #12 (01.10.23), #13 (02.20.23), #14 (04.05.23), #15 (05.19.23), #16 (06.23.23)  - exam shows scattered MA, IRH, exudates and mild macular edema OU  - BCVA 20/25 from 20/20 OD, 20/25 OS  - OCT shows OD: Interval increase in IRF/IRHM ST macula; [id number] increase in IRF nasal macula and fovea             - Recommend IVA OS #17 today, 07.28.23  - pt wishes to proceed with injection  - RBA of procedure discussed, questions answered - see procedure note  - Avastin informed consent form re-signed and scanned on 05.19.2023  - f/u next week as scheduled -- DFE, OCT, possible injection (IVA OD)  2,3. Hypertensive retinopathy OU  - discussed importance of tight BP control  - continue to monitor   4. Nuclear Sclerosis   - The symptoms of cataract, surgical options, and treatments and risks were discussed with patient.   - discussed diagnosis and progression  - under the expert management of [facility] Ordered this visit:  Meds ordered this encounter  Medications   Bevacizumab (AVASTIN) SOLN 1.25 mg     Return for As scheduled 08.04.23 .  There are no Patient Instructions on file for this visit.  Explained the diagnoses, plan, and follow up with the patient and they expressed understanding.  Patient expressed understanding of the importance of proper follow up care.  This document serves as a record of services personally performed by Gardiner Sleeper, MD, PhD. It was created on their behalf by Leonie Douglas, an ophthalmic technician. The creation of this record is the provider's dictation and/or activities during the visit.    Electronically signed by: Leonie Douglas COA, 09/18/21  2:56 PM  This document serves as a record of services personally performed by Gardiner Sleeper, MD, PhD. It was created on their behalf by Renaldo Reel, [place] an ophthalmic technician. The creation of this record is the provider's dictation and/or activities  during the visit.    Electronically signed by:  Renaldo Reel, COT  09/18/21  2:56 PM  Gardiner Sleeper, M.D., Ph.D. Diseases & Surgery of the Retina and Vitreous [facility]  I have reviewed the above documentation for accuracy and completeness, and I agree with the above. Gardiner Sleeper, M.D., Ph.D. 09/18/21 2:58 PM   Abbreviations: M myopia (nearsighted); A astigmatism; H  hyperopia (farsighted); P presbyopia; Mrx spectacle prescription;  CTL contact lenses; OD right eye; OS left eye; OU both eyes  XT exotropia; ET esotropia; PEK punctate epithelial keratitis; PEE punctate epithelial erosions; DES dry eye syndrome; MGD meibomian gland dysfunction; ATs artificial tears; PFAT's preservative free artificial tears; [place] nuclear sclerotic cataract; PSC posterior subcapsular cataract; ERM epi-retinal membrane; PVD posterior vitreous detachment; RD retinal detachment; DM diabetes mellitus; DR diabetic retinopathy; NPDR non-proliferative diabetic retinopathy; PDR proliferative diabetic retinopathy; CSME clinically significant macular edema; DME diabetic macular edema; dbh dot blot hemorrhages; CWS cotton wool spot; POAG primary open angle glaucoma; C/D cup-to-disc ratio; HVF humphrey visual field; GVF goldmann visual field; OCT optical coherence tomography; IOP intraocular pressure; BRVO Branch retinal vein occlusion; CRVO central retinal vein occlusion; CRAO central retinal artery occlusion; BRAO branch retinal artery occlusion; RT retinal tear; SB scleral buckle; PPV pars plana vitrectomy; VH Vitreous hemorrhage; PRP panretinal laser photocoagulation; IVK intravitreal kenalog; VMT vitreomacular traction; MH Macular hole;  NVD neovascularization of the disc; NVE neovascularization elsewhere; AREDS age related eye disease study; ARMD age related macular degeneration; POAG primary open angle glaucoma; EBMD epithelial/anterior basement membrane dystrophy; ACIOL anterior chamber  intraocular lens; IOL intraocular lens; PCIOL posterior chamber intraocular lens; Phaco/IOL phacoemulsification with intraocular lens placement; [place] photorefractive keratectomy; LASIK laser assisted in situ keratomileusis; HTN hypertension; DM diabetes mellitus; COPD chronic obstructive pulmonary disease

## 2021-09-11 NOTE — Progress Notes (Signed)
[facility] Note  09/25/2021     CHIEF COMPLAINT Patient presents for Retina Follow Up  HISTORY OF PRESENT ILLNESS: Ana Long is a 46 y.o. female who presents to the clinic today for:   HPI     Retina Follow Up   Patient presents with  Diabetic Retinopathy.  In both eyes.  This started 5 weeks ago.  I, the attending physician,  performed the HPI with the patient and updated documentation appropriately.        Comments   Patient here for 5 weeks retina follow up for NPDR OU. IVA OD. Patient states vision doing ok. About the same. No eye pain. Has pain of LUL it is sore. And crustiness BUL.       Last edited by Bernarda Caffey, MD on 09/25/2021  2:37 PM.     Pt reports her breathing is a little better. Still on prednisone and BG still elevated.  Referring physician: [name] MD [place], [place] [address]  HISTORICAL INFORMATION:   Selected notes from the [place] [facility] Re-referred by Dr. [name] for DM exam   CURRENT MEDICATIONS: Current Outpatient Medications (Ophthalmic Drugs)  Medication Sig   Olopatadine HCl 0.2 % SOLN Place one drop into both eyes daily.   No current facility-administered medications for this visit. (Ophthalmic Drugs)   Current Outpatient Medications (Other)  Medication Sig   acyclovir (ZOVIRAX) 400 MG tablet TAKE 1 TABLET BY MOUTH TWICE A DAY   albuterol (PROAIR HFA) 108 (90 Base) MCG/ACT inhaler Inhale 2 puffs into the lungs every 4 (four) hours as needed for wheezing or shortness of breath (cough). (Patient taking differently: Inhale 2 puffs into the lungs every 4 (four) hours as needed for wheezing or shortness of breath (or coughing).)   atorvastatin (LIPITOR) 10 MG tablet Take 10 mg by mouth daily.   benzonatate (TESSALON) 200 MG capsule Take 1 capsule (200 mg total) by mouth 3 (three) times daily as needed for cough.   Blood Glucose Monitoring Suppl (ACCU-CHEK AVIVA PLUS)  w/Device KIT Please check you blood sugars 4 times a day   CIPRODEX OTIC suspension Place into the right ear.   DERMA-SMOOTHE/FS SCALP 0.01 % OIL Apply topically.   fluconazole (DIFLUCAN) 150 MG tablet TAKE 1 TABLET BY MOUTH NOW. THEN REPEAT IN 1 WEEK   furosemide (LASIX) 20 MG tablet Take 20 mg by mouth daily as needed for fluid or edema.   glucose blood (ACCU-CHEK SMARTVIEW) test strip TEST FOUR TIMES DAILY   HUMULIN R U-500 KWIKPEN 500 UNIT/ML kwikpen Inject into the skin.   hydrochlorothiazide (HYDRODIURIL) 12.5 MG tablet Take by mouth.   Insulin Pen Needle (B-D UF III MINI PEN NEEDLES) 31G X 5 MM MISC USE AS DIRECTED TO INJECT INSULIN FIVE A DAY E11.65   lactulose (CEPHULAC) 10 g packet Take 1 packet (10 g total) by mouth daily as needed. Reported on 07/29/2015 (Patient taking differently: Take 10 g by mouth daily as needed (for constipation).)   Lancets (ACCU-CHEK SOFT TOUCH) lancets Use as instructed   loratadine (CLARITIN) 10 MG tablet Take 1 tablet (10 mg total) by mouth 2 (two) times daily.   methocarbamol (ROBAXIN) 500 MG tablet Take 1 tablet (500 mg total) by mouth every 8 (eight) hours as needed.   mometasone-formoterol (DULERA) 200-5 MCG/ACT AERO Inhale 2 puffs into the lungs 2 (two) times daily.   montelukast (SINGULAIR) 10 MG tablet Take 1 tablet (10  mg total) by mouth at bedtime.   nitroGLYCERIN (NITRODUR - DOSED IN MG/24 HR) 0.2 mg/hr patch Apply 1/4th patch to affected achilles, change daily   omeprazole (PRILOSEC) 20 MG capsule Take 1 capsule (20 mg total) by mouth daily before breakfast.   SPIRIVA RESPIMAT 1.25 MCG/ACT AERS SMARTSIG:2 Puff(s) Via Inhaler Daily   topiramate (TOPAMAX) 25 MG tablet Take by mouth.   Current Facility-Administered Medications (Other)  Medication Route   diphenhydrAMINE (BENADRYL) injection 12.5 mg Intramuscular   REVIEW OF SYSTEMS: ROS   Positive for: Gastrointestinal, Endocrine, Eyes, Respiratory Negative for: Constitutional, Neurological,  Skin, Genitourinary, Musculoskeletal, HENT, Cardiovascular, Psychiatric, Allergic/Imm, Heme/Lymph Last edited by Theodore Demark, COA on 09/25/2021  2:01 PM.     ALLERGIES Allergies  Allergen Reactions   Bee Venom Anaphylaxis and Swelling   Wasp Venom Anaphylaxis and Swelling   Citrus Hives and Itching   Latex Hives and Swelling   Penicillins Hives and Itching    Has patient had a PCN reaction causing immediate rash, facial/tongue/throat swelling, SOB or lightheadedness with hypotension: Yes Has patient had a PCN reaction causing severe rash involving mucus membranes or skin necrosis: Unk Has patient had a PCN reaction that required hospitalization: Was already in hosp Has patient had a PCN reaction occurring within the last 10 years: No If all of the above answers are "NO", then may proceed with Cephalosporin use.    Shellfish Allergy Other (See Comments) and Swelling    Patient is uncertain; stated that she tolerates this (??)   Soap Hives, Itching and Swelling    PUREX LAUNDRY DETERGENT   Tomato Hives and Itching   Banana Itching and Nausea Only   Chocolate Itching   Fluticasone Other (See Comments)    Per patient, makes sneeze and gag   Hydrocodone Itching and Nausea Only   Other Itching    Acidic foods- Itching [place]- Itching in the roof of the mouth   Adhesive [Tape] Itching and Rash    Allergic to  "leads"   Doxycycline Other (See Comments)    AFFECTS JOINTS   Liraglutide Nausea Only   Metformin And Related Nausea Only, Rash, Other (See Comments) and Diarrhea    GI UPSET, also   Red Dye Itching, Rash and Other (See Comments)    At eye doctor's office, the dilating drops- Skin breaks out, chest turned red, itching, face became swollen   PAST MEDICAL HISTORY Past Medical History:  Diagnosis Date   Acute nonintractable headache 12/26/2017   Arthritis    major joints   Asthma    prn inhaler   Asthma exacerbation 07/25/2013   Asthma in adult, severe persistent, with  acute exacerbation 01/11/2018   Carpal tunnel syndrome of right wrist 09/2012   Cataract    NS OU   Complication of anesthesia    states is hard to wake up post-op   Constipation 12/30/2014   Diabetes mellitus    intolerant to Metformin   Diabetic retinopathy (HCC)    NPDR OU   Dry skin    hands    Family history of anesthesia complication    mother went into coma during c-section   Genital herpes 01/01/2006   on acyclovir BID    GERD (gastroesophageal reflux disease)    Herpes genital    Hyperglycemia 12/26/2017   Hypertensive retinopathy    OU   Low back pain 11/22/2012   Lower extremity edema 10/18/2012   Menorrhagia 11/06/2015   Morbid obesity with BMI of 50  to 60 07/23/2009   Obesity hypoventilation syndrome ([place]) 10/30/2012   Obesity, morbid ([place]) 01/10/2014   OSA on CPAP     AHI was on  11-17-12 to 120/hr, titrated to 15 cm with 3 cm EPR/    PCOS (polycystic ovarian syndrome)    Rheumatoid arthritis ([place])    Right ankle pain 06/19/2015   Right hip pain 09/04/2017   Right shoulder pain 06/01/2017   Seasonal allergies    Sleep apnea, obstructive    uses CPAP nightly - had sleep study 08/22/2007   Past Surgical History:  Procedure Laterality Date   CARPAL TUNNEL RELEASE Right 10/02/2012   Procedure: RIGHT CARPAL TUNNEL RELEASE ENDOSCOPIC;  Surgeon: Jolyn Nap, MD;  Location: [facility];  Service: Orthopedics;  Laterality: Right;   CHOLECYSTECTOMY N/A 12/02/2014   Procedure: LAPAROSCOPIC CHOLECYSTECTOMY;  Surgeon: Armandina Gemma, MD;  Location: [facility];  Service: General;  Laterality: N/A;   HYSTEROSCOPY WITH D & C  01-08-2002   FAMILY HISTORY Family History  Problem Relation Age of Onset   Other Mother    Diabetes Maternal Aunt    Heart attack Maternal Aunt    Alzheimer's disease Maternal Uncle    Cancer Maternal Uncle        prostate   Diabetes Paternal Aunt    Cancer Paternal Aunt        brain,mouth   Alzheimer's disease Paternal Aunt    Colon polyps  Paternal Uncle    Diabetes Paternal Uncle    Diabetes Maternal Grandmother    Diabetes Maternal Grandfather    Leukemia Cousin    Other Other    Obesity Other    Other Other    Colitis Neg Hx    SOCIAL HISTORY Social History   Tobacco Use   Smoking status: Former    Years: 10.00    Types: Cigarettes    Quit date: 05/24/2010    Years since quitting: 11.3   Smokeless tobacco: Never  Vaping Use   Vaping Use: Never used  Substance Use Topics   Alcohol use: Yes    Alcohol/week: 0.0 standard drinks of alcohol    Comment: socially   Drug use: No       OPHTHALMIC EXAM: Base Eye Exam     Visual Acuity (Snellen - Linear)       Right Left   Dist cc 20/25 20/30 -1   Dist ph cc 20/20 -1 20/20 -1    Correction: Glasses         Tonometry (Tonopen, 1:58 PM)       Right Left   Pressure 14 13         Pupils       Dark Light Shape React APD   Right 3 2 Round Brisk None   Left 3 2 Round Brisk None         Visual Fields (Counting fingers)       Left Right    Full Full         Extraocular Movement       Right Left    Full, Ortho Full, Ortho         Neuro/Psych     Oriented x3: Yes   Mood/Affect: Normal         Dilation     Right eye: 1.0% Mydriacyl, 2.5% Phenylephrine @ 1:58 PM           Slit Lamp and Fundus Exam     Slit Lamp  Exam       Right Left   Lids/Lashes Dermatochalasis - upper lid, mild Meibomian gland dysfunction Dermatochalasis - upper lid, mild Meibomian gland dysfunction   Conjunctiva/Sclera White and quiet White and quiet   Cornea 1-2+Punctate epithelial erosions 2-3+fine Punctate epithelial erosions   Anterior Chamber deep and quiet deep and quiet   Iris round and dilated, No NVI round and dilated   Lens 1+ NS, 1+CC 1+NS, 1+CC   Anterior Vitreous mild vitreous syneresis mild vitreous syneresis, scattered vitreous condensations         Fundus Exam       Right Left   Disc Pink and sharp, +cupping Pink and sharp,  +cupping   C/D Ratio 0.7 0.6   Macula good foveal reflex, focal exudates ST macula, scattered MA, focal cystic changes ST macula - slightly increased, focal laser changes inferior macula good foveal reflex, scatttered MA's and exudate, central cystic changes -- slightly increased, focal cluster of exudates SN macula -  persistent, small cluster of MA central macula   Vessels attenuated, Tortuous Vascular attenuation, Tortuous, Copper wiring   Periphery Attached, scattered MA/exudates greatest nasally Attached, scattered MA and exudate greatest superior to disc, white without pressure temporally           Refraction     Wearing Rx       Sphere Cylinder Axis   Right -2.50 +1.50 013   Left -2.25 Sphere     Type: SVL           IMAGING AND PROCEDURES  Imaging and Procedures for '@TODAY' @  OCT, Retina - OU - Both Eyes       Right Eye Quality was good. Central Foveal Thickness: 223. Progression has improved. Findings include normal foveal contour, no SRF, intraretinal hyper-reflective material, intraretinal fluid, vitreomacular adhesion (Mild Interval improvement in IRF/IRHM ST macula ).   Left Eye Quality was good. Central Foveal Thickness: 282. Progression has improved. Findings include no SRF, abnormal foveal contour, intraretinal hyper-reflective material, intraretinal fluid (Mild Interval improvement in IRF SN macula and fovea).   Notes *Images captured and stored on drive  Diagnosis / Impression:  +DME OU OD: mild Interval improvement in IRF/IRHM ST macula  OS: Mild Interval improvement in IRF SN macula and fovea  Clinical management:  See below  Abbreviations: NFP - Normal foveal profile. CME - cystoid macular edema. PED - pigment epithelial detachment. IRF - intraretinal fluid. SRF - subretinal fluid. EZ - ellipsoid zone. ERM - epiretinal membrane. ORA - outer retinal atrophy. ORT - outer retinal tubulation. SRHM - subretinal hyper-reflective material       Intravitreal Injection, Pharmacologic Agent - OD - Right Eye       Time Out 09/25/2021. 2:40 PM. Confirmed correct patient, procedure, site, and patient consented.   Anesthesia Topical anesthesia was used. Anesthetic medications included Lidocaine 2%, Proparacaine 0.5%.   Procedure Preparation included 5% betadine to ocular surface, eyelid speculum. A (32g) needle was used.   Injection: 1.25 mg Bevacizumab 1.71m/0.05ml   Route: Intravitreal, Site: Right Eye   NDC:: 56153-794-32 Lot:: [id number] Expiration date: 12/25/2021   Post-op Post injection exam found visual acuity of at least counting fingers. The patient tolerated the procedure well. There were no complications. The patient received written and verbal post procedure care education. Post injection medications were not given.            ASSESSMENT/PLAN:    ICD-10-CM   1. Moderate nonproliferative diabetic retinopathy of both eyes with macular edema  associated with type 2 diabetes mellitus (HCC)  E11.3313 OCT, Retina - OU - Both Eyes    Intravitreal Injection, Pharmacologic Agent - OD - Right Eye    Bevacizumab (AVASTIN) SOLN 1.25 mg    2. Essential hypertension  I10     3. Hypertensive retinopathy of both eyes  H35.033     4. Nuclear sclerosis of both eyes  H25.13      1.  Moderate non-proliferative diabetic retinopathy w/ DME, OU  - pt lost to f/u from 10.23.2019 to 11.18.20  - FA in 2019 showed leaking MA OU; no NV OU  **of note, pt had pruritic rxn to fluorescein dye -- resolved with 12.5 mg IV benadryl**  - FA 12.16.2020 shows late leaking MA's, non-perfusion defects; no NV OU  - s/p IVA OD #1 (12.16.20), #2 (01.25.21), #3 (05.26.21), #4 (07.02.21), #5 (10.15.21), #6 (11.23.21), #7 (07.27.22), #8 (09.09.22), #9 (11.11.22), #10 (01.20.23), #11 (02.23.23), #12 (04.09.23), #13 (05.23.23), #14 (06.30.23)  - s/p focal laser OD (03.19.21), (04.01.22)  - s/p IVA OS #1 (12.23.20), #2 (01.27.21), #3 (03.02.21), #4  (05.26.21), #5 (07.02.21), #6 (10.15.21), #7 (11.23.21), #8 (01.11.22), #9 (03.04.22), #10 (04.27.22), #11 (11.14.22), #12 (01.10.23), #13 (02.20.23), #14 (04.05.23), #15 (05.19.23), #16 (06.23.23)  - exam shows scattered MA, IRH, exudates and mild macular edema OU  - BCVA 20/20 OU  - OCT shows OD: mild Interval improvement in IRF/IRHM ST macula; OS: Mild Interval improvement in IRF SN macula and fovea             - Recommend IVA OD #15 today, 08.04.23  - pt wishes to proceed with injection  - RBA of procedure discussed, questions answered - see procedure note  - Avastin informed consent form re-signed and scanned on 05.19.2023  - f/u 4 wks -- DFE, OCT, possible injection (IVA OS)  2,3. Hypertensive retinopathy OU  - discussed importance of tight BP control  - monitor   4. Nuclear Sclerosis   - The symptoms of cataract, surgical options, and treatments and risks were discussed with patient.   - discussed diagnosis and progression  - under the expert management of [facility] Ordered this visit:  Meds ordered this encounter  Medications   Bevacizumab (AVASTIN) SOLN 1.25 mg     Return in about 4 weeks (around 10/23/2021) for f/u NPDR OU, DFE, OCT.  There are no Patient Instructions on file for this visit.  Explained the diagnoses, plan, and follow up with the patient and they expressed understanding.  Patient expressed understanding of the importance of proper follow up care.  This document serves as a record of services personally performed by Gardiner Sleeper, MD, PhD. It was created on their behalf by Renaldo Reel, [place] an ophthalmic technician. The creation of this record is the provider's dictation and/or activities during the visit.    Electronically signed by:  Renaldo Reel, COT  09/11/21 2:10 AM   Gardiner Sleeper, M.D., Ph.D. Diseases & Surgery of the Retina and Vitreous [facility]  I have reviewed the above  documentation for accuracy and completeness, and I agree with the above. Gardiner Sleeper, M.D., Ph.D. 09/26/21 2:13 AM   Abbreviations: M myopia (nearsighted); A astigmatism; H hyperopia (farsighted); P presbyopia; Mrx spectacle prescription;  CTL contact lenses; OD right eye; OS left eye; OU both eyes  XT exotropia; ET esotropia; PEK punctate epithelial keratitis; PEE punctate epithelial erosions; DES dry eye syndrome; MGD meibomian gland  dysfunction; ATs artificial tears; PFAT's preservative free artificial tears; [place] nuclear sclerotic cataract; PSC posterior subcapsular cataract; ERM epi-retinal membrane; PVD posterior vitreous detachment; RD retinal detachment; DM diabetes mellitus; DR diabetic retinopathy; NPDR non-proliferative diabetic retinopathy; PDR proliferative diabetic retinopathy; CSME clinically significant macular edema; DME diabetic macular edema; dbh dot blot hemorrhages; CWS cotton wool spot; POAG primary open angle glaucoma; C/D cup-to-disc ratio; HVF humphrey visual field; GVF goldmann visual field; OCT optical coherence tomography; IOP intraocular pressure; BRVO Branch retinal vein occlusion; CRVO central retinal vein occlusion; CRAO central retinal artery occlusion; BRAO branch retinal artery occlusion; RT retinal tear; SB scleral buckle; PPV pars plana vitrectomy; VH Vitreous hemorrhage; PRP panretinal laser photocoagulation; IVK intravitreal kenalog; VMT vitreomacular traction; MH Macular hole;  NVD neovascularization of the disc; NVE neovascularization elsewhere; AREDS age related eye disease study; ARMD age related macular degeneration; POAG primary open angle glaucoma; EBMD epithelial/anterior basement membrane dystrophy; ACIOL anterior chamber intraocular lens; IOL intraocular lens; PCIOL posterior chamber intraocular lens; Phaco/IOL phacoemulsification with intraocular lens placement; [place] photorefractive keratectomy; LASIK laser assisted in situ keratomileusis; HTN hypertension; DM  diabetes mellitus; COPD chronic obstructive pulmonary disease

## 2021-09-18 ENCOUNTER — Encounter (INDEPENDENT_AMBULATORY_CARE_PROVIDER_SITE_OTHER): Payer: Self-pay | Admitting: Ophthalmology

## 2021-09-18 ENCOUNTER — Ambulatory Visit (INDEPENDENT_AMBULATORY_CARE_PROVIDER_SITE_OTHER): Payer: Medicaid Other | Admitting: Ophthalmology

## 2021-09-18 DIAGNOSIS — H35033 Hypertensive retinopathy, bilateral: Secondary | ICD-10-CM

## 2021-09-18 DIAGNOSIS — E113313 Type 2 diabetes mellitus with moderate nonproliferative diabetic retinopathy with macular edema, bilateral: Secondary | ICD-10-CM | POA: Diagnosis not present

## 2021-09-18 DIAGNOSIS — H2513 Age-related nuclear cataract, bilateral: Secondary | ICD-10-CM

## 2021-09-18 DIAGNOSIS — I10 Essential (primary) hypertension: Secondary | ICD-10-CM | POA: Diagnosis not present

## 2021-09-18 MED ORDER — BEVACIZUMAB CHEMO INJECTION 1.25MG/0.05ML SYRINGE FOR KALEIDOSCOPE
1.2500 mg | INTRAVITREAL | Status: AC | PRN
  Administered 2021-09-18: 1.25 mg via INTRAVITREAL

## 2021-09-25 ENCOUNTER — Encounter (INDEPENDENT_AMBULATORY_CARE_PROVIDER_SITE_OTHER): Payer: Self-pay | Admitting: Ophthalmology

## 2021-09-25 ENCOUNTER — Ambulatory Visit (INDEPENDENT_AMBULATORY_CARE_PROVIDER_SITE_OTHER): Payer: Medicaid Other | Admitting: Ophthalmology

## 2021-09-25 DIAGNOSIS — H2513 Age-related nuclear cataract, bilateral: Secondary | ICD-10-CM

## 2021-09-25 DIAGNOSIS — I10 Essential (primary) hypertension: Secondary | ICD-10-CM | POA: Diagnosis not present

## 2021-09-25 DIAGNOSIS — E113313 Type 2 diabetes mellitus with moderate nonproliferative diabetic retinopathy with macular edema, bilateral: Secondary | ICD-10-CM | POA: Diagnosis not present

## 2021-09-25 DIAGNOSIS — H35033 Hypertensive retinopathy, bilateral: Secondary | ICD-10-CM

## 2021-09-25 MED ORDER — BEVACIZUMAB CHEMO INJECTION 1.25MG/0.05ML SYRINGE FOR KALEIDOSCOPE
1.2500 mg | INTRAVITREAL | Status: AC | PRN
  Administered 2021-09-25: 1.25 mg via INTRAVITREAL

## 2021-10-15 NOTE — Progress Notes (Signed)
[facility] Note  10/23/2021     CHIEF COMPLAINT Patient presents for Retina Follow Up  HISTORY OF PRESENT ILLNESS: Ana Long is a 46 y.o. female who presents to the clinic today for:   HPI     Retina Follow Up   Patient presents with  Diabetic Retinopathy.  In left eye.  This started 4 weeks ago.  I, the attending physician,  performed the HPI with the patient and updated documentation appropriately.        Comments   Patient here for 4 weeks retina follow up for NPDR OS. Patient states vision not doing that good. Sometimes it is blurry. Sometimes sees a figure that is not there.      Last edited by Bernarda Caffey, MD on 10/23/2021  3:26 PM.    Pt states her vision is not doing well, her A1c has gone back up due to being on steroids for breathing, she states they are trying to put her back on prednisone bc of tonsillitis   Referring physician: [name] MD [place], [place] [address]  HISTORICAL INFORMATION:   Selected notes from the [facility] Re-referred by Dr. [name] for DM exam   CURRENT MEDICATIONS: Current Outpatient Medications (Ophthalmic Drugs)  Medication Sig   Olopatadine HCl 0.2 % SOLN Place one drop into both eyes daily.   No current facility-administered medications for this visit. (Ophthalmic Drugs)   Current Outpatient Medications (Other)  Medication Sig   acyclovir (ZOVIRAX) 400 MG tablet TAKE 1 TABLET BY MOUTH TWICE A DAY   albuterol (PROAIR HFA) 108 (90 Base) MCG/ACT inhaler Inhale 2 puffs into the lungs every 4 (four) hours as needed for wheezing or shortness of breath (cough). (Patient taking differently: Inhale 2 puffs into the lungs every 4 (four) hours as needed for wheezing or shortness of breath (or coughing).)   atorvastatin (LIPITOR) 10 MG tablet Take 10 mg by mouth daily.   benzonatate (TESSALON) 200 MG capsule Take 1 capsule (200 mg total) by mouth 3 (three) times daily  as needed for cough.   Blood Glucose Monitoring Suppl (ACCU-CHEK AVIVA PLUS) w/Device KIT Please check you blood sugars 4 times a day   CIPRODEX OTIC suspension Place into the right ear.   DERMA-SMOOTHE/FS SCALP 0.01 % OIL Apply topically.   fluconazole (DIFLUCAN) 150 MG tablet TAKE 1 TABLET BY MOUTH NOW. THEN REPEAT IN 1 WEEK   furosemide (LASIX) 20 MG tablet Take 20 mg by mouth daily as needed for fluid or edema.   glucose blood (ACCU-CHEK SMARTVIEW) test strip TEST FOUR TIMES DAILY   HUMULIN R U-500 KWIKPEN 500 UNIT/ML kwikpen Inject into the skin.   hydrochlorothiazide (HYDRODIURIL) 12.5 MG tablet Take by mouth.   Insulin Pen Needle (B-D UF III MINI PEN NEEDLES) 31G X 5 MM MISC USE AS DIRECTED TO INJECT INSULIN FIVE A DAY E11.65   lactulose (CEPHULAC) 10 g packet Take 1 packet (10 g total) by mouth daily as needed. Reported on 07/29/2015 (Patient taking differently: Take 10 g by mouth daily as needed (for constipation).)   Lancets (ACCU-CHEK SOFT TOUCH) lancets Use as instructed   loratadine (CLARITIN) 10 MG tablet Take 1 tablet (10 mg total) by mouth 2 (two) times daily.   methocarbamol (ROBAXIN) 500 MG tablet Take 1 tablet (500 mg total) by mouth every 8 (eight) hours as needed.   mometasone-formoterol (DULERA) 200-5 MCG/ACT AERO Inhale 2 puffs into the lungs  2 (two) times daily.   montelukast (SINGULAIR) 10 MG tablet Take 1 tablet (10 mg total) by mouth at bedtime.   nitroGLYCERIN (NITRODUR - DOSED IN MG/24 HR) 0.2 mg/hr patch Apply 1/4th patch to affected achilles, change daily   omeprazole (PRILOSEC) 20 MG capsule Take 1 capsule (20 mg total) by mouth daily before breakfast.   SPIRIVA RESPIMAT 1.25 MCG/ACT AERS SMARTSIG:2 Puff(s) Via Inhaler Daily   topiramate (TOPAMAX) 25 MG tablet Take by mouth.   Current Facility-Administered Medications (Other)  Medication Route   diphenhydrAMINE (BENADRYL) injection 12.5 mg Intramuscular   REVIEW OF SYSTEMS: ROS   Positive for:  Gastrointestinal, Endocrine, Eyes, Respiratory Negative for: Constitutional, Neurological, Skin, Genitourinary, Musculoskeletal, HENT, Cardiovascular, Psychiatric, Allergic/Imm, Heme/Lymph Last edited by Theodore Demark, COA on 10/23/2021  2:17 PM.     ALLERGIES Allergies  Allergen Reactions   Bee Venom Anaphylaxis and Swelling   Wasp Venom Anaphylaxis and Swelling   Citrus Hives and Itching   Latex Hives and Swelling   Penicillins Hives and Itching    Has patient had a PCN reaction causing immediate rash, facial/tongue/throat swelling, SOB or lightheadedness with hypotension: Yes Has patient had a PCN reaction causing severe rash involving mucus membranes or skin necrosis: Unk Has patient had a PCN reaction that required hospitalization: Was already in hosp Has patient had a PCN reaction occurring within the last 10 years: No If all of the above answers are "NO", then may proceed with Cephalosporin use.    Shellfish Allergy Other (See Comments) and Swelling    Patient is uncertain; stated that she tolerates this (??)   Soap Hives, Itching and Swelling    PUREX LAUNDRY DETERGENT   Tomato Hives and Itching   Banana Itching and Nausea Only   Chocolate Itching   Fluticasone Other (See Comments)    Per patient, makes sneeze and gag   Hydrocodone Itching and Nausea Only   Other Itching    Acidic foods- Itching [place]- Itching in the roof of the mouth   Adhesive [Tape] Itching and Rash    Allergic to  "leads"   Doxycycline Other (See Comments)    AFFECTS JOINTS   Liraglutide Nausea Only   Metformin And Related Nausea Only, Rash, Other (See Comments) and Diarrhea    GI UPSET, also   Red Dye Itching, Rash and Other (See Comments)    At eye doctor's office, the dilating drops- Skin breaks out, chest turned red, itching, face became swollen   PAST MEDICAL HISTORY Past Medical History:  Diagnosis Date   Acute nonintractable headache 12/26/2017   Arthritis    major joints   Asthma     prn inhaler   Asthma exacerbation 07/25/2013   Asthma in adult, severe persistent, with acute exacerbation 01/11/2018   Carpal tunnel syndrome of right wrist 09/2012   Cataract    NS OU   Complication of anesthesia    states is hard to wake up post-op   Constipation 12/30/2014   Diabetes mellitus    intolerant to Metformin   Diabetic retinopathy (HCC)    NPDR OU   Dry skin    hands    Family history of anesthesia complication    mother went into coma during c-section   Genital herpes 01/01/2006   on acyclovir BID    GERD (gastroesophageal reflux disease)    Herpes genital    Hyperglycemia 12/26/2017   Hypertensive retinopathy    OU   Low back pain 11/22/2012   Lower  extremity edema 10/18/2012   Menorrhagia 11/06/2015   Morbid obesity with BMI of 50 to 60 07/23/2009   Obesity hypoventilation syndrome ([place]) 10/30/2012   Obesity, morbid ([place]) 01/10/2014   OSA on CPAP     AHI was on  11-17-12 to 120/hr, titrated to 15 cm with 3 cm EPR/    PCOS (polycystic ovarian syndrome)    Rheumatoid arthritis ([place])    Right ankle pain 06/19/2015   Right hip pain 09/04/2017   Right shoulder pain 06/01/2017   Seasonal allergies    Sleep apnea, obstructive    uses CPAP nightly - had sleep study 08/22/2007   Past Surgical History:  Procedure Laterality Date   CARPAL TUNNEL RELEASE Right 10/02/2012   Procedure: RIGHT CARPAL TUNNEL RELEASE ENDOSCOPIC;  Surgeon: Jolyn Nap, MD;  Location: [facility];  Service: Orthopedics;  Laterality: Right;   CHOLECYSTECTOMY N/A 12/02/2014   Procedure: LAPAROSCOPIC CHOLECYSTECTOMY;  Surgeon: Armandina Gemma, MD;  Location: [facility];  Service: General;  Laterality: N/A;   HYSTEROSCOPY WITH D & C  01-08-2002   FAMILY HISTORY Family History  Problem Relation Age of Onset   Other Mother    Diabetes Maternal Aunt    Heart attack Maternal Aunt    Alzheimer's disease Maternal Uncle    Cancer Maternal Uncle        prostate   Diabetes Paternal Aunt     Cancer Paternal Aunt        brain,mouth   Alzheimer's disease Paternal Aunt    Colon polyps Paternal Uncle    Diabetes Paternal Uncle    Diabetes Maternal Grandmother    Diabetes Maternal Grandfather    Leukemia Cousin    Other Other    Obesity Other    Other Other    Colitis Neg Hx    SOCIAL HISTORY Social History   Tobacco Use   Smoking status: Former    Years: 10.00    Types: Cigarettes    Quit date: 05/24/2010    Years since quitting: 11.4   Smokeless tobacco: Never  Vaping Use   Vaping Use: Never used  Substance Use Topics   Alcohol use: Yes    Alcohol/week: 0.0 standard drinks of alcohol    Comment: socially   Drug use: No       OPHTHALMIC EXAM: Base Eye Exam     Visual Acuity (Snellen - Linear)       Right Left   Dist cc 20/30 -2 20/30 -2   Dist ph cc 20/20 -2 20/20 -2    Correction: Glasses         Tonometry (Tonopen, 2:15 PM)       Right Left   Pressure 17 16         Pupils       Dark Light Shape React APD   Right 3 2 Round Brisk None   Left 3 2 Round Brisk None         Visual Fields (Counting fingers)       Left Right    Full Full         Extraocular Movement       Right Left    Full, Ortho Full, Ortho         Neuro/Psych     Oriented x3: Yes   Mood/Affect: Normal         Dilation     Both eyes: 1.0% Mydriacyl, 2.5% Phenylephrine @ 2:15 PM  Slit Lamp and Fundus Exam     Slit Lamp Exam       Right Left   Lids/Lashes Dermatochalasis - upper lid, mild Meibomian gland dysfunction Dermatochalasis - upper lid, mild Meibomian gland dysfunction   Conjunctiva/Sclera White and quiet White and quiet   Cornea 1-2+Punctate epithelial erosions 2-3+fine Punctate epithelial erosions   Anterior Chamber deep and quiet deep and quiet   Iris round and dilated, No NVI round and dilated   Lens 1+ NS, 1+CC 1+NS, 1+CC   Anterior Vitreous mild vitreous syneresis mild vitreous syneresis, scattered vitreous  condensations         Fundus Exam       Right Left   Disc Pink and sharp, +cupping Pink and sharp, +cupping   C/D Ratio 0.7 0.6   Macula good foveal reflex, focal exudates ST macula, scattered MA, focal cystic changes ST macula - slightly improved, focal laser changes inferior macula, focal exudates ST mac good foveal reflex, scatttered MA's and exudate, central cystic changes -- persistent, focal cluster of exudates SN macula -  persistent, small cluster of MA central macula   Vessels attenuated, Tortuous attenuated, Tortuous   Periphery Attached, scattered MA/exudates greatest nasally Attached, scattered MA and exudate greatest superior to disc, white without pressure temporally           Refraction     Wearing Rx       Sphere Cylinder Axis   Right -2.50 +1.50 013   Left -2.25 Sphere     Type: SVL           IMAGING AND PROCEDURES  Imaging and Procedures for _0 @  OCT, Retina - OU - Both Eyes       Right Eye Quality was good. Central Foveal Thickness: 224. Progression has improved. Findings include normal foveal contour, no SRF, intraretinal hyper-reflective material, intraretinal fluid, vitreomacular adhesion (Mild Interval improvement in IRF/IRHM ST macula ).   Left Eye Quality was good. Central Foveal Thickness: 283. Progression has improved. Findings include no SRF, abnormal foveal contour, intraretinal hyper-reflective material, intraretinal fluid (persistent IRF SN macula and fovea -- slightly improved).   Notes *Images captured and stored on drive  Diagnosis / Impression:  +DME OU OD: mild Interval improvement in IRF/IRHM temporal macula  OS: persistent IRF SN macula and fovea -- slightly improved  Clinical management:  See below  Abbreviations: NFP - Normal foveal profile. CME - cystoid macular edema. PED - pigment epithelial detachment. IRF - intraretinal fluid. SRF - subretinal fluid. EZ - ellipsoid zone. ERM - epiretinal membrane. ORA - outer  retinal atrophy. ORT - outer retinal tubulation. SRHM - subretinal hyper-reflective material      Intravitreal Injection, Pharmacologic Agent - OD - Right Eye       Time Out 10/23/2021. 2:22 PM. Confirmed correct patient, procedure, site, and patient consented.   Anesthesia Topical anesthesia was used. Anesthetic medications included Lidocaine 2%, Proparacaine 0.5%.   Procedure Preparation included 5% betadine to ocular surface, eyelid speculum. A supplied (32g) needle was used.   Injection: 1.25 mg Bevacizumab 1.20m/0.05ml   Route: Intravitreal, Site: Right Eye   NDC: 50242-060-01, Lot: [id number] , Expiration date: 11/24/2021   Post-op Post injection exam found visual acuity of at least counting fingers. The patient tolerated the procedure well. There were no complications. The patient received written and verbal post procedure care education. Post injection medications were not given.      Intravitreal Injection, Pharmacologic Agent - OS - Left Eye  Time Out 10/23/2021. 2:39 PM. Confirmed correct patient, procedure, site, and patient consented.   Anesthesia Topical anesthesia was used. Anesthetic medications included Lidocaine 2%, Proparacaine 0.5%.   Procedure Preparation included 5% betadine to ocular surface, eyelid speculum. A (32g) needle was used.   Injection: 1.25 mg Bevacizumab 1.65m/0.05ml   Route: Intravitreal, Site: Left Eye   NDC:: 26712-458-09 Lot:: [id number] Expiration date: 12/01/2021   Post-op Post injection exam found visual acuity of at least counting fingers. The patient tolerated the procedure well. There were no complications. The patient received written and verbal post procedure care education. Post injection medications were not given.            ASSESSMENT/PLAN:    ICD-10-CM   1. Moderate nonproliferative diabetic retinopathy of both eyes with macular edema associated with type 2 diabetes mellitus (HCC)  E11.3313 OCT, Retina - OU -  Both Eyes    Intravitreal Injection, Pharmacologic Agent - OD - Right Eye    Intravitreal Injection, Pharmacologic Agent - OS - Left Eye    Bevacizumab (AVASTIN) SOLN 1.25 mg    Bevacizumab (AVASTIN) SOLN 1.25 mg    2. Essential hypertension  I10     3. Hypertensive retinopathy of both eyes  H35.033     4. Nuclear sclerosis of both eyes  H25.13      1.  Moderate non-proliferative diabetic retinopathy w/ DME, OU  - pt lost to f/u from 10.23.2019 to 11.18.20  - FA in 2019 showed leaking MA OU; no NV OU  **of note, pt had pruritic rxn to fluorescein dye -- resolved with 12.5 mg IV benadryl**  - FA 12.16.2020 shows late leaking MA's, non-perfusion defects; no NV OU  - s/p IVA OD #1 (12.16.20), #2 (01.25.21), #3 (05.26.21), #4 (07.02.21), #5 (10.15.21), #6 (11.23.21), #7 (07.27.22), #8 (09.09.22), #9 (11.11.22), #10 (01.20.23), #11 (02.23.23), #12 (04.09.23), #13 (05.23.23), #14 (06.30.23), #15 (08.04.23)  - s/p focal laser OD (03.19.21), (04.01.22)  - s/p IVA OS #1 (12.23.20), #2 (01.27.21), #3 (03.02.21), #4 (05.26.21), #5 (07.02.21), #6 (10.15.21), #7 (11.23.21), #8 (01.11.22), #9 (03.04.22), #10 (04.27.22), #11 (11.14.22), #12 (01.10.23), #13 (02.20.23), #14 (04.05.23), #15 (05.19.23), #16 (06.23.23), #17 (07.28.23)  - exam shows scattered MA, IRH, exudates and mild macular edema OU  - BCVA 20/20 OU  - OCT shows OD: mild Interval improvement in IRF/IRHM ST macula; OS: persistent IRF SN macula and fovea temporal macula             - Recommend IVA OU (OD #16 and OS #18) today, 09.01.23  - pt wishes to proceed with injections - RBA of procedure discussed, questions answered - see procedure note  - Avastin informed consent form re-signed and scanned on 05.19.2023  - f/u 5 wks -- DFE, OCT, possible injection(s)  2,3. Hypertensive retinopathy OU  - discussed importance of tight BP control  - monitor   4. Nuclear Sclerosis   - The symptoms of cataract, surgical options, and treatments and  risks were discussed with patient.   - discussed diagnosis and progression  - under the expert management of GRhodellOrdered this visit:  Meds ordered this encounter  Medications   Bevacizumab (AVASTIN) SOLN 1.25 mg   Bevacizumab (AVASTIN) SOLN 1.25 mg     Return in about 5 weeks (around 11/27/2021) for f/u NPDR , DFE, OCT.  There are no Patient Instructions on file for this visit.  Explained the diagnoses, plan, and follow up with the patient and they  expressed understanding.  Patient expressed understanding of the importance of proper follow up care.  This document serves as a record of services personally performed by Gardiner Sleeper, MD, PhD. It was created on their behalf by Renaldo Reel, [place] an ophthalmic technician. The creation of this record is the provider's dictation and/or activities during the visit.    Electronically signed by:  Renaldo Reel, COT  10/15/21 3:29 PM  This document serves as a record of services personally performed by Gardiner Sleeper, MD, PhD. It was created on their behalf by San Jetty. Owens Shark, OA an ophthalmic technician. The creation of this record is the provider's dictation and/or activities during the visit.    Electronically signed by: San Jetty. Owens Shark, [place] 09.01.2023 3:29 PM  Gardiner Sleeper, M.D., Ph.D. Diseases & Surgery of the Retina and Vitreous [facility]  I have reviewed the above documentation for accuracy and completeness, and I agree with the above. Gardiner Sleeper, M.D., Ph.D. 10/23/21 3:29 PM  Abbreviations: M myopia (nearsighted); A astigmatism; H hyperopia (farsighted); P presbyopia; Mrx spectacle prescription;  CTL contact lenses; OD right eye; OS left eye; OU both eyes  XT exotropia; ET esotropia; PEK punctate epithelial keratitis; PEE punctate epithelial erosions; DES dry eye syndrome; MGD meibomian gland dysfunction; ATs artificial tears; PFAT's preservative free artificial  tears; [place] nuclear sclerotic cataract; PSC posterior subcapsular cataract; ERM epi-retinal membrane; PVD posterior vitreous detachment; RD retinal detachment; DM diabetes mellitus; DR diabetic retinopathy; NPDR non-proliferative diabetic retinopathy; PDR proliferative diabetic retinopathy; CSME clinically significant macular edema; DME diabetic macular edema; dbh dot blot hemorrhages; CWS cotton wool spot; POAG primary open angle glaucoma; C/D cup-to-disc ratio; HVF humphrey visual field; GVF goldmann visual field; OCT optical coherence tomography; IOP intraocular pressure; BRVO Branch retinal vein occlusion; CRVO central retinal vein occlusion; CRAO central retinal artery occlusion; BRAO branch retinal artery occlusion; RT retinal tear; SB scleral buckle; PPV pars plana vitrectomy; VH Vitreous hemorrhage; PRP panretinal laser photocoagulation; IVK intravitreal kenalog; VMT vitreomacular traction; MH Macular hole;  NVD neovascularization of the disc; NVE neovascularization elsewhere; AREDS age related eye disease study; ARMD age related macular degeneration; POAG primary open angle glaucoma; EBMD epithelial/anterior basement membrane dystrophy; ACIOL anterior chamber intraocular lens; IOL intraocular lens; PCIOL posterior chamber intraocular lens; Phaco/IOL phacoemulsification with intraocular lens placement; [place] photorefractive keratectomy; LASIK laser assisted in situ keratomileusis; HTN hypertension; DM diabetes mellitus; COPD chronic obstructive pulmonary disease

## 2021-10-23 ENCOUNTER — Encounter (INDEPENDENT_AMBULATORY_CARE_PROVIDER_SITE_OTHER): Payer: Self-pay | Admitting: Ophthalmology

## 2021-10-23 ENCOUNTER — Ambulatory Visit (INDEPENDENT_AMBULATORY_CARE_PROVIDER_SITE_OTHER): Payer: Medicaid Other | Admitting: Ophthalmology

## 2021-10-23 DIAGNOSIS — H35033 Hypertensive retinopathy, bilateral: Secondary | ICD-10-CM | POA: Diagnosis not present

## 2021-10-23 DIAGNOSIS — I10 Essential (primary) hypertension: Secondary | ICD-10-CM | POA: Diagnosis not present

## 2021-10-23 DIAGNOSIS — H2513 Age-related nuclear cataract, bilateral: Secondary | ICD-10-CM

## 2021-10-23 DIAGNOSIS — E113313 Type 2 diabetes mellitus with moderate nonproliferative diabetic retinopathy with macular edema, bilateral: Secondary | ICD-10-CM | POA: Diagnosis not present

## 2021-10-23 MED ORDER — BEVACIZUMAB CHEMO INJECTION 1.25MG/0.05ML SYRINGE FOR KALEIDOSCOPE
1.2500 mg | INTRAVITREAL | Status: AC | PRN
  Administered 2021-10-23: 1.25 mg via INTRAVITREAL

## 2021-10-30 ENCOUNTER — Encounter (INDEPENDENT_AMBULATORY_CARE_PROVIDER_SITE_OTHER): Payer: Medicaid Other | Admitting: Ophthalmology

## 2021-10-30 DIAGNOSIS — H35033 Hypertensive retinopathy, bilateral: Secondary | ICD-10-CM

## 2021-10-30 DIAGNOSIS — I10 Essential (primary) hypertension: Secondary | ICD-10-CM

## 2021-10-30 DIAGNOSIS — E113313 Type 2 diabetes mellitus with moderate nonproliferative diabetic retinopathy with macular edema, bilateral: Secondary | ICD-10-CM

## 2021-10-30 DIAGNOSIS — H2513 Age-related nuclear cataract, bilateral: Secondary | ICD-10-CM

## 2021-11-09 ENCOUNTER — Ambulatory Visit: Payer: Self-pay

## 2021-11-09 ENCOUNTER — Ambulatory Visit: Payer: Medicaid Other | Admitting: Family Medicine

## 2021-11-09 VITALS — BP 136/77 | Ht 62.0 in

## 2021-11-09 DIAGNOSIS — M79641 Pain in right hand: Secondary | ICD-10-CM

## 2021-11-09 DIAGNOSIS — M6530 Trigger finger, unspecified finger: Secondary | ICD-10-CM

## 2021-11-09 MED ORDER — METHYLPREDNISOLONE ACETATE 40 MG/ML IJ SUSP
20.0000 mg | Freq: Once | INTRAMUSCULAR | Status: AC
  Administered 2021-11-09: 20 mg via INTRA_ARTICULAR

## 2021-11-10 ENCOUNTER — Encounter: Payer: Self-pay | Admitting: Family Medicine

## 2021-11-10 NOTE — Progress Notes (Signed)
PCP: Willeen Niece, PA  Subjective:   HPI: Patient is a 46 y.o. female here for right 4th finger trigger finger.  12/22/20: Right 4th digit is catching and locking. Prednisone she was taking for asthma didn't make much of a difference.  11/09/21: Patient reports her right 4th digit pain improved after injection but over past few months has returned. Catching, locking in flexion of this finger. Pain also into 2nd, 3rd, 5th digits. Reports swelling of hand especially in the mornings. Had carpal tunnel release on this hand previously.  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   Cataract    NS OU   Complication of anesthesia    states is hard to wake up post-op   Constipation 12/30/2014   Diabetes mellitus    intolerant to Metformin   Diabetic retinopathy ([place])    NPDR OU   Dry skin    hands    Family history of anesthesia complication    mother went into coma during c-section   Genital herpes 01/01/2006   on acyclovir BID    GERD (gastroesophageal reflux disease)    Herpes genital    Hyperglycemia 12/26/2017   Hypertensive retinopathy    OU   Low back pain 11/22/2012   Lower extremity edema 10/18/2012   Menorrhagia 11/06/2015   Morbid obesity with BMI of 50 to 60 07/23/2009   Obesity hypoventilation syndrome ([place]) 10/30/2012   Obesity, morbid ([place]) 01/10/2014   OSA on CPAP     AHI was on  11-17-12 to 120/hr, titrated to 15 cm with 3 cm EPR/    PCOS (polycystic ovarian syndrome)    Rheumatoid arthritis ([place])    Right ankle pain 06/19/2015   Right hip pain 09/04/2017   Right shoulder pain 06/01/2017   Seasonal allergies    Sleep apnea, obstructive    uses CPAP nightly - had sleep study 08/22/2007    Current Outpatient Medications  on File Prior to Visit  Medication Sig Dispense Refill   acyclovir (ZOVIRAX) 400 MG tablet TAKE 1 TABLET BY MOUTH TWICE A DAY 60 tablet 2   albuterol (PROAIR HFA) 108 (90 Base) MCG/ACT inhaler Inhale 2 puffs into the lungs every 4 (four) hours as needed for wheezing or shortness of breath (cough). (Patient taking differently: Inhale 2 puffs into the lungs every 4 (four) hours as needed for wheezing or shortness of breath (or coughing).) 6.7 g 2   atorvastatin (LIPITOR) 10 MG tablet Take 10 mg by mouth daily.     benzonatate (TESSALON) 200 MG capsule Take 1 capsule (200 mg total) by mouth 3 (three) times daily as needed for cough. 20 capsule 0   Blood Glucose Monitoring Suppl (ACCU-CHEK AVIVA PLUS) w/Device KIT Please check you blood sugars 4 times a day 1 kit 0   CIPRODEX OTIC suspension Place into the right ear.     DERMA-SMOOTHE/FS SCALP 0.01 % OIL Apply topically.     fluconazole (DIFLUCAN) 150 MG tablet TAKE 1 TABLET BY MOUTH NOW. THEN REPEAT IN 1 WEEK     furosemide (LASIX) 20 MG tablet Take 20 mg by mouth daily as needed for fluid or edema.     glucose blood (ACCU-CHEK SMARTVIEW) test strip TEST FOUR TIMES DAILY  150 each 6   HUMULIN R U-500 KWIKPEN 500 UNIT/ML kwikpen Inject into the skin.     hydrochlorothiazide (HYDRODIURIL) 12.5 MG tablet Take by mouth.     Insulin Pen Needle (B-D UF III MINI PEN NEEDLES) 31G X 5 MM MISC USE AS DIRECTED TO INJECT INSULIN FIVE A DAY E11.65 100 each 6   lactulose (CEPHULAC) 10 g packet Take 1 packet (10 g total) by mouth daily as needed. Reported on 07/29/2015 (Patient taking differently: Take 10 g by mouth daily as needed (for constipation).) 30 each 0   Lancets (ACCU-CHEK SOFT TOUCH) lancets Use as instructed 100 each 12   loratadine (CLARITIN) 10 MG tablet Take 1 tablet (10 mg total) by mouth 2 (two) times daily. 180 tablet 1   methocarbamol (ROBAXIN) 500 MG tablet Take 1 tablet (500 mg total) by mouth every 8 (eight) hours as needed. 60 tablet 1    mometasone-formoterol (DULERA) 200-5 MCG/ACT AERO Inhale 2 puffs into the lungs 2 (two) times daily. 13 Inhaler 1   montelukast (SINGULAIR) 10 MG tablet Take 1 tablet (10 mg total) by mouth at bedtime. 90 tablet 1   nitroGLYCERIN (NITRODUR - DOSED IN MG/24 HR) 0.2 mg/hr patch Apply 1/4th patch to affected achilles, change daily 30 patch 1   Olopatadine HCl 0.2 % SOLN Place one drop into both eyes daily.     omeprazole (PRILOSEC) 20 MG capsule Take 1 capsule (20 mg total) by mouth daily before breakfast. 90 capsule 2   SPIRIVA RESPIMAT 1.25 MCG/ACT AERS SMARTSIG:2 Puff(s) Via Inhaler Daily     topiramate (TOPAMAX) 25 MG tablet Take by mouth.     Current Facility-Administered Medications on File Prior to Visit  Medication Dose Route Frequency Provider Last Rate Last Admin   diphenhydrAMINE (BENADRYL) injection 12.5 mg  12.5 mg Intramuscular Once Bernarda Caffey, MD        Past Surgical History:  Procedure Laterality Date   CARPAL TUNNEL RELEASE Right 10/02/2012   Procedure: RIGHT CARPAL TUNNEL RELEASE ENDOSCOPIC;  Surgeon: Jolyn Nap, MD;  Location: [facility];  Service: Orthopedics;  Laterality: Right;   CHOLECYSTECTOMY N/A 12/02/2014   Procedure: LAPAROSCOPIC CHOLECYSTECTOMY;  Surgeon: Armandina Gemma, MD;  Location: [facility];  Service: General;  Laterality: N/A;   HYSTEROSCOPY WITH D & C  01-08-2002    Allergies  Allergen Reactions   Bee Venom Anaphylaxis and Swelling   Wasp Venom Anaphylaxis and Swelling   Citrus Hives and Itching   Latex Hives and Swelling   Penicillins Hives and Itching    Has patient had a PCN reaction causing immediate rash, facial/tongue/throat swelling, SOB or lightheadedness with hypotension: Yes Has patient had a PCN reaction causing severe rash involving mucus membranes or skin necrosis: Unk Has patient had a PCN reaction that required hospitalization: Was already in hosp Has patient had a PCN reaction occurring within the last 10 years: No If  all of the above answers are "NO", then may proceed with Cephalosporin use.    Shellfish Allergy Other (See Comments) and Swelling    Patient is uncertain; stated that she tolerates this (??)   Soap Hives, Itching and Swelling    PUREX LAUNDRY DETERGENT   Tomato Hives and Itching   Banana Itching and Nausea Only   Chocolate Itching   Fluticasone Other (See Comments)    Per patient, makes sneeze and gag   Hydrocodone Itching and Nausea Only   Other Itching    Acidic foods- Itching [place]- Itching  in the roof of the mouth   Adhesive [Tape] Itching and Rash    Allergic to  "leads"   Doxycycline Other (See Comments)    AFFECTS JOINTS   Liraglutide Nausea Only   Metformin And Related Nausea Only, Rash, Other (See Comments) and Diarrhea    GI UPSET, also   Red Dye Itching, Rash and Other (See Comments)    At eye doctor's office, the dilating drops- Skin breaks out, chest turned red, itching, face became swollen    BP 136/77   Ht '5\' 2"'  (1.575 m)   BMI 54.76 kg/m       No data to display              No data to display              Objective:  Physical Exam:  Gen: NAD, comfortable in exam room  Right hand: No deformity, swelling, bruising.  Catching of 4th digit with flexion of PIP joint. TTP over A1 pulley 4th digit. FROM digits, wrist with normal strength. NVI distally. Sensation intact to light touch. Negative finkelstein, tinels carpal tunnel.   Assessment & Plan:  1. Right 4th digit trigger finger - repeated injection today.  Advised if recurs would recommend referral to hand surgeon.  After informed written consent timeout was performed.  Patient was seated on exam table.  Area overlying right 4th digit A1 pulley prepped with alcohol swab then injected with 0.5:0.10m lidocaine: depomedrol.  Patient tolerated procedure well without immediate complications.

## 2021-11-11 ENCOUNTER — Ambulatory Visit: Payer: Medicaid Other | Admitting: Family Medicine

## 2021-11-13 NOTE — Progress Notes (Signed)
[facility] Note  11/27/2021     CHIEF COMPLAINT Patient presents for Retina Follow Up  HISTORY OF PRESENT ILLNESS: Ana Long is a 46 y.o. female who presents to the clinic today for:   HPI     Retina Follow Up   Patient presents with  Diabetic Retinopathy.  In left eye.  This started 5 weeks ago.  I, the attending physician,  performed the HPI with the patient and updated documentation appropriately.        Comments   Pt is reporting stable vision since her last appointment last BS 184 on pred A1C 8.1      Last edited by Bernarda Caffey, MD on 11/27/2021  3:26 PM.    Pt states she has been back on prednisone, she just got off of it yesterday  Referring physician: [name] MD [place], [place] [address]  HISTORICAL INFORMATION:   Selected notes from the [facility] Re-referred by Dr. [name] for DM exam   CURRENT MEDICATIONS: Current Outpatient Medications (Ophthalmic Drugs)  Medication Sig   Olopatadine HCl 0.2 % SOLN Place one drop into both eyes daily.   No current facility-administered medications for this visit. (Ophthalmic Drugs)   Current Outpatient Medications (Other)  Medication Sig   acyclovir (ZOVIRAX) 400 MG tablet TAKE 1 TABLET BY MOUTH TWICE A DAY   albuterol (PROAIR HFA) 108 (90 Base) MCG/ACT inhaler Inhale 2 puffs into the lungs every 4 (four) hours as needed for wheezing or shortness of breath (cough). (Patient taking differently: Inhale 2 puffs into the lungs every 4 (four) hours as needed for wheezing or shortness of breath (or coughing).)   atorvastatin (LIPITOR) 10 MG tablet Take 10 mg by mouth daily.   benzonatate (TESSALON) 200 MG capsule Take 1 capsule (200 mg total) by mouth 3 (three) times daily as needed for cough.   Blood Glucose Monitoring Suppl (ACCU-CHEK AVIVA PLUS) w/Device KIT Please check you blood sugars 4 times a day   CIPRODEX OTIC suspension Place into the  right ear.   DERMA-SMOOTHE/FS SCALP 0.01 % OIL Apply topically.   fluconazole (DIFLUCAN) 150 MG tablet TAKE 1 TABLET BY MOUTH NOW. THEN REPEAT IN 1 WEEK   furosemide (LASIX) 20 MG tablet Take 20 mg by mouth daily as needed for fluid or edema.   glucose blood (ACCU-CHEK SMARTVIEW) test strip TEST FOUR TIMES DAILY   HUMULIN R U-500 KWIKPEN 500 UNIT/ML kwikpen Inject into the skin.   hydrochlorothiazide (HYDRODIURIL) 12.5 MG tablet Take by mouth.   Insulin Pen Needle (B-D UF III MINI PEN NEEDLES) 31G X 5 MM MISC USE AS DIRECTED TO INJECT INSULIN FIVE A DAY E11.65   lactulose (CEPHULAC) 10 g packet Take 1 packet (10 g total) by mouth daily as needed. Reported on 07/29/2015 (Patient taking differently: Take 10 g by mouth daily as needed (for constipation).)   Lancets (ACCU-CHEK SOFT TOUCH) lancets Use as instructed   loratadine (CLARITIN) 10 MG tablet Take 1 tablet (10 mg total) by mouth 2 (two) times daily.   methocarbamol (ROBAXIN) 500 MG tablet Take 1 tablet (500 mg total) by mouth every 8 (eight) hours as needed.   mometasone-formoterol (DULERA) 200-5 MCG/ACT AERO Inhale 2 puffs into the lungs 2 (two) times daily.   montelukast (SINGULAIR) 10 MG tablet Take 1 tablet (10 mg total) by mouth at bedtime.   nitroGLYCERIN (NITRODUR - DOSED IN MG/24 HR) 0.2 mg/hr patch Apply 1/4th  patch to affected achilles, change daily   omeprazole (PRILOSEC) 20 MG capsule Take 1 capsule (20 mg total) by mouth daily before breakfast.   SPIRIVA RESPIMAT 1.25 MCG/ACT AERS SMARTSIG:2 Puff(s) Via Inhaler Daily   topiramate (TOPAMAX) 25 MG tablet Take by mouth.   Current Facility-Administered Medications (Other)  Medication Route   diphenhydrAMINE (BENADRYL) injection 12.5 mg Intramuscular   REVIEW OF SYSTEMS: ROS   Positive for: Endocrine, Eyes, Respiratory Last edited by Bernarda Caffey, MD on 11/28/2021  1:46 AM.      ALLERGIES Allergies  Allergen Reactions   Bee Venom Anaphylaxis and Swelling   Wasp Venom  Anaphylaxis and Swelling   Citrus Hives and Itching   Latex Hives and Swelling   Penicillins Hives and Itching    Has patient had a PCN reaction causing immediate rash, facial/tongue/throat swelling, SOB or lightheadedness with hypotension: Yes Has patient had a PCN reaction causing severe rash involving mucus membranes or skin necrosis: Unk Has patient had a PCN reaction that required hospitalization: Was already in hosp Has patient had a PCN reaction occurring within the last 10 years: No If all of the above answers are "NO", then may proceed with Cephalosporin use.    Shellfish Allergy Other (See Comments) and Swelling    Patient is uncertain; stated that she tolerates this (??)   Soap Hives, Itching and Swelling    PUREX LAUNDRY DETERGENT   Tomato Hives and Itching   Banana Itching and Nausea Only   Chocolate Itching   Fluticasone Other (See Comments)    Per patient, makes sneeze and gag   Hydrocodone Itching and Nausea Only   Other Itching    Acidic foods- Itching [place]- Itching in the roof of the mouth   Adhesive [Tape] Itching and Rash    Allergic to  "leads"   Doxycycline Other (See Comments)    AFFECTS JOINTS   Liraglutide Nausea Only   Metformin And Related Nausea Only, Rash, Other (See Comments) and Diarrhea    GI UPSET, also   Red Dye Itching, Rash and Other (See Comments)    At eye doctor's office, the dilating drops- Skin breaks out, chest turned red, itching, face became swollen   PAST MEDICAL HISTORY 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   Cataract    NS OU   Complication of anesthesia    states is hard to wake up post-op   Constipation 12/30/2014   Diabetes mellitus    intolerant to Metformin   Diabetic  retinopathy ([place])    NPDR OU   Dry skin    hands    Family history of anesthesia complication    mother went into coma during c-section   Genital herpes 01/01/2006   on acyclovir BID    GERD (gastroesophageal reflux disease)    Herpes genital    Hyperglycemia 12/26/2017   Hypertensive retinopathy    OU   Low back pain 11/22/2012   Lower extremity edema 10/18/2012   Menorrhagia 11/06/2015   Morbid obesity with BMI of 50 to 60 07/23/2009   Obesity hypoventilation syndrome ([place]) 10/30/2012   Obesity, morbid ([place]) 01/10/2014   OSA on CPAP     AHI was on  11-17-12 to 120/hr, titrated  to 15 cm with 3 cm EPR/    PCOS (polycystic ovarian syndrome)    Rheumatoid arthritis (HCC)    Right ankle pain 06/19/2015   Right hip pain 09/04/2017   Right shoulder pain 06/01/2017   Seasonal allergies    Sleep apnea, obstructive    uses CPAP nightly - had sleep study 08/22/2007   Past Surgical History:  Procedure Laterality Date   CARPAL TUNNEL RELEASE Right 10/02/2012   Procedure: RIGHT CARPAL TUNNEL RELEASE ENDOSCOPIC;  Surgeon: Jolyn Nap, MD;  Location: [facility];  Service: Orthopedics;  Laterality: Right;   CHOLECYSTECTOMY N/A 12/02/2014   Procedure: LAPAROSCOPIC CHOLECYSTECTOMY;  Surgeon: Armandina Gemma, MD;  Location: [facility];  Service: General;  Laterality: N/A;   HYSTEROSCOPY WITH D & C  01-08-2002   FAMILY HISTORY Family History  Problem Relation Age of Onset   Other Mother    Diabetes Maternal Aunt    Heart attack Maternal Aunt    Alzheimer's disease Maternal Uncle    Cancer Maternal Uncle        prostate   Diabetes Paternal Aunt    Cancer Paternal Aunt        brain,mouth   Alzheimer's disease Paternal Aunt    Colon polyps Paternal Uncle    Diabetes Paternal Uncle    Diabetes Maternal Grandmother    Diabetes Maternal Grandfather    Leukemia Cousin    Other Other    Obesity Other    Other Other    Colitis Neg Hx    SOCIAL HISTORY Social History   Tobacco Use    Smoking status: Former    Years: 10.00    Types: Cigarettes    Quit date: 05/24/2010    Years since quitting: 11.5   Smokeless tobacco: Never  Vaping Use   Vaping Use: Never used  Substance Use Topics   Alcohol use: Yes    Alcohol/week: 0.0 standard drinks of alcohol    Comment: socially   Drug use: No       OPHTHALMIC EXAM: Base Eye Exam     Visual Acuity (Snellen - Linear)       Right Left   Dist cc 20/40 -2 20/40 -1   Dist ph cc 20/30 +2 20/25 -2    Correction: Glasses         Tonometry (Applanation, 1:21 PM)       Right Left   Pressure 18 16         Pupils       Dark Light Shape React APD   Right 4 3 Round Sluggish None   Left 4 3 Round Sluggish None         Visual Fields       Left Right    Full Full         Extraocular Movement       Right Left    Full Full         Neuro/Psych     Oriented x3: Yes   Mood/Affect: Normal         Dilation     Both eyes: 1.0% Mydriacyl, 2.5% Phenylephrine @ 1:22 PM           Slit Lamp and Fundus Exam     Slit Lamp Exam       Right Left   Lids/Lashes Dermatochalasis - upper lid, mild Meibomian gland dysfunction Dermatochalasis - upper lid, mild Meibomian gland dysfunction   Conjunctiva/Sclera White and quiet White and  quiet   Cornea 2+Punctate epithelial erosions, mild tear film debris 3+fine Punctate epithelial erosions   Anterior Chamber deep and quiet deep and quiet   Iris round and dilated, No NVI round and dilated   Lens 1+ NS, 1+CC 1+NS, 1+CC   Anterior Vitreous mild vitreous syneresis mild vitreous syneresis, scattered vitreous condensations         Fundus Exam       Right Left   Disc Pink and sharp, +cupping Pink and sharp, +cupping   C/D Ratio 0.7 0.6   Macula good foveal reflex, focal exudates ST macula, scattered MA, focal cystic changes ST macula - slightly improved, focal laser changes inferior macula good foveal reflex, scatttered MA's and exudate, central cystic changes  -- persistent, focal cluster of exudates SN macula -  persistent   Vessels attenuated, Tortuous attenuated, Tortuous   Periphery Attached, scattered MA/exudates greatest nasally Attached, scattered MA and exudate greatest superior to disc, white without pressure temporally           Refraction     Wearing Rx       Sphere Cylinder Axis   Right -2.50 +1.50 013   Left -2.25 Sphere     Type: SVL           IMAGING AND PROCEDURES  Imaging and Procedures for _0 @  OCT, Retina - OU - Both Eyes       Right Eye Quality was good. Central Foveal Thickness: 224. Progression has improved. Findings include normal foveal contour, no SRF, intraretinal hyper-reflective material, intraretinal fluid, vitreomacular adhesion (Mild Interval improvement in IRF/IRHM ST macula ).   Left Eye Quality was good. Central Foveal Thickness: 299. Progression has worsened. Findings include no SRF, abnormal foveal contour, intraretinal hyper-reflective material, intraretinal fluid (persistent IRF SN macula and fovea -- slightly increased).   Notes *Images captured and stored on drive  Diagnosis / Impression:  +DME OU OD: mild Interval improvement in IRF/IRHM ST macula  OS: persistent IRF SN macula and fovea -- slightly increased  Clinical management:  See below  Abbreviations: NFP - Normal foveal profile. CME - cystoid macular edema. PED - pigment epithelial detachment. IRF - intraretinal fluid. SRF - subretinal fluid. EZ - ellipsoid zone. ERM - epiretinal membrane. ORA - outer retinal atrophy. ORT - outer retinal tubulation. SRHM - subretinal hyper-reflective material      Intravitreal Injection, Pharmacologic Agent - OD - Right Eye       Time Out 11/27/2021. 1:40 PM. Confirmed correct patient, procedure, site, and patient consented.   Anesthesia Topical anesthesia was used. Anesthetic medications included Lidocaine 2%, Proparacaine 0.5%.   Procedure Preparation included 5% betadine to  ocular surface, eyelid speculum. A supplied (32g) needle was used.   Injection: 1.25 mg Bevacizumab 1.26m/0.05ml   Route: Intravitreal, Site: Right Eye   NDC:: 28366-294-76 Lot: [id number] , Expiration date: 01/03/2022   Post-op Post injection exam found visual acuity of at least counting fingers. The patient tolerated the procedure well. There were no complications. The patient received written and verbal post procedure care education. Post injection medications were not given.      Intravitreal Injection, Pharmacologic Agent - OS - Left Eye       Time Out 11/27/2021. 1:41 PM. Confirmed correct patient, procedure, site, and patient consented.   Anesthesia Topical anesthesia was used. Anesthetic medications included Lidocaine 2%, Proparacaine 0.5%.   Procedure Preparation included 5% betadine to ocular surface, eyelid speculum. A (32g) needle was used.   Injection: 1.25 mg Bevacizumab  1.37m/0.05ml   Route: Intravitreal, Site: Left Eye   NDC: [id number] Lot:: [id number] Expiration date: 01/06/2022   Post-op Post injection exam found visual acuity of at least counting fingers. The patient tolerated the procedure well. There were no complications. The patient received written and verbal post procedure care education. Post injection medications were not given.            ASSESSMENT/PLAN:    ICD-10-CM   1. Moderate nonproliferative diabetic retinopathy of both eyes with macular edema associated with type 2 diabetes mellitus (HCC)  E11.3313 OCT, Retina - OU - Both Eyes    Intravitreal Injection, Pharmacologic Agent - OD - Right Eye    Intravitreal Injection, Pharmacologic Agent - OS - Left Eye    Bevacizumab (AVASTIN) SOLN 1.25 mg    Bevacizumab (AVASTIN) SOLN 1.25 mg    2. Essential hypertension  I10     3. Hypertensive retinopathy of both eyes  H35.033     4. Nuclear sclerosis of both eyes  H25.13      1.  Moderate non-proliferative diabetic retinopathy w/ DME,  OU  - pt lost to f/u from 10.23.2019 to 11.18.20  - FA in 2019 showed leaking MA OU; no NV OU  **of note, pt had pruritic rxn to fluorescein dye -- resolved with 12.5 mg IV benadryl**  - FA 12.16.2020 shows late leaking MA's, non-perfusion defects; no NV OU  - s/p IVA OD #1 (12.16.20), #2 (01.25.21), #3 (05.26.21), #4 (07.02.21), #5 (10.15.21), #6 (11.23.21), #7 (07.27.22), #8 (09.09.22), #9 (11.11.22), #10 (01.20.23), #11 (02.23.23), #12 (04.09.23), #13 (05.23.23), #14 (06.30.23), #15 (08.04.23), #16 (09.01.23)  - s/p focal laser OD (03.19.21), (04.01.22)  - s/p IVA OS #1 (12.23.20), #2 (01.27.21), #3 (03.02.21), #4 (05.26.21), #5 (07.02.21), #6 (10.15.21), #7 (11.23.21), #8 (01.11.22), #9 (03.04.22), #10 (04.27.22), #11 (11.14.22), #12 (01.10.23), #13 (02.20.23), #14 (04.05.23), #15 (05.19.23), #16 (06.23.23), #17 (07.28.23), #18 (09.01.23)  - exam shows scattered MA, IRH, exudates and mild macular edema OU  - BCVA 20/30 OD, 20/25 OS -- both decreased (from corneal dryness)  - OCT shows OD: mild Interval improvement in IRF/IRHM ST macula; OS: persistent IRF SN macula and fovea -- slightly increased             - Recommend IVA OU (OD #17 and OS #19) today, 10.06.23  - pt wishes to proceed with injections - RBA of procedure discussed, questions answered - see procedure note  - Avastin informed consent form re-signed and scanned on 05.19.2023  - f/u 5 wks -- DFE, OCT, possible injection(s)  2,3. Hypertensive retinopathy OU  - discussed importance of tight BP control  - monitor   4. Nuclear Sclerosis   - The symptoms of cataract, surgical options, and treatments and risks were discussed with patient.   - discussed diagnosis and progression  - under the expert management of [facility]  5. Dry eyes OU  - likely cause of decreased vision OU today - recommend artificial tears and lubricating ointment as needed   Ophthalmic Meds Ordered this visit:  Meds ordered this encounter   Medications   Bevacizumab (AVASTIN) SOLN 1.25 mg   Bevacizumab (AVASTIN) SOLN 1.25 mg     Return in about 5 weeks (around 01/01/2022) for f/u NPDR OU, DFE, OCT.  There are no Patient Instructions on file for this visit.  Explained the diagnoses, plan, and follow up with the patient and they expressed understanding.  Patient expressed understanding of the importance of proper follow up  care.  This document serves as a record of services personally performed by Gardiner Sleeper, MD, PhD. It was created on their behalf by Renaldo Reel, [place] an ophthalmic technician. The creation of this record is the provider's dictation and/or activities during the visit.    Electronically signed by:  Renaldo Reel, COT  09.22.23 1:48 AM  This document serves as a record of services personally performed by Gardiner Sleeper, MD, PhD. It was created on their behalf by San Jetty. Owens Shark, OA an ophthalmic technician. The creation of this record is the provider's dictation and/or activities during the visit.    Electronically signed by: San Jetty. [place], [place] 10.06.23 1:48 AM   Gardiner Sleeper, M.D., Ph.D. Diseases & Surgery of the Retina and Vitreous [facility]  I have reviewed the above documentation for accuracy and completeness, and I agree with the above. Gardiner Sleeper, M.D., Ph.D. 11/28/21 1:48 AM   Abbreviations: M myopia (nearsighted); A astigmatism; H hyperopia (farsighted); P presbyopia; Mrx spectacle prescription;  CTL contact lenses; OD right eye; OS left eye; OU both eyes  XT exotropia; ET esotropia; PEK punctate epithelial keratitis; PEE punctate epithelial erosions; DES dry eye syndrome; MGD meibomian gland dysfunction; ATs artificial tears; PFAT's preservative free artificial tears; [place] nuclear sclerotic cataract; PSC posterior subcapsular cataract; ERM epi-retinal membrane; PVD posterior vitreous detachment; RD retinal detachment; DM diabetes mellitus; DR diabetic  retinopathy; NPDR non-proliferative diabetic retinopathy; PDR proliferative diabetic retinopathy; CSME clinically significant macular edema; DME diabetic macular edema; dbh dot blot hemorrhages; CWS cotton wool spot; POAG primary open angle glaucoma; C/D cup-to-disc ratio; HVF humphrey visual field; GVF goldmann visual field; OCT optical coherence tomography; IOP intraocular pressure; BRVO Branch retinal vein occlusion; CRVO central retinal vein occlusion; CRAO central retinal artery occlusion; BRAO branch retinal artery occlusion; RT retinal tear; SB scleral buckle; PPV pars plana vitrectomy; VH Vitreous hemorrhage; PRP panretinal laser photocoagulation; IVK intravitreal kenalog; VMT vitreomacular traction; MH Macular hole;  NVD neovascularization of the disc; NVE neovascularization elsewhere; AREDS age related eye disease study; ARMD age related macular degeneration; POAG primary open angle glaucoma; EBMD epithelial/anterior basement membrane dystrophy; ACIOL anterior chamber intraocular lens; IOL intraocular lens; PCIOL posterior chamber intraocular lens; Phaco/IOL phacoemulsification with intraocular lens placement; [place] photorefractive keratectomy; LASIK laser assisted in situ keratomileusis; HTN hypertension; DM diabetes mellitus; COPD chronic obstructive pulmonary disease

## 2021-11-27 ENCOUNTER — Ambulatory Visit (INDEPENDENT_AMBULATORY_CARE_PROVIDER_SITE_OTHER): Payer: Medicaid Other | Admitting: Ophthalmology

## 2021-11-27 ENCOUNTER — Encounter (INDEPENDENT_AMBULATORY_CARE_PROVIDER_SITE_OTHER): Payer: Self-pay | Admitting: Ophthalmology

## 2021-11-27 DIAGNOSIS — E113313 Type 2 diabetes mellitus with moderate nonproliferative diabetic retinopathy with macular edema, bilateral: Secondary | ICD-10-CM | POA: Diagnosis not present

## 2021-11-27 DIAGNOSIS — I10 Essential (primary) hypertension: Secondary | ICD-10-CM | POA: Diagnosis not present

## 2021-11-27 DIAGNOSIS — H35033 Hypertensive retinopathy, bilateral: Secondary | ICD-10-CM | POA: Diagnosis not present

## 2021-11-27 DIAGNOSIS — H2513 Age-related nuclear cataract, bilateral: Secondary | ICD-10-CM | POA: Diagnosis not present

## 2021-11-27 MED ORDER — BEVACIZUMAB CHEMO INJECTION 1.25MG/0.05ML SYRINGE FOR KALEIDOSCOPE
1.2500 mg | INTRAVITREAL | Status: AC | PRN
  Administered 2021-11-27: 1.25 mg via INTRAVITREAL

## 2022-01-01 ENCOUNTER — Encounter (INDEPENDENT_AMBULATORY_CARE_PROVIDER_SITE_OTHER): Payer: Self-pay | Admitting: Ophthalmology

## 2022-01-01 ENCOUNTER — Ambulatory Visit (INDEPENDENT_AMBULATORY_CARE_PROVIDER_SITE_OTHER): Payer: Medicaid Other | Admitting: Ophthalmology

## 2022-01-01 DIAGNOSIS — E113313 Type 2 diabetes mellitus with moderate nonproliferative diabetic retinopathy with macular edema, bilateral: Secondary | ICD-10-CM

## 2022-01-01 DIAGNOSIS — H04123 Dry eye syndrome of bilateral lacrimal glands: Secondary | ICD-10-CM

## 2022-01-01 DIAGNOSIS — I10 Essential (primary) hypertension: Secondary | ICD-10-CM

## 2022-01-01 DIAGNOSIS — H35033 Hypertensive retinopathy, bilateral: Secondary | ICD-10-CM

## 2022-01-01 DIAGNOSIS — H2513 Age-related nuclear cataract, bilateral: Secondary | ICD-10-CM | POA: Diagnosis not present

## 2022-01-01 MED ORDER — BEVACIZUMAB CHEMO INJECTION 1.25MG/0.05ML SYRINGE FOR KALEIDOSCOPE
1.2500 mg | INTRAVITREAL | Status: AC | PRN
  Administered 2022-01-01: 1.25 mg via INTRAVITREAL

## 2022-01-01 NOTE — Progress Notes (Addendum)
[facility] Note  01/01/2022     CHIEF COMPLAINT Patient presents for Retina Follow Up  HISTORY OF PRESENT ILLNESS: Ana Long is a 46 y.o. female who presents to the clinic today for:   HPI     Retina Follow Up   Patient presents with  Diabetic Retinopathy.  Severity is moderate.  Duration of 5 weeks.  Since onset it is gradually worsening.  I, the attending physician,  performed the HPI with the patient and updated documentation appropriately.        Comments   5 week [facility] eval patient states vision seems worse. Blood sugar 157      Last edited by Bernarda Caffey, MD on 01/01/2022  3:57 PM.     Pt states she has been off prednisone for about a month  Referring physician: [name] MD [place],  [id number]  HISTORICAL INFORMATION:   Selected notes from the [facility] Re-referred by Dr. [name] for DM exam   CURRENT MEDICATIONS: Current Outpatient Medications (Ophthalmic Drugs)  Medication Sig   Olopatadine HCl 0.2 % SOLN Place one drop into both eyes daily.   No current facility-administered medications for this visit. (Ophthalmic Drugs)   Current Outpatient Medications (Other)  Medication Sig   acyclovir (ZOVIRAX) 400 MG tablet TAKE 1 TABLET BY MOUTH TWICE A DAY   albuterol (PROAIR HFA) 108 (90 Base) MCG/ACT inhaler Inhale 2 puffs into the lungs every 4 (four) hours as needed for wheezing or shortness of breath (cough). (Patient taking differently: Inhale 2 puffs into the lungs every 4 (four) hours as needed for wheezing or shortness of breath (or coughing).)   atorvastatin (LIPITOR) 10 MG tablet Take 10 mg by mouth daily.   benzonatate (TESSALON) 200 MG capsule Take 1 capsule (200 mg total) by mouth 3 (three) times daily as needed for cough.   Blood Glucose Monitoring Suppl (ACCU-CHEK AVIVA PLUS) w/Device KIT Please check you blood sugars 4 times a day   CIPRODEX OTIC suspension Place  into the right ear.   DERMA-SMOOTHE/FS SCALP 0.01 % OIL Apply topically.   fluconazole (DIFLUCAN) 150 MG tablet TAKE 1 TABLET BY MOUTH NOW. THEN REPEAT IN 1 WEEK   furosemide (LASIX) 20 MG tablet Take 20 mg by mouth daily as needed for fluid or edema.   glucose blood (ACCU-CHEK SMARTVIEW) test strip TEST FOUR TIMES DAILY   HUMULIN R U-500 KWIKPEN 500 UNIT/ML kwikpen Inject into the skin.   hydrochlorothiazide (HYDRODIURIL) 12.5 MG tablet Take by mouth.   Insulin Pen Needle (B-D UF III MINI PEN NEEDLES) 31G X 5 MM MISC USE AS DIRECTED TO INJECT INSULIN FIVE A DAY E11.65   lactulose (CEPHULAC) 10 g packet Take 1 packet (10 g total) by mouth daily as needed. Reported on 07/29/2015 (Patient taking differently: Take 10 g by mouth daily as needed (for constipation).)   Lancets (ACCU-CHEK SOFT TOUCH) lancets Use as instructed   loratadine (CLARITIN) 10 MG tablet Take 1 tablet (10 mg total) by mouth 2 (two) times daily.   methocarbamol (ROBAXIN) 500 MG tablet Take 1 tablet (500 mg total) by mouth every 8 (eight) hours as needed.   mometasone-formoterol (DULERA) 200-5 MCG/ACT AERO Inhale 2 puffs into the lungs 2 (two) times daily.   montelukast (SINGULAIR) 10 MG tablet Take 1 tablet (10 mg total) by mouth at bedtime.   nitroGLYCERIN (NITRODUR - DOSED IN MG/24 HR) 0.2 mg/hr patch Apply 1/4th patch  to affected achilles, change daily   omeprazole (PRILOSEC) 20 MG capsule Take 1 capsule (20 mg total) by mouth daily before breakfast.   SPIRIVA RESPIMAT 1.25 MCG/ACT AERS SMARTSIG:2 Puff(s) Via Inhaler Daily   topiramate (TOPAMAX) 25 MG tablet Take by mouth.   Current Facility-Administered Medications (Other)  Medication Route   diphenhydrAMINE (BENADRYL) injection 12.5 mg Intramuscular   REVIEW OF SYSTEMS: ROS   Positive for: Endocrine, Eyes, Respiratory Last edited by Elmore Guise, COT on 01/01/2022  1:44 PM.     ALLERGIES Allergies  Allergen Reactions   Bee Venom Anaphylaxis and Swelling   Wasp  Venom Anaphylaxis and Swelling   Citrus Hives and Itching   Latex Hives and Swelling   Penicillins Hives and Itching    Has patient had a PCN reaction causing immediate rash, facial/tongue/throat swelling, SOB or lightheadedness with hypotension: Yes Has patient had a PCN reaction causing severe rash involving mucus membranes or skin necrosis: Unk Has patient had a PCN reaction that required hospitalization: Was already in hosp Has patient had a PCN reaction occurring within the last 10 years: No If all of the above answers are "NO", then may proceed with Cephalosporin use.    Shellfish Allergy Other (See Comments) and Swelling    Patient is uncertain; stated that she tolerates this (??)   Soap Hives, Itching and Swelling    PUREX LAUNDRY DETERGENT   Tomato Hives and Itching   Banana Itching and Nausea Only   Chocolate Itching   Fluticasone Other (See Comments)    Per patient, makes sneeze and gag   Hydrocodone Itching and Nausea Only   Other Itching    Acidic foods- Itching [place]- Itching in the roof of the mouth   Adhesive [Tape] Itching and Rash    Allergic to  "leads"   Doxycycline Other (See Comments)    AFFECTS JOINTS   Liraglutide Nausea Only   Metformin And Related Nausea Only, Rash, Other (See Comments) and Diarrhea    GI UPSET, also   Red Dye Itching, Rash and Other (See Comments)    At eye doctor's office, the dilating drops- Skin breaks out, chest turned red, itching, face became swollen   PAST MEDICAL HISTORY 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   Cataract    NS OU   Complication of anesthesia    states is hard to wake up post-op   Constipation 12/30/2014   Diabetes mellitus    intolerant to Metformin    Diabetic retinopathy ([place])    NPDR OU   Dry skin    hands    Family history of anesthesia complication    mother went into coma during c-section   Genital herpes 01/01/2006   on acyclovir BID    GERD (gastroesophageal reflux disease)    Herpes genital    Hyperglycemia 12/26/2017   Hypertensive retinopathy    OU   Low back pain 11/22/2012   Lower extremity edema 10/18/2012   Menorrhagia 11/06/2015   Morbid obesity with BMI of 50 to 60 07/23/2009   Obesity hypoventilation syndrome ([place]) 10/30/2012   Obesity, morbid ([place]) 01/10/2014   OSA on CPAP     AHI was on  11-17-12 to 120/hr, titrated to  15 cm with 3 cm EPR/    PCOS (polycystic ovarian syndrome)    Rheumatoid arthritis (HCC)    Right ankle pain 06/19/2015   Right hip pain 09/04/2017   Right shoulder pain 06/01/2017   Seasonal allergies    Sleep apnea, obstructive    uses CPAP nightly - had sleep study 08/22/2007   Past Surgical History:  Procedure Laterality Date   CARPAL TUNNEL RELEASE Right 10/02/2012   Procedure: RIGHT CARPAL TUNNEL RELEASE ENDOSCOPIC;  Long: Jolyn Nap, MD;  Location: [facility];  Service: Orthopedics;  Laterality: Right;   CHOLECYSTECTOMY N/A 12/02/2014   Procedure: LAPAROSCOPIC CHOLECYSTECTOMY;  Long: Armandina Gemma, MD;  Location: [facility];  Service: General;  Laterality: N/A;   HYSTEROSCOPY WITH D & C  01-08-2002   FAMILY HISTORY Family History  Problem Relation Age of Onset   Other Mother    Diabetes Maternal Aunt    Heart attack Maternal Aunt    Alzheimer's disease Maternal Uncle    Cancer Maternal Uncle        prostate   Diabetes Paternal Aunt    Cancer Paternal Aunt        brain,mouth   Alzheimer's disease Paternal Aunt    Colon polyps Paternal Uncle    Diabetes Paternal Uncle    Diabetes Maternal Grandmother    Diabetes Maternal Grandfather    Leukemia Cousin    Other Other    Obesity Other    Other Other    Colitis Neg Hx    SOCIAL HISTORY Social History    Tobacco Use   Smoking status: Former    Years: 10.00    Types: Cigarettes    Quit date: 05/24/2010    Years since quitting: 11.6   Smokeless tobacco: Never  Vaping Use   Vaping Use: Never used  Substance Use Topics   Alcohol use: Yes    Alcohol/week: 0.0 standard drinks of alcohol    Comment: socially   Drug use: No       OPHTHALMIC EXAM: Base Eye Exam     Visual Acuity (Snellen - Linear)       Right Left   Dist cc 20/40-1 20/50-2   Dist ph cc 20/25+1 20/25+1    Correction: Glasses         Tonometry (Tonopen, 1:46 PM)       Right Left   Pressure 20 22         Pupils       Dark Light Shape React APD   Right 3 2 Round Minimal None   Left 3 2 Round Minimal None         Visual Fields (Counting fingers)       Left Right    Full Full         Extraocular Movement       Right Left    Full, Ortho Full, Ortho         Neuro/Psych     Oriented x3: Yes   Mood/Affect: Normal         Dilation     Both eyes: 1.0% Mydriacyl, 2.5% Phenylephrine @ 1:46 PM           Slit Lamp and Fundus Exam     Slit Lamp Exam       Right Left   Lids/Lashes Dermatochalasis - upper lid, mild Meibomian gland dysfunction Dermatochalasis - upper lid, mild Meibomian gland dysfunction   Conjunctiva/Sclera White and quiet White and quiet  Cornea 2+Punctate epithelial erosions, mild tear film debris 3+fine Punctate epithelial erosions   Anterior Chamber deep and quiet deep and quiet   Iris round and dilated, No NVI round and dilated   Lens 1+ NS, 1+CC 1+NS, 1+CC   Anterior Vitreous mild vitreous syneresis mild vitreous syneresis, scattered vitreous condensations         Fundus Exam       Right Left   Disc Pink and sharp, +cupping Pink and sharp, +cupping   C/D Ratio 0.7 0.6   Macula good foveal reflex, focal exudates ST macula, scattered MA, focal cystic changes ST macula - slightly improved, focal laser changes inferior macula good foveal reflex,  scatttered MA's and exudate, central cystic changes -- persistent, focal cluster of exudates SN macula -  persistent   Vessels attenuated, Tortuous attenuated, Tortuous   Periphery Attached, scattered MA/exudates greatest nasally Attached, scattered MA and exudate greatest superior to disc, white without pressure temporally           Refraction     Wearing Rx       Sphere Cylinder Axis   Right -2.50 +1.50 013   Left -2.25 Sphere     Type: SVL           IMAGING AND PROCEDURES  Imaging and Procedures for _0 @  OCT, Retina - OU - Both Eyes       Right Eye Quality was good. Central Foveal Thickness: 222. Progression has improved. Findings include normal foveal contour, no SRF, intraretinal hyper-reflective material, intraretinal fluid, vitreomacular adhesion (Persistent IRF/IRHM temporal macula -- slightly improved ST quad).   Left Eye Quality was good. Central Foveal Thickness: 296. Progression has improved. Findings include no SRF, abnormal foveal contour, intraretinal hyper-reflective material, intraretinal fluid (persistent IRF SN macula and fovea -- slightly improved).   Notes *Images captured and stored on drive  Diagnosis / Impression:  +DME OU OD: mild Interval improvement in IRF/IRHM ST macula -- slightly improved OS: persistent IRF SN macula and fovea -- slightly improved  Clinical management:  See below  Abbreviations: NFP - Normal foveal profile. CME - cystoid macular edema. PED - pigment epithelial detachment. IRF - intraretinal fluid. SRF - subretinal fluid. EZ - ellipsoid zone. ERM - epiretinal membrane. ORA - outer retinal atrophy. ORT - outer retinal tubulation. SRHM - subretinal hyper-reflective material      Intravitreal Injection, Pharmacologic Agent - OD - Right Eye       Time Out 01/01/2022. 2:19 PM. Confirmed correct patient, procedure, site, and patient consented.   Anesthesia Topical anesthesia was used. Anesthetic medications  included Lidocaine 2%, Proparacaine 0.5%.   Procedure Preparation included 5% betadine to ocular surface, eyelid speculum. A (32g) needle was used.   Injection: 1.25 mg Bevacizumab 1.27m/0.05ml   Route: Intravitreal, Site: Right Eye   NDC: 50242-060-01, Lot:: [id number] Expiration date: 01/20/2022   Post-op Post injection exam found visual acuity of at least counting fingers. The patient tolerated the procedure well. There were no complications. The patient received written and verbal post procedure care education. Post injection medications were not given.      Intravitreal Injection, Pharmacologic Agent - OS - Left Eye       Time Out 01/01/2022. 2:19 PM. Confirmed correct patient, procedure, site, and patient consented.   Anesthesia Topical anesthesia was used. Anesthetic medications included Lidocaine 2%, Proparacaine 0.5%.   Procedure Preparation included 5% betadine to ocular surface, eyelid speculum. A supplied (32g) needle was used.   Injection: 1.25 mg Bevacizumab  1.43m/0.05ml   Route: Intravitreal, Site: Left Eye   NDC: 5H061816 Lot:: [id number] Expiration date: 02/15/2022   Post-op Post injection exam found visual acuity of at least counting fingers. The patient tolerated the procedure well. There were no complications. The patient received written and verbal post procedure care education. Post injection medications were not given.            ASSESSMENT/PLAN:    ICD-10-CM   1. Moderate nonproliferative diabetic retinopathy of both eyes with macular edema associated with type 2 diabetes mellitus (HCC)  E11.3313 OCT, Retina - OU - Both Eyes    Intravitreal Injection, Pharmacologic Agent - OD - Right Eye    Intravitreal Injection, Pharmacologic Agent - OS - Left Eye    Bevacizumab (AVASTIN) SOLN 1.25 mg    Bevacizumab (AVASTIN) SOLN 1.25 mg    2. Essential hypertension  I10     3. Hypertensive retinopathy of both eyes  H35.033     4. Nuclear sclerosis of  both eyes  H25.13     5. Dry eyes  H04.123      1.  Moderate non-proliferative diabetic retinopathy w/ DME, OU  - pt lost to f/u from 10.23.2019 to 11.18.20  - FA in 2019 showed leaking MA OU; no NV OU  **of note, pt had pruritic rxn to fluorescein dye -- resolved with 12.5 mg IV benadryl**  - FA 12.16.2020 shows late leaking MA's, non-perfusion defects; no NV OU  - s/p IVA OD #1 (12.16.20), #2 (01.25.21), #3 (05.26.21), #4 (07.02.21), #5 (10.15.21), #6 (11.23.21), #7 (07.27.22), #8 (09.09.22), #9 (11.11.22), #10 (01.20.23), #11 (02.23.23), #12 (04.09.23), #13 (05.23.23), #14 (06.30.23), #15 (08.04.23), #16 (09.01.23), #17 (10.06.23)  - s/p focal laser OD (03.19.21), (04.01.22)  - s/p IVA OS #1 (12.23.20), #2 (01.27.21), #3 (03.02.21), #4 (05.26.21), #5 (07.02.21), #6 (10.15.21), #7 (11.23.21), #8 (01.11.22), #9 (03.04.22), #10 (04.27.22), #11 (11.14.22), #12 (01.10.23), #13 (02.20.23), #14 (04.05.23), #15 (05.19.23), #16 (06.23.23), #17 (07.28.23), #18 (09.01.23), #19 (10.06.23)  - exam shows scattered MA, IRH, exudates and mild macular edema OU  - BCVA 20/25 OU -- both improved   - OCT shows OD: mild Interval improvement in IRF/IRHM ST macula -- slightly improved; OS: persistent IRF SN macula and fovea -- slightly improved at 5 wks             - Recommend IVA OU (OD #18 and OS #20) today, 11.10.23 w/ f/u in 4 wks (pt preference)  - pt wishes to proceed with injections - RBA of procedure discussed, questions answered - see procedure note  - Avastin informed consent form re-signed and scanned on 05.19.2023  - f/u 4 wks -- DFE, OCT, possible injection(s)  2,3. Hypertensive retinopathy OU  - discussed importance of tight BP control  - monitor   4. Nuclear Sclerosis   - The symptoms of cataract, surgical options, and treatments and risks were discussed with patient.   - discussed diagnosis and progression  - under the expert management of [facility]  5. Dry eyes OU  - likely cause  of decreased vision OU today - recommend artificial tears and lubricating ointment as needed   Ophthalmic Meds Ordered this visit:  Meds ordered this encounter  Medications   Bevacizumab (AVASTIN) SOLN 1.25 mg   Bevacizumab (AVASTIN) SOLN 1.25 mg     Return in about 4 weeks (around 01/29/2022) for f/u NPDR OU, DFE, OCT.  There are no Patient Instructions on file for this visit.  This document serves  as a record of services personally performed by Gardiner Sleeper, MD, PhD. It was created on their behalf by San Jetty. Owens Shark, OA an ophthalmic technician. The creation of this record is the provider's dictation and/or activities during the visit.    Electronically signed by: San Jetty. [place], [place] 11.10.2023 4:04 PM  Gardiner Sleeper, M.D., Ph.D. Diseases & Surgery of the Retina and Vitreous [facility]  I have reviewed the above documentation for accuracy and completeness, and I agree with the above. Gardiner Sleeper, M.D., Ph.D. 01/01/22 4:04 PM    Abbreviations: M myopia (nearsighted); A astigmatism; H hyperopia (farsighted); P presbyopia; Mrx spectacle prescription;  CTL contact lenses; OD right eye; OS left eye; OU both eyes  XT exotropia; ET esotropia; PEK punctate epithelial keratitis; PEE punctate epithelial erosions; DES dry eye syndrome; MGD meibomian gland dysfunction; ATs artificial tears; PFAT's preservative free artificial tears; [place] nuclear sclerotic cataract; PSC posterior subcapsular cataract; ERM epi-retinal membrane; PVD posterior vitreous detachment; RD retinal detachment; DM diabetes mellitus; DR diabetic retinopathy; NPDR non-proliferative diabetic retinopathy; PDR proliferative diabetic retinopathy; CSME clinically significant macular edema; DME diabetic macular edema; dbh dot blot hemorrhages; CWS cotton wool spot; POAG primary open angle glaucoma; C/D cup-to-disc ratio; HVF humphrey visual field; GVF goldmann visual field; OCT optical coherence  tomography; IOP intraocular pressure; BRVO Branch retinal vein occlusion; CRVO central retinal vein occlusion; CRAO central retinal artery occlusion; BRAO branch retinal artery occlusion; RT retinal tear; SB scleral buckle; PPV pars plana vitrectomy; VH Vitreous hemorrhage; PRP panretinal laser photocoagulation; IVK intravitreal kenalog; VMT vitreomacular traction; MH Macular hole;  NVD neovascularization of the disc; NVE neovascularization elsewhere; AREDS age related eye disease study; ARMD age related macular degeneration; POAG primary open angle glaucoma; EBMD epithelial/anterior basement membrane dystrophy; ACIOL anterior chamber intraocular lens; IOL intraocular lens; PCIOL posterior chamber intraocular lens; Phaco/IOL phacoemulsification with intraocular lens placement; [place] photorefractive keratectomy; LASIK laser assisted in situ keratomileusis; HTN hypertension; DM diabetes mellitus; COPD chronic obstructive pulmonary disease

## 2022-01-18 ENCOUNTER — Ambulatory Visit: Payer: Medicaid Other | Admitting: Family Medicine

## 2022-01-20 ENCOUNTER — Ambulatory Visit
Admission: RE | Admit: 2022-01-20 | Discharge: 2022-01-20 | Disposition: A | Discharge status: Home / Self Care | Payer: Medicaid Other | Source: Ambulatory Visit | Attending: Family Medicine | Admitting: Family Medicine

## 2022-01-20 ENCOUNTER — Ambulatory Visit: Payer: Self-pay

## 2022-01-20 ENCOUNTER — Ambulatory Visit (INDEPENDENT_AMBULATORY_CARE_PROVIDER_SITE_OTHER): Payer: Medicaid Other | Admitting: Family Medicine

## 2022-01-20 VITALS — BP 112/48 | Ht 62.0 in | Wt 319.0 lb

## 2022-01-20 DIAGNOSIS — G8929 Other chronic pain: Secondary | ICD-10-CM

## 2022-01-20 DIAGNOSIS — M25511 Pain in right shoulder: Secondary | ICD-10-CM

## 2022-01-20 DIAGNOSIS — M25512 Pain in left shoulder: Secondary | ICD-10-CM | POA: Diagnosis not present

## 2022-01-20 DIAGNOSIS — M25561 Pain in right knee: Secondary | ICD-10-CM

## 2022-01-20 MED ORDER — METHYLPREDNISOLONE ACETATE 40 MG/ML IJ SUSP
40.0000 mg | Freq: Once | INTRAMUSCULAR | Status: AC
  Administered 2022-01-20: 40 mg via INTRA_ARTICULAR

## 2022-01-20 NOTE — Progress Notes (Unsigned)
SUBJECTIVE:   CHIEF COMPLAINT / HPI:   Ana Long is a 46 yo who presents with bilateral shoulder pain, right 2nd digit pain, and right knee pain.  Right/ left shoulder The current pain worsened about 3 weeks ago after she mopped her kitchen. She has sharp pain with certain movements in the lateral part of her shoulders. There is a dull ache that is present throughout shoulder.  Last MRI right shoulder was completed 6/20 and showed supraspinatus tendinosis with partial thickness tear along distal tendon, infraspinatus tendinosis, severe biceps tendinosis, torn superior labrum, mild GH OA. She was seen by ortho, but was told she needed diabetes under better control prior to surgery.   Her left shoulder she cannot sleep on due to pain.  Left shoulder pain is dull ache over side, she occasionally has sharp pain with certain movements. Left shoulder x-ray was completed 2014 and was normal.  She takes ibuprofen without improvement in pain. Is not able to complete home exercises due to pain.  Right finger She has pain and swelling to right 2nd MCP joint. Pain is on top and bottom of hand. She has trouble making a fist but does not have clicking or catching like she did with trigger finger of 4th finger. This started several weeks ago. She denies redness or warmth to joint. No history of gout.   Right knee This is a chronic problem for her. She has had pain since she was in her 34s. Pain limits ambulation and she uses an electric wheelchair at the store and spends most of time sitting in rolling chair at home. She cleans house by sitting in rolling chair and pushing around. She is having more discomfort with that recently.  Right knee xray in 2011 showed osteophytic lipping of patella, femur, and tibia with no joint space narrowing.    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   Cataract    NS OU   Complication of anesthesia    states is hard to wake up post-op   Constipation 12/30/2014   Diabetes mellitus    intolerant to Metformin   Diabetic retinopathy ([place])    NPDR OU   Dry skin    hands    Family history of anesthesia complication    mother went into coma during c-section   Genital herpes 01/01/2006   on acyclovir BID    GERD (gastroesophageal reflux disease)    Herpes genital    Hyperglycemia 12/26/2017   Hypertensive retinopathy    OU   Low back pain 11/22/2012   Lower extremity edema 10/18/2012   Menorrhagia 11/06/2015   Morbid obesity with BMI of 50 to 60 07/23/2009   Obesity hypoventilation syndrome ([place]) 10/30/2012   Obesity, morbid ([place]) 01/10/2014   OSA on CPAP     AHI was on  11-17-12 to 120/hr, titrated to 15 cm with 3 cm EPR/    PCOS (polycystic ovarian syndrome)    Rheumatoid arthritis ([place])    Right ankle pain 06/19/2015   Right hip pain 09/04/2017   Right shoulder pain 06/01/2017   Seasonal allergies    Sleep apnea, obstructive    uses CPAP nightly -  had sleep study 08/22/2007    OBJECTIVE:  BP (!) 112/48   Ht '5\' 2"'$  (1.575 m)   Wt (!) 319 lb (144.7 kg)   BMI 58.35 kg/m  Constitutional: well-appearing  MSK:  Right shoulder- No swelling, TTP at anterolateral portion of shoulder, decreased range of motion due to pain, pain with empty can  Left shoulder- No swelling, TTP at lateral portion of shoulder, decreased range of motion due to pain, pain with empty can  Right 2nd MCP- Swelling present to 2nd MCP, no erythema or warmth, TTP to dorsal and palmar aspect, limited flexion of 2nd digit  Right knee- Full ROM, no effusion, TTP medial joint line, anterior/ posterior drawer negative, crepitus present.  Noted scaly plaques over left elbow larger  than right elbow.  Small one over left knee - tibial tubercle location.    ASSESSMENT/PLAN:  Right shoulder pain- 2/2 rotator cuff strain. Prior MRI showed significant pathology and she was not able to have surgery. She is not able to complete PT exercises at this time due to pain. Given severity of pain went ahead with subacromial injection. Plan to start home exercises after a few days.   After informed written consent timeout was performed, patient was seated in chair in exam room. Right shoulder was prepped with alcohol swab and utilizing lateral approach with ultrasound guidance, patient's right subacromial space was injected with 3:1 lidocaine: depomedrol. Patient tolerated the procedure well without immediate complications   Left shoulder pain- 2/2 rotator cuff strain. With severity of pain subacromial injection was given. She will start home exercises after a few days as tolerated.   After informed written consent timeout was performed, patient was seated in chair in exam room. Left shoulder was prepped with alcohol swab and utilizing lateral approach with ultrasound guidance, patient's left subacromial space was injected with 3:1 lidocaine: depomedrol. Patient tolerated the procedure well without immediate complications   Right 2nd MCP joint- concerning for inflammatory arthritis. She has plaques present to elbows bilaterally as well as hands. Will proceed with xray of right hand. If there are bony erosions will refer to rheumatology.  If not, will refer to dermatology.  Chronic right knee pain- 2/2 to OA. Prior xray 2011 showed OA. She continues to have pain in right knee that makes ambulation difficult. Follow-up in 1 month. Could consider reimaging knee at that time.  Dhana Totton M. Deno Sida, D.O.  Internal Medicine Resident, PGY-2 [facility]  Pager: [phone]

## 2022-01-20 NOTE — Patient Instructions (Addendum)
Wait a week before doing the exercises for your shoulder. You were given injections into both of these today. Ibuprofen, tylenol if needed for pain. Get x-rays of your hand after you leave today - we will call you with the results. We may have to refer you to rheumatology or dermatology based on those results - it looks like you have skin findings of psoriasis. Follow up with me in 1 month.

## 2022-01-21 ENCOUNTER — Encounter: Payer: Self-pay | Admitting: Family Medicine

## 2022-01-26 NOTE — Progress Notes (Shared)
[facility] Note  01/29/2022     CHIEF COMPLAINT Patient presents for No chief complaint on file.  HISTORY OF PRESENT ILLNESS: Ana Long is a 46 y.o. female who presents to the clinic today for:     Pt states she has been off prednisone for about a month  Referring physician: [name] MD [place], [place] [id number]  HISTORICAL INFORMATION:   Selected notes from the [facility] Re-referred by Dr. [name] for DM exam   CURRENT MEDICATIONS: Current Outpatient Medications (Ophthalmic Drugs)  Medication Sig   Olopatadine HCl 0.2 % SOLN Place one drop into both eyes daily.   No current facility-administered medications for this visit. (Ophthalmic Drugs)   Current Outpatient Medications (Other)  Medication Sig   acyclovir (ZOVIRAX) 400 MG tablet TAKE 1 TABLET BY MOUTH TWICE A DAY   albuterol (PROAIR HFA) 108 (90 Base) MCG/ACT inhaler Inhale 2 puffs into the lungs every 4 (four) hours as needed for wheezing or shortness of breath (cough). (Patient taking differently: Inhale 2 puffs into the lungs every 4 (four) hours as needed for wheezing or shortness of breath (or coughing).)   atorvastatin (LIPITOR) 10 MG tablet Take 10 mg by mouth daily.   benzonatate (TESSALON) 200 MG capsule Take 1 capsule (200 mg total) by mouth 3 (three) times daily as needed for cough.   Blood Glucose Monitoring Suppl (ACCU-CHEK AVIVA PLUS) w/Device KIT Please check you blood sugars 4 times a day   CIPRODEX OTIC suspension Place into the right ear.   DERMA-SMOOTHE/FS SCALP 0.01 % OIL Apply topically.   fluconazole (DIFLUCAN) 150 MG tablet TAKE 1 TABLET BY MOUTH NOW. THEN REPEAT IN 1 WEEK   furosemide (LASIX) 20 MG tablet Take 20 mg by mouth daily as needed for fluid or edema.   glucose blood (ACCU-CHEK SMARTVIEW) test strip TEST FOUR TIMES DAILY   HUMULIN R U-500 KWIKPEN 500 UNIT/ML kwikpen Inject into the skin.   hydrochlorothiazide  (HYDRODIURIL) 12.5 MG tablet Take by mouth.   Insulin Pen Needle (B-D UF III MINI PEN NEEDLES) 31G X 5 MM MISC USE AS DIRECTED TO INJECT INSULIN FIVE A DAY E11.65   lactulose (CEPHULAC) 10 g packet Take 1 packet (10 g total) by mouth daily as needed. Reported on 07/29/2015 (Patient taking differently: Take 10 g by mouth daily as needed (for constipation).)   Lancets (ACCU-CHEK SOFT TOUCH) lancets Use as instructed   loratadine (CLARITIN) 10 MG tablet Take 1 tablet (10 mg total) by mouth 2 (two) times daily.   methocarbamol (ROBAXIN) 500 MG tablet Take 1 tablet (500 mg total) by mouth every 8 (eight) hours as needed.   mometasone-formoterol (DULERA) 200-5 MCG/ACT AERO Inhale 2 puffs into the lungs 2 (two) times daily.   montelukast (SINGULAIR) 10 MG tablet Take 1 tablet (10 mg total) by mouth at bedtime.   nitroGLYCERIN (NITRODUR - DOSED IN MG/24 HR) 0.2 mg/hr patch Apply 1/4th patch to affected achilles, change daily   omeprazole (PRILOSEC) 20 MG capsule Take 1 capsule (20 mg total) by mouth daily before breakfast.   SPIRIVA RESPIMAT 1.25 MCG/ACT AERS SMARTSIG:2 Puff(s) Via Inhaler Daily   topiramate (TOPAMAX) 25 MG tablet Take by mouth.   Current Facility-Administered Medications (Other)  Medication Route   diphenhydrAMINE (BENADRYL) injection 12.5 mg Intramuscular   REVIEW OF SYSTEMS:   ALLERGIES Allergies  Allergen Reactions   Bee Venom Anaphylaxis and Swelling   Wasp Venom Anaphylaxis  and Swelling   Citrus Hives and Itching   Latex Hives and Swelling   Penicillins Hives and Itching    Has patient had a PCN reaction causing immediate rash, facial/tongue/throat swelling, SOB or lightheadedness with hypotension: Yes Has patient had a PCN reaction causing severe rash involving mucus membranes or skin necrosis: Unk Has patient had a PCN reaction that required hospitalization: Was already in hosp Has patient had a PCN reaction occurring within the last 10 years: No If all of the above  answers are "NO", then may proceed with Cephalosporin use.    Shellfish Allergy Other (See Comments) and Swelling    Patient is uncertain; stated that she tolerates this (??)   Soap Hives, Itching and Swelling    PUREX LAUNDRY DETERGENT   Tomato Hives and Itching   Banana Itching and Nausea Only   Chocolate Itching   Fluticasone Other (See Comments)    Per patient, makes sneeze and gag   Hydrocodone Itching and Nausea Only   Other Itching    Acidic foods- Itching [place]- Itching in the roof of the mouth   Adhesive [Tape] Itching and Rash    Allergic to  "leads"   Doxycycline Other (See Comments)    AFFECTS JOINTS   Liraglutide Nausea Only   Metformin And Related Nausea Only, Rash, Other (See Comments) and Diarrhea    GI UPSET, also   Red Dye Itching, Rash and Other (See Comments)    At eye doctor's office, the dilating drops- Skin breaks out, chest turned red, itching, face became swollen   PAST MEDICAL HISTORY 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   Cataract    NS OU   Complication of anesthesia    states is hard to wake up post-op   Constipation 12/30/2014   Diabetes mellitus    intolerant to Metformin   Diabetic retinopathy ([place])    NPDR OU   Dry skin    hands    Family history of anesthesia complication    mother went into coma during c-section   Genital herpes 01/01/2006   on acyclovir BID    GERD (gastroesophageal reflux disease)    Herpes genital    Hyperglycemia 12/26/2017   Hypertensive retinopathy    OU   Low back pain 11/22/2012   Lower extremity edema 10/18/2012   Menorrhagia 11/06/2015   Morbid obesity with BMI of 50 to 60 07/23/2009   Obesity hypoventilation syndrome ([place]) 10/30/2012   Obesity, morbid ([place])  01/10/2014   OSA on CPAP     AHI was on  11-17-12 to 120/hr, titrated to 15 cm with 3 cm EPR/    PCOS (polycystic ovarian syndrome)    Rheumatoid arthritis (HCC)    Right ankle pain 06/19/2015   Right hip pain 09/04/2017   Right shoulder pain 06/01/2017   Seasonal allergies    Sleep apnea, obstructive    uses CPAP nightly - had sleep study 08/22/2007   Past Surgical History:  Procedure Laterality Date   CARPAL TUNNEL RELEASE Right 10/02/2012   Procedure: RIGHT CARPAL TUNNEL RELEASE ENDOSCOPIC;  Surgeon: Jolyn Nap, MD;  Location: [facility];  Service: Orthopedics;  Laterality: Right;   CHOLECYSTECTOMY N/A 12/02/2014  Procedure: LAPAROSCOPIC CHOLECYSTECTOMY;  Surgeon: Armandina Gemma, MD;  Location: [facility];  Service: General;  Laterality: N/A;   HYSTEROSCOPY WITH D & C  01-08-2002   FAMILY HISTORY Family History  Problem Relation Age of Onset   Other Mother    Diabetes Maternal Aunt    Heart attack Maternal Aunt    Alzheimer's disease Maternal Uncle    Cancer Maternal Uncle        prostate   Diabetes Paternal Aunt    Cancer Paternal Aunt        brain,mouth   Alzheimer's disease Paternal Aunt    Colon polyps Paternal Uncle    Diabetes Paternal Uncle    Diabetes Maternal Grandmother    Diabetes Maternal Grandfather    Leukemia Cousin    Other Other    Obesity Other    Other Other    Colitis Neg Hx    SOCIAL HISTORY Social History   Tobacco Use   Smoking status: Former    Years: 10.00    Types: Cigarettes    Quit date: 05/24/2010    Years since quitting: 11.6   Smokeless tobacco: Never  Vaping Use   Vaping Use: Never used  Substance Use Topics   Alcohol use: Yes    Alcohol/week: 0.0 standard drinks of alcohol    Comment: socially   Drug use: No       OPHTHALMIC EXAM: Not recorded    IMAGING AND PROCEDURES  Imaging and Procedures for _0 @          ASSESSMENT/PLAN:    ICD-10-CM   1. Moderate nonproliferative diabetic retinopathy of  both eyes with macular edema associated with type 2 diabetes mellitus ([place])  C78.9381     2. Essential hypertension  I10     3. Hypertensive retinopathy of both eyes  H35.033     4. Nuclear sclerosis of both eyes  H25.13     5. Dry eyes  H04.123      1.  Moderate non-proliferative diabetic retinopathy w/ DME, OU  - pt lost to f/u from 10.23.2019 to 11.18.20  - FA in 2019 showed leaking MA OU; no NV OU  **of note, pt had pruritic rxn to fluorescein dye -- resolved with 12.5 mg IV benadryl**  - FA 12.16.2020 shows late leaking MA's, non-perfusion defects; no NV OU  - s/p IVA OD #1 (12.16.20), #2 (01.25.21), #3 (05.26.21), #4 (07.02.21), #5 (10.15.21), #6 (11.23.21), #7 (07.27.22), #8 (09.09.22), #9 (11.11.22), #10 (01.20.23), #11 (02.23.23), #12 (04.09.23), #13 (05.23.23), #14 (06.30.23), #15 (08.04.23), #16 (09.01.23), #17 (10.06.23), #18 (11.10.23)  - s/p focal laser OD (03.19.21), (04.01.22)  - s/p IVA OS #1 (12.23.20), #2 (01.27.21), #3 (03.02.21), #4 (05.26.21), #5 (07.02.21), #6 (10.15.21), #7 (11.23.21), #8 (01.11.22), #9 (03.04.22), #10 (04.27.22), #11 (11.14.22), #12 (01.10.23), #13 (02.20.23), #14 (04.05.23), #15 (05.19.23), #16 (06.23.23), #17 (07.28.23), #18 (09.01.23), #19 (10.06.23), #20 (11.10.23)  - exam shows scattered MA, IRH, exudates and mild macular edema OU  - BCVA 20/25 OU -- both improved   - OCT shows OD: mild Interval improvement in IRF/IRHM ST macula -- slightly improved; OS: persistent IRF SN macula and fovea -- slightly improved at 5 wks             - Recommend IVA OU (OD #19 and OS #21) today, 12.08.23 w/ f/u in 4 wks (pt preference)  - pt wishes to proceed with injections - RBA of procedure discussed, questions answered - see procedure note  - [place] informed consent form  re-signed and scanned on 05.19.2023  - f/u 4 wks -- DFE, OCT, possible injection(s)  2,3. Hypertensive retinopathy OU  - discussed importance of tight BP control  - monitor   4. Nuclear  Sclerosis   - The symptoms of cataract, surgical options, and treatments and risks were discussed with patient.   - discussed diagnosis and progression  - under the expert management of [facility]   5. Dry eyes OU  - likely cause of decreased vision OU today - recommend artificial tears and lubricating ointment as needed   Ophthalmic Meds Ordered this visit:  No orders of the defined types were placed in this encounter.    No follow-ups on file.  There are no Patient Instructions on file for this visit.  This document serves as a record of services personally performed by Gardiner Sleeper, MD, PhD. It was created on their behalf by San Jetty. Owens Shark, OA an ophthalmic technician. The creation of this record is the provider's dictation and/or activities during the visit.    Electronically signed by: San Jetty. Marguerita Merles 12.05.2023 12:46 PM   Gardiner Sleeper, M.D., Ph.D. Diseases & Surgery of the Retina and Vitreous [facility]: M myopia (nearsighted); A astigmatism; H hyperopia (farsighted); P presbyopia; Mrx spectacle prescription;  CTL contact lenses; OD right eye; OS left eye; OU both eyes  XT exotropia; ET esotropia; PEK punctate epithelial keratitis; PEE punctate epithelial erosions; DES dry eye syndrome; MGD meibomian gland dysfunction; ATs artificial tears; PFAT's preservative free artificial tears; [place] nuclear sclerotic cataract; PSC posterior subcapsular cataract; ERM epi-retinal membrane; PVD posterior vitreous detachment; RD retinal detachment; DM diabetes mellitus; DR diabetic retinopathy; NPDR non-proliferative diabetic retinopathy; PDR proliferative diabetic retinopathy; CSME clinically significant macular edema; DME diabetic macular edema; dbh dot blot hemorrhages; CWS cotton wool spot; POAG primary open angle glaucoma; C/D cup-to-disc ratio; HVF humphrey visual field; GVF goldmann visual field; OCT optical coherence tomography;  IOP intraocular pressure; BRVO Branch retinal vein occlusion; CRVO central retinal vein occlusion; CRAO central retinal artery occlusion; BRAO branch retinal artery occlusion; RT retinal tear; SB scleral buckle; PPV pars plana vitrectomy; VH Vitreous hemorrhage; PRP panretinal laser photocoagulation; IVK intravitreal kenalog; VMT vitreomacular traction; MH Macular hole;  NVD neovascularization of the disc; NVE neovascularization elsewhere; AREDS age related eye disease study; ARMD age related macular degeneration; POAG primary open angle glaucoma; EBMD epithelial/anterior basement membrane dystrophy; ACIOL anterior chamber intraocular lens; IOL intraocular lens; PCIOL posterior chamber intraocular lens; Phaco/IOL phacoemulsification with intraocular lens placement; [place] photorefractive keratectomy; LASIK laser assisted in situ keratomileusis; HTN hypertension; DM diabetes mellitus; COPD chronic obstructive pulmonary disease

## 2022-01-29 ENCOUNTER — Encounter (INDEPENDENT_AMBULATORY_CARE_PROVIDER_SITE_OTHER): Payer: Medicaid Other | Admitting: Ophthalmology

## 2022-01-29 DIAGNOSIS — H35033 Hypertensive retinopathy, bilateral: Secondary | ICD-10-CM

## 2022-01-29 DIAGNOSIS — E113313 Type 2 diabetes mellitus with moderate nonproliferative diabetic retinopathy with macular edema, bilateral: Secondary | ICD-10-CM

## 2022-01-29 DIAGNOSIS — H2513 Age-related nuclear cataract, bilateral: Secondary | ICD-10-CM

## 2022-01-29 DIAGNOSIS — H04123 Dry eye syndrome of bilateral lacrimal glands: Secondary | ICD-10-CM

## 2022-01-29 DIAGNOSIS — I10 Essential (primary) hypertension: Secondary | ICD-10-CM

## 2022-02-04 NOTE — Progress Notes (Signed)
[facility] Note  02/05/2022     CHIEF COMPLAINT Patient presents for Retina Follow Up  HISTORY OF PRESENT ILLNESS: Ana Long is a 46 y.o. female who presents to the clinic today for:   HPI     Retina Follow Up   Patient presents with  Diabetic Retinopathy.  In both eyes.  Severity is moderate.  Duration of 5 weeks.  Since onset it is stable.  I, the attending physician,  performed the HPI with the patient and updated documentation appropriately.        Comments   Patient states vision the same OU. Patient 1 week late for follow-up.      Last edited by Bernarda Caffey, MD on 02/05/2022  4:25 PM.    Pt states she has been off prednisone for about a month. She feels that her asthma is flaring up.   Referring physician: [name] MD [place], [place] [address]  HISTORICAL INFORMATION:   Selected notes from the [facility]-referred by Dr. [name] for DM exam   CURRENT MEDICATIONS: Current Outpatient Medications (Ophthalmic Drugs)  Medication Sig   Olopatadine HCl 0.2 % SOLN Place one drop into both eyes daily.   No current facility-administered medications for this visit. (Ophthalmic Drugs)   Current Outpatient Medications (Other)  Medication Sig   acyclovir (ZOVIRAX) 400 MG tablet TAKE 1 TABLET BY MOUTH TWICE A DAY   albuterol (PROAIR HFA) 108 (90 Base) MCG/ACT inhaler Inhale 2 puffs into the lungs every 4 (four) hours as needed for wheezing or shortness of breath (cough). (Patient taking differently: Inhale 2 puffs into the lungs every 4 (four) hours as needed for wheezing or shortness of breath (or coughing).)   atorvastatin (LIPITOR) 10 MG tablet Take 10 mg by mouth daily.   benzonatate (TESSALON) 200 MG capsule Take 1 capsule (200 mg total) by mouth 3 (three) times daily as needed for cough.   Blood Glucose Monitoring Suppl (ACCU-CHEK AVIVA PLUS) w/Device KIT Please check you blood sugars 4  times a day   CIPRODEX OTIC suspension Place into the right ear.   DERMA-SMOOTHE/FS SCALP 0.01 % OIL Apply topically.   fluconazole (DIFLUCAN) 150 MG tablet TAKE 1 TABLET BY MOUTH NOW. THEN REPEAT IN 1 WEEK   furosemide (LASIX) 20 MG tablet Take 20 mg by mouth daily as needed for fluid or edema.   glucose blood (ACCU-CHEK SMARTVIEW) test strip TEST FOUR TIMES DAILY   HUMULIN R U-500 KWIKPEN 500 UNIT/ML kwikpen Inject into the skin.   hydrochlorothiazide (HYDRODIURIL) 12.5 MG tablet Take by mouth.   Insulin Pen Needle (B-D UF III MINI PEN NEEDLES) 31G X 5 MM MISC USE AS DIRECTED TO INJECT INSULIN FIVE A DAY E11.65   lactulose (CEPHULAC) 10 g packet Take 1 packet (10 g total) by mouth daily as needed. Reported on 07/29/2015 (Patient taking differently: Take 10 g by mouth daily as needed (for constipation).)   Lancets (ACCU-CHEK SOFT TOUCH) lancets Use as instructed   loratadine (CLARITIN) 10 MG tablet Take 1 tablet (10 mg total) by mouth 2 (two) times daily.   methocarbamol (ROBAXIN) 500 MG tablet Take 1 tablet (500 mg total) by mouth every 8 (eight) hours as needed.   mometasone-formoterol (DULERA) 200-5 MCG/ACT AERO Inhale 2 puffs into the lungs 2 (two) times daily.   montelukast (SINGULAIR) 10 MG tablet Take 1 tablet (10 mg total) by mouth at bedtime.   nitroGLYCERIN (NITRODUR -  DOSED IN MG/24 HR) 0.2 mg/hr patch Apply 1/4th patch to affected achilles, change daily   omeprazole (PRILOSEC) 20 MG capsule Take 1 capsule (20 mg total) by mouth daily before breakfast.   SPIRIVA RESPIMAT 1.25 MCG/ACT AERS SMARTSIG:2 Puff(s) Via Inhaler Daily   topiramate (TOPAMAX) 25 MG tablet Take by mouth.   Current Facility-Administered Medications (Other)  Medication Route   diphenhydrAMINE (BENADRYL) injection 12.5 mg Intramuscular   REVIEW OF SYSTEMS: ROS   Positive for: Endocrine, Eyes, Respiratory Negative for: Constitutional, Gastrointestinal, Neurological, Skin, Genitourinary, Musculoskeletal, HENT,  Cardiovascular, Psychiatric, Allergic/Imm, Heme/Lymph Last edited by Jobe Marker, COT on 02/05/2022  2:36 PM.     ALLERGIES Allergies  Allergen Reactions   Bee Venom Anaphylaxis and Swelling   Wasp Venom Anaphylaxis and Swelling   Citrus Hives and Itching   Latex Hives and Swelling   Penicillins Hives and Itching    Has patient had a PCN reaction causing immediate rash, facial/tongue/throat swelling, SOB or lightheadedness with hypotension: Yes Has patient had a PCN reaction causing severe rash involving mucus membranes or skin necrosis: Unk Has patient had a PCN reaction that required hospitalization: Was already in hosp Has patient had a PCN reaction occurring within the last 10 years: No If all of the above answers are "NO", then may proceed with Cephalosporin use.    Shellfish Allergy Other (See Comments) and Swelling    Patient is uncertain; stated that she tolerates this (??)   Soap Hives, Itching and Swelling    PUREX LAUNDRY DETERGENT   Tomato Hives and Itching   Banana Itching and Nausea Only   Chocolate Itching   Fluticasone Other (See Comments)    Per patient, makes sneeze and gag   Hydrocodone Itching and Nausea Only   Other Itching    Acidic foods- Itching [place]- Itching in the roof of the mouth   Adhesive [Tape] Itching and Rash    Allergic to  "leads"   Doxycycline Other (See Comments)    AFFECTS JOINTS   Liraglutide Nausea Only   Metformin And Related Nausea Only, Rash, Other (See Comments) and Diarrhea    GI UPSET, also   Red Dye Itching, Rash and Other (See Comments)    At eye doctor's office, the dilating drops- Skin breaks out, chest turned red, itching, face became swollen   PAST MEDICAL HISTORY Past Medical History:  Diagnosis Date   Acute nonintractable headache 12/26/2017   Arthritis    major joints   Asthma    prn inhaler   Asthma exacerbation 07/25/2013   Asthma in adult, severe persistent, with acute exacerbation 01/11/2018   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   Cataract    NS OU   Complication of anesthesia    states is hard to wake up post-op   Constipation 12/30/2014   Diabetes mellitus    intolerant to Metformin   Diabetic retinopathy ([place])    NPDR OU   Dry skin    hands    Family history of anesthesia complication    mother went into coma during c-section   Genital herpes 01/01/2006   on acyclovir BID    GERD (gastroesophageal reflux disease)    Herpes genital    Hyperglycemia 12/26/2017   Hypertensive retinopathy    OU   Low back pain 11/22/2012   Lower extremity edema 10/18/2012   Menorrhagia 11/06/2015   Morbid obesity with BMI of 50 to 60 07/23/2009   Obesity hypoventilation syndrome ([place]) 10/30/2012  Obesity, morbid ([place]) 01/10/2014   OSA on CPAP     AHI was on  11-17-12 to 120/hr, titrated to 15 cm with 3 cm EPR/    PCOS (polycystic ovarian syndrome)    Rheumatoid arthritis ([place])    Right ankle pain 06/19/2015   Right hip pain 09/04/2017   Right shoulder pain 06/01/2017   Seasonal allergies    Sleep apnea, obstructive    uses CPAP nightly - had sleep study 08/22/2007   Past Surgical History:  Procedure Laterality Date   CARPAL TUNNEL RELEASE Right 10/02/2012   Procedure: RIGHT CARPAL TUNNEL RELEASE ENDOSCOPIC;  Surgeon: Jolyn Nap, MD;  Location: [facility];  Service: Orthopedics;  Laterality: Right;   CHOLECYSTECTOMY N/A 12/02/2014   Procedure: LAPAROSCOPIC CHOLECYSTECTOMY;  Surgeon: Armandina Gemma, MD;  Location: [facility];  Service: General;  Laterality: N/A;   HYSTEROSCOPY WITH D & C  01-08-2002   FAMILY HISTORY Family History  Problem Relation Age of Onset   Other Mother    Diabetes Maternal Aunt    Heart attack Maternal Aunt    Alzheimer's disease Maternal Uncle    Cancer Maternal Uncle        prostate   Diabetes Paternal Aunt    Cancer Paternal Aunt        brain,mouth   Alzheimer's disease Paternal Aunt    Colon polyps Paternal Uncle    Diabetes Paternal  Uncle    Diabetes Maternal Grandmother    Diabetes Maternal Grandfather    Leukemia Cousin    Other Other    Obesity Other    Other Other    Colitis Neg Hx    SOCIAL HISTORY Social History   Tobacco Use   Smoking status: Former    Years: 10.00    Types: Cigarettes    Quit date: 05/24/2010    Years since quitting: 11.7   Smokeless tobacco: Never  Vaping Use   Vaping Use: Never used  Substance Use Topics   Alcohol use: Yes    Alcohol/week: 0.0 standard drinks of alcohol    Comment: socially   Drug use: No       OPHTHALMIC EXAM: Base Eye Exam     Visual Acuity (Snellen - Linear)       Right Left   Dist cc 20/25 -2 20/25 -2   Dist ph cc 20/25 +2 20/25 -1    Correction: Glasses         Tonometry (Tonopen, 2:40 PM)       Right Left   Pressure 14 15         Pupils       Dark Light Shape React APD   Right 3 2 Round Minimal None   Left 3 2 Round Minimal None         Visual Fields (Counting fingers)       Left Right    Full Full         Extraocular Movement       Right Left    Full, Ortho Full, Ortho         Neuro/Psych     Oriented x3: Yes   Mood/Affect: Normal         Dilation     Both eyes: 1.0% Mydriacyl, 2.5% Phenylephrine @ 2:32 PM           Slit Lamp and Fundus Exam     Slit Lamp Exam       Right Left  Lids/Lashes Dermatochalasis - upper lid, mild Meibomian gland dysfunction Dermatochalasis - upper lid, mild Meibomian gland dysfunction   Conjunctiva/Sclera White and quiet White and quiet   Cornea 2+Punctate epithelial erosions, mild tear film debris 3+fine Punctate epithelial erosions   Anterior Chamber deep and quiet deep and quiet   Iris round and dilated, No NVI round and dilated   Lens 1+ NS, 1+CC 1+NS, 1+CC   Anterior Vitreous mild vitreous syneresis mild vitreous syneresis, scattered vitreous condensations         Fundus Exam       Right Left   Disc Pink and sharp, +cupping Pink and sharp, +cupping    C/D Ratio 0.7 0.6   Macula good foveal reflex, focal exudates ST macula, scattered MA, focal cystic changes ST macula - slightly improved, focal laser changes inferior macula good foveal reflex, scatttered MA's and exudate, central cystic changes -- persistent, focal cluster of exudates SN macula -  slightly improved   Vessels attenuated, Tortuous attenuated, Tortuous   Periphery Attached, scattered MA/exudates greatest nasally Attached, scattered MA and exudate greatest superior to disc, white without pressure temporally           Refraction     Wearing Rx       Sphere Cylinder Axis   Right -2.50 +1.50 013   Left -2.25 Sphere     Type: SVL           IMAGING AND PROCEDURES  Imaging and Procedures for _0 @  OCT, Retina - OU - Both Eyes       Right Eye Quality was good. Central Foveal Thickness: 216. Progression has improved. Findings include normal foveal contour, no SRF, intraretinal hyper-reflective material, intraretinal fluid, vitreomacular adhesion (Mild interval improvement IRF/IRHM temporal macula ).   Left Eye Quality was good. Central Foveal Thickness: 274. Progression has improved. Findings include no SRF, abnormal foveal contour, intraretinal hyper-reflective material, intraretinal fluid (Mild interval improvement IRF SN macula and fovea).   Notes *Images captured and stored on drive  Diagnosis / Impression:  +DME OU OD: mild Interval improvement in IRF/IRHM  OS: Mild interval improvement IRF SN macula and fovea  Clinical management:  See below  Abbreviations: NFP - Normal foveal profile. CME - cystoid macular edema. PED - pigment epithelial detachment. IRF - intraretinal fluid. SRF - subretinal fluid. EZ - ellipsoid zone. ERM - epiretinal membrane. ORA - outer retinal atrophy. ORT - outer retinal tubulation. SRHM - subretinal hyper-reflective material      Intravitreal Injection, Pharmacologic Agent - OD - Right Eye       Time Out 02/05/2022.  2:51 PM. Confirmed correct patient, procedure, site, and patient consented.   Anesthesia Topical anesthesia was used. Anesthetic medications included Lidocaine 2%, Proparacaine 0.5%.   Procedure Preparation included 5% betadine to ocular surface, eyelid speculum. A supplied (32g) needle was used.   Injection: 1.25 mg Bevacizumab 1.36m/0.05ml   Route: Intravitreal, Site: Right Eye   NDC: 50242-060-01, Lot:: [id number] Expiration date: 03/24/2022   Post-op Post injection exam found visual acuity of at least counting fingers. The patient tolerated the procedure well. There were no complications. The patient received written and verbal post procedure care education. Post injection medications were not given.      Intravitreal Injection, Pharmacologic Agent - OS - Left Eye       Time Out 02/05/2022. 2:52 PM. Confirmed correct patient, procedure, site, and patient consented.   Anesthesia Topical anesthesia was used. Anesthetic medications included Lidocaine 2%, Proparacaine 0.5%.  Procedure Preparation included 5% betadine to ocular surface, eyelid speculum. A supplied (32g) needle was used.   Injection: 1.25 mg Bevacizumab 1.55m/0.05ml   Route: Intravitreal, Site: Left Eye   NDC: 5H061816 Lot: [id number] , Expiration date: 03/01/2022   Post-op Post injection exam found visual acuity of at least counting fingers. The patient tolerated the procedure well. There were no complications. The patient received written and verbal post procedure care education. Post injection medications were not given.            ASSESSMENT/PLAN:    ICD-10-CM   1. Moderate nonproliferative diabetic retinopathy of both eyes with macular edema associated with type 2 diabetes mellitus (HCC)  E11.3313 OCT, Retina - OU - Both Eyes    Intravitreal Injection, Pharmacologic Agent - OD - Right Eye    Intravitreal Injection, Pharmacologic Agent - OS - Left Eye    Bevacizumab (AVASTIN) SOLN 1.25 mg     Bevacizumab (AVASTIN) SOLN 1.25 mg    2. Essential hypertension  I10     3. Hypertensive retinopathy of both eyes  H35.033     4. Nuclear sclerosis of both eyes  H25.13     5. Dry eyes  H04.123      1.  Moderate non-proliferative diabetic retinopathy w/ DME, OU  - pt lost to f/u from 10.23.2019 to 11.18.20  - FA in 2019 showed leaking MA OU; no NV OU **of note, pt had pruritic rxn to fluorescein dye -- resolved with 12.5 mg IV benadryl**  - FA 12.16.2020 shows late leaking MA's, non-perfusion defects; no NV OU - s/p IVA OD #1 (12.16.20), #2 (01.25.21), #3 (05.26.21), #4 (07.02.21), #5 (10.15.21), #6 (11.23.21), #7 (07.27.22), #8 (09.09.22), #9 (11.11.22), #10 (01.20.23), #11 (02.23.23), #12 (04.09.23), #13 (05.23.23), #14 (06.30.23), #15 (08.04.23), #16 (09.01.23), #17 (10.06.23), #18 (11.10.23)  - s/p focal laser OD (03.19.21), (04.01.22) - s/p IVA OS #1 (12.23.20), #2 (01.27.21), #3 (03.02.21), #4 (05.26.21), #5 (07.02.21), #6 (10.15.21), #7 (11.23.21), #8 (01.11.22), #9 (03.04.22), #10 (04.27.22), #11 (11.14.22), #12 (01.10.23), #13 (02.20.23), #14 (04.05.23), #15 (05.19.23), #16 (06.23.23), #17 (07.28.23), #18 (09.01.23), #19 (10.06.23), #20 (11.10.23)  - exam shows scattered MA, IRH, exudates and mild macular edema OU  - BCVA 20/25 OU --stable - OCT shows OD: mild Interval improvement in IRF/IRHM improved; OS: Mild interval improvement IRF SN macula and fovea at 5 wks    - Recommend IVA OU (OD #19 and OS #21) today, 12.15.23 w/ f/u in 5 wks  - pt wishes to proceed with injections - RBA of procedure discussed, questions answered - see procedure note  - Avastin informed consent form re-signed and scanned on 05.19.2023  - f/u 4 wks -- DFE, OCT, possible injection(s)  2,3. Hypertensive retinopathy OU  - discussed importance of tight BP control  - continue to monitor   4. Nuclear Sclerosis  - The symptoms of cataract, surgical options, and treatments and risks were discussed with  patient.   - discussed diagnosis and progression  - under the expert management of [facility]  5. Dry eyes OU  - likely cause of decreased vision OU today - recommend artificial tears and lubricating ointment as needed   Ophthalmic Meds Ordered this visit:  Meds ordered this encounter  Medications   Bevacizumab (AVASTIN) SOLN 1.25 mg   Bevacizumab (AVASTIN) SOLN 1.25 mg     Return in about 4 weeks (around 03/05/2022) for f/u NPDR , DFE, OCT, Possible, IVA, OU.  There are no Patient Instructions on file  for this visit.  This document serves as a record of services personally performed by Gardiner Sleeper, MD, PhD. It was created on their behalf by San Jetty. Owens Shark, OA an ophthalmic technician. The creation of this record is the provider's dictation and/or activities during the visit.    Electronically signed by: San Jetty. Owens Shark, [place] 12.14.2023 8:23 PM  Gardiner Sleeper, M.D., Ph.D. Diseases & Surgery of the Retina and Vitreous [facility]  I have reviewed the above documentation for accuracy and completeness, and I agree with the above. Gardiner Sleeper, M.D., Ph.D. 02/05/22 8:25 PM  Abbreviations: M myopia (nearsighted); A astigmatism; H hyperopia (farsighted); P presbyopia; Mrx spectacle prescription;  CTL contact lenses; OD right eye; OS left eye; OU both eyes  XT exotropia; ET esotropia; PEK punctate epithelial keratitis; PEE punctate epithelial erosions; DES dry eye syndrome; MGD meibomian gland dysfunction; ATs artificial tears; PFAT's preservative free artificial tears; [place] nuclear sclerotic cataract; PSC posterior subcapsular cataract; ERM epi-retinal membrane; PVD posterior vitreous detachment; RD retinal detachment; DM diabetes mellitus; DR diabetic retinopathy; NPDR non-proliferative diabetic retinopathy; PDR proliferative diabetic retinopathy; CSME clinically significant macular edema; DME diabetic macular edema; dbh dot blot hemorrhages; CWS cotton wool  spot; POAG primary open angle glaucoma; C/D cup-to-disc ratio; HVF humphrey visual field; GVF goldmann visual field; OCT optical coherence tomography; IOP intraocular pressure; BRVO Branch retinal vein occlusion; CRVO central retinal vein occlusion; CRAO central retinal artery occlusion; BRAO branch retinal artery occlusion; RT retinal tear; SB scleral buckle; PPV pars plana vitrectomy; VH Vitreous hemorrhage; PRP panretinal laser photocoagulation; IVK intravitreal kenalog; VMT vitreomacular traction; MH Macular hole;  NVD neovascularization of the disc; NVE neovascularization elsewhere; AREDS age related eye disease study; ARMD age related macular degeneration; POAG primary open angle glaucoma; EBMD epithelial/anterior basement membrane dystrophy; ACIOL anterior chamber intraocular lens; IOL intraocular lens; PCIOL posterior chamber intraocular lens; Phaco/IOL phacoemulsification with intraocular lens placement; [place] photorefractive keratectomy; LASIK laser assisted in situ keratomileusis; HTN hypertension; DM diabetes mellitus; COPD chronic obstructive pulmonary disease

## 2022-02-05 ENCOUNTER — Ambulatory Visit (INDEPENDENT_AMBULATORY_CARE_PROVIDER_SITE_OTHER): Payer: Medicaid Other | Admitting: Ophthalmology

## 2022-02-05 ENCOUNTER — Encounter (INDEPENDENT_AMBULATORY_CARE_PROVIDER_SITE_OTHER): Payer: Self-pay | Admitting: Ophthalmology

## 2022-02-05 DIAGNOSIS — H35033 Hypertensive retinopathy, bilateral: Secondary | ICD-10-CM

## 2022-02-05 DIAGNOSIS — I10 Essential (primary) hypertension: Secondary | ICD-10-CM

## 2022-02-05 DIAGNOSIS — H04123 Dry eye syndrome of bilateral lacrimal glands: Secondary | ICD-10-CM

## 2022-02-05 DIAGNOSIS — E113313 Type 2 diabetes mellitus with moderate nonproliferative diabetic retinopathy with macular edema, bilateral: Secondary | ICD-10-CM

## 2022-02-05 DIAGNOSIS — H2513 Age-related nuclear cataract, bilateral: Secondary | ICD-10-CM

## 2022-02-05 MED ORDER — BEVACIZUMAB CHEMO INJECTION 1.25MG/0.05ML SYRINGE FOR KALEIDOSCOPE
1.2500 mg | INTRAVITREAL | Status: AC | PRN
  Administered 2022-02-05: 1.25 mg via INTRAVITREAL

## 2022-03-10 ENCOUNTER — Ambulatory Visit: Payer: Medicaid Other | Admitting: Family Medicine

## 2022-03-10 VITALS — BP 130/65 | Ht 62.0 in | Wt 309.0 lb

## 2022-03-10 DIAGNOSIS — G8929 Other chronic pain: Secondary | ICD-10-CM | POA: Diagnosis not present

## 2022-03-10 DIAGNOSIS — M79641 Pain in right hand: Secondary | ICD-10-CM

## 2022-03-10 DIAGNOSIS — M25561 Pain in right knee: Secondary | ICD-10-CM

## 2022-03-10 MED ORDER — METHYLPREDNISOLONE ACETATE 40 MG/ML IJ SUSP
40.0000 mg | Freq: Once | INTRAMUSCULAR | Status: AC
  Administered 2022-03-10: 40 mg via INTRA_ARTICULAR

## 2022-03-10 MED ORDER — SULFAMETHOXAZOLE-TRIMETHOPRIM 800-160 MG PO TABS: 1.0000 | ORAL_TABLET | Freq: Two times a day (BID) | ORAL | 0 refills | Status: AC

## 2022-03-10 NOTE — Progress Notes (Signed)
[facility] Note  03/12/2022     CHIEF COMPLAINT Patient presents for Retina Follow Up  HISTORY OF PRESENT ILLNESS: Ana Long is a 47 y.o. female who presents to the clinic today for:   HPI     Retina Follow Up   Patient presents with  Diabetic Retinopathy.  In both eyes.  This started 4 weeks ago.  Duration of 4 weeks.  Since onset it is gradually worsening.  I, the attending physician,  performed the HPI with the patient and updated documentation appropriately.        Comments   4 week retina follow up NPDR and IVA OU pt states her vision fluctuates at times her last reading she is unsure what the number was A1C 8.6      Last edited by Bernarda Caffey, MD on 03/12/2022  4:49 PM.    Pt states her vision comes and goes, she doesn't feel like it's getting any better, pt just got bilateral cortisone injections in her shoulders and one in her knee, she states it made her sugar spike (up to 442)  Referring physician: [name] MD [place], [place] [address]  HISTORICAL INFORMATION:   Selected notes from the [facility] Re-referred by Dr. [name] for DM exam   CURRENT MEDICATIONS: Current Outpatient Medications (Ophthalmic Drugs)  Medication Sig   Olopatadine HCl 0.2 % SOLN Place one drop into both eyes daily.   No current facility-administered medications for this visit. (Ophthalmic Drugs)   Current Outpatient Medications (Other)  Medication Sig   acyclovir (ZOVIRAX) 400 MG tablet TAKE 1 TABLET BY MOUTH TWICE A DAY   albuterol (PROAIR HFA) 108 (90 Base) MCG/ACT inhaler Inhale 2 puffs into the lungs every 4 (four) hours as needed for wheezing or shortness of breath (cough). (Patient taking differently: Inhale 2 puffs into the lungs every 4 (four) hours as needed for wheezing or shortness of breath (or coughing).)   atorvastatin (LIPITOR) 10 MG tablet Take 10 mg by mouth daily.   benzonatate (TESSALON) 200  MG capsule Take 1 capsule (200 mg total) by mouth 3 (three) times daily as needed for cough.   Blood Glucose Monitoring Suppl (ACCU-CHEK AVIVA PLUS) w/Device KIT Please check you blood sugars 4 times a day   CIPRODEX OTIC suspension Place into the right ear.   DERMA-SMOOTHE/FS SCALP 0.01 % OIL Apply topically.   fluconazole (DIFLUCAN) 150 MG tablet TAKE 1 TABLET BY MOUTH NOW. THEN REPEAT IN 1 WEEK   furosemide (LASIX) 20 MG tablet Take 20 mg by mouth daily as needed for fluid or edema.   glucose blood (ACCU-CHEK SMARTVIEW) test strip TEST FOUR TIMES DAILY   HUMULIN R U-500 KWIKPEN 500 UNIT/ML kwikpen Inject into the skin.   hydrochlorothiazide (HYDRODIURIL) 12.5 MG tablet Take by mouth.   Insulin Pen Needle (B-D UF III MINI PEN NEEDLES) 31G X 5 MM MISC USE AS DIRECTED TO INJECT INSULIN FIVE A DAY E11.65   lactulose (CEPHULAC) 10 g packet Take 1 packet (10 g total) by mouth daily as needed. Reported on 07/29/2015 (Patient taking differently: Take 10 g by mouth daily as needed (for constipation).)   Lancets (ACCU-CHEK SOFT TOUCH) lancets Use as instructed   loratadine (CLARITIN) 10 MG tablet Take 1 tablet (10 mg total) by mouth 2 (two) times daily.   methocarbamol (ROBAXIN) 500 MG tablet Take 1 tablet (500 mg total) by mouth every 8 (eight) hours as needed.  mometasone-formoterol (DULERA) 200-5 MCG/ACT AERO Inhale 2 puffs into the lungs 2 (two) times daily.   montelukast (SINGULAIR) 10 MG tablet Take 1 tablet (10 mg total) by mouth at bedtime.   nitroGLYCERIN (NITRODUR - DOSED IN MG/24 HR) 0.2 mg/hr patch Apply 1/4th patch to affected achilles, change daily   omeprazole (PRILOSEC) 20 MG capsule Take 1 capsule (20 mg total) by mouth daily before breakfast.   SPIRIVA RESPIMAT 1.25 MCG/ACT AERS SMARTSIG:2 Puff(s) Via Inhaler Daily   sulfamethoxazole-trimethoprim (BACTRIM DS) 800-160 MG tablet Take 1 tablet by mouth 2 (two) times daily.   topiramate (TOPAMAX) 25 MG tablet Take by mouth.   Current  Facility-Administered Medications (Other)  Medication Route   diphenhydrAMINE (BENADRYL) injection 12.5 mg Intramuscular   REVIEW OF SYSTEMS: ROS   Positive for: Endocrine, Eyes, Respiratory Negative for: Constitutional, Gastrointestinal, Neurological, Skin, Genitourinary, Musculoskeletal, HENT, Cardiovascular, Psychiatric, Allergic/Imm, Heme/Lymph Last edited by Parthenia Ames, COT on 03/12/2022  1:23 PM.     ALLERGIES Allergies  Allergen Reactions   Bee Venom Anaphylaxis and Swelling   Wasp Venom Anaphylaxis and Swelling   Citrus Hives and Itching   Latex Hives and Swelling   Penicillins Hives and Itching    Has patient had a PCN reaction causing immediate rash, facial/tongue/throat swelling, SOB or lightheadedness with hypotension: Yes Has patient had a PCN reaction causing severe rash involving mucus membranes or skin necrosis: Unk Has patient had a PCN reaction that required hospitalization: Was already in hosp Has patient had a PCN reaction occurring within the last 10 years: No If all of the above answers are "NO", then may proceed with Cephalosporin use.    Shellfish Allergy Other (See Comments) and Swelling    Patient is uncertain; stated that she tolerates this (??)   Soap Hives, Itching and Swelling    PUREX LAUNDRY DETERGENT   Tomato Hives and Itching   Banana Itching and Nausea Only   Chocolate Itching   Fluticasone Other (See Comments)    Per patient, makes sneeze and gag   Hydrocodone Itching and Nausea Only   Other Itching    Acidic foods- Itching [place]- Itching in the roof of the mouth   Adhesive [Tape] Itching and Rash    Allergic to  "leads"   Doxycycline Other (See Comments)    AFFECTS JOINTS   Liraglutide Nausea Only   Metformin And Related Nausea Only, Rash, Other (See Comments) and Diarrhea    GI UPSET, also   Red Dye Itching, Rash and Other (See Comments)    At eye doctor's office, the dilating drops- Skin breaks out, chest turned red,  itching, face became swollen   PAST MEDICAL HISTORY 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   Cataract    NS OU   Complication of anesthesia    states is hard to wake up post-op   Constipation 12/30/2014   Diabetes mellitus    intolerant to Metformin   Diabetic retinopathy (HCC)    NPDR OU   Dry skin    hands    Family history of anesthesia complication    mother went into coma during c-section   Genital herpes 01/01/2006   on acyclovir BID    GERD (gastroesophageal reflux  disease)    Herpes genital    Hyperglycemia 12/26/2017   Hypertensive retinopathy    OU   Low back pain 11/22/2012   Lower extremity edema 10/18/2012   Menorrhagia 11/06/2015   Morbid obesity with BMI of 50 to 60 07/23/2009   Obesity hypoventilation syndrome ([place]) 10/30/2012   Obesity, morbid ([place]) 01/10/2014   OSA on CPAP     AHI was on  11-17-12 to 120/hr, titrated to 15 cm with 3 cm EPR/    PCOS (polycystic ovarian syndrome)    Rheumatoid arthritis ([place])    Right ankle pain 06/19/2015   Right hip pain 09/04/2017   Right shoulder pain 06/01/2017   Seasonal allergies    Sleep apnea, obstructive    uses CPAP nightly - had sleep study 08/22/2007   Past Surgical History:  Procedure Laterality Date   CARPAL TUNNEL RELEASE Right 10/02/2012   Procedure: RIGHT CARPAL TUNNEL RELEASE ENDOSCOPIC;  Surgeon: Jolyn Nap, MD;  Location: [facility];  Service: Orthopedics;  Laterality: Right;   CHOLECYSTECTOMY N/A 12/02/2014   Procedure: LAPAROSCOPIC CHOLECYSTECTOMY;  Surgeon: Armandina Gemma, MD;  Location: [facility];  Service: General;  Laterality: N/A;   HYSTEROSCOPY WITH D & C  01-08-2002   FAMILY HISTORY Family History  Problem Relation Age of Onset   Other  Mother    Diabetes Maternal Aunt    Heart attack Maternal Aunt    Alzheimer's disease Maternal Uncle    Cancer Maternal Uncle        prostate   Diabetes Paternal Aunt    Cancer Paternal Aunt        brain,mouth   Alzheimer's disease Paternal Aunt    Colon polyps Paternal Uncle    Diabetes Paternal Uncle    Diabetes Maternal Grandmother    Diabetes Maternal Grandfather    Leukemia Cousin    Other Other    Obesity Other    Other Other    Colitis Neg Hx    SOCIAL HISTORY Social History   Tobacco Use   Smoking status: Former    Years: 10.00    Types: Cigarettes    Quit date: 05/24/2010    Years since quitting: 11.8   Smokeless tobacco: Never  Vaping Use   Vaping Use: Never used  Substance Use Topics   Alcohol use: Yes    Alcohol/week: 0.0 standard drinks of alcohol    Comment: socially   Drug use: No       OPHTHALMIC EXAM: Base Eye Exam     Visual Acuity (Snellen - Linear)       Right Left   Dist cc 20/30 -2 20/40 -2   Dist ph cc 20/25 -2 20/25 -1    Correction: Glasses         Tonometry (Tonopen, 1:29 PM)       Right Left   Pressure 16 14         Pupils       Pupils Dark Light Shape React APD   Right PERRL 3 2 Round Minimal None   Left PERRL 3 2 Round Minimal None         Visual Fields       Left Right    Full Full         Extraocular Movement       Right Left    Full, Ortho Full, Ortho         Neuro/Psych     Oriented x3: Yes  Mood/Affect: Normal         Dilation     Both eyes: 2.5% Phenylephrine @ 1:29 PM           Slit Lamp and Fundus Exam     Slit Lamp Exam       Right Left   Lids/Lashes Dermatochalasis - upper lid, mild Meibomian gland dysfunction Dermatochalasis - upper lid, mild Meibomian gland dysfunction   Conjunctiva/Sclera White and quiet White and quiet   Cornea 2+Punctate epithelial erosions, mild tear film debris 3+fine Punctate epithelial erosions   Anterior Chamber deep and quiet deep and quiet    Iris round and dilated, No NVI round and dilated   Lens 1+ NS, 1+CC 1+NS, 1+CC   Anterior Vitreous mild vitreous syneresis mild vitreous syneresis, scattered vitreous condensations         Fundus Exam       Right Left   Disc Pink and sharp, +cupping Pink and sharp, +cupping   C/D Ratio 0.7 0.6   Macula good foveal reflex, focal exudates ST macula, scattered MA, focal cystic changes ST macula - slightly improved, focal laser changes inferior macula, no great targets for focal laser good foveal reflex, scatttered MA's and exudate, central cystic changes -- slightly increased, focal cluster of exudates SN macula -  slightly improved   Vessels attenuated, Tortuous attenuated, Tortuous   Periphery Attached, scattered MA/exudates greatest nasally Attached, scattered MA and exudate greatest superior to disc, white without pressure temporally           Refraction     Wearing Rx       Sphere Cylinder Axis   Right -2.50 +1.50 013   Left -2.25 Sphere     Type: SVL           IMAGING AND PROCEDURES  Imaging and Procedures for _0 @  OCT, Retina - OU - Both Eyes       Right Eye Quality was good. Central Foveal Thickness: 221. Progression has improved. Findings include normal foveal contour, no SRF, intraretinal hyper-reflective material, intraretinal fluid, vitreomacular adhesion (Mild interval improvement IRF/IRHM temporal macula -- just mild cystic changes remain).   Left Eye Quality was good. Central Foveal Thickness: 277. Progression has worsened. Findings include no SRF, abnormal foveal contour, intraretinal hyper-reflective material, intraretinal fluid (Mild interval increase in IRF centrally, persistent cystic changes SN mac).   Notes *Images captured and stored on drive  Diagnosis / Impression:  +DME OU OD: Mild interval improvement IRF/IRHM temporal macula -- just mild cystic changes remain OS: Mild interval increase in IRF centrally, persistent cystic changes SN  mac  Clinical management:  See below  Abbreviations: NFP - Normal foveal profile. CME - cystoid macular edema. PED - pigment epithelial detachment. IRF - intraretinal fluid. SRF - subretinal fluid. EZ - ellipsoid zone. ERM - epiretinal membrane. ORA - outer retinal atrophy. ORT - outer retinal tubulation. SRHM - subretinal hyper-reflective material      Intravitreal Injection, Pharmacologic Agent - OD - Right Eye       Time Out 03/12/2022. 1:58 PM. Confirmed correct patient, procedure, site, and patient consented.   Anesthesia Topical anesthesia was used. Anesthetic medications included Lidocaine 2%, Proparacaine 0.5%.   Procedure Preparation included 5% betadine to ocular surface, eyelid speculum. A (32g) needle was used.   Injection: 1.25 mg Bevacizumab 1.24m/0.05ml   Route: Intravitreal, Site: Right Eye   NDC: 50242-060-01, Lot:: [id number] Expiration date: 04/23/2022   Post-op Post injection exam found visual acuity of at least  counting fingers. The patient tolerated the procedure well. There were no complications. The patient received written and verbal post procedure care education. Post injection medications were not given.      Intravitreal Injection, Pharmacologic Agent - OS - Left Eye       Time Out 03/12/2022. 1:58 PM. Confirmed correct patient, procedure, site, and patient consented.   Anesthesia Topical anesthesia was used. Anesthetic medications included Lidocaine 2%, Proparacaine 0.5%.   Procedure Preparation included 5% betadine to ocular surface, eyelid speculum. A (32g) needle was used.   Injection: 1.25 mg Bevacizumab 1.69m/0.05ml   Route: Intravitreal, Site: Left Eye   NDC:: 01093-235-57 Lot:: [id number] Expiration date: 03/23/2022   Post-op Post injection exam found visual acuity of at least counting fingers. The patient tolerated the procedure well. There were no complications. The patient received written and verbal post procedure care education. Post  injection medications were not given.            ASSESSMENT/PLAN:    ICD-10-CM   1. Moderate nonproliferative diabetic retinopathy of both eyes with macular edema associated with type 2 diabetes mellitus (HCC)  E11.3313 OCT, Retina - OU - Both Eyes    Intravitreal Injection, Pharmacologic Agent - OD - Right Eye    Intravitreal Injection, Pharmacologic Agent - OS - Left Eye    Bevacizumab (AVASTIN) SOLN 1.25 mg    Bevacizumab (AVASTIN) SOLN 1.25 mg    2. Essential hypertension  I10     3. Hypertensive retinopathy of both eyes  H35.033     4. Nuclear sclerosis of both eyes  H25.13     5. Dry eyes  H04.123      1.  Moderate non-proliferative diabetic retinopathy w/ DME, OU  - pt lost to f/u from 10.23.2019 to 11.18.20  - FA in 2019 showed leaking MA OU; no NV OU **of note, pt had pruritic rxn to fluorescein dye -- resolved with 12.5 mg IV benadryl**  - FA 12.16.2020 shows late leaking MA's, non-perfusion defects; no NV OU - s/p IVA OD #1 (12.16.20), #2 (01.25.21), #3 (05.26.21), #4 (07.02.21), #5 (10.15.21), #6 (11.23.21), #7 (07.27.22), #8 (09.09.22), #9 (11.11.22), #10 (01.20.23), #11 (02.23.23), #12 (04.09.23), #13 (05.23.23), #14 (06.30.23), #15 (08.04.23), #16 (09.01.23), #17 (10.06.23), #18 (11.10.23) #19 (12.15.23)  - s/p focal laser OD (03.19.21), (04.01.22) - s/p IVA OS #1 (12.23.20), #2 (01.27.21), #3 (03.02.21), #4 (05.26.21), #5 (07.02.21), #6 (10.15.21), #7 (11.23.21), #8 (01.11.22), #9 (03.04.22), #10 (04.27.22), #11 (11.14.22), #12 (01.10.23), #13 (02.20.23), #14 (04.05.23), #15 (05.19.23), #16 (06.23.23), #17 (07.28.23), #18 (09.01.23), #19 (10.06.23), #20 (11.10.23) #21 (12.15.23)  - exam shows scattered MA, IRH, exudates and mild macular edema OU  - BCVA 20/25 OU --stable - OCT shows OD: Mild interval improvement IRF/IRHM temporal macula -- just mild cystic changes remain; OS: Mild interval increase in IRF centrally, persistent cystic changes SN mac at 5 weeks -  Recommend IVA OU (OD #20 and OS #22) today, 01.19.24 w/ f/u in 4-5 wks  - pt wishes to proceed with injections - RBA of procedure discussed, questions answered - see procedure note  - Avastin informed consent form re-signed and scanned on 05.19.2023  - f/u 4-5 wks -- DFE, OCT, possible injection(s)  2,3. Hypertensive retinopathy OU  - discussed importance of tight BP control             - continue to monitor   4. Nuclear Sclerosis  - The symptoms of cataract, surgical options, and treatments and risks were discussed with patient.   -  discussed diagnosis and progression  - under the expert management of [facility]   5. Dry eyes OU  - likely cause of decreased vision OU today - recommend artificial tears and lubricating ointment as needed  Ophthalmic Meds Ordered this visit:  Meds ordered this encounter  Medications   Bevacizumab (AVASTIN) SOLN 1.25 mg   Bevacizumab (AVASTIN) SOLN 1.25 mg    This document serves as a record of services personally performed by Gardiner Sleeper, MD, PhD. It was created on their behalf by Bernarda Caffey, MD, an ophthalmic technician. The creation of this record is the provider's dictation and/or activities during the visit.    Electronically signed by: Bernarda Caffey, MD 01/17/202410:48 AM  Gardiner Sleeper, M.D., Ph.D. Diseases & Surgery of the Retina and [place] 03/12/2022   I have reviewed the above documentation for accuracy and completeness, and I agree with the above. Gardiner Sleeper, M.D., Ph.D. 03/13/22 10:50 AM  Abbreviations: M myopia (nearsighted); A astigmatism; H hyperopia (farsighted); P presbyopia; Mrx spectacle prescription;  CTL contact lenses; OD right eye; OS left eye; OU both eyes  XT exotropia; ET esotropia; PEK punctate epithelial keratitis; PEE punctate epithelial erosions; DES dry eye syndrome; MGD meibomian gland dysfunction; ATs artificial tears; PFAT's preservative free artificial tears;  [place] nuclear sclerotic cataract; PSC posterior subcapsular cataract; ERM epi-retinal membrane; PVD posterior vitreous detachment; RD retinal detachment; DM diabetes mellitus; DR diabetic retinopathy; NPDR non-proliferative diabetic retinopathy; PDR proliferative diabetic retinopathy; CSME clinically significant macular edema; DME diabetic macular edema; dbh dot blot hemorrhages; CWS cotton wool spot; POAG primary open angle glaucoma; C/D cup-to-disc ratio; HVF humphrey visual field; GVF goldmann visual field; OCT optical coherence tomography; IOP intraocular pressure; BRVO Branch retinal vein occlusion; CRVO central retinal vein occlusion; CRAO central retinal artery occlusion; BRAO branch retinal artery occlusion; RT retinal tear; SB scleral buckle; PPV pars plana vitrectomy; VH Vitreous hemorrhage; PRP panretinal laser photocoagulation; IVK intravitreal kenalog; VMT vitreomacular traction; MH Macular hole;  NVD neovascularization of the disc; NVE neovascularization elsewhere; AREDS age related eye disease study; ARMD age related macular degeneration; POAG primary open angle glaucoma; EBMD epithelial/anterior basement membrane dystrophy; ACIOL anterior chamber intraocular lens; IOL intraocular lens; PCIOL posterior chamber intraocular lens; Phaco/IOL phacoemulsification with intraocular lens placement; [place] photorefractive keratectomy; LASIK laser assisted in situ keratomileusis; HTN hypertension; DM diabetes mellitus; COPD chronic obstructive pulmonary disease

## 2022-03-11 NOTE — Progress Notes (Signed)
PCP: Willeen Niece, PA  Subjective:   HPI: Patient is a 47 y.o. Long here for right knee, right 2nd digit pain.  Patient continues to have pain right index finger worse with motion. Feels all the way around about MCP joint. Saw dermatology - did not think she had psoriasis and given aquaphor. Radiographs did not show erosions. Also with medial right knee pain worsening recently. No new injury here. Worse with ambulation.  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   Cataract    NS OU   Complication of anesthesia    states is hard to wake up post-op   Constipation 12/30/2014   Diabetes mellitus    intolerant to Metformin   Diabetic retinopathy ([place])    NPDR OU   Dry skin    hands    Family history of anesthesia complication    mother went into coma during c-section   Genital herpes 01/01/2006   on acyclovir BID    GERD (gastroesophageal reflux disease)    Herpes genital    Hyperglycemia 12/26/2017   Hypertensive retinopathy    OU   Low back pain 11/22/2012   Lower extremity edema 10/18/2012   Menorrhagia 11/06/2015   Morbid obesity with BMI of 50 to 60 07/23/2009   Obesity hypoventilation syndrome ([place]) 10/30/2012   Obesity, morbid ([place]) 01/10/2014   OSA on CPAP     AHI was on  11-17-12 to 120/hr, titrated to 15 cm with 3 cm EPR/    PCOS (polycystic ovarian syndrome)    Rheumatoid arthritis ([place])    Right ankle pain 06/19/2015   Right hip pain 09/04/2017   Right shoulder pain 06/01/2017   Seasonal allergies    Sleep apnea, obstructive    uses CPAP nightly - had sleep study 08/22/2007    Current Outpatient Medications on File Prior to Visit  Medication Sig Dispense Refill   acyclovir (ZOVIRAX) 400 MG tablet TAKE 1 TABLET BY  MOUTH TWICE A DAY 60 tablet 2   albuterol (PROAIR HFA) 108 (90 Base) MCG/ACT inhaler Inhale 2 puffs into the lungs every 4 (four) hours as needed for wheezing or shortness of breath (cough). (Patient taking differently: Inhale 2 puffs into the lungs every 4 (four) hours as needed for wheezing or shortness of breath (or coughing).) 6.7 g 2   atorvastatin (LIPITOR) 10 MG tablet Take 10 mg by mouth daily.     benzonatate (TESSALON) 200 MG capsule Take 1 capsule (200 mg total) by mouth 3 (three) times daily as needed for cough. 20 capsule 0   Blood Glucose Monitoring Suppl (ACCU-CHEK AVIVA PLUS) w/Device KIT Please check you blood sugars 4 times a day 1 kit 0   CIPRODEX OTIC suspension Place into the right ear.     DERMA-SMOOTHE/FS SCALP 0.01 % OIL Apply topically.     fluconazole (DIFLUCAN) 150 MG tablet TAKE 1 TABLET BY MOUTH NOW. THEN REPEAT IN 1 WEEK     furosemide (LASIX) 20 MG tablet Take 20 mg by mouth daily as needed for fluid or edema.     glucose blood (ACCU-CHEK SMARTVIEW) test strip TEST FOUR TIMES DAILY 150 each 6   HUMULIN R U-500 KWIKPEN 500 UNIT/ML kwikpen Inject into the skin.  hydrochlorothiazide (HYDRODIURIL) 12.5 MG tablet Take by mouth.     Insulin Pen Needle (B-D UF III MINI PEN NEEDLES) 31G X 5 MM MISC USE AS DIRECTED TO INJECT INSULIN FIVE A DAY E11.65 100 each 6   lactulose (CEPHULAC) 10 g packet Take 1 packet (10 g total) by mouth daily as needed. Reported on 07/29/2015 (Patient taking differently: Take 10 g by mouth daily as needed (for constipation).) 30 each 0   Lancets (ACCU-CHEK SOFT TOUCH) lancets Use as instructed 100 each 12   loratadine (CLARITIN) 10 MG tablet Take 1 tablet (10 mg total) by mouth 2 (two) times daily. 180 tablet 1   methocarbamol (ROBAXIN) 500 MG tablet Take 1 tablet (500 mg total) by mouth every 8 (eight) hours as needed. 60 tablet 1   mometasone-formoterol (DULERA) 200-5 MCG/ACT AERO Inhale 2 puffs into the lungs 2 (two) times daily. 13 Inhaler 1    montelukast (SINGULAIR) 10 MG tablet Take 1 tablet (10 mg total) by mouth at bedtime. 90 tablet 1   nitroGLYCERIN (NITRODUR - DOSED IN MG/24 HR) 0.2 mg/hr patch Apply 1/4th patch to affected achilles, change daily 30 patch 1   Olopatadine HCl 0.2 % SOLN Place one drop into both eyes daily.     omeprazole (PRILOSEC) 20 MG capsule Take 1 capsule (20 mg total) by mouth daily before breakfast. 90 capsule 2   SPIRIVA RESPIMAT 1.25 MCG/ACT AERS SMARTSIG:2 Puff(s) Via Inhaler Daily     topiramate (TOPAMAX) 25 MG tablet Take by mouth.     Current Facility-Administered Medications on File Prior to Visit  Medication Dose Route Frequency Provider Last Rate Last Admin   diphenhydrAMINE (BENADRYL) injection 12.5 mg  12.5 mg Intramuscular Once Bernarda Caffey, MD        Past Surgical History:  Procedure Laterality Date   CARPAL TUNNEL RELEASE Right 10/02/2012   Procedure: RIGHT CARPAL TUNNEL RELEASE ENDOSCOPIC;  Surgeon: Jolyn Nap, MD;  Location: [facility];  Service: Orthopedics;  Laterality: Right;   CHOLECYSTECTOMY N/A 12/02/2014   Procedure: LAPAROSCOPIC CHOLECYSTECTOMY;  Surgeon: Armandina Gemma, MD;  Location: [facility];  Service: General;  Laterality: N/A;   HYSTEROSCOPY WITH D & C  01-08-2002    Allergies  Allergen Reactions   Bee Venom Anaphylaxis and Swelling   Wasp Venom Anaphylaxis and Swelling   Citrus Hives and Itching   Latex Hives and Swelling   Penicillins Hives and Itching    Has patient had a PCN reaction causing immediate rash, facial/tongue/throat swelling, SOB or lightheadedness with hypotension: Yes Has patient had a PCN reaction causing severe rash involving mucus membranes or skin necrosis: Unk Has patient had a PCN reaction that required hospitalization: Was already in hosp Has patient had a PCN reaction occurring within the last 10 years: No If all of the above answers are "NO", then may proceed with Cephalosporin use.    Shellfish Allergy Other (See  Comments) and Swelling    Patient is uncertain; stated that she tolerates this (??)   Soap Hives, Itching and Swelling    PUREX LAUNDRY DETERGENT   Tomato Hives and Itching   Banana Itching and Nausea Only   Chocolate Itching   Fluticasone Other (See Comments)    Per patient, makes sneeze and gag   Hydrocodone Itching and Nausea Only   Other Itching    Acidic foods- Itching [place]- Itching in the roof of the mouth   Adhesive [Tape] Itching and Rash    Allergic to  "leads"  Doxycycline Other (See Comments)    AFFECTS JOINTS   Liraglutide Nausea Only   Metformin And Related Nausea Only, Rash, Other (See Comments) and Diarrhea    GI UPSET, also   Red Dye Itching, Rash and Other (See Comments)    At eye doctor's office, the dilating drops- Skin breaks out, chest turned red, itching, face became swollen    BP 130/65   Ht '5\' 2"'$  (1.575 m)   Wt (!) 309 lb (140.2 kg)   BMI 56.52 kg/m       No data to display              No data to display              Objective:  Physical Exam:  Gen: NAD, comfortable in exam room  Right knee: No gross deformity, ecchymoses, swelling. TTP medial joint line. FROM with normal strength. Negative ant/post drawers. Negative valgus/varus testing. Negative lachman.  Negative mcmurrays, apleys.  NV intact distally.  Right hand: No deformity.  Mild swelling about 2nd MCP.  No malrotation or angulation. TTP circumferentially about 2nd MCP.  No other tenderness. Able to flex and extend at PIP, MCP, DIP joints without catching. NVI distally.   Assessment & Plan:  1. Right knee pain - 2/2 arthritis.  Discussed options - intraarticular injection given today.  OTC medications.  After informed written consent timeout was performed, patient was seated on exam table. Right knee was prepped with alcohol swab and utilizing anteromedial approach, patient's right knee was injected intraarticularly with 3:1 lidocaine: depomedrol. Patient  tolerated the procedure well without immediate complications.  2. Right finger pain - 2nd MCP.  Radiographs reassuring against erosive arthropathy.  Icing, relative rest.  She may be going on oral prednisone for her respiratory issues which will likely help with this - she deferred injection for this for now.

## 2022-03-12 ENCOUNTER — Encounter (INDEPENDENT_AMBULATORY_CARE_PROVIDER_SITE_OTHER): Payer: Self-pay | Admitting: Ophthalmology

## 2022-03-12 ENCOUNTER — Ambulatory Visit (INDEPENDENT_AMBULATORY_CARE_PROVIDER_SITE_OTHER): Payer: Medicaid Other | Admitting: Ophthalmology

## 2022-03-12 DIAGNOSIS — H04123 Dry eye syndrome of bilateral lacrimal glands: Secondary | ICD-10-CM

## 2022-03-12 DIAGNOSIS — E113313 Type 2 diabetes mellitus with moderate nonproliferative diabetic retinopathy with macular edema, bilateral: Secondary | ICD-10-CM

## 2022-03-12 DIAGNOSIS — I10 Essential (primary) hypertension: Secondary | ICD-10-CM

## 2022-03-12 DIAGNOSIS — H35033 Hypertensive retinopathy, bilateral: Secondary | ICD-10-CM | POA: Diagnosis not present

## 2022-03-12 DIAGNOSIS — H2513 Age-related nuclear cataract, bilateral: Secondary | ICD-10-CM | POA: Diagnosis not present

## 2022-03-12 MED ORDER — BEVACIZUMAB CHEMO INJECTION 1.25MG/0.05ML SYRINGE FOR KALEIDOSCOPE
1.2500 mg | INTRAVITREAL | Status: AC | PRN
  Administered 2022-03-12: 1.25 mg via INTRAVITREAL

## 2022-04-08 NOTE — Progress Notes (Shared)
[facility] Note  04/09/2022     CHIEF COMPLAINT Patient presents for No chief complaint on file.  HISTORY OF PRESENT ILLNESS: Ana Long is a 47 y.o. female who presents to the clinic today for:    Pt states her vision comes and goes, she doesn't feel like it's getting any better, pt just got bilateral cortisone injections in her shoulders and one in her knee, she states it made her sugar spike (up to 442)  Referring physician: [name] MD [place], [place] [address]  HISTORICAL INFORMATION:   Selected notes from the [facility] Re-referred by Dr. [name] for DM exam   CURRENT MEDICATIONS: Current Outpatient Medications (Ophthalmic Drugs)  Medication Sig   Olopatadine HCl 0.2 % SOLN Place one drop into both eyes daily.   No current facility-administered medications for this visit. (Ophthalmic Drugs)   Current Outpatient Medications (Other)  Medication Sig   acyclovir (ZOVIRAX) 400 MG tablet TAKE 1 TABLET BY MOUTH TWICE A DAY   albuterol (PROAIR HFA) 108 (90 Base) MCG/ACT inhaler Inhale 2 puffs into the lungs every 4 (four) hours as needed for wheezing or shortness of breath (cough). (Patient taking differently: Inhale 2 puffs into the lungs every 4 (four) hours as needed for wheezing or shortness of breath (or coughing).)   atorvastatin (LIPITOR) 10 MG tablet Take 10 mg by mouth daily.   benzonatate (TESSALON) 200 MG capsule Take 1 capsule (200 mg total) by mouth 3 (three) times daily as needed for cough.   Blood Glucose Monitoring Suppl (ACCU-CHEK AVIVA PLUS) w/Device KIT Please check you blood sugars 4 times a day   CIPRODEX OTIC suspension Place into the right ear.   DERMA-SMOOTHE/FS SCALP 0.01 % OIL Apply topically.   fluconazole (DIFLUCAN) 150 MG tablet TAKE 1 TABLET BY MOUTH NOW. THEN REPEAT IN 1 WEEK   furosemide (LASIX) 20 MG tablet Take 20 mg by mouth daily as needed for fluid or edema.   glucose  blood (ACCU-CHEK SMARTVIEW) test strip TEST FOUR TIMES DAILY   HUMULIN R U-500 KWIKPEN 500 UNIT/ML kwikpen Inject into the skin.   hydrochlorothiazide (HYDRODIURIL) 12.5 MG tablet Take by mouth.   Insulin Pen Needle (B-D UF III MINI PEN NEEDLES) 31G X 5 MM MISC USE AS DIRECTED TO INJECT INSULIN FIVE A DAY E11.65   lactulose (CEPHULAC) 10 g packet Take 1 packet (10 g total) by mouth daily as needed. Reported on 07/29/2015 (Patient taking differently: Take 10 g by mouth daily as needed (for constipation).)   Lancets (ACCU-CHEK SOFT TOUCH) lancets Use as instructed   loratadine (CLARITIN) 10 MG tablet Take 1 tablet (10 mg total) by mouth 2 (two) times daily.   methocarbamol (ROBAXIN) 500 MG tablet Take 1 tablet (500 mg total) by mouth every 8 (eight) hours as needed.   mometasone-formoterol (DULERA) 200-5 MCG/ACT AERO Inhale 2 puffs into the lungs 2 (two) times daily.   montelukast (SINGULAIR) 10 MG tablet Take 1 tablet (10 mg total) by mouth at bedtime.   nitroGLYCERIN (NITRODUR - DOSED IN MG/24 HR) 0.2 mg/hr patch Apply 1/4th patch to affected achilles, change daily   omeprazole (PRILOSEC) 20 MG capsule Take 1 capsule (20 mg total) by mouth daily before breakfast.   SPIRIVA RESPIMAT 1.25 MCG/ACT AERS SMARTSIG:2 Puff(s) Via Inhaler Daily   sulfamethoxazole-trimethoprim (BACTRIM DS) 800-160 MG tablet Take 1 tablet by mouth 2 (two) times daily.   topiramate (TOPAMAX) 25 MG tablet  Take by mouth.   Current Facility-Administered Medications (Other)  Medication Route   diphenhydrAMINE (BENADRYL) injection 12.5 mg Intramuscular   REVIEW OF SYSTEMS:   ALLERGIES Allergies  Allergen Reactions   Bee Venom Anaphylaxis and Swelling   Wasp Venom Anaphylaxis and Swelling   Citrus Hives and Itching   Latex Hives and Swelling   Penicillins Hives and Itching    Has patient had a PCN reaction causing immediate rash, facial/tongue/throat swelling, SOB or lightheadedness with hypotension: Yes Has patient had  a PCN reaction causing severe rash involving mucus membranes or skin necrosis: Unk Has patient had a PCN reaction that required hospitalization: Was already in hosp Has patient had a PCN reaction occurring within the last 10 years: No If all of the above answers are "NO", then may proceed with Cephalosporin use.    Shellfish Allergy Other (See Comments) and Swelling    Patient is uncertain; stated that she tolerates this (??)   Soap Hives, Itching and Swelling    PUREX LAUNDRY DETERGENT   Tomato Hives and Itching   Banana Itching and Nausea Only   Chocolate Itching   Fluticasone Other (See Comments)    Per patient, makes sneeze and gag   Hydrocodone Itching and Nausea Only   Other Itching    Acidic foods- Itching [place]- Itching in the roof of the mouth   Adhesive [Tape] Itching and Rash    Allergic to  "leads"   Doxycycline Other (See Comments)    AFFECTS JOINTS   Liraglutide Nausea Only   Metformin And Related Nausea Only, Rash, Other (See Comments) and Diarrhea    GI UPSET, also   Red Dye Itching, Rash and Other (See Comments)    At eye doctor's office, the dilating drops- Skin breaks out, chest turned red, itching, face became swollen   PAST MEDICAL HISTORY 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   Cataract    NS OU   Complication of anesthesia    states is hard to wake up post-op   Constipation 12/30/2014   Diabetes mellitus    intolerant to Metformin   Diabetic retinopathy ([place])    NPDR OU   Dry skin    hands    Family history of anesthesia complication    mother went into coma during c-section   Genital herpes 01/01/2006   on acyclovir BID    GERD (gastroesophageal reflux disease)    Herpes genital    Hyperglycemia  12/26/2017   Hypertensive retinopathy    OU   Low back pain 11/22/2012   Lower extremity edema 10/18/2012   Menorrhagia 11/06/2015   Morbid obesity with BMI of 50 to 60 07/23/2009   Obesity hypoventilation syndrome ([place]) 10/30/2012   Obesity, morbid ([place]) 01/10/2014   OSA on CPAP     AHI was on  11-17-12 to 120/hr, titrated to 15 cm with 3 cm EPR/    PCOS (polycystic ovarian syndrome)    Rheumatoid arthritis ([facility])    Right ankle pain 06/19/2015   Right hip pain 09/04/2017   Right shoulder pain 06/01/2017   Seasonal allergies    Sleep apnea, obstructive    uses CPAP nightly - had sleep study 08/22/2007   Past Surgical History:  Procedure Laterality Date   CARPAL TUNNEL RELEASE Right 10/02/2012   Procedure: RIGHT CARPAL TUNNEL RELEASE ENDOSCOPIC;  Surgeon: Jolyn Nap, MD;  Location: [facility];  Service: Orthopedics;  Laterality: Right;   CHOLECYSTECTOMY N/A 12/02/2014   Procedure: LAPAROSCOPIC CHOLECYSTECTOMY;  Surgeon: Armandina Gemma, MD;  Location: [facility];  Service: General;  Laterality: N/A;   HYSTEROSCOPY WITH D & C  01-08-2002   FAMILY HISTORY Family History  Problem Relation Age of Onset   Other Mother    Diabetes Maternal Aunt    Heart attack Maternal Aunt    Alzheimer's disease Maternal Uncle    Cancer Maternal Uncle        prostate   Diabetes Paternal Aunt    Cancer Paternal Aunt        brain,mouth   Alzheimer's disease Paternal Aunt    Colon polyps Paternal Uncle    Diabetes Paternal Uncle    Diabetes Maternal Grandmother    Diabetes Maternal Grandfather    Leukemia Cousin    Other Other    Obesity Other    Other Other    Colitis Neg Hx    SOCIAL HISTORY Social History   Tobacco Use   Smoking status: Former    Years: 10.00    Types: Cigarettes    Quit date: 05/24/2010    Years since quitting: 11.8   Smokeless tobacco: Never  Vaping Use   Vaping Use: Never used  Substance Use Topics   Alcohol use: Yes    Alcohol/week: 0.0 standard drinks of  alcohol    Comment: socially   Drug use: No       OPHTHALMIC EXAM: Not recorded    IMAGING AND PROCEDURES  Imaging and Procedures for @TODAY$ @          ASSESSMENT/PLAN:  No diagnosis found.  1.  Moderate non-proliferative diabetic retinopathy w/ DME, OU  - pt lost to f/u from 10.23.2019 to 11.18.20  - FA in 2019 showed leaking MA OU; no NV OU **of note, pt had pruritic rxn to fluorescein dye -- resolved with 12.5 mg IV benadryl**  - FA 12.16.2020 shows late leaking MA's, non-perfusion defects; no NV OU - s/p IVA OD #1 (12.16.20), #2 (01.25.21), #3 (05.26.21), #4 (07.02.21), #5 (10.15.21), #6 (11.23.21), #7 (07.27.22), #8 (09.09.22), #9 (11.11.22), #10 (01.20.23), #11 (02.23.23), #12 (04.09.23), #13 (05.23.23), #14 (06.30.23), #15 (08.04.23), #16 (09.01.23), #17 (10.06.23), #18 (11.10.23) #19 (12.15.23) #20 (03/12/2022)  - s/p focal laser OD (03.19.21), (04.01.22) - s/p IVA OS #1 (12.23.20), #2 (01.27.21), #3 (03.02.21), #4 (05.26.21), #5 (07.02.21), #6 (10.15.21), #7 (11.23.21), #8 (01.11.22), #9 (03.04.22), #10 (04.27.22), #11 (11.14.22), #12 (01.10.23), #13 (02.20.23), #14 (04.05.23), #15 (05.19.23), #16 (06.23.23), #17 (07.28.23), #18 (09.01.23), #19 (10.06.23), #20 (11.10.23) #21 (12.15.23) #22(03/12/2022)  - exam shows scattered MA, IRH, exudates and mild macular edema OU  - BCVA 20/25 OU --stable - OCT shows OD: Mild interval improvement IRF/IRHM temporal macula -- just mild cystic changes remain; OS: Mild interval increase in IRF centrally, persistent cystic changes SN mac at 5 weeks - Recommend IVA OU (OD #20 and OS #23) today, 01.19.24 w/ f/u in 4-5 wks  - pt wishes to proceed with injections - RBA of procedure discussed, questions answered - see procedure note  - [place] informed consent form re-signed and scanned on 05.19.2023  - f/u 4-5 wks -- DFE, OCT, possible injection(s)  2,3. Hypertensive retinopathy OU  - discussed importance of tight BP control             -  continue to monitor   4. Nuclear Sclerosis  - The symptoms of cataract, surgical options, and treatments and risks were discussed with patient.   - discussed diagnosis and progression  -  under the expert management of [place]  5. Dry eyes OU  - likely cause of decreased vision OU today - recommend artificial tears and lubricating ointment as needed  Ophthalmic Meds Ordered this visit:  No orders of the defined types were placed in this encounter.     Electronically signed by: Joetta Manners COT 04/08/2022 9:30 AM  Abbreviations: M myopia (nearsighted); A astigmatism; H hyperopia (farsighted); P presbyopia; Mrx spectacle prescription;  CTL contact lenses; OD right eye; OS left eye; OU both eyes  XT exotropia; ET esotropia; PEK punctate epithelial keratitis; PEE punctate epithelial erosions; DES dry eye syndrome; MGD meibomian gland dysfunction; ATs artificial tears; PFAT's preservative free artificial tears; [place] nuclear sclerotic cataract; PSC posterior subcapsular cataract; ERM epi-retinal membrane; PVD posterior vitreous detachment; RD retinal detachment; DM diabetes mellitus; DR diabetic retinopathy; NPDR non-proliferative diabetic retinopathy; PDR proliferative diabetic retinopathy; CSME clinically significant macular edema; DME diabetic macular edema; dbh dot blot hemorrhages; CWS cotton wool spot; POAG primary open angle glaucoma; C/D cup-to-disc ratio; HVF humphrey visual field; GVF goldmann visual field; OCT optical coherence tomography; IOP intraocular pressure; BRVO Branch retinal vein occlusion; CRVO central retinal vein occlusion; CRAO central retinal artery occlusion; BRAO branch retinal artery occlusion; RT retinal tear; SB scleral buckle; PPV pars plana vitrectomy; VH Vitreous hemorrhage; PRP panretinal laser photocoagulation; IVK intravitreal kenalog; VMT vitreomacular traction; MH Macular hole;  NVD neovascularization of the disc; NVE neovascularization elsewhere;  AREDS age related eye disease study; ARMD age related macular degeneration; POAG primary open angle glaucoma; EBMD epithelial/anterior basement membrane dystrophy; ACIOL anterior chamber intraocular lens; IOL intraocular lens; PCIOL posterior chamber intraocular lens; Phaco/IOL phacoemulsification with intraocular lens placement; [place] photorefractive keratectomy; LASIK laser assisted in situ keratomileusis; HTN hypertension; DM diabetes mellitus; COPD chronic obstructive pulmonary disease

## 2022-04-09 ENCOUNTER — Encounter (INDEPENDENT_AMBULATORY_CARE_PROVIDER_SITE_OTHER): Payer: Medicaid Other | Admitting: Ophthalmology

## 2022-04-09 DIAGNOSIS — E113313 Type 2 diabetes mellitus with moderate nonproliferative diabetic retinopathy with macular edema, bilateral: Secondary | ICD-10-CM

## 2022-04-09 DIAGNOSIS — I10 Essential (primary) hypertension: Secondary | ICD-10-CM

## 2022-04-09 DIAGNOSIS — H35033 Hypertensive retinopathy, bilateral: Secondary | ICD-10-CM

## 2022-04-09 DIAGNOSIS — H04123 Dry eye syndrome of bilateral lacrimal glands: Secondary | ICD-10-CM

## 2022-04-09 DIAGNOSIS — H2513 Age-related nuclear cataract, bilateral: Secondary | ICD-10-CM

## 2022-04-09 NOTE — Progress Notes (Shared)
[facility] Note  04/15/2022     CHIEF COMPLAINT Patient presents for No chief complaint on file.  HISTORY OF PRESENT ILLNESS: Ana Long is a 47 y.o. female who presents to the clinic today for:    Pt states her vision comes and goes, she doesn't feel like it's getting any better, pt just got bilateral cortisone injections in her shoulders and one in her knee, she states it made her sugar spike (up to 442)  Referring physician: [name] MD [place], [place] [address]  HISTORICAL INFORMATION:   Selected notes from the [facility] Re-referred by Dr. [name] for DM exam   CURRENT MEDICATIONS: Current Outpatient Medications (Ophthalmic Drugs)  Medication Sig   Olopatadine HCl 0.2 % SOLN Place one drop into both eyes daily.   No current facility-administered medications for this visit. (Ophthalmic Drugs)   Current Outpatient Medications (Other)  Medication Sig   acyclovir (ZOVIRAX) 400 MG tablet TAKE 1 TABLET BY MOUTH TWICE A DAY   albuterol (PROAIR HFA) 108 (90 Base) MCG/ACT inhaler Inhale 2 puffs into the lungs every 4 (four) hours as needed for wheezing or shortness of breath (cough). (Patient taking differently: Inhale 2 puffs into the lungs every 4 (four) hours as needed for wheezing or shortness of breath (or coughing).)   atorvastatin (LIPITOR) 10 MG tablet Take 10 mg by mouth daily.   benzonatate (TESSALON) 200 MG capsule Take 1 capsule (200 mg total) by mouth 3 (three) times daily as needed for cough.   Blood Glucose Monitoring Suppl (ACCU-CHEK AVIVA PLUS) w/Device KIT Please check you blood sugars 4 times a day   CIPRODEX OTIC suspension Place into the right ear.   DERMA-SMOOTHE/FS SCALP 0.01 % OIL Apply topically.   fluconazole (DIFLUCAN) 150 MG tablet TAKE 1 TABLET BY MOUTH NOW. THEN REPEAT IN 1 WEEK   furosemide (LASIX) 20 MG tablet Take 20 mg by mouth daily as needed for fluid or edema.   glucose  blood (ACCU-CHEK SMARTVIEW) test strip TEST FOUR TIMES DAILY   HUMULIN R U-500 KWIKPEN 500 UNIT/ML kwikpen Inject into the skin.   hydrochlorothiazide (HYDRODIURIL) 12.5 MG tablet Take by mouth.   Insulin Pen Needle (B-D UF III MINI PEN NEEDLES) 31G X 5 MM MISC USE AS DIRECTED TO INJECT INSULIN FIVE A DAY E11.65   lactulose (CEPHULAC) 10 g packet Take 1 packet (10 g total) by mouth daily as needed. Reported on 07/29/2015 (Patient taking differently: Take 10 g by mouth daily as needed (for constipation).)   Lancets (ACCU-CHEK SOFT TOUCH) lancets Use as instructed   loratadine (CLARITIN) 10 MG tablet Take 1 tablet (10 mg total) by mouth 2 (two) times daily.   methocarbamol (ROBAXIN) 500 MG tablet Take 1 tablet (500 mg total) by mouth every 8 (eight) hours as needed.   mometasone-formoterol (DULERA) 200-5 MCG/ACT AERO Inhale 2 puffs into the lungs 2 (two) times daily.   montelukast (SINGULAIR) 10 MG tablet Take 1 tablet (10 mg total) by mouth at bedtime.   nitroGLYCERIN (NITRODUR - DOSED IN MG/24 HR) 0.2 mg/hr patch Apply 1/4th patch to affected achilles, change daily   omeprazole (PRILOSEC) 20 MG capsule Take 1 capsule (20 mg total) by mouth daily before breakfast.   SPIRIVA RESPIMAT 1.25 MCG/ACT AERS SMARTSIG:2 Puff(s) Via Inhaler Daily   sulfamethoxazole-trimethoprim (BACTRIM DS) 800-160 MG tablet Take 1 tablet by mouth 2 (two) times daily.   topiramate (TOPAMAX) 25 MG tablet  Take by mouth.   Current Facility-Administered Medications (Other)  Medication Route   diphenhydrAMINE (BENADRYL) injection 12.5 mg Intramuscular   REVIEW OF SYSTEMS:   ALLERGIES Allergies  Allergen Reactions   Bee Venom Anaphylaxis and Swelling   Wasp Venom Anaphylaxis and Swelling   Citrus Hives and Itching   Latex Hives and Swelling   Penicillins Hives and Itching    Has patient had a PCN reaction causing immediate rash, facial/tongue/throat swelling, SOB or lightheadedness with hypotension: Yes Has patient had  a PCN reaction causing severe rash involving mucus membranes or skin necrosis: Unk Has patient had a PCN reaction that required hospitalization: Was already in hosp Has patient had a PCN reaction occurring within the last 10 years: No If all of the above answers are "NO", then may proceed with Cephalosporin use.    Shellfish Allergy Other (See Comments) and Swelling    Patient is uncertain; stated that she tolerates this (??)   Soap Hives, Itching and Swelling    PUREX LAUNDRY DETERGENT   Tomato Hives and Itching   Banana Itching and Nausea Only   Chocolate Itching   Fluticasone Other (See Comments)    Per patient, makes sneeze and gag   Hydrocodone Itching and Nausea Only   Other Itching    Acidic foods- Itching [place]- Itching in the roof of the mouth   Adhesive [Tape] Itching and Rash    Allergic to  "leads"   Doxycycline Other (See Comments)    AFFECTS JOINTS   Liraglutide Nausea Only   Metformin And Related Nausea Only, Rash, Other (See Comments) and Diarrhea    GI UPSET, also   Red Dye Itching, Rash and Other (See Comments)    At eye doctor's office, the dilating drops- Skin breaks out, chest turned red, itching, face became swollen   PAST MEDICAL HISTORY 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   Cataract    NS OU   Complication of anesthesia    states is hard to wake up post-op   Constipation 12/30/2014   Diabetes mellitus    intolerant to Metformin   Diabetic retinopathy ([place])    NPDR OU   Dry skin    hands    Family history of anesthesia complication    mother went into coma during c-section   Genital herpes 01/01/2006   on acyclovir BID    GERD (gastroesophageal reflux disease)    Herpes genital    Hyperglycemia  12/26/2017   Hypertensive retinopathy    OU   Low back pain 11/22/2012   Lower extremity edema 10/18/2012   Menorrhagia 11/06/2015   Morbid obesity with BMI of 50 to 60 07/23/2009   Obesity hypoventilation syndrome ([place]) 10/30/2012   Obesity, morbid ([place]) 01/10/2014   OSA on CPAP     AHI was on  11-17-12 to 120/hr, titrated to 15 cm with 3 cm EPR/    PCOS (polycystic ovarian syndrome)    Rheumatoid arthritis (HCC)    Right ankle pain 06/19/2015   Right hip pain 09/04/2017   Right shoulder pain 06/01/2017   Seasonal allergies    Sleep apnea, obstructive    uses CPAP nightly - had sleep study 08/22/2007   Past Surgical History:  Procedure Laterality Date   CARPAL TUNNEL RELEASE Right 10/02/2012   Procedure: RIGHT CARPAL TUNNEL RELEASE ENDOSCOPIC;  Surgeon: Jolyn Nap, MD;  Location: [facility];  Service: Orthopedics;  Laterality: Right;   CHOLECYSTECTOMY N/A 12/02/2014   Procedure: LAPAROSCOPIC CHOLECYSTECTOMY;  Surgeon: Armandina Gemma, MD;  Location: [facility];  Service: General;  Laterality: N/A;   HYSTEROSCOPY WITH D & C  01-08-2002   FAMILY HISTORY Family History  Problem Relation Age of Onset   Other Mother    Diabetes Maternal Aunt    Heart attack Maternal Aunt    Alzheimer's disease Maternal Uncle    Cancer Maternal Uncle        prostate   Diabetes Paternal Aunt    Cancer Paternal Aunt        brain,mouth   Alzheimer's disease Paternal Aunt    Colon polyps Paternal Uncle    Diabetes Paternal Uncle    Diabetes Maternal Grandmother    Diabetes Maternal Grandfather    Leukemia Cousin    Other Other    Obesity Other    Other Other    Colitis Neg Hx    SOCIAL HISTORY Social History   Tobacco Use   Smoking status: Former    Years: 10.00    Types: Cigarettes    Quit date: 05/24/2010    Years since quitting: 11.8   Smokeless tobacco: Never  Vaping Use   Vaping Use: Never used  Substance Use Topics   Alcohol use: Yes    Alcohol/week: 0.0 standard drinks of  alcohol    Comment: socially   Drug use: No       OPHTHALMIC EXAM: Not recorded    IMAGING AND PROCEDURES  Imaging and Procedures for @TODAY$ @          ASSESSMENT/PLAN:    ICD-10-CM   1. Moderate nonproliferative diabetic retinopathy of both eyes with macular edema associated with type 2 diabetes mellitus ([place])  [id number]     2. Essential hypertension  I10     3. Hypertensive retinopathy of both eyes  H35.033     4. Nuclear sclerosis of both eyes  H25.13     5. Dry eyes  H04.123      1.  Moderate non-proliferative diabetic retinopathy w/ DME, OU  - pt lost to f/u from 10.23.2019 to 11.18.20  - FA in 2019 showed leaking MA OU; no NV OU **of note, pt had pruritic rxn to fluorescein dye -- resolved with 12.5 mg IV benadryl**  - FA 12.16.2020 shows late leaking MA's, non-perfusion defects; no NV OU - s/p IVA OD #1 (12.16.20), #2 (01.25.21), #3 (05.26.21), #4 (07.02.21), #5 (10.15.21), #6 (11.23.21), #7 (07.27.22), #8 (09.09.22), #9 (11.11.22), #10 (01.20.23), #11 (02.23.23), #12 (04.09.23), #13 (05.23.23), #14 (06.30.23), #15 (08.04.23), #16 (09.01.23), #17 (10.06.23), #18 (11.10.23) #19 (12.15.23), #20 (01.19.24)  - s/p focal laser OD (03.19.21), (04.01.22) - s/p IVA OS #1 (12.23.20), #2 (01.27.21), #3 (03.02.21), #4 (05.26.21), #5 (07.02.21), #6 (10.15.21), #7 (11.23.21), #8 (01.11.22), #9 (03.04.22), #10 (04.27.22), #11 (11.14.22), #12 (01.10.23), #13 (02.20.23), #14 (04.05.23), #15 (05.19.23), #16 (06.23.23), #17 (07.28.23), #18 (09.01.23), #19 (10.06.23), #20 (11.10.23) #21 (12.15.23), #22 (01.19.24)  - exam shows scattered MA, IRH, exudates and mild macular edema OU  - BCVA 20/25 OU --stable - OCT shows OD: Mild interval improvement IRF/IRHM temporal macula -- just mild cystic changes remain; OS: Mild interval increase in IRF centrally, persistent cystic changes SN mac at 5 weeks - Recommend IVA OU (OD #21 and  OS #23) today, 02.22.24 w/ f/u in 4-5 wks  - pt wishes to  proceed with injections - RBA of procedure discussed, questions answered - see procedure note  - [place] informed consent form re-signed and scanned on 05.19.2023  - f/u 4-5 wks -- DFE, OCT, possible injection(s)  2,3. Hypertensive retinopathy OU  - discussed importance of tight BP control             - continue to monitor   4. Nuclear Sclerosis  - The symptoms of cataract, surgical options, and treatments and risks were discussed with patient.   - discussed diagnosis and progression  - under the expert management of [facility]   5. Dry eyes OU  - likely cause of decreased vision OU today - recommend artificial tears and lubricating ointment as needed  Ophthalmic Meds Ordered this visit:  No orders of the defined types were placed in this encounter.   This document serves as a record of services personally performed by Gardiner Sleeper, MD, PhD. It was created on their behalf by San Jetty. Owens Shark, OA an ophthalmic technician. The creation of this record is the provider's dictation and/or activities during the visit.    Electronically signed by: San Jetty. Marguerita Merles 02.16.2024 12:06 PM   Gardiner Sleeper, M.D., Ph.D. Diseases & Surgery of the Retina and Vitreous [facility]: M myopia (nearsighted); A astigmatism; H hyperopia (farsighted); P presbyopia; Mrx spectacle prescription;  CTL contact lenses; OD right eye; OS left eye; OU both eyes  XT exotropia; ET esotropia; PEK punctate epithelial keratitis; PEE punctate epithelial erosions; DES dry eye syndrome; MGD meibomian gland dysfunction; ATs artificial tears; PFAT's preservative free artificial tears; [place] nuclear sclerotic cataract; PSC posterior subcapsular cataract; ERM epi-retinal membrane; PVD posterior vitreous detachment; RD retinal detachment; DM diabetes mellitus; DR diabetic retinopathy; NPDR non-proliferative diabetic retinopathy; PDR proliferative diabetic retinopathy; CSME clinically  significant macular edema; DME diabetic macular edema; dbh dot blot hemorrhages; CWS cotton wool spot; POAG primary open angle glaucoma; C/D cup-to-disc ratio; HVF humphrey visual field; GVF goldmann visual field; OCT optical coherence tomography; IOP intraocular pressure; BRVO Branch retinal vein occlusion; CRVO central retinal vein occlusion; CRAO central retinal artery occlusion; BRAO branch retinal artery occlusion; RT retinal tear; SB scleral buckle; PPV pars plana vitrectomy; VH Vitreous hemorrhage; PRP panretinal laser photocoagulation; IVK intravitreal kenalog; VMT vitreomacular traction; MH Macular hole;  NVD neovascularization of the disc; NVE neovascularization elsewhere; AREDS age related eye disease study; ARMD age related macular degeneration; POAG primary open angle glaucoma; EBMD epithelial/anterior basement membrane dystrophy; ACIOL anterior chamber intraocular lens; IOL intraocular lens; PCIOL posterior chamber intraocular lens; Phaco/IOL phacoemulsification with intraocular lens placement; [place] photorefractive keratectomy; LASIK laser assisted in situ keratomileusis; HTN hypertension; DM diabetes mellitus; COPD chronic obstructive pulmonary disease

## 2022-04-14 NOTE — Progress Notes (Signed)
[facility] - Clinic Note  04/15/2022     CHIEF COMPLAINT Patient presents for Retina Follow Up  HISTORY OF PRESENT ILLNESS: Ana Long is a 47 y.o. female who presents to the clinic today for:   HPI     Retina Follow Up   Patient presents with  Diabetic Retinopathy.  In both eyes.  This started months ago.  Duration of 5 weeks.  Since onset it is gradually worsening.  I, the attending physician,  performed the HPI with the patient and updated documentation appropriately.        Comments   Patient feels that there may have been a change in her vision. She is not using any eye drops at this time. Her blood sugar was 143.       Last edited by Rennis Chris, MD on 04/15/2022 11:26 AM.    Pt states she has been off steroids for about 2 weeks   Referring physician: Olivia Canter MD [address] [place], [place] [address]  HISTORICAL INFORMATION:   Selected notes from the MEDICAL RECORD NUMBER Re-referred by Dr. Fabian Sharp for DM exam   CURRENT MEDICATIONS: Current Outpatient Medications (Ophthalmic Drugs)  Medication Sig   Olopatadine HCl 0.2 % SOLN Place one drop into both eyes daily.   No current facility-administered medications for this visit. (Ophthalmic Drugs)   Current Outpatient Medications (Other)  Medication Sig   acyclovir (ZOVIRAX) 400 MG tablet TAKE 1 TABLET BY MOUTH TWICE A DAY   albuterol (PROAIR HFA) 108 (90 Base) MCG/ACT inhaler Inhale 2 puffs into the lungs every 4 (four) hours as needed for wheezing or shortness of breath (cough). (Patient taking differently: Inhale 2 puffs into the lungs every 4 (four) hours as needed for wheezing or shortness of breath (or coughing).)   atorvastatin (LIPITOR) 10 MG tablet Take 10 mg by mouth daily.   benzonatate (TESSALON) 200 MG capsule Take 1 capsule (200 mg total) by mouth 3 (three) times daily as needed for cough.   Blood Glucose Monitoring Suppl (ACCU-CHEK AVIVA PLUS) w/Device KIT  Please check you blood sugars 4 times a day   CIPRODEX OTIC suspension Place into the right ear.   DERMA-SMOOTHE/FS SCALP 0.01 % OIL Apply topically.   fluconazole (DIFLUCAN) 150 MG tablet TAKE 1 TABLET BY MOUTH NOW. THEN REPEAT IN 1 WEEK   furosemide (LASIX) 20 MG tablet Take 20 mg by mouth daily as needed for fluid or edema.   glucose blood (ACCU-CHEK SMARTVIEW) test strip TEST FOUR TIMES DAILY   HUMULIN R U-500 KWIKPEN 500 UNIT/ML kwikpen Inject into the skin.   hydrochlorothiazide (HYDRODIURIL) 12.5 MG tablet Take by mouth.   Insulin Pen Needle (B-D UF III MINI PEN NEEDLES) 31G X 5 MM MISC USE AS DIRECTED TO INJECT INSULIN FIVE A DAY E11.65   lactulose (CEPHULAC) 10 g packet Take 1 packet (10 g total) by mouth daily as needed. Reported on 07/29/2015 (Patient taking differently: Take 10 g by mouth daily as needed (for constipation).)   Lancets (ACCU-CHEK SOFT TOUCH) lancets Use as instructed   loratadine (CLARITIN) 10 MG tablet Take 1 tablet (10 mg total) by mouth 2 (two) times daily.   methocarbamol (ROBAXIN) 500 MG tablet Take 1 tablet (500 mg total) by mouth every 8 (eight) hours as needed.   mometasone-formoterol (DULERA) 200-5 MCG/ACT AERO Inhale 2 puffs into the lungs 2 (two) times daily.   montelukast (SINGULAIR) 10 MG tablet Take 1 tablet (10 mg  total) by mouth at bedtime.   nitroGLYCERIN (NITRODUR - DOSED IN MG/24 HR) 0.2 mg/hr patch Apply 1/4th patch to affected achilles, change daily   omeprazole (PRILOSEC) 20 MG capsule Take 1 capsule (20 mg total) by mouth daily before breakfast.   SPIRIVA RESPIMAT 1.25 MCG/ACT AERS SMARTSIG:2 Puff(s) Via Inhaler Daily   sulfamethoxazole-trimethoprim (BACTRIM DS) 800-160 MG tablet Take 1 tablet by mouth 2 (two) times daily.   topiramate (TOPAMAX) 25 MG tablet Take by mouth.   Current Facility-Administered Medications (Other)  Medication Route   diphenhydrAMINE (BENADRYL) injection 12.5 mg Intramuscular   REVIEW OF SYSTEMS: ROS   Positive for:  Endocrine, Eyes, Respiratory Negative for: Constitutional, Gastrointestinal, Neurological, Skin, Genitourinary, Musculoskeletal, HENT, Cardiovascular, Psychiatric, Allergic/Imm, Heme/Lymph Last edited by Julieanne Cotton, COT on 04/15/2022 10:09 AM.      ALLERGIES Allergies  Allergen Reactions   Bee Venom Anaphylaxis and Swelling   Wasp Venom Anaphylaxis and Swelling   Citrus Hives and Itching   Latex Hives and Swelling   Penicillins Hives and Itching    Has patient had a PCN reaction causing immediate rash, facial/tongue/throat swelling, SOB or lightheadedness with hypotension: Yes Has patient had a PCN reaction causing severe rash involving mucus membranes or skin necrosis: Unk Has patient had a PCN reaction that required hospitalization: Was already in hosp Has patient had a PCN reaction occurring within the last 10 years: No If all of the above answers are "NO", then may proceed with Cephalosporin use.    Shellfish Allergy Other (See Comments) and Swelling    Patient is uncertain; stated that she tolerates this (??)   Soap Hives, Itching and Swelling    PUREX LAUNDRY DETERGENT   Tomato Hives and Itching   Banana Itching and Nausea Only   Chocolate Itching   Fluticasone Other (See Comments)    Per patient, makes sneeze and gag   Hydrocodone Itching and Nausea Only   Other Itching    Acidic foods- Itching [place]- Itching in the roof of the mouth   Adhesive [Tape] Itching and Rash    Allergic to  "leads"   Doxycycline Other (See Comments)    AFFECTS JOINTS   Liraglutide Nausea Only   Metformin And Related Nausea Only, Rash, Other (See Comments) and Diarrhea    GI UPSET, also   Red Dye Itching, Rash and Other (See Comments)    At eye doctor's office, the dilating drops- Skin breaks out, chest turned red, itching, face became swollen   PAST MEDICAL HISTORY Past Medical History:  Diagnosis Date   Acute nonintractable headache 12/26/2017   Arthritis    major joints    Asthma    prn inhaler   Asthma exacerbation 07/25/2013   Asthma in adult, severe persistent, with acute exacerbation 01/11/2018   Carpal tunnel syndrome of right wrist 09/2012   Cataract    NS OU   Complication of anesthesia    states is hard to wake up post-op   Constipation 12/30/2014   Diabetes mellitus    intolerant to Metformin   Diabetic retinopathy (HCC)    NPDR OU   Dry skin    hands    Family history of anesthesia complication    mother went into coma during c-section   Genital herpes 01/01/2006   on acyclovir BID    GERD (gastroesophageal reflux disease)    Herpes genital    Hyperglycemia 12/26/2017   Hypertensive retinopathy    OU   Low back pain 11/22/2012  Lower extremity edema 10/18/2012   Menorrhagia 11/06/2015   Morbid obesity with BMI of 50 to 60 07/23/2009   Obesity hypoventilation syndrome (HCC) 10/30/2012   Obesity, morbid (HCC) 01/10/2014   OSA on CPAP     AHI was on  11-17-12 to 120/hr, titrated to 15 cm with 3 cm EPR/    PCOS (polycystic ovarian syndrome)    Rheumatoid arthritis (HCC)    Right ankle pain 06/19/2015   Right hip pain 09/04/2017   Right shoulder pain 06/01/2017   Seasonal allergies    Sleep apnea, obstructive    uses CPAP nightly - had sleep study 08/22/2007   Past Surgical History:  Procedure Laterality Date   CARPAL TUNNEL RELEASE Right 10/02/2012   Procedure: RIGHT CARPAL TUNNEL RELEASE ENDOSCOPIC;  Surgeon: Jodi Marble, MD;  Location: [facility];  Service: Orthopedics;  Laterality: Right;   CHOLECYSTECTOMY N/A 12/02/2014   Procedure: LAPAROSCOPIC CHOLECYSTECTOMY;  Surgeon: Darnell Level, MD;  Location: [facility];  Service: General;  Laterality: N/A;   HYSTEROSCOPY WITH D & C  01-08-2002   FAMILY HISTORY Family History  Problem Relation Age of Onset   Other Mother    Diabetes Maternal Aunt    Heart attack Maternal Aunt    Alzheimer's disease Maternal Uncle    Cancer Maternal Uncle        prostate   Diabetes Paternal Aunt     Cancer Paternal Aunt        brain,mouth   Alzheimer's disease Paternal Aunt    Colon polyps Paternal Uncle    Diabetes Paternal Uncle    Diabetes Maternal Grandmother    Diabetes Maternal Grandfather    Leukemia Cousin    Other Other    Obesity Other    Other Other    Colitis Neg Hx    SOCIAL HISTORY Social History   Tobacco Use   Smoking status: Former    Years: 10.00    Types: Cigarettes    Quit date: 05/24/2010    Years since quitting: 11.9   Smokeless tobacco: Never  Vaping Use   Vaping Use: Never used  Substance Use Topics   Alcohol use: Yes    Alcohol/week: 0.0 standard drinks of alcohol    Comment: socially   Drug use: No       OPHTHALMIC EXAM: Base Eye Exam     Visual Acuity (Snellen - Linear)       Right Left   Dist cc 20/30 +1 20/40 -2   Dist ph cc 20/25 20/25    Correction: Glasses         Tonometry (Tonopen, 10:13 AM)       Right Left   Pressure 14 15         Pupils       Dark Light Shape React APD   Right 3 2 Round Minimal None   Left 3 2 Round Minimal None         Visual Fields       Left Right    Full Full         Extraocular Movement       Right Left    Full, Ortho Full, Ortho         Neuro/Psych     Oriented x3: Yes   Mood/Affect: Normal         Dilation     Both eyes: 1.0% Mydriacyl, 2.5% Phenylephrine @ 10:09 AM  Slit Lamp and Fundus Exam     Slit Lamp Exam       Right Left   Lids/Lashes Dermatochalasis - upper lid, mild Meibomian gland dysfunction Dermatochalasis - upper lid, mild Meibomian gland dysfunction   Conjunctiva/Sclera White and quiet White and quiet   Cornea 2+Punctate epithelial erosions, mild tear film debris 3+fine Punctate epithelial erosions   Anterior Chamber deep and quiet deep and quiet   Iris round and dilated, No NVI round and dilated   Lens 1+ NS, 1+CC 1+NS, 1+CC   Anterior Vitreous mild vitreous syneresis mild vitreous syneresis, scattered vitreous  condensations         Fundus Exam       Right Left   Disc Pink and sharp, +cupping Pink and sharp, +cupping   C/D Ratio 0.7 0.6   Macula good foveal reflex, focal exudates ST macula, scattered MA, focal cystic changes ST macula - slightly improved, focal laser changes inferior macula good foveal reflex, scatttered MA's and exudate, central cystic changes -- persistent, focal cluster of exudates SN macula -  slightly improved   Vessels attenuated, Tortuous attenuated, Tortuous   Periphery Attached, scattered MA/exudates greatest nasally Attached, scattered MA and exudate greatest superior to disc, white without pressure temporally           Refraction     Wearing Rx       Sphere Cylinder Axis   Right -2.50 +1.50 013   Left -2.25 Sphere     Type: SVL           IMAGING AND PROCEDURES  Imaging and Procedures for @TODAY @  OCT, Retina - OU - Both Eyes       Right Eye Quality was good. Central Foveal Thickness: 225. Progression has improved. Findings include normal foveal contour, no SRF, intraretinal hyper-reflective material, intraretinal fluid, vitreomacular adhesion (interval improvement IRF/IRHM temporal macula -- just mild cystic changes remain).   Left Eye Quality was good. Central Foveal Thickness: 287. Progression has been stable. Findings include no SRF, abnormal foveal contour, intraretinal hyper-reflective material, intraretinal fluid (Persistent IRF SN fovea and mac).   Notes *Images captured and stored on drive  Diagnosis / Impression:  +DME OU OD: interval improvement IRF/IRHM temporal macula -- just mild cystic changes remain OS: Persistent IRF SN fovea and mac  Clinical management:  See below  Abbreviations: NFP - Normal foveal profile. CME - cystoid macular edema. PED - pigment epithelial detachment. IRF - intraretinal fluid. SRF - subretinal fluid. EZ - ellipsoid zone. ERM - epiretinal membrane. ORA - outer retinal atrophy. ORT - outer retinal  tubulation. SRHM - subretinal hyper-reflective material      Intravitreal Injection, Pharmacologic Agent - OD - Right Eye       Time Out 04/15/2022. 10:49 AM. Confirmed correct patient, procedure, site, and patient consented.   Anesthesia Topical anesthesia was used. Anesthetic medications included Lidocaine 2%, Proparacaine 0.5%.   Procedure Preparation included 5% betadine to ocular surface, eyelid speculum. A supplied (32g) needle was used.   Injection: 1.25 mg Bevacizumab 1.25mg /0.39ml   Route: Intravitreal, Site: Right Eye   NDC: P3213405, Lot: [id number], Expiration date: 06/05/2022   Post-op Post injection exam found visual acuity of at least counting fingers. The patient tolerated the procedure well. There were no complications. The patient received written and verbal post procedure care education. Post injection medications were not given.      Intravitreal Injection, Pharmacologic Agent - OS - Left Eye       Time  Out 04/15/2022. 10:49 AM. Confirmed correct patient, procedure, site, and patient consented.   Anesthesia Topical anesthesia was used. Anesthetic medications included Lidocaine 2%, Proparacaine 0.5%.   Procedure Preparation included 5% betadine to ocular surface, eyelid speculum. A supplied (32g) needle was used.   Injection: 1.25 mg Bevacizumab 1.25mg /0.25ml   Route: Intravitreal, Site: Left Eye   NDC: P3213405, Lot: [id number] , Expiration date: 05/07/2022   Post-op Post injection exam found visual acuity of at least counting fingers. The patient tolerated the procedure well. There were no complications. The patient received written and verbal post procedure care education. Post injection medications were not given.            ASSESSMENT/PLAN:    ICD-10-CM   1. Moderate nonproliferative diabetic retinopathy of both eyes with macular edema associated with type 2 diabetes mellitus (HCC)  E11.3313 OCT, Retina - OU - Both Eyes    Intravitreal  Injection, Pharmacologic Agent - OD - Right Eye    Intravitreal Injection, Pharmacologic Agent - OS - Left Eye    Bevacizumab (AVASTIN) SOLN 1.25 mg    Bevacizumab (AVASTIN) SOLN 1.25 mg    2. Essential hypertension  I10     3. Hypertensive retinopathy of both eyes  H35.033     4. Nuclear sclerosis of both eyes  H25.13     5. Dry eyes  H04.123      1.  Moderate non-proliferative diabetic retinopathy w/ DME, OU  - pt lost to f/u from 10.23.2019 to 11.18.20  - FA in 2019 showed leaking MA OU; no NV OU **of note, pt had pruritic rxn to fluorescein dye -- resolved with 12.5 mg IV benadryl**  - FA 12.16.2020 shows late leaking MA's, non-perfusion defects; no NV OU - s/p IVA OD #1 (12.16.20), #2 (01.25.21), #3 (05.26.21), #4 (07.02.21), #5 (10.15.21), #6 (11.23.21), #7 (07.27.22), #8 (09.09.22), #9 (11.11.22), #10 (01.20.23), #11 (02.23.23), #12 (04.09.23), #13 (05.23.23), #14 (06.30.23), #15 (08.04.23), #16 (09.01.23), #17 (10.06.23), #18 (11.10.23) #19 (12.15.23), #20 (01.19.24)  - s/p focal laser OD (03.19.21), (04.01.22) - s/p IVA OS #1 (12.23.20), #2 (01.27.21), #3 (03.02.21), #4 (05.26.21), #5 (07.02.21), #6 (10.15.21), #7 (11.23.21), #8 (01.11.22), #9 (03.04.22), #10 (04.27.22), #11 (11.14.22), #12 (01.10.23), #13 (02.20.23), #14 (04.05.23), #15 (05.19.23), #16 (06.23.23), #17 (07.28.23), #18 (09.01.23), #19 (10.06.23), #20 (11.10.23) #21 (12.15.23), #22 (01.19.24)  - exam shows scattered MA, IRH, exudates and mild macular edema OU  - BCVA 20/25 OU --stable - OCT shows OD: interval improvement IRF/IRHM temporal macula -- just mild cystic changes remain; OS: persistent IRF SN mac at 5 weeks - Recommend IVA OU (OD #21 and OS #23) today, 02.22.24 w/ f/u in 4-5 wks  - pt wishes to proceed with injections - RBA of procedure discussed, questions answered - see procedure note  - Avastin informed consent form re-signed and scanned on 05.19.2023  - f/u 4-5 wks -- DFE, OCT, possible  injection(s)  2,3. Hypertensive retinopathy OU  - discussed importance of tight BP control             - continue to monitor   4. Nuclear Sclerosis  - The symptoms of cataract, surgical options, and treatments and risks were discussed with patient.   - discussed diagnosis and progression  - under the expert management of [facility]   5. Dry eyes OU  - likely cause of decreased vision OU today - recommend artificial tears and lubricating ointment as needed  Ophthalmic Meds Ordered this visit:  Meds ordered this encounter  Medications   Bevacizumab (AVASTIN) SOLN 1.25 mg   Bevacizumab (AVASTIN) SOLN 1.25 mg    This document serves as a record of services personally performed by Karie Chimera, MD, PhD. It was created on their behalf by Glee Arvin. Manson Passey, OA an ophthalmic technician. The creation of this record is the provider's dictation and/or activities during the visit.    Electronically signed by: Glee Arvin. Manson Passey, [place] 02.21.2024 11:27 AM   Karie Chimera, M.D., Ph.D. Diseases & Surgery of the Retina and Vitreous [facility]  I have reviewed the above documentation for accuracy and completeness, and I agree with the above. Karie Chimera, M.D., Ph.D. 04/15/22 11:28 AM   Abbreviations: M myopia (nearsighted); A astigmatism; H hyperopia (farsighted); P presbyopia; Mrx spectacle prescription;  CTL contact lenses; OD right eye; OS left eye; OU both eyes  XT exotropia; ET esotropia; PEK punctate epithelial keratitis; PEE punctate epithelial erosions; DES dry eye syndrome; MGD meibomian gland dysfunction; ATs artificial tears; PFAT's preservative free artificial tears; NSC nuclear sclerotic cataract; PSC posterior subcapsular cataract; ERM epi-retinal membrane; PVD posterior vitreous detachment; RD retinal detachment; DM diabetes mellitus; DR diabetic retinopathy; NPDR non-proliferative diabetic retinopathy; PDR proliferative diabetic retinopathy; CSME  clinically significant macular edema; DME diabetic macular edema; dbh dot blot hemorrhages; CWS cotton wool spot; POAG primary open angle glaucoma; C/D cup-to-disc ratio; HVF humphrey visual field; GVF goldmann visual field; OCT optical coherence tomography; IOP intraocular pressure; BRVO Branch retinal vein occlusion; CRVO central retinal vein occlusion; CRAO central retinal artery occlusion; BRAO branch retinal artery occlusion; RT retinal tear; SB scleral buckle; PPV pars plana vitrectomy; VH Vitreous hemorrhage; PRP panretinal laser photocoagulation; IVK intravitreal kenalog; VMT vitreomacular traction; MH Macular hole;  NVD neovascularization of the disc; NVE neovascularization elsewhere; AREDS age related eye disease study; ARMD age related macular degeneration; POAG primary open angle glaucoma; EBMD epithelial/anterior basement membrane dystrophy; ACIOL anterior chamber intraocular lens; IOL intraocular lens; PCIOL posterior chamber intraocular lens; Phaco/IOL phacoemulsification with intraocular lens placement; PRK photorefractive keratectomy; LASIK laser assisted in situ keratomileusis; HTN hypertension; DM diabetes mellitus; COPD chronic obstructive pulmonary disease

## 2022-04-15 ENCOUNTER — Encounter (INDEPENDENT_AMBULATORY_CARE_PROVIDER_SITE_OTHER): Payer: Medicaid Other | Admitting: Ophthalmology

## 2022-04-15 ENCOUNTER — Encounter (INDEPENDENT_AMBULATORY_CARE_PROVIDER_SITE_OTHER): Payer: Self-pay | Admitting: Ophthalmology

## 2022-04-15 ENCOUNTER — Ambulatory Visit (INDEPENDENT_AMBULATORY_CARE_PROVIDER_SITE_OTHER): Payer: Medicaid Other | Admitting: Ophthalmology

## 2022-04-15 DIAGNOSIS — I10 Essential (primary) hypertension: Secondary | ICD-10-CM | POA: Diagnosis not present

## 2022-04-15 DIAGNOSIS — E113313 Type 2 diabetes mellitus with moderate nonproliferative diabetic retinopathy with macular edema, bilateral: Secondary | ICD-10-CM

## 2022-04-15 DIAGNOSIS — H04123 Dry eye syndrome of bilateral lacrimal glands: Secondary | ICD-10-CM

## 2022-04-15 DIAGNOSIS — H2513 Age-related nuclear cataract, bilateral: Secondary | ICD-10-CM

## 2022-04-15 DIAGNOSIS — H35033 Hypertensive retinopathy, bilateral: Secondary | ICD-10-CM | POA: Diagnosis not present

## 2022-04-15 MED ORDER — BEVACIZUMAB CHEMO INJECTION 1.25MG/0.05ML SYRINGE FOR KALEIDOSCOPE
1.2500 mg | INTRAVITREAL | Status: AC | PRN
  Administered 2022-04-15: 1.25 mg via INTRAVITREAL

## 2022-05-20 NOTE — Progress Notes (Signed)
[facility] Note  05/24/2022     CHIEF COMPLAINT Patient presents for Retina Follow Up  HISTORY OF PRESENT ILLNESS: Ana Long is a 47 y.o. female who presents to the clinic today for:   HPI     Retina Follow Up   Patient presents with  Diabetic Retinopathy.  In both eyes.  This started years ago.  Duration of 6 weeks.  Since onset it is gradually worsening.  I, the attending physician,  performed the HPI with the patient and updated documentation appropriately.        Comments   Patient feels that she has trouble clear items. Seeing things far away is troublesome things are closer than she thinks they are. She is not using any eye drops at this time. Her blood sugar was 102.      Last edited by Bernarda Caffey, MD on 05/24/2022  2:25 PM.    Pt states she is completely off steroids and her breathing has been okay, pt is trying to follow a plant based diet, pt states she is having a hard time realizing how close things are to her   Referring physician: [name] MD [place], [place] [address]  HISTORICAL INFORMATION:   Selected notes from the [facility] Re-referred by Dr. [name] for DM exam   CURRENT MEDICATIONS: Current Outpatient Medications (Ophthalmic Drugs)  Medication Sig   Olopatadine HCl 0.2 % SOLN Place one drop into both eyes daily.   No current facility-administered medications for this visit. (Ophthalmic Drugs)   Current Outpatient Medications (Other)  Medication Sig   acyclovir (ZOVIRAX) 400 MG tablet TAKE 1 TABLET BY MOUTH TWICE A DAY   albuterol (PROAIR HFA) 108 (90 Base) MCG/ACT inhaler Inhale 2 puffs into the lungs every 4 (four) hours as needed for wheezing or shortness of breath (cough). (Patient taking differently: Inhale 2 puffs into the lungs every 4 (four) hours as needed for wheezing or shortness of breath (or coughing).)   atorvastatin (LIPITOR) 10 MG tablet Take 10 mg by mouth  daily.   benzonatate (TESSALON) 200 MG capsule Take 1 capsule (200 mg total) by mouth 3 (three) times daily as needed for cough.   Blood Glucose Monitoring Suppl (ACCU-CHEK AVIVA PLUS) w/Device KIT Please check you blood sugars 4 times a day   CIPRODEX OTIC suspension Place into the right ear.   DERMA-SMOOTHE/FS SCALP 0.01 % OIL Apply topically.   fluconazole (DIFLUCAN) 150 MG tablet TAKE 1 TABLET BY MOUTH NOW. THEN REPEAT IN 1 WEEK   furosemide (LASIX) 20 MG tablet Take 20 mg by mouth daily as needed for fluid or edema.   glucose blood (ACCU-CHEK SMARTVIEW) test strip TEST FOUR TIMES DAILY   HUMULIN R U-500 KWIKPEN 500 UNIT/ML kwikpen Inject into the skin.   hydrochlorothiazide (HYDRODIURIL) 12.5 MG tablet Take by mouth.   Insulin Pen Needle (B-D UF III MINI PEN NEEDLES) 31G X 5 MM MISC USE AS DIRECTED TO INJECT INSULIN FIVE A DAY E11.65   lactulose (CEPHULAC) 10 g packet Take 1 packet (10 g total) by mouth daily as needed. Reported on 07/29/2015 (Patient taking differently: Take 10 g by mouth daily as needed (for constipation).)   Lancets (ACCU-CHEK SOFT TOUCH) lancets Use as instructed   loratadine (CLARITIN) 10 MG tablet Take 1 tablet (10 mg total) by mouth 2 (two) times daily.   methocarbamol (ROBAXIN) 500 MG tablet Take 1 tablet (500 mg total) by  mouth every 8 (eight) hours as needed.   mometasone-formoterol (DULERA) 200-5 MCG/ACT AERO Inhale 2 puffs into the lungs 2 (two) times daily.   montelukast (SINGULAIR) 10 MG tablet Take 1 tablet (10 mg total) by mouth at bedtime.   nitroGLYCERIN (NITRODUR - DOSED IN MG/24 HR) 0.2 mg/hr patch Apply 1/4th patch to affected achilles, change daily   omeprazole (PRILOSEC) 20 MG capsule Take 1 capsule (20 mg total) by mouth daily before breakfast.   SPIRIVA RESPIMAT 1.25 MCG/ACT AERS SMARTSIG:2 Puff(s) Via Inhaler Daily   sulfamethoxazole-trimethoprim (BACTRIM DS) 800-160 MG tablet Take 1 tablet by mouth 2 (two) times daily.   topiramate (TOPAMAX) 25 MG  tablet Take by mouth.   Current Facility-Administered Medications (Other)  Medication Route   diphenhydrAMINE (BENADRYL) injection 12.5 mg Intramuscular   REVIEW OF SYSTEMS: ROS   Positive for: Endocrine, Eyes, Respiratory Negative for: Constitutional, Gastrointestinal, Neurological, Skin, Genitourinary, Musculoskeletal, HENT, Cardiovascular, Psychiatric, Allergic/Imm, Heme/Lymph Last edited by Annie Paras, COT on 05/24/2022  1:20 PM.     ALLERGIES Allergies  Allergen Reactions   Bee Venom Anaphylaxis and Swelling   Wasp Venom Anaphylaxis and Swelling   Citrus Hives and Itching   Latex Hives and Swelling   Penicillins Hives and Itching    Has patient had a PCN reaction causing immediate rash, facial/tongue/throat swelling, SOB or lightheadedness with hypotension: Yes Has patient had a PCN reaction causing severe rash involving mucus membranes or skin necrosis: Unk Has patient had a PCN reaction that required hospitalization: Was already in hosp Has patient had a PCN reaction occurring within the last 10 years: No If all of the above answers are "NO", then may proceed with Cephalosporin use.    Shellfish Allergy Other (See Comments) and Swelling    Patient is uncertain; stated that she tolerates this (??)   Soap Hives, Itching and Swelling    PUREX LAUNDRY DETERGENT   Tomato Hives and Itching   Banana Itching and Nausea Only   Chocolate Itching   Fluticasone Other (See Comments)    Per patient, makes sneeze and gag   Hydrocodone Itching and Nausea Only   Other Itching    Acidic foods- Itching [place]- Itching in the roof of the mouth   Adhesive [Tape] Itching and Rash    Allergic to  "leads"   Doxycycline Other (See Comments)    AFFECTS JOINTS   Liraglutide Nausea Only   Metformin And Related Nausea Only, Rash, Other (See Comments) and Diarrhea    GI UPSET, also   Red Dye Itching, Rash and Other (See Comments)    At eye doctor's office, the dilating drops- Skin  breaks out, chest turned red, itching, face became swollen   PAST MEDICAL HISTORY 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   Cataract    NS OU   Complication of anesthesia    states is hard to wake up post-op   Constipation 12/30/2014   Diabetes mellitus    intolerant to Metformin   Diabetic retinopathy    NPDR OU   Dry skin    hands    Family history of anesthesia complication    mother went into coma during c-section   Genital herpes 01/01/2006   on  acyclovir BID    GERD (gastroesophageal reflux disease)    Herpes genital    Hyperglycemia 12/26/2017   Hypertensive retinopathy    OU   Low back pain 11/22/2012   Lower extremity edema 10/18/2012   Menorrhagia 11/06/2015   Morbid obesity with BMI of 50 to 60 07/23/2009   Obesity hypoventilation syndrome 10/30/2012   Obesity, morbid 01/10/2014   OSA on CPAP     AHI was on  11-17-12 to 120/hr, titrated to 15 cm with 3 cm EPR/    PCOS (polycystic ovarian syndrome)    Rheumatoid arthritis    Right ankle pain 06/19/2015   Right hip pain 09/04/2017   Right shoulder pain 06/01/2017   Seasonal allergies    Sleep apnea, obstructive    uses CPAP nightly - had sleep study 08/22/2007   Past Surgical History:  Procedure Laterality Date   CARPAL TUNNEL RELEASE Right 10/02/2012   Procedure: RIGHT CARPAL TUNNEL RELEASE ENDOSCOPIC;  Surgeon: Jolyn Nap, MD;  Location: [facility];  Service: Orthopedics;  Laterality: Right;   CHOLECYSTECTOMY N/A 12/02/2014   Procedure: LAPAROSCOPIC CHOLECYSTECTOMY;  Surgeon: Armandina Gemma, MD;  Location: [facility];  Service: General;  Laterality: N/A;   HYSTEROSCOPY WITH D & C  01-08-2002   FAMILY HISTORY Family History  Problem Relation Age of Onset    Other Mother    Diabetes Maternal Aunt    Heart attack Maternal Aunt    Alzheimer's disease Maternal Uncle    Cancer Maternal Uncle        prostate   Diabetes Paternal Aunt    Cancer Paternal Aunt        brain,mouth   Alzheimer's disease Paternal Aunt    Colon polyps Paternal Uncle    Diabetes Paternal Uncle    Diabetes Maternal Grandmother    Diabetes Maternal Grandfather    Leukemia Cousin    Other Other    Obesity Other    Other Other    Colitis Neg Hx    SOCIAL HISTORY Social History   Tobacco Use   Smoking status: Former    Years: 10    Types: Cigarettes    Quit date: 05/24/2010    Years since quitting: 12.0   Smokeless tobacco: Never  Vaping Use   Vaping Use: Never used  Substance Use Topics   Alcohol use: Yes    Alcohol/week: 0.0 standard drinks of alcohol    Comment: socially   Drug use: No       OPHTHALMIC EXAM: Base Eye Exam     Visual Acuity (Snellen - Linear)       Right Left   Dist cc 20/30 20/30   Dist ph cc 20/25 20/25    Correction: Glasses         Tonometry (Tonopen, 1:27 PM)       Right Left   Pressure 15 16         Pupils       Dark Light Shape React APD   Right 3 2 Round Minimal None   Left 3 2 Round Minimal None         Visual Fields       Left Right    Full Full         Extraocular Movement       Right Left    Full, Ortho Full, Ortho         Neuro/Psych     Oriented x3: Yes   Mood/Affect: Normal  Dilation     Both eyes: 2.5% Phenylephrine @ 1:23 PM           Slit Lamp and Fundus Exam     Slit Lamp Exam       Right Left   Lids/Lashes Dermatochalasis - upper lid, mild Meibomian gland dysfunction Dermatochalasis - upper lid, mild Meibomian gland dysfunction   Conjunctiva/Sclera White and quiet White and quiet   Cornea 2+Punctate epithelial erosions, mild tear film debris 3+fine Punctate epithelial erosions   Anterior Chamber deep and quiet deep and quiet   Iris round and dilated,  No NVI round and dilated   Lens 1+ NS, 1+CC 1+NS, 1+CC   Anterior Vitreous mild vitreous syneresis mild vitreous syneresis, scattered vitreous condensations         Fundus Exam       Right Left   Disc Pink and sharp, +cupping Pink and sharp, +cupping   C/D Ratio 0.7 0.6   Macula good foveal reflex, focal exudates ST macula, scattered MA, focal cystic changes ST macula - slightly increased, focal laser changes inferior macula good foveal reflex, scatttered MA's and exudate, central cystic changes -- slightly increased, focal cluster of exudates SN macula -- persistent   Vessels attenuated, Tortuous attenuated, Tortuous   Periphery Attached, scattered MA/exudates greatest nasally Attached, scattered MA and exudate greatest superior to disc, white without pressure temporally           Refraction     Wearing Rx       Sphere Cylinder Axis   Right -2.50 +1.50 013   Left -2.25 Sphere     Type: SVL           IMAGING AND PROCEDURES  Imaging and Procedures for @TODAY @  OCT, Retina - OU - Both Eyes       Right Eye Quality was good. Central Foveal Thickness: 223. Progression has worsened. Findings include normal foveal contour, no SRF, intraretinal hyper-reflective material, intraretinal fluid, vitreomacular adhesion (Mild interval increase IRF/IRHM temporal macula ).   Left Eye Quality was good. Central Foveal Thickness: 311. Progression has worsened. Findings include no SRF, abnormal foveal contour, intraretinal hyper-reflective material, intraretinal fluid (Mild interval increase in IRF SN fovea and mac).   Notes *Images captured and stored on drive  Diagnosis / Impression:  +DME OU OD: Mild interval increase IRF/IRHM temporal macula  OS: Mild interval increase in IRF SN fovea and mac  Clinical management:  See below  Abbreviations: NFP - Normal foveal profile. CME - cystoid macular edema. PED - pigment epithelial detachment. IRF - intraretinal fluid. SRF -  subretinal fluid. EZ - ellipsoid zone. ERM - epiretinal membrane. ORA - outer retinal atrophy. ORT - outer retinal tubulation. SRHM - subretinal hyper-reflective material      Intravitreal Injection, Pharmacologic Agent - OS - Left Eye       Time Out 05/24/2022. 2:00 PM. Confirmed correct patient, procedure, site, and patient consented.   Anesthesia Topical anesthesia was used. Anesthetic medications included Lidocaine 2%, Proparacaine 0.5%.   Procedure Preparation included 5% betadine to ocular surface, eyelid speculum. A (32g) needle was used.   Injection: 1.25 mg Bevacizumab 1.25mg /0.61ml   Route: Intravitreal, Site: Left Eye   NDC: [id number], LotGK:[id number], Expiration date: 08/26/2022   Post-op Post injection exam found visual acuity of at least counting fingers. The patient tolerated the procedure well. There were no complications. The patient received written and verbal post procedure care education. Post injection medications were not given.  ASSESSMENT/PLAN:    ICD-10-CM   1. Moderate nonproliferative diabetic retinopathy of both eyes with macular edema associated with type 2 diabetes mellitus  E11.3313 OCT, Retina - OU - Both Eyes    Intravitreal Injection, Pharmacologic Agent - OS - Left Eye    Bevacizumab (AVASTIN) SOLN 1.25 mg    2. Essential hypertension  I10     3. Hypertensive retinopathy of both eyes  H35.033     4. Nuclear sclerosis of both eyes  H25.13     5. Dry eyes  H04.123      1.  Moderate non-proliferative diabetic retinopathy w/ DME, OU  - pt lost to f/u from 10.23.2019 to 11.18.20  - FA in 2019 showed leaking MA OU; no NV OU **of note, pt had pruritic rxn to fluorescein dye -- resolved with 12.5 mg IV benadryl**  - FA 12.16.2020 shows late leaking MA's, non-perfusion defects; no NV OU - s/p IVA OD #1 (12.16.20), #2 (01.25.21), #3 (05.26.21), #4 (07.02.21), #5 (10.15.21), #6 (11.23.21), #7 (07.27.22), #8 (09.09.22), #9  (11.11.22), #10 (01.20.23), #11 (02.23.23), #12 (04.09.23), #13 (05.23.23), #14 (06.30.23), #15 (08.04.23), #16 (09.01.23), #17 (10.06.23), #18 (11.10.23) #19 (12.15.23), #20 (01.19.24), #21 (02.22.24)  - s/p focal laser OD (03.19.21), (04.01.22) - s/p IVA OS #1 (12.23.20), #2 (01.27.21), #3 (03.02.21), #4 (05.26.21), #5 (07.02.21), #6 (10.15.21), #7 (11.23.21), #8 (01.11.22), #9 (03.04.22), #10 (04.27.22), #11 (11.14.22), #12 (01.10.23), #13 (02.20.23), #14 (04.05.23), #15 (05.19.23), #16 (06.23.23), #17 (07.28.23), #18 (09.01.23), #19 (10.06.23), #20 (11.10.23) #21 (12.15.23), #22 (01.19.24), #23 (02.22.24)  - exam shows scattered MA, IRH, exudates and mild macular edema OU  - BCVA 20/25 OU -- stable - OCT shows OD: Mild interval increase IRF/IRHM temporal macula; OS: Mild interval increase in IRF SN fovea and mac - Recommend IVA OS #24 today, 04.01.24 -- will inject OD on Friday per pt request  - pt wishes to proceed with injection - RBA of procedure discussed, questions answered - see procedure note  - Avastin informed consent form re-signed and scanned on 05.19.2023  - f/u Friday at 1:00 -- DFE, OCT, possible injection OD  - f/u 4 weeks (~04.29.28) for possible injection OS  2,3. Hypertensive retinopathy OU  - discussed importance of tight BP control             - continue to monitor   4. Nuclear Sclerosis  - The symptoms of cataract, surgical options, and treatments and risks were discussed with patient.   - discussed diagnosis and progression  - under the expert management of [place]   5. Dry eyes OU  - likely cause of decreased vision OU today - recommend artificial tears and lubricating ointment as needed  Ophthalmic Meds Ordered this visit:  Meds ordered this encounter  Medications   Bevacizumab (AVASTIN) SOLN 1.25 mg    This document serves as a record of services personally performed by Gardiner Sleeper, MD, PhD. It was created on their behalf by Orvan Falconer, an  ophthalmic technician. The creation of this record is the provider's dictation and/or activities during the visit.    Electronically signed by: Orvan Falconer, OA, 05/24/22  2:25 PM  This document serves as a record of services personally performed by Gardiner Sleeper, MD, PhD. It was created on their behalf by San Jetty. Owens Shark, OA an ophthalmic technician. The creation of this record is the provider's dictation and/or activities during the visit.    Electronically signed by: San Jetty. Owens Shark, OA 04.01.2024 2:25 PM  Gardiner Sleeper, M.D., Ph.D. Diseases & Surgery of the Retina and Vitreous [facility]  I have reviewed the above documentation for accuracy and completeness, and I agree with the above. Gardiner Sleeper, M.D., Ph.D. 05/24/22 2:26 PM  Abbreviations: M myopia (nearsighted); A astigmatism; H hyperopia (farsighted); P presbyopia; Mrx spectacle prescription;  CTL contact lenses; OD right eye; OS left eye; OU both eyes  XT exotropia; ET esotropia; PEK punctate epithelial keratitis; PEE punctate epithelial erosions; DES dry eye syndrome; MGD meibomian gland dysfunction; ATs artificial tears; PFAT's preservative free artificial tears; [place] nuclear sclerotic cataract; PSC posterior subcapsular cataract; ERM epi-retinal membrane; PVD posterior vitreous detachment; RD retinal detachment; DM diabetes mellitus; DR diabetic retinopathy; NPDR non-proliferative diabetic retinopathy; PDR proliferative diabetic retinopathy; CSME clinically significant macular edema; DME diabetic macular edema; dbh dot blot hemorrhages; CWS cotton wool spot; POAG primary open angle glaucoma; C/D cup-to-disc ratio; HVF humphrey visual field; GVF goldmann visual field; OCT optical coherence tomography; IOP intraocular pressure; BRVO Branch retinal vein occlusion; CRVO central retinal vein occlusion; CRAO central retinal artery occlusion; BRAO branch retinal artery occlusion; RT retinal tear; SB scleral  buckle; PPV pars plana vitrectomy; VH Vitreous hemorrhage; PRP panretinal laser photocoagulation; IVK intravitreal kenalog; VMT vitreomacular traction; MH Macular hole;  NVD neovascularization of the disc; NVE neovascularization elsewhere; AREDS age related eye disease study; ARMD age related macular degeneration; POAG primary open angle glaucoma; EBMD epithelial/anterior basement membrane dystrophy; ACIOL anterior chamber intraocular lens; IOL intraocular lens; PCIOL posterior chamber intraocular lens; Phaco/IOL phacoemulsification with intraocular lens placement; [place] photorefractive keratectomy; LASIK laser assisted in situ keratomileusis; HTN hypertension; DM diabetes mellitus; COPD chronic obstructive pulmonary disease

## 2022-05-21 ENCOUNTER — Encounter (INDEPENDENT_AMBULATORY_CARE_PROVIDER_SITE_OTHER): Payer: Medicaid Other | Admitting: Ophthalmology

## 2022-05-24 ENCOUNTER — Ambulatory Visit (INDEPENDENT_AMBULATORY_CARE_PROVIDER_SITE_OTHER): Payer: Medicaid Other | Admitting: Ophthalmology

## 2022-05-24 ENCOUNTER — Encounter (INDEPENDENT_AMBULATORY_CARE_PROVIDER_SITE_OTHER): Payer: Self-pay | Admitting: Ophthalmology

## 2022-05-24 DIAGNOSIS — H2513 Age-related nuclear cataract, bilateral: Secondary | ICD-10-CM | POA: Diagnosis not present

## 2022-05-24 DIAGNOSIS — H35033 Hypertensive retinopathy, bilateral: Secondary | ICD-10-CM | POA: Diagnosis not present

## 2022-05-24 DIAGNOSIS — H04123 Dry eye syndrome of bilateral lacrimal glands: Secondary | ICD-10-CM

## 2022-05-24 DIAGNOSIS — I10 Essential (primary) hypertension: Secondary | ICD-10-CM | POA: Diagnosis not present

## 2022-05-24 DIAGNOSIS — E113313 Type 2 diabetes mellitus with moderate nonproliferative diabetic retinopathy with macular edema, bilateral: Secondary | ICD-10-CM | POA: Diagnosis not present

## 2022-05-24 MED ORDER — BEVACIZUMAB CHEMO INJECTION 1.25MG/0.05ML SYRINGE FOR KALEIDOSCOPE
1.2500 mg | INTRAVITREAL | Status: AC | PRN
  Administered 2022-05-24: 1.25 mg via INTRAVITREAL

## 2022-05-28 ENCOUNTER — Encounter (INDEPENDENT_AMBULATORY_CARE_PROVIDER_SITE_OTHER): Payer: Medicaid Other | Admitting: Ophthalmology

## 2022-05-28 DIAGNOSIS — I10 Essential (primary) hypertension: Secondary | ICD-10-CM

## 2022-05-28 DIAGNOSIS — E113313 Type 2 diabetes mellitus with moderate nonproliferative diabetic retinopathy with macular edema, bilateral: Secondary | ICD-10-CM

## 2022-05-28 DIAGNOSIS — H04123 Dry eye syndrome of bilateral lacrimal glands: Secondary | ICD-10-CM

## 2022-05-28 DIAGNOSIS — H2513 Age-related nuclear cataract, bilateral: Secondary | ICD-10-CM

## 2022-05-28 DIAGNOSIS — H35033 Hypertensive retinopathy, bilateral: Secondary | ICD-10-CM

## 2022-05-28 NOTE — Progress Notes (Signed)
[facility] - Clinic Note  06/01/2022     CHIEF COMPLAINT Patient presents for Retina Follow Up  HISTORY OF PRESENT ILLNESS: Ana Long is a 47 y.o. female who presents to the clinic today for:   HPI     Retina Follow Up   Patient presents with  Diabetic Retinopathy.  In both eyes.  This started 4 days ago.  Duration of 4 days.  Since onset it is stable.  I, the attending physician,  performed the HPI with the patient and updated documentation appropriately.        Comments   4 day retina follow up NPDR OU and IVA OD pt is reporting no vision changes noticed she has a floaters in the right eye that is without changes she denies any flashes of light       Last edited by Rennis Chris, MD on 06/01/2022  4:58 PM.    Pt is here for IVA OD   Referring physician: Olivia Canter MD [address] [place], [place] [address]  HISTORICAL INFORMATION:   Selected notes from the MEDICAL RECORD NUMBER Re-referred by Dr. Fabian Sharp for DM exam   CURRENT MEDICATIONS: Current Outpatient Medications (Ophthalmic Drugs)  Medication Sig   Olopatadine HCl 0.2 % SOLN Place one drop into both eyes daily.   No current facility-administered medications for this visit. (Ophthalmic Drugs)   Current Outpatient Medications (Other)  Medication Sig   acyclovir (ZOVIRAX) 400 MG tablet TAKE 1 TABLET BY MOUTH TWICE A DAY   albuterol (PROAIR HFA) 108 (90 Base) MCG/ACT inhaler Inhale 2 puffs into the lungs every 4 (four) hours as needed for wheezing or shortness of breath (cough). (Patient taking differently: Inhale 2 puffs into the lungs every 4 (four) hours as needed for wheezing or shortness of breath (or coughing).)   atorvastatin (LIPITOR) 10 MG tablet Take 10 mg by mouth daily.   benzonatate (TESSALON) 200 MG capsule Take 1 capsule (200 mg total) by mouth 3 (three) times daily as needed for cough.   Blood Glucose Monitoring Suppl (ACCU-CHEK AVIVA PLUS) w/Device KIT  Please check you blood sugars 4 times a day   CIPRODEX OTIC suspension Place into the right ear.   DERMA-SMOOTHE/FS SCALP 0.01 % OIL Apply topically.   fluconazole (DIFLUCAN) 150 MG tablet TAKE 1 TABLET BY MOUTH NOW. THEN REPEAT IN 1 WEEK   furosemide (LASIX) 20 MG tablet Take 20 mg by mouth daily as needed for fluid or edema.   glucose blood (ACCU-CHEK SMARTVIEW) test strip TEST FOUR TIMES DAILY   HUMULIN R U-500 KWIKPEN 500 UNIT/ML kwikpen Inject into the skin.   hydrochlorothiazide (HYDRODIURIL) 12.5 MG tablet Take by mouth.   Insulin Pen Needle (B-D UF III MINI PEN NEEDLES) 31G X 5 MM MISC USE AS DIRECTED TO INJECT INSULIN FIVE A DAY E11.65   lactulose (CEPHULAC) 10 g packet Take 1 packet (10 g total) by mouth daily as needed. Reported on 07/29/2015 (Patient taking differently: Take 10 g by mouth daily as needed (for constipation).)   Lancets (ACCU-CHEK SOFT TOUCH) lancets Use as instructed   loratadine (CLARITIN) 10 MG tablet Take 1 tablet (10 mg total) by mouth 2 (two) times daily.   methocarbamol (ROBAXIN) 500 MG tablet Take 1 tablet (500 mg total) by mouth every 8 (eight) hours as needed.   mometasone-formoterol (DULERA) 200-5 MCG/ACT AERO Inhale 2 puffs into the lungs 2 (two) times daily.   montelukast (SINGULAIR) 10 MG tablet Take  1 tablet (10 mg total) by mouth at bedtime.   nitroGLYCERIN (NITRODUR - DOSED IN MG/24 HR) 0.2 mg/hr patch Apply 1/4th patch to affected achilles, change daily   omeprazole (PRILOSEC) 20 MG capsule Take 1 capsule (20 mg total) by mouth daily before breakfast.   SPIRIVA RESPIMAT 1.25 MCG/ACT AERS SMARTSIG:2 Puff(s) Via Inhaler Daily   sulfamethoxazole-trimethoprim (BACTRIM DS) 800-160 MG tablet Take 1 tablet by mouth 2 (two) times daily.   topiramate (TOPAMAX) 25 MG tablet Take by mouth.   Current Facility-Administered Medications (Other)  Medication Route   diphenhydrAMINE (BENADRYL) injection 12.5 mg Intramuscular   REVIEW OF SYSTEMS: ROS   Positive for:  Endocrine, Eyes, Respiratory Negative for: Constitutional, Gastrointestinal, Neurological, Skin, Genitourinary, Musculoskeletal, HENT, Cardiovascular, Psychiatric, Allergic/Imm, Heme/Lymph Last edited by Etheleen Mayhew, COT on 06/01/2022  1:32 PM.      ALLERGIES Allergies  Allergen Reactions   Bee Venom Anaphylaxis and Swelling   Wasp Venom Anaphylaxis and Swelling   Citrus Hives and Itching   Latex Hives and Swelling   Penicillins Hives and Itching    Has patient had a PCN reaction causing immediate rash, facial/tongue/throat swelling, SOB or lightheadedness with hypotension: Yes Has patient had a PCN reaction causing severe rash involving mucus membranes or skin necrosis: Unk Has patient had a PCN reaction that required hospitalization: Was already in hosp Has patient had a PCN reaction occurring within the last 10 years: No If all of the above answers are "NO", then may proceed with Cephalosporin use.    Shellfish Allergy Other (See Comments) and Swelling    Patient is uncertain; stated that she tolerates this (??)   Soap Hives, Itching and Swelling    PUREX LAUNDRY DETERGENT   Tomato Hives and Itching   Banana Itching and Nausea Only   Chocolate Itching   Fluticasone Other (See Comments)    Per patient, makes sneeze and gag   Hydrocodone Itching and Nausea Only   Other Itching    Acidic foods- Itching [place]- Itching in the roof of the mouth   Adhesive [Tape] Itching and Rash    Allergic to  "leads"   Doxycycline Other (See Comments)    AFFECTS JOINTS   Liraglutide Nausea Only   Metformin And Related Nausea Only, Rash, Other (See Comments) and Diarrhea    GI UPSET, also   Red Dye Itching, Rash and Other (See Comments)    At eye doctor's office, the dilating drops- Skin breaks out, chest turned red, itching, face became swollen   PAST MEDICAL HISTORY Past Medical History:  Diagnosis Date   Acute nonintractable headache 12/26/2017   Arthritis    major joints    Asthma    prn inhaler   Asthma exacerbation 07/25/2013   Asthma in adult, severe persistent, with acute exacerbation 01/11/2018   Carpal tunnel syndrome of right wrist 09/2012   Cataract    NS OU   Complication of anesthesia    states is hard to wake up post-op   Constipation 12/30/2014   Diabetes mellitus    intolerant to Metformin   Diabetic retinopathy    NPDR OU   Dry skin    hands    Family history of anesthesia complication    mother went into coma during c-section   Genital herpes 01/01/2006   on acyclovir BID    GERD (gastroesophageal reflux disease)    Herpes genital    Hyperglycemia 12/26/2017   Hypertensive retinopathy    OU   Low back  pain 11/22/2012   Lower extremity edema 10/18/2012   Menorrhagia 11/06/2015   Morbid obesity with BMI of 50 to 60 07/23/2009   Obesity hypoventilation syndrome 10/30/2012   Obesity, morbid 01/10/2014   OSA on CPAP     AHI was on  11-17-12 to 120/hr, titrated to 15 cm with 3 cm EPR/    PCOS (polycystic ovarian syndrome)    Rheumatoid arthritis    Right ankle pain 06/19/2015   Right hip pain 09/04/2017   Right shoulder pain 06/01/2017   Seasonal allergies    Sleep apnea, obstructive    uses CPAP nightly - had sleep study 08/22/2007   Past Surgical History:  Procedure Laterality Date   CARPAL TUNNEL RELEASE Right 10/02/2012   Procedure: RIGHT CARPAL TUNNEL RELEASE ENDOSCOPIC;  Surgeon: Jodi Marbleavid A Thompson, MD;  Location: [facility];  Service: Orthopedics;  Laterality: Right;   CHOLECYSTECTOMY N/A 12/02/2014   Procedure: LAPAROSCOPIC CHOLECYSTECTOMY;  Surgeon: Darnell Levelodd Gerkin, MD;  Location: [facility];  Service: General;  Laterality: N/A;   HYSTEROSCOPY WITH D & C  01-08-2002   FAMILY HISTORY Family History  Problem Relation Age of Onset   Other Mother    Diabetes Maternal Aunt    Heart attack Maternal Aunt    Alzheimer's disease Maternal Uncle    Cancer Maternal Uncle        prostate   Diabetes Paternal Aunt    Cancer Paternal  Aunt        brain,mouth   Alzheimer's disease Paternal Aunt    Colon polyps Paternal Uncle    Diabetes Paternal Uncle    Diabetes Maternal Grandmother    Diabetes Maternal Grandfather    Leukemia Cousin    Other Other    Obesity Other    Other Other    Colitis Neg Hx    SOCIAL HISTORY Social History   Tobacco Use   Smoking status: Former    Years: 10    Types: Cigarettes    Quit date: 05/24/2010    Years since quitting: 12.0   Smokeless tobacco: Never  Vaping Use   Vaping Use: Never used  Substance Use Topics   Alcohol use: Yes    Alcohol/week: 0.0 standard drinks of alcohol    Comment: socially   Drug use: No       OPHTHALMIC EXAM: Base Eye Exam     Visual Acuity (Snellen - Linear)       Right Left   Dist cc 20/30 -2 20/40 -2   Dist ph cc NI 20/30 -2         Tonometry (Tonopen, 1:37 PM)       Right Left   Pressure 14 16         Pupils       Pupils Dark Light Shape React APD   Right PERRL 3 2 Round Brisk None   Left PERRL 3 2 Round Brisk None         Visual Fields       Left Right    Full Full         Extraocular Movement       Right Left    Full, Ortho Full, Ortho         Neuro/Psych     Oriented x3: Yes   Mood/Affect: Normal         Dilation     Right eye: 2.5% Phenylephrine @ 1:37 PM  Slit Lamp and Fundus Exam     Slit Lamp Exam       Right Left   Lids/Lashes Dermatochalasis - upper lid, mild Meibomian gland dysfunction Dermatochalasis - upper lid, mild Meibomian gland dysfunction   Conjunctiva/Sclera White and quiet White and quiet   Cornea 2+Punctate epithelial erosions, mild tear film debris 3+fine Punctate epithelial erosions   Anterior Chamber deep and quiet deep and quiet   Iris round and dilated, No NVI round and dilated   Lens 1+ NS, 1+CC 1+NS, 1+CC   Anterior Vitreous mild vitreous syneresis mild vitreous syneresis, scattered vitreous condensations         Fundus Exam       Right Left    Disc Pink and sharp, +cupping Pink and sharp, +cupping   C/D Ratio 0.7 0.6   Macula good foveal reflex, focal exudates ST macula, scattered MA, focal cystic changes ST macula - slightly increased, focal laser changes inferior macula good foveal reflex, scatttered MA's and exudate, central cystic changes -- slightly increased, focal cluster of exudates SN macula -- persistent   Vessels attenuated, Tortuous attenuated, Tortuous   Periphery Attached, scattered MA/exudates greatest nasally Attached, scattered MA and exudate greatest superior to disc, white without pressure temporally           Refraction     Wearing Rx       Sphere Cylinder Axis   Right -2.50 +1.50 013   Left -2.25 Sphere     Type: SVL           IMAGING AND PROCEDURES  Imaging and Procedures for @TODAY @  OCT, Retina - OU - Both Eyes       Right Eye Quality was good. Central Foveal Thickness: 230. Progression has improved. Findings include normal foveal contour, no SRF, intraretinal hyper-reflective material, intraretinal fluid, vitreomacular adhesion (Mild interval improvement in IRF/IRHM temporal macula ).   Left Eye Quality was good. Central Foveal Thickness: 313. Progression has improved. Findings include no SRF, abnormal foveal contour, intraretinal hyper-reflective material, intraretinal fluid (Persistent IRF SN fovea and mac -- slightly improved).   Notes *Images captured and stored on drive  Diagnosis / Impression:  +DME OU OD: Mild interval improvement in IRF/IRHM temporal macula  OS: Persistent IRF SN fovea and mac -- slightly improved  Clinical management:  See below  Abbreviations: NFP - Normal foveal profile. CME - cystoid macular edema. PED - pigment epithelial detachment. IRF - intraretinal fluid. SRF - subretinal fluid. EZ - ellipsoid zone. ERM - epiretinal membrane. ORA - outer retinal atrophy. ORT - outer retinal tubulation. SRHM - subretinal hyper-reflective material       Intravitreal Injection, Pharmacologic Agent - OD - Right Eye       Time Out 06/01/2022. 1:50 PM. Confirmed correct patient, procedure, site, and patient consented.   Anesthesia Topical anesthesia was used. Anesthetic medications included Lidocaine 2%, Proparacaine 0.5%.   Procedure Preparation included 5% betadine to ocular surface, eyelid speculum. A supplied (32g) needle was used.   Injection: 1.25 mg Bevacizumab 1.25mg /0.85ml   Route: Intravitreal, Site: Right Eye   NDC: [id number], Lot: [id number], Expiration date: 07/10/2022   Post-op Post injection exam found visual acuity of at least counting fingers. The patient tolerated the procedure well. There were no complications. The patient received written and verbal post procedure care education. Post injection medications were not given.             ASSESSMENT/PLAN:    ICD-10-CM   1. Moderate nonproliferative diabetic  retinopathy of both eyes with macular edema associated with type 2 diabetes mellitus  E11.3313 OCT, Retina - OU - Both Eyes    Intravitreal Injection, Pharmacologic Agent - OD - Right Eye    Bevacizumab (AVASTIN) SOLN 1.25 mg    2. Essential hypertension  I10     3. Hypertensive retinopathy of both eyes  H35.033     4. Nuclear sclerosis of both eyes  H25.13     5. Dry eyes  H04.123       1.  Moderate non-proliferative diabetic retinopathy w/ DME, OU  - pt lost to f/u from 10.23.2019 to 11.18.20  - FA in 2019 showed leaking MA OU; no NV OU **of note, pt had pruritic rxn to fluorescein dye -- resolved with 12.5 mg IV benadryl**  - FA 12.16.2020 shows late leaking MA's, non-perfusion defects; no NV OU - s/p IVA OD #1 (12.16.20), #2 (01.25.21), #3 (05.26.21), #4 (07.02.21), #5 (10.15.21), #6 (11.23.21), #7 (07.27.22), #8 (09.09.22), #9 (11.11.22), #10 (01.20.23), #11 (02.23.23), #12 (04.09.23), #13 (05.23.23), #14 (06.30.23), #15 (08.04.23), #16 (09.01.23), #17 (10.06.23), #18 (11.10.23) #19 (12.15.23), #20  (01.19.24), #21 (02.22.24)  - s/p focal laser OD (03.19.21), (04.01.22) - s/p IVA OS #1 (12.23.20), #2 (01.27.21), #3 (03.02.21), #4 (05.26.21), #5 (07.02.21), #6 (10.15.21), #7 (11.23.21), #8 (01.11.22), #9 (03.04.22), #10 (04.27.22), #11 (11.14.22), #12 (01.10.23), #13 (02.20.23), #14 (04.05.23), #15 (05.19.23), #16 (06.23.23), #17 (07.28.23), #18 (09.01.23), #19 (10.06.23), #20 (11.10.23) #21 (12.15.23), #22 (01.19.24), #23 (02.22.24), #24 (04.01.24)  - exam shows scattered MA, IRH, exudates and mild macular edema OU  - BCVA 20/30 OD, 20/40 OS -- both decreased from 20/25 - OCT shows OD: Mild interval improvement in IRF/IRHM temporal macula ; OS: Persistent IRF SN fovea and mac -- slightly improved - Recommend IVA OD #22 today 04.09.24  - pt wishes to proceed with injection - RBA of procedure discussed, questions answered - see procedure note  - Avastin informed consent form re-signed and scanned on 05.19.2023  - f/u around 04.29.28 for possible injection OS  2,3. Hypertensive retinopathy OU  - discussed importance of tight BP control             - continue to monitor   4. Nuclear Sclerosis  - The symptoms of cataract, surgical options, and treatments and risks were discussed with patient.   - discussed diagnosis and progression  - under the expert management of [facility]   5. Dry eyes OU  - likely cause of decreased vision OU today - recommend artificial tears and lubricating ointment as needed  Ophthalmic Meds Ordered this visit:  Meds ordered this encounter  Medications   Bevacizumab (AVASTIN) SOLN 1.25 mg    This document serves as a record of services personally performed by Karie Chimera, MD, PhD. It was created on their behalf by Gerilyn Nestle, COT an ophthalmic technician. The creation of this record is the provider's dictation and/or activities during the visit.    Electronically signed by:  Gerilyn Nestle, COT  04.05.24 4:59 PM  This document serves as  a record of services personally performed by Karie Chimera, MD, PhD. It was created on their behalf by Glee Arvin. Manson Passey, OA an ophthalmic technician. The creation of this record is the provider's dictation and/or activities during the visit.    Electronically signed by: Glee Arvin. Manson Passey, [place] 04.09.2024 4:59 PM  Karie Chimera, M.D., Ph.D. Diseases & Surgery of the Retina and Vitreous [facility]  I have reviewed the above documentation for accuracy and completeness, and I agree with the above. Karie Chimera, M.D., Ph.D. 06/01/22 5:00 PM  Abbreviations: M myopia (nearsighted); A astigmatism; H hyperopia (farsighted); P presbyopia; Mrx spectacle prescription;  CTL contact lenses; OD right eye; OS left eye; OU both eyes  XT exotropia; ET esotropia; PEK punctate epithelial keratitis; PEE punctate epithelial erosions; DES dry eye syndrome; MGD meibomian gland dysfunction; ATs artificial tears; PFAT's preservative free artificial tears; NSC nuclear sclerotic cataract; PSC posterior subcapsular cataract; ERM epi-retinal membrane; PVD posterior vitreous detachment; RD retinal detachment; DM diabetes mellitus; DR diabetic retinopathy; NPDR non-proliferative diabetic retinopathy; PDR proliferative diabetic retinopathy; CSME clinically significant macular edema; DME diabetic macular edema; dbh dot blot hemorrhages; CWS cotton wool spot; POAG primary open angle glaucoma; C/D cup-to-disc ratio; HVF humphrey visual field; GVF goldmann visual field; OCT optical coherence tomography; IOP intraocular pressure; BRVO Branch retinal vein occlusion; CRVO central retinal vein occlusion; CRAO central retinal artery occlusion; BRAO branch retinal artery occlusion; RT retinal tear; SB scleral buckle; PPV pars plana vitrectomy; VH Vitreous hemorrhage; PRP panretinal laser photocoagulation; IVK intravitreal kenalog; VMT vitreomacular traction; MH Macular hole;  NVD neovascularization of the disc; NVE  neovascularization elsewhere; AREDS age related eye disease study; ARMD age related macular degeneration; POAG primary open angle glaucoma; EBMD epithelial/anterior basement membrane dystrophy; ACIOL anterior chamber intraocular lens; IOL intraocular lens; PCIOL posterior chamber intraocular lens; Phaco/IOL phacoemulsification with intraocular lens placement; PRK photorefractive keratectomy; LASIK laser assisted in situ keratomileusis; HTN hypertension; DM diabetes mellitus; COPD chronic obstructive pulmonary disease

## 2022-06-01 ENCOUNTER — Encounter (INDEPENDENT_AMBULATORY_CARE_PROVIDER_SITE_OTHER): Payer: Self-pay | Admitting: Ophthalmology

## 2022-06-01 ENCOUNTER — Ambulatory Visit (INDEPENDENT_AMBULATORY_CARE_PROVIDER_SITE_OTHER): Payer: Medicaid Other | Admitting: Ophthalmology

## 2022-06-01 DIAGNOSIS — I10 Essential (primary) hypertension: Secondary | ICD-10-CM

## 2022-06-01 DIAGNOSIS — H04123 Dry eye syndrome of bilateral lacrimal glands: Secondary | ICD-10-CM

## 2022-06-01 DIAGNOSIS — H2513 Age-related nuclear cataract, bilateral: Secondary | ICD-10-CM

## 2022-06-01 DIAGNOSIS — H35033 Hypertensive retinopathy, bilateral: Secondary | ICD-10-CM | POA: Diagnosis not present

## 2022-06-01 DIAGNOSIS — E113313 Type 2 diabetes mellitus with moderate nonproliferative diabetic retinopathy with macular edema, bilateral: Secondary | ICD-10-CM

## 2022-06-01 MED ORDER — BEVACIZUMAB CHEMO INJECTION 1.25MG/0.05ML SYRINGE FOR KALEIDOSCOPE
1.2500 mg | INTRAVITREAL | Status: AC | PRN
  Administered 2022-06-01: 1.25 mg via INTRAVITREAL

## 2022-06-08 NOTE — Progress Notes (Signed)
[facility] - Clinic Note  06/22/2022     CHIEF COMPLAINT Patient presents for Retina Follow Up  HISTORY OF PRESENT ILLNESS: Ana Long is a 47 y.o. female who presents to the clinic today for:   HPI     Retina Follow Up   Patient presents with  Diabetic Retinopathy.  In both eyes.  This started 4 days ago.  Duration of 4 days.  Since onset it is stable.  I, the attending physician,  performed the HPI with the patient and updated documentation appropriately.        Comments   Patient is complaining of a new floater in the right eye. The vision is the same. Her blood sugar was 151. She is not using any eye drops at this time.       Last edited by Rennis Chris, MD on 06/22/2022  4:32 PM.    Pt states she is seeing a new floater in her right eye   Referring physician: Olivia Canter MD [address] [place], [place] [address]  HISTORICAL INFORMATION:   Selected notes from the MEDICAL RECORD NUMBER Re-referred by Dr. Fabian Sharp for DM exam   CURRENT MEDICATIONS: Current Outpatient Medications (Ophthalmic Drugs)  Medication Sig   Olopatadine HCl 0.2 % SOLN Place one drop into both eyes daily.   No current facility-administered medications for this visit. (Ophthalmic Drugs)   Current Outpatient Medications (Other)  Medication Sig   acyclovir (ZOVIRAX) 400 MG tablet TAKE 1 TABLET BY MOUTH TWICE A DAY   albuterol (PROAIR HFA) 108 (90 Base) MCG/ACT inhaler Inhale 2 puffs into the lungs every 4 (four) hours as needed for wheezing or shortness of breath (cough). (Patient taking differently: Inhale 2 puffs into the lungs every 4 (four) hours as needed for wheezing or shortness of breath (or coughing).)   atorvastatin (LIPITOR) 10 MG tablet Take 10 mg by mouth daily.   benzonatate (TESSALON) 200 MG capsule Take 1 capsule (200 mg total) by mouth 3 (three) times daily as needed for cough.   Blood Glucose Monitoring Suppl (ACCU-CHEK AVIVA PLUS)  w/Device KIT Please check you blood sugars 4 times a day   CIPRODEX OTIC suspension Place into the right ear.   DERMA-SMOOTHE/FS SCALP 0.01 % OIL Apply topically.   fluconazole (DIFLUCAN) 150 MG tablet TAKE 1 TABLET BY MOUTH NOW. THEN REPEAT IN 1 WEEK   furosemide (LASIX) 20 MG tablet Take 20 mg by mouth daily as needed for fluid or edema.   glucose blood (ACCU-CHEK SMARTVIEW) test strip TEST FOUR TIMES DAILY   HUMULIN R U-500 KWIKPEN 500 UNIT/ML kwikpen Inject into the skin.   hydrochlorothiazide (HYDRODIURIL) 12.5 MG tablet Take by mouth.   Insulin Pen Needle (B-D UF III MINI PEN NEEDLES) 31G X 5 MM MISC USE AS DIRECTED TO INJECT INSULIN FIVE A DAY E11.65   lactulose (CEPHULAC) 10 g packet Take 1 packet (10 g total) by mouth daily as needed. Reported on 07/29/2015 (Patient taking differently: Take 10 g by mouth daily as needed (for constipation).)   Lancets (ACCU-CHEK SOFT TOUCH) lancets Use as instructed   loratadine (CLARITIN) 10 MG tablet Take 1 tablet (10 mg total) by mouth 2 (two) times daily.   methocarbamol (ROBAXIN) 500 MG tablet Take 1 tablet (500 mg total) by mouth every 8 (eight) hours as needed.   mometasone-formoterol (DULERA) 200-5 MCG/ACT AERO Inhale 2 puffs into the lungs 2 (two) times daily.   montelukast (SINGULAIR) 10 MG  tablet Take 1 tablet (10 mg total) by mouth at bedtime.   nitroGLYCERIN (NITRODUR - DOSED IN MG/24 HR) 0.2 mg/hr patch Apply 1/4th patch to affected achilles, change daily   omeprazole (PRILOSEC) 20 MG capsule Take 1 capsule (20 mg total) by mouth daily before breakfast.   SPIRIVA RESPIMAT 1.25 MCG/ACT AERS SMARTSIG:2 Puff(s) Via Inhaler Daily   sulfamethoxazole-trimethoprim (BACTRIM DS) 800-160 MG tablet Take 1 tablet by mouth 2 (two) times daily.   topiramate (TOPAMAX) 25 MG tablet Take by mouth.   Current Facility-Administered Medications (Other)  Medication Route   diphenhydrAMINE (BENADRYL) injection 12.5 mg Intramuscular   REVIEW OF  SYSTEMS:    ALLERGIES Allergies  Allergen Reactions   Bee Venom Anaphylaxis and Swelling   Wasp Venom Anaphylaxis and Swelling   Citrus Hives and Itching   Latex Hives and Swelling   Penicillins Hives and Itching    Has patient had a PCN reaction causing immediate rash, facial/tongue/throat swelling, SOB or lightheadedness with hypotension: Yes Has patient had a PCN reaction causing severe rash involving mucus membranes or skin necrosis: Unk Has patient had a PCN reaction that required hospitalization: Was already in hosp Has patient had a PCN reaction occurring within the last 10 years: No If all of the above answers are "NO", then may proceed with Cephalosporin use.    Shellfish Allergy Other (See Comments) and Swelling    Patient is uncertain; stated that she tolerates this (??)   Soap Hives, Itching and Swelling    PUREX LAUNDRY DETERGENT   Tomato Hives and Itching   Banana Itching and Nausea Only   Chocolate Itching   Fluticasone Other (See Comments)    Per patient, makes sneeze and gag   Hydrocodone Itching and Nausea Only   Other Itching    Acidic foods- Itching [place]- Itching in the roof of the mouth   Adhesive [Tape] Itching and Rash    Allergic to  "leads"   Doxycycline Other (See Comments)    AFFECTS JOINTS   Liraglutide Nausea Only   Metformin And Related Nausea Only, Rash, Other (See Comments) and Diarrhea    GI UPSET, also   Red Dye Itching, Rash and Other (See Comments)    At eye doctor's office, the dilating drops- Skin breaks out, chest turned red, itching, face became swollen   PAST MEDICAL HISTORY 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   Cataract    NS OU   Complication of anesthesia    states is hard  to wake up post-op   Constipation 12/30/2014   Diabetes mellitus    intolerant to Metformin   Diabetic retinopathy (HCC)    NPDR OU   Dry skin    hands    Family history of anesthesia complication    mother went into coma during c-section   Genital herpes 01/01/2006   on acyclovir BID    GERD (gastroesophageal reflux disease)    Herpes genital    Hyperglycemia 12/26/2017   Hypertensive retinopathy    OU   Low back pain 11/22/2012   Lower extremity edema 10/18/2012   Menorrhagia 11/06/2015   Morbid obesity with BMI of 50 to 60 07/23/2009   Obesity hypoventilation syndrome (HCC) 10/30/2012  Obesity, morbid (HCC) 01/10/2014   OSA on CPAP     AHI was on  11-17-12 to 120/hr, titrated to 15 cm with 3 cm EPR/    PCOS (polycystic ovarian syndrome)    Rheumatoid arthritis (HCC)    Right ankle pain 06/19/2015   Right hip pain 09/04/2017   Right shoulder pain 06/01/2017   Seasonal allergies    Sleep apnea, obstructive    uses CPAP nightly - had sleep study 08/22/2007   Past Surgical History:  Procedure Laterality Date   CARPAL TUNNEL RELEASE Right 10/02/2012   Procedure: RIGHT CARPAL TUNNEL RELEASE ENDOSCOPIC;  Surgeon: Jodi Marble, MD;  Location: [facility];  Service: Orthopedics;  Laterality: Right;   CHOLECYSTECTOMY N/A 12/02/2014   Procedure: LAPAROSCOPIC CHOLECYSTECTOMY;  Surgeon: Darnell Level, MD;  Location: [facility];  Service: General;  Laterality: N/A;   HYSTEROSCOPY WITH D & C  01-08-2002   FAMILY HISTORY Family History  Problem Relation Age of Onset   Other Mother    Diabetes Maternal Aunt    Heart attack Maternal Aunt    Alzheimer's disease Maternal Uncle    Cancer Maternal Uncle        prostate   Diabetes Paternal Aunt    Cancer Paternal Aunt        brain,mouth   Alzheimer's disease Paternal Aunt    Colon polyps Paternal Uncle    Diabetes Paternal Uncle    Diabetes Maternal Grandmother    Diabetes Maternal Grandfather    Leukemia Cousin    Other Other     Obesity Other    Other Other    Colitis Neg Hx    SOCIAL HISTORY Social History   Tobacco Use   Smoking status: Former    Years: 10    Types: Cigarettes    Quit date: 05/24/2010    Years since quitting: 12.0   Smokeless tobacco: Never  Vaping Use   Vaping Use: Never used  Substance Use Topics   Alcohol use: Yes    Alcohol/week: 0.0 standard drinks of alcohol    Comment: socially   Drug use: No       OPHTHALMIC EXAM: Base Eye Exam     Visual Acuity (Snellen - Linear)       Right Left   Dist cc 20/30 20/40   Dist ph cc NI 20/25    Correction: Glasses         Tonometry (Tonopen, 1:34 PM)       Right Left   Pressure 16 17         Pupils       Dark Light Shape React APD   Right 3 2 Round Brisk None   Left 3 2 Round Brisk None         Visual Fields       Left Right    Full Full         Extraocular Movement       Right Left    Full, Ortho Full, Ortho         Neuro/Psych     Oriented x3: Yes   Mood/Affect: Normal         Dilation     Both eyes: 1.0% Mydriacyl, 2.5% Phenylephrine @ 1:31 PM           Slit Lamp and Fundus Exam     Slit Lamp Exam       Right Left   Lids/Lashes Dermatochalasis - upper lid, mild  Meibomian gland dysfunction Dermatochalasis - upper lid, mild Meibomian gland dysfunction   Conjunctiva/Sclera White and quiet White and quiet   Cornea 2+Punctate epithelial erosions, mild tear film debris 3+fine Punctate epithelial erosions   Anterior Chamber deep and quiet deep and quiet   Iris round and dilated, No NVI round and dilated   Lens 1+ NS, 1+CC 1+NS, 1+CC   Anterior Vitreous mild vitreous syneresis; PVD; mild vit condensations mild vitreous syneresis, scattered vitreous condensations         Fundus Exam       Right Left   Disc Pink and sharp, +cupping Pink and sharp, +cupping   C/D Ratio 0.7 0.6   Macula good foveal reflex, focal exudates ST macula, scattered MA, focal cystic changes ST macula -  persistent, focal laser changes inferior macula good foveal reflex, scatttered MA's and exudate, central cystic changes -- persistent, focal cluster of exudates SN macula -- persistent   Vessels attenuated, Tortuous attenuated, Tortuous   Periphery Attached, scattered MA/exudates greatest nasally Attached, scattered MA and exudate greatest superior to disc, white without pressure temporally           Refraction     Wearing Rx       Sphere Cylinder Axis   Right -2.50 +1.50 013   Left -2.25 Sphere     Type: SVL           IMAGING AND PROCEDURES  Imaging and Procedures for @TODAY @  OCT, Retina - OU - Both Eyes       Right Eye Quality was good. Central Foveal Thickness: 223. Progression has been stable. Findings include normal foveal contour, no SRF, intraretinal hyper-reflective material, intraretinal fluid, vitreomacular adhesion (Persistent IRF/cystic changes temporal fovea and macula ).   Left Eye Quality was good. Central Foveal Thickness: 310. Progression has worsened. Findings include no SRF, abnormal foveal contour, intraretinal hyper-reflective material, intraretinal fluid (Persistent IRF SN fovea and mac -- slightly increased).   Notes *Images captured and stored on drive  Diagnosis / Impression:  +DME OU OD: Persistent IRF/cystic changes temporal fovea and macula  OS: Persistent IRF SN fovea and mac -- slightly increased  Clinical management:  See below  Abbreviations: NFP - Normal foveal profile. CME - cystoid macular edema. PED - pigment epithelial detachment. IRF - intraretinal fluid. SRF - subretinal fluid. EZ - ellipsoid zone. ERM - epiretinal membrane. ORA - outer retinal atrophy. ORT - outer retinal tubulation. SRHM - subretinal hyper-reflective material      Intravitreal Injection, Pharmacologic Agent - OS - Left Eye       Time Out 06/22/2022. 1:44 PM. Confirmed correct patient, procedure, site, and patient consented.   Anesthesia Topical  anesthesia was used. Anesthetic medications included Lidocaine 2%, Proparacaine 0.5%.   Procedure Preparation included 5% betadine to ocular surface, eyelid speculum. A supplied (32g) needle was used.   Injection: 1.25 mg Bevacizumab 1.25mg /0.73ml   Route: Intravitreal, Site: Left Eye   NDC: 16109-604-54, Lot: [id number] , Expiration date: 07/23/2022   Post-op Post injection exam found visual acuity of at least counting fingers. The patient tolerated the procedure well. There were no complications. The patient received written and verbal post procedure care education. Post injection medications were not given.            ASSESSMENT/PLAN:    ICD-10-CM   1. Moderate nonproliferative diabetic retinopathy of both eyes with macular edema associated with type 2 diabetes mellitus (HCC)  E11.3313 OCT, Retina - OU - Both Eyes  Intravitreal Injection, Pharmacologic Agent - OS - Left Eye    Bevacizumab (AVASTIN) SOLN 1.25 mg    2. Current use of insulin (HCC)  Z79.4     3. Long term (current) use of oral hypoglycemic drugs  Z79.84     4. Essential hypertension  I10     5. Hypertensive retinopathy of both eyes  H35.033     6. Nuclear sclerosis of both eyes  H25.13     7. Dry eyes  H04.123      1-3.  Moderate non-proliferative diabetic retinopathy w/ DME, OU  - pt lost to f/u from 10.23.2019 to 11.18.20  - FA in 2019 showed leaking MA OU; no NV OU **of note, pt had pruritic rxn to fluorescein dye -- resolved with 12.5 mg IV benadryl**  - FA 12.16.2020 shows late leaking MA's, non-perfusion defects; no NV OU - s/p IVA OD #1 (12.16.20), #2 (01.25.21), #3 (05.26.21), #4 (07.02.21), #5 (10.15.21), #6 (11.23.21), #7 (07.27.22), #8 (09.09.22), #9 (11.11.22), #10 (01.20.23), #11 (02.23.23), #12 (04.09.23), #13 (05.23.23), #14 (06.30.23), #15 (08.04.23), #16 (09.01.23), #17 (10.06.23), #18 (11.10.23) #19 (12.15.23), #20 (01.19.24), #21 (02.22.24), #22 (04.09.24)  - s/p focal laser OD  (03.19.21), (04.01.22) - s/p IVA OS #1 (12.23.20), #2 (01.27.21), #3 (03.02.21), #4 (05.26.21), #5 (07.02.21), #6 (10.15.21), #7 (11.23.21), #8 (01.11.22), #9 (03.04.22), #10 (04.27.22), #11 (11.14.22), #12 (01.10.23), #13 (02.20.23), #14 (04.05.23), #15 (05.19.23), #16 (06.23.23), #17 (07.28.23), #18 (09.01.23), #19 (10.06.23), #20 (11.10.23) #21 (12.15.23), #22 (01.19.24), #23 (02.22.24), #24 (04.01.24)  - exam shows scattered MA, IRH, exudates and mild macular edema OU  - BCVA 20/30 OD, 20/25 OS - OCT shows OD: Persistent IRF/cystic changes temporal fovea and macula; OS: Persistent IRF SN fovea and mac -- slightly increased - Recommend IVA OS #25 today 04.30.24  - pt wishes to proceed with injection - RBA of procedure discussed, questions answered - see procedure note  - Avastin informed consent form re-signed and scanned on 05.19.2023  - f/u 4 weeks, DFE, OCT, possible injection(s)  4,5. Hypertensive retinopathy OU  - discussed importance of tight BP control             - continue to monitor   6. Nuclear Sclerosis  - The symptoms of cataract, surgical options, and treatments and risks were discussed with patient.   - discussed diagnosis and progression  - under the expert management of [facility]   5. Dry eyes OU  - likely cause of decreased vision OU today - recommend artificial tears and lubricating ointment as needed  Ophthalmic Meds Ordered this visit:  Meds ordered this encounter  Medications   Bevacizumab (AVASTIN) SOLN 1.25 mg    This document serves as a record of services personally performed by Karie Chimera, MD, PhD. It was created on their behalf by Gerilyn Nestle, COT an ophthalmic technician. The creation of this record is the provider's dictation and/or activities during the visit.    Electronically signed by:  Gerilyn Nestle, COT  04.16.24 4:33 PM  This document serves as a record of services personally performed by Karie Chimera, MD, PhD. It was  created on their behalf by Glee Arvin. Manson Passey, OA an ophthalmic technician. The creation of this record is the provider's dictation and/or activities during the visit.    Electronically signed by: Glee Arvin. Manson Passey, [place] 04.30.2024 4:33 PM   Karie Chimera, M.D., Ph.D. Diseases & Surgery of the Retina and Vitreous [facility]  I have  reviewed the above documentation for accuracy and completeness, and I agree with the above. Karie Chimera, M.D., Ph.D. 06/22/22 4:34 PM   Abbreviations: M myopia (nearsighted); A astigmatism; H hyperopia (farsighted); P presbyopia; Mrx spectacle prescription;  CTL contact lenses; OD right eye; OS left eye; OU both eyes  XT exotropia; ET esotropia; PEK punctate epithelial keratitis; PEE punctate epithelial erosions; DES dry eye syndrome; MGD meibomian gland dysfunction; ATs artificial tears; PFAT's preservative free artificial tears; NSC nuclear sclerotic cataract; PSC posterior subcapsular cataract; ERM epi-retinal membrane; PVD posterior vitreous detachment; RD retinal detachment; DM diabetes mellitus; DR diabetic retinopathy; NPDR non-proliferative diabetic retinopathy; PDR proliferative diabetic retinopathy; CSME clinically significant macular edema; DME diabetic macular edema; dbh dot blot hemorrhages; CWS cotton wool spot; POAG primary open angle glaucoma; C/D cup-to-disc ratio; HVF humphrey visual field; GVF goldmann visual field; OCT optical coherence tomography; IOP intraocular pressure; BRVO Branch retinal vein occlusion; CRVO central retinal vein occlusion; CRAO central retinal artery occlusion; BRAO branch retinal artery occlusion; RT retinal tear; SB scleral buckle; PPV pars plana vitrectomy; VH Vitreous hemorrhage; PRP panretinal laser photocoagulation; IVK intravitreal kenalog; VMT vitreomacular traction; MH Macular hole;  NVD neovascularization of the disc; NVE neovascularization elsewhere; AREDS age related eye disease study; ARMD age  related macular degeneration; POAG primary open angle glaucoma; EBMD epithelial/anterior basement membrane dystrophy; ACIOL anterior chamber intraocular lens; IOL intraocular lens; PCIOL posterior chamber intraocular lens; Phaco/IOL phacoemulsification with intraocular lens placement; PRK photorefractive keratectomy; LASIK laser assisted in situ keratomileusis; HTN hypertension; DM diabetes mellitus; COPD chronic obstructive pulmonary disease

## 2022-06-22 ENCOUNTER — Emergency Department (HOSPITAL_BASED_OUTPATIENT_CLINIC_OR_DEPARTMENT_OTHER): Payer: Medicaid Other

## 2022-06-22 ENCOUNTER — Ambulatory Visit (INDEPENDENT_AMBULATORY_CARE_PROVIDER_SITE_OTHER): Payer: Medicaid Other | Admitting: Ophthalmology

## 2022-06-22 ENCOUNTER — Other Ambulatory Visit: Payer: Self-pay

## 2022-06-22 ENCOUNTER — Encounter (INDEPENDENT_AMBULATORY_CARE_PROVIDER_SITE_OTHER): Payer: Self-pay | Admitting: Ophthalmology

## 2022-06-22 ENCOUNTER — Encounter (HOSPITAL_BASED_OUTPATIENT_CLINIC_OR_DEPARTMENT_OTHER): Payer: Self-pay | Admitting: Emergency Medicine

## 2022-06-22 DIAGNOSIS — I10 Essential (primary) hypertension: Secondary | ICD-10-CM | POA: Diagnosis not present

## 2022-06-22 DIAGNOSIS — M79644 Pain in right finger(s): Secondary | ICD-10-CM | POA: Diagnosis present

## 2022-06-22 DIAGNOSIS — Z794 Long term (current) use of insulin: Secondary | ICD-10-CM | POA: Diagnosis not present

## 2022-06-22 DIAGNOSIS — E113313 Type 2 diabetes mellitus with moderate nonproliferative diabetic retinopathy with macular edema, bilateral: Secondary | ICD-10-CM

## 2022-06-22 DIAGNOSIS — Z7984 Long term (current) use of oral hypoglycemic drugs: Secondary | ICD-10-CM | POA: Diagnosis not present

## 2022-06-22 DIAGNOSIS — H35033 Hypertensive retinopathy, bilateral: Secondary | ICD-10-CM

## 2022-06-22 DIAGNOSIS — H2513 Age-related nuclear cataract, bilateral: Secondary | ICD-10-CM

## 2022-06-22 DIAGNOSIS — Z91040 Latex allergy status: Secondary | ICD-10-CM | POA: Insufficient documentation

## 2022-06-22 DIAGNOSIS — H04123 Dry eye syndrome of bilateral lacrimal glands: Secondary | ICD-10-CM

## 2022-06-22 DIAGNOSIS — M19041 Primary osteoarthritis, right hand: Secondary | ICD-10-CM | POA: Diagnosis not present

## 2022-06-22 MED ORDER — BEVACIZUMAB CHEMO INJECTION 1.25MG/0.05ML SYRINGE FOR KALEIDOSCOPE
1.2500 mg | INTRAVITREAL | Status: AC | PRN
  Administered 2022-06-22: 1.25 mg via INTRAVITREAL

## 2022-06-22 NOTE — ED Triage Notes (Signed)
Patient arrived via POV c/o right index finger injury x 1 week. Patient states ongoing issue with finger x 7 months. Patient states unable to bend finger and it is swollen more then others. Patient states 6/10 pain. Patient is AO x 4, VS WDL normal gait.

## 2022-06-23 ENCOUNTER — Emergency Department (HOSPITAL_BASED_OUTPATIENT_CLINIC_OR_DEPARTMENT_OTHER)
Admission: EM | Admit: 2022-06-23 | Discharge: 2022-06-23 | Disposition: A | Discharge status: Home / Self Care | Payer: Medicaid Other | Attending: Emergency Medicine | Admitting: Emergency Medicine

## 2022-06-23 DIAGNOSIS — M1990 Unspecified osteoarthritis, unspecified site: Secondary | ICD-10-CM

## 2022-06-23 MED ORDER — DICLOFENAC SODIUM ER 100 MG PO TB24: 100.0000 mg | ORAL_TABLET | Freq: Every day | ORAL | 0 refills | Status: DC

## 2022-06-23 MED ORDER — NAPROXEN 250 MG PO TABS
500.0000 mg | ORAL_TABLET | Freq: Once | ORAL | Status: AC
  Administered 2022-06-23: 500 mg via ORAL
  Filled 2022-06-23: qty 2

## 2022-06-23 NOTE — ED Provider Notes (Signed)
[place] EMERGENCY DEPARTMENT AT [facility] [place] Provider Note   CSN: [id number] Arrival date & time: 06/22/22  2002     History  Chief Complaint  Patient presents with   Finger Injury    Ana Long is a 47 y.o. female.  The history is provided by the patient.  Hand Pain This is a chronic (7 months worse x 1 week) problem. The current episode started more than 1 week ago. The problem occurs constantly. The problem has not changed since onset.Pertinent negatives include no chest pain, no abdominal pain, no headaches and no shortness of breath. Nothing aggravates the symptoms. Nothing relieves the symptoms. The treatment provided no relief.  Patient with ongoing right index finger pain      Home Medications Prior to Admission medications   Medication Sig Start Date End Date Taking? Authorizing Provider  Diclofenac Sodium CR 100 MG 24 hr tablet Take 1 tablet (100 mg total) by mouth daily. 06/23/22  Yes Lashanna Angelo, MD  acyclovir (ZOVIRAX) 400 MG tablet TAKE 1 TABLET BY MOUTH TWICE A DAY 03/06/18   Helberg, Jill Alexanders, MD  albuterol (PROAIR HFA) 108 (90 Base) MCG/ACT inhaler Inhale 2 puffs into the lungs every 4 (four) hours as needed for wheezing or shortness of breath (cough). Patient taking differently: Inhale 2 puffs into the lungs every 4 (four) hours as needed for wheezing or shortness of breath (or coughing). 05/24/16   Carolynn Comment, MD  atorvastatin (LIPITOR) 10 MG tablet Take 10 mg by mouth daily. 05/09/20   [provider]  benzonatate (TESSALON) 200 MG capsule Take 1 capsule (200 mg total) by mouth 3 (three) times daily as needed for cough. 01/15/18   Levora Dredge, MD  Blood Glucose Monitoring Suppl (ACCU-CHEK AVIVA PLUS) w/Device KIT Please check you blood sugars 4 times a day 01/15/18   Levora Dredge, MD  CIPRODEX OTIC suspension Place into the right ear. 04/01/21   [provider]  DERMA-SMOOTHE/FS SCALP 0.01 % OIL Apply topically.  12/12/19   [provider]  fluconazole (DIFLUCAN) 150 MG tablet TAKE 1 TABLET BY MOUTH NOW. THEN REPEAT IN 1 WEEK 09/13/18   [provider]  furosemide (LASIX) 20 MG tablet Take 20 mg by mouth daily as needed for fluid or edema. 07/27/18   [provider]  glucose blood (ACCU-CHEK SMARTVIEW) test strip TEST FOUR TIMES DAILY 01/15/18   Levora Dredge, MD  HUMULIN R U-500 KWIKPEN 500 UNIT/ML kwikpen Inject into the skin. 05/21/20   [provider]  hydrochlorothiazide (HYDRODIURIL) 12.5 MG tablet Take by mouth. 03/20/19   [provider]  Insulin Pen Needle (B-D UF III MINI PEN NEEDLES) 31G X 5 MM MISC USE AS DIRECTED TO INJECT INSULIN FIVE A DAY E11.65 01/24/18   Helberg, Jill Alexanders, MD  lactulose (CEPHULAC) 10 g packet Take 1 packet (10 g total) by mouth daily as needed. Reported on 07/29/2015 Patient taking differently: Take 10 g by mouth daily as needed (for constipation). 09/05/17   Inez Catalina, MD  Lancets (ACCU-CHEK SOFT [facility]) lancets Use as instructed 01/15/18   Levora Dredge, MD  loratadine (CLARITIN) 10 MG tablet Take 1 tablet (10 mg total) by mouth 2 (two) times daily. 01/27/18   Levora Dredge, MD  methocarbamol (ROBAXIN) 500 MG tablet Take 1 tablet (500 mg total) by mouth every 8 (eight) hours as needed. 10/20/17   Hudnall, Azucena Fallen, MD  mometasone-formoterol (DULERA) 200-5 MCG/ACT AERO Inhale 2 puffs into the lungs 2 (two) times daily.  03/01/18   Helberg, Jill Alexanders, MD  montelukast (SINGULAIR) 10 MG tablet Take 1 tablet (10 mg total) by mouth at bedtime. 01/15/18   Levora Dredge, MD  nitroGLYCERIN (NITRODUR - DOSED IN MG/24 HR) 0.2 mg/hr patch Apply 1/4th patch to affected achilles, change daily 07/21/17   Hudnall, Azucena Fallen, MD  Olopatadine HCl 0.2 % SOLN Place one drop into both eyes daily. 12/12/19   [provider]  omeprazole (PRILOSEC) 20 MG capsule Take 1 capsule (20 mg total) by mouth daily before breakfast. 09/05/17   Inez Catalina, MD   SPIRIVA RESPIMAT 1.25 MCG/ACT AERS SMARTSIG:2 Puff(s) Via Inhaler Daily 01/09/20   [provider]  sulfamethoxazole-trimethoprim (BACTRIM DS) 800-160 MG tablet Take 1 tablet by mouth 2 (two) times daily. 03/10/22   Hudnall, Azucena Fallen, MD  topiramate (TOPAMAX) 25 MG tablet Take by mouth.    [provider]      Allergies    Bee venom, Wasp venom, Citrus, Latex, Penicillins, Shellfish allergy, Soap, Tomato, Banana, Chocolate, Fluticasone, Hydrocodone, Other, Adhesive [tape], Doxycycline, Liraglutide, Metformin and related, and Red dye    Review of Systems   Review of Systems  Constitutional:  Negative for fever.  Respiratory:  Negative for shortness of breath.   Cardiovascular:  Negative for chest pain.  Gastrointestinal:  Negative for abdominal pain.  Musculoskeletal:  Positive for arthralgias.  Neurological:  Negative for headaches.  All other systems reviewed and are negative.   Physical Exam Updated Vital Signs BP (!) 148/88 (BP Location: Right Arm)   Pulse 80   Temp 97.8 F (36.6 C)   Resp 20   Ht 5\' 2"  (1.575 m)   Wt (!) 140.6 kg   SpO2 96%   BMI 56.70 kg/m  Physical Exam Vitals and nursing note reviewed.  Constitutional:      General: She is not in acute distress.    Appearance: Normal appearance. She is well-developed.  HENT:     Head: Normocephalic and atraumatic.     Nose: Nose normal.  Eyes:     Pupils: Pupils are equal, round, and reactive to light.  Cardiovascular:     Rate and Rhythm: Normal rate and regular rhythm.     Pulses: Normal pulses.     Heart sounds: Normal heart sounds.  Pulmonary:     Effort: Pulmonary effort is normal. No respiratory distress.     Breath sounds: Normal breath sounds.  Abdominal:     General: Bowel sounds are normal. There is no distension.     Palpations: Abdomen is soft.     Tenderness: There is no abdominal tenderness. There is no guarding or rebound.  Genitourinary:    Vagina: No vaginal discharge.   Musculoskeletal:        General: Normal range of motion.     Right hand: No swelling, deformity or lacerations. Normal range of motion. Normal strength. Normal sensation. Normal capillary refill. Normal pulse.     Cervical back: Neck supple.  Skin:    General: Skin is warm and dry.     Capillary Refill: Capillary refill takes less than 2 seconds.     Findings: No erythema or rash.  Neurological:     General: No focal deficit present.     Mental Status: She is alert.     Deep Tendon Reflexes: Reflexes normal.  Psychiatric:        Mood and Affect: Mood normal.     ED Results / Procedures / Treatments   Labs (all  labs ordered are listed, but only abnormal results are displayed) Labs Reviewed - No data to display  EKG None  Radiology DG Finger Index Right  Result Date: 06/22/2022 CLINICAL DATA:  Right index finger injury for 1 week. Also right index finger pain for several months. EXAM: RIGHT INDEX FINGER 2+V COMPARISON:  Right hand radiographs 01/20/2022 FINDINGS: Normal bone mineralization. Joint spaces are preserved. Minimal degenerative spurring at the medial/ulnar aspect of the index finger metacarpophalangeal joint without joint space narrowing. No acute fracture or dislocation. IMPRESSION: Minimal degenerative spurring at the index finger metacarpophalangeal joint. No acute fracture. Electronically Signed   By: Neita Garnet M.D.   On: 06/22/2022 21:10   Intravitreal Injection, Pharmacologic Agent - OS - Left Eye  Result Date: 06/22/2022 Time Out 06/22/2022. 1:44 PM. Confirmed correct patient, procedure, site, and patient consented. Anesthesia Topical anesthesia was used. Anesthetic medications included Lidocaine 2%, Proparacaine 0.5%. Procedure Preparation included 5% betadine to ocular surface, eyelid speculum. A supplied (32g) needle was used. Injection: 1.25 mg Bevacizumab 1.25mg /0.20ml   Route: Intravitreal, Site: Left Eye   NDC: 16109-604-54, Lot: [id number] , Expiration  date: 07/23/2022 Post-op Post injection exam found visual acuity of at least counting fingers. The patient tolerated the procedure well. There were no complications. The patient received written and verbal post procedure care education. Post injection medications were not given.   OCT, Retina - OU - Both Eyes  Result Date: 06/22/2022 Right Eye Quality was good. Central Foveal Thickness: 223. Progression has been stable. Findings include normal foveal contour, no SRF, intraretinal hyper-reflective material, intraretinal fluid, vitreomacular adhesion (Persistent IRF/cystic changes temporal fovea and macula ). Left Eye Quality was good. Central Foveal Thickness: 310. Progression has worsened. Findings include no SRF, abnormal foveal contour, intraretinal hyper-reflective material, intraretinal fluid (Persistent IRF SN fovea and mac -- slightly increased). Notes *Images captured and stored on drive Diagnosis / Impression: +DME OU OD: Persistent IRF/cystic changes temporal fovea and macula OS: Persistent IRF SN fovea and mac -- slightly increased Clinical management: See below Abbreviations: NFP - Normal foveal profile. CME - cystoid macular edema. PED - pigment epithelial detachment. IRF - intraretinal fluid. SRF - subretinal fluid. EZ - ellipsoid zone. ERM - epiretinal membrane. ORA - outer retinal atrophy. ORT - outer retinal tubulation. SRHM - subretinal hyper-reflective material    Procedures Procedures    Medications Ordered in ED Medications  naproxen (NAPROSYN) tablet 500 mg (has no administration in time range)    ED Course/ Medical Decision Making/ A&P                             Medical Decision Making Patient with ongoing right index pain, her with her daughter who is also a patient   Amount and/or Complexity of Data Reviewed External Data Reviewed: notes.    Details: Previous notes reviewed  Radiology: ordered.    Details: No fracture by me on XR  Risk Prescription drug  management. Risk Details: Will start voltaren gel, follow up with PMD.  Stable for discharge.      Final Clinical Impression(s) / ED Diagnoses Final diagnoses:  Arthritis   Return for intractable cough, coughing up blood, fevers > 100.4 unrelieved by medication, shortness of breath, intractable vomiting, chest pain, shortness of breath, weakness, numbness, changes in speech, facial asymmetry, abdominal pain, passing out, Inability to tolerate liquids or food, cough, altered mental status or any concerns. No signs of systemic illness or infection. The patient is  nontoxic-appearing on exam and vital signs are within normal limits.  I have reviewed the triage vital signs and the nursing notes. Pertinent labs & imaging results that were available during my care of the patient were reviewed by me and considered in my medical decision making (see chart for details). After history, exam, and medical workup I feel the patient has been appropriately medically screened and is safe for discharge home. Pertinent diagnoses were discussed with the patient. Patient was given return precautions. Rx / DC Orders ED Discharge Orders          Ordered    Diclofenac Sodium CR 100 MG 24 hr tablet  Daily        06/23/22 0135              Carle Fenech, MD 06/23/22 0149

## 2022-07-07 ENCOUNTER — Ambulatory Visit: Payer: Medicaid Other | Admitting: Family Medicine

## 2022-07-07 VITALS — BP 120/64 | Ht 62.25 in | Wt 311.0 lb

## 2022-07-07 DIAGNOSIS — M545 Low back pain, unspecified: Secondary | ICD-10-CM

## 2022-07-07 DIAGNOSIS — M79641 Pain in right hand: Secondary | ICD-10-CM

## 2022-07-07 MED ORDER — CYCLOBENZAPRINE HCL 10 MG PO TABS: 10.0000 mg | ORAL_TABLET | Freq: Three times a day (TID) | ORAL | 1 refills | Status: AC | PRN

## 2022-07-07 MED ORDER — DICLOFENAC SODIUM 75 MG PO TBEC: 75.0000 mg | DELAYED_RELEASE_TABLET | Freq: Two times a day (BID) | ORAL | 1 refills | Status: AC

## 2022-07-07 NOTE — Patient Instructions (Signed)
You have arthritis and inflammation of the 2nd MCP joint. Try diclofenac 75mg  twice a day with food for pain and inflammation. Icing or heat, whichever feels better.  For your back pain start physical therapy - do home exercises on days you don't go to therapy. Flexeril as needed up to three times a day (don't take during the day if this makes you sleepy). Follow up with me in 5-6 weeks.

## 2022-07-08 ENCOUNTER — Encounter: Payer: Self-pay | Admitting: Family Medicine

## 2022-07-08 NOTE — Progress Notes (Signed)
PCP: Macy Mis, MD  Subjective:   HPI: Patient is a 47 y.o. female here for right index finger, low back pain.  Patient still with pain at base of right index finger. Dermatology did not think she had psoriasis and radiographs with osteoarthritis but no erosive findings at 2nd MCP. No new injuries to this. No catching or locking but difficulty flexing this. Also with low back pain past several weeks, feeling like low back getting stuck. Bothers with both flexion and extension. Worse when standing on concrete or walking on this surface like in [employer]. She's trying to get bariatric surgery so cannot have steroid injections at this time.  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   Cataract    NS OU   Complication of anesthesia    states is hard to wake up post-op   Constipation 12/30/2014   Diabetes mellitus    intolerant to Metformin   Diabetic retinopathy (HCC)    NPDR OU   Dry skin    hands    Family history of anesthesia complication    mother went into coma during c-section   Genital herpes 01/01/2006   on acyclovir BID    GERD (gastroesophageal reflux disease)    Herpes genital    Hyperglycemia 12/26/2017   Hypertensive retinopathy    OU   Low back pain 11/22/2012   Lower extremity edema 10/18/2012   Menorrhagia 11/06/2015   Morbid obesity with BMI of 50 to 60 07/23/2009   Obesity hypoventilation syndrome (HCC) 10/30/2012   Obesity, morbid (HCC) 01/10/2014   OSA on CPAP     AHI was on  11-17-12 to 120/hr, titrated to 15 cm with 3 cm EPR/    PCOS (polycystic ovarian syndrome)    Rheumatoid arthritis (HCC)    Right ankle pain 06/19/2015   Right hip pain 09/04/2017   Right shoulder pain 06/01/2017   Seasonal allergies    Sleep  apnea, obstructive    uses CPAP nightly - had sleep study 08/22/2007    Current Outpatient Medications on File Prior to Visit  Medication Sig Dispense Refill   acyclovir (ZOVIRAX) 400 MG tablet TAKE 1 TABLET BY MOUTH TWICE A DAY 60 tablet 2   albuterol (PROAIR HFA) 108 (90 Base) MCG/ACT inhaler Inhale 2 puffs into the lungs every 4 (four) hours as needed for wheezing or shortness of breath (cough). (Patient taking differently: Inhale 2 puffs into the lungs every 4 (four) hours as needed for wheezing or shortness of breath (or coughing).) 6.7 g 2   atorvastatin (LIPITOR) 10 MG tablet Take 10 mg by mouth daily.     benzonatate (TESSALON) 200 MG capsule Take 1 capsule (200 mg total) by mouth 3 (three) times daily as needed for cough. 20 capsule 0   Blood Glucose Monitoring Suppl (ACCU-CHEK AVIVA PLUS) w/Device KIT Please check you blood sugars 4 times a day 1 kit 0   CIPRODEX OTIC suspension Place into the right ear.     DERMA-SMOOTHE/FS SCALP 0.01 % OIL Apply topically.     fluconazole (DIFLUCAN) 150 MG tablet TAKE 1 TABLET BY MOUTH NOW. THEN REPEAT IN 1 WEEK     furosemide (LASIX) 20 MG tablet Take 20 mg by mouth  daily as needed for fluid or edema.     glucose blood (ACCU-CHEK SMARTVIEW) test strip TEST FOUR TIMES DAILY 150 each 6   HUMULIN R U-500 KWIKPEN 500 UNIT/ML kwikpen Inject into the skin.     hydrochlorothiazide (HYDRODIURIL) 12.5 MG tablet Take by mouth.     Insulin Pen Needle (B-D UF III MINI PEN NEEDLES) 31G X 5 MM MISC USE AS DIRECTED TO INJECT INSULIN FIVE A DAY E11.65 100 each 6   lactulose (CEPHULAC) 10 g packet Take 1 packet (10 g total) by mouth daily as needed. Reported on 07/29/2015 (Patient taking differently: Take 10 g by mouth daily as needed (for constipation).) 30 each 0   Lancets (ACCU-CHEK SOFT TOUCH) lancets Use as instructed 100 each 12   loratadine (CLARITIN) 10 MG tablet Take 1 tablet (10 mg total) by mouth 2 (two) times daily. 180 tablet 1   mometasone-formoterol  (DULERA) 200-5 MCG/ACT AERO Inhale 2 puffs into the lungs 2 (two) times daily. 13 Inhaler 1   montelukast (SINGULAIR) 10 MG tablet Take 1 tablet (10 mg total) by mouth at bedtime. 90 tablet 1   nitroGLYCERIN (NITRODUR - DOSED IN MG/24 HR) 0.2 mg/hr patch Apply 1/4th patch to affected achilles, change daily 30 patch 1   Olopatadine HCl 0.2 % SOLN Place one drop into both eyes daily.     omeprazole (PRILOSEC) 20 MG capsule Take 1 capsule (20 mg total) by mouth daily before breakfast. 90 capsule 2   SPIRIVA RESPIMAT 1.25 MCG/ACT AERS SMARTSIG:2 Puff(s) Via Inhaler Daily     sulfamethoxazole-trimethoprim (BACTRIM DS) 800-160 MG tablet Take 1 tablet by mouth 2 (two) times daily. 20 tablet 0   topiramate (TOPAMAX) 25 MG tablet Take by mouth.     Current Facility-Administered Medications on File Prior to Visit  Medication Dose Route Frequency Provider Last Rate Last Admin   diphenhydrAMINE (BENADRYL) injection 12.5 mg  12.5 mg Intramuscular Once Rennis Chris, MD        Past Surgical History:  Procedure Laterality Date   CARPAL TUNNEL RELEASE Right 10/02/2012   Procedure: RIGHT CARPAL TUNNEL RELEASE ENDOSCOPIC;  Surgeon: Jodi Marble, MD;  Location: [facility];  Service: Orthopedics;  Laterality: Right;   CHOLECYSTECTOMY N/A 12/02/2014   Procedure: LAPAROSCOPIC CHOLECYSTECTOMY;  Surgeon: Darnell Level, MD;  Location: [facility];  Service: General;  Laterality: N/A;   HYSTEROSCOPY WITH D & C  01-08-2002    Allergies  Allergen Reactions   Bee Venom Anaphylaxis and Swelling   Wasp Venom Anaphylaxis and Swelling   Citrus Hives and Itching   Latex Hives and Swelling   Penicillins Hives and Itching    Has patient had a PCN reaction causing immediate rash, facial/tongue/throat swelling, SOB or lightheadedness with hypotension: Yes Has patient had a PCN reaction causing severe rash involving mucus membranes or skin necrosis: Unk Has patient had a PCN reaction that required  hospitalization: Was already in hosp Has patient had a PCN reaction occurring within the last 10 years: No If all of the above answers are "NO", then may proceed with Cephalosporin use.    Shellfish Allergy Other (See Comments) and Swelling    Patient is uncertain; stated that she tolerates this (??)   Soap Hives, Itching and Swelling    PUREX LAUNDRY DETERGENT   Tomato Hives and Itching   Banana Itching and Nausea Only   Chocolate Itching   Fluticasone Other (See Comments)    Per patient, makes sneeze and gag  Hydrocodone Itching and Nausea Only   Other Itching    Acidic foods- Itching [place]- Itching in the roof of the mouth   Adhesive [Tape] Itching and Rash    Allergic to  "leads"   Doxycycline Other (See Comments)    AFFECTS JOINTS   Liraglutide Nausea Only   Metformin And Related Nausea Only, Rash, Other (See Comments) and Diarrhea    GI UPSET, also   Red Dye Itching, Rash and Other (See Comments)    At eye doctor's office, the dilating drops- Skin breaks out, chest turned red, itching, face became swollen    BP 120/64   Ht 5' 2.25" (1.581 m)   Wt (!) 311 lb (141.1 kg)   BMI 56.43 kg/m       No data to display              No data to display              Objective:  Physical Exam:  Gen: NAD, comfortable in exam room  Right 2nd digit: Mild swelling circumferentially about 2nd MCP.  No malrotation or angulation.  No nodule A1 pulley area. TTP circumferentially about 2nd MCP.  No tenderness at A1. Full extension at MCP, PIP, DIP joints.  Flexion limited at MCP to 45 degrees. Collateral ligaments intact. NVI distally.  Back: No gross deformity, scoliosis. TTP bilateral paraspinal lumbar regions.  No midline or bony TTP. FROM - pain flexion and extension. Strength LEs 5/5 all muscle groups.   Trace MSRs in patellar and achilles tendons, equal bilaterally. Negative SLRs. Sensation intact to light touch bilaterally.   Assessment & Plan:  1.  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  Relative rest.  Consider injection but advised not to get within 3 months of her probable bariatric surgery.  2. Low back pain - start physical therapy.  Diclofenac with flexeril as needed.  F/u in 5-6 weeks.

## 2022-07-12 NOTE — Progress Notes (Signed)
[facility] - Clinic Note  07/20/2022     CHIEF COMPLAINT Patient presents for Retina Follow Up  HISTORY OF PRESENT ILLNESS: Ana Long is a 47 y.o. female who presents to the clinic today for:   HPI     Retina Follow Up   Patient presents with  Diabetic Retinopathy.  This started 4 weeks ago.  Duration of 4 weeks.  Since onset it is stable.  I, the attending physician,  performed the HPI with the patient and updated documentation appropriately.        Comments   4 week retina follow up NPDR and IVA OS pt is reporting stable vision she denies any flashes or floaters her last reading was 200      Last edited by Rennis Chris, MD on 07/22/2022  5:27 PM.    Pt saw Dr. Fabian Sharp this morning and got a new glasses rx, she states her blood pressure and blood sugar have been okay  Referring physician: Olivia Canter MD [address] [place], [place] [address]  HISTORICAL INFORMATION:   Selected notes from the MEDICAL RECORD NUMBER Re-referred by Dr. Fabian Sharp for DM exam   CURRENT MEDICATIONS: Current Outpatient Medications (Ophthalmic Drugs)  Medication Sig   Olopatadine HCl 0.2 % SOLN Place one drop into both eyes daily.   No current facility-administered medications for this visit. (Ophthalmic Drugs)   Current Outpatient Medications (Other)  Medication Sig   acyclovir (ZOVIRAX) 400 MG tablet TAKE 1 TABLET BY MOUTH TWICE A DAY   albuterol (PROAIR HFA) 108 (90 Base) MCG/ACT inhaler Inhale 2 puffs into the lungs every 4 (four) hours as needed for wheezing or shortness of breath (cough). (Patient taking differently: Inhale 2 puffs into the lungs every 4 (four) hours as needed for wheezing or shortness of breath (or coughing).)   atorvastatin (LIPITOR) 10 MG tablet Take 10 mg by mouth daily.   benzonatate (TESSALON) 200 MG capsule Take 1 capsule (200 mg total) by mouth 3 (three) times daily as needed for cough.   Blood Glucose Monitoring Suppl  (ACCU-CHEK AVIVA PLUS) w/Device KIT Please check you blood sugars 4 times a day   CIPRODEX OTIC suspension Place into the right ear.   cyclobenzaprine (FLEXERIL) 10 MG tablet Take 1 tablet (10 mg total) by mouth 3 (three) times daily as needed for muscle spasms.   DERMA-SMOOTHE/FS SCALP 0.01 % OIL Apply topically.   diclofenac (VOLTAREN) 75 MG EC tablet Take 1 tablet (75 mg total) by mouth 2 (two) times daily.   fluconazole (DIFLUCAN) 150 MG tablet TAKE 1 TABLET BY MOUTH NOW. THEN REPEAT IN 1 WEEK   furosemide (LASIX) 20 MG tablet Take 20 mg by mouth daily as needed for fluid or edema.   glucose blood (ACCU-CHEK SMARTVIEW) test strip TEST FOUR TIMES DAILY   HUMULIN R U-500 KWIKPEN 500 UNIT/ML kwikpen Inject into the skin.   hydrochlorothiazide (HYDRODIURIL) 12.5 MG tablet Take by mouth.   Insulin Pen Needle (B-D UF III MINI PEN NEEDLES) 31G X 5 MM MISC USE AS DIRECTED TO INJECT INSULIN FIVE A DAY E11.65   lactulose (CEPHULAC) 10 g packet Take 1 packet (10 g total) by mouth daily as needed. Reported on 07/29/2015 (Patient taking differently: Take 10 g by mouth daily as needed (for constipation).)   Lancets (ACCU-CHEK SOFT TOUCH) lancets Use as instructed   loratadine (CLARITIN) 10 MG tablet Take 1 tablet (10 mg total) by mouth 2 (two) times daily.  mometasone-formoterol (DULERA) 200-5 MCG/ACT AERO Inhale 2 puffs into the lungs 2 (two) times daily.   montelukast (SINGULAIR) 10 MG tablet Take 1 tablet (10 mg total) by mouth at bedtime.   nitroGLYCERIN (NITRODUR - DOSED IN MG/24 HR) 0.2 mg/hr patch Apply 1/4th patch to affected achilles, change daily   omeprazole (PRILOSEC) 20 MG capsule Take 1 capsule (20 mg total) by mouth daily before breakfast.   SPIRIVA RESPIMAT 1.25 MCG/ACT AERS SMARTSIG:2 Puff(s) Via Inhaler Daily   sulfamethoxazole-trimethoprim (BACTRIM DS) 800-160 MG tablet Take 1 tablet by mouth 2 (two) times daily.   topiramate (TOPAMAX) 25 MG tablet Take by mouth.   Current  Facility-Administered Medications (Other)  Medication Route   diphenhydrAMINE (BENADRYL) injection 12.5 mg Intramuscular   REVIEW OF SYSTEMS: ROS   Positive for: Endocrine, Eyes, Respiratory Negative for: Constitutional, Gastrointestinal, Neurological, Skin, Genitourinary, Musculoskeletal, HENT, Cardiovascular, Psychiatric, Allergic/Imm, Heme/Lymph Last edited by Etheleen Mayhew, COT on 07/20/2022 12:58 PM.     ALLERGIES Allergies  Allergen Reactions   Bee Venom Anaphylaxis and Swelling   Wasp Venom Anaphylaxis and Swelling   Citrus Hives and Itching   Latex Hives and Swelling   Penicillins Hives and Itching    Has patient had a PCN reaction causing immediate rash, facial/tongue/throat swelling, SOB or lightheadedness with hypotension: Yes Has patient had a PCN reaction causing severe rash involving mucus membranes or skin necrosis: Unk Has patient had a PCN reaction that required hospitalization: Was already in hosp Has patient had a PCN reaction occurring within the last 10 years: No If all of the above answers are "NO", then may proceed with Cephalosporin use.    Shellfish Allergy Other (See Comments) and Swelling    Patient is uncertain; stated that she tolerates this (??)   Soap Hives, Itching and Swelling    PUREX LAUNDRY DETERGENT   Tomato Hives and Itching   Banana Itching and Nausea Only   Chocolate Itching   Fluticasone Other (See Comments)    Per patient, makes sneeze and gag   Hydrocodone Itching and Nausea Only   Other Itching    Acidic foods- Itching [place]- Itching in the roof of the mouth   Adhesive [Tape] Itching and Rash    Allergic to  "leads"   Doxycycline Other (See Comments)    AFFECTS JOINTS   Liraglutide Nausea Only   Metformin And Related Nausea Only, Rash, Other (See Comments) and Diarrhea    GI UPSET, also   Red Dye Itching, Rash and Other (See Comments)    At eye doctor's office, the dilating drops- Skin breaks out, chest turned red,  itching, face became swollen   PAST MEDICAL HISTORY 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   Cataract    NS OU   Complication of anesthesia    states is hard to wake up post-op   Constipation 12/30/2014   Diabetes mellitus    intolerant to Metformin   Diabetic retinopathy (HCC)    NPDR OU   Dry skin    hands    Family history of anesthesia complication    mother went into coma during c-section   Genital herpes 01/01/2006   on acyclovir BID    GERD (gastroesophageal reflux disease)  Herpes genital    Hyperglycemia 12/26/2017   Hypertensive retinopathy    OU   Low back pain 11/22/2012   Lower extremity edema 10/18/2012   Menorrhagia 11/06/2015   Morbid obesity with BMI of 50 to 60 07/23/2009   Obesity hypoventilation syndrome (HCC) 10/30/2012   Obesity, morbid (HCC) 01/10/2014   OSA on CPAP     AHI was on  11-17-12 to 120/hr, titrated to 15 cm with 3 cm EPR/    PCOS (polycystic ovarian syndrome)    Rheumatoid arthritis (HCC)    Right ankle pain 06/19/2015   Right hip pain 09/04/2017   Right shoulder pain 06/01/2017   Seasonal allergies    Sleep apnea, obstructive    uses CPAP nightly - had sleep study 08/22/2007   Past Surgical History:  Procedure Laterality Date   CARPAL TUNNEL RELEASE Right 10/02/2012   Procedure: RIGHT CARPAL TUNNEL RELEASE ENDOSCOPIC;  Surgeon: Jodi Marble, MD;  Location: [facility];  Service: Orthopedics;  Laterality: Right;   CHOLECYSTECTOMY N/A 12/02/2014   Procedure: LAPAROSCOPIC CHOLECYSTECTOMY;  Surgeon: Darnell Level, MD;  Location: [facility];  Service: General;  Laterality: N/A;   HYSTEROSCOPY WITH D & C  01-08-2002   FAMILY HISTORY Family History  Problem Relation Age of Onset   Other  Mother    Diabetes Maternal Aunt    Heart attack Maternal Aunt    Alzheimer's disease Maternal Uncle    Cancer Maternal Uncle        prostate   Diabetes Paternal Aunt    Cancer Paternal Aunt        brain,mouth   Alzheimer's disease Paternal Aunt    Colon polyps Paternal Uncle    Diabetes Paternal Uncle    Diabetes Maternal Grandmother    Diabetes Maternal Grandfather    Leukemia Cousin    Other Other    Obesity Other    Other Other    Colitis Neg Hx    SOCIAL HISTORY Social History   Tobacco Use   Smoking status: Former    Years: 10    Types: Cigarettes    Quit date: 05/24/2010    Years since quitting: 12.1   Smokeless tobacco: Never  Vaping Use   Vaping Use: Never used  Substance Use Topics   Alcohol use: Yes    Alcohol/week: 0.0 standard drinks of alcohol    Comment: socially   Drug use: No       OPHTHALMIC EXAM: Base Eye Exam     Visual Acuity (Snellen - Linear)       Right Left   Dist cc 20/50 -1 20/40 -2   Dist ph cc 20/20 -2 20/30 -2         Tonometry (Tonopen, 1:10 PM)       Right Left   Pressure 11 18         Pupils       Pupils Dark Light Shape APD   Right PERRL 5 5 Round dilated   Left PERRL 5 5 Round dilated         Visual Fields       Left Right    Full Full         Extraocular Movement       Right Left    Full, Ortho Full, Ortho         Neuro/Psych     Oriented x3: Yes   Mood/Affect: Normal         Dilation  Both eyes: 2.5% Phenylephrine @ 1:10 PM           Slit Lamp and Fundus Exam     Slit Lamp Exam       Right Left   Lids/Lashes Dermatochalasis - upper lid, mild Meibomian gland dysfunction Dermatochalasis - upper lid, mild Meibomian gland dysfunction   Conjunctiva/Sclera White and quiet White and quiet   Cornea 2+Punctate epithelial erosions, mild tear film debris 3+fine Punctate epithelial erosions   Anterior Chamber deep and quiet deep and quiet   Iris round and dilated, No NVI round and  dilated   Lens 1+ NS, 1+CC 1+NS, 1+CC   Anterior Vitreous mild vitreous syneresis; PVD; mild vit condensations mild vitreous syneresis, scattered vitreous condensations, silicone oil micro bubbles trapped within vit         Fundus Exam       Right Left   Disc Pink and sharp, +cupping Pink and sharp, +cupping   C/D Ratio 0.7 0.6   Macula good foveal reflex, focal exudates ST macula, scattered MA, focal cystic changes ST macula - slightly improved, focal laser changes inferior macula good foveal reflex, scatttered MA's and exudate, central cystic changes -- persistent, focal cluster of exudates SN macula -- persistent   Vessels attenuated, Tortuous attenuated, Tortuous   Periphery Attached, scattered MA/exudates greatest inferiorly, faint CWS nasal midzone Attached, scattered MA and exudate greatest superior to disc, white without pressure temporally           Refraction     Wearing Rx       Sphere Cylinder Axis   Right -2.50 +1.50 013   Left -2.25 Sphere     Type: SVL           IMAGING AND PROCEDURES  Imaging and Procedures for @TODAY @  OCT, Retina - OU - Both Eyes       Right Eye Quality was good. Central Foveal Thickness: 219. Progression has improved. Findings include normal foveal contour, no SRF, intraretinal hyper-reflective material, intraretinal fluid, vitreomacular adhesion (Mild interval improvement in IRF/cystic changes temporal fovea and macula ).   Left Eye Quality was good. Central Foveal Thickness: 342. Progression has worsened. Findings include no SRF, abnormal foveal contour, intraretinal hyper-reflective material, intraretinal fluid (Persistent IRF SN fovea and mac -- slightly increased).   Notes *Images captured and stored on drive  Diagnosis / Impression:  +DME OU OD: Mild interval improvement in IRF/cystic changes temporal fovea and macula  OS: Persistent IRF SN fovea and mac -- slightly increased  Clinical management:  See  below  Abbreviations: NFP - Normal foveal profile. CME - cystoid macular edema. PED - pigment epithelial detachment. IRF - intraretinal fluid. SRF - subretinal fluid. EZ - ellipsoid zone. ERM - epiretinal membrane. ORA - outer retinal atrophy. ORT - outer retinal tubulation. SRHM - subretinal hyper-reflective material      Intravitreal Injection, Pharmacologic Agent - OS - Left Eye       Time Out 07/20/2022. 1:26 PM. Confirmed correct patient, procedure, site, and patient consented.   Anesthesia Topical anesthesia was used. Anesthetic medications included Lidocaine 2%, Proparacaine 0.5%.   Procedure Preparation included 5% betadine to ocular surface, eyelid speculum. A (32g) needle was used.   Injection: 1.25 mg Bevacizumab 1.25mg /0.84ml   Route: Intravitreal, Site: Left Eye   NDC: [id number], Lot: [id number], Expiration date: 10/23/2022   Post-op Post injection exam found visual acuity of at least counting fingers. The patient tolerated the procedure well. There were no complications. The patient received  written and verbal post procedure care education. Post injection medications were not given.            ASSESSMENT/PLAN:    ICD-10-CM   1. Moderate nonproliferative diabetic retinopathy of both eyes with macular edema associated with type 2 diabetes mellitus (HCC)  E11.3313 OCT, Retina - OU - Both Eyes    Intravitreal Injection, Pharmacologic Agent - OS - Left Eye    Bevacizumab (AVASTIN) SOLN 1.25 mg    2. Current use of insulin (HCC)  Z79.4     3. Long term (current) use of oral hypoglycemic drugs  Z79.84     4. Essential hypertension  I10     5. Hypertensive retinopathy of both eyes  H35.033     6. Nuclear sclerosis of both eyes  H25.13     7. Dry eyes  H04.123      1-3.  Moderate non-proliferative diabetic retinopathy w/ DME, OU  - most recent A1c 9.0% on 05.01.24  - pt lost to f/u from 10.23.2019 to 11.18.20  - FA in 2019 showed leaking MA OU; no NV OU **of  note, pt had pruritic rxn to fluorescein dye -- resolved with 12.5 mg IV benadryl**  - FA 12.16.2020 shows late leaking MA's, non-perfusion defects; no NV OU - s/p IVA OD #1 (12.16.20), #2 (01.25.21), #3 (05.26.21), #4 (07.02.21), #5 (10.15.21), #6 (11.23.21), #7 (07.27.22), #8 (09.09.22), #9 (11.11.22), #10 (01.20.23), #11 (02.23.23), #12 (04.09.23), #13 (05.23.23), #14 (06.30.23), #15 (08.04.23), #16 (09.01.23), #17 (10.06.23), #18 (11.10.23) #19 (12.15.23), #20 (01.19.24), #21 (02.22.24), #22 (04.09.24)  - s/p focal laser OD (03.19.21), (04.01.22) - s/p IVA OS #1 (12.23.20), #2 (01.27.21), #3 (03.02.21), #4 (05.26.21), #5 (07.02.21), #6 (10.15.21), #7 (11.23.21), #8 (01.11.22), #9 (03.04.22), #10 (04.27.22), #11 (11.14.22), #12 (01.10.23), #13 (02.20.23), #14 (04.05.23), #15 (05.19.23), #16 (06.23.23), #17 (07.28.23), #18 (09.01.23), #19 (10.06.23), #20 (11.10.23) #21 (12.15.23), #22 (01.19.24), #23 (02.22.24), #24 (04.01.24), #25 (04.30.24)  - exam shows scattered MA, IRH, exudates and mild macular edema OU  - BCVA 20/20 OD -- improved, 20/30 OS -- decreased - OCT shows OD: Mild interval improvement in IRF/cystic changes temporal fovea and macula  OS: Persistent IRF SN fovea and mac -- slightly increased at 4 wks - Recommend IVA OS #26 today 05.28.24 w/ f/u in 4 wks - will hold tx in OD again  - pt wishes to proceed with injection OS - RBA of procedure discussed, questions answered - see procedure note  - Avastin informed consent form re-signed and scanned on 05.19.2023  - f/u 4 weeks, DFE, OCT, possible injection(s)  4,5. Hypertensive retinopathy OU  - discussed importance of tight BP control             - continue to monitor   6. Nuclear Sclerosis  - The symptoms of cataract, surgical options, and treatments and risks were discussed with patient.   - discussed diagnosis and progression  - under the expert management of [facility]   7. Dry eyes OU  - likely cause of decreased  vision OU today - recommend artificial tears and lubricating ointment as needed  Ophthalmic Meds Ordered this visit:  Meds ordered this encounter  Medications   Bevacizumab (AVASTIN) SOLN 1.25 mg    This document serves as a record of services personally performed by Karie Chimera, MD, PhD. It was created on their behalf by Gerilyn Nestle, COT an ophthalmic technician. The creation of this record is the provider's dictation and/or activities during the visit.  Electronically signed by:  Gerilyn Nestle, COT  05.20.24 5:27 PM  This document serves as a record of services personally performed by Karie Chimera, MD, PhD. It was created on their behalf by Glee Arvin. Manson Passey, OA an ophthalmic technician. The creation of this record is the provider's dictation and/or activities during the visit.    Electronically signed by: Glee Arvin. Manson Passey, [place] 05.28.2024 5:27 PM  Karie Chimera, M.D., Ph.D. Diseases & Surgery of the Retina and Vitreous [facility]  I have reviewed the above documentation for accuracy and completeness, and I agree with the above. Karie Chimera, M.D., Ph.D. 07/22/22 5:30 PM  Abbreviations: M myopia (nearsighted); A astigmatism; H hyperopia (farsighted); P presbyopia; Mrx spectacle prescription;  CTL contact lenses; OD right eye; OS left eye; OU both eyes  XT exotropia; ET esotropia; PEK punctate epithelial keratitis; PEE punctate epithelial erosions; DES dry eye syndrome; MGD meibomian gland dysfunction; ATs artificial tears; PFAT's preservative free artificial tears; NSC nuclear sclerotic cataract; PSC posterior subcapsular cataract; ERM epi-retinal membrane; PVD posterior vitreous detachment; RD retinal detachment; DM diabetes mellitus; DR diabetic retinopathy; NPDR non-proliferative diabetic retinopathy; PDR proliferative diabetic retinopathy; CSME clinically significant macular edema; DME diabetic macular edema; dbh dot blot hemorrhages; CWS  cotton wool spot; POAG primary open angle glaucoma; C/D cup-to-disc ratio; HVF humphrey visual field; GVF goldmann visual field; OCT optical coherence tomography; IOP intraocular pressure; BRVO Branch retinal vein occlusion; CRVO central retinal vein occlusion; CRAO central retinal artery occlusion; BRAO branch retinal artery occlusion; RT retinal tear; SB scleral buckle; PPV pars plana vitrectomy; VH Vitreous hemorrhage; PRP panretinal laser photocoagulation; IVK intravitreal kenalog; VMT vitreomacular traction; MH Macular hole;  NVD neovascularization of the disc; NVE neovascularization elsewhere; AREDS age related eye disease study; ARMD age related macular degeneration; POAG primary open angle glaucoma; EBMD epithelial/anterior basement membrane dystrophy; ACIOL anterior chamber intraocular lens; IOL intraocular lens; PCIOL posterior chamber intraocular lens; Phaco/IOL phacoemulsification with intraocular lens placement; PRK photorefractive keratectomy; LASIK laser assisted in situ keratomileusis; HTN hypertension; DM diabetes mellitus; COPD chronic obstructive pulmonary disease

## 2022-07-20 ENCOUNTER — Ambulatory Visit (INDEPENDENT_AMBULATORY_CARE_PROVIDER_SITE_OTHER): Payer: Medicaid Other | Admitting: Ophthalmology

## 2022-07-20 ENCOUNTER — Encounter (INDEPENDENT_AMBULATORY_CARE_PROVIDER_SITE_OTHER): Payer: Self-pay | Admitting: Ophthalmology

## 2022-07-20 DIAGNOSIS — Z7984 Long term (current) use of oral hypoglycemic drugs: Secondary | ICD-10-CM

## 2022-07-20 DIAGNOSIS — Z794 Long term (current) use of insulin: Secondary | ICD-10-CM | POA: Diagnosis not present

## 2022-07-20 DIAGNOSIS — I10 Essential (primary) hypertension: Secondary | ICD-10-CM | POA: Diagnosis not present

## 2022-07-20 DIAGNOSIS — E113313 Type 2 diabetes mellitus with moderate nonproliferative diabetic retinopathy with macular edema, bilateral: Secondary | ICD-10-CM

## 2022-07-20 DIAGNOSIS — H35033 Hypertensive retinopathy, bilateral: Secondary | ICD-10-CM

## 2022-07-20 DIAGNOSIS — H04123 Dry eye syndrome of bilateral lacrimal glands: Secondary | ICD-10-CM

## 2022-07-20 DIAGNOSIS — H2513 Age-related nuclear cataract, bilateral: Secondary | ICD-10-CM

## 2022-07-20 MED ORDER — BEVACIZUMAB CHEMO INJECTION 1.25MG/0.05ML SYRINGE FOR KALEIDOSCOPE
1.2500 mg | INTRAVITREAL | Status: AC | PRN
Start: 2022-07-20 — End: 2022-07-20
  Administered 2022-07-20: 1.25 mg via INTRAVITREAL

## 2022-08-09 ENCOUNTER — Ambulatory Visit: Payer: Medicaid Other | Admitting: Family Medicine

## 2022-08-17 ENCOUNTER — Ambulatory Visit: Payer: Medicaid Other | Admitting: Family Medicine

## 2022-08-20 ENCOUNTER — Encounter (INDEPENDENT_AMBULATORY_CARE_PROVIDER_SITE_OTHER): Payer: Medicaid Other | Admitting: Ophthalmology

## 2022-08-20 ENCOUNTER — Ambulatory Visit (INDEPENDENT_AMBULATORY_CARE_PROVIDER_SITE_OTHER): Payer: Medicaid Other | Admitting: Ophthalmology

## 2022-08-20 ENCOUNTER — Encounter (INDEPENDENT_AMBULATORY_CARE_PROVIDER_SITE_OTHER): Payer: Self-pay | Admitting: Ophthalmology

## 2022-08-20 DIAGNOSIS — H35033 Hypertensive retinopathy, bilateral: Secondary | ICD-10-CM

## 2022-08-20 DIAGNOSIS — I10 Essential (primary) hypertension: Secondary | ICD-10-CM

## 2022-08-20 DIAGNOSIS — Z7984 Long term (current) use of oral hypoglycemic drugs: Secondary | ICD-10-CM

## 2022-08-20 DIAGNOSIS — H2513 Age-related nuclear cataract, bilateral: Secondary | ICD-10-CM

## 2022-08-20 DIAGNOSIS — E113313 Type 2 diabetes mellitus with moderate nonproliferative diabetic retinopathy with macular edema, bilateral: Secondary | ICD-10-CM

## 2022-08-20 DIAGNOSIS — H04123 Dry eye syndrome of bilateral lacrimal glands: Secondary | ICD-10-CM

## 2022-08-20 DIAGNOSIS — Z794 Long term (current) use of insulin: Secondary | ICD-10-CM

## 2022-08-20 MED ORDER — BEVACIZUMAB CHEMO INJECTION 1.25MG/0.05ML SYRINGE FOR KALEIDOSCOPE
1.2500 mg | INTRAVITREAL | Status: AC | PRN
Start: 2022-08-20 — End: 2022-08-20
  Administered 2022-08-20: 1.25 mg via INTRAVITREAL

## 2022-08-20 NOTE — Progress Notes (Signed)
[facility] - Clinic Note  08/20/2022     CHIEF COMPLAINT Patient presents for Retina Follow Up  HISTORY OF PRESENT ILLNESS: Ana Long is a 47 y.o. female who presents to the clinic today for:   HPI     Retina Follow Up   Patient presents with  Diabetic Retinopathy.  This started 4 weeks ago.  Duration of 4 weeks.  Since onset it is stable.  I, the attending physician,  performed the HPI with the patient and updated documentation appropriately.        Comments   4 week retina follow up NPDR and IVA OU pt is reporting no vision changes noticed she denies any flashes buy has some floaters last reading 192 yesterday       Last edited by Rennis Chris, MD on 08/20/2022 12:33 PM.      Referring physician: Olivia Canter MD [address] [place], [place] [address]  HISTORICAL INFORMATION:   Selected notes from the MEDICAL RECORD NUMBER Re-referred by Dr. Fabian Sharp for DM exam   CURRENT MEDICATIONS: Current Outpatient Medications (Ophthalmic Drugs)  Medication Sig   Olopatadine HCl 0.2 % SOLN Place one drop into both eyes daily.   No current facility-administered medications for this visit. (Ophthalmic Drugs)   Current Outpatient Medications (Other)  Medication Sig   acyclovir (ZOVIRAX) 400 MG tablet TAKE 1 TABLET BY MOUTH TWICE A DAY   albuterol (PROAIR HFA) 108 (90 Base) MCG/ACT inhaler Inhale 2 puffs into the lungs every 4 (four) hours as needed for wheezing or shortness of breath (cough). (Patient taking differently: Inhale 2 puffs into the lungs every 4 (four) hours as needed for wheezing or shortness of breath (or coughing).)   atorvastatin (LIPITOR) 10 MG tablet Take 10 mg by mouth daily.   benzonatate (TESSALON) 200 MG capsule Take 1 capsule (200 mg total) by mouth 3 (three) times daily as needed for cough.   Blood Glucose Monitoring Suppl (ACCU-CHEK AVIVA PLUS) w/Device KIT Please check you blood sugars 4 times a day   CIPRODEX OTIC  suspension Place into the right ear.   cyclobenzaprine (FLEXERIL) 10 MG tablet Take 1 tablet (10 mg total) by mouth 3 (three) times daily as needed for muscle spasms.   DERMA-SMOOTHE/FS SCALP 0.01 % OIL Apply topically.   diclofenac (VOLTAREN) 75 MG EC tablet Take 1 tablet (75 mg total) by mouth 2 (two) times daily.   fluconazole (DIFLUCAN) 150 MG tablet TAKE 1 TABLET BY MOUTH NOW. THEN REPEAT IN 1 WEEK   furosemide (LASIX) 20 MG tablet Take 20 mg by mouth daily as needed for fluid or edema.   glucose blood (ACCU-CHEK SMARTVIEW) test strip TEST FOUR TIMES DAILY   HUMULIN R U-500 KWIKPEN 500 UNIT/ML kwikpen Inject into the skin.   hydrochlorothiazide (HYDRODIURIL) 12.5 MG tablet Take by mouth.   Insulin Pen Needle (B-D UF III MINI PEN NEEDLES) 31G X 5 MM MISC USE AS DIRECTED TO INJECT INSULIN FIVE A DAY E11.65   lactulose (CEPHULAC) 10 g packet Take 1 packet (10 g total) by mouth daily as needed. Reported on 07/29/2015 (Patient taking differently: Take 10 g by mouth daily as needed (for constipation).)   Lancets (ACCU-CHEK SOFT TOUCH) lancets Use as instructed   loratadine (CLARITIN) 10 MG tablet Take 1 tablet (10 mg total) by mouth 2 (two) times daily.   mometasone-formoterol (DULERA) 200-5 MCG/ACT AERO Inhale 2 puffs into the lungs 2 (two) times daily.   montelukast (  SINGULAIR) 10 MG tablet Take 1 tablet (10 mg total) by mouth at bedtime.   nitroGLYCERIN (NITRODUR - DOSED IN MG/24 HR) 0.2 mg/hr patch Apply 1/4th patch to affected achilles, change daily   omeprazole (PRILOSEC) 20 MG capsule Take 1 capsule (20 mg total) by mouth daily before breakfast.   SPIRIVA RESPIMAT 1.25 MCG/ACT AERS SMARTSIG:2 Puff(s) Via Inhaler Daily   sulfamethoxazole-trimethoprim (BACTRIM DS) 800-160 MG tablet Take 1 tablet by mouth 2 (two) times daily.   topiramate (TOPAMAX) 25 MG tablet Take by mouth.   Current Facility-Administered Medications (Other)  Medication Route   diphenhydrAMINE (BENADRYL) injection 12.5 mg  Intramuscular   REVIEW OF SYSTEMS: ROS   Positive for: Endocrine, Eyes, Respiratory Negative for: Constitutional, Gastrointestinal, Neurological, Skin, Genitourinary, Musculoskeletal, HENT, Cardiovascular, Psychiatric, Allergic/Imm, Heme/Lymph Last edited by Etheleen Mayhew, COT on 08/20/2022 10:27 AM.     ALLERGIES Allergies  Allergen Reactions   Bee Venom Anaphylaxis and Swelling   Wasp Venom Anaphylaxis and Swelling   Citrus Hives and Itching   Latex Hives and Swelling   Penicillins Hives and Itching    Has patient had a PCN reaction causing immediate rash, facial/tongue/throat swelling, SOB or lightheadedness with hypotension: Yes Has patient had a PCN reaction causing severe rash involving mucus membranes or skin necrosis: Unk Has patient had a PCN reaction that required hospitalization: Was already in hosp Has patient had a PCN reaction occurring within the last 10 years: No If all of the above answers are "NO", then may proceed with Cephalosporin use.    Shellfish Allergy Other (See Comments) and Swelling    Patient is uncertain; stated that she tolerates this (??)   Soap Hives, Itching and Swelling    PUREX LAUNDRY DETERGENT   Tomato Hives and Itching   Banana Itching and Nausea Only   Chocolate Itching   Fluticasone Other (See Comments)    Per patient, makes sneeze and gag   Hydrocodone Itching and Nausea Only   Other Itching    Acidic foods- Itching [place]- Itching in the roof of the mouth   Adhesive [Tape] Itching and Rash    Allergic to  "leads"   Doxycycline Other (See Comments)    AFFECTS JOINTS   Liraglutide Nausea Only   Metformin And Related Nausea Only, Rash, Other (See Comments) and Diarrhea    GI UPSET, also   Red Dye Itching, Rash and Other (See Comments)    At eye doctor's office, the dilating drops- Skin breaks out, chest turned red, itching, face became swollen   PAST MEDICAL HISTORY Past Medical History:  Diagnosis Date   Acute  nonintractable headache 12/26/2017   Arthritis    major joints   Asthma    prn inhaler   Asthma exacerbation 07/25/2013   Asthma in adult, severe persistent, with acute exacerbation 01/11/2018   Carpal tunnel syndrome of right wrist 09/2012   Cataract    NS OU   Complication of anesthesia    states is hard to wake up post-op   Constipation 12/30/2014   Diabetes mellitus    intolerant to Metformin   Diabetic retinopathy (HCC)    NPDR OU   Dry skin    hands    Family history of anesthesia complication    mother went into coma during c-section   Genital herpes 01/01/2006   on acyclovir BID    GERD (gastroesophageal reflux disease)    Herpes genital    Hyperglycemia 12/26/2017   Hypertensive retinopathy    OU  Low back pain 11/22/2012   Lower extremity edema 10/18/2012   Menorrhagia 11/06/2015   Morbid obesity with BMI of 50 to 60 07/23/2009   Obesity hypoventilation syndrome (HCC) 10/30/2012   Obesity, morbid (HCC) 01/10/2014   OSA on CPAP     AHI was on  11-17-12 to 120/hr, titrated to 15 cm with 3 cm EPR/    PCOS (polycystic ovarian syndrome)    Rheumatoid arthritis (HCC)    Right ankle pain 06/19/2015   Right hip pain 09/04/2017   Right shoulder pain 06/01/2017   Seasonal allergies    Sleep apnea, obstructive    uses CPAP nightly - had sleep study 08/22/2007   Past Surgical History:  Procedure Laterality Date   CARPAL TUNNEL RELEASE Right 10/02/2012   Procedure: RIGHT CARPAL TUNNEL RELEASE ENDOSCOPIC;  Surgeon: Jodi Marble, MD;  Location: [facility];  Service: Orthopedics;  Laterality: Right;   CHOLECYSTECTOMY N/A 12/02/2014   Procedure: LAPAROSCOPIC CHOLECYSTECTOMY;  Surgeon: Darnell Level, MD;  Location: [facility];  Service: General;  Laterality: N/A;   HYSTEROSCOPY WITH D & C  01-08-2002   FAMILY HISTORY Family History  Problem Relation Age of Onset   Other Mother    Diabetes Maternal Aunt    Heart attack Maternal Aunt    Alzheimer's disease Maternal Uncle     Cancer Maternal Uncle        prostate   Diabetes Paternal Aunt    Cancer Paternal Aunt        brain,mouth   Alzheimer's disease Paternal Aunt    Colon polyps Paternal Uncle    Diabetes Paternal Uncle    Diabetes Maternal Grandmother    Diabetes Maternal Grandfather    Leukemia Cousin    Other Other    Obesity Other    Other Other    Colitis Neg Hx    SOCIAL HISTORY Social History   Tobacco Use   Smoking status: Former    Years: 10    Types: Cigarettes    Quit date: 05/24/2010    Years since quitting: 12.2   Smokeless tobacco: Never  Vaping Use   Vaping Use: Never used  Substance Use Topics   Alcohol use: Yes    Alcohol/week: 0.0 standard drinks of alcohol    Comment: socially   Drug use: No       OPHTHALMIC EXAM: Base Eye Exam     Visual Acuity (Snellen - Linear)       Right Left   Dist cc 20/40 20/50 -2   Dist ph cc 20/25 -1 20/30 -2    Correction: Glasses         Tonometry (Tonopen, 10:34 AM)       Right Left   Pressure 15 16         Pupils       Pupils Dark Light Shape React APD   Right PERRL 3 2 Round Brisk None   Left PERRL 5 4 Round Sluggish None         Visual Fields       Left Right    Full Full         Extraocular Movement       Right Left    Full, Ortho Full, Ortho         Neuro/Psych     Oriented x3: Yes   Mood/Affect: Normal         Dilation     Both eyes: 2.5% Phenylephrine @ 10:34 AM  Slit Lamp and Fundus Exam     Slit Lamp Exam       Right Left   Lids/Lashes Dermatochalasis - upper lid, mild Meibomian gland dysfunction Dermatochalasis - upper lid, mild Meibomian gland dysfunction   Conjunctiva/Sclera White and quiet White and quiet   Cornea 2+Punctate epithelial erosions, mild tear film debris 3+fine Punctate epithelial erosions   Anterior Chamber deep and quiet deep and quiet   Iris round and dilated, No NVI round and dilated   Lens 1+ NS, 1+CC 1+NS, 1+CC   Anterior Vitreous mild  vitreous syneresis; PVD; mild vit condensations mild vitreous syneresis, scattered vitreous condensations, silicone oil micro bubbles trapped within vit         Fundus Exam       Right Left   Disc Pink and sharp, +cupping Pink and sharp, +cupping   C/D Ratio 0.7 0.6   Macula good foveal reflex, focal exudates ST macula, scattered MA, focal cystic changes ST macula - slightly increased, focal laser changes inferior macula good foveal reflex, scatttered MA's and exudate, central cystic changes -- persistent, focal cluster of exudates SN macula -- persistent   Vessels attenuated, Tortuous attenuated, Tortuous   Periphery Attached, scattered MA/exudates greatest inferiorly, faint CWS nasal midzone Attached, scattered MA and exudate greatest superior to disc, white without pressure temporally           Refraction     Wearing Rx       Sphere Cylinder Axis   Right -2.50 +1.50 013   Left -2.25 Sphere     Type: SVL           IMAGING AND PROCEDURES  Imaging and Procedures for @TODAY @  OCT, Retina - OU - Both Eyes       Right Eye Quality was good. Central Foveal Thickness: 221. Progression has worsened. Findings include normal foveal contour, no SRF, intraretinal hyper-reflective material, intraretinal fluid, vitreomacular adhesion (Mild interval increase in IRF/cystic changes temporal fovea and macula ).   Left Eye Quality was good. Central Foveal Thickness: 345. Progression has worsened. Findings include no SRF, abnormal foveal contour, intraretinal hyper-reflective material, intraretinal fluid (Persistent IRF SN fovea and mac -- slightly increased).   Notes *Images captured and stored on drive  Diagnosis / Impression:  +DME OU OD: Mild interval increase in IRF/cystic changes temporal fovea and macula  OS: Persistent IRF SN fovea and mac -- slightly increased  Clinical management:  See below  Abbreviations: NFP - Normal foveal profile. CME - cystoid macular edema. PED  - pigment epithelial detachment. IRF - intraretinal fluid. SRF - subretinal fluid. EZ - ellipsoid zone. ERM - epiretinal membrane. ORA - outer retinal atrophy. ORT - outer retinal tubulation. SRHM - subretinal hyper-reflective material      Intravitreal Injection, Pharmacologic Agent - OD - Right Eye       Time Out 08/20/2022. 11:16 AM. Confirmed correct patient, procedure, site, and patient consented.   Anesthesia Topical anesthesia was used. Anesthetic medications included Lidocaine 2%, Proparacaine 0.5%.   Procedure Preparation included 5% betadine to ocular surface, eyelid speculum. A supplied (32g) needle was used.   Injection: 1.25 mg Bevacizumab 1.25mg /0.107ml   Route: Intravitreal, Site: Right Eye   NDC: P3213405, Lot: [id number], Expiration date: 10/04/2022   Post-op Post injection exam found visual acuity of at least counting fingers. The patient tolerated the procedure well. There were no complications. The patient received written and verbal post procedure care education. Post injection medications were not given.  Intravitreal Injection, Pharmacologic Agent - OS - Left Eye       Time Out 08/20/2022. 11:16 AM. Confirmed correct patient, procedure, site, and patient consented.   Anesthesia Topical anesthesia was used. Anesthetic medications included Lidocaine 2%, Proparacaine 0.5%.   Procedure Preparation included 5% betadine to ocular surface, eyelid speculum. A (32g) needle was used.   Injection: 1.25 mg Bevacizumab 1.25mg /0.49ml   Route: Intravitreal, Site: Left Eye   NDC: P3213405, Lot: [id number], Expiration date: 11/19/2022   Post-op Post injection exam found visual acuity of at least counting fingers. The patient tolerated the procedure well. There were no complications. The patient received written and verbal post procedure care education. Post injection medications were not given.            ASSESSMENT/PLAN:    ICD-10-CM   1. Moderate  nonproliferative diabetic retinopathy of both eyes with macular edema associated with type 2 diabetes mellitus (HCC)  E11.3313 OCT, Retina - OU - Both Eyes    Intravitreal Injection, Pharmacologic Agent - OD - Right Eye    Intravitreal Injection, Pharmacologic Agent - OS - Left Eye    Bevacizumab (AVASTIN) SOLN 1.25 mg    Bevacizumab (AVASTIN) SOLN 1.25 mg    2. Current use of insulin (HCC)  Z79.4     3. Long term (current) use of oral hypoglycemic drugs  Z79.84     4. Essential hypertension  I10     5. Hypertensive retinopathy of both eyes  H35.033     6. Nuclear sclerosis of both eyes  H25.13     7. Dry eyes  H04.123      1-3.  Moderate non-proliferative diabetic retinopathy w/ DME, OU  - most recent A1c 9.0% on 05.01.24  - pt lost to f/u from 10.23.2019 to 11.18.20  - FA in 2019 showed leaking MA OU; no NV OU **of note, pt had pruritic rxn to fluorescein dye -- resolved with 12.5 mg IV benadryl**  - FA 12.16.2020 shows late leaking MA's, non-perfusion defects; no NV OU - s/p IVA OD #1 (12.16.20), #2 (01.25.21), #3 (05.26.21), #4 (07.02.21), #5 (10.15.21), #6 (11.23.21), #7 (07.27.22), #8 (09.09.22), #9 (11.11.22), #10 (01.20.23), #11 (02.23.23), #12 (04.09.23), #13 (05.23.23), #14 (06.30.23), #15 (08.04.23), #16 (09.01.23), #17 (10.06.23), #18 (11.10.23) #19 (12.15.23), #20 (01.19.24), #21 (02.22.24), #22 (04.09.24)  - s/p focal laser OD (03.19.21), (04.01.22) - s/p IVA OS #1 (12.23.20), #2 (01.27.21), #3 (03.02.21), #4 (05.26.21), #5 (07.02.21), #6 (10.15.21), #7 (11.23.21), #8 (01.11.22), #9 (03.04.22), #10 (04.27.22), #11 (11.14.22), #12 (01.10.23), #13 (02.20.23), #14 (04.05.23), #15 (05.19.23), #16 (06.23.23), #17 (07.28.23), #18 (09.01.23), #19 (10.06.23), #20 (11.10.23) #21 (12.15.23), #22 (01.19.24), #23 (02.22.24), #24 (04.01.24), #25 (04.30.24), #26 (05.28.24)  - exam shows scattered MA, IRH, exudates and mild macular edema OU  - BCVA 20/25 OD -- decreased, 20/30 OS --  stable - OCT shows OD: Mild interval increase in IRF/cystic changes temporal fovea and macula at 11 weeks; OS: Persistent IRF SN fovea and mac -- slightly increased at 4 wks **discussed decreased efficacy / resistance to Avastin and potential benefit of switching medication** - Recommend IVA OD #23 and IVA OS #27 today 06.28.24 w/ f/u in 4 wks  - pt wishes to proceed with injections - RBA of procedure discussed, questions answered - see procedure note  - Avastin informed consent form re-signed and scanned on 05.19.2023  - f/u 4 weeks, DFE, OCT, possible injection(s)  4,5. Hypertensive retinopathy OU  - discussed importance of tight BP control             -  continue to monitor   6. Nuclear Sclerosis  - The symptoms of cataract, surgical options, and treatments and risks were discussed with patient.   - discussed diagnosis and progression  - under the expert management of [facility]   7. Dry eyes OU  - likely cause of decreased vision OU today - recommend artificial tears and lubricating ointment as needed  Ophthalmic Meds Ordered this visit:  Meds ordered this encounter  Medications   Bevacizumab (AVASTIN) SOLN 1.25 mg   Bevacizumab (AVASTIN) SOLN 1.25 mg    This document serves as a record of services personally performed by Karie Chimera, MD, PhD. It was created on their behalf by Glee Arvin. Manson Passey, OA an ophthalmic technician. The creation of this record is the provider's dictation and/or activities during the visit.    Electronically signed by: Glee Arvin. Manson Passey, OA 08/22/22 1:04 PM   Karie Chimera, M.D., Ph.D. Diseases & Surgery of the Retina and Vitreous [facility]  I have reviewed the above documentation for accuracy and completeness, and I agree with the above. Karie Chimera, M.D., Ph.D. 08/22/22 1:06 PM   Abbreviations: M myopia (nearsighted); A astigmatism; H hyperopia (farsighted); P presbyopia; Mrx spectacle prescription;  CTL contact  lenses; OD right eye; OS left eye; OU both eyes  XT exotropia; ET esotropia; PEK punctate epithelial keratitis; PEE punctate epithelial erosions; DES dry eye syndrome; MGD meibomian gland dysfunction; ATs artificial tears; PFAT's preservative free artificial tears; NSC nuclear sclerotic cataract; PSC posterior subcapsular cataract; ERM epi-retinal membrane; PVD posterior vitreous detachment; RD retinal detachment; DM diabetes mellitus; DR diabetic retinopathy; NPDR non-proliferative diabetic retinopathy; PDR proliferative diabetic retinopathy; CSME clinically significant macular edema; DME diabetic macular edema; dbh dot blot hemorrhages; CWS cotton wool spot; POAG primary open angle glaucoma; C/D cup-to-disc ratio; HVF humphrey visual field; GVF goldmann visual field; OCT optical coherence tomography; IOP intraocular pressure; BRVO Branch retinal vein occlusion; CRVO central retinal vein occlusion; CRAO central retinal artery occlusion; BRAO branch retinal artery occlusion; RT retinal tear; SB scleral buckle; PPV pars plana vitrectomy; VH Vitreous hemorrhage; PRP panretinal laser photocoagulation; IVK intravitreal kenalog; VMT vitreomacular traction; MH Macular hole;  NVD neovascularization of the disc; NVE neovascularization elsewhere; AREDS age related eye disease study; ARMD age related macular degeneration; POAG primary open angle glaucoma; EBMD epithelial/anterior basement membrane dystrophy; ACIOL anterior chamber intraocular lens; IOL intraocular lens; PCIOL posterior chamber intraocular lens; Phaco/IOL phacoemulsification with intraocular lens placement; PRK photorefractive keratectomy; LASIK laser assisted in situ keratomileusis; HTN hypertension; DM diabetes mellitus; COPD chronic obstructive pulmonary disease

## 2022-09-06 ENCOUNTER — Ambulatory Visit: Payer: Medicaid Other | Admitting: Family Medicine

## 2022-09-14 NOTE — Progress Notes (Signed)
[facility] - Clinic Note  09/20/2022     CHIEF COMPLAINT Patient presents for Retina Follow Up  HISTORY OF PRESENT ILLNESS: Ana Long is a 47 y.o. female who presents to the clinic today for:   HPI     Retina Follow Up   Patient presents with  Diabetic Retinopathy.  In both eyes.  This started 4 weeks ago.  Duration of 4 weeks.  Since onset it is stable.  I, the attending physician,  performed the HPI with the patient and updated documentation appropriately.        Comments   4 week retina follow up NPDR OU and IVA OU pt is reporting no vision changes noticed she has floaters but denies any flashes her last reading was 142 yesterday       Last edited by Rennis Chris, MD on 09/20/2022  5:07 PM.    Patient states there has been no changes in her vision. She has recently gotten new glasses.    Referring physician: Olivia Canter MD [address] [place], [place] [address]  HISTORICAL INFORMATION:   Selected notes from the MEDICAL RECORD NUMBER Re-referred by Dr. Fabian Sharp for DM exam   CURRENT MEDICATIONS: Current Outpatient Medications (Ophthalmic Drugs)  Medication Sig   Olopatadine HCl 0.2 % SOLN Place one drop into both eyes daily.   No current facility-administered medications for this visit. (Ophthalmic Drugs)   Current Outpatient Medications (Other)  Medication Sig   acyclovir (ZOVIRAX) 400 MG tablet TAKE 1 TABLET BY MOUTH TWICE A DAY   albuterol (PROAIR HFA) 108 (90 Base) MCG/ACT inhaler Inhale 2 puffs into the lungs every 4 (four) hours as needed for wheezing or shortness of breath (cough). (Patient taking differently: Inhale 2 puffs into the lungs every 4 (four) hours as needed for wheezing or shortness of breath (or coughing).)   atorvastatin (LIPITOR) 10 MG tablet Take 10 mg by mouth daily.   benzonatate (TESSALON) 200 MG capsule Take 1 capsule (200 mg total) by mouth 3 (three) times daily as needed for cough.   Blood  Glucose Monitoring Suppl (ACCU-CHEK AVIVA PLUS) w/Device KIT Please check you blood sugars 4 times a day   CIPRODEX OTIC suspension Place into the right ear.   cyclobenzaprine (FLEXERIL) 10 MG tablet Take 1 tablet (10 mg total) by mouth 3 (three) times daily as needed for muscle spasms.   DERMA-SMOOTHE/FS SCALP 0.01 % OIL Apply topically.   diclofenac (VOLTAREN) 75 MG EC tablet Take 1 tablet (75 mg total) by mouth 2 (two) times daily.   fluconazole (DIFLUCAN) 150 MG tablet TAKE 1 TABLET BY MOUTH NOW. THEN REPEAT IN 1 WEEK   furosemide (LASIX) 20 MG tablet Take 20 mg by mouth daily as needed for fluid or edema.   glucose blood (ACCU-CHEK SMARTVIEW) test strip TEST FOUR TIMES DAILY   HUMULIN R U-500 KWIKPEN 500 UNIT/ML kwikpen Inject into the skin.   hydrochlorothiazide (HYDRODIURIL) 12.5 MG tablet Take by mouth.   Insulin Pen Needle (B-D UF III MINI PEN NEEDLES) 31G X 5 MM MISC USE AS DIRECTED TO INJECT INSULIN FIVE A DAY E11.65   lactulose (CEPHULAC) 10 g packet Take 1 packet (10 g total) by mouth daily as needed. Reported on 07/29/2015 (Patient taking differently: Take 10 g by mouth daily as needed (for constipation).)   Lancets (ACCU-CHEK SOFT TOUCH) lancets Use as instructed   loratadine (CLARITIN) 10 MG tablet Take 1 tablet (10 mg total) by mouth  2 (two) times daily.   mometasone-formoterol (DULERA) 200-5 MCG/ACT AERO Inhale 2 puffs into the lungs 2 (two) times daily.   montelukast (SINGULAIR) 10 MG tablet Take 1 tablet (10 mg total) by mouth at bedtime.   nitroGLYCERIN (NITRODUR - DOSED IN MG/24 HR) 0.2 mg/hr patch Apply 1/4th patch to affected achilles, change daily   omeprazole (PRILOSEC) 20 MG capsule Take 1 capsule (20 mg total) by mouth daily before breakfast.   SPIRIVA RESPIMAT 1.25 MCG/ACT AERS SMARTSIG:2 Puff(s) Via Inhaler Daily   sulfamethoxazole-trimethoprim (BACTRIM DS) 800-160 MG tablet Take 1 tablet by mouth 2 (two) times daily.   topiramate (TOPAMAX) 25 MG tablet Take by mouth.    Current Facility-Administered Medications (Other)  Medication Route   diphenhydrAMINE (BENADRYL) injection 12.5 mg Intramuscular   REVIEW OF SYSTEMS: ROS   Positive for: Endocrine, Eyes, Respiratory Negative for: Constitutional, Gastrointestinal, Neurological, Skin, Genitourinary, Musculoskeletal, HENT, Cardiovascular, Psychiatric, Allergic/Imm, Heme/Lymph Last edited by Etheleen Mayhew, COT on 09/20/2022  2:00 PM.      ALLERGIES Allergies  Allergen Reactions   Bee Venom Anaphylaxis and Swelling   Wasp Venom Anaphylaxis and Swelling   Citrus Hives and Itching   Latex Hives and Swelling   Penicillins Hives and Itching    Has patient had a PCN reaction causing immediate rash, facial/tongue/throat swelling, SOB or lightheadedness with hypotension: Yes Has patient had a PCN reaction causing severe rash involving mucus membranes or skin necrosis: Unk Has patient had a PCN reaction that required hospitalization: Was already in hosp Has patient had a PCN reaction occurring within the last 10 years: No If all of the above answers are "NO", then may proceed with Cephalosporin use.    Shellfish Allergy Other (See Comments) and Swelling    Patient is uncertain; stated that she tolerates this (??)   Soap Hives, Itching and Swelling    PUREX LAUNDRY DETERGENT   Tomato Hives and Itching   Banana Itching and Nausea Only   Chocolate Itching   Fluticasone Other (See Comments)    Per patient, makes sneeze and gag   Hydrocodone Itching and Nausea Only   Other Itching    Acidic foods- Itching [place]- Itching in the roof of the mouth   Adhesive [Tape] Itching and Rash    Allergic to  "leads"   Doxycycline Other (See Comments)    AFFECTS JOINTS   Liraglutide Nausea Only   Metformin And Related Nausea Only, Rash, Other (See Comments) and Diarrhea    GI UPSET, also   Red Dye Itching, Rash and Other (See Comments)    At eye doctor's office, the dilating drops- Skin breaks out, chest  turned red, itching, face became swollen   PAST MEDICAL HISTORY 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   Cataract    NS OU   Complication of anesthesia    states is hard to wake up post-op   Constipation 12/30/2014   Diabetes mellitus    intolerant to Metformin   Diabetic retinopathy (HCC)    NPDR OU   Dry skin    hands    Family history of anesthesia complication    mother went into coma during c-section   Genital herpes 01/01/2006   on acyclovir  BID    GERD (gastroesophageal reflux disease)    Herpes genital    Hyperglycemia 12/26/2017   Hypertensive retinopathy    OU   Low back pain 11/22/2012   Lower extremity edema 10/18/2012   Menorrhagia 11/06/2015   Morbid obesity with BMI of 50 to 60 07/23/2009   Obesity hypoventilation syndrome (HCC) 10/30/2012   Obesity, morbid (HCC) 01/10/2014   OSA on CPAP     AHI was on  11-17-12 to 120/hr, titrated to 15 cm with 3 cm EPR/    PCOS (polycystic ovarian syndrome)    Rheumatoid arthritis (HCC)    Right ankle pain 06/19/2015   Right hip pain 09/04/2017   Right shoulder pain 06/01/2017   Seasonal allergies    Sleep apnea, obstructive    uses CPAP nightly - had sleep study 08/22/2007   Past Surgical History:  Procedure Laterality Date   CARPAL TUNNEL RELEASE Right 10/02/2012   Procedure: RIGHT CARPAL TUNNEL RELEASE ENDOSCOPIC;  Surgeon: Jodi Marble, MD;  Location: [facility];  Service: Orthopedics;  Laterality: Right;   CHOLECYSTECTOMY N/A 12/02/2014   Procedure: LAPAROSCOPIC CHOLECYSTECTOMY;  Surgeon: Darnell Level, MD;  Location: [facility];  Service: General;  Laterality: N/A;   HYSTEROSCOPY WITH D & C  01-08-2002   FAMILY HISTORY Family History  Problem Relation Age of Onset    Other Mother    Diabetes Maternal Aunt    Heart attack Maternal Aunt    Alzheimer's disease Maternal Uncle    Cancer Maternal Uncle        prostate   Diabetes Paternal Aunt    Cancer Paternal Aunt        brain,mouth   Alzheimer's disease Paternal Aunt    Colon polyps Paternal Uncle    Diabetes Paternal Uncle    Diabetes Maternal Grandmother    Diabetes Maternal Grandfather    Leukemia Cousin    Other Other    Obesity Other    Other Other    Colitis Neg Hx    SOCIAL HISTORY Social History   Tobacco Use   Smoking status: Former    Current packs/day: 0.00    Types: Cigarettes    Start date: 05/23/2000    Quit date: 05/24/2010    Years since quitting: 12.3   Smokeless tobacco: Never  Vaping Use   Vaping status: Never Used  Substance Use Topics   Alcohol use: Yes    Alcohol/week: 0.0 standard drinks of alcohol    Comment: socially   Drug use: No       OPHTHALMIC EXAM: Base Eye Exam     Visual Acuity (Snellen - Linear)       Right Left   Dist cc 20/25 -2 20/25 -2   Dist ph cc NI NI    Correction: Glasses         Tonometry (Tonopen, 2:12 PM)       Right Left   Pressure 12 16         Pupils       Pupils Dark Light Shape React APD   Right PERRL 3 2 Round Brisk None   Left PERRL 4 3 Round Sluggish None         Visual Fields       Left Right    Full Full         Extraocular Movement       Right Left    Full, Ortho Full, Ortho  Neuro/Psych     Oriented x3: Yes   Mood/Affect: Normal         Dilation     Both eyes: 2.5% Phenylephrine @ 2:16 PM           Slit Lamp and Fundus Exam     Slit Lamp Exam       Right Left   Lids/Lashes Dermatochalasis - upper lid, mild Meibomian gland dysfunction Dermatochalasis - upper lid, mild Meibomian gland dysfunction   Conjunctiva/Sclera White and quiet White and quiet   Cornea 2+Punctate epithelial erosions, mild tear film debris 3+fine Punctate epithelial erosions   Anterior  Chamber deep and quiet deep and quiet   Iris round and dilated, No NVI round and dilated   Lens 1+ NS, 1+CC 1+NS, 1+CC   Anterior Vitreous mild vitreous syneresis; PVD; mild vit condensations mild vitreous syneresis, scattered vitreous condensations, silicone oil micro bubbles trapped within vit         Fundus Exam       Right Left   Disc Pink and sharp, +cupping Pink and sharp, +cupping   C/D Ratio 0.7 0.6   Macula good foveal reflex, focal exudates ST macula, scattered MA, focal cystic changes ST macula - improved, focal laser changes inferior macula good foveal reflex, scatttered MA's and exudate, central cystic changes -- persistent, focal cluster of exudates SN macula -- persistent   Vessels attenuated, Tortuous attenuated, Tortuous   Periphery Attached, scattered MA/exudates greatest inferiorly, faint CWS nasal midzone Attached, scattered MA and exudate greatest superior to disc, white without pressure temporally           Refraction     Wearing Rx       Sphere Cylinder Axis   Right -4.25 +0.75 021   Left -4.25 +1.00 042    Type: SVL           IMAGING AND PROCEDURES  Imaging and Procedures for @TODAY @  OCT, Retina - OU - Both Eyes       Right Eye Quality was good. Central Foveal Thickness: 225. Progression has improved. Findings include normal foveal contour, no SRF, intraretinal hyper-reflective material, intraretinal fluid, vitreomacular adhesion (Interval improvement in IRF/cystic changes temporal fovea and macula ).   Left Eye Quality was good. Central Foveal Thickness: 360. Progression has worsened. Findings include no SRF, abnormal foveal contour, intraretinal hyper-reflective material, intraretinal fluid (Persistent IRF SN fovea and mac -- slightly increased).   Notes *Images captured and stored on drive  Diagnosis / Impression:  +DME OU OD: Interval improvement in IRF/cystic changes temporal fovea and macula  OS: Persistent IRF SN fovea and mac --  slightly increased  Clinical management:  See below  Abbreviations: NFP - Normal foveal profile. CME - cystoid macular edema. PED - pigment epithelial detachment. IRF - intraretinal fluid. SRF - subretinal fluid. EZ - ellipsoid zone. ERM - epiretinal membrane. ORA - outer retinal atrophy. ORT - outer retinal tubulation. SRHM - subretinal hyper-reflective material      Intravitreal Injection, Pharmacologic Agent - OD - Right Eye       Time Out 09/20/2022. 2:44 PM. Confirmed correct patient, procedure, site, and patient consented.   Anesthesia Topical anesthesia was used. Anesthetic medications included Lidocaine 2%, Proparacaine 0.5%.   Procedure Preparation included 5% betadine to ocular surface, eyelid speculum. A supplied (32g) needle was used.   Injection: 1.25 mg Bevacizumab 1.25mg /0.36ml   Route: Intravitreal, Site: Right Eye   NDC: P3213405, Lot: [id number], Expiration date: 10/03/2022   Post-op Post  injection exam found visual acuity of at least counting fingers. The patient tolerated the procedure well. There were no complications. The patient received written and verbal post procedure care education. Post injection medications were not given.      Intravitreal Injection, Pharmacologic Agent - OS - Left Eye       Time Out 09/20/2022. 2:45 PM. Confirmed correct patient, procedure, site, and patient consented.   Anesthesia Topical anesthesia was used. Anesthetic medications included Lidocaine 2%, Proparacaine 0.5%.   Procedure Preparation included 5% betadine to ocular surface, eyelid speculum. A (32g) needle was used.   Injection: 2 mg aflibercept 2 MG/0.05ML   Route: Intravitreal, Site: Left Eye   NDC: L6038910, Lot: [id number], Expiration date: 10/23/2023, Waste: 0 mL   Post-op Post injection exam found visual acuity of at least counting fingers. The patient tolerated the procedure well. There were no complications. The patient received written and verbal  post procedure care education. Post injection medications were not given.            ASSESSMENT/PLAN:    ICD-10-CM   1. 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 Current use of insulin (HCC)  Z79.4     3. Long term (current) use of oral hypoglycemic drugs  Z79.84     4. Essential hypertension  I10     5. Hypertensive retinopathy of both eyes  H35.033     6. Nuclear sclerosis of both eyes  H25.13     7. Dry eyes  H04.123       1-3.  Moderate non-proliferative diabetic retinopathy w/ DME, OU  - most recent A1c 9.0% on 05.01.24  - pt lost to f/u from 10.23.2019 to 11.18.20  - FA in 2019 showed leaking MA OU; no NV OU **of note, pt had pruritic rxn to fluorescein dye -- resolved with 12.5 mg IV benadryl**  - FA 12.16.2020 shows late leaking MA's, non-perfusion defects; no NV OU - s/p IVA OD #1 (12.16.20), #2 (01.25.21), #3 (05.26.21), #4 (07.02.21), #5 (10.15.21), #6 (11.23.21), #7 (07.27.22), #8 (09.09.22), #9 (11.11.22), #10 (01.20.23), #11 (02.23.23), #12 (04.09.23), #13 (05.23.23), #14 (06.30.23), #15 (08.04.23), #16 (09.01.23), #17 (10.06.23), #18 (11.10.23) #19 (12.15.23), #20 (01.19.24), #21 (02.22.24), #22 (04.09.24), #23 (06.28.24)  - s/p focal laser OD (03.19.21), (04.01.22) - s/p IVA OS #1 (12.23.20), #2 (01.27.21), #3 (03.02.21), #4 (05.26.21), #5 (07.02.21), #6 (10.15.21), #7 (11.23.21), #8 (01.11.22), #9 (03.04.22), #10 (04.27.22), #11 (11.14.22), #12 (01.10.23), #13 (02.20.23), #14 (04.05.23), #15 (05.19.23), #16 (06.23.23), #17 (07.28.23), #18 (09.01.23), #19 (10.06.23), #20 (11.10.23) #21 (12.15.23), #22 (01.19.24), #23 (02.22.24), #24  (04.01.24), #25 (04.30.24), #26 (05.28.24), #27 (06.28.24)  - exam shows scattered MA, IRH, exudates and mild macular edema OU  - BCVA 20/25 OU -stable - OCT shows OD: Interval improvement in IRF/cystic changes temporal fovea and macula; OS: Persistent IRF SN fovea and mac -- slightly increased at 4 wks **discussed decreased efficacy / resistance to Avastin OS and potential benefit of switching medication** - Recommend IVA OD #24 and switching to IVE OS #1 today 07.29.24 w/ f/u in 4 wks  - pt wishes to proceed with injections - RBA of  procedure discussed, questions answered - see procedure note  - Avastin informed consent form re-signed and scanned on 05.19.2023  - Eylea informed consent form was signed and scanned on 07.29.24 (OU)  - f/u 4 weeks, DFE, OCT, possible injection(s)  4,5. Hypertensive retinopathy OU  - discussed importance of tight BP control             - continue to monitor   6. Nuclear Sclerosis  - The symptoms of cataract, surgical options, and treatments and risks were discussed with patient.   - discussed diagnosis and progression  - under the expert management of [facility]   7. Dry eyes OU  - likely cause of decreased vision OU today - recommend artificial tears and lubricating ointment as needed  Ophthalmic Meds Ordered this visit:  Meds ordered this encounter  Medications   aflibercept (EYLEA) SOLN 2 mg   Bevacizumab (AVASTIN) SOLN 1.25 mg    This document serves as a record of services personally performed by Karie Chimera, MD, PhD. It was created on their behalf by De Blanch, an ophthalmic technician. The creation of this record is the provider's dictation and/or activities during the visit.    Electronically signed by: De Blanch, OA, 09/20/22  5:08 PM  This document serves as a record of services personally performed by Karie Chimera, MD, PhD. It was created on their behalf by Gerilyn Nestle, COT an ophthalmic technician. The  creation of this record is the provider's dictation and/or activities during the visit.    Electronically signed by:  Charlette Caffey, COT  09/20/22 5:08 PM   Karie Chimera, M.D., Ph.D. Diseases & Surgery of the Retina and Vitreous [facility]  I have reviewed the above documentation for accuracy and completeness, and I agree with the above. Karie Chimera, M.D., Ph.D. 09/20/22 5:09 PM   Abbreviations: M myopia (nearsighted); A astigmatism; H hyperopia (farsighted); P presbyopia; Mrx spectacle prescription;  CTL contact lenses; OD right eye; OS left eye; OU both eyes  XT exotropia; ET esotropia; PEK punctate epithelial keratitis; PEE punctate epithelial erosions; DES dry eye syndrome; MGD meibomian gland dysfunction; ATs artificial tears; PFAT's preservative free artificial tears; NSC nuclear sclerotic cataract; PSC posterior subcapsular cataract; ERM epi-retinal membrane; PVD posterior vitreous detachment; RD retinal detachment; DM diabetes mellitus; DR diabetic retinopathy; NPDR non-proliferative diabetic retinopathy; PDR proliferative diabetic retinopathy; CSME clinically significant macular edema; DME diabetic macular edema; dbh dot blot hemorrhages; CWS cotton wool spot; POAG primary open angle glaucoma; C/D cup-to-disc ratio; HVF humphrey visual field; GVF goldmann visual field; OCT optical coherence tomography; IOP intraocular pressure; BRVO Branch retinal vein occlusion; CRVO central retinal vein occlusion; CRAO central retinal artery occlusion; BRAO branch retinal artery occlusion; RT retinal tear; SB scleral buckle; PPV pars plana vitrectomy; VH Vitreous hemorrhage; PRP panretinal laser photocoagulation; IVK intravitreal kenalog; VMT vitreomacular traction; MH Macular hole;  NVD neovascularization of the disc; NVE neovascularization elsewhere; AREDS age related eye disease study; ARMD age related macular degeneration; POAG primary open angle glaucoma; EBMD  epithelial/anterior basement membrane dystrophy; ACIOL anterior chamber intraocular lens; IOL intraocular lens; PCIOL posterior chamber intraocular lens; Phaco/IOL phacoemulsification with intraocular lens placement; PRK photorefractive keratectomy; LASIK laser assisted in situ keratomileusis; HTN hypertension; DM diabetes mellitus; COPD chronic obstructive pulmonary disease

## 2022-09-20 ENCOUNTER — Encounter (INDEPENDENT_AMBULATORY_CARE_PROVIDER_SITE_OTHER): Payer: Self-pay | Admitting: Ophthalmology

## 2022-09-20 ENCOUNTER — Ambulatory Visit (INDEPENDENT_AMBULATORY_CARE_PROVIDER_SITE_OTHER): Payer: Medicaid Other | Admitting: Ophthalmology

## 2022-09-20 DIAGNOSIS — Z794 Long term (current) use of insulin: Secondary | ICD-10-CM

## 2022-09-20 DIAGNOSIS — H04123 Dry eye syndrome of bilateral lacrimal glands: Secondary | ICD-10-CM

## 2022-09-20 DIAGNOSIS — E113313 Type 2 diabetes mellitus with moderate nonproliferative diabetic retinopathy with macular edema, bilateral: Secondary | ICD-10-CM

## 2022-09-20 DIAGNOSIS — Z7984 Long term (current) use of oral hypoglycemic drugs: Secondary | ICD-10-CM | POA: Diagnosis not present

## 2022-09-20 DIAGNOSIS — I10 Essential (primary) hypertension: Secondary | ICD-10-CM | POA: Diagnosis not present

## 2022-09-20 DIAGNOSIS — H2513 Age-related nuclear cataract, bilateral: Secondary | ICD-10-CM

## 2022-09-20 DIAGNOSIS — H35033 Hypertensive retinopathy, bilateral: Secondary | ICD-10-CM

## 2022-09-20 MED ORDER — AFLIBERCEPT 2MG/0.05ML IZ SOLN FOR KALEIDOSCOPE
2.0000 mg | INTRAVITREAL | Status: AC | PRN
Start: 2022-09-20 — End: 2022-09-20
  Administered 2022-09-20: 2 mg via INTRAVITREAL

## 2022-09-20 MED ORDER — BEVACIZUMAB CHEMO INJECTION 1.25MG/0.05ML SYRINGE FOR KALEIDOSCOPE
1.2500 mg | INTRAVITREAL | Status: AC | PRN
Start: 2022-09-20 — End: 2022-09-20
  Administered 2022-09-20: 1.25 mg via INTRAVITREAL

## 2022-10-18 ENCOUNTER — Encounter (INDEPENDENT_AMBULATORY_CARE_PROVIDER_SITE_OTHER): Payer: Medicaid Other | Admitting: Ophthalmology

## 2022-10-18 DIAGNOSIS — Z794 Long term (current) use of insulin: Secondary | ICD-10-CM

## 2022-10-18 DIAGNOSIS — H2513 Age-related nuclear cataract, bilateral: Secondary | ICD-10-CM

## 2022-10-18 DIAGNOSIS — H35033 Hypertensive retinopathy, bilateral: Secondary | ICD-10-CM

## 2022-10-18 DIAGNOSIS — E113313 Type 2 diabetes mellitus with moderate nonproliferative diabetic retinopathy with macular edema, bilateral: Secondary | ICD-10-CM

## 2022-10-18 DIAGNOSIS — H04123 Dry eye syndrome of bilateral lacrimal glands: Secondary | ICD-10-CM

## 2022-10-18 DIAGNOSIS — I10 Essential (primary) hypertension: Secondary | ICD-10-CM

## 2022-10-18 DIAGNOSIS — Z7984 Long term (current) use of oral hypoglycemic drugs: Secondary | ICD-10-CM

## 2022-10-22 ENCOUNTER — Encounter (INDEPENDENT_AMBULATORY_CARE_PROVIDER_SITE_OTHER): Payer: Self-pay | Admitting: Ophthalmology

## 2022-10-22 ENCOUNTER — Ambulatory Visit (INDEPENDENT_AMBULATORY_CARE_PROVIDER_SITE_OTHER): Payer: Medicaid Other | Admitting: Ophthalmology

## 2022-10-22 DIAGNOSIS — H35033 Hypertensive retinopathy, bilateral: Secondary | ICD-10-CM

## 2022-10-22 DIAGNOSIS — H04123 Dry eye syndrome of bilateral lacrimal glands: Secondary | ICD-10-CM

## 2022-10-22 DIAGNOSIS — Z794 Long term (current) use of insulin: Secondary | ICD-10-CM | POA: Diagnosis not present

## 2022-10-22 DIAGNOSIS — Z7984 Long term (current) use of oral hypoglycemic drugs: Secondary | ICD-10-CM

## 2022-10-22 DIAGNOSIS — E113313 Type 2 diabetes mellitus with moderate nonproliferative diabetic retinopathy with macular edema, bilateral: Secondary | ICD-10-CM

## 2022-10-22 DIAGNOSIS — I10 Essential (primary) hypertension: Secondary | ICD-10-CM | POA: Diagnosis not present

## 2022-10-22 DIAGNOSIS — H2513 Age-related nuclear cataract, bilateral: Secondary | ICD-10-CM

## 2022-10-22 MED ORDER — AFLIBERCEPT 2MG/0.05ML IZ SOLN FOR KALEIDOSCOPE
2.0000 mg | INTRAVITREAL | Status: AC | PRN
Start: 2022-10-22 — End: 2022-10-22
  Administered 2022-10-22: 2 mg via INTRAVITREAL

## 2022-10-22 NOTE — Progress Notes (Addendum)
[facility] - Clinic Note  10/22/2022     CHIEF COMPLAINT Patient presents for Retina Follow Up  HISTORY OF PRESENT ILLNESS: Ana Long is a 47 y.o. female who presents to the clinic today for:   HPI     Retina Follow Up   Patient presents with  Diabetic Retinopathy.  In both eyes.  This started 4 weeks ago.  I, the attending physician,  performed the HPI with the patient and updated documentation appropriately.        Comments   Patient here for 4 weeks retina follow up for NPDR OU. Patient states vision doing ok. OS has more spots and floaters. No eye pain. Wants shot in OS only today.      Last edited by Rennis Chris, MD on 10/22/2022  2:24 PM.    Patient only wants her left eye injected today due to having a 47 month old to take care of   Referring physician: Olivia Canter MD [address] [place], [place] [address]  HISTORICAL INFORMATION:   Selected notes from the MEDICAL RECORD NUMBER Re-referred by Dr. Fabian Sharp for DM exam   CURRENT MEDICATIONS: Current Outpatient Medications (Ophthalmic Drugs)  Medication Sig   Olopatadine HCl 0.2 % SOLN Place one drop into both eyes daily.   No current facility-administered medications for this visit. (Ophthalmic Drugs)   Current Outpatient Medications (Other)  Medication Sig   acyclovir (ZOVIRAX) 400 MG tablet TAKE 1 TABLET BY MOUTH TWICE A DAY   albuterol (PROAIR HFA) 108 (90 Base) MCG/ACT inhaler Inhale 2 puffs into the lungs every 4 (four) hours as needed for wheezing or shortness of breath (cough). (Patient taking differently: Inhale 2 puffs into the lungs every 4 (four) hours as needed for wheezing or shortness of breath (or coughing).)   atorvastatin (LIPITOR) 10 MG tablet Take 10 mg by mouth daily.   benzonatate (TESSALON) 200 MG capsule Take 1 capsule (200 mg total) by mouth 3 (three) times daily as needed for cough.   Blood Glucose Monitoring Suppl (ACCU-CHEK AVIVA PLUS) w/Device  KIT Please check you blood sugars 4 times a day   CIPRODEX OTIC suspension Place into the right ear.   cyclobenzaprine (FLEXERIL) 10 MG tablet Take 1 tablet (10 mg total) by mouth 3 (three) times daily as needed for muscle spasms.   DERMA-SMOOTHE/FS SCALP 0.01 % OIL Apply topically.   diclofenac (VOLTAREN) 75 MG EC tablet Take 1 tablet (75 mg total) by mouth 2 (two) times daily.   fluconazole (DIFLUCAN) 150 MG tablet TAKE 1 TABLET BY MOUTH NOW. THEN REPEAT IN 1 WEEK   furosemide (LASIX) 20 MG tablet Take 20 mg by mouth daily as needed for fluid or edema.   glucose blood (ACCU-CHEK SMARTVIEW) test strip TEST FOUR TIMES DAILY   HUMULIN R U-500 KWIKPEN 500 UNIT/ML kwikpen Inject into the skin.   hydrochlorothiazide (HYDRODIURIL) 12.5 MG tablet Take by mouth.   Insulin Pen Needle (B-D UF III MINI PEN NEEDLES) 31G X 5 MM MISC USE AS DIRECTED TO INJECT INSULIN FIVE A DAY E11.65   lactulose (CEPHULAC) 10 g packet Take 1 packet (10 g total) by mouth daily as needed. Reported on 07/29/2015 (Patient taking differently: Take 10 g by mouth daily as needed (for constipation).)   Lancets (ACCU-CHEK SOFT TOUCH) lancets Use as instructed   loratadine (CLARITIN) 10 MG tablet Take 1 tablet (10 mg total) by mouth 2 (two) times daily.   mometasone-formoterol (DULERA) 200-5  MCG/ACT AERO Inhale 2 puffs into the lungs 2 (two) times daily.   montelukast (SINGULAIR) 10 MG tablet Take 1 tablet (10 mg total) by mouth at bedtime.   nitroGLYCERIN (NITRODUR - DOSED IN MG/24 HR) 0.2 mg/hr patch Apply 1/4th patch to affected achilles, change daily   omeprazole (PRILOSEC) 20 MG capsule Take 1 capsule (20 mg total) by mouth daily before breakfast.   SPIRIVA RESPIMAT 1.25 MCG/ACT AERS SMARTSIG:2 Puff(s) Via Inhaler Daily   sulfamethoxazole-trimethoprim (BACTRIM DS) 800-160 MG tablet Take 1 tablet by mouth 2 (two) times daily.   topiramate (TOPAMAX) 25 MG tablet Take by mouth.   Current Facility-Administered Medications (Other)   Medication Route   diphenhydrAMINE (BENADRYL) injection 12.5 mg Intramuscular   REVIEW OF SYSTEMS: ROS   Positive for: Endocrine, Eyes, Respiratory Negative for: Constitutional, Gastrointestinal, Neurological, Skin, Genitourinary, Musculoskeletal, HENT, Cardiovascular, Psychiatric, Allergic/Imm, Heme/Lymph Last edited by Laddie Aquas, COA on 10/22/2022  1:33 PM.       ALLERGIES Allergies  Allergen Reactions   Bee Venom Anaphylaxis and Swelling   Wasp Venom Anaphylaxis and Swelling   Citrus Hives and Itching   Latex Hives and Swelling   Penicillins Hives and Itching    Has patient had a PCN reaction causing immediate rash, facial/tongue/throat swelling, SOB or lightheadedness with hypotension: Yes Has patient had a PCN reaction causing severe rash involving mucus membranes or skin necrosis: Unk Has patient had a PCN reaction that required hospitalization: Was already in hosp Has patient had a PCN reaction occurring within the last 10 years: No If all of the above answers are "NO", then may proceed with Cephalosporin use.    Shellfish Allergy Other (See Comments) and Swelling    Patient is uncertain; stated that she tolerates this (??)   Soap Hives, Itching and Swelling    PUREX LAUNDRY DETERGENT   Tomato Hives and Itching   Banana Itching and Nausea Only   Chocolate Itching   Fluticasone Other (See Comments)    Per patient, makes sneeze and gag   Hydrocodone Itching and Nausea Only   Other Itching    Acidic foods- Itching [place]- Itching in the roof of the mouth   Adhesive [Tape] Itching and Rash    Allergic to  "leads"   Doxycycline Other (See Comments)    AFFECTS JOINTS   Liraglutide Nausea Only   Metformin And Related Nausea Only, Rash, Other (See Comments) and Diarrhea    GI UPSET, also   Red Dye #40 (Allura Red) Itching, Rash and Other (See Comments)    At eye doctor's office, the dilating drops- Skin breaks out, chest turned red, itching, face became swollen    PAST MEDICAL HISTORY 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   Cataract    NS OU   Complication of anesthesia    states is hard to wake up post-op   Constipation 12/30/2014   Diabetes mellitus    intolerant to Metformin   Diabetic retinopathy (HCC)    NPDR OU   Dry skin    hands    Family history of anesthesia complication    mother went into coma during c-section   Genital herpes 01/01/2006   on acyclovir BID    GERD (  gastroesophageal reflux disease)    Herpes genital    Hyperglycemia 12/26/2017   Hypertensive retinopathy    OU   Low back pain 11/22/2012   Lower extremity edema 10/18/2012   Menorrhagia 11/06/2015   Morbid obesity with BMI of 50 to 60 07/23/2009   Obesity hypoventilation syndrome (HCC) 10/30/2012   Obesity, morbid (HCC) 01/10/2014   OSA on CPAP     AHI was on  11-17-12 to 120/hr, titrated to 15 cm with 3 cm EPR/    PCOS (polycystic ovarian syndrome)    Rheumatoid arthritis (HCC)    Right ankle pain 06/19/2015   Right hip pain 09/04/2017   Right shoulder pain 06/01/2017   Seasonal allergies    Sleep apnea, obstructive    uses CPAP nightly - had sleep study 08/22/2007   Past Surgical History:  Procedure Laterality Date   CARPAL TUNNEL RELEASE Right 10/02/2012   Procedure: RIGHT CARPAL TUNNEL RELEASE ENDOSCOPIC;  Surgeon: Jodi Marble, MD;  Location: [facility];  Service: Orthopedics;  Laterality: Right;   CHOLECYSTECTOMY N/A 12/02/2014   Procedure: LAPAROSCOPIC CHOLECYSTECTOMY;  Surgeon: Darnell Level, MD;  Location: [facility];  Service: General;  Laterality: N/A;   HYSTEROSCOPY WITH D & C  01-08-2002   FAMILY HISTORY Family History  Problem Relation Age of Onset   Other Mother    Diabetes Maternal  Aunt    Heart attack Maternal Aunt    Alzheimer's disease Maternal Uncle    Cancer Maternal Uncle        prostate   Diabetes Paternal Aunt    Cancer Paternal Aunt        brain,mouth   Alzheimer's disease Paternal Aunt    Colon polyps Paternal Uncle    Diabetes Paternal Uncle    Diabetes Maternal Grandmother    Diabetes Maternal Grandfather    Leukemia Cousin    Other Other    Obesity Other    Other Other    Colitis Neg Hx    SOCIAL HISTORY Social History   Tobacco Use   Smoking status: Former    Current packs/day: 0.00    Types: Cigarettes    Start date: 05/23/2000    Quit date: 05/24/2010    Years since quitting: 12.4   Smokeless tobacco: Never  Vaping Use   Vaping status: Never Used  Substance Use Topics   Alcohol use: Yes    Alcohol/week: 0.0 standard drinks of alcohol    Comment: socially   Drug use: No       OPHTHALMIC EXAM: Base Eye Exam     Visual Acuity (Snellen - Linear)       Right Left   Dist cc 20/25 -1 20/30 -2   Dist ph cc 20/25 +1 20/25 +1    Correction: Glasses         Tonometry (Tonopen, 1:29 PM)       Right Left   Pressure 16 16         Pupils       Dark Light Shape React APD   Right 3 2 Round Brisk None   Left 3 2 Round Brisk None         Visual Fields (Counting fingers)       Left Right    Full Full         Extraocular Movement       Right Left    Full, Ortho Full, Ortho         Neuro/Psych  Oriented x3: Yes   Mood/Affect: Normal         Dilation     Left eye: 1.0% Mydriacyl, 2.5% Phenylephrine @ 1:28 PM  Patient wanted only the OS dilated.          Slit Lamp and Fundus Exam     Slit Lamp Exam       Right Left   Lids/Lashes Dermatochalasis - upper lid, mild Meibomian gland dysfunction Dermatochalasis - upper lid, mild Meibomian gland dysfunction   Conjunctiva/Sclera White and quiet White and quiet   Cornea 2+Punctate epithelial erosions, mild tear film debris 3+fine Punctate epithelial  erosions   Anterior Chamber deep and quiet deep and quiet   Iris round and dilated, No NVI round and dilated   Lens 1+ NS, 1+CC 1+NS, 1+CC   Anterior Vitreous mild vitreous syneresis; PVD; mild vit condensations mild vitreous syneresis, scattered vitreous condensations, silicone oil micro bubbles trapped within vit         Fundus Exam       Right Left   Disc Pink and sharp, +cupping Pink and sharp, +cupping   C/D Ratio 0.7 0.6   Macula good foveal reflex, focal exudates ST macula, scattered MA, focal cystic changes ST macula - improved, focal laser changes inferior macula good foveal reflex, scatttered MA's and exudate, central cystic changes -- persistent, focal cluster of exudates SN macula -- persistent   Vessels attenuated, Tortuous attenuated, Tortuous   Periphery Attached, scattered MA/exudates greatest inferiorly, faint CWS nasal midzone Attached, scattered MA and exudate greatest superior to disc, white without pressure temporally           Refraction     Wearing Rx       Sphere Cylinder Axis   Right -4.25 +0.75 021   Left -4.25 +1.00 042    Type: SVL           IMAGING AND PROCEDURES  Imaging and Procedures for @TODAY @  OCT, Retina - OU - Both Eyes       Right Eye Quality was good. Central Foveal Thickness: 229. Progression has worsened. Findings include normal foveal contour, no SRF, intraretinal hyper-reflective material, intraretinal fluid, vitreomacular adhesion (Persistent IRF/cystic changes temporal fovea and macula -- slightly increased, SN macula improved).   Left Eye Quality was good. Central Foveal Thickness: 369. Progression has been stable. Findings include no SRF, abnormal foveal contour, intraretinal hyper-reflective material, intraretinal fluid (Persistent IRF SN fovea and mac ).   Notes *Images captured and stored on drive  Diagnosis / Impression:  +DME OU OD: Persistent IRF/cystic changes temporal fovea and macula -- slightly increased,  SN macula improved OS: Persistent IRF SN fovea and mac   Clinical management:  See below  Abbreviations: NFP - Normal foveal profile. CME - cystoid macular edema. PED - pigment epithelial detachment. IRF - intraretinal fluid. SRF - subretinal fluid. EZ - ellipsoid zone. ERM - epiretinal membrane. ORA - outer retinal atrophy. ORT - outer retinal tubulation. SRHM - subretinal hyper-reflective material      Intravitreal Injection, Pharmacologic Agent - OS - Left Eye       Time Out 10/22/2022. 1:40 PM. Confirmed correct patient, procedure, site, and patient consented.   Anesthesia Topical anesthesia was used. Anesthetic medications included Lidocaine 2%, Proparacaine 0.5%.   Procedure Preparation included 5% betadine to ocular surface, eyelid speculum. A (32g) needle was used.   Injection: 2 mg aflibercept 2 MG/0.05ML   Route: Intravitreal, Site: Left Eye   NDC: [id number], Lot: [id number], Expiration  date: 01/22/2024, Waste: 0 mL   Post-op Post injection exam found visual acuity of at least counting fingers. The patient tolerated the procedure well. There were no complications. The patient received written and verbal post procedure care education. Post injection medications were not given.            ASSESSMENT/PLAN:    ICD-10-CM   1. Moderate nonproliferative diabetic retinopathy of both eyes with macular edema associated with type 2 diabetes mellitus (HCC)  E11.3313 OCT, Retina - OU - Both Eyes    Intravitreal Injection, Pharmacologic Agent - OS - Left Eye    aflibercept (EYLEA) SOLN 2 mg    2. Current use of insulin (HCC)  Z79.4     3. Long term (current) use of oral hypoglycemic drugs  Z79.84     4. Essential hypertension  I10     5. Hypertensive retinopathy of both eyes  H35.033     6. Nuclear sclerosis of both eyes  H25.13     7. Dry eyes  H04.123      1-3.  Moderate non-proliferative diabetic retinopathy w/ DME, OU  - most recent A1c 9.0% on 05.01.24  -  pt lost to f/u from 10.23.2019 to 11.18.20  - FA in 2019 showed leaking MA OU; no NV OU **of note, pt had pruritic rxn to fluorescein dye -- resolved with 12.5 mg IV benadryl**  - FA 12.16.2020 shows late leaking MA's, non-perfusion defects; no NV OU - s/p IVA OD #1 (12.16.20), #2 (01.25.21), #3 (05.26.21), #4 (07.02.21), #5 (10.15.21), #6 (11.23.21), #7 (07.27.22), #8 (09.09.22), #9 (11.11.22), #10 (01.20.23), #11 (02.23.23), #12 (04.09.23), #13 (05.23.23), #14 (06.30.23), #15 (08.04.23), #16 (09.01.23), #17 (10.06.23), #18 (11.10.23) #19 (12.15.23), #20 (01.19.24), #21 (02.22.24), #22 (04.09.24), #23 (06.28.24)  - s/p focal laser OD (03.19.21), (04.01.22) - s/p IVA OS #1 (12.23.20), #2 (01.27.21), #3 (03.02.21), #4 (05.26.21), #5 (07.02.21), #6 (10.15.21), #7 (11.23.21), #8 (01.11.22), #9 (03.04.22), #10 (04.27.22), #11 (11.14.22), #12 (01.10.23), #13 (02.20.23), #14 (04.05.23), #15 (05.19.23), #16 (06.23.23), #17 (07.28.23), #18 (09.01.23), #19 (10.06.23), #20 (11.10.23) #21 (12.15.23), #22 (01.19.24), #23 (02.22.24), #24 (04.01.24), #25 (04.30.24), #26 (05.28.24), #27 (06.28.24) -- IVA Resistance - s/p IVE OS #1 (07.29.24)  - exam shows scattered MA, IRH, exudates and mild macular edema OU  - BCVA 20/25 OU -- stable - OCT shows OD: Persistent IRF/cystic changes temporal fovea and macula -- slightly increased, SN macula improved; OS: Persistent IRF SN fovea and mac at 4 wks - Recommend IVE OS #2 today 07.29.24 w/ f/u in 4 wks; pt unable to receive bilateral injections today due to other responsibilities  - pt wishes to proceed with injection OS - RBA of procedure discussed, questions answered - see procedure note  - Avastin informed consent form re-signed and scanned on 05.19.2023  - Eylea informed consent form was signed and scanned on 07.29.24 (OU)  - f/u 4 weeks, DFE, OCT, possible injection(s)  4,5. Hypertensive retinopathy OU  - discussed importance of tight BP control             -  continue to monitor   6. Nuclear Sclerosis  - The symptoms of cataract, surgical options, and treatments and risks were discussed with patient.   - discussed diagnosis and progression  - under the expert management of [facility]   7. Dry eyes OU  - likely cause of decreased vision OU today - recommend artificial tears and lubricating ointment as needed  Return in about 4 weeks (around 11/19/2022)  for +DME OU, Dilated Exam, OCT, Possible Injxn.   Ophthalmic Meds Ordered this visit:  Meds ordered this encounter  Medications   aflibercept (EYLEA) SOLN 2 mg    This document serves as a record of services personally performed by Karie Chimera, MD, PhD. It was created on their behalf by Glee Arvin. Manson Passey, OA an ophthalmic technician. The creation of this record is the provider's dictation and/or activities during the visit.    Electronically signed by: Glee Arvin. Manson Passey, OA 10/22/22 2:37 PM  Karie Chimera, M.D., Ph.D. Diseases & Surgery of the Retina and Vitreous [facility]  I have reviewed the above documentation for accuracy and completeness, and I agree with the above. Karie Chimera, M.D., Ph.D. 10/22/22 2:38 PM   Abbreviations: M myopia (nearsighted); A astigmatism; H hyperopia (farsighted); P presbyopia; Mrx spectacle prescription;  CTL contact lenses; OD right eye; OS left eye; OU both eyes  XT exotropia; ET esotropia; PEK punctate epithelial keratitis; PEE punctate epithelial erosions; DES dry eye syndrome; MGD meibomian gland dysfunction; ATs artificial tears; PFAT's preservative free artificial tears; NSC nuclear sclerotic cataract; PSC posterior subcapsular cataract; ERM epi-retinal membrane; PVD posterior vitreous detachment; RD retinal detachment; DM diabetes mellitus; DR diabetic retinopathy; NPDR non-proliferative diabetic retinopathy; PDR proliferative diabetic retinopathy; CSME clinically significant macular edema; DME diabetic macular edema; dbh  dot blot hemorrhages; CWS cotton wool spot; POAG primary open angle glaucoma; C/D cup-to-disc ratio; HVF humphrey visual field; GVF goldmann visual field; OCT optical coherence tomography; IOP intraocular pressure; BRVO Branch retinal vein occlusion; CRVO central retinal vein occlusion; CRAO central retinal artery occlusion; BRAO branch retinal artery occlusion; RT retinal tear; SB scleral buckle; PPV pars plana vitrectomy; VH Vitreous hemorrhage; PRP panretinal laser photocoagulation; IVK intravitreal kenalog; VMT vitreomacular traction; MH Macular hole;  NVD neovascularization of the disc; NVE neovascularization elsewhere; AREDS age related eye disease study; ARMD age related macular degeneration; POAG primary open angle glaucoma; EBMD epithelial/anterior basement membrane dystrophy; ACIOL anterior chamber intraocular lens; IOL intraocular lens; PCIOL posterior chamber intraocular lens; Phaco/IOL phacoemulsification with intraocular lens placement; PRK photorefractive keratectomy; LASIK laser assisted in situ keratomileusis; HTN hypertension; DM diabetes mellitus; COPD chronic obstructive pulmonary disease

## 2022-11-15 NOTE — Progress Notes (Signed)
[facility] - Clinic Note  11/19/2022     CHIEF COMPLAINT Patient presents for Retina Follow Up  HISTORY OF PRESENT ILLNESS: Ana Long is a 47 y.o. female who presents to the clinic today for:   HPI     Retina Follow Up   Patient presents with  Diabetic Retinopathy.  In both eyes.  Severity is moderate.  Duration of 4 weeks.  Since onset it is stable.  I, the attending physician,  performed the HPI with the patient and updated documentation appropriately.        Comments   Patient states vision the same OU. BS is 153 by meter on arm in office.      Last edited by Rennis Chris, MD on 11/19/2022  3:16 PM.    Pt feels like vision is okay, her left eye is sore, she has more floaters in her left eye   Referring physician: Olivia Canter MD [address] [place], [place] [address]  HISTORICAL INFORMATION:   Selected notes from the MEDICAL RECORD NUMBER Re-referred by Dr. Fabian Sharp for DM exam   CURRENT MEDICATIONS: Current Outpatient Medications (Ophthalmic Drugs)  Medication Sig   Olopatadine HCl 0.2 % SOLN Place one drop into both eyes daily.   No current facility-administered medications for this visit. (Ophthalmic Drugs)   Current Outpatient Medications (Other)  Medication Sig   acyclovir (ZOVIRAX) 400 MG tablet TAKE 1 TABLET BY MOUTH TWICE A DAY   albuterol (PROAIR HFA) 108 (90 Base) MCG/ACT inhaler Inhale 2 puffs into the lungs every 4 (four) hours as needed for wheezing or shortness of breath (cough). (Patient taking differently: Inhale 2 puffs into the lungs every 4 (four) hours as needed for wheezing or shortness of breath (or coughing).)   atorvastatin (LIPITOR) 10 MG tablet Take 10 mg by mouth daily.   benzonatate (TESSALON) 200 MG capsule Take 1 capsule (200 mg total) by mouth 3 (three) times daily as needed for cough.   Blood Glucose Monitoring Suppl (ACCU-CHEK AVIVA PLUS) w/Device KIT Please check you blood sugars 4 times a  day   CIPRODEX OTIC suspension Place into the right ear.   cyclobenzaprine (FLEXERIL) 10 MG tablet Take 1 tablet (10 mg total) by mouth 3 (three) times daily as needed for muscle spasms.   DERMA-SMOOTHE/FS SCALP 0.01 % OIL Apply topically.   diclofenac (VOLTAREN) 75 MG EC tablet Take 1 tablet (75 mg total) by mouth 2 (two) times daily.   fluconazole (DIFLUCAN) 150 MG tablet TAKE 1 TABLET BY MOUTH NOW. THEN REPEAT IN 1 WEEK   furosemide (LASIX) 20 MG tablet Take 20 mg by mouth daily as needed for fluid or edema.   glucose blood (ACCU-CHEK SMARTVIEW) test strip TEST FOUR TIMES DAILY   HUMULIN R U-500 KWIKPEN 500 UNIT/ML kwikpen Inject into the skin.   hydrochlorothiazide (HYDRODIURIL) 12.5 MG tablet Take by mouth.   Insulin Pen Needle (B-D UF III MINI PEN NEEDLES) 31G X 5 MM MISC USE AS DIRECTED TO INJECT INSULIN FIVE A DAY E11.65   lactulose (CEPHULAC) 10 g packet Take 1 packet (10 g total) by mouth daily as needed. Reported on 07/29/2015 (Patient taking differently: Take 10 g by mouth daily as needed (for constipation).)   Lancets (ACCU-CHEK SOFT TOUCH) lancets Use as instructed   loratadine (CLARITIN) 10 MG tablet Take 1 tablet (10 mg total) by mouth 2 (two) times daily.   mometasone-formoterol (DULERA) 200-5 MCG/ACT AERO Inhale 2 puffs into the  lungs 2 (two) times daily.   montelukast (SINGULAIR) 10 MG tablet Take 1 tablet (10 mg total) by mouth at bedtime.   nitroGLYCERIN (NITRODUR - DOSED IN MG/24 HR) 0.2 mg/hr patch Apply 1/4th patch to affected achilles, change daily   omeprazole (PRILOSEC) 20 MG capsule Take 1 capsule (20 mg total) by mouth daily before breakfast.   SPIRIVA RESPIMAT 1.25 MCG/ACT AERS SMARTSIG:2 Puff(s) Via Inhaler Daily   sulfamethoxazole-trimethoprim (BACTRIM DS) 800-160 MG tablet Take 1 tablet by mouth 2 (two) times daily.   topiramate (TOPAMAX) 25 MG tablet Take by mouth.   Current Facility-Administered Medications (Other)  Medication Route   diphenhydrAMINE  (BENADRYL) injection 12.5 mg Intramuscular   REVIEW OF SYSTEMS: ROS   Positive for: Endocrine, Eyes, Respiratory Negative for: Constitutional, Gastrointestinal, Neurological, Skin, Genitourinary, Musculoskeletal, HENT, Cardiovascular, Psychiatric, Allergic/Imm, Heme/Lymph Last edited by Doreene Nest, COT on 11/19/2022  1:04 PM.        ALLERGIES Allergies  Allergen Reactions   Bee Venom Anaphylaxis and Swelling   Wasp Venom Anaphylaxis and Swelling   Citrus Hives and Itching   Latex Hives and Swelling   Penicillins Hives and Itching    Has patient had a PCN reaction causing immediate rash, facial/tongue/throat swelling, SOB or lightheadedness with hypotension: Yes Has patient had a PCN reaction causing severe rash involving mucus membranes or skin necrosis: Unk Has patient had a PCN reaction that required hospitalization: Was already in hosp Has patient had a PCN reaction occurring within the last 10 years: No If all of the above answers are "NO", then may proceed with Cephalosporin use.    Shellfish Allergy Other (See Comments) and Swelling    Patient is uncertain; stated that she tolerates this (??)   Soap Hives, Itching and Swelling    PUREX LAUNDRY DETERGENT   Tomato Hives and Itching   Banana Itching and Nausea Only   Chocolate Itching   Fluticasone Other (See Comments)    Per patient, makes sneeze and gag   Hydrocodone Itching and Nausea Only   Other Itching    Acidic foods- Itching [place]- Itching in the roof of the mouth   Adhesive [Tape] Itching and Rash    Allergic to  "leads"   Doxycycline Other (See Comments)    AFFECTS JOINTS   Liraglutide Nausea Only   Metformin And Related Nausea Only, Rash, Other (See Comments) and Diarrhea    GI UPSET, also   Red Dye #40 (Allura Red) Itching, Rash and Other (See Comments)    At eye doctor's office, the dilating drops- Skin breaks out, chest turned red, itching, face became swollen   PAST MEDICAL HISTORY Past  Medical History:  Diagnosis Date   Acute nonintractable headache 12/26/2017   Arthritis    major joints   Asthma    prn inhaler   Asthma exacerbation 07/25/2013   Asthma in adult, severe persistent, with acute exacerbation 01/11/2018   Carpal tunnel syndrome of right wrist 09/2012   Cataract    NS OU   Complication of anesthesia    states is hard to wake up post-op   Constipation 12/30/2014   Diabetes mellitus    intolerant to Metformin   Diabetic retinopathy (HCC)    NPDR OU   Dry skin    hands    Family history of anesthesia complication    mother went into coma during c-section   Genital herpes 01/01/2006   on acyclovir BID    GERD (gastroesophageal reflux disease)  Herpes genital    Hyperglycemia 12/26/2017   Hypertensive retinopathy    OU   Low back pain 11/22/2012   Lower extremity edema 10/18/2012   Menorrhagia 11/06/2015   Morbid obesity with BMI of 50 to 60 07/23/2009   Obesity hypoventilation syndrome (HCC) 10/30/2012   Obesity, morbid (HCC) 01/10/2014   OSA on CPAP     AHI was on  11-17-12 to 120/hr, titrated to 15 cm with 3 cm EPR/    PCOS (polycystic ovarian syndrome)    Rheumatoid arthritis (HCC)    Right ankle pain 06/19/2015   Right hip pain 09/04/2017   Right shoulder pain 06/01/2017   Seasonal allergies    Sleep apnea, obstructive    uses CPAP nightly - had sleep study 08/22/2007   Past Surgical History:  Procedure Laterality Date   CARPAL TUNNEL RELEASE Right 10/02/2012   Procedure: RIGHT CARPAL TUNNEL RELEASE ENDOSCOPIC;  Surgeon: Jodi Marble, MD;  Location: [facility];  Service: Orthopedics;  Laterality: Right;   CHOLECYSTECTOMY N/A 12/02/2014   Procedure: LAPAROSCOPIC CHOLECYSTECTOMY;  Surgeon: Darnell Level, MD;  Location: [facility];  Service: General;  Laterality: N/A;   HYSTEROSCOPY WITH D & C  01-08-2002   FAMILY HISTORY Family History  Problem Relation Age of Onset   Other Mother    Diabetes Maternal Aunt    Heart attack Maternal  Aunt    Alzheimer's disease Maternal Uncle    Cancer Maternal Uncle        prostate   Diabetes Paternal Aunt    Cancer Paternal Aunt        brain,mouth   Alzheimer's disease Paternal Aunt    Colon polyps Paternal Uncle    Diabetes Paternal Uncle    Diabetes Maternal Grandmother    Diabetes Maternal Grandfather    Leukemia Cousin    Other Other    Obesity Other    Other Other    Colitis Neg Hx    SOCIAL HISTORY Social History   Tobacco Use   Smoking status: Former    Current packs/day: 0.00    Types: Cigarettes    Start date: 05/23/2000    Quit date: 05/24/2010    Years since quitting: 12.4   Smokeless tobacco: Never  Vaping Use   Vaping status: Never Used  Substance Use Topics   Alcohol use: Yes    Alcohol/week: 0.0 standard drinks of alcohol    Comment: socially   Drug use: No       OPHTHALMIC EXAM: Base Eye Exam     Visual Acuity (Snellen - Linear)       Right Left   Dist cc 20/25 +1 20/25 -1   Dist ph cc 20/20 -2 20/20 -2    Correction: Glasses         Tonometry (Tonopen, 1:13 PM)       Right Left   Pressure 12 12         Pupils       Dark Light Shape React APD   Right 3 2 Round Brisk None   Left 3 2 Round Brisk None         Visual Fields (Counting fingers)       Left Right    Full Full         Extraocular Movement       Right Left    Full, Ortho Full, Ortho         Neuro/Psych     Oriented x3: Yes  Mood/Affect: Normal           Slit Lamp and Fundus Exam     Slit Lamp Exam       Right Left   Lids/Lashes Dermatochalasis - upper lid, mild Meibomian gland dysfunction Dermatochalasis - upper lid, mild Meibomian gland dysfunction   Conjunctiva/Sclera White and quiet White and quiet   Cornea 2+Punctate epithelial erosions, mild tear film debris 3+fine Punctate epithelial erosions   Anterior Chamber deep and quiet deep and quiet   Iris round and dilated, No NVI round and dilated   Lens 1+ NS, 1+CC 1+NS, 1+CC    Anterior Vitreous mild vitreous syneresis; PVD; mild vit condensations mild vitreous syneresis, scattered vitreous condensations, silicone oil micro bubbles trapped within vit         Fundus Exam       Right Left   Disc Pink and sharp, +cupping Pink and sharp, +cupping   C/D Ratio 0.7 0.6   Macula good foveal reflex, focal exudates ST macula, scattered MA, focal cystic changes ST macula -- improved, focal laser changes inferior macula good foveal reflex, scatttered MA's and exudate, central cystic changes -- slightly improved, focal cluster of exudates SN macula -- persistent   Vessels attenuated, Tortuous attenuated, Tortuous   Periphery Attached, scattered MA/exudates greatest inferiorly, faint CWS nasal midzone Attached, scattered MA and exudate greatest superior to disc, white without pressure temporally           Refraction     Wearing Rx       Sphere Cylinder Axis   Right -4.25 +0.75 021   Left -4.25 +1.00 042    Type: SVL           IMAGING AND PROCEDURES  Imaging and Procedures for @TODAY @  OCT, Retina - OU - Both Eyes       Right Eye Quality was good. Central Foveal Thickness: 231. Progression has improved. Findings include normal foveal contour, no SRF, intraretinal hyper-reflective material, intraretinal fluid, vitreomacular adhesion (Persistent IRF/cystic changes temporal fovea and macula -- improved, SN macula improved).   Left Eye Quality was good. Central Foveal Thickness: 357. Progression has improved. Findings include no SRF, abnormal foveal contour, intraretinal hyper-reflective material, intraretinal fluid (Persistent IRF SN fovea and mac -- slightly improved).   Notes *Images captured and stored on drive  Diagnosis / Impression:  +DME OU OD: Persistent IRF/cystic changes temporal fovea and macula -- improved, SN macula improved OS: Persistent IRF SN fovea and mac  -- slightly improved  Clinical management:  See below  Abbreviations: NFP -  Normal foveal profile. CME - cystoid macular edema. PED - pigment epithelial detachment. IRF - intraretinal fluid. SRF - subretinal fluid. EZ - ellipsoid zone. ERM - epiretinal membrane. ORA - outer retinal atrophy. ORT - outer retinal tubulation. SRHM - subretinal hyper-reflective material      Intravitreal Injection, Pharmacologic Agent - OS - Left Eye       Time Out 11/19/2022. 1:40 PM. Confirmed correct patient, procedure, site, and patient consented.   Anesthesia Topical anesthesia was used. Anesthetic medications included Lidocaine 2%, Proparacaine 0.5%.   Procedure Preparation included 5% betadine to ocular surface, eyelid speculum. A (32g) needle was used.   Injection: 2 mg aflibercept 2 MG/0.05ML   Route: Intravitreal, Site: Left Eye   NDC: L6038910, Lot: [id number], Expiration date: 12/23/2023, Waste: 0 mL   Post-op Post injection exam found visual acuity of at least counting fingers. The patient tolerated the procedure well. There were no complications. The  patient received written and verbal post procedure care education. Post injection medications were not given.      Intravitreal Injection, Pharmacologic Agent - OD - Right Eye       Time Out 11/19/2022. 1:52 PM. Confirmed correct patient, procedure, site, and patient consented.   Anesthesia Topical anesthesia was used. Anesthetic medications included Lidocaine 2%, Proparacaine 0.5%.   Procedure Preparation included 5% betadine to ocular surface, eyelid speculum. A supplied (32g) needle was used.   Injection: 1.25 mg Bevacizumab 1.25mg /0.67ml   Route: Intravitreal, Site: Right Eye   NDC: P3213405, Lot: [id number], Expiration date: 12/27/2022   Post-op Post injection exam found visual acuity of at least counting fingers. The patient tolerated the procedure well. There were no complications. The patient received written and verbal post procedure care education. Post injection medications were not given.             ASSESSMENT/PLAN:    ICD-10-CM   1. Moderate nonproliferative diabetic retinopathy of both eyes with macular edema associated with type 2 diabetes mellitus (HCC)  E11.3313 OCT, Retina - OU - Both Eyes    Intravitreal Injection, Pharmacologic Agent - OS - Left Eye    Intravitreal Injection, Pharmacologic Agent - OD - Right Eye    aflibercept (EYLEA) SOLN 2 mg    Bevacizumab (AVASTIN) SOLN 1.25 mg    2. Current use of insulin (HCC)  Z79.4     3. Long term (current) use of oral hypoglycemic drugs  Z79.84     4. Essential hypertension  I10     5. Hypertensive retinopathy of both eyes  H35.033     6. Nuclear sclerosis of both eyes  H25.13     7. Dry eyes  H04.123      1-3.  Moderate non-proliferative diabetic retinopathy w/ DME, OU  - A1c 7.7 (08.07.24), 9.0 (05.01.24)  - pt lost to f/u from 10.23.2019 to 11.18.20  - FA in 2019 showed leaking MA OU; no NV OU **of note, pt had pruritic rxn to fluorescein dye -- resolved with 12.5 mg IV benadryl**  - FA 12.16.2020 shows late leaking MA's, non-perfusion defects; no NV OU - s/p IVA OD #1 (12.16.20), #2 (01.25.21), #3 (05.26.21), #4 (07.02.21), #5 (10.15.21), #6 (11.23.21), #7 (07.27.22), #8 (09.09.22), #9 (11.11.22), #10 (01.20.23), #11 (02.23.23), #12 (04.09.23), #13 (05.23.23), #14 (06.30.23), #15 (08.04.23), #16 (09.01.23), #17 (10.06.23), #18 (11.10.23) #19 (12.15.23), #20 (01.19.24), #21 (02.22.24), #22 (04.09.24), #23 (06.28.24), #24 (07.29.24)  - s/p focal laser OD (03.19.21), (04.01.22) - s/p IVA OS #1 (12.23.20), #2 (01.27.21), #3 (03.02.21), #4 (05.26.21), #5 (07.02.21), #6 (10.15.21), #7 (11.23.21), #8 (01.11.22), #9 (03.04.22), #10 (04.27.22), #11 (11.14.22), #12 (01.10.23), #13 (02.20.23), #14 (04.05.23), #15 (05.19.23), #16 (06.23.23), #17 (07.28.23), #18 (09.01.23), #19 (10.06.23), #20 (11.10.23) #21 (12.15.23), #22 (01.19.24), #23 (02.22.24), #24 (04.01.24), #25 (04.30.24), #26 (05.28.24), #27 (06.28.24) -- IVA  Resistance - s/p IVE OS #1 (07.29.24), #2 (08.30.24)  - exam shows scattered MA, IRH, exudates and mild macular edema OU  - BCVA 20/20 OU -- improved OU - OCT shows OD: Persistent IRF/cystic changes temporal fovea and macula -- improved, SN macula improved; OS: Persistent IRF SN fovea and mac  -- slightly improved at 4 wks - Recommend IVA OD #25 and IVE OS #3 today 09.27.24 w/ f/u in 4 wks  - pt wishes to proceed with injections - RBA of procedure discussed, questions answered - see procedure note  - Avastin informed consent form re-signed and scanned on 05.19.23  - Eylea informed  consent form was signed and scanned on 07.29.24 (OU)  - f/u 4 weeks, DFE, OCT, possible injection(s)  4,5. Hypertensive retinopathy OU  - discussed importance of tight BP control             - continue to monitor   6. Nuclear Sclerosis  - The symptoms of cataract, surgical options, and treatments and risks were discussed with patient.   - discussed diagnosis and progression  - under the expert management of [facility]   7. Dry eyes OU  - likely cause of decreased vision OU today - recommend artificial tears and lubricating ointment as needed  Return in about 4 weeks (around 12/17/2022) for f/u NPDR OU, DFE, OCT.   Ophthalmic Meds Ordered this visit:  Meds ordered this encounter  Medications   aflibercept (EYLEA) SOLN 2 mg   Bevacizumab (AVASTIN) SOLN 1.25 mg    This document serves as a record of services personally performed by Karie Chimera, MD, PhD. It was created on their behalf by Charlette Caffey, COT an ophthalmic technician. The creation of this record is the provider's dictation and/or activities during the visit.    Electronically signed by:  Charlette Caffey, COT  11/19/22 3:17 PM  This document serves as a record of services personally performed by Karie Chimera, MD, PhD. It was created on their behalf by Glee Arvin. Manson Passey, OA an ophthalmic technician. The creation of this  record is the provider's dictation and/or activities during the visit.    Electronically signed by: Glee Arvin. Manson Passey, OA 11/19/22 3:17 PM  Karie Chimera, M.D., Ph.D. Diseases & Surgery of the Retina and Vitreous [facility]  I have reviewed the above documentation for accuracy and completeness, and I agree with the above. Karie Chimera, M.D., Ph.D. 11/19/22 3:20 PM   Abbreviations: M myopia (nearsighted); A astigmatism; H hyperopia (farsighted); P presbyopia; Mrx spectacle prescription;  CTL contact lenses; OD right eye; OS left eye; OU both eyes  XT exotropia; ET esotropia; PEK punctate epithelial keratitis; PEE punctate epithelial erosions; DES dry eye syndrome; MGD meibomian gland dysfunction; ATs artificial tears; PFAT's preservative free artificial tears; NSC nuclear sclerotic cataract; PSC posterior subcapsular cataract; ERM epi-retinal membrane; PVD posterior vitreous detachment; RD retinal detachment; DM diabetes mellitus; DR diabetic retinopathy; NPDR non-proliferative diabetic retinopathy; PDR proliferative diabetic retinopathy; CSME clinically significant macular edema; DME diabetic macular edema; dbh dot blot hemorrhages; CWS cotton wool spot; POAG primary open angle glaucoma; C/D cup-to-disc ratio; HVF humphrey visual field; GVF goldmann visual field; OCT optical coherence tomography; IOP intraocular pressure; BRVO Branch retinal vein occlusion; CRVO central retinal vein occlusion; CRAO central retinal artery occlusion; BRAO branch retinal artery occlusion; RT retinal tear; SB scleral buckle; PPV pars plana vitrectomy; VH Vitreous hemorrhage; PRP panretinal laser photocoagulation; IVK intravitreal kenalog; VMT vitreomacular traction; MH Macular hole;  NVD neovascularization of the disc; NVE neovascularization elsewhere; AREDS age related eye disease study; ARMD age related macular degeneration; POAG primary open angle glaucoma; EBMD epithelial/anterior basement  membrane dystrophy; ACIOL anterior chamber intraocular lens; IOL intraocular lens; PCIOL posterior chamber intraocular lens; Phaco/IOL phacoemulsification with intraocular lens placement; PRK photorefractive keratectomy; LASIK laser assisted in situ keratomileusis; HTN hypertension; DM diabetes mellitus; COPD chronic obstructive pulmonary disease

## 2022-11-19 ENCOUNTER — Encounter (INDEPENDENT_AMBULATORY_CARE_PROVIDER_SITE_OTHER): Payer: Self-pay | Admitting: Ophthalmology

## 2022-11-19 ENCOUNTER — Ambulatory Visit (INDEPENDENT_AMBULATORY_CARE_PROVIDER_SITE_OTHER): Payer: Medicaid Other | Admitting: Ophthalmology

## 2022-11-19 DIAGNOSIS — H35033 Hypertensive retinopathy, bilateral: Secondary | ICD-10-CM

## 2022-11-19 DIAGNOSIS — Z794 Long term (current) use of insulin: Secondary | ICD-10-CM

## 2022-11-19 DIAGNOSIS — Z7984 Long term (current) use of oral hypoglycemic drugs: Secondary | ICD-10-CM | POA: Diagnosis not present

## 2022-11-19 DIAGNOSIS — I10 Essential (primary) hypertension: Secondary | ICD-10-CM | POA: Diagnosis not present

## 2022-11-19 DIAGNOSIS — H2513 Age-related nuclear cataract, bilateral: Secondary | ICD-10-CM

## 2022-11-19 DIAGNOSIS — H04123 Dry eye syndrome of bilateral lacrimal glands: Secondary | ICD-10-CM

## 2022-11-19 DIAGNOSIS — E113313 Type 2 diabetes mellitus with moderate nonproliferative diabetic retinopathy with macular edema, bilateral: Secondary | ICD-10-CM | POA: Diagnosis not present

## 2022-11-19 MED ORDER — AFLIBERCEPT 2MG/0.05ML IZ SOLN FOR KALEIDOSCOPE
2.0000 mg | INTRAVITREAL | Status: AC | PRN
Start: 2022-11-19 — End: 2022-11-19
  Administered 2022-11-19: 2 mg via INTRAVITREAL

## 2022-11-19 MED ORDER — BEVACIZUMAB CHEMO INJECTION 1.25MG/0.05ML SYRINGE FOR KALEIDOSCOPE
1.2500 mg | INTRAVITREAL | Status: AC | PRN
Start: 2022-11-19 — End: 2022-11-19
  Administered 2022-11-19: 1.25 mg via INTRAVITREAL

## 2022-12-16 NOTE — Progress Notes (Signed)
[facility] - Clinic Note  12/17/2022     CHIEF COMPLAINT Patient presents for Retina Follow Up  HISTORY OF PRESENT ILLNESS: Ana Long is a 47 y.o. female who presents to the clinic today for:   HPI     Retina Follow Up   Patient presents with  Diabetic Retinopathy.  In both eyes.  Severity is moderate.  Duration of 4 weeks.  Since onset it is stable.  I, the attending physician,  performed the HPI with the patient and updated documentation appropriately.        Comments   Pt here for 4 wk ret f/u NPDR OU. Pt states VA is the same, does see new floater in OD for 2 days. Stays in the center of [place].       Last edited by Rennis Chris, MD on 12/17/2022  4:10 PM.    Pt states she has a new floater in her left eye, she states her blood sugar and blood pressure has been "great"   Referring physician: Olivia Canter MD [address] [place], [place] [address]  HISTORICAL INFORMATION:   Selected notes from the MEDICAL RECORD NUMBER Re-referred by Dr. Fabian Sharp for DM exam   CURRENT MEDICATIONS: Current Outpatient Medications (Ophthalmic Drugs)  Medication Sig   Olopatadine HCl 0.2 % SOLN Place one drop into both eyes daily.   No current facility-administered medications for this visit. (Ophthalmic Drugs)   Current Outpatient Medications (Other)  Medication Sig   acyclovir (ZOVIRAX) 400 MG tablet TAKE 1 TABLET BY MOUTH TWICE A DAY   albuterol (PROAIR HFA) 108 (90 Base) MCG/ACT inhaler Inhale 2 puffs into the lungs every 4 (four) hours as needed for wheezing or shortness of breath (cough). (Patient taking differently: Inhale 2 puffs into the lungs every 4 (four) hours as needed for wheezing or shortness of breath (or coughing).)   atorvastatin (LIPITOR) 10 MG tablet Take 10 mg by mouth daily.   benzonatate (TESSALON) 200 MG capsule Take 1 capsule (200 mg total) by mouth 3 (three) times daily as needed for cough.   Blood Glucose Monitoring  Suppl (ACCU-CHEK AVIVA PLUS) w/Device KIT Please check you blood sugars 4 times a day   CIPRODEX OTIC suspension Place into the right ear.   cyclobenzaprine (FLEXERIL) 10 MG tablet Take 1 tablet (10 mg total) by mouth 3 (three) times daily as needed for muscle spasms.   DERMA-SMOOTHE/FS SCALP 0.01 % OIL Apply topically.   diclofenac (VOLTAREN) 75 MG EC tablet Take 1 tablet (75 mg total) by mouth 2 (two) times daily.   fluconazole (DIFLUCAN) 150 MG tablet TAKE 1 TABLET BY MOUTH NOW. THEN REPEAT IN 1 WEEK   furosemide (LASIX) 20 MG tablet Take 20 mg by mouth daily as needed for fluid or edema.   glucose blood (ACCU-CHEK SMARTVIEW) test strip TEST FOUR TIMES DAILY   HUMULIN R U-500 KWIKPEN 500 UNIT/ML kwikpen Inject into the skin.   hydrochlorothiazide (HYDRODIURIL) 12.5 MG tablet Take by mouth.   Insulin Pen Needle (B-D UF III MINI PEN NEEDLES) 31G X 5 MM MISC USE AS DIRECTED TO INJECT INSULIN FIVE A DAY E11.65   lactulose (CEPHULAC) 10 g packet Take 1 packet (10 g total) by mouth daily as needed. Reported on 07/29/2015 (Patient taking differently: Take 10 g by mouth daily as needed (for constipation).)   Lancets (ACCU-CHEK SOFT TOUCH) lancets Use as instructed   loratadine (CLARITIN) 10 MG tablet Take 1 tablet (10  mg total) by mouth 2 (two) times daily.   mometasone-formoterol (DULERA) 200-5 MCG/ACT AERO Inhale 2 puffs into the lungs 2 (two) times daily.   montelukast (SINGULAIR) 10 MG tablet Take 1 tablet (10 mg total) by mouth at bedtime.   nitroGLYCERIN (NITRODUR - DOSED IN MG/24 HR) 0.2 mg/hr patch Apply 1/4th patch to affected achilles, change daily   omeprazole (PRILOSEC) 20 MG capsule Take 1 capsule (20 mg total) by mouth daily before breakfast.   SPIRIVA RESPIMAT 1.25 MCG/ACT AERS SMARTSIG:2 Puff(s) Via Inhaler Daily   sulfamethoxazole-trimethoprim (BACTRIM DS) 800-160 MG tablet Take 1 tablet by mouth 2 (two) times daily.   topiramate (TOPAMAX) 25 MG tablet Take by mouth.   Current  Facility-Administered Medications (Other)  Medication Route   diphenhydrAMINE (BENADRYL) injection 12.5 mg Intramuscular   REVIEW OF SYSTEMS: ROS   Positive for: Endocrine, Eyes, Respiratory Negative for: Constitutional, Gastrointestinal, Neurological, Skin, Genitourinary, Musculoskeletal, HENT, Cardiovascular, Psychiatric, Allergic/Imm, Heme/Lymph Last edited by Thompson Grayer, COT on 12/17/2022  1:24 PM.     ALLERGIES Allergies  Allergen Reactions   Bee Venom Anaphylaxis and Swelling   Wasp Venom Anaphylaxis and Swelling   Citrus Hives and Itching   Latex Hives and Swelling   Penicillins Hives and Itching    Has patient had a PCN reaction causing immediate rash, facial/tongue/throat swelling, SOB or lightheadedness with hypotension: Yes Has patient had a PCN reaction causing severe rash involving mucus membranes or skin necrosis: Unk Has patient had a PCN reaction that required hospitalization: Was already in hosp Has patient had a PCN reaction occurring within the last 10 years: No If all of the above answers are "NO", then may proceed with Cephalosporin use.    Shellfish Allergy Other (See Comments) and Swelling    Patient is uncertain; stated that she tolerates this (??)   Soap Hives, Itching and Swelling    PUREX LAUNDRY DETERGENT   Tomato Hives and Itching   Banana Itching and Nausea Only   Chocolate Itching   Fluticasone Other (See Comments)    Per patient, makes sneeze and gag   Hydrocodone Itching and Nausea Only   Other Itching    Acidic foods- Itching [place]- Itching in the roof of the mouth   Adhesive [Tape] Itching and Rash    Allergic to  "leads"   Doxycycline Other (See Comments)    AFFECTS JOINTS   Liraglutide Nausea Only   Metformin And Related Nausea Only, Rash, Other (See Comments) and Diarrhea    GI UPSET, also   Red Dye #40 (Allura Red) Itching, Rash and Other (See Comments)    At eye doctor's office, the dilating drops- Skin breaks out, chest  turned red, itching, face became swollen   PAST MEDICAL HISTORY 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   Cataract    NS OU   Complication of anesthesia    states is hard to wake up post-op   Constipation 12/30/2014   Diabetes mellitus    intolerant to Metformin   Diabetic retinopathy (HCC)    NPDR OU   Dry skin    hands    Family history of anesthesia complication    mother went into coma during c-section   Genital  herpes 01/01/2006   on acyclovir BID    GERD (gastroesophageal reflux disease)    Herpes genital    Hyperglycemia 12/26/2017   Hypertensive retinopathy    OU   Low back pain 11/22/2012   Lower extremity edema 10/18/2012   Menorrhagia 11/06/2015   Morbid obesity with BMI of 50 to 60 07/23/2009   Obesity hypoventilation syndrome (HCC) 10/30/2012   Obesity, morbid (HCC) 01/10/2014   OSA on CPAP     AHI was on  11-17-12 to 120/hr, titrated to 15 cm with 3 cm EPR/    PCOS (polycystic ovarian syndrome)    Rheumatoid arthritis (HCC)    Right ankle pain 06/19/2015   Right hip pain 09/04/2017   Right shoulder pain 06/01/2017   Seasonal allergies    Sleep apnea, obstructive    uses CPAP nightly - had sleep study 08/22/2007   Past Surgical History:  Procedure Laterality Date   CARPAL TUNNEL RELEASE Right 10/02/2012   Procedure: RIGHT CARPAL TUNNEL RELEASE ENDOSCOPIC;  Surgeon: Jodi Marble, MD;  Location: [facility];  Service: Orthopedics;  Laterality: Right;   CHOLECYSTECTOMY N/A 12/02/2014   Procedure: LAPAROSCOPIC CHOLECYSTECTOMY;  Surgeon: Darnell Level, MD;  Location: [facility];  Service: General;  Laterality: N/A;   HYSTEROSCOPY WITH D & C  01-08-2002   FAMILY HISTORY Family History  Problem Relation Age of Onset    Other Mother    Diabetes Maternal Aunt    Heart attack Maternal Aunt    Alzheimer's disease Maternal Uncle    Cancer Maternal Uncle        prostate   Diabetes Paternal Aunt    Cancer Paternal Aunt        brain,mouth   Alzheimer's disease Paternal Aunt    Colon polyps Paternal Uncle    Diabetes Paternal Uncle    Diabetes Maternal Grandmother    Diabetes Maternal Grandfather    Leukemia Cousin    Other Other    Obesity Other    Other Other    Colitis Neg Hx    SOCIAL HISTORY Social History   Tobacco Use   Smoking status: Former    Current packs/day: 0.00    Types: Cigarettes    Start date: 05/23/2000    Quit date: 05/24/2010    Years since quitting: 12.5   Smokeless tobacco: Never  Vaping Use   Vaping status: Never Used  Substance Use Topics   Alcohol use: Yes    Alcohol/week: 0.0 standard drinks of alcohol    Comment: socially   Drug use: No       OPHTHALMIC EXAM: Base Eye Exam     Visual Acuity (Snellen - Linear)       Right Left   Dist cc 20/20 -2 20/25 -2   Dist ph cc  20/20 -2    Correction: Glasses         Tonometry (Tonopen, 1:31 PM)       Right Left   Pressure 14 12         Pupils       Pupils Dark Light Shape React APD   Right PERRL 3 2 Round Brisk None   Left PERRL 3 2 Round Brisk None         Visual Fields (Counting fingers)       Left Right    Full Full         Extraocular Movement       Right Left    Full,  Ortho Full, Ortho         Neuro/Psych     Oriented x3: Yes   Mood/Affect: Normal         Dilation     Both eyes: 1.0% Mydriacyl, 2.5% Phenylephrine @ 1:31 PM           Slit Lamp and Fundus Exam     Slit Lamp Exam       Right Left   Lids/Lashes Dermatochalasis - upper lid, mild Meibomian gland dysfunction Dermatochalasis - upper lid, mild Meibomian gland dysfunction   Conjunctiva/Sclera White and quiet White and quiet   Cornea 2+Punctate epithelial erosions, mild tear film debris 3+fine Punctate  epithelial erosions   Anterior Chamber deep and quiet deep and quiet   Iris round and dilated, No NVI round and dilated   Lens 1+ NS, 1+CC 1+NS, 1+CC   Anterior Vitreous mild vitreous syneresis; PVD; mild vit condensations mild vitreous syneresis, scattered vitreous condensations, silicone oil micro bubbles trapped within vit         Fundus Exam       Right Left   Disc Pink and sharp, +cupping Pink and sharp, +cupping   C/D Ratio 0.7 0.6   Macula good foveal reflex, focal exudates ST macula, scattered MA, focal cystic changes ST macula, focal laser changes inferior macula good foveal reflex, scatttered MA's and exudate, central cystic changes -- slightly improved, focal cluster of exudates SN macula -- persistent   Vessels attenuated, Tortuous attenuated, Tortuous   Periphery Attached, scattered MA/exudates greatest inferiorly, faint CWS nasal midzone Attached, scattered MA and exudate greatest superior to disc, white without pressure temporally           Refraction     Wearing Rx       Sphere Cylinder Axis   Right -4.25 +0.75 021   Left -4.25 +1.00 042    Type: SVL           IMAGING AND PROCEDURES  Imaging and Procedures for @TODAY @  OCT, Retina - OU - Both Eyes       Right Eye Quality was good. Central Foveal Thickness: 226. Progression has worsened. Findings include normal foveal contour, no SRF, intraretinal hyper-reflective material, intraretinal fluid, vitreomacular adhesion (Persistent IRF/cystic changes temporal fovea and macula -- slightly increased).   Left Eye Quality was good. Central Foveal Thickness: 320. Progression has improved. Findings include no SRF, abnormal foveal contour, intraretinal hyper-reflective material, intraretinal fluid (Persistent IRF / IRHM SN fovea and mac -- slightly improved).   Notes *Images captured and stored on drive  Diagnosis / Impression:  +DME OU OD: Persistent IRF/cystic changes temporal fovea and macula -- slightly  increased OS: Persistent IRF / IRHM SN fovea and mac  -- slightly improved  Clinical management:  See below  Abbreviations: NFP - Normal foveal profile. CME - cystoid macular edema. PED - pigment epithelial detachment. IRF - intraretinal fluid. SRF - subretinal fluid. EZ - ellipsoid zone. ERM - epiretinal membrane. ORA - outer retinal atrophy. ORT - outer retinal tubulation. SRHM - subretinal hyper-reflective material      Intravitreal Injection, Pharmacologic Agent - OD - Right Eye       Time Out 12/17/2022. 2:13 PM. Confirmed correct patient, procedure, site, and patient consented.   Anesthesia Topical anesthesia was used. Anesthetic medications included Lidocaine 2%, Proparacaine 0.5%.   Procedure Preparation included 5% betadine to ocular surface, eyelid speculum. A supplied (32g) needle was used.   Injection: 1.25 mg Bevacizumab 1.25mg /0.8ml   Route:  Intravitreal, Site: Right Eye   NDC: P3213405, Lot: [id number], Expiration date: 01/31/2023   Post-op Post injection exam found visual acuity of at least counting fingers. The patient tolerated the procedure well. There were no complications. The patient received written and verbal post procedure care education. Post injection medications were not given.      Intravitreal Injection, Pharmacologic Agent - OS - Left Eye       Time Out 12/17/2022. 2:13 PM. Confirmed correct patient, procedure, site, and patient consented.   Anesthesia Topical anesthesia was used. Anesthetic medications included Lidocaine 2%, Proparacaine 0.5%.   Procedure Preparation included 5% betadine to ocular surface, eyelid speculum. A (32g) needle was used.   Injection: 2 mg aflibercept 2 MG/0.05ML   Route: Intravitreal, Site: Left Eye   NDC: L6038910, Lot: [id number], Expiration date: 10/23/2023, Waste: 0 mL   Post-op Post injection exam found visual acuity of at least counting fingers. The patient tolerated the procedure well. There were  no complications. The patient received written and verbal post procedure care education. Post injection medications were not given.            ASSESSMENT/PLAN:    ICD-10-CM   1. 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 Current use of insulin (HCC)  Z79.4     3. Long term (current) use of oral hypoglycemic drugs  Z79.84     4. Essential hypertension  I10     5. Hypertensive retinopathy of both eyes  H35.033     6. Nuclear sclerosis of both eyes  H25.13     7. Dry eyes  H04.123      1-3.  Moderate non-proliferative diabetic retinopathy w/ DME, OU  - A1c 7.7 (08.07.24), 9.0 (05.01.24)  - pt lost to f/u from 10.23.2019 to 11.18.20  - FA in 2019 showed leaking MA OU; no NV OU **of note, pt had pruritic rxn to fluorescein dye -- resolved with 12.5 mg IV benadryl**  - FA 12.16.2020 shows late leaking MA's, non-perfusion defects; no NV OU - s/p IVA OD #1 (12.16.20), #2 (01.25.21), #3 (05.26.21), #4 (07.02.21), #5 (10.15.21), #6 (11.23.21), #7 (07.27.22), #8 (09.09.22), #9 (11.11.22), #10 (01.20.23), #11 (02.23.23), #12 (04.09.23), #13 (05.23.23), #14 (06.30.23), #15 (08.04.23), #16 (09.01.23), #17 (10.06.23), #18 (11.10.23) #19 (12.15.23), #20 (01.19.24), #21 (02.22.24), #22 (04.09.24), #23 (06.28.24), #24 (07.29.24), #25 (09.27.24)  - s/p focal laser OD (03.19.21), (04.01.22) - s/p IVA OS #1 (12.23.20), #2 (01.27.21), #3 (03.02.21), #4 (05.26.21), #5 (07.02.21), #6 (10.15.21), #7 (11.23.21), #8 (01.11.22), #9 (03.04.22), #10 (04.27.22), #11 (11.14.22), #12 (01.10.23), #13 (02.20.23), #14 (04.05.23), #15 (05.19.23), #16 (06.23.23), #17 (07.28.23), #18 (09.01.23),  #19 (10.06.23), #20 (11.10.23) #21 (12.15.23), #22 (01.19.24), #23 (02.22.24), #24 (04.01.24), #25 (04.30.24), #26 (05.28.24), #27 (06.28.24) -- IVA Resistance - s/p IVE OS #1 (07.29.24), #2 (08.30.24), #3 (09.24.24)  - exam shows scattered MA, IRH, exudates and mild macular edema OU  - BCVA 20/20 OU -- improved OU - OCT shows OD: Persistent IRF/cystic changes temporal fovea and macula -- improved, SN macula improved; OS: Persistent IRF SN fovea and mac  -- slightly improved at 4 wks - Recommend IVA OD #26  and IVE OS #4 today, 10.25.24 w/ f/u in 4 wks  - pt wishes to proceed with injections - RBA of procedure discussed, questions answered - see procedure note  - Avastin informed consent form re-signed and scanned on 05.19.23  - Eylea informed consent form was signed and scanned on 07.29.24 (OU)  - f/u 4 weeks, DFE, OCT, possible injection(s)  4,5. Hypertensive retinopathy OU  - discussed importance of tight BP control             - continue to monitor   6. Nuclear Sclerosis  - The symptoms of cataract, surgical options, and treatments and risks were discussed with patient.   - discussed diagnosis and progression  - under the expert management of [facility]   7. Dry eyes OU - recommend artificial tears and lubricating ointment as needed  Return in about 4 weeks (around 01/14/2023) for f/u NPDR OU, DFE, OCT.   Ophthalmic Meds Ordered this visit:  Meds ordered this encounter  Medications   aflibercept (EYLEA) SOLN 2 mg   Bevacizumab (AVASTIN) SOLN 1.25 mg    This document serves as a record of services personally performed by Karie Chimera, MD, PhD. It was created on their behalf by Berlin Hun COT, an ophthalmic technician. The creation of this record is the provider's dictation and/or activities during the visit.    Electronically signed by: Berlin Hun COT 10.24.244:10 PM  This document serves as a record of services personally performed by Karie Chimera,  MD, PhD. It was created on their behalf by Glee Arvin. Manson Passey, OA an ophthalmic technician. The creation of this record is the provider's dictation and/or activities during the visit.    Electronically signed by: Glee Arvin. Manson Passey, OA 12/17/22 4:10 PM  Karie Chimera, M.D., Ph.D. Diseases & Surgery of the Retina and Vitreous [facility] 12/17/2022   I have reviewed the above documentation for accuracy and completeness, and I agree with the above. Karie Chimera, M.D., Ph.D. 12/17/22 4:11 PM   Abbreviations: M myopia (nearsighted); A astigmatism; H hyperopia (farsighted); P presbyopia; Mrx spectacle prescription;  CTL contact lenses; OD right eye; OS left eye; OU both eyes  XT exotropia; ET esotropia; PEK punctate epithelial keratitis; PEE punctate epithelial erosions; DES dry eye syndrome; MGD meibomian gland dysfunction; ATs artificial tears; PFAT's preservative free artificial tears; NSC nuclear sclerotic cataract; PSC posterior subcapsular cataract; ERM epi-retinal membrane; PVD posterior vitreous detachment; RD retinal detachment; DM diabetes mellitus; DR diabetic retinopathy; NPDR non-proliferative diabetic retinopathy; PDR proliferative diabetic retinopathy; CSME clinically significant macular edema; DME diabetic macular edema; dbh dot blot hemorrhages; CWS cotton wool spot; POAG primary open angle glaucoma; C/D cup-to-disc ratio; HVF humphrey visual field; GVF goldmann visual field; OCT optical coherence tomography; IOP intraocular pressure; BRVO Branch retinal vein occlusion; CRVO central retinal vein occlusion; CRAO central retinal artery occlusion; BRAO branch retinal artery occlusion; RT retinal tear; SB scleral buckle; PPV pars plana vitrectomy; VH Vitreous hemorrhage; PRP panretinal laser photocoagulation; IVK intravitreal kenalog; VMT vitreomacular traction; MH Macular hole;  NVD neovascularization of the disc; NVE neovascularization elsewhere; AREDS age related eye  disease study; ARMD age related macular degeneration; POAG primary open angle glaucoma; EBMD epithelial/anterior basement membrane dystrophy; ACIOL anterior chamber intraocular lens; IOL intraocular lens; PCIOL posterior chamber intraocular lens; Phaco/IOL phacoemulsification with intraocular lens placement; PRK photorefractive keratectomy; LASIK laser assisted in situ keratomileusis; HTN hypertension; DM diabetes mellitus; COPD chronic obstructive pulmonary disease

## 2022-12-17 ENCOUNTER — Ambulatory Visit (INDEPENDENT_AMBULATORY_CARE_PROVIDER_SITE_OTHER): Payer: Medicaid Other | Admitting: Ophthalmology

## 2022-12-17 ENCOUNTER — Encounter (INDEPENDENT_AMBULATORY_CARE_PROVIDER_SITE_OTHER): Payer: Self-pay | Admitting: Ophthalmology

## 2022-12-17 DIAGNOSIS — Z7984 Long term (current) use of oral hypoglycemic drugs: Secondary | ICD-10-CM | POA: Diagnosis not present

## 2022-12-17 DIAGNOSIS — Z794 Long term (current) use of insulin: Secondary | ICD-10-CM

## 2022-12-17 DIAGNOSIS — I10 Essential (primary) hypertension: Secondary | ICD-10-CM | POA: Diagnosis not present

## 2022-12-17 DIAGNOSIS — E113313 Type 2 diabetes mellitus with moderate nonproliferative diabetic retinopathy with macular edema, bilateral: Secondary | ICD-10-CM | POA: Diagnosis not present

## 2022-12-17 DIAGNOSIS — H35033 Hypertensive retinopathy, bilateral: Secondary | ICD-10-CM

## 2022-12-17 DIAGNOSIS — H04123 Dry eye syndrome of bilateral lacrimal glands: Secondary | ICD-10-CM

## 2022-12-17 DIAGNOSIS — H2513 Age-related nuclear cataract, bilateral: Secondary | ICD-10-CM

## 2022-12-17 MED ORDER — AFLIBERCEPT 2MG/0.05ML IZ SOLN FOR KALEIDOSCOPE
2.0000 mg | INTRAVITREAL | Status: AC | PRN
Start: 2022-12-17 — End: 2022-12-17
  Administered 2022-12-17: 2 mg via INTRAVITREAL

## 2022-12-17 MED ORDER — BEVACIZUMAB CHEMO INJECTION 1.25MG/0.05ML SYRINGE FOR KALEIDOSCOPE
1.2500 mg | INTRAVITREAL | Status: AC | PRN
Start: 2022-12-17 — End: 2022-12-17
  Administered 2022-12-17: 1.25 mg via INTRAVITREAL

## 2023-01-18 ENCOUNTER — Encounter (INDEPENDENT_AMBULATORY_CARE_PROVIDER_SITE_OTHER): Payer: Medicaid Other | Admitting: Ophthalmology

## 2023-01-18 DIAGNOSIS — H04123 Dry eye syndrome of bilateral lacrimal glands: Secondary | ICD-10-CM

## 2023-01-18 DIAGNOSIS — I10 Essential (primary) hypertension: Secondary | ICD-10-CM

## 2023-01-18 DIAGNOSIS — Z7984 Long term (current) use of oral hypoglycemic drugs: Secondary | ICD-10-CM

## 2023-01-18 DIAGNOSIS — E113313 Type 2 diabetes mellitus with moderate nonproliferative diabetic retinopathy with macular edema, bilateral: Secondary | ICD-10-CM

## 2023-01-18 DIAGNOSIS — H35033 Hypertensive retinopathy, bilateral: Secondary | ICD-10-CM

## 2023-01-18 DIAGNOSIS — H2513 Age-related nuclear cataract, bilateral: Secondary | ICD-10-CM

## 2023-01-18 DIAGNOSIS — Z794 Long term (current) use of insulin: Secondary | ICD-10-CM

## 2023-03-07 NOTE — Progress Notes (Signed)
 [facility] - Clinic Note  03/09/2023     CHIEF COMPLAINT Patient presents for Retina Follow Up  HISTORY OF PRESENT ILLNESS: Ana Long is a 48 y.o. female who presents to the clinic today for:   HPI     Retina Follow Up   Patient presents with  Diabetic Retinopathy.  In both eyes.  This started 12 weeks ago.  Duration of 12 weeks.  Since onset it is stable.  I, the attending physician,  performed the HPI with the patient and updated documentation appropriately.        Comments   12 week retina follow up NPDR OU and IVA OD and I'VE OS pt is reporting more floaters sometimes some blurred vision watering and sticky feeling her last reading 124 a week ago A1C 6.6      Last edited by Ronelle Coffee, MD on 03/09/2023  4:53 PM.    Patient states that vision is blurry and she is seeing more floaters.   Referring physician: Sidra Dredge MD [address] [place], [place] [address]  HISTORICAL INFORMATION:   Selected notes from the MEDICAL RECORD NUMBER Re-referred by Dr. Diedre Fox for DM exam   CURRENT MEDICATIONS: Current Outpatient Medications (Ophthalmic Drugs)  Medication Sig   Olopatadine HCl 0.2 % SOLN Place one drop into both eyes daily.   No current facility-administered medications for this visit. (Ophthalmic Drugs)   Current Outpatient Medications (Other)  Medication Sig   acyclovir  (ZOVIRAX ) 400 MG tablet TAKE 1 TABLET BY MOUTH TWICE A DAY   albuterol  (PROAIR  HFA) 108 (90 Base) MCG/ACT inhaler Inhale 2 puffs into the lungs every 4 (four) hours as needed for wheezing or shortness of breath (cough). (Patient taking differently: Inhale 2 puffs into the lungs every 4 (four) hours as needed for wheezing or shortness of breath (or coughing).)   atorvastatin  (LIPITOR) 10 MG tablet Take 10 mg by mouth daily.   benzonatate  (TESSALON ) 200 MG capsule Take 1 capsule (200 mg total) by mouth 3 (three) times daily as needed for cough.   Blood  Glucose Monitoring Suppl (ACCU-CHEK AVIVA PLUS) w/Device KIT Please check you blood sugars 4 times a day   CIPRODEX OTIC suspension Place into the right ear.   cyclobenzaprine  (FLEXERIL ) 10 MG tablet Take 1 tablet (10 mg total) by mouth 3 (three) times daily as needed for muscle spasms.   DERMA-SMOOTHE/FS SCALP 0.01 % OIL Apply topically.   diclofenac  (VOLTAREN ) 75 MG EC tablet Take 1 tablet (75 mg total) by mouth 2 (two) times daily.   fluconazole  (DIFLUCAN ) 150 MG tablet TAKE 1 TABLET BY MOUTH NOW. THEN REPEAT IN 1 WEEK   furosemide  (LASIX ) 20 MG tablet Take 20 mg by mouth daily as needed for fluid or edema.   glucose blood (ACCU-CHEK SMARTVIEW) test strip TEST FOUR TIMES DAILY   HUMULIN  R U-500 KWIKPEN 500 UNIT/ML kwikpen Inject into the skin.   hydrochlorothiazide (HYDRODIURIL) 12.5 MG tablet Take by mouth.   Insulin  Pen Needle (B-D UF III MINI PEN NEEDLES) 31G X 5 MM MISC USE AS DIRECTED TO INJECT INSULIN  FIVE A DAY E11.65   lactulose  (CEPHULAC ) 10 g packet Take 1 packet (10 g total) by mouth daily as needed. Reported on 07/29/2015 (Patient taking differently: Take 10 g by mouth daily as needed (for constipation).)   Lancets (ACCU-CHEK SOFT TOUCH) lancets Use as instructed   loratadine  (CLARITIN ) 10 MG tablet Take 1 tablet (10 mg total) by mouth 2 (  two) times daily.   mometasone -formoterol  (DULERA ) 200-5 MCG/ACT AERO Inhale 2 puffs into the lungs 2 (two) times daily.   montelukast  (SINGULAIR ) 10 MG tablet Take 1 tablet (10 mg total) by mouth at bedtime.   nitroGLYCERIN  (NITRODUR - DOSED IN MG/24 HR) 0.2 mg/hr patch Apply 1/4th patch to affected achilles, change daily   omeprazole  (PRILOSEC) 20 MG capsule Take 1 capsule (20 mg total) by mouth daily before breakfast.   SPIRIVA RESPIMAT 1.25 MCG/ACT AERS SMARTSIG:2 Puff(s) Via Inhaler Daily   sulfamethoxazole -trimethoprim  (BACTRIM  DS) 800-160 MG tablet Take 1 tablet by mouth 2 (two) times daily.   topiramate  (TOPAMAX ) 25 MG tablet Take by mouth.    Current Facility-Administered Medications (Other)  Medication Route   diphenhydrAMINE  (BENADRYL ) injection 12.5 mg Intramuscular   REVIEW OF SYSTEMS: ROS   Positive for: Endocrine, Eyes, Respiratory Negative for: Constitutional, Gastrointestinal, Neurological, Skin, Genitourinary, Musculoskeletal, HENT, Cardiovascular, Psychiatric, Allergic/Imm, Heme/Lymph Last edited by Alise Appl, COT on 03/09/2023  2:54 PM.      ALLERGIES Allergies  Allergen Reactions   Bee Venom Anaphylaxis and Swelling   Wasp Venom Anaphylaxis and Swelling   Citrus Hives and Itching   Latex Hives and Swelling   Penicillins Hives and Itching    Has patient had a PCN reaction causing immediate rash, facial/tongue/throat swelling, SOB or lightheadedness with hypotension: Yes Has patient had a PCN reaction causing severe rash involving mucus membranes or skin necrosis: Unk Has patient had a PCN reaction that required hospitalization: Was already in hosp Has patient had a PCN reaction occurring within the last 10 years: No If all of the above answers are "NO", then may proceed with Cephalosporin use.    Shellfish Allergy Other (See Comments) and Swelling    Patient is uncertain; stated that she tolerates this (??)   Soap Hives, Itching and Swelling    PUREX LAUNDRY DETERGENT   Tomato Hives and Itching   Banana Itching and Nausea Only   Chocolate Itching   Fluticasone  Other (See Comments)    Per patient, makes sneeze and gag   Hydrocodone  Itching and Nausea Only   Other Itching    Acidic foods- Itching [place]- Itching in the roof of the mouth   Adhesive [Tape] Itching and Rash    Allergic to  "leads"   Doxycycline Other (See Comments)    AFFECTS JOINTS   Liraglutide Nausea Only   Metformin  And Related Nausea Only, Rash, Other (See Comments) and Diarrhea    GI UPSET, also   Red Dye #40 (Allura Red) Itching, Rash and Other (See Comments)    At eye doctor's office, the dilating drops- Skin  breaks out, chest turned red, itching, face became swollen   PAST MEDICAL HISTORY 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   Cataract    NS OU   Complication of anesthesia    states is hard to wake up post-op   Constipation 12/30/2014   Diabetes mellitus    intolerant to Metformin    Diabetic retinopathy (HCC)    NPDR OU   Dry skin    hands    Family history of anesthesia complication    mother went into coma during c-section   Genital herpes 01/01/2006  on acyclovir  BID    GERD (gastroesophageal reflux disease)    Herpes genital    Hyperglycemia 12/26/2017   Hypertensive retinopathy    OU   Low back pain 11/22/2012   Lower extremity edema 10/18/2012   Menorrhagia 11/06/2015   Morbid obesity with BMI of 50 to 60 07/23/2009   Obesity hypoventilation syndrome (HCC) 10/30/2012   Obesity, morbid (HCC) 01/10/2014   OSA on CPAP     AHI was on  11-17-12 to 120/hr, titrated to 15 cm with 3 cm EPR/    PCOS (polycystic ovarian syndrome)    Rheumatoid arthritis (HCC)    Right ankle pain 06/19/2015   Right hip pain 09/04/2017   Right shoulder pain 06/01/2017   Seasonal allergies    Sleep apnea, obstructive    uses CPAP nightly - had sleep study 08/22/2007   Past Surgical History:  Procedure Laterality Date   CARPAL TUNNEL RELEASE Right 10/02/2012   Procedure: RIGHT CARPAL TUNNEL RELEASE ENDOSCOPIC;  Surgeon: Sheryl Donna, MD;  Location: [facility];  Service: Orthopedics;  Laterality: Right;   CHOLECYSTECTOMY N/A 12/02/2014   Procedure: LAPAROSCOPIC CHOLECYSTECTOMY;  Surgeon: Oralee Billow, MD;  Location: [facility];  Service: General;  Laterality: N/A;   HYSTEROSCOPY WITH D & C  01-08-2002   FAMILY HISTORY Family History  Problem  Relation Age of Onset   Other Mother    Diabetes Maternal Aunt    Heart attack Maternal Aunt    Alzheimer's disease Maternal Uncle    Cancer Maternal Uncle        prostate   Diabetes Paternal Aunt    Cancer Paternal Aunt        brain,mouth   Alzheimer's disease Paternal Aunt    Colon polyps Paternal Uncle    Diabetes Paternal Uncle    Diabetes Maternal Grandmother    Diabetes Maternal Grandfather    Leukemia Cousin    Other Other    Obesity Other    Other Other    Colitis Neg Hx    SOCIAL HISTORY Social History   Tobacco Use   Smoking status: Former    Current packs/day: 0.00    Types: Cigarettes    Start date: 05/23/2000    Quit date: 05/24/2010    Years since quitting: 12.8   Smokeless tobacco: Never  Vaping Use   Vaping status: Never Used  Substance Use Topics   Alcohol use: Yes    Alcohol/week: 0.0 standard drinks of alcohol    Comment: socially   Drug use: No       OPHTHALMIC EXAM: Base Eye Exam     Visual Acuity (Snellen - Linear)       Right Left   Dist cc 20/25 -2 20/25 -3   Dist ph cc 20/20 -2          Tonometry (Tonopen, 3:00 PM)       Right Left   Pressure 17 18         Pupils       Pupils Dark Light Shape React APD   Right PERRL 3 2 Round Brisk None   Left PERRL 3 2 Round Brisk None         Visual Fields       Left Right    Full Full         Extraocular Movement       Right Left    Full, Ortho Full, Ortho  Neuro/Psych     Oriented x3: Yes   Mood/Affect: Normal         Dilation     Both eyes: 2.5% Phenylephrine @ 3:00 PM           Slit Lamp and Fundus Exam     Slit Lamp Exam       Right Left   Lids/Lashes Dermatochalasis - upper lid, mild Meibomian gland dysfunction Dermatochalasis - upper lid, mild Meibomian gland dysfunction   Conjunctiva/Sclera White and quiet White and quiet   Cornea 2+Punctate epithelial erosions, mild tear film debris 3+fine Punctate epithelial erosions   Anterior  Chamber deep and quiet deep and quiet   Iris round and dilated, No NVI round and dilated   Lens 1+ NS, 1+CC 1+NS, 1+CC   Anterior Vitreous mild vitreous syneresis; PVD; mild vit condensations mild vitreous syneresis, scattered vitreous condensations, silicone oil micro bubbles trapped within vit         Fundus Exam       Right Left   Disc Pink and sharp, +cupping Pink and sharp, +cupping   C/D Ratio 0.7 0.6   Macula good foveal reflex, focal exudates ST macula- improved, scattered MA- improved, focal cystic changes ST macula- improved, focal laser changes inferior macula good foveal reflex, scatttered MA's and exudate, central cystic changes -- slightly increased, focal cluster of exudates SN macula -- persistent   Vessels attenuated, Tortuous attenuated, Tortuous   Periphery Attached, scattered MA/exudates greatest inferiorly, faint CWS nasal midzone Attached, scattered MA and exudate greatest superior to disc, white without pressure temporally           IMAGING AND PROCEDURES  Imaging and Procedures for @TODAY @  OCT, Retina - OU - Both Eyes       Right Eye Quality was good. Central Foveal Thickness: 225. Progression has improved. Findings include normal foveal contour, no SRF, intraretinal hyper-reflective material, intraretinal fluid, vitreomacular adhesion (Persistent IRF/cystic changes temporal fovea and macula -- slightly improved).   Left Eye Quality was good. Central Foveal Thickness: 332. Progression has worsened. Findings include no SRF, abnormal foveal contour, intraretinal hyper-reflective material, intraretinal fluid (Persistent IRF / IRHM SN fovea and mac -- slightly increased).   Notes *Images captured and stored on drive  Diagnosis / Impression:  +DME OU OD: Persistent IRF/cystic changes temporal fovea and macula -- slightly improved OS: Persistent IRF / IRHM SN fovea and mac  -- slightly increased  Clinical management:  See below  Abbreviations: NFP -  Normal foveal profile. CME - cystoid macular edema. PED - pigment epithelial detachment. IRF - intraretinal fluid. SRF - subretinal fluid. EZ - ellipsoid zone. ERM - epiretinal membrane. ORA - outer retinal atrophy. ORT - outer retinal tubulation. SRHM - subretinal hyper-reflective material      Intravitreal Injection, Pharmacologic Agent - OS - Left Eye       Time Out 03/09/2023. 3:45 PM. Confirmed correct patient, procedure, site, and patient consented.   Anesthesia Topical anesthesia was used. Anesthetic medications included Lidocaine  2%, Proparacaine 0.5%.   Procedure Preparation included 5% betadine to ocular surface, eyelid speculum. A (32g) needle was used.   Injection: 2 mg aflibercept  2 MG/0.05ML   Route: Intravitreal, Site: Left Eye   NDC: [id number], Lot: [id number], Expiration date: 12/23/2023, Waste: 0 mL   Post-op Post injection exam found visual acuity of at least counting fingers. The patient tolerated the procedure well. There were no complications. The patient received written and verbal post procedure care education. Post injection medications  were not given.            ASSESSMENT/PLAN:    ICD-10-CM   1. Moderate nonproliferative diabetic retinopathy of both eyes with macular edema associated with type 2 diabetes mellitus (HCC)  E11.3313 OCT, Retina - OU - Both Eyes    Intravitreal Injection, Pharmacologic Agent - OS - Left Eye    aflibercept  (EYLEA ) SOLN 2 mg    2. Current use of insulin  (HCC)  Z79.4     3. Long term (current) use of oral hypoglycemic drugs  Z79.84     4. Essential hypertension  I10     5. Hypertensive retinopathy of both eyes  H35.033     6. Nuclear sclerosis of both eyes  H25.13     7. Dry eyes  H04.123      1-3.  Moderate non-proliferative diabetic retinopathy w/ DME, OU  - delayed f/u -- 11.5 wks instead of 4 (10.25.24 to 01.15.25)  - A1c 6.6 (11.13.24), 7.7 (08.07.24), 9.0 (05.01.24)  - pt lost to f/u from 10.23.2019 to  11.18.20  - FA in 2019 showed leaking MA OU; no NV OU **of note, pt had pruritic rxn to fluorescein  dye -- resolved with 12.5 mg IV benadryl **  - FA 12.16.2020 shows late leaking MA's, non-perfusion defects; no NV OU - s/p IVA OD #1 (12.16.20), #2 (01.25.21), #3 (05.26.21), #4 (07.02.21), #5 (10.15.21), #6 (11.23.21), #7 (07.27.22), #8 (09.09.22), #9 (11.11.22), #10 (01.20.23), #11 (02.23.23), #12 (04.09.23), #13 (05.23.23), #14 (06.30.23), #15 (08.04.23), #16 (09.01.23), #17 (10.06.23), #18 (11.10.23) #19 (12.15.23), #20 (01.19.24), #21 (02.22.24), #22 (04.09.24), #23 (06.28.24), #24 (07.29.24), #25 (09.27.24), #26 (10.25.24) - s/p IVA OS #1 (12.23.20), #2 (01.27.21), #3 (03.02.21), #4 (05.26.21), #5 (07.02.21), #6 (10.15.21), #7 (11.23.21), #8 (01.11.22), #9 (03.04.22), #10 (04.27.22), #11 (11.14.22), #12 (01.10.23), #13 (02.20.23), #14 (04.05.23), #15 (05.19.23), #16 (06.23.23), #17 (07.28.23), #18 (09.01.23), #19 (10.06.23), #20 (11.10.23) #21 (12.15.23), #22 (01.19.24), #23 (02.22.24), #24 (04.01.24), #25 (04.30.24), #26 (05.28.24), #27 (06.28.24) -- IVA Resistance  - s/p focal laser OD (03.19.21), (04.01.22) - s/p IVE OS #1 (07.29.24), #2 (08.30.24), #3 (09.24.24), #4 (10.25.24)  - exam shows scattered MA, IRH, exudates and mild macular edema OU  - BCVA 20/20 OD, 20/25 OS - OCT shows OD: Persistent IRF/cystic changes temporal fovea and macula -- slightly improved; OS: Persistent IRF SN fovea and mac  -- slightly increased at 11 wks - Recommend IVE OS #5 today, 01.15.25 w/ f/u in 4-6 wks - Recommend holding IVA OD -- pt in agreement   - pt wishes to proceed with injection in the left eye - RBA of procedure discussed, questions answered - see procedure note  - Avastin  informed consent form re-signed and scanned on 05.19.23  - Eylea  informed consent form was signed and scanned on 07.29.24 (OU)  - f/u 4-6 weeks, DFE, OCT, possible injection(s)  4,5. Hypertensive retinopathy OU  - discussed  importance of tight BP control             - continue to monitor   6. Nuclear Sclerosis  - The symptoms of cataract, surgical options, and treatments and risks were discussed with patient.   - discussed diagnosis and progression  - under the expert management of [facility]   7. Dry eyes OU - recommend artificial tears and lubricating ointment as needed  Return in about 6 weeks (around 04/20/2023) for f/u NPDR OU , DFE, OCT, Possible, IVA, OD, IVE, OS.   Ophthalmic Meds Ordered this visit:  Meds ordered  this encounter  Medications   aflibercept  (EYLEA ) SOLN 2 mg    This document serves as a record of services personally performed by Jeanice Millard, MD, PhD. It was created on their behalf by Morley Arabia. Bevin Bucks, OA an ophthalmic technician. The creation of this record is the provider's dictation and/or activities during the visit.    Electronically signed by: Morley Arabia. Bevin Bucks, OA 03/09/23 4:54 PM  This document serves as a record of services personally performed by Jeanice Millard, MD, PhD. It was created on their behalf by Olene Berne, COT an ophthalmic technician. The creation of this record is the provider's dictation and/or activities during the visit.    Electronically signed by:  Olene Berne, COT  03/09/23 4:54 PM  Jeanice Millard, M.D., Ph.D. Diseases & Surgery of the Retina and Vitreous [facility] 03/09/2023   I have reviewed the above documentation for accuracy and completeness, and I agree with the above. Jeanice Millard, M.D., Ph.D. 03/09/23 4:56 PM    Abbreviations: M myopia (nearsighted); A astigmatism; H hyperopia (farsighted); P presbyopia; Mrx spectacle prescription;  CTL contact lenses; OD right eye; OS left eye; OU both eyes  XT exotropia; ET esotropia; PEK punctate epithelial keratitis; PEE punctate epithelial erosions; DES dry eye syndrome; MGD meibomian gland dysfunction; ATs artificial tears; PFAT's preservative free  artificial tears; NSC nuclear sclerotic cataract; PSC posterior subcapsular cataract; ERM epi-retinal membrane; PVD posterior vitreous detachment; RD retinal detachment; DM diabetes mellitus; DR diabetic retinopathy; NPDR non-proliferative diabetic retinopathy; PDR proliferative diabetic retinopathy; CSME clinically significant macular edema; DME diabetic macular edema; dbh dot blot hemorrhages; CWS cotton wool spot; POAG primary open angle glaucoma; C/D cup-to-disc ratio; HVF humphrey visual field; GVF goldmann visual field; OCT optical coherence tomography; IOP intraocular pressure; BRVO Branch retinal vein occlusion; CRVO central retinal vein occlusion; CRAO central retinal artery occlusion; BRAO branch retinal artery occlusion; RT retinal tear; SB scleral buckle; PPV pars plana vitrectomy; VH Vitreous hemorrhage; PRP panretinal laser photocoagulation; IVK intravitreal kenalog ; VMT vitreomacular traction; MH Macular hole;  NVD neovascularization of the disc; NVE neovascularization elsewhere; AREDS age related eye disease study; ARMD age related macular degeneration; POAG primary open angle glaucoma; EBMD epithelial/anterior basement membrane dystrophy; ACIOL anterior chamber intraocular lens; IOL intraocular lens; PCIOL posterior chamber intraocular lens; Phaco/IOL phacoemulsification with intraocular lens placement; PRK photorefractive keratectomy; LASIK laser assisted in situ keratomileusis; HTN hypertension; DM diabetes mellitus; COPD chronic obstructive pulmonary disease

## 2023-03-09 ENCOUNTER — Encounter (INDEPENDENT_AMBULATORY_CARE_PROVIDER_SITE_OTHER): Payer: Self-pay | Admitting: Ophthalmology

## 2023-03-09 ENCOUNTER — Ambulatory Visit (INDEPENDENT_AMBULATORY_CARE_PROVIDER_SITE_OTHER): Payer: Medicaid Other | Admitting: Ophthalmology

## 2023-03-09 DIAGNOSIS — H04123 Dry eye syndrome of bilateral lacrimal glands: Secondary | ICD-10-CM

## 2023-03-09 DIAGNOSIS — H2513 Age-related nuclear cataract, bilateral: Secondary | ICD-10-CM

## 2023-03-09 DIAGNOSIS — Z7984 Long term (current) use of oral hypoglycemic drugs: Secondary | ICD-10-CM

## 2023-03-09 DIAGNOSIS — E113313 Type 2 diabetes mellitus with moderate nonproliferative diabetic retinopathy with macular edema, bilateral: Secondary | ICD-10-CM | POA: Diagnosis not present

## 2023-03-09 DIAGNOSIS — Z794 Long term (current) use of insulin: Secondary | ICD-10-CM | POA: Diagnosis not present

## 2023-03-09 DIAGNOSIS — I10 Essential (primary) hypertension: Secondary | ICD-10-CM | POA: Diagnosis not present

## 2023-03-09 DIAGNOSIS — H35033 Hypertensive retinopathy, bilateral: Secondary | ICD-10-CM

## 2023-03-09 MED ORDER — AFLIBERCEPT 2MG/0.05ML IZ SOLN FOR KALEIDOSCOPE
2.0000 mg | INTRAVITREAL | Status: AC | PRN
  Administered 2023-03-09: 2 mg via INTRAVITREAL

## 2023-04-19 NOTE — Progress Notes (Signed)
 [facility] - Clinic Note  04/20/2023     CHIEF COMPLAINT Patient presents for Retina Follow Up  HISTORY OF PRESENT ILLNESS: Ana Long is a 48 y.o. female who presents to the clinic today for:   HPI     Retina Follow Up   Patient presents with  Diabetic Retinopathy.  In both eyes.  This started 6 weeks ago.  I, the attending physician,  performed the HPI with the patient and updated documentation appropriately.        Comments   Patient here for 6 weeks retina follow up for NPDR OU. Patient states vision is OK. Pretty decent. Has new floatys. No eye pain. Has a lot of running, crusty and slight itching.       Last edited by Rennis Chris, MD on 04/20/2023  5:49 PM.    Patient states is stable, has new floaters in the left eye  Referring physician: Olivia Canter MD [address] [place], [place] [address]  HISTORICAL INFORMATION:   Selected notes from the MEDICAL RECORD NUMBER Re-referred by Dr. Fabian Sharp for DM exam   CURRENT MEDICATIONS: Current Outpatient Medications (Ophthalmic Drugs)  Medication Sig   Olopatadine HCl 0.2 % SOLN Place one drop into both eyes daily.   No current facility-administered medications for this visit. (Ophthalmic Drugs)   Current Outpatient Medications (Other)  Medication Sig   acyclovir (ZOVIRAX) 400 MG tablet TAKE 1 TABLET BY MOUTH TWICE A DAY   albuterol (PROAIR HFA) 108 (90 Base) MCG/ACT inhaler Inhale 2 puffs into the lungs every 4 (four) hours as needed for wheezing or shortness of breath (cough). (Patient taking differently: Inhale 2 puffs into the lungs every 4 (four) hours as needed for wheezing or shortness of breath (or coughing).)   atorvastatin (LIPITOR) 10 MG tablet Take 10 mg by mouth daily.   benzonatate (TESSALON) 200 MG capsule Take 1 capsule (200 mg total) by mouth 3 (three) times daily as needed for cough.   Blood Glucose Monitoring Suppl (ACCU-CHEK AVIVA PLUS) w/Device KIT Please check  you blood sugars 4 times a day   CIPRODEX OTIC suspension Place into the right ear.   cyclobenzaprine (FLEXERIL) 10 MG tablet Take 1 tablet (10 mg total) by mouth 3 (three) times daily as needed for muscle spasms.   DERMA-SMOOTHE/FS SCALP 0.01 % OIL Apply topically.   diclofenac (VOLTAREN) 75 MG EC tablet Take 1 tablet (75 mg total) by mouth 2 (two) times daily.   fluconazole (DIFLUCAN) 150 MG tablet TAKE 1 TABLET BY MOUTH NOW. THEN REPEAT IN 1 WEEK   furosemide (LASIX) 20 MG tablet Take 20 mg by mouth daily as needed for fluid or edema.   glucose blood (ACCU-CHEK SMARTVIEW) test strip TEST FOUR TIMES DAILY   HUMULIN R U-500 KWIKPEN 500 UNIT/ML kwikpen Inject into the skin.   hydrochlorothiazide (HYDRODIURIL) 12.5 MG tablet Take by mouth.   Insulin Pen Needle (B-D UF III MINI PEN NEEDLES) 31G X 5 MM MISC USE AS DIRECTED TO INJECT INSULIN FIVE A DAY E11.65   lactulose (CEPHULAC) 10 g packet Take 1 packet (10 g total) by mouth daily as needed. Reported on 07/29/2015 (Patient taking differently: Take 10 g by mouth daily as needed (for constipation).)   Lancets (ACCU-CHEK SOFT TOUCH) lancets Use as instructed   loratadine (CLARITIN) 10 MG tablet Take 1 tablet (10 mg total) by mouth 2 (two) times daily.   mometasone-formoterol (DULERA) 200-5 MCG/ACT AERO Inhale 2 puffs into  the lungs 2 (two) times daily.   montelukast (SINGULAIR) 10 MG tablet Take 1 tablet (10 mg total) by mouth at bedtime.   nitroGLYCERIN (NITRODUR - DOSED IN MG/24 HR) 0.2 mg/hr patch Apply 1/4th patch to affected achilles, change daily   omeprazole (PRILOSEC) 20 MG capsule Take 1 capsule (20 mg total) by mouth daily before breakfast.   SPIRIVA RESPIMAT 1.25 MCG/ACT AERS SMARTSIG:2 Puff(s) Via Inhaler Daily   sulfamethoxazole-trimethoprim (BACTRIM DS) 800-160 MG tablet Take 1 tablet by mouth 2 (two) times daily.   topiramate (TOPAMAX) 25 MG tablet Take by mouth.   Current Facility-Administered Medications (Other)  Medication Route    diphenhydrAMINE (BENADRYL) injection 12.5 mg Intramuscular   REVIEW OF SYSTEMS: ROS   Positive for: Endocrine, Eyes, Respiratory Negative for: Constitutional, Gastrointestinal, Neurological, Skin, Genitourinary, Musculoskeletal, HENT, Cardiovascular, Psychiatric, Allergic/Imm, Heme/Lymph Last edited by Laddie Aquas, COA on 04/20/2023  2:45 PM.       ALLERGIES Allergies  Allergen Reactions   Bee Venom Anaphylaxis and Swelling   Wasp Venom Anaphylaxis and Swelling   Citrus Hives and Itching   Latex Hives and Swelling   Penicillins Hives and Itching    Has patient had a PCN reaction causing immediate rash, facial/tongue/throat swelling, SOB or lightheadedness with hypotension: Yes Has patient had a PCN reaction causing severe rash involving mucus membranes or skin necrosis: Unk Has patient had a PCN reaction that required hospitalization: Was already in hosp Has patient had a PCN reaction occurring within the last 10 years: No If all of the above answers are "NO", then may proceed with Cephalosporin use.    Shellfish Allergy Other (See Comments) and Swelling    Patient is uncertain; stated that she tolerates this (??)   Soap Hives, Itching and Swelling    PUREX LAUNDRY DETERGENT   Tomato Hives and Itching   Banana Itching and Nausea Only   Chocolate Itching   Fluticasone Other (See Comments)    Per patient, makes sneeze and gag   Hydrocodone Itching and Nausea Only   Other Itching    Acidic foods- Itching [place]- Itching in the roof of the mouth   Adhesive [Tape] Itching and Rash    Allergic to  "leads"   Doxycycline Other (See Comments)    AFFECTS JOINTS   Liraglutide Nausea Only   Metformin And Related Nausea Only, Rash, Other (See Comments) and Diarrhea    GI UPSET, also   Red Dye #40 (Allura Red) Itching, Rash and Other (See Comments)    At eye doctor's office, the dilating drops- Skin breaks out, chest turned red, itching, face became swollen   PAST MEDICAL  HISTORY 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   Cataract    NS OU   Complication of anesthesia    states is hard to wake up post-op   Constipation 12/30/2014   Diabetes mellitus    intolerant to Metformin   Diabetic retinopathy (HCC)    NPDR OU   Dry skin    hands    Family history of anesthesia complication    mother went into coma during c-section   Genital herpes 01/01/2006   on acyclovir BID    GERD (gastroesophageal reflux disease)  Herpes genital    Hyperglycemia 12/26/2017   Hypertensive retinopathy    OU   Low back pain 11/22/2012   Lower extremity edema 10/18/2012   Menorrhagia 11/06/2015   Morbid obesity with BMI of 50 to 60 07/23/2009   Obesity hypoventilation syndrome (HCC) 10/30/2012   Obesity, morbid (HCC) 01/10/2014   OSA on CPAP     AHI was on  11-17-12 to 120/hr, titrated to 15 cm with 3 cm EPR/    PCOS (polycystic ovarian syndrome)    Rheumatoid arthritis (HCC)    Right ankle pain 06/19/2015   Right hip pain 09/04/2017   Right shoulder pain 06/01/2017   Seasonal allergies    Sleep apnea, obstructive    uses CPAP nightly - had sleep study 08/22/2007   Past Surgical History:  Procedure Laterality Date   CARPAL TUNNEL RELEASE Right 10/02/2012   Procedure: RIGHT CARPAL TUNNEL RELEASE ENDOSCOPIC;  Surgeon: Jodi Marble, MD;  Location: [facility];  Service: Orthopedics;  Laterality: Right;   CHOLECYSTECTOMY N/A 12/02/2014   Procedure: LAPAROSCOPIC CHOLECYSTECTOMY;  Surgeon: Darnell Level, MD;  Location: [facility];  Service: General;  Laterality: N/A;   HYSTEROSCOPY WITH D & C  01-08-2002   FAMILY HISTORY Family History  Problem Relation Age of Onset   Other Mother    Diabetes Maternal Aunt    Heart  attack Maternal Aunt    Alzheimer's disease Maternal Uncle    Cancer Maternal Uncle        prostate   Diabetes Paternal Aunt    Cancer Paternal Aunt        brain,mouth   Alzheimer's disease Paternal Aunt    Colon polyps Paternal Uncle    Diabetes Paternal Uncle    Diabetes Maternal Grandmother    Diabetes Maternal Grandfather    Leukemia Cousin    Other Other    Obesity Other    Other Other    Colitis Neg Hx    SOCIAL HISTORY Social History   Tobacco Use   Smoking status: Former    Current packs/day: 0.00    Types: Cigarettes    Start date: 05/23/2000    Quit date: 05/24/2010    Years since quitting: 12.9   Smokeless tobacco: Never  Vaping Use   Vaping status: Never Used  Substance Use Topics   Alcohol use: Yes    Alcohol/week: 0.0 standard drinks of alcohol    Comment: socially   Drug use: No       OPHTHALMIC EXAM: Base Eye Exam     Visual Acuity (Snellen - Linear)       Right Left   Dist cc 20/20 -2 20/25 -1   Dist ph cc  20/20 -1         Tonometry (Tonopen, 2:43 PM)       Right Left   Pressure 15 17         Pupils       Dark Light Shape React APD   Right 3 2 Round Brisk None   Left 3 2 Round Brisk None         Visual Fields (Counting fingers)       Left Right    Full Full         Extraocular Movement       Right Left    Full, Ortho Full, Ortho         Neuro/Psych     Oriented x3: Yes   Mood/Affect: Normal  Dilation     Both eyes: 1.0% Mydriacyl, 2.5% Phenylephrine @ 2:43 PM           Slit Lamp and Fundus Exam     Slit Lamp Exam       Right Left   Lids/Lashes Dermatochalasis - upper lid, mild Meibomian gland dysfunction Dermatochalasis - upper lid, mild Meibomian gland dysfunction   Conjunctiva/Sclera White and quiet White and quiet   Cornea 2+Punctate epithelial erosions, mild tear film debris 3+fine Punctate epithelial erosions   Anterior Chamber deep and quiet deep and quiet   Iris round and dilated,  No NVI round and dilated   Lens 1+ NS, 1+CC 1+NS, 1+CC   Anterior Vitreous mild vitreous syneresis; PVD; mild vit condensations mild vitreous syneresis, scattered vitreous condensations, silicone oil micro bubbles trapped within vit         Fundus Exam       Right Left   Disc Pink and sharp, +cupping Pink and sharp, +cupping   C/D Ratio 0.7 0.6   Macula good foveal reflex, focal exudates ST macula- improved, scattered MA- improved, focal cystic changes ST macula- improved, focal laser changes inferior macula good foveal reflex, scatttered MA's and exudate, central cystic changes -- persistent, focal cluster of exudates SN macula -- persistent   Vessels attenuated, Tortuous attenuated, Tortuous   Periphery Attached, scattered MA/exudates greatest inferiorly, faint CWS nasal midzone Attached, scattered MA and exudate greatest superior to disc, white without pressure temporally           Refraction     Wearing Rx       Sphere Cylinder Axis   Right -4.25 +0.75 021   Left -4.25 +1.00 042    Type: SVL           IMAGING AND PROCEDURES  Imaging and Procedures for @TODAY @  OCT, Retina - OU - Both Eyes       Right Eye Quality was good. Central Foveal Thickness: 230. Progression has been stable. Findings include normal foveal contour, no IRF, no SRF, intraretinal hyper-reflective material, vitreomacular adhesion (Stable improvement in IRF/cystic changes temporal fovea and macula, scattered punctate IRHM -- improved).   Left Eye Quality was good. Central Foveal Thickness: 328. Progression has been stable. Findings include no SRF, abnormal foveal contour, intraretinal hyper-reflective material, intraretinal fluid (Persistent IRF / IRHM SN fovea and mac -- slightly improved).   Notes *Images captured and stored on drive  Diagnosis / Impression:  +DME OU OD: Stable improvement in IRF/cystic changes temporal fovea and macula, scattered punctate IRHM -- improved OS: Persistent  IRF / IRHM SN fovea and mac  -- slightly improved  Clinical management:  See below  Abbreviations: NFP - Normal foveal profile. CME - cystoid macular edema. PED - pigment epithelial detachment. IRF - intraretinal fluid. SRF - subretinal fluid. EZ - ellipsoid zone. ERM - epiretinal membrane. ORA - outer retinal atrophy. ORT - outer retinal tubulation. SRHM - subretinal hyper-reflective material      Intravitreal Injection, Pharmacologic Agent - OS - Left Eye       Time Out 04/20/2023. 3:34 PM. Confirmed correct patient, procedure, site, and patient consented.   Anesthesia Topical anesthesia was used. Anesthetic medications included Lidocaine 2%, Proparacaine 0.5%.   Procedure Preparation included 5% betadine to ocular surface, eyelid speculum. A (32g) needle was used.   Injection: 6 mg faricimab-svoa 6 MG/0.05ML   Route: Intravitreal, Site: Left Eye   NDC: [id number], Lot: [id number], Expiration date: 06/21/2024, Waste: 0 mL   Post-op  Post injection exam found visual acuity of at least counting fingers. The patient tolerated the procedure well. There were no complications. The patient received written and verbal post procedure care education. Post injection medications were not given.            ASSESSMENT/PLAN:    ICD-10-CM   1. Moderate nonproliferative diabetic retinopathy of both eyes with macular edema associated with type 2 diabetes mellitus (HCC)  E11.3313 OCT, Retina - OU - Both Eyes    Intravitreal Injection, Pharmacologic Agent - OS - Left Eye    faricimab-svoa (VABYSMO) 6mg /0.70mL intravitreal injection    2. Current use of insulin (HCC)  Z79.4     3. Long term (current) use of oral hypoglycemic drugs  Z79.84     4. Essential hypertension  I10     5. Hypertensive retinopathy of both eyes  H35.033     6. Nuclear sclerosis of both eyes  H25.13     7. Dry eyes  H04.123      1-3.  Moderate non-proliferative diabetic retinopathy w/ DME, OU  - delayed f/u --  11.5 wks instead of 4 (10.25.24 to 01.15.25)  - A1c 6.6 (11.13.24), 7.7 (08.07.24), 9.0 (05.01.24)  - pt lost to f/u from 10.23.2019 to 11.18.20  - FA in 2019 showed leaking MA OU; no NV OU **of note, pt had pruritic rxn to fluorescein dye -- resolved with 12.5 mg IV benadryl**  - FA 12.16.2020 shows late leaking MA's, non-perfusion defects; no NV OU - s/p IVA OD #1 (12.16.20), #2 (01.25.21), #3 (05.26.21), #4 (07.02.21), #5 (10.15.21), #6 (11.23.21), #7 (07.27.22), #8 (09.09.22), #9 (11.11.22), #10 (01.20.23), #11 (02.23.23), #12 (04.09.23), #13 (05.23.23), #14 (06.30.23), #15 (08.04.23), #16 (09.01.23), #17 (10.06.23), #18 (11.10.23) #19 (12.15.23), #20 (01.19.24), #21 (02.22.24), #22 (04.09.24), #23 (06.28.24), #24 (07.29.24), #25 (09.27.24), #26 (10.25.24) - s/p IVA OS #1 (12.23.20), #2 (01.27.21), #3 (03.02.21), #4 (05.26.21), #5 (07.02.21), #6 (10.15.21), #7 (11.23.21), #8 (01.11.22), #9 (03.04.22), #10 (04.27.22), #11 (11.14.22), #12 (01.10.23), #13 (02.20.23), #14 (04.05.23), #15 (05.19.23), #16 (06.23.23), #17 (07.28.23), #18 (09.01.23), #19 (10.06.23), #20 (11.10.23) #21 (12.15.23), #22 (01.19.24), #23 (02.22.24), #24 (04.01.24), #25 (04.30.24), #26 (05.28.24), #27 (06.28.24) -- IVA Resistance  - s/p focal laser OD (03.19.21), (04.01.22) - s/p IVE OS #1 (07.29.24), #2 (08.30.24), #3 (09.24.24), #4 (10.25.24), #5 (01.15.25)  - exam shows scattered MA, IRH, exudates and mild macular edema OU  - BCVA OD 20/20, OS 20/20 from 20/25 - OCT shows OD: Stable improvement in IRF/cystic changes temporal fovea and macula, scattered punctate IRHM -- improved; OS: Persistent IRF / IRHM SN fovea and mac  -- slightly improved at 6 wks **discussed decreased efficacy / resistance to Eylea and potential benefit of switching medication* - Recommend switching to IVV OS #1 today, 02.26.25 w/ f/u in 4 wks - Recommend holding IVA OD -- pt in agreement   - pt wishes to proceed with injection in the left eye - RBA  of procedure discussed, questions answered - see procedure note  - [facility] informed consent form was signed and scanned on 02.26.25 (OU)  - f/u 4 weeks, DFE, OCT, possible injection(s)  4,5. Hypertensive retinopathy OU  - discussed importance of tight BP control             - continue to monitor   6. Nuclear Sclerosis  - The symptoms of cataract, surgical options, and treatments and risks were discussed with patient.   - discussed diagnosis and progression  - under the expert management of  [facility]   7. Dry eyes OU - recommend artificial tears and lubricating ointment as needed  Return in about 4 weeks (around 05/18/2023) for NPDR OU, DFE, OCT, possible injxn.   Ophthalmic Meds Ordered this visit:  Meds ordered this encounter  Medications   faricimab-svoa (VABYSMO) 6mg /0.60mL intravitreal injection    This document serves as a record of services personally performed by Karie Chimera, MD, PhD. It was created on their behalf by Glee Arvin. Manson Passey, OA an ophthalmic technician. The creation of this record is the provider's dictation and/or activities during the visit.    Electronically signed by: Glee Arvin. Manson Passey, OA 04/20/23 5:51 PM  Karie Chimera, M.D., Ph.D. Diseases & Surgery of the Retina and Vitreous [facility] 04/20/2023   I have reviewed the above documentation for accuracy and completeness, and I agree with the above. Karie Chimera, M.D., Ph.D. 04/20/23 5:52 PM   Abbreviations: M myopia (nearsighted); A astigmatism; H hyperopia (farsighted); P presbyopia; Mrx spectacle prescription;  CTL contact lenses; OD right eye; OS left eye; OU both eyes  XT exotropia; ET esotropia; PEK punctate epithelial keratitis; PEE punctate epithelial erosions; DES dry eye syndrome; MGD meibomian gland dysfunction; ATs artificial tears; PFAT's preservative free artificial tears; NSC nuclear sclerotic cataract; PSC posterior subcapsular cataract; ERM epi-retinal  membrane; PVD posterior vitreous detachment; RD retinal detachment; DM diabetes mellitus; DR diabetic retinopathy; NPDR non-proliferative diabetic retinopathy; PDR proliferative diabetic retinopathy; CSME clinically significant macular edema; DME diabetic macular edema; dbh dot blot hemorrhages; CWS cotton wool spot; POAG primary open angle glaucoma; C/D cup-to-disc ratio; HVF humphrey visual field; GVF goldmann visual field; OCT optical coherence tomography; IOP intraocular pressure; BRVO Branch retinal vein occlusion; CRVO central retinal vein occlusion; CRAO central retinal artery occlusion; BRAO branch retinal artery occlusion; RT retinal tear; SB scleral buckle; PPV pars plana vitrectomy; VH Vitreous hemorrhage; PRP panretinal laser photocoagulation; IVK intravitreal kenalog; VMT vitreomacular traction; MH Macular hole;  NVD neovascularization of the disc; NVE neovascularization elsewhere; AREDS age related eye disease study; ARMD age related macular degeneration; POAG primary open angle glaucoma; EBMD epithelial/anterior basement membrane dystrophy; ACIOL anterior chamber intraocular lens; IOL intraocular lens; PCIOL posterior chamber intraocular lens; Phaco/IOL phacoemulsification with intraocular lens placement; PRK photorefractive keratectomy; LASIK laser assisted in situ keratomileusis; HTN hypertension; DM diabetes mellitus; COPD chronic obstructive pulmonary disease

## 2023-04-20 ENCOUNTER — Ambulatory Visit (INDEPENDENT_AMBULATORY_CARE_PROVIDER_SITE_OTHER): Payer: Medicaid Other | Admitting: Ophthalmology

## 2023-04-20 ENCOUNTER — Encounter (INDEPENDENT_AMBULATORY_CARE_PROVIDER_SITE_OTHER): Payer: Self-pay | Admitting: Ophthalmology

## 2023-04-20 DIAGNOSIS — I10 Essential (primary) hypertension: Secondary | ICD-10-CM | POA: Diagnosis not present

## 2023-04-20 DIAGNOSIS — H04123 Dry eye syndrome of bilateral lacrimal glands: Secondary | ICD-10-CM

## 2023-04-20 DIAGNOSIS — Z794 Long term (current) use of insulin: Secondary | ICD-10-CM

## 2023-04-20 DIAGNOSIS — E113313 Type 2 diabetes mellitus with moderate nonproliferative diabetic retinopathy with macular edema, bilateral: Secondary | ICD-10-CM

## 2023-04-20 DIAGNOSIS — Z7984 Long term (current) use of oral hypoglycemic drugs: Secondary | ICD-10-CM | POA: Diagnosis not present

## 2023-04-20 DIAGNOSIS — H35033 Hypertensive retinopathy, bilateral: Secondary | ICD-10-CM

## 2023-04-20 DIAGNOSIS — H2513 Age-related nuclear cataract, bilateral: Secondary | ICD-10-CM

## 2023-04-20 MED ORDER — FARICIMAB-SVOA 6 MG/0.05ML IZ SOSY
6.0000 mg | PREFILLED_SYRINGE | INTRAVITREAL | Status: AC | PRN
  Administered 2023-04-20: 6 mg via INTRAVITREAL

## 2023-05-04 ENCOUNTER — Ambulatory Visit: Admitting: Family Medicine

## 2023-05-04 ENCOUNTER — Other Ambulatory Visit: Payer: Self-pay

## 2023-05-04 VITALS — BP 143/77 | Ht 62.0 in | Wt 300.0 lb

## 2023-05-04 DIAGNOSIS — M6530 Trigger finger, unspecified finger: Secondary | ICD-10-CM | POA: Diagnosis present

## 2023-05-04 DIAGNOSIS — G8929 Other chronic pain: Secondary | ICD-10-CM | POA: Diagnosis not present

## 2023-05-04 DIAGNOSIS — M25511 Pain in right shoulder: Secondary | ICD-10-CM | POA: Diagnosis not present

## 2023-05-04 MED ORDER — METHYLPREDNISOLONE ACETATE 40 MG/ML IJ SUSP
20.0000 mg | Freq: Once | INTRAMUSCULAR | Status: AC
  Administered 2023-05-04: 20 mg via INTRA_ARTICULAR

## 2023-05-04 NOTE — Patient Instructions (Signed)
 We will get records from ortho about your right shoulder - my recall is they didn't want to do surgery because your diabetes wasn't under control but now it is. And the conservative measures were not helping including injection, extensive physical therapy.  You have a trigger finger of your ring finger on your left hand. You were given an injection today.   Heat 15 minutes at a time as needed. Tylenol, topical medication if needed.

## 2023-05-05 ENCOUNTER — Encounter: Payer: Self-pay | Admitting: Family Medicine

## 2023-05-05 NOTE — Progress Notes (Signed)
 PCP: Macy Mis, MD  Subjective:   HPI: Patient is a 48 y.o. female here for right shoulder, left finger pain.  Patient reports over past several weeks she's had worsening pain, catching, locking of left ring finger. Feels similar to trigger finger she's had in other digits previously. Interested in injection. Continues to struggle with right shoulder pain despite extensive conservative treatment including injections and physical therapy.   Noted to have partial thickness tear supraspinatus (high grade), infraspinatus tendinosis with fraying, severe biceps tendinosis, superior labral tear, mild arthritis with area of focal full-thickness loss. She saw ortho previously but was not a surgical candidate due to her diabetes - most recent A1c now was 6.5.  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   Cataract    NS OU   Complication of anesthesia    states is hard to wake up post-op   Constipation 12/30/2014   Diabetes mellitus    intolerant to Metformin   Diabetic retinopathy (HCC)    NPDR OU   Dry skin    hands    Family history of anesthesia complication    mother went into coma during c-section   Genital herpes 01/01/2006   on acyclovir BID    GERD (gastroesophageal reflux disease)    Herpes genital    Hyperglycemia 12/26/2017   Hypertensive retinopathy    OU   Low back pain 11/22/2012   Lower extremity edema 10/18/2012   Menorrhagia 11/06/2015   Morbid obesity with BMI of 50 to 60 07/23/2009   Obesity hypoventilation syndrome (HCC) 10/30/2012   Obesity, morbid (HCC) 01/10/2014   OSA on CPAP     AHI was on  11-17-12 to 120/hr, titrated to 15 cm with 3 cm EPR/    PCOS (polycystic ovarian syndrome)    Rheumatoid arthritis (HCC)    Right  ankle pain 06/19/2015   Right hip pain 09/04/2017   Right shoulder pain 06/01/2017   Seasonal allergies    Sleep apnea, obstructive    uses CPAP nightly - had sleep study 08/22/2007    Current Outpatient Medications on File Prior to Visit  Medication Sig Dispense Refill   acyclovir (ZOVIRAX) 400 MG tablet TAKE 1 TABLET BY MOUTH TWICE A DAY 60 tablet 2   albuterol (PROAIR HFA) 108 (90 Base) MCG/ACT inhaler Inhale 2 puffs into the lungs every 4 (four) hours as needed for wheezing or shortness of breath (cough). (Patient taking differently: Inhale 2 puffs into the lungs every 4 (four) hours as needed for wheezing or shortness of breath (or coughing).) 6.7 g 2   atorvastatin (LIPITOR) 10 MG tablet Take 10 mg by mouth daily.     benzonatate (TESSALON) 200 MG capsule Take 1 capsule (200 mg total) by mouth 3 (three) times daily as needed for cough. 20 capsule 0   Blood Glucose Monitoring Suppl (ACCU-CHEK AVIVA PLUS) w/Device KIT Please check you blood sugars 4 times a day 1 kit 0   CIPRODEX OTIC suspension Place into the right ear.     cyclobenzaprine (FLEXERIL) 10 MG tablet Take 1 tablet (10 mg total) by mouth 3 (three) times daily as needed for muscle spasms. 60 tablet 1   DERMA-SMOOTHE/FS SCALP 0.01 % OIL Apply topically.  diclofenac (VOLTAREN) 75 MG EC tablet Take 1 tablet (75 mg total) by mouth 2 (two) times daily. 60 tablet 1   fluconazole (DIFLUCAN) 150 MG tablet TAKE 1 TABLET BY MOUTH NOW. THEN REPEAT IN 1 WEEK     furosemide (LASIX) 20 MG tablet Take 20 mg by mouth daily as needed for fluid or edema.     glucose blood (ACCU-CHEK SMARTVIEW) test strip TEST FOUR TIMES DAILY 150 each 6   HUMULIN R U-500 KWIKPEN 500 UNIT/ML kwikpen Inject into the skin.     hydrochlorothiazide (HYDRODIURIL) 12.5 MG tablet Take by mouth.     Insulin Pen Needle (B-D UF III MINI PEN NEEDLES) 31G X 5 MM MISC USE AS DIRECTED TO INJECT INSULIN FIVE A DAY E11.65 100 each 6   lactulose (CEPHULAC) 10 g packet Take 1  packet (10 g total) by mouth daily as needed. Reported on 07/29/2015 (Patient taking differently: Take 10 g by mouth daily as needed (for constipation).) 30 each 0   Lancets (ACCU-CHEK SOFT TOUCH) lancets Use as instructed 100 each 12   loratadine (CLARITIN) 10 MG tablet Take 1 tablet (10 mg total) by mouth 2 (two) times daily. 180 tablet 1   mometasone-formoterol (DULERA) 200-5 MCG/ACT AERO Inhale 2 puffs into the lungs 2 (two) times daily. 13 Inhaler 1   montelukast (SINGULAIR) 10 MG tablet Take 1 tablet (10 mg total) by mouth at bedtime. 90 tablet 1   nitroGLYCERIN (NITRODUR - DOSED IN MG/24 HR) 0.2 mg/hr patch Apply 1/4th patch to affected achilles, change daily 30 patch 1   Olopatadine HCl 0.2 % SOLN Place one drop into both eyes daily.     omeprazole (PRILOSEC) 20 MG capsule Take 1 capsule (20 mg total) by mouth daily before breakfast. 90 capsule 2   SPIRIVA RESPIMAT 1.25 MCG/ACT AERS SMARTSIG:2 Puff(s) Via Inhaler Daily     sulfamethoxazole-trimethoprim (BACTRIM DS) 800-160 MG tablet Take 1 tablet by mouth 2 (two) times daily. 20 tablet 0   topiramate (TOPAMAX) 25 MG tablet Take by mouth.     Current Facility-Administered Medications on File Prior to Visit  Medication Dose Route Frequency Provider Last Rate Last Admin   diphenhydrAMINE (BENADRYL) injection 12.5 mg  12.5 mg Intramuscular Once Rennis Chris, MD        Past Surgical History:  Procedure Laterality Date   CARPAL TUNNEL RELEASE Right 10/02/2012   Procedure: RIGHT CARPAL TUNNEL RELEASE ENDOSCOPIC;  Surgeon: Jodi Marble, MD;  [facility]  [facility];  Service: Orthopedics;  Laterality: Right;   CHOLECYSTECTOMY N/A 12/02/2014   Procedure: LAPAROSCOPIC CHOLECYSTECTOMY;  Surgeon: Darnell Level, MD;  Location: [facility];  Service: General;  Laterality: N/A;   HYSTEROSCOPY WITH D & C  01-08-2002    Allergies  Allergen Reactions   Bee Venom Anaphylaxis and Swelling   Wasp Venom Anaphylaxis and Swelling   Citrus Hives  and Itching   Latex Hives and Swelling   Penicillins Hives and Itching    Has patient had a PCN reaction causing immediate rash, facial/tongue/throat swelling, SOB or lightheadedness with hypotension: Yes Has patient had a PCN reaction causing severe rash involving mucus membranes or skin necrosis: Unk Has patient had a PCN reaction that required hospitalization: Was already in hosp Has patient had a PCN reaction occurring within the last 10 years: No If all of the above answers are "NO", then may proceed with Cephalosporin use.    Shellfish Allergy Other (See Comments) and Swelling    Patient is uncertain;  stated that she tolerates this (??)   Soap Hives, Itching and Swelling    PUREX LAUNDRY DETERGENT   Tomato Hives and Itching   Banana Itching and Nausea Only   Chocolate Itching   Fluticasone Other (See Comments)    Per patient, makes sneeze and gag   Hydrocodone Itching and Nausea Only   Other Itching    Acidic foods- Itching [place]- Itching in the roof of the mouth   Adhesive [Tape] Itching and Rash    Allergic to  "leads"   Doxycycline Other (See Comments)    AFFECTS JOINTS   Liraglutide Nausea Only   Metformin And Related Nausea Only, Rash, Other (See Comments) and Diarrhea    GI UPSET, also   Red Dye #40 (Allura Red) Itching, Rash and Other (See Comments)    At eye doctor's office, the dilating drops- Skin breaks out, chest turned red, itching, face became swollen    BP (!) 143/77   Ht 5\' 2"  (1.575 m)   Wt 300 lb (136.1 kg)   BMI 54.87 kg/m       No data to display              No data to display              Objective:  Physical Exam:  Gen: NAD, comfortable in exam room  Left hand: No deformity, swelling, bruising.  Nodule at A1 pulley area of left 4th digit. FROM with 5/5 strength. Tenderness to palpation A1 pulley area above. NVI distally.   Assessment & Plan:  1. Left 4th digit trigger finger - injection given today.  Heat, topical  voltaren gel if needed.    After informed written consent timeout was performed  Area overlying left 4th digit flexor tendon sheath in area of A1 pulley prepped with alcohol swabs then utilizing ultrasound guidance this was injected with 0.5:0.44mL lidocaine: depomedrol 40mg .  Patient tolerated procedure well without immediate complications.  2. Right shoulder pain - failed conservative treatment.  Known supraspinatus high grade partial thickness tear, infraspinatus tendinosis with fraying, severe biceps tendinosis, superior labral tear.  Will defer repeat imaging to ortho.  Follow back up with them.

## 2023-05-13 NOTE — Progress Notes (Signed)
 [facility] - Clinic Note  05/18/2023     CHIEF COMPLAINT Patient presents for Retina Follow Up  HISTORY OF PRESENT ILLNESS: Ana Long is a 48 y.o. female who presents to the clinic today for:   HPI     Retina Follow Up   Patient presents with  Diabetic Retinopathy.  In both eyes.  This started weeks ago.  Duration of 4 weeks.  Since onset it is stable.  I, the attending physician,  performed the HPI with the patient and updated documentation appropriately.        Comments   Patient here for 4 weeks retina follow up for NPDR OU. Patient states vision is OK. Pretty decent. Pt states she did notice some flashes in her left eye this morning, not sure if there were any new floaters. Pt denies any discomfort. Pt state eyes have been itching. Pt is not using any ATS. Pt states last A1c was 9.3 1 week ago. BS has not been monitored for 1 month.      Last edited by Rennis Chris, MD on 05/18/2023  5:48 PM.     Patient  Referring physician: Olivia Canter MD [address] [place], [place] [address]  HISTORICAL INFORMATION:   Selected notes from the MEDICAL RECORD NUMBER Re-referred by Dr. Fabian Sharp for DM exam   CURRENT MEDICATIONS: Current Outpatient Medications (Ophthalmic Drugs)  Medication Sig   Olopatadine HCl 0.2 % SOLN Place one drop into both eyes daily.   No current facility-administered medications for this visit. (Ophthalmic Drugs)   Current Outpatient Medications (Other)  Medication Sig   acyclovir (ZOVIRAX) 400 MG tablet TAKE 1 TABLET BY MOUTH TWICE A DAY   albuterol (PROAIR HFA) 108 (90 Base) MCG/ACT inhaler Inhale 2 puffs into the lungs every 4 (four) hours as needed for wheezing or shortness of breath (cough). (Patient taking differently: Inhale 2 puffs into the lungs every 4 (four) hours as needed for wheezing or shortness of breath (or coughing).)   atorvastatin (LIPITOR) 10 MG tablet Take 10 mg by mouth daily.   benzonatate  (TESSALON) 200 MG capsule Take 1 capsule (200 mg total) by mouth 3 (three) times daily as needed for cough.   CIPRODEX OTIC suspension Place into the right ear.   cyclobenzaprine (FLEXERIL) 10 MG tablet Take 1 tablet (10 mg total) by mouth 3 (three) times daily as needed for muscle spasms.   DERMA-SMOOTHE/FS SCALP 0.01 % OIL Apply topically.   diclofenac (VOLTAREN) 75 MG EC tablet Take 1 tablet (75 mg total) by mouth 2 (two) times daily.   fluconazole (DIFLUCAN) 150 MG tablet TAKE 1 TABLET BY MOUTH NOW. THEN REPEAT IN 1 WEEK   furosemide (LASIX) 20 MG tablet Take 20 mg by mouth daily as needed for fluid or edema.   HUMULIN R U-500 KWIKPEN 500 UNIT/ML kwikpen Inject into the skin.   hydrochlorothiazide (HYDRODIURIL) 12.5 MG tablet Take by mouth.   Insulin Pen Needle (B-D UF III MINI PEN NEEDLES) 31G X 5 MM MISC USE AS DIRECTED TO INJECT INSULIN FIVE A DAY E11.65   lactulose (CEPHULAC) 10 g packet Take 1 packet (10 g total) by mouth daily as needed. Reported on 07/29/2015 (Patient taking differently: Take 10 g by mouth daily as needed (for constipation).)   loratadine (CLARITIN) 10 MG tablet Take 1 tablet (10 mg total) by mouth 2 (two) times daily.   mometasone-formoterol (DULERA) 200-5 MCG/ACT AERO Inhale 2 puffs into the lungs 2 (  two) times daily.   montelukast (SINGULAIR) 10 MG tablet Take 1 tablet (10 mg total) by mouth at bedtime.   nitroGLYCERIN (NITRODUR - DOSED IN MG/24 HR) 0.2 mg/hr patch Apply 1/4th patch to affected achilles, change daily   omeprazole (PRILOSEC) 20 MG capsule Take 1 capsule (20 mg total) by mouth daily before breakfast.   SPIRIVA RESPIMAT 1.25 MCG/ACT AERS SMARTSIG:2 Puff(s) Via Inhaler Daily   sulfamethoxazole-trimethoprim (BACTRIM DS) 800-160 MG tablet Take 1 tablet by mouth 2 (two) times daily.   topiramate (TOPAMAX) 25 MG tablet Take by mouth.   Blood Glucose Monitoring Suppl (ACCU-CHEK AVIVA PLUS) w/Device KIT Please check you blood sugars 4 times a day (Patient not  taking: Reported on 05/18/2023)   glucose blood (ACCU-CHEK SMARTVIEW) test strip TEST FOUR TIMES DAILY (Patient not taking: Reported on 05/18/2023)   Lancets (ACCU-CHEK SOFT TOUCH) lancets Use as instructed (Patient not taking: Reported on 05/18/2023)   Current Facility-Administered Medications (Other)  Medication Route   diphenhydrAMINE (BENADRYL) injection 12.5 mg Intramuscular   REVIEW OF SYSTEMS: ROS   Positive for: Endocrine, Eyes, Respiratory Negative for: Constitutional, Gastrointestinal, Neurological, Skin, Genitourinary, Musculoskeletal, HENT, Cardiovascular, Psychiatric, Allergic/Imm, Heme/Lymph Last edited by Elicia Lamp, COT on 05/18/2023  2:04 PM.     ALLERGIES Allergies  Allergen Reactions   Bee Venom Anaphylaxis and Swelling   Wasp Venom Anaphylaxis and Swelling   Citrus Hives and Itching   Latex Hives and Swelling   Penicillins Hives and Itching    Has patient had a PCN reaction causing immediate rash, facial/tongue/throat swelling, SOB or lightheadedness with hypotension: Yes Has patient had a PCN reaction causing severe rash involving mucus membranes or skin necrosis: Unk Has patient had a PCN reaction that required hospitalization: Was already in hosp Has patient had a PCN reaction occurring within the last 10 years: No If all of the above answers are "NO", then may proceed with Cephalosporin use.    Shellfish Allergy Other (See Comments) and Swelling    Patient is uncertain; stated that she tolerates this (??)   Soap Hives, Itching and Swelling    PUREX LAUNDRY DETERGENT   Tomato Hives and Itching   Banana Itching and Nausea Only   Chocolate Itching   Fluticasone Other (See Comments)    Per patient, makes sneeze and gag   Hydrocodone Itching and Nausea Only   Other Itching    Acidic foods- Itching [place]- Itching in the roof of the mouth   Adhesive [Tape] Itching and Rash    Allergic to  "leads"   Doxycycline Other (See Comments)    AFFECTS JOINTS    Liraglutide Nausea Only   Metformin And Related Nausea Only, Rash, Other (See Comments) and Diarrhea    GI UPSET, also   Red Dye #40 (Allura Red) Itching, Rash and Other (See Comments)    At eye doctor's office, the dilating drops- Skin breaks out, chest turned red, itching, face became swollen   PAST MEDICAL HISTORY 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   Cataract    NS OU   Complication of anesthesia    states is hard to wake up post-op   Constipation 12/30/2014   Diabetes mellitus    intolerant to Metformin  Diabetic retinopathy (HCC)    NPDR OU   Dry skin    hands    Family history of anesthesia complication    mother went into coma during c-section   Genital herpes 01/01/2006   on acyclovir BID    GERD (gastroesophageal reflux disease)    Herpes genital    Hyperglycemia 12/26/2017   Hypertensive retinopathy    OU   Low back pain 11/22/2012   Lower extremity edema 10/18/2012   Menorrhagia 11/06/2015   Morbid obesity with BMI of 50 to 60 07/23/2009   Obesity hypoventilation syndrome (HCC) 10/30/2012   Obesity, morbid (HCC) 01/10/2014   OSA on CPAP     AHI was on  11-17-12 to 120/hr, titrated to 15 cm with 3 cm EPR/    PCOS (polycystic ovarian syndrome)    Rheumatoid arthritis (HCC)    Right ankle pain 06/19/2015   Right hip pain 09/04/2017   Right shoulder pain 06/01/2017   Seasonal allergies    Sleep apnea, obstructive    uses CPAP nightly - had sleep study 08/22/2007   Past Surgical History:  Procedure Laterality Date   CARPAL TUNNEL RELEASE Right 10/02/2012   Procedure: RIGHT CARPAL TUNNEL RELEASE ENDOSCOPIC;  Surgeon: Jodi Marble, MD;  Location: [facility];  Service: Orthopedics;  Laterality: Right;    CHOLECYSTECTOMY N/A 12/02/2014   Procedure: LAPAROSCOPIC CHOLECYSTECTOMY;  Surgeon: Darnell Level, MD;  Location: [facility];  Service: General;  Laterality: N/A;   HYSTEROSCOPY WITH D & C  01-08-2002   FAMILY HISTORY Family History  Problem Relation Age of Onset   Other Mother    Diabetes Maternal Aunt    Heart attack Maternal Aunt    Alzheimer's disease Maternal Uncle    Cancer Maternal Uncle        prostate   Diabetes Paternal Aunt    Cancer Paternal Aunt        brain,mouth   Alzheimer's disease Paternal Aunt    Colon polyps Paternal Uncle    Diabetes Paternal Uncle    Diabetes Maternal Grandmother    Diabetes Maternal Grandfather    Leukemia Cousin    Other Other    Obesity Other    Other Other    Colitis Neg Hx    SOCIAL HISTORY Social History   Tobacco Use   Smoking status: Former    Current packs/day: 0.00    Types: Cigarettes    Start date: 05/23/2000    Quit date: 05/24/2010    Years since quitting: 13.0   Smokeless tobacco: Never  Vaping Use   Vaping status: Never Used  Substance Use Topics   Alcohol use: Yes    Alcohol/week: 0.0 standard drinks of alcohol    Comment: socially   Drug use: No       OPHTHALMIC EXAM: Base Eye Exam     Visual Acuity (Snellen - Linear)       Right Left   Dist cc 20/20 -2 20/30 +1   Dist ph cc NI 20/25 -2    Correction: Glasses         Tonometry (Tonopen, 2:11 PM)       Right Left   Pressure 12 15         Pupils       Dark Light Shape React APD   Right 3 2 Round Brisk None   Left 3 2 Round Brisk None         Visual Fields  Left Right    Full Full         Extraocular Movement       Right Left    Full, Ortho Full, Ortho         Neuro/Psych     Oriented x3: Yes   Mood/Affect: Normal         Dilation     Both eyes: 1.0% Mydriacyl, 2.5% Phenylephrine @ 2:11 PM           Slit Lamp and Fundus Exam     Slit Lamp Exam       Right Left   Lids/Lashes Dermatochalasis - upper lid,  mild Meibomian gland dysfunction Dermatochalasis - upper lid, mild Meibomian gland dysfunction   Conjunctiva/Sclera White and quiet White and quiet   Cornea 2+Punctate epithelial erosions, mild tear film debris 3+fine Punctate epithelial erosions   Anterior Chamber deep and quiet deep and quiet   Iris round and dilated, No NVI round and dilated   Lens 1+ NS, 1+CC 1+NS, 1+CC   Anterior Vitreous mild vitreous syneresis; PVD; mild vit condensations mild vitreous syneresis, scattered vitreous condensations, silicone oil micro bubbles trapped within vit         Fundus Exam       Right Left   Disc Pink and sharp, +cupping Pink and sharp, +cupping   C/D Ratio 0.7 0.6   Macula good foveal reflex, focal exudates ST macula -- improved, scattered MA - improved, central cystic changes -- slightly improved, focal laser changes inferior macula good foveal reflex, scatttered MA's and exudate, central cystic changes -- persistent, focal cluster of exudates SN macula -- persistent   Vessels attenuated, Tortuous attenuated, Tortuous   Periphery Attached, scattered MA/exudates greatest inferiorly, faint CWS nasal midzone Attached, scattered MA and exudates greatest posteriorly, white without pressure temporally           Refraction     Wearing Rx       Sphere Cylinder Axis   Right -4.25 +0.75 021   Left -4.25 +1.00 042    Type: SVL           IMAGING AND PROCEDURES  Imaging and Procedures for @TODAY @  OCT, Retina - OU - Both Eyes       Right Eye Quality was good. Central Foveal Thickness: 235. Progression has worsened. Findings include normal foveal contour, no IRF, no SRF, intraretinal hyper-reflective material, vitreomacular adhesion (Mild interval increase in IRF/cystic changes nasal fovea and temporal macula, scattered punctate IRHM ).   Left Eye Quality was good. Central Foveal Thickness: 368. Progression has improved. Findings include no SRF, abnormal foveal contour, intraretinal  hyper-reflective material, intraretinal fluid (Persistent IRF / IRHM SN fovea and mac -- slightly increased centrally, but improved overall).   Notes *Images captured and stored on drive  Diagnosis / Impression:  +DME OU OD: Mild interval increase in IRF/cystic changes nasal fovea and temporal macula, scattered punctate IRHM  OS: Persistent IRF / IRHM SN fovea and mac -- slightly increased centrally, but improved overall  Clinical management:  See below  Abbreviations: NFP - Normal foveal profile. CME - cystoid macular edema. PED - pigment epithelial detachment. IRF - intraretinal fluid. SRF - subretinal fluid. EZ - ellipsoid zone. ERM - epiretinal membrane. ORA - outer retinal atrophy. ORT - outer retinal tubulation. SRHM - subretinal hyper-reflective material      Intravitreal Injection, Pharmacologic Agent - OS - Left Eye       Time Out 05/18/2023. 2:37 PM. Confirmed correct  patient, procedure, site, and patient consented.   Anesthesia Topical anesthesia was used. Anesthetic medications included Lidocaine 2%, Proparacaine 0.5%.   Procedure Preparation included 5% betadine to ocular surface, eyelid speculum. A (32g) needle was used.   Injection: 6 mg faricimab-svoa 6 MG/0.05ML   Route: Intravitreal, Site: Left Eye   NDC: 29562-130-86, Lot: [id number], Expiration date: 06/21/2024, Waste: 0 mL   Post-op Post injection exam found visual acuity of at least counting fingers. The patient tolerated the procedure well. There were no complications. The patient received written and verbal post procedure care education. Post injection medications were not given.            ASSESSMENT/PLAN:    ICD-10-CM   1. Moderate nonproliferative diabetic retinopathy of both eyes with macular edema associated with type 2 diabetes mellitus (HCC)  E11.3313 OCT, Retina - OU - Both Eyes    Intravitreal Injection, Pharmacologic Agent - OS - Left Eye    faricimab-svoa (VABYSMO) 6mg /0.24mL intravitreal  injection    CANCELED: Intravitreal Injection, Pharmacologic Agent - OD - Right Eye    2. Current use of insulin (HCC)  Z79.4     3. Long term (current) use of oral hypoglycemic drugs  Z79.84     4. Essential hypertension  I10     5. Hypertensive retinopathy of both eyes  H35.033     6. Nuclear sclerosis of both eyes  H25.13     7. Dry eyes  H04.123      1-3.  Moderate non-proliferative diabetic retinopathy w/ DME, OU  - delayed f/u -- 11.5 wks instead of 4 (10.25.24 to 01.15.25)  - A1c 6.6 (11.13.24), 7.7 (08.07.24), 9.0 (05.01.24)  - pt lost to f/u from 10.23.2019 to 11.18.20  - FA in 2019 showed leaking MA OU; no NV OU **of note, pt had pruritic rxn to fluorescein dye -- resolved with 12.5 mg IV benadryl**  - FA 12.16.2020 shows late leaking MA's, non-perfusion defects; no NV OU - s/p IVA OD #1 (12.16.20), #2 (01.25.21), #3 (05.26.21), #4 (07.02.21), #5 (10.15.21), #6 (11.23.21), #7 (07.27.22), #8 (09.09.22), #9 (11.11.22), #10 (01.20.23), #11 (02.23.23), #12 (04.09.23), #13 (05.23.23), #14 (06.30.23), #15 (08.04.23), #16 (09.01.23), #17 (10.06.23), #18 (11.10.23) #19 (12.15.23), #20 (01.19.24), #21 (02.22.24), #22 (04.09.24), #23 (06.28.24), #24 (07.29.24), #25 (09.27.24), #26 (10.25.24) - s/p IVA OS #1 (12.23.20), #2 (01.27.21), #3 (03.02.21), #4 (05.26.21), #5 (07.02.21), #6 (10.15.21), #7 (11.23.21), #8 (01.11.22), #9 (03.04.22), #10 (04.27.22), #11 (11.14.22), #12 (01.10.23), #13 (02.20.23), #14 (04.05.23), #15 (05.19.23), #16 (06.23.23), #17 (07.28.23), #18 (09.01.23), #19 (10.06.23), #20 (11.10.23) #21 (12.15.23), #22 (01.19.24), #23 (02.22.24), #24 (04.01.24), #25 (04.30.24), #26 (05.28.24), #27 (06.28.24) -- IVA Resistance  - s/p focal laser OD (03.19.21), (04.01.22) - s/p IVE OS #1 (07.29.24), #2 (08.30.24), #3 (09.24.24), #4 (10.25.24), #5 (01.15.25) -- IVE resistance - s/p IVV OS #1 (02.26.25)  - exam shows scattered MA, IRH, exudates and mild macular edema OU  - BCVA OD  20/20, OS 20/30 from 20/25 - OCT shows OD: Mild interval increase in IRF/cystic changes nasal fovea and temporal macula, scattered punctate IRHM at 5 mos since last IVA; OS: Persistent IRF / IRHM SN fovea and mac -- slightly increased centrally, but improved overall at 4 weeks - pt only wants one injection today - Recommend IVV OS #2 today, 03.26.25 w/ f/u within 1-2 wks for IVA OD - recommend treating OD PRN (~q42m this time)  - pt wishes to proceed with injection in the left eye - RBA of procedure discussed, questions  answered - see procedure note  - [facility] informed consent form was signed and scanned on 02.26.25 (OU)  - f/u within 1 week for IVA OD, DFE, OCT, possible injection(s)  4,5. Hypertensive retinopathy OU  - discussed importance of tight BP control             - continue to monitor   6. Nuclear Sclerosis  - The symptoms of cataract, surgical options, and treatments and risks were discussed with patient.   - discussed diagnosis and progression  - under the expert management of [facility]   7. Dry eyes OU - recommend artificial tears and lubricating ointment as needed  Return for f/u within one week for IVA OD.   Ophthalmic Meds Ordered this visit:  Meds ordered this encounter  Medications   faricimab-svoa (VABYSMO) 6mg /0.37mL intravitreal injection    This document serves as a record of services personally performed by Karie Chimera, MD, PhD. It was created on their behalf by Glee Arvin. Manson Passey, OA an ophthalmic technician. The creation of this record is the provider's dictation and/or activities during the visit.    Electronically signed by: Glee Arvin. Manson Passey, OA 05/22/23 2:04 AM   Karie Chimera, M.D., Ph.D. Diseases & Surgery of the Retina and Vitreous [facility]  I have reviewed the above documentation for accuracy and completeness, and I agree with the above. Karie Chimera, M.D., Ph.D. 05/22/23 2:06 AM   Abbreviations: M myopia  (nearsighted); A astigmatism; H hyperopia (farsighted); P presbyopia; Mrx spectacle prescription;  CTL contact lenses; OD right eye; OS left eye; OU both eyes  XT exotropia; ET esotropia; PEK punctate epithelial keratitis; PEE punctate epithelial erosions; DES dry eye syndrome; MGD meibomian gland dysfunction; ATs artificial tears; PFAT's preservative free artificial tears; NSC nuclear sclerotic cataract; PSC posterior subcapsular cataract; ERM epi-retinal membrane; PVD posterior vitreous detachment; RD retinal detachment; DM diabetes mellitus; DR diabetic retinopathy; NPDR non-proliferative diabetic retinopathy; PDR proliferative diabetic retinopathy; CSME clinically significant macular edema; DME diabetic macular edema; dbh dot blot hemorrhages; CWS cotton wool spot; POAG primary open angle glaucoma; C/D cup-to-disc ratio; HVF humphrey visual field; GVF goldmann visual field; OCT optical coherence tomography; IOP intraocular pressure; BRVO Branch retinal vein occlusion; CRVO central retinal vein occlusion; CRAO central retinal artery occlusion; BRAO branch retinal artery occlusion; RT retinal tear; SB scleral buckle; PPV pars plana vitrectomy; VH Vitreous hemorrhage; PRP panretinal laser photocoagulation; IVK intravitreal kenalog; VMT vitreomacular traction; MH Macular hole;  NVD neovascularization of the disc; NVE neovascularization elsewhere; AREDS age related eye disease study; ARMD age related macular degeneration; POAG primary open angle glaucoma; EBMD epithelial/anterior basement membrane dystrophy; ACIOL anterior chamber intraocular lens; IOL intraocular lens; PCIOL posterior chamber intraocular lens; Phaco/IOL phacoemulsification with intraocular lens placement; PRK photorefractive keratectomy; LASIK laser assisted in situ keratomileusis; HTN hypertension; DM diabetes mellitus; COPD chronic obstructive pulmonary disease

## 2023-05-18 ENCOUNTER — Ambulatory Visit (INDEPENDENT_AMBULATORY_CARE_PROVIDER_SITE_OTHER): Payer: Medicaid Other | Admitting: Ophthalmology

## 2023-05-18 ENCOUNTER — Encounter (INDEPENDENT_AMBULATORY_CARE_PROVIDER_SITE_OTHER): Payer: Self-pay | Admitting: Ophthalmology

## 2023-05-18 DIAGNOSIS — H04123 Dry eye syndrome of bilateral lacrimal glands: Secondary | ICD-10-CM

## 2023-05-18 DIAGNOSIS — I10 Essential (primary) hypertension: Secondary | ICD-10-CM

## 2023-05-18 DIAGNOSIS — H35033 Hypertensive retinopathy, bilateral: Secondary | ICD-10-CM

## 2023-05-18 DIAGNOSIS — Z7984 Long term (current) use of oral hypoglycemic drugs: Secondary | ICD-10-CM

## 2023-05-18 DIAGNOSIS — Z794 Long term (current) use of insulin: Secondary | ICD-10-CM | POA: Diagnosis not present

## 2023-05-18 DIAGNOSIS — H2513 Age-related nuclear cataract, bilateral: Secondary | ICD-10-CM

## 2023-05-18 DIAGNOSIS — E113313 Type 2 diabetes mellitus with moderate nonproliferative diabetic retinopathy with macular edema, bilateral: Secondary | ICD-10-CM

## 2023-05-18 MED ORDER — FARICIMAB-SVOA 6 MG/0.05ML IZ SOSY
6.0000 mg | PREFILLED_SYRINGE | INTRAVITREAL | Status: AC | PRN
  Administered 2023-05-18: 6 mg via INTRAVITREAL

## 2023-05-24 ENCOUNTER — Encounter (INDEPENDENT_AMBULATORY_CARE_PROVIDER_SITE_OTHER): Admitting: Ophthalmology

## 2023-05-24 NOTE — Progress Notes (Shared)
 [facility] - Clinic Note  05/25/2023     CHIEF COMPLAINT Patient presents for Retina Follow Up  HISTORY OF PRESENT ILLNESS: Ana Long is a 48 y.o. female who presents to the clinic today for:   HPI     Retina Follow Up   Patient presents with  Diabetic Retinopathy.  In both eyes.  This started 1 week ago.  I, the attending physician,  performed the HPI with the patient and updated documentation appropriately.        Comments   Patient here for 1 week retina follow up for NPDR OU. Patient states vision is ok. Only wanted OD dilated.   Having a problem with pollen with eye running all day. No eye pain.       Last edited by Rennis Chris, MD on 05/25/2023  6:11 PM.    Patient states vision is the same  Referring physician: Olivia Canter MD [address] [place], [place] [address]  HISTORICAL INFORMATION:   Selected notes from the MEDICAL RECORD NUMBER Re-referred by Dr. Fabian Sharp for DM exam   CURRENT MEDICATIONS: Current Outpatient Medications (Ophthalmic Drugs)  Medication Sig   Olopatadine HCl 0.2 % SOLN Place one drop into both eyes daily.   No current facility-administered medications for this visit. (Ophthalmic Drugs)   Current Outpatient Medications (Other)  Medication Sig   acyclovir (ZOVIRAX) 400 MG tablet TAKE 1 TABLET BY MOUTH TWICE A DAY   albuterol (PROAIR HFA) 108 (90 Base) MCG/ACT inhaler Inhale 2 puffs into the lungs every 4 (four) hours as needed for wheezing or shortness of breath (cough). (Patient taking differently: Inhale 2 puffs into the lungs every 4 (four) hours as needed for wheezing or shortness of breath (or coughing).)   atorvastatin (LIPITOR) 10 MG tablet Take 10 mg by mouth daily.   benzonatate (TESSALON) 200 MG capsule Take 1 capsule (200 mg total) by mouth 3 (three) times daily as needed for cough.   CIPRODEX OTIC suspension Place into the right ear.   cyclobenzaprine (FLEXERIL) 10 MG tablet Take 1 tablet  (10 mg total) by mouth 3 (three) times daily as needed for muscle spasms.   DERMA-SMOOTHE/FS SCALP 0.01 % OIL Apply topically.   diclofenac (VOLTAREN) 75 MG EC tablet Take 1 tablet (75 mg total) by mouth 2 (two) times daily.   fluconazole (DIFLUCAN) 150 MG tablet TAKE 1 TABLET BY MOUTH NOW. THEN REPEAT IN 1 WEEK   furosemide (LASIX) 20 MG tablet Take 20 mg by mouth daily as needed for fluid or edema.   HUMULIN R U-500 KWIKPEN 500 UNIT/ML kwikpen Inject into the skin.   hydrochlorothiazide (HYDRODIURIL) 12.5 MG tablet Take by mouth.   Insulin Pen Needle (B-D UF III MINI PEN NEEDLES) 31G X 5 MM MISC USE AS DIRECTED TO INJECT INSULIN FIVE A DAY E11.65   lactulose (CEPHULAC) 10 g packet Take 1 packet (10 g total) by mouth daily as needed. Reported on 07/29/2015 (Patient taking differently: Take 10 g by mouth daily as needed (for constipation).)   loratadine (CLARITIN) 10 MG tablet Take 1 tablet (10 mg total) by mouth 2 (two) times daily.   mometasone-formoterol (DULERA) 200-5 MCG/ACT AERO Inhale 2 puffs into the lungs 2 (two) times daily.   montelukast (SINGULAIR) 10 MG tablet Take 1 tablet (10 mg total) by mouth at bedtime.   nitroGLYCERIN (NITRODUR - DOSED IN MG/24 HR) 0.2 mg/hr patch Apply 1/4th patch to affected achilles, change daily  omeprazole (PRILOSEC) 20 MG capsule Take 1 capsule (20 mg total) by mouth daily before breakfast.   SPIRIVA RESPIMAT 1.25 MCG/ACT AERS SMARTSIG:2 Puff(s) Via Inhaler Daily   sulfamethoxazole-trimethoprim (BACTRIM DS) 800-160 MG tablet Take 1 tablet by mouth 2 (two) times daily.   topiramate (TOPAMAX) 25 MG tablet Take by mouth.   Blood Glucose Monitoring Suppl (ACCU-CHEK AVIVA PLUS) w/Device KIT Please check you blood sugars 4 times a day (Patient not taking: Reported on 05/18/2023)   glucose blood (ACCU-CHEK SMARTVIEW) test strip TEST FOUR TIMES DAILY (Patient not taking: Reported on 05/18/2023)   Lancets (ACCU-CHEK SOFT TOUCH) lancets Use as instructed (Patient not  taking: Reported on 05/18/2023)   Current Facility-Administered Medications (Other)  Medication Route   diphenhydrAMINE (BENADRYL) injection 12.5 mg Intramuscular   REVIEW OF SYSTEMS: ROS   Positive for: Endocrine, Eyes, Respiratory Negative for: Constitutional, Gastrointestinal, Neurological, Skin, Genitourinary, Musculoskeletal, HENT, Cardiovascular, Psychiatric, Allergic/Imm, Heme/Lymph Last edited by Laddie Aquas, COA on 05/25/2023  2:41 PM.      ALLERGIES Allergies  Allergen Reactions   Bee Venom Anaphylaxis and Swelling   Wasp Venom Anaphylaxis and Swelling   Citrus Hives and Itching   Latex Hives and Swelling   Penicillins Hives and Itching    Has patient had a PCN reaction causing immediate rash, facial/tongue/throat swelling, SOB or lightheadedness with hypotension: Yes Has patient had a PCN reaction causing severe rash involving mucus membranes or skin necrosis: Unk Has patient had a PCN reaction that required hospitalization: Was already in hosp Has patient had a PCN reaction occurring within the last 10 years: No If all of the above answers are "NO", then may proceed with Cephalosporin use.    Shellfish Allergy Other (See Comments) and Swelling    Patient is uncertain; stated that she tolerates this (??)   Soap Hives, Itching and Swelling    PUREX LAUNDRY DETERGENT   Tomato Hives and Itching   Banana Itching and Nausea Only   Chocolate Itching   Fluticasone Other (See Comments)    Per patient, makes sneeze and gag   Hydrocodone Itching and Nausea Only   Other Itching    Acidic foods- Itching [place]- Itching in the roof of the mouth   Adhesive [Tape] Itching and Rash    Allergic to  "leads"   Doxycycline Other (See Comments)    AFFECTS JOINTS   Liraglutide Nausea Only   Metformin And Related Nausea Only, Rash, Other (See Comments) and Diarrhea    GI UPSET, also   Red Dye #40 (Allura Red) Itching, Rash and Other (See Comments)    At eye doctor's office,  the dilating drops- Skin breaks out, chest turned red, itching, face became swollen   PAST MEDICAL HISTORY 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   Cataract    NS OU   Complication of anesthesia    states is hard to wake up post-op   Constipation 12/30/2014   Diabetes mellitus    intolerant to Metformin   Diabetic retinopathy (HCC)    NPDR OU   Dry skin    hands    Family history of anesthesia complication    mother went into coma during c-section   Genital herpes 01/01/2006  on acyclovir BID    GERD (gastroesophageal reflux disease)    Herpes genital    Hyperglycemia 12/26/2017   Hypertensive retinopathy    OU   Low back pain 11/22/2012   Lower extremity edema 10/18/2012   Menorrhagia 11/06/2015   Morbid obesity with BMI of 50 to 60 07/23/2009   Obesity hypoventilation syndrome (HCC) 10/30/2012   Obesity, morbid (HCC) 01/10/2014   OSA on CPAP     AHI was on  11-17-12 to 120/hr, titrated to 15 cm with 3 cm EPR/    PCOS (polycystic ovarian syndrome)    Rheumatoid arthritis (HCC)    Right ankle pain 06/19/2015   Right hip pain 09/04/2017   Right shoulder pain 06/01/2017   Seasonal allergies    Sleep apnea, obstructive    uses CPAP nightly - had sleep study 08/22/2007   Past Surgical History:  Procedure Laterality Date   CARPAL TUNNEL RELEASE Right 10/02/2012   Procedure: RIGHT CARPAL TUNNEL RELEASE ENDOSCOPIC;  Surgeon: Jodi Marble, MD;  Location: [facility];  Service: Orthopedics;  Laterality: Right;   CHOLECYSTECTOMY N/A 12/02/2014   Procedure: LAPAROSCOPIC CHOLECYSTECTOMY;  Surgeon: Darnell Level, MD;  Location: [facility];  Service: General;  Laterality: N/A;   HYSTEROSCOPY WITH D & C  01-08-2002   FAMILY  HISTORY Family History  Problem Relation Age of Onset   Other Mother    Diabetes Maternal Aunt    Heart attack Maternal Aunt    Alzheimer's disease Maternal Uncle    Cancer Maternal Uncle        prostate   Diabetes Paternal Aunt    Cancer Paternal Aunt        brain,mouth   Alzheimer's disease Paternal Aunt    Colon polyps Paternal Uncle    Diabetes Paternal Uncle    Diabetes Maternal Grandmother    Diabetes Maternal Grandfather    Leukemia Cousin    Other Other    Obesity Other    Other Other    Colitis Neg Hx    SOCIAL HISTORY Social History   Tobacco Use   Smoking status: Former    Current packs/day: 0.00    Types: Cigarettes    Start date: 05/23/2000    Quit date: 05/24/2010    Years since quitting: 13.0   Smokeless tobacco: Never  Vaping Use   Vaping status: Never Used  Substance Use Topics   Alcohol use: Yes    Alcohol/week: 0.0 standard drinks of alcohol    Comment: socially   Drug use: No       OPHTHALMIC EXAM: Base Eye Exam     Visual Acuity (Snellen - Linear)       Right Left   Dist cc 20/20 -2 20/30 -2   Dist ph cc  20/25 -1    Correction: Glasses         Tonometry (Tonopen, 2:38 PM)       Right Left   Pressure 18 21         Pupils       Dark Light Shape React APD   Right 3 2 Round Brisk None   Left 3 2 Round Brisk None         Visual Fields (Counting fingers)       Left Right    Full Full         Extraocular Movement       Right Left    Full, Ortho Full, Ortho  Neuro/Psych     Oriented x3: Yes   Mood/Affect: Normal         Dilation     Right eye: 1.0% Mydriacyl, 2.5% Phenylephrine @ 2:38 PM           Slit Lamp and Fundus Exam     Slit Lamp Exam       Right Left   Lids/Lashes Dermatochalasis - upper lid, mild Meibomian gland dysfunction Dermatochalasis - upper lid, mild Meibomian gland dysfunction   Conjunctiva/Sclera White and quiet White and quiet   Cornea 2+Punctate epithelial erosions,  mild tear film debris 3+fine Punctate epithelial erosions   Anterior Chamber deep and quiet deep and quiet   Iris round and dilated, No NVI round and dilated   Lens 1+ NS, 1+CC 1+NS, 1+CC   Anterior Vitreous mild vitreous syneresis; PVD; mild vit condensations mild vitreous syneresis, scattered vitreous condensations, silicone oil micro bubbles trapped within vit         Fundus Exam       Right Left   Disc Pink and sharp, +cupping Pink and sharp, +cupping   C/D Ratio 0.7 0.6   Macula good foveal reflex, focal exudates ST macula -- improved, scattered MA - improved, central cystic changes, focal laser changes inferior macula, new blot heme temporal macula good foveal reflex, scatttered MA's and exudate, central cystic changes -- persistent, focal cluster of exudates SN macula -- persistent   Vessels attenuated, Tortuous attenuated, Tortuous   Periphery Attached, scattered MA/exudates greatest inferiorly, faint CWS nasal midzone Attached, scattered MA and exudates greatest posteriorly, white without pressure temporally           Refraction     Wearing Rx       Sphere Cylinder Axis   Right -4.25 +0.75 021   Left -4.25 +1.00 042    Type: SVL           IMAGING AND PROCEDURES  Imaging and Procedures for @TODAY @  OCT, Retina - OU - Both Eyes       Right Eye Quality was good. Central Foveal Thickness: 244. Progression has worsened. Findings include normal foveal contour, no IRF, no SRF, intraretinal hyper-reflective material, vitreomacular adhesion (Mild interval increase in IRF/cystic changes temporal macula, scattered punctate IRHM ).   Left Eye Quality was good. Central Foveal Thickness: 367. Progression has improved. Findings include no SRF, abnormal foveal contour, intraretinal hyper-reflective material, intraretinal fluid (Persistent IRF / IRHM SN fovea and mac -- slightly improved ).   Notes *Images captured and stored on drive  Diagnosis / Impression:  +DME  OU OD: Mild interval increase in IRF/cystic changes nasal fovea and temporal macula, scattered punctate IRHM  OS: Persistent IRF / IRHM SN fovea and mac -- slightly improved   Clinical management:  See below  Abbreviations: NFP - Normal foveal profile. CME - cystoid macular edema. PED - pigment epithelial detachment. IRF - intraretinal fluid. SRF - subretinal fluid. EZ - ellipsoid zone. ERM - epiretinal membrane. ORA - outer retinal atrophy. ORT - outer retinal tubulation. SRHM - subretinal hyper-reflective material      Intravitreal Injection, Pharmacologic Agent - OD - Right Eye       Time Out 05/25/2023. 4:17 PM. Confirmed correct patient, procedure, site, and patient consented.   Anesthesia Topical anesthesia was used. Anesthetic medications included Lidocaine 2%, Proparacaine 0.5%.   Procedure Preparation included 5% betadine to ocular surface, eyelid speculum. A (32g) needle was used.   Injection: 1.25 mg Bevacizumab 1.25mg /0.51ml   Route:  Intravitreal, Site: Right Eye   NDC: [id number], Lot: [id number], Expiration date: 03/10/2024   Post-op Post injection exam found visual acuity of at least counting fingers. The patient tolerated the procedure well. There were no complications. The patient received written and verbal post procedure care education. Post injection medications were not given.            ASSESSMENT/PLAN:    ICD-10-CM   1. Moderate nonproliferative diabetic retinopathy of both eyes with macular edema associated with type 2 diabetes mellitus (HCC)  E11.3313 OCT, Retina - OU - Both Eyes    Intravitreal Injection, Pharmacologic Agent - OD - Right Eye    Bevacizumab (AVASTIN) SOLN 1.25 mg    2. Current use of insulin (HCC)  Z79.4     3. Long term (current) use of oral hypoglycemic drugs  Z79.84     4. Essential hypertension  I10     5. Hypertensive retinopathy of both eyes  H35.033     6. Nuclear sclerosis of both eyes  H25.13     7. Dry eyes  H04.123       1-3.  Moderate non-proliferative diabetic retinopathy w/ DME, OU  - delayed f/u -- 11.5 wks instead of 4 (10.25.24 to 01.15.25)  - A1c 6.6 (11.13.24), 7.7 (08.07.24), 9.0 (05.01.24)  - pt lost to f/u from 10.23.2019 to 11.18.20  - FA in 2019 showed leaking MA OU; no NV OU **of note, pt had pruritic rxn to fluorescein dye -- resolved with 12.5 mg IV benadryl**  - FA 12.16.2020 shows late leaking MA's, non-perfusion defects; no NV OU - s/p IVA OD #1 (12.16.20), #2 (01.25.21), #3 (05.26.21), #4 (07.02.21), #5 (10.15.21), #6 (11.23.21), #7 (07.27.22), #8 (09.09.22), #9 (11.11.22), #10 (01.20.23), #11 (02.23.23), #12 (04.09.23), #13 (05.23.23), #14 (06.30.23), #15 (08.04.23), #16 (09.01.23), #17 (10.06.23), #18 (11.10.23) #19 (12.15.23), #20 (01.19.24), #21 (02.22.24), #22 (04.09.24), #23 (06.28.24), #24 (07.29.24), #25 (09.27.24), #26 (10.25.24) - s/p IVA OS #1 (12.23.20), #2 (01.27.21), #3 (03.02.21), #4 (05.26.21), #5 (07.02.21), #6 (10.15.21), #7 (11.23.21), #8 (01.11.22), #9 (03.04.22), #10 (04.27.22), #11 (11.14.22), #12 (01.10.23), #13 (02.20.23), #14 (04.05.23), #15 (05.19.23), #16 (06.23.23), #17 (07.28.23), #18 (09.01.23), #19 (10.06.23), #20 (11.10.23) #21 (12.15.23), #22 (01.19.24), #23 (02.22.24), #24 (04.01.24), #25 (04.30.24), #26 (05.28.24), #27 (06.28.24) -- IVA Resistance  - s/p focal laser OD (03.19.21), (04.01.22) - s/p IVE OS #1 (07.29.24), #2 (08.30.24), #3 (09.24.24), #4 (10.25.24), #5 (01.15.25) -- IVE resistance - s/p IVV OS #1 (02.26.25), #2 (03.26.25)  - exam shows scattered MA, IRH, exudates and mild macular edema OU  - BCVA OD 20/20, OS 20/25 -- stable OU - OCT shows OD: Mild interval increase in IRF/cystic changes nasal fovea and temporal macula, scattered punctate IRHM at 5 mos since last IVA; OS: Persistent IRF / IRHM SN fovea and mac -- slightly improved - Recommend IVA OD #27 today, 04.02.25 w/ f/u in 4 wks - treating OD PRN (~q55m this time)  - pt wishes to  proceed with injection in the left eye - RBA of procedure discussed, questions answered - see procedure note  - [facility] informed consent form was signed and scanned on 02.26.25 (OU)  - f/u 4 weeks, DFE, OCT, possible injection(s)  4,5. Hypertensive retinopathy OU  - discussed importance of tight BP control             - continue to monitor   6. Nuclear Sclerosis  - The symptoms of cataract, surgical options, and treatments and risks were discussed with patient.   -  discussed diagnosis and progression  - under the expert management of [facility]   7. Dry eyes OU - recommend artificial tears and lubricating ointment as needed  Return in about 4 weeks (around 06/22/2023) for f/u NPDR OU, DFE, OCT.   Ophthalmic Meds Ordered this visit:  Meds ordered this encounter  Medications   Bevacizumab (AVASTIN) SOLN 1.25 mg    This document serves as a record of services personally performed by Karie Chimera, MD, PhD. It was created on their behalf by Annalee Genta, COMT. The creation of this record is the provider's dictation and/or activities during the visit.  Electronically signed by: Annalee Genta, COMT 05/25/23 11:16 PM  This document serves as a record of services personally performed by Karie Chimera, MD, PhD. It was created on their behalf by Glee Arvin. Manson Passey, OA an ophthalmic technician. The creation of this record is the provider's dictation and/or activities during the visit.    Electronically signed by: Glee Arvin. Manson Passey, OA 05/25/23 11:16 PM  Karie Chimera, M.D., Ph.D. Diseases & Surgery of the Retina and Vitreous [facility]  I have reviewed the above documentation for accuracy and completeness, and I agree with the above. Karie Chimera, M.D., Ph.D. 05/25/23 11:44 PM   Abbreviations: M myopia (nearsighted); A astigmatism; H hyperopia (farsighted); P presbyopia; Mrx spectacle prescription;  CTL contact lenses; OD right eye; OS left eye; OU both eyes   XT exotropia; ET esotropia; PEK punctate epithelial keratitis; PEE punctate epithelial erosions; DES dry eye syndrome; MGD meibomian gland dysfunction; ATs artificial tears; PFAT's preservative free artificial tears; NSC nuclear sclerotic cataract; PSC posterior subcapsular cataract; ERM epi-retinal membrane; PVD posterior vitreous detachment; RD retinal detachment; DM diabetes mellitus; DR diabetic retinopathy; NPDR non-proliferative diabetic retinopathy; PDR proliferative diabetic retinopathy; CSME clinically significant macular edema; DME diabetic macular edema; dbh dot blot hemorrhages; CWS cotton wool spot; POAG primary open angle glaucoma; C/D cup-to-disc ratio; HVF humphrey visual field; GVF goldmann visual field; OCT optical coherence tomography; IOP intraocular pressure; BRVO Branch retinal vein occlusion; CRVO central retinal vein occlusion; CRAO central retinal artery occlusion; BRAO branch retinal artery occlusion; RT retinal tear; SB scleral buckle; PPV pars plana vitrectomy; VH Vitreous hemorrhage; PRP panretinal laser photocoagulation; IVK intravitreal kenalog; VMT vitreomacular traction; MH Macular hole;  NVD neovascularization of the disc; NVE neovascularization elsewhere; AREDS age related eye disease study; ARMD age related macular degeneration; POAG primary open angle glaucoma; EBMD epithelial/anterior basement membrane dystrophy; ACIOL anterior chamber intraocular lens; IOL intraocular lens; PCIOL posterior chamber intraocular lens; Phaco/IOL phacoemulsification with intraocular lens placement; PRK photorefractive keratectomy; LASIK laser assisted in situ keratomileusis; HTN hypertension; DM diabetes mellitus; COPD chronic obstructive pulmonary disease

## 2023-05-25 ENCOUNTER — Ambulatory Visit (INDEPENDENT_AMBULATORY_CARE_PROVIDER_SITE_OTHER): Admitting: Ophthalmology

## 2023-05-25 ENCOUNTER — Encounter (INDEPENDENT_AMBULATORY_CARE_PROVIDER_SITE_OTHER): Payer: Self-pay | Admitting: Ophthalmology

## 2023-05-25 DIAGNOSIS — Z7984 Long term (current) use of oral hypoglycemic drugs: Secondary | ICD-10-CM | POA: Diagnosis not present

## 2023-05-25 DIAGNOSIS — Z794 Long term (current) use of insulin: Secondary | ICD-10-CM | POA: Diagnosis not present

## 2023-05-25 DIAGNOSIS — H2513 Age-related nuclear cataract, bilateral: Secondary | ICD-10-CM

## 2023-05-25 DIAGNOSIS — E113313 Type 2 diabetes mellitus with moderate nonproliferative diabetic retinopathy with macular edema, bilateral: Secondary | ICD-10-CM

## 2023-05-25 DIAGNOSIS — I10 Essential (primary) hypertension: Secondary | ICD-10-CM

## 2023-05-25 DIAGNOSIS — H35033 Hypertensive retinopathy, bilateral: Secondary | ICD-10-CM

## 2023-05-25 DIAGNOSIS — H04123 Dry eye syndrome of bilateral lacrimal glands: Secondary | ICD-10-CM

## 2023-05-25 MED ORDER — BEVACIZUMAB CHEMO INJECTION 1.25MG/0.05ML SYRINGE FOR KALEIDOSCOPE
1.2500 mg | INTRAVITREAL | Status: AC | PRN
  Administered 2023-05-25: 1.25 mg via INTRAVITREAL

## 2023-06-22 NOTE — Progress Notes (Signed)
 [facility] - Clinic Note  06/24/2023     CHIEF COMPLAINT Patient presents for Retina Follow Up  HISTORY OF PRESENT ILLNESS: Ana Long is a 48 y.o. female who presents to the clinic today for:   HPI     Retina Follow Up   Patient presents with  Diabetic Retinopathy.  In both eyes.  This started 4 weeks ago.  I, the attending physician,  performed the HPI with the patient and updated documentation appropriately.        Comments   Patient here for 4 weeks retina follow up for NPDR OU. Patient states vision about the same. No eye pain today.      Last edited by Ronelle Coffee, MD on 06/24/2023  9:38 PM.    Patient states vision is stable  Referring physician: Sidra Dredge MD [address] [place], [place] [address]  HISTORICAL INFORMATION:   Selected notes from the MEDICAL RECORD NUMBER Re-referred by Dr. Diedre Fox for DM exam   CURRENT MEDICATIONS: Current Outpatient Medications (Ophthalmic Drugs)  Medication Sig   Olopatadine HCl 0.2 % SOLN Place one drop into both eyes daily.   No current facility-administered medications for this visit. (Ophthalmic Drugs)   Current Outpatient Medications (Other)  Medication Sig   acyclovir  (ZOVIRAX ) 400 MG tablet TAKE 1 TABLET BY MOUTH TWICE A DAY   albuterol  (PROAIR  HFA) 108 (90 Base) MCG/ACT inhaler Inhale 2 puffs into the lungs every 4 (four) hours as needed for wheezing or shortness of breath (cough). (Patient taking differently: Inhale 2 puffs into the lungs every 4 (four) hours as needed for wheezing or shortness of breath (or coughing).)   atorvastatin  (LIPITOR) 10 MG tablet Take 10 mg by mouth daily.   benzonatate  (TESSALON ) 200 MG capsule Take 1 capsule (200 mg total) by mouth 3 (three) times daily as needed for cough.   CIPRODEX OTIC suspension Place into the right ear.   cyclobenzaprine  (FLEXERIL ) 10 MG tablet Take 1 tablet (10 mg total) by mouth 3 (three) times daily as needed for muscle  spasms.   DERMA-SMOOTHE/FS SCALP 0.01 % OIL Apply topically.   diclofenac  (VOLTAREN ) 75 MG EC tablet Take 1 tablet (75 mg total) by mouth 2 (two) times daily.   fluconazole  (DIFLUCAN ) 150 MG tablet TAKE 1 TABLET BY MOUTH NOW. THEN REPEAT IN 1 WEEK   furosemide  (LASIX ) 20 MG tablet Take 20 mg by mouth daily as needed for fluid or edema.   HUMULIN  R U-500 KWIKPEN 500 UNIT/ML kwikpen Inject into the skin.   hydrochlorothiazide (HYDRODIURIL) 12.5 MG tablet Take by mouth.   Insulin  Pen Needle (B-D UF III MINI PEN NEEDLES) 31G X 5 MM MISC USE AS DIRECTED TO INJECT INSULIN  FIVE A DAY E11.65   lactulose  (CEPHULAC ) 10 g packet Take 1 packet (10 g total) by mouth daily as needed. Reported on 07/29/2015 (Patient taking differently: Take 10 g by mouth daily as needed (for constipation).)   loratadine  (CLARITIN ) 10 MG tablet Take 1 tablet (10 mg total) by mouth 2 (two) times daily.   mometasone -formoterol  (DULERA ) 200-5 MCG/ACT AERO Inhale 2 puffs into the lungs 2 (two) times daily.   montelukast  (SINGULAIR ) 10 MG tablet Take 1 tablet (10 mg total) by mouth at bedtime.   nitroGLYCERIN  (NITRODUR - DOSED IN MG/24 HR) 0.2 mg/hr patch Apply 1/4th patch to affected achilles, change daily   omeprazole  (PRILOSEC) 20 MG capsule Take 1 capsule (20 mg total) by mouth daily before breakfast.  SPIRIVA RESPIMAT 1.25 MCG/ACT AERS SMARTSIG:2 Puff(s) Via Inhaler Daily   sulfamethoxazole -trimethoprim  (BACTRIM  DS) 800-160 MG tablet Take 1 tablet by mouth 2 (two) times daily.   topiramate  (TOPAMAX ) 25 MG tablet Take by mouth.   Blood Glucose Monitoring Suppl (ACCU-CHEK AVIVA PLUS) w/Device KIT Please check you blood sugars 4 times a day (Patient not taking: Reported on 05/18/2023)   glucose blood (ACCU-CHEK SMARTVIEW) test strip TEST FOUR TIMES DAILY (Patient not taking: Reported on 05/18/2023)   Lancets (ACCU-CHEK SOFT TOUCH) lancets Use as instructed (Patient not taking: Reported on 05/18/2023)   Current Facility-Administered  Medications (Other)  Medication Route   diphenhydrAMINE  (BENADRYL ) injection 12.5 mg Intramuscular   REVIEW OF SYSTEMS: ROS   Positive for: Endocrine, Eyes, Respiratory Negative for: Constitutional, Gastrointestinal, Neurological, Skin, Genitourinary, Musculoskeletal, HENT, Cardiovascular, Psychiatric, Allergic/Imm, Heme/Lymph Last edited by Sylvan Evener, COA on 06/24/2023  1:43 PM.     ALLERGIES Allergies  Allergen Reactions   Bee Venom Anaphylaxis and Swelling   Wasp Venom Anaphylaxis and Swelling   Citrus Hives and Itching   Latex Hives and Swelling   Penicillins Hives and Itching    Has patient had a PCN reaction causing immediate rash, facial/tongue/throat swelling, SOB or lightheadedness with hypotension: Yes Has patient had a PCN reaction causing severe rash involving mucus membranes or skin necrosis: Unk Has patient had a PCN reaction that required hospitalization: Was already in hosp Has patient had a PCN reaction occurring within the last 10 years: No If all of the above answers are "NO", then may proceed with Cephalosporin use.    Shellfish Allergy Other (See Comments) and Swelling    Patient is uncertain; stated that she tolerates this (??)   Soap Hives, Itching and Swelling    PUREX LAUNDRY DETERGENT   Tomato Hives and Itching   Banana Itching and Nausea Only   Chocolate Itching   Fluticasone  Other (See Comments)    Per patient, makes sneeze and gag   Hydrocodone  Itching and Nausea Only   Other Itching    Acidic foods- Itching [place]- Itching in the roof of the mouth   Adhesive [Tape] Itching and Rash    Allergic to  "leads"   Doxycycline Other (See Comments)    AFFECTS JOINTS   Liraglutide Nausea Only   Metformin  And Related Nausea Only, Rash, Other (See Comments) and Diarrhea    GI UPSET, also   Red Dye #40 (Allura Red) Itching, Rash and Other (See Comments)    At eye doctor's office, the dilating drops- Skin breaks out, chest turned red, itching, face  became swollen   PAST MEDICAL HISTORY 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   Cataract    NS OU   Complication of anesthesia    states is hard to wake up post-op   Constipation 12/30/2014   Diabetes mellitus    intolerant to Metformin    Diabetic retinopathy (HCC)    NPDR OU   Dry skin    hands    Family history of anesthesia complication    mother went into coma during c-section   Genital herpes 01/01/2006   on acyclovir  BID    GERD (gastroesophageal reflux disease)    Herpes genital  Hyperglycemia 12/26/2017   Hypertensive retinopathy    OU   Low back pain 11/22/2012   Lower extremity edema 10/18/2012   Menorrhagia 11/06/2015   Morbid obesity with BMI of 50 to 60 07/23/2009   Obesity hypoventilation syndrome (HCC) 10/30/2012   Obesity, morbid (HCC) 01/10/2014   OSA on CPAP     AHI was on  11-17-12 to 120/hr, titrated to 15 cm with 3 cm EPR/    PCOS (polycystic ovarian syndrome)    Rheumatoid arthritis (HCC)    Right ankle pain 06/19/2015   Right hip pain 09/04/2017   Right shoulder pain 06/01/2017   Seasonal allergies    Sleep apnea, obstructive    uses CPAP nightly - had sleep study 08/22/2007   Past Surgical History:  Procedure Laterality Date   CARPAL TUNNEL RELEASE Right 10/02/2012   Procedure: RIGHT CARPAL TUNNEL RELEASE ENDOSCOPIC;  Surgeon: Sheryl Donna, MD;  Location: [facility];  Service: Orthopedics;  Laterality: Right;   CHOLECYSTECTOMY N/A 12/02/2014   Procedure: LAPAROSCOPIC CHOLECYSTECTOMY;  Surgeon: Oralee Billow, MD;  Location: [facility];  Service: General;  Laterality: N/A;   HYSTEROSCOPY WITH D & C  01-08-2002   FAMILY HISTORY Family History  Problem Relation Age of Onset   Other Mother     Diabetes Maternal Aunt    Heart attack Maternal Aunt    Alzheimer's disease Maternal Uncle    Cancer Maternal Uncle        prostate   Diabetes Paternal Aunt    Cancer Paternal Aunt        brain,mouth   Alzheimer's disease Paternal Aunt    Colon polyps Paternal Uncle    Diabetes Paternal Uncle    Diabetes Maternal Grandmother    Diabetes Maternal Grandfather    Leukemia Cousin    Other Other    Obesity Other    Other Other    Colitis Neg Hx    SOCIAL HISTORY Social History   Tobacco Use   Smoking status: Former    Current packs/day: 0.00    Types: Cigarettes    Start date: 05/23/2000    Quit date: 05/24/2010    Years since quitting: 13.0   Smokeless tobacco: Never  Vaping Use   Vaping status: Never Used  Substance Use Topics   Alcohol use: Yes    Alcohol/week: 0.0 standard drinks of alcohol    Comment: socially   Drug use: No       OPHTHALMIC EXAM: Base Eye Exam     Visual Acuity (Snellen - Linear)       Right Left   Dist cc 20/20 20/30   Dist ph cc  20/20 -1    Correction: Glasses         Tonometry (Tonopen, 1:41 PM)       Right Left   Pressure 18 20         Pupils       Dark Light Shape React APD   Right 3 2 Round Brisk None   Left 3 2 Round Brisk None         Visual Fields (Counting fingers)       Left Right    Full Full         Extraocular Movement       Right Left    Full, Ortho Full, Ortho         Neuro/Psych     Oriented x3: Yes   Mood/Affect: Normal  Dilation     Both eyes: 1.0% Mydriacyl, 2.5% Phenylephrine @ 1:41 PM           Slit Lamp and Fundus Exam     Slit Lamp Exam       Right Left   Lids/Lashes Dermatochalasis - upper lid, mild Meibomian gland dysfunction Dermatochalasis - upper lid, mild Meibomian gland dysfunction   Conjunctiva/Sclera White and quiet White and quiet   Cornea 2+Punctate epithelial erosions, mild tear film debris 3+fine Punctate epithelial erosions   Anterior Chamber deep  and quiet deep and quiet   Iris round and dilated, No NVI round and dilated   Lens 1+ NS, 1+CC 1+NS, 1+CC   Anterior Vitreous mild vitreous syneresis; PVD; mild vit condensations mild vitreous syneresis, scattered vitreous condensations, silicone oil micro bubbles trapped within vit         Fundus Exam       Right Left   Disc Pink and sharp, +cupping Pink and sharp, +cupping   C/D Ratio 0.7 0.6   Macula good foveal reflex, focal exudates ST macula -- improved, scattered MA - improved, cluster of IRH and edema temporal macula -- slightly increased, focal laser changes inferior macula good foveal reflex, scatttered MA's and exudate, central cystic changes -- improved, focal cluster of exudates SN macula -- persistent   Vessels attenuated, Tortuous attenuated, Tortuous   Periphery Attached, scattered MA/exudates greatest inferiorly, faint CWS nasal midzone Attached, scattered MA and exudates greatest posteriorly, white without pressure temporally           Refraction     Wearing Rx       Sphere Cylinder Axis   Right -4.25 +0.75 021   Left -4.25 +1.00 042    Type: SVL           IMAGING AND PROCEDURES  Imaging and Procedures for @TODAY @  OCT, Retina - OU - Both Eyes       Right Eye Quality was good. Central Foveal Thickness: 231. Progression has worsened. Findings include normal foveal contour, no IRF, no SRF, intraretinal hyper-reflective material, vitreomacular adhesion (Mild interval increase in IRF/cystic changes temporal macula, scattered punctate IRHM ).   Left Eye Quality was good. Central Foveal Thickness: 287. Progression has improved. Findings include no SRF, abnormal foveal contour, intraretinal hyper-reflective material, intraretinal fluid (Interval improvement in IRF / IRHM SN fovea and mac).   Notes *Images captured and stored on drive  Diagnosis / Impression:  +DME OU OD: Mild interval increase in IRF/cystic changes nasal fovea and temporal macula,  scattered punctate IRHM  OS: Interval improvement in IRF / IRHM SN fovea and mac  Clinical management:  See below  Abbreviations: NFP - Normal foveal profile. CME - cystoid macular edema. PED - pigment epithelial detachment. IRF - intraretinal fluid. SRF - subretinal fluid. EZ - ellipsoid zone. ERM - epiretinal membrane. ORA - outer retinal atrophy. ORT - outer retinal tubulation. SRHM - subretinal hyper-reflective material      Intravitreal Injection, Pharmacologic Agent - OD - Right Eye       Time Out 06/24/2023. 2:21 PM. Confirmed correct patient, procedure, site, and patient consented.   Anesthesia Topical anesthesia was used. Anesthetic medications included Lidocaine  2%, Proparacaine 0.5%.   Procedure Preparation included 5% betadine to ocular surface, eyelid speculum. A (32g) needle was used.   Injection: 2 mg aflibercept  2 MG/0.05ML   Route: Intravitreal, Site: Right Eye   NDC: Q956576, Lot: [id number], Expiration date: 06/21/2024, Waste: 0 mL   Post-op Post injection  exam found visual acuity of at least counting fingers. The patient tolerated the procedure well. There were no complications. The patient received written and verbal post procedure care education. Post injection medications were not given.      Intravitreal Injection, Pharmacologic Agent - OS - Left Eye       Time Out 06/24/2023. 2:22 PM. Confirmed correct patient, procedure, site, and patient consented.   Anesthesia Topical anesthesia was used. Anesthetic medications included Lidocaine  2%, Proparacaine 0.5%.   Procedure Preparation included 5% betadine to ocular surface, eyelid speculum. A (32g) needle was used.   Injection: 6 mg faricimab -svoa 6 MG/0.05ML   Route: Intravitreal, Site: Left Eye   NDC: 40981-191-47, Lot: [id number], Expiration date: 06/21/2024, Waste: 0 mL   Post-op Post injection exam found visual acuity of at least counting fingers. The patient tolerated the procedure well. There  were no complications. The patient received written and verbal post procedure care education. Post injection medications were not given.            ASSESSMENT/PLAN:    ICD-10-CM   1. Moderate nonproliferative diabetic retinopathy of both eyes with macular edema associated with type 2 diabetes mellitus (HCC)  E11.3313 OCT, Retina - OU - Both Eyes    Intravitreal Injection, Pharmacologic Agent - OD - Right Eye    Intravitreal Injection, Pharmacologic Agent - OS - Left Eye    faricimab -svoa (VABYSMO ) 6mg /0.66mL intravitreal injection    aflibercept  (EYLEA ) SOLN 2 mg    2. Current use of insulin  (HCC)  Z79.4     3. Long term (current) use of oral hypoglycemic drugs  Z79.84     4. Essential hypertension  I10     5. Hypertensive retinopathy of both eyes  H35.033     6. Nuclear sclerosis of both eyes  H25.13      1-3.  Moderate non-proliferative diabetic retinopathy w/ DME, OU  - delayed f/u -- 11.5 wks instead of 4 (10.25.24 to 01.15.25)  - A1c 9.1 on 03.20.25  - pt lost to f/u from 10.23.2019 to 11.18.20  - FA in 2019 showed leaking MA OU; no NV OU **of note, pt had pruritic rxn to fluorescein  dye -- resolved with 12.5 mg IV benadryl **  - FA 12.16.2020 shows late leaking MA's, non-perfusion defects; no NV OU - s/p IVA OD #1 (12.16.20), #2 (01.25.21), #3 (05.26.21), #4 (07.02.21), #5 (10.15.21), #6 (11.23.21), #7 (07.27.22), #8 (09.09.22), #9 (11.11.22), #10 (01.20.23), #11 (02.23.23), #12 (04.09.23), #13 (05.23.23), #14 (06.30.23), #15 (08.04.23), #16 (09.01.23), #17 (10.06.23), #18 (11.10.23) #19 (12.15.23), #20 (01.19.24), #21 (02.22.24), #22 (04.09.24), #23 (06.28.24), #24 (07.29.24), #25 (09.27.24), #26 (10.25.24), #27 (04.02.25) -- IVA resistance - s/p IVA OS #1 (12.23.20), #2 (01.27.21), #3 (03.02.21), #4 (05.26.21), #5 (07.02.21), #6 (10.15.21), #7 (11.23.21), #8 (01.11.22), #9 (03.04.22), #10 (04.27.22), #11 (11.14.22), #12 (01.10.23), #13 (02.20.23), #14 (04.05.23), #15  (05.19.23), #16 (06.23.23), #17 (07.28.23), #18 (09.01.23), #19 (10.06.23), #20 (11.10.23) #21 (12.15.23), #22 (01.19.24), #23 (02.22.24), #24 (04.01.24), #25 (04.30.24), #26 (05.28.24), #27 (06.28.24) -- IVA Resistance ======================================  - s/p focal laser OD (03.19.21), (04.01.22) ====================================== - s/p IVE OS #1 (07.29.24), #2 (08.30.24), #3 (09.24.24), #4 (10.25.24), #5 (01.15.25) -- IVE resistance ====================================== - s/p IVV OS #1 (02.26.25), #2 (03.26.25) ======================================  - exam shows scattered MA, IRH, exudates and mild macular edema OU  - BCVA OD 20/20 - stable; OS 20/20 from 20/25 - OCT shows OD: Mild interval increase in IRF/cystic changes nasal fovea and temporal macula, scattered punctate IRHM; OS: Interval improvement  in IRF / IRHM SN fovea and mac at 4+ wks **discussed decreased efficacy / resistance to Avastin  and potential benefit of switching medication**  - Recommend switching to IVE OD #1 and IVV OS #3 today, 05.02.25 w/ f/u in 4 wks  - pt wishes to proceed with injections - RBA of procedure discussed, questions answered - see procedure note  - Vabsymo informed consent form was signed and scanned on 02.26.25 (OU)  - Eylea  informed consent form was signed and scanned on 05.02.25 (OD)  - f/u 4 weeks, DFE, OCT, possible injection(s)  4,5. Hypertensive retinopathy OU  - discussed importance of tight BP control             - continue to monitor   6. Nuclear Sclerosis  - The symptoms of cataract, surgical options, and treatments and risks were discussed with patient.   - discussed diagnosis and progression  - under the expert management of [facility]   7. Dry eyes OU - recommend artificial tears and lubricating ointment as needed  Return in about 4 weeks (around 07/22/2023) for f/u NPDR OU, DFE, OCT, Possible Injxn.   Ophthalmic Meds Ordered this visit:  Meds ordered this encounter   Medications   faricimab -svoa (VABYSMO ) 6mg /0.68mL intravitreal injection   aflibercept  (EYLEA ) SOLN 2 mg     This document serves as a record of services personally performed by Jeanice Millard, MD, PhD. It was created on their behalf by Morley Arabia. Bevin Bucks, OA an ophthalmic technician. The creation of this record is the provider's dictation and/or activities during the visit.    Electronically signed by: Morley Arabia. Bevin Bucks, OA 06/24/23 9:44 PM  Jeanice Millard, M.D., Ph.D. Diseases & Surgery of the Retina and Vitreous [facility]  I have reviewed the above documentation for accuracy and completeness, and I agree with the above. Jeanice Millard, M.D., Ph.D. 06/24/23 9:53 PM   Abbreviations: M myopia (nearsighted); A astigmatism; H hyperopia (farsighted); P presbyopia; Mrx spectacle prescription;  CTL contact lenses; OD right eye; OS left eye; OU both eyes  XT exotropia; ET esotropia; PEK punctate epithelial keratitis; PEE punctate epithelial erosions; DES dry eye syndrome; MGD meibomian gland dysfunction; ATs artificial tears; PFAT's preservative free artificial tears; NSC nuclear sclerotic cataract; PSC posterior subcapsular cataract; ERM epi-retinal membrane; PVD posterior vitreous detachment; RD retinal detachment; DM diabetes mellitus; DR diabetic retinopathy; NPDR non-proliferative diabetic retinopathy; PDR proliferative diabetic retinopathy; CSME clinically significant macular edema; DME diabetic macular edema; dbh dot blot hemorrhages; CWS cotton wool spot; POAG primary open angle glaucoma; C/D cup-to-disc ratio; HVF humphrey visual field; GVF goldmann visual field; OCT optical coherence tomography; IOP intraocular pressure; BRVO Branch retinal vein occlusion; CRVO central retinal vein occlusion; CRAO central retinal artery occlusion; BRAO branch retinal artery occlusion; RT retinal tear; SB scleral buckle; PPV pars plana vitrectomy; VH Vitreous hemorrhage; PRP panretinal  laser photocoagulation; IVK intravitreal kenalog ; VMT vitreomacular traction; MH Macular hole;  NVD neovascularization of the disc; NVE neovascularization elsewhere; AREDS age related eye disease study; ARMD age related macular degeneration; POAG primary open angle glaucoma; EBMD epithelial/anterior basement membrane dystrophy; ACIOL anterior chamber intraocular lens; IOL intraocular lens; PCIOL posterior chamber intraocular lens; Phaco/IOL phacoemulsification with intraocular lens placement; PRK photorefractive keratectomy; LASIK laser assisted in situ keratomileusis; HTN hypertension; DM diabetes mellitus; COPD chronic obstructive pulmonary disease

## 2023-06-24 ENCOUNTER — Encounter (INDEPENDENT_AMBULATORY_CARE_PROVIDER_SITE_OTHER): Payer: Self-pay | Admitting: Ophthalmology

## 2023-06-24 ENCOUNTER — Ambulatory Visit (INDEPENDENT_AMBULATORY_CARE_PROVIDER_SITE_OTHER): Admitting: Ophthalmology

## 2023-06-24 DIAGNOSIS — I10 Essential (primary) hypertension: Secondary | ICD-10-CM

## 2023-06-24 DIAGNOSIS — H35033 Hypertensive retinopathy, bilateral: Secondary | ICD-10-CM

## 2023-06-24 DIAGNOSIS — Z7984 Long term (current) use of oral hypoglycemic drugs: Secondary | ICD-10-CM

## 2023-06-24 DIAGNOSIS — Z794 Long term (current) use of insulin: Secondary | ICD-10-CM

## 2023-06-24 DIAGNOSIS — E113313 Type 2 diabetes mellitus with moderate nonproliferative diabetic retinopathy with macular edema, bilateral: Secondary | ICD-10-CM | POA: Diagnosis not present

## 2023-06-24 DIAGNOSIS — H2513 Age-related nuclear cataract, bilateral: Secondary | ICD-10-CM

## 2023-06-24 MED ORDER — FARICIMAB-SVOA 6 MG/0.05ML IZ SOSY
6.0000 mg | PREFILLED_SYRINGE | INTRAVITREAL | Status: AC | PRN
  Administered 2023-06-24: 6 mg via INTRAVITREAL

## 2023-06-24 MED ORDER — AFLIBERCEPT 2MG/0.05ML IZ SOLN FOR KALEIDOSCOPE
2.0000 mg | INTRAVITREAL | Status: AC | PRN
  Administered 2023-06-24: 2 mg via INTRAVITREAL

## 2023-06-29 ENCOUNTER — Ambulatory Visit: Admitting: Family Medicine

## 2023-06-30 ENCOUNTER — Ambulatory Visit (HOSPITAL_BASED_OUTPATIENT_CLINIC_OR_DEPARTMENT_OTHER)
Admission: RE | Admit: 2023-06-30 | Discharge: 2023-06-30 | Disposition: A | Discharge status: Home / Self Care | Source: Ambulatory Visit | Attending: Sports Medicine | Admitting: Sports Medicine

## 2023-06-30 ENCOUNTER — Other Ambulatory Visit: Payer: Self-pay | Admitting: Sports Medicine

## 2023-06-30 ENCOUNTER — Ambulatory Visit: Admitting: Sports Medicine

## 2023-06-30 ENCOUNTER — Encounter: Payer: Self-pay | Admitting: Sports Medicine

## 2023-06-30 VITALS — BP 120/70 | Ht 62.0 in | Wt 300.0 lb

## 2023-06-30 DIAGNOSIS — M25551 Pain in right hip: Secondary | ICD-10-CM | POA: Insufficient documentation

## 2023-06-30 DIAGNOSIS — G8929 Other chronic pain: Secondary | ICD-10-CM | POA: Insufficient documentation

## 2023-06-30 DIAGNOSIS — M5441 Lumbago with sciatica, right side: Secondary | ICD-10-CM | POA: Diagnosis present

## 2023-06-30 NOTE — Progress Notes (Signed)
   Subjective:    Patient ID: Ana Long, female    DOB: 31-Oct-1975, 48 y.o.   MRN: [id number]  HPI chief complaint: Low back pain and right hip pain  Patient is a 48 year old female that presents with acute on chronic low back pain and right hip pain.  This has been ongoing for several years.  She suffered a fracture in the right hip in 2010.  Her pain is diffuse around the right hip.  She does have some pain in the right side of her low back as well.  She has been attending physical therapy for several weeks and has not noticed any benefit.  No recent trauma.  Past medical history reviewed Medications reviewed Allergies reviewed  Review of Systems As above    Objective:   Physical Exam  Lumbar spine: Limited range of motion secondary to body habitus.  She is tender to palpation diffusely around the right SI joint and right lower lumbar spine.  Right hip: There is some limited internal rotation which does reproduce hip pain.  Pain with resisted hip flexion as well.  Neurovascularly intact distally.  Walking with a limp.  X-rays of her lumbar spine and right hip are obtained.  Body habitus makes it very difficult to determine the amount of arthritic change in these areas.  Nothing acute is seen by the radiologist.      Assessment & Plan:   Acute on chronic low back and right hip pain  To better evaluate the degree of arthritis present I recommended a CT scan of both the lumbar spine and right hip.  I will follow-up with her via MyChart message with those results when available.  .dra

## 2023-07-11 ENCOUNTER — Ambulatory Visit: Admitting: Family Medicine

## 2023-07-12 ENCOUNTER — Ambulatory Visit (HOSPITAL_BASED_OUTPATIENT_CLINIC_OR_DEPARTMENT_OTHER)
Admission: RE | Admit: 2023-07-12 | Discharge: 2023-07-12 | Disposition: A | Discharge status: Home / Self Care | Source: Ambulatory Visit | Attending: Sports Medicine | Admitting: Sports Medicine

## 2023-07-12 DIAGNOSIS — G8929 Other chronic pain: Secondary | ICD-10-CM

## 2023-07-12 DIAGNOSIS — M25551 Pain in right hip: Secondary | ICD-10-CM | POA: Insufficient documentation

## 2023-07-12 DIAGNOSIS — M5441 Lumbago with sciatica, right side: Secondary | ICD-10-CM | POA: Diagnosis present

## 2023-07-20 ENCOUNTER — Ambulatory Visit: Admitting: Family Medicine

## 2023-07-21 ENCOUNTER — Telehealth: Admitting: Sports Medicine

## 2023-07-21 NOTE — Progress Notes (Signed)
 [facility] - Clinic Note  07/22/2023     CHIEF COMPLAINT Patient presents for Retina Follow Up  HISTORY OF PRESENT ILLNESS: Ana Long is a 48 y.o. female who presents to the clinic today for:   HPI     Retina Follow Up   Patient presents with  Diabetic Retinopathy.  In both eyes.  This started 4 weeks ago.  I, the attending physician,  performed the HPI with the patient and updated documentation appropriately.        Comments   Pt presents for 4 week retina f/u, NPDR OU. Patient states vision about the same. Pt has noticed a new floater in her left eye toward her nasal. Pt has some soreness in eyes but is assuming it is allergies. Pt is not using any ats. A1c= 9.0 about 2 months. BS= 201 this morning while fasting.      Last edited by Ronelle Coffee, MD on 07/22/2023  3:46 PM.     Patient states she's had a hard time with her sugar the past couple of weeks, she lost her Humalin after picking it up from the pharmacy, she's been using Lantus , but her blood sugar has been over 300, she is seeing another new floater  Referring physician: Sidra Dredge MD [address] [place], [place] [address]  HISTORICAL INFORMATION:   Selected notes from the MEDICAL RECORD NUMBER Re-referred by Dr. Diedre Fox for DM exam   CURRENT MEDICATIONS: Current Outpatient Medications (Ophthalmic Drugs)  Medication Sig   Olopatadine HCl 0.2 % SOLN Place one drop into both eyes daily.   No current facility-administered medications for this visit. (Ophthalmic Drugs)   Current Outpatient Medications (Other)  Medication Sig   acyclovir  (ZOVIRAX ) 400 MG tablet TAKE 1 TABLET BY MOUTH TWICE A DAY   albuterol  (PROAIR  HFA) 108 (90 Base) MCG/ACT inhaler Inhale 2 puffs into the lungs every 4 (four) hours as needed for wheezing or shortness of breath (cough). (Patient taking differently: Inhale 2 puffs into the lungs every 4 (four) hours as needed for wheezing or shortness of  breath (or coughing).)   atorvastatin  (LIPITOR) 10 MG tablet Take 10 mg by mouth daily.   benzonatate  (TESSALON ) 200 MG capsule Take 1 capsule (200 mg total) by mouth 3 (three) times daily as needed for cough.   CIPRODEX OTIC suspension Place into the right ear.   cyclobenzaprine  (FLEXERIL ) 10 MG tablet Take 1 tablet (10 mg total) by mouth 3 (three) times daily as needed for muscle spasms.   DERMA-SMOOTHE/FS SCALP 0.01 % OIL Apply topically.   diclofenac  (VOLTAREN ) 75 MG EC tablet Take 1 tablet (75 mg total) by mouth 2 (two) times daily.   fluconazole  (DIFLUCAN ) 150 MG tablet TAKE 1 TABLET BY MOUTH NOW. THEN REPEAT IN 1 WEEK   furosemide  (LASIX ) 20 MG tablet Take 20 mg by mouth daily as needed for fluid or edema.   HUMULIN  R U-500 KWIKPEN 500 UNIT/ML kwikpen Inject into the skin.   hydrochlorothiazide (HYDRODIURIL) 12.5 MG tablet Take by mouth.   Insulin  Pen Needle (B-D UF III MINI PEN NEEDLES) 31G X 5 MM MISC USE AS DIRECTED TO INJECT INSULIN  FIVE A DAY E11.65   lactulose  (CEPHULAC ) 10 g packet Take 1 packet (10 g total) by mouth daily as needed. Reported on 07/29/2015 (Patient taking differently: Take 10 g by mouth daily as needed (for constipation).)   loratadine  (CLARITIN ) 10 MG tablet Take 1 tablet (10 mg total) by mouth  2 (two) times daily.   mometasone -formoterol  (DULERA ) 200-5 MCG/ACT AERO Inhale 2 puffs into the lungs 2 (two) times daily.   montelukast  (SINGULAIR ) 10 MG tablet Take 1 tablet (10 mg total) by mouth at bedtime.   nitroGLYCERIN  (NITRODUR - DOSED IN MG/24 HR) 0.2 mg/hr patch Apply 1/4th patch to affected achilles, change daily   omeprazole  (PRILOSEC) 20 MG capsule Take 1 capsule (20 mg total) by mouth daily before breakfast.   SPIRIVA RESPIMAT 1.25 MCG/ACT AERS SMARTSIG:2 Puff(s) Via Inhaler Daily   sulfamethoxazole -trimethoprim  (BACTRIM  DS) 800-160 MG tablet Take 1 tablet by mouth 2 (two) times daily.   topiramate  (TOPAMAX ) 25 MG tablet Take by mouth.   Blood Glucose  Monitoring Suppl (ACCU-CHEK AVIVA PLUS) w/Device KIT Please check you blood sugars 4 times a day (Patient not taking: Reported on 05/18/2023)   glucose blood (ACCU-CHEK SMARTVIEW) test strip TEST FOUR TIMES DAILY (Patient not taking: Reported on 05/18/2023)   Lancets (ACCU-CHEK SOFT TOUCH) lancets Use as instructed (Patient not taking: Reported on 05/18/2023)   Current Facility-Administered Medications (Other)  Medication Route   diphenhydrAMINE  (BENADRYL ) injection 12.5 mg Intramuscular   REVIEW OF SYSTEMS: ROS   Positive for: Endocrine, Eyes, Respiratory Negative for: Constitutional, Gastrointestinal, Neurological, Skin, Genitourinary, Musculoskeletal, HENT, Cardiovascular, Psychiatric, Allergic/Imm, Heme/Lymph Last edited by Carrington Clack, COT on 07/22/2023  1:32 PM.      ALLERGIES Allergies  Allergen Reactions   Bee Venom Anaphylaxis and Swelling   Wasp Venom Anaphylaxis and Swelling   Citrus Hives and Itching   Latex Hives and Swelling   Penicillins Hives and Itching    Has patient had a PCN reaction causing immediate rash, facial/tongue/throat swelling, SOB or lightheadedness with hypotension: Yes Has patient had a PCN reaction causing severe rash involving mucus membranes or skin necrosis: Unk Has patient had a PCN reaction that required hospitalization: Was already in hosp Has patient had a PCN reaction occurring within the last 10 years: No If all of the above answers are "NO", then may proceed with Cephalosporin use.    Shellfish Allergy Other (See Comments) and Swelling    Patient is uncertain; stated that she tolerates this (??)   Soap Hives, Itching and Swelling    PUREX LAUNDRY DETERGENT   Tomato Hives and Itching   Banana Itching and Nausea Only   Chocolate Itching   Fluticasone  Other (See Comments)    Per patient, makes sneeze and gag   Hydrocodone  Itching and Nausea Only   Other Itching    Acidic foods- Itching [place]- Itching in the roof of the mouth    Adhesive [Tape] Itching and Rash    Allergic to  "leads"   Doxycycline Other (See Comments)    AFFECTS JOINTS   Liraglutide Nausea Only   Metformin  And Related Nausea Only, Rash, Other (See Comments) and Diarrhea    GI UPSET, also   Red Dye #40 (Allura Red) Itching, Rash and Other (See Comments)    At eye doctor's office, the dilating drops- Skin breaks out, chest turned red, itching, face became swollen   PAST MEDICAL HISTORY 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   Cataract    NS OU   Complication of anesthesia    states is  hard to wake up post-op   Constipation 12/30/2014   Diabetes mellitus    intolerant to Metformin    Diabetic retinopathy (HCC)    NPDR OU   Dry skin    hands    Family history of anesthesia complication    mother went into coma during c-section   Genital herpes 01/01/2006   on acyclovir  BID    GERD (gastroesophageal reflux disease)    Herpes genital    Hyperglycemia 12/26/2017   Hypertensive retinopathy    OU   Low back pain 11/22/2012   Lower extremity edema 10/18/2012   Menorrhagia 11/06/2015   Morbid obesity with BMI of 50 to 60 07/23/2009   Obesity hypoventilation syndrome (HCC) 10/30/2012   Obesity, morbid (HCC) 01/10/2014   OSA on CPAP     AHI was on  11-17-12 to 120/hr, titrated to 15 cm with 3 cm EPR/    PCOS (polycystic ovarian syndrome)    Rheumatoid arthritis (HCC)    Right ankle pain 06/19/2015   Right hip pain 09/04/2017   Right shoulder pain 06/01/2017   Seasonal allergies    Sleep apnea, obstructive    uses CPAP nightly - had sleep study 08/22/2007   Past Surgical History:  Procedure Laterality Date   CARPAL TUNNEL RELEASE Right 10/02/2012   Procedure: RIGHT CARPAL TUNNEL RELEASE ENDOSCOPIC;   Surgeon: Sheryl Donna, MD;  Location: [facility];  Service: Orthopedics;  Laterality: Right;   CHOLECYSTECTOMY N/A 12/02/2014   Procedure: LAPAROSCOPIC CHOLECYSTECTOMY;  Surgeon: Oralee Billow, MD;  Location: [facility];  Service: General;  Laterality: N/A;   HYSTEROSCOPY WITH D & C  01-08-2002   FAMILY HISTORY Family History  Problem Relation Age of Onset   Other Mother    Diabetes Maternal Aunt    Heart attack Maternal Aunt    Alzheimer's disease Maternal Uncle    Cancer Maternal Uncle        prostate   Diabetes Paternal Aunt    Cancer Paternal Aunt        brain,mouth   Alzheimer's disease Paternal Aunt    Colon polyps Paternal Uncle    Diabetes Paternal Uncle    Diabetes Maternal Grandmother    Diabetes Maternal Grandfather    Leukemia Cousin    Other Other    Obesity Other    Other Other    Colitis Neg Hx    SOCIAL HISTORY Social History   Tobacco Use   Smoking status: Former    Current packs/day: 0.00    Types: Cigarettes    Start date: 05/23/2000    Quit date: 05/24/2010    Years since quitting: 13.1   Smokeless tobacco: Never  Vaping Use   Vaping status: Never Used  Substance Use Topics   Alcohol use: Yes    Alcohol/week: 0.0 standard drinks of alcohol    Comment: socially   Drug use: No       OPHTHALMIC EXAM: Base Eye Exam     Visual Acuity (Snellen - Linear)       Right Left   Dist cc 20/25 -2 20/25 -2   Dist ph cc 20/20 20/25    Correction: Glasses         Tonometry (Tonopen, 1:37 PM)       Right Left   Pressure 20 18         Pupils       Pupils Dark Light Shape React APD   Right PERRL 4 3 Round Brisk None  Left PERRL 4 3 Round Brisk None         Visual Fields       Left Right    Full Full         Extraocular Movement       Right Left    Full, Ortho Full, Ortho         Neuro/Psych     Oriented x3: Yes   Mood/Affect: Normal         Dilation     Both eyes: 1.0% Mydriacyl, 2.5% Phenylephrine @  1:38 PM           Slit Lamp and Fundus Exam     Slit Lamp Exam       Right Left   Lids/Lashes Dermatochalasis - upper lid, mild Meibomian gland dysfunction Dermatochalasis - upper lid, mild Meibomian gland dysfunction   Conjunctiva/Sclera White and quiet White and quiet   Cornea 2+Punctate epithelial erosions, mild tear film debris 3+fine Punctate epithelial erosions   Anterior Chamber deep and quiet deep and quiet   Iris round and dilated, No NVI round and dilated   Lens 1+ NS, 1+CC 1+NS, 1+CC   Anterior Vitreous mild vitreous syneresis; PVD; mild vit condensations mild vitreous syneresis, scattered vitreous condensations, silicone oil micro bubbles trapped within vit         Fundus Exam       Right Left   Disc Pink and sharp, +cupping Pink and sharp, +cupping   C/D Ratio 0.7 0.6   Macula good foveal reflex, focal exudates ST macula -- improved, scattered MA - improved, cluster of IRH and edema temporal macula -- improving, focal laser changes inferior macula good foveal reflex, scatttered MA's and exudate, central cystic changes -- improved, focal cluster of exudates SN macula -- improving   Vessels attenuated, Tortuous attenuated, Tortuous   Periphery Attached, scattered MA/exudates greatest inferiorly, faint CWS nasal midzone Attached, scattered MA and exudates greatest posteriorly, white without pressure temporally           Refraction     Wearing Rx       Sphere Cylinder Axis   Right -4.25 +0.75 021   Left -4.25 +1.00 042    Type: SVL           IMAGING AND PROCEDURES  Imaging and Procedures for @TODAY @  OCT, Retina - OU - Both Eyes        Right Eye Quality was good. Central Foveal Thickness: 221. Progression has improved. Findings include normal foveal contour, no IRF, no SRF, intraretinal hyper-reflective material, vitreomacular adhesion (Mild interval improvement in IRF/cystic changes temporal macula, scattered punctate IRHM ).   Left Eye Quality  was good. Central Foveal Thickness: 254. Progression has improved. Findings include no SRF, abnormal foveal contour, intraretinal hyper-reflective material, intraretinal fluid (Interval improvement in IRF / IRHM SN fovea and mac).   Notes  *Images captured and stored on drive  Diagnosis / Impression:  +DME OU OD: Mild interval improvement in IRF/cystic changes nasal fovea and temporal macula, scattered punctate IRHM  OS: Interval improvement in IRF / IRHM SN fovea and mac  Clinical management:  See below  Abbreviations: NFP - Normal foveal profile. CME - cystoid macular edema. PED - pigment epithelial detachment. IRF - intraretinal fluid. SRF - subretinal fluid. EZ - ellipsoid zone. ERM - epiretinal membrane. ORA - outer retinal atrophy. ORT - outer retinal tubulation. SRHM - subretinal hyper-reflective material      Intravitreal Injection, Pharmacologic Agent - OD -  Right Eye       Time Out 07/22/2023. 2:10 PM. Confirmed correct patient, procedure, site, and patient consented.   Anesthesia Topical anesthesia was used. Anesthetic medications included Lidocaine  2%, Proparacaine 0.5%.   Procedure Preparation included 5% betadine to ocular surface, eyelid speculum. A (32g) needle was used.   Injection: 2 mg aflibercept  2 MG/0.05ML   Route: Intravitreal, Site: Right Eye   NDC: D2246706, Lot: [id number], Expiration date: 09/20/2024, Waste: 0 mL   Post-op Post injection exam found visual acuity of at least counting fingers. The patient tolerated the procedure well. There were no complications. The patient received written and verbal post procedure care education. Post injection medications were not given.      Intravitreal Injection, Pharmacologic Agent - OS - Left Eye       Time Out 07/22/2023. 2:10 PM. Confirmed correct patient, procedure, site, and patient consented.   Anesthesia Topical anesthesia was used. Anesthetic medications included Lidocaine  2%, Proparacaine  0.5%.   Procedure Preparation included 5% betadine to ocular surface, eyelid speculum. A (32g) needle was used.   Injection: 6 mg faricimab -svoa 6 MG/0.05ML   Route: Intravitreal, Site: Left Eye   NDC: 09811-914-78, Lot: [id number], Expiration date: 07/21/2024, Waste: 0 mL   Post-op Post injection exam found visual acuity of at least counting fingers. The patient tolerated the procedure well. There were no complications. The patient received written and verbal post procedure care education. Post injection medications were not given.             ASSESSMENT/PLAN:    ICD-10-CM   1. Moderate nonproliferative diabetic retinopathy of both eyes with macular edema associated with type 2 diabetes mellitus (HCC)  E11.3313 OCT, Retina - OU - Both Eyes    Intravitreal Injection, Pharmacologic Agent - OD - Right Eye    Intravitreal Injection, Pharmacologic Agent - OS - Left Eye    faricimab -svoa (VABYSMO ) 6mg /0.11mL intravitreal injection    aflibercept  (EYLEA ) SOLN 2 mg    2. Current use of insulin  (HCC)  Z79.4     3. Long term (current) use of oral hypoglycemic drugs  Z79.84     4. Essential hypertension  I10     5. Hypertensive retinopathy of both eyes  H35.033     6. Nuclear sclerosis of both eyes  H25.13     7. Dry eyes  H04.123       1-3.  Moderate non-proliferative diabetic retinopathy w/ DME, OU  - delayed f/u -- 11.5 wks instead of 4 (10.25.24 to 01.15.25)  - A1c 9.1 on 03.20.25  - pt lost to f/u from 10.23.2019 to 11.18.20  - FA in 2019 showed leaking MA OU; no NV OU **of note, pt had pruritic rxn to fluorescein  dye -- resolved with 12.5 mg IV benadryl **  - FA 12.16.2020 shows late leaking MA's, non-perfusion defects; no NV OU - s/p IVA OD #1 (12.16.20), #2 (01.25.21), #3 (05.26.21), #4 (07.02.21), #5 (10.15.21), #6 (11.23.21), #7 (07.27.22), #8 (09.09.22), #9 (11.11.22), #10 (01.20.23), #11 (02.23.23), #12 (04.09.23), #13 (05.23.23), #14 (06.30.23), #15 (08.04.23), #16  (09.01.23), #17 (10.06.23), #18 (11.10.23) #19 (12.15.23), #20 (01.19.24), #21 (02.22.24), #22 (04.09.24), #23 (06.28.24), #24 (07.29.24), #25 (09.27.24), #26 (10.25.24), #27 (04.02.25) -- IVA resistance - s/p IVA OS #1 (12.23.20), #2 (01.27.21), #3 (03.02.21), #4 (05.26.21), #5 (07.02.21), #6 (10.15.21), #7 (11.23.21), #8 (01.11.22), #9 (03.04.22), #10 (04.27.22), #11 (11.14.22), #12 (01.10.23), #13 (02.20.23), #14 (04.05.23), #15 (05.19.23), #16 (06.23.23), #17 (07.28.23), #18 (09.01.23), #19 (10.06.23), #20 (11.10.23) #21 (12.15.23), #22 (01.19.24), #  23 (02.22.24), #24 (04.01.24), #25 (04.30.24), #26 (05.28.24), #27 (06.28.24) -- IVA Resistance - s/p IVE OD #1 (05.02.25) ======================================  - s/p focal laser OD (03.19.21), (04.01.22) ====================================== - s/p IVE OS #1 (07.29.24), #2 (08.30.24), #3 (09.24.24), #4 (10.25.24), #5 (01.15.25) -- IVE resistance ====================================== - s/p IVV OS #1 (02.26.25), #2 (03.26.25), #3 (05.02.25) ======================================  - exam shows scattered MA, IRH, exudates and mild macular edema OU  - BCVA OD 20/20 - stable; OS 20/25 from 20/20 - OCT shows OD: Mild interval improvement in IRF/cystic changes nasal fovea and temporal macula, scattered punctate IRHM; OS: Interval improvement in IRF / IRHM SN fovea and mac at 4 wks - Recommend IVE OD #2 and IVV OS #4 today, 05.30.25 w/ f/u in 4 wks  - pt wishes to proceed with injections - RBA of procedure discussed, questions answered - see procedure note  - Vabsymo informed consent form was signed and scanned on 02.26.25 (OU)  - Eylea  informed consent form was signed and scanned on 05.02.25 (OD)  - f/u 4 weeks, DFE, OCT, possible injection(s)  4,5. Hypertensive retinopathy OU  - discussed importance of tight BP control             - continue to monitor   6. Nuclear Sclerosis  - The symptoms of cataract, surgical options, and treatments and  risks were discussed with patient.   - discussed diagnosis and progression  - under the expert management of [facility]   7. Dry eyes OU - recommend artificial tears and lubricating ointment as needed  Return in about 4 weeks (around 08/19/2023) for f/u NPDR OU, DFE, OCT, Possible Injxn.   Ophthalmic Meds Ordered this visit:  Meds ordered this encounter  Medications   faricimab -svoa (VABYSMO ) 6mg /0.15mL intravitreal injection   aflibercept  (EYLEA ) SOLN 2 mg    This document serves as a record of services personally performed by Jeanice Millard, MD, PhD. It was created on their behalf by Eller Gut COT, an ophthalmic technician. The creation of this record is the provider's dictation and/or activities during the visit.    Electronically signed by: Eller Gut COT 05.29.25  3:54 PM  This document serves as a record of services personally performed by Jeanice Millard, MD, PhD. It was created on their behalf by Morley Arabia. Bevin Bucks, OA an ophthalmic technician. The creation of this record is the provider's dictation and/or activities during the visit.    Electronically signed by: Morley Arabia. Bevin Bucks, OA 07/22/23 3:54 PM  Jeanice Millard, M.D., Ph.D. Diseases & Surgery of the Retina and Vitreous [facility] 07/22/2023   I have reviewed the above documentation for accuracy and completeness, and I agree with the above. Jeanice Millard, M.D., Ph.D. 07/22/23 3:54 PM   Abbreviations: M myopia (nearsighted); A astigmatism; H hyperopia (farsighted); P presbyopia; Mrx spectacle prescription;  CTL contact lenses; OD right eye; OS left eye; OU both eyes  XT exotropia; ET esotropia; PEK punctate epithelial keratitis; PEE punctate epithelial erosions; DES dry eye syndrome; MGD meibomian gland dysfunction; ATs artificial tears; PFAT's preservative free artificial tears; NSC nuclear sclerotic cataract; PSC posterior subcapsular cataract; ERM epi-retinal membrane; PVD  posterior vitreous detachment; RD retinal detachment; DM diabetes mellitus; DR diabetic retinopathy; NPDR non-proliferative diabetic retinopathy; PDR proliferative diabetic retinopathy; CSME clinically significant macular edema; DME diabetic macular edema; dbh dot blot hemorrhages; CWS cotton wool spot; POAG primary open angle glaucoma; C/D cup-to-disc ratio; HVF humphrey visual field; GVF goldmann visual field; OCT  optical coherence tomography; IOP intraocular pressure; BRVO Branch retinal vein occlusion; CRVO central retinal vein occlusion; CRAO central retinal artery occlusion; BRAO branch retinal artery occlusion; RT retinal tear; SB scleral buckle; PPV pars plana vitrectomy; VH Vitreous hemorrhage; PRP panretinal laser photocoagulation; IVK intravitreal kenalog ; VMT vitreomacular traction; MH Macular hole;  NVD neovascularization of the disc; NVE neovascularization elsewhere; AREDS age related eye disease study; ARMD age related macular degeneration; POAG primary open angle glaucoma; EBMD epithelial/anterior basement membrane dystrophy; ACIOL anterior chamber intraocular lens; IOL intraocular lens; PCIOL posterior chamber intraocular lens; Phaco/IOL phacoemulsification with intraocular lens placement; PRK photorefractive keratectomy; LASIK laser assisted in situ keratomileusis; HTN hypertension; DM diabetes mellitus; COPD chronic obstructive pulmonary disease

## 2023-07-22 ENCOUNTER — Encounter (INDEPENDENT_AMBULATORY_CARE_PROVIDER_SITE_OTHER): Payer: Self-pay | Admitting: Ophthalmology

## 2023-07-22 ENCOUNTER — Ambulatory Visit (INDEPENDENT_AMBULATORY_CARE_PROVIDER_SITE_OTHER): Admitting: Ophthalmology

## 2023-07-22 DIAGNOSIS — H2513 Age-related nuclear cataract, bilateral: Secondary | ICD-10-CM

## 2023-07-22 DIAGNOSIS — I10 Essential (primary) hypertension: Secondary | ICD-10-CM | POA: Diagnosis not present

## 2023-07-22 DIAGNOSIS — H04123 Dry eye syndrome of bilateral lacrimal glands: Secondary | ICD-10-CM

## 2023-07-22 DIAGNOSIS — Z794 Long term (current) use of insulin: Secondary | ICD-10-CM | POA: Diagnosis not present

## 2023-07-22 DIAGNOSIS — Z7984 Long term (current) use of oral hypoglycemic drugs: Secondary | ICD-10-CM

## 2023-07-22 DIAGNOSIS — E113313 Type 2 diabetes mellitus with moderate nonproliferative diabetic retinopathy with macular edema, bilateral: Secondary | ICD-10-CM | POA: Diagnosis not present

## 2023-07-22 DIAGNOSIS — H35033 Hypertensive retinopathy, bilateral: Secondary | ICD-10-CM

## 2023-07-22 MED ORDER — AFLIBERCEPT 2MG/0.05ML IZ SOLN FOR KALEIDOSCOPE
2.0000 mg | INTRAVITREAL | Status: AC | PRN
  Administered 2023-07-22: 2 mg via INTRAVITREAL

## 2023-07-22 MED ORDER — FARICIMAB-SVOA 6 MG/0.05ML IZ SOSY
6.0000 mg | PREFILLED_SYRINGE | INTRAVITREAL | Status: AC | PRN
  Administered 2023-07-22: 6 mg via INTRAVITREAL

## 2023-07-27 ENCOUNTER — Encounter: Payer: Self-pay | Admitting: Sports Medicine

## 2023-07-27 ENCOUNTER — Telehealth (INDEPENDENT_AMBULATORY_CARE_PROVIDER_SITE_OTHER): Admitting: Sports Medicine

## 2023-07-27 VITALS — Ht 62.0 in | Wt 300.0 lb

## 2023-07-27 DIAGNOSIS — M47819 Spondylosis without myelopathy or radiculopathy, site unspecified: Secondary | ICD-10-CM

## 2023-07-27 NOTE — Progress Notes (Signed)
 Patient ID: Ana Long, female   DOB: [birth year], 48 y.o.   MRN: [id number]  Telephone note, not a video visit.   I spoke with the patient on the phone today to discuss CT findings of her right hip and lumbar spine.  Right hip CT shows only mild arthritic changes.  CT of her lumbar spine shows multilevel facet arthropathy, most advanced at L4-L5.  This is likely her pain generator.  Patient has previously been seen by [facility] pain management and I recommended a return visit to them to discuss the possibility of facet injections.  We also discussed the possibility of an IM Depo-Medrol  injection for temporary pain relief if she is not able to schedule with them for several weeks.  Otherwise, follow-up with me as needed.  This note was dictated using Dragon naturally speaking software and may contain errors in syntax, spelling, or content which have not been identified prior to signing this note.

## 2023-08-18 NOTE — Progress Notes (Signed)
 [facility] - Clinic Note  08/19/2023     CHIEF COMPLAINT Patient presents for Retina Follow Up  HISTORY OF PRESENT ILLNESS: Ana Long is a 48 y.o. female who presents to the clinic today for:   HPI     Retina Follow Up   Patient presents with  Diabetic Retinopathy.  In both eyes.  This started 4 weeks ago.  I, the attending physician,  performed the HPI with the patient and updated documentation appropriately.        Comments   Patient here for 4 weeks retina follow up for NPDR OU. Patient states vision about the same. Has new floaty in OS.       Last edited by Valdemar Rogue, MD on 08/19/2023  4:21 PM.      Patient states has a new inferior floater in her left eye, the right eye is doing okay, both eyes are watering  Referring physician: Octavia Charlie Hamilton MD [address] [place], [place] [address]  HISTORICAL INFORMATION:   Selected notes from the MEDICAL RECORD NUMBER Re-referred by Dr. Hamilton Octavia for DM exam   CURRENT MEDICATIONS: Current Outpatient Medications (Ophthalmic Drugs)  Medication Sig   Olopatadine HCl 0.2 % SOLN Place one drop into both eyes daily.   No current facility-administered medications for this visit. (Ophthalmic Drugs)   Current Outpatient Medications (Other)  Medication Sig   acyclovir  (ZOVIRAX ) 400 MG tablet TAKE 1 TABLET BY MOUTH TWICE A DAY   albuterol  (PROAIR  HFA) 108 (90 Base) MCG/ACT inhaler Inhale 2 puffs into the lungs every 4 (four) hours as needed for wheezing or shortness of breath (cough). (Patient taking differently: Inhale 2 puffs into the lungs every 4 (four) hours as needed for wheezing or shortness of breath (or coughing).)   atorvastatin  (LIPITOR) 10 MG tablet Take 10 mg by mouth daily.   benzonatate  (TESSALON ) 200 MG capsule Take 1 capsule (200 mg total) by mouth 3 (three) times daily as needed for cough.   CIPRODEX OTIC suspension Place into the right ear.   cyclobenzaprine  (FLEXERIL ) 10 MG  tablet Take 1 tablet (10 mg total) by mouth 3 (three) times daily as needed for muscle spasms.   DERMA-SMOOTHE/FS SCALP 0.01 % OIL Apply topically.   diclofenac  (VOLTAREN ) 75 MG EC tablet Take 1 tablet (75 mg total) by mouth 2 (two) times daily.   fluconazole  (DIFLUCAN ) 150 MG tablet TAKE 1 TABLET BY MOUTH NOW. THEN REPEAT IN 1 WEEK   furosemide  (LASIX ) 20 MG tablet Take 20 mg by mouth daily as needed for fluid or edema.   HUMULIN  R U-500 KWIKPEN 500 UNIT/ML kwikpen Inject into the skin.   hydrochlorothiazide (HYDRODIURIL) 12.5 MG tablet Take by mouth.   Insulin  Pen Needle (B-D UF III MINI PEN NEEDLES) 31G X 5 MM MISC USE AS DIRECTED TO INJECT INSULIN  FIVE A DAY E11.65   lactulose  (CEPHULAC ) 10 g packet Take 1 packet (10 g total) by mouth daily as needed. Reported on 07/29/2015 (Patient taking differently: Take 10 g by mouth daily as needed (for constipation).)   loratadine  (CLARITIN ) 10 MG tablet Take 1 tablet (10 mg total) by mouth 2 (two) times daily.   mometasone -formoterol  (DULERA ) 200-5 MCG/ACT AERO Inhale 2 puffs into the lungs 2 (two) times daily.   montelukast  (SINGULAIR ) 10 MG tablet Take 1 tablet (10 mg total) by mouth at bedtime.   nitroGLYCERIN  (NITRODUR - DOSED IN MG/24 HR) 0.2 mg/hr patch Apply 1/4th patch to affected achilles,  change daily   omeprazole  (PRILOSEC) 20 MG capsule Take 1 capsule (20 mg total) by mouth daily before breakfast.   SPIRIVA RESPIMAT 1.25 MCG/ACT AERS SMARTSIG:2 Puff(s) Via Inhaler Daily   sulfamethoxazole -trimethoprim  (BACTRIM  DS) 800-160 MG tablet Take 1 tablet by mouth 2 (two) times daily.   topiramate  (TOPAMAX ) 25 MG tablet Take by mouth.   Blood Glucose Monitoring Suppl (ACCU-CHEK AVIVA PLUS) w/Device KIT Please check you blood sugars 4 times a day (Patient not taking: Reported on 08/19/2023)   glucose blood (ACCU-CHEK SMARTVIEW) test strip TEST FOUR TIMES DAILY (Patient not taking: Reported on 08/19/2023)   Lancets (ACCU-CHEK SOFT TOUCH) lancets Use as  instructed (Patient not taking: Reported on 08/19/2023)   Current Facility-Administered Medications (Other)  Medication Route   diphenhydrAMINE  (BENADRYL ) injection 12.5 mg Intramuscular   REVIEW OF SYSTEMS: ROS   Positive for: Endocrine, Eyes, Respiratory Negative for: Constitutional, Gastrointestinal, Neurological, Skin, Genitourinary, Musculoskeletal, HENT, Cardiovascular, Psychiatric, Allergic/Imm, Heme/Lymph Last edited by Orval Asberry RAMAN, COA on 08/19/2023  1:52 PM.       ALLERGIES Allergies  Allergen Reactions   Bee Venom Anaphylaxis and Swelling   Wasp Venom Anaphylaxis and Swelling   Citrus Hives and Itching   Latex Hives and Swelling   Penicillins Hives and Itching    Has patient had a PCN reaction causing immediate rash, facial/tongue/throat swelling, SOB or lightheadedness with hypotension: Yes Has patient had a PCN reaction causing severe rash involving mucus membranes or skin necrosis: Unk Has patient had a PCN reaction that required hospitalization: Was already in hosp Has patient had a PCN reaction occurring within the last 10 years: No If all of the above answers are NO, then may proceed with Cephalosporin use.    Shellfish Allergy Other (See Comments) and Swelling    Patient is uncertain; stated that she tolerates this (??)   Soap Hives, Itching and Swelling    PUREX LAUNDRY DETERGENT   Tomato Hives and Itching   Banana Itching and Nausea Only   Chocolate Itching   Fluticasone  Other (See Comments)    Per patient, makes sneeze and gag   Hydrocodone  Itching and Nausea Only   Other Itching    Acidic foods- Itching [place]- Itching in the roof of the mouth   Adhesive [Tape] Itching and Rash    Allergic to  leads   Doxycycline Other (See Comments)    AFFECTS JOINTS   Liraglutide Nausea Only   Metformin  And Related Nausea Only, Rash, Other (See Comments) and Diarrhea    GI UPSET, also   Red Dye #40 (Allura Red) Itching, Rash and Other (See Comments)     At eye doctor's office, the dilating drops- Skin breaks out, chest turned red, itching, face became swollen   PAST MEDICAL HISTORY 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   Cataract    NS OU   Complication of anesthesia    states is hard to wake up post-op   Constipation 12/30/2014   Diabetes mellitus    intolerant to Metformin    Diabetic retinopathy (HCC)    NPDR OU   Dry skin    hands    Family history of anesthesia complication    mother went into coma during c-section  Genital herpes 01/01/2006   on acyclovir  BID    GERD (gastroesophageal reflux disease)    Herpes genital    Hyperglycemia 12/26/2017   Hypertensive retinopathy    OU   Low back pain 11/22/2012   Lower extremity edema 10/18/2012   Menorrhagia 11/06/2015   Morbid obesity with BMI of 50 to 60 07/23/2009   Obesity hypoventilation syndrome (HCC) 10/30/2012   Obesity, morbid (HCC) 01/10/2014   OSA on CPAP     AHI was on  11-17-12 to 120/hr, titrated to 15 cm with 3 cm EPR/    PCOS (polycystic ovarian syndrome)    Rheumatoid arthritis (HCC)    Right ankle pain 06/19/2015   Right hip pain 09/04/2017   Right shoulder pain 06/01/2017   Seasonal allergies    Sleep apnea, obstructive    uses CPAP nightly - had sleep study 08/22/2007   Past Surgical History:  Procedure Laterality Date   CARPAL TUNNEL RELEASE Right 10/02/2012   Procedure: RIGHT CARPAL TUNNEL RELEASE ENDOSCOPIC;  Surgeon: Alm DELENA Hummer, MD;  Location: [facility];  Service: Orthopedics;  Laterality: Right;   CHOLECYSTECTOMY N/A 12/02/2014   Procedure: LAPAROSCOPIC CHOLECYSTECTOMY;  Surgeon: Krystal Spinner, MD;  Location: [facility];  Service: General;  Laterality: N/A;   HYSTEROSCOPY WITH D & C   01-08-2002   FAMILY HISTORY Family History  Problem Relation Age of Onset   Other Mother    Diabetes Maternal Aunt    Heart attack Maternal Aunt    Alzheimer's disease Maternal Uncle    Cancer Maternal Uncle        prostate   Diabetes Paternal Aunt    Cancer Paternal Aunt        brain,mouth   Alzheimer's disease Paternal Aunt    Colon polyps Paternal Uncle    Diabetes Paternal Uncle    Diabetes Maternal Grandmother    Diabetes Maternal Grandfather    Leukemia Cousin    Other Other    Obesity Other    Other Other    Colitis Neg Hx    SOCIAL HISTORY Social History   Tobacco Use   Smoking status: Former    Current packs/day: 0.00    Types: Cigarettes    Start date: 05/23/2000    Quit date: 05/24/2010    Years since quitting: 13.2   Smokeless tobacco: Never  Vaping Use   Vaping status: Never Used  Substance Use Topics   Alcohol use: Yes    Alcohol/week: 0.0 standard drinks of alcohol    Comment: socially   Drug use: No       OPHTHALMIC EXAM: Base Eye Exam     Visual Acuity (Snellen - Linear)       Right Left   Dist cc 20/20 -2 20/25 -2   Dist ph cc  20/20 -1         Tonometry (Tonopen, 1:50 PM)       Right Left   Pressure 16 19         Pupils       Dark Light Shape React APD   Right 4 3 Round Brisk None   Left 4 3 Round Brisk None         Visual Fields (Counting fingers)       Left Right    Full Full         Extraocular Movement       Right Left    Full, Ortho Full, Ortho  Neuro/Psych     Oriented x3: Yes   Mood/Affect: Normal         Dilation     Both eyes: 1.0% Mydriacyl, 2.5% Phenylephrine @ 1:50 PM           Slit Lamp and Fundus Exam     Slit Lamp Exam       Right Left   Lids/Lashes Dermatochalasis - upper lid, mild Meibomian gland dysfunction Dermatochalasis - upper lid, mild Meibomian gland dysfunction   Conjunctiva/Sclera White and quiet White and quiet   Cornea 2+Punctate epithelial erosions,  mild tear film debris 3+fine Punctate epithelial erosions   Anterior Chamber deep and quiet deep and quiet   Iris round and dilated, No NVI round and dilated   Lens 1+ NS, 1+CC 1+NS, 1+CC   Anterior Vitreous mild vitreous syneresis; PVD; mild vit condensations mild vitreous syneresis, scattered vitreous condensations, silicone oil micro bubbles trapped within vit         Fundus Exam       Right Left   Disc Pink and sharp, +cupping Pink and sharp, +cupping   C/D Ratio 0.7 0.6   Macula good foveal reflex, focal exudates ST macula -- improved, scattered MA - improved, edema and punctate exudates temporal macula, focal laser changes inferior macula good foveal reflex, scatttered MA's and exudates, central cystic changes -- improved, focal cluster of exudates SN macula -- improving   Vessels attenuated, Tortuous attenuated, Tortuous   Periphery Attached, scattered MA/exudates greatest inferiorly, faint CWS nasal midzone Attached, scattered MA and exudates greatest posteriorly, white without pressure temporally           Refraction     Wearing Rx       Sphere Cylinder Axis   Right -4.25 +0.75 021   Left -4.25 +1.00 042    Type: SVL           IMAGING AND PROCEDURES  Imaging and Procedures for @TODAY @  OCT, Retina - OU - Both Eyes       Right Eye Quality was good. Central Foveal Thickness: 221. Progression has been stable. Findings include normal foveal contour, no IRF, no SRF, intraretinal hyper-reflective material, vitreomacular adhesion (Persistent IRF/cystic changes temporal macula, scattered punctate IRHM ).   Left Eye Quality was good. Central Foveal Thickness: 240. Progression has improved. Findings include normal foveal contour, no SRF, intraretinal hyper-reflective material, intraretinal fluid (Mild interval improvement in IRF / IRHM temporal fovea and mac).   Notes *Images captured and stored on drive  Diagnosis / Impression:  +DME OU OD: Persistent IRF/cystic  changes temporal macula, scattered punctate IRHM   OS: Interval improvement in IRF / IRHM temporal fovea and mac  Clinical management:  See below  Abbreviations: NFP - Normal foveal profile. CME - cystoid macular edema. PED - pigment epithelial detachment. IRF - intraretinal fluid. SRF - subretinal fluid. EZ - ellipsoid zone. ERM - epiretinal membrane. ORA - outer retinal atrophy. ORT - outer retinal tubulation. SRHM - subretinal hyper-reflective material      Intravitreal Injection, Pharmacologic Agent - OD - Right Eye       Time Out 08/19/2023. 2:58 PM. Confirmed correct patient, procedure, site, and patient consented.   Anesthesia Topical anesthesia was used. Anesthetic medications included Lidocaine  2%, Proparacaine 0.5%.   Procedure Preparation included 5% betadine to ocular surface, eyelid speculum. A (32g) needle was used.   Injection: 2 mg aflibercept  2 MG/0.05ML   Route: Intravitreal, Site: Right Eye   NDC: D2246706, Lot: [id number], Expiration  date: 12/21/2024, Waste: 0 mL   Post-op Post injection exam found visual acuity of at least counting fingers. The patient tolerated the procedure well. There were no complications. The patient received written and verbal post procedure care education. Post injection medications were not given.      Intravitreal Injection, Pharmacologic Agent - OS - Left Eye       Time Out 07/21/2024. 2:58 PM. Confirmed correct patient, procedure, site, and patient consented.   Anesthesia Topical anesthesia was used. Anesthetic medications included Lidocaine  2%, Proparacaine 0.5%.   Procedure Preparation included 5% betadine to ocular surface, eyelid speculum. A supplied (32g) needle was used.   Injection: 6 mg faricimab -svoa 6 MG/0.05ML   Route: Intravitreal, Site: Left Eye   NDC: 49757-903-93, Lot: [id number], Expiration date: 07/21/2024, Waste: 0 mL   Post-op Post injection exam found visual acuity of at least counting fingers. The  patient tolerated the procedure well. There were no complications. The patient received written and verbal post procedure care education. Post injection medications were not given.            ASSESSMENT/PLAN:    ICD-10-CM   1. Moderate nonproliferative diabetic retinopathy of both eyes with macular edema associated with type 2 diabetes mellitus (HCC)  E11.3313 OCT, Retina - OU - Both Eyes    Intravitreal Injection, Pharmacologic Agent - OD - Right Eye    Intravitreal Injection, Pharmacologic Agent - OS - Left Eye    faricimab -svoa (VABYSMO ) 6mg /0.77mL intravitreal injection    aflibercept  (EYLEA ) SOLN 2 mg    2. Current use of insulin  (HCC)  Z79.4     3. Long term (current) use of oral hypoglycemic drugs  Z79.84     4. Essential hypertension  I10     5. Hypertensive retinopathy of both eyes  H35.033     6. Nuclear sclerosis of both eyes  H25.13     7. Dry eyes  H04.123      1-3.  Moderate non-proliferative diabetic retinopathy w/ DME, OU  - delayed f/u -- 11.5 wks instead of 4 (10.25.24 to 01.15.25)  - A1c 9.1 on 03.20.25  - pt lost to f/u from 10.23.2019 to 11.18.20  - FA in 2019 showed leaking MA OU; no NV OU **of note, pt had pruritic rxn to fluorescein  dye -- resolved with 12.5 mg IV benadryl **  - FA 12.16.2020 shows late leaking MA's, non-perfusion defects; no NV OU - s/p IVA OD #1 (12.16.20), #2 (01.25.21), #3 (05.26.21), #4 (07.02.21), #5 (10.15.21), #6 (11.23.21), #7 (07.27.22), #8 (09.09.22), #9 (11.11.22), #10 (01.20.23), #11 (02.23.23), #12 (04.09.23), #13 (05.23.23), #14 (06.30.23), #15 (08.04.23), #16 (09.01.23), #17 (10.06.23), #18 (11.10.23) #19 (12.15.23), #20 (01.19.24), #21 (02.22.24), #22 (04.09.24), #23 (06.28.24), #24 (07.29.24), #25 (09.27.24), #26 (10.25.24), #27 (04.02.25) -- IVA resistance - s/p IVA OS #1 (12.23.20), #2 (01.27.21), #3 (03.02.21), #4 (05.26.21), #5 (07.02.21), #6 (10.15.21), #7 (11.23.21), #8 (01.11.22), #9 (03.04.22), #10 (04.27.22), #11  (11.14.22), #12 (01.10.23), #13 (02.20.23), #14 (04.05.23), #15 (05.19.23), #16 (06.23.23), #17 (07.28.23), #18 (09.01.23), #19 (10.06.23), #20 (11.10.23) #21 (12.15.23), #22 (01.19.24), #23 (02.22.24), #24 (04.01.24), #25 (04.30.24), #26 (05.28.24), #27 (06.28.24) -- IVA Resistance - s/p IVE OD #1 (05.02.25) #2(05.30.25) ======================================  - s/p focal laser OD (03.19.21), (04.01.22) ====================================== - s/p IVE OS #1 (07.29.24), #2 (08.30.24), #3 (09.24.24), #4 (10.25.24), #5 (01.15.25) -- IVE resistance ====================================== - s/p IVV OS #1 (02.26.25), #2 (03.26.25), #3 (05.02.25), #4 (05.30.25) ======================================  - exam shows scattered MA, IRH, exudates and mild macular edema OU  -  BCVA OD 20/20 - stable; OS 20/25 from 20/20 - OCT shows NI:Ezmdpduzwu IRF/cystic changes temporal macula, scattered punctate IRHM; OS: Interval improvement in IRF / IRHM temporal fovea and mac at 4 wks - Recommend IVE OD #3 and IVV OS #5 today, 06.27.25 w/ f/u in 4 wks  - pt wishes to proceed with injections - RBA of procedure discussed, questions answered - see procedure note  - Vabsymo informed consent form was signed and scanned on 02.26.25 (OU)  - Eylea  informed consent form was signed and scanned on 05.02.25 (OD)  - f/u 4 weeks, DFE, OCT, possible injection(s)  4,5. Hypertensive retinopathy OU  - discussed importance of tight BP control             - continue to monitor   6. Nuclear Sclerosis  - The symptoms of cataract, surgical options, and treatments and risks were discussed with patient.   - discussed diagnosis and progression  - under the expert management of [facility]   7. Dry eyes OU - recommend artificial tears and lubricating ointment as needed  Return in about 4 weeks (around 09/16/2023) for f/u NPDR OU, DFE, OCT, Possible Injxn.   Ophthalmic Meds Ordered this visit:  Meds ordered this encounter   Medications   faricimab -svoa (VABYSMO ) 6mg /0.11mL intravitreal injection   aflibercept  (EYLEA ) SOLN 2 mg    This document serves as a record of services personally performed by Redell JUDITHANN Hans, MD, PhD. It was created on their behalf by Delon Newness COT, an ophthalmic technician. The creation of this record is the provider's dictation and/or activities during the visit.    Electronically signed by: Delon Newness COT 06.26.25 9:41 PM  This document serves as a record of services personally performed by Redell JUDITHANN Hans, MD, PhD. It was created on their behalf by Alan PARAS. Delores, OA an ophthalmic technician. The creation of this record is the provider's dictation and/or activities during the visit.    Electronically signed by: Alan PARAS. Delores, OA 08/21/23 9:41 PM  Redell JUDITHANN Hans, M.D., Ph.D. Diseases & Surgery of the Retina and Vitreous [facility] 08/19/2023   I have reviewed the above documentation for accuracy and completeness, and I agree with the above. Redell JUDITHANN Hans, M.D., Ph.D. 08/21/23 9:42 PM   Abbreviations: M myopia (nearsighted); A astigmatism; H hyperopia (farsighted); P presbyopia; Mrx spectacle prescription;  CTL contact lenses; OD right eye; OS left eye; OU both eyes  XT exotropia; ET esotropia; PEK punctate epithelial keratitis; PEE punctate epithelial erosions; DES dry eye syndrome; MGD meibomian gland dysfunction; ATs artificial tears; PFAT's preservative free artificial tears; NSC nuclear sclerotic cataract; PSC posterior subcapsular cataract; ERM epi-retinal membrane; PVD posterior vitreous detachment; RD retinal detachment; DM diabetes mellitus; DR diabetic retinopathy; NPDR non-proliferative diabetic retinopathy; PDR proliferative diabetic retinopathy; CSME clinically significant macular edema; DME diabetic macular edema; dbh dot blot hemorrhages; CWS cotton wool spot; POAG primary open angle glaucoma; C/D cup-to-disc ratio; HVF  humphrey visual field; GVF goldmann visual field; OCT optical coherence tomography; IOP intraocular pressure; BRVO Branch retinal vein occlusion; CRVO central retinal vein occlusion; CRAO central retinal artery occlusion; BRAO branch retinal artery occlusion; RT retinal tear; SB scleral buckle; PPV pars plana vitrectomy; VH Vitreous hemorrhage; PRP panretinal laser photocoagulation; IVK intravitreal kenalog ; VMT vitreomacular traction; MH Macular hole;  NVD neovascularization of the disc; NVE neovascularization elsewhere; AREDS age related eye disease study; ARMD age related macular degeneration; POAG primary open angle glaucoma; EBMD epithelial/anterior basement membrane  dystrophy; ACIOL anterior chamber intraocular lens; IOL intraocular lens; PCIOL posterior chamber intraocular lens; Phaco/IOL phacoemulsification with intraocular lens placement; PRK photorefractive keratectomy; LASIK laser assisted in situ keratomileusis; HTN hypertension; DM diabetes mellitus; COPD chronic obstructive pulmonary disease

## 2023-08-19 ENCOUNTER — Encounter (INDEPENDENT_AMBULATORY_CARE_PROVIDER_SITE_OTHER): Payer: Self-pay | Admitting: Ophthalmology

## 2023-08-19 ENCOUNTER — Ambulatory Visit (INDEPENDENT_AMBULATORY_CARE_PROVIDER_SITE_OTHER): Admitting: Ophthalmology

## 2023-08-19 DIAGNOSIS — E113313 Type 2 diabetes mellitus with moderate nonproliferative diabetic retinopathy with macular edema, bilateral: Secondary | ICD-10-CM | POA: Diagnosis not present

## 2023-08-19 DIAGNOSIS — H04123 Dry eye syndrome of bilateral lacrimal glands: Secondary | ICD-10-CM

## 2023-08-19 DIAGNOSIS — Z794 Long term (current) use of insulin: Secondary | ICD-10-CM

## 2023-08-19 DIAGNOSIS — I10 Essential (primary) hypertension: Secondary | ICD-10-CM

## 2023-08-19 DIAGNOSIS — H35033 Hypertensive retinopathy, bilateral: Secondary | ICD-10-CM

## 2023-08-19 DIAGNOSIS — H2513 Age-related nuclear cataract, bilateral: Secondary | ICD-10-CM

## 2023-08-19 DIAGNOSIS — Z7984 Long term (current) use of oral hypoglycemic drugs: Secondary | ICD-10-CM

## 2023-08-19 MED ORDER — AFLIBERCEPT 2MG/0.05ML IZ SOLN FOR KALEIDOSCOPE
2.0000 mg | INTRAVITREAL | Status: AC | PRN
  Administered 2023-08-19: 2 mg via INTRAVITREAL

## 2023-08-19 MED ORDER — FARICIMAB-SVOA 6 MG/0.05ML IZ SOSY
6.0000 mg | PREFILLED_SYRINGE | INTRAVITREAL | Status: AC | PRN
  Administered 2023-08-19: 6 mg via INTRAVITREAL

## 2023-09-14 NOTE — Progress Notes (Shared)
 [facility] - Clinic Note  09/16/2023     CHIEF COMPLAINT Patient presents for No chief complaint on file.  HISTORY OF PRESENT ILLNESS: Ana Long is a 48 y.o. female who presents to the clinic today for:      Patient states has a new inferior floater in her left eye, the right eye is doing okay, both eyes are watering  Referring physician: Octavia Charlie Hamilton MD [address] [place], [place] [address]  HISTORICAL INFORMATION:   Selected notes from the MEDICAL RECORD NUMBER Re-referred by Dr. Hamilton Octavia for DM exam   CURRENT MEDICATIONS: Current Outpatient Medications (Ophthalmic Drugs)  Medication Sig   Olopatadine HCl 0.2 % SOLN Place one drop into both eyes daily.   No current facility-administered medications for this visit. (Ophthalmic Drugs)   Current Outpatient Medications (Other)  Medication Sig   acyclovir  (ZOVIRAX ) 400 MG tablet TAKE 1 TABLET BY MOUTH TWICE A DAY   albuterol  (PROAIR  HFA) 108 (90 Base) MCG/ACT inhaler Inhale 2 puffs into the lungs every 4 (four) hours as needed for wheezing or shortness of breath (cough). (Patient taking differently: Inhale 2 puffs into the lungs every 4 (four) hours as needed for wheezing or shortness of breath (or coughing).)   atorvastatin  (LIPITOR) 10 MG tablet Take 10 mg by mouth daily.   benzonatate  (TESSALON ) 200 MG capsule Take 1 capsule (200 mg total) by mouth 3 (three) times daily as needed for cough.   Blood Glucose Monitoring Suppl (ACCU-CHEK AVIVA PLUS) w/Device KIT Please check you blood sugars 4 times a day (Patient not taking: Reported on 08/19/2023)   CIPRODEX OTIC suspension Place into the right ear.   cyclobenzaprine  (FLEXERIL ) 10 MG tablet Take 1 tablet (10 mg total) by mouth 3 (three) times daily as needed for muscle spasms.   DERMA-SMOOTHE/FS SCALP 0.01 % OIL Apply topically.   diclofenac  (VOLTAREN ) 75 MG EC tablet Take 1 tablet (75 mg total) by mouth 2 (two) times daily.   fluconazole   (DIFLUCAN ) 150 MG tablet TAKE 1 TABLET BY MOUTH NOW. THEN REPEAT IN 1 WEEK   furosemide  (LASIX ) 20 MG tablet Take 20 mg by mouth daily as needed for fluid or edema.   glucose blood (ACCU-CHEK SMARTVIEW) test strip TEST FOUR TIMES DAILY (Patient not taking: Reported on 08/19/2023)   HUMULIN  R U-500 KWIKPEN 500 UNIT/ML kwikpen Inject into the skin.   hydrochlorothiazide (HYDRODIURIL) 12.5 MG tablet Take by mouth.   Insulin  Pen Needle (B-D UF III MINI PEN NEEDLES) 31G X 5 MM MISC USE AS DIRECTED TO INJECT INSULIN  FIVE A DAY E11.65   lactulose  (CEPHULAC ) 10 g packet Take 1 packet (10 g total) by mouth daily as needed. Reported on 07/29/2015 (Patient taking differently: Take 10 g by mouth daily as needed (for constipation).)   Lancets (ACCU-CHEK SOFT TOUCH) lancets Use as instructed (Patient not taking: Reported on 08/19/2023)   loratadine  (CLARITIN ) 10 MG tablet Take 1 tablet (10 mg total) by mouth 2 (two) times daily.   mometasone -formoterol  (DULERA ) 200-5 MCG/ACT AERO Inhale 2 puffs into the lungs 2 (two) times daily.   montelukast  (SINGULAIR ) 10 MG tablet Take 1 tablet (10 mg total) by mouth at bedtime.   nitroGLYCERIN  (NITRODUR - DOSED IN MG/24 HR) 0.2 mg/hr patch Apply 1/4th patch to affected achilles, change daily   omeprazole  (PRILOSEC) 20 MG capsule Take 1 capsule (20 mg total) by mouth daily before breakfast.   SPIRIVA RESPIMAT 1.25 MCG/ACT AERS SMARTSIG:2 Puff(s) Via Inhaler  Daily   sulfamethoxazole -trimethoprim  (BACTRIM  DS) 800-160 MG tablet Take 1 tablet by mouth 2 (two) times daily.   topiramate  (TOPAMAX ) 25 MG tablet Take by mouth.   Current Facility-Administered Medications (Other)  Medication Route   diphenhydrAMINE  (BENADRYL ) injection 12.5 mg Intramuscular   REVIEW OF SYSTEMS:     ALLERGIES Allergies  Allergen Reactions   Bee Venom Anaphylaxis and Swelling   Wasp Venom Anaphylaxis and Swelling   Citrus Hives and Itching   Latex Hives and Swelling   Penicillins Hives and  Itching    Has patient had a PCN reaction causing immediate rash, facial/tongue/throat swelling, SOB or lightheadedness with hypotension: Yes Has patient had a PCN reaction causing severe rash involving mucus membranes or skin necrosis: Unk Has patient had a PCN reaction that required hospitalization: Was already in hosp Has patient had a PCN reaction occurring within the last 10 years: No If all of the above answers are NO, then may proceed with Cephalosporin use.    Shellfish Allergy Other (See Comments) and Swelling    Patient is uncertain; stated that she tolerates this (??)   Soap Hives, Itching and Swelling    PUREX LAUNDRY DETERGENT   Tomato Hives and Itching   Banana Itching and Nausea Only   Chocolate Itching   Fluticasone  Other (See Comments)    Per patient, makes sneeze and gag   Hydrocodone  Itching and Nausea Only   Other Itching    Acidic foods- Itching [place]- Itching in the roof of the mouth   Adhesive [Tape] Itching and Rash    Allergic to  leads   Doxycycline Other (See Comments)    AFFECTS JOINTS   Liraglutide Nausea Only   Metformin  And Related Nausea Only, Rash, Other (See Comments) and Diarrhea    GI UPSET, also   Red Dye #40 (Allura Red) Itching, Rash and Other (See Comments)    At eye doctor's office, the dilating drops- Skin breaks out, chest turned red, itching, face became swollen   PAST MEDICAL HISTORY 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   Cataract    NS OU   Complication of anesthesia    states is hard to wake up post-op   Constipation 12/30/2014   Diabetes mellitus    intolerant to Metformin    Diabetic retinopathy (HCC)    NPDR OU   Dry skin    hands    Family history of anesthesia  complication    mother went into coma during c-section   Genital herpes 01/01/2006   on acyclovir  BID    GERD (gastroesophageal reflux disease)    Herpes genital    Hyperglycemia 12/26/2017   Hypertensive retinopathy    OU   Low back pain 11/22/2012   Lower extremity edema 10/18/2012   Menorrhagia 11/06/2015   Morbid obesity with BMI of 50 to 60 07/23/2009   Obesity hypoventilation syndrome (HCC) 10/30/2012   Obesity, morbid (HCC) 01/10/2014   OSA on CPAP     AHI was on  11-17-12 to 120/hr, titrated to 15 cm with 3 cm EPR/    PCOS (polycystic ovarian syndrome)    Rheumatoid arthritis (HCC)    Right ankle pain 06/19/2015   Right hip pain 09/04/2017  Right shoulder pain 06/01/2017   Seasonal allergies    Sleep apnea, obstructive    uses CPAP nightly - had sleep study 08/22/2007   Past Surgical History:  Procedure Laterality Date   CARPAL TUNNEL RELEASE Right 10/02/2012   Procedure: RIGHT CARPAL TUNNEL RELEASE ENDOSCOPIC;  Surgeon: Alm DELENA Hummer, MD;  Location: [facility];  Service: Orthopedics;  Laterality: Right;   CHOLECYSTECTOMY N/A 12/02/2014   Procedure: LAPAROSCOPIC CHOLECYSTECTOMY;  Surgeon: Krystal Spinner, MD;  Location: [facility];  Service: General;  Laterality: N/A;   HYSTEROSCOPY WITH D & C  01-08-2002   FAMILY HISTORY Family History  Problem Relation Age of Onset   Other Mother    Diabetes Maternal Aunt    Heart attack Maternal Aunt    Alzheimer's disease Maternal Uncle    Cancer Maternal Uncle        prostate   Diabetes Paternal Aunt    Cancer Paternal Aunt        brain,mouth   Alzheimer's disease Paternal Aunt    Colon polyps Paternal Uncle    Diabetes Paternal Uncle    Diabetes Maternal Grandmother    Diabetes Maternal Grandfather    Leukemia Cousin    Other Other    Obesity Other    Other Other    Colitis Neg Hx    SOCIAL HISTORY Social History   Tobacco Use   Smoking status: Former    Current packs/day: 0.00    Types: Cigarettes    Start  date: 05/23/2000    Quit date: 05/24/2010    Years since quitting: 13.3   Smokeless tobacco: Never  Vaping Use   Vaping status: Never Used  Substance Use Topics   Alcohol use: Yes    Alcohol/week: 0.0 standard drinks of alcohol    Comment: socially   Drug use: No       OPHTHALMIC EXAM: Not recorded    IMAGING AND PROCEDURES  Imaging and Procedures for @TODAY @          ASSESSMENT/PLAN:  No diagnosis found.  1-3.  Moderate non-proliferative diabetic retinopathy w/ DME, OU  - delayed f/u -- 11.5 wks instead of 4 (10.25.24 to 01.15.25)  - A1c 9.1 on 03.20.25  - pt lost to f/u from 10.23.2019 to 11.18.20  - FA in 2019 showed leaking MA OU; no NV OU **of note, pt had pruritic rxn to fluorescein  dye -- resolved with 12.5 mg IV benadryl **  - FA 12.16.2020 shows late leaking MA's, non-perfusion defects; no NV OU - s/p IVA OD #1 (12.16.20), #2 (01.25.21), #3 (05.26.21), #4 (07.02.21), #5 (10.15.21), #6 (11.23.21), #7 (07.27.22), #8 (09.09.22), #9 (11.11.22), #10 (01.20.23), #11 (02.23.23), #12 (04.09.23), #13 (05.23.23), #14 (06.30.23), #15 (08.04.23), #16 (09.01.23), #17 (10.06.23), #18 (11.10.23) #19 (12.15.23), #20 (01.19.24), #21 (02.22.24), #22 (04.09.24), #23 (06.28.24), #24 (07.29.24), #25 (09.27.24), #26 (10.25.24), #27 (04.02.25) -- IVA resistance - s/p IVA OS #1 (12.23.20), #2 (01.27.21), #3 (03.02.21), #4 (05.26.21), #5 (07.02.21), #6 (10.15.21), #7 (11.23.21), #8 (01.11.22), #9 (03.04.22), #10 (04.27.22), #11 (11.14.22), #12 (01.10.23), #13 (02.20.23), #14 (04.05.23), #15 (05.19.23), #16 (06.23.23), #17 (07.28.23), #18 (09.01.23), #19 (10.06.23), #20 (11.10.23) #21 (12.15.23), #22 (01.19.24), #23 (02.22.24), #24 (04.01.24), #25 (04.30.24), #26 (05.28.24), #27 (06.28.24) -- IVA Resistance - s/p IVE OD #1 (05.02.25) #2(05.30.25) #3(06.27.25) ======================================  - s/p focal laser OD (03.19.21), (04.01.22) ====================================== - s/p IVE OS #1  (07.29.24), #2 (08.30.24), #3 (09.24.24), #4 (10.25.24), #5 (01.15.25) -- IVE resistance ====================================== - s/p IVV OS #1 (02.26.25), #2 (03.26.25), #3 (  05.02.25), #4 (05.30.25) #5(06.27.2025) ======================================  - exam shows scattered MA, IRH, exudates and mild macular edema OU  - BCVA OD 20/20 - stable; OS 20/25 from 20/20 - OCT shows NI:Ezmdpduzwu IRF/cystic changes temporal macula, scattered punctate IRHM; OS: Interval improvement in IRF / IRHM temporal fovea and mac at 4 wks - Recommend IVE OD #4 and IVV OS #6 today, 07.25.25 w/ f/u in 4 wks  - pt wishes to proceed with injections - RBA of procedure discussed, questions answered - see procedure note  - [facility] informed consent form was signed and scanned on 02.26.25 (OU)  - Eylea  informed consent form was signed and scanned on 05.02.25 (OD)  - f/u 4 weeks, DFE, OCT, possible injection(s)  4,5. Hypertensive retinopathy OU  - discussed importance of tight BP control             - continue to monitor   6. Nuclear Sclerosis  - The symptoms of cataract, surgical options, and treatments and risks were discussed with patient.   - discussed diagnosis and progression  - under the expert management of [facility]   7. Dry eyes OU - recommend artificial tears and lubricating ointment as needed  No follow-ups on file.   Ophthalmic Meds Ordered this visit:  No orders of the defined types were placed in this encounter.   This document serves as a record of services personally performed by Redell JUDITHANN Hans, MD, PhD. It was created on their behalf by Delon Newness COT, an ophthalmic technician. The creation of this record is the provider's dictation and/or activities during the visit.    Electronically signed by: Delon Newness COT 07.23.25 10:36 AM    Abbreviations: M myopia (nearsighted); A astigmatism; H hyperopia (farsighted); P presbyopia; Mrx spectacle prescription;  CTL contact  lenses; OD right eye; OS left eye; OU both eyes  XT exotropia; ET esotropia; PEK punctate epithelial keratitis; PEE punctate epithelial erosions; DES dry eye syndrome; MGD meibomian gland dysfunction; ATs artificial tears; PFAT's preservative free artificial tears; NSC nuclear sclerotic cataract; PSC posterior subcapsular cataract; ERM epi-retinal membrane; PVD posterior vitreous detachment; RD retinal detachment; DM diabetes mellitus; DR diabetic retinopathy; NPDR non-proliferative diabetic retinopathy; PDR proliferative diabetic retinopathy; CSME clinically significant macular edema; DME diabetic macular edema; dbh dot blot hemorrhages; CWS cotton wool spot; POAG primary open angle glaucoma; C/D cup-to-disc ratio; HVF humphrey visual field; GVF goldmann visual field; OCT optical coherence tomography; IOP intraocular pressure; BRVO Branch retinal vein occlusion; CRVO central retinal vein occlusion; CRAO central retinal artery occlusion; BRAO branch retinal artery occlusion; RT retinal tear; SB scleral buckle; PPV pars plana vitrectomy; VH Vitreous hemorrhage; PRP panretinal laser photocoagulation; IVK intravitreal kenalog ; VMT vitreomacular traction; MH Macular hole;  NVD neovascularization of the disc; NVE neovascularization elsewhere; AREDS age related eye disease study; ARMD age related macular degeneration; POAG primary open angle glaucoma; EBMD epithelial/anterior basement membrane dystrophy; ACIOL anterior chamber intraocular lens; IOL intraocular lens; PCIOL posterior chamber intraocular lens; Phaco/IOL phacoemulsification with intraocular lens placement; PRK photorefractive keratectomy; LASIK laser assisted in situ keratomileusis; HTN hypertension; DM diabetes mellitus; COPD chronic obstructive pulmonary disease

## 2023-09-16 ENCOUNTER — Encounter (INDEPENDENT_AMBULATORY_CARE_PROVIDER_SITE_OTHER): Admitting: Ophthalmology

## 2023-09-16 DIAGNOSIS — H2513 Age-related nuclear cataract, bilateral: Secondary | ICD-10-CM

## 2023-09-16 DIAGNOSIS — H35033 Hypertensive retinopathy, bilateral: Secondary | ICD-10-CM

## 2023-09-16 DIAGNOSIS — I10 Essential (primary) hypertension: Secondary | ICD-10-CM

## 2023-09-16 DIAGNOSIS — H04123 Dry eye syndrome of bilateral lacrimal glands: Secondary | ICD-10-CM

## 2023-09-16 DIAGNOSIS — Z7984 Long term (current) use of oral hypoglycemic drugs: Secondary | ICD-10-CM

## 2023-09-16 DIAGNOSIS — Z794 Long term (current) use of insulin: Secondary | ICD-10-CM

## 2023-09-16 DIAGNOSIS — E113313 Type 2 diabetes mellitus with moderate nonproliferative diabetic retinopathy with macular edema, bilateral: Secondary | ICD-10-CM

## 2023-09-20 ENCOUNTER — Encounter (INDEPENDENT_AMBULATORY_CARE_PROVIDER_SITE_OTHER): Admitting: Ophthalmology

## 2023-09-21 ENCOUNTER — Encounter (INDEPENDENT_AMBULATORY_CARE_PROVIDER_SITE_OTHER): Payer: Self-pay | Admitting: Ophthalmology

## 2023-09-21 ENCOUNTER — Ambulatory Visit (INDEPENDENT_AMBULATORY_CARE_PROVIDER_SITE_OTHER): Admitting: Ophthalmology

## 2023-09-21 DIAGNOSIS — I10 Essential (primary) hypertension: Secondary | ICD-10-CM | POA: Diagnosis not present

## 2023-09-21 DIAGNOSIS — H2513 Age-related nuclear cataract, bilateral: Secondary | ICD-10-CM

## 2023-09-21 DIAGNOSIS — Z7984 Long term (current) use of oral hypoglycemic drugs: Secondary | ICD-10-CM

## 2023-09-21 DIAGNOSIS — H35033 Hypertensive retinopathy, bilateral: Secondary | ICD-10-CM

## 2023-09-21 DIAGNOSIS — E113313 Type 2 diabetes mellitus with moderate nonproliferative diabetic retinopathy with macular edema, bilateral: Secondary | ICD-10-CM | POA: Diagnosis not present

## 2023-09-21 DIAGNOSIS — H04123 Dry eye syndrome of bilateral lacrimal glands: Secondary | ICD-10-CM

## 2023-09-21 DIAGNOSIS — Z794 Long term (current) use of insulin: Secondary | ICD-10-CM | POA: Diagnosis not present

## 2023-09-21 MED ORDER — AFLIBERCEPT 2MG/0.05ML IZ SOLN FOR KALEIDOSCOPE
2.0000 mg | INTRAVITREAL | Status: AC | PRN
  Administered 2023-09-21: 2 mg via INTRAVITREAL

## 2023-09-21 NOTE — Progress Notes (Signed)
 [facility] - Clinic Note  09/21/2023     CHIEF COMPLAINT Patient presents for Retina Follow Up  HISTORY OF PRESENT ILLNESS: Ana Long is a 48 y.o. female who presents to the clinic today for:   HPI     Retina Follow Up   Patient presents with  Diabetic Retinopathy.  In both eyes.  This started 4 weeks ago.  I, the attending physician,  performed the HPI with the patient and updated documentation appropriately.        Comments   Patient here for 4 weeks retina follow up for NPDR OU. Patient states vision is ok. Has more new floaties. No eye pain just drainage.       Last edited by Valdemar Rogue, MD on 09/21/2023  5:06 PM.    Pt states her blood sugar is low in the office today, pt states she is still seeing floaters, she is going to get stronger glasses   Referring physician: Octavia Charlie Hamilton MD [address] [place], [place] [address]  HISTORICAL INFORMATION:   Selected notes from the MEDICAL RECORD NUMBER Re-referred by Dr. Hamilton Octavia for DM exam   CURRENT MEDICATIONS: Current Outpatient Medications (Ophthalmic Drugs)  Medication Sig   Olopatadine HCl 0.2 % SOLN Place one drop into both eyes daily.   No current facility-administered medications for this visit. (Ophthalmic Drugs)   Current Outpatient Medications (Other)  Medication Sig   acyclovir  (ZOVIRAX ) 400 MG tablet TAKE 1 TABLET BY MOUTH TWICE A DAY   albuterol  (PROAIR  HFA) 108 (90 Base) MCG/ACT inhaler Inhale 2 puffs into the lungs every 4 (four) hours as needed for wheezing or shortness of breath (cough). (Patient taking differently: Inhale 2 puffs into the lungs every 4 (four) hours as needed for wheezing or shortness of breath (or coughing).)   atorvastatin  (LIPITOR) 10 MG tablet Take 10 mg by mouth daily.   benzonatate  (TESSALON ) 200 MG capsule Take 1 capsule (200 mg total) by mouth 3 (three) times daily as needed for cough.   CIPRODEX OTIC suspension Place into the right ear.    cyclobenzaprine  (FLEXERIL ) 10 MG tablet Take 1 tablet (10 mg total) by mouth 3 (three) times daily as needed for muscle spasms.   DERMA-SMOOTHE/FS SCALP 0.01 % OIL Apply topically.   diclofenac  (VOLTAREN ) 75 MG EC tablet Take 1 tablet (75 mg total) by mouth 2 (two) times daily.   fluconazole  (DIFLUCAN ) 150 MG tablet TAKE 1 TABLET BY MOUTH NOW. THEN REPEAT IN 1 WEEK   furosemide  (LASIX ) 20 MG tablet Take 20 mg by mouth daily as needed for fluid or edema.   HUMULIN  R U-500 KWIKPEN 500 UNIT/ML kwikpen Inject into the skin.   hydrochlorothiazide (HYDRODIURIL) 12.5 MG tablet Take by mouth.   Insulin  Pen Needle (B-D UF III MINI PEN NEEDLES) 31G X 5 MM MISC USE AS DIRECTED TO INJECT INSULIN  FIVE A DAY E11.65   lactulose  (CEPHULAC ) 10 g packet Take 1 packet (10 g total) by mouth daily as needed. Reported on 07/29/2015 (Patient taking differently: Take 10 g by mouth daily as needed (for constipation).)   loratadine  (CLARITIN ) 10 MG tablet Take 1 tablet (10 mg total) by mouth 2 (two) times daily.   mometasone -formoterol  (DULERA ) 200-5 MCG/ACT AERO Inhale 2 puffs into the lungs 2 (two) times daily.   montelukast  (SINGULAIR ) 10 MG tablet Take 1 tablet (10 mg total) by mouth at bedtime.   nitroGLYCERIN  (NITRODUR - DOSED IN MG/24 HR) 0.2 mg/hr patch  Apply 1/4th patch to affected achilles, change daily   omeprazole  (PRILOSEC) 20 MG capsule Take 1 capsule (20 mg total) by mouth daily before breakfast.   SPIRIVA RESPIMAT 1.25 MCG/ACT AERS SMARTSIG:2 Puff(s) Via Inhaler Daily   sulfamethoxazole -trimethoprim  (BACTRIM  DS) 800-160 MG tablet Take 1 tablet by mouth 2 (two) times daily.   topiramate  (TOPAMAX ) 25 MG tablet Take by mouth.   Blood Glucose Monitoring Suppl (ACCU-CHEK AVIVA PLUS) w/Device KIT Please check you blood sugars 4 times a day (Patient not taking: Reported on 09/21/2023)   glucose blood (ACCU-CHEK SMARTVIEW) test strip TEST FOUR TIMES DAILY (Patient not taking: Reported on 09/21/2023)   Lancets  (ACCU-CHEK SOFT TOUCH) lancets Use as instructed (Patient not taking: Reported on 09/21/2023)   Current Facility-Administered Medications (Other)  Medication Route   diphenhydrAMINE  (BENADRYL ) injection 12.5 mg Intramuscular   REVIEW OF SYSTEMS: ROS   Positive for: Endocrine, Eyes, Respiratory Negative for: Constitutional, Gastrointestinal, Neurological, Skin, Genitourinary, Musculoskeletal, HENT, Cardiovascular, Psychiatric, Allergic/Imm, Heme/Lymph Last edited by Orval Asberry RAMAN, COA on 09/21/2023  2:33 PM.     ALLERGIES Allergies  Allergen Reactions   Bee Venom Anaphylaxis and Swelling   Wasp Venom Anaphylaxis and Swelling   Citrus Hives and Itching   Latex Hives and Swelling   Penicillins Hives and Itching    Has patient had a PCN reaction causing immediate rash, facial/tongue/throat swelling, SOB or lightheadedness with hypotension: Yes Has patient had a PCN reaction causing severe rash involving mucus membranes or skin necrosis: Unk Has patient had a PCN reaction that required hospitalization: Was already in hosp Has patient had a PCN reaction occurring within the last 10 years: No If all of the above answers are NO, then may proceed with Cephalosporin use.    Shellfish Allergy Other (See Comments) and Swelling    Patient is uncertain; stated that she tolerates this (??)   Soap Hives, Itching and Swelling    PUREX LAUNDRY DETERGENT   Tomato Hives and Itching   Banana Itching and Nausea Only   Chocolate Itching   Fluticasone  Other (See Comments)    Per patient, makes sneeze and gag   Hydrocodone  Itching and Nausea Only   Other Itching    Acidic foods- Itching [place]- Itching in the roof of the mouth   Adhesive [Tape] Itching and Rash    Allergic to  leads   Doxycycline Other (See Comments)    AFFECTS JOINTS   Liraglutide Nausea Only   Metformin  And Related Nausea Only, Rash, Other (See Comments) and Diarrhea    GI UPSET, also   Red Dye #40 (Allura Red)  Itching, Rash and Other (See Comments)    At eye doctor's office, the dilating drops- Skin breaks out, chest turned red, itching, face became swollen   PAST MEDICAL HISTORY 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   Cataract    NS OU   Complication of anesthesia    states is hard to wake up post-op   Constipation 12/30/2014   Diabetes mellitus    intolerant to Metformin    Diabetic retinopathy (HCC)    NPDR OU   Dry skin    hands    Family history of anesthesia complication    mother went into  coma during c-section   Genital herpes 01/01/2006   on acyclovir  BID    GERD (gastroesophageal reflux disease)    Herpes genital    Hyperglycemia 12/26/2017   Hypertensive retinopathy    OU   Low back pain 11/22/2012   Lower extremity edema 10/18/2012   Menorrhagia 11/06/2015   Morbid obesity with BMI of 50 to 60 07/23/2009   Obesity hypoventilation syndrome (HCC) 10/30/2012   Obesity, morbid (HCC) 01/10/2014   OSA on CPAP     AHI was on  11-17-12 to 120/hr, titrated to 15 cm with 3 cm EPR/    PCOS (polycystic ovarian syndrome)    Rheumatoid arthritis (HCC)    Right ankle pain 06/19/2015   Right hip pain 09/04/2017   Right shoulder pain 06/01/2017   Seasonal allergies    Sleep apnea, obstructive    uses CPAP nightly - had sleep study 08/22/2007   Past Surgical History:  Procedure Laterality Date   CARPAL TUNNEL RELEASE Right 10/02/2012   Procedure: RIGHT CARPAL TUNNEL RELEASE ENDOSCOPIC;  Surgeon: Alm DELENA Hummer, MD;  Location: [facility];  Service: Orthopedics;  Laterality: Right;   CHOLECYSTECTOMY N/A 12/02/2014   Procedure: LAPAROSCOPIC CHOLECYSTECTOMY;  Surgeon: Krystal Spinner, MD;  Location: [facility];  Service: General;  Laterality:  N/A;   HYSTEROSCOPY WITH D & C  01-08-2002   FAMILY HISTORY Family History  Problem Relation Age of Onset   Other Mother    Diabetes Maternal Aunt    Heart attack Maternal Aunt    Alzheimer's disease Maternal Uncle    Cancer Maternal Uncle        prostate   Diabetes Paternal Aunt    Cancer Paternal Aunt        brain,mouth   Alzheimer's disease Paternal Aunt    Colon polyps Paternal Uncle    Diabetes Paternal Uncle    Diabetes Maternal Grandmother    Diabetes Maternal Grandfather    Leukemia Cousin    Other Other    Obesity Other    Other Other    Colitis Neg Hx    SOCIAL HISTORY Social History   Tobacco Use   Smoking status: Former    Current packs/day: 0.00    Types: Cigarettes    Start date: 05/23/2000    Quit date: 05/24/2010    Years since quitting: 13.3   Smokeless tobacco: Never  Vaping Use   Vaping status: Never Used  Substance Use Topics   Alcohol use: Yes    Alcohol/week: 0.0 standard drinks of alcohol    Comment: socially   Drug use: No       OPHTHALMIC EXAM: Base Eye Exam     Visual Acuity (Snellen - Linear)       Right Left   Dist cc 20/20 -1 20/25 +2   Dist ph cc  20/20 -2    Correction: Glasses         Tonometry (Tonopen, 2:31 PM)       Right Left   Pressure 18 21         Pupils       Dark Light Shape React APD   Right 4 3 Round Brisk None   Left 4 3 Round Brisk None         Visual Fields (Counting fingers)       Left Right    Full Full         Extraocular Movement       Right Left  Full, Ortho Full, Ortho         Neuro/Psych     Oriented x3: Yes   Mood/Affect: Normal         Dilation     Both eyes: 1.0% Mydriacyl, 2.5% Phenylephrine @ 2:31 PM           Slit Lamp and Fundus Exam     Slit Lamp Exam       Right Left   Lids/Lashes Dermatochalasis - upper lid, mild Meibomian gland dysfunction Dermatochalasis - upper lid, mild Meibomian gland dysfunction   Conjunctiva/Sclera White and quiet  White and quiet   Cornea 2+Punctate epithelial erosions, mild tear film debris 3+fine Punctate epithelial erosions   Anterior Chamber deep and quiet deep and quiet   Iris round and dilated, No NVI round and dilated   Lens 1+ NS, 1+CC 1+NS, 1+CC   Anterior Vitreous mild vitreous syneresis; PVD; mild vit condensations mild vitreous syneresis, scattered vitreous condensations, silicone oil micro bubbles trapped within vit         Fundus Exam       Right Left   Disc Pink and sharp, +cupping Pink and sharp, +cupping   C/D Ratio 0.7 0.6   Macula good foveal reflex, focal exudates ST macula -- improved, scattered MA - improved, edema and punctate exudates temporal macula -- improved, focal laser changes inferior macula good foveal reflex, scatttered MA's and exudates, central cystic changes -- improved, focal cluster of exudates SN macula -- improving   Vessels attenuated, Tortuous attenuated, Tortuous   Periphery Attached, scattered MA/exudates greatest inferiorly, faint CWS nasal midzone Attached, scattered MA and exudates greatest posteriorly, white without pressure temporally           Refraction     Wearing Rx       Sphere Cylinder Axis   Right -4.25 +0.75 021   Left -4.25 +1.00 042    Type: SVL           IMAGING AND PROCEDURES  Imaging and Procedures for @TODAY @  OCT, Retina - OU - Both Eyes       Right Eye Quality was good. Central Foveal Thickness: 234. Progression has improved. Findings include normal foveal contour, no IRF, no SRF, intraretinal hyper-reflective material, vitreomacular adhesion (interval improvement in IRF/cystic changes temporal macula, scattered punctate IRHM ).   Left Eye Quality was good. Central Foveal Thickness: 238. Progression has improved. Findings include normal foveal contour, no SRF, intraretinal hyper-reflective material, intraretinal fluid (Mild interval improvement in IRF / IRHM temporal fovea and mac).   Notes *Images captured and  stored on drive  Diagnosis / Impression:  +DME OU OD: interval improvement in IRF/cystic changes temporal macula, scattered punctate IRHM  OS: Interval improvement in IRF / IRHM temporal fovea and mac  Clinical management:  See below  Abbreviations: NFP - Normal foveal profile. CME - cystoid macular edema. PED - pigment epithelial detachment. IRF - intraretinal fluid. SRF - subretinal fluid. EZ - ellipsoid zone. ERM - epiretinal membrane. ORA - outer retinal atrophy. ORT - outer retinal tubulation. SRHM - subretinal hyper-reflective material      Intravitreal Injection, Pharmacologic Agent - OD - Right Eye       Time Out 09/21/2023. 3:16 PM. Confirmed correct patient, procedure, site, and patient consented.   Anesthesia Topical anesthesia was used. Anesthetic medications included Lidocaine  2%, Proparacaine 0.5%.   Procedure Preparation included 5% betadine to ocular surface, eyelid speculum. A (32g) needle was used.   Injection: 2 mg aflibercept   2 MG/0.05ML   Route: Intravitreal, Site: Right Eye   NDC: [id number], Lot: [id number], Expiration date: 12/22/2024, Waste: 0 mL   Post-op Post injection exam found visual acuity of at least counting fingers. The patient tolerated the procedure well. There were no complications. The patient received written and verbal post procedure care education. Post injection medications were not given.            ASSESSMENT/PLAN:    ICD-10-CM   1. Moderate nonproliferative diabetic retinopathy of both eyes with macular edema associated with type 2 diabetes mellitus (HCC)  E11.3313 OCT, Retina - OU - Both Eyes    Intravitreal Injection, Pharmacologic Agent - OD - Right Eye    aflibercept  (EYLEA ) SOLN 2 mg    CANCELED: Intravitreal Injection, Pharmacologic Agent - OS - Left Eye    2. Current use of insulin  (HCC)  Z79.4     3. Long term (current) use of oral hypoglycemic drugs  Z79.84     4. Essential hypertension  I10     5.  Hypertensive retinopathy of both eyes  H35.033     6. Nuclear sclerosis of both eyes  H25.13     7. Dry eyes  H04.123      1-3.  Moderate non-proliferative diabetic retinopathy w/ DME, OU  - delayed f/u -- 11.5 wks instead of 4 (10.25.24 to 01.15.25)  - A1c 10.3 on 06.17.25  - pt lost to f/u from 10.23.2019 to 11.18.20  - FA in 2019 showed leaking MA OU; no NV OU **of note, pt had pruritic rxn to fluorescein  dye -- resolved with 12.5 mg IV benadryl **  - FA 12.16.2020 shows late leaking MA's, non-perfusion defects; no NV OU - s/p IVA OD #1 (12.16.20), #2 (01.25.21), #3 (05.26.21), #4 (07.02.21), #5 (10.15.21), #6 (11.23.21), #7 (07.27.22), #8 (09.09.22), #9 (11.11.22), #10 (01.20.23), #11 (02.23.23), #12 (04.09.23), #13 (05.23.23), #14 (06.30.23), #15 (08.04.23), #16 (09.01.23), #17 (10.06.23), #18 (11.10.23) #19 (12.15.23), #20 (01.19.24), #21 (02.22.24), #22 (04.09.24), #23 (06.28.24), #24 (07.29.24), #25 (09.27.24), #26 (10.25.24), #27 (04.02.25) -- IVA resistance - s/p IVA OS #1 (12.23.20), #2 (01.27.21), #3 (03.02.21), #4 (05.26.21), #5 (07.02.21), #6 (10.15.21), #7 (11.23.21), #8 (01.11.22), #9 (03.04.22), #10 (04.27.22), #11 (11.14.22), #12 (01.10.23), #13 (02.20.23), #14 (04.05.23), #15 (05.19.23), #16 (06.23.23), #17 (07.28.23), #18 (09.01.23), #19 (10.06.23), #20 (11.10.23) #21 (12.15.23), #22 (01.19.24), #23 (02.22.24), #24 (04.01.24), #25 (04.30.24), #26 (05.28.24), #27 (06.28.24) -- IVA Resistance - s/p IVE OD #1 (05.02.25) #2(05.30.25), #3 (06.27.25) ======================================  - s/p focal laser OD (03.19.21), (04.01.22) ====================================== - s/p IVE OS #1 (07.29.24), #2 (08.30.24), #3 (09.24.24), #4 (10.25.24), #5 (01.15.25) -- IVE resistance ====================================== - s/p IVV OS #1 (02.26.25), #2 (03.26.25), #3 (05.02.25), #4 (05.30.25), #5 (06.27.25) ======================================  - exam shows scattered MA, IRH, exudates  and mild macular edema OU  - BCVA OU 20/20 - OCT shows OD: interval improvement in IRF/cystic changes temporal macula, scattered punctate IRHM; OS: Interval improvement in IRF / IRHM temporal fovea and mac - Recommend IVE OD #3 today, 06.27.25 w/ f/u in 4 wks - pt prefers to only do OD today due to not having a driver  - pt wishes to proceed with injections - RBA of procedure discussed, questions answered - see procedure note  - [facility] informed consent form was signed and scanned on 02.26.25 (OU)  - Eylea  informed consent form was signed and scanned on 05.02.25 (OD)  - f/u August 8th, DFE, OCT, possible IVV OS  4,5. Hypertensive retinopathy OU  - discussed importance  of tight BP control             - continue to monitor   6. Nuclear Sclerosis  - The symptoms of cataract, surgical options, and treatments and risks were discussed with patient.   - discussed diagnosis and progression  - under the expert management of [facility]   7. Dry eyes OU - recommend artificial tears and lubricating ointment as needed  Return in about 9 days (around 09/30/2023) for f/u NPDR OU, OCT, possible injxn OS.   Ophthalmic Meds Ordered this visit:  Meds ordered this encounter  Medications   aflibercept  (EYLEA ) SOLN 2 mg    This document serves as a record of services personally performed by Redell JUDITHANN Hans, MD, PhD. It was created on their behalf by Avelina Pereyra, COA an ophthalmic technician. The creation of this record is the provider's dictation and/or activities during the visit.   Electronically signed by: Avelina GORMAN Pereyra, COT  09/21/23  9:36 PM   This document serves as a record of services personally performed by Redell JUDITHANN Hans, MD, PhD. It was created on their behalf by Alan PARAS. Delores, OA an ophthalmic technician. The creation of this record is the provider's dictation and/or activities during the visit.    Electronically signed by: Alan PARAS. Delores, OA 09/21/23 9:36 PM  Redell JUDITHANN Hans,  M.D., Ph.D. Diseases & Surgery of the Retina and Vitreous [facility]  I have reviewed the above documentation for accuracy and completeness, and I agree with the above. Redell JUDITHANN Hans, M.D., Ph.D. 09/21/23 9:38 PM   Abbreviations: M myopia (nearsighted); A astigmatism; H hyperopia (farsighted); P presbyopia; Mrx spectacle prescription;  CTL contact lenses; OD right eye; OS left eye; OU both eyes  XT exotropia; ET esotropia; PEK punctate epithelial keratitis; PEE punctate epithelial erosions; DES dry eye syndrome; MGD meibomian gland dysfunction; ATs artificial tears; PFAT's preservative free artificial tears; NSC nuclear sclerotic cataract; PSC posterior subcapsular cataract; ERM epi-retinal membrane; PVD posterior vitreous detachment; RD retinal detachment; DM diabetes mellitus; DR diabetic retinopathy; NPDR non-proliferative diabetic retinopathy; PDR proliferative diabetic retinopathy; CSME clinically significant macular edema; DME diabetic macular edema; dbh dot blot hemorrhages; CWS cotton wool spot; POAG primary open angle glaucoma; C/D cup-to-disc ratio; HVF humphrey visual field; GVF goldmann visual field; OCT optical coherence tomography; IOP intraocular pressure; BRVO Branch retinal vein occlusion; CRVO central retinal vein occlusion; CRAO central retinal artery occlusion; BRAO branch retinal artery occlusion; RT retinal tear; SB scleral buckle; PPV pars plana vitrectomy; VH Vitreous hemorrhage; PRP panretinal laser photocoagulation; IVK intravitreal kenalog ; VMT vitreomacular traction; MH Macular hole;  NVD neovascularization of the disc; NVE neovascularization elsewhere; AREDS age related eye disease study; ARMD age related macular degeneration; POAG primary open angle glaucoma; EBMD epithelial/anterior basement membrane dystrophy; ACIOL anterior chamber intraocular lens; IOL intraocular lens; PCIOL posterior chamber intraocular lens; Phaco/IOL phacoemulsification with  intraocular lens placement; PRK photorefractive keratectomy; LASIK laser assisted in situ keratomileusis; HTN hypertension; DM diabetes mellitus; COPD chronic obstructive pulmonary disease

## 2023-09-22 NOTE — Progress Notes (Signed)
 [facility] - Clinic Note  09/30/2023     CHIEF COMPLAINT Patient presents for Retina Follow Up  HISTORY OF PRESENT ILLNESS: Ana Long is a 48 y.o. female who presents to the clinic today for:   HPI     Retina Follow Up   Patient presents with  Diabetic Retinopathy.  In both eyes.  This started 4 weeks ago.  I, the attending physician,  performed the HPI with the patient and updated documentation appropriately.        Comments   OD treated 09/21/23, pt here for OS review. Pts no changes in vision. Pt denies FOL/new floaters/pain. Pt can only have OS dilated due to no driver.      Last edited by Valdemar Rogue, MD on 09/30/2023  4:25 PM.     Referring physician: Octavia Charlie Hamilton MD [address] [place], [place] [address]  HISTORICAL INFORMATION:   Selected notes from the MEDICAL RECORD NUMBER Re-referred by Dr. Hamilton Octavia for DM exam   CURRENT MEDICATIONS: Current Outpatient Medications (Ophthalmic Drugs)  Medication Sig   Olopatadine HCl 0.2 % SOLN Place one drop into both eyes daily.   No current facility-administered medications for this visit. (Ophthalmic Drugs)   Current Outpatient Medications (Other)  Medication Sig   acyclovir  (ZOVIRAX ) 400 MG tablet TAKE 1 TABLET BY MOUTH TWICE A DAY   albuterol  (PROAIR  HFA) 108 (90 Base) MCG/ACT inhaler Inhale 2 puffs into the lungs every 4 (four) hours as needed for wheezing or shortness of breath (cough). (Patient taking differently: Inhale 2 puffs into the lungs every 4 (four) hours as needed for wheezing or shortness of breath (or coughing).)   atorvastatin  (LIPITOR) 10 MG tablet Take 10 mg by mouth daily.   benzonatate  (TESSALON ) 200 MG capsule Take 1 capsule (200 mg total) by mouth 3 (three) times daily as needed for cough.   Blood Glucose Monitoring Suppl (ACCU-CHEK AVIVA PLUS) w/Device KIT Please check you blood sugars 4 times a day (Patient not taking: Reported on 09/21/2023)   CIPRODEX OTIC  suspension Place into the right ear.   cyclobenzaprine  (FLEXERIL ) 10 MG tablet Take 1 tablet (10 mg total) by mouth 3 (three) times daily as needed for muscle spasms.   DERMA-SMOOTHE/FS SCALP 0.01 % OIL Apply topically.   diclofenac  (VOLTAREN ) 75 MG EC tablet Take 1 tablet (75 mg total) by mouth 2 (two) times daily.   fluconazole  (DIFLUCAN ) 150 MG tablet TAKE 1 TABLET BY MOUTH NOW. THEN REPEAT IN 1 WEEK   furosemide  (LASIX ) 20 MG tablet Take 20 mg by mouth daily as needed for fluid or edema.   glucose blood (ACCU-CHEK SMARTVIEW) test strip TEST FOUR TIMES DAILY (Patient not taking: Reported on 09/21/2023)   HUMULIN  R U-500 KWIKPEN 500 UNIT/ML kwikpen Inject into the skin.   hydrochlorothiazide (HYDRODIURIL) 12.5 MG tablet Take by mouth.   Insulin  Pen Needle (B-D UF III MINI PEN NEEDLES) 31G X 5 MM MISC USE AS DIRECTED TO INJECT INSULIN  FIVE A DAY E11.65   lactulose  (CEPHULAC ) 10 g packet Take 1 packet (10 g total) by mouth daily as needed. Reported on 07/29/2015 (Patient taking differently: Take 10 g by mouth daily as needed (for constipation).)   Lancets (ACCU-CHEK SOFT TOUCH) lancets Use as instructed (Patient not taking: Reported on 09/21/2023)   loratadine  (CLARITIN ) 10 MG tablet Take 1 tablet (10 mg total) by mouth 2 (two) times daily.   mometasone -formoterol  (DULERA ) 200-5 MCG/ACT AERO Inhale 2 puffs into  the lungs 2 (two) times daily.   montelukast  (SINGULAIR ) 10 MG tablet Take 1 tablet (10 mg total) by mouth at bedtime.   nitroGLYCERIN  (NITRODUR - DOSED IN MG/24 HR) 0.2 mg/hr patch Apply 1/4th patch to affected achilles, change daily   omeprazole  (PRILOSEC) 20 MG capsule Take 1 capsule (20 mg total) by mouth daily before breakfast.   SPIRIVA RESPIMAT 1.25 MCG/ACT AERS SMARTSIG:2 Puff(s) Via Inhaler Daily   sulfamethoxazole -trimethoprim  (BACTRIM  DS) 800-160 MG tablet Take 1 tablet by mouth 2 (two) times daily.   topiramate  (TOPAMAX ) 25 MG tablet Take by mouth.   Current Facility-Administered  Medications (Other)  Medication Route   diphenhydrAMINE  (BENADRYL ) injection 12.5 mg Intramuscular   REVIEW OF SYSTEMS: ROS   Positive for: Endocrine, Eyes, Respiratory Negative for: Constitutional, Gastrointestinal, Neurological, Skin, Genitourinary, Musculoskeletal, HENT, Cardiovascular, Psychiatric, Allergic/Imm, Heme/Lymph Last edited by [name], COT on 09/30/2023  1:35 PM.      ALLERGIES Allergies  Allergen Reactions   Bee Venom Anaphylaxis and Swelling   Wasp Venom Anaphylaxis and Swelling   Citrus Hives and Itching   Latex Hives and Swelling   Penicillins Hives and Itching    Has patient had a PCN reaction causing immediate rash, facial/tongue/throat swelling, SOB or lightheadedness with hypotension: Yes Has patient had a PCN reaction causing severe rash involving mucus membranes or skin necrosis: Unk Has patient had a PCN reaction that required hospitalization: Was already in hosp Has patient had a PCN reaction occurring within the last 10 years: No If all of the above answers are NO, then may proceed with Cephalosporin use.    Shellfish Allergy Other (See Comments) and Swelling    Patient is uncertain; stated that she tolerates this (??)   Soap Hives, Itching and Swelling    PUREX LAUNDRY DETERGENT   Tomato Hives and Itching   Banana Itching and Nausea Only   Chocolate Itching   Fluticasone  Other (See Comments)    Per patient, makes sneeze and gag   Hydrocodone  Itching and Nausea Only   Other Itching    Acidic foods- Itching [place]- Itching in the roof of the mouth   Adhesive [Tape] Itching and Rash    Allergic to  leads   Doxycycline Other (See Comments)    AFFECTS JOINTS   Liraglutide Nausea Only   Metformin  And Related Nausea Only, Rash, Other (See Comments) and Diarrhea    GI UPSET, also   Red Dye #40 (Allura Red) Itching, Rash and Other (See Comments)    At eye doctor's office, the dilating drops- Skin breaks out, chest turned red, itching,  face became swollen   PAST MEDICAL HISTORY 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   Cataract    NS OU   Complication of anesthesia    states is hard to wake up post-op   Constipation 12/30/2014   Diabetes mellitus    intolerant to Metformin    Diabetic retinopathy (HCC)    NPDR OU   Dry skin    hands    Family history of anesthesia complication    mother went into coma during c-section   Genital herpes 01/01/2006   on acyclovir  BID    GERD (gastroesophageal reflux disease)    Herpes  genital    Hyperglycemia 12/26/2017   Hypertensive retinopathy    OU   Low back pain 11/22/2012   Lower extremity edema 10/18/2012   Menorrhagia 11/06/2015   Morbid obesity with BMI of 50 to 60 07/23/2009   Obesity hypoventilation syndrome (HCC) 10/30/2012   Obesity, morbid (HCC) 01/10/2014   OSA on CPAP     AHI was on  11-17-12 to 120/hr, titrated to 15 cm with 3 cm EPR/    PCOS (polycystic ovarian syndrome)    Rheumatoid arthritis (HCC)    Right ankle pain 06/19/2015   Right hip pain 09/04/2017   Right shoulder pain 06/01/2017   Seasonal allergies    Sleep apnea, obstructive    uses CPAP nightly - had sleep study 08/22/2007   Past Surgical History:  Procedure Laterality Date   CARPAL TUNNEL RELEASE Right 10/02/2012   Procedure: RIGHT CARPAL TUNNEL RELEASE ENDOSCOPIC;  Surgeon: Alm DELENA Hummer, MD;  Location: [facility];  Service: Orthopedics;  Laterality: Right;   CHOLECYSTECTOMY N/A 12/02/2014   Procedure: LAPAROSCOPIC CHOLECYSTECTOMY;  Surgeon: Krystal Spinner, MD;  Location: [facility];  Service: General;  Laterality: N/A;   HYSTEROSCOPY WITH D & C  01-08-2002   FAMILY HISTORY Family History  Problem Relation Age of Onset   Other Mother     Diabetes Maternal Aunt    Heart attack Maternal Aunt    Alzheimer's disease Maternal Uncle    Cancer Maternal Uncle        prostate   Diabetes Paternal Aunt    Cancer Paternal Aunt        brain,mouth   Alzheimer's disease Paternal Aunt    Colon polyps Paternal Uncle    Diabetes Paternal Uncle    Diabetes Maternal Grandmother    Diabetes Maternal Grandfather    Leukemia Cousin    Other Other    Obesity Other    Other Other    Colitis Neg Hx    SOCIAL HISTORY Social History   Tobacco Use   Smoking status: Former    Current packs/day: 0.00    Types: Cigarettes    Start date: 05/23/2000    Quit date: 05/24/2010    Years since quitting: 13.3   Smokeless tobacco: Never  Vaping Use   Vaping status: Never Used  Substance Use Topics   Alcohol use: Yes    Alcohol/week: 0.0 standard drinks of alcohol    Comment: socially   Drug use: No       OPHTHALMIC EXAM: Base Eye Exam     Visual Acuity (Snellen - Linear)       Right Left   Dist cc 20/20 -2 20/25 -2   Dist ph cc NI 20/20 -2    Correction: Glasses         Tonometry (Tonopen, 1:30 PM)       Right Left   Pressure 18 19         Pupils       Pupils Dark Light Shape React APD   Right PERRL 4 3 Round Brisk None   Left PERRL 4 3 Round Brisk None         Visual Fields       Left Right    Full Full         Extraocular Movement       Right Left    Full Full         Neuro/Psych     Oriented x3: Yes   Mood/Affect:  Normal         Dilation     Left eye: 1.0% Mydriacyl, 2.5% Phenylephrine @ 1:31 PM           Slit Lamp and Fundus Exam     Slit Lamp Exam       Right Left   Lids/Lashes Dermatochalasis - upper lid, mild Meibomian gland dysfunction Dermatochalasis - upper lid, mild Meibomian gland dysfunction   Conjunctiva/Sclera White and quiet White and quiet   Cornea 2+Punctate epithelial erosions, mild tear film debris 3+fine Punctate epithelial erosions   Anterior Chamber deep and  quiet deep and quiet   Iris round and dilated, No NVI round and dilated   Lens 1+ NS, 1+CC 1+NS, 1+CC   Anterior Vitreous mild vitreous syneresis; PVD; mild vit condensations mild vitreous syneresis, scattered vitreous condensations, silicone oil micro bubbles trapped within vit         Fundus Exam       Right Left   Disc Pink and sharp, +cupping Pink and sharp, +cupping   C/D Ratio 0.7 0.6   Macula good foveal reflex, focal exudates ST macula -- improved, scattered MA - improved, edema and punctate exudates temporal macula -- improved, focal laser changes inferior macula good foveal reflex, scatttered MA's and exudates, central cystic changes -- improved, focal cluster of exudates SN macula -- improving   Vessels attenuated, Tortuous attenuated, Tortuous   Periphery Attached, scattered MA/exudates greatest inferiorly, faint CWS nasal midzone Attached, scattered MA and exudates greatest posteriorly, white without pressure temporally           Refraction     Wearing Rx       Sphere Cylinder Axis   Right -4.25 +0.75 021   Left -4.25 +1.00 042    Type: SVL           IMAGING AND PROCEDURES  Imaging and Procedures for @TODAY @  OCT, Retina - OU - Both Eyes       Right Eye Quality was good. Central Foveal Thickness: 224. Progression has improved. Findings include normal foveal contour, no SRF, intraretinal hyper-reflective material, intraretinal fluid, vitreomacular adhesion (Mild interval improvement in IRF/cystic changes temporal macula, scattered punctate IRHM ).   Left Eye Quality was good. Central Foveal Thickness: 244. Progression has improved. Findings include normal foveal contour, no SRF, intraretinal hyper-reflective material, intraretinal fluid (Mild interval improvement in IRF / IRHM temporal fovea and mac).   Notes *Images captured and stored on drive  Diagnosis / Impression:  +DME OU OD: interval improvement in IRF/cystic changes temporal macula, scattered  punctate IRHM  OS: Interval improvement in IRF / IRHM temporal fovea and mac  Clinical management:  See below  Abbreviations: NFP - Normal foveal profile. CME - cystoid macular edema. PED - pigment epithelial detachment. IRF - intraretinal fluid. SRF - subretinal fluid. EZ - ellipsoid zone. ERM - epiretinal membrane. ORA - outer retinal atrophy. ORT - outer retinal tubulation. SRHM - subretinal hyper-reflective material      Intravitreal Injection, Pharmacologic Agent - OS - Left Eye       Time Out 09/30/2023. 1:58 PM. Confirmed correct patient, procedure, site, and patient consented.   Anesthesia Topical anesthesia was used. Anesthetic medications included Lidocaine  2%, Tetracaine 0.5%.   Procedure Preparation included 5% betadine to ocular surface, eyelid speculum. A supplied needle was used.   Injection: 6 mg faricimab -svoa 6 MG/0.05ML   Route: Intravitreal, Site: Left Eye   NDC: 49757-903-93, Lot: [id number], Expiration date: 09/20/2024, Waste: 0 mL  Post-op Post injection exam found visual acuity of at least counting fingers. The patient tolerated the procedure well. There were no complications. The patient received written and verbal post procedure care education. Post injection medications were not given.            ASSESSMENT/PLAN:    ICD-10-CM   1. Moderate nonproliferative diabetic retinopathy of both eyes with macular edema associated with type 2 diabetes mellitus (HCC)  E11.3313 OCT, Retina - OU - Both Eyes    Intravitreal Injection, Pharmacologic Agent - OS - Left Eye    faricimab -svoa (VABYSMO ) 6mg /0.70mL intravitreal injection    2. Current use of insulin  (HCC)  Z79.4     3. Long term (current) use of oral hypoglycemic drugs  Z79.84     4. Essential hypertension  I10     5. Hypertensive retinopathy of both eyes  H35.033     6. Nuclear sclerosis of both eyes  H25.13     7. Dry eyes  H04.123       1-3.  Moderate non-proliferative diabetic retinopathy  w/ DME, OU  - delayed f/u -- 11.5 wks instead of 4 (10.25.24 to 01.15.25)  - A1c 10.3 on 06.17.25  - pt lost to f/u from 10.23.2019 to 11.18.20  - FA in 2019 showed leaking MA OU; no NV OU **of note, pt had pruritic rxn to fluorescein  dye -- resolved with 12.5 mg IV benadryl **  - FA 12.16.2020 shows late leaking MA's, non-perfusion defects; no NV OU - s/p IVA OD #1 (12.16.20), #2 (01.25.21), #3 (05.26.21), #4 (07.02.21), #5 (10.15.21), #6 (11.23.21), #7 (07.27.22), #8 (09.09.22), #9 (11.11.22), #10 (01.20.23), #11 (02.23.23), #12 (04.09.23), #13 (05.23.23), #14 (06.30.23), #15 (08.04.23), #16 (09.01.23), #17 (10.06.23), #18 (11.10.23) #19 (12.15.23), #20 (01.19.24), #21 (02.22.24), #22 (04.09.24), #23 (06.28.24), #24 (07.29.24), #25 (09.27.24), #26 (10.25.24), #27 (04.02.25) -- IVA resistance - s/p IVA OS #1 (12.23.20), #2 (01.27.21), #3 (03.02.21), #4 (05.26.21), #5 (07.02.21), #6 (10.15.21), #7 (11.23.21), #8 (01.11.22), #9 (03.04.22), #10 (04.27.22), #11 (11.14.22), #12 (01.10.23), #13 (02.20.23), #14 (04.05.23), #15 (05.19.23), #16 (06.23.23), #17 (07.28.23), #18 (09.01.23), #19 (10.06.23), #20 (11.10.23) #21 (12.15.23), #22 (01.19.24), #23 (02.22.24), #24 (04.01.24), #25 (04.30.24), #26 (05.28.24), #27 (06.28.24) -- IVA Resistance - s/p IVE OD #1 (05.02.25) #2(05.30.25), #3 (06.27.25), #4 (07.30.25) ======================================  - s/p focal laser OD (03.19.21), (04.01.22) ====================================== - s/p IVE OS #1 (07.29.24), #2 (08.30.24), #3 (09.24.24), #4 (10.25.24), #5 (01.15.25) -- IVE resistance ====================================== - s/p IVV OS #1 (02.26.25), #2 (03.26.25), #3 (05.02.25), #4 (05.30.25), #5 (06.27.25) ======================================  - exam shows scattered MA, IRH, exudates and mild macular edema OU  - BCVA OU 20/20 - OCT shows OD: interval improvement in IRF/cystic changes temporal macula, scattered punctate IRHM; OS: Interval improvement  in IRF / IRHM temporal fovea and mac - Recommend IVV OS #6 today, 08.08.25 w/ f/u in 4 wks  - pt wishes to proceed with injection - RBA of procedure discussed, questions answered - see procedure note  - Vabsymo informed consent form was signed and scanned on 02.26.25 (OU)  - Eylea  informed consent form was signed and scanned on 05.02.25 (OD)  - f/u 4 weeks - DFE/OCT, possible injxns  4,5. Hypertensive retinopathy OU  - discussed importance of tight BP control             - continue to monitor   6. Nuclear Sclerosis  - The symptoms of cataract, surgical options, and treatments and risks were discussed with patient.   - discussed diagnosis and progression  -  under the expert management of Dr. [facility]  7. Dry eyes OU - recommend artificial tears and lubricating ointment as needed  Return in 4 weeks (on 10/28/2023) for +DME OU - DFE, OCT, Possible Injxn.   Ophthalmic Meds Ordered this visit:  Meds ordered this encounter  Medications   faricimab -svoa (VABYSMO ) 6mg /0.66mL intravitreal injection    This document serves as a record of services personally performed by Redell JUDITHANN Hans, MD, PhD. It was created on their behalf by Avelina Pereyra, COA an ophthalmic technician. The creation of this record is the provider's dictation and/or activities during the visit.   Electronically signed by: Avelina GORMAN Pereyra, COT  10/05/23  1:38 AM   This document serves as a record of services personally performed by Redell JUDITHANN Hans, MD, PhD. It was created on their behalf by Auston Muzzy, COMT. The creation of this record is the provider's dictation and/or activities during the visit.  Electronically signed by: Auston Muzzy, COMT 10/05/23 1:38 AM  Redell JUDITHANN Hans, M.D., Ph.D. Diseases & Surgery of the Retina and Vitreous [facility]  I have reviewed the above documentation for accuracy and completeness, and I agree with the above. Redell JUDITHANN Hans, M.D., Ph.D. 10/05/23 1:40 AM    Abbreviations: M myopia (nearsighted); A astigmatism; H hyperopia (farsighted); P presbyopia; Mrx spectacle prescription;  CTL contact lenses; OD right eye; OS left eye; OU both eyes  XT exotropia; ET esotropia; PEK punctate epithelial keratitis; PEE punctate epithelial erosions; DES dry eye syndrome; MGD meibomian gland dysfunction; ATs artificial tears; PFAT's preservative free artificial tears; NSC nuclear sclerotic cataract; PSC posterior subcapsular cataract; ERM epi-retinal membrane; PVD posterior vitreous detachment; RD retinal detachment; DM diabetes mellitus; DR diabetic retinopathy; NPDR non-proliferative diabetic retinopathy; PDR proliferative diabetic retinopathy; CSME clinically significant macular edema; DME diabetic macular edema; dbh dot blot hemorrhages; CWS cotton wool spot; POAG primary open angle glaucoma; C/D cup-to-disc ratio; HVF humphrey visual field; GVF goldmann visual field; OCT optical coherence tomography; IOP intraocular pressure; BRVO Branch retinal vein occlusion; CRVO central retinal vein occlusion; CRAO central retinal artery occlusion; BRAO branch retinal artery occlusion; RT retinal tear; SB scleral buckle; PPV pars plana vitrectomy; VH Vitreous hemorrhage; PRP panretinal laser photocoagulation; IVK intravitreal kenalog ; VMT vitreomacular traction; MH Macular hole;  NVD neovascularization of the disc; NVE neovascularization elsewhere; AREDS age related eye disease study; ARMD age related macular degeneration; POAG primary open angle glaucoma; EBMD epithelial/anterior basement membrane dystrophy; ACIOL anterior chamber intraocular lens; IOL intraocular lens; PCIOL posterior chamber intraocular lens; Phaco/IOL phacoemulsification with intraocular lens placement; PRK photorefractive keratectomy; LASIK laser assisted in situ keratomileusis; HTN hypertension; DM diabetes mellitus; COPD chronic obstructive pulmonary disease

## 2023-09-30 ENCOUNTER — Encounter (INDEPENDENT_AMBULATORY_CARE_PROVIDER_SITE_OTHER): Payer: Self-pay | Admitting: Ophthalmology

## 2023-09-30 ENCOUNTER — Ambulatory Visit (INDEPENDENT_AMBULATORY_CARE_PROVIDER_SITE_OTHER): Admitting: Ophthalmology

## 2023-09-30 DIAGNOSIS — E113313 Type 2 diabetes mellitus with moderate nonproliferative diabetic retinopathy with macular edema, bilateral: Secondary | ICD-10-CM | POA: Diagnosis not present

## 2023-09-30 DIAGNOSIS — Z7984 Long term (current) use of oral hypoglycemic drugs: Secondary | ICD-10-CM

## 2023-09-30 DIAGNOSIS — I10 Essential (primary) hypertension: Secondary | ICD-10-CM | POA: Diagnosis not present

## 2023-09-30 DIAGNOSIS — Z794 Long term (current) use of insulin: Secondary | ICD-10-CM | POA: Diagnosis not present

## 2023-09-30 DIAGNOSIS — H04123 Dry eye syndrome of bilateral lacrimal glands: Secondary | ICD-10-CM

## 2023-09-30 DIAGNOSIS — H2513 Age-related nuclear cataract, bilateral: Secondary | ICD-10-CM

## 2023-09-30 DIAGNOSIS — H35033 Hypertensive retinopathy, bilateral: Secondary | ICD-10-CM

## 2023-09-30 MED ORDER — FARICIMAB-SVOA 6 MG/0.05ML IZ SOSY
6.0000 mg | PREFILLED_SYRINGE | INTRAVITREAL | Status: AC | PRN
Start: 2023-09-30 — End: 2023-09-30
  Administered 2023-09-30: 6 mg via INTRAVITREAL

## 2023-10-10 ENCOUNTER — Ambulatory Visit: Admitting: Family Medicine

## 2023-10-10 VITALS — BP 116/71 | Ht 62.0 in | Wt 312.0 lb

## 2023-10-10 DIAGNOSIS — G8929 Other chronic pain: Secondary | ICD-10-CM

## 2023-10-10 DIAGNOSIS — M545 Low back pain, unspecified: Secondary | ICD-10-CM

## 2023-10-10 MED ORDER — PREDNISONE 10 MG PO TABS: ORAL_TABLET | ORAL | 0 refills | Status: AC

## 2023-10-10 NOTE — Patient Instructions (Signed)
 You have facet arthritis of your low back. Stop the ibuprofen  for now and take the prednisone  dose pack as directed. Heat 15 minutes at a time as needed. Topical diclofenac  or capsaicin may help. Let me know how you're doing in 1 week.

## 2023-10-11 ENCOUNTER — Encounter: Payer: Self-pay | Admitting: Family Medicine

## 2023-10-11 NOTE — Progress Notes (Signed)
 PCP: Rena Luke POUR, MD  Subjective:   HPI: Patient is a 48 y.o. female here for back pain.  Patient reports she's had worsening pain of her low back. Worse when lifting her legs up but felt primarily in the back. Hard to move around. Taking ibuprofen  800mg  as needed. Using heating pad, hot shower, getting massages. She's having difficulty with her asthma currently.  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   Cataract    NS OU   Complication of anesthesia    states is hard to wake up post-op   Constipation 12/30/2014   Diabetes mellitus    intolerant to Metformin    Diabetic retinopathy (HCC)    NPDR OU   Dry skin    hands    Family history of anesthesia complication    mother went into coma during c-section   Genital herpes 01/01/2006   on acyclovir  BID    GERD (gastroesophageal reflux disease)    Herpes genital    Hyperglycemia 12/26/2017   Hypertensive retinopathy    OU   Low back pain 11/22/2012   Lower extremity edema 10/18/2012   Menorrhagia 11/06/2015   Morbid obesity with BMI of 50 to 60 07/23/2009   Obesity hypoventilation syndrome (HCC) 10/30/2012   Obesity, morbid (HCC) 01/10/2014   OSA on CPAP     AHI was on  11-17-12 to 120/hr, titrated to 15 cm with 3 cm EPR/    PCOS (polycystic ovarian syndrome)    Rheumatoid arthritis (HCC)    Right ankle pain 06/19/2015   Right hip pain 09/04/2017   Right shoulder pain 06/01/2017   Seasonal allergies    Sleep apnea, obstructive    uses CPAP nightly - had sleep study 08/22/2007    Current Outpatient Medications on File Prior to Visit  Medication Sig Dispense Refill   acyclovir  (ZOVIRAX ) 400 MG tablet TAKE 1 TABLET BY MOUTH TWICE A DAY 60 tablet 2   albuterol  (PROAIR  HFA) 108 (90  Base) MCG/ACT inhaler Inhale 2 puffs into the lungs every 4 (four) hours as needed for wheezing or shortness of breath (cough). (Patient taking differently: Inhale 2 puffs into the lungs every 4 (four) hours as needed for wheezing or shortness of breath (or coughing).) 6.7 g 2   atorvastatin  (LIPITOR) 10 MG tablet Take 10 mg by mouth daily.     benzonatate  (TESSALON ) 200 MG capsule Take 1 capsule (200 mg total) by mouth 3 (three) times daily as needed for cough. 20 capsule 0   Blood Glucose Monitoring Suppl (ACCU-CHEK AVIVA PLUS) w/Device KIT Please check you blood sugars 4 times a day (Patient not taking: Reported on 09/21/2023) 1 kit 0   CIPRODEX OTIC suspension Place into the right ear.     cyclobenzaprine  (FLEXERIL ) 10 MG tablet Take 1 tablet (10 mg total) by mouth 3 (three) times daily as needed for muscle spasms. 60 tablet 1   DERMA-SMOOTHE/FS SCALP 0.01 % OIL Apply topically.     diclofenac  (VOLTAREN ) 75 MG EC tablet Take 1 tablet (75 mg total) by mouth 2 (two) times daily. 60 tablet 1   fluconazole  (DIFLUCAN ) 150 MG tablet TAKE 1 TABLET BY MOUTH NOW. THEN REPEAT IN 1 WEEK     furosemide  (  LASIX ) 20 MG tablet Take 20 mg by mouth daily as needed for fluid or edema.     glucose blood (ACCU-CHEK SMARTVIEW) test strip TEST FOUR TIMES DAILY (Patient not taking: Reported on 09/21/2023) 150 each 6   HUMULIN  R U-500 KWIKPEN 500 UNIT/ML kwikpen Inject into the skin.     hydrochlorothiazide (HYDRODIURIL) 12.5 MG tablet Take by mouth.     Insulin  Pen Needle (B-D UF III MINI PEN NEEDLES) 31G X 5 MM MISC USE AS DIRECTED TO INJECT INSULIN  FIVE A DAY E11.65 100 each 6   lactulose  (CEPHULAC ) 10 g packet Take 1 packet (10 g total) by mouth daily as needed. Reported on 07/29/2015 (Patient taking differently: Take 10 g by mouth daily as needed (for constipation).) 30 each 0   Lancets (ACCU-CHEK SOFT TOUCH) lancets Use as instructed (Patient not taking: Reported on 09/21/2023) 100 each 12   loratadine  (CLARITIN ) 10 MG  tablet Take 1 tablet (10 mg total) by mouth 2 (two) times daily. 180 tablet 1   mometasone -formoterol  (DULERA ) 200-5 MCG/ACT AERO Inhale 2 puffs into the lungs 2 (two) times daily. 13 Inhaler 1   montelukast  (SINGULAIR ) 10 MG tablet Take 1 tablet (10 mg total) by mouth at bedtime. 90 tablet 1   nitroGLYCERIN  (NITRODUR - DOSED IN MG/24 HR) 0.2 mg/hr patch Apply 1/4th patch to affected achilles, change daily 30 patch 1   Olopatadine HCl 0.2 % SOLN Place one drop into both eyes daily.     omeprazole  (PRILOSEC) 20 MG capsule Take 1 capsule (20 mg total) by mouth daily before breakfast. 90 capsule 2   SPIRIVA RESPIMAT 1.25 MCG/ACT AERS SMARTSIG:2 Puff(s) Via Inhaler Daily     sulfamethoxazole -trimethoprim  (BACTRIM  DS) 800-160 MG tablet Take 1 tablet by mouth 2 (two) times daily. 20 tablet 0   topiramate  (TOPAMAX ) 25 MG tablet Take by mouth.     Current Facility-Administered Medications on File Prior to Visit  Medication Dose Route Frequency Provider Last Rate Last Admin   diphenhydrAMINE  (BENADRYL ) injection 12.5 mg  12.5 mg Intramuscular Once Valdemar Rogue, MD        Past Surgical History:  Procedure Laterality Date   CARPAL TUNNEL RELEASE Right 10/02/2012   Procedure: RIGHT CARPAL TUNNEL RELEASE ENDOSCOPIC;  Surgeon: Alm DELENA Hummer, MD;  Location: [facility];  Service: Orthopedics;  Laterality: Right;   CHOLECYSTECTOMY N/A 12/02/2014   Procedure: LAPAROSCOPIC CHOLECYSTECTOMY;  Surgeon: Krystal Spinner, MD;  Location: [facility];  Service: General;  Laterality: N/A;   HYSTEROSCOPY WITH D & C  01-08-2002    Allergies  Allergen Reactions   Bee Venom Anaphylaxis and Swelling   Wasp Venom Anaphylaxis and Swelling   Citrus Hives and Itching   Latex Hives and Swelling   Penicillins Hives and Itching    Has patient had a PCN reaction causing immediate rash, facial/tongue/throat swelling, SOB or lightheadedness with hypotension: Yes Has patient had a PCN reaction causing severe rash  involving mucus membranes or skin necrosis: Unk Has patient had a PCN reaction that required hospitalization: Was already in hosp Has patient had a PCN reaction occurring within the last 10 years: No If all of the above answers are NO, then may proceed with Cephalosporin use.    Shellfish Allergy Other (See Comments) and Swelling    Patient is uncertain; stated that she tolerates this (??)   Soap Hives, Itching and Swelling    PUREX LAUNDRY DETERGENT   Tomato Hives and Itching   Banana Itching and Nausea Only  Chocolate Itching   Fluticasone  Other (See Comments)    Per patient, makes sneeze and gag   Hydrocodone  Itching and Nausea Only   Other Itching    Acidic foods- Itching [place]- Itching in the roof of the mouth   Adhesive [Tape] Itching and Rash    Allergic to  leads   Doxycycline Other (See Comments)    AFFECTS JOINTS   Liraglutide Nausea Only   Metformin  And Related Nausea Only, Rash, Other (See Comments) and Diarrhea    GI UPSET, also   Red Dye #40 (Allura Red) Itching, Rash and Other (See Comments)    At eye doctor's office, the dilating drops- Skin breaks out, chest turned red, itching, face became swollen    BP 116/71   Ht 5' 2 (1.575 m)   Wt (!) 312 lb (141.5 kg)   BMI 57.07 kg/m       No data to display              No data to display              Objective:  Physical Exam:  Gen: NAD, comfortable in exam room  Back: No gross deformity, scoliosis. TTP bilateral paraspinal lumbar region.  No midline or bony TTP. Very limited motion extension and flexion. Strength LEs 5/5 all muscle groups.   Trace MSRs in patellar and achilles tendons, equal bilaterally. Negative SLRs. Sensation intact to light touch bilaterally.   Assessment & Plan:  1. 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  No evidence radiculopathy.  Discussed options - will start with prednisone  dose pack (should incidentally help her  asthma symptoms as well).  Topical medications, heat, tylenol  if needed.  Let us  know how she's doing in 1 week.

## 2023-10-19 ENCOUNTER — Ambulatory Visit: Admitting: Family Medicine

## 2023-10-19 VITALS — BP 116/67 | Ht 62.0 in | Wt 312.0 lb

## 2023-10-19 DIAGNOSIS — M5441 Lumbago with sciatica, right side: Secondary | ICD-10-CM | POA: Diagnosis not present

## 2023-10-19 DIAGNOSIS — M47819 Spondylosis without myelopathy or radiculopathy, site unspecified: Secondary | ICD-10-CM

## 2023-10-19 DIAGNOSIS — G8929 Other chronic pain: Secondary | ICD-10-CM

## 2023-10-19 MED ORDER — TRAMADOL HCL 50 MG PO TABS: 50.0000 mg | ORAL_TABLET | Freq: Four times a day (QID) | ORAL | 0 refills | Status: AC | PRN

## 2023-10-19 NOTE — Patient Instructions (Addendum)
 We will go ahead with a referral for facet injection on the right side at L4-5. If you don't hear from them in 2 days you can call directly at [phone] to schedule. Take tylenol  500mg  1-2 tabs three times a day as needed. Tramadol  as needed for severe pain. Aleve  1-2 tabs twice a day with food for pain and inflammation. Let us  know how you're doing about a week after the injection. If not improving still would consider referral to a back surgeon.

## 2023-10-20 ENCOUNTER — Encounter: Payer: Self-pay | Admitting: Family Medicine

## 2023-10-20 ENCOUNTER — Other Ambulatory Visit: Payer: Self-pay | Admitting: Family Medicine

## 2023-10-20 DIAGNOSIS — M8929 Other disorders of bone development and growth, multiple sites: Secondary | ICD-10-CM

## 2023-10-20 DIAGNOSIS — M5441 Lumbago with sciatica, right side: Secondary | ICD-10-CM

## 2023-10-20 DIAGNOSIS — M47819 Spondylosis without myelopathy or radiculopathy, site unspecified: Secondary | ICD-10-CM

## 2023-10-20 NOTE — Progress Notes (Signed)
 PCP: Rena Luke POUR, MD  Subjective:   HPI: Patient is a 48 y.o. female here for back pain.  8/18: Patient reports she's had worsening pain of her low back. Worse when lifting her legs up but felt primarily in the back. Hard to move around. Taking ibuprofen  800mg  as needed. Using heating pad, hot shower, getting massages. She's having difficulty with her asthma currently.  8/27: Patient states prednisone  helped initially for first 2 days or so but pain has worsened since then. Feels on right side with numbness locally. Worse with twisting, using right leg like when pressing the brake pedal. Cannot lie on right side. Tried topical medication and tramadol  as well as the prednisone .  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   Cataract    NS OU   Complication of anesthesia    states is hard to wake up post-op   Constipation 12/30/2014   Diabetes mellitus    intolerant to Metformin    Diabetic retinopathy (HCC)    NPDR OU   Dry skin    hands    Family history of anesthesia complication    mother went into coma during c-section   Genital herpes 01/01/2006   on acyclovir  BID    GERD (gastroesophageal reflux disease)    Herpes genital    Hyperglycemia 12/26/2017   Hypertensive retinopathy    OU   Low back pain 11/22/2012   Lower extremity edema 10/18/2012   Menorrhagia 11/06/2015   Morbid obesity with BMI of 50 to 60 07/23/2009   Obesity hypoventilation syndrome (HCC) 10/30/2012   Obesity, morbid (HCC) 01/10/2014   OSA on CPAP     AHI was on  11-17-12 to 120/hr, titrated to 15 cm with 3 cm EPR/    PCOS (polycystic ovarian syndrome)    Rheumatoid arthritis (HCC)    Right ankle pain 06/19/2015   Right hip pain 09/04/2017   Right shoulder  pain 06/01/2017   Seasonal allergies    Sleep apnea, obstructive    uses CPAP nightly - had sleep study 08/22/2007    Current Outpatient Medications on File Prior to Visit  Medication Sig Dispense Refill   acyclovir  (ZOVIRAX ) 400 MG tablet TAKE 1 TABLET BY MOUTH TWICE A DAY 60 tablet 2   albuterol  (PROAIR  HFA) 108 (90 Base) MCG/ACT inhaler Inhale 2 puffs into the lungs every 4 (four) hours as needed for wheezing or shortness of breath (cough). (Patient taking differently: Inhale 2 puffs into the lungs every 4 (four) hours as needed for wheezing or shortness of breath (or coughing).) 6.7 g 2   atorvastatin  (LIPITOR) 10 MG tablet Take 10 mg by mouth daily.     benzonatate  (TESSALON ) 200 MG capsule Take 1 capsule (200 mg total) by mouth 3 (three) times daily as needed for cough. 20 capsule 0   Blood Glucose Monitoring Suppl (ACCU-CHEK AVIVA PLUS) w/Device KIT Please check you blood sugars 4 times a day (Patient not taking: Reported on 09/21/2023) 1 kit 0   CIPRODEX OTIC suspension Place into the right ear.     cyclobenzaprine  (FLEXERIL ) 10 MG tablet Take 1 tablet (10 mg total) by mouth 3 (three) times daily as needed for muscle spasms. 60 tablet 1   DERMA-SMOOTHE/FS  SCALP 0.01 % OIL Apply topically.     diclofenac  (VOLTAREN ) 75 MG EC tablet Take 1 tablet (75 mg total) by mouth 2 (two) times daily. 60 tablet 1   fluconazole  (DIFLUCAN ) 150 MG tablet TAKE 1 TABLET BY MOUTH NOW. THEN REPEAT IN 1 WEEK     furosemide  (LASIX ) 20 MG tablet Take 20 mg by mouth daily as needed for fluid or edema.     glucose blood (ACCU-CHEK SMARTVIEW) test strip TEST FOUR TIMES DAILY (Patient not taking: Reported on 09/21/2023) 150 each 6   HUMULIN  R U-500 KWIKPEN 500 UNIT/ML kwikpen Inject into the skin.     hydrochlorothiazide (HYDRODIURIL) 12.5 MG tablet Take by mouth.     Insulin  Pen Needle (B-D UF III MINI PEN NEEDLES) 31G X 5 MM MISC USE AS DIRECTED TO INJECT INSULIN  FIVE A DAY E11.65 100 each 6   lactulose  (CEPHULAC )  10 g packet Take 1 packet (10 g total) by mouth daily as needed. Reported on 07/29/2015 (Patient taking differently: Take 10 g by mouth daily as needed (for constipation).) 30 each 0   Lancets (ACCU-CHEK SOFT TOUCH) lancets Use as instructed (Patient not taking: Reported on 09/21/2023) 100 each 12   loratadine  (CLARITIN ) 10 MG tablet Take 1 tablet (10 mg total) by mouth 2 (two) times daily. 180 tablet 1   mometasone -formoterol  (DULERA ) 200-5 MCG/ACT AERO Inhale 2 puffs into the lungs 2 (two) times daily. 13 Inhaler 1   montelukast  (SINGULAIR ) 10 MG tablet Take 1 tablet (10 mg total) by mouth at bedtime. 90 tablet 1   nitroGLYCERIN  (NITRODUR - DOSED IN MG/24 HR) 0.2 mg/hr patch Apply 1/4th patch to affected achilles, change daily 30 patch 1   Olopatadine HCl 0.2 % SOLN Place one drop into both eyes daily.     omeprazole  (PRILOSEC) 20 MG capsule Take 1 capsule (20 mg total) by mouth daily before breakfast. 90 capsule 2   predniSONE  (DELTASONE ) 10 MG tablet 6 tabs po day 1, 5 tabs po day 2, 4 tabs po day 3, 3 tabs po day 4, 2 tabs po day 5, 1 tab po day 6 21 tablet 0   SPIRIVA RESPIMAT 1.25 MCG/ACT AERS SMARTSIG:2 Puff(s) Via Inhaler Daily     sulfamethoxazole -trimethoprim  (BACTRIM  DS) 800-160 MG tablet Take 1 tablet by mouth 2 (two) times daily. 20 tablet 0   topiramate  (TOPAMAX ) 25 MG tablet Take by mouth.     Current Facility-Administered Medications on File Prior to Visit  Medication Dose Route Frequency Provider Last Rate Last Admin   diphenhydrAMINE  (BENADRYL ) injection 12.5 mg  12.5 mg Intramuscular Once Valdemar Rogue, MD        Past Surgical History:  Procedure Laterality Date   CARPAL TUNNEL RELEASE Right 10/02/2012   Procedure: RIGHT CARPAL TUNNEL RELEASE ENDOSCOPIC;  Surgeon: Alm DELENA Hummer, MD;  Location: [facility];  Service: Orthopedics;  Laterality: Right;   CHOLECYSTECTOMY N/A 12/02/2014   Procedure: LAPAROSCOPIC CHOLECYSTECTOMY;  Surgeon: Krystal Spinner, MD;  Location:  [facility];  Service: General;  Laterality: N/A;   HYSTEROSCOPY WITH D & C  01-08-2002    Allergies  Allergen Reactions   Bee Venom Anaphylaxis and Swelling   Wasp Venom Anaphylaxis and Swelling   Citrus Hives and Itching   Latex Hives and Swelling   Penicillins Hives and Itching    Has patient had a PCN reaction causing immediate rash, facial/tongue/throat swelling, SOB or lightheadedness with hypotension: Yes Has patient had a PCN reaction causing severe rash involving  mucus membranes or skin necrosis: Unk Has patient had a PCN reaction that required hospitalization: Was already in hosp Has patient had a PCN reaction occurring within the last 10 years: No If all of the above answers are NO, then may proceed with Cephalosporin use.    Shellfish Allergy Other (See Comments) and Swelling    Patient is uncertain; stated that she tolerates this (??)   Soap Hives, Itching and Swelling    PUREX LAUNDRY DETERGENT   Tomato Hives and Itching   Banana Itching and Nausea Only   Chocolate Itching   Fluticasone  Other (See Comments)    Per patient, makes sneeze and gag   Hydrocodone  Itching and Nausea Only   Other Itching    Acidic foods- Itching [place]- Itching in the roof of the mouth   Adhesive [Tape] Itching and Rash    Allergic to  leads   Doxycycline Other (See Comments)    AFFECTS JOINTS   Liraglutide Nausea Only   Metformin  And Related Nausea Only, Rash, Other (See Comments) and Diarrhea    GI UPSET, also   Red Dye #40 (Allura Red) Itching, Rash and Other (See Comments)    At eye doctor's office, the dilating drops- Skin breaks out, chest turned red, itching, face became swollen    BP 116/67   Ht 5' 2 (1.575 m)   Wt (!) 312 lb (141.5 kg)   BMI 57.07 kg/m       No data to display              No data to display              Objective:  Physical Exam:  Gen: NAD, mildly uncomfortable in exam room seated in chair  Back: No gross deformity, scoliosis. TTP  bilateral paraspinal regions.  No midline or bony TTP. Did not test ROM today. Strength LEs 5/5 all muscle groups.   Positive SLR on right. Sensation intact to light touch bilaterally.   Assessment & Plan:  1. Low back pain - prednisone  only helped temporarily.  She has known severe facet arthritis at L4-5 - will proceed with facet injection on right at this level.  Tylenol , tramadol , aleve .  If not improving would refer to neurosurgery.

## 2023-10-26 NOTE — Progress Notes (Shared)
 [facility] - Clinic Note  10/28/2023     CHIEF COMPLAINT Patient presents for No chief complaint on file.  HISTORY OF PRESENT ILLNESS: Ana Long is a 48 y.o. female who presents to the clinic today for:     Referring physician: Octavia Charlie Hamilton MD [address] [place], [place] [address]  HISTORICAL INFORMATION:   Selected notes from the MEDICAL RECORD NUMBER Re-referred by Dr. Hamilton Octavia for DM exam   CURRENT MEDICATIONS: Current Outpatient Medications (Ophthalmic Drugs)  Medication Sig   Olopatadine HCl 0.2 % SOLN Place one drop into both eyes daily.   No current facility-administered medications for this visit. (Ophthalmic Drugs)   Current Outpatient Medications (Other)  Medication Sig   acyclovir  (ZOVIRAX ) 400 MG tablet TAKE 1 TABLET BY MOUTH TWICE A DAY   albuterol  (PROAIR  HFA) 108 (90 Base) MCG/ACT inhaler Inhale 2 puffs into the lungs every 4 (four) hours as needed for wheezing or shortness of breath (cough). (Patient taking differently: Inhale 2 puffs into the lungs every 4 (four) hours as needed for wheezing or shortness of breath (or coughing).)   atorvastatin  (LIPITOR) 10 MG tablet Take 10 mg by mouth daily.   benzonatate  (TESSALON ) 200 MG capsule Take 1 capsule (200 mg total) by mouth 3 (three) times daily as needed for cough.   Blood Glucose Monitoring Suppl (ACCU-CHEK AVIVA PLUS) w/Device KIT Please check you blood sugars 4 times a day (Patient not taking: Reported on 09/21/2023)   CIPRODEX OTIC suspension Place into the right ear.   cyclobenzaprine  (FLEXERIL ) 10 MG tablet Take 1 tablet (10 mg total) by mouth 3 (three) times daily as needed for muscle spasms.   DERMA-SMOOTHE/FS SCALP 0.01 % OIL Apply topically.   diclofenac  (VOLTAREN ) 75 MG EC tablet Take 1 tablet (75 mg total) by mouth 2 (two) times daily.   fluconazole  (DIFLUCAN ) 150 MG tablet TAKE 1 TABLET BY MOUTH NOW. THEN REPEAT IN 1 WEEK   furosemide  (LASIX ) 20 MG tablet Take  20 mg by mouth daily as needed for fluid or edema.   glucose blood (ACCU-CHEK SMARTVIEW) test strip TEST FOUR TIMES DAILY (Patient not taking: Reported on 09/21/2023)   HUMULIN  R U-500 KWIKPEN 500 UNIT/ML kwikpen Inject into the skin.   hydrochlorothiazide (HYDRODIURIL) 12.5 MG tablet Take by mouth.   Insulin  Pen Needle (B-D UF III MINI PEN NEEDLES) 31G X 5 MM MISC USE AS DIRECTED TO INJECT INSULIN  FIVE A DAY E11.65   lactulose  (CEPHULAC ) 10 g packet Take 1 packet (10 g total) by mouth daily as needed. Reported on 07/29/2015 (Patient taking differently: Take 10 g by mouth daily as needed (for constipation).)   Lancets (ACCU-CHEK SOFT TOUCH) lancets Use as instructed (Patient not taking: Reported on 09/21/2023)   loratadine  (CLARITIN ) 10 MG tablet Take 1 tablet (10 mg total) by mouth 2 (two) times daily.   mometasone -formoterol  (DULERA ) 200-5 MCG/ACT AERO Inhale 2 puffs into the lungs 2 (two) times daily.   montelukast  (SINGULAIR ) 10 MG tablet Take 1 tablet (10 mg total) by mouth at bedtime.   nitroGLYCERIN  (NITRODUR - DOSED IN MG/24 HR) 0.2 mg/hr patch Apply 1/4th patch to affected achilles, change daily   omeprazole  (PRILOSEC) 20 MG capsule Take 1 capsule (20 mg total) by mouth daily before breakfast.   predniSONE  (DELTASONE ) 10 MG tablet 6 tabs po day 1, 5 tabs po day 2, 4 tabs po day 3, 3 tabs po day 4, 2 tabs po day 5, 1 tab  po day 6   SPIRIVA RESPIMAT 1.25 MCG/ACT AERS SMARTSIG:2 Puff(s) Via Inhaler Daily   sulfamethoxazole -trimethoprim  (BACTRIM  DS) 800-160 MG tablet Take 1 tablet by mouth 2 (two) times daily.   topiramate  (TOPAMAX ) 25 MG tablet Take by mouth.   traMADol  (ULTRAM ) 50 MG tablet Take 1 tablet (50 mg total) by mouth every 6 (six) hours as needed.   Current Facility-Administered Medications (Other)  Medication Route   diphenhydrAMINE  (BENADRYL ) injection 12.5 mg Intramuscular   REVIEW OF SYSTEMS:    ALLERGIES Allergies  Allergen Reactions   Bee Venom Anaphylaxis and Swelling    Wasp Venom Anaphylaxis and Swelling   Citrus Hives and Itching   Latex Hives and Swelling   Penicillins Hives and Itching    Has patient had a PCN reaction causing immediate rash, facial/tongue/throat swelling, SOB or lightheadedness with hypotension: Yes Has patient had a PCN reaction causing severe rash involving mucus membranes or skin necrosis: Unk Has patient had a PCN reaction that required hospitalization: Was already in hosp Has patient had a PCN reaction occurring within the last 10 years: No If all of the above answers are NO, then may proceed with Cephalosporin use.    Shellfish Allergy Other (See Comments) and Swelling    Patient is uncertain; stated that she tolerates this (??)   Soap Hives, Itching and Swelling    PUREX LAUNDRY DETERGENT   Tomato Hives and Itching   Banana Itching and Nausea Only   Chocolate Itching   Fluticasone  Other (See Comments)    Per patient, makes sneeze and gag   Hydrocodone  Itching and Nausea Only   Other Itching    Acidic foods- Itching [place]- Itching in the roof of the mouth   Adhesive [Tape] Itching and Rash    Allergic to  leads   Doxycycline Other (See Comments)    AFFECTS JOINTS   Liraglutide Nausea Only   Metformin  And Related Nausea Only, Rash, Other (See Comments) and Diarrhea    GI UPSET, also   Red Dye #40 (Allura Red) Itching, Rash and Other (See Comments)    At eye doctor's office, the dilating drops- Skin breaks out, chest turned red, itching, face became swollen   PAST MEDICAL HISTORY 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   Cataract    NS OU   Complication of anesthesia    states is hard to wake up post-op   Constipation 12/30/2014   Diabetes mellitus     intolerant to Metformin    Diabetic retinopathy (HCC)    NPDR OU   Dry skin    hands    Family history of anesthesia complication    mother went into coma during c-section   Genital herpes 01/01/2006   on acyclovir  BID    GERD (gastroesophageal reflux disease)    Herpes genital    Hyperglycemia 12/26/2017   Hypertensive retinopathy    OU   Low back pain 11/22/2012   Lower extremity edema 10/18/2012   Menorrhagia 11/06/2015   Morbid obesity with BMI of 50 to 60 07/23/2009   Obesity hypoventilation syndrome (HCC) 10/30/2012   Obesity, morbid (HCC) 01/10/2014   OSA on CPAP     AHI was on  11-17-12 to 120/hr, titrated to  15 cm with 3 cm EPR/    PCOS (polycystic ovarian syndrome)    Rheumatoid arthritis (HCC)    Right ankle pain 06/19/2015   Right hip pain 09/04/2017   Right shoulder pain 06/01/2017   Seasonal allergies    Sleep apnea, obstructive    uses CPAP nightly - had sleep study 08/22/2007   Past Surgical History:  Procedure Laterality Date   CARPAL TUNNEL RELEASE Right 10/02/2012   Procedure: RIGHT CARPAL TUNNEL RELEASE ENDOSCOPIC;  Surgeon: Alm DELENA Hummer, MD;  Location: [facility];  Service: Orthopedics;  Laterality: Right;   CHOLECYSTECTOMY N/A 12/02/2014   Procedure: LAPAROSCOPIC CHOLECYSTECTOMY;  Surgeon: Krystal Spinner, MD;  Location: [facility];  Service: General;  Laterality: N/A;   HYSTEROSCOPY WITH D & C  01-08-2002   FAMILY HISTORY Family History  Problem Relation Age of Onset   Other Mother    Diabetes Maternal Aunt    Heart attack Maternal Aunt    Alzheimer's disease Maternal Uncle    Cancer Maternal Uncle        prostate   Diabetes Paternal Aunt    Cancer Paternal Aunt        brain,mouth   Alzheimer's disease Paternal Aunt    Colon polyps Paternal Uncle    Diabetes Paternal Uncle    Diabetes Maternal Grandmother    Diabetes Maternal Grandfather    Leukemia Cousin    Other Other    Obesity Other    Other Other    Colitis Neg Hx    SOCIAL  HISTORY Social History   Tobacco Use   Smoking status: Former    Current packs/day: 0.00    Types: Cigarettes    Start date: 05/23/2000    Quit date: 05/24/2010    Years since quitting: 13.4   Smokeless tobacco: Never  Vaping Use   Vaping status: Never Used  Substance Use Topics   Alcohol use: Yes    Alcohol/week: 0.0 standard drinks of alcohol    Comment: socially   Drug use: No       OPHTHALMIC EXAM: Not recorded    IMAGING AND PROCEDURES  Imaging and Procedures for @TODAY @          ASSESSMENT/PLAN:  No diagnosis found.   1-3.  Moderate non-proliferative diabetic retinopathy w/ DME, OU  - delayed f/u -- 11.5 wks instead of 4 (10.25.24 to 01.15.25)  - A1c 10.3 on 06.17.25  - pt lost to f/u from 10.23.2019 to 11.18.20  - FA in 2019 showed leaking MA OU; no NV OU **of note, pt had pruritic rxn to fluorescein  dye -- resolved with 12.5 mg IV benadryl **  - FA 12.16.2020 shows late leaking MA's, non-perfusion defects; no NV OU - s/p IVA OD #1 (12.16.20), #2 (01.25.21), #3 (05.26.21), #4 (07.02.21), #5 (10.15.21), #6 (11.23.21), #7 (07.27.22), #8 (09.09.22), #9 (11.11.22), #10 (01.20.23), #11 (02.23.23), #12 (04.09.23), #13 (05.23.23), #14 (06.30.23), #15 (08.04.23), #16 (09.01.23), #17 (10.06.23), #18 (11.10.23) #19 (12.15.23), #20 (01.19.24), #21 (02.22.24), #22 (04.09.24), #23 (06.28.24), #24 (07.29.24), #25 (09.27.24), #26 (10.25.24), #27 (04.02.25) -- IVA resistance - s/p IVA OS #1 (12.23.20), #2 (01.27.21), #3 (03.02.21), #4 (05.26.21), #5 (07.02.21), #6 (10.15.21), #7 (11.23.21), #8 (01.11.22), #9 (03.04.22), #10 (04.27.22), #11 (11.14.22), #12 (01.10.23), #13 (02.20.23), #14 (04.05.23), #15 (05.19.23), #16 (06.23.23), #17 (07.28.23), #18 (09.01.23), #19 (10.06.23), #20 (11.10.23) #21 (12.15.23), #22 (01.19.24), #23 (02.22.24), #24 (04.01.24), #25 (04.30.24), #26 (05.28.24), #27 (06.28.24) -- IVA Resistance - s/p IVE OD #1 (05.02.25) #2(05.30.25), #3 (06.27.25), #4  (  07.30.25) ======================================  - s/p focal laser OD (03.19.21), (04.01.22) ====================================== - s/p IVE OS #1 (07.29.24), #2 (08.30.24), #3 (09.24.24), #4 (10.25.24), #5 (01.15.25) -- IVE resistance ====================================== - s/p IVV OS #1 (02.26.25), #2 (03.26.25), #3 (05.02.25), #4 (05.30.25), #5 (06.27.25) #6(08.08.25) ======================================  - exam shows scattered MA, IRH, exudates and mild macular edema OU  - BCVA OU 20/20 - OCT shows OD: interval improvement in IRF/cystic changes temporal macula, scattered punctate IRHM; OS: Interval improvement in IRF / IRHM temporal fovea and mac - Recommend IVV OS #7 today, 09.05.25 w/ f/u in 4 wks  - pt wishes to proceed with injection - RBA of procedure discussed, questions answered - see procedure note  - [facility] informed consent form was signed and scanned on 02.26.25 (OU)  - [facility]  informed consent form was signed and scanned on 05.02.25 (OD)  - f/u 4 weeks - DFE/OCT, possible injxns  4,5. Hypertensive retinopathy OU  - discussed importance of tight BP control             - continue to monitor   6. Nuclear Sclerosis  - The symptoms of cataract, surgical options, and treatments and risks were discussed with patient.   - discussed diagnosis and progression  - under the expert management of [facility]  7. Dry eyes OU - recommend artificial tears and lubricating ointment as needed  No follow-ups on file.   Ophthalmic Meds Ordered this visit:  No orders of the defined types were placed in this encounter.   This document serves as a record of services personally performed by Redell JUDITHANN Hans, MD, PhD. It was created on their behalf by Delon Newness COT, an ophthalmic technician. The creation of this record is the provider's dictation and/or activities during the visit.    Electronically signed by: Delon Newness COT 09.03.202510:10  AM   Abbreviations: M myopia (nearsighted); A astigmatism; H hyperopia (farsighted); P presbyopia; Mrx spectacle prescription;  CTL contact lenses; OD right eye; OS left eye; OU both eyes  XT exotropia; ET esotropia; PEK punctate epithelial keratitis; PEE punctate epithelial erosions; DES dry eye syndrome; MGD meibomian gland dysfunction; ATs artificial tears; PFAT's preservative free artificial tears; NSC nuclear sclerotic cataract; PSC posterior subcapsular cataract; ERM epi-retinal membrane; PVD posterior vitreous detachment; RD retinal detachment; DM diabetes mellitus; DR diabetic retinopathy; NPDR non-proliferative diabetic retinopathy; PDR proliferative diabetic retinopathy; CSME clinically significant macular edema; DME diabetic macular edema; dbh dot blot hemorrhages; CWS cotton wool spot; POAG primary open angle glaucoma; C/D cup-to-disc ratio; HVF humphrey visual field; GVF goldmann visual field; OCT optical coherence tomography; IOP intraocular pressure; BRVO Branch retinal vein occlusion; CRVO central retinal vein occlusion; CRAO central retinal artery occlusion; BRAO branch retinal artery occlusion; RT retinal tear; SB scleral buckle; PPV pars plana vitrectomy; VH Vitreous hemorrhage; PRP panretinal laser photocoagulation; IVK intravitreal kenalog ; VMT vitreomacular traction; MH Macular hole;  NVD neovascularization of the disc; NVE neovascularization elsewhere; AREDS age related eye disease study; ARMD age related macular degeneration; POAG primary open angle glaucoma; EBMD epithelial/anterior basement membrane dystrophy; ACIOL anterior chamber intraocular lens; IOL intraocular lens; PCIOL posterior chamber intraocular lens; Phaco/IOL phacoemulsification with intraocular lens placement; PRK photorefractive keratectomy; LASIK laser assisted in situ keratomileusis; HTN hypertension; DM diabetes mellitus; COPD chronic obstructive pulmonary disease

## 2023-10-28 ENCOUNTER — Encounter (INDEPENDENT_AMBULATORY_CARE_PROVIDER_SITE_OTHER): Admitting: Ophthalmology

## 2023-10-28 DIAGNOSIS — I10 Essential (primary) hypertension: Secondary | ICD-10-CM

## 2023-10-28 DIAGNOSIS — Z7984 Long term (current) use of oral hypoglycemic drugs: Secondary | ICD-10-CM

## 2023-10-28 DIAGNOSIS — E113313 Type 2 diabetes mellitus with moderate nonproliferative diabetic retinopathy with macular edema, bilateral: Secondary | ICD-10-CM

## 2023-10-28 DIAGNOSIS — H35033 Hypertensive retinopathy, bilateral: Secondary | ICD-10-CM

## 2023-10-28 DIAGNOSIS — Z794 Long term (current) use of insulin: Secondary | ICD-10-CM

## 2023-10-28 DIAGNOSIS — H2513 Age-related nuclear cataract, bilateral: Secondary | ICD-10-CM

## 2023-10-28 DIAGNOSIS — H04123 Dry eye syndrome of bilateral lacrimal glands: Secondary | ICD-10-CM

## 2023-10-31 NOTE — Progress Notes (Signed)
 [facility] - Clinic Note  11/01/2023     CHIEF COMPLAINT Patient presents for Retina Follow Up  HISTORY OF PRESENT ILLNESS: Ana Long is a 48 y.o. female who presents to the clinic today for:   HPI     Retina Follow Up   Patient presents with  Diabetic Retinopathy.  In both eyes.  This started 4 weeks ago.  Duration of weeks.  Since onset it is stable.        Comments   4 week retina follow up and IVV pt is reporting no vision changes has some floaters but  denies any flashes       Last edited by Resa Delon ORN, COT on 11/01/2023  1:01 PM.     Pt states states OS is sore. Couldn't see the letters good today. Some new floaters, got 'knocked' in the eye.   Referring physician: Octavia Charlie Hamilton MD [address] [place], [place] [address]  HISTORICAL INFORMATION:   Selected notes from the MEDICAL RECORD NUMBER Re-referred by Dr. Hamilton Octavia for DM exam   CURRENT MEDICATIONS: Current Outpatient Medications (Ophthalmic Drugs)  Medication Sig   Olopatadine HCl 0.2 % SOLN Place one drop into both eyes daily.   No current facility-administered medications for this visit. (Ophthalmic Drugs)   Current Outpatient Medications (Other)  Medication Sig   acyclovir  (ZOVIRAX ) 400 MG tablet TAKE 1 TABLET BY MOUTH TWICE A DAY   albuterol  (PROAIR  HFA) 108 (90 Base) MCG/ACT inhaler Inhale 2 puffs into the lungs every 4 (four) hours as needed for wheezing or shortness of breath (cough). (Patient taking differently: Inhale 2 puffs into the lungs every 4 (four) hours as needed for wheezing or shortness of breath (or coughing).)   atorvastatin  (LIPITOR) 10 MG tablet Take 10 mg by mouth daily.   benzonatate  (TESSALON ) 200 MG capsule Take 1 capsule (200 mg total) by mouth 3 (three) times daily as needed for cough.   Blood Glucose Monitoring Suppl (ACCU-CHEK AVIVA PLUS) w/Device KIT Please check you blood sugars 4 times a day   CIPRODEX OTIC suspension Place  into the right ear.   cyclobenzaprine  (FLEXERIL ) 10 MG tablet Take 1 tablet (10 mg total) by mouth 3 (three) times daily as needed for muscle spasms.   DERMA-SMOOTHE/FS SCALP 0.01 % OIL Apply topically.   diclofenac  (VOLTAREN ) 75 MG EC tablet Take 1 tablet (75 mg total) by mouth 2 (two) times daily.   fluconazole  (DIFLUCAN ) 150 MG tablet TAKE 1 TABLET BY MOUTH NOW. THEN REPEAT IN 1 WEEK   furosemide  (LASIX ) 20 MG tablet Take 20 mg by mouth daily as needed for fluid or edema.   glucose blood (ACCU-CHEK SMARTVIEW) test strip TEST FOUR TIMES DAILY   HUMULIN  R U-500 KWIKPEN 500 UNIT/ML kwikpen Inject into the skin.   hydrochlorothiazide (HYDRODIURIL) 12.5 MG tablet Take by mouth.   Insulin  Pen Needle (B-D UF III MINI PEN NEEDLES) 31G X 5 MM MISC USE AS DIRECTED TO INJECT INSULIN  FIVE A DAY E11.65   lactulose  (CEPHULAC ) 10 g packet Take 1 packet (10 g total) by mouth daily as needed. Reported on 07/29/2015 (Patient taking differently: Take 10 g by mouth daily as needed (for constipation).)   Lancets (ACCU-CHEK SOFT TOUCH) lancets Use as instructed   loratadine  (CLARITIN ) 10 MG tablet Take 1 tablet (10 mg total) by mouth 2 (two) times daily.   mometasone -formoterol  (DULERA ) 200-5 MCG/ACT AERO Inhale 2 puffs into the lungs 2 (two) times daily.  montelukast  (SINGULAIR ) 10 MG tablet Take 1 tablet (10 mg total) by mouth at bedtime.   nitroGLYCERIN  (NITRODUR - DOSED IN MG/24 HR) 0.2 mg/hr patch Apply 1/4th patch to affected achilles, change daily   omeprazole  (PRILOSEC) 20 MG capsule Take 1 capsule (20 mg total) by mouth daily before breakfast.   predniSONE  (DELTASONE ) 10 MG tablet 6 tabs po day 1, 5 tabs po day 2, 4 tabs po day 3, 3 tabs po day 4, 2 tabs po day 5, 1 tab po day 6   SPIRIVA RESPIMAT 1.25 MCG/ACT AERS SMARTSIG:2 Puff(s) Via Inhaler Daily   sulfamethoxazole -trimethoprim  (BACTRIM  DS) 800-160 MG tablet Take 1 tablet by mouth 2 (two) times daily.   topiramate  (TOPAMAX ) 25 MG tablet Take by mouth.    traMADol  (ULTRAM ) 50 MG tablet Take 1 tablet (50 mg total) by mouth every 6 (six) hours as needed.   Current Facility-Administered Medications (Other)  Medication Route   diphenhydrAMINE  (BENADRYL ) injection 12.5 mg Intramuscular   REVIEW OF SYSTEMS: ROS   Positive for: Endocrine, Eyes, Respiratory Negative for: Constitutional, Gastrointestinal, Neurological, Skin, Genitourinary, Musculoskeletal, HENT, Cardiovascular, Psychiatric, Allergic/Imm, Heme/Lymph Last edited by Resa Delon ORN, COT on 11/01/2023  1:01 PM.       ALLERGIES Allergies  Allergen Reactions   Bee Venom Anaphylaxis and Swelling   Wasp Venom Anaphylaxis and Swelling   Citrus Hives and Itching   Latex Hives and Swelling   Penicillins Hives and Itching    Has patient had a PCN reaction causing immediate rash, facial/tongue/throat swelling, SOB or lightheadedness with hypotension: Yes Has patient had a PCN reaction causing severe rash involving mucus membranes or skin necrosis: Unk Has patient had a PCN reaction that required hospitalization: Was already in hosp Has patient had a PCN reaction occurring within the last 10 years: No If all of the above answers are NO, then may proceed with Cephalosporin use.    Shellfish Allergy Other (See Comments) and Swelling    Patient is uncertain; stated that she tolerates this (??)   Soap Hives, Itching and Swelling    PUREX LAUNDRY DETERGENT   Tomato Hives and Itching   Banana Itching and Nausea Only   Chocolate Itching   Fluticasone  Other (See Comments)    Per patient, makes sneeze and gag   Hydrocodone  Itching and Nausea Only   Other Itching    Acidic foods- Itching [place]- Itching in the roof of the mouth   Adhesive [Tape] Itching and Rash    Allergic to  leads   Doxycycline Other (See Comments)    AFFECTS JOINTS   Liraglutide Nausea Only   Metformin  And Related Nausea Only, Rash, Other (See Comments) and Diarrhea    GI UPSET, also   Red Dye #40  (Allura Red) Itching, Rash and Other (See Comments)    At eye doctor's office, the dilating drops- Skin breaks out, chest turned red, itching, face became swollen   PAST MEDICAL HISTORY 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   Cataract    NS OU   Complication of anesthesia    states is hard to wake up post-op   Constipation 12/30/2014   Diabetes mellitus    intolerant to Metformin    Diabetic retinopathy (HCC)  NPDR OU   Dry skin    hands    Family history of anesthesia complication    mother went into coma during c-section   Genital herpes 01/01/2006   on acyclovir  BID    GERD (gastroesophageal reflux disease)    Herpes genital    Hyperglycemia 12/26/2017   Hypertensive retinopathy    OU   Low back pain 11/22/2012   Lower extremity edema 10/18/2012   Menorrhagia 11/06/2015   Morbid obesity with BMI of 50 to 60 07/23/2009   Obesity hypoventilation syndrome (HCC) 10/30/2012   Obesity, morbid (HCC) 01/10/2014   OSA on CPAP     AHI was on  11-17-12 to 120/hr, titrated to 15 cm with 3 cm EPR/    PCOS (polycystic ovarian syndrome)    Rheumatoid arthritis (HCC)    Right ankle pain 06/19/2015   Right hip pain 09/04/2017   Right shoulder pain 06/01/2017   Seasonal allergies    Sleep apnea, obstructive    uses CPAP nightly - had sleep study 08/22/2007   Past Surgical History:  Procedure Laterality Date   CARPAL TUNNEL RELEASE Right 10/02/2012   Procedure: RIGHT CARPAL TUNNEL RELEASE ENDOSCOPIC;  Surgeon: Alm DELENA Hummer, MD;  Location: [facility];  Service: Orthopedics;  Laterality: Right;   CHOLECYSTECTOMY N/A 12/02/2014   Procedure: LAPAROSCOPIC CHOLECYSTECTOMY;  Surgeon: Krystal Spinner, MD;  Location: [facility];  Service: General;   Laterality: N/A;   HYSTEROSCOPY WITH D & C  01-08-2002   FAMILY HISTORY Family History  Problem Relation Age of Onset   Other Mother    Diabetes Maternal Aunt    Heart attack Maternal Aunt    Alzheimer's disease Maternal Uncle    Cancer Maternal Uncle        prostate   Diabetes Paternal Aunt    Cancer Paternal Aunt        brain,mouth   Alzheimer's disease Paternal Aunt    Colon polyps Paternal Uncle    Diabetes Paternal Uncle    Diabetes Maternal Grandmother    Diabetes Maternal Grandfather    Leukemia Cousin    Other Other    Obesity Other    Other Other    Colitis Neg Hx    SOCIAL HISTORY Social History   Tobacco Use   Smoking status: Former    Current packs/day: 0.00    Types: Cigarettes    Start date: 05/23/2000    Quit date: 05/24/2010    Years since quitting: 13.4   Smokeless tobacco: Never  Vaping Use   Vaping status: Never Used  Substance Use Topics   Alcohol use: Yes    Alcohol/week: 0.0 standard drinks of alcohol    Comment: socially   Drug use: No       OPHTHALMIC EXAM: Base Eye Exam     Visual Acuity (Snellen - Linear)       Right Left   Dist cc 20/25 -1 20/25 -2   Dist ph cc 20/20 -2 NI    Correction: Glasses         Tonometry (Tonopen, 1:07 PM)       Right Left   Pressure 18 18         Pupils       Pupils Dark Light Shape React APD   Right PERRL 4 3 Round Brisk None   Left PERRL 4 3 Round Brisk None         Visual Fields       Left  Right    Full Full         Extraocular Movement       Right Left    Full, Ortho Full, Ortho         Neuro/Psych     Oriented x3: Yes   Mood/Affect: Normal         Dilation     Both eyes: 2.5% Phenylephrine @ 1:07 PM           Slit Lamp and Fundus Exam     Slit Lamp Exam       Right Left   Lids/Lashes Dermatochalasis - upper lid, mild Meibomian gland dysfunction Dermatochalasis - upper lid, mild Meibomian gland dysfunction   Conjunctiva/Sclera White and quiet White  and quiet   Cornea 2+Punctate epithelial erosions, mild tear film debris 3+fine Punctate epithelial erosions   Anterior Chamber deep and quiet deep and quiet   Iris round and dilated, No NVI round and dilated   Lens 1+ NS, 1+CC 1+NS, 1+CC   Anterior Vitreous mild vitreous syneresis; PVD; mild vit condensations mild vitreous syneresis, scattered vitreous condensations, silicone oil micro bubbles trapped within vit         Fundus Exam       Right Left   Disc Pink and sharp, +cupping Pink and sharp, +cupping   C/D Ratio 0.7 0.6   Macula good foveal reflex, focal exudates ST macula -- improved, scattered MA - improved, edema and punctate exudates temporal macula -- improved, focal laser changes inferior macula good foveal reflex, scatttered MA's and exudates, central cystic changes -- improved, focal cluster of exudates SN macula -- improving   Vessels attenuated, Tortuous attenuated, Tortuous   Periphery Attached, scattered MA/exudates greatest inferiorly, faint CWS nasal midzone Attached, scattered MA and exudates greatest posteriorly, white without pressure temporally           Refraction     Wearing Rx       Sphere Cylinder Axis   Right -4.25 +0.75 021   Left -4.25 +1.00 042    Type: SVL           IMAGING AND PROCEDURES  Imaging and Procedures for @TODAY @          ASSESSMENT/PLAN:    ICD-10-CM   1. Moderate nonproliferative diabetic retinopathy of both eyes with macular edema associated with type 2 diabetes mellitus (HCC)  E11.3313 OCT, Retina - OU - Both Eyes    2. Current use of insulin  (HCC)  Z79.4     3. Long term (current) use of oral hypoglycemic drugs  Z79.84     4. Essential hypertension  I10     5. Hypertensive retinopathy of both eyes  H35.033     6. Nuclear sclerosis of both eyes  H25.13     7. Dry eyes  H04.123        1-3.  Moderate non-proliferative diabetic retinopathy w/ DME, OU  - delayed f/u -- 11.5 wks instead of 4 (10.25.24 to  01.15.25)  - A1c 10.3 on 06.17.25  - pt lost to f/u from 10.23.2019 to 11.18.20  - FA in 2019 showed leaking MA OU; no NV OU **of note, pt had pruritic rxn to fluorescein  dye -- resolved with 12.5 mg IV benadryl **  - FA 12.16.2020 shows late leaking MA's, non-perfusion defects; no NV OU - s/p IVA OD #1 (12.16.20), #2 (01.25.21), #3 (05.26.21), #4 (07.02.21), #5 (10.15.21), #6 (11.23.21), #7 (07.27.22), #8 (09.09.22), #9 (11.11.22), #10 (01.20.23), #11 (02.23.23), #12 (04.09.23), #13 (  05.23.23), #14 (06.30.23), #15 (08.04.23), #16 (09.01.23), #17 (10.06.23), #18 (11.10.23) #19 (12.15.23), #20 (01.19.24), #21 (02.22.24), #22 (04.09.24), #23 (06.28.24), #24 (07.29.24), #25 (09.27.24), #26 (10.25.24), #27 (04.02.25) -- IVA resistance - s/p IVA OS #1 (12.23.20), #2 (01.27.21), #3 (03.02.21), #4 (05.26.21), #5 (07.02.21), #6 (10.15.21), #7 (11.23.21), #8 (01.11.22), #9 (03.04.22), #10 (04.27.22), #11 (11.14.22), #12 (01.10.23), #13 (02.20.23), #14 (04.05.23), #15 (05.19.23), #16 (06.23.23), #17 (07.28.23), #18 (09.01.23), #19 (10.06.23), #20 (11.10.23) #21 (12.15.23), #22 (01.19.24), #23 (02.22.24), #24 (04.01.24), #25 (04.30.24), #26 (05.28.24), #27 (06.28.24) -- IVA Resistance - s/p IVE OD #1 (05.02.25) #2(05.30.25), #3 (06.27.25), #4 (07.30.25) ======================================  - s/p focal laser OD (03.19.21), (04.01.22) ====================================== - s/p IVE OS #1 (07.29.24), #2 (08.30.24), #3 (09.24.24), #4 (10.25.24), #5 (01.15.25) -- IVE resistance ====================================== - s/p IVV OS #1 (02.26.25), #2 (03.26.25), #3 (05.02.25), #4 (05.30.25), #5 (06.27.25), #6 (08.08.25) ======================================  - exam shows scattered MA, IRH, exudates and mild macular edema OU  - BCVA OU 20/25-down from 20/20 - OCT shows OD: interval improvement in IRF/cystic changes temporal macula, scattered punctate IRHM; OS: Interval improvement in IRF / IRHM temporal fovea and  mac - Recommend iVE OD #5 today 09.09.25--pt can only do 1 eye today due to no transportation - pt wishes to proceed with injection - RBA of procedure discussed, questions answered - see procedure note  - [facility] informed consent form was signed and scanned on 02.26.25 (OU)  - [facility]  informed consent form was signed and scanned on 05.02.25 (OD)  - f/u Thurs @ 930 am IVV OS, then 4-5wks- DFE/OCT, possible injxns  4,5. Hypertensive retinopathy OU  - discussed importance of tight BP control             - continue to monitor   6. Nuclear Sclerosis  - The symptoms of cataract, surgical options, and treatments and risks were discussed with patient.   - discussed diagnosis and progression  - under the expert management of [facility]  7. Dry eyes OU - recommend artificial tears and lubricating ointment as needed  No follow-ups on file.   Ophthalmic Meds Ordered this visit:  No orders of the defined types were placed in this encounter.   This document serves as a record of services personally performed by Redell JUDITHANN Hans, MD, PhD. It was created on their behalf by Wanda GEANNIE Keens, COT an ophthalmic technician. The creation of this record is the provider's dictation and/or activities during the visit.    Electronically signed by:  Wanda GEANNIE Keens, COT  11/01/23 1:33 PM  This document serves as a record of services personally performed by Redell JUDITHANN Hans, MD, PhD. It was created on their behalf by Almetta Pesa, an ophthalmic technician. The creation of this record is the provider's dictation and/or activities during the visit.    Electronically signed by: Almetta Pesa, OA, 11/01/23  1:33 PM    Redell JUDITHANN Hans, M.D., Ph.D. Diseases & Surgery of the Retina and Vitreous [facility]   Abbreviations: M myopia (nearsighted); A astigmatism; H hyperopia (farsighted); P presbyopia; Mrx spectacle prescription;  CTL contact lenses; OD right eye; OS left  eye; OU both eyes  XT exotropia; ET esotropia; PEK punctate epithelial keratitis; PEE punctate epithelial erosions; DES dry eye syndrome; MGD meibomian gland dysfunction; ATs artificial tears; PFAT's preservative free artificial tears; NSC nuclear sclerotic cataract; PSC posterior subcapsular cataract; ERM epi-retinal membrane; PVD posterior vitreous detachment; RD retinal detachment; DM diabetes mellitus; DR diabetic retinopathy; NPDR non-proliferative diabetic  retinopathy; PDR proliferative diabetic retinopathy; CSME clinically significant macular edema; DME diabetic macular edema; dbh dot blot hemorrhages; CWS cotton wool spot; POAG primary open angle glaucoma; C/D cup-to-disc ratio; HVF humphrey visual field; GVF goldmann visual field; OCT optical coherence tomography; IOP intraocular pressure; BRVO Branch retinal vein occlusion; CRVO central retinal vein occlusion; CRAO central retinal artery occlusion; BRAO branch retinal artery occlusion; RT retinal tear; SB scleral buckle; PPV pars plana vitrectomy; VH Vitreous hemorrhage; PRP panretinal laser photocoagulation; IVK intravitreal kenalog ; VMT vitreomacular traction; MH Macular hole;  NVD neovascularization of the disc; NVE neovascularization elsewhere; AREDS age related eye disease study; ARMD age related macular degeneration; POAG primary open angle glaucoma; EBMD epithelial/anterior basement membrane dystrophy; ACIOL anterior chamber intraocular lens; IOL intraocular lens; PCIOL posterior chamber intraocular lens; Phaco/IOL phacoemulsification with intraocular lens placement; PRK photorefractive keratectomy; LASIK laser assisted in situ keratomileusis; HTN hypertension; DM diabetes mellitus; COPD chronic obstructive pulmonary disease

## 2023-11-01 ENCOUNTER — Encounter (INDEPENDENT_AMBULATORY_CARE_PROVIDER_SITE_OTHER): Payer: Self-pay | Admitting: Ophthalmology

## 2023-11-01 ENCOUNTER — Ambulatory Visit (INDEPENDENT_AMBULATORY_CARE_PROVIDER_SITE_OTHER): Admitting: Ophthalmology

## 2023-11-01 DIAGNOSIS — E113313 Type 2 diabetes mellitus with moderate nonproliferative diabetic retinopathy with macular edema, bilateral: Secondary | ICD-10-CM

## 2023-11-01 DIAGNOSIS — Z7984 Long term (current) use of oral hypoglycemic drugs: Secondary | ICD-10-CM

## 2023-11-01 DIAGNOSIS — H04123 Dry eye syndrome of bilateral lacrimal glands: Secondary | ICD-10-CM

## 2023-11-01 DIAGNOSIS — H35033 Hypertensive retinopathy, bilateral: Secondary | ICD-10-CM

## 2023-11-01 DIAGNOSIS — Z794 Long term (current) use of insulin: Secondary | ICD-10-CM

## 2023-11-01 DIAGNOSIS — I10 Essential (primary) hypertension: Secondary | ICD-10-CM

## 2023-11-01 DIAGNOSIS — H2513 Age-related nuclear cataract, bilateral: Secondary | ICD-10-CM

## 2023-11-02 NOTE — Progress Notes (Signed)
 [facility] - Clinic Note  11/03/2023     CHIEF COMPLAINT Patient presents for Retina Follow Up  HISTORY OF PRESENT ILLNESS: Ana Long is a 48 y.o. female who presents to the clinic today for:   HPI     Retina Follow Up   Patient presents with  Diabetic Retinopathy.  In left eye.  Duration of 2 days.  Since onset it is stable.  I, the attending physician,  performed the HPI with the patient and updated documentation appropriately.        Comments   Pt presents for left eye injection. Pt denies changes in vision/FOL/floaters/pain.      Last edited by Ana Rogue, MD on 11/03/2023  2:19 PM.      Referring physician: Octavia Charlie Hamilton MD [address] [place], [place] [address]  HISTORICAL INFORMATION:   Selected notes from the MEDICAL RECORD NUMBER Re-referred by Dr. Hamilton Ana for DM exam   CURRENT MEDICATIONS: Current Outpatient Medications (Ophthalmic Drugs)  Medication Sig   Olopatadine HCl 0.2 % SOLN Place one drop into both eyes daily.   No current facility-administered medications for this visit. (Ophthalmic Drugs)   Current Outpatient Medications (Other)  Medication Sig   acyclovir  (ZOVIRAX ) 400 MG tablet TAKE 1 TABLET BY MOUTH TWICE A DAY   albuterol  (PROAIR  HFA) 108 (90 Base) MCG/ACT inhaler Inhale 2 puffs into the lungs every 4 (four) hours as needed for wheezing or shortness of breath (cough). (Patient taking differently: Inhale 2 puffs into the lungs every 4 (four) hours as needed for wheezing or shortness of breath (or coughing).)   atorvastatin  (LIPITOR) 10 MG tablet Take 10 mg by mouth daily.   benzonatate  (TESSALON ) 200 MG capsule Take 1 capsule (200 mg total) by mouth 3 (three) times daily as needed for cough.   Blood Glucose Monitoring Suppl (ACCU-CHEK AVIVA PLUS) w/Device KIT Please check you blood sugars 4 times a day   CIPRODEX OTIC suspension Place into the right ear.   cyclobenzaprine  (FLEXERIL ) 10 MG tablet Take 1  tablet (10 mg total) by mouth 3 (three) times daily as needed for muscle spasms.   DERMA-SMOOTHE/FS SCALP 0.01 % OIL Apply topically.   diclofenac  (VOLTAREN ) 75 MG EC tablet Take 1 tablet (75 mg total) by mouth 2 (two) times daily.   fluconazole  (DIFLUCAN ) 150 MG tablet TAKE 1 TABLET BY MOUTH NOW. THEN REPEAT IN 1 WEEK   furosemide  (LASIX ) 20 MG tablet Take 20 mg by mouth daily as needed for fluid or edema.   glucose blood (ACCU-CHEK SMARTVIEW) test strip TEST FOUR TIMES DAILY   HUMULIN  R U-500 KWIKPEN 500 UNIT/ML kwikpen Inject into the skin.   hydrochlorothiazide (HYDRODIURIL) 12.5 MG tablet Take by mouth.   Insulin  Pen Needle (B-D UF III MINI PEN NEEDLES) 31G X 5 MM MISC USE AS DIRECTED TO INJECT INSULIN  FIVE A DAY E11.65   lactulose  (CEPHULAC ) 10 g packet Take 1 packet (10 g total) by mouth daily as needed. Reported on 07/29/2015 (Patient taking differently: Take 10 g by mouth daily as needed (for constipation).)   Lancets (ACCU-CHEK SOFT TOUCH) lancets Use as instructed   loratadine  (CLARITIN ) 10 MG tablet Take 1 tablet (10 mg total) by mouth 2 (two) times daily.   mometasone -formoterol  (DULERA ) 200-5 MCG/ACT AERO Inhale 2 puffs into the lungs 2 (two) times daily.   montelukast  (SINGULAIR ) 10 MG tablet Take 1 tablet (10 mg total) by mouth at bedtime.   nitroGLYCERIN  (NITRODUR -  DOSED IN MG/24 HR) 0.2 mg/hr patch Apply 1/4th patch to affected achilles, change daily   omeprazole  (PRILOSEC) 20 MG capsule Take 1 capsule (20 mg total) by mouth daily before breakfast.   predniSONE  (DELTASONE ) 10 MG tablet 6 tabs po day 1, 5 tabs po day 2, 4 tabs po day 3, 3 tabs po day 4, 2 tabs po day 5, 1 tab po day 6   SPIRIVA RESPIMAT 1.25 MCG/ACT AERS SMARTSIG:2 Puff(s) Via Inhaler Daily   sulfamethoxazole -trimethoprim  (BACTRIM  DS) 800-160 MG tablet Take 1 tablet by mouth 2 (two) times daily.   topiramate  (TOPAMAX ) 25 MG tablet Take by mouth.   traMADol  (ULTRAM ) 50 MG tablet Take 1 tablet (50 mg total) by mouth  every 6 (six) hours as needed.   Current Facility-Administered Medications (Other)  Medication Route   diphenhydrAMINE  (BENADRYL ) injection 12.5 mg Intramuscular   REVIEW OF SYSTEMS: ROS   Positive for: Endocrine, Eyes, Respiratory Negative for: Constitutional, Gastrointestinal, Neurological, Skin, Genitourinary, Musculoskeletal, HENT, Cardiovascular, Psychiatric, Allergic/Imm, Heme/Lymph Last edited by Ana Long, COT on 11/03/2023  9:17 AM.       ALLERGIES Allergies  Allergen Reactions   Bee Venom Anaphylaxis and Swelling   Wasp Venom Anaphylaxis and Swelling   Citrus Hives and Itching   Latex Hives and Swelling   Penicillins Hives and Itching    Has patient had a PCN reaction causing immediate rash, facial/tongue/throat swelling, SOB or lightheadedness with hypotension: Yes Has patient had a PCN reaction causing severe rash involving mucus membranes or skin necrosis: Unk Has patient had a PCN reaction that required hospitalization: Was already in hosp Has patient had a PCN reaction occurring within the last 10 years: No If all of the above answers are NO, then may proceed with Cephalosporin use.    Shellfish Allergy Other (See Comments) and Swelling    Patient is uncertain; stated that she tolerates this (??)   Soap Hives, Itching and Swelling    PUREX LAUNDRY DETERGENT   Tomato Hives and Itching   Banana Itching and Nausea Only   Chocolate Itching   Fluticasone  Other (See Comments)    Per patient, makes sneeze and gag   Hydrocodone  Itching and Nausea Only   Other Itching    Acidic foods- Itching [place]- Itching in the roof of the mouth   Adhesive [Tape] Itching and Rash    Allergic to  leads   Doxycycline Other (See Comments)    AFFECTS JOINTS   Liraglutide Nausea Only   Metformin  And Related Nausea Only, Rash, Other (See Comments) and Diarrhea    GI UPSET, also   Red Dye #40 (Allura Red) Itching, Rash and Other (See Comments)    At eye doctor's office,  the dilating drops- Skin breaks out, chest turned red, itching, face became swollen   PAST MEDICAL HISTORY 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   Cataract    NS OU   Complication of anesthesia    states is hard to wake up post-op   Constipation 12/30/2014   Diabetes mellitus    intolerant to Metformin    Diabetic retinopathy (HCC)    NPDR OU   Dry skin    hands    Family history of anesthesia  complication    mother went into coma during c-section   Genital herpes 01/01/2006   on acyclovir  BID    GERD (gastroesophageal reflux disease)    Herpes genital    Hyperglycemia 12/26/2017   Hypertensive retinopathy    OU   Low back pain 11/22/2012   Lower extremity edema 10/18/2012   Menorrhagia 11/06/2015   Morbid obesity with BMI of 50 to 60 07/23/2009   Obesity hypoventilation syndrome (HCC) 10/30/2012   Obesity, morbid (HCC) 01/10/2014   OSA on CPAP     AHI was on  11-17-12 to 120/hr, titrated to 15 cm with 3 cm EPR/    PCOS (polycystic ovarian syndrome)    Rheumatoid arthritis (HCC)    Right ankle pain 06/19/2015   Right hip pain 09/04/2017   Right shoulder pain 06/01/2017   Seasonal allergies    Sleep apnea, obstructive    uses CPAP nightly - had sleep study 08/22/2007   Past Surgical History:  Procedure Laterality Date   CARPAL TUNNEL RELEASE Right 10/02/2012   Procedure: RIGHT CARPAL TUNNEL RELEASE ENDOSCOPIC;  Surgeon: Alm DELENA Hummer, MD;  Location: [facility];  Service: Orthopedics;  Laterality: Right;   CHOLECYSTECTOMY N/A 12/02/2014   Procedure: LAPAROSCOPIC CHOLECYSTECTOMY;  Surgeon: Krystal Spinner, MD;  Location: [facility];  Service: General;  Laterality: N/A;   HYSTEROSCOPY WITH D & C  01-08-2002   FAMILY  HISTORY Family History  Problem Relation Age of Onset   Other Mother    Diabetes Maternal Aunt    Heart attack Maternal Aunt    Alzheimer's disease Maternal Uncle    Cancer Maternal Uncle        prostate   Diabetes Paternal Aunt    Cancer Paternal Aunt        brain,mouth   Alzheimer's disease Paternal Aunt    Colon polyps Paternal Uncle    Diabetes Paternal Uncle    Diabetes Maternal Grandmother    Diabetes Maternal Grandfather    Leukemia Cousin    Other Other    Obesity Other    Other Other    Colitis Neg Hx    SOCIAL HISTORY Social History   Tobacco Use   Smoking status: Former    Current packs/day: 0.00    Types: Cigarettes    Start date: 05/23/2000    Quit date: 05/24/2010    Years since quitting: 13.4   Smokeless tobacco: Never  Vaping Use   Vaping status: Never Used  Substance Use Topics   Alcohol use: Yes    Alcohol/week: 0.0 standard drinks of alcohol    Comment: socially   Drug use: No       OPHTHALMIC EXAM: Base Eye Exam     Visual Acuity (Snellen - Linear)       Right Left   Dist cc 20/25 -2 20/25 -2   Dist ph cc 20/20 -1 20/20 -1    Correction: Glasses         Tonometry (Tonopen, 9:12 AM)       Right Left   Pressure 16 18         Pupils       Pupils Dark Light Shape React APD   Right PERRL 4 3 Round Brisk None   Left PERRL 4 3 Round Brisk None         Visual Fields       Left Right    Full Full         Extraocular Movement  Right Left    Full, Ortho Full, Ortho         Neuro/Psych     Oriented x3: Yes   Mood/Affect: Normal         Dilation     Left eye: 1.0% Mydriacyl, 2.5% Phenylephrine @ 9:13 AM           Slit Lamp and Fundus Exam     Slit Lamp Exam       Right Left   Lids/Lashes Dermatochalasis - upper lid, mild Meibomian gland dysfunction Dermatochalasis - upper lid, mild Meibomian gland dysfunction   Conjunctiva/Sclera White and quiet White and quiet   Cornea 2+Punctate epithelial  erosions, mild tear film debris Trace Punctate epithelial erosions   Anterior Chamber deep and quiet deep and quiet   Iris round and dilated, No NVI round and dilated   Lens 1+ NS, 1+CC 1+NS, 1+CC   Anterior Vitreous mild vitreous syneresis; PVD; mild vit condensations mild vitreous syneresis, scattered vitreous condensations, silicone oil micro bubbles trapped within vit         Fundus Exam       Right Left   Disc Pink and sharp, +cupping Pink and sharp, +cupping   C/D Ratio 0.7 0.6   Macula good foveal reflex, focal exudates ST macula -- improved, scattered MA - improved, edema and punctate exudates temporal macula -- slightly improved, focal laser changes inferior macula good foveal reflex, scatttered MA's and exudates, central cystic changes, focal cluster of exudates SN macula -- improving   Vessels attenuated, Tortuous attenuated, Tortuous   Periphery Attached, scattered MA/exudates greatest inferiorly, faint CWS nasal midzone Attached, scattered MA and exudates greatest posteriorly, white without pressure temporally           Refraction     Wearing Rx       Sphere Cylinder Axis   Right -4.25 +0.75 021   Left -4.25 +1.00 042    Type: SVL           IMAGING AND PROCEDURES  Imaging and Procedures for @TODAY @  OCT, Retina - OU - Both Eyes       Right Eye Quality was good. Central Foveal Thickness: 221. Progression has been stable. Findings include normal foveal contour, no SRF, intraretinal hyper-reflective material, intraretinal fluid, vitreomacular adhesion (Persistent IRF/cystic changes temporal macula, scattered punctate IRHM ).   Left Eye Quality was good. Central Foveal Thickness: 230. Progression has been stable. Findings include normal foveal contour, no SRF, intraretinal hyper-reflective material, intraretinal fluid (Focal IRF / IRHM temporal fovea and mac).   Notes *Images captured and stored on drive  Diagnosis / Impression:  +DME OU OD: Persistent  IRF/cystic changes temporal macula, scattered punctate IRHM  OS: Focal IRF / IRHM temporal fovea and mac  Clinical management:  See below  Abbreviations: NFP - Normal foveal profile. CME - cystoid macular edema. PED - pigment epithelial detachment. IRF - intraretinal fluid. SRF - subretinal fluid. EZ - ellipsoid zone. ERM - epiretinal membrane. ORA - outer retinal atrophy. ORT - outer retinal tubulation. SRHM - subretinal hyper-reflective material      Intravitreal Injection, Pharmacologic Agent - OS - Left Eye       Time Out 11/03/2023. 9:43 AM. Confirmed correct patient, procedure, site, and patient consented.   Anesthesia Topical anesthesia was used. Anesthetic medications included Lidocaine  2%, Proparacaine 0.5%.   Procedure Preparation included 5% betadine to ocular surface, eyelid speculum. A supplied needle was used.   Injection: 6 mg faricimab -svoa 6 MG/0.05ML  Route: Intravitreal, Site: Left Eye   NDC: G645945, Lot: [id number], Expiration date: 06/21/2024, Waste: 0 mL   Post-op Post injection exam found visual acuity of at least counting fingers. The patient tolerated the procedure well. There were no complications. The patient received written and verbal post procedure care education. Post injection medications were not given.            ASSESSMENT/PLAN:    ICD-10-CM   1. Moderate nonproliferative diabetic retinopathy of both eyes with macular edema associated with type 2 diabetes mellitus (HCC)  E11.3313 OCT, Retina - OU - Both Eyes    Intravitreal Injection, Pharmacologic Agent - OS - Left Eye    faricimab -svoa (VABYSMO ) 6mg /0.6mL intravitreal injection    2. Current use of insulin  (HCC)  Z79.4     3. Long term (current) use of oral hypoglycemic drugs  Z79.84     4. Essential hypertension  I10     5. Hypertensive retinopathy of both eyes  H35.033     6. Nuclear sclerosis of both eyes  H25.13     7. Dry eyes  H04.123      1-3.  Moderate  non-proliferative diabetic retinopathy w/ DME, OU  - delayed f/u -- 11.5 wks instead of 4 (10.25.24 to 01.15.25)  - A1c 10.3 on 06.17.25  - pt lost to f/u from 10.23.2019 to 11.18.20  - FA in 2019 showed leaking MA OU; no NV OU **of note, pt had pruritic rxn to fluorescein  dye -- resolved with 12.5 mg IV benadryl **  - FA 12.16.2020 shows late leaking MA's, non-perfusion defects; no NV OU - s/p IVA OD #1 (12.16.20), #2 (01.25.21), #3 (05.26.21), #4 (07.02.21), #5 (10.15.21), #6 (11.23.21), #7 (07.27.22), #8 (09.09.22), #9 (11.11.22), #10 (01.20.23), #11 (02.23.23), #12 (04.09.23), #13 (05.23.23), #14 (06.30.23), #15 (08.04.23), #16 (09.01.23), #17 (10.06.23), #18 (11.10.23) #19 (12.15.23), #20 (01.19.24), #21 (02.22.24), #22 (04.09.24), #23 (06.28.24), #24 (07.29.24), #25 (09.27.24), #26 (10.25.24), #27 (04.02.25) -- IVA resistance - s/p IVA OS #1 (12.23.20), #2 (01.27.21), #3 (03.02.21), #4 (05.26.21), #5 (07.02.21), #6 (10.15.21), #7 (11.23.21), #8 (01.11.22), #9 (03.04.22), #10 (04.27.22), #11 (11.14.22), #12 (01.10.23), #13 (02.20.23), #14 (04.05.23), #15 (05.19.23), #16 (06.23.23), #17 (07.28.23), #18 (09.01.23), #19 (10.06.23), #20 (11.10.23) #21 (12.15.23), #22 (01.19.24), #23 (02.22.24), #24 (04.01.24), #25 (04.30.24), #26 (05.28.24), #27 (06.28.24) -- IVA Resistance - s/p IVE OD #1 (05.02.25) #2(05.30.25), #3 (06.27.25), #4 (07.30.25), #5 (09.09.25) ======================================  - s/p focal laser OD (03.19.21), (04.01.22) ====================================== - s/p IVE OS #1 (07.29.24), #2 (08.30.24), #3 (09.24.24), #4 (10.25.24), #5 (01.15.25) -- IVE resistance ====================================== - s/p IVV OS #1 (02.26.25), #2 (03.26.25), #3 (05.02.25), #4 (05.30.25), #5 (06.27.25), # 6 (08.08.25) ======================================  - exam shows scattered MA, IRH, exudates and mild macular edema OU  - BCVA OU 20/20 - OCT shows OD: Persistent IRF/cystic changes temporal  macula, scattered punctate IRHM; OS: Focal IRF / IRHM temporal fovea and mac - Recommend IVV OS #7 today, 09.11.25 w/ f/u in 4-5 wks  - pt wishes to proceed with injection - RBA of procedure discussed, questions answered - see procedure note  - Vabsymo informed consent form was signed and scanned on 02.26.25 (OU)  - Eylea  informed consent form was signed and scanned on 05.02.25 (OD)  - f/u 4-5 weeks - DFE/OCT, possible injxns  4,5. Hypertensive retinopathy OU  - discussed importance of tight BP control             - continue to monitor  6. Nuclear Sclerosis  - The symptoms of  cataract, surgical options, and treatments and risks were discussed with patient.   - discussed diagnosis and progression  - under the expert management of [facility]  7. Dry eyes OU - recommend artificial tears and lubricating ointment as needed  Return for 4-5 wks - NPDR OU, DFE, OCT, Possible Injxn.   Ophthalmic Meds Ordered this visit:  Meds ordered this encounter  Medications   faricimab -svoa (VABYSMO ) 6mg /0.38mL intravitreal injection     This document serves as a record of services personally performed by Redell JUDITHANN Hans, MD, PhD. It was created on their behalf by Auston Muzzy, COMT. The creation of this record is the provider's dictation and/or activities during the visit.  Electronically signed by: Auston Muzzy, COMT 11/03/23 2:22 PM  This document serves as a record of services personally performed by Redell JUDITHANN Hans, MD, PhD. It was created on their behalf by Almetta Pesa, an ophthalmic technician. The creation of this record is the provider's dictation and/or activities during the visit.    Electronically signed by: Almetta Pesa, OA, 11/03/23  2:22 PM   Redell JUDITHANN Hans, M.D., Ph.D. Diseases & Surgery of the Retina and Vitreous [facility]  I have reviewed the above documentation for accuracy and completeness, and I agree with the above. Redell JUDITHANN Hans,  M.D., Ph.D. 11/03/23 2:22 PM   Abbreviations: M myopia (nearsighted); A astigmatism; H hyperopia (farsighted); P presbyopia; Mrx spectacle prescription;  CTL contact lenses; OD right eye; OS left eye; OU both eyes  XT exotropia; ET esotropia; PEK punctate epithelial keratitis; PEE punctate epithelial erosions; DES dry eye syndrome; MGD meibomian gland dysfunction; ATs artificial tears; PFAT's preservative free artificial tears; NSC nuclear sclerotic cataract; PSC posterior subcapsular cataract; ERM epi-retinal membrane; PVD posterior vitreous detachment; RD retinal detachment; DM diabetes mellitus; DR diabetic retinopathy; NPDR non-proliferative diabetic retinopathy; PDR proliferative diabetic retinopathy; CSME clinically significant macular edema; DME diabetic macular edema; dbh dot blot hemorrhages; CWS cotton wool spot; POAG primary open angle glaucoma; C/D cup-to-disc ratio; HVF humphrey visual field; GVF goldmann visual field; OCT optical coherence tomography; IOP intraocular pressure; BRVO Branch retinal vein occlusion; CRVO central retinal vein occlusion; CRAO central retinal artery occlusion; BRAO branch retinal artery occlusion; RT retinal tear; SB scleral buckle; PPV pars plana vitrectomy; VH Vitreous hemorrhage; PRP panretinal laser photocoagulation; IVK intravitreal kenalog ; VMT vitreomacular traction; MH Macular hole;  NVD neovascularization of the disc; NVE neovascularization elsewhere; AREDS age related eye disease study; ARMD age related macular degeneration; POAG primary open angle glaucoma; EBMD epithelial/anterior basement membrane dystrophy; ACIOL anterior chamber intraocular lens; IOL intraocular lens; PCIOL posterior chamber intraocular lens; Phaco/IOL phacoemulsification with intraocular lens placement; PRK photorefractive keratectomy; LASIK laser assisted in situ keratomileusis; HTN hypertension; DM diabetes mellitus; COPD chronic obstructive pulmonary disease

## 2023-11-03 ENCOUNTER — Encounter (INDEPENDENT_AMBULATORY_CARE_PROVIDER_SITE_OTHER): Payer: Self-pay | Admitting: Ophthalmology

## 2023-11-03 ENCOUNTER — Ambulatory Visit (INDEPENDENT_AMBULATORY_CARE_PROVIDER_SITE_OTHER): Admitting: Ophthalmology

## 2023-11-03 DIAGNOSIS — H04123 Dry eye syndrome of bilateral lacrimal glands: Secondary | ICD-10-CM

## 2023-11-03 DIAGNOSIS — I10 Essential (primary) hypertension: Secondary | ICD-10-CM | POA: Diagnosis not present

## 2023-11-03 DIAGNOSIS — Z794 Long term (current) use of insulin: Secondary | ICD-10-CM

## 2023-11-03 DIAGNOSIS — E113313 Type 2 diabetes mellitus with moderate nonproliferative diabetic retinopathy with macular edema, bilateral: Secondary | ICD-10-CM | POA: Diagnosis not present

## 2023-11-03 DIAGNOSIS — H2513 Age-related nuclear cataract, bilateral: Secondary | ICD-10-CM

## 2023-11-03 DIAGNOSIS — Z7984 Long term (current) use of oral hypoglycemic drugs: Secondary | ICD-10-CM | POA: Diagnosis not present

## 2023-11-03 DIAGNOSIS — H35033 Hypertensive retinopathy, bilateral: Secondary | ICD-10-CM

## 2023-11-03 MED ORDER — FARICIMAB-SVOA 6 MG/0.05ML IZ SOSY
6.0000 mg | PREFILLED_SYRINGE | INTRAVITREAL | Status: AC | PRN
  Administered 2023-11-03: 6 mg via INTRAVITREAL

## 2023-11-03 MED ORDER — AFLIBERCEPT 2MG/0.05ML IZ SOLN FOR KALEIDOSCOPE
2.0000 mg | INTRAVITREAL | Status: AC | PRN
  Administered 2023-11-03: 2 mg via INTRAVITREAL

## 2023-11-14 NOTE — Discharge Instructions (Signed)

## 2023-11-15 ENCOUNTER — Inpatient Hospital Stay
Admission: RE | Admit: 2023-11-15 | Discharge: 2023-11-15 | Disposition: A | Discharge status: Home / Self Care | Source: Ambulatory Visit | Attending: Family Medicine | Admitting: Family Medicine

## 2023-11-28 NOTE — Progress Notes (Signed)
 [facility] - Clinic Note  12/02/2023     CHIEF COMPLAINT Patient presents for Retina Follow Up  HISTORY OF PRESENT ILLNESS: Ana Long is a 48 y.o. female who presents to the clinic today for:   HPI     Retina Follow Up   Patient presents with  Diabetic Retinopathy.  In both eyes.  This started 4 weeks ago.  Duration of 4 weeks.  Since onset it is stable.  I, the attending physician,  performed the HPI with the patient and updated documentation appropriately.        Comments   4 week retina follow up NPDR and IVV OS she wants to have both eyes injected today  pt is reporting no vision changes noticed she denies any flashes has some floaters her last reading was 3002 nights ago       Last edited by Valdemar Rogue, MD on 12/02/2023  5:44 PM.     Pt states she got new rx specs. She's still having issues seeing stuff up close. Seeing floaters in OD. Less in OS.   Referring physician: Octavia Charlie Hamilton MD [address] [place], [place] [address]  HISTORICAL INFORMATION:   Selected notes from the MEDICAL RECORD NUMBER Re-referred by Dr. Hamilton Octavia for DM exam   CURRENT MEDICATIONS: Current Outpatient Medications (Ophthalmic Drugs)  Medication Sig   Olopatadine HCl 0.2 % SOLN Place one drop into both eyes daily.   No current facility-administered medications for this visit. (Ophthalmic Drugs)   Current Outpatient Medications (Other)  Medication Sig   acyclovir  (ZOVIRAX ) 400 MG tablet TAKE 1 TABLET BY MOUTH TWICE A DAY   albuterol  (PROAIR  HFA) 108 (90 Base) MCG/ACT inhaler Inhale 2 puffs into the lungs every 4 (four) hours as needed for wheezing or shortness of breath (cough). (Patient taking differently: Inhale 2 puffs into the lungs every 4 (four) hours as needed for wheezing or shortness of breath (or coughing).)   atorvastatin  (LIPITOR) 10 MG tablet Take 10 mg by mouth daily.   benzonatate  (TESSALON ) 200 MG capsule Take 1 capsule (200 mg  total) by mouth 3 (three) times daily as needed for cough.   Blood Glucose Monitoring Suppl (ACCU-CHEK AVIVA PLUS) w/Device KIT Please check you blood sugars 4 times a day   CIPRODEX OTIC suspension Place into the right ear.   cyclobenzaprine  (FLEXERIL ) 10 MG tablet Take 1 tablet (10 mg total) by mouth 3 (three) times daily as needed for muscle spasms.   DERMA-SMOOTHE/FS SCALP 0.01 % OIL Apply topically.   diclofenac  (VOLTAREN ) 75 MG EC tablet Take 1 tablet (75 mg total) by mouth 2 (two) times daily.   fluconazole  (DIFLUCAN ) 150 MG tablet TAKE 1 TABLET BY MOUTH NOW. THEN REPEAT IN 1 WEEK   furosemide  (LASIX ) 20 MG tablet Take 20 mg by mouth daily as needed for fluid or edema.   glucose blood (ACCU-CHEK SMARTVIEW) test strip TEST FOUR TIMES DAILY   HUMULIN  R U-500 KWIKPEN 500 UNIT/ML kwikpen Inject into the skin.   hydrochlorothiazide (HYDRODIURIL) 12.5 MG tablet Take by mouth.   Insulin  Pen Needle (B-D UF III MINI PEN NEEDLES) 31G X 5 MM MISC USE AS DIRECTED TO INJECT INSULIN  FIVE A DAY E11.65   lactulose  (CEPHULAC ) 10 g packet Take 1 packet (10 g total) by mouth daily as needed. Reported on 07/29/2015 (Patient taking differently: Take 10 g by mouth daily as needed (for constipation).)   Lancets (ACCU-CHEK SOFT TOUCH) lancets Use as instructed  loratadine  (CLARITIN ) 10 MG tablet Take 1 tablet (10 mg total) by mouth 2 (two) times daily.   mometasone -formoterol  (DULERA ) 200-5 MCG/ACT AERO Inhale 2 puffs into the lungs 2 (two) times daily.   montelukast  (SINGULAIR ) 10 MG tablet Take 1 tablet (10 mg total) by mouth at bedtime.   nitroGLYCERIN  (NITRODUR - DOSED IN MG/24 HR) 0.2 mg/hr patch Apply 1/4th patch to affected achilles, change daily   omeprazole  (PRILOSEC) 20 MG capsule Take 1 capsule (20 mg total) by mouth daily before breakfast.   predniSONE  (DELTASONE ) 10 MG tablet 6 tabs po day 1, 5 tabs po day 2, 4 tabs po day 3, 3 tabs po day 4, 2 tabs po day 5, 1 tab po day 6   SPIRIVA RESPIMAT 1.25  MCG/ACT AERS SMARTSIG:2 Puff(s) Via Inhaler Daily   sulfamethoxazole -trimethoprim  (BACTRIM  DS) 800-160 MG tablet Take 1 tablet by mouth 2 (two) times daily.   topiramate  (TOPAMAX ) 25 MG tablet Take by mouth.   traMADol  (ULTRAM ) 50 MG tablet Take 1 tablet (50 mg total) by mouth every 6 (six) hours as needed.   Current Facility-Administered Medications (Other)  Medication Route   diphenhydrAMINE  (BENADRYL ) injection 12.5 mg Intramuscular   REVIEW OF SYSTEMS: ROS   Positive for: Endocrine, Eyes, Respiratory Negative for: Constitutional, Gastrointestinal, Neurological, Skin, Genitourinary, Musculoskeletal, HENT, Cardiovascular, Psychiatric, Allergic/Imm, Heme/Lymph Last edited by Resa Delon ORN, COT on 12/02/2023  1:35 PM.        ALLERGIES Allergies  Allergen Reactions   Bee Venom Anaphylaxis and Swelling   Wasp Venom Anaphylaxis and Swelling   Citrus Hives and Itching   Latex Hives and Swelling   Penicillins Hives and Itching    Has patient had a PCN reaction causing immediate rash, facial/tongue/throat swelling, SOB or lightheadedness with hypotension: Yes Has patient had a PCN reaction causing severe rash involving mucus membranes or skin necrosis: Unk Has patient had a PCN reaction that required hospitalization: Was already in hosp Has patient had a PCN reaction occurring within the last 10 years: No If all of the above answers are NO, then may proceed with Cephalosporin use.    Shellfish Allergy Other (See Comments) and Swelling    Patient is uncertain; stated that she tolerates this (??)   Soap Hives, Itching and Swelling    PUREX LAUNDRY DETERGENT   Tomato Hives and Itching   Banana Itching and Nausea Only   Chocolate Itching   Fluticasone  Other (See Comments)    Per patient, makes sneeze and gag   Hydrocodone  Itching and Nausea Only   Other Itching    Acidic foods- Itching [place]- Itching in the roof of the mouth   Adhesive [Tape] Itching and Rash     Allergic to  leads   Doxycycline Other (See Comments)    AFFECTS JOINTS   Liraglutide Nausea Only   Metformin  And Related Nausea Only, Rash, Other (See Comments) and Diarrhea    GI UPSET, also   Red Dye #40 (Allura Red) Itching, Rash and Other (See Comments)    At eye doctor's office, the dilating drops- Skin breaks out, chest turned red, itching, face became swollen   PAST MEDICAL HISTORY Past Medical History:  Diagnosis Date   Acute nonintractable headache 12/26/2017   Arthritis    major joints   Asthma    prn inhaler   Asthma exacerbation 07/25/2013   Asthma in adult, severe persistent, with acute exacerbation (HCC) 01/11/2018   Carpal tunnel syndrome of right wrist 09/2012   Cataract  NS OU   Complication of anesthesia    states is hard to wake up post-op   Constipation 12/30/2014   Diabetes mellitus    intolerant to Metformin    Diabetic retinopathy (HCC)    NPDR OU   Dry skin    hands    Family history of anesthesia complication    mother went into coma during c-section   Genital herpes 01/01/2006   on acyclovir  BID    GERD (gastroesophageal reflux disease)    Herpes genital    Hyperglycemia 12/26/2017   Hypertensive retinopathy    OU   Low back pain 11/22/2012   Lower extremity edema 10/18/2012   Menorrhagia 11/06/2015   Morbid obesity with BMI of 50 to 60 07/23/2009   Obesity hypoventilation syndrome (HCC) 10/30/2012   Obesity, morbid (HCC) 01/10/2014   OSA on CPAP     AHI was on  11-17-12 to 120/hr, titrated to 15 cm with 3 cm EPR/    PCOS (polycystic ovarian syndrome)    Rheumatoid arthritis (HCC)    Right ankle pain 06/19/2015   Right hip pain 09/04/2017   Right shoulder pain 06/01/2017   Seasonal allergies    Sleep apnea, obstructive    uses CPAP nightly - had sleep study 08/22/2007   Past Surgical History:  Procedure Laterality Date   CARPAL TUNNEL RELEASE Right 10/02/2012   Procedure: RIGHT CARPAL TUNNEL RELEASE ENDOSCOPIC;  Surgeon: Alm DELENA Hummer, MD;   Location: [facility];  Service: Orthopedics;  Laterality: Right;   CHOLECYSTECTOMY N/A 12/02/2014   Procedure: LAPAROSCOPIC CHOLECYSTECTOMY;  Surgeon: Krystal Spinner, MD;  Location: [facility];  Service: General;  Laterality: N/A;   HYSTEROSCOPY WITH D & C  01-08-2002   FAMILY HISTORY Family History  Problem Relation Age of Onset   Other Mother    Diabetes Maternal Aunt    Heart attack Maternal Aunt    Alzheimer's disease Maternal Uncle    Cancer Maternal Uncle        prostate   Diabetes Paternal Aunt    Cancer Paternal Aunt        brain,mouth   Alzheimer's disease Paternal Aunt    Colon polyps Paternal Uncle    Diabetes Paternal Uncle    Diabetes Maternal Grandmother    Diabetes Maternal Grandfather    Leukemia Cousin    Other Other    Obesity Other    Other Other    Colitis Neg Hx    SOCIAL HISTORY Social History   Tobacco Use   Smoking status: Former    Current packs/day: 0.00    Types: Cigarettes    Start date: 05/23/2000    Quit date: 05/24/2010    Years since quitting: 13.5   Smokeless tobacco: Never  Vaping Use   Vaping status: Never Used  Substance Use Topics   Alcohol use: Yes    Alcohol/week: 0.0 standard drinks of alcohol    Comment: socially   Drug use: No       OPHTHALMIC EXAM: Base Eye Exam     Visual Acuity (Snellen - Linear)       Right Left   Dist cc 20/20 20/25 -3   Dist ph cc  NI         Tonometry (Tonopen, 1:42 PM)       Right Left   Pressure 14 16         Pupils       Pupils Dark Light Shape React APD   Right  PERRL 4 3 Round Brisk None   Left PERRL 4 3 Round Brisk None         Visual Fields       Left Right    Full Full         Extraocular Movement       Right Left    Full, Ortho Full, Ortho         Neuro/Psych     Oriented x3: Yes   Mood/Affect: Normal         Dilation     Both eyes: 2.5% Phenylephrine @ 1:43 PM           Slit Lamp and Fundus Exam     Slit Lamp Exam       Right  Left   Lids/Lashes Dermatochalasis - upper lid, mild Meibomian gland dysfunction Dermatochalasis - upper lid, mild Meibomian gland dysfunction   Conjunctiva/Sclera White and quiet White and quiet   Cornea 2+Punctate epithelial erosions, mild tear film debris Trace Punctate epithelial erosions   Anterior Chamber deep and quiet deep and quiet   Iris round and dilated, No NVI round and dilated   Lens 1+ NS, 1+CC 1+NS, 1+CC   Anterior Vitreous mild vitreous syneresis; PVD; mild vit condensations mild vitreous syneresis, scattered vitreous condensations, silicone oil micro bubbles trapped within vit         Fundus Exam       Right Left   Disc Pink and sharp, +cupping Pink and sharp, +cupping   C/D Ratio 0.7 0.6   Macula good foveal reflex, focal exudates ST macula -- improved, scattered MA - improved, edema and punctate exudates temporal macula -- slightly improved, focal laser changes inferior macula good foveal reflex, scatttered MA's and exudates, central cystic changes, focal cluster of exudates SN macula -- improving   Vessels attenuated, Tortuous attenuated, Tortuous   Periphery Attached, scattered MA/exudates greatest inferiorly, faint CWS nasal midzone Attached, scattered MA and exudates greatest posteriorly, white without pressure temporally           Refraction     Wearing Rx       Sphere Cylinder Axis   Right -4.25 +0.75 021   Left -4.25 +1.00 042    Type: SVL           IMAGING AND PROCEDURES  Imaging and Procedures for @TODAY @  OCT, Retina - OU - Both Eyes       Right Eye Quality was good. Central Foveal Thickness: 220. Progression has worsened. Findings include normal foveal contour, no SRF, intraretinal hyper-reflective material, intraretinal fluid, vitreomacular adhesion (Persistent IRF/cystic changes temporal macula--slightly increased, scattered punctate IRHM ).   Left Eye Quality was good. Central Foveal Thickness: 233. Progression has been stable.  Findings include normal foveal contour, no SRF, intraretinal hyper-reflective material, intraretinal fluid (Focal IRF / IRHM temporal mac, scattered IRHM).   Notes *Images captured and stored on drive  Diagnosis / Impression:  +DME OU OD: Persistent IRF/cystic changes temporal macula--slighty increased, scattered punctate IRHM  OS: Focal IRF / IRHM temporal mac, scattered IRHM  Clinical management:  See below  Abbreviations: NFP - Normal foveal profile. CME - cystoid macular edema. PED - pigment epithelial detachment. IRF - intraretinal fluid. SRF - subretinal fluid. EZ - ellipsoid zone. ERM - epiretinal membrane. ORA - outer retinal atrophy. ORT - outer retinal tubulation. SRHM - subretinal hyper-reflective material      Intravitreal Injection, Pharmacologic Agent - OD - Right Eye  Time Out 12/02/2023. 2:21 PM. Confirmed correct patient, procedure, site, and patient consented.   Anesthesia Topical anesthesia was used. Anesthetic medications included Lidocaine  2%, Proparacaine 0.5%.   Procedure Preparation included 5% betadine to ocular surface, eyelid speculum. A (32g) needle was used.   Injection: 2 mg aflibercept  2 MG/0.05ML   Route: Intravitreal, Site: Right Eye   NDC: Q956576, Lot: [id number], Expiration date: 01/21/2025, Waste: 0 mL   Post-op Post injection exam found visual acuity of at least counting fingers. The patient tolerated the procedure well. There were no complications. The patient received written and verbal post procedure care education. Post injection medications were not given.      Intravitreal Injection, Pharmacologic Agent - OS - Left Eye       Time Out 12/02/2023. 2:22 PM. Confirmed correct patient, procedure, site, and patient consented.   Anesthesia Topical anesthesia was used. Anesthetic medications included Lidocaine  2%, Proparacaine 0.5%.   Procedure Preparation included 5% betadine to ocular surface, eyelid speculum. A  supplied needle was used.   Injection: 6 mg faricimab -svoa 6 MG/0.05ML   Route: Intravitreal, Site: Left Eye   NDC: 49757-903-93, Lot: [id number], Expiration date: 02/21/2025, Waste: 0 mL   Post-op Post injection exam found visual acuity of at least counting fingers. The patient tolerated the procedure well. There were no complications. The patient received written and verbal post procedure care education. Post injection medications were not given.            ASSESSMENT/PLAN:    ICD-10-CM   1. Moderate nonproliferative diabetic retinopathy of both eyes with macular edema associated with type 2 diabetes mellitus (HCC)  E11.3313 OCT, Retina - OU - Both Eyes    Intravitreal Injection, Pharmacologic Agent - OD - Right Eye    Intravitreal Injection, Pharmacologic Agent - OS - Left Eye    faricimab -svoa (VABYSMO ) 6mg /0.42mL intravitreal injection    aflibercept  (EYLEA ) SOLN 2 mg    2. Current use of insulin  (HCC)  Z79.4     3. Long term (current) use of oral hypoglycemic drugs  Z79.84     4. Essential hypertension  I10     5. Hypertensive retinopathy of both eyes  H35.033     6. Nuclear sclerosis of both eyes  H25.13     7. Dry eyes  H04.123      1-3.  Moderate non-proliferative diabetic retinopathy w/ DME, OU  - delayed f/u -- 11.5 wks instead of 4 (10.25.24 to 01.15.25)  - A1c 10.3 on 06.17.25  - pt lost to f/u from 10.23.2019 to 11.18.20  - FA in 2019 showed leaking MA OU; no NV OU **of note, pt had pruritic rxn to fluorescein  dye -- resolved with 12.5 mg IV benadryl **  - FA 12.16.2020 shows late leaking MA's, non-perfusion defects; no NV OU - s/p IVA OD #1 (12.16.20), #2 (01.25.21), #3 (05.26.21), #4 (07.02.21), #5 (10.15.21), #6 (11.23.21), #7 (07.27.22), #8 (09.09.22), #9 (11.11.22), #10 (01.20.23), #11 (02.23.23), #12 (04.09.23), #13 (05.23.23), #14 (06.30.23), #15 (08.04.23), #16 (09.01.23), #17 (10.06.23), #18 (11.10.23) #19 (12.15.23), #20 (01.19.24), #21 (02.22.24), #22  (04.09.24), #23 (06.28.24), #24 (07.29.24), #25 (09.27.24), #26 (10.25.24), #27 (04.02.25) -- IVA resistance - s/p IVA OS #1 (12.23.20), #2 (01.27.21), #3 (03.02.21), #4 (05.26.21), #5 (07.02.21), #6 (10.15.21), #7 (11.23.21), #8 (01.11.22), #9 (03.04.22), #10 (04.27.22), #11 (11.14.22), #12 (01.10.23), #13 (02.20.23), #14 (04.05.23), #15 (05.19.23), #16 (06.23.23), #17 (07.28.23), #18 (09.01.23), #19 (10.06.23), #20 (11.10.23) #21 (12.15.23), #22 (01.19.24), #23 (02.22.24), #24 (04.01.24), #25 (04.30.24), #26 (05.28.24), #27 (06.28.24) --  IVA Resistance - s/p IVE OD #1 (05.02.25) #2(05.30.25), #3 (06.27.25), #4 (07.30.25), #5 (09.09.25) ======================================  - s/p focal laser OD (03.19.21), (04.01.22) ====================================== - s/p IVE OS #1 (07.29.24), #2 (08.30.24), #3 (09.24.24), #4 (10.25.24), #5 (01.15.25) -- IVE resistance ====================================== - s/p IVV OS #1 (02.26.25), #2 (03.26.25), #3 (05.02.25), #4 (05.30.25), #5 (06.27.25), # 6 (08.08.25), #7 (09.11.25) ======================================  - exam shows scattered MA, IRH, exudates and mild macular edema OU  - BCVA OD 20/20, OS: 20/25-decreased - OCT shows OD: Persistent IRF/cystic changes temporal macula--slightly increased, scattered punctate IRHM; OS: Focal IRF / IRHM temporal mac, scattered IRHM at 4 weeks - Recommend IVE OD #6 and IVV OS #8 today, 10.10.25 w/ f/u in 4-5 wks  - pt wishes to proceed with injection - RBA of procedure discussed, questions answered - see procedure note  - Vabsymo informed consent form was signed and scanned on 02.26.25 (OU)  - Eylea  informed consent form was signed and scanned on 05.02.25 (OD)  - f/u 4-5 weeks - DFE/OCT, possible injxns  4,5. Hypertensive retinopathy OU  - discussed importance of tight BP control             - continue to monitor  6. Nuclear Sclerosis  - The symptoms of cataract, surgical options, and treatments and risks were  discussed with patient.   - discussed diagnosis and progression  - under the expert management of [facility]  7. Dry eyes OU - recommend artificial tears and lubricating ointment as needed  Return for 4-5 weeks NPDR OU, DFE, OCT, Possible Injxn.   Ophthalmic Meds Ordered this visit:  Meds ordered this encounter  Medications   faricimab -svoa (VABYSMO ) 6mg /0.95mL intravitreal injection   aflibercept  (EYLEA ) SOLN 2 mg     This document serves as a record of services personally performed by Redell JUDITHANN Hans, MD, PhD. It was created on their behalf by Delon Newness COT, an ophthalmic technician. The creation of this record is the provider's dictation and/or activities during the visit.    Electronically signed by: Delon Newness COT 10.06.25  11:18 PM  This document serves as a record of services personally performed by Redell JUDITHANN Hans, MD, PhD. It was created on their behalf by Almetta Pesa, an ophthalmic technician. The creation of this record is the provider's dictation and/or activities during the visit.    Electronically signed by: Almetta Pesa, OA, 12/08/23  11:18 PM  Redell JUDITHANN Hans, M.D., Ph.D. Diseases & Surgery of the Retina and Vitreous [facility] 12/02/2023   I have reviewed the above documentation for accuracy and completeness, and I agree with the above. Redell JUDITHANN Hans, M.D., Ph.D. 12/08/23 11:38 PM   Abbreviations: M myopia (nearsighted); A astigmatism; H hyperopia (farsighted); P presbyopia; Mrx spectacle prescription;  CTL contact lenses; OD right eye; OS left eye; OU both eyes  XT exotropia; ET esotropia; PEK punctate epithelial keratitis; PEE punctate epithelial erosions; DES dry eye syndrome; MGD meibomian gland dysfunction; ATs artificial tears; PFAT's preservative free artificial tears; NSC nuclear sclerotic cataract; PSC posterior subcapsular cataract; ERM epi-retinal membrane; PVD posterior vitreous detachment; RD  retinal detachment; DM diabetes mellitus; DR diabetic retinopathy; NPDR non-proliferative diabetic retinopathy; PDR proliferative diabetic retinopathy; CSME clinically significant macular edema; DME diabetic macular edema; dbh dot blot hemorrhages; CWS cotton wool spot; POAG primary open angle glaucoma; C/D cup-to-disc ratio; HVF humphrey visual field; GVF goldmann visual field; OCT optical coherence tomography; IOP intraocular pressure; BRVO Branch retinal vein occlusion; CRVO central retinal  vein occlusion; CRAO central retinal artery occlusion; BRAO branch retinal artery occlusion; RT retinal tear; SB scleral buckle; PPV pars plana vitrectomy; VH Vitreous hemorrhage; PRP panretinal laser photocoagulation; IVK intravitreal kenalog ; VMT vitreomacular traction; MH Macular hole;  NVD neovascularization of the disc; NVE neovascularization elsewhere; AREDS age related eye disease study; ARMD age related macular degeneration; POAG primary open angle glaucoma; EBMD epithelial/anterior basement membrane dystrophy; ACIOL anterior chamber intraocular lens; IOL intraocular lens; PCIOL posterior chamber intraocular lens; Phaco/IOL phacoemulsification with intraocular lens placement; PRK photorefractive keratectomy; LASIK laser assisted in situ keratomileusis; HTN hypertension; DM diabetes mellitus; COPD chronic obstructive pulmonary disease

## 2023-11-28 NOTE — Discharge Instructions (Signed)

## 2023-11-29 ENCOUNTER — Other Ambulatory Visit: Payer: Self-pay

## 2023-11-29 ENCOUNTER — Inpatient Hospital Stay
Admission: RE | Admit: 2023-11-29 | Discharge: 2023-11-29 | Disposition: A | Discharge status: Home / Self Care | Source: Ambulatory Visit | Attending: Family Medicine | Admitting: Family Medicine

## 2023-11-29 DIAGNOSIS — M47819 Spondylosis without myelopathy or radiculopathy, site unspecified: Secondary | ICD-10-CM

## 2023-11-29 NOTE — Progress Notes (Signed)
 Insurance denied auth for lumbar facet injections. Referring pt to Dr. Eldonna for evaluation and treatment.

## 2023-11-30 ENCOUNTER — Telehealth: Payer: Self-pay

## 2023-11-30 NOTE — Telephone Encounter (Signed)
 Called pt back and reviewed the plan as discussed yesterday.  -Referral to Dr. Eldonna for eval and possible lumbar facet injections. -if she does not hear from them within a week she will let us  know.

## 2023-11-30 NOTE — Telephone Encounter (Signed)
 Patient called office to get a call back from [facility] due to her calling her yesterday with information, patient doesn't remember all of the information that Megan gave patient, please give patient a call at your earliest convenience, thanks.

## 2023-12-02 ENCOUNTER — Encounter (INDEPENDENT_AMBULATORY_CARE_PROVIDER_SITE_OTHER): Payer: Self-pay | Admitting: Ophthalmology

## 2023-12-02 ENCOUNTER — Ambulatory Visit (INDEPENDENT_AMBULATORY_CARE_PROVIDER_SITE_OTHER): Admitting: Ophthalmology

## 2023-12-02 DIAGNOSIS — Z7984 Long term (current) use of oral hypoglycemic drugs: Secondary | ICD-10-CM | POA: Diagnosis not present

## 2023-12-02 DIAGNOSIS — H35033 Hypertensive retinopathy, bilateral: Secondary | ICD-10-CM

## 2023-12-02 DIAGNOSIS — E113313 Type 2 diabetes mellitus with moderate nonproliferative diabetic retinopathy with macular edema, bilateral: Secondary | ICD-10-CM | POA: Diagnosis not present

## 2023-12-02 DIAGNOSIS — I10 Essential (primary) hypertension: Secondary | ICD-10-CM | POA: Diagnosis not present

## 2023-12-02 DIAGNOSIS — H2513 Age-related nuclear cataract, bilateral: Secondary | ICD-10-CM

## 2023-12-02 DIAGNOSIS — Z794 Long term (current) use of insulin: Secondary | ICD-10-CM

## 2023-12-02 DIAGNOSIS — H04123 Dry eye syndrome of bilateral lacrimal glands: Secondary | ICD-10-CM

## 2023-12-06 MED ORDER — AFLIBERCEPT 2MG/0.05ML IZ SOLN FOR KALEIDOSCOPE
2.0000 mg | INTRAVITREAL | Status: AC | PRN
  Administered 2023-12-06: 2 mg via INTRAVITREAL

## 2023-12-06 MED ORDER — FARICIMAB-SVOA 6 MG/0.05ML IZ SOSY
6.0000 mg | PREFILLED_SYRINGE | INTRAVITREAL | Status: AC | PRN
  Administered 2023-12-06: 6 mg via INTRAVITREAL

## 2023-12-21 ENCOUNTER — Ambulatory Visit (INDEPENDENT_AMBULATORY_CARE_PROVIDER_SITE_OTHER): Admitting: Physical Medicine and Rehabilitation

## 2023-12-21 ENCOUNTER — Encounter: Payer: Self-pay | Admitting: Physical Medicine and Rehabilitation

## 2023-12-21 DIAGNOSIS — M545 Low back pain, unspecified: Secondary | ICD-10-CM | POA: Diagnosis not present

## 2023-12-21 DIAGNOSIS — M47817 Spondylosis without myelopathy or radiculopathy, lumbosacral region: Secondary | ICD-10-CM

## 2023-12-21 DIAGNOSIS — G8929 Other chronic pain: Secondary | ICD-10-CM

## 2023-12-21 DIAGNOSIS — M47816 Spondylosis without myelopathy or radiculopathy, lumbar region: Secondary | ICD-10-CM

## 2023-12-21 MED ORDER — DIAZEPAM 5 MG PO TABS: ORAL_TABLET | ORAL | 0 refills | Status: AC

## 2023-12-21 NOTE — Progress Notes (Unsigned)
 Ana Long - 48 y.o. female MRN [id number]  Date of birth: 1976-02-17  Office Visit Note: Visit Date: 12/21/2023 PCP: Rena Luke POUR, MD Referred by: Rena Luke POUR, MD  Subjective: Chief Complaint  Patient presents with   Lower Back - Pain   HPI: Ana Long is a 48 y.o. female who comes in today per the request of Dr. Ludie Littler for evaluation of chronic, worsening and severe right sided lower back pain, intermittent pain radiating down posterior leg. Pain ongoing for several years, worsens with movement and activity. Her pain becomes severe with household tasks such as cooking and cleaning. Prolonged walking also causes severe pain. She describes pain as tearing and separation pain, currently rates as 8 out of 10. Some relief of pain with home exercise regimen, rest and use of medications. History of forma physical therapy with short term relief of pain. Recent CT of lumbar spine shows multilevel facet arthropathy, most advanced at L4-L5. Dr. Littler ordered right L4-L5 facet joint injections to be done at [facility] in August of this year, patient did not undergo these injections. Patient denies focal weakness, numbness and tingling. No recent trauma or falls.      Review of Systems  Musculoskeletal:  Positive for back pain.  Neurological:  Negative for tingling, sensory change, focal weakness and weakness.  All other systems reviewed and are negative.  Otherwise per HPI.  Assessment & Plan: Visit Diagnoses:    ICD-10-CM   1. Chronic right-sided low back pain without sciatica  M54.50 Ambulatory referral to Physical Medicine Rehab   G89.29     2. Spondylosis without myelopathy or radiculopathy, lumbosacral region  M47.817 Ambulatory referral to Physical Medicine Rehab    3. Facet arthropathy, lumbar  M47.816 Ambulatory referral to Physical Medicine Rehab       Plan: Findings:  Chronic, worsening and severe right sided lower back pain, intermittent  pain radiating down posterior leg. Right sided lower back pain is biggest pain generator. Patient continues to have severe pain despite good conservative therapies such as formal physical therapy, home exercise regimen, rest and use of medications. Patients clinical presentation and exam are consistent with facet mediated pain. Intermittent pain radiating down right leg is more of a facet joint syndrome. She has pain with lumbar extension today. There is facet arthropathy noted at L4-L5. We discussed treatment plan in detail today. Next step is to perform diagnostic and hopefully therapeutic right L4-L5 facet joint injections under fluoroscopic guidance. If good relief of pain with facet joint injections we can repeat this procedure infrequently as needed. We would consider candidacy for radiofrequency ablation, however we are concerned this procedure would be difficult to perform due to body habitus. Patient denies focal weakness, numbness and tingling. No recent trauma of falls.     Meds & Orders:  Meds ordered this encounter  Medications   diazepam (VALIUM) 5 MG tablet    Sig: Take one tablet by mouth with light food one hour prior to procedure.    Dispense:  1 tablet    Refill:  0    Orders Placed This Encounter  Procedures   Ambulatory referral to Physical Medicine Rehab    Follow-up: Return for Right L4-L5 facet joint injection.   Procedures: No procedures performed      Clinical History: Narrative & Impression CLINICAL DATA:  Low back pain. Evaluate degree of osteoarthritis present. Chronic bilateral back pain with right-sided sciatica. No previous relevant surgery.   EXAM: CT  LUMBAR SPINE WITHOUT CONTRAST   TECHNIQUE: Multidetector CT imaging of the lumbar spine was performed without intravenous contrast administration. Multiplanar CT image reconstructions were also generated.   RADIATION DOSE REDUCTION: This exam was performed according to the departmental  dose-optimization program which includes automated exposure control, adjustment of the mA and/or kV according to patient size and/or use of iterative reconstruction technique.   COMPARISON:  Lumbar spine radiographs 06/30/2023. Lumbar MRI 03/13/2008.   FINDINGS: Segmentation: There are 5 lumbar type vertebral bodies.   Alignment: Normal.   Vertebrae: No evidence of acute fracture, traumatic subluxation or pars defect. Multilevel facet arthropathy, most advanced at L4-5 where there is associated vacuum phenomena bilaterally. Mild sacroiliac degenerative changes bilaterally.   Paraspinal and other soft tissues: No significant paraspinal findings.   Disc levels: Vacuum disc phenomenon with mild disc bulging and loss of disc height at T11-12. No significant disc space findings from T12-L1 through L2-3. At L3-4, there is preserved disc height with mild disc bulging and mild facet hypertrophy. No resulting spinal stenosis or foraminal narrowing. At L4-5, there is preserved disc height with mild disc bulging and advanced bilateral facet hypertrophy. No significant spinal stenosis or foraminal narrowing demonstrated. At L5-S1, there is preserved disc height with mild disc bulging and mild bilateral facet hypertrophy.   IMPRESSION: 1. Mild lumbar disc degeneration with preserved disc height and disc bulging as described. Disc desiccation is most advanced at T11-12. 2. Multilevel facet arthropathy, most advanced at L4-5. 3. No significant spinal stenosis or foraminal narrowing demonstrated. 4. No acute osseous findings.     Electronically Signed   By: Elsie Perone M.D.   On: 07/26/2023 15:18   She reports that she quit smoking about 13 years ago. Her smoking use included cigarettes. She started smoking about 23 years ago. She has never used smokeless tobacco. No results for input(s): HGBA1C, LABURIC in the last 8760 hours.  Objective:  VS:  HT:    WT:   BMI:     BP:    HR: bpm  TEMP: ( )  RESP:  Physical Exam Vitals and nursing note reviewed.  HENT:     Head: Normocephalic and atraumatic.     Right Ear: External ear normal.     Left Ear: External ear normal.     Nose: Nose normal.     Mouth/Throat:     Mouth: Mucous membranes are moist.  Eyes:     Extraocular Movements: Extraocular movements intact.  Cardiovascular:     Rate and Rhythm: Normal rate.     Pulses: Normal pulses.  Pulmonary:     Effort: Pulmonary effort is normal.  Abdominal:     General: Abdomen is flat. There is no distension.  Musculoskeletal:        General: Tenderness present.     Cervical back: Normal range of motion.     Comments: Patient rises from seated position to standing without difficulty. Pain noted with facet loading and lumbar extension. 5/5 strength noted with bilateral hip flexion, knee flexion/extension, ankle dorsiflexion/plantarflexion and EHL. No clonus noted bilaterally. No pain upon palpation of greater trochanters. No pain with internal/external rotation of bilateral hips. Sensation intact bilaterally. Negative slump test bilaterally. Ambulates without aid, gait steady.     Skin:    General: Skin is warm and dry.     Capillary Refill: Capillary refill takes less than 2 seconds.  Neurological:     General: No focal deficit present.     Mental Status: She  is alert and oriented to person, place, and time.  Psychiatric:        Mood and Affect: Mood normal.        Behavior: Behavior normal.     Ortho Exam  Imaging: No results found.  Past Medical/Family/Surgical/Social History: Medications & Allergies reviewed per EMR, new medications updated. Patient Active Problem List   Diagnosis Date Noted   Poor compliance with CPAP treatment 02/06/2020   Uncontrolled nonfamilial obstructive sleep apnea 02/06/2020   CPAP ventilation treatment not tolerated 02/06/2020   COVID-19 virus infection 08/29/2018   Pneumonia due to COVID-19 virus 08/28/2018   Acute  and chr resp failure, unsp w hypoxia or hypercapnia (HCC) 04/04/2018   Mild episode of recurrent major depressive disorder 10/13/2017   Achilles tendinitis of both lower extremities 07/24/2017   Cervical hypertrophic elongation 01/21/2016   Second degree uterine prolapse 02/12/2015   Dyspareunia in female 01/24/2015   Urinary retention 12/09/2014   High risk HPV infection 03/07/2013   Seasonal allergies 01/17/2013   OSA on CPAP    Obesity hypoventilation syndrome (HCC) 10/30/2012   DUB (dysfunctional uterine bleeding) 03/01/2012   Hypersomnia with sleep apnea    Asthma 07/06/2011   Osteoarthritis, knee 02/03/2010   Morbid obesity with BMI of 50 to 60 07/23/2009   Uncontrolled type 2 diabetes mellitus with hyperglycemia (HCC) 01/01/2006   Hyperlipidemia 01/01/2006   GERD 01/01/2006   Past Medical History:  Diagnosis Date   Acute nonintractable headache 12/26/2017   Arthritis    major joints   Asthma    prn inhaler   Asthma exacerbation 07/25/2013   Asthma in adult, severe persistent, with acute exacerbation (HCC) 01/11/2018   Carpal tunnel syndrome of right wrist 09/2012   Cataract    NS OU   Complication of anesthesia    states is hard to wake up post-op   Constipation 12/30/2014   Diabetes mellitus    intolerant to Metformin    Diabetic retinopathy (HCC)    NPDR OU   Dry skin    hands    Family history of anesthesia complication    mother went into coma during c-section   Genital herpes 01/01/2006   on acyclovir  BID    GERD (gastroesophageal reflux disease)    Herpes genital    Hyperglycemia 12/26/2017   Hypertensive retinopathy    OU   Low back pain 11/22/2012   Lower extremity edema 10/18/2012   Menorrhagia 11/06/2015   Morbid obesity with BMI of 50 to 60 07/23/2009   Obesity hypoventilation syndrome (HCC) 10/30/2012   Obesity, morbid (HCC) 01/10/2014   OSA on CPAP     AHI was on  11-17-12 to 120/hr, titrated to 15 cm with 3 cm EPR/    PCOS (polycystic ovarian syndrome)     Rheumatoid arthritis (HCC)    Right ankle pain 06/19/2015   Right hip pain 09/04/2017   Right shoulder pain 06/01/2017   Seasonal allergies    Sleep apnea, obstructive    uses CPAP nightly - had sleep study 08/22/2007   Family History  Problem Relation Age of Onset   Other Mother    Diabetes Maternal Aunt    Heart attack Maternal Aunt    Alzheimer's disease Maternal Uncle    Cancer Maternal Uncle        prostate   Diabetes Paternal Aunt    Cancer Paternal Aunt        brain,mouth   Alzheimer's disease Paternal Aunt    Colon polyps Paternal  Uncle    Diabetes Paternal Uncle    Diabetes Maternal Grandmother    Diabetes Maternal Grandfather    Leukemia Cousin    Other Other    Obesity Other    Other Other    Colitis Neg Hx    Past Surgical History:  Procedure Laterality Date   CARPAL TUNNEL RELEASE Right 10/02/2012   Procedure: RIGHT CARPAL TUNNEL RELEASE ENDOSCOPIC;  Surgeon: Alm DELENA Hummer, MD;  Location: [facility];  Service: Orthopedics;  Laterality: Right;   CHOLECYSTECTOMY N/A 12/02/2014   Procedure: LAPAROSCOPIC CHOLECYSTECTOMY;  Surgeon: Krystal Spinner, MD;  Location: [facility];  Service: General;  Laterality: N/A;   HYSTEROSCOPY WITH D & C  01-08-2002   Social History   Occupational History   Occupation: UNEMPLOYED    Employer: UNEMPLOYED  Tobacco Use   Smoking status: Former    Current packs/day: 0.00    Types: Cigarettes    Start date: 05/23/2000    Quit date: 05/24/2010    Years since quitting: 13.5   Smokeless tobacco: Never  Vaping Use   Vaping status: Never Used  Substance and Sexual Activity   Alcohol use: Yes    Alcohol/week: 0.0 standard drinks of alcohol    Comment: socially   Drug use: No   Sexual activity: Yes    Birth control/protection: None    Comment: Lives w/a partner

## 2023-12-21 NOTE — Progress Notes (Unsigned)
 Pain Scale   Average Pain 6 Patient advising she has chronic lower back pain radiating to right side, pain is constant.        +Driver, -BT, -Dye Allergies.

## 2023-12-26 ENCOUNTER — Encounter: Payer: Self-pay | Admitting: Radiology

## 2023-12-27 NOTE — Progress Notes (Shared)
 [facility] - Clinic Note  12/30/2023     CHIEF COMPLAINT Patient presents for No chief complaint on file.  HISTORY OF PRESENT ILLNESS: Ana Long is a 48 y.o. female who presents to the clinic today for:     Pt states she got new rx specs. She's still having issues seeing stuff up close. Seeing floaters in OD. Less in OS.   Referring physician: Octavia Charlie Hamilton MD [address] [place], [place] [address]  HISTORICAL INFORMATION:   Selected notes from the MEDICAL RECORD NUMBER Re-referred by Dr. Hamilton Octavia for DM exam   CURRENT MEDICATIONS: Current Outpatient Medications (Ophthalmic Drugs)  Medication Sig   Olopatadine HCl 0.2 % SOLN Place one drop into both eyes daily.   No current facility-administered medications for this visit. (Ophthalmic Drugs)   Current Outpatient Medications (Other)  Medication Sig   acyclovir  (ZOVIRAX ) 400 MG tablet TAKE 1 TABLET BY MOUTH TWICE A DAY   albuterol  (PROAIR  HFA) 108 (90 Base) MCG/ACT inhaler Inhale 2 puffs into the lungs every 4 (four) hours as needed for wheezing or shortness of breath (cough). (Patient taking differently: Inhale 2 puffs into the lungs every 4 (four) hours as needed for wheezing or shortness of breath (or coughing).)   atorvastatin  (LIPITOR) 10 MG tablet Take 10 mg by mouth daily.   benzonatate  (TESSALON ) 200 MG capsule Take 1 capsule (200 mg total) by mouth 3 (three) times daily as needed for cough.   Blood Glucose Monitoring Suppl (ACCU-CHEK AVIVA PLUS) w/Device KIT Please check you blood sugars 4 times a day   CIPRODEX OTIC suspension Place into the right ear.   cyclobenzaprine  (FLEXERIL ) 10 MG tablet Take 1 tablet (10 mg total) by mouth 3 (three) times daily as needed for muscle spasms.   DERMA-SMOOTHE/FS SCALP 0.01 % OIL Apply topically.   diazepam (VALIUM) 5 MG tablet Take one tablet by mouth with light food one hour prior to procedure.   diclofenac  (VOLTAREN ) 75 MG EC tablet Take 1  tablet (75 mg total) by mouth 2 (two) times daily.   fluconazole  (DIFLUCAN ) 150 MG tablet TAKE 1 TABLET BY MOUTH NOW. THEN REPEAT IN 1 WEEK   furosemide  (LASIX ) 20 MG tablet Take 20 mg by mouth daily as needed for fluid or edema.   glucose blood (ACCU-CHEK SMARTVIEW) test strip TEST FOUR TIMES DAILY   HUMULIN  R U-500 KWIKPEN 500 UNIT/ML kwikpen Inject into the skin.   hydrochlorothiazide (HYDRODIURIL) 12.5 MG tablet Take by mouth.   Insulin  Pen Needle (B-D UF III MINI PEN NEEDLES) 31G X 5 MM MISC USE AS DIRECTED TO INJECT INSULIN  FIVE A DAY E11.65   lactulose  (CEPHULAC ) 10 g packet Take 1 packet (10 g total) by mouth daily as needed. Reported on 07/29/2015 (Patient taking differently: Take 10 g by mouth daily as needed (for constipation).)   Lancets (ACCU-CHEK SOFT TOUCH) lancets Use as instructed   loratadine  (CLARITIN ) 10 MG tablet Take 1 tablet (10 mg total) by mouth 2 (two) times daily.   mometasone -formoterol  (DULERA ) 200-5 MCG/ACT AERO Inhale 2 puffs into the lungs 2 (two) times daily.   montelukast  (SINGULAIR ) 10 MG tablet Take 1 tablet (10 mg total) by mouth at bedtime.   nitroGLYCERIN  (NITRODUR - DOSED IN MG/24 HR) 0.2 mg/hr patch Apply 1/4th patch to affected achilles, change daily   omeprazole  (PRILOSEC) 20 MG capsule Take 1 capsule (20 mg total) by mouth daily before breakfast.   predniSONE  (DELTASONE ) 10 MG tablet 6  tabs po day 1, 5 tabs po day 2, 4 tabs po day 3, 3 tabs po day 4, 2 tabs po day 5, 1 tab po day 6   SPIRIVA RESPIMAT 1.25 MCG/ACT AERS SMARTSIG:2 Puff(s) Via Inhaler Daily   sulfamethoxazole -trimethoprim  (BACTRIM  DS) 800-160 MG tablet Take 1 tablet by mouth 2 (two) times daily.   topiramate  (TOPAMAX ) 25 MG tablet Take by mouth.   traMADol  (ULTRAM ) 50 MG tablet Take 1 tablet (50 mg total) by mouth every 6 (six) hours as needed.   Current Facility-Administered Medications (Other)  Medication Route   diphenhydrAMINE  (BENADRYL ) injection 12.5 mg Intramuscular   REVIEW OF  SYSTEMS:      ALLERGIES Allergies  Allergen Reactions   Bee Venom Anaphylaxis and Swelling   Wasp Venom Anaphylaxis and Swelling   Citrus Hives and Itching   Latex Hives and Swelling   Penicillins Hives and Itching    Has patient had a PCN reaction causing immediate rash, facial/tongue/throat swelling, SOB or lightheadedness with hypotension: Yes Has patient had a PCN reaction causing severe rash involving mucus membranes or skin necrosis: Unk Has patient had a PCN reaction that required hospitalization: Was already in hosp Has patient had a PCN reaction occurring within the last 10 years: No If all of the above answers are NO, then may proceed with Cephalosporin use.    Shellfish Allergy Other (See Comments) and Swelling    Patient is uncertain; stated that she tolerates this (??)   Soap Hives, Itching and Swelling    PUREX LAUNDRY DETERGENT   Tomato Hives and Itching   Banana Itching and Nausea Only   Chocolate Itching   Fluticasone  Other (See Comments)    Per patient, makes sneeze and gag   Hydrocodone  Itching and Nausea Only   Other Itching    Acidic foods- Itching [place]- Itching in the roof of the mouth   Adhesive [Tape] Itching and Rash    Allergic to  leads   Doxycycline Other (See Comments)    AFFECTS JOINTS   Liraglutide Nausea Only   Metformin  And Related Nausea Only, Rash, Other (See Comments) and Diarrhea    GI UPSET, also   Red Dye #40 (Allura Red) Itching, Rash and Other (See Comments)    At eye doctor's office, the dilating drops- Skin breaks out, chest turned red, itching, face became swollen   PAST MEDICAL HISTORY Past Medical History:  Diagnosis Date   Acute nonintractable headache 12/26/2017   Arthritis    major joints   Asthma    prn inhaler   Asthma exacerbation 07/25/2013   Asthma in adult, severe persistent, with acute exacerbation (HCC) 01/11/2018   Carpal tunnel syndrome of right wrist 09/2012   Cataract    NS OU   Complication of  anesthesia    states is hard to wake up post-op   Constipation 12/30/2014   Diabetes mellitus    intolerant to Metformin    Diabetic retinopathy (HCC)    NPDR OU   Dry skin    hands    Family history of anesthesia complication    mother went into coma during c-section   Genital herpes 01/01/2006   on acyclovir  BID    GERD (gastroesophageal reflux disease)    Herpes genital    Hyperglycemia 12/26/2017   Hypertensive retinopathy    OU   Low back pain 11/22/2012   Lower extremity edema 10/18/2012   Menorrhagia 11/06/2015   Morbid obesity with BMI of 50 to 60 07/23/2009  Obesity hypoventilation syndrome (HCC) 10/30/2012   Obesity, morbid (HCC) 01/10/2014   OSA on CPAP     AHI was on  11-17-12 to 120/hr, titrated to 15 cm with 3 cm EPR/    PCOS (polycystic ovarian syndrome)    Rheumatoid arthritis (HCC)    Right ankle pain 06/19/2015   Right hip pain 09/04/2017   Right shoulder pain 06/01/2017   Seasonal allergies    Sleep apnea, obstructive    uses CPAP nightly - had sleep study 08/22/2007   Past Surgical History:  Procedure Laterality Date   CARPAL TUNNEL RELEASE Right 10/02/2012   Procedure: RIGHT CARPAL TUNNEL RELEASE ENDOSCOPIC;  Surgeon: Alm DELENA Hummer, MD;  Location: [facility];  Service: Orthopedics;  Laterality: Right;   CHOLECYSTECTOMY N/A 12/02/2014   Procedure: LAPAROSCOPIC CHOLECYSTECTOMY;  Surgeon: Krystal Spinner, MD;  Location: [facility];  Service: General;  Laterality: N/A;   HYSTEROSCOPY WITH D & C  01-08-2002   FAMILY HISTORY Family History  Problem Relation Age of Onset   Other Mother    Diabetes Maternal Aunt    Heart attack Maternal Aunt    Alzheimer's disease Maternal Uncle    Cancer Maternal Uncle        prostate   Diabetes Paternal Aunt    Cancer Paternal Aunt        brain,mouth   Alzheimer's disease Paternal Aunt    Colon polyps Paternal Uncle    Diabetes Paternal Uncle    Diabetes Maternal Grandmother    Diabetes Maternal Grandfather     Leukemia Cousin    Other Other    Obesity Other    Other Other    Colitis Neg Hx    SOCIAL HISTORY Social History   Tobacco Use   Smoking status: Former    Current packs/day: 0.00    Types: Cigarettes    Start date: 05/23/2000    Quit date: 05/24/2010    Years since quitting: 13.6   Smokeless tobacco: Never  Vaping Use   Vaping status: Never Used  Substance Use Topics   Alcohol use: Yes    Alcohol/week: 0.0 standard drinks of alcohol    Comment: socially   Drug use: No       OPHTHALMIC EXAM: Not recorded    IMAGING AND PROCEDURES  Imaging and Procedures for @TODAY @          ASSESSMENT/PLAN:  No diagnosis found.  1-3.  Moderate non-proliferative diabetic retinopathy w/ DME, OU  - delayed f/u -- 11.5 wks instead of 4 (10.25.24 to 01.15.25)  - A1c 10.3 on 06.17.25  - pt lost to f/u from 10.23.2019 to 11.18.20  - FA in 2019 showed leaking MA OU; no NV OU **of note, pt had pruritic rxn to fluorescein  dye -- resolved with 12.5 mg IV benadryl **  - FA 12.16.2020 shows late leaking MA's, non-perfusion defects; no NV OU - s/p IVA OD #1 (12.16.20), #2 (01.25.21), #3 (05.26.21), #4 (07.02.21), #5 (10.15.21), #6 (11.23.21), #7 (07.27.22), #8 (09.09.22), #9 (11.11.22), #10 (01.20.23), #11 (02.23.23), #12 (04.09.23), #13 (05.23.23), #14 (06.30.23), #15 (08.04.23), #16 (09.01.23), #17 (10.06.23), #18 (11.10.23) #19 (12.15.23), #20 (01.19.24), #21 (02.22.24), #22 (04.09.24), #23 (06.28.24), #24 (07.29.24), #25 (09.27.24), #26 (10.25.24), #27 (04.02.25) -- IVA resistance - s/p IVA OS #1 (12.23.20), #2 (01.27.21), #3 (03.02.21), #4 (05.26.21), #5 (07.02.21), #6 (10.15.21), #7 (11.23.21), #8 (01.11.22), #9 (03.04.22), #10 (04.27.22), #11 (11.14.22), #12 (01.10.23), #13 (02.20.23), #14 (04.05.23), #15 (05.19.23), #16 (06.23.23), #17 (07.28.23), #18 (09.01.23), #19 (10.06.23), #20 (11.10.23) #  21 (12.15.23), #22 (01.19.24), #23 (02.22.24), #24 (04.01.24), #25 (04.30.24), #26 (05.28.24), #27  (06.28.24) -- IVA Resistance - s/p IVE OD #1 (05.02.25) #2(05.30.25), #3 (06.27.25), #4 (07.30.25), #5 (09.09.25) #6(10.10.25) ======================================  - s/p focal laser OD (03.19.21), (04.01.22) ====================================== - s/p IVE OS #1 (07.29.24), #2 (08.30.24), #3 (09.24.24), #4 (10.25.24), #5 (01.15.25) -- IVE resistance ====================================== - s/p IVV OS #1 (02.26.25), #2 (03.26.25), #3 (05.02.25), #4 (05.30.25), #5 (06.27.25), # 6 (08.08.25), #7 (09.11.25) #8(10.10.25) ======================================  - exam shows scattered MA, IRH, exudates and mild macular edema OU  - BCVA OD 20/20, OS: 20/25-decreased - OCT shows OD: Persistent IRF/cystic changes temporal macula--slightly increased, scattered punctate IRHM; OS: Focal IRF / IRHM temporal mac, scattered IRHM at 4 weeks - Recommend IVE OD #7 and IVV OS #9 today, 11.07.25 w/ f/u in 4-5 wks  - pt wishes to proceed with injection - RBA of procedure discussed, questions answered - see procedure note  - [facility] informed consent form was signed and scanned on 02.26.25 (OU)  - [facility]  informed consent form was signed and scanned on 05.02.25 (OD)  - f/u 4-5 weeks - DFE/OCT, possible injxns  4,5. Hypertensive retinopathy OU  - discussed importance of tight BP control             - continue to monitor  6. Nuclear Sclerosis  - The symptoms of cataract, surgical options, and treatments and risks were discussed with patient.   - discussed diagnosis and progression  - under the expert management of [facility]  7. Dry eyes OU - recommend artificial tears and lubricating ointment as needed  No follow-ups on file.   Ophthalmic Meds Ordered this visit:  No orders of the defined types were placed in this encounter.    This document serves as a record of services personally performed by Redell JUDITHANN Hans, MD, PhD. It was created on their behalf by Delon Newness COT, an ophthalmic  technician. The creation of this record is the provider's dictation and/or activities during the visit.    Electronically signed by: Delon Newness COT 11.04.25 2:18 PM   Abbreviations: M myopia (nearsighted); A astigmatism; H hyperopia (farsighted); P presbyopia; Mrx spectacle prescription;  CTL contact lenses; OD right eye; OS left eye; OU both eyes  XT exotropia; ET esotropia; PEK punctate epithelial keratitis; PEE punctate epithelial erosions; DES dry eye syndrome; MGD meibomian gland dysfunction; ATs artificial tears; PFAT's preservative free artificial tears; NSC nuclear sclerotic cataract; PSC posterior subcapsular cataract; ERM epi-retinal membrane; PVD posterior vitreous detachment; RD retinal detachment; DM diabetes mellitus; DR diabetic retinopathy; NPDR non-proliferative diabetic retinopathy; PDR proliferative diabetic retinopathy; CSME clinically significant macular edema; DME diabetic macular edema; dbh dot blot hemorrhages; CWS cotton wool spot; POAG primary open angle glaucoma; C/D cup-to-disc ratio; HVF humphrey visual field; GVF goldmann visual field; OCT optical coherence tomography; IOP intraocular pressure; BRVO Branch retinal vein occlusion; CRVO central retinal vein occlusion; CRAO central retinal artery occlusion; BRAO branch retinal artery occlusion; RT retinal tear; SB scleral buckle; PPV pars plana vitrectomy; VH Vitreous hemorrhage; PRP panretinal laser photocoagulation; IVK intravitreal kenalog ; VMT vitreomacular traction; MH Macular hole;  NVD neovascularization of the disc; NVE neovascularization elsewhere; AREDS age related eye disease study; ARMD age related macular degeneration; POAG primary open angle glaucoma; EBMD epithelial/anterior basement membrane dystrophy; ACIOL anterior chamber intraocular lens; IOL intraocular lens; PCIOL posterior chamber intraocular lens; Phaco/IOL phacoemulsification with intraocular lens placement; PRK photorefractive keratectomy; LASIK  laser assisted in situ keratomileusis; HTN hypertension; DM diabetes mellitus; COPD  chronic obstructive pulmonary disease

## 2023-12-30 ENCOUNTER — Encounter (INDEPENDENT_AMBULATORY_CARE_PROVIDER_SITE_OTHER): Admitting: Ophthalmology

## 2023-12-30 DIAGNOSIS — Z7984 Long term (current) use of oral hypoglycemic drugs: Secondary | ICD-10-CM

## 2023-12-30 DIAGNOSIS — H2513 Age-related nuclear cataract, bilateral: Secondary | ICD-10-CM

## 2023-12-30 DIAGNOSIS — H35033 Hypertensive retinopathy, bilateral: Secondary | ICD-10-CM

## 2023-12-30 DIAGNOSIS — Z794 Long term (current) use of insulin: Secondary | ICD-10-CM

## 2023-12-30 DIAGNOSIS — E113313 Type 2 diabetes mellitus with moderate nonproliferative diabetic retinopathy with macular edema, bilateral: Secondary | ICD-10-CM

## 2023-12-30 DIAGNOSIS — I10 Essential (primary) hypertension: Secondary | ICD-10-CM

## 2023-12-30 DIAGNOSIS — H04123 Dry eye syndrome of bilateral lacrimal glands: Secondary | ICD-10-CM

## 2024-01-03 ENCOUNTER — Encounter (INDEPENDENT_AMBULATORY_CARE_PROVIDER_SITE_OTHER): Payer: Self-pay | Admitting: Ophthalmology

## 2024-01-03 ENCOUNTER — Ambulatory Visit (INDEPENDENT_AMBULATORY_CARE_PROVIDER_SITE_OTHER): Admitting: Ophthalmology

## 2024-01-03 DIAGNOSIS — H2513 Age-related nuclear cataract, bilateral: Secondary | ICD-10-CM

## 2024-01-03 DIAGNOSIS — E113313 Type 2 diabetes mellitus with moderate nonproliferative diabetic retinopathy with macular edema, bilateral: Secondary | ICD-10-CM | POA: Diagnosis not present

## 2024-01-03 DIAGNOSIS — I10 Essential (primary) hypertension: Secondary | ICD-10-CM

## 2024-01-03 DIAGNOSIS — H04123 Dry eye syndrome of bilateral lacrimal glands: Secondary | ICD-10-CM

## 2024-01-03 DIAGNOSIS — Z7984 Long term (current) use of oral hypoglycemic drugs: Secondary | ICD-10-CM

## 2024-01-03 DIAGNOSIS — H35033 Hypertensive retinopathy, bilateral: Secondary | ICD-10-CM

## 2024-01-03 DIAGNOSIS — Z794 Long term (current) use of insulin: Secondary | ICD-10-CM | POA: Diagnosis not present

## 2024-01-03 MED ORDER — AFLIBERCEPT 2MG/0.05ML IZ SOLN FOR KALEIDOSCOPE
2.0000 mg | INTRAVITREAL | Status: AC | PRN
  Administered 2024-01-03: 2 mg via INTRAVITREAL

## 2024-01-03 MED ORDER — FARICIMAB-SVOA 6 MG/0.05ML IZ SOSY
6.0000 mg | PREFILLED_SYRINGE | INTRAVITREAL | Status: AC | PRN
  Administered 2024-01-03: 6 mg via INTRAVITREAL

## 2024-01-03 NOTE — Progress Notes (Signed)
 [facility] - Clinic Note  01/03/2024     CHIEF COMPLAINT Patient presents for Retina Follow Up  HISTORY OF PRESENT ILLNESS: Ana Long is a 48 y.o. female who presents to the clinic today for:   HPI     Retina Follow Up   Patient presents with  Diabetic Retinopathy.  In both eyes.  This started 4 weeks ago.  Duration of 4 weeks.  Since onset it is stable.  I, the attending physician,  performed the HPI with the patient and updated documentation appropriately.        Comments   Pt denies any changes in vision, PALs are about 2 months old but having issues with NVA alignment. Pt denies FOL/floaters/pain. Pt has had swollen tear ducts due to seasonal allergies. Pt states proparacaine causes her eyes to burn and itch, she did not need them for IOP check.      Last edited by Valdemar Rogue, MD on 01/03/2024 11:55 PM.    Pt states vision is good. Pt on steroids for her allergies and breathing. A1C is 11.3 a/o 11.5.25  Referring physician: Octavia Charlie Hamilton MD [address] [place], [place] [address]  HISTORICAL INFORMATION:   Selected notes from the MEDICAL RECORD NUMBER Re-referred by Dr. Hamilton Octavia for DM exam   CURRENT MEDICATIONS: Current Outpatient Medications (Ophthalmic Drugs)  Medication Sig   Olopatadine HCl 0.2 % SOLN Place one drop into both eyes daily.   No current facility-administered medications for this visit. (Ophthalmic Drugs)   Current Outpatient Medications (Other)  Medication Sig   acyclovir  (ZOVIRAX ) 400 MG tablet TAKE 1 TABLET BY MOUTH TWICE A DAY   albuterol  (PROAIR  HFA) 108 (90 Base) MCG/ACT inhaler Inhale 2 puffs into the lungs every 4 (four) hours as needed for wheezing or shortness of breath (cough). (Patient taking differently: Inhale 2 puffs into the lungs every 4 (four) hours as needed for wheezing or shortness of breath (or coughing).)   atorvastatin  (LIPITOR) 10 MG tablet Take 10 mg by mouth daily.   benzonatate   (TESSALON ) 200 MG capsule Take 1 capsule (200 mg total) by mouth 3 (three) times daily as needed for cough.   Blood Glucose Monitoring Suppl (ACCU-CHEK AVIVA PLUS) w/Device KIT Please check you blood sugars 4 times a day   CIPRODEX OTIC suspension Place into the right ear.   cyclobenzaprine  (FLEXERIL ) 10 MG tablet Take 1 tablet (10 mg total) by mouth 3 (three) times daily as needed for muscle spasms.   DERMA-SMOOTHE/FS SCALP 0.01 % OIL Apply topically.   diazepam (VALIUM) 5 MG tablet Take one tablet by mouth with light food one hour prior to procedure.   diclofenac  (VOLTAREN ) 75 MG EC tablet Take 1 tablet (75 mg total) by mouth 2 (two) times daily.   fluconazole  (DIFLUCAN ) 150 MG tablet TAKE 1 TABLET BY MOUTH NOW. THEN REPEAT IN 1 WEEK   furosemide  (LASIX ) 20 MG tablet Take 20 mg by mouth daily as needed for fluid or edema.   glucose blood (ACCU-CHEK SMARTVIEW) test strip TEST FOUR TIMES DAILY   HUMULIN  R U-500 KWIKPEN 500 UNIT/ML kwikpen Inject into the skin.   hydrochlorothiazide (HYDRODIURIL) 12.5 MG tablet Take by mouth.   Insulin  Pen Needle (B-D UF III MINI PEN NEEDLES) 31G X 5 MM MISC USE AS DIRECTED TO INJECT INSULIN  FIVE A DAY E11.65   lactulose  (CEPHULAC ) 10 g packet Take 1 packet (10 g total) by mouth daily as needed. Reported on 07/29/2015 (Patient  taking differently: Take 10 g by mouth daily as needed (for constipation).)   Lancets (ACCU-CHEK SOFT TOUCH) lancets Use as instructed   loratadine  (CLARITIN ) 10 MG tablet Take 1 tablet (10 mg total) by mouth 2 (two) times daily.   mometasone -formoterol  (DULERA ) 200-5 MCG/ACT AERO Inhale 2 puffs into the lungs 2 (two) times daily.   montelukast  (SINGULAIR ) 10 MG tablet Take 1 tablet (10 mg total) by mouth at bedtime.   nitroGLYCERIN  (NITRODUR - DOSED IN MG/24 HR) 0.2 mg/hr patch Apply 1/4th patch to affected achilles, change daily   omeprazole  (PRILOSEC) 20 MG capsule Take 1 capsule (20 mg total) by mouth daily before breakfast.   predniSONE   (DELTASONE ) 10 MG tablet 6 tabs po day 1, 5 tabs po day 2, 4 tabs po day 3, 3 tabs po day 4, 2 tabs po day 5, 1 tab po day 6   SPIRIVA RESPIMAT 1.25 MCG/ACT AERS SMARTSIG:2 Puff(s) Via Inhaler Daily   sulfamethoxazole -trimethoprim  (BACTRIM  DS) 800-160 MG tablet Take 1 tablet by mouth 2 (two) times daily.   topiramate  (TOPAMAX ) 25 MG tablet Take by mouth.   traMADol  (ULTRAM ) 50 MG tablet Take 1 tablet (50 mg total) by mouth every 6 (six) hours as needed.   Current Facility-Administered Medications (Other)  Medication Route   diphenhydrAMINE  (BENADRYL ) injection 12.5 mg Intramuscular   REVIEW OF SYSTEMS: ROS   Positive for: Endocrine, Eyes, Respiratory Negative for: Constitutional, Gastrointestinal, Neurological, Skin, Genitourinary, Musculoskeletal, HENT, Cardiovascular, Psychiatric, Allergic/Imm, Heme/Lymph Last edited by [name], COT on 01/03/2024  1:12 PM.     ALLERGIES Allergies  Allergen Reactions   Bee Venom Anaphylaxis and Swelling   Wasp Venom Anaphylaxis and Swelling   Citrus Hives and Itching   Latex Hives and Swelling   Penicillins Hives and Itching    Has patient had a PCN reaction causing immediate rash, facial/tongue/throat swelling, SOB or lightheadedness with hypotension: Yes Has patient had a PCN reaction causing severe rash involving mucus membranes or skin necrosis: Unk Has patient had a PCN reaction that required hospitalization: Was already in hosp Has patient had a PCN reaction occurring within the last 10 years: No If all of the above answers are NO, then may proceed with Cephalosporin use.    Shellfish Allergy Other (See Comments) and Swelling    Patient is uncertain; stated that she tolerates this (??)   Soap Hives, Itching and Swelling    PUREX LAUNDRY DETERGENT   Tomato Hives and Itching   Banana Itching and Nausea Only   Chocolate Itching   Fluticasone  Other (See Comments)    Per patient, makes sneeze and gag   Hydrocodone  Itching and  Nausea Only   Other Itching    Acidic foods- Itching [place]- Itching in the roof of the mouth   Adhesive [Tape] Itching and Rash    Allergic to  leads   Doxycycline Other (See Comments)    AFFECTS JOINTS   Liraglutide Nausea Only   Metformin  And Related Nausea Only, Rash, Other (See Comments) and Diarrhea    GI UPSET, also   Red Dye #40 (Allura Red) Itching, Rash and Other (See Comments)    At eye doctor's office, the dilating drops- Skin breaks out, chest turned red, itching, face became swollen   PAST MEDICAL HISTORY Past Medical History:  Diagnosis Date   Acute nonintractable headache 12/26/2017   Arthritis    major joints   Asthma    prn inhaler   Asthma exacerbation 07/25/2013   Asthma in  adult, severe persistent, with acute exacerbation (HCC) 01/11/2018   Carpal tunnel syndrome of right wrist 09/2012   Cataract    NS OU   Complication of anesthesia    states is hard to wake up post-op   Constipation 12/30/2014   Diabetes mellitus    intolerant to Metformin    Diabetic retinopathy (HCC)    NPDR OU   Dry skin    hands    Family history of anesthesia complication    mother went into coma during c-section   Genital herpes 01/01/2006   on acyclovir  BID    GERD (gastroesophageal reflux disease)    Herpes genital    Hyperglycemia 12/26/2017   Hypertensive retinopathy    OU   Low back pain 11/22/2012   Lower extremity edema 10/18/2012   Menorrhagia 11/06/2015   Morbid obesity with BMI of 50 to 60 07/23/2009   Obesity hypoventilation syndrome (HCC) 10/30/2012   Obesity, morbid (HCC) 01/10/2014   OSA on CPAP     AHI was on  11-17-12 to 120/hr, titrated to 15 cm with 3 cm EPR/    PCOS (polycystic ovarian syndrome)    Rheumatoid arthritis (HCC)    Right ankle pain 06/19/2015   Right hip pain 09/04/2017   Right shoulder pain 06/01/2017   Seasonal allergies    Sleep apnea, obstructive    uses CPAP nightly - had sleep study 08/22/2007   Past Surgical History:  Procedure  Laterality Date   CARPAL TUNNEL RELEASE Right 10/02/2012   Procedure: RIGHT CARPAL TUNNEL RELEASE ENDOSCOPIC;  Surgeon: Alm DELENA Hummer, MD;  Location: [facility];  Service: Orthopedics;  Laterality: Right;   CHOLECYSTECTOMY N/A 12/02/2014   Procedure: LAPAROSCOPIC CHOLECYSTECTOMY;  Surgeon: Krystal Spinner, MD;  Location: [facility];  Service: General;  Laterality: N/A;   HYSTEROSCOPY WITH D & C  01-08-2002   FAMILY HISTORY Family History  Problem Relation Age of Onset   Other Mother    Diabetes Maternal Aunt    Heart attack Maternal Aunt    Alzheimer's disease Maternal Uncle    Cancer Maternal Uncle        prostate   Diabetes Paternal Aunt    Cancer Paternal Aunt        brain,mouth   Alzheimer's disease Paternal Aunt    Colon polyps Paternal Uncle    Diabetes Paternal Uncle    Diabetes Maternal Grandmother    Diabetes Maternal Grandfather    Leukemia Cousin    Other Other    Obesity Other    Other Other    Colitis Neg Hx    SOCIAL HISTORY Social History   Tobacco Use   Smoking status: Former    Current packs/day: 0.00    Types: Cigarettes    Start date: 05/23/2000    Quit date: 05/24/2010    Years since quitting: 13.6   Smokeless tobacco: Never  Vaping Use   Vaping status: Never Used  Substance Use Topics   Alcohol use: Yes    Alcohol/week: 0.0 standard drinks of alcohol    Comment: socially   Drug use: No       OPHTHALMIC EXAM: Base Eye Exam     Visual Acuity (Snellen - Linear)       Right Left   Dist cc 20/20 20/20    Correction: Glasses         Tonometry (Tonopen, 1:17 PM)       Right Left   Pressure 15 18  Pupils       Pupils Dark Light Shape React APD   Right PERRL 5 3 Round Brisk None   Left PERRL 5 3 Round Brisk None         Visual Fields       Left Right    Full Full         Extraocular Movement       Right Left    Full, Ortho Full, Ortho         Neuro/Psych     Oriented x3: Yes   Mood/Affect:  Normal         Dilation     Both eyes: 1.0% Mydriacyl, 2.5% Phenylephrine @ 1:17 PM           Slit Lamp and Fundus Exam     Slit Lamp Exam       Right Left   Lids/Lashes Dermatochalasis - upper lid, mild Meibomian gland dysfunction Dermatochalasis - upper lid, mild Meibomian gland dysfunction   Conjunctiva/Sclera White and quiet White and quiet   Cornea 2+Punctate epithelial erosions, mild tear film debris Trace Punctate epithelial erosions   Anterior Chamber deep and quiet deep and quiet   Iris round and dilated, No NVI round and dilated   Lens 1+ NS, 1+CC 1+NS, 1+CC   Anterior Vitreous mild vitreous syneresis; PVD; mild vit condensations mild vitreous syneresis, scattered vitreous condensations, silicone oil micro bubbles trapped within vit         Fundus Exam       Right Left   Disc Pink and sharp, +cupping Pink and sharp, +cupping   C/D Ratio 0.7 0.6   Macula good foveal reflex, focal exudates ST macula -- improved, scattered MA - improved, edema and punctate exudates temporal macula -- slightly improved, focal laser changes inferior macula good foveal reflex, scatttered MA's and exudates, central cystic changes, focal cluster of exudates SN macula -- improving   Vessels attenuated, Tortuous attenuated, Tortuous   Periphery Attached, scattered MA/exudates greatest inferiorly, faint CWS nasal midzone Attached, scattered MA and exudates greatest posteriorly, white without pressure temporally           Refraction     Wearing Rx       Sphere Cylinder Axis Add   Right -3.75 +1.00 013 -2.25   Left -4.00 +1.00 039 -225.00    Age: 80 months   Type: Progressive           IMAGING AND PROCEDURES  Imaging and Procedures for @TODAY @  OCT, Retina - OU - Both Eyes       Right Eye Quality was good. Central Foveal Thickness: 222. Progression has improved. Findings include normal foveal contour, no SRF, intraretinal hyper-reflective material, intraretinal fluid,  vitreomacular adhesion (Persistent IRF/cystic changes temporal macula--slightly improved, scattered punctate IRHM ).   Left Eye Quality was good. Central Foveal Thickness: 228. Progression has improved. Findings include normal foveal contour, no SRF, intraretinal hyper-reflective material, intraretinal fluid (Focal IRF / IRHM temporal mac--improved, scattered IRHM--improved).   Notes *Images captured and stored on drive  Diagnosis / Impression:  +DME OU OD: Persistent IRF/cystic changes temporal macula--slighty improved, scattered punctate IRHM  OS: Focal IRF / IRHM temporal mac--improved, scattered IRHM--improved  Clinical management:  See below  Abbreviations: NFP - Normal foveal profile. CME - cystoid macular edema. PED - pigment epithelial detachment. IRF - intraretinal fluid. SRF - subretinal fluid. EZ - ellipsoid zone. ERM - epiretinal membrane. ORA - outer retinal atrophy. ORT - outer retinal tubulation.  SRHM - subretinal hyper-reflective material      Intravitreal Injection, Pharmacologic Agent - OD - Right Eye       Time Out 01/03/2024. 2:20 PM. Confirmed correct patient, procedure, site, and patient consented.   Anesthesia Topical anesthesia was used. Anesthetic medications included Lidocaine  2%, Proparacaine 0.5%.   Procedure Preparation included 5% betadine to ocular surface, eyelid speculum. A (32g) needle was used.   Injection: 2 mg aflibercept  2 MG/0.05ML   Route: Intravitreal, Site: Right Eye   NDC: D2246706, Lot: [id number], Expiration date: 03/23/2025, Waste: 0 mL   Post-op Post injection exam found visual acuity of at least counting fingers. The patient tolerated the procedure well. There were no complications. The patient received written and verbal post procedure care education. Post injection medications were not given.      Intravitreal Injection, Pharmacologic Agent - OS - Left Eye       Time Out 01/03/2024. 2:20 PM. Confirmed correct patient,  procedure, site, and patient consented.   Anesthesia Topical anesthesia was used. Anesthetic medications included Lidocaine  2%, Proparacaine 0.5%.   Procedure Preparation included 5% betadine to ocular surface, eyelid speculum. A supplied needle was used.   Injection: 6 mg faricimab -svoa 6 MG/0.05ML   Route: Intravitreal, Site: Left Eye   NDC: 49757-903-93, Lot: [id number], Expiration date: 11/21/2024, Waste: 0 mL   Post-op Post injection exam found visual acuity of at least counting fingers. The patient tolerated the procedure well. There were no complications. The patient received written and verbal post procedure care education. Post injection medications were not given.            ASSESSMENT/PLAN:    ICD-10-CM   1. Moderate nonproliferative diabetic retinopathy of both eyes with macular edema associated with type 2 diabetes mellitus (HCC)  E11.3313 OCT, Retina - OU - Both Eyes    Intravitreal Injection, Pharmacologic Agent - OD - Right Eye    Intravitreal Injection, Pharmacologic Agent - OS - Left Eye    faricimab -svoa (VABYSMO ) 6mg /0.62mL intravitreal injection    aflibercept  (EYLEA ) SOLN 2 mg    2. Current use of insulin  (HCC)  Z79.4     3. Long term (current) use of oral hypoglycemic drugs  Z79.84     4. Essential hypertension  I10     5. Hypertensive retinopathy of both eyes  H35.033     6. Nuclear sclerosis of both eyes  H25.13     7. Dry eyes  H04.123       1-3.  Moderate non-proliferative diabetic retinopathy w/ DME, OU  - delayed f/u -- 11.5 wks instead of 4 (10.25.24 to 01.15.25)  - A1c 11.3 on 11.05.25, 10.3 on 06.17.25  - pt lost to f/u from 10.23.2019 to 11.18.20  - FA in 2019 showed leaking MA OU; no NV OU **of note, pt had pruritic rxn to fluorescein  dye -- resolved with 12.5 mg IV benadryl **  - FA 12.16.2020 shows late leaking MA's, non-perfusion defects; no NV OU - s/p IVA OD #1 (12.16.20), #2 (01.25.21), #3 (05.26.21), #4 (07.02.21), #5 (10.15.21),  #6 (11.23.21), #7 (07.27.22), #8 (09.09.22), #9 (11.11.22), #10 (01.20.23), #11 (02.23.23), #12 (04.09.23), #13 (05.23.23), #14 (06.30.23), #15 (08.04.23), #16 (09.01.23), #17 (10.06.23), #18 (11.10.23) #19 (12.15.23), #20 (01.19.24), #21 (02.22.24), #22 (04.09.24), #23 (06.28.24), #24 (07.29.24), #25 (09.27.24), #26 (10.25.24), #27 (04.02.25) -- IVA resistance - s/p IVA OS #1 (12.23.20), #2 (01.27.21), #3 (03.02.21), #4 (05.26.21), #5 (07.02.21), #6 (10.15.21), #7 (11.23.21), #8 (01.11.22), #9 (03.04.22), #10 (04.27.22), #11 (11.14.22), #12 (01.10.23), #13 (  02.20.23), #14 (04.05.23), #15 (05.19.23), #16 (06.23.23), #17 (07.28.23), #18 (09.01.23), #19 (10.06.23), #20 (11.10.23) #21 (12.15.23), #22 (01.19.24), #23 (02.22.24), #24 (04.01.24), #25 (04.30.24), #26 (05.28.24), #27 (06.28.24) -- IVA Resistance - s/p IVE OD #1 (05.02.25) #2(05.30.25), #3 (06.27.25), #4 (07.30.25), #5 (09.09.25) #6(10.10.25) ======================================  - s/p focal laser OD (03.19.21), (04.01.22) ====================================== - s/p IVE OS #1 (07.29.24), #2 (08.30.24), #3 (09.24.24), #4 (10.25.24), #5 (01.15.25) -- IVE resistance ====================================== - s/p IVV OS #1 (02.26.25), #2 (03.26.25), #3 (05.02.25), #4 (05.30.25), #5 (06.27.25), # 6 (08.08.25), #7 (09.11.25) #8(10.10.25) ======================================  - exam shows scattered MA, IRH, exudates and mild macular edema OU  - BCVA OD 20/20, OS: 20/25-decreased - OCT shows OD: Persistent IRF/cystic changes temporal macula--slightly improved, scattered punctate IRHM; OS: Focal IRF / IRHM temporal mac--improved, scattered IRHM--improved at 4+ weeks - Recommend IVE OD #7 and IVV OS #9 today, 11.11.25 w/ f/u ext to 5 wks  - pt wishes to proceed with injection - RBA of procedure discussed, questions answered - see procedure note  - Vabsymo informed consent form was signed and scanned on 02.26.25 (OU)  - Eylea  informed consent form  was signed and scanned on 05.02.25 (OD)  - f/u 5 weeks - DFE/OCT, possible injxns  4,5. Hypertensive retinopathy OU  - discussed importance of tight BP control             - continue to monitor  6. Nuclear Sclerosis  - The symptoms of cataract, surgical options, and treatments and risks were discussed with patient.   - discussed diagnosis and progression  - under the expert management of [facility]  7. Dry eyes OU - recommend artificial tears and lubricating ointment as needed  Return in about 5 weeks (around 02/07/2024) for NPDR OU, DFE, OCT, Possible Injxn.   Ophthalmic Meds Ordered this visit:  Meds ordered this encounter  Medications   faricimab -svoa (VABYSMO ) 6mg /0.16mL intravitreal injection   aflibercept  (EYLEA ) SOLN 2 mg     This document serves as a record of services personally performed by Redell JUDITHANN Hans, MD, PhD. It was created on their behalf by Almetta Pesa, an ophthalmic technician. The creation of this record is the provider's dictation and/or activities during the visit.    Electronically signed by: Almetta Pesa, OA, 01/08/24  9:35 PM  Redell JUDITHANN Hans, M.D., Ph.D. Diseases & Surgery of the Retina and Vitreous [facility] 01/03/2024   I have reviewed the above documentation for accuracy and completeness, and I agree with the above. Redell JUDITHANN Hans, M.D., Ph.D. 01/08/24 9:40 PM   Abbreviations: M myopia (nearsighted); A astigmatism; H hyperopia (farsighted); P presbyopia; Mrx spectacle prescription;  CTL contact lenses; OD right eye; OS left eye; OU both eyes  XT exotropia; ET esotropia; PEK punctate epithelial keratitis; PEE punctate epithelial erosions; DES dry eye syndrome; MGD meibomian gland dysfunction; ATs artificial tears; PFAT's preservative free artificial tears; NSC nuclear sclerotic cataract; PSC posterior subcapsular cataract; ERM epi-retinal membrane; PVD posterior vitreous detachment; RD retinal detachment; DM  diabetes mellitus; DR diabetic retinopathy; NPDR non-proliferative diabetic retinopathy; PDR proliferative diabetic retinopathy; CSME clinically significant macular edema; DME diabetic macular edema; dbh dot blot hemorrhages; CWS cotton wool spot; POAG primary open angle glaucoma; C/D cup-to-disc ratio; HVF humphrey visual field; GVF goldmann visual field; OCT optical coherence tomography; IOP intraocular pressure; BRVO Branch retinal vein occlusion; CRVO central retinal vein occlusion; CRAO central retinal artery occlusion; BRAO branch retinal artery occlusion; RT retinal tear; SB scleral buckle; PPV pars  plana vitrectomy; VH Vitreous hemorrhage; PRP panretinal laser photocoagulation; IVK intravitreal kenalog ; VMT vitreomacular traction; MH Macular hole;  NVD neovascularization of the disc; NVE neovascularization elsewhere; AREDS age related eye disease study; ARMD age related macular degeneration; POAG primary open angle glaucoma; EBMD epithelial/anterior basement membrane dystrophy; ACIOL anterior chamber intraocular lens; IOL intraocular lens; PCIOL posterior chamber intraocular lens; Phaco/IOL phacoemulsification with intraocular lens placement; PRK photorefractive keratectomy; LASIK laser assisted in situ keratomileusis; HTN hypertension; DM diabetes mellitus; COPD chronic obstructive pulmonary disease

## 2024-01-17 ENCOUNTER — Encounter: Admitting: Physical Medicine and Rehabilitation

## 2024-01-24 NOTE — Progress Notes (Shared)
 [facility] - Clinic Note  02/07/2024     CHIEF COMPLAINT Patient presents for No chief complaint on file.  HISTORY OF PRESENT ILLNESS: Ana Long is a 48 y.o. female who presents to the clinic today for:    Pt states vision is good. Pt on steroids for her allergies and breathing. A1C is 11.3 a/o 11.5.25  Referring physician: Octavia Charlie Hamilton MD [address] [place], [place] [address]  HISTORICAL INFORMATION:   Selected notes from the MEDICAL RECORD NUMBER Re-referred by Dr. Hamilton Octavia for DM exam   CURRENT MEDICATIONS: Current Outpatient Medications (Ophthalmic Drugs)  Medication Sig   Olopatadine HCl 0.2 % SOLN Place one drop into both eyes daily.   No current facility-administered medications for this visit. (Ophthalmic Drugs)   Current Outpatient Medications (Other)  Medication Sig   acyclovir  (ZOVIRAX ) 400 MG tablet TAKE 1 TABLET BY MOUTH TWICE A DAY   albuterol  (PROAIR  HFA) 108 (90 Base) MCG/ACT inhaler Inhale 2 puffs into the lungs every 4 (four) hours as needed for wheezing or shortness of breath (cough). (Patient taking differently: Inhale 2 puffs into the lungs every 4 (four) hours as needed for wheezing or shortness of breath (or coughing).)   atorvastatin  (LIPITOR) 10 MG tablet Take 10 mg by mouth daily.   benzonatate  (TESSALON ) 200 MG capsule Take 1 capsule (200 mg total) by mouth 3 (three) times daily as needed for cough.   Blood Glucose Monitoring Suppl (ACCU-CHEK AVIVA PLUS) w/Device KIT Please check you blood sugars 4 times a day   CIPRODEX OTIC suspension Place into the right ear.   cyclobenzaprine  (FLEXERIL ) 10 MG tablet Take 1 tablet (10 mg total) by mouth 3 (three) times daily as needed for muscle spasms.   DERMA-SMOOTHE/FS SCALP 0.01 % OIL Apply topically.   diazepam  (VALIUM ) 5 MG tablet Take one tablet by mouth with light food one hour prior to procedure.   diclofenac  (VOLTAREN ) 75 MG EC tablet Take 1 tablet (75 mg total)  by mouth 2 (two) times daily.   fluconazole  (DIFLUCAN ) 150 MG tablet TAKE 1 TABLET BY MOUTH NOW. THEN REPEAT IN 1 WEEK   furosemide  (LASIX ) 20 MG tablet Take 20 mg by mouth daily as needed for fluid or edema.   glucose blood (ACCU-CHEK SMARTVIEW) test strip TEST FOUR TIMES DAILY   HUMULIN  R U-500 KWIKPEN 500 UNIT/ML kwikpen Inject into the skin.   hydrochlorothiazide (HYDRODIURIL) 12.5 MG tablet Take by mouth.   Insulin  Pen Needle (B-D UF III MINI PEN NEEDLES) 31G X 5 MM MISC USE AS DIRECTED TO INJECT INSULIN  FIVE A DAY E11.65   lactulose  (CEPHULAC ) 10 g packet Take 1 packet (10 g total) by mouth daily as needed. Reported on 07/29/2015 (Patient taking differently: Take 10 g by mouth daily as needed (for constipation).)   Lancets (ACCU-CHEK SOFT TOUCH) lancets Use as instructed   loratadine  (CLARITIN ) 10 MG tablet Take 1 tablet (10 mg total) by mouth 2 (two) times daily.   mometasone -formoterol  (DULERA ) 200-5 MCG/ACT AERO Inhale 2 puffs into the lungs 2 (two) times daily.   montelukast  (SINGULAIR ) 10 MG tablet Take 1 tablet (10 mg total) by mouth at bedtime.   nitroGLYCERIN  (NITRODUR - DOSED IN MG/24 HR) 0.2 mg/hr patch Apply 1/4th patch to affected achilles, change daily   omeprazole  (PRILOSEC) 20 MG capsule Take 1 capsule (20 mg total) by mouth daily before breakfast.   predniSONE  (DELTASONE ) 10 MG tablet 6 tabs po day 1, 5 tabs  po day 2, 4 tabs po day 3, 3 tabs po day 4, 2 tabs po day 5, 1 tab po day 6   SPIRIVA RESPIMAT 1.25 MCG/ACT AERS SMARTSIG:2 Puff(s) Via Inhaler Daily   sulfamethoxazole -trimethoprim  (BACTRIM  DS) 800-160 MG tablet Take 1 tablet by mouth 2 (two) times daily.   topiramate  (TOPAMAX ) 25 MG tablet Take by mouth.   traMADol  (ULTRAM ) 50 MG tablet Take 1 tablet (50 mg total) by mouth every 6 (six) hours as needed.   Current Facility-Administered Medications (Other)  Medication Route   diphenhydrAMINE  (BENADRYL ) injection 12.5 mg Intramuscular   REVIEW OF  SYSTEMS:   ALLERGIES Allergies  Allergen Reactions   Bee Venom Anaphylaxis and Swelling   Wasp Venom Anaphylaxis and Swelling   Citrus Hives and Itching   Latex Hives and Swelling   Penicillins Hives and Itching    Has patient had a PCN reaction causing immediate rash, facial/tongue/throat swelling, SOB or lightheadedness with hypotension: Yes Has patient had a PCN reaction causing severe rash involving mucus membranes or skin necrosis: Unk Has patient had a PCN reaction that required hospitalization: Was already in hosp Has patient had a PCN reaction occurring within the last 10 years: No If all of the above answers are NO, then may proceed with Cephalosporin use.    Shellfish Allergy Other (See Comments) and Swelling    Patient is uncertain; stated that she tolerates this (??)   Soap Hives, Itching and Swelling    PUREX LAUNDRY DETERGENT   Tomato Hives and Itching   Banana Itching and Nausea Only   Chocolate Itching   Fluticasone  Other (See Comments)    Per patient, makes sneeze and gag   Hydrocodone  Itching and Nausea Only   Other Itching    Acidic foods- Itching [place]- Itching in the roof of the mouth   Adhesive [Tape] Itching and Rash    Allergic to  leads   Doxycycline Other (See Comments)    AFFECTS JOINTS   Liraglutide Nausea Only   Metformin  And Related Nausea Only, Rash, Other (See Comments) and Diarrhea    GI UPSET, also   Red Dye #40 (Allura Red) Itching, Rash and Other (See Comments)    At eye doctor's office, the dilating drops- Skin breaks out, chest turned red, itching, face became swollen   PAST MEDICAL HISTORY Past Medical History:  Diagnosis Date   Acute nonintractable headache 12/26/2017   Arthritis    major joints   Asthma    prn inhaler   Asthma exacerbation 07/25/2013   Asthma in adult, severe persistent, with acute exacerbation (HCC) 01/11/2018   Carpal tunnel syndrome of right wrist 09/2012   Cataract    NS OU   Complication of  anesthesia    states is hard to wake up post-op   Constipation 12/30/2014   Diabetes mellitus    intolerant to Metformin    Diabetic retinopathy (HCC)    NPDR OU   Dry skin    hands    Family history of anesthesia complication    mother went into coma during c-section   Genital herpes 01/01/2006   on acyclovir  BID    GERD (gastroesophageal reflux disease)    Herpes genital    Hyperglycemia 12/26/2017   Hypertensive retinopathy    OU   Low back pain 11/22/2012   Lower extremity edema 10/18/2012   Menorrhagia 11/06/2015   Morbid obesity with BMI of 50 to 60 07/23/2009   Obesity hypoventilation syndrome (HCC) 10/30/2012   Obesity, morbid (  HCC) 01/10/2014   OSA on CPAP     AHI was on  11-17-12 to 120/hr, titrated to 15 cm with 3 cm EPR/    PCOS (polycystic ovarian syndrome)    Rheumatoid arthritis (HCC)    Right ankle pain 06/19/2015   Right hip pain 09/04/2017   Right shoulder pain 06/01/2017   Seasonal allergies    Sleep apnea, obstructive    uses CPAP nightly - had sleep study 08/22/2007   Past Surgical History:  Procedure Laterality Date   CARPAL TUNNEL RELEASE Right 10/02/2012   Procedure: RIGHT CARPAL TUNNEL RELEASE ENDOSCOPIC;  Surgeon: Alm DELENA Hummer, MD;  Location: [facility];  Service: Orthopedics;  Laterality: Right;   CHOLECYSTECTOMY N/A 12/02/2014   Procedure: LAPAROSCOPIC CHOLECYSTECTOMY;  Surgeon: Krystal Spinner, MD;  Location: [facility];  Service: General;  Laterality: N/A;   HYSTEROSCOPY WITH D & C  01-08-2002   FAMILY HISTORY Family History  Problem Relation Age of Onset   Other Mother    Diabetes Maternal Aunt    Heart attack Maternal Aunt    Alzheimer's disease Maternal Uncle    Cancer Maternal Uncle        prostate   Diabetes Paternal Aunt    Cancer Paternal Aunt        brain,mouth   Alzheimer's disease Paternal Aunt    Colon polyps Paternal Uncle    Diabetes Paternal Uncle    Diabetes Maternal Grandmother    Diabetes Maternal Grandfather     Leukemia Cousin    Other Other    Obesity Other    Other Other    Colitis Neg Hx    SOCIAL HISTORY Social History   Tobacco Use   Smoking status: Former    Current packs/day: 0.00    Types: Cigarettes    Start date: 05/23/2000    Quit date: 05/24/2010    Years since quitting: 13.6   Smokeless tobacco: Never  Vaping Use   Vaping status: Never Used  Substance Use Topics   Alcohol use: Yes    Alcohol/week: 0.0 standard drinks of alcohol    Comment: socially   Drug use: No       OPHTHALMIC EXAM: Not recorded    IMAGING AND PROCEDURES  Imaging and Procedures for @TODAY @          ASSESSMENT/PLAN:  No diagnosis found.   1-3.  Moderate non-proliferative diabetic retinopathy w/ DME, OU  - delayed f/u -- 11.5 wks instead of 4 (10.25.24 to 01.15.25)  - A1c 11.3 on 11.05.25, 10.3 on 06.17.25  - pt lost to f/u from 10.23.2019 to 11.18.20  - FA in 2019 showed leaking MA OU; no NV OU **of note, pt had pruritic rxn to fluorescein  dye -- resolved with 12.5 mg IV benadryl **  - FA 12.16.2020 shows late leaking MA's, non-perfusion defects; no NV OU - s/p IVA OD #1 (12.16.20), #2 (01.25.21), #3 (05.26.21), #4 (07.02.21), #5 (10.15.21), #6 (11.23.21), #7 (07.27.22), #8 (09.09.22), #9 (11.11.22), #10 (01.20.23), #11 (02.23.23), #12 (04.09.23), #13 (05.23.23), #14 (06.30.23), #15 (08.04.23), #16 (09.01.23), #17 (10.06.23), #18 (11.10.23) #19 (12.15.23), #20 (01.19.24), #21 (02.22.24), #22 (04.09.24), #23 (06.28.24), #24 (07.29.24), #25 (09.27.24), #26 (10.25.24), #27 (04.02.25) -- IVA resistance - s/p IVA OS #1 (12.23.20), #2 (01.27.21), #3 (03.02.21), #4 (05.26.21), #5 (07.02.21), #6 (10.15.21), #7 (11.23.21), #8 (01.11.22), #9 (03.04.22), #10 (04.27.22), #11 (11.14.22), #12 (01.10.23), #13 (02.20.23), #14 (04.05.23), #15 (05.19.23), #16 (06.23.23), #17 (07.28.23), #18 (09.01.23), #19 (10.06.23), #20 (11.10.23) #21 (12.15.23), #22 (01.19.24), #23 (  02.22.24), #24 (04.01.24), #25 (04.30.24),  #26 (05.28.24), #27 (06.28.24) -- IVA Resistance - s/p IVE OD #1 (05.02.25) #2(05.30.25), #3 (06.27.25), #4 (07.30.25), #5 (09.09.25) #6(10.10.25), #7 (11.11.25) ======================================  - s/p focal laser OD (03.19.21), (04.01.22) ====================================== - s/p IVE OS #1 (07.29.24), #2 (08.30.24), #3 (09.24.24), #4 (10.25.24), #5 (01.15.25) -- IVE resistance ====================================== - s/p IVV OS #1 (02.26.25), #2 (03.26.25), #3 (05.02.25), #4 (05.30.25), #5 (06.27.25), # 6 (08.08.25), #7 (09.11.25) #8(10.10.25), #9 (11.11.25) ======================================  - exam shows scattered MA, IRH, exudates and mild macular edema OU  - BCVA OD 20/20, OS: 20/25-decreased - OCT shows OD: Persistent IRF/cystic changes temporal macula--slightly improved, scattered punctate IRHM; OS: Focal IRF / IRHM temporal mac--improved, scattered IRHM--improved at 4+ weeks - Recommend IVE OD #8 and IVV OS #10 today, 12.16.25 w/ f/u ext to 5 wks  - pt wishes to proceed with injection - RBA of procedure discussed, questions answered - see procedure note  - [facility] informed consent form was signed and scanned on 02.26.25 (OU)  - Eylea  informed consent form was signed and scanned on 05.02.25 (OD)  - f/u 5 weeks - DFE/OCT, possible injxns  4,5. Hypertensive retinopathy OU  - discussed importance of tight BP control             - continue to monitor  6. Nuclear Sclerosis  - The symptoms of cataract, surgical options, and treatments and risks were discussed with patient.   - discussed diagnosis and progression  - under the expert management of [facility]  7. Dry eyes OU - recommend artificial tears and lubricating ointment as needed  No follow-ups on file.   Ophthalmic Meds Ordered this visit:  No orders of the defined types were placed in this encounter.    This document serves as a record of services personally performed by Redell JUDITHANN Hans, MD, PhD. It was  created on their behalf by Wanda GEANNIE Keens, COT an ophthalmic technician. The creation of this record is the provider's dictation and/or activities during the visit.    Electronically signed by:  Wanda GEANNIE Keens, COT  01/24/24 7:29 AM   Redell JUDITHANN Hans, M.D., Ph.D. Diseases & Surgery of the Retina and Vitreous [facility] 01/03/2024      Abbreviations: M myopia (nearsighted); A astigmatism; H hyperopia (farsighted); P presbyopia; Mrx spectacle prescription;  CTL contact lenses; OD right eye; OS left eye; OU both eyes  XT exotropia; ET esotropia; PEK punctate epithelial keratitis; PEE punctate epithelial erosions; DES dry eye syndrome; MGD meibomian gland dysfunction; ATs artificial tears; PFAT's preservative free artificial tears; NSC nuclear sclerotic cataract; PSC posterior subcapsular cataract; ERM epi-retinal membrane; PVD posterior vitreous detachment; RD retinal detachment; DM diabetes mellitus; DR diabetic retinopathy; NPDR non-proliferative diabetic retinopathy; PDR proliferative diabetic retinopathy; CSME clinically significant macular edema; DME diabetic macular edema; dbh dot blot hemorrhages; CWS cotton wool spot; POAG primary open angle glaucoma; C/D cup-to-disc ratio; HVF humphrey visual field; GVF goldmann visual field; OCT optical coherence tomography; IOP intraocular pressure; BRVO Branch retinal vein occlusion; CRVO central retinal vein occlusion; CRAO central retinal artery occlusion; BRAO branch retinal artery occlusion; RT retinal tear; SB scleral buckle; PPV pars plana vitrectomy; VH Vitreous hemorrhage; PRP panretinal laser photocoagulation; IVK intravitreal kenalog ; VMT vitreomacular traction; MH Macular hole;  NVD neovascularization of the disc; NVE neovascularization elsewhere; AREDS age related eye disease study; ARMD age related macular degeneration; POAG primary open angle glaucoma; EBMD epithelial/anterior basement membrane dystrophy; ACIOL  anterior chamber intraocular lens;  IOL intraocular lens; PCIOL posterior chamber intraocular lens; Phaco/IOL phacoemulsification with intraocular lens placement; PRK photorefractive keratectomy; LASIK laser assisted in situ keratomileusis; HTN hypertension; DM diabetes mellitus; COPD chronic obstructive pulmonary disease

## 2024-02-07 ENCOUNTER — Encounter (INDEPENDENT_AMBULATORY_CARE_PROVIDER_SITE_OTHER): Admitting: Ophthalmology

## 2024-02-07 DIAGNOSIS — E113313 Type 2 diabetes mellitus with moderate nonproliferative diabetic retinopathy with macular edema, bilateral: Secondary | ICD-10-CM

## 2024-02-07 DIAGNOSIS — H2513 Age-related nuclear cataract, bilateral: Secondary | ICD-10-CM

## 2024-02-07 DIAGNOSIS — Z794 Long term (current) use of insulin: Secondary | ICD-10-CM

## 2024-02-07 DIAGNOSIS — H04123 Dry eye syndrome of bilateral lacrimal glands: Secondary | ICD-10-CM

## 2024-02-07 DIAGNOSIS — Z7984 Long term (current) use of oral hypoglycemic drugs: Secondary | ICD-10-CM

## 2024-02-07 DIAGNOSIS — I10 Essential (primary) hypertension: Secondary | ICD-10-CM

## 2024-02-07 DIAGNOSIS — H35033 Hypertensive retinopathy, bilateral: Secondary | ICD-10-CM

## 2024-02-14 ENCOUNTER — Other Ambulatory Visit: Payer: Self-pay

## 2024-02-14 ENCOUNTER — Ambulatory Visit: Admitting: Physical Medicine and Rehabilitation

## 2024-02-14 VITALS — BP 135/77 | HR 88

## 2024-02-14 DIAGNOSIS — M47816 Spondylosis without myelopathy or radiculopathy, lumbar region: Secondary | ICD-10-CM

## 2024-02-14 MED ORDER — METHYLPREDNISOLONE ACETATE 40 MG/ML IJ SUSP
40.0000 mg | Freq: Once | INTRAMUSCULAR | Status: AC
  Administered 2024-02-14: 40 mg

## 2024-02-14 NOTE — Progress Notes (Signed)
 Pain Scale   Average Pain 7 Patient advising she has lower back pain radiating to right side pain increasing when standing and walking. Heat does help with pain.        +Driver, -BT, -Dye Allergies.

## 2024-02-15 NOTE — Progress Notes (Signed)
 "  Ana Long - 48 y.o. female MRN [id number]  Date of birth: 1975-08-31  Office Visit Note: Visit Date: 02/14/2024 PCP: Rena Luke POUR, MD Referred by: Rena Luke POUR, MD  Subjective: Chief Complaint  Patient presents with   Lower Back - Pain   HPI:  Ana Long is a 48 y.o. female who comes in today at the request of Duwaine Pouch, FNP for planned Right  L4-5 Lumbar facet/medial branch block with fluoroscopic guidance.  The patient has failed conservative care including home exercise, medications, time and activity modification.  This injection will be diagnostic and hopefully therapeutic.  Please see requesting physician notes for further details and justification.  Exam has shown concordant pain with facet joint loading.   ROS Otherwise per HPI.  Assessment & Plan: Visit Diagnoses:    ICD-10-CM   1. Spondylosis without myelopathy or radiculopathy, lumbar region  M47.816 XR C-ARM NO REPORT    Facet Injection    methylPREDNISolone  acetate (DEPO-MEDROL ) injection 40 mg      Plan: No additional findings.   Meds & Orders:  Meds ordered this encounter  Medications   methylPREDNISolone  acetate (DEPO-MEDROL ) injection 40 mg    Orders Placed This Encounter  Procedures   Facet Injection   XR C-ARM NO REPORT    Follow-up: Return for visit to requesting provider as needed.   Procedures: No procedures performed  Lumbar Facet Joint Intra-Articular Injection(s) with Fluoroscopic Guidance  Patient: Ana Long      Date of Birth: Jan 18, 1976 MRN: [id number] PCP: Rena Luke POUR, MD      Visit Date: 02/14/2024   Universal Protocol:    Date/Time: 02/14/2024  Consent Given By: the patient  Position: PRONE   Additional Comments: Vital signs were monitored before and after the procedure. Patient was prepped and draped in the usual sterile fashion. The correct patient, procedure, and site was verified.   Injection Procedure Details:  Procedure Site  One Meds Administered:  Meds ordered this encounter  Medications   methylPREDNISolone  acetate (DEPO-MEDROL ) injection 40 mg     Laterality: Right  Location/Site:  L4-L5  Needle size: 22 guage  Needle type: Spinal  Needle Placement: Articular  Findings:  -Comments: Excellent flow of contrast producing a partial arthrogram.  Procedure Details: The fluoroscope beam is vertically oriented in AP, and the inferior recess is visualized beneath the lower pole of the inferior apophyseal process, which represents the target point for needle insertion. When direct visualization is difficult the target point is located at the medial projection of the vertebral pedicle. The region overlying each aforementioned target is locally anesthetized with a 1 to 2 ml. volume of 1% Lidocaine  without Epinephrine .   The spinal needle was inserted into each of the above mentioned facet joints using biplanar fluoroscopic guidance. A 0.25 to 0.5 ml. volume of Isovue-250 was injected and a partial facet joint arthrogram was obtained. A single spot film was obtained of the resulting arthrogram.    One to 1.25 ml of the steroid/anesthetic solution was then injected into each of the facet joints noted above.   Additional Comments:  The patient tolerated the procedure well Dressing: 2 x 2 sterile gauze and Band-Aid    Post-procedure details: Patient was observed during the procedure. Post-procedure instructions were reviewed.  Patient left the clinic in stable condition.    Clinical History: Narrative & Impression CLINICAL DATA:  Low back pain. Evaluate degree of osteoarthritis present. Chronic bilateral back pain with right-sided sciatica. No  previous relevant surgery.   EXAM: CT LUMBAR SPINE WITHOUT CONTRAST   TECHNIQUE: Multidetector CT imaging of the lumbar spine was performed without intravenous contrast administration. Multiplanar CT image reconstructions were also generated.   RADIATION  DOSE REDUCTION: This exam was performed according to the departmental dose-optimization program which includes automated exposure control, adjustment of the mA and/or kV according to patient size and/or use of iterative reconstruction technique.   COMPARISON:  Lumbar spine radiographs 06/30/2023. Lumbar MRI 03/13/2008.   FINDINGS: Segmentation: There are 5 lumbar type vertebral bodies.   Alignment: Normal.   Vertebrae: No evidence of acute fracture, traumatic subluxation or pars defect. Multilevel facet arthropathy, most advanced at L4-5 where there is associated vacuum phenomena bilaterally. Mild sacroiliac degenerative changes bilaterally.   Paraspinal and other soft tissues: No significant paraspinal findings.   Disc levels: Vacuum disc phenomenon with mild disc bulging and loss of disc height at T11-12. No significant disc space findings from T12-L1 through L2-3. At L3-4, there is preserved disc height with mild disc bulging and mild facet hypertrophy. No resulting spinal stenosis or foraminal narrowing. At L4-5, there is preserved disc height with mild disc bulging and advanced bilateral facet hypertrophy. No significant spinal stenosis or foraminal narrowing demonstrated. At L5-S1, there is preserved disc height with mild disc bulging and mild bilateral facet hypertrophy.   IMPRESSION: 1. Mild lumbar disc degeneration with preserved disc height and disc bulging as described. Disc desiccation is most advanced at T11-12. 2. Multilevel facet arthropathy, most advanced at L4-5. 3. No significant spinal stenosis or foraminal narrowing demonstrated. 4. No acute osseous findings.     Electronically Signed   By: Elsie Perone M.D.   On: 07/26/2023 15:18     Objective:  VS:  HT:    WT:   BMI:     BP:135/77  HR:88bpm  TEMP: ( )  RESP:  Physical Exam Vitals and nursing note reviewed.  Constitutional:      General: She is not in acute distress.    Appearance:  Normal appearance. She is not ill-appearing.  HENT:     Head: Normocephalic and atraumatic.     Right Ear: External ear normal.     Left Ear: External ear normal.  Eyes:     Extraocular Movements: Extraocular movements intact.  Cardiovascular:     Rate and Rhythm: Normal rate.     Pulses: Normal pulses.  Pulmonary:     Effort: Pulmonary effort is normal. No respiratory distress.  Abdominal:     General: There is no distension.     Palpations: Abdomen is soft.  Musculoskeletal:        General: Tenderness present.     Cervical back: Neck supple.     Right lower leg: No edema.     Left lower leg: No edema.     Comments: Patient has good distal strength with no pain over the greater trochanters.  No clonus or focal weakness.  Skin:    Findings: No erythema, lesion or rash.  Neurological:     General: No focal deficit present.     Mental Status: She is alert and oriented to person, place, and time.     Sensory: No sensory deficit.     Motor: No weakness or abnormal muscle tone.     Coordination: Coordination normal.  Psychiatric:        Mood and Affect: Mood normal.        Behavior: Behavior normal.      Imaging:  XR C-ARM NO REPORT Result Date: 02/14/2024 Please see Notes tab for imaging impression.  "

## 2024-02-15 NOTE — Procedures (Signed)
 Lumbar Facet Joint Intra-Articular Injection(s) with Fluoroscopic Guidance  Patient: Ana Long      Date of Birth: 1975/07/08 MRN: [id number] PCP: Rena Luke POUR, MD      Visit Date: 02/14/2024   Universal Protocol:    Date/Time: 02/14/2024  Consent Given By: the patient  Position: PRONE   Additional Comments: Vital signs were monitored before and after the procedure. Patient was prepped and draped in the usual sterile fashion. The correct patient, procedure, and site was verified.   Injection Procedure Details:  Procedure Site One Meds Administered:  Meds ordered this encounter  Medications   methylPREDNISolone  acetate (DEPO-MEDROL ) injection 40 mg     Laterality: Right  Location/Site:  L4-L5  Needle size: 22 guage  Needle type: Spinal  Needle Placement: Articular  Findings:  -Comments: Excellent flow of contrast producing a partial arthrogram.  Procedure Details: The fluoroscope beam is vertically oriented in AP, and the inferior recess is visualized beneath the lower pole of the inferior apophyseal process, which represents the target point for needle insertion. When direct visualization is difficult the target point is located at the medial projection of the vertebral pedicle. The region overlying each aforementioned target is locally anesthetized with a 1 to 2 ml. volume of 1% Lidocaine  without Epinephrine .   The spinal needle was inserted into each of the above mentioned facet joints using biplanar fluoroscopic guidance. A 0.25 to 0.5 ml. volume of Isovue-250 was injected and a partial facet joint arthrogram was obtained. A single spot film was obtained of the resulting arthrogram.    One to 1.25 ml of the steroid/anesthetic solution was then injected into each of the facet joints noted above.   Additional Comments:  The patient tolerated the procedure well Dressing: 2 x 2 sterile gauze and Band-Aid    Post-procedure details: Patient was observed  during the procedure. Post-procedure instructions were reviewed.  Patient left the clinic in stable condition.

## 2024-03-06 NOTE — Progress Notes (Signed)
 " [facility] - Clinic Note  03/14/2024     CHIEF COMPLAINT Patient presents for Retina Follow Up  HISTORY OF PRESENT ILLNESS: Ana Long is a 49 y.o. female who presents to the clinic today for:   HPI     Retina Follow Up   Patient presents with  Diabetic Retinopathy.  In both eyes.  This started 10 weeks ago.  Severity is moderate.  Duration of 6 years.  Since onset it is stable.  I, the attending physician,  performed the HPI with the patient and updated documentation appropriately.        Comments   Pt denies any changes in vision. Pt states left eye has been irritated and watering constantly. Pt denies FOL/floaters/pain. Pt has had swollen tear ducts due to seasonal allergies.  A1c=11.3, 12/28/2023      Last edited by Ana Rogue, MD on 03/14/2024 12:24 PM.    Pt states she's doing well, had a great birthday. Vision seems stable but OS feels like sand paper and drains a lot  Referring physician: Octavia Charlie Hamilton MD [address] [place], [place] [address]  HISTORICAL INFORMATION:   Selected notes from the MEDICAL RECORD NUMBER Re-referred by Dr. Hamilton Ana for DM exam   CURRENT MEDICATIONS: Current Outpatient Medications (Ophthalmic Drugs)  Medication Sig   Olopatadine HCl 0.2 % SOLN Place one drop into both eyes daily.   No current facility-administered medications for this visit. (Ophthalmic Drugs)   Current Outpatient Medications (Other)  Medication Sig   acyclovir  (ZOVIRAX ) 400 MG tablet TAKE 1 TABLET BY MOUTH TWICE A DAY   albuterol  (PROAIR  HFA) 108 (90 Base) MCG/ACT inhaler Inhale 2 puffs into the lungs every 4 (four) hours as needed for wheezing or shortness of breath (cough). (Patient taking differently: Inhale 2 puffs into the lungs every 4 (four) hours as needed for wheezing or shortness of breath (or coughing).)   atorvastatin  (LIPITOR) 10 MG tablet Take 10 mg by mouth daily.   benzonatate  (TESSALON ) 200 MG capsule Take 1  capsule (200 mg total) by mouth 3 (three) times daily as needed for cough.   Blood Glucose Monitoring Suppl (ACCU-CHEK AVIVA PLUS) w/Device KIT Please check you blood sugars 4 times a day   CIPRODEX OTIC suspension Place into the right ear.   cyclobenzaprine  (FLEXERIL ) 10 MG tablet Take 1 tablet (10 mg total) by mouth 3 (three) times daily as needed for muscle spasms.   DERMA-SMOOTHE/FS SCALP 0.01 % OIL Apply topically.   diazepam  (VALIUM ) 5 MG tablet Take one tablet by mouth with light food one hour prior to procedure.   diclofenac  (VOLTAREN ) 75 MG EC tablet Take 1 tablet (75 mg total) by mouth 2 (two) times daily.   fluconazole  (DIFLUCAN ) 150 MG tablet TAKE 1 TABLET BY MOUTH NOW. THEN REPEAT IN 1 WEEK   furosemide  (LASIX ) 20 MG tablet Take 20 mg by mouth daily as needed for fluid or edema.   glucose blood (ACCU-CHEK SMARTVIEW) test strip TEST FOUR TIMES DAILY   HUMULIN  R U-500 KWIKPEN 500 UNIT/ML kwikpen Inject into the skin.   hydrochlorothiazide (HYDRODIURIL) 12.5 MG tablet Take by mouth.   Insulin  Pen Needle (B-D UF III MINI PEN NEEDLES) 31G X 5 MM MISC USE AS DIRECTED TO INJECT INSULIN  FIVE A DAY E11.65   lactulose  (CEPHULAC ) 10 g packet Take 1 packet (10 g total) by mouth daily as needed. Reported on 07/29/2015 (Patient taking differently: Take 10 g by mouth daily  as needed (for constipation).)   Lancets (ACCU-CHEK SOFT TOUCH) lancets Use as instructed   loratadine  (CLARITIN ) 10 MG tablet Take 1 tablet (10 mg total) by mouth 2 (two) times daily.   mometasone -formoterol  (DULERA ) 200-5 MCG/ACT AERO Inhale 2 puffs into the lungs 2 (two) times daily.   montelukast  (SINGULAIR ) 10 MG tablet Take 1 tablet (10 mg total) by mouth at bedtime.   nitroGLYCERIN  (NITRODUR - DOSED IN MG/24 HR) 0.2 mg/hr patch Apply 1/4th patch to affected achilles, change daily   omeprazole  (PRILOSEC) 20 MG capsule Take 1 capsule (20 mg total) by mouth daily before breakfast.   predniSONE  (DELTASONE ) 10 MG tablet 6 tabs po  day 1, 5 tabs po day 2, 4 tabs po day 3, 3 tabs po day 4, 2 tabs po day 5, 1 tab po day 6   SPIRIVA RESPIMAT 1.25 MCG/ACT AERS SMARTSIG:2 Puff(s) Via Inhaler Daily   sulfamethoxazole -trimethoprim  (BACTRIM  DS) 800-160 MG tablet Take 1 tablet by mouth 2 (two) times daily.   topiramate  (TOPAMAX ) 25 MG tablet Take by mouth.   traMADol  (ULTRAM ) 50 MG tablet Take 1 tablet (50 mg total) by mouth every 6 (six) hours as needed.   Current Facility-Administered Medications (Other)  Medication Route   diphenhydrAMINE  (BENADRYL ) injection 12.5 mg Intramuscular   REVIEW OF SYSTEMS: ROS   Positive for: Endocrine, Eyes, Respiratory Negative for: Constitutional, Gastrointestinal, Neurological, Skin, Genitourinary, Musculoskeletal, HENT, Cardiovascular, Psychiatric, Allergic/Imm, Heme/Lymph Last edited by Ana Long, COT on 03/14/2024 10:03 AM.      ALLERGIES Allergies  Allergen Reactions   Bee Venom Anaphylaxis and Swelling   Wasp Venom Anaphylaxis and Swelling   Citrus Hives and Itching   Latex Hives and Swelling   Penicillins Hives and Itching    Has patient had a PCN reaction causing immediate rash, facial/tongue/throat swelling, SOB or lightheadedness with hypotension: Yes Has patient had a PCN reaction causing severe rash involving mucus membranes or skin necrosis: Unk Has patient had a PCN reaction that required hospitalization: Was already in hosp Has patient had a PCN reaction occurring within the last 10 years: No If all of the above answers are NO, then may proceed with Cephalosporin use.    Shellfish Allergy Other (See Comments) and Swelling    Patient is uncertain; stated that she tolerates this (??)   Soap Hives, Itching and Swelling    PUREX LAUNDRY DETERGENT   Tomato Hives and Itching   Banana Itching and Nausea Only   Chocolate Itching   Fluticasone  Other (See Comments)    Per patient, makes sneeze and gag   Hydrocodone  Itching and Nausea Only   Other Itching     Acidic foods- Itching [place]- Itching in the roof of the mouth   Adhesive [Tape] Itching and Rash    Allergic to  leads   Doxycycline Other (See Comments)    AFFECTS JOINTS   Liraglutide Nausea Only   Metformin  And Related Nausea Only, Rash, Other (See Comments) and Diarrhea    GI UPSET, also   Red Dye #40 (Allura Red) Itching, Rash and Other (See Comments)    At eye doctor's office, the dilating drops- Skin breaks out, chest turned red, itching, face became swollen   PAST MEDICAL HISTORY Past Medical History:  Diagnosis Date   Acute nonintractable headache 12/26/2017   Arthritis    major joints   Asthma    prn inhaler   Asthma exacerbation 07/25/2013   Asthma in adult, severe persistent, with acute exacerbation (HCC) 01/11/2018  Carpal tunnel syndrome of right wrist 09/2012   Cataract    NS OU   Complication of anesthesia    states is hard to wake up post-op   Constipation 12/30/2014   Diabetes mellitus    intolerant to Metformin    Diabetic retinopathy (HCC)    NPDR OU   Dry skin    hands    Family history of anesthesia complication    mother went into coma during c-section   Genital herpes 01/01/2006   on acyclovir  BID    GERD (gastroesophageal reflux disease)    Herpes genital    Hyperglycemia 12/26/2017   Hypertensive retinopathy    OU   Low back pain 11/22/2012   Lower extremity edema 10/18/2012   Menorrhagia 11/06/2015   Morbid obesity with BMI of 50 to 60 07/23/2009   Obesity hypoventilation syndrome (HCC) 10/30/2012   Obesity, morbid (HCC) 01/10/2014   OSA on CPAP     AHI was on  11-17-12 to 120/hr, titrated to 15 cm with 3 cm EPR/    PCOS (polycystic ovarian syndrome)    Rheumatoid arthritis (HCC)    Right ankle pain 06/19/2015   Right hip pain 09/04/2017   Right shoulder pain 06/01/2017   Seasonal allergies    Sleep apnea, obstructive    uses CPAP nightly - had sleep study 08/22/2007   Past Surgical History:  Procedure Laterality Date   CARPAL TUNNEL RELEASE  Right 10/02/2012   Procedure: RIGHT CARPAL TUNNEL RELEASE ENDOSCOPIC;  Surgeon: Alm DELENA Hummer, MD;  Location: [facility];  Service: Orthopedics;  Laterality: Right;   CHOLECYSTECTOMY N/A 12/02/2014   Procedure: LAPAROSCOPIC CHOLECYSTECTOMY;  Surgeon: Krystal Spinner, MD;  Location: [facility];  Service: General;  Laterality: N/A;   HYSTEROSCOPY WITH D & C  01-08-2002   FAMILY HISTORY Family History  Problem Relation Age of Onset   Other Mother    Diabetes Maternal Aunt    Heart attack Maternal Aunt    Alzheimer's disease Maternal Uncle    Cancer Maternal Uncle        prostate   Diabetes Paternal Aunt    Cancer Paternal Aunt        brain,mouth   Alzheimer's disease Paternal Aunt    Colon polyps Paternal Uncle    Diabetes Paternal Uncle    Diabetes Maternal Grandmother    Diabetes Maternal Grandfather    Leukemia Cousin    Other Other    Obesity Other    Other Other    Colitis Neg Hx    SOCIAL HISTORY Social History   Tobacco Use   Smoking status: Former    Current packs/day: 0.00    Types: Cigarettes    Start date: 05/23/2000    Quit date: 05/24/2010    Years since quitting: 13.8   Smokeless tobacco: Never  Vaping Use   Vaping status: Never Used  Substance Use Topics   Alcohol use: Yes    Alcohol/week: 0.0 standard drinks of alcohol    Comment: socially   Drug use: No       OPHTHALMIC EXAM: Base Eye Exam     Visual Acuity (Snellen - Linear)       Right Left   Dist cc 20/25 -1 20/25 -2   Dist ph cc 20/20 -2 20/20    Correction: Glasses         Tonometry (Tonopen, 9:58 AM)       Right Left   Pressure 19 20  Pupils       Pupils Dark Light Shape React APD   Right PERRL 5 3 Round Brisk None   Left PERRL 5 3 Round Brisk None         Visual Fields       Left Right    Full Full         Extraocular Movement       Right Left    Full, Ortho Full, Ortho         Neuro/Psych     Oriented x3: Yes   Mood/Affect: Normal          Dilation     Both eyes: 1.0% Mydriacyl, 2.5% Phenylephrine @ 9:59 AM           Slit Lamp and Fundus Exam     Slit Lamp Exam       Right Left   Lids/Lashes Dermatochalasis - upper lid, mild Meibomian gland dysfunction Dermatochalasis - upper lid, mild Meibomian gland dysfunction   Conjunctiva/Sclera White and quiet White and quiet   Cornea 2+Punctate epithelial erosions, mild tear film debris Trace Punctate epithelial erosions   Anterior Chamber deep and quiet deep and quiet   Iris round and dilated, No NVI round and dilated   Lens 1+ NS, 1+CC 1+NS, 1+CC   Anterior Vitreous mild vitreous syneresis; PVD; mild vit condensations mild vitreous syneresis, scattered vitreous condensations, silicone oil micro bubbles trapped within vit         Fundus Exam       Right Left   Disc Pink and sharp, +cupping Pink and sharp, +cupping   C/D Ratio 0.7 0.6   Macula good foveal reflex, focal exudates ST macula -- improved, scattered MA - improved, edema and punctate exudates temporal macula -- persistent, focal laser changes inferior macula good foveal reflex, scatttered MA's and exudates, central cystic changes, focal cluster of exudates SN macula -- persistent   Vessels attenuated, Tortuous attenuated, Tortuous   Periphery Attached, scattered MA/exudates greatest inferiorly, faint CWS nasal midzone Attached, scattered MA and exudates greatest posteriorly, white without pressure temporally           Refraction     Wearing Rx       Sphere Cylinder Axis Add   Right -3.75 +1.00 013 -2.25   Left -4.00 +1.00 039 -225.00    Type: Progressive           IMAGING AND PROCEDURES  Imaging and Procedures for @TODAY @  OCT, Retina - OU - Both Eyes       Right Eye Quality was good. Central Foveal Thickness: 215. Progression has been stable. Findings include normal foveal contour, no SRF, intraretinal hyper-reflective material, intraretinal fluid, vitreomacular adhesion  (Persistent IRF/cystic changes temporal macula, scattered punctate IRHM ).   Left Eye Quality was good. Central Foveal Thickness: 215. Progression has been stable. Findings include normal foveal contour, no SRF, intraretinal hyper-reflective material, intraretinal fluid (Focal IRF / IRHM temporal mac, scattered IRHM).   Notes *Images captured and stored on drive  Diagnosis / Impression:  +DME OU OD: Persistent IRF/cystic changes temporal macula, scattered punctate IRHM  OS: Focal IRF / IRHM temporal mac, scattered IRHM  Clinical management:  See below  Abbreviations: NFP - Normal foveal profile. CME - cystoid macular edema. PED - pigment epithelial detachment. IRF - intraretinal fluid. SRF - subretinal fluid. EZ - ellipsoid zone. ERM - epiretinal membrane. ORA - outer retinal atrophy. ORT - outer retinal tubulation. SRHM - subretinal hyper-reflective material  Intravitreal Injection, Pharmacologic Agent - OD - Right Eye       Time Out 03/14/2024. 10:51 AM. Confirmed correct patient, procedure, site, and patient consented.   Anesthesia Topical anesthesia was used. Anesthetic medications included Lidocaine  2%, Proparacaine 0.5%.   Procedure Preparation included 5% betadine to ocular surface, eyelid speculum. A (32g) needle was used.   Injection: 2 mg aflibercept  2 MG/0.05ML   Route: Intravitreal, Site: Right Eye   NDC: Q956576, Lot: [id number], Expiration date: 03/23/2025, Waste: 0 mL   Post-op Post injection exam found visual acuity of at least counting fingers. The patient tolerated the procedure well. There were no complications. The patient received written and verbal post procedure care education. Post injection medications were not given.      Intravitreal Injection, Pharmacologic Agent - OS - Left Eye       Time Out 03/14/2024. 10:51 AM. Confirmed correct patient, procedure, site, and patient consented.   Anesthesia Topical anesthesia was used. Anesthetic  medications included Lidocaine  2%, Proparacaine 0.5%.   Procedure Preparation included 5% betadine to ocular surface, eyelid speculum. A supplied needle was used.   Injection: 6 mg faricimab -svoa 6 MG/0.05ML   Route: Intravitreal, Site: Left Eye   NDC: 49757-903-93, Lot: [id number], Expiration date: 11/21/2024, Waste: 0 mL   Post-op Post injection exam found visual acuity of at least counting fingers. The patient tolerated the procedure well. There were no complications. The patient received written and verbal post procedure care education. Post injection medications were not given.             ASSESSMENT/PLAN:    ICD-10-CM   1. Moderate nonproliferative diabetic retinopathy of both eyes with macular edema associated with type 2 diabetes mellitus (HCC)  E11.3313 OCT, Retina - OU - Both Eyes    Intravitreal Injection, Pharmacologic Agent - OD - Right Eye    Intravitreal Injection, Pharmacologic Agent - OS - Left Eye    faricimab -svoa (VABYSMO ) 6mg /0.79mL intravitreal injection    aflibercept  (EYLEA ) SOLN 2 mg    2. Current use of insulin  (HCC)  Z79.4     3. Long term (current) use of oral hypoglycemic drugs  Z79.84     4. Essential hypertension  I10     5. Hypertensive retinopathy of both eyes  H35.033     6. Nuclear sclerosis of both eyes  H25.13     7. Dry eyes  H04.123      1-3.  Moderate non-proliferative diabetic retinopathy w/ DME, OU  - delayed f/u -- 11.5 wks instead of 4 (10.25.24 to 01.15.25)  - A1c 11.3 on 11.05.25, 10.3 on 06.17.25  - pt lost to f/u from 10.23.2019 to 11.18.20  - FA in 2019 showed leaking MA OU; no NV OU **of note, pt had pruritic rxn to fluorescein  dye -- resolved with 12.5 mg IV benadryl **  - FA 12.16.2020 shows late leaking MA's, non-perfusion defects; no NV OU - s/p IVA OD #1 (12.16.20), #2 (01.25.21), #3 (05.26.21), #4 (07.02.21), #5 (10.15.21), #6 (11.23.21), #7 (07.27.22), #8 (09.09.22), #9 (11.11.22), #10 (01.20.23), #11 (02.23.23), #12  (04.09.23), #13 (05.23.23), #14 (06.30.23), #15 (08.04.23), #16 (09.01.23), #17 (10.06.23), #18 (11.10.23) #19 (12.15.23), #20 (01.19.24), #21 (02.22.24), #22 (04.09.24), #23 (06.28.24), #24 (07.29.24), #25 (09.27.24), #26 (10.25.24), #27 (04.02.25)  - s/p IVA OS #1 (12.23.20), #2 (01.27.21), #3 (03.02.21), #4 (05.26.21), #5 (07.02.21), #6 (10.15.21), #7 (11.23.21), #8 (01.11.22), #9 (03.04.22), #10 (04.27.22), #11 (11.14.22), #12 (01.10.23), #13 (02.20.23), #14 (04.05.23), #15 (05.19.23), #16 (06.23.23), #17 (07.28.23), #18 (09.01.23), #19 (  10.06.23), #20 (11.10.23) #21 (12.15.23), #22 (01.19.24), #23 (02.22.24), #24 (04.01.24), #25 (04.30.24), #26 (05.28.24), #27 (06.28.24) -- IVA Resistance ====================== - s/p IVE OD #1 (05.02.25) #2(05.30.25), #3 (06.27.25), #4 (07.30.25), #5 (09.09.25) #6(10.10.25), #7 (11.11.25)  - s/p IVE OS #1 (07.29.24), #2 (08.30.24), #3 (09.24.24), #4 (10.25.24), #5 (01.15.25)  -- IVE resistance ======================= - s/p IVV OS #1 (02.26.25), #2 (03.26.25), #3 (05.02.25), #4 (05.30.25), #5 (06.27.25), # 6 (08.08.25), #7 (09.11.25) #8 (10.10.25), #9 (11.11.25) - s/p focal laser OD (03.19.21), (04.01.22)  - exam shows scattered MA, IRH, exudates and mild macular edema OU  - BCVA OD 20/20, OS: 20/20 -- stable OU - OCT shows OD: Persistent IRF/cystic changes temporal macula, scattered punctate IRHM; OS: Focal IRF / IRHM temporal mac, scattered IRHM at 10.1 weeks - Recommend IVE OD #8 and IVV OS #10 today, 01.21.26 w/ f/u in 8-10 wks -- for maintenance of vision  - pt wishes to proceed with injection - RBA of procedure discussed, questions answered - see procedure note  - Vabsymo informed consent form was signed and scanned on 02.26.25 (OU)  - Eylea  informed consent form was signed and scanned on 05.02.25 (OD)  - f/u 8-10 weeks - DFE/OCT, possible injxns  4,5. Hypertensive retinopathy OU  - discussed importance of tight BP control             - continue to  monitor  6. Nuclear Sclerosis  - The symptoms of cataract, surgical options, and treatments and risks were discussed with patient.   - discussed diagnosis and progression  - under the expert management of [facility]  7. Dry eyes OU - recommend artificial tears and lubricating ointment as needed  Return for 8-10 weeks NPDR OU, DFE, OCT, likely IVE OD/IVV OS.   Ophthalmic Meds Ordered this visit:  Meds ordered this encounter  Medications   faricimab -svoa (VABYSMO ) 6mg /0.56mL intravitreal injection   aflibercept  (EYLEA ) SOLN 2 mg     This document serves as a record of services personally performed by Redell JUDITHANN Hans, MD, PhD. It was created on their behalf by Almetta Pesa, an ophthalmic technician. The creation of this record is the provider's dictation and/or activities during the visit.    Electronically signed by: Almetta Pesa, OA, 03/14/24  12:26 PM  This document serves as a record of services personally performed by Redell JUDITHANN Hans, MD, PhD. It was created on their behalf by Wanda GEANNIE Keens, COT an ophthalmic technician. The creation of this record is the provider's dictation and/or activities during the visit.    Electronically signed by:  Wanda GEANNIE Keens, COT  03/14/24 12:26 PM  Redell JUDITHANN Hans, M.D., Ph.D. Diseases & Surgery of the Retina and Vitreous [facility]  I have reviewed the above documentation for accuracy and completeness, and I agree with the above. Redell JUDITHANN Hans, M.D., Ph.D. 03/14/24 12:26 PM    Abbreviations: M myopia (nearsighted); A astigmatism; H hyperopia (farsighted); P presbyopia; Mrx spectacle prescription;  CTL contact lenses; OD right eye; OS left eye; OU both eyes  XT exotropia; ET esotropia; PEK punctate epithelial keratitis; PEE punctate epithelial erosions; DES dry eye syndrome; MGD meibomian gland dysfunction; ATs artificial tears; PFAT's preservative free artificial tears; NSC nuclear sclerotic  cataract; PSC posterior subcapsular cataract; ERM epi-retinal membrane; PVD posterior vitreous detachment; RD retinal detachment; DM diabetes mellitus; DR diabetic retinopathy; NPDR non-proliferative diabetic retinopathy; PDR proliferative diabetic retinopathy; CSME clinically significant macular edema; DME diabetic macular edema; dbh dot blot hemorrhages;  CWS cotton wool spot; POAG primary open angle glaucoma; C/D cup-to-disc ratio; HVF humphrey visual field; GVF goldmann visual field; OCT optical coherence tomography; IOP intraocular pressure; BRVO Branch retinal vein occlusion; CRVO central retinal vein occlusion; CRAO central retinal artery occlusion; BRAO branch retinal artery occlusion; RT retinal tear; SB scleral buckle; PPV pars plana vitrectomy; VH Vitreous hemorrhage; PRP panretinal laser photocoagulation; IVK intravitreal kenalog ; VMT vitreomacular traction; MH Macular hole;  NVD neovascularization of the disc; NVE neovascularization elsewhere; AREDS age related eye disease study; ARMD age related macular degeneration; POAG primary open angle glaucoma; EBMD epithelial/anterior basement membrane dystrophy; ACIOL anterior chamber intraocular lens; IOL intraocular lens; PCIOL posterior chamber intraocular lens; Phaco/IOL phacoemulsification with intraocular lens placement; PRK photorefractive keratectomy; LASIK laser assisted in situ keratomileusis; HTN hypertension; DM diabetes mellitus; COPD chronic obstructive pulmonary disease "

## 2024-03-14 ENCOUNTER — Encounter (INDEPENDENT_AMBULATORY_CARE_PROVIDER_SITE_OTHER): Payer: Self-pay | Admitting: Ophthalmology

## 2024-03-14 ENCOUNTER — Ambulatory Visit (INDEPENDENT_AMBULATORY_CARE_PROVIDER_SITE_OTHER): Admitting: Ophthalmology

## 2024-03-14 DIAGNOSIS — E113313 Type 2 diabetes mellitus with moderate nonproliferative diabetic retinopathy with macular edema, bilateral: Secondary | ICD-10-CM

## 2024-03-14 DIAGNOSIS — H35033 Hypertensive retinopathy, bilateral: Secondary | ICD-10-CM

## 2024-03-14 DIAGNOSIS — Z7984 Long term (current) use of oral hypoglycemic drugs: Secondary | ICD-10-CM | POA: Diagnosis not present

## 2024-03-14 DIAGNOSIS — Z794 Long term (current) use of insulin: Secondary | ICD-10-CM | POA: Diagnosis not present

## 2024-03-14 DIAGNOSIS — I10 Essential (primary) hypertension: Secondary | ICD-10-CM | POA: Diagnosis not present

## 2024-03-14 DIAGNOSIS — H2513 Age-related nuclear cataract, bilateral: Secondary | ICD-10-CM

## 2024-03-14 DIAGNOSIS — H04123 Dry eye syndrome of bilateral lacrimal glands: Secondary | ICD-10-CM

## 2024-03-14 MED ORDER — AFLIBERCEPT 2MG/0.05ML IZ SOLN FOR KALEIDOSCOPE
2.0000 mg | INTRAVITREAL | Status: AC | PRN
  Administered 2024-03-14: 2 mg via INTRAVITREAL

## 2024-03-14 MED ORDER — FARICIMAB-SVOA 6 MG/0.05ML IZ SOSY
6.0000 mg | PREFILLED_SYRINGE | INTRAVITREAL | Status: AC | PRN
  Administered 2024-03-14: 6 mg via INTRAVITREAL

## 2024-05-09 ENCOUNTER — Encounter (INDEPENDENT_AMBULATORY_CARE_PROVIDER_SITE_OTHER): Admitting: Ophthalmology
